# Patient Record
Sex: Female | Born: 1940 | Race: Black or African American | Hispanic: No | Smoking: Former smoker
Health system: Southern US, Community
[De-identification: ages and names within clinical notes are randomized; demographics above are authoritative.]

## PROBLEM LIST (undated history)

## (undated) DIAGNOSIS — H269 Unspecified cataract: Secondary | ICD-10-CM

## (undated) DIAGNOSIS — I771 Stricture of artery: Secondary | ICD-10-CM

## (undated) DIAGNOSIS — G894 Chronic pain syndrome: Secondary | ICD-10-CM

## (undated) DIAGNOSIS — M48062 Spinal stenosis, lumbar region with neurogenic claudication: Secondary | ICD-10-CM

## (undated) DIAGNOSIS — N2581 Secondary hyperparathyroidism of renal origin: Secondary | ICD-10-CM

## (undated) DIAGNOSIS — D631 Anemia in chronic kidney disease: Secondary | ICD-10-CM

## (undated) DIAGNOSIS — I5189 Other ill-defined heart diseases: Secondary | ICD-10-CM

## (undated) DIAGNOSIS — I5032 Chronic diastolic (congestive) heart failure: Secondary | ICD-10-CM

## (undated) DIAGNOSIS — I701 Atherosclerosis of renal artery: Secondary | ICD-10-CM

## (undated) DIAGNOSIS — N189 Chronic kidney disease, unspecified: Secondary | ICD-10-CM

## (undated) DIAGNOSIS — I10 Essential (primary) hypertension: Secondary | ICD-10-CM

## (undated) DIAGNOSIS — K219 Gastro-esophageal reflux disease without esophagitis: Secondary | ICD-10-CM

## (undated) DIAGNOSIS — I2510 Atherosclerotic heart disease of native coronary artery without angina pectoris: Secondary | ICD-10-CM

## (undated) DIAGNOSIS — M48061 Spinal stenosis, lumbar region without neurogenic claudication: Secondary | ICD-10-CM

## (undated) DIAGNOSIS — M47812 Spondylosis without myelopathy or radiculopathy, cervical region: Secondary | ICD-10-CM

## (undated) DIAGNOSIS — E785 Hyperlipidemia, unspecified: Secondary | ICD-10-CM

## (undated) DIAGNOSIS — N183 Chronic kidney disease, stage 3 unspecified: Secondary | ICD-10-CM

## (undated) DIAGNOSIS — I6529 Occlusion and stenosis of unspecified carotid artery: Secondary | ICD-10-CM

## (undated) DIAGNOSIS — F419 Anxiety disorder, unspecified: Secondary | ICD-10-CM

## (undated) DIAGNOSIS — R0602 Shortness of breath: Secondary | ICD-10-CM

## (undated) DIAGNOSIS — M169 Osteoarthritis of hip, unspecified: Secondary | ICD-10-CM

## (undated) DIAGNOSIS — Z7902 Long term (current) use of antithrombotics/antiplatelets: Secondary | ICD-10-CM

## (undated) DIAGNOSIS — I739 Peripheral vascular disease, unspecified: Secondary | ICD-10-CM

## (undated) DIAGNOSIS — I714 Abdominal aortic aneurysm, without rupture, unspecified: Secondary | ICD-10-CM

## (undated) DIAGNOSIS — I469 Cardiac arrest, cause unspecified: Secondary | ICD-10-CM

## (undated) HISTORY — DX: Anxiety disorder, unspecified: F41.9

## (undated) HISTORY — DX: Essential (primary) hypertension: I10

## (undated) HISTORY — DX: Chronic kidney disease, stage 3 unspecified: N18.30

## (undated) HISTORY — DX: Secondary hyperparathyroidism of renal origin: N25.81

## (undated) HISTORY — PX: TONSILLECTOMY AND ADENOIDECTOMY: SUR1326

## (undated) HISTORY — DX: Atherosclerotic heart disease of native coronary artery without angina pectoris: I25.10

## (undated) HISTORY — PX: CAROTID ENDARTERECTOMY: SUR193

## (undated) HISTORY — PX: CHOLECYSTECTOMY: SHX55

## (undated) HISTORY — DX: Anemia in chronic kidney disease: N18.9

## (undated) HISTORY — DX: Hyperlipidemia, unspecified: E78.5

## (undated) HISTORY — DX: Stricture of artery: I77.1

## (undated) HISTORY — PX: TOE AMPUTATION: SHX809

## (undated) HISTORY — DX: Occlusion and stenosis of unspecified carotid artery: I65.29

## (undated) HISTORY — DX: Chronic kidney disease, stage 3 (moderate): N18.3

## (undated) HISTORY — DX: Cardiac arrest, cause unspecified: I46.9

## (undated) HISTORY — DX: Anemia in chronic kidney disease: D63.1

## (undated) HISTORY — PX: CAROTID ARTERY ANGIOPLASTY: SHX1300

## (undated) HISTORY — DX: Spinal stenosis, lumbar region without neurogenic claudication: M48.061

## (undated) HISTORY — PX: TOTAL HIP ARTHROPLASTY: SHX124

## (undated) HISTORY — PX: CYSTOSCOPY WITH STENT PLACEMENT: SHX5790

## (undated) HISTORY — DX: Atherosclerosis of renal artery: I70.1

---

## 1974-10-25 HISTORY — PX: ABDOMINAL HYSTERECTOMY: SHX81

## 1998-10-25 HISTORY — PX: JOINT REPLACEMENT: SHX530

## 1998-10-25 HISTORY — PX: CORONARY ANGIOPLASTY WITH STENT PLACEMENT: SHX49

## 1998-10-25 HISTORY — PX: CORONARY ARTERY BYPASS GRAFT: SHX141

## 1999-10-26 HISTORY — PX: LAPAROSCOPIC CHOLECYSTECTOMY: SUR755

## 2000-03-29 DIAGNOSIS — Z951 Presence of aortocoronary bypass graft: Secondary | ICD-10-CM

## 2000-03-29 HISTORY — DX: Presence of aortocoronary bypass graft: Z95.1

## 2000-03-29 HISTORY — PX: CORONARY ARTERY BYPASS GRAFT: SHX141

## 2002-11-19 HISTORY — PX: LEFT HEART CATH AND CORS/GRAFTS ANGIOGRAPHY: CATH118250

## 2003-09-09 HISTORY — PX: CAROTID ENDARTERECTOMY: SUR193

## 2003-09-15 ENCOUNTER — Other Ambulatory Visit: Payer: Self-pay

## 2003-09-16 ENCOUNTER — Other Ambulatory Visit: Payer: Self-pay

## 2003-09-17 HISTORY — PX: LEFT HEART CATH AND CORS/GRAFTS ANGIOGRAPHY: CATH118250

## 2003-10-26 HISTORY — PX: TOTAL HIP ARTHROPLASTY: SHX124

## 2004-12-14 ENCOUNTER — Ambulatory Visit: Payer: Self-pay | Admitting: *Deleted

## 2005-03-17 ENCOUNTER — Other Ambulatory Visit: Payer: Self-pay

## 2005-03-17 ENCOUNTER — Emergency Department: Payer: Self-pay | Admitting: Unknown Physician Specialty

## 2005-06-24 ENCOUNTER — Ambulatory Visit (HOSPITAL_COMMUNITY): Admission: RE | Admit: 2005-06-24 | Discharge: 2005-06-24 | Payer: Self-pay | Admitting: Cardiovascular Disease

## 2005-12-23 ENCOUNTER — Other Ambulatory Visit: Payer: Self-pay

## 2005-12-23 ENCOUNTER — Emergency Department: Payer: Self-pay | Admitting: Unknown Physician Specialty

## 2006-10-07 ENCOUNTER — Ambulatory Visit: Payer: Self-pay | Admitting: Internal Medicine

## 2007-06-22 ENCOUNTER — Ambulatory Visit: Payer: Self-pay | Admitting: Gastroenterology

## 2007-12-28 ENCOUNTER — Ambulatory Visit: Payer: Self-pay | Admitting: Internal Medicine

## 2008-01-17 ENCOUNTER — Ambulatory Visit: Payer: Self-pay | Admitting: Internal Medicine

## 2008-02-24 ENCOUNTER — Other Ambulatory Visit: Payer: Self-pay

## 2008-02-24 ENCOUNTER — Emergency Department: Payer: Self-pay | Admitting: Emergency Medicine

## 2008-03-25 ENCOUNTER — Ambulatory Visit: Payer: Self-pay | Admitting: Vascular Surgery

## 2008-05-30 ENCOUNTER — Ambulatory Visit: Payer: Self-pay | Admitting: Unknown Physician Specialty

## 2008-05-30 ENCOUNTER — Other Ambulatory Visit: Payer: Self-pay

## 2008-06-06 ENCOUNTER — Inpatient Hospital Stay: Payer: Self-pay | Admitting: Unknown Physician Specialty

## 2008-07-08 ENCOUNTER — Ambulatory Visit: Payer: Self-pay | Admitting: Physician Assistant

## 2008-08-12 ENCOUNTER — Ambulatory Visit: Payer: Self-pay | Admitting: Internal Medicine

## 2009-03-13 ENCOUNTER — Ambulatory Visit: Payer: Self-pay | Admitting: Internal Medicine

## 2009-06-13 ENCOUNTER — Ambulatory Visit: Payer: Self-pay | Admitting: Internal Medicine

## 2010-06-11 ENCOUNTER — Emergency Department: Payer: Self-pay | Admitting: Emergency Medicine

## 2010-07-14 ENCOUNTER — Ambulatory Visit: Payer: Self-pay | Admitting: Internal Medicine

## 2010-08-20 ENCOUNTER — Ambulatory Visit: Payer: Self-pay | Admitting: Vascular Surgery

## 2011-03-11 ENCOUNTER — Ambulatory Visit: Payer: Self-pay | Admitting: Internal Medicine

## 2011-03-25 ENCOUNTER — Ambulatory Visit: Payer: Self-pay | Admitting: Pain Medicine

## 2011-04-02 ENCOUNTER — Ambulatory Visit: Payer: Self-pay | Admitting: Pain Medicine

## 2011-08-10 ENCOUNTER — Ambulatory Visit: Payer: Self-pay | Admitting: Internal Medicine

## 2012-01-30 ENCOUNTER — Observation Stay: Payer: Self-pay | Admitting: Internal Medicine

## 2012-01-30 LAB — COMPREHENSIVE METABOLIC PANEL
Albumin: 3.9 g/dL (ref 3.4–5.0)
Alkaline Phosphatase: 79 U/L (ref 50–136)
Anion Gap: 6 — ABNORMAL LOW (ref 7–16)
BUN: 19 mg/dL — ABNORMAL HIGH (ref 7–18)
Bilirubin,Total: 0.5 mg/dL (ref 0.2–1.0)
Calcium, Total: 9.2 mg/dL (ref 8.5–10.1)
Chloride: 98 mmol/L (ref 98–107)
Co2: 32 mmol/L (ref 21–32)
Creatinine: 1.23 mg/dL (ref 0.60–1.30)
EGFR (African American): 56 — ABNORMAL LOW
EGFR (Non-African Amer.): 46 — ABNORMAL LOW
Glucose: 91 mg/dL (ref 65–99)
Osmolality: 274 (ref 275–301)
Potassium: 3.7 mmol/L (ref 3.5–5.1)
SGOT(AST): 31 U/L (ref 15–37)
SGPT (ALT): 22 U/L
Sodium: 136 mmol/L (ref 136–145)
Total Protein: 8.2 g/dL (ref 6.4–8.2)

## 2012-01-30 LAB — CBC
HCT: 34.8 % — ABNORMAL LOW (ref 35.0–47.0)
HGB: 11.7 g/dL — ABNORMAL LOW (ref 12.0–16.0)
MCH: 29.1 pg (ref 26.0–34.0)
MCHC: 33.5 g/dL (ref 32.0–36.0)
MCV: 87 fL (ref 80–100)
Platelet: 284 10*3/uL (ref 150–440)
RBC: 4.02 10*6/uL (ref 3.80–5.20)
RDW: 13.7 % (ref 11.5–14.5)
WBC: 7.5 10*3/uL (ref 3.6–11.0)

## 2012-01-30 LAB — TROPONIN I
Troponin-I: 0.05 ng/mL
Troponin-I: 0.05 ng/mL
Troponin-I: 0.04 ng/mL

## 2012-01-30 LAB — CK TOTAL AND CKMB (NOT AT ARMC)
CK, Total: 218 U/L — ABNORMAL HIGH (ref 21–215)
CK, Total: 185 U/L (ref 21–215)
CK-MB: 2.5 ng/mL (ref 0.5–3.6)
CK-MB: 1.8 ng/mL (ref 0.5–3.6)

## 2012-05-08 ENCOUNTER — Emergency Department: Payer: Self-pay | Admitting: Emergency Medicine

## 2012-06-02 ENCOUNTER — Ambulatory Visit: Payer: Self-pay | Admitting: Neurology

## 2012-08-22 ENCOUNTER — Ambulatory Visit: Payer: Self-pay | Admitting: Internal Medicine

## 2013-08-23 ENCOUNTER — Ambulatory Visit: Payer: Self-pay | Admitting: Internal Medicine

## 2013-10-25 HISTORY — PX: TOTAL HIP ARTHROPLASTY: SHX124

## 2013-11-06 ENCOUNTER — Ambulatory Visit: Payer: Self-pay | Admitting: Vascular Surgery

## 2013-11-06 LAB — CREATININE, SERUM
Creatinine: 1.06 mg/dL (ref 0.60–1.30)
EGFR (African American): >60
EGFR (Non-African Amer.): 53 — ABNORMAL LOW

## 2013-11-06 LAB — BUN: BUN: 15 mg/dL (ref 7–18)

## 2013-12-18 ENCOUNTER — Ambulatory Visit: Payer: Self-pay | Admitting: Adult Health

## 2013-12-18 ENCOUNTER — Telehealth: Payer: Self-pay | Admitting: Adult Health

## 2013-12-18 NOTE — Telephone Encounter (Signed)
Wrong dob  Pt r/s to 3/5

## 2013-12-18 NOTE — Telephone Encounter (Signed)
Snowing, patient is unsure if she can make it safely to the office. She does not know exactly where office is located and she does not feel like she could drive in the snow and find the office also. Please contact her at number provided to reschedule the appointment.

## 2013-12-23 HISTORY — PX: RENAL ARTERY ANGIOPLASTY: SHX2317

## 2013-12-25 ENCOUNTER — Ambulatory Visit: Payer: Self-pay | Admitting: Vascular Surgery

## 2013-12-25 LAB — BASIC METABOLIC PANEL
Anion Gap: 3 — ABNORMAL LOW (ref 7–16)
BUN: 15 mg/dL (ref 7–18)
Calcium, Total: 9.1 mg/dL (ref 8.5–10.1)
Chloride: 104 mmol/L (ref 98–107)
Co2: 31 mmol/L (ref 21–32)
Creatinine: 1.34 mg/dL — ABNORMAL HIGH (ref 0.60–1.30)
EGFR (African American): 46 — ABNORMAL LOW
EGFR (Non-African Amer.): 40 — ABNORMAL LOW
Glucose: 111 mg/dL — ABNORMAL HIGH (ref 65–99)
Osmolality: 277 (ref 275–301)
Potassium: 3.8 mmol/L (ref 3.5–5.1)
Sodium: 138 mmol/L (ref 136–145)

## 2013-12-27 ENCOUNTER — Encounter: Payer: Self-pay | Admitting: Adult Health

## 2013-12-27 ENCOUNTER — Ambulatory Visit (INDEPENDENT_AMBULATORY_CARE_PROVIDER_SITE_OTHER): Payer: Medicare Other | Admitting: Adult Health

## 2013-12-27 ENCOUNTER — Encounter (INDEPENDENT_AMBULATORY_CARE_PROVIDER_SITE_OTHER): Payer: Self-pay

## 2013-12-27 VITALS — BP 152/76 | HR 87 | Temp 98.6°F | Resp 16 | Ht 61.0 in | Wt 169.0 lb

## 2013-12-27 DIAGNOSIS — M25552 Pain in left hip: Secondary | ICD-10-CM

## 2013-12-27 DIAGNOSIS — Z23 Encounter for immunization: Secondary | ICD-10-CM

## 2013-12-27 DIAGNOSIS — M25551 Pain in right hip: Secondary | ICD-10-CM

## 2013-12-27 DIAGNOSIS — M5417 Radiculopathy, lumbosacral region: Secondary | ICD-10-CM | POA: Insufficient documentation

## 2013-12-27 DIAGNOSIS — M25559 Pain in unspecified hip: Secondary | ICD-10-CM

## 2013-12-27 DIAGNOSIS — M545 Low back pain, unspecified: Secondary | ICD-10-CM

## 2013-12-27 DIAGNOSIS — I10 Essential (primary) hypertension: Secondary | ICD-10-CM | POA: Insufficient documentation

## 2013-12-27 DIAGNOSIS — I359 Nonrheumatic aortic valve disorder, unspecified: Secondary | ICD-10-CM

## 2013-12-27 DIAGNOSIS — I358 Other nonrheumatic aortic valve disorders: Secondary | ICD-10-CM

## 2013-12-27 MED ORDER — HYDROCODONE-ACETAMINOPHEN 5-325 MG PO TABS: 1.0000 | ORAL_TABLET | Freq: Four times a day (QID) | ORAL | Status: DC | PRN

## 2013-12-27 NOTE — Progress Notes (Signed)
Pre visit review using our clinic review tool, if applicable. No additional management support is needed unless otherwise documented below in the visit note. 

## 2013-12-27 NOTE — Patient Instructions (Addendum)
   Thank you for choosing [place] at [facility] for your health care needs.  Monitor your blood pressure in the morning and write it down.  Please go to our [facility] office for your xrays at your earliest convenience. I am sending you for an xray of your lumbar spine and both hips.  You were given the pneumonia vaccine today (Prevnar).  Norco 1 tablet every 6 hours as needed for low back pain, hip pain  Return in 2 weeks.

## 2013-12-28 ENCOUNTER — Ambulatory Visit (INDEPENDENT_AMBULATORY_CARE_PROVIDER_SITE_OTHER)
Admission: RE | Admit: 2013-12-28 | Discharge: 2013-12-28 | Disposition: A | Discharge status: Home / Self Care | Payer: Medicare Other | Source: Ambulatory Visit | Attending: Adult Health | Admitting: Adult Health

## 2013-12-28 ENCOUNTER — Telehealth: Payer: Self-pay | Admitting: *Deleted

## 2013-12-28 ENCOUNTER — Ambulatory Visit
Admission: RE | Admit: 2013-12-28 | Discharge: 2013-12-28 | Disposition: A | Discharge status: Home / Self Care | Payer: Medicare Other | Source: Ambulatory Visit | Attending: Adult Health | Admitting: Adult Health

## 2013-12-28 ENCOUNTER — Encounter: Payer: Self-pay | Admitting: Adult Health

## 2013-12-28 ENCOUNTER — Other Ambulatory Visit: Payer: Self-pay | Admitting: Adult Health

## 2013-12-28 ENCOUNTER — Telehealth: Payer: Self-pay | Admitting: Adult Health

## 2013-12-28 DIAGNOSIS — M25551 Pain in right hip: Secondary | ICD-10-CM

## 2013-12-28 DIAGNOSIS — I358 Other nonrheumatic aortic valve disorders: Secondary | ICD-10-CM | POA: Insufficient documentation

## 2013-12-28 DIAGNOSIS — I359 Nonrheumatic aortic valve disorder, unspecified: Secondary | ICD-10-CM | POA: Insufficient documentation

## 2013-12-28 DIAGNOSIS — M25552 Pain in left hip: Secondary | ICD-10-CM

## 2013-12-28 DIAGNOSIS — M25559 Pain in unspecified hip: Secondary | ICD-10-CM

## 2013-12-28 DIAGNOSIS — Z23 Encounter for immunization: Secondary | ICD-10-CM | POA: Insufficient documentation

## 2013-12-28 DIAGNOSIS — M545 Low back pain, unspecified: Secondary | ICD-10-CM

## 2013-12-28 NOTE — Telephone Encounter (Signed)
Pt states when she had her hydrocodone filled, the pharmacist asked her if her doctor was aware that she was using the Fentanyl patch. Pt states the pharmacist did not tell her to not take the hydrocodone, but she is worried to take it since he did say something about it. Wants to confirm it is ok to use the hydrocodone while on patch.

## 2013-12-28 NOTE — Telephone Encounter (Signed)
Yes I am aware she is on the Duragesic patch. The vicodin is 1 tablet every 6 hours as needed for pain. Duragesic is a long acting pain medication but you can still experience pain called breakthrough pain. If she does not have any pain she is not to take the vicodin. I gave her the prescription because she was having hip and back pain.

## 2013-12-28 NOTE — Progress Notes (Signed)
Patient ID: Sabrina [name], female   DOB: 07/10/1941, 73 y.o.   MRN: [id number]    Subjective:    Patient ID: Sabrina [name], female    DOB: 1940/12/22, 73 y.o.   MRN: [id number]  HPI  Ms Sabrina [name] is a pleasant 73 y/o female who presents to clinic to establish care. She was previously followed by Dr. Brynda Greathouse. Also followed by Dr. Clayborn Bigness for cardiology. She has several concerns that she would like to address. She has a hx of HTN, renal artery stenosis s/p bilateral stent placement. She reports taking too many blood pressure medications and would like to come off some. She reports poor circulation in B. LE which results in painful legs. 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 Reports increasing pain of her hips with the right side being worse than the left. Denies having a bone scan of films of her lumbar spine to evaluate potential cause of her pain. She is on a Duragesic patch 50 mcg from chronic pain but would like to be weaned off this patch.       Past Medical History  Diagnosis Date  . Anxiety   . Hypertension   . Hyperlipidemia   . Renal artery stenosis   . Carotid artery stenosis      Past Surgical History  Procedure Laterality Date  . Cholecystectomy    . Tonsillectomy and adenoidectomy    . Abdominal hysterectomy  1976  . Total hip arthroplasty Left   . Carotid artery angioplasty Left   . Coronary angioplasty with stent placement  2000  . Toe amputation Right     small toe  . Cystoscopy with stent placement Bilateral      Family History  Problem Relation Age of Onset  . Stroke Mother   . Hypertension Mother   . Diabetes Mother   . Hypertension Father   . Heart disease Sister     MI     History   Social History  . Marital Status: Married    Spouse Name: N/A    Number of Children: N/A  . Years of Education: N/A   Occupational History  . Not on file.   Social History Main Topics  . Smoking status: Former Smoker    Quit  date: 10/25/1998  . Smokeless tobacco: Never Used  . Alcohol Use: No  . Drug Use: No  . Sexual Activity: Not on file   Other Topics Concern  . Not on file   Social History Narrative  . No narrative on file        No current outpatient prescriptions on file prior to visit.   No current facility-administered medications on file prior to visit.     Review of Systems  Constitutional: Negative.   HENT: Negative.   Eyes: Negative.   Respiratory: Positive for shortness of breath (on exertion).   Cardiovascular: Positive for leg swelling.  Gastrointestinal: Negative.   Endocrine: Negative.   Genitourinary: Negative.   Musculoskeletal: Positive for arthralgias (bilateral hip pain R>L) and back pain.  Skin: Negative.   Allergic/Immunologic: Negative.   Neurological: Negative.   Hematological: Negative.   Psychiatric/Behavioral: Negative.        Objective:  BP 152/76  Pulse 87  Temp(Src) 98.6 F (37 C) (Oral)  Resp 16  Ht 5\' 1"  (1.549 m)  Wt 169 lb (76.658 kg)  BMI 31.95 kg/m2  SpO2 93%   Physical Exam  Constitutional: She is oriented to person, place, and  time. She appears well-developed and well-nourished. No distress.  HENT:  Head: Normocephalic and atraumatic.  Eyes: Conjunctivae and EOM are normal.  Neck: Normal range of motion. Neck supple. No tracheal deviation present.  Cardiovascular: Normal rate, regular rhythm and intact distal pulses.  Exam reveals no gallop and no friction rub.   Murmur heard. Systolic Murmur - suspect aortic stenosis  Pulmonary/Chest: Effort normal and breath sounds normal. No respiratory distress. She has no wheezes. She has no rales.  Musculoskeletal: She exhibits tenderness. She exhibits no edema.  Low back tenderness. Hip pain bilaterally. Pt's gait is guarded. Appears uncomfortable  Neurological: She is alert and oriented to person, place, and time.  Skin: Skin is warm and dry.  Psychiatric: She has a normal mood and affect.  Her behavior is normal. Judgment and thought content normal.       Assessment & Plan:   1. HTN (hypertension) RAS s/p bil stents. She is very upset about having to take multiple meds for b/p. Discussed that with recent renal stents her blood pressure may improve and we will be able to address this.  2. Hip pain, bilateral No injury reported. Has chronic back pain. Will have her go for hip xray. Start vicodin 1 every 6 hours as needed for pain. She is on Duragesic patch for chronic pain. - DG Hip Bilateral W/Pelvis; Future  3. Low back pain Reports no previous films of low back. Send for lumbar spine xray. She has not had bone density either. Will order this in the not to distant future. - DG Lumbar Spine Complete; Future  4. Aortic systolic murmur on examination Suspect AS. May further be contributing to her HTN. Request records from Dr. Clayborn Bigness  5. Need for vaccination with 13-polyvalent pneumococcal conjugate vaccine Prevnar given today - Pneumococcal conjugate vaccine 13-valent

## 2013-12-28 NOTE — Telephone Encounter (Signed)
emmi report mailed to patient ° °

## 2013-12-28 NOTE — Telephone Encounter (Signed)
Pt came by office on the way to her xray,  Pt was advised by Lorriane Shire of Raquel's response.

## 2014-01-04 ENCOUNTER — Other Ambulatory Visit: Payer: Self-pay | Admitting: Adult Health

## 2014-01-04 DIAGNOSIS — M25551 Pain in right hip: Secondary | ICD-10-CM

## 2014-01-10 ENCOUNTER — Encounter: Payer: Self-pay | Admitting: Adult Health

## 2014-01-10 ENCOUNTER — Ambulatory Visit (INDEPENDENT_AMBULATORY_CARE_PROVIDER_SITE_OTHER): Payer: Medicare Other | Admitting: Adult Health

## 2014-01-10 VITALS — BP 132/84 | HR 98 | Temp 98.8°F | Resp 14 | Wt 161.0 lb

## 2014-01-10 DIAGNOSIS — Z23 Encounter for immunization: Secondary | ICD-10-CM | POA: Diagnosis not present

## 2014-01-10 DIAGNOSIS — M25551 Pain in right hip: Secondary | ICD-10-CM

## 2014-01-10 DIAGNOSIS — M25559 Pain in unspecified hip: Secondary | ICD-10-CM

## 2014-01-10 DIAGNOSIS — M25552 Pain in left hip: Secondary | ICD-10-CM

## 2014-01-10 DIAGNOSIS — R899 Unspecified abnormal finding in specimens from other organs, systems and tissues: Secondary | ICD-10-CM

## 2014-01-10 DIAGNOSIS — R6889 Other general symptoms and signs: Secondary | ICD-10-CM | POA: Diagnosis not present

## 2014-01-10 DIAGNOSIS — I10 Essential (primary) hypertension: Secondary | ICD-10-CM

## 2014-01-10 MED ORDER — CLOPIDOGREL BISULFATE 75 MG PO TABS: 75.0000 mg | ORAL_TABLET | Freq: Once | ORAL | Status: DC

## 2014-01-10 MED ORDER — HYDROCODONE-ACETAMINOPHEN 5-325 MG PO TABS: 1.0000 | ORAL_TABLET | Freq: Four times a day (QID) | ORAL | Status: DC | PRN

## 2014-01-10 MED ORDER — FENTANYL 50 MCG/HR TD PT72: 50.0000 ug | MEDICATED_PATCH | TRANSDERMAL | Status: DC

## 2014-01-10 MED ORDER — CLONIDINE HCL 0.1 MG PO TABS: 0.1000 mg | ORAL_TABLET | Freq: Three times a day (TID) | ORAL | Status: DC

## 2014-01-10 MED ORDER — EZETIMIBE 10 MG PO TABS: 10.0000 mg | ORAL_TABLET | Freq: Every day | ORAL | Status: DC

## 2014-01-10 MED ORDER — ESOMEPRAZOLE MAGNESIUM 40 MG PO CPDR: 40.0000 mg | DELAYED_RELEASE_CAPSULE | Freq: Every day | ORAL | Status: DC

## 2014-01-10 NOTE — Patient Instructions (Addendum)
  Your blood pressure has improved significantly.  Hydralazine 50 mg - take this blood pressure medication 3 times a day as needed for systolic pressure above Q000111Q.  You received your tetanus/pertussis vaccine today.  I have sent refills to your pharmacy.  I have decreased the dose of your Fentanyl Patch to 50 mcg every 72 hours (3 days). I am trying to wean you off this medication as you requested. If your pain is not well controlled then we will go back to the 75 mcg.  I have also given you a prescription for Hydrocodone for pain. You can take 1 tablet every 6 hours as needed for pain.

## 2014-01-10 NOTE — Progress Notes (Signed)
Pre visit review using our clinic review tool, if applicable. No additional management support is needed unless otherwise documented below in the visit note. 

## 2014-01-10 NOTE — Progress Notes (Signed)
Patient ID: Sabrina [name], female   DOB: [birth year], 73 y.o.   MRN: [id number]   Subjective:    Patient ID: Wilmoth A Amos, female    DOB: 1941/10/20, 73 y.o.   MRN: [id number]  HPI  Pt is a pleasant 73 year old female who recently established care in clinic who presents for follow up for HTN. She recently underwent stenting of renal artery stenosis. She brings her blood pressure readings for the past 2 weeks and they are significantly improved and improving.  B/P Readings: 148/68  135/64  108/62  138/70 152/76  138/71  107/76  117/69 146/78  151/81  113/76  117/62 118/71  147/58  135/78  121/63  She is feeling well. Reports that she feels less sleepy during the day. She also reports that her hip pain is better controlled with the hydrocodone. She has been taking approximately 2 tablets daily. She has a pending referral to ortho.  She had abnormal TSH result back in 03/2013. Will recheck levels today  Past Medical History  Diagnosis Date  . CAD (coronary artery disease)   . Hypertension   . Hyperlipidemia   . Renal artery stenosis   . Carotid artery stenosis   . Anxiety   . 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 Subclavian arterial stenosis     Current Outpatient Prescriptions on File Prior to Visit  Medication Sig Dispense Refill  . amLODipine (NORVASC) 5 MG tablet Take 5 mg by mouth daily.       Marland Kitchen aspirin 81 MG tablet Take 81 mg by mouth daily.      . chlordiazePOXIDE (LIBRIUM) 25 MG capsule Take 25 mg by mouth every 12 (twelve) hours.       . cilostazol (PLETAL) 100 MG tablet Take 100 mg by mouth 2 (two) times daily.       . hydrALAZINE (APRESOLINE) 50 MG tablet Take 50 mg by mouth 3 (three) times daily.       Marland Kitchen labetalol (NORMODYNE) 200 MG tablet Take 200 mg by mouth once.      . Multiple Vitamin (MULTIVITAMIN WITH MINERALS) TABS tablet Take 1 tablet by mouth daily.      . Omega-3 Fatty Acids (FISH OIL) 1000 MG CAPS Take 1 capsule by mouth daily.       . traMADol (ULTRAM) 50 MG tablet Take 50 mg by mouth every 6 (six) hours as needed.       No current facility-administered medications on file prior to visit.     Review of Systems  Constitutional: Negative.   Respiratory: Negative.  Negative for chest tightness, shortness of breath and wheezing.   Cardiovascular: Negative.  Negative for chest pain and leg swelling.  Gastrointestinal: Negative.   Musculoskeletal:       Hip pain improved  Neurological: Negative.   Psychiatric/Behavioral: Negative.        Objective:  BP 132/84  Pulse 98  Temp(Src) 98.8 F (37.1 C) (Oral)  Resp 14  Wt 161 lb (73.029 kg)  SpO2 92%   Physical Exam  Constitutional: She is oriented to person, place, and time. No distress.  HENT:  Head: Normocephalic and atraumatic.  Cardiovascular: Normal rate, regular rhythm and normal heart sounds.  Exam reveals no gallop and no friction rub.   No murmur heard. Pulmonary/Chest: Effort normal and breath sounds normal. No respiratory distress. She has no wheezes. She has no rales.  Musculoskeletal: She exhibits tenderness.  Gait has improved. Pain better  controlled.  Neurological: She is alert and oriented to person, place, and time.  Skin: Skin is warm and dry.  Psychiatric: She has a normal mood and affect. Her behavior is normal. Judgment and thought content normal.       Assessment & Plan:   1. HTN (hypertension) Better controlled. We discussed using the hydralazine for systolic above Q000111Q. Having had stent for renal artery stenosis has also contributed to her improved b/p. She brought her b/p machine from home to compare against our machine and it is compatible. Check labs. Continue to follow. Follow up 3 months.  - Basic metabolic panel  2. Hip pain, bilateral She has appt with ortho. Pain has improved with Norco. Refill medication  3. Need for Tdap vaccination Administered today  - Tdap vaccine greater than or equal to 7yo IM  4. Abnormal  laboratory test Check TSH which was slightly elevated back in June 2014. - TSH

## 2014-01-11 ENCOUNTER — Encounter: Payer: Self-pay | Admitting: Emergency Medicine

## 2014-01-11 LAB — BASIC METABOLIC PANEL
BUN: 15 mg/dL (ref 6–23)
CO2: 31 mEq/L (ref 19–32)
Calcium: 9.7 mg/dL (ref 8.4–10.5)
Chloride: 97 mEq/L (ref 96–112)
Creatinine, Ser: 1.4 mg/dL — ABNORMAL HIGH (ref 0.4–1.2)
GFR: 48.75 mL/min — ABNORMAL LOW (ref >60.00)
Glucose, Bld: 78 mg/dL (ref 70–99)
Potassium: 4 mEq/L (ref 3.5–5.1)
Sodium: 135 mEq/L (ref 135–145)

## 2014-01-11 LAB — TSH: TSH: 5.74 u[IU]/mL — ABNORMAL HIGH (ref 0.35–5.50)

## 2014-01-15 ENCOUNTER — Other Ambulatory Visit: Payer: Self-pay | Admitting: Adult Health

## 2014-01-15 ENCOUNTER — Encounter: Payer: Self-pay | Admitting: Adult Health

## 2014-01-15 DIAGNOSIS — R7989 Other specified abnormal findings of blood chemistry: Secondary | ICD-10-CM

## 2014-01-21 ENCOUNTER — Encounter: Payer: Self-pay | Admitting: Emergency Medicine

## 2014-01-22 ENCOUNTER — Telehealth: Payer: Self-pay | Admitting: Adult Health

## 2014-01-22 NOTE — Telephone Encounter (Signed)
The patient is wanting labs done to check her cholesterol .

## 2014-01-23 NOTE — Telephone Encounter (Signed)
Advised pt, and she was ok with having the cholesterol checked at her next appt

## 2014-01-23 NOTE — Telephone Encounter (Signed)
She had her cholesterol done recently by her previous doctor and it was not bad. I did not order it this last visit. She will have that checked next visit.

## 2014-01-24 ENCOUNTER — Telehealth: Payer: Self-pay | Admitting: Adult Health

## 2014-01-24 NOTE — Telephone Encounter (Signed)
Pt left vm.  Needs help with referral appointments that have been scheduled.

## 2014-01-29 NOTE — Telephone Encounter (Signed)
Pt aware of all the apts scheduled.

## 2014-02-01 ENCOUNTER — Emergency Department: Payer: Self-pay | Admitting: Emergency Medicine

## 2014-02-07 ENCOUNTER — Telehealth: Payer: Self-pay | Admitting: Adult Health

## 2014-02-07 NOTE — Telephone Encounter (Signed)
States she saw Dr. Juleen China.    Wants to know if she had something done for her kidney then why did they find something in her heart.  States she was told that she has a narrowing of an artery in her heart and was been referred to see Dr. Clayborn Bigness.   States she has not heard back about an appointment to Dr. Clayborn Bigness and she has called their office with no answer and no return call.    States she is to have surgery on her hip and this is a hold up.    States she wants to know the results of her lipid profile.  Says she received results of what she had done here in the office but [facility]quel did not do certain tests because she had them done somewhere else.  Would like to get a copy of her labs from Dr. Juleen China and a copy of her lipid profile results from whoever did them.    Forgot whether she is to stop one of her blood pressure pills.  States she does not want to do anything unless Raquel tells her to.

## 2014-02-08 ENCOUNTER — Telehealth: Payer: Self-pay | Admitting: Adult Health

## 2014-02-08 NOTE — Telephone Encounter (Signed)
Pt stated she is having surgery may 26.  She stated she needed to see her heart dr and wanted to see if you could get her a referral to that appointment Dr Clayborn Bigness Please call pt with date and time

## 2014-02-08 NOTE — Telephone Encounter (Signed)
Pt came in today  Wanting to let you know that she is changing her pharmacy To  [facility] on [address]

## 2014-02-09 ENCOUNTER — Other Ambulatory Visit: Payer: Self-pay | Admitting: Adult Health

## 2014-02-09 DIAGNOSIS — I771 Stricture of artery: Secondary | ICD-10-CM

## 2014-02-09 NOTE — Telephone Encounter (Signed)
I spoke with Sabrina [name] over the telephone. She was very nervous not fully understanding what she needed to do following the visit with [facility] where they told her she needed an intervention for subclavian artery stenosis. Spoke with her at length. Explained the findings and placed referral for Dr. Clayborn Bigness who is her cardiologist. She is concerned because she is having hip surgery at the end of May and needs this addressed prior. Pt thanked me for the call and time spent.

## 2014-02-11 ENCOUNTER — Telehealth: Payer: Self-pay | Admitting: Emergency Medicine

## 2014-02-11 NOTE — Telephone Encounter (Signed)
Patient stated her head is hurting, thinking its due to her BP being up and down. Please advise

## 2014-02-11 NOTE — Telephone Encounter (Signed)
Spoke with pt. At 01/10/14 visit, pt was advised to take Hydralazine tid prn for systolic > Q000111Q. Since that time, her BPs have been increasing. When she takes the Hydralazine, it will lower her BP for 2-3 hours, then be elevated again. Readings for yesterday: 7:15 AM 178/82, took hydralazine, one hour after BP 130/72. At 1:00 PM 192/89, took Hydralazine, at 2:30 167/82, then 5:25 PM BP 139/76. BP this AM was 158/82, has not taken Hydralazine yet today, will recheck her BP in 1 hour. Confirmed she is still taking Amlodipine 5 mg daily, Clonidine 0.1 mg tid, and labetalol 200 mg at bedtime. Pt denies chest pain, states she does have a daily headache, this has been occuring since Fentanyl patch was reduced in March. Takes 1/2 hydrocodone when she has pain.

## 2014-02-11 NOTE — Telephone Encounter (Signed)
Pt notified and verbalized understanding.

## 2014-02-11 NOTE — Telephone Encounter (Signed)
Please have pt take the hydralazine tid. Maybe she can stagger it with the clonidine so that she doesn't take them at the same time. She is also anxious about the appointment with Dr. Clayborn Bigness and her upcoming hip surgery which may be driving her blood pressure up.

## 2014-02-13 ENCOUNTER — Ambulatory Visit (INDEPENDENT_AMBULATORY_CARE_PROVIDER_SITE_OTHER): Payer: Medicare Other | Admitting: Adult Health

## 2014-02-13 ENCOUNTER — Encounter: Payer: Self-pay | Admitting: Adult Health

## 2014-02-13 VITALS — BP 150/92 | HR 69 | Temp 98.5°F | Resp 14 | Wt 158.0 lb

## 2014-02-13 DIAGNOSIS — R51 Headache: Secondary | ICD-10-CM

## 2014-02-13 MED ORDER — FENTANYL 50 MCG/HR TD PT72: 50.0000 ug | MEDICATED_PATCH | TRANSDERMAL | Status: DC

## 2014-02-13 MED ORDER — TRAMADOL HCL 50 MG PO TABS: 50.0000 mg | ORAL_TABLET | Freq: Four times a day (QID) | ORAL | Status: DC | PRN

## 2014-02-13 MED ORDER — CLOPIDOGREL BISULFATE 75 MG PO TABS: 75.0000 mg | ORAL_TABLET | Freq: Once | ORAL | Status: DC

## 2014-02-13 MED ORDER — OXYCODONE HCL 5 MG PO TABS: 5.0000 mg | ORAL_TABLET | Freq: Three times a day (TID) | ORAL | Status: DC | PRN

## 2014-02-13 MED ORDER — CHLORDIAZEPOXIDE HCL 25 MG PO CAPS: 25.0000 mg | ORAL_CAPSULE | Freq: Every day | ORAL | Status: DC | PRN

## 2014-02-13 NOTE — Progress Notes (Signed)
Pre visit review using our clinic review tool, if applicable. No additional management support is needed unless otherwise documented below in the visit note. 

## 2014-02-13 NOTE — Progress Notes (Signed)
   Subjective:    Patient ID: Sabrina [name], female    DOB: [birth year], 73 y.o.   MRN: [id number]  Headache    Pt is a pleasant 73 y/o female who presents to clinic with headache. She was recently started on hydrocodone for breakthrough pain of her hip. She requested to decrease the duragesic patch from 75 mcg to 50 mcg and therefore we added the Norco to help with her pain. She thought that the headache was coming from her blood pressure but this has been better controlled. She has an appointment with Dr. Clayborn Bigness on Tuesday to evaluate whether patient needs intervention of the subclavian artery. She is scheduled for hip surgery in May. Pt is nervous about all that is going on.  Past Medical History  Diagnosis Date  . CAD (coronary artery disease)   . Hypertension   . Hyperlipidemia   . Renal artery stenosis   . Carotid artery stenosis   . Anxiety   . 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 Subclavian arterial stenosis     Review of Systems  Respiratory: Negative.   Cardiovascular: Negative for chest pain, palpitations and leg swelling.  Genitourinary: Negative.   Musculoskeletal: Negative.   Neurological: Positive for headaches.  Psychiatric/Behavioral: Negative for behavioral problems, confusion and agitation. The patient is nervous/anxious.        Objective:   Physical Exam  Constitutional: She is oriented to person, place, and time. She appears well-developed and well-nourished. No distress.  Cardiovascular: Normal rate and regular rhythm.  Exam reveals no gallop.   Murmur heard. Pulmonary/Chest: Effort normal and breath sounds normal. No respiratory distress. She has no wheezes. She has no rales.  Musculoskeletal:  Considerable hip pain. Rate 6/10 - 8/10  Neurological: She is alert and oriented to person, place, and time.  Psychiatric: She has a normal mood and affect. Her behavior is normal. Judgment and thought content normal.       Assessment &  Plan:   1. Headache(784.0) Suspect that her headaches are coming from hydrocodone which is a common side effect that affects some people. Given her ongoing pain of the hip we will keep the duragesic at 50 mcg and add oxycodone every 8 hours as needed for BTP.  Note, greater than 30 min were spent in addressing her headache and discussing a change to her pain management.

## 2014-02-15 ENCOUNTER — Telehealth: Payer: Self-pay | Admitting: Adult Health

## 2014-02-15 NOTE — Telephone Encounter (Signed)
Pt came in to office asking to give a message to [place]:   Do not change drug stores until she can call back up here.  When she was here the other day she told us to call on Friday but she has decided to stay with [facility] until further notice.  [facility] and [facility] and see what the price difference is.  Please continue to use [facility] for the time being.

## 2014-03-04 ENCOUNTER — Other Ambulatory Visit: Payer: Self-pay | Admitting: Adult Health

## 2014-03-04 MED ORDER — LABETALOL HCL 200 MG PO TABS: 200.0000 mg | ORAL_TABLET | Freq: Two times a day (BID) | ORAL | Status: DC

## 2014-03-07 ENCOUNTER — Encounter: Payer: Self-pay | Admitting: Adult Health

## 2014-03-07 ENCOUNTER — Other Ambulatory Visit: Payer: Self-pay | Admitting: Adult Health

## 2014-03-07 MED ORDER — LISINOPRIL 10 MG PO TABS: 10.0000 mg | ORAL_TABLET | Freq: Every day | ORAL | Status: DC

## 2014-03-14 ENCOUNTER — Telehealth: Payer: Self-pay | Admitting: Adult Health

## 2014-03-14 NOTE — Telephone Encounter (Signed)
Patient stopped by office to request a refill on a rx.- Fentanyl Patch. Patient stated that she has already spoken with the pharmacy and she was told that she had to call the doctor.

## 2014-03-14 NOTE — Telephone Encounter (Signed)
Patient requesting Rx refill for Fentanyl patches. Last Rx printed 02/13/14. Please advice.

## 2014-03-14 NOTE — Telephone Encounter (Signed)
Patient also requesting a 3 month supply. Please advise.

## 2014-03-15 ENCOUNTER — Other Ambulatory Visit: Payer: Self-pay | Admitting: Adult Health

## 2014-03-15 MED ORDER — FENTANYL 50 MCG/HR TD PT72
50.0000 ug | MEDICATED_PATCH | TRANSDERMAL | Status: DC
Start: 2014-03-15 — End: 2014-04-29

## 2014-03-15 MED ORDER — FENTANYL 50 MCG/HR TD PT72: 50.0000 ug | MEDICATED_PATCH | TRANSDERMAL | Status: DC

## 2014-03-15 NOTE — Telephone Encounter (Signed)
Patient notified Rx ready for pick up. Patient verbalized understanding.

## 2014-03-15 NOTE — Progress Notes (Signed)
Duragesic prescriptions x 3 (3 months)

## 2014-03-15 NOTE — Telephone Encounter (Signed)
Please call pt to pick up prescriptions

## 2014-03-27 ENCOUNTER — Ambulatory Visit: Payer: Self-pay | Admitting: Orthopedic Surgery

## 2014-03-27 LAB — CBC
HCT: 32.9 % — ABNORMAL LOW (ref 35.0–47.0)
HGB: 11.1 g/dL — ABNORMAL LOW (ref 12.0–16.0)
MCH: 28.7 pg (ref 26.0–34.0)
MCHC: 33.7 g/dL (ref 32.0–36.0)
MCV: 85 fL (ref 80–100)
Platelet: 258 10*3/uL (ref 150–440)
RBC: 3.86 10*6/uL (ref 3.80–5.20)
RDW: 13.7 % (ref 11.5–14.5)
WBC: 6.5 10*3/uL (ref 3.6–11.0)

## 2014-03-27 LAB — BASIC METABOLIC PANEL
Anion Gap: 4 — ABNORMAL LOW (ref 7–16)
BUN: 19 mg/dL — ABNORMAL HIGH (ref 7–18)
Calcium, Total: 9.4 mg/dL (ref 8.5–10.1)
Chloride: 104 mmol/L (ref 98–107)
Co2: 29 mmol/L (ref 21–32)
Creatinine: 1.34 mg/dL — ABNORMAL HIGH (ref 0.60–1.30)
EGFR (African American): 46 — ABNORMAL LOW
EGFR (Non-African Amer.): 39 — ABNORMAL LOW
Glucose: 79 mg/dL (ref 65–99)
Osmolality: 275 (ref 275–301)
Potassium: 4 mmol/L (ref 3.5–5.1)
Sodium: 137 mmol/L (ref 136–145)

## 2014-03-27 LAB — URINALYSIS, COMPLETE
Bilirubin,UR: NEGATIVE
Glucose,UR: 50 mg/dL (ref 0–75)
Ketone: NEGATIVE
Leukocyte Esterase: NEGATIVE
Nitrite: NEGATIVE
Ph: 5 (ref 4.5–8.0)
Protein: NEGATIVE
RBC,UR: NONE SEEN /HPF (ref 0–5)
Specific Gravity: 1.006 (ref 1.003–1.030)
Squamous Epithelial: <1
WBC UR: 1 /HPF (ref 0–5)

## 2014-03-27 LAB — PROTIME-INR
INR: 1
Prothrombin Time: 12.6 secs (ref 11.5–14.7)

## 2014-03-27 LAB — APTT: Activated PTT: 29.2 secs (ref 23.6–35.9)

## 2014-03-27 LAB — SEDIMENTATION RATE: Erythrocyte Sed Rate: 36 mm/hr — ABNORMAL HIGH (ref 0–30)

## 2014-03-27 LAB — MRSA PCR SCREENING

## 2014-03-28 LAB — URINE CULTURE

## 2014-04-04 DIAGNOSIS — M1611 Unilateral primary osteoarthritis, right hip: Secondary | ICD-10-CM | POA: Insufficient documentation

## 2014-04-08 ENCOUNTER — Telehealth: Payer: Self-pay | Admitting: *Deleted

## 2014-04-08 NOTE — Telephone Encounter (Signed)
Fax received from [facility] requesting Hydralazine HCL 50mg  Tab #90 one tablet TID PRN for BP.  Last refilled by Dr Lujean Amel on 2.4.15.  Last OV at [facility] 4.22.15.  Please advise refill.

## 2014-04-09 ENCOUNTER — Other Ambulatory Visit: Payer: Self-pay | Admitting: Adult Health

## 2014-04-09 MED ORDER — HYDRALAZINE HCL 50 MG PO TABS: 50.0000 mg | ORAL_TABLET | Freq: Three times a day (TID) | ORAL | Status: DC

## 2014-04-09 NOTE — Telephone Encounter (Signed)
Ok to refill 

## 2014-04-09 NOTE — Telephone Encounter (Signed)
Rx approved

## 2014-04-10 ENCOUNTER — Inpatient Hospital Stay: Payer: Self-pay | Admitting: Orthopedic Surgery

## 2014-04-11 LAB — HEMOGLOBIN
HGB: 7.1 g/dL — ABNORMAL LOW (ref 12.0–16.0)
HGB: 7.9 g/dL — ABNORMAL LOW (ref 12.0–16.0)

## 2014-04-11 LAB — BASIC METABOLIC PANEL
Anion Gap: 4 — ABNORMAL LOW (ref 7–16)
BUN: 23 mg/dL — ABNORMAL HIGH (ref 7–18)
Calcium, Total: 8 mg/dL — ABNORMAL LOW (ref 8.5–10.1)
Chloride: 104 mmol/L (ref 98–107)
Co2: 28 mmol/L (ref 21–32)
Creatinine: 1.49 mg/dL — ABNORMAL HIGH (ref 0.60–1.30)
EGFR (African American): 40 — ABNORMAL LOW
EGFR (Non-African Amer.): 35 — ABNORMAL LOW
Glucose: 89 mg/dL (ref 65–99)
Osmolality: 275 (ref 275–301)
Potassium: 4.2 mmol/L (ref 3.5–5.1)
Sodium: 136 mmol/L (ref 136–145)

## 2014-04-11 LAB — PLATELET COUNT: Platelet: 182 10*3/uL (ref 150–440)

## 2014-04-12 ENCOUNTER — Encounter: Payer: Self-pay | Admitting: Internal Medicine

## 2014-04-12 LAB — BASIC METABOLIC PANEL
Anion Gap: 6 — ABNORMAL LOW (ref 7–16)
BUN: 16 mg/dL (ref 7–18)
Calcium, Total: 8.5 mg/dL (ref 8.5–10.1)
Chloride: 101 mmol/L (ref 98–107)
Co2: 27 mmol/L (ref 21–32)
Creatinine: 1.12 mg/dL (ref 0.60–1.30)
EGFR (African American): 57 — ABNORMAL LOW
EGFR (Non-African Amer.): 49 — ABNORMAL LOW
Glucose: 90 mg/dL (ref 65–99)
Osmolality: 269 (ref 275–301)
Potassium: 4.4 mmol/L (ref 3.5–5.1)
Sodium: 134 mmol/L — ABNORMAL LOW (ref 136–145)

## 2014-04-12 LAB — TSH: Thyroid Stimulating Horm: 1.34 u[IU]/mL

## 2014-04-12 LAB — HEMATOCRIT: HCT: 24.7 % — ABNORMAL LOW (ref 35.0–47.0)

## 2014-04-12 LAB — PATHOLOGY REPORT

## 2014-04-12 LAB — HEMOGLOBIN
HGB: 8 g/dL — ABNORMAL LOW (ref 12.0–16.0)
HGB: 8.3 g/dL — ABNORMAL LOW (ref 12.0–16.0)
HGB: 7.7 g/dL — ABNORMAL LOW (ref 12.0–16.0)

## 2014-04-13 DIAGNOSIS — Z96649 Presence of unspecified artificial hip joint: Secondary | ICD-10-CM | POA: Diagnosis not present

## 2014-04-13 DIAGNOSIS — M25559 Pain in unspecified hip: Secondary | ICD-10-CM | POA: Diagnosis not present

## 2014-04-13 LAB — HEMOGLOBIN: HGB: 9.2 g/dL — ABNORMAL LOW (ref 12.0–16.0)

## 2014-04-29 ENCOUNTER — Encounter: Payer: Self-pay | Admitting: Adult Health

## 2014-04-29 ENCOUNTER — Ambulatory Visit (INDEPENDENT_AMBULATORY_CARE_PROVIDER_SITE_OTHER): Payer: Medicare Other | Admitting: Adult Health

## 2014-04-29 VITALS — BP 138/70 | HR 88 | Temp 98.5°F | Resp 16 | Wt 146.2 lb

## 2014-04-29 DIAGNOSIS — I10 Essential (primary) hypertension: Secondary | ICD-10-CM

## 2014-04-29 MED ORDER — FENTANYL 50 MCG/HR TD PT72: 50.0000 ug | MEDICATED_PATCH | TRANSDERMAL | Status: DC

## 2014-04-29 NOTE — Progress Notes (Signed)
Pre visit review using our clinic review tool, if applicable. No additional management support is needed unless otherwise documented below in the visit note. 

## 2014-04-29 NOTE — Progress Notes (Signed)
Subjective:    Patient ID: Sabrina [name], female    DOB: Mar 05, 1941, 73 y.o.   MRN: [id number]  HPI  Pleasant 73 yo female with a history of CAD, HTN, hyperlipidemia, renal artery stenosis s/p stent placement and recent right hip replacement presents today to discuss adjustments to her blood pressure medications. Indicates that her blood pressures have been good, even running low. Presented her blood pressure log with readings in the Q000111Q systolic and [id number] diastolic. There were a couple of spikes of 160 noted. Denies any changes in vision.   Had right total hip arthroplasty since last visit and indicates she has been doing well. Pain is much better controlled with nucynta.    Past Medical History  Diagnosis Date  . CAD (coronary artery disease)   . Hypertension   . Hyperlipidemia   . Renal artery stenosis   . Carotid artery stenosis   . Anxiety   . 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 Subclavian arterial stenosis   . Chronic kidney disease, stage III (moderate)   . Anemia of chronic kidney failure   . Secondary hyperparathyroidism     Past Surgical History  Procedure Laterality Date  . Cholecystectomy    . Tonsillectomy and adenoidectomy    . Abdominal hysterectomy  1976  . Total hip arthroplasty Left   . Carotid artery angioplasty Left   . Coronary angioplasty with stent placement  2000  . Toe amputation Right     small toe  . Cystoscopy with stent placement Bilateral   . Total hip arthroplasty Right 2015    Current Outpatient Prescriptions on File Prior to Visit  Medication Sig Dispense Refill  . aspirin 81 MG tablet Take 81 mg by mouth daily.      . chlordiazePOXIDE (LIBRIUM) 25 MG capsule Take 1 capsule (25 mg total) by mouth daily as needed for anxiety.  30 capsule  5  . cilostazol (PLETAL) 100 MG tablet Take 100 mg by mouth 2 (two) times daily.       . clopidogrel (PLAVIX) 75 MG tablet Take 1 tablet (75 mg total) by mouth once.  30  tablet  6  . esomeprazole (NEXIUM) 40 MG capsule Take 1 capsule (40 mg total) by mouth daily.  30 capsule  6  . ezetimibe (ZETIA) 10 MG tablet Take 1 tablet (10 mg total) by mouth daily.  30 tablet  6  . lisinopril (PRINIVIL,ZESTRIL) 10 MG tablet Take 1 tablet (10 mg total) by mouth daily.  30 tablet  3  . Multiple Vitamin (MULTIVITAMIN WITH MINERALS) TABS tablet Take 1 tablet by mouth daily.      . Omega-3 Fatty Acids (FISH OIL) 1000 MG CAPS Take 1 capsule by mouth daily.       No current facility-administered medications on file prior to visit.     Review of Systems  Constitutional: Negative.   HENT: Negative.   Eyes: Negative.   Respiratory: Negative.   Cardiovascular: Negative.   Gastrointestinal: Negative.   Endocrine: Negative.   Genitourinary: Negative.   Musculoskeletal: Negative.   Allergic/Immunologic: Negative.   Neurological: Negative.   Hematological: Negative.   Psychiatric/Behavioral: Negative.        Objective:  BP 138/70  Pulse 88  Temp(Src) 98.5 F (36.9 C) (Oral)  Resp 16  Wt 146 lb 4 oz (66.339 kg)  SpO2 97%   Physical Exam  Constitutional: She is oriented to person, place, and time. She appears well-developed and  well-nourished. No distress.  HENT:  Head: Normocephalic.  Cardiovascular: Normal rate and regular rhythm.   Murmur (aortic stenosis) heard. Pulmonary/Chest: Effort normal and breath sounds normal.  Neurological: She is alert and oriented to person, place, and time.  Skin: Skin is warm and dry.  Psychiatric: She has a normal mood and affect. Her behavior is normal. Judgment and thought content normal.     Assessment & Plan:   1. Essential hypertension Blood pressure average is below goal of <150/90 and actually running low (see HPI). Discontinue Amlodipine and clonidine. Reduce Labetolol to 100 mg BID. Continue lisinopril 10 mg daily and hydralazine 50 mg PO TID PRN. Has follow up appointment with Cardiology on 7/28. New medication  list printed and discussed with patient. Questions answered and patient indicated she understood. Note, greater than 30 min spent in face to face communication  reviewing patient's b/p log, medications, pt's history and determining changes to her blood pressure medication and in education pertaining to the changes. Follow up in 3 months.  - labetalol (NORMODYNE) 200 MG tablet; Take 100 mg by mouth 2 (two) times daily. - hydrALAZINE (APRESOLINE) 50 MG tablet; Take 50 mg by mouth 3 (three) times daily as needed.

## 2014-04-29 NOTE — Patient Instructions (Signed)
  You are doing well.  I have provided you with a prescription for Fentanyl Patch.  Changes to your blood pressure medication as we discussed and according to your new medication list which was provided to you.  Monitor your blood pressure daily and as needed.  Please call with any questions or concerns.

## 2014-04-30 ENCOUNTER — Telehealth: Payer: Self-pay | Admitting: Adult Health

## 2014-04-30 ENCOUNTER — Encounter: Payer: Self-pay | Admitting: Adult Health

## 2014-04-30 NOTE — Telephone Encounter (Signed)
Relevant patient education assigned to patient using Emmi. ° °

## 2014-05-07 ENCOUNTER — Telehealth: Payer: Self-pay | Admitting: Adult Health

## 2014-05-07 NOTE — Telephone Encounter (Signed)
Pt wanted to let Raquel that she is sweating a lot. Pt transferred to triage rn/msn

## 2014-05-08 NOTE — Telephone Encounter (Signed)
Please advise in Raquel's absence.

## 2014-05-08 NOTE — Telephone Encounter (Signed)
I cannot respond without more information.  It is a hot summer ,  Why does she consider sweating to be a medical problem?

## 2014-05-09 NOTE — Telephone Encounter (Signed)
Spoke with pt, she states she is not outside when this occurs.  She states when she gets up in the mornings she is unable to walk from her bedroom to the kitchen without sweating.  The only change is the

## 2014-05-09 NOTE — Telephone Encounter (Signed)
this can wait until raquel  returns bc she will need to be seen .  If she IS having chest pain associated with the sweating she needs to go to ER

## 2014-05-09 NOTE — Telephone Encounter (Signed)
Th only change is the Nucynta.  Please advise.

## 2014-06-09 ENCOUNTER — Telehealth: Payer: Self-pay | Admitting: Adult Health

## 2014-06-11 ENCOUNTER — Other Ambulatory Visit: Payer: Self-pay

## 2014-06-11 NOTE — Telephone Encounter (Signed)
Message sent to Dr. Derrel Nip for approval due to Raquel being out of office.

## 2014-06-11 NOTE — Telephone Encounter (Signed)
Patient sent a MyChart message requesting a refill on Fentanyl patches last refilled on 04/29/14 and Plavix last filled on 02/13/14. Last office visit 04/29/14. Raquel out of office. Is it ok to refill? Please advise.

## 2014-06-12 ENCOUNTER — Telehealth: Payer: Self-pay | Admitting: Adult Health

## 2014-06-12 ENCOUNTER — Other Ambulatory Visit: Payer: Self-pay

## 2014-06-12 MED ORDER — CLOPIDOGREL BISULFATE 75 MG PO TABS: 75.0000 mg | ORAL_TABLET | Freq: Once | ORAL | Status: DC

## 2014-06-12 MED ORDER — FENTANYL 50 MCG/HR TD PT72: 50.0000 ug | MEDICATED_PATCH | TRANSDERMAL | Status: DC

## 2014-06-12 NOTE — Telephone Encounter (Signed)
Attempted to call patient to clarify current medication no answer, waiting clarification from patient or Raquel.

## 2014-06-12 NOTE — Telephone Encounter (Signed)
Pharmacy called and ask if switching new therapy with Plavix or in addition to Pletal, Please advise.

## 2014-06-12 NOTE — Telephone Encounter (Signed)
Disregard pervious message. Dr. Derrel Nip approved refill and patient notified its ready to pick up.

## 2014-06-12 NOTE — Telephone Encounter (Signed)
also need clopidogrel bisulfat (Plavix) 75mg  along with Fentanyl 68mcg/hr I will stop by your office  to pick up scripts on Tuesday June 11 2014  I plan to have them filled at [address] [place] [address] [address] [place]  I will inform apothecary and Suzie Portela of this change on June 11 2014.    Is it ok to refill Sabrina [name]'s fentanyl patches and plavix?

## 2014-06-12 NOTE — Telephone Encounter (Signed)
Ok to refill,  printed rx in Raquel's absence

## 2014-06-12 NOTE — Telephone Encounter (Signed)
Please confirm with pt. The pletal is listed as historical provider. If she is still taking this please ask her who originally prescribed.

## 2014-06-12 NOTE — Telephone Encounter (Signed)
I do not know,  Not my patient.  Please forward to raquel rey

## 2014-06-14 ENCOUNTER — Telehealth: Payer: Self-pay | Admitting: Adult Health

## 2014-06-14 NOTE — Telephone Encounter (Signed)
Spoke with pt, she was prescribed Pletal by Dr. Brynda Greathouse, she has been taking that for a long time. In March /April, she was prescribed Plavix by [facility]. So, she has been taking both. She would like to know what she is supposed to be taking

## 2014-06-14 NOTE — Telephone Encounter (Signed)
Pt called in and stated apothecary pharmacy called her and stated [facility] called in a prescription for PLAVIX. Looked in chart and told pt was sent to [address] [place] [facility]. Pt stated would like all [facility] to [facility] on [address].

## 2014-06-15 NOTE — Telephone Encounter (Signed)
Continue plavix. Stop the plental

## 2014-06-15 NOTE — Telephone Encounter (Signed)
Pletal. Sorry spelled it wrong. Stop pletal, continue plavix

## 2014-06-17 ENCOUNTER — Telehealth: Payer: Self-pay | Admitting: *Deleted

## 2014-06-17 NOTE — Telephone Encounter (Signed)
Called patient, no answer. Left voicemail asking patient to return my call. Left callback number.

## 2014-06-17 NOTE — Telephone Encounter (Signed)
PA for Clopidogrel 75mg  tablet has been approved.  Authorization is good through 11.22.15 as per approval letter.  [facility] notified; left message on pts VM of approval

## 2014-06-17 NOTE — Telephone Encounter (Signed)
Pt came into the office today upset that no one has resolved her medication refill issue. I advised the pt that her Practitioner was not in the office last week and we had to wait til she returned.  Pt states "there's a Bitch in me and I dont want her to come out"  I further advised the pt that we are attempting to assist her.  I told the pt that I would first confirm with Raquel which medication she is to stop after that confirmation I would call the pharmacy to request them to discontinue the other medication and resubmit the correct medication to the insurance.  Prior to leaving, pt seemed pleased with plan.  Spoke with Raquel she states she wants pt to take Plavix and discontinue Pletal.  I then called [facility] spoke with Wells Guiles she states after resubmitting claim to insurance she still received a PA request due to drug interaction.  I then called [phone] spoke with Leanne Lovely, completed a verbal PA.  Gizzelle advised it will take up to 48-72 hours for decision.  Reference number [id number].

## 2014-06-20 DIAGNOSIS — I2510 Atherosclerotic heart disease of native coronary artery without angina pectoris: Secondary | ICD-10-CM | POA: Diagnosis not present

## 2014-06-20 DIAGNOSIS — K219 Gastro-esophageal reflux disease without esophagitis: Secondary | ICD-10-CM | POA: Diagnosis not present

## 2014-06-20 DIAGNOSIS — I10 Essential (primary) hypertension: Secondary | ICD-10-CM | POA: Diagnosis not present

## 2014-06-20 DIAGNOSIS — E785 Hyperlipidemia, unspecified: Secondary | ICD-10-CM | POA: Diagnosis not present

## 2014-06-21 DIAGNOSIS — N183 Chronic kidney disease, stage 3 unspecified: Secondary | ICD-10-CM | POA: Diagnosis not present

## 2014-06-21 DIAGNOSIS — D631 Anemia in chronic kidney disease: Secondary | ICD-10-CM | POA: Diagnosis not present

## 2014-06-21 DIAGNOSIS — N2581 Secondary hyperparathyroidism of renal origin: Secondary | ICD-10-CM | POA: Diagnosis not present

## 2014-06-21 DIAGNOSIS — I10 Essential (primary) hypertension: Secondary | ICD-10-CM | POA: Diagnosis not present

## 2014-06-21 DIAGNOSIS — R809 Proteinuria, unspecified: Secondary | ICD-10-CM | POA: Diagnosis not present

## 2014-06-21 DIAGNOSIS — N039 Chronic nephritic syndrome with unspecified morphologic changes: Secondary | ICD-10-CM | POA: Diagnosis not present

## 2014-06-21 LAB — CBC AND DIFFERENTIAL
HCT: 33 % — AB (ref 36–46)
Hemoglobin: 11.2 g/dL — AB (ref 12.0–16.0)
Neutrophils Absolute: 3 /uL
Platelets: 311 10*3/uL (ref 150–399)
WBC: 5.5 10^3/mL

## 2014-06-21 LAB — BASIC METABOLIC PANEL
BUN: 13 mg/dL (ref 4–21)
Creatinine: 1.1 mg/dL (ref <=1.1)
Glucose: 84 mg/dL
Potassium: 4.5 mmol/L (ref 3.4–5.3)
Sodium: 140 mmol/L (ref 137–147)

## 2014-06-24 ENCOUNTER — Encounter: Payer: Self-pay | Admitting: Adult Health

## 2014-06-24 ENCOUNTER — Ambulatory Visit (INDEPENDENT_AMBULATORY_CARE_PROVIDER_SITE_OTHER): Payer: Medicare Other | Admitting: Adult Health

## 2014-06-24 VITALS — BP 172/78 | HR 67 | Temp 98.0°F | Resp 14 | Ht 61.0 in | Wt 145.5 lb

## 2014-06-24 DIAGNOSIS — F1190 Opioid use, unspecified, uncomplicated: Secondary | ICD-10-CM | POA: Insufficient documentation

## 2014-06-24 DIAGNOSIS — Z23 Encounter for immunization: Secondary | ICD-10-CM | POA: Diagnosis not present

## 2014-06-24 DIAGNOSIS — I10 Essential (primary) hypertension: Secondary | ICD-10-CM

## 2014-06-24 DIAGNOSIS — E785 Hyperlipidemia, unspecified: Secondary | ICD-10-CM

## 2014-06-24 DIAGNOSIS — Z79899 Other long term (current) drug therapy: Secondary | ICD-10-CM | POA: Diagnosis not present

## 2014-06-24 HISTORY — DX: Opioid use, unspecified, uncomplicated: F11.90

## 2014-06-24 LAB — LIPID PANEL
Cholesterol: 207 mg/dL — ABNORMAL HIGH (ref 0–200)
HDL: 65.7 mg/dL (ref >39.00)
LDL Cholesterol: 124 mg/dL — ABNORMAL HIGH (ref 0–99)
NonHDL: 141.3
Total CHOL/HDL Ratio: 3
Triglycerides: 87 mg/dL (ref 0.0–149.0)
VLDL: 17.4 mg/dL (ref 0.0–40.0)

## 2014-06-24 MED ORDER — TRAMADOL HCL 50 MG PO TABS: 50.0000 mg | ORAL_TABLET | Freq: Four times a day (QID) | ORAL | Status: DC | PRN

## 2014-06-24 MED ORDER — LABETALOL HCL 200 MG PO TABS: 100.0000 mg | ORAL_TABLET | Freq: Two times a day (BID) | ORAL | Status: DC

## 2014-06-24 MED ORDER — PANTOPRAZOLE SODIUM 20 MG PO TBEC: DELAYED_RELEASE_TABLET | ORAL | Status: DC

## 2014-06-24 MED ORDER — CHLORDIAZEPOXIDE HCL 25 MG PO CAPS: 25.0000 mg | ORAL_CAPSULE | Freq: Every day | ORAL | Status: DC | PRN

## 2014-06-24 MED ORDER — LISINOPRIL 10 MG PO TABS: 10.0000 mg | ORAL_TABLET | Freq: Every day | ORAL | Status: DC

## 2014-06-24 MED ORDER — HYDRALAZINE HCL 50 MG PO TABS: 50.0000 mg | ORAL_TABLET | Freq: Three times a day (TID) | ORAL | Status: DC | PRN

## 2014-06-24 MED ORDER — EZETIMIBE 10 MG PO TABS: 10.0000 mg | ORAL_TABLET | Freq: Every day | ORAL | Status: DC

## 2014-06-24 NOTE — Progress Notes (Signed)
Pre visit review using our clinic review tool, if applicable. No additional management support is needed unless otherwise documented below in the visit note. 

## 2014-06-24 NOTE — Patient Instructions (Signed)
  Please have your labs drawn prior to leaving the office.  We will notify you of results once available.  I have sent prescriptions to your pharmacy for refills on your medications and given you prescriptions for tramadol and librium.  Please schedule a 6 month follow up appointment with Dr. Derrel Nip. Make sure they give you 30 min.

## 2014-06-24 NOTE — Progress Notes (Signed)
Patient ID: Sabrina [name], female   DOB: 1940/12/24, 73 y.o.   MRN: [id number]   Subjective:    Patient ID: Sabrina [name], female    DOB: 07-11-1941, 73 y.o.   MRN: [id number]  HPI  Patient is a pleasant 73 year old female who presents to clinic for followup of hypertension, hyperlipidemia. She also needs refills on most of her medications which will also be addressed this visit. Patient has extensive history of coronary artery disease, hypertension, hyperlipidemia, renal artery stenosis status post stent, chronic kidney disease followed by Dr. Juleen China, chronic pain which has significantly improved since having hip surgery a few months ago.  Pertaining to her hypertension, she is taking her medications exactly as prescribed. She has hydralazine in the event that systolic blood pressure is greater than 150. She reports only having to take this medication a few times. She side effects from her medication.  Pertaining to her HLD, she reports recent visit to see her cardiologist with no changes made to her medications. She has been taking Zetia for several years. She reports no labs done. She has not had lipids checked recently.   Past Medical History  Diagnosis Date  . CAD (coronary artery disease)   . Hypertension   . Hyperlipidemia   . Renal artery stenosis   . Carotid artery stenosis   . Anxiety   . 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 Subclavian arterial stenosis   . Chronic kidney disease, stage III (moderate)   . Anemia of chronic kidney failure   . Secondary hyperparathyroidism      Past Surgical History  Procedure Laterality Date  . Cholecystectomy    . Tonsillectomy and adenoidectomy    . Abdominal hysterectomy  1976  . Total hip arthroplasty Left   . Carotid artery angioplasty Left   . Coronary angioplasty with stent placement  2000  . Toe amputation Right     small toe  . Cystoscopy with stent placement Bilateral   . Total hip arthroplasty  Right 2015     Family History  Problem Relation Age of Onset  . Stroke Mother   . Hypertension Mother   . Diabetes Mother   . Hypertension Father   . Heart disease Sister     MI     History   Social History  . Marital Status: Married    Spouse Name: N/A    Number of Children: N/A  . Years of Education: N/A   Occupational History  . Not on file.   Social History Main Topics  . Smoking status: Former Smoker    Quit date: 10/25/1998  . Smokeless tobacco: Never Used  . Alcohol Use: No  . Drug Use: No  . Sexual Activity: Not on file   Other Topics Concern  . Not on file   Social History Narrative  . No narrative on file    Current Outpatient Prescriptions on File Prior to Visit  Medication Sig Dispense Refill  . aspirin 81 MG tablet Take 81 mg by mouth daily.      . clopidogrel (PLAVIX) 75 MG tablet Take 1 tablet (75 mg total) by mouth once.  30 tablet  6  . fentaNYL (DURAGESIC - DOSED MCG/HR) 50 MCG/HR Place 1 patch (50 mcg total) onto the skin every 3 (three) days.  10 patch  0  . Multiple Vitamin (MULTIVITAMIN WITH MINERALS) TABS tablet Take 1 tablet by mouth daily.      . Omega-3 Fatty  Acids (FISH OIL) 1000 MG CAPS Take 1 capsule by mouth daily.       No current facility-administered medications on file prior to visit.     Review of Systems  Constitutional: Negative.   HENT: Negative.   Eyes: Negative.   Respiratory: Negative.  Negative for chest tightness and shortness of breath.   Cardiovascular: Negative.  Negative for leg swelling.  Gastrointestinal: Negative.   Genitourinary: Negative.   Musculoskeletal: Positive for arthralgias (pain bilateral hips significantly improved. ).  Skin: Negative.   Neurological: Negative.   Hematological: Negative.   Psychiatric/Behavioral: Negative.        Objective:  BP 172/78  Pulse 67  Temp(Src) 98 F (36.7 C) (Oral)  Resp 14  Ht 5\' 1"  (1.549 m)  Wt 145 lb 8 oz (65.998 kg)  BMI 27.51 kg/m2  SpO2  99%   Physical Exam  Constitutional: She is oriented to person, place, and time. She appears well-developed and well-nourished. No distress.  Ms. Alders has lost some weight since her surgery. Less strain on her joints.   HENT:  Head: Normocephalic and atraumatic.  Eyes: Conjunctivae and EOM are normal.  Neck: Normal range of motion. Neck supple.  Cardiovascular: Normal rate, regular rhythm and intact distal pulses.  Exam reveals no gallop and no friction rub.   Murmur heard. Pulmonary/Chest: Effort normal and breath sounds normal. No respiratory distress. She has no wheezes. She has no rales.  Musculoskeletal:  Ambulating very well. No assistive devices. Gait steady.  Neurological: She is alert and oriented to person, place, and time. She has normal reflexes. Coordination normal.  Skin: Skin is warm and dry.  Psychiatric: She has a normal mood and affect. Her behavior is normal. Judgment and thought content normal.       Assessment & Plan:   1. HLD (hyperlipidemia) On zetia. Check lipids. Follow. Return in 6 mo for f/u - Lipid panel  2. Essential hypertension Slightly elevated in clinic today but reports of controlled pressures at home. Hydralazine as needed for systolic > Q000111Q. Follow up 6 months.  - labetalol (NORMODYNE) 200 MG tablet; Take 0.5 tablets (100 mg total) by mouth 2 (two) times daily.  Dispense: 90 tablet; Refill: 4 - hydrALAZINE (APRESOLINE) 50 MG tablet; Take 1 tablet (50 mg total) by mouth 3 (three) times daily as needed.  Dispense: 90 tablet; Refill: 11  3. Medication management All medications were reviewed and refills given.   4. Need for prophylactic vaccination and inoculation against influenza Received flu vaccine today. - Flu Vaccine QUAD 36+ mos PF IM (Fluarix Quad PF)

## 2014-06-25 ENCOUNTER — Encounter: Payer: Self-pay | Admitting: Adult Health

## 2014-06-25 ENCOUNTER — Other Ambulatory Visit: Payer: Self-pay | Admitting: Adult Health

## 2014-06-25 MED ORDER — ATORVASTATIN CALCIUM 10 MG PO TABS: 10.0000 mg | ORAL_TABLET | Freq: Every day | ORAL | Status: DC

## 2014-06-28 ENCOUNTER — Other Ambulatory Visit: Payer: Self-pay | Admitting: Adult Health

## 2014-06-28 MED ORDER — LOVASTATIN 10 MG PO TABS: 10.0000 mg | ORAL_TABLET | Freq: Every day | ORAL | Status: DC

## 2014-07-04 ENCOUNTER — Encounter: Payer: Self-pay | Admitting: Adult Health

## 2014-07-11 ENCOUNTER — Telehealth: Payer: Self-pay | Admitting: Adult Health

## 2014-07-11 MED ORDER — FENTANYL 50 MCG/HR TD PT72: 50.0000 ug | MEDICATED_PATCH | TRANSDERMAL | Status: DC

## 2014-07-11 NOTE — Telephone Encounter (Signed)
rx ready for pick up, pt aware.

## 2014-07-11 NOTE — Telephone Encounter (Signed)
Pt called in needing a refill of fentaNYL (DURAGESIC-DOSED MCG/HR) 18mcg/hr.

## 2014-07-11 NOTE — Telephone Encounter (Signed)
Last refill 8.19.15, last OV 8.31.15.  Please advise refill.

## 2014-07-11 NOTE — Telephone Encounter (Signed)
I will refill but it will be post dated to fill on 07/24/14. Please print and I will sign. Thanks

## 2014-07-15 ENCOUNTER — Ambulatory Visit: Payer: Medicare Other | Admitting: Adult Health

## 2014-07-15 ENCOUNTER — Telehealth: Payer: Self-pay | Admitting: *Deleted

## 2014-07-15 MED ORDER — FENTANYL 50 MCG/HR TD PT72: 50.0000 ug | MEDICATED_PATCH | TRANSDERMAL | Status: DC

## 2014-07-15 NOTE — Telephone Encounter (Signed)
Pt aware Rx ready for pick up at Pharmacy.

## 2014-07-15 NOTE — Telephone Encounter (Signed)
Yes  Ok to refill,  printed rx

## 2014-07-15 NOTE — Telephone Encounter (Signed)
[name] called, states last Fentanyl refill was written to be filled on 9.30.15 but she does not have anymore and is to replace on Wed 9.23.15, however I spoke with the pharmacist and she last refilled Rx on 8.19.15.  May I change the Rx to reflect new refill date.  (Sabrina [name]) Please advise

## 2014-07-26 ENCOUNTER — Telehealth: Payer: Self-pay

## 2014-07-26 MED ORDER — ESOMEPRAZOLE MAGNESIUM 40 MG PO CPDR: 40.0000 mg | DELAYED_RELEASE_CAPSULE | Freq: Every day | ORAL | Status: DC

## 2014-07-26 NOTE — Telephone Encounter (Signed)
Pt currently on pantoprazole 20 mg bid

## 2014-07-26 NOTE — Telephone Encounter (Signed)
Done

## 2014-07-26 NOTE — Telephone Encounter (Signed)
The patient called hoping to get a prescription for nexium called in. She stated the original medicine she was prescribed isn't working as well as she believe nexium will.

## 2014-07-26 NOTE — Telephone Encounter (Signed)
Pt notified. Pt then stated she had questions about her cholesterol medication and librium, wanted to schedule an appointment with Dr. Derrel Nip. Appt scheduled 08/16/14

## 2014-07-30 ENCOUNTER — Ambulatory Visit: Payer: Medicare Other | Admitting: Adult Health

## 2014-07-30 ENCOUNTER — Telehealth: Payer: Self-pay | Admitting: *Deleted

## 2014-07-30 NOTE — Telephone Encounter (Signed)
Spoke with pt, she states she is allergic to Statins and has been prescribed Lovastatin to take in addition to Zetia.  Pt states she has excessive sweating when taking Lovastatin and has not taken it since approx. 9.27.15. pt further states she has a letter from [employer] requesting an exception for the Librium Rx which is no longer covered.  I spoke with the pharmacist he said that she only paid .75 for the Librium in September.  Im not sure if the letter is referring to Rx's going forward.  Pt has appoint with you on tomorrow morning to establish with you and to discuss.  Please advise

## 2014-07-30 NOTE — Telephone Encounter (Signed)
Keep appt  i will do nothing until then since i have never met her

## 2014-07-31 ENCOUNTER — Ambulatory Visit (INDEPENDENT_AMBULATORY_CARE_PROVIDER_SITE_OTHER): Payer: Medicare Other | Admitting: Internal Medicine

## 2014-07-31 ENCOUNTER — Encounter: Payer: Self-pay | Admitting: *Deleted

## 2014-07-31 ENCOUNTER — Encounter: Payer: Self-pay | Admitting: Internal Medicine

## 2014-07-31 VITALS — BP 160/68 | HR 72 | Temp 98.6°F | Resp 14 | Ht 61.0 in | Wt 154.5 lb

## 2014-07-31 DIAGNOSIS — I739 Peripheral vascular disease, unspecified: Secondary | ICD-10-CM

## 2014-07-31 DIAGNOSIS — N183 Chronic kidney disease, stage 3 unspecified: Secondary | ICD-10-CM

## 2014-07-31 DIAGNOSIS — I509 Heart failure, unspecified: Secondary | ICD-10-CM

## 2014-07-31 DIAGNOSIS — I10 Essential (primary) hypertension: Secondary | ICD-10-CM

## 2014-07-31 DIAGNOSIS — Z1239 Encounter for other screening for malignant neoplasm of breast: Secondary | ICD-10-CM | POA: Diagnosis not present

## 2014-07-31 DIAGNOSIS — E785 Hyperlipidemia, unspecified: Secondary | ICD-10-CM

## 2014-07-31 DIAGNOSIS — I2510 Atherosclerotic heart disease of native coronary artery without angina pectoris: Secondary | ICD-10-CM

## 2014-07-31 DIAGNOSIS — I130 Hypertensive heart and chronic kidney disease with heart failure and stage 1 through stage 4 chronic kidney disease, or unspecified chronic kidney disease: Secondary | ICD-10-CM

## 2014-07-31 MED ORDER — HYDROCHLOROTHIAZIDE 25 MG PO TABS: 25.0000 mg | ORAL_TABLET | Freq: Every day | ORAL | Status: DC

## 2014-07-31 NOTE — Patient Instructions (Addendum)
I AM ADDING HCTZ   25 mg daily for your blood pressure.  If you tolerate it,  We will combine it with the lisinopril into one pill.  Return for nonfasting kidney check (labs) before you run out of lisinopril   I agree with stopping the lovastatin.   We will repeat your cholesterol in 3 months   I will refer you to the [facility] eye doctor for your cataract surgery on the right eye

## 2014-07-31 NOTE — Progress Notes (Signed)
Pre-visit discussion using our clinic review tool. No additional management support is needed unless otherwise documented below in the visit note.  

## 2014-07-31 NOTE — Progress Notes (Signed)
Patient ID: Sabrina [name], female   DOB: [birth year], 73 y.o.   MRN: [id number]  Patient Active Problem List   Diagnosis Date Noted  . 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 Peripheral vascular disease 08/03/2014  . CAD (coronary artery disease) 08/03/2014  . HLD (hyperlipidemia) 06/24/2014  . Medication management 06/24/2014  . Need for prophylactic vaccination and inoculation against influenza 06/24/2014  . Need for vaccination with 13-polyvalent pneumococcal conjugate vaccine 12/28/2013  . Aortic systolic murmur on examination 12/28/2013  . Hip pain, bilateral 12/27/2013  . Low back pain 12/27/2013  . HTN (hypertension) 12/27/2013    Subjective:  CC:   Chief Complaint  Patient presents with  . Establish Care    Rey NP patient would also like to discuss Cholesterol medication.    HPI:   Sabrina [name] is a 73 y.o. female who presents for  Hypertension, hypertensive CKD,  PAD with hyperlipidemia,  and anxiety.  Hwith subclavian artery stenosis,  Occluded R carotid artery,  Patient left Carotid s/p CEA.   Has been taking  2 daily meds for management of hypertension,  Home numbers have been elevated lately despite taking labetalol bid , hydralazine prn systolic [id number], and lisinopril at noon.  She has a history of bilateral RAS s/p serial stenting in January and March 2015, and CKD followed by Dr Candiss Norse every 6 months.  Her last visit late August,  Note reviewed.   She is not tolerating lovastatin due to multiple symptoms she has attributed to the medication. Taking Victorino Sparrow  As well.   Has asymptomatic  CAD with history of CABG in 2000,  Followed by [facility]. Normal EF last ECHO 2013.   Wants to continue Librirm for management of anxiety.      Past Medical History  Diagnosis Date  . CAD (coronary artery disease)   . Hypertension   . Hyperlipidemia   . Renal artery stenosis   . Carotid artery stenosis    . Anxiety   . 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 Subclavian arterial stenosis   . Chronic kidney disease, stage III (moderate)     Followed by Dr. Juleen China  . Anemia of chronic kidney failure   . Secondary hyperparathyroidism     Past Surgical History  Procedure Laterality Date  . Cholecystectomy    . Tonsillectomy and adenoidectomy    . Abdominal hysterectomy  1976  . Total hip arthroplasty Left   . Carotid artery angioplasty Left   . Coronary angioplasty with stent placement  2000  . Toe amputation Right     small toe  . Cystoscopy with stent placement Bilateral   . Total hip arthroplasty Right 2015  . Coronary artery bypass graft  2000  . Carotid endarterectomy Left   . Renal artery angioplasty Bilateral mARCH 2015       The following portions of the patient's history were reviewed and updated as appropriate: Allergies, current medications, and problem list.    Review of Systems:   Patient denies headache, fevers, malaise, unintentional weight loss, skin rash, eye pain, sinus congestion and sinus pain, sore throat, dysphagia,  hemoptysis , cough, dyspnea, wheezing, chest pain, palpitations, orthopnea, edema, abdominal pain, nausea, melena, diarrhea, constipation, flank pain, dysuria, hematuria, urinary  Frequency, nocturia, numbness, tingling, seizures,  Focal weakness, Loss of consciousness,  Tremor, insomnia, depression, anxiety, and suicidal ideation.     History   Social History  .  Marital Status: Married    Spouse Name: N/A    Number of Children: N/A  . Years of Education: N/A   Occupational History  . Not on file.   Social History Main Topics  . Smoking status: Former Smoker    Quit date: 10/25/1998  . Smokeless tobacco: Never Used  . Alcohol Use: No  . Drug Use: No  . Sexual Activity: Not Currently   Other Topics Concern  . Not on file   Social History Narrative  . No narrative on file    Objective:  Filed Vitals:    07/31/14 0805  BP: 160/68  Pulse: 72  Temp: 98.6 F (37 C)  Resp: 14     General appearance: alert, cooperative and appears stated age Ears: normal TM's and external ear canals both ears Throat: lips, mucosa, and tongue normal; teeth and gums normal Neck: no adenopathy, no carotid bruit, supple, symmetrical, trachea midline and thyroid not enlarged, symmetric, no tenderness/mass/nodules Back: symmetric, no curvature. ROM normal. No CVA tenderness. Lungs: clear to auscultation bilaterally Heart: regular rate and rhythm, S1, S2 normal, no murmur, click, rub or gallop Abdomen: soft, non-tender; bowel sounds normal; no masses,  no organomegaly Pulses: 2+ and symmetric Skin: Skin color, texture, turgor normal. No rashes or lesions Lymph nodes: Cervical, supraclavicular, and axillary nodes normal.  Assessment and Plan:  HTN (hypertension) Not well controlled currently on labetalol bid and lisinopril. Last cr was 1.1  Trial of HCTZ 25 mg daily   Lab Results  Component Value Date   CREATININE 1.1 06/21/2014     HLD (hyperlipidemia) Not tolerating addition of lovastatin to zetia due to hot flashes and weakness.  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  MM ruled out .  Continue lisinopril, avoid nephrotoxins.   Peripheral vascular disease Asymptomatic currently.  Continue aspirin Zetia and 6 mnth follow up with [facility]  CAD (coronary artery disease) Asymptomatic, intolerant of statins.  Continue Xetia and aspirin   Updated Medication List Outpatient Encounter Prescriptions as of 07/31/2014   Medication Sig  . aspirin 81 MG tablet Take 81 mg by mouth daily.  . chlordiazePOXIDE (LIBRIUM) 25 MG capsule Take 1 capsule (25 mg total) by mouth daily as needed for anxiety.  . clopidogrel (PLAVIX) 75 MG tablet Take 1 tablet (75 mg total) by mouth once.  Marland Kitchen esomeprazole (NEXIUM) 40 MG capsule Take 1 capsule (40 mg total) by mouth daily.  Marland Kitchen ezetimibe (ZETIA) 10 MG tablet Take 1 tablet (10 mg total) by mouth daily.  . fentaNYL (DURAGESIC - DOSED MCG/HR) 50 MCG/HR Place 1 patch (50 mcg total) onto the skin every 3 (three) days.  . hydrALAZINE (APRESOLINE) 50 MG tablet Take 1 tablet (50 mg total) by mouth 3 (three) times daily as needed.  . labetalol (NORMODYNE) 200 MG tablet Take 0.5 tablets (100 mg total) by mouth 2 (two) times daily.  Marland Kitchen lisinopril (PRINIVIL,ZESTRIL) 10 MG tablet Take 1 tablet (10 mg total) by mouth daily.  . Multiple Vitamin (MULTIVITAMIN WITH MINERALS) TABS tablet Take 1 tablet by mouth daily.  . Omega-3 Fatty  Acids (FISH OIL) 1000 MG CAPS Take 1 capsule by mouth daily.  . traMADol (ULTRAM) 50 MG tablet Take 1 tablet (50 mg total) by mouth every 6 (six) hours as needed.  . hydrochlorothiazide (HYDRODIURIL) 25 MG tablet Take 1 tablet (25 mg total) by mouth daily.  . [DISCONTINUED] lovastatin (MEVACOR) 10 MG tablet Take 1 tablet (10 mg total) by mouth at bedtime.  . [DISCONTINUED] pantoprazole (PROTONIX) 20 MG tablet Take 1 tablet twice a day     Orders Placed This Encounter  Procedures  . MM DIGITAL SCREENING BILATERAL  . Basic metabolic panel    No Follow-up on file.

## 2014-08-03 ENCOUNTER — Encounter: Payer: Self-pay | Admitting: Internal Medicine

## 2014-08-03 DIAGNOSIS — I2510 Atherosclerotic heart disease of native coronary artery without angina pectoris: Secondary | ICD-10-CM | POA: Insufficient documentation

## 2014-08-03 DIAGNOSIS — I1310 Hypertensive heart and chronic kidney disease without heart failure, with stage 1 through stage 4 chronic kidney disease, or unspecified chronic kidney disease: Secondary | ICD-10-CM | POA: Insufficient documentation

## 2014-08-03 DIAGNOSIS — N183 Chronic kidney disease, stage 3 unspecified: Secondary | ICD-10-CM | POA: Insufficient documentation

## 2014-08-03 DIAGNOSIS — I739 Peripheral vascular disease, unspecified: Secondary | ICD-10-CM | POA: Insufficient documentation

## 2014-08-03 NOTE — Assessment & Plan Note (Signed)
Asymptomatic, intolerant of statins.  Continue Xetia and aspirin

## 2014-08-03 NOTE — Assessment & Plan Note (Signed)
Not tolerating addition of lovastatin to zetia due to hot flashes and weakness.  Continue zetia,  Stop lovastatin,  If symptoms continue will recommend resuming statin given history of CAD and PAD  Lab Results  Component Value Date   CHOL 207* 06/24/2014   HDL 65.70 06/24/2014   LDLCALC 124* 06/24/2014   TRIG 87.0 06/24/2014   CHOLHDL 3 06/24/2014

## 2014-08-03 NOTE — Assessment & Plan Note (Signed)
Patient has proteinuria on recent spot urine done august by nephrology.  MM ruled out .  Continue lisinopril, avoid nephrotoxins.

## 2014-08-03 NOTE — Assessment & Plan Note (Signed)
Asymptomatic currently.  Continue aspirin Zetia and 6 mnth follow up with [facility]

## 2014-08-03 NOTE — Assessment & Plan Note (Signed)
Not well controlled currently on labetalol bid and lisinopril. Last cr was 1.1  Trial of HCTZ 25 mg daily   Lab Results  Component Value Date   CREATININE 1.1 06/21/2014

## 2014-08-07 ENCOUNTER — Encounter: Payer: Self-pay | Admitting: Internal Medicine

## 2014-08-09 ENCOUNTER — Other Ambulatory Visit: Payer: Self-pay

## 2014-08-14 ENCOUNTER — Other Ambulatory Visit (INDEPENDENT_AMBULATORY_CARE_PROVIDER_SITE_OTHER): Payer: Medicare Other

## 2014-08-14 DIAGNOSIS — I130 Hypertensive heart and chronic kidney disease with heart failure and stage 1 through stage 4 chronic kidney disease, or unspecified chronic kidney disease: Secondary | ICD-10-CM

## 2014-08-14 DIAGNOSIS — N183 Chronic kidney disease, stage 3 unspecified: Secondary | ICD-10-CM

## 2014-08-14 DIAGNOSIS — I509 Heart failure, unspecified: Secondary | ICD-10-CM

## 2014-08-14 LAB — BASIC METABOLIC PANEL
BUN: 27 mg/dL — ABNORMAL HIGH (ref 6–23)
CO2: 31 mEq/L (ref 19–32)
Calcium: 9.7 mg/dL (ref 8.4–10.5)
Chloride: 104 mEq/L (ref 96–112)
Creatinine, Ser: 1.4 mg/dL — ABNORMAL HIGH (ref 0.4–1.2)
GFR: 49.09 mL/min — ABNORMAL LOW (ref >60.00)
Glucose, Bld: 105 mg/dL — ABNORMAL HIGH (ref 70–99)
Potassium: 4.5 mEq/L (ref 3.5–5.1)
Sodium: 140 mEq/L (ref 135–145)

## 2014-08-15 ENCOUNTER — Encounter: Payer: Self-pay | Admitting: Internal Medicine

## 2014-08-16 ENCOUNTER — Other Ambulatory Visit: Payer: Self-pay | Admitting: Internal Medicine

## 2014-08-16 ENCOUNTER — Ambulatory Visit: Payer: Medicare Other | Admitting: Internal Medicine

## 2014-08-16 DIAGNOSIS — N179 Acute kidney failure, unspecified: Secondary | ICD-10-CM

## 2014-08-19 ENCOUNTER — Telehealth: Payer: Self-pay

## 2014-08-19 NOTE — Telephone Encounter (Signed)
Patient scheduled for labs

## 2014-08-19 NOTE — Telephone Encounter (Signed)
The patient called hoping to get worked in this week for her medication problems.  I read to her the emails that were sent back and forth, however, she did not want to schedule a blood test as directed.  She states she is need of a an appointment with Dr.Tullo.  Please advise if you want her worked in.

## 2014-08-21 ENCOUNTER — Other Ambulatory Visit: Payer: Self-pay | Admitting: Internal Medicine

## 2014-08-21 ENCOUNTER — Encounter: Payer: Self-pay | Admitting: Internal Medicine

## 2014-08-21 ENCOUNTER — Telehealth: Payer: Self-pay | Admitting: *Deleted

## 2014-08-21 ENCOUNTER — Other Ambulatory Visit (INDEPENDENT_AMBULATORY_CARE_PROVIDER_SITE_OTHER): Payer: Medicare Other

## 2014-08-21 DIAGNOSIS — N179 Acute kidney failure, unspecified: Secondary | ICD-10-CM

## 2014-08-21 LAB — BASIC METABOLIC PANEL
BUN: 23 mg/dL (ref 6–23)
CO2: 21 mEq/L (ref 19–32)
Calcium: 9.5 mg/dL (ref 8.4–10.5)
Chloride: 101 mEq/L (ref 96–112)
Creatinine, Ser: 1.4 mg/dL — ABNORMAL HIGH (ref 0.4–1.2)
GFR: 47.86 mL/min — ABNORMAL LOW (ref >60.00)
Glucose, Bld: 85 mg/dL (ref 70–99)
Potassium: 4.5 mEq/L (ref 3.5–5.1)
Sodium: 138 mEq/L (ref 135–145)

## 2014-08-21 MED ORDER — AMLODIPINE BESYLATE 2.5 MG PO TABS: 2.5000 mg | ORAL_TABLET | Freq: Every day | ORAL | Status: DC

## 2014-08-21 MED ORDER — FENTANYL 50 MCG/HR TD PT72: 50.0000 ug | MEDICATED_PATCH | TRANSDERMAL | Status: DC

## 2014-08-21 NOTE — Addendum Note (Signed)
Addended by: Crecencio Mc on: 08/21/2014 04:08 PM   Modules accepted: Orders

## 2014-08-21 NOTE — Telephone Encounter (Signed)
This patient is new to me ,  Why is she taking fetanyl?  I am not comfortable prescribing this

## 2014-08-21 NOTE — Telephone Encounter (Signed)
Tiffany from pharmacy called states pt is requesting Fentanyl refill.  Last refill 9.22.15.  Please advise refill

## 2014-08-22 ENCOUNTER — Encounter: Payer: Self-pay | Admitting: Internal Medicine

## 2014-08-22 NOTE — Telephone Encounter (Signed)
Ok to give her the refill

## 2014-08-22 NOTE — Telephone Encounter (Signed)
I spoke with pt, she states Dr Brynda Greathouse prescribed Fentanyl 75 mcg approx 10 years ago for headaches.  She states Raquel Rey, NP decreased the dosage to 50 mcg.  She is using Fentanyl Patches for headaches.  Please advise

## 2014-08-23 NOTE — Telephone Encounter (Signed)
Spoke with pt, advised Rx ready for pick up

## 2014-08-23 NOTE — Progress Notes (Signed)
Patient lab scheduled along with follow up appointment.

## 2014-08-27 ENCOUNTER — Telehealth: Payer: Self-pay

## 2014-08-27 MED ORDER — AMLODIPINE BESYLATE 5 MG PO TABS: 5.0000 mg | ORAL_TABLET | Freq: Every day | ORAL | Status: DC

## 2014-08-27 NOTE — Telephone Encounter (Signed)
Patient called today and stated that she went to the pharmacy to pick up her Rx for Amlodipine and it was the wrong dose. Patient stated that when she was under the care of Raquel Rey, NP the dose on her amlodipine was 5mg  and Dr. Derrel Nip has changed the dose to 2.5mg . Patient believes that she should continue on the 5mg  of amlodipine because of her high blood pressure readings and would like dose changed back to the 5mg . Please advise. Patient expecting a callback.

## 2014-08-27 NOTE — Addendum Note (Signed)
Addended by: Crecencio Mc on: 08/27/2014 02:55 PM   Modules accepted: Orders

## 2014-08-27 NOTE — Telephone Encounter (Signed)
Dose increased to 5 mg and sent to [facility].

## 2014-08-27 NOTE — Telephone Encounter (Signed)
Patient notified that dose has been changed to 5mg . Patient verbalized understanding.

## 2014-09-04 ENCOUNTER — Other Ambulatory Visit (INDEPENDENT_AMBULATORY_CARE_PROVIDER_SITE_OTHER): Payer: Medicare Other

## 2014-09-04 DIAGNOSIS — Z79899 Other long term (current) drug therapy: Secondary | ICD-10-CM | POA: Diagnosis not present

## 2014-09-04 DIAGNOSIS — N179 Acute kidney failure, unspecified: Secondary | ICD-10-CM

## 2014-09-04 DIAGNOSIS — Z79891 Long term (current) use of opiate analgesic: Secondary | ICD-10-CM | POA: Diagnosis not present

## 2014-09-04 LAB — BASIC METABOLIC PANEL
BUN: 15 mg/dL (ref 6–23)
CO2: 25 mEq/L (ref 19–32)
Calcium: 9.5 mg/dL (ref 8.4–10.5)
Chloride: 105 mEq/L (ref 96–112)
Creatinine, Ser: 1.2 mg/dL (ref 0.4–1.2)
GFR: 55.11 mL/min — ABNORMAL LOW (ref >60.00)
Glucose, Bld: 90 mg/dL (ref 70–99)
Potassium: 4.6 mEq/L (ref 3.5–5.1)
Sodium: 141 mEq/L (ref 135–145)

## 2014-09-06 ENCOUNTER — Ambulatory Visit (INDEPENDENT_AMBULATORY_CARE_PROVIDER_SITE_OTHER): Payer: Medicare Other | Admitting: Internal Medicine

## 2014-09-06 ENCOUNTER — Encounter: Payer: Self-pay | Admitting: Internal Medicine

## 2014-09-06 ENCOUNTER — Encounter: Payer: Self-pay | Admitting: *Deleted

## 2014-09-06 ENCOUNTER — Ambulatory Visit: Payer: Medicare Other | Admitting: Internal Medicine

## 2014-09-06 VITALS — BP 156/80 | HR 65 | Temp 98.2°F | Resp 14 | Ht 61.0 in | Wt 157.0 lb

## 2014-09-06 DIAGNOSIS — H269 Unspecified cataract: Secondary | ICD-10-CM

## 2014-09-06 DIAGNOSIS — M545 Low back pain, unspecified: Secondary | ICD-10-CM | POA: Insufficient documentation

## 2014-09-06 DIAGNOSIS — I10 Essential (primary) hypertension: Secondary | ICD-10-CM

## 2014-09-06 DIAGNOSIS — F411 Generalized anxiety disorder: Secondary | ICD-10-CM

## 2014-09-06 DIAGNOSIS — F1190 Opioid use, unspecified, uncomplicated: Secondary | ICD-10-CM

## 2014-09-06 DIAGNOSIS — I2510 Atherosclerotic heart disease of native coronary artery without angina pectoris: Secondary | ICD-10-CM

## 2014-09-06 MED ORDER — LISINOPRIL 20 MG PO TABS: 20.0000 mg | ORAL_TABLET | Freq: Every day | ORAL | Status: DC

## 2014-09-06 NOTE — Progress Notes (Signed)
Patient ID: Sabrina [name], female   DOB: 1941/03/07, 73 y.o.   MRN: [id number]   Patient Active Problem List   Diagnosis Date Noted  . Generalized anxiety disorder 09/08/2014  . 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 Peripheral vascular disease 08/03/2014  . CAD (coronary artery disease) 08/03/2014  . HLD (hyperlipidemia) 06/24/2014  . Chronic narcotic use 06/24/2014  . Need for prophylactic vaccination and inoculation against influenza 06/24/2014  . Need for vaccination with 13-polyvalent pneumococcal conjugate vaccine 12/28/2013  . Aortic systolic murmur on examination 12/28/2013  . Hip pain, bilateral 12/27/2013  . Low back pain 12/27/2013  . HTN (hypertension) 12/27/2013    Subjective:  CC:   Chief Complaint  Patient presents with  . Follow-up    lab results patient is very anxious and shaky. BP elevated since sunday.    HPI:   Sabrina [name] is a 73 y.o. female who presents for   follow up on renal hypertension and CKD with recent elevation in Cr .   Patient was started on [place] for uncontrolled HTN on lisinopril ,labetalol, and prn hydralazine.  Her Cr increased to 1.4 from 1.1 .  HCZ was stopped and amlodipine 5 mg daily was started.    Since stopping the hct cr has improved    Has been taking amlodipine since oct 28th,  sing lisinopril and labetalol,  Prn hydralazine.   BPs at home have been better controlled.    Has a history of bilateral RAS with stenting done in January and March by [place] .  Has regular nephroogy follow up with Dr. Candiss Norse every 6 months .  2) new onset RUQ pain with every swallow.  Coughs when she drinks water,  No dysphagia.   3) Needs referral for cataracts, wants a referral to Dr Gloriann Loan   4) Patient needs PA to continue nexium. Prior trial of pantoprazole caused nausea and sweats  5) chronic narcotic use.  She has been prescribed fentanyl for years for headaches by Dr  Brynda Greathouse originally, and has been gradually reducing her dose.  She also has chronic Back pain which improves with sitting .  Does not radiate. No history of trauma or fall. pain is at the  [place]   Sore to the touch,     Past Medical History  Diagnosis Date  . CAD (coronary artery disease)   . Hypertension   . Hyperlipidemia   . Renal artery stenosis   . Carotid artery stenosis   . Anxiety   . 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 Subclavian arterial stenosis   . Chronic kidney disease, stage III (moderate)     Followed by Dr. Juleen China  . Anemia of chronic kidney failure   . Secondary hyperparathyroidism     Past Surgical History  Procedure Laterality Date  . Cholecystectomy    . Tonsillectomy and adenoidectomy    . Abdominal hysterectomy  1976  . Total hip arthroplasty Left   . Carotid artery angioplasty Left   . Coronary angioplasty with stent placement  2000  . Toe amputation Right     small toe  . Cystoscopy with stent placement Bilateral   . Total hip arthroplasty Right 2015  . Coronary artery bypass graft  2000  . Carotid endarterectomy Left   . Renal artery angioplasty Bilateral mARCH 2015       The following portions of the patient's history were reviewed and updated  as appropriate: Allergies, current medications, and problem list.    Review of Systems:   Patient denies headache, fevers, malaise, unintentional weight loss, skin rash, eye pain, sinus congestion and sinus pain, sore throat, dysphagia,  hemoptysis , cough, dyspnea, wheezing, chest pain, palpitations, orthopnea, edema, abdominal pain, nausea, melena, diarrhea, constipation, flank pain, dysuria, hematuria, urinary  Frequency, nocturia, numbness, tingling, seizures,  Focal weakness, Loss of consciousness,  Tremor, insomnia, depression, anxiety, and suicidal ideation.     History   Social History  . Marital Status: Married    Spouse Name: N/A    Number of Children: N/A  . Years of  Education: N/A   Occupational History  . Not on file.   Social History Main Topics  . Smoking status: Former Smoker    Quit date: 10/25/1998  . Smokeless tobacco: Never Used  . Alcohol Use: No  . Drug Use: No  . Sexual Activity: Not Currently   Other Topics Concern  . Not on file   Social History Narrative    Objective:  Filed Vitals:   09/06/14 1028  BP: 156/80  Pulse: 65  Temp: 98.2 F (36.8 C)  Resp: 14     General appearance: alert, cooperative and appears stated age Ears: normal TM's and external ear canals both ears Throat: lips, mucosa, and tongue normal; teeth and gums normal Neck: no adenopathy, no carotid bruit, supple, symmetrical, trachea midline and thyroid not enlarged, symmetric, no tenderness/mass/nodules Back: symmetric, no curvature. ROM normal. No CVA tenderness. Lungs: clear to auscultation bilaterally Heart: regular rate and rhythm, S1, S2 normal, no murmur, click, rub or gallop Abdomen: soft, non-tender; bowel sounds normal; no masses,  no organomegaly Pulses: 2+ and symmetric Skin: Skin color, texture, turgor normal. No rashes or lesions Lymph nodes: Cervical, supraclavicular, and axillary nodes normal.  Assessment and Plan:  HTN (hypertension) Not well controlled since stopping hctz due to change in Cr.  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    Chronic narcotic use Continue to reduce use of fentanyl , prescribed years ago for headaches.   Generalized anxiety disorder Secondary to health issues,  Those of her son.  Will recommend trial of SSRI in the near future.   A total of 30 minutes was spent with patient more than half of which was spent in counseling patient on the above mentioned issues , reviewing and explaining recent labs and imaging studies done, and coordination of  care.  Updated Medication List Outpatient Encounter Prescriptions as of 09/06/2014  Medication Sig  . amLODipine (NORVASC) 5 MG tablet Take 1 tablet (5 mg total) by mouth daily.  Marland Kitchen aspirin 81 MG tablet Take 81 mg by mouth daily.  . chlordiazePOXIDE (LIBRIUM) 25 MG capsule Take 1 capsule (25 mg total) by mouth daily as needed for anxiety.  . clopidogrel (PLAVIX) 75 MG tablet Take 1 tablet (75 mg total) by mouth once.  Marland Kitchen esomeprazole (NEXIUM) 40 MG capsule Take 1 capsule (40 mg total) by mouth daily.  Marland Kitchen ezetimibe (ZETIA) 10 MG tablet Take 1 tablet (10 mg total) by mouth daily.  . fentaNYL (DURAGESIC - DOSED MCG/HR) 50 MCG/HR Place 1 patch (50 mcg total) onto the skin every 3 (three) days.  . hydrALAZINE (APRESOLINE) 50 MG tablet Take 1 tablet (50 mg total) by mouth 3 (three) times daily  as needed.  . labetalol (NORMODYNE) 200 MG tablet Take 0.5 tablets (100 mg total) by mouth 2 (two) times daily.  . Multiple Vitamin (MULTIVITAMIN WITH MINERALS) TABS tablet Take 1 tablet by mouth daily.  . Omega-3 Fatty Acids (FISH OIL) 1000 MG CAPS Take 1 capsule by mouth daily.  . traMADol (ULTRAM) 50 MG tablet Take 1 tablet (50 mg total) by mouth every 6 (six) hours as needed.  Marland Kitchen lisinopril (PRINIVIL,ZESTRIL) 20 MG tablet Take 1 tablet (20 mg total) by mouth daily.  . [DISCONTINUED] hydrochlorothiazide (HYDRODIURIL) 25 MG tablet Take 1 tablet (25 mg total) by mouth daily.     Orders Placed This Encounter  Procedures  . DG Lumbar Spine Complete  . Ambulatory referral to Optometry    No Follow-up on file.

## 2014-09-06 NOTE — Progress Notes (Signed)
Pre-visit discussion using our clinic review tool. No additional management support is needed unless otherwise documented below in the visit note.  

## 2014-09-06 NOTE — Assessment & Plan Note (Addendum)
Not well controlled since stopping hctz due to change in Cr.  Increase lisinopril to 20 mg daily ,  Continue amlodipine to 10 mg daily

## 2014-09-06 NOTE — Patient Instructions (Addendum)
Increase the lisinopril to 20 mg daily at 12 noon.  If bps improve are are mostly 130/80 or less,  We. re fine    I have ordered x rays  of your lumbar spine to do at [facility]

## 2014-09-08 ENCOUNTER — Encounter: Payer: Self-pay | Admitting: Internal Medicine

## 2014-09-08 DIAGNOSIS — F411 Generalized anxiety disorder: Secondary | ICD-10-CM | POA: Insufficient documentation

## 2014-09-08 NOTE — Assessment & Plan Note (Addendum)
Continue to reduce use of fentanyl , prescribed years ago for headaches.

## 2014-09-08 NOTE — Assessment & Plan Note (Signed)
May be from bilateral DJD of hips,  But no prior lumbosacral films have been done to evaluate.

## 2014-09-08 NOTE — Assessment & Plan Note (Signed)
Secondary to health issues,  Those of her son.  Will recommend trial of SSRI in the near future.

## 2014-09-09 ENCOUNTER — Telehealth: Payer: Self-pay | Admitting: *Deleted

## 2014-09-09 NOTE — Telephone Encounter (Signed)
Left message, notifying patient. 

## 2014-09-09 NOTE — Telephone Encounter (Signed)
Lisinopril 20 mg daily  Confirmed,  If she has been taking 5 mg amlodipine ,  continue 5 mg amlodipine

## 2014-09-09 NOTE — Telephone Encounter (Signed)
Pt presented to office, confused with her BP medication. Questioned if she is to be taking Amlodipine 20 mg daily.  Med list states Amlodipine 5 mg, office visit note 09/06/14  says Amlodipine 10 mg daily, but that not mentioned in [facility] or changed on med list.  Confirmed pt to be taking Lisinopril 20 mg daily, she will pick up that Rx today.

## 2014-09-10 ENCOUNTER — Telehealth: Payer: Self-pay | Admitting: Internal Medicine

## 2014-09-10 NOTE — Telephone Encounter (Signed)
FYI started PA with [facility] for Nexium for patient.

## 2014-09-12 ENCOUNTER — Telehealth: Payer: Self-pay | Admitting: Internal Medicine

## 2014-09-12 NOTE — Telephone Encounter (Signed)
Pharmacy called for verification on patient medication stated patient is very confused thinks she is to take 20 mg of amlodipine for BP advised Pharmacy no that patient is on 20 mg of lisinopril and 5 mg of amlodipine. Pharmacist stated she will explain to patient. FYI

## 2014-09-13 DIAGNOSIS — N2581 Secondary hyperparathyroidism of renal origin: Secondary | ICD-10-CM | POA: Diagnosis not present

## 2014-09-13 DIAGNOSIS — I701 Atherosclerosis of renal artery: Secondary | ICD-10-CM | POA: Diagnosis not present

## 2014-09-13 DIAGNOSIS — N183 Chronic kidney disease, stage 3 (moderate): Secondary | ICD-10-CM | POA: Diagnosis not present

## 2014-09-13 DIAGNOSIS — R809 Proteinuria, unspecified: Secondary | ICD-10-CM | POA: Diagnosis not present

## 2014-09-13 DIAGNOSIS — I129 Hypertensive chronic kidney disease with stage 1 through stage 4 chronic kidney disease, or unspecified chronic kidney disease: Secondary | ICD-10-CM | POA: Diagnosis not present

## 2014-09-13 LAB — BASIC METABOLIC PANEL
BUN: 18 mg/dL (ref 4–21)
Creatinine: 1.2 mg/dL — AB (ref 0.5–1.1)
Glucose: 89 mg/dL
Potassium: 4.4 mmol/L (ref 3.4–5.3)
Sodium: 139 mmol/L (ref 137–147)

## 2014-09-17 DIAGNOSIS — H2513 Age-related nuclear cataract, bilateral: Secondary | ICD-10-CM | POA: Diagnosis not present

## 2014-09-23 ENCOUNTER — Encounter: Payer: Self-pay | Admitting: Internal Medicine

## 2014-09-23 ENCOUNTER — Telehealth: Payer: Self-pay

## 2014-09-23 NOTE — Telephone Encounter (Signed)
She has ongoing surveillance or carodti artery stenosis with [facility]. If she has not seen them in a year,  She will need to call them to make appt

## 2014-09-23 NOTE — Telephone Encounter (Signed)
Pt notified, has not been seen there in "awhile" so will call [facility] to schedule.

## 2014-09-23 NOTE — Telephone Encounter (Signed)
Dr.Bell with [facility] called and is hoping to have the patient scheduled for a Carotid doppler.  He stated he noticed some build up on her exam.

## 2014-09-25 ENCOUNTER — Encounter: Payer: Self-pay | Admitting: *Deleted

## 2014-09-25 ENCOUNTER — Telehealth: Payer: Self-pay | Admitting: *Deleted

## 2014-09-25 NOTE — Telephone Encounter (Signed)
Pt called states Dr Gloriann Loan, Opthomologist, has been trying to reach [facility] in reference to pts Cholesterol and he is also requesting pt be scheduled for a Doppler on her Carotid Artery.  Please advise

## 2014-09-25 NOTE — Telephone Encounter (Signed)
Spoke with pt, advised again she needs to call [facility] for appointment.  Pt verbalized understanding.

## 2014-09-25 NOTE — Telephone Encounter (Signed)
I have already responded to this message days ago,  Patient is followed by [facility] for carotid stenosis and needs follow up with them.

## 2014-09-26 ENCOUNTER — Encounter: Payer: Self-pay | Admitting: Internal Medicine

## 2014-09-26 DIAGNOSIS — I6523 Occlusion and stenosis of bilateral carotid arteries: Secondary | ICD-10-CM

## 2014-09-26 NOTE — Telephone Encounter (Signed)
Referral is in process as requested 

## 2014-09-27 ENCOUNTER — Encounter: Payer: Self-pay | Admitting: Internal Medicine

## 2014-10-07 ENCOUNTER — Encounter: Payer: Self-pay | Admitting: Internal Medicine

## 2014-10-07 ENCOUNTER — Other Ambulatory Visit: Payer: Self-pay | Admitting: Internal Medicine

## 2014-10-07 MED ORDER — FENTANYL 50 MCG/HR TD PT72: 50.0000 ug | MEDICATED_PATCH | TRANSDERMAL | Status: DC

## 2014-10-07 NOTE — Telephone Encounter (Signed)
Fentanyl patch refilled.  Please be sure it printed

## 2014-10-07 NOTE — Telephone Encounter (Signed)
Please advise to refill 

## 2014-10-08 NOTE — Telephone Encounter (Signed)
Spoke with pt advised Rx ready for pick up at [facility]

## 2014-10-11 NOTE — Telephone Encounter (Signed)
Pt picked up Rx 10/10/14

## 2014-10-31 DIAGNOSIS — I6529 Occlusion and stenosis of unspecified carotid artery: Secondary | ICD-10-CM | POA: Diagnosis not present

## 2014-10-31 DIAGNOSIS — I70213 Atherosclerosis of native arteries of extremities with intermittent claudication, bilateral legs: Secondary | ICD-10-CM | POA: Diagnosis not present

## 2014-10-31 DIAGNOSIS — I129 Hypertensive chronic kidney disease with stage 1 through stage 4 chronic kidney disease, or unspecified chronic kidney disease: Secondary | ICD-10-CM | POA: Diagnosis not present

## 2014-10-31 DIAGNOSIS — G459 Transient cerebral ischemic attack, unspecified: Secondary | ICD-10-CM | POA: Diagnosis not present

## 2014-10-31 DIAGNOSIS — E785 Hyperlipidemia, unspecified: Secondary | ICD-10-CM | POA: Diagnosis not present

## 2014-10-31 DIAGNOSIS — I701 Atherosclerosis of renal artery: Secondary | ICD-10-CM | POA: Diagnosis not present

## 2014-11-06 ENCOUNTER — Other Ambulatory Visit: Payer: Self-pay | Admitting: Internal Medicine

## 2014-11-12 ENCOUNTER — Telehealth: Payer: Self-pay | Admitting: Internal Medicine

## 2014-11-13 MED ORDER — FENTANYL 50 MCG/HR TD PT72: 50.0000 ug | MEDICATED_PATCH | TRANSDERMAL | Status: DC

## 2014-11-13 NOTE — Telephone Encounter (Signed)
Ok to refill,  printed rx  

## 2014-11-14 ENCOUNTER — Ambulatory Visit (INDEPENDENT_AMBULATORY_CARE_PROVIDER_SITE_OTHER): Payer: Medicare Other | Admitting: Internal Medicine

## 2014-11-14 ENCOUNTER — Telehealth: Payer: Self-pay | Admitting: Internal Medicine

## 2014-11-14 ENCOUNTER — Encounter: Payer: Self-pay | Admitting: Internal Medicine

## 2014-11-14 VITALS — BP 161/76 | HR 66 | Temp 98.3°F | Ht 61.0 in

## 2014-11-14 DIAGNOSIS — M25551 Pain in right hip: Secondary | ICD-10-CM

## 2014-11-14 DIAGNOSIS — M549 Dorsalgia, unspecified: Secondary | ICD-10-CM | POA: Diagnosis not present

## 2014-11-14 DIAGNOSIS — G8929 Other chronic pain: Secondary | ICD-10-CM

## 2014-11-14 DIAGNOSIS — I10 Essential (primary) hypertension: Secondary | ICD-10-CM

## 2014-11-14 DIAGNOSIS — R1013 Epigastric pain: Secondary | ICD-10-CM | POA: Diagnosis not present

## 2014-11-14 LAB — COMPREHENSIVE METABOLIC PANEL
ALT: 12 U/L (ref 0–35)
AST: 18 U/L (ref 0–37)
Albumin: 4 g/dL (ref 3.5–5.2)
Alkaline Phosphatase: 90 U/L (ref 39–117)
BUN: 16 mg/dL (ref 6–23)
CO2: 30 mEq/L (ref 19–32)
Calcium: 9.5 mg/dL (ref 8.4–10.5)
Chloride: 102 mEq/L (ref 96–112)
Creatinine, Ser: 1.12 mg/dL (ref 0.40–1.20)
GFR: 61.37 mL/min (ref >60.00)
Glucose, Bld: 125 mg/dL — ABNORMAL HIGH (ref 70–99)
Potassium: 4.3 mEq/L (ref 3.5–5.1)
Sodium: 138 mEq/L (ref 135–145)
Total Bilirubin: 0.4 mg/dL (ref 0.2–1.2)
Total Protein: 6.9 g/dL (ref 6.0–8.3)

## 2014-11-14 MED ORDER — AMLODIPINE BESYLATE 10 MG PO TABS: 10.0000 mg | ORAL_TABLET | Freq: Every day | ORAL | Status: DC

## 2014-11-14 NOTE — Patient Instructions (Addendum)
Increase Amlodipine to 10mg  daily.  Continue Lisinopril 20mg  daily and Labetalol 100mg  twice daily.  Continue Hydralazine 50mg  up to three times daily if BP over Q000111Q systolic.  We will send testing today for H. Pylori infection in the stomach. Continue Nexium.  Follow up in 2 weeks.

## 2014-11-14 NOTE — Assessment & Plan Note (Signed)
BP Readings from Last 3 Encounters:  11/14/14 161/76  09/06/14 156/80  07/31/14 160/68   BP elevated today. She has been taking only 5mg  Amlodipine not 10mg  as directed, per Dr. Lupita Dawn last note. Will increase Amlodipine to 10mg  daily. Continue Lisinopril 20mg  daily and Labetalol 100mg  po bid. Will use Hydralazine prn for systolic BP [id number]. Follow up in 2 weeks. Renal function with labs today.

## 2014-11-14 NOTE — Progress Notes (Signed)
Subjective:    Patient ID: Sabrina [name], female    DOB: 06/10/1941, 74 y.o.   MRN: [id number]  HPI  74YO female presents for acute visit.  HTN - BP at home mostly 893-734K systolic, however did have one reading of 876 systolic. Taking Lisinopril, Labetalol, and Amlodipine. Uses Hydralazine if BP over 150.  Yesterday, had severe epigastric pain. No NVD. No fever, chills. Pain lasted for about 3 hours. Did not take anything for it. Pain has not recurred.Taking Nexium daily.  Also concerned about chronic aching back pain. Ongoing for years, worse recently. Trying to taper down and off Fentanyl. Dose was reduced to 36mg daily. Unable to take NSAIDS. No improvement with Lidoderm. Pain is worsened with walking and standing. Resolves with sitting.  Past medical, surgical, family and social history per today's encounter.  Review of Systems  Constitutional: Negative for fever, chills, appetite change, fatigue and unexpected weight change.  Eyes: Negative for visual disturbance.  Respiratory: Negative for shortness of breath.   Cardiovascular: Negative for chest pain and leg swelling.  Gastrointestinal: Positive for abdominal pain (epigastric). Negative for nausea, vomiting, diarrhea, constipation, abdominal distention and rectal pain.  Musculoskeletal: Positive for myalgias, back pain and arthralgias.  Skin: Negative for color change and rash.  Hematological: Negative for adenopathy. Does not bruise/bleed easily.  Psychiatric/Behavioral: Negative for dysphoric mood. The patient is not nervous/anxious.        Objective:    BP 161/76 mmHg  Pulse 66  Temp(Src) 98.3 F (36.8 C) (Oral)  Ht _0  (1.549 m)  SpO2 100% Physical Exam  Constitutional: She is oriented to person, place, and time. She appears well-developed and well-nourished. No distress.  HENT:  Head: Normocephalic and atraumatic.  Right Ear: External ear normal.  Left Ear: External ear normal.  Nose: Nose normal.    Mouth/Throat: Oropharynx is clear and moist. No oropharyngeal exudate.  Eyes: Conjunctivae and EOM are normal. Pupils are equal, round, and reactive to light. Right eye exhibits no discharge. Left eye exhibits no discharge. No scleral icterus.  Neck: Normal range of motion. Neck supple. No tracheal deviation present. No thyromegaly present.  Cardiovascular: Normal rate, regular rhythm, normal heart sounds and intact distal pulses.  Exam reveals no gallop and no friction rub.   No murmur heard. Pulmonary/Chest: Effort normal and breath sounds normal. No accessory muscle usage. No tachypnea. No respiratory distress. She has no decreased breath sounds. She has no wheezes. She has no rhonchi. She has no rales. She exhibits no tenderness.  Abdominal: Soft. Bowel sounds are normal. She exhibits no distension and no mass. There is no tenderness. There is no rebound and no guarding.  Musculoskeletal: Normal range of motion. She exhibits no edema or tenderness.       Lumbar back: She exhibits pain. She exhibits normal range of motion, no tenderness and no bony tenderness.  Lymphadenopathy:    She has no cervical adenopathy.  Neurological: She is alert and oriented to person, place, and time. No cranial nerve deficit. She exhibits normal muscle tone. Coordination normal.  Skin: Skin is warm and dry. No rash noted. She is not diaphoretic. No erythema. No pallor.  Psychiatric: She has a normal mood and affect. Her behavior is normal. Judgment and thought content normal.          Assessment & Plan:   Problem List Items Addressed This Visit      Unprioritized   Abdominal pain, epigastric    Recent epigastric pain, despite  use of Nexium. Testing for H. Pylori sent today. Discussed that she may need referral for EGD if symptoms are persistent.      Relevant Orders   H. 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 Will continue Fentanyl patch as prescribed  by Dr. Derrel Nip. Unable to take NSAIDS. No improvement in past with Lidoderm patch. Discussed referral to Dr. Sharlet Salina for further evaluation.      Relevant Orders   Ambulatory referral to Orthopedic Surgery   HTN (hypertension) - Primary    BP Readings from Last 3 Encounters:  11/14/14 161/76  09/06/14 156/80  07/31/14 160/68   BP elevated today. She has been taking only 40m Amlodipine not 124mas directed, per Dr. TuLupita Dawnast note. Will increase Amlodipine to 1032maily. Continue Lisinopril 33m67mily and Labetalol 100mg2mbid. Will use Hydralazine prn for systolic BP >150.>793low up in 2 weeks. Renal function with labs today.      Relevant Medications   amLODIpine (NORVASC) tablet   Other Relevant Orders   Comp Met (CMET)       Return in about 2 weeks (around 11/28/2014) for Recheck.

## 2014-11-14 NOTE — Telephone Encounter (Signed)
Script placed up front and patient notified.

## 2014-11-14 NOTE — Telephone Encounter (Signed)
Pt needs 2wk follow up (29mins). Please advise where to add pt.msn

## 2014-11-14 NOTE — Progress Notes (Signed)
Pre visit review using our clinic review tool, if applicable. No additional management support is needed unless otherwise documented below in the visit note. 

## 2014-11-14 NOTE — Assessment & Plan Note (Signed)
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 Will continue Fentanyl patch as prescribed by Dr. Derrel Nip. Unable to take NSAIDS. No improvement in past with Lidoderm patch. Discussed referral to Dr. Sharlet Salina for further evaluation.

## 2014-11-14 NOTE — Assessment & Plan Note (Signed)
Recent epigastric pain, despite use of Nexium. Testing for H. Pylori sent today. Discussed that she may need referral for EGD if symptoms are persistent.

## 2014-11-15 LAB — H. PYLORI BREATH TEST: H. pylori Breath Test: NOT DETECTED

## 2014-11-18 NOTE — Telephone Encounter (Signed)
Per Dr. Derrel Nip can schedule in 15 min spot.

## 2014-11-18 NOTE — Telephone Encounter (Signed)
Patient saw DR Gilford Rile was advised to follow up around 10 days no 30 min available can she be in 15 min? For chest pain.

## 2014-11-18 NOTE — Telephone Encounter (Signed)
yes

## 2014-12-04 ENCOUNTER — Encounter: Payer: Self-pay | Admitting: Internal Medicine

## 2014-12-04 ENCOUNTER — Ambulatory Visit (INDEPENDENT_AMBULATORY_CARE_PROVIDER_SITE_OTHER): Payer: Medicare Other | Admitting: Internal Medicine

## 2014-12-04 VITALS — BP 160/78 | HR 64 | Temp 98.4°F | Resp 14 | Ht 61.0 in | Wt 161.5 lb

## 2014-12-04 DIAGNOSIS — F411 Generalized anxiety disorder: Secondary | ICD-10-CM | POA: Diagnosis not present

## 2014-12-04 DIAGNOSIS — K2100 Gastro-esophageal reflux disease with esophagitis, without bleeding: Secondary | ICD-10-CM

## 2014-12-04 DIAGNOSIS — I10 Essential (primary) hypertension: Secondary | ICD-10-CM | POA: Diagnosis not present

## 2014-12-04 DIAGNOSIS — E785 Hyperlipidemia, unspecified: Secondary | ICD-10-CM

## 2014-12-04 NOTE — Progress Notes (Signed)
Pre-visit discussion using our clinic review tool. No additional management support is needed unless otherwise documented below in the visit note.  

## 2014-12-04 NOTE — Progress Notes (Signed)
Patient ID: Sabrina [name], female   DOB: 1940/12/21, 74 y.o.   MRN: [id number]   Patient Active Problem List   Diagnosis Date Noted  . Abdominal pain, epigastric 11/14/2014  . Chronic back pain 11/14/2014  . Generalized anxiety disorder 09/08/2014  . 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 Peripheral vascular disease 08/03/2014  . CAD (coronary artery disease) 08/03/2014  . HLD (hyperlipidemia) 06/24/2014  . Chronic narcotic use 06/24/2014  . Need for prophylactic vaccination and inoculation against influenza 06/24/2014  . Need for vaccination with 13-polyvalent pneumococcal conjugate vaccine 12/28/2013  . Aortic systolic murmur on examination 12/28/2013  . Hip pain, bilateral 12/27/2013  . Low back pain 12/27/2013  . HTN (hypertension) 12/27/2013    Subjective:  CC:   Chief Complaint  Patient presents with  . Follow-up    Medications  . Hypertension    HPI:   Sabrina [name] is a 74 y.o. female who presents for   Follow up on hypertension .  She was seen by Dr Gilford Rile two weeks ago for elevated readings and her medications were reviewed.  She had not increased her amldipine as directed to 10 mg daily.  Her BP has been well controlled since increasing the amlodipine to 10 mg daily  2) can't afford her medications .  [employer] is requesting Tier Exception PA for llibrium  (has tried prozac,  Uses librium for panic attacks )   Nexium 40 (prevacid, zantac,  Prilosed,  protonix and achipex all cause nausea) and Zetia;  (has tried statin which all causes itching)      Past Medical History  Diagnosis Date  . CAD (coronary artery disease)   . Hypertension   . Hyperlipidemia   . Renal artery stenosis   . Carotid artery stenosis   . Anxiety   . 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 Subclavian arterial stenosis   . Chronic kidney disease, stage III (moderate)     Followed by Dr. Juleen China   . Anemia of chronic kidney failure   . Secondary hyperparathyroidism     Past Surgical History  Procedure Laterality Date  . Cholecystectomy    . Tonsillectomy and adenoidectomy    . Abdominal hysterectomy  1976  . Total hip arthroplasty Left   . Carotid artery angioplasty Left   . Coronary angioplasty with stent placement  2000  . Toe amputation Right     small toe  . Cystoscopy with stent placement Bilateral   . Total hip arthroplasty Right 2015  . Coronary artery bypass graft  2000  . Carotid endarterectomy Left   . Renal artery angioplasty Bilateral mARCH 2015       The following portions of the patient's history were reviewed and updated as appropriate: Allergies, current medications, and problem list.    Review of Systems:   Patient denies headache, fevers, malaise, unintentional weight loss, skin rash, eye pain, sinus congestion and sinus pain, sore throat, dysphagia,  hemoptysis , cough, dyspnea, wheezing, chest pain, palpitations, orthopnea, edema, abdominal pain, nausea, melena, diarrhea, constipation, flank pain, dysuria, hematuria, urinary  Frequency, nocturia, numbness, tingling, seizures,  Focal weakness, Loss of consciousness,  Tremor, insomnia, depression, anxiety, and suicidal ideation.     History   Social History  . Marital Status: Divorced    Spouse Name: N/A  . Number of Children: N/A  . Years of Education: N/A   Occupational History  . Not on  file.   Social History Main Topics  . Smoking status: Former Smoker    Quit date: 10/25/1998  . Smokeless tobacco: Never Used  . Alcohol Use: No  . Drug Use: No  . Sexual Activity: Not Currently   Other Topics Concern  . Not on file   Social History Narrative    Objective:  Filed Vitals:   12/04/14 1526  BP: 160/78  Pulse: 64  Temp: 98.4 F (36.9 C)  Resp: 14     General appearance: alert, cooperative and appears stated age Ears: normal TM's and external ear canals both ears Throat:  lips, mucosa, and tongue normal; teeth and gums normal Neck: no adenopathy, no carotid bruit, supple, symmetrical, trachea midline and thyroid not enlarged, symmetric, no tenderness/mass/nodules Back: symmetric, no curvature. ROM normal. No CVA tenderness. Lungs: clear to auscultation bilaterally Heart: regular rate and rhythm, S1, S2 normal, no murmur, click, rub or gallop Abdomen: soft, non-tender; bowel sounds normal; no masses,  no organomegaly Pulses: 2+ and symmetric Skin: Skin color, texture, turgor normal. No rashes or lesions Lymph nodes: Cervical, supraclavicular, and axillary nodes normal.  Assessment and Plan:  Problem List Items Addressed This Visit    HTN (hypertension) - Primary    Improved with increased dose of amlodipine .  No changes today.       HLD (hyperlipidemia)    Managed with Zetia due to statin intolerance.       GERD (gastroesophageal reflux disease)    She has tried multiple trials of PPIs other than Nexium.  PA is in process       Generalized anxiety disorder    She has tried other SSRIs in the past with intolerances noted.  Continue Librax, may need to change to clonazepam for coverage issues.        A total of 25 minutes of face to face time was spent with patient more than half of which was spent in counselling about the above mentioned conditions  and coordination of care

## 2014-12-05 ENCOUNTER — Telehealth: Payer: Self-pay

## 2014-12-05 ENCOUNTER — Telehealth: Payer: Self-pay | Admitting: Internal Medicine

## 2014-12-05 NOTE — Telephone Encounter (Signed)
PA for nexium started on cover my meds.PA for nexium has been approved. PA for Librium started on paper given to Dr. Derrel Nip for signature before it can be faxed to plan.

## 2014-12-05 NOTE — Telephone Encounter (Signed)
Tier reduction started on Zetia and Chlordiazepoxide, fyi Nexium has been approved

## 2014-12-05 NOTE — Telephone Encounter (Signed)
Librium PA was denied.

## 2014-12-06 DIAGNOSIS — K219 Gastro-esophageal reflux disease without esophagitis: Secondary | ICD-10-CM | POA: Insufficient documentation

## 2014-12-06 NOTE — Assessment & Plan Note (Signed)
She has tried multiple trials of PPIs other than Nexium.  PA is in process

## 2014-12-06 NOTE — Assessment & Plan Note (Signed)
Managed with Zetia due to statin intolerance.

## 2014-12-06 NOTE — Assessment & Plan Note (Signed)
She has tried other SSRIs in the past with intolerances noted.  Continue Librax, may need to change to clonazepam for coverage issues.

## 2014-12-06 NOTE — Assessment & Plan Note (Signed)
Improved with increased dose of amlodipine .  No changes today.

## 2014-12-06 NOTE — Telephone Encounter (Signed)
nexium Rx approved through 10/25/15.

## 2014-12-09 NOTE — Telephone Encounter (Signed)
PA was denied for Zetia

## 2014-12-10 ENCOUNTER — Other Ambulatory Visit: Payer: Self-pay | Admitting: *Deleted

## 2014-12-10 MED ORDER — DIAZEPAM 10 MG PO TABS: 10.0000 mg | ORAL_TABLET | Freq: Every day | ORAL | Status: DC | PRN

## 2014-12-10 NOTE — Progress Notes (Unsigned)
Patient was denied for Chlordiazepoxide by insurance received back from MD that Diazepam discussed as an alternative. Script printed awaiting MD signature.

## 2014-12-12 ENCOUNTER — Ambulatory Visit: Payer: Self-pay | Admitting: Internal Medicine

## 2014-12-12 DIAGNOSIS — Z1231 Encounter for screening mammogram for malignant neoplasm of breast: Secondary | ICD-10-CM | POA: Diagnosis not present

## 2014-12-12 LAB — HM MAMMOGRAPHY: HM Mammogram: NEGATIVE

## 2014-12-13 ENCOUNTER — Telehealth: Payer: Self-pay | Admitting: *Deleted

## 2014-12-13 NOTE — Telephone Encounter (Signed)
Pt called states her insurance sent her a letter that the generic Nexium was approved.  Pt states the generic is not effective.  Pt further states the insurance is requesting a tier exception.  Advised pt insurance would need to send a tier exception form to be completed.

## 2014-12-19 ENCOUNTER — Telehealth: Payer: Self-pay | Admitting: Internal Medicine

## 2014-12-19 NOTE — Telephone Encounter (Signed)
Please call pt about medications. She wants to make sure her list is correct and the dose is right.msn

## 2014-12-20 DIAGNOSIS — H2513 Age-related nuclear cataract, bilateral: Secondary | ICD-10-CM | POA: Diagnosis not present

## 2014-12-23 NOTE — Telephone Encounter (Signed)
Patient is concerned her Nexium is generic.

## 2014-12-24 ENCOUNTER — Encounter: Payer: Self-pay | Admitting: Internal Medicine

## 2014-12-24 ENCOUNTER — Ambulatory Visit (INDEPENDENT_AMBULATORY_CARE_PROVIDER_SITE_OTHER): Payer: Medicare Other | Admitting: Internal Medicine

## 2014-12-24 VITALS — BP 112/70 | HR 68 | Temp 98.4°F | Resp 14 | Ht 61.0 in | Wt 162.0 lb

## 2014-12-24 DIAGNOSIS — M549 Dorsalgia, unspecified: Secondary | ICD-10-CM

## 2014-12-24 DIAGNOSIS — G8929 Other chronic pain: Secondary | ICD-10-CM | POA: Diagnosis not present

## 2014-12-24 DIAGNOSIS — F411 Generalized anxiety disorder: Secondary | ICD-10-CM

## 2014-12-24 DIAGNOSIS — E785 Hyperlipidemia, unspecified: Secondary | ICD-10-CM

## 2014-12-24 DIAGNOSIS — I10 Essential (primary) hypertension: Secondary | ICD-10-CM | POA: Diagnosis not present

## 2014-12-24 DIAGNOSIS — M5416 Radiculopathy, lumbar region: Secondary | ICD-10-CM | POA: Diagnosis not present

## 2014-12-24 DIAGNOSIS — M4806 Spinal stenosis, lumbar region: Secondary | ICD-10-CM | POA: Diagnosis not present

## 2014-12-24 DIAGNOSIS — M48062 Spinal stenosis, lumbar region with neurogenic claudication: Secondary | ICD-10-CM | POA: Insufficient documentation

## 2014-12-24 MED ORDER — FENTANYL 50 MCG/HR TD PT72: 50.0000 ug | MEDICATED_PATCH | TRANSDERMAL | Status: DC

## 2014-12-24 MED ORDER — CHLORDIAZEPOXIDE HCL 25 MG PO CAPS: 25.0000 mg | ORAL_CAPSULE | Freq: Every day | ORAL | Status: DC | PRN

## 2014-12-24 MED ORDER — TRAMADOL HCL 50 MG PO TABS: 50.0000 mg | ORAL_TABLET | Freq: Four times a day (QID) | ORAL | Status: DC | PRN

## 2014-12-24 NOTE — Patient Instructions (Signed)
You are paying the equivalent co pay for 2 of the OTC Nexium (brand name) daily  As you were when [facility] was charging you for the 40 mg Nexium,  So keep buying it over the counter  We will do an appeal on the Zetia ; it may take a few days  You can use the diazepam instead of the librium

## 2014-12-24 NOTE — Assessment & Plan Note (Signed)
She uses it once or twice per month,  Will change to diazepam

## 2014-12-24 NOTE — Progress Notes (Signed)
Patient ID: Sabrina [name], female   DOB: [birth year], 74 y.o.   MRN: [id number]   Patient Active Problem List   Diagnosis Date Noted  . GERD (gastroesophageal reflux disease) 12/06/2014  . Abdominal pain, epigastric 11/14/2014  . Chronic back pain 11/14/2014  . Generalized anxiety disorder 09/08/2014  . 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 Peripheral vascular disease 08/03/2014  . CAD (coronary artery disease) 08/03/2014  . HLD (hyperlipidemia) 06/24/2014  . Chronic narcotic use 06/24/2014  . Need for prophylactic vaccination and inoculation against influenza 06/24/2014  . Need for vaccination with 13-polyvalent pneumococcal conjugate vaccine 12/28/2013  . Aortic systolic murmur on examination 12/28/2013  . Hip pain, bilateral 12/27/2013  . Low back pain 12/27/2013  . HTN (hypertension) 12/27/2013    Subjective:  CC:   Chief Complaint  Patient presents with  . Follow-up    6 month    HPI:   Sabrina [name] is a 74 y.o. female who presents for   Medication problems  [employer] approved generic nexium which she  cannot take because it makes her feel nauseated and generally bad.  Her last trial of generic nexium was in august .  She has paid out of pocket for the OTC nexium 20 mg and it is costing her   A little under $40/month to take 40 mg daily  [employer] has also declined to cover Zetia, despite a request for prior authorization .  She has a history of statin intolerance.    Her blood pressures at home have been well controlled on current regimen    Past Medical History  Diagnosis Date  . CAD (coronary artery disease)   . Hypertension   . Hyperlipidemia   . Renal artery stenosis   . Carotid artery stenosis   . Anxiety   . 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 Subclavian arterial stenosis   . Chronic kidney disease, stage III (moderate)     Followed by Dr. Juleen China  . Anemia of  chronic kidney failure   . Secondary hyperparathyroidism     Past Surgical History  Procedure Laterality Date  . Cholecystectomy    . Tonsillectomy and adenoidectomy    . Abdominal hysterectomy  1976  . Total hip arthroplasty Left   . Carotid artery angioplasty Left   . Coronary angioplasty with stent placement  2000  . Toe amputation Right     small toe  . Cystoscopy with stent placement Bilateral   . Total hip arthroplasty Right 2015  . Coronary artery bypass graft  2000  . Carotid endarterectomy Left   . Renal artery angioplasty Bilateral mARCH 2015       The following portions of the patient's history were reviewed and updated as appropriate: Allergies, current medications, and problem list.    Review of Systems:   Patient denies headache, fevers, malaise, unintentional weight loss, skin rash, eye pain, sinus congestion and sinus pain, sore throat, dysphagia,  hemoptysis , cough, dyspnea, wheezing, chest pain, palpitations, orthopnea, edema, abdominal pain, nausea, melena, diarrhea, constipation, flank pain, dysuria, hematuria, urinary  Frequency, nocturia, numbness, tingling, seizures,  Focal weakness, Loss of consciousness,  Tremor, insomnia, depression, anxiety, and suicidal ideation.     History   Social History  . Marital Status: Divorced    Spouse Name: N/A  . Number of Children: N/A  . Years of Education: N/A   Occupational History  . Not on file.  Social History Main Topics  . Smoking status: Former Smoker    Quit date: 10/25/1998  . Smokeless tobacco: Never Used  . Alcohol Use: No  . Drug Use: No  . Sexual Activity: Not Currently   Other Topics Concern  . Not on file   Social History Narrative    Objective:  Filed Vitals:   12/24/14 1314  BP: 112/70  Pulse: 68  Temp: 98.4 F (36.9 C)  Resp: 14     General appearance: alert, cooperative and appears stated age Ears: normal TM's and external ear canals both ears Throat: lips, mucosa,  and tongue normal; teeth and gums normal Neck: no adenopathy, no carotid bruit, supple, symmetrical, trachea midline and thyroid not enlarged, symmetric, no tenderness/mass/nodules Back: symmetric, no curvature. ROM normal. No CVA tenderness. Lungs: clear to auscultation bilaterally Heart: regular rate and rhythm, S1, S2 normal, no murmur, click, rub or gallop Abdomen: soft, non-tender; bowel sounds normal; no masses,  no organomegaly Pulses: 2+ and symmetric Skin: Skin color, texture, turgor normal. No rashes or lesions Lymph nodes: Cervical, supraclavicular, and axillary nodes normal.  Assessment and Plan:  HLD (hyperlipidemia) She has a history of statin intolerance and has known CAD.  Will petition [employer] for Zetia    HTN (hypertension) Home bps have been under 140/80 consistently . No changes today Lab Results  Component Value Date   CREATININE 1.12 11/14/2014   Lab Results  Component Value Date   NA 138 11/14/2014   K 4.3 11/14/2014   CL 102 11/14/2014   CO2 30 11/14/2014      Generalized anxiety disorder She uses it once or twice per month,  Will change to diazepam    Chronic back pain Fentanyl patch refilled.    A total of 25 minutes of face to face time was spent with patient more than half of which was spent in counselling on the above mentioned issues.    Updated Medication List Outpatient Encounter Prescriptions as of 12/24/2014  Medication Sig  . amLODipine (NORVASC) 10 MG tablet Take 1 tablet (10 mg total) by mouth daily.  Marland Kitchen aspirin 81 MG tablet Take 81 mg by mouth daily.  . chlordiazePOXIDE (LIBRIUM) 25 MG capsule Take 1 capsule (25 mg total) by mouth daily as needed for anxiety.  . clopidogrel (PLAVIX) 75 MG tablet Take 1 tablet (75 mg total) by mouth once.  Marland Kitchen esomeprazole (NEXIUM) 40 MG capsule Take 1 capsule (40 mg total) by mouth daily.  Marland Kitchen ezetimibe (ZETIA) 10 MG tablet Take 1 tablet (10 mg total) by mouth daily.  . fentaNYL (DURAGESIC - DOSED  MCG/HR) 50 MCG/HR Place 1 patch (50 mcg total) onto the skin every 3 (three) days.  . hydrALAZINE (APRESOLINE) 50 MG tablet Take 1 tablet (50 mg total) by mouth 3 (three) times daily as needed.  . labetalol (NORMODYNE) 200 MG tablet Take 0.5 tablets (100 mg total) by mouth 2 (two) times daily.  Marland Kitchen lisinopril (PRINIVIL,ZESTRIL) 20 MG tablet TAKE ONE TABLET BY MOUTH ONCE DAILY  . Multiple Vitamin (MULTIVITAMIN WITH MINERALS) TABS tablet Take 1 tablet by mouth daily.  . Omega-3 Fatty Acids (FISH OIL) 1000 MG CAPS Take 1 capsule by mouth daily.  . traMADol (ULTRAM) 50 MG tablet Take 1 tablet (50 mg total) by mouth every 6 (six) hours as needed.  . [DISCONTINUED] chlordiazePOXIDE (LIBRIUM) 25 MG capsule Take 1 capsule (25 mg total) by mouth daily as needed for anxiety.  . [DISCONTINUED] fentaNYL (DURAGESIC - DOSED MCG/HR) 50 MCG/HR  Place 1 patch (50 mcg total) onto the skin every 3 (three) days.  . [DISCONTINUED] traMADol (ULTRAM) 50 MG tablet Take 1 tablet (50 mg total) by mouth every 6 (six) hours as needed.  . diazepam (VALIUM) 10 MG tablet Take 1 tablet (10 mg total) by mouth daily as needed for anxiety (May Use 0.5 tablet PRN). (Patient not taking: Reported on 12/24/2014)     No orders of the defined types were placed in this encounter.    Return in about 6 months (around 06/26/2015).

## 2014-12-24 NOTE — Progress Notes (Signed)
Pre-visit discussion using our clinic review tool. No additional management support is needed unless otherwise documented below in the visit note.  

## 2014-12-24 NOTE — Assessment & Plan Note (Addendum)
Home bps have been under 140/80 consistently . No changes today Lab Results  Component Value Date   CREATININE 1.12 11/14/2014   Lab Results  Component Value Date   NA 138 11/14/2014   K 4.3 11/14/2014   CL 102 11/14/2014   CO2 30 11/14/2014

## 2014-12-24 NOTE — Assessment & Plan Note (Addendum)
She has a history of statin intolerance and has known CAD.  Will petition [employer] for Zetia

## 2014-12-25 ENCOUNTER — Encounter: Payer: Self-pay | Admitting: Internal Medicine

## 2014-12-25 NOTE — Assessment & Plan Note (Signed)
Fentanyl patch refilled

## 2014-12-31 ENCOUNTER — Telehealth: Payer: Self-pay | Admitting: Internal Medicine

## 2014-12-31 NOTE — Telephone Encounter (Signed)
Started form for tier reduction on patient today.

## 2015-01-01 ENCOUNTER — Ambulatory Visit: Payer: Self-pay | Admitting: Physical Medicine and Rehabilitation

## 2015-01-01 DIAGNOSIS — M5127 Other intervertebral disc displacement, lumbosacral region: Secondary | ICD-10-CM | POA: Diagnosis not present

## 2015-01-01 DIAGNOSIS — M47816 Spondylosis without myelopathy or radiculopathy, lumbar region: Secondary | ICD-10-CM | POA: Diagnosis not present

## 2015-01-01 DIAGNOSIS — M5126 Other intervertebral disc displacement, lumbar region: Secondary | ICD-10-CM | POA: Diagnosis not present

## 2015-01-01 DIAGNOSIS — M545 Low back pain: Secondary | ICD-10-CM | POA: Diagnosis not present

## 2015-01-06 ENCOUNTER — Telehealth: Payer: Self-pay | Admitting: *Deleted

## 2015-01-06 NOTE — Telephone Encounter (Signed)
Sabrina [name] from [facility] left VM, stating Zetia Tier Exception approved, would fax over approval later today. FYI

## 2015-01-06 NOTE — Telephone Encounter (Signed)
Patient notified

## 2015-01-16 DIAGNOSIS — M5416 Radiculopathy, lumbar region: Secondary | ICD-10-CM | POA: Diagnosis not present

## 2015-01-16 DIAGNOSIS — M4806 Spinal stenosis, lumbar region: Secondary | ICD-10-CM | POA: Diagnosis not present

## 2015-01-24 DIAGNOSIS — H2511 Age-related nuclear cataract, right eye: Secondary | ICD-10-CM | POA: Diagnosis not present

## 2015-01-30 ENCOUNTER — Ambulatory Visit (INDEPENDENT_AMBULATORY_CARE_PROVIDER_SITE_OTHER): Payer: Medicare Other | Admitting: Internal Medicine

## 2015-01-30 ENCOUNTER — Other Ambulatory Visit: Payer: Self-pay | Admitting: Internal Medicine

## 2015-01-30 ENCOUNTER — Encounter: Payer: Self-pay | Admitting: Internal Medicine

## 2015-01-30 ENCOUNTER — Telehealth: Payer: Self-pay

## 2015-01-30 VITALS — BP 148/72 | HR 84 | Temp 98.8°F | Wt 155.0 lb

## 2015-01-30 DIAGNOSIS — J309 Allergic rhinitis, unspecified: Secondary | ICD-10-CM

## 2015-01-30 MED ORDER — MOMETASONE FUROATE 50 MCG/ACT NA SUSP: 2.0000 | Freq: Every day | NASAL | Status: DC

## 2015-01-30 NOTE — Telephone Encounter (Signed)
lmovm for pt to return my call 

## 2015-01-30 NOTE — Patient Instructions (Signed)

## 2015-01-30 NOTE — Progress Notes (Signed)
HPI  Pt presents to the clinic today with c/o nasal congestion, facial pain and pressure and left ear pain. She reports this started 3 days ago. She has had some left ear pain. She is blowing clear mucous out of her nose. She denies fever, chills or body aches. She has tried Coricidin without much relief. She has no history of allergies or breathing problems. She has not had sick contacts. She does not smoke.  Review of Systems    Past Medical History  Diagnosis Date  . CAD (coronary artery disease)   . Hypertension   . Hyperlipidemia   . Renal artery stenosis   . Carotid artery stenosis   . Anxiety   . 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 Subclavian arterial stenosis   . Chronic kidney disease, stage III (moderate)     Followed by Dr. Juleen China  . Anemia of chronic kidney failure   . Secondary hyperparathyroidism     Family History  Problem Relation Age of Onset  . Stroke Mother   . Hypertension Mother   . Diabetes Mother   . Hypertension Father   . Heart disease Sister     MI  . Multiple sclerosis Daughter   . Multiple sclerosis Son   . Cerebral aneurysm Son   . Seizures Son   . Cerebral aneurysm Son     History   Social History  . Marital Status: Divorced    Spouse Name: N/A  . Number of Children: N/A  . Years of Education: N/A   Occupational History  . Not on file.   Social History Main Topics  . Smoking status: Former Smoker    Quit date: 10/25/1998  . Smokeless tobacco: Never Used  . Alcohol Use: No  . Drug Use: No  . Sexual Activity: Not Currently   Other Topics Concern  . Not on file   Social History Narrative    Allergies  Allergen Reactions  . Dilaudid [Hydromorphone Hcl] Nausea And Vomiting  . Hydrochlorothiazide Other (See Comments)    Decreased GFR (Nov 2015)  . Nsaids     CKD stage III - avoid nephrotoxic drugs  . Nubain [Nalbuphine Hcl]     Burning sensation in back  . Penicillins Itching  . Statins Itching      Constitutional: Positive headache. Denies fatigue, fever or abrupt weight changes.  HEENT:  Positive facial pain, nasal congestion and ear pain. Denies eye redness, ringing in the ears, wax buildup, runny nose or sore throat. Respiratory: Denies cough, difficulty breathing or shortness of breath.  Cardiovascular: Denies chest pain, chest tightness, palpitations or swelling in the hands or feet.   No other specific complaints in a complete review of systems (except as listed in HPI above).  Objective:  BP 148/72 mmHg  Pulse 84  Temp(Src) 98.8 F (37.1 C) (Oral)  Wt 155 lb (70.308 kg)  SpO2 99%   General: Appears her stated age, well developed, well nourished in NAD. HEENT: Head: normal shape and size, mild maxillary sinus tenderness noted; Eyes: sclera white, no icterus, conjunctiva pink; Ears: Tm's gray and intact, normal light reflex; Nose: mucosa boggy and moist, septum midline; Throat/Mouth: + PND. Teeth present, mucosa pink and moist, no exudate noted, no lesions or ulcerations noted.  Neck: No adenopathy noted. Cardiovascular: Normal rate and rhythm. S1,S2 noted.  No murmur, rubs or gallops noted.  Pulmonary/Chest: Normal effort and positive vesicular breath sounds. No respiratory distress. No wheezes, rales or ronchi  noted.      Assessment & Plan:   Allergic Rhinitis  Can use a Neti Pot which can be purchased from your local drug store. Stop Coricidin and start Claritin OTC Delsym if needed for cough eRx for Nasonex, use as prescribed  RTC as needed or if symptoms persist.

## 2015-01-30 NOTE — Telephone Encounter (Signed)
She wanted brand name only. Will try to send generic Nasonex to see if it is cheaper, if not, she will have to get Flonase OTC

## 2015-01-30 NOTE — Telephone Encounter (Signed)
Pt was seen earlier today and Nasonex nasal spray cost to pt was $200.00 and pt request less expensive med to [facility] [address]. Pt request cb when called in to pharmacy. Pt will get OTC Claritin.

## 2015-01-30 NOTE — Progress Notes (Signed)
Pre visit review using our clinic review tool, if applicable. No additional management support is needed unless otherwise documented below in the visit note. 

## 2015-02-06 ENCOUNTER — Telehealth: Payer: Self-pay | Admitting: *Deleted

## 2015-02-06 MED ORDER — FENTANYL 50 MCG/HR TD PT72: 50.0000 ug | MEDICATED_PATCH | TRANSDERMAL | Status: DC

## 2015-02-06 NOTE — Telephone Encounter (Signed)
Pt came to the office requesting Fentanyl 50 mg patch refill.  Last refill 3.1.16 #10.  Please advise refill

## 2015-02-06 NOTE — Telephone Encounter (Signed)
Ok to refill,  printed rx  

## 2015-02-07 ENCOUNTER — Telehealth: Payer: Self-pay | Admitting: Internal Medicine

## 2015-02-07 ENCOUNTER — Other Ambulatory Visit: Payer: Self-pay | Admitting: Internal Medicine

## 2015-02-07 NOTE — Telephone Encounter (Signed)
Fentanyl was authorized for refill yesterday. Please check to confirm bc I have a duplicate request

## 2015-02-10 DIAGNOSIS — H2512 Age-related nuclear cataract, left eye: Secondary | ICD-10-CM | POA: Diagnosis not present

## 2015-02-10 DIAGNOSIS — H2511 Age-related nuclear cataract, right eye: Secondary | ICD-10-CM | POA: Diagnosis not present

## 2015-02-10 DIAGNOSIS — Z961 Presence of intraocular lens: Secondary | ICD-10-CM | POA: Diagnosis not present

## 2015-02-10 DIAGNOSIS — H25811 Combined forms of age-related cataract, right eye: Secondary | ICD-10-CM | POA: Diagnosis not present

## 2015-02-11 DIAGNOSIS — H2512 Age-related nuclear cataract, left eye: Secondary | ICD-10-CM | POA: Diagnosis not present

## 2015-02-15 NOTE — Op Note (Signed)
PATIENT NAME:  Sabrina [name] MR#:  [id number] DATE OF BIRTH:  Sep 25, 1941  DATE OF PROCEDURE:  11/06/2013  PREOPERATIVE DIAGNOSES:  1.  Malignant hypertension.  2.  Renal artery stenosis.  3.  Atherosclerotic occlusive disease, bilateral lower extremities.  4.  Pain of bilateral lower extremities.   POSTOPERATIVE DIAGNOSES:  1.  Malignant hypertension.  2.  Renal artery stenosis.  3.  Atherosclerotic occlusive disease, bilateral lower extremities.  4.  Pain of bilateral lower extremities.   PROCEDURES PERFORMED: 1.  Abdominal aortogram.  2.  Left renal artery angiography, selective injection.  3.  Percutaneous transluminal angioplasty and stent placement, left renal artery, with [name] 4 x 15 Herculink stent.   SURGEON: Hortencia Pilar, M.D.   SEDATION: Versed 5 mg plus fentanyl 200 mcg administered IV. Continuous ECG, pulse oximetry and cardiopulmonary monitoring is performed throughout the entire procedure by the interventional radiology nurse. Total sedation time is 40 minutes.   ACCESS: [name] 6-French sheath, right common femoral artery.   FLUOROSCOPY TIME: 5.0 minutes.   CONTRAST USED: Isovue 65 mL.   INDICATIONS: Sabrina [name] is [name] 74 year old woman with known renal artery stenosis who has developed profound hypertension over the past several months. At this point, she has been escalating her medications and is now on 5 oral antihypertensives daily. The risks and benefits for angiography and intervention were reviewed. All questions answered. The patient agrees to proceed.   DESCRIPTION OF PROCEDURE: The patient is taken to special procedures, placed in the supine position, and after adequate sedation has been achieved both groins are prepped and draped in [name] sterile fashion. Ultrasound is placed in [name] sterile sleeve and appropriate timeout is called.   The right groin is then evaluated with ultrasound. Common femoral artery is identified. It is echolucent and pulsatile indicating  patency. Moderate posterior plaque is noted. Image is recorded for the permanent record and under direct ultrasound visualization [name] micropuncture needle is inserted into the anterior wall followed by microwire and microsheath followed by the J-wire and [name] 5-French sheath. [name] J-wire and pigtail catheter are then advanced to the level of T12 and an AP projection of the abdominal aorta is obtained. Bilateral nephrograms are noted, normal and equal in size. Single renal arteries are noted with [name] greater than 80% stenosis of the left renal artery. There is bulky plaque noted with the right renal artery.   4000 units of heparin is given, J-wire is reintroduced, and the 5-French sheath is exchanged for [name] 6-French high flex Ansel sheath. The renal artery is then selected. Using [name] C2 catheter and [name] floppy Glidewire, C2 catheter is advanced and hand injection of contrast is used to verify left renal artery and evaluate distal anatomy. The high flex is then advanced over the C2 catheter to the renal ostia and then [name] grand slam wire is introduced through the C2 catheter. The catheter is removed and [name] 4 x 15 Herculink stent is advanced over the wire. It is positioned so that it extends 2 to 3 mm into the aorta and then inflation is performed to 14 atmospheres for approximately 30 seconds. As the balloon is being deflated, the sheath is advanced slightly into the origin of the stent. The balloon is then removed and hand injection of contrast is utilized to demonstrate the stent is well-positioned, fully expanded, and the renal artery is now widely patent.   The sheath is then pulled down into the distal aorta, wire is reintroduced, an oblique view of the  right groin is obtained, and StarClose device is deployed without complication.   INTERPRETATION: The abdominal aorta is opacified with [name] bolus injection of contrast. There is diffuse atherosclerotic plaque noted, but there are no hemodynamically significant lesions within  the aorta. Bilateral nephrograms are noted and they are equal in size. There are single renal arteries. Right renal artery is significantly larger than the left, measuring approximately 5 to 6 mm in diameter. There is bulky plaque within the origin of the right renal artery.   The left renal artery demonstrates [name] varying high-grade lesion of approximately 85 to 90%. The renal artery measures approximately 4 mm in diameter. Distally there are no abnormalities noted in the arterial system of the left kidney.   Following angioplasty and stent placement, there is widely patent left renal artery with full filling of the renal distally.   SUMMARY: Successful revascularization of the left renal artery. [name] 4 mm diameter stent was deployed.   The patient will undergo follow-up. Her blood pressure will be monitored. If she continues to have persistent hypertension refractory to medications then further treatment and evaluation of the right renal artery may be warranted. ____________________________ Katha Cabal, MD ggs:sb D: 11/06/2013 10:07:48 ET T: 11/06/2013 10:26:24 ET JOB#: [id number]  cc: Katha Cabal, MD, <Dictator> Dwayne D. Clayborn Bigness, MD Mikeal Hawthorne Brynda Greathouse, MD Katha Cabal MD ELECTRONICALLY SIGNED 11/08/2013 9:48

## 2015-02-15 NOTE — Op Note (Signed)
PATIENT NAME:  Sabrina [name], Sabrina [name] MR#:  [id number] DATE OF BIRTH:  1941-04-23  DATE OF PROCEDURE:  12/25/2013  PREOPERATIVE DIAGNOSES:  1.  Malignant hypertension.  2.  Atherosclerotic occlusive disease, bilateral renal arteries.  3.  Atherosclerotic occlusive disease, bilateral lower extremities.  4.  Coronary artery disease.   POSTOPERATIVE DIAGNOSES:  1.  Malignant hypertension.  2.  Atherosclerotic occlusive disease, bilateral renal arteries.  3.  Atherosclerotic occlusive disease, bilateral lower extremities.  4.  Coronary artery disease.   PROCEDURE PERFORMED:   1.  Selective injection of the right renal artery.  2.  Percutaneous transluminal angioplasty and stent placement, right renal artery.   SURGEON: Katha Cabal, MD   SEDATION: Precedex drip with supplemental IV fentanyl. Continuous ECG, pulse oximetry, and cardiopulmonary monitoring is performed throughout the entire procedure by the interventional radiology nurse. Total sedation time is 1 hour.   ACCESS: [name] 6-French sheath, left common femoral artery.   FLUOROSCOPY TIME: 6.7 minutes.   CONTRAST USED: Isovue 45 mL.   INDICATIONS: [name] is [name] 74 year old woman who has malignant hypertension, which has been incredibly difficult to control on what is at this present time 6 different medications. The risks and benefits for intervention of the right renal artery were reviewed. The patient has undergone successful left renal artery intervention approximately 1 month ago and is now returning for the right. Risks and benefits again were reviewed. All questions were answered. The patient has agreed to proceed.   DESCRIPTION OF PROCEDURE: The patient is taken to special procedures and placed in the supine position. After adequate sedation is achieved, both groins are prepped and draped in sterile fashion. Appropriate timeout is called.   Lidocaine 1% is infiltrated in the soft tissues overlying the left femoral impulse.  Ultrasound is placed in [name] sterile sleeve. Ultrasound is utilized secondary to lack of appropriate landmarks and to avoid vascular injury. Under direct visualization, common femoral artery is identified. It is echolucent and pulsatile, indicating patency. Image is recorded for the permanent record, and [name] micropuncture needle is used to access the anterior wall of the common femoral artery without difficulty. Microwire followed by micro sheath, J-wire followed by [name] 5-French sheath and 5-French pigtail catheter. Pigtail catheter is advanced to [name] level just above the visualized left renal artery stent, and AP projection of the aorta is obtained to localize the right renal artery. Pigtail catheter is then exchanged over [name] floppy Glidewire after the 5-French sheath is exchanged for [name] 6-French Ansell. [name] VS1 catheter is then advanced over the wire and using the combination of the VS1 catheter and the Glidewire, the right renal artery is engaged. Small hand injection is used to verify intraluminal placement of the catheter, and subsequently the wire is advanced and the balloon inflated to 5.5. The VS1 catheter is removed, and [name] 6 x 18 Herculink stent is advanced over the guidewire. Hand injection of contrast through the [facility] sheath is then utilized to position the stent, and the stent is deployed to 14 atmospheres for approximately 30 seconds. Followup angiography demonstrates wide patency with approximately 2 to 3 mm of stent protruding into the aorta. It is well matched distally to the renal artery as well. Nephrogram is unchanged. The sheath is then pulled back into the left external iliac. Oblique view is obtained and [name] StarClose device deployed. There are no immediate complications.   INTERPRETATION: As noted on previous exam, the aorta remains widely patent. There is patency of the left renal  artery stent as well. On the right, there is [name] high-grade, greater than 80% stenosis at the origin of the right renal  artery. Following angioplasty to 6 mm, there is less than 10% residual stenosis, wide patency, and the nephrogram is preserved.   SUMMARY: Successful angioplasty of the right renal artery as described above with stent placement.   ____________________________ Katha Cabal, MD ggs:jcm D: 12/25/2013 11:13:45 ET T: 12/25/2013 18:44:24 ET JOB#: [id number]  cc: Katha Cabal, MD, <Dictator> Mikeal Hawthorne. Brynda Greathouse, MD Dwayne D. Clayborn Bigness, MD Katha Cabal MD ELECTRONICALLY SIGNED 12/26/2013 11:03

## 2015-02-15 NOTE — Discharge Summary (Signed)
PATIENT NAME:  Sabrina [name], Sabrina [name] MR#:  [id number] DATE OF BIRTH:  Nov 16, 1940  DATE OF ADMISSION:  04/10/2014 DATE OF DISCHARGE:  04/13/2014   ADMITTING DIAGNOSIS: Right hip severe osteoarthritis.   DISCHARGE DIAGNOSIS: Right hip severe osteoarthritis.   PROCEDURE: Right total hip replacement.   SURGEON: Laurene Footman, MD   ANESTHESIA: Spinal.   ESTIMATED BLOOD LOSS: 500 mL.   COMPLICATIONS: None.   SPECIMEN: Removed femoral head and an inferior acetabular osteophyte.   IMPLANTS: Medacta AMI 4 lateralized stem with [name] Versafit cup DM size 52 mm with appropriate liner and an L 28 mm head.   CONDITION: To recovery room stable.   HISTORY OF PRESENT ILLNESS: The patient is [name] 74 year old female who presents for evaluation of right total hip replacement by Dr. Rudene Christians on 04/10/2014. She has had 6 years of progressive severe right hip pain. Pain is moderate to severe, located in the right buttocks as well as the right groin, which radiates into her anterior thigh. She has severe pain with activity as well as with rest. She has difficulty with getting in and out of vehicles as well as getting in and out of bed. She has been on chronic pain medication by PCP at [facility]. PCP has her on fentanyl patches 50 mcg q.72 hours. She denies any numbness or tingling in the lower extremities. She has had successful left total hip replacement by Dr. Mauri Pole in 2009.   PHYSICAL EXAMINATION: GENERAL: Well-developed, well-nourished female in no apparent distress. Normal affect. Mild antalgic component.  HEENT: Head normocephalic, atraumatic. Pupils equal, round and reactive to light.  ABDOMEN: Soft, nontender, nondistended. Bowel sounds are present.  HEART: Reveals regular rate and rhythm. There is no murmur noted on auscultation. There is [name] normal apical pulse.  LUNGS: Clear to auscultation. There is no wheeze, rhonchi or crackles.  RIGHT HIP: Shows the patient has tenderness over the anterior groin  and tenderness over the trochanteric bursa region. She has severe stiffness with internal and external rotation. She has pain with internal rotation. Hip abduction strength is 4 to 5. She is able to straight leg raise with the knee. She has no knee effusion, warmth or erythema. She has 5 out of 5 strength with ankle plantar flexion and dorsiflexion. She has negative Homans sign in the right lower extremity. She is neurovascularly intact in the right lower extremity.   HOSPITAL COURSE: The patient was admitted to the hospital on 04/10/2014. She had surgery that same day and was brought to the orthopedic floor from the PACU in stable condition. On postoperative day 1, she had [name] slight elevation in creatinine of 1.49 as well as [name] hemoglobin of 7.1. She was given [name] unit of packed red blood cells as well as gentle IV hydration, which did seem to get her creatinine down to 1.12 on postoperative day 2 as well as her hemoglobin up to 8 on postoperative day 2. On postoperative day 2, in the afternoon, her hemoglobin was rechecked, and it was 8.3 and then later 7.7. On postoperative day 2 in the evening, the patient was given [name] second unit of packed red blood cells. The patient was making good progress on postoperative day 2 with physical therapy. Her vital signs were doing well. The patient did have an episode of sinus tachycardia on June 19th, on postoperative day 2, in which her heart rate went into the 170s. MD consult was ordered, and it was found that she had sinus tachycardia likely from  anxiety and anemia. She was placed on telemetry. She continued with her antianxiety medications, and labetalol was increased to 200 mg b.i.d. TSH and other labs were within normal limits. On postoperative day 3, the patient was doing well. Hemoglobin was rechecked, and it was stable. Vital signs are stable. She had been doing well, making good progress with physical therapy. Her pain was controlled, and she was stable and ready for  discharge to rehab facility for continuation of PT.   CONDITION AT DISCHARGE: Stable.   DISCHARGE INSTRUCTIONS: The patient can increase weight-bearing on the affected extremity. Elevate the affected foot or leg on 1 or 2 pillows with the foot higher than the knee. Thigh-high TED hose on both legs and remove 1 hour per 8-hour shift. Elevate the heels off the bed. Use incentive spirometer every hour while awake and encourage cough and deep breathing. You may resume [name] regular diet as tolerated. Apply an ice pack to the affected area. Do not get the dressing or bandage wet or dirty. Call [facility] if the dressing gets water under it, if there is any bright red bleeding from the incision or wound, fever above 101.5 degrees, redness, swelling or drainage at the incision. Call [facility] if you experience any increased leg pain, numbness or weakness in your legs or bowel or bladder symptoms. The patient is going to be discharged to rehab facility for continuation of PT and OT. Follow up with [facility] in 2 weeks.   DISCHARGE MEDICATIONS: Please see discharge instructions for discharge medications.   ____________________________ T. Rachelle Hora, PA-C tcg:lb D: 04/13/2014 09:32:43 ET T: 04/13/2014 09:58:13 ET JOB#: [id number]  cc: T. Rachelle Hora, PA-C, <Dictator> Duanne Guess [place] ELECTRONICALLY SIGNED 04/20/2014 0:06

## 2015-02-15 NOTE — Consult Note (Signed)
PATIENT NAME:  Sabrina Sabrina [name], Sabrina Sabrina [name] MR#:  [id number] DATE OF BIRTH:  [birth year]  DATE OF CONSULTATION:  04/12/2014  CONSULTING PHYSICIAN:  Sital P. Benjie Karvonen, MD  PRIMARY CARE PHYSICIAN: [place],[facility] PRIMARY CARDIOLOGIST:  Dr. Clayborn Bigness.   REFERRING PHYSICIAN:  Dr. Rudene Christians.   REASON FOR REQUEST:  1.  Depression   2.  Sinus tachycardia likely from anxiety, anemia and incorrect dose of labetalol. The patient is not hypoxic. She is on Plavix and aspirin, as per Dr. Rudene Christians, for deep vein thrombosis prophylaxis. No lower extremity edema, unlikely pulmonary embolism.  3.  Postop day #2 total hip repair. 4.  Coronary artery disease/coronary artery bypass graft.  5.  Anxiety. 6.  Hypertension. 7.  Hyperlipidemia. 8.  Constipation.  PLAN: 1.  Continue telemetry.  2.  Transfuse if hemoglobin less than 8.  3.  Continue antianxiety medications p.r.n.  4.  Add labetalol 200 mg b.i.d. per her home med list. 5.  Check TSH.  6.  Continue all other outpatient medications.  7.  For her constipation, would continue  aggressive laxatives.  Her last bowel movement was 3 days ago. 8.  Would also encourage the use of incentive spirometer to prevent atelectasis and pneumonia.    HISTORY OF PRESENT ILLNESS: This is Sabrina [name] very pleasant 74 year old female who presented on the 17th for right hip severe osteoarthritis, had right total right total hip. Hospitalist was consulted due to sinus tachycardia. It was noted that the patient is on labetalol 200  b.i.d., but is only labetalol 200 daily. The patient says that when she was having sinus tachycardia with her heart rates in the 170s, she had some palpitations, but no other symptoms such as shortness of breath or chest pain. She was anxious. She received some Xanax and her heart rate improved.    REVIEW OF SYSTEMS:   CONSTITUTIONAL:  No fever, chills, fatigue, weakness.  EYES:  No blurred or double vision or glaucoma.  ENT:  No ear pain, hearing loss, seasonal  allergies. No postnasal drip. RESPIRATORY:  No cough, wheezing, hemoptysis, dyspnea, asthma.  CARDIOVASCULAR:  No chest pain orthopnea, edema, arrhythmia or dyspnea on exertion. She did feel palpitations at that time. No syncope.  GASTROINTESTINAL:  No nausea, vomiting, diarrhea, abdominal pain, melena or ulcers. She does have Sabrina [name] decreased appetite.  GENITOURINARY:   No dysuria or hematuria.  ENDOCRINE:  No polyuria or polydipsia. HEMATOLOGIC AND LYMPHATICS:  Positive anemia. SKIN:  No rash or lesions.  MUSCULOSKELETAL:  Limited activity due to recent surgery.  NEUROLOGIC:  No history of CVA, TIA or seizures. PSYCHIATRIC:  She does have anxiety.  PAST MEDICAL HISTORY: 1.  Hypertension.  2.  CAD/CABG.  3.  Hyperlipidemia.  4.  Anxiety.   MEDICATIONS:  1.  Fentanyl 50 mcg q.72 hours.  2.  Tramadol 50 mg q.6 hours p.r.n.   3.  Lisinopril 10 mg at bedtime.  4.  Clonidine 0.1 mg t.i.d.  5.  Zetia 10 mg daily.  6.  Aspirin 81 mg daily.  7.  Plavix 75 mg daily.  8.  Cilostazol 100 mg at bedtime.  9.  Librium 25 mg as needed p.r.n.  10.  Labetalol 200 b.i.d.  11.  Norvasc 5 mg daily.  12.  Fish oil 1000 mg daily.  13.  Nexium 40 mg daily.  14.  Hydralazine 50 mg t.i.d.  15.  Multivitamin 1 tablet daily.   ALLERGIES:   1.  DILAUDID.  2.  NUBAIN.    3.  PENICILLIN.    4.  SOME STATINS.   SOCIAL HISTORY: No tobacco, alcohol or IV drug use.   FAMILY HISTORY:   Positive for hypertension.   PAST SURGICAL HISTORY:   1.  Carotid surgery.  2.  Hysterectomy. 3.  Cholecystectomy. 4.  Coronary artery stenting and CABG.  5.  Right foot little toe amputation. 6.  Left hip replacement in 2008.  7.  Right hip replacement in 2015.   PHYSICAL EXAMINATION:  VITAL SIGNS: Temperature 99.8, heart rate 100, but it was in the 170s. Blood pressure 121/66, 95% on room air.  GENERAL:  The patient is alert, oriented, not in acute distress.  HEENT:  Head is atraumatic.  Pupils are round and  reactive.  Sclerae anicteric.  Mucous membranes  are moist.  Oropharynx is clear.   NECK:  Supple without JVD, carotid bruit or enlarged thyroid.  CARDIOVASCULAR:  Tachycardia. No murmurs, gallops or rubs. PMI is not displaced.  LUNGS:  Clear to auscultation without crackles, rales, rhonchi or wheezing. Normal to percussion.  ABDOMEN:  Bowel sounds are positive.  Nontender, nondistended. No hepatosplenomegaly.  EXTREMITIES: No clubbing, cyanosis or edema.  NEUROLOGIC:  Cranial nerves II through XII are intact. There are no focal deficits.  SKIN: Without rash or lesions.   LABORATORY DATA:  Hemoglobin this morning was 8.3. Sodium 134, potassium 4.4, chloride 101, bicarb 27, BUN 16, creatinine 1.12, glucose 90.   EKG was not performed during the episode.    TIME SPENT:  Approximately 50 minutes.    ____________________________ Donell Beers. Benjie Karvonen, MD spm:dmm D: 04/12/2014 12:40:00 ET T: 04/12/2014 13:04:43 ET JOB#: [id number]  cc: Sital P. Benjie Karvonen, MD, <Dictator>  Donell Beers MODY MD ELECTRONICALLY SIGNED 04/12/2014 15:24

## 2015-02-15 NOTE — Consult Note (Signed)
Nubain: GI Distress, Headaches, Other  Statins: GI Distress, Headaches, Other  PCN: Itching  Dilaudid: N/V/Diarrhea, GI Distress   Impression 1 sinus tachycardia likley from anxiety and anemia and incorrect dose of labetolol,  not hypoxic on plavix and ASA per dr Rudene Christians, no Hollace Hayward PE  2. POD #2 THR 3. CAD/CABG 4. anxiety 5, HTN 6. HLD   Plan 1 continue telemetry, tx if hgb<8 2. cont anti anxiety meds PRN 3. labetolol 200 mg BID per home med list 4. check TSH 5.cont outpt meds  thank you will follow [phone]   Electronic Signatures: Bettey Costa (MD)  (Signed [phone] 12:35)  Authored: Allergies, Impression/Plan   Last Updated: 19-Jun-15 12:35 by Bettey Costa (MD)

## 2015-02-15 NOTE — Op Note (Signed)
PATIENT NAME:  Sabrina [name], Sabrina [name] MR#:  [id number] DATE OF BIRTH:  1941/10/25  DATE OF PROCEDURE:  04/10/2014  PREOPERATIVE DIAGNOSIS: Right hip severe osteoarthritis.   POSTOPERATIVE DIAGNOSIS: Right hip severe osteoarthritis.  PROCEDURE: Right total hip replacement.   SURGEON: Hessie Knows, M.D.   ANESTHESIA: Spinal.   DESCRIPTION OF PROCEDURE: The patient was brought to the operating room and after adequate anesthesia was obtained, the patient was placed on the operative table with the left leg in [name] well-padded table, right leg in the Medacta attachment. C-arm was brought in and good visualization and preop x-ray taken for templating. The hip was then prepped and draped in the usual sterile fashion with appropriate patient identification and timeout procedures completed. Direct anterior approach was made centered over the greater trochanter and skin and subcutaneous tissue were divided. Hemostasis was maintained with electrocautery. The tensor muscle was opened  and the muscle retracted laterally. Deep fascia was then incised and deep retractors placed. The lateral femoral circumflex vessels were small and were cauterized. The anterior capsule was opened and the neck exposed. The femoral neck cut was carried out and the head removed showing severe osteoarthritis. The pulvinar was debrided from the central aspect of the hip and the labrum excised. Reaming was carried out to 52 mm and the cup was medialized giving good bleeding bone. Anterior osteophytes were removed with the use of an osteotome and rongeur. [name] 52 mm trial fit well and the 50 mm cup was very stable. It was impacted into the appropriate position.   Next, the pubofemoral and ischiofemoral ligaments were released as they were very tight to get adequate external rotation dropping the leg into extension. The proximal femur was opened and sequentially broached to [name] number 4. [name] #4 trial was utilized beneath the lateral offset to restore  anatomy with medialization of the cup. With [name] lateralized stem, leg length  was restored with [name] size L head. The final components were placed with [name] size 4 lateralized AMIS stem impacted into position, followed by the dual mobility cup. The hip was reduced and was stable to 90 degrees external rotation test. X-ray at this time showed [name] few millimeters extra length, which was desirable with her being somewhat short preop. Exparel diluted with saline as well as 0.25% Sensorcaine with epinephrine was infiltrated in the periarticular tissues to aid in postoperative analgesia. The wound was closed with [name] heavy quill for the deep fascia, [name] subcutaneous drain, followed by 2-0 subcutaneous quill and skin staples. Xeroform, 4 x 4's, ABD and tape were applied. The patient was sent to the recovery room in stable condition.   ESTIMATED BLOOD LOSS: 500 mL.   COMPLICATIONS: None.   SPECIMEN: Removed femoral head and an inferior acetabular osteophyte.   IMPLANTS: Medacta AMIS 4 lateralized stem with [name] Versafit cup DM size 52 with appropriate liner and an L, 28 mm head.   CONDITION: To recovery room stable.    ____________________________ Laurene Footman, MD [email or web address] D: 04/10/2014 12:14:46 ET T: 04/10/2014 12:27:42 ET JOB#: [id number]  cc: Laurene Footman, MD, <Dictator> Laurene Footman MD ELECTRONICALLY SIGNED 04/10/2014 14:31

## 2015-02-16 NOTE — Discharge Summary (Signed)
PATIENT NAME:  Sabrina [name], Sabrina [name] MR#:  [id number] DATE OF BIRTH:  1941-06-02  DATE OF ADMISSION:  01/30/2012 DATE OF DISCHARGE:  01/30/2012  ADMITTING PHYSICIAN: Sabrina Lund, MD  DISCHARGING PHYSICIAN: Sabrina Lighter, MD  PRIMARY CARE PHYSICIAN: Sabrina Pugh, MD   PRIMARY CARDIOLOGIST: Sabrina Amel, MD  CONSULTANTS: None.   DISCHARGE DIAGNOSES:  1. Stable angina.  2. Coronary artery disease.  3. Hypertension.  4. Hyperlipidemia.  5. Panic attack. 6. History of coronary artery bypass graft surgery.  7. Hyperlipidemia.   DISCHARGE MEDICATIONS:  1. Pantoprazole 40 mg p.o. daily.  2. Librium 25 mg p.o. twice [name] day. 3. Norvasc 5 mg p.o. daily.  4. Labetalol 200 mg p.o. twice [name] day. 5. HCTZ/losartan 12.5 mg/50 mg 1 tablet p.o. daily.  6. Pravachol 5 mg p.o. daily. 7. Pletal 100 mg p.o. twice [name] day. 8. Catapres 0.1 mg p.o. daily.  9. Multivitamin 1 tablet p.o. daily.  10. Aspirin 81 mg p.o. daily.   DISCHARGE HOME OXYGEN: None.   DISCHARGE DIET: Low sodium diet.   DISCHARGE ACTIVITY: As tolerated.   FOLLOWUP INSTRUCTIONS: Primary care physician follow-up in 1 to 2 weeks. Needs adjustment of her blood pressure medications. Followup with Dr. Clayborn [name] in 1 week.  LABS AND IMAGING STUDIES: WBC 7.5, hemoglobin 9.7, hematocrit 34.8, and platelet count 284.   Sodium 136, potassium 3.7, chloride 98, bicarbonate 32, BUN 19, creatinine 1.23, glucose 91, and calcium 9.2.   ALT 22, AST 31, alkaline phosphatase 79, total bilirubin 0.5, and albumin 3.9. Troponin, three sets have been less than 0.05. CK and CK-MB within normal limits.   Chest x-ray is showing no acute cardiopulmonary disease.   BRIEF HOSPITAL COURSE: [name] is [name] 74 year old African American female with past medical history significant for coronary artery disease status post bypass graft surgery and hypertension who also has history of panic attacks who presented to the hospital complaining of exertional  chest pain.  1. Chest pain: This lasted for [name] few seconds, nonradiating, and not associated with any other symptoms, which occurred 5 to 6 times prior to admission. The patient said she also had some anxiety episodes prior to that and she was under [name] lot of stress. Her recent stress test in Dr. Etta [name] office six months ago was negative. She was admitted under observation. Three sets of troponins were negative, so she was ruled out, and she has been chest pain-free. So she was advised to follow-up with Dr. Clayborn [name] as an outpatient for outpatient stress test.  2. Hypertension: All her home medications were continued without any changes. She was also advised to follow-up with her PCP to see if some of the medications, blood pressure medications, can be adjusted as she is on [name] lot of blood pressure medications, at their lower dose. Her course has been otherwise uneventful in the hospital.   DISCHARGE CONDITION: Stable.      DISCHARGE DISPOSITION: Home.   TIME SPENT ON DISCHARGE: 45 minutes.  ____________________________ Sabrina Lighter, MD rk:slb D: 02/02/2012 13:10:12 ET T: 02/03/2012 12:11:20 ET JOB#: [id number]  cc: Sabrina Lighter, MD, <Dictator> Sabrina [name]. Sabrina Greathouse, MD Sabrina D. Sabrina Bigness, MD Sabrina Lighter MD ELECTRONICALLY SIGNED 02/04/2012 14:22

## 2015-02-16 NOTE — H&P (Signed)
PATIENT NAME:  Sabrina [name], Sabrina [name] MR#:  [id number] DATE OF BIRTH:  Jun 01, 1941  DATE OF ADMISSION:  01/30/2012  REASON FOR HOSPITAL VISIT: Chest discomfort.  HISTORY OF PRESENT ILLNESS: This is [name] 74 year old African American female with significant past medical history of: 1. Coronary artery disease status post CABG more than 10 years ago. 2. History of hypertension under poor control. 3. History of dyslipidemia under poor control. 4. History of anxiety. 5. CABG in 2000. 6. Carotid surgery in the past. 7. Hysterectomy. 8. Tonsillectomy. 9. Cholecystectomy. 10. Coronary artery stenting. 11. Right foot little toe amputation. 12. Left hip replacement in 2008.  This is [name] pleasant 74 year old African American female with history of CAD with CABG and stent in the past who has had [name] negative stress test done by Dr. Clayborn Bigness six months ago in the office, the patient says that today she did some exertional activity, i.e. she cleaned her car. Around that time she started experiencing some substernal chest tightness. She describes this as [name] feeling of tightness with each episode lasting 10 to 15 seconds, this was nonradiating, not associated with nausea, shortness of breath, or any other symptoms, these episodes occurred about five to six times today, however they were occurring at rest. She was not exerting at that time specifically, but she had cleaned her car earlier in the day, she denies any recent travel, denies any swelling in her legs, and denies any pleuritic component to the discomfort. She presented to the ER where her EKG was unremarkable. Her first set of troponin was negative and I was called to admit the patient.   PAST MEDICAL HISTORY/PAST SURGICAL HISTORY: As above.  PERSONAL HISTORY: Quit smoking more than 10 years ago.   FAMILY HISTORY: Positive for coronary artery disease in several close family members including mom, dad, and sister.    HOME MEDICATIONS: 1. Pravachol 5 mg p.o.  daily. 2. Protonix 40 mg p.o. daily. 3. Vitamin one daily. 4. Librium 25 mg p.o. every 12 hours. 5. Labetalol 200 mg every 12 hours. 6. HCTZ one p.o. daily. 7. Fentanyl patch 75 mcg every three days. 8. Cilostazol 100 mg p.o. daily. 9. Catapres 0.1 mg p.o. daily. 10. Aspirin 81 mg p.o. daily. 11. Norvasc 5 mg p.o. daily.  DRUG ALLERGIES: IV anesthetic causes headache and abdominal cramp, Nubain, penicillin, and few statins.   PHYSICAL EXAMINATION:   VITALS: Temperature 97.8, pulse 82, respirations 18, blood pressure 158/65, and 99% on room air.  GENERAL: Middle-aged, pleasant African American female lying in the hospital bed, in no apparent discomfort.  HEENT: Normocephalic, atraumatic head. Pupils are equally round and reactive to light. Pink and moist. Tongue and throat no scleral icterus.   NECK: No JVD. Supple neck.  CNS: Alert, awake and oriented x3. Cranial nerves intact. No focal neurological deficits.   PSYCH: Insight is intact. Not suicidal or homicidal.  LUNGS: Chest wall movement bilaterally symmetrical. Good air movement bilaterally. No rhonchi. No wheezes.   CVS: Regular rate and rhythm. Normal S1 and S2. No gallops or murmurs.  ABDOMEN: Obese, soft. Positive bowel sounds. Nontender.   EXTREMITIES: No cyanosis, clubbing, or edema.   LABS/STUDIES: Latest labs include white count 7.5, hemoglobin 11.7, hematocrit 34.8, and platelets 284. Sodium 136, potassium 3.7, chloride 98, bicarbonate 32, BUN 19, creatinine 1.2, and glucose 91. Liver enzymes within normal limits. Troponin I 0.05.   Chest x-ray unremarkable.  EKG: Sinus rhythm, rate 83 beats per minute. QTC 441. Nonspecific EKG changes in inferior lead.  ASSESSMENT AND PLAN:  1. Atypical chest pain in [name] patient with history of coronary artery disease, CABG, and stenting in the past who has poorly controlled hypertension and dyslipidemia. The plan is to keep the patient on 23 hour observation, we will obtain  three sets of cardiac enzymes, she is currently pain and symptom free, we will obtain echocardiogram, we will continue her beta blocker and statin, we will order an echocardiogram to look for wall motion abnormalities. The patient recently had [name] negative stress test within the last 6 months, in Dr. Etta Quill office. Please consult cardiology in the morning and decide for the best testing modality at this time. We will increase her aspirin to full dose, 325 mg daily. 2. History of hypertension. Currently blood pressure is stable. We will increase Catapres to twice [name] day from once [name] day for better blood pressure control and to avoid fluctuations. We will continue labetalol along with HCTZ and Norvasc with holding parameters.  3. History of dyslipidemia. We will continue Pravachol 5 mg per home dose, outpatient cholesterol monitoring.   4. The patient has received Lovenox. Of note, full dose, in the ER x1. At this time, since the patient is pain free, I will not extend this further. If she stays beyond 24 hours, DVT prophylaxis should be considered.  ____________________________ Margaree Mackintosh. Candiss Norse, MD pks:slb D: 01/30/2012 03:51:21 ET T: 01/30/2012 11:45:17 ET JOB#: [id number]  cc: Deno Etienne K. Candiss Norse, MD, <Dictator> Margaree Mackintosh [facility] MD ELECTRONICALLY SIGNED 01/30/2012 23:33

## 2015-02-28 DIAGNOSIS — H25812 Combined forms of age-related cataract, left eye: Secondary | ICD-10-CM | POA: Diagnosis not present

## 2015-02-28 DIAGNOSIS — Z961 Presence of intraocular lens: Secondary | ICD-10-CM | POA: Diagnosis not present

## 2015-02-28 DIAGNOSIS — H2512 Age-related nuclear cataract, left eye: Secondary | ICD-10-CM | POA: Diagnosis not present

## 2015-03-05 ENCOUNTER — Ambulatory Visit (INDEPENDENT_AMBULATORY_CARE_PROVIDER_SITE_OTHER): Payer: Medicare Other | Admitting: Internal Medicine

## 2015-03-05 ENCOUNTER — Encounter: Payer: Self-pay | Admitting: Internal Medicine

## 2015-03-05 VITALS — BP 130/68 | HR 60 | Temp 98.2°F | Resp 14 | Ht 61.0 in | Wt 155.5 lb

## 2015-03-05 DIAGNOSIS — I1310 Hypertensive heart and chronic kidney disease without heart failure, with stage 1 through stage 4 chronic kidney disease, or unspecified chronic kidney disease: Secondary | ICD-10-CM | POA: Diagnosis not present

## 2015-03-05 DIAGNOSIS — G8929 Other chronic pain: Secondary | ICD-10-CM

## 2015-03-05 DIAGNOSIS — M25551 Pain in right hip: Secondary | ICD-10-CM | POA: Diagnosis not present

## 2015-03-05 DIAGNOSIS — N183 Chronic kidney disease, stage 3 unspecified: Secondary | ICD-10-CM

## 2015-03-05 DIAGNOSIS — I10 Essential (primary) hypertension: Secondary | ICD-10-CM

## 2015-03-05 MED ORDER — FENTANYL 50 MCG/HR TD PT72: 50.0000 ug | MEDICATED_PATCH | TRANSDERMAL | Status: DC

## 2015-03-05 MED ORDER — AMLODIPINE BESYLATE 10 MG PO TABS: 10.0000 mg | ORAL_TABLET | Freq: Every day | ORAL | Status: DC

## 2015-03-05 MED ORDER — LISINOPRIL 20 MG PO TABS: 20.0000 mg | ORAL_TABLET | Freq: Every day | ORAL | Status: DC

## 2015-03-05 MED ORDER — CLOPIDOGREL BISULFATE 75 MG PO TABS: 75.0000 mg | ORAL_TABLET | Freq: Once | ORAL | Status: DC

## 2015-03-05 MED ORDER — EZETIMIBE 10 MG PO TABS
10.0000 mg | ORAL_TABLET | Freq: Every day | ORAL | Status: DC
Start: 2015-03-05 — End: 2016-01-19

## 2015-03-05 MED ORDER — TRAMADOL HCL 50 MG PO TABS: 50.0000 mg | ORAL_TABLET | Freq: Four times a day (QID) | ORAL | Status: DC | PRN

## 2015-03-05 MED ORDER — LABETALOL HCL 200 MG PO TABS: 100.0000 mg | ORAL_TABLET | Freq: Two times a day (BID) | ORAL | Status: DC

## 2015-03-05 MED ORDER — ESOMEPRAZOLE MAGNESIUM 40 MG PO CPDR: 40.0000 mg | DELAYED_RELEASE_CAPSULE | Freq: Every day | ORAL | Status: DC

## 2015-03-05 MED ORDER — HYDRALAZINE HCL 50 MG PO TABS: 50.0000 mg | ORAL_TABLET | Freq: Three times a day (TID) | ORAL | Status: DC | PRN

## 2015-03-05 NOTE — Progress Notes (Signed)
Patient ID: Sabrina [name], female   DOB: 1941/07/05, 74 y.o.   MRN: [id number]   Patient Active Problem List   Diagnosis Date Noted  . GERD (gastroesophageal reflux disease) 12/06/2014  . Abdominal pain, epigastric 11/14/2014  . Chronic right hip pain 11/14/2014  . Generalized anxiety disorder 09/08/2014  . 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 Peripheral vascular disease 08/03/2014  . CAD (coronary artery disease) 08/03/2014  . HLD (hyperlipidemia) 06/24/2014  . Chronic narcotic use 06/24/2014  . Need for prophylactic vaccination and inoculation against influenza 06/24/2014  . Need for vaccination with 13-polyvalent pneumococcal conjugate vaccine 12/28/2013  . Aortic systolic murmur on examination 12/28/2013  . Hip pain, bilateral 12/27/2013  . Low back pain 12/27/2013  . HTN (hypertension) 12/27/2013    Subjective:  CC:   Chief Complaint  Patient presents with  . Acute Visit    Dizziness, Taking the amlodipine at night and dizziness is better.    HPI:   Sabrina [name] is a 74 y.o. female who presents for Follow up on malaise,  Hypertension, chronic pain  2 weeks ago patient called the office for an  Appoint because she feelt so bad she "thought she was dying," and was given an appt today.   Nothing documented in the chart.   Was having persistent dizziness and  headaches.  Fortunately the Headaches have improved in frequency since she had her bilateral cataract surgery.  She has been taking amlodipine at night  And tolerating the dizziness   Much better,  Her BP much better controlled,  Each  elevated episode is accompanied by a headache,  Not in the same place .  Having some left sided neck pain,  Not severe       Past Medical History  Diagnosis Date  . CAD (coronary artery disease)   . Hypertension   . Hyperlipidemia   . Renal artery stenosis   . Carotid artery stenosis   . Anxiety   .  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 Subclavian arterial stenosis   . Chronic kidney disease, stage III (moderate)     Followed by Dr. Juleen China  . Anemia of chronic kidney failure   . Secondary hyperparathyroidism     Past Surgical History  Procedure Laterality Date  . Cholecystectomy    . Tonsillectomy and adenoidectomy    . Abdominal hysterectomy  1976  . Total hip arthroplasty Left   . Carotid artery angioplasty Left   . Coronary angioplasty with stent placement  2000  . Toe amputation Right     small toe  . Cystoscopy with stent placement Bilateral   . Total hip arthroplasty Right 2015  . Coronary artery bypass graft  2000  . Carotid endarterectomy Left   . Renal artery angioplasty Bilateral mARCH 2015       The following portions of the patient's history were reviewed and updated as appropriate: Allergies, current medications, and problem list.    Review of Systems:   Patient denies headache, fevers, malaise, unintentional weight loss, skin rash, eye pain, sinus congestion and sinus pain, sore throat, dysphagia,  hemoptysis , cough, dyspnea, wheezing, chest pain, palpitations, orthopnea, edema, abdominal pain, nausea, melena, diarrhea, constipation, flank pain, dysuria, hematuria, urinary  Frequency, nocturia, numbness, tingling, seizures,  Focal weakness, Loss of consciousness,  Tremor, insomnia, depression, anxiety, and suicidal ideation.     History   Social History  . Marital  Status: Divorced    Spouse Name: N/A  . Number of Children: N/A  . Years of Education: N/A   Occupational History  . Not on file.   Social History Main Topics  . Smoking status: Former Smoker    Quit date: 10/25/1998  . Smokeless tobacco: Never Used  . Alcohol Use: No  . Drug Use: No  . Sexual Activity: Not Currently   Other Topics Concern  . Not on file   Social History Narrative    Objective:  Filed Vitals:   03/05/15 1505  BP: 130/68  Pulse: 60   Temp: 98.2 F (36.8 C)  Resp: 14     General appearance: alert, cooperative and appears stated age Ears: normal TM's and external ear canals both ears Throat: lips, mucosa, and tongue normal; teeth and gums normal Neck: no adenopathy, no carotid bruit, supple, symmetrical, trachea midline and thyroid not enlarged, symmetric, no tenderness/mass/nodules Back: symmetric, no curvature. ROM normal. No CVA tenderness. Lungs: clear to auscultation bilaterally Heart: regular rate and rhythm, S1, S2 normal, no murmur, click, rub or gallop Abdomen: soft, non-tender; bowel sounds normal; no masses,  no organomegaly Pulses: 2+ and symmetric Skin: Skin color, texture, turgor normal. No rashes or lesions Lymph nodes: Cervical, supraclavicular, and axillary nodes normal.  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 Renal function stable, no changes today.  Lab Results  Component Value Date   CREATININE 1.12 11/14/2014   Lab Results  Component Value Date   NA 138 11/14/2014   K 4.3 11/14/2014   CL 102 11/14/2014   CO2 30 11/14/2014   .   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 Lumbar spine films are negative for cause.   She has requested refill of Fentanyl patch.  She cannot take NSAIDs. The risks and benefits of chronic narcortic use were discussed with patient today including excessive sedation leading to respiratory depression,  impaired thinking/driving, and addiction.  Patient was advised to avoid concurrent use with alcohol, to use medication only as needed and not to share with others  .  Marland Kitchen      A total of 25 minutes of face to face time was spent with patient more than half of which was spent in counselling about the above mentioned conditions  and coordination of care   Updated Medication List Outpatient Encounter Prescriptions as of 03/05/2015   Medication Sig  . amLODipine (NORVASC) 10 MG tablet Take 1 tablet (10 mg total) by mouth daily.  Marland Kitchen aspirin 81 MG tablet Take 81 mg by mouth daily.  . chlordiazePOXIDE (LIBRIUM) 25 MG capsule Take 1 capsule (25 mg total) by mouth daily as needed for anxiety.  . clopidogrel (PLAVIX) 75 MG tablet Take 1 tablet (75 mg total) by mouth once.  Marland Kitchen esomeprazole (NEXIUM) 40 MG capsule Take 1 capsule (40 mg total) by mouth daily.  Marland Kitchen ezetimibe (ZETIA) 10 MG tablet Take 1 tablet (10 mg total) by mouth daily.  . fentaNYL (DURAGESIC - DOSED MCG/HR) 50 MCG/HR Place 1 patch (50 mcg total) onto the skin every 3 (three) days.  . hydrALAZINE (APRESOLINE) 50 MG tablet Take 1 tablet (50 mg total) by mouth 3 (three) times daily as needed.  . labetalol (NORMODYNE) 200 MG tablet Take 0.5 tablets (100 mg total) by mouth 2 (two) times daily.  Marland Kitchen lisinopril (PRINIVIL,ZESTRIL) 20 MG tablet Take 1 tablet (20 mg  total) by mouth daily.  . mometasone (NASONEX) 50 MCG/ACT nasal spray Place 2 sprays into the nose daily.  . Multiple Vitamin (MULTIVITAMIN WITH MINERALS) TABS tablet Take 1 tablet by mouth daily.  . Omega-3 Fatty Acids (FISH OIL) 1000 MG CAPS Take 1 capsule by mouth daily.  . traMADol (ULTRAM) 50 MG tablet Take 1 tablet (50 mg total) by mouth every 6 (six) hours as needed.  . [DISCONTINUED] amLODipine (NORVASC) 10 MG tablet Take 1 tablet (10 mg total) by mouth daily.  . [DISCONTINUED] clopidogrel (PLAVIX) 75 MG tablet Take 1 tablet (75 mg total) by mouth once.  . [DISCONTINUED] esomeprazole (NEXIUM) 40 MG capsule Take 1 capsule (40 mg total) by mouth daily.  . [DISCONTINUED] ezetimibe (ZETIA) 10 MG tablet Take 1 tablet (10 mg total) by mouth daily.  . [DISCONTINUED] fentaNYL (DURAGESIC - DOSED MCG/HR) 50 MCG/HR Place 1 patch (50 mcg total) onto the skin every 3 (three) days.  . [DISCONTINUED] hydrALAZINE (APRESOLINE) 50 MG tablet Take 1 tablet (50 mg total) by mouth 3 (three) times daily as needed.  . [DISCONTINUED]  labetalol (NORMODYNE) 200 MG tablet Take 0.5 tablets (100 mg total) by mouth 2 (two) times daily.  . [DISCONTINUED] lisinopril (PRINIVIL,ZESTRIL) 20 MG tablet TAKE ONE TABLET BY MOUTH ONCE DAILY  . [DISCONTINUED] traMADol (ULTRAM) 50 MG tablet Take 1 tablet (50 mg total) by mouth every 6 (six) hours as needed.  . diazepam (VALIUM) 10 MG tablet Take 1 tablet (10 mg total) by mouth daily as needed for anxiety (May Use 0.5 tablet PRN). (Patient not taking: Reported on 03/05/2015)   No facility-administered encounter medications on file as of 03/05/2015.     No orders of the defined types were placed in this encounter.    No Follow-up on file.

## 2015-03-05 NOTE — Progress Notes (Signed)
Pre-visit discussion using our clinic review tool. No additional management support is needed unless otherwise documented below in the visit note.  

## 2015-03-05 NOTE — Patient Instructions (Addendum)
You can use aleve for headaches if it does not raise your BP . If it does,  Use the tramadol instead   The pain in your ear is pressure building up from the burping   You can try  taking Beano with every vegetable containing meal to prevent gas   I have refilled everything for 90 days \  Continue your allergy medication through the entire summer   Debrox drops for left ear wax issues  2 drops at night

## 2015-03-07 DIAGNOSIS — N2581 Secondary hyperparathyroidism of renal origin: Secondary | ICD-10-CM | POA: Diagnosis not present

## 2015-03-07 DIAGNOSIS — N183 Chronic kidney disease, stage 3 (moderate): Secondary | ICD-10-CM | POA: Diagnosis not present

## 2015-03-07 DIAGNOSIS — I129 Hypertensive chronic kidney disease with stage 1 through stage 4 chronic kidney disease, or unspecified chronic kidney disease: Secondary | ICD-10-CM | POA: Diagnosis not present

## 2015-03-07 DIAGNOSIS — I701 Atherosclerosis of renal artery: Secondary | ICD-10-CM | POA: Diagnosis not present

## 2015-03-07 DIAGNOSIS — R809 Proteinuria, unspecified: Secondary | ICD-10-CM | POA: Diagnosis not present

## 2015-03-08 ENCOUNTER — Encounter: Payer: Self-pay | Admitting: Internal Medicine

## 2015-03-08 NOTE — Assessment & Plan Note (Addendum)
Secondary  to severer DJD right hip . Lumbar spine films are negative for cause.   She has requested refill of Fentanyl patch.  She cannot take NSAIDs. The risks and benefits of chronic narcortic use were discussed with patient today including excessive sedation leading to respiratory depression,  impaired thinking/driving, and addiction.  Patient was advised to avoid concurrent use with alcohol, to use medication only as needed and not to share with others  .  Marland Kitchen

## 2015-03-08 NOTE — Assessment & Plan Note (Addendum)
Well controlled on current regimen. Renal function stable, no changes today.  Lab Results  Component Value Date   CREATININE 1.12 11/14/2014   Lab Results  Component Value Date   NA 138 11/14/2014   K 4.3 11/14/2014   CL 102 11/14/2014   CO2 30 11/14/2014   .

## 2015-03-20 ENCOUNTER — Telehealth: Payer: Self-pay | Admitting: Internal Medicine

## 2015-03-20 MED ORDER — ESOMEPRAZOLE MAGNESIUM 40 MG PO CPDR
40.0000 mg | DELAYED_RELEASE_CAPSULE | Freq: Every day | ORAL | Status: DC
Start: 2015-03-20 — End: 2016-03-23

## 2015-03-20 NOTE — Telephone Encounter (Signed)
Script changed top 90 day supply and sent to [facility]

## 2015-04-21 ENCOUNTER — Other Ambulatory Visit: Payer: Self-pay

## 2015-04-23 ENCOUNTER — Other Ambulatory Visit: Payer: Self-pay | Admitting: Internal Medicine

## 2015-04-23 NOTE — Telephone Encounter (Signed)
Patient called requesting refill on Fentanyl patch last OV 03/05/15, Please advise.

## 2015-04-24 MED ORDER — FENTANYL 50 MCG/HR TD PT72: 50.0000 ug | MEDICATED_PATCH | TRANSDERMAL | Status: DC

## 2015-04-24 NOTE — Telephone Encounter (Signed)
Ok to refill, for 2 months   printed rx .  Will require 3 month follow up for future refills per new controlled substance policy of Dr Derrel Nip

## 2015-05-19 ENCOUNTER — Encounter: Payer: Self-pay | Admitting: Internal Medicine

## 2015-05-19 ENCOUNTER — Ambulatory Visit (INDEPENDENT_AMBULATORY_CARE_PROVIDER_SITE_OTHER): Payer: Medicare Other | Admitting: Internal Medicine

## 2015-05-19 VITALS — BP 176/78 | HR 64 | Temp 98.3°F | Resp 12 | Ht 61.0 in | Wt 155.5 lb

## 2015-05-19 DIAGNOSIS — R739 Hyperglycemia, unspecified: Secondary | ICD-10-CM

## 2015-05-19 DIAGNOSIS — I2510 Atherosclerotic heart disease of native coronary artery without angina pectoris: Secondary | ICD-10-CM

## 2015-05-19 DIAGNOSIS — M5417 Radiculopathy, lumbosacral region: Secondary | ICD-10-CM

## 2015-05-19 DIAGNOSIS — I739 Peripheral vascular disease, unspecified: Secondary | ICD-10-CM

## 2015-05-19 DIAGNOSIS — M545 Low back pain: Secondary | ICD-10-CM

## 2015-05-19 DIAGNOSIS — D509 Iron deficiency anemia, unspecified: Secondary | ICD-10-CM | POA: Diagnosis not present

## 2015-05-19 DIAGNOSIS — Z79891 Long term (current) use of opiate analgesic: Secondary | ICD-10-CM | POA: Diagnosis not present

## 2015-05-19 DIAGNOSIS — R42 Dizziness and giddiness: Secondary | ICD-10-CM

## 2015-05-19 DIAGNOSIS — N183 Chronic kidney disease, stage 3 unspecified: Secondary | ICD-10-CM

## 2015-05-19 DIAGNOSIS — Z889 Allergy status to unspecified drugs, medicaments and biological substances status: Secondary | ICD-10-CM

## 2015-05-19 DIAGNOSIS — I1310 Hypertensive heart and chronic kidney disease without heart failure, with stage 1 through stage 4 chronic kidney disease, or unspecified chronic kidney disease: Secondary | ICD-10-CM

## 2015-05-19 DIAGNOSIS — R7309 Other abnormal glucose: Secondary | ICD-10-CM

## 2015-05-19 DIAGNOSIS — D513 Other dietary vitamin B12 deficiency anemia: Secondary | ICD-10-CM | POA: Diagnosis not present

## 2015-05-19 DIAGNOSIS — G8929 Other chronic pain: Secondary | ICD-10-CM

## 2015-05-19 DIAGNOSIS — Z789 Other specified health status: Secondary | ICD-10-CM

## 2015-05-19 DIAGNOSIS — F411 Generalized anxiety disorder: Secondary | ICD-10-CM

## 2015-05-19 DIAGNOSIS — E785 Hyperlipidemia, unspecified: Secondary | ICD-10-CM

## 2015-05-19 DIAGNOSIS — M5416 Radiculopathy, lumbar region: Secondary | ICD-10-CM

## 2015-05-19 DIAGNOSIS — M25551 Pain in right hip: Secondary | ICD-10-CM

## 2015-05-19 DIAGNOSIS — I10 Essential (primary) hypertension: Secondary | ICD-10-CM

## 2015-05-19 DIAGNOSIS — Z79899 Other long term (current) drug therapy: Secondary | ICD-10-CM | POA: Diagnosis not present

## 2015-05-19 MED ORDER — FENTANYL 25 MCG/HR TD PT72: 25.0000 ug | MEDICATED_PATCH | TRANSDERMAL | Status: DC

## 2015-05-19 MED ORDER — CITALOPRAM HYDROBROMIDE 20 MG PO TABS: 20.0000 mg | ORAL_TABLET | Freq: Every day | ORAL | Status: DC

## 2015-05-19 NOTE — Progress Notes (Signed)
Pre-visit discussion using our clinic review tool. No additional management support is needed unless otherwise documented below in the visit note.  

## 2015-05-19 NOTE — Progress Notes (Signed)
Subjective:  Patient ID: Sabrina [name], female    DOB: Oct 17, 1941  Age: 74 y.o. MRN: [id number]  CC: The primary encounter diagnosis was Low back pain without sciatica, unspecified back pain laterality. 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  HPI Sabrina [name] presents for  Multiple issues  1) 3)  Recurrent episodes of uncontrolled Hypertension.   She has noted elevated blood pressure readings at home  On July 20th BP was 200/103 .  Occurred in the setting of medication noncompliance and emotional stress (was told earlier in the day that her granddaughter had cervical cancer)   Since her last visit she has been taking her medications differently  due to side effects of sleepiness.  She states that she is taking labetalol 1/2 tablet twice daily and  1/2 amlodipine tablet bid .  She states that she is taking hydralazine and lisinopril as directed.   2) Bilateral leg pain.  She is Not walking regularly for due to leg pain that occurs in the  anterior thigh.  She has a history of severe DJD is is s/p right  hip replacement in  June 2015 by Hessie Knows, and prior left hip replacement by Maxwell Marion  in 2009 .  She reports that her chronic back pain prevents her from finishing her housework as well.   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   She declined treatment options OFFERED BY Dr. Sharlet Salina FOR LUMBOSACRAL RADICULITIS WITH NEUROGENIC CLAUDICATION INCLUDING Cymbalta, Lyrica, PT,  ESI;s and nerve blocks.  She has been receiving transdermal fentanyl  For years .  We discussed continuing  gradual reduction  In dose BUT patient  Has to avoid NSAIDs due to  CKD  and she did not tolerate gabapentin trial at lowest dose .  Need ot conisder CVD with low flow state gvien her extensive history of PAD>  .  She has had no prior PT eval for TENS UNIT. She cannot afford to see PT if it is c going to cost her any out of pocket expense.   3) "I am always dizzy."  Not reporting actual vertigo .  Has been present for several months,  since her right eye surgery .  Thinks it is due to the increased dose of amlodipine. The symptoms are aggravated by position change. Feels like she is going to fall if she closes her eyes, but has not had a fall.  No sinus pain or ear pai, no palpitations or chst pain ,  no dyspnea.    Not orthostatic on my check today.    4) "I am not anxious"  But frequently states that she feels so bad "I thought I was going to die " (made this comment at the beginning of her last two office visits with me). Uses Librium prn Anxiety.  Prior trial of  zoloft made her nauseated.  Most episodes of elevated BP readings are during episodes of feeling bad and being emotionally stressed.  Outpatient Prescriptions Prior to Visit  Medication Sig Dispense Refill  . amLODipine (NORVASC) 10 MG tablet Take 1 tablet (10 mg total) by mouth daily. 90 tablet 2  . aspirin 81 MG tablet Take 81 mg by mouth daily.    . chlordiazePOXIDE (LIBRIUM) 25 MG capsule Take 1 capsule (25 mg total) by mouth daily as needed for anxiety. 30 capsule 5  . clopidogrel (PLAVIX) 75 MG tablet Take 1 tablet (75 mg total) by mouth once. 90 tablet 2  . esomeprazole (NEXIUM) 40 MG capsule Take 1 capsule (40 mg total) by mouth daily. 90 capsule 3  . ezetimibe (ZETIA) 10 MG  tablet Take 1 tablet (10 mg total) by mouth daily. 90 tablet 2  . hydrALAZINE (APRESOLINE) 50 MG tablet Take 1 tablet (50 mg total) by mouth 3 (three) times daily as needed. 270 tablet 2  . labetalol (NORMODYNE) 200 MG tablet Take 0.5 tablets (100 mg total) by mouth 2 (two) times daily. 90 tablet 2  . lisinopril (PRINIVIL,ZESTRIL) 20 MG tablet Take 1 tablet (20 mg total) by mouth daily. 90 tablet 2  . mometasone (NASONEX) 50 MCG/ACT nasal spray Place 2 sprays into the nose daily. 17 g 12  . Multiple Vitamin (MULTIVITAMIN WITH MINERALS) TABS tablet Take 1 tablet by mouth daily.    . Omega-3 Fatty Acids (FISH OIL) 1000 MG CAPS Take 1 capsule by mouth daily.    . traMADol (ULTRAM) 50 MG tablet Take 1 tablet (50 mg total) by mouth every 6 (six) hours as needed. 90 tablet 2  . fentaNYL (DURAGESIC - DOSED MCG/HR) 50 MCG/HR Place 1 patch (50 mcg total) onto the skin every 3 (three) days. 10 patch 0  . diazepam (VALIUM) 10 MG tablet Take 1 tablet (10 mg total) by mouth daily as needed for anxiety (May Use 0.5 tablet PRN). (Patient not taking: Reported on 03/05/2015) 30 tablet 0   No facility-administered medications prior to visit.    Review of Systems;  Patient denies headache, fevers, malaise, unintentional weight loss, skin rash, eye pain, sinus congestion and sinus pain, sore throat, dysphagia,  hemoptysis , cough, dyspnea, wheezing, chest pain, palpitations, orthopnea, edema, abdominal pain, nausea, melena, diarrhea, constipation, flank pain, dysuria, hematuria, urinary  Frequency, nocturia, numbness, tingling, seizures,  Focal weakness, Loss of consciousness,  Tremor, insomnia, depression, anxiety, and suicidal ideation.      Objective:  BP 176/78 mmHg  Pulse 64  Temp(Src) 98.3 F (36.8 C) (Oral)  Resp 12  Ht 5\' 1"  (1.549 m)  Wt 155 lb 8 oz (70.534 kg)  BMI 29.40 kg/m2  SpO2 98%  BP Readings from Last 3 Encounters:  05/19/15 176/78  03/05/15 130/68  01/30/15 148/72    Wt Readings  from Last 3 Encounters:  05/19/15 155 lb 8 oz (70.534 kg)  03/05/15 155 lb 8 oz (70.534 kg)  01/30/15 155 lb (70.308 kg)    General appearance: alert, cooperative and appears stated age Ears: normal TM's and external ear canals both ears Throat: lips, mucosa, and tongue normal; teeth and gums normal Neck: no adenopathy, no carotid bruit, supple, symmetrical, trachea midline and thyroid not enlarged, symmetric, no tenderness/mass/nodules Back: symmetric, no curvature. ROM normal. No CVA tenderness. Lungs: clear to auscultation bilaterally Heart: regular rate and rhythm, S1, S2 normal, no murmur, click, rub or gallop Abdomen: soft, non-tender; bowel sounds normal; no masses,  no organomegaly Pulses: 2+ and symmetric Skin: Skin color, texture, turgor normal. No rashes or lesions Lymph nodes: Cervical, supraclavicular, and axillary  nodes normal.  No results found for: HGBA1C  Lab Results  Component Value Date   CREATININE 1.12 11/14/2014   CREATININE 1.2* 09/13/2014   CREATININE 1.2 09/04/2014    Lab Results  Component Value Date   WBC 5.5 06/21/2014   HGB 11.2* 06/21/2014   HCT 33* 06/21/2014   PLT 311 06/21/2014   GLUCOSE 125* 11/14/2014   CHOL 207* 06/24/2014   TRIG 87.0 06/24/2014   HDL 65.70 06/24/2014   LDLCALC 124* 06/24/2014   ALT 12 11/14/2014   AST 18 11/14/2014   NA 138 11/14/2014   K 4.3 11/14/2014   CL 102 11/14/2014   CREATININE 1.12 11/14/2014   BUN 16 11/14/2014   CO2 30 11/14/2014   TSH 5.74* 01/10/2014   INR 1.0 03/27/2014    No results found.  Assessment & Plan:   Problem List Items Addressed This Visit      Unprioritized   Lumbosacral radiculitis - Primary    Continue fentanyl.  Dose reduction ot 25 mcg transdermal advised given persistent symptom of dizziness.       Relevant Medications   citalopram (CELEXA) 20 MG tablet   HTN (hypertension)    uncontrolled today.  med's adjusted and anxiety addressed.       Relevant Orders    Comprehensive metabolic panel   HLD (hyperlipidemia)    Untreated due to statin intolerance. Taking zetia.  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 Will increase lisinopril if needed,  And will address anxiety with trial of citalopram.   Lab Results  Component Value Date   CREATININE 1.12 11/14/2014   Lab Results  Component Value Date   NA 138 11/14/2014   K 4.3 11/14/2014   CL 102 11/14/2014   CO2 30 11/14/2014   .        Peripheral vascular disease    Given her persistent anterior leg pain and uncontrolled HTN ,  Needs to see [facility] again for repeat renal dopplers and assessment if iliac artery circulation.       Generalized anxiety disorder    She uses it librium only once or twice per month but is clearley suffering from GAD,  She has consented to trial of citalopram       Chronic right hip pain    She is now s/o Right hip replacement,  With prior left hip replacement.  Continued pain in anterior thighs may be from mild spinal stenosis suggested by March 2016 MRi,  Vs PAD.  Vascular follow up is overdue.       Statin intolerance   Dizziness and giddiness    Persistent,  Need to consider CVD and low flow state given her extensive history of CAD.  Will consider MRA if symptoms persist.        Other Visit Diagnoses    Other dietary vitamin B12 deficiency anemia        Relevant Orders    Vitamin B12    Anemia, iron deficiency        Relevant Orders    Ferritin    Iron and TIBC    CBC with Differential/Platelet    CAD, multiple vessel  Relevant Orders    Lipid panel    Elevated blood sugar        Relevant Orders    Hemoglobin A1c      A total of 40 minutes was spent  with patient more than half of which was spent in counseling patient on the above mentioned issues , reviewing and explaining recent labs and imaging studies done, and coordination of care.  I have changed Ms. Deboard's fentaNYL. I am also having her start on citalopram. Additionally, I am having her maintain her aspirin, multivitamin with minerals, Fish Oil, diazepam, chlordiazePOXIDE, mometasone, amLODipine, clopidogrel, ezetimibe, hydrALAZINE, labetalol, lisinopril, traMADol, and esomeprazole.  Meds ordered this encounter  Medications  . fentaNYL (DURAGESIC - DOSED MCG/HR) 25 MCG/HR patch    Sig: Place 1 patch (25 mcg total) onto the skin every 3 (three) days.    Dispense:  10 patch    Refill:  0    mAY REFILL ON OR FTER jULY 30  . citalopram (CELEXA) 20 MG tablet    Sig: Take 1 tablet (20 mg total) by mouth daily.    Dispense:  30 tablet    Refill:  2    Medications Discontinued During This Encounter  Medication Reason  . fentaNYL (DURAGESIC - DOSED MCG/HR) 50 MCG/HR Reorder    Follow-up: No Follow-up on file.   Crecencio Mc, MD

## 2015-05-19 NOTE — Patient Instructions (Addendum)
I want you to resume the amlodipine as a full tablet in the evening  Take tonight's dose early ,  When you get home   You can continue 1/2 labetolol twice daily   You can increase the lisinopril to 20 mg twice daily if BP still over 160  Return for fasting labs  As soon as it is convenient   I am reducing your dose of fentanyl  FROM 50 MCG DOWN TO  25 mg daily in an effort to wean you off of narcotics since you do NOT HAVE SPINAL STENOSIS  i AM ADDING a daily medication for anxiety called CITALOPRAM .  Take it at night , starting with 1/2 tablet for the first week  You cAN still use your Librium for episodes of HIGH BLOOD PRESSURE CAUSED BY STRESS   REFERRAL TO PHYSICAL THERAPY FOR YOUR BACK

## 2015-05-20 ENCOUNTER — Encounter: Payer: Self-pay | Admitting: Internal Medicine

## 2015-05-20 DIAGNOSIS — R42 Dizziness and giddiness: Secondary | ICD-10-CM | POA: Insufficient documentation

## 2015-05-20 DIAGNOSIS — Z789 Other specified health status: Secondary | ICD-10-CM | POA: Insufficient documentation

## 2015-05-20 NOTE — Assessment & Plan Note (Signed)
She is now s/o Right hip replacement,  With prior left hip replacement.  Continued pain in anterior thighs may be from mild spinal stenosis suggested by March 2016 MRi,  Vs PAD.  Vascular follow up is overdue.

## 2015-05-20 NOTE — Assessment & Plan Note (Signed)
Given her persistent anterior leg pain and uncontrolled HTN ,  Needs to see [facility] again for repeat renal dopplers and assessment if iliac artery circulation.

## 2015-05-20 NOTE — Assessment & Plan Note (Signed)
Untreated due to statin intolerance. Taking zetia.  Repeat lipids due

## 2015-05-20 NOTE — Assessment & Plan Note (Signed)
Continue fentanyl.  Dose reduction ot 25 mcg transdermal advised given persistent symptom of dizziness.

## 2015-05-20 NOTE — Assessment & Plan Note (Signed)
She uses it librium only once or twice per month but is clearley suffering from GAD,  She has consented to trial of citalopram

## 2015-05-20 NOTE — Assessment & Plan Note (Signed)
Not Well controlled continually,  Renal function is normal and stable, she will return for fasting labs to assess renal function,  Medications changed with regard to timing of administration, no changes today. Will increase lisinopril if needed,  And will address anxiety with trial of citalopram.   Lab Results  Component Value Date   CREATININE 1.12 11/14/2014   Lab Results  Component Value Date   NA 138 11/14/2014   K 4.3 11/14/2014   CL 102 11/14/2014   CO2 30 11/14/2014   .

## 2015-05-20 NOTE — Assessment & Plan Note (Signed)
uncontrolled today.  med's adjusted and anxiety addressed.

## 2015-05-20 NOTE — Assessment & Plan Note (Signed)
Persistent,  Need to consider CVD and low flow state given her extensive history of CAD.  Will consider MRA if symptoms persist.

## 2015-05-22 ENCOUNTER — Telehealth: Payer: Self-pay | Admitting: *Deleted

## 2015-05-22 NOTE — Telephone Encounter (Signed)
Pt called states the Celexa is causing severe headaches, nausea, and pt states she feels like her throat is closing.  Further states everytime it feels like her throat is closing she drinks water and the feeling resolves.  Advised the pt not to take any more and to call 911 the next time she feels as if her throat is closing.  Please advise in Dr Lupita Dawn absence

## 2015-05-22 NOTE — Telephone Encounter (Signed)
Agree with stopping the celexa and listing it as an allergic reaction.  If her symptoms resolve with this ,  No need to be seen urgently,  But I can see her on Monday night if need be

## 2015-05-22 NOTE — Telephone Encounter (Signed)
Agree with plan. Needs to be seen in ER if symptoms of "throat closing." Stop medication. I will also copy Dr. Derrel Nip to see if she would like to see her in follow up.

## 2015-05-23 NOTE — Telephone Encounter (Signed)
Patient stated she feels much better today and feels she does not need to be seen at this time if things change she will go to ER or call office on Monday to be seen.

## 2015-05-30 ENCOUNTER — Other Ambulatory Visit: Payer: Medicare Other

## 2015-06-02 ENCOUNTER — Other Ambulatory Visit (INDEPENDENT_AMBULATORY_CARE_PROVIDER_SITE_OTHER): Payer: Medicare Other

## 2015-06-02 DIAGNOSIS — I10 Essential (primary) hypertension: Secondary | ICD-10-CM

## 2015-06-02 DIAGNOSIS — I2510 Atherosclerotic heart disease of native coronary artery without angina pectoris: Secondary | ICD-10-CM

## 2015-06-02 DIAGNOSIS — R7309 Other abnormal glucose: Secondary | ICD-10-CM

## 2015-06-02 DIAGNOSIS — D513 Other dietary vitamin B12 deficiency anemia: Secondary | ICD-10-CM

## 2015-06-02 DIAGNOSIS — D509 Iron deficiency anemia, unspecified: Secondary | ICD-10-CM | POA: Diagnosis not present

## 2015-06-02 DIAGNOSIS — R739 Hyperglycemia, unspecified: Secondary | ICD-10-CM

## 2015-06-02 LAB — IRON AND TIBC
%SAT: 36 % (ref 20–55)
Iron: 114 ug/dL (ref 42–145)
TIBC: 318 ug/dL (ref 250–470)
UIBC: 204 ug/dL (ref 125–400)

## 2015-06-02 LAB — COMPREHENSIVE METABOLIC PANEL
ALT: 13 U/L (ref 0–35)
AST: 19 U/L (ref 0–37)
Albumin: 4 g/dL (ref 3.5–5.2)
Alkaline Phosphatase: 77 U/L (ref 39–117)
BUN: 17 mg/dL (ref 6–23)
CO2: 32 mEq/L (ref 19–32)
Calcium: 9.9 mg/dL (ref 8.4–10.5)
Chloride: 102 mEq/L (ref 96–112)
Creatinine, Ser: 1.08 mg/dL (ref 0.40–1.20)
GFR: 63.9 mL/min (ref >60.00)
Glucose, Bld: 95 mg/dL (ref 70–99)
Potassium: 4.7 mEq/L (ref 3.5–5.1)
Sodium: 142 mEq/L (ref 135–145)
Total Bilirubin: 0.8 mg/dL (ref 0.2–1.2)
Total Protein: 7.1 g/dL (ref 6.0–8.3)

## 2015-06-02 LAB — CBC WITH DIFFERENTIAL/PLATELET
Basophils Absolute: 0 10*3/uL (ref 0.0–0.1)
Basophils Relative: 0.4 % (ref 0.0–3.0)
Eosinophils Absolute: 0.5 10*3/uL (ref 0.0–0.7)
Eosinophils Relative: 7.6 % — ABNORMAL HIGH (ref 0.0–5.0)
HCT: 35 % — ABNORMAL LOW (ref 36.0–46.0)
Hemoglobin: 11.7 g/dL — ABNORMAL LOW (ref 12.0–15.0)
Lymphocytes Relative: 32.4 % (ref 12.0–46.0)
Lymphs Abs: 2 10*3/uL (ref 0.7–4.0)
MCHC: 33.5 g/dL (ref 30.0–36.0)
MCV: 86.2 fl (ref 78.0–100.0)
Monocytes Absolute: 0.5 10*3/uL (ref 0.1–1.0)
Monocytes Relative: 7.8 % (ref 3.0–12.0)
Neutro Abs: 3.2 10*3/uL (ref 1.4–7.7)
Neutrophils Relative %: 51.8 % (ref 43.0–77.0)
Platelets: 262 10*3/uL (ref 150.0–400.0)
RBC: 4.06 Mil/uL (ref 3.87–5.11)
RDW: 13.6 % (ref 11.5–15.5)
WBC: 6.2 10*3/uL (ref 4.0–10.5)

## 2015-06-02 LAB — LIPID PANEL
Cholesterol: 219 mg/dL — ABNORMAL HIGH (ref 0–200)
HDL: 54.2 mg/dL (ref >39.00)
LDL Cholesterol: 148 mg/dL — ABNORMAL HIGH (ref 0–99)
NonHDL: 164.7
Total CHOL/HDL Ratio: 4
Triglycerides: 83 mg/dL (ref 0.0–149.0)
VLDL: 16.6 mg/dL (ref 0.0–40.0)

## 2015-06-02 LAB — VITAMIN B12: Vitamin B-12: 566 pg/mL (ref 211–911)

## 2015-06-02 LAB — FERRITIN: Ferritin: 39.1 ng/mL (ref 10.0–291.0)

## 2015-06-02 LAB — HEMOGLOBIN A1C: Hgb A1c MFr Bld: 5.8 % (ref 4.6–6.5)

## 2015-06-04 ENCOUNTER — Encounter: Payer: Self-pay | Admitting: Internal Medicine

## 2015-06-04 ENCOUNTER — Telehealth: Payer: Self-pay | Admitting: Internal Medicine

## 2015-06-04 NOTE — Telephone Encounter (Signed)
Pt wants to know where she is supposed to go for therapy for her back? I did not see anything scheduled. Thank You!

## 2015-06-06 ENCOUNTER — Other Ambulatory Visit: Payer: Medicare Other

## 2015-06-09 ENCOUNTER — Encounter: Payer: Self-pay | Admitting: Internal Medicine

## 2015-06-16 ENCOUNTER — Ambulatory Visit: Payer: Medicare Other | Attending: Internal Medicine

## 2015-06-16 VITALS — BP 148/56 | HR 64

## 2015-06-16 DIAGNOSIS — M545 Low back pain: Secondary | ICD-10-CM | POA: Diagnosis not present

## 2015-06-16 DIAGNOSIS — R531 Weakness: Secondary | ICD-10-CM | POA: Insufficient documentation

## 2015-06-16 DIAGNOSIS — R262 Difficulty in walking, not elsewhere classified: Secondary | ICD-10-CM | POA: Diagnosis not present

## 2015-06-16 NOTE — Therapy (Signed)
[facility] [address], [place], [address] Phone: [phone]   Fax:  [phone]  Physical Therapy Evaluation  Patient Details  Name: Sabrina [name] MRN: [id number] Date of Birth: 08-10-1941 Referring Provider:  Crecencio Mc, MD  Encounter Date: 06/16/2015      PT End of Session - 06/16/15 1536    Visit Number 1  Do Not Go Over Medicare Cap   Number of Visits 9   Date for PT Re-Evaluation 07/14/15   Authorization Type 1   Authorization Time Period of   Authorization - Visit Number 10   PT Start Time [id number]   PT Stop Time 1642   PT Time Calculation (min) 69 min      Past Medical History  Diagnosis Date  . CAD (coronary artery disease)   . Hypertension   . Hyperlipidemia   . Renal artery stenosis   . Carotid artery stenosis   . Anxiety   . 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 Subclavian arterial stenosis   . Chronic kidney disease, stage III (moderate)     Followed by Dr. Juleen China  . Anemia of chronic kidney failure   . Secondary hyperparathyroidism     Past Surgical History  Procedure Laterality Date  . Cholecystectomy    . Tonsillectomy and adenoidectomy    . Abdominal hysterectomy  1976  . Total hip arthroplasty Left   . Carotid artery angioplasty Left   . Coronary angioplasty with stent placement  2000  . Toe amputation Right     small toe  . Cystoscopy with stent placement Bilateral   . Total hip arthroplasty Right 2015  . Coronary artery bypass graft  2000  . Carotid endarterectomy Left   . Renal artery angioplasty Bilateral mARCH 2015    Filed Vitals:   06/16/15 1537  BP: 148/56  Pulse: 64    Visit Diagnosis:  Low back pain without sciatica, unspecified back pain laterality - Plan: PT plan of care cert/re-cert  Weakness - Plan: PT plan of care cert/re-cert  Difficulty walking - Plan: PT plan of care cert/re-cert  Low back pain, unspecified  back pain laterality, with sciatica presence unspecified - Plan: PT plan of care cert/re-cert      Subjective Assessment - 06/16/15 1538    Subjective Pain level: current and best 0/10 (pt currently sitting). Back pain at worst: 10/10 when pt is walking at [facility], [employer], sweeping, mopping, vacuuming, bending forward. Pt also states that her L rear-end crease hurts a lot.    Pertinent History Patient states feeling low back pain predominantly on the L side, R rear end, and front hips. Has L (2005) and R ( June 2015) THR in which pt still has the same hip pain after the surgery compared to before the surgery. The hip  surgeries terporarily helped with her pain.  Pt currently retired. 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 Back pain began at least 3 years ago. Pt does not know method of injury.  Pt also states that when pt sleeps (usually sleeps on R side), her L foot goes numb which takes 15 minutes to go back to normal.  Also feels a cramp L hamstrings (once every 3-4 months).  Denies bowel or bladder problems.    Patient Stated Goals Pt expresses desire for her  back to stop hurting.    Currently in Pain? No/denies   Aggravating Factors  Prolonged walking, mopping, sweeping, vacuuming, bending forward   Multiple Pain Sites Yes  low back, R and L hip area            [facility] PT Assessment - 06/16/15 1559    Assessment   Medical Diagnosis low back pain without sciatica   Onset Date/Surgical Date 05/19/15   Prior Therapy No known PT for current condition   Precautions   Precaution Comments no known precautions   Restrictions   Other Position/Activity Restrictions no known restrictions   Balance Screen   Has the patient fallen in the past 6 months No   Has the patient had a decrease in activity level because of a fear of falling?  No   Is the patient reluctant to leave their home because of a fear of  falling?  No   Prior Function   Vocation Retired   Biomedical scientist PLOF: better able to walk for longer periods, perform chores such as mopping and vacuuming   Observation/Other Assessments   Modified [place] 40% (severe disability)   Posture/Postural Control   Posture Comments R weight shift, forward flexed, decreased bilateral hip extension, bilaterally protracted shoulders, protracted neck. Slight R low back side bend to around L3, L low back and trunk side bend starting at around L3. L pelvis rotation, R trunk rotation   AROM   Overall AROM Comments L hip extension PROM -6 degrees with reproduction of low back and tail bone pain. R hip extension AROM -15 degrees   Lumbar Flexion WFL but with low back pain. Aberrant movement on the return motion using L UE to assit.    Lumbar Extension limited with more back pain compared to forward flexion   Lumbar - Right Side Bend limited with L low back pain (around L5 transverse process)   Lumbar - Left Side Bend more limited than R with L low back pain (around L5 TP)   Lumbar - Right Rotation limited with L low back pain around L2/L3 (movement crease).    Lumbar - Left Rotation limited   Strength   Right Hip Extension 3-/5  glute max   Right Hip ABduction 4-/5   Left Hip Extension 3-/5  with R groin pain; measured for glute max strength   Left Hip ABduction 4-/5   Palpation   Palpation comment TTP L sacrum   Ambulation/Gait   Gait Comments Antalgic with decreased stance on R side, increased L posterior pelvic rotation. decreased stride length.             [facility] Adult PT Treatment/Exercise - 06/16/15 1559    Exercises   Other Exercises  Directed patient with supine bilateral hip flexor stretch 30 seconds x3. Reviewed and given as part of her HEP. Pt demonstrated and verbalized understanding. Please see pt instructions.            PT Education - 06/16/15 2229    Education provided Yes   Education Details ther-ex   [employer])  Educated Patient   Methods Explanation;Demonstration;Tactile cues;Verbal cues   Comprehension Verbalized understanding;Returned demonstration;Verbal cues required             PT Long Term Goals - 06/16/15 2219    PT LONG TERM GOAL #1   Title Patient will be independent with her HEP to promote bilateral hip extension, glute strength, and help decrease back and LE pain.    Time 4  Period Weeks   Status New   PT LONG TERM GOAL #2   Title Patient will have a decrease in back pain to 6/10 or less at worst to promote ability to ambulate, and perform chores at home.   Time 4   Period Weeks   Status New   PT LONG TERM GOAL #3   Title Patient will improve her Modified Oswestry Low Back Pain disability questionnaire by at least 6 points as a demonstration of improved function.   Time 4   Period Weeks   Status New   PT LONG TERM GOAL #4   Title Patient will improve bilateral hip strength by at least 1/2 MMT grade to promote ability to perform standing tasks with less back pain.   Time 4   Period Weeks   Status New            Plan - 2015/07/15 02/14/2212    Clinical Impression Statement Patient is a 74 year old female who came to physical therapy secondary to low back pain with L LE paresthesia. She also presents with altered gait pattern and posture, bilateral hip weakness, TTP (especially to sacrum on L side), and reproduction of symptoms with lumbar active movements, and difficulty performing functional tasks such as walking for long periods, and performing chores such as mopping, and vacuuming. Patient will benefit from skilled physical therapy services to address the aforementioned deficits.    Pt will benefit from skilled therapeutic intervention in order to improve on the following deficits Abnormal gait;Decreased range of motion;Difficulty walking;Pain;Improper body mechanics;Decreased strength   Rehab Potential Good   Clinical Impairments Affecting Rehab Potential chronicity of  condition   PT Frequency 2x / week   PT Duration 4 weeks   PT Treatment/Interventions Manual techniques;Therapeutic exercise;Therapeutic activities;Moist Heat;Iontophoresis 4mg /ml Dexamethasone;Electrical Stimulation;Cryotherapy;Patient/family education;ADLs/Self Care Home Management;Ultrasound;Neuromuscular re-education;Traction   PT Next Visit Plan check long sit test; manual therapy to low back, thoracic spine; improve bilateral hip flexor flexibility, glute med and max strength   Consulted and Agree with Plan of Care Patient          G-Codes - July 15, 2015 02-14-2224    Functional Assessment Tool Used Modified Oswestry Low Back Pain disability questionnaire, pt interview   Functional Limitation Mobility: Walking and moving around   Mobility: Walking and Moving Around Current Status [phone]) At least 40 percent but less than 60 percent impaired, limited or restricted   Mobility: Walking and Moving Around Goal Status [phone]) At least 1 percent but less than 20 percent impaired, limited or restricted       Problem List Patient Active Problem List   Diagnosis Date Noted  . Statin intolerance 05/20/2015  . Dizziness and giddiness 05/20/2015  . GERD (gastroesophageal reflux disease) 12/06/2014  . Abdominal pain, epigastric 11/14/2014  . Chronic right hip pain 11/14/2014  . Generalized anxiety disorder 09/08/2014  . 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 Peripheral vascular disease 08/03/2014  . CAD (coronary artery disease) 08/03/2014  . HLD (hyperlipidemia) 06/24/2014  . Chronic narcotic use 06/24/2014  . Need for prophylactic vaccination and inoculation against influenza 06/24/2014  . Need for vaccination with 13-polyvalent pneumococcal conjugate vaccine 12/28/2013  . Aortic systolic murmur on examination 12/28/2013  . Hip pain, bilateral 12/27/2013  . Lumbosacral radiculitis 12/27/2013  . HTN (hypertension) 12/27/2013     Joneen Boers PT, DPT  07/15/2015, 10:37 PM  [facility] [address] [place], [address] Phone: [phone]   Fax:  [phone]

## 2015-06-16 NOTE — Patient Instructions (Addendum)
One your back, with your legs straight, squeeze rear end muscles together so that you feel a stretch in your front thigh. Hold for 30 seconds. Repeat 3x.   Improved exercise technique, movement at target joints, use of target muscles after mod verbal, visual, tactile cues.

## 2015-06-18 ENCOUNTER — Ambulatory Visit: Payer: Medicare Other

## 2015-06-23 DIAGNOSIS — M1990 Unspecified osteoarthritis, unspecified site: Secondary | ICD-10-CM | POA: Diagnosis not present

## 2015-06-23 DIAGNOSIS — G459 Transient cerebral ischemic attack, unspecified: Secondary | ICD-10-CM | POA: Diagnosis not present

## 2015-06-23 DIAGNOSIS — E785 Hyperlipidemia, unspecified: Secondary | ICD-10-CM | POA: Diagnosis not present

## 2015-06-23 DIAGNOSIS — I701 Atherosclerosis of renal artery: Secondary | ICD-10-CM | POA: Diagnosis not present

## 2015-06-23 DIAGNOSIS — I6529 Occlusion and stenosis of unspecified carotid artery: Secondary | ICD-10-CM | POA: Diagnosis not present

## 2015-06-23 DIAGNOSIS — E669 Obesity, unspecified: Secondary | ICD-10-CM | POA: Diagnosis not present

## 2015-06-23 DIAGNOSIS — I129 Hypertensive chronic kidney disease with stage 1 through stage 4 chronic kidney disease, or unspecified chronic kidney disease: Secondary | ICD-10-CM | POA: Diagnosis not present

## 2015-06-23 DIAGNOSIS — I70213 Atherosclerosis of native arteries of extremities with intermittent claudication, bilateral legs: Secondary | ICD-10-CM | POA: Diagnosis not present

## 2015-06-23 DIAGNOSIS — M79609 Pain in unspecified limb: Secondary | ICD-10-CM | POA: Diagnosis not present

## 2015-06-25 ENCOUNTER — Ambulatory Visit: Payer: Medicare Other | Admitting: Physical Therapy

## 2015-06-25 DIAGNOSIS — R531 Weakness: Secondary | ICD-10-CM | POA: Diagnosis not present

## 2015-06-25 DIAGNOSIS — M545 Low back pain: Secondary | ICD-10-CM | POA: Diagnosis not present

## 2015-06-25 DIAGNOSIS — R262 Difficulty in walking, not elsewhere classified: Secondary | ICD-10-CM

## 2015-06-26 NOTE — Therapy (Signed)
[facility] [address] [place], [address] Phone: [phone]   Fax:  [phone]  Physical Therapy Treatment  Patient Details  Name: Sabrina [name] MRN: [id number] Date of Birth: 11-16-1940 Referring Provider:  Crecencio Mc, MD  Encounter Date: 06/25/2015      PT End of Session - 06/26/15 0937    Visit Number 2   Number of Visits 9   Date for PT Re-Evaluation 07/14/15   Authorization Type 2   PT Start Time 1530   PT Stop Time 1615   PT Time Calculation (min) 45 min   Activity Tolerance Patient tolerated treatment well   Behavior During Therapy [facility] for tasks assessed/performed      Past Medical History  Diagnosis Date  . CAD (coronary artery disease)   . Hypertension   . Hyperlipidemia   . Renal artery stenosis   . Carotid artery stenosis   . Anxiety   . 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 Subclavian arterial stenosis   . Chronic kidney disease, stage III (moderate)     Followed by Dr. Juleen China  . Anemia of chronic kidney failure   . Secondary hyperparathyroidism     Past Surgical History  Procedure Laterality Date  . Cholecystectomy    . Tonsillectomy and adenoidectomy    . Abdominal hysterectomy  1976  . Total hip arthroplasty Left   . Carotid artery angioplasty Left   . Coronary angioplasty with stent placement  2000  . Toe amputation Right     small toe  . Cystoscopy with stent placement Bilateral   . Total hip arthroplasty Right 2015  . Coronary artery bypass graft  2000  . Carotid endarterectomy Left   . Renal artery angioplasty Bilateral mARCH 2015    There were no vitals filed for this visit.  Visit Diagnosis:  Low back pain without sciatica, unspecified back pain laterality  Weakness  Difficulty walking      Subjective Assessment - 06/26/15 0936    Subjective Patient reports no pain at rest, but finds consistent excrutiating pain still  with short distance ambulation/weight bearing in her groin area bilaterally and her lumbar spine region.    Pertinent History Patient states feeling low back pain predominantly on the L side, R rear end, and front hips. Has L (2005) and R ( June 2015) THR in which pt still has the same hip pain after the surgery compared to before the surgery. The hip  surgeries terporarily helped with her pain.  Pt currently retired. 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 Back pain began at least 3 years ago. Pt does not know method of injury.  Pt also states that when pt sleeps (usually sleeps on R side), her L foot goes numb which takes 15 minutes to go back to normal.  Also feels a cramp L hamstrings (once every 3-4 months).  Denies bowel or bladder problems.    Patient Stated Goals Pt expresses desire for her back to stop hurting.    Currently in Pain? --  None at rest, with 3 laps in clinic she has to sit down due to increased LBP.       Manual Techniques  Light pressure applied to painful area on LLE in supine, no relief of symptoms x 2 minutes  Long axis distraction bilaterally  x 1-2 minutes grade 1, no change in symptoms  Manual hip abduction with mobilization to stretch adductors in supine with sensation of stretch and mild reduction in symptoms    TherEx  Standing hip flexor stretch to wall, unable to complete properly with significant cuing after several minutes  Standing side lunge for adductor stretch, again unable to complete properly without significant cuing, mild decrease in symptoms.  Standing hip extension/quadricep stretching with manual over-pressure bilaterally 3 rounds x 10 repetitions with 15-30 second holds -- significantly decreased symptoms, patient ambulated 3 laps on 3 separate occassions during this session to re-evaluate efficacy of treatments. She experienced no symptoms after 3rd bout of  quadricep/hip flexor stretching in either hip or lumbar spine.                          PT Education - 06/26/15 [phone]    Education provided Yes   Education Details HEP and likely source of discomfort (hip flexor restriction).   Person(s) Educated Patient   Methods Explanation;Demonstration;Handout;Verbal cues;Tactile cues   Comprehension Returned demonstration;Verbalized understanding             PT Long Term Goals - 06/16/15 2219    PT LONG TERM GOAL #1   Title Patient will be independent with her HEP to promote bilateral hip extension, glute strength, and help decrease back and LE pain.    Time 4   Period Weeks   Status New   PT LONG TERM GOAL #2   Title Patient will have a decrease in back pain to 6/10 or less at worst to promote ability to ambulate, and perform chores at home.   Time 4   Period Weeks   Status New   PT LONG TERM GOAL #3   Title Patient will improve her Modified Oswestry Low Back Pain disability questionnaire by at least 6 points as a demonstration of improved function.   Time 4   Period Weeks   Status New   PT LONG TERM GOAL #4   Title Patient will improve bilateral hip strength by at least 1/2 MMT grade to promote ability to perform standing tasks with less back pain.   Time 4   Period Weeks   Status New               Plan - 06/26/15 [id number]    Clinical Impression Statement Patient displays minimal hip extension in gait and expresses pain in her groin/hip flexor region. Patient has difficulty tolerating supine and struggles to correctly stretch her adductors/hip flexors independently in standing. Patient seems to find relief with manual assistance with hip flexor/quad stretching, as she was able to ambulate 3 laps at the end of the session with no increase in hip pain bilaterally or her lumbar spine. Patient educated to begin stretching program as tolerated throughout the day.    Pt will benefit from skilled therapeutic  intervention in order to improve on the following deficits Abnormal gait;Decreased range of motion;Difficulty walking;Pain;Improper body mechanics;Decreased strength   Rehab Potential Good   Clinical Impairments Affecting Rehab Potential chronicity of condition   PT Frequency 2x / week   PT Duration 4 weeks   PT Treatment/Interventions Manual techniques;Therapeutic exercise;Therapeutic activities;Moist Heat;Iontophoresis 4mg /ml Dexamethasone;Electrical Stimulation;Cryotherapy;Patient/family education;ADLs/Self Care Home Management;Ultrasound;Neuromuscular re-education;Traction   PT Next Visit Plan Progress hip flexor stretching, manual therapy as needed. Long sit test if desired/indicated. Glute strengthening as tolerated.    PT Home Exercise Plan See patient instructions.  Consulted and Agree with Plan of Care Patient        Problem List Patient Active Problem List   Diagnosis Date Noted  . Statin intolerance 05/20/2015  . Dizziness and giddiness 05/20/2015  . GERD (gastroesophageal reflux disease) 12/06/2014  . Abdominal pain, epigastric 11/14/2014  . Chronic right hip pain 11/14/2014  . Generalized anxiety disorder 09/08/2014  . 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 Peripheral vascular disease 08/03/2014  . CAD (coronary artery disease) 08/03/2014  . HLD (hyperlipidemia) 06/24/2014  . Chronic narcotic use 06/24/2014  . Need for prophylactic vaccination and inoculation against influenza 06/24/2014  . Need for vaccination with 13-polyvalent pneumococcal conjugate vaccine 12/28/2013  . Aortic systolic murmur on examination 12/28/2013  . Hip pain, bilateral 12/27/2013  . Lumbosacral radiculitis 12/27/2013  . HTN (hypertension) 12/27/2013   Kerman Passey, PT, DPT    06/26/2015, 10:33 AM  [facility] [address] [place],  [address] Phone: [phone]   Fax:  [phone]

## 2015-06-26 NOTE — Patient Instructions (Signed)
All exercises provided were adapted from [email or web address]. Patient was provided a written handout with pictures as described. Any additional cues were manually entered in to handout and copied in to this document.  QUADRICEPS STRETCH  Pull heel toward buttock until stretch is felt in front of thigh.  Hold ___ seconds; relax;     **Use a belt around your ankle to help hold this. Or a towel or bed sheet.

## 2015-07-01 ENCOUNTER — Ambulatory Visit: Payer: Medicare Other | Admitting: Internal Medicine

## 2015-07-02 ENCOUNTER — Ambulatory Visit: Payer: Medicare Other | Attending: Internal Medicine

## 2015-07-02 DIAGNOSIS — R262 Difficulty in walking, not elsewhere classified: Secondary | ICD-10-CM | POA: Diagnosis not present

## 2015-07-02 DIAGNOSIS — M545 Low back pain: Secondary | ICD-10-CM | POA: Diagnosis not present

## 2015-07-02 DIAGNOSIS — R531 Weakness: Secondary | ICD-10-CM | POA: Diagnosis not present

## 2015-07-02 NOTE — Patient Instructions (Addendum)
(  Home) Extension: Thoracic With Lumbar Lock - Sitting   Sit with back against chair, knees bent, hands locked behind head. Breathe in, extending head and trunk over chair back. Breathe out. Hold position for __5__ seconds. Repeat _10___ times per set. Do _2___ sets per session daily.   Copyright  VHI. All rights reserved.  Mini Squat   Holding onto something sturdy for safety, place feet in line with hips. Look straight ahead, back straight. Bend knees slightly as you push your rear-end back.  Then squeeze rear-end muscles and stand up straight to feel a stretch in front of your hips.     Repeat ___10_ times. Do ___2_ sets. CAUTION: Move slowly. Wear supportive shoes as needed.  Copyright  VHI. All rights reserved.    Improved exercise technique, movement at target joints, use of target muscles after mod verbal, visual, tactile cues.

## 2015-07-02 NOTE — Therapy (Signed)
[facility] [address] [place], [address] Phone: [phone]   Fax:  [phone]  Physical Therapy Treatment  Patient Details  Name: Sabrina [name] MRN: [id number] Date of Birth: [birth year] Referring Provider:  Crecencio Mc, MD  Encounter Date: 07/02/2015      PT End of Session - 07/02/15 1430    Visit Number 3   Number of Visits 9   Date for PT Re-Evaluation 07/14/15   Authorization Type 3   Authorization Time Period of 10   PT Start Time 1430   PT Stop Time 1535   PT Time Calculation (min) 65 min   Activity Tolerance Patient tolerated treatment well   Behavior During Therapy [facility] for tasks assessed/performed      Past Medical History  Diagnosis Date  . CAD (coronary artery disease)   . Hypertension   . Hyperlipidemia   . Renal artery stenosis   . Carotid artery stenosis   . Anxiety   . 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 Subclavian arterial stenosis   . Chronic kidney disease, stage III (moderate)     Followed by Dr. Juleen China  . Anemia of chronic kidney failure   . Secondary hyperparathyroidism     Past Surgical History  Procedure Laterality Date  . Cholecystectomy    . Tonsillectomy and adenoidectomy    . Abdominal hysterectomy  1976  . Total hip arthroplasty Left   . Carotid artery angioplasty Left   . Coronary angioplasty with stent placement  2000  . Toe amputation Right     small toe  . Cystoscopy with stent placement Bilateral   . Total hip arthroplasty Right 2015  . Coronary artery bypass graft  2000  . Carotid endarterectomy Left   . Renal artery angioplasty Bilateral mARCH 2015    There were no vitals filed for this visit.  Visit Diagnosis:  Low back pain without sciatica, unspecified back pain laterality  Weakness  Difficulty walking  Low back pain, unspecified back pain laterality, with sciatica presence unspecified      Subjective  Assessment - 07/02/15 1431    Subjective 6/10 back pain currently. Pain when standing. Pain decreases when sitting.    Patient Stated Goals Pt expresses desire for her back to stop hurting.    Currently in Pain? Yes   Pain Score 6    Pain Descriptors / Indicators Aching;Constant   Pain Type Chronic pain   Multiple Pain Sites Yes  low back, R and L hip area.           [facility] PT Assessment - 07/02/15 1437    Observation/Other Assessments   Observations Long sit test suggests posterior nutation of L innominate           [facility] Adult PT Treatment/Exercise - 07/02/15 1444    Exercises   Other Exercises  (+) Long sit test suggests posterior nutation of L innominate. Directed patient with seated manually resisted L hip flexion 10x5 seconds for 2 sets, seated manually resisted R hip extension 10x5 seconds for 2 sets. Pt education on hip flexor tightness and back pain. Directed patient with standing static mini lunge to promote hip flexor stretching 10x each LE, then 2x5 each LE, standing mini squats with emphasis on glute max use and hip extension in standing position 10x2, standing bilateral scapular retraction yellow band 10x5 seconds, seated thoracic extension 10x2 with 5 second holds. Reviewed HEP (please see  pt instructions).    Manual Therapy   Manual therapy comments L S/L P to A to R sacral ala grade 3, seated P to A to R sacral ala grade 3  Decreased TTP to R low back after manual in sidelying.            PT Long Term Goals - 06/16/15 2219    PT LONG TERM GOAL #1   Title Patient will be independent with her HEP to promote bilateral hip extension, glute strength, and help decrease back and LE pain.    Time 4   Period Weeks   Status New   PT LONG TERM GOAL #2   Title Patient will have a decrease in back pain to 6/10 or less at worst to promote ability to ambulate, and perform chores at home.   Time 4   Period Weeks   Status New   PT LONG TERM GOAL #3   Title Patient will  improve her Modified Oswestry Low Back Pain disability questionnaire by at least 6 points as a demonstration of improved function.   Time 4   Period Weeks   Status New   PT LONG TERM GOAL #4   Title Patient will improve bilateral hip strength by at least 1/2 MMT grade to promote ability to perform standing tasks with less back pain.   Time 4   Period Weeks   Status New          Plan - 07/02/15 1546    Clinical Impression Statement No back pain with walking approximately 48 ft after manually reasisted seated L hip flexion R hip extension exercises. Pt however felt L hip discomfort with gait stating that it feels like its going to fall apart with walking. TTP R sacral ala which decreased after manual therapy in S/L. Pt demonstrates bilateral hip tightness which may play a factor on low back pain and L hip discomfort.    Pt will benefit from skilled therapeutic intervention in order to improve on the following deficits Abnormal gait;Decreased range of motion;Difficulty walking;Pain;Improper body mechanics;Decreased strength   Rehab Potential Good   Clinical Impairments Affecting Rehab Potential chronicity of condition   PT Frequency 2x / week   PT Duration 4 weeks   PT Treatment/Interventions Manual techniques;Therapeutic exercise;Therapeutic activities;Moist Heat;Iontophoresis 4mg /ml Dexamethasone;Electrical Stimulation;Cryotherapy;Patient/family education;ADLs/Self Care Home Management;Ultrasound;Neuromuscular re-education;Traction   PT Next Visit Plan Progress hip flexor stretching, manual therapy as needed. Long sit test if desired/indicated. Glute strengthening as tolerated.    Consulted and Agree with Plan of Care Patient        Problem List Patient Active Problem List   Diagnosis Date Noted  . Statin intolerance 05/20/2015  . Dizziness and giddiness 05/20/2015  . GERD (gastroesophageal reflux disease) 12/06/2014  . Abdominal pain, epigastric 11/14/2014  . Chronic right hip  pain 11/14/2014  . Generalized anxiety disorder 09/08/2014  . 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 Peripheral vascular disease 08/03/2014  . CAD (coronary artery disease) 08/03/2014  . HLD (hyperlipidemia) 06/24/2014  . Chronic narcotic use 06/24/2014  . Need for prophylactic vaccination and inoculation against influenza 06/24/2014  . Need for vaccination with 13-polyvalent pneumococcal conjugate vaccine 12/28/2013  . Aortic systolic murmur on examination 12/28/2013  . Hip pain, bilateral 12/27/2013  . Lumbosacral radiculitis 12/27/2013  . HTN (hypertension) 12/27/2013   Joneen Boers PT, DPT   07/02/2015, 4:00 PM  [facility] [address], [place], [address] Phone: [phone]   Fax:  [phone]

## 2015-07-18 ENCOUNTER — Ambulatory Visit (INDEPENDENT_AMBULATORY_CARE_PROVIDER_SITE_OTHER): Payer: Medicare Other | Admitting: Internal Medicine

## 2015-07-18 VITALS — BP 160/72 | HR 60 | Temp 98.2°F | Resp 14 | Ht 61.0 in | Wt 160.0 lb

## 2015-07-18 DIAGNOSIS — R51 Headache: Secondary | ICD-10-CM | POA: Diagnosis not present

## 2015-07-18 DIAGNOSIS — F411 Generalized anxiety disorder: Secondary | ICD-10-CM

## 2015-07-18 DIAGNOSIS — R519 Headache, unspecified: Secondary | ICD-10-CM

## 2015-07-18 DIAGNOSIS — I739 Peripheral vascular disease, unspecified: Secondary | ICD-10-CM

## 2015-07-18 DIAGNOSIS — M542 Cervicalgia: Secondary | ICD-10-CM | POA: Diagnosis not present

## 2015-07-18 DIAGNOSIS — I2510 Atherosclerotic heart disease of native coronary artery without angina pectoris: Secondary | ICD-10-CM

## 2015-07-18 MED ORDER — BACLOFEN 10 MG PO TABS: 10.0000 mg | ORAL_TABLET | Freq: Three times a day (TID) | ORAL | Status: DC

## 2015-07-18 MED ORDER — FENTANYL 25 MCG/HR TD PT72: 25.0000 ug | MEDICATED_PATCH | TRANSDERMAL | Status: DC

## 2015-07-18 NOTE — Progress Notes (Signed)
Subjective:  Patient ID: Sabrina [name], female    DOB: 11-01-1940  Age: 74 y.o. MRN: [id number]  CC: The primary encounter diagnosis was Cervicalgia. Diagnoses of Recurrent occipital headache, Generalized anxiety disorder, and Peripheral vascular disease were also pertinent to this visit.  HPI Sabrina [name] presents for evaluation of persistent headaches.  Has had a daily headache for three weeks.  .  Can be throbbing,  Sharp or an ache, Top of head temples  and down the occipital area,  No vision changes    Was treated in the past by [facility] with tramadol  .  Tried it last night and it helped,  2) Anxiety: Citalopram started at last visit in July;  She states that she stopped it after less than a week. ,  She can't remember why she stopped it but it "made her sick".    Chronic back pain :  At last visit her  Fentanyl  Dose was reduced given her report of recurrent dizziness.  Her dizziness as resolved and she has had PT for low back pain ongoing   Vascular surgery referral  was also made  in July (no records yet) for evaluation of bilateral thigh pain and headaches in the setting of severe PAD .  Doppler of abdominal aorta was done and reportedly  fine   Outpatient Prescriptions Prior to Visit  Medication Sig Dispense Refill  . amLODipine (NORVASC) 10 MG tablet Take 1 tablet (10 mg total) by mouth daily. 90 tablet 2  . aspirin 81 MG tablet Take 81 mg by mouth daily.    . clopidogrel (PLAVIX) 75 MG tablet Take 1 tablet (75 mg total) by mouth once. 90 tablet 2  . esomeprazole (NEXIUM) 40 MG capsule Take 1 capsule (40 mg total) by mouth daily. 90 capsule 3  . ezetimibe (ZETIA) 10 MG tablet Take 1 tablet (10 mg total) by mouth daily. 90 tablet 2  . hydrALAZINE (APRESOLINE) 50 MG tablet Take 1 tablet (50 mg total) by mouth 3 (three) times daily as needed. 270 tablet 2  . labetalol (NORMODYNE) 200 MG tablet Take 0.5 tablets (100 mg total) by mouth 2 (two) times daily. 90 tablet 2  .  lisinopril (PRINIVIL,ZESTRIL) 20 MG tablet Take 1 tablet (20 mg total) by mouth daily. 90 tablet 2  . Multiple Vitamin (MULTIVITAMIN WITH MINERALS) TABS tablet Take 1 tablet by mouth daily.    . Omega-3 Fatty Acids (FISH OIL) 1000 MG CAPS Take 1 capsule by mouth daily.    . traMADol (ULTRAM) 50 MG tablet Take 1 tablet (50 mg total) by mouth every 6 (six) hours as needed. 90 tablet 2  . fentaNYL (DURAGESIC - DOSED MCG/HR) 25 MCG/HR patch Place 1 patch (25 mcg total) onto the skin every 3 (three) days. 10 patch 0  . chlordiazePOXIDE (LIBRIUM) 25 MG capsule Take 1 capsule (25 mg total) by mouth daily as needed for anxiety. (Patient not taking: Reported on 07/18/2015) 30 capsule 5  . citalopram (CELEXA) 20 MG tablet Take 1 tablet (20 mg total) by mouth daily. (Patient not taking: Reported on 06/16/2015) 30 tablet 2  . diazepam (VALIUM) 10 MG tablet Take 1 tablet (10 mg total) by mouth daily as needed for anxiety (May Use 0.5 tablet PRN). (Patient not taking: Reported on 03/05/2015) 30 tablet 0  . mometasone (NASONEX) 50 MCG/ACT nasal spray Place 2 sprays into the nose daily. (Patient not taking: Reported on 06/16/2015) 17 g 12   No facility-administered medications prior  to visit.    Review of Systems;  Patient denies headache, fevers, malaise, unintentional weight loss, skin rash, eye pain, sinus congestion and sinus pain, sore throat, dysphagia,  hemoptysis , cough, dyspnea, wheezing, chest pain, palpitations, orthopnea, edema, abdominal pain, nausea, melena, diarrhea, constipation, flank pain, dysuria, hematuria, urinary  Frequency, nocturia, numbness, tingling, seizures,  Focal weakness, Loss of consciousness,  Tremor, insomnia, depression, anxiety, and suicidal ideation.      Objective:  BP 160/72 mmHg  Pulse 60  Temp(Src) 98.2 F (36.8 C) (Oral)  Resp 14  Ht 5\' 1"  (1.549 m)  Wt 160 lb (72.576 kg)  BMI 30.25 kg/m2  SpO2 99%  BP Readings from Last 3 Encounters:  07/18/15 160/72  06/16/15  148/56  05/19/15 176/78    Wt Readings from Last 3 Encounters:  07/18/15 160 lb (72.576 kg)  05/19/15 155 lb 8 oz (70.534 kg)  03/05/15 155 lb 8 oz (70.534 kg)    General appearance: alert, cooperative and appears stated age Ears: normal TM's and external ear canals both ears Throat: lips, mucosa, and tongue normal; teeth and gums normal Neck: no adenopathy, no carotid bruit, supple, symmetrical, trachea midline and thyroid not enlarged, symmetric, no tenderness/mass/nodules Back: symmetric, no curvature. ROM normal. No CVA tenderness. Lungs: clear to auscultation bilaterally Heart: regular rate and rhythm, S1, S2 normal, no murmur, click, rub or gallop Pulses: 2+ and symmetric Skin: Skin color, texture, turgor normal. No rashes or lesions Neuro: CNs 2-12 intact. DTRs 2+/4 in biceps, brachioradialis, patellars and achilles. Muscle strength 5/5 in upper and lower exremities. Fine resting tremor bilaterally both hands cerebellar function normal. Romberg negative.  No pronator drift.   Gait normal.   Lab Results  Component Value Date   HGBA1C 5.8 06/02/2015    Lab Results  Component Value Date   CREATININE 1.08 06/02/2015   CREATININE 1.12 11/14/2014   CREATININE 1.2* 09/13/2014    Lab Results  Component Value Date   WBC 6.2 06/02/2015   HGB 11.7* 06/02/2015   HCT 35.0* 06/02/2015   PLT 262.0 06/02/2015   GLUCOSE 95 06/02/2015   CHOL 219* 06/02/2015   TRIG 83.0 06/02/2015   HDL 54.20 06/02/2015   LDLCALC 148* 06/02/2015   ALT 13 06/02/2015   AST 19 06/02/2015   NA 142 06/02/2015   K 4.7 06/02/2015   CL 102 06/02/2015   CREATININE 1.08 06/02/2015   BUN 17 06/02/2015   CO2 32 06/02/2015   TSH 5.74* 01/10/2014   INR 1.0 03/27/2014   HGBA1C 5.8 06/02/2015    No results found.  Assessment & Plan:   Problem List Items Addressed This Visit    Peripheral vascular disease    She was referred to Dr Lucky Cowboy for renal and iliac artery assessmenet in July.  Still waiting  for the results       Generalized anxiety disorder    She did not give citalopram a chance.  She is not interested in SSRI therapy.  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  She has no temple tenderness and no vision changes to suggest vasculitis.  Adding muscle relaxer,  Avoid NSAIDs due to CKD . Plain films ordered.       Relevant Medications   baclofen (LIORESAL) 10 MG tablet   fentaNYL (DURAGESIC - DOSED  MCG/HR) 25 MCG/HR patch   Other Relevant Orders   DG Cervical Spine Complete    Other Visit Diagnoses    Cervicalgia    -  Primary    Relevant Orders    DG Cervical Spine Complete       I have discontinued Ms. Manas's diazepam, mometasone, and citalopram. I am also having her start on baclofen. Additionally, I am having her maintain her aspirin, multivitamin with minerals, Fish Oil, chlordiazePOXIDE, amLODipine, clopidogrel, ezetimibe, hydrALAZINE, labetalol, lisinopril, traMADol, esomeprazole, and fentaNYL.  Meds ordered this encounter  Medications  . baclofen (LIORESAL) 10 MG tablet    Sig: Take 1 tablet (10 mg total) by mouth 3 (three) times daily. As needed for muscle spasm    Dispense:  90 each    Refill:  0  . fentaNYL (DURAGESIC - DOSED MCG/HR) 25 MCG/HR patch    Sig: Place 1 patch (25 mcg total) onto the skin every 3 (three) days.    Dispense:  10 patch    Refill:  0    Medications Discontinued During This Encounter  Medication Reason  . citalopram (CELEXA) 20 MG tablet Patient Preference  . diazepam (VALIUM) 10 MG tablet Patient Preference  . mometasone (NASONEX) 50 MCG/ACT nasal spray Patient Preference  . fentaNYL (DURAGESIC - DOSED MCG/HR) 25 MCG/HR patch Reorder  . mometasone (NASONEX) 50 MCG/ACT nasal spray Patient Preference  . citalopram (CELEXA) 20 MG tablet Patient  Preference  . diazepam (VALIUM) 10 MG tablet Patient Preference    Follow-up: No Follow-up on file.   Crecencio Mc, MD

## 2015-07-18 NOTE — Patient Instructions (Signed)
I think your headaches may be coming from arthritis in your neck  I AM ORDERING PLAIN X RAYS TO INVESTIGATE  You can continue to use tramadol as needed (up to 3 daily) but avoid Alevel and Motrin  I am adding a muscle relaxer because your neck and shoulder muscles are in spasm on the left  Do not increase your fentanyl above 25 mcg every 3 days because it made you dizzy

## 2015-07-18 NOTE — Progress Notes (Signed)
Pre visit review using our clinic review tool, if applicable. No additional management support is needed unless otherwise documented below in the visit note. 

## 2015-07-20 ENCOUNTER — Encounter: Payer: Self-pay | Admitting: Internal Medicine

## 2015-07-20 DIAGNOSIS — R519 Headache, unspecified: Secondary | ICD-10-CM | POA: Insufficient documentation

## 2015-07-20 DIAGNOSIS — R51 Headache: Secondary | ICD-10-CM

## 2015-07-20 NOTE — Assessment & Plan Note (Addendum)
She did not give citalopram a chance.  She is not interested in SSRI therapy.  Given her age and the risk of overdose,  I will not refill benzodiazepine unless she discontinues Fentanyl

## 2015-07-20 NOTE — Assessment & Plan Note (Signed)
She was referred to Dr Lucky Cowboy for renal and iliac artery assessmenet in July.  Still waiting for the results

## 2015-07-20 NOTE — Assessment & Plan Note (Signed)
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  She has no temple tenderness and no vision changes to suggest vasculitis.  Adding muscle relaxer,  Avoid NSAIDs due to CKD . Plain films ordered.

## 2015-07-22 ENCOUNTER — Telehealth: Payer: Self-pay | Admitting: Internal Medicine

## 2015-07-22 ENCOUNTER — Ambulatory Visit
Admission: RE | Admit: 2015-07-22 | Discharge: 2015-07-22 | Disposition: A | Discharge status: Home / Self Care | Payer: Medicare Other | Source: Ambulatory Visit | Attending: Internal Medicine | Admitting: Internal Medicine

## 2015-07-22 ENCOUNTER — Other Ambulatory Visit: Payer: Self-pay | Admitting: Internal Medicine

## 2015-07-22 DIAGNOSIS — M542 Cervicalgia: Secondary | ICD-10-CM | POA: Insufficient documentation

## 2015-07-22 DIAGNOSIS — R51 Headache: Secondary | ICD-10-CM | POA: Insufficient documentation

## 2015-07-22 DIAGNOSIS — M2578 Osteophyte, vertebrae: Secondary | ICD-10-CM | POA: Insufficient documentation

## 2015-07-22 DIAGNOSIS — R519 Headache, unspecified: Secondary | ICD-10-CM

## 2015-07-22 MED ORDER — PREDNISONE 10 MG PO TABS: ORAL_TABLET | ORAL | Status: DC

## 2015-07-22 NOTE — Telephone Encounter (Signed)
Prednisone taper sent to pharmacy. 

## 2015-07-22 NOTE — Telephone Encounter (Signed)
Pt called in extreme pain and wants to know if it's something else  she can take for her headache. Pt states the muscle relaxer that was prescribed is not working.  Also pt wants to know if the appt was made for her head xray? Pharmacy is [facility] [address]. Thank You!

## 2015-07-22 NOTE — Telephone Encounter (Signed)
Left message for patient to return call to office. 

## 2015-07-22 NOTE — Telephone Encounter (Signed)
Patient thought she was supposed to wait for an appointment for the X-ray advised patient she could go to [facility] and have x-ray anytime today before 7 PM patient stated she will go. Patient advised headache is unchanged and rated pain at a 9 to Occipital area. Patient stated the medication was not helping please advise if anything else can be called in.

## 2015-07-22 NOTE — Telephone Encounter (Signed)
Patient notified

## 2015-08-01 ENCOUNTER — Ambulatory Visit (INDEPENDENT_AMBULATORY_CARE_PROVIDER_SITE_OTHER): Payer: Medicare Other | Admitting: Internal Medicine

## 2015-08-01 ENCOUNTER — Encounter: Payer: Self-pay | Admitting: Internal Medicine

## 2015-08-01 VITALS — BP 160/78 | HR 67 | Temp 98.3°F | Resp 12 | Ht 61.0 in | Wt 163.2 lb

## 2015-08-01 DIAGNOSIS — R519 Headache, unspecified: Secondary | ICD-10-CM

## 2015-08-01 DIAGNOSIS — M542 Cervicalgia: Secondary | ICD-10-CM

## 2015-08-01 DIAGNOSIS — R51 Headache: Secondary | ICD-10-CM

## 2015-08-01 DIAGNOSIS — I2510 Atherosclerotic heart disease of native coronary artery without angina pectoris: Secondary | ICD-10-CM

## 2015-08-01 MED ORDER — TRAMADOL HCL 50 MG PO TABS: 100.0000 mg | ORAL_TABLET | Freq: Three times a day (TID) | ORAL | Status: DC | PRN

## 2015-08-01 MED ORDER — FENTANYL 50 MCG/HR TD PT72: 50.0000 ug | MEDICATED_PATCH | TRANSDERMAL | Status: DC

## 2015-08-01 MED ORDER — PREDNISONE 10 MG PO TABS: ORAL_TABLET | ORAL | Status: DC

## 2015-08-01 MED ORDER — METHYLPREDNISOLONE ACETATE 40 MG/ML IJ SUSP
40.0000 mg | Freq: Once | INTRAMUSCULAR | Status: AC
  Administered 2015-08-01: 40 mg via INTRAMUSCULAR

## 2015-08-01 NOTE — Progress Notes (Signed)
Pre-visit discussion using our clinic review tool. No additional management support is needed unless otherwise documented below in the visit note.  

## 2015-08-01 NOTE — Progress Notes (Signed)
Subjective:  Patient ID: Sabrina [name], female    DOB: Nov 28, 1940  Age: 74 y.o. MRN: [id number]  CC: The primary encounter diagnosis was Cervicalgia. A diagnosis of Recurrent occipital headache was also pertinent to this visit.  HPI Sabrina [name] presents for FOLLOW UP ON PERSISTENT HEADACHE which has been present for the past  3 WEEKS.  Tramadol and MR prescribed, and  Plain films of neck were done that suggested DDD.  She states that her pain is unbearable , pain not controlled with current theray  She is taking tramadol every 6 hours,  Tried doubling the dose which transiently helped.  Pain includes occiput,  neck and gums mandible. ,  Not sleepig wll.  Baclofen not helping  Despite taking once daily at night   The prednisone helped the pain but it kept her up at night. Even though she took it during the daytime.   MRI  Has been ordered of cervical spine.   Outpatient Prescriptions Prior to Visit  Medication Sig Dispense Refill  . amLODipine (NORVASC) 10 MG tablet Take 1 tablet (10 mg total) by mouth daily. 90 tablet 2  . aspirin 81 MG tablet Take 81 mg by mouth daily.    . baclofen (LIORESAL) 10 MG tablet Take 1 tablet (10 mg total) by mouth 3 (three) times daily. As needed for muscle spasm 90 each 0  . chlordiazePOXIDE (LIBRIUM) 25 MG capsule Take 1 capsule (25 mg total) by mouth daily as needed for anxiety. 30 capsule 5  . clopidogrel (PLAVIX) 75 MG tablet Take 1 tablet (75 mg total) by mouth once. 90 tablet 2  . esomeprazole (NEXIUM) 40 MG capsule Take 1 capsule (40 mg total) by mouth daily. 90 capsule 3  . ezetimibe (ZETIA) 10 MG tablet Take 1 tablet (10 mg total) by mouth daily. 90 tablet 2  . hydrALAZINE (APRESOLINE) 50 MG tablet Take 1 tablet (50 mg total) by mouth 3 (three) times daily as needed. 270 tablet 2  . labetalol (NORMODYNE) 200 MG tablet Take 0.5 tablets (100 mg total) by mouth 2 (two) times daily. 90 tablet 2  . lisinopril (PRINIVIL,ZESTRIL) 20 MG tablet  Take 1 tablet (20 mg total) by mouth daily. 90 tablet 2  . Omega-3 Fatty Acids (FISH OIL) 1000 MG CAPS Take 1 capsule by mouth daily.    . fentaNYL (DURAGESIC - DOSED MCG/HR) 25 MCG/HR patch Place 1 patch (25 mcg total) onto the skin every 3 (three) days. 10 patch 0  . traMADol (ULTRAM) 50 MG tablet Take 1 tablet (50 mg total) by mouth every 6 (six) hours as needed. 90 tablet 2  . Multiple Vitamin (MULTIVITAMIN WITH MINERALS) TABS tablet Take 1 tablet by mouth daily.    . predniSONE (DELTASONE) 10 MG tablet 6 tablets on Day 1 , then reduce by 1 tablet daily until gone (Patient not taking: Reported on 08/01/2015) 21 tablet 0   No facility-administered medications prior to visit.    Review of Systems;  Patient denies headache, fevers, malaise, unintentional weight loss, skin rash, eye pain, sinus congestion and sinus pain, sore throat, dysphagia,  hemoptysis , cough, dyspnea, wheezing, chest pain, palpitations, orthopnea, edema, abdominal pain, nausea, melena, diarrhea, constipation, flank pain, dysuria, hematuria, urinary  Frequency, nocturia, numbness, tingling, seizures,  Focal weakness, Loss of consciousness,  Tremor, insomnia, depression, anxiety, and suicidal ideation.      Objective:  BP 160/78 mmHg  Pulse 67  Temp(Src) 98.3 F (36.8 C) (Oral)  Resp 12  Ht 5\' 1"  (1.549 m)  Wt 163 lb 4 oz (74.05 kg)  BMI 30.86 kg/m2  SpO2 99%  BP Readings from Last 3 Encounters:  08/01/15 160/78  07/18/15 160/72  06/16/15 148/56    Wt Readings from Last 3 Encounters:  08/01/15 163 lb 4 oz (74.05 kg)  07/18/15 160 lb (72.576 kg)  05/19/15 155 lb 8 oz (70.534 kg)    General appearance: alert, cooperative and appears stated age Ears: normal TM's and external ear canals both ears Throat: lips, mucosa, and tongue normal; teeth and gums normal Neck: no adenopathy, no carotid bruit, supple, symmetrical, trachea midline and thyroid not enlarged, symmetric, no tenderness/mass/nodules Back:  symmetric, no curvature. ROM normal. No CVA tenderness. Lungs: clear to auscultation bilaterally Heart: regular rate and rhythm, S1, S2 normal, no murmur, click, rub or gallop Abdomen: soft, non-tender; bowel sounds normal; no masses,  no organomegaly Neuro: CNs 2-12 intact. DTRs 2+/4 in biceps, brachioradialis, patellars and achilles. Muscle strength 5/5 in upper and lower exremities. Fine resting tremor bilaterally both hands cerebellar function normal. Romberg negative.  No pronator drift.   Gait normal.  Pulses: 2+ and symmetric Skin: Skin color, texture, turgor normal. No rashes or lesions Lymph nodes: Cervical, supraclavicular, and axillary nodes normal.  Lab Results  Component Value Date   HGBA1C 5.8 06/02/2015    Lab Results  Component Value Date   CREATININE 1.08 06/02/2015   CREATININE 1.12 11/14/2014   CREATININE 1.2* 09/13/2014    Lab Results  Component Value Date   WBC 6.2 06/02/2015   HGB 11.7* 06/02/2015   HCT 35.0* 06/02/2015   PLT 262.0 06/02/2015   GLUCOSE 95 06/02/2015   CHOL 219* 06/02/2015   TRIG 83.0 06/02/2015   HDL 54.20 06/02/2015   LDLCALC 148* 06/02/2015   ALT 13 06/02/2015   AST 19 06/02/2015   NA 142 06/02/2015   K 4.7 06/02/2015   CL 102 06/02/2015   CREATININE 1.08 06/02/2015   BUN 17 06/02/2015   CO2 32 06/02/2015   TSH 5.74* 01/10/2014   INR 1.0 03/27/2014   HGBA1C 5.8 06/02/2015    Dg Cervical Spine Complete  07/22/2015   CLINICAL DATA:  74 year old female with constant headaches and neck pain. No reported history of trauma. Prior carotid surgery. Initial encounter.  EXAM: CERVICAL SPINE  4+ VIEWS  COMPARISON:  None.  FINDINGS: Normal alignment cervical spine.  Minimal C4-5 and mild C5-6 disc space narrowing with osteophyte formation.  C4-5 and C5-6 foraminal narrowing greatest on the right at the C5-6 level.  Lung apices are clear.  Vascular calcifications included prominent right carotid bifurcation calcifications. Prior left carotid  endarterectomy.  IMPRESSION: Minimal C4-5 and mild C5-6 disc space narrowing with osteophyte.  C4-5 and C5-6 foraminal narrowing greatest on the right at the C5-6 level   Electronically Signed   By: Genia Del M.D.   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  MRI cervical spine ordered,  ,  I have increased the Fentanyl patch to 50 mcg and refilled the tramadol.  Will need referral to Pain Clinic for management of pain given chronic narcotic use.       Relevant Medications   traMADol (ULTRAM) 50 MG tablet   fentaNYL (DURAGESIC - DOSED MCG/HR) 50 MCG/HR    Other Visit Diagnoses    Cervicalgia    -  Primary    Relevant Medications    methylPREDNISolone acetate (DEPO-MEDROL) injection 40 mg (Completed)    Other Relevant Orders    MR Cervical Spine Wo Contrast    Ambulatory referral to Pain Clinic     A total of 25 minutes of face to face time was spent with patient more than half of which was spent in counselling about the above mentioned conditions  and coordination of care   I have changed Ms. Tonner's traMADol, predniSONE, and fentaNYL. I am also having her maintain her aspirin, multivitamin with minerals, Fish Oil, chlordiazePOXIDE, amLODipine, clopidogrel, ezetimibe, hydrALAZINE, labetalol, lisinopril, esomeprazole, and baclofen. We administered methylPREDNISolone acetate.  Meds ordered this encounter  Medications  . traMADol (ULTRAM) 50 MG tablet    Sig: Take 2 tablets (100 mg total) by mouth every 8 (eight) hours as needed.    Dispense:  180 tablet    Refill:  3  . predniSONE (DELTASONE) 10 MG tablet    Sig: 4 tablets daily for 3 days, reduce by 1 tablet every 3 days until gone    Dispense:  30 tablet    Refill:  0  . fentaNYL (DURAGESIC - DOSED MCG/HR) 50 MCG/HR    Sig: Place 1 patch (50 mcg total) onto the skin every 3 (three) days.    Dispense:  10 patch      Refill:  0  . methylPREDNISolone acetate (DEPO-MEDROL) injection 40 mg    Sig:     Medications Discontinued During This Encounter  Medication Reason  . traMADol (ULTRAM) 50 MG tablet Reorder  . predniSONE (DELTASONE) 10 MG tablet Reorder  . fentaNYL (DURAGESIC - DOSED MCG/HR) 25 MCG/HR patch Reorder    Follow-up: No Follow-up on file.   Crecencio Mc, MD

## 2015-08-01 NOTE — Patient Instructions (Signed)
I am repeating your prednisone taper but starting it at a lower dose so it doesn't interfere with your sleep.    Start the oral dose tomorrow morning:  40 mg daily  (4 tablets) daily for 3 days, then 30 mg daily  (3 tablets) daily for 3 days, then 20 mg daily  (2 tablets) daily for 3 days, then 10 mg daily  (1 tablet) daily  for 3 days  You can increase the fentanyl patch to 50 mcg dose every 3 days.   You can use the tramadol if needed,  Up to 2 tablets every 8 hours maximum dose   MRI has been ordered and pain clinic referral

## 2015-08-03 NOTE — Assessment & Plan Note (Signed)
suspect cervical stenosis as cause.  MRI cervical spine ordered,  ,  I have increased the Fentanyl patch to 50 mcg and refilled the tramadol.  Will need referral to Pain Clinic for management of pain given chronic narcotic use.

## 2015-08-11 ENCOUNTER — Ambulatory Visit
Admission: RE | Admit: 2015-08-11 | Discharge: 2015-08-11 | Disposition: A | Discharge status: Home / Self Care | Payer: Medicare Other | Source: Ambulatory Visit | Attending: Internal Medicine | Admitting: Internal Medicine

## 2015-08-11 DIAGNOSIS — M50223 Other cervical disc displacement at C6-C7 level: Secondary | ICD-10-CM | POA: Insufficient documentation

## 2015-08-11 DIAGNOSIS — M2578 Osteophyte, vertebrae: Secondary | ICD-10-CM | POA: Diagnosis not present

## 2015-08-11 DIAGNOSIS — M542 Cervicalgia: Secondary | ICD-10-CM

## 2015-08-11 DIAGNOSIS — M47812 Spondylosis without myelopathy or radiculopathy, cervical region: Secondary | ICD-10-CM | POA: Diagnosis not present

## 2015-08-11 DIAGNOSIS — M5020 Other cervical disc displacement, unspecified cervical region: Secondary | ICD-10-CM | POA: Diagnosis not present

## 2015-08-11 DIAGNOSIS — M5030 Other cervical disc degeneration, unspecified cervical region: Secondary | ICD-10-CM | POA: Diagnosis not present

## 2015-08-11 DIAGNOSIS — M4802 Spinal stenosis, cervical region: Secondary | ICD-10-CM | POA: Insufficient documentation

## 2015-08-20 ENCOUNTER — Other Ambulatory Visit: Payer: Self-pay | Admitting: Internal Medicine

## 2015-08-20 DIAGNOSIS — M47812 Spondylosis without myelopathy or radiculopathy, cervical region: Secondary | ICD-10-CM | POA: Insufficient documentation

## 2015-08-29 ENCOUNTER — Ambulatory Visit: Payer: Medicare Other | Admitting: Internal Medicine

## 2015-08-29 ENCOUNTER — Telehealth: Payer: Self-pay | Admitting: Internal Medicine

## 2015-08-29 NOTE — Telephone Encounter (Signed)
Pt came in and dropped off her disability parking placard today. It is in your box up front. Thank yOu!

## 2015-08-29 NOTE — Telephone Encounter (Signed)
On Sabrina [name]'s desk to put in Red folder Monday.

## 2015-09-03 ENCOUNTER — Telehealth: Payer: Self-pay | Admitting: Internal Medicine

## 2015-09-03 NOTE — Telephone Encounter (Signed)
Called and patient to pick up today.

## 2015-09-03 NOTE — Telephone Encounter (Signed)
Application for disability Parking Placard has been signed and returned in red folder

## 2015-09-03 NOTE — Telephone Encounter (Signed)
Spoke with patient, forms are upfront to be picked up.

## 2015-09-04 DIAGNOSIS — M316 Other giant cell arteritis: Secondary | ICD-10-CM | POA: Diagnosis not present

## 2015-09-04 DIAGNOSIS — M5023 Other cervical disc displacement, cervicothoracic region: Secondary | ICD-10-CM | POA: Diagnosis not present

## 2015-09-12 DIAGNOSIS — N2581 Secondary hyperparathyroidism of renal origin: Secondary | ICD-10-CM | POA: Diagnosis not present

## 2015-09-12 DIAGNOSIS — R809 Proteinuria, unspecified: Secondary | ICD-10-CM | POA: Diagnosis not present

## 2015-09-12 DIAGNOSIS — N183 Chronic kidney disease, stage 3 (moderate): Secondary | ICD-10-CM | POA: Diagnosis not present

## 2015-09-12 DIAGNOSIS — I701 Atherosclerosis of renal artery: Secondary | ICD-10-CM | POA: Diagnosis not present

## 2015-09-12 DIAGNOSIS — I129 Hypertensive chronic kidney disease with stage 1 through stage 4 chronic kidney disease, or unspecified chronic kidney disease: Secondary | ICD-10-CM | POA: Diagnosis not present

## 2015-09-12 DIAGNOSIS — D631 Anemia in chronic kidney disease: Secondary | ICD-10-CM | POA: Diagnosis not present

## 2015-09-30 ENCOUNTER — Telehealth: Payer: Self-pay

## 2015-09-30 MED ORDER — FENTANYL 50 MCG/HR TD PT72
50.0000 ug | MEDICATED_PATCH | TRANSDERMAL | Status: DC
Start: 2015-09-30 — End: 2015-11-12

## 2015-09-30 NOTE — Telephone Encounter (Signed)
Pt will pick up this afternoon.

## 2015-09-30 NOTE — Telephone Encounter (Signed)
Pt is requesting a rx for Fentanyl 50 mcg # 10 sent to [facility].

## 2015-09-30 NOTE — Telephone Encounter (Signed)
Pt called about her medication fentaNYL (DURAGESIC - DOSED MCG/HR) 50 MCG/HR. Pt states it has to be picked up. Pt states she needs it today. Thank You!

## 2015-09-30 NOTE — Telephone Encounter (Signed)
It will not be ready until this afternoon .  She needs to ive > 24 hours notice in t he future

## 2015-10-14 ENCOUNTER — Other Ambulatory Visit: Payer: Self-pay | Admitting: Internal Medicine

## 2015-10-14 DIAGNOSIS — M4806 Spinal stenosis, lumbar region: Secondary | ICD-10-CM | POA: Diagnosis not present

## 2015-10-14 DIAGNOSIS — M5137 Other intervertebral disc degeneration, lumbosacral region: Secondary | ICD-10-CM | POA: Diagnosis not present

## 2015-10-21 ENCOUNTER — Telehealth: Payer: Self-pay | Admitting: Internal Medicine

## 2015-10-21 NOTE — Telephone Encounter (Signed)
Would you please call pt. Time for another letter to [facility] on her ezetimibe (ZETIA) 10 MG tablet.  Thank you

## 2015-10-22 NOTE — Telephone Encounter (Signed)
Started PA for Zetia on cover my meds. Patient notified PA started.

## 2015-10-27 ENCOUNTER — Other Ambulatory Visit: Payer: Self-pay | Admitting: Internal Medicine

## 2015-10-28 NOTE — Telephone Encounter (Signed)
LAst Office Visit was in October, Please advise refill?

## 2015-10-28 NOTE — Telephone Encounter (Signed)
Ok to refill,  printed rx  

## 2015-11-11 ENCOUNTER — Telehealth: Payer: Self-pay | Admitting: Internal Medicine

## 2015-11-11 NOTE — Telephone Encounter (Signed)
[facility] called needing a refill for fentaNYL (DURAGESIC - DOSED MCG/HR) 50 MCG/HR. Pharmacy is [id number] - [place] [address]), [place] - [address]. Call pt @ [phone] when it's ready . Thank You!

## 2015-11-12 ENCOUNTER — Other Ambulatory Visit: Payer: Self-pay

## 2015-11-12 MED ORDER — FENTANYL 50 MCG/HR TD PT72: 50.0000 ug | MEDICATED_PATCH | TRANSDERMAL | Status: DC

## 2015-11-12 NOTE — Telephone Encounter (Signed)
Please call pt when it's ready. Thank You!

## 2015-11-12 NOTE — Telephone Encounter (Signed)
Pt last OV 08/01/15, last filled 09/30/15 #10 patches 0 refills. Please advise./tvw

## 2015-11-12 NOTE — Telephone Encounter (Signed)
Pt last OV 08/01/15, last filled 09/30/15 #10 patches 0refills. Please advise/tvw

## 2015-11-12 NOTE — Telephone Encounter (Signed)
Refilled FOR 2 MONTHS . Opiates cannot be sent to pharmacy.. Patient must be called for pick up

## 2015-11-14 ENCOUNTER — Other Ambulatory Visit: Payer: Self-pay

## 2015-11-14 MED ORDER — FENTANYL 50 MCG/HR TD PT72: 50.0000 ug | MEDICATED_PATCH | TRANSDERMAL | Status: DC

## 2015-11-14 NOTE — Telephone Encounter (Signed)
Pt is calling about her fentaNYL refill. She wants to pick up prescription. The prescription is not in folder. Pt is out of medication.

## 2015-11-14 NOTE — Telephone Encounter (Signed)
Was able to get find both Rx's called pt for p/u

## 2015-11-26 ENCOUNTER — Encounter: Payer: Self-pay | Admitting: Family Medicine

## 2015-11-26 ENCOUNTER — Ambulatory Visit
Admission: RE | Admit: 2015-11-26 | Discharge: 2015-11-26 | Disposition: A | Discharge status: Home / Self Care | Payer: Medicare Other | Source: Ambulatory Visit | Attending: Family Medicine | Admitting: Family Medicine

## 2015-11-26 ENCOUNTER — Ambulatory Visit (INDEPENDENT_AMBULATORY_CARE_PROVIDER_SITE_OTHER): Payer: Medicare Other | Admitting: Family Medicine

## 2015-11-26 ENCOUNTER — Telehealth: Payer: Self-pay | Admitting: *Deleted

## 2015-11-26 VITALS — BP 152/88 | HR 94 | Temp 99.6°F | Ht 61.0 in | Wt 165.6 lb

## 2015-11-26 DIAGNOSIS — R059 Cough, unspecified: Secondary | ICD-10-CM

## 2015-11-26 DIAGNOSIS — J069 Acute upper respiratory infection, unspecified: Secondary | ICD-10-CM | POA: Insufficient documentation

## 2015-11-26 DIAGNOSIS — R05 Cough: Secondary | ICD-10-CM | POA: Diagnosis not present

## 2015-11-26 DIAGNOSIS — B9789 Other viral agents as the cause of diseases classified elsewhere: Secondary | ICD-10-CM

## 2015-11-26 DIAGNOSIS — Z87891 Personal history of nicotine dependence: Secondary | ICD-10-CM | POA: Diagnosis not present

## 2015-11-26 DIAGNOSIS — B349 Viral infection, unspecified: Secondary | ICD-10-CM | POA: Diagnosis not present

## 2015-11-26 DIAGNOSIS — J988 Other specified respiratory disorders: Secondary | ICD-10-CM

## 2015-11-26 DIAGNOSIS — R509 Fever, unspecified: Secondary | ICD-10-CM | POA: Diagnosis not present

## 2015-11-26 DIAGNOSIS — R42 Dizziness and giddiness: Secondary | ICD-10-CM | POA: Diagnosis not present

## 2015-11-26 MED ORDER — PREDNISONE 10 MG PO TABS: ORAL_TABLET | ORAL | Status: DC

## 2015-11-26 NOTE — Telephone Encounter (Signed)
Noted. She should hold this while on the prednisone.

## 2015-11-26 NOTE — Progress Notes (Signed)
Patient ID: Sabrina [name], female   DOB: 1941/09/05, 75 y.o.   MRN: [id number]  Tommi Rumps, MD Phone: [phone]  Sabrina [name] is a 75 y.o. female who presents today for same-day visit.  Patient presents with onset of symptoms on Sunday. Started with sore throat and postnasal drip. Then developed cough. She has a dull headache with this on the left side. She's had rhinorrhea with this. She notes when she lays down her nose becomes quite congested. She tries to sit up in her nose opens up to the rhinorrhea occurs down the back of her throat. Some lightheadedness as well. No chest pain, shortness of breath, numbness, weakness, vision changes, fevers, sick contacts, or medications tried. She does note bilateral lower ribs hurt only when she coughs. It is sharp in nature. Does not hurt with deep breaths or exertion. She now she has taken her blood pressure medicines as prescribed today. She notes her blood pressure typically runs around where it is now. When it is above 150 she notes she has been told to take a hydralazine. In the past she has taken Delsym for cough.  PMH: Former smoker.   ROS see history of present illness  Objective  Physical Exam Filed Vitals:   11/26/15 1306  BP: 152/88  Pulse: 94  Temp: 99.6 F (37.6 C)    BP Readings from Last 3 Encounters:  11/26/15 152/88  08/01/15 160/78  07/18/15 160/72   Wt Readings from Last 3 Encounters:  11/26/15 165 lb 9.6 oz (75.116 kg)  08/01/15 163 lb 4 oz (74.05 kg)  07/18/15 160 lb (72.576 kg)   Laying blood pressure 180/96 pulse 86 Sitting blood pressure 162/90 pulse 91 Standing blood pressure 142/76 pulse 94  Physical Exam  Constitutional: She is well-developed, well-nourished, and in no distress.  HENT:  Head: Normocephalic and atraumatic.  Right Ear: External ear normal.  Left Ear: External ear normal.  Mouth/Throat: Oropharynx is clear and moist. No oropharyngeal exudate.  Normal TMs bilaterally    Eyes: Conjunctivae are normal. Pupils are equal, round, and reactive to light.  Neck: Neck supple.  Cardiovascular: Normal rate, regular rhythm and normal heart sounds.   Pulmonary/Chest: Effort normal. No respiratory distress. She has no wheezes. She exhibits tenderness (minimal tenderness on palpation of bilateral lower anterior ribs).  Mild scattered crackles  Abdominal: Soft. She exhibits no distension. There is no tenderness. There is no rebound and no guarding.  Musculoskeletal: She exhibits no edema.  Lymphadenopathy:    She has no cervical adenopathy.  Neurological: She is alert.  CN 2-12 intact, 5/5 strength in bilateral biceps, triceps, grip, quads, hamstrings, plantar and dorsiflexion, sensation to light touch intact in bilateral UE and LE, normal gait, 2+ patellar reflexes  Skin: Skin is warm and dry. She is not diaphoretic.     Assessment/Plan: Please see individual problem list.  Viral respiratory illness Symptoms consistent with viral upper respiratory illness and possible bronchitis. No focal findings on exam to indicate bacterial illness. She does have scattered crackles in both lungs that could be consistent with bronchitis, though we will obtain a chest x-ray to ensure that she does not have a pneumonia. She is afebrile and her oxygen saturation is normal. She does appear to be orthostatic today. Suspect this is related to dehydration. She is on a number of blood pressure medicines though is not hypotensive on orthostatics. Her blood pressure appears to have been difficult to control in the past. Baseline blood pressure appears to  be 123456 systolic in the office in the past. Suspect initial orthostatic blood pressure to be elevated given her current viral illness. She has no chest pain, shortness of breath, or edema. She is neurologically intact. Last check of blood pressure near normal. Rib discomfort likely related to coughing. We will treat patient with prednisone. She will  take over-the-counter Delsym which she has taken in the past for her cough. We will obtain a chest x-ray. She was advised to monitor her blood pressure and she was given parameters to call with. She was advised to stay well hydrated. She was advised to avoid taking tramadol while taking Delsym. She is given return precautions.    Orders Placed This Encounter  Procedures  . DG Chest 2 View    Standing Status: Future     Number of Occurrences:      Standing Expiration Date: 01/23/2017    Order Specific Question:  Reason for Exam (SYMPTOM  OR DIAGNOSIS REQUIRED)    Answer:  cough, crackles    Order Specific Question:  Preferred imaging location?    Answer:  [facility] ordered this encounter  Medications  . predniSONE (DELTASONE) 10 MG tablet    Sig: Please take 60 mg (6 tablets) by mouth today, then decrease by one tablet daily until gone.    Dispense:  21 tablet    Refill:  0    Tommi Rumps

## 2015-11-26 NOTE — Telephone Encounter (Signed)
FYI

## 2015-11-26 NOTE — Progress Notes (Signed)
Pre visit review using our clinic review tool, if applicable. No additional management support is needed unless otherwise documented below in the visit note. 

## 2015-11-26 NOTE — Patient Instructions (Signed)
Nice to meet you. Your symptoms are likely related to viral illness. We will treat you with prednisone to help with her symptoms. She can take Tessalon for cough. Please go get the chest x-ray to evaluate her lungs. Please monitor your blood pressure and if it is above 170/110 please let us know. If you develop chest pain, shortness of breath, fevers, numbness, weakness, vision changes, persistent lightheadedness, or any new or changing symptoms please seek medical attention.

## 2015-11-26 NOTE — Telephone Encounter (Signed)
Please FYI Dr. Caryl Bis that patient is taking Meloxicam.  She was seen in the office today.

## 2015-11-26 NOTE — Assessment & Plan Note (Addendum)
Symptoms consistent with viral upper respiratory illness and possible bronchitis. No focal findings on exam to indicate bacterial illness. She does have scattered crackles in both lungs that could be consistent with bronchitis, though we will obtain a chest x-ray to ensure that she does not have a pneumonia. She is afebrile and her oxygen saturation is normal. She does appear to be orthostatic today. Suspect this is related to dehydration. She is on a number of blood pressure medicines though is not hypotensive on orthostatics. Her blood pressure appears to have been difficult to control in the past. Baseline blood pressure appears to be 123456 systolic in the office in the past. Suspect initial orthostatic blood pressure to be elevated given her current viral illness. She has no chest pain, shortness of breath, or edema. She is neurologically intact. Last check of blood pressure near normal. Rib discomfort likely related to coughing. We will treat patient with prednisone. She will take over-the-counter Delsym which she has taken in the past for her cough. We will obtain a chest x-ray. She was advised to monitor her blood pressure and she was given parameters to call with. She was advised to stay well hydrated. She was advised to avoid taking tramadol while taking Delsym. She is given return precautions.

## 2015-11-28 NOTE — Telephone Encounter (Signed)
Notified patient of this.

## 2015-12-03 ENCOUNTER — Ambulatory Visit (INDEPENDENT_AMBULATORY_CARE_PROVIDER_SITE_OTHER): Payer: Medicare Other | Admitting: Internal Medicine

## 2015-12-03 ENCOUNTER — Encounter: Payer: Self-pay | Admitting: Internal Medicine

## 2015-12-03 VITALS — BP 168/72 | HR 60 | Temp 98.6°F | Resp 12 | Ht 61.0 in | Wt 164.5 lb

## 2015-12-03 DIAGNOSIS — M542 Cervicalgia: Secondary | ICD-10-CM | POA: Diagnosis not present

## 2015-12-03 DIAGNOSIS — R42 Dizziness and giddiness: Secondary | ICD-10-CM

## 2015-12-03 DIAGNOSIS — I10 Essential (primary) hypertension: Secondary | ICD-10-CM | POA: Diagnosis not present

## 2015-12-03 MED ORDER — FENTANYL 50 MCG/HR TD PT72: 50.0000 ug | MEDICATED_PATCH | TRANSDERMAL | Status: DC

## 2015-12-03 MED ORDER — PREDNISONE 10 MG PO TABS: ORAL_TABLET | ORAL | Status: DC

## 2015-12-03 NOTE — Patient Instructions (Signed)
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 If it gets better with the prednisone,  We will need to find a way to help the inflammation long term'   Take the prednisone dose in the morning,  And take the baclofen in the evening to help you rest   Try using Afrin nasal spray twice daily for a few days for the dizziness, which may be due to inner ear problems

## 2015-12-03 NOTE — Progress Notes (Signed)
Subjective:  Patient ID: Sabrina [name], female    DOB: Oct 05, 1941  Age: 75 y.o. MRN: [id number]  CC: The primary encounter diagnosis was Cervicalgia. Diagnoses of Essential hypertension and Dizziness and giddiness were also pertinent to this visit.  HPI Sabrina [name] presents for follow up on viral URI.  Evaluated on Feb 1 with chest x ray which was normal and supportive care given,with prednisone and Deslym  No antibiotics given .  Felt dizzy this morning but no vertigo.   CC is new onset persistent headache that started yesterday  Left parietal and frontal ,  Dull  Continuous  Sinus drainage is yellow.  The prednisone taper Made her feel great,  Gave her lots of energy, felt better.  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  BP went up on Monday to 177/80,  Took hydralazine .  Pressure has been normal unti l yesterday .  Worried about her sister who is recovering from serious catastrophic illness.  Was in the ICU 3 weeks for  VDRF, ischemic colitis,  Kidney failure   .She is now  S/p colostomy. In [address] [facility]     Outpatient Prescriptions Prior to Visit  Medication Sig Dispense Refill  . amLODipine (NORVASC) 10 MG tablet Take 1 tablet (10 mg total) by mouth daily. 90 tablet 2  . aspirin 81 MG tablet Take 81 mg by mouth daily.    . baclofen (LIORESAL) 10 MG tablet TAKE ONE TABLET BY MOUTH THREE TIMES DAILY AS NEEDED FOR MUSCLE SPASMS 90 tablet 0  . chlordiazePOXIDE (LIBRIUM) 25 MG capsule TAKE ONE CAPSULE BY MOUTH ONCE DAILY AS NEEDED FOR ANXIETY 30 capsule 5  . clopidogrel (PLAVIX) 75 MG tablet Take 1 tablet (75 mg total) by mouth once. 90 tablet 2  . esomeprazole (NEXIUM) 40 MG capsule Take 1 capsule (40 mg total) by mouth daily. 90 capsule 3  . ezetimibe (ZETIA) 10 MG tablet Take 1 tablet (10 mg total) by mouth daily. 90 tablet 2  . hydrALAZINE (APRESOLINE) 50 MG tablet Take 1 tablet (50 mg total) by mouth 3 (three) times daily as needed. 270  tablet 2  . labetalol (NORMODYNE) 200 MG tablet Take 0.5 tablets (100 mg total) by mouth 2 (two) times daily. 90 tablet 2  . lisinopril (PRINIVIL,ZESTRIL) 20 MG tablet Take 1 tablet (20 mg total) by mouth daily. 90 tablet 2  . Multiple Vitamin (MULTIVITAMIN WITH MINERALS) TABS tablet Take 1 tablet by mouth daily.    . Omega-3 Fatty Acids (FISH OIL) 1000 MG CAPS Take 1 capsule by mouth daily.    . traMADol (ULTRAM) 50 MG tablet Take 2 tablets (100 mg total) by mouth every 8 (eight) hours as needed. 180 tablet 3  . fentaNYL (DURAGESIC - DOSED MCG/HR) 50 MCG/HR Place 1 patch (50 mcg total) onto the skin every 3 (three) days. 10 patch 0  . predniSONE (DELTASONE) 10 MG tablet Please take 60 mg (6 tablets) by mouth today, then decrease by one tablet daily until gone. 21 tablet 0   No facility-administered medications prior to visit.    Review of Systems;  Patient denies headache, fevers, malaise, unintentional weight loss, skin rash, eye pain, sinus congestion and sinus pain, sore throat, dysphagia,  hemoptysis , cough, dyspnea, wheezing, chest pain, palpitations, orthopnea, edema, abdominal pain, nausea, melena, diarrhea, constipation, flank pain, dysuria, hematuria, urinary  Frequency, nocturia, numbness, tingling, seizures,  Focal weakness, Loss of consciousness,  Tremor, insomnia,  depression, anxiety, and suicidal ideation.      Objective:  BP 168/72 mmHg  Pulse 60  Temp(Src) 98.6 F (37 C) (Oral)  Resp 12  Ht 5\' 1"  (1.549 m)  Wt 164 lb 8 oz (74.617 kg)  BMI 31.10 kg/m2  SpO2 98%  BP Readings from Last 3 Encounters:  12/03/15 168/72  11/26/15 152/88  08/01/15 160/78    Wt Readings from Last 3 Encounters:  12/03/15 164 lb 8 oz (74.617 kg)  11/26/15 165 lb 9.6 oz (75.116 kg)  08/01/15 163 lb 4 oz (74.05 kg)    General appearance: alert, cooperative and appears stated age Ears: normal TM's and external ear canals both ears Throat: lips, mucosa, and tongue normal; teeth and gums  normal Neck: no adenopathy, no carotid bruit, supple, symmetrical, trachea midline and thyroid not enlarged, symmetric, no tenderness/mass/nodules Back: symmetric, no curvature. ROM normal. No CVA tenderness. Lungs: clear to auscultation bilaterally Heart: regular rate and rhythm, S1, S2 normal, no murmur, click, rub or gallop Abdomen: soft, non-tender; bowel sounds normal; no masses,  no organomegaly Pulses: 2+ and symmetric Skin: Skin color, texture, turgor normal. No rashes or lesions Lymph nodes: Cervical, supraclavicular, and axillary nodes normal.  Lab Results  Component Value Date   HGBA1C 5.8 06/02/2015    Lab Results  Component Value Date   CREATININE 1.08 06/02/2015   CREATININE 1.12 11/14/2014   CREATININE 1.2* 09/13/2014    Lab Results  Component Value Date   WBC 6.2 06/02/2015   HGB 11.7* 06/02/2015   HCT 35.0* 06/02/2015   PLT 262.0 06/02/2015   GLUCOSE 95 06/02/2015   CHOL 219* 06/02/2015   TRIG 83.0 06/02/2015   HDL 54.20 06/02/2015   LDLCALC 148* 06/02/2015   ALT 13 06/02/2015   AST 19 06/02/2015   NA 142 06/02/2015   K 4.7 06/02/2015   CL 102 06/02/2015   CREATININE 1.08 06/02/2015   BUN 17 06/02/2015   CO2 32 06/02/2015   TSH 1.34 04/12/2014   INR 1.0 03/27/2014   HGBA1C 5.8 06/02/2015    Dg Chest 2 View  11/26/2015  CLINICAL DATA:  Fever with dizziness and cough for 3 days. Former smoker. Evaluate for pneumonia. EXAM: CHEST  2 VIEW COMPARISON:  01/30/2012. 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 There is aortic and coronary artery atherosclerosis. Vascular calcifications overlie the lung apices. The lungs are clear. There is no pleural effusion. Right-sided cervical rib noted. IMPRESSION: Stable postoperative chest.  No evidence of pneumonia. Electronically Signed   By: Richardean Sale M.D.   On: 11/26/2015 14:56    Assessment & Plan:   Problem List Items Addressed This Visit    HTN  (hypertension)    uncontrolled today due to anxiety.  She is using prn hydralazine doses for SBP > 160.      Cervicalgia - Primary    Current headache is likely due to C3 radiculopathy given normal HEENT exam and history of DDD. Will repeat the prednisone taper. Adding baclofen at night .  She has declined referral to Pain clinic       Dizziness and giddiness    Advised to add Afrin nasal spray       Relevant Medications   predniSONE (DELTASONE) 10 MG tablet      I am having Ms. Westberg maintain her aspirin, multivitamin with minerals, Fish Oil, amLODipine, clopidogrel, ezetimibe, hydrALAZINE, labetalol, lisinopril, esomeprazole, traMADol, baclofen, chlordiazePOXIDE, meloxicam, fentaNYL, and predniSONE.  Meds ordered this encounter  Medications  . meloxicam (MOBIC) 7.5 MG tablet    Sig: Take 1 tablet by mouth daily.  . fentaNYL (DURAGESIC - DOSED MCG/HR) 50 MCG/HR    Sig: Place 1 patch (50 mcg total) onto the skin every 3 (three) days.    Dispense:  10 patch    Refill:  0    MAY REFILL ON OR AFTER January 11 2016  . predniSONE (DELTASONE) 10 MG tablet    Sig: Please take 60 mg (6 tablets) by mouth today, then decrease by one tablet daily until gone.    Dispense:  21 tablet    Refill:  0    Medications Discontinued During This Encounter  Medication Reason  . predniSONE (DELTASONE) 10 MG tablet Reorder  . fentaNYL (DURAGESIC - DOSED MCG/HR) 50 MCG/HR Reorder  . predniSONE (DELTASONE) 10 MG tablet Reorder    Follow-up: No Follow-up on file.   Crecencio Mc, MD

## 2015-12-03 NOTE — Progress Notes (Signed)
Pre-visit discussion using our clinic review tool. No additional management support is needed unless otherwise documented below in the visit note.  

## 2015-12-04 DIAGNOSIS — R42 Dizziness and giddiness: Secondary | ICD-10-CM | POA: Insufficient documentation

## 2015-12-04 DIAGNOSIS — M542 Cervicalgia: Secondary | ICD-10-CM | POA: Insufficient documentation

## 2015-12-04 NOTE — Assessment & Plan Note (Addendum)
uncontrolled today due to anxiety.  She is using prn hydralazine doses for SBP > 160.

## 2015-12-04 NOTE — Assessment & Plan Note (Addendum)
Current headache is likely due to C3 radiculopathy given normal HEENT exam and history of DDD. Will repeat the prednisone taper. Adding baclofen at night .  She has declined referral to Pain clinic

## 2015-12-04 NOTE — Assessment & Plan Note (Signed)
Advised to add Afrin nasal spray

## 2015-12-11 ENCOUNTER — Telehealth: Payer: Self-pay | Admitting: Internal Medicine

## 2015-12-11 NOTE — Telephone Encounter (Signed)
Pt called about now having headaches again and dizzy since on Tuesday when she finished the predniSONE (DELTASONE) 10 MG tablet. Pt wants to know if she should go back on meloxicam (MOBIC) 7.5 MG tablet? Pt states she was fine while she was taking the meloxicam. Call pt @ [phone]. Thank you!

## 2015-12-11 NOTE — Telephone Encounter (Signed)
Spoke with patient, she will take the meloxicam.  She is not okay with a needle in the neck.  Thanks

## 2015-12-11 NOTE — Telephone Encounter (Signed)
Patient was seen on 2/8 for headaches.  She states that the first couple days she felt better, but Monday the headache and dizziness came back, she hasn't been out of bed since then.  Patient c/o of no other symptoms. Wants to know if she can restart the Meloxicam? Please advise.

## 2015-12-11 NOTE — Telephone Encounter (Signed)
Yes, she can resume the meloxicam,  But I strongly advise her to accept a referral to pain clinic or to a neurosurgeon to consider having epidural steroid injections to her cervical spine to decrease her headaches

## 2015-12-17 ENCOUNTER — Telehealth: Payer: Self-pay | Admitting: Internal Medicine

## 2015-12-17 MED ORDER — OMEPRAZOLE 40 MG PO CPDR: 40.0000 mg | DELAYED_RELEASE_CAPSULE | Freq: Every day | ORAL | Status: DC

## 2015-12-17 NOTE — Telephone Encounter (Signed)
Omeprazole sent to pharmacy for nexium substutition,  She cannot refill the fentanyl until March 19 per review of refill history.

## 2015-12-17 NOTE — Addendum Note (Signed)
Addended by: Crecencio Mc on: 12/17/2015 10:40 AM   Modules accepted: Orders

## 2015-12-17 NOTE — Telephone Encounter (Signed)
Pt called about wanting to know if she can take Prolisec instead of Nexium due to cost? Pt would like to received a call regarding if it's ok for the Prolisec so she can pick it up today. Pt needs a refill for fentaNYL (DURAGESIC - DOSED MCG/HR) 50 MCG/HR. Pharmacy is [id number] - [place] [address] [place] - [place]. Call pt @ [phone]. Thank you!

## 2015-12-17 NOTE — Telephone Encounter (Signed)
Patient had it last refill to be able to fill after March 19th?  Also please advise if she can switch the Nexium.  thanks

## 2015-12-17 NOTE — Telephone Encounter (Signed)
Spoke with the patient, verbalized understanding and agreed that her fentanyl is not due yet.

## 2015-12-18 ENCOUNTER — Telehealth: Payer: Self-pay | Admitting: Internal Medicine

## 2015-12-18 NOTE — Telephone Encounter (Signed)
Jasmine called from [place] on [address]. Stating that one of the patient's fentanyl patches was bad and they sent it back to determine the cause. The patient is now needing her refill for March to be filled sooner . The patient only has one patch left.

## 2015-12-19 ENCOUNTER — Telehealth: Payer: Self-pay | Admitting: Internal Medicine

## 2015-12-19 NOTE — Telephone Encounter (Signed)
Left a detailed message for the patient.  

## 2015-12-19 NOTE — Telephone Encounter (Signed)
Pt states she is still dizzy and since she stopped taking the meloxicam (MOBIC) 7.5 MG tablet she is not as dizzy and her sight is not as cloudy. If something else can be recommended other than ezetimibe (ZETIA) 10 MG tablet? I transferred pt to the nurse line due to her saying she was dizzy but pt was transferred back to the office.  Call pt@ [phone]. Thank you!

## 2015-12-19 NOTE — Telephone Encounter (Signed)
Spoke with the patient, clarification:  Since re starting the Meloxicam, she has started to have the dizziness again.    Second the zetia is to expensive for her to get, can we try something else cheaper per her request.  Thanks

## 2015-12-19 NOTE — Telephone Encounter (Signed)
Stop the meloxicam,  There are no alternatives to zetia since she has intolerance to statins.  The natural remedies for cholesterol have not been proven to reduce your risk for a heart attack.  Red Yeast Rice has not been proven either,  But does lower cholesterol, so if you want to try it , the dose is 600 mg twice daily in capsule form, available OTC.

## 2015-12-22 ENCOUNTER — Telehealth: Payer: Self-pay | Admitting: *Deleted

## 2015-12-22 NOTE — Telephone Encounter (Signed)
Pt stated that she did not need this medication at this time , she has it at the pharmacy.

## 2015-12-22 NOTE — Telephone Encounter (Signed)
Pt requested a Medication refill for fentyanyl Please advise

## 2015-12-22 NOTE — Telephone Encounter (Signed)
If she only returned one patch,  Then she will be due 3 days early  Which is march 16

## 2015-12-22 NOTE — Telephone Encounter (Signed)
Please advise 

## 2015-12-23 NOTE — Telephone Encounter (Signed)
See phone note from 12/22/15 states patient does not need this medication.

## 2015-12-23 NOTE — Telephone Encounter (Signed)
That was very odd, considering Vanessa's first message.

## 2015-12-24 ENCOUNTER — Telehealth: Payer: Self-pay | Admitting: Internal Medicine

## 2015-12-24 NOTE — Telephone Encounter (Signed)
Pt called stating you left her a vm. Pt was returning your call. Call pt @ [phone]. Thank you!

## 2016-01-08 ENCOUNTER — Other Ambulatory Visit: Payer: Self-pay | Admitting: Internal Medicine

## 2016-01-08 DIAGNOSIS — Z760 Encounter for issue of repeat prescription: Secondary | ICD-10-CM

## 2016-01-08 NOTE — Telephone Encounter (Signed)
Refill sent.

## 2016-01-18 ENCOUNTER — Other Ambulatory Visit: Payer: Self-pay | Admitting: Internal Medicine

## 2016-01-19 ENCOUNTER — Telehealth: Payer: Self-pay | Admitting: Internal Medicine

## 2016-01-19 ENCOUNTER — Other Ambulatory Visit: Payer: Self-pay | Admitting: Internal Medicine

## 2016-01-19 DIAGNOSIS — I10 Essential (primary) hypertension: Secondary | ICD-10-CM

## 2016-01-19 MED ORDER — CLOPIDOGREL BISULFATE 75 MG PO TABS: 75.0000 mg | ORAL_TABLET | Freq: Once | ORAL | Status: DC

## 2016-01-19 MED ORDER — LABETALOL HCL 200 MG PO TABS: 100.0000 mg | ORAL_TABLET | Freq: Two times a day (BID) | ORAL | Status: DC

## 2016-01-19 NOTE — Telephone Encounter (Signed)
Pt called needing a refill for clopidogrel (PLAVIX) 75 MG tablet and labetalol (NORMODYNE) 200 MG tablet. Pharmacy is [id number] - [place] [address] [place] - [place]. Call pt@ [phone]. Thank you!

## 2016-01-19 NOTE — Telephone Encounter (Signed)
Medication filled.  

## 2016-01-19 NOTE — Telephone Encounter (Signed)
Pt called to check the status of her Rx. Thank you!

## 2016-01-20 NOTE — Telephone Encounter (Signed)
Ok. Thank you.

## 2016-01-23 ENCOUNTER — Other Ambulatory Visit: Payer: Self-pay | Admitting: Internal Medicine

## 2016-02-19 DIAGNOSIS — Z961 Presence of intraocular lens: Secondary | ICD-10-CM | POA: Diagnosis not present

## 2016-02-21 ENCOUNTER — Other Ambulatory Visit: Payer: Self-pay | Admitting: Internal Medicine

## 2016-03-01 ENCOUNTER — Other Ambulatory Visit: Payer: Self-pay | Admitting: Internal Medicine

## 2016-03-01 MED ORDER — FENTANYL 50 MCG/HR TD PT72: 50.0000 ug | MEDICATED_PATCH | TRANSDERMAL | Status: DC

## 2016-03-01 NOTE — Telephone Encounter (Signed)
REFILL AUTHORIZED AND PRINTED

## 2016-03-01 NOTE — Telephone Encounter (Signed)
Notified patient script ready for pick up and placed at front desk.

## 2016-03-01 NOTE — Telephone Encounter (Signed)
Pt needs a refill on fentaNYL (DURAGESIC - DOSED MCG/HR) 50 MCG/HR. Please call when ready.

## 2016-03-01 NOTE — Telephone Encounter (Signed)
LAst script was sent on February to fill in March.  Please advise? thanks

## 2016-03-02 ENCOUNTER — Telehealth: Payer: Self-pay | Admitting: *Deleted

## 2016-03-02 NOTE — Telephone Encounter (Signed)
Spoke with patient and scheduled for Friday at 1pm. thanks

## 2016-03-02 NOTE — Telephone Encounter (Signed)
1:00 on Friday is the earliest I can do

## 2016-03-02 NOTE — Telephone Encounter (Signed)
Patient requested a ASAP appt. To see Dr.Tullo. She having headaches and blood pressure issues she wanted to discuss. Please advise

## 2016-03-02 NOTE — Telephone Encounter (Signed)
Please advise an appt time/date.  All your 1130/430 are taken this week. Thanks

## 2016-03-03 ENCOUNTER — Ambulatory Visit: Payer: Medicare Other | Admitting: Internal Medicine

## 2016-03-05 ENCOUNTER — Encounter: Payer: Self-pay | Admitting: Internal Medicine

## 2016-03-05 ENCOUNTER — Ambulatory Visit (INDEPENDENT_AMBULATORY_CARE_PROVIDER_SITE_OTHER): Payer: Medicare Other | Admitting: Internal Medicine

## 2016-03-05 VITALS — BP 156/80 | HR 80 | Wt 160.0 lb

## 2016-03-05 DIAGNOSIS — M542 Cervicalgia: Secondary | ICD-10-CM

## 2016-03-05 DIAGNOSIS — I10 Essential (primary) hypertension: Secondary | ICD-10-CM

## 2016-03-05 DIAGNOSIS — E785 Hyperlipidemia, unspecified: Secondary | ICD-10-CM

## 2016-03-05 DIAGNOSIS — R42 Dizziness and giddiness: Secondary | ICD-10-CM

## 2016-03-05 DIAGNOSIS — R51 Headache: Secondary | ICD-10-CM

## 2016-03-05 DIAGNOSIS — R1013 Epigastric pain: Secondary | ICD-10-CM

## 2016-03-05 DIAGNOSIS — R519 Headache, unspecified: Secondary | ICD-10-CM

## 2016-03-05 MED ORDER — LISINOPRIL 40 MG PO TABS: 40.0000 mg | ORAL_TABLET | Freq: Every day | ORAL | Status: DC

## 2016-03-05 MED ORDER — PREDNISONE 10 MG PO TABS: ORAL_TABLET | ORAL | Status: DC

## 2016-03-05 NOTE — Patient Instructions (Addendum)
Increase the lisinopril to 40 mg daily for your blood pressure  Referral to Dr Delana Meyer  to evaluate the renal arteries to make sure you do not have a blockage   For the headache and dizziness:  Prednisone taper for 6 days.  Take all meds IN THE MORNING  Continue baclofen at night  Neurosurgery referral   Labs and possible imaging of the abdomen depending on the labs

## 2016-03-05 NOTE — Progress Notes (Signed)
Subjective:  Patient ID: Sabrina [name], female    DOB: [birth year]  Age: 75 y.o. MRN: [id number]  CC: The primary encounter diagnosis was Abdominal pain, epigastric. Diagnoses of Hyperlipidemia LDL goal <100, Severe uncontrolled hypertension, Cervicalgia, Recurrent occipital headache, Essential hypertension, and Dizziness and giddiness were also pertinent to this visit.  HPI [name] presents for worsening  headache and elevated blood pressure readings  She has  Multilevel cervical disc degeneration with moderate spinal stenosis and moderate neural foraminal stenosis at C4-5 and C5-6, and  Left-sided disc extrusion at C6-7 which may affect the left C7 nerve. She was referred to neurosurgery several months ago but states that she was never called with an appointment.   Extremely dizzy, thinks it is aggravated by new set of eyeglasses.  No sinus congestion, sneezing or watering eyes. No spinning,  But feels off balance. Episodic, not occurring daily    Terrible  epigastic  pain in her stomach, episodic not daily .   Up high,  sometimes around the navel  Not taking any nsaids.  Taking nexium but doesn't feel like it is helping .  NO diarrhea.  Moves her bowels every other day.   BP has been spiking around 200 despite use of 4 medications to control it.   needs RAS workup    Had a bump on her head when changiing a light bulb.  The globe  fell and hit her on the top of the head a week ago.   Outpatient Prescriptions Prior to Visit  Medication Sig Dispense Refill  . amLODipine (NORVASC) 10 MG tablet TAKE ONE TABLET BY MOUTH ONCE DAILY 90 tablet 1  . aspirin 81 MG tablet Take 81 mg by mouth daily.    . baclofen (LIORESAL) 10 MG tablet TAKE ONE TABLET BY MOUTH THREE TIMES DAILY AS NEEDED FOR MUSCLE SPASM 90 tablet 0  . chlordiazePOXIDE (LIBRIUM) 25 MG capsule TAKE ONE CAPSULE BY MOUTH ONCE DAILY AS NEEDED FOR ANXIETY 30 capsule 5  . clopidogrel (PLAVIX) 75 MG tablet Take 1 tablet  (75 mg total) by mouth once. 90 tablet 2  . esomeprazole (NEXIUM) 40 MG capsule Take 1 capsule (40 mg total) by mouth daily. 90 capsule 3  . fentaNYL (DURAGESIC - DOSED MCG/HR) 50 MCG/HR Place 1 patch (50 mcg total) onto the skin every 3 (three) days. 10 patch 0  . hydrALAZINE (APRESOLINE) 50 MG tablet Take 1 tablet (50 mg total) by mouth 3 (three) times daily as needed. 270 tablet 2  . labetalol (NORMODYNE) 200 MG tablet Take 0.5 tablets (100 mg total) by mouth 2 (two) times daily. 90 tablet 2  . Omega-3 Fatty Acids (FISH OIL) 1000 MG CAPS Take 1 capsule by mouth daily.    Marland Kitchen omeprazole (PRILOSEC) 40 MG capsule Take 1 capsule (40 mg total) by mouth daily. 30 capsule 3  . traMADol (ULTRAM) 50 MG tablet Take 2 tablets (100 mg total) by mouth every 8 (eight) hours as needed. 180 tablet 3  . ZETIA 10 MG tablet TAKE ONE TABLET BY MOUTH ONCE DAILY 90 tablet 0  . ZETIA 10 MG tablet TAKE ONE TABLET BY MOUTH ONCE DAILY 90 tablet 1  . lisinopril (PRINIVIL,ZESTRIL) 20 MG tablet TAKE ONE TABLET BY MOUTH ONCE DAILY 90 tablet 2  . meloxicam (MOBIC) 7.5 MG tablet Take 1 tablet by mouth daily. Reported on 03/05/2016    . Multiple Vitamin (MULTIVITAMIN WITH MINERALS) TABS tablet Take 1 tablet by mouth daily. Reported on 03/05/2016    .  predniSONE (DELTASONE) 10 MG tablet Please take 60 mg (6 tablets) by mouth today, then decrease by one tablet daily until gone. (Patient not taking: Reported on 03/05/2016) 21 tablet 0   No facility-administered medications prior to visit.    Review of Systems;  Patient denies headache, fevers, malaise, unintentional weight loss, skin rash, eye pain, sinus congestion and sinus pain, sore throat, dysphagia,  hemoptysis , cough, dyspnea, wheezing, chest pain, palpitations, orthopnea, edema, abdominal pain, nausea, melena, diarrhea, constipation, flank pain, dysuria, hematuria, urinary  Frequency, nocturia, numbness, tingling, seizures,  Focal weakness, Loss of consciousness,  Tremor,  insomnia, depression, anxiety, and suicidal ideation.      Objective:  BP 156/80 mmHg  Pulse 80  Wt 160 lb (72.576 kg)  BP Readings from Last 3 Encounters:  03/05/16 156/80  12/03/15 168/72  11/26/15 152/88    Wt Readings from Last 3 Encounters:  03/05/16 160 lb (72.576 kg)  12/03/15 164 lb 8 oz (74.617 kg)  11/26/15 165 lb 9.6 oz (75.116 kg)    General appearance: alert, cooperative and appears stated age Ears: normal TM's and external ear canals both ears Throat: lips, mucosa, and tongue normal; teeth and gums normal Neck: no adenopathy, no carotid bruit, supple, symmetrical, trachea midline, cervical spine tenderness without masses/ nodules Back: symmetric, no curvature. ROM normal. No CVA tenderness. Lungs: clear to auscultation bilaterally Heart: regular rate and rhythm, S1, S2 normal, no murmur, click, rub or gallop Abdomen: soft, non-tender; bowel sounds normal; no masses,  no organomegaly Pulses: 2+ and symmetric Skin: Skin color, texture, turgor normal. No rashes or lesions Lymph nodes: Cervical, supraclavicular, and axillary nodes normal.  Lab Results  Component Value Date   HGBA1C 5.8 06/02/2015    Lab Results  Component Value Date   CREATININE 1.08 06/02/2015   CREATININE 1.12 11/14/2014   CREATININE 1.2* 09/13/2014    Lab Results  Component Value Date   WBC 6.2 06/02/2015   HGB 11.7* 06/02/2015   HCT 35.0* 06/02/2015   PLT 262.0 06/02/2015   GLUCOSE 95 06/02/2015   CHOL 219* 06/02/2015   TRIG 83.0 06/02/2015   HDL 54.20 06/02/2015   LDLCALC 148* 06/02/2015   ALT 13 06/02/2015   AST 19 06/02/2015   NA 142 06/02/2015   K 4.7 06/02/2015   CL 102 06/02/2015   CREATININE 1.08 06/02/2015   BUN 17 06/02/2015   CO2 32 06/02/2015   TSH 1.34 04/12/2014   INR 1.0 03/27/2014   HGBA1C 5.8 06/02/2015    Dg Chest 2 View  11/26/2015  CLINICAL DATA:  Fever with dizziness and cough for 3 days. Former smoker. Evaluate for pneumonia. EXAM: CHEST  2 VIEW  COMPARISON:  01/30/2012. 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 There is aortic and coronary artery atherosclerosis. Vascular calcifications overlie the lung apices. The lungs are clear. There is no pleural effusion. Right-sided cervical rib noted. IMPRESSION: Stable postoperative chest.  No evidence of pneumonia. Electronically Signed   By: Richardean Sale M.D.   On: 11/26/2015 14:56    Assessment & Plan:   Problem List Items Addressed This Visit    HTN (hypertension)    uncontrolled on 4 medications. Increase lisinopril to 40 mg daily.  Secondary causes need ruling out. Referral to AV [facility] for renal artery doppler.   Lab Results  Component Value Date   CREATININE 1.08 06/02/2015   Lab Results  Component Value Date   NA 142 06/02/2015  K 4.7 06/02/2015   CL 102 06/02/2015   CO2 32 06/02/2015         Relevant Medications   lisinopril (PRINIVIL,ZESTRIL) 40 MG tablet   Abdominal pain, epigastric - Primary    Recent epigastric pain, despite use of Nexium. Testing for H. Pylori  Was negative last year. Discussed that she may need referral for EGD if symptoms are persistent.  Lab Results  Component Value Date   ALT 13 06/02/2015   AST 19 06/02/2015   ALKPHOS 77 06/02/2015   BILITOT 0.8 06/02/2015   No results found for: LIPASE .        Relevant Orders   Comprehensive metabolic panel   Lipase   Recurrent occipital headache    Secondary to cervical spine stenosis by MRI,.  Prednisone taper,  Neurosurgery referral will again be placed.       Dizziness and giddiness    Prednisone taperfor eustachian tube dysfunction .         Relevant Medications   predniSONE (DELTASONE) 10 MG tablet   Cervicalgia   Relevant Orders   Ambulatory referral to Neurosurgery    Other Visit Diagnoses    Hyperlipidemia LDL goal <100        Relevant Medications    lisinopril (PRINIVIL,ZESTRIL) 40 MG tablet    Other Relevant  Orders    LDL cholesterol, direct    Severe uncontrolled hypertension        Relevant Medications    lisinopril (PRINIVIL,ZESTRIL) 40 MG tablet    Other Relevant Orders    Ambulatory referral to Vascular Surgery       I have discontinued Ms. Rosenstock's multivitamin with minerals, meloxicam, and predniSONE. I have also changed her lisinopril. Additionally, I am having her start on predniSONE. Lastly, I am having her maintain her aspirin, Fish Oil, hydrALAZINE, esomeprazole, traMADol, chlordiazePOXIDE, omeprazole, ZETIA, baclofen, amLODipine, ZETIA, clopidogrel, labetalol, and fentaNYL.  Meds ordered this encounter  Medications  . predniSONE (DELTASONE) 10 MG tablet    Sig: 6 tablets on Day 1 , then reduce by 1 tablet daily until gone    Dispense:  21 tablet    Refill:  0  . lisinopril (PRINIVIL,ZESTRIL) 40 MG tablet    Sig: Take 1 tablet (40 mg total) by mouth daily.    Dispense:  90 tablet    Refill:  1    Medications Discontinued During This Encounter  Medication Reason  . meloxicam (MOBIC) 7.5 MG tablet Completed Course  . Multiple Vitamin (MULTIVITAMIN WITH MINERALS) TABS tablet Completed Course  . predniSONE (DELTASONE) 10 MG tablet Completed Course  . lisinopril (PRINIVIL,ZESTRIL) 20 MG tablet Reorder    Follow-up: Return in about 3 months (around 06/05/2016).   Crecencio Mc, MD

## 2016-03-07 NOTE — Assessment & Plan Note (Signed)
Recent epigastric pain, despite use of Nexium. Testing for H. Pylori  Was negative last year. Discussed that she may need referral for EGD if symptoms are persistent.  Lab Results  Component Value Date   ALT 13 06/02/2015   AST 19 06/02/2015   ALKPHOS 77 06/02/2015   BILITOT 0.8 06/02/2015   No results found for: LIPASE .

## 2016-03-07 NOTE — Assessment & Plan Note (Signed)
Prednisone taperfor eustachian tube dysfunction .

## 2016-03-07 NOTE — Assessment & Plan Note (Addendum)
uncontrolled on 4 medications. Increase lisinopril to 40 mg daily.  Secondary causes need ruling out. Referral to [facility] for renal artery doppler.   Lab Results  Component Value Date   CREATININE 1.08 06/02/2015   Lab Results  Component Value Date   NA 142 06/02/2015   K 4.7 06/02/2015   CL 102 06/02/2015   CO2 32 06/02/2015

## 2016-03-07 NOTE — Assessment & Plan Note (Addendum)
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  Prednisone taper,  Neurosurgery referral will again be placed.

## 2016-03-08 ENCOUNTER — Other Ambulatory Visit: Payer: Medicare Other

## 2016-03-08 DIAGNOSIS — Z79899 Other long term (current) drug therapy: Secondary | ICD-10-CM | POA: Diagnosis not present

## 2016-03-08 DIAGNOSIS — Z79891 Long term (current) use of opiate analgesic: Secondary | ICD-10-CM | POA: Diagnosis not present

## 2016-03-08 LAB — COMPREHENSIVE METABOLIC PANEL
ALT: 14 U/L (ref 0–35)
AST: 19 U/L (ref 0–37)
Albumin: 4.2 g/dL (ref 3.5–5.2)
Alkaline Phosphatase: 83 U/L (ref 39–117)
BUN: 18 mg/dL (ref 6–23)
CO2: 26 mEq/L (ref 19–32)
Calcium: 9.7 mg/dL (ref 8.4–10.5)
Chloride: 103 mEq/L (ref 96–112)
Creatinine, Ser: 1.02 mg/dL (ref 0.40–1.20)
GFR: 68.12 mL/min (ref >60.00)
Glucose, Bld: 163 mg/dL — ABNORMAL HIGH (ref 70–99)
Potassium: 4.8 mEq/L (ref 3.5–5.1)
Sodium: 136 mEq/L (ref 135–145)
Total Bilirubin: 0.4 mg/dL (ref 0.2–1.2)
Total Protein: 7.2 g/dL (ref 6.0–8.3)

## 2016-03-08 LAB — LDL CHOLESTEROL, DIRECT: Direct LDL: 109 mg/dL

## 2016-03-08 LAB — LIPASE: Lipase: 14 U/L (ref 11.0–59.0)

## 2016-03-09 ENCOUNTER — Encounter: Payer: Self-pay | Admitting: *Deleted

## 2016-03-12 DIAGNOSIS — N183 Chronic kidney disease, stage 3 (moderate): Secondary | ICD-10-CM | POA: Diagnosis not present

## 2016-03-12 DIAGNOSIS — I129 Hypertensive chronic kidney disease with stage 1 through stage 4 chronic kidney disease, or unspecified chronic kidney disease: Secondary | ICD-10-CM | POA: Diagnosis not present

## 2016-03-12 DIAGNOSIS — I701 Atherosclerosis of renal artery: Secondary | ICD-10-CM | POA: Diagnosis not present

## 2016-03-12 DIAGNOSIS — R809 Proteinuria, unspecified: Secondary | ICD-10-CM | POA: Diagnosis not present

## 2016-03-17 ENCOUNTER — Telehealth: Payer: Self-pay | Admitting: Internal Medicine

## 2016-03-17 NOTE — Telephone Encounter (Signed)
Called patient. Gave lab results. Patient verbalized understanding.  

## 2016-03-17 NOTE — Telephone Encounter (Signed)
Pt called to follow up on her lab results form 03/09/16 letter that was mailed out she said she did not get that letter.   Call pt @ [phone]. Thank you!

## 2016-03-18 DIAGNOSIS — I129 Hypertensive chronic kidney disease with stage 1 through stage 4 chronic kidney disease, or unspecified chronic kidney disease: Secondary | ICD-10-CM | POA: Diagnosis not present

## 2016-03-18 DIAGNOSIS — I6529 Occlusion and stenosis of unspecified carotid artery: Secondary | ICD-10-CM | POA: Diagnosis not present

## 2016-03-18 DIAGNOSIS — M1990 Unspecified osteoarthritis, unspecified site: Secondary | ICD-10-CM | POA: Diagnosis not present

## 2016-03-18 DIAGNOSIS — I701 Atherosclerosis of renal artery: Secondary | ICD-10-CM | POA: Diagnosis not present

## 2016-03-18 DIAGNOSIS — I70213 Atherosclerosis of native arteries of extremities with intermittent claudication, bilateral legs: Secondary | ICD-10-CM | POA: Diagnosis not present

## 2016-03-18 DIAGNOSIS — E785 Hyperlipidemia, unspecified: Secondary | ICD-10-CM | POA: Diagnosis not present

## 2016-03-18 DIAGNOSIS — E669 Obesity, unspecified: Secondary | ICD-10-CM | POA: Diagnosis not present

## 2016-03-18 DIAGNOSIS — M79609 Pain in unspecified limb: Secondary | ICD-10-CM | POA: Diagnosis not present

## 2016-03-18 DIAGNOSIS — E119 Type 2 diabetes mellitus without complications: Secondary | ICD-10-CM | POA: Diagnosis not present

## 2016-03-18 DIAGNOSIS — G459 Transient cerebral ischemic attack, unspecified: Secondary | ICD-10-CM | POA: Diagnosis not present

## 2016-03-23 ENCOUNTER — Telehealth: Payer: Self-pay | Admitting: Internal Medicine

## 2016-03-23 ENCOUNTER — Other Ambulatory Visit: Payer: Self-pay | Admitting: Internal Medicine

## 2016-03-23 NOTE — Telephone Encounter (Signed)
Pt called stating that someone from our office called her about another appointment.Marland Kitchen Spoke to referrals they didn't call pt.. Pt wants to speak to [facility].. Please advise.. Thanks

## 2016-03-23 NOTE — Telephone Encounter (Signed)
Dr. Juleen China on clonidine 0.1 mg last Tuesday for elevated BP, you had increased Lisinopril the Friday before to 40 mg patient BP dropped 98/57 she stopped the clonidine should patient continue the 40 mg lisinopril.

## 2016-03-23 NOTE — Telephone Encounter (Signed)
Continue the lisinopril and stop the clonidine.  Reduce the lisinopril if BPs are consistently < 110/70

## 2016-03-24 NOTE — Telephone Encounter (Signed)
Patient notified and voiced understanding.

## 2016-03-31 ENCOUNTER — Other Ambulatory Visit: Payer: Self-pay | Admitting: Internal Medicine

## 2016-03-31 NOTE — Telephone Encounter (Signed)
Refilled

## 2016-03-31 NOTE — Telephone Encounter (Signed)
Ok to refill tramadol last fill 10/16 for 180 Tabs with 3 refills.

## 2016-04-01 ENCOUNTER — Encounter: Payer: Self-pay | Admitting: Internal Medicine

## 2016-04-01 NOTE — Telephone Encounter (Signed)
Rx faxed

## 2016-04-13 ENCOUNTER — Telehealth: Payer: Self-pay | Admitting: Internal Medicine

## 2016-04-13 ENCOUNTER — Ambulatory Visit: Payer: Medicare Other

## 2016-04-13 ENCOUNTER — Other Ambulatory Visit (HOSPITAL_COMMUNITY): Payer: Self-pay | Admitting: Neurosurgery

## 2016-04-13 VITALS — BP 162/66 | HR 69 | Resp 18

## 2016-04-13 DIAGNOSIS — M5136 Other intervertebral disc degeneration, lumbar region: Secondary | ICD-10-CM

## 2016-04-13 DIAGNOSIS — I10 Essential (primary) hypertension: Secondary | ICD-10-CM

## 2016-04-13 MED ORDER — FENTANYL 50 MCG/HR TD PT72: 50.0000 ug | MEDICATED_PATCH | TRANSDERMAL | Status: DC

## 2016-04-13 MED ORDER — ZETIA 10 MG PO TABS: 10.0000 mg | ORAL_TABLET | Freq: Every day | ORAL | Status: DC

## 2016-04-13 NOTE — Progress Notes (Signed)
Patient came into the office for a BP check.  BP has been running high.  BP checked in bilateral upper extremities.  See vitals for details.

## 2016-04-13 NOTE — Addendum Note (Signed)
Addended by: Crecencio Mc on: 04/13/2016 04:32 PM   Modules accepted: Orders

## 2016-04-13 NOTE — Telephone Encounter (Signed)
Pt wanted Dr. Derrel Nip to know that she thinks that her lisinopril (PRINIVIL,ZESTRIL) 40 MG tablet is too strong. She wants to go back down to 20mg .

## 2016-04-13 NOTE — Telephone Encounter (Signed)
Pt wants a refill on her fentaNYL (DURAGESIC - DOSED MCG/HR) 50 MCG/HR.   Thanks

## 2016-04-13 NOTE — Telephone Encounter (Signed)
Refill sent.

## 2016-04-13 NOTE — Telephone Encounter (Signed)
Refill printed 

## 2016-04-13 NOTE — Telephone Encounter (Signed)
Please advise 

## 2016-04-13 NOTE — Telephone Encounter (Signed)
She is also requesting a refill on her ZETIA 10 MG tablet.

## 2016-04-14 NOTE — Telephone Encounter (Signed)
Patient notified script ready and placed at front desk.

## 2016-04-18 NOTE — Progress Notes (Signed)
  I have reviewed the above information and agree with above. Patient should increase her labetalol to a full tablet twice daily  And if not < 140/80 in a week, make appt.   Deborra Medina, MD

## 2016-04-19 ENCOUNTER — Other Ambulatory Visit: Payer: Self-pay | Admitting: Internal Medicine

## 2016-04-20 NOTE — Progress Notes (Signed)
Left a VM to return my call. thanks

## 2016-04-21 NOTE — Telephone Encounter (Signed)
Pt was in today. Her lisinopril (PRINIVIL,ZESTRIL) 40 MG tablet is still making her dizzy. She will call Friday. She wants a lower dosage.

## 2016-04-22 NOTE — Addendum Note (Signed)
Addended by: Nanci Pina on: 04/22/2016 12:01 PM   Modules accepted: Medications

## 2016-04-22 NOTE — Telephone Encounter (Signed)
She can,  But she should first try taking 40 mg as a divided dose,  20 mg twice daily insterad

## 2016-04-22 NOTE — Telephone Encounter (Signed)
Patient staed that ever since dosage increase from 20 mg lisinopril to  40 mg she has been feeling dizzy, she tried to give it some time to adjust to dosage change but staed she just feels to dizzy would like to go back to lower dose.

## 2016-04-22 NOTE — Telephone Encounter (Signed)
Patient agreed to take 20 mg in the AM and 20 mg in the PM of lisinopril and try that first.

## 2016-04-22 NOTE — Telephone Encounter (Signed)
Updated chart.

## 2016-05-05 ENCOUNTER — Ambulatory Visit
Admission: RE | Admit: 2016-05-05 | Discharge: 2016-05-05 | Disposition: A | Discharge status: Home / Self Care | Payer: Medicare Other | Source: Ambulatory Visit | Attending: Neurosurgery | Admitting: Neurosurgery

## 2016-05-05 ENCOUNTER — Other Ambulatory Visit (HOSPITAL_COMMUNITY): Payer: Self-pay | Admitting: Neurosurgery

## 2016-05-05 DIAGNOSIS — M4806 Spinal stenosis, lumbar region: Secondary | ICD-10-CM | POA: Insufficient documentation

## 2016-05-05 DIAGNOSIS — M5126 Other intervertebral disc displacement, lumbar region: Secondary | ICD-10-CM | POA: Diagnosis not present

## 2016-05-05 DIAGNOSIS — M5136 Other intervertebral disc degeneration, lumbar region: Secondary | ICD-10-CM | POA: Insufficient documentation

## 2016-05-13 DIAGNOSIS — M5137 Other intervertebral disc degeneration, lumbosacral region: Secondary | ICD-10-CM | POA: Diagnosis not present

## 2016-05-13 DIAGNOSIS — M4316 Spondylolisthesis, lumbar region: Secondary | ICD-10-CM | POA: Diagnosis not present

## 2016-05-14 DIAGNOSIS — N182 Chronic kidney disease, stage 2 (mild): Secondary | ICD-10-CM | POA: Diagnosis not present

## 2016-05-14 DIAGNOSIS — R0602 Shortness of breath: Secondary | ICD-10-CM | POA: Diagnosis not present

## 2016-05-14 DIAGNOSIS — R002 Palpitations: Secondary | ICD-10-CM | POA: Diagnosis not present

## 2016-05-14 DIAGNOSIS — R011 Cardiac murmur, unspecified: Secondary | ICD-10-CM | POA: Diagnosis not present

## 2016-05-14 DIAGNOSIS — I10 Essential (primary) hypertension: Secondary | ICD-10-CM | POA: Diagnosis not present

## 2016-05-14 DIAGNOSIS — E669 Obesity, unspecified: Secondary | ICD-10-CM | POA: Diagnosis not present

## 2016-05-14 DIAGNOSIS — I499 Cardiac arrhythmia, unspecified: Secondary | ICD-10-CM | POA: Diagnosis not present

## 2016-05-14 DIAGNOSIS — K219 Gastro-esophageal reflux disease without esophagitis: Secondary | ICD-10-CM | POA: Diagnosis not present

## 2016-05-14 DIAGNOSIS — I739 Peripheral vascular disease, unspecified: Secondary | ICD-10-CM | POA: Diagnosis not present

## 2016-05-19 ENCOUNTER — Telehealth: Payer: Self-pay | Admitting: *Deleted

## 2016-05-19 MED ORDER — FENTANYL 50 MCG/HR TD PT72: 50.0000 ug | MEDICATED_PATCH | TRANSDERMAL | 0 refills | Status: DC

## 2016-05-19 NOTE — Telephone Encounter (Signed)
Patient going out of town, would like prescription prior to leaving as she will run out while out of town.  Last filled on 04/13/16 #10. Please advise

## 2016-05-19 NOTE — Telephone Encounter (Signed)
Patient will be going of town on 07/28 to visit her sister. She has requested to have a early Rx written for her fentanyl patches to carry with her, due to her patches ending while she's out of time.Please call pt.with response.  A message can be left on the voicemail    Pt contact [phone]

## 2016-05-20 NOTE — Telephone Encounter (Signed)
Notified patient that it is at the front desk for pick up, thanks

## 2016-05-20 NOTE — Telephone Encounter (Signed)
Pt called back to follow up on her Rx. Pt is leaving 07/28 @ 4am. Pt states she needs the medication.   Call pt @ [phone] or cell [phone]. Thank you!

## 2016-05-25 MED ORDER — FENTANYL 50 MCG/HR TD PT72: 50.0000 ug | MEDICATED_PATCH | TRANSDERMAL | 0 refills | Status: DC

## 2016-05-25 NOTE — Telephone Encounter (Signed)
RX FOR 6 PATCHES PRINTED

## 2016-05-25 NOTE — Telephone Encounter (Signed)
Please advise for a prescription for 2 patches, thanks

## 2016-05-25 NOTE — Addendum Note (Signed)
Addended by: Crecencio Mc on: 05/25/2016 10:55 AM   Modules accepted: Orders

## 2016-05-25 NOTE — Telephone Encounter (Signed)
[facility] called regarding needing another Rx for 6 patches due to pharmacy only having 4 to give pt. Being that they filled the Rx and gave pt 4 that was all they had they need a new Rx.   fentaNYL ([place] - DOSED MCG/HR) 50 MCG/HR  Pharmacy is [facility]  - [place]  Thank you!

## 2016-05-26 DIAGNOSIS — R002 Palpitations: Secondary | ICD-10-CM | POA: Diagnosis not present

## 2016-05-27 DIAGNOSIS — I499 Cardiac arrhythmia, unspecified: Secondary | ICD-10-CM | POA: Diagnosis not present

## 2016-05-27 DIAGNOSIS — R002 Palpitations: Secondary | ICD-10-CM | POA: Diagnosis not present

## 2016-05-27 DIAGNOSIS — R011 Cardiac murmur, unspecified: Secondary | ICD-10-CM | POA: Diagnosis not present

## 2016-05-31 ENCOUNTER — Telehealth: Payer: Self-pay | Admitting: Internal Medicine

## 2016-05-31 ENCOUNTER — Telehealth: Payer: Self-pay | Admitting: *Deleted

## 2016-05-31 ENCOUNTER — Emergency Department
Admission: EM | Admit: 2016-05-31 | Discharge: 2016-05-31 | Disposition: A | Discharge status: Home / Self Care | Payer: Medicare Other | Attending: Emergency Medicine | Admitting: Emergency Medicine

## 2016-05-31 DIAGNOSIS — N183 Chronic kidney disease, stage 3 (moderate): Secondary | ICD-10-CM | POA: Diagnosis not present

## 2016-05-31 DIAGNOSIS — I509 Heart failure, unspecified: Secondary | ICD-10-CM | POA: Insufficient documentation

## 2016-05-31 DIAGNOSIS — Z955 Presence of coronary angioplasty implant and graft: Secondary | ICD-10-CM | POA: Insufficient documentation

## 2016-05-31 DIAGNOSIS — Z7982 Long term (current) use of aspirin: Secondary | ICD-10-CM | POA: Diagnosis not present

## 2016-05-31 DIAGNOSIS — I10 Essential (primary) hypertension: Secondary | ICD-10-CM

## 2016-05-31 DIAGNOSIS — I2510 Atherosclerotic heart disease of native coronary artery without angina pectoris: Secondary | ICD-10-CM | POA: Insufficient documentation

## 2016-05-31 DIAGNOSIS — Z87891 Personal history of nicotine dependence: Secondary | ICD-10-CM | POA: Insufficient documentation

## 2016-05-31 DIAGNOSIS — Z79899 Other long term (current) drug therapy: Secondary | ICD-10-CM | POA: Diagnosis not present

## 2016-05-31 DIAGNOSIS — I739 Peripheral vascular disease, unspecified: Secondary | ICD-10-CM | POA: Insufficient documentation

## 2016-05-31 DIAGNOSIS — I130 Hypertensive heart and chronic kidney disease with heart failure and stage 1 through stage 4 chronic kidney disease, or unspecified chronic kidney disease: Secondary | ICD-10-CM | POA: Insufficient documentation

## 2016-05-31 DIAGNOSIS — Z048 Encounter for examination and observation for other specified reasons: Secondary | ICD-10-CM | POA: Diagnosis present

## 2016-05-31 NOTE — Telephone Encounter (Signed)
[facility] Patient Name: [name] DOB: 1941-08-22 Initial Comment Caller says that her BP was 82/55 last night, it is 99/64 now. Feels a bit warm Nurse Assessment Nurse: Dimas Chyle, RN, Dellis Filbert Date/Time Eilene Ghazi Time): 05/31/2016 3:41:35 PM Confirm and document reason for call. If symptomatic, describe symptoms. You must click the next button to save text entered. ---Caller says that her BP was 82/55 last night, it is 99/64 now. Feels a bit warm. Has not taken B/P meds today. No dizziness. Has the patient traveled out of the country within the last 30 days? ---No Does the patient have any new or worsening symptoms? ---Yes Will a triage be completed? ---Yes Related visit to physician within the last 2 weeks? ---No Does the PT have any chronic conditions? (i.e. diabetes, asthma, etc.) ---Yes List chronic conditions. ---HTN Is this a behavioral health or substance abuse call? ---No Guidelines Guideline Title Affirmed Question Affirmed Notes Low Blood Pressure [id number] Systolic BP [id number] AND [id number] taking blood pressure medications AND [3] NOT dizzy, lightheaded or weak Final Disposition User See Physician within [facility], RN, [employer] only wanted to be seen by PCP who had not appointments available for today or tomorrow. Contacted office on backline and they scheduled appointment for caller for 06/03/16 at 2:30 p.m. with PCP. Referrals REFERRED TO PCP OFFICE Disagree/Comply: Comply

## 2016-05-31 NOTE — ED Provider Notes (Signed)
[facility] Emergency Department Provider Note  Time seen: 9:53 PM  I have reviewed the triage vital signs and the nursing notes.   HISTORY  Chief Complaint Hypertension    HPI Pecolia A Bendell is a 75 y.o. female with a past medical history of anxiety, CAD, CK D, hypertension, hyperlipidemia, who presents the emergency department with blood pressure concerns.  Patient states she took her blood pressure earlier today she was 0000000 systolic so she skipped her blood pressure medication. This evening the patient took her lip pressure nose elevated in the 170s to she came to the emergency department for evaluation. Denies any chest pain, headache, weakness or numbness.  Past Medical History:  Diagnosis Date  . Anemia of chronic kidney failure   . Anxiety   . CAD (coronary artery disease)   . Carotid artery stenosis   . Chronic kidney disease, stage III (moderate)    Followed by Dr. Juleen China  . Hyperlipidemia   . Hypertension   . Renal artery stenosis ([place])   . Secondary hyperparathyroidism ([place])   . 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 Subclavian arterial stenosis [facility])     Patient Active Problem List   Diagnosis Date Noted  . Cervicalgia 12/04/2015  . Dizziness and giddiness 12/04/2015  . Cervical spondylosis without myelopathy 08/20/2015  . Recurrent occipital headache 07/20/2015  . Statin intolerance 05/20/2015  . GERD (gastroesophageal reflux disease) 12/06/2014  . Abdominal pain, epigastric 11/14/2014  . Chronic right hip pain 11/14/2014  . Generalized anxiety disorder 09/08/2014  . 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 Peripheral vascular disease ([place]) 08/03/2014  . CAD (coronary artery disease) 08/03/2014  . HLD (hyperlipidemia) 06/24/2014  . Chronic narcotic use 06/24/2014  . Need for prophylactic vaccination and inoculation against influenza 06/24/2014  .  Need for vaccination with 13-polyvalent pneumococcal conjugate vaccine 12/28/2013  . Aortic systolic murmur on examination 12/28/2013  . Hip pain, bilateral 12/27/2013  . Lumbosacral radiculitis 12/27/2013  . HTN (hypertension) 12/27/2013    Past Surgical History:  Procedure Laterality Date  . ABDOMINAL HYSTERECTOMY  1976  . CAROTID ARTERY ANGIOPLASTY Left   . CAROTID ENDARTERECTOMY Left   . CHOLECYSTECTOMY    . CORONARY ANGIOPLASTY WITH STENT PLACEMENT  2000  . CORONARY ARTERY BYPASS GRAFT  2000  . CYSTOSCOPY WITH STENT PLACEMENT Bilateral   . RENAL ARTERY ANGIOPLASTY Bilateral mARCH 2015  . TOE AMPUTATION Right    small toe  . TONSILLECTOMY AND ADENOIDECTOMY    . TOTAL HIP ARTHROPLASTY Left   . TOTAL HIP ARTHROPLASTY Right 2015    Prior to Admission medications   Medication Sig Start Date End Date Taking? Authorizing Provider  amLODipine (NORVASC) 10 MG tablet TAKE ONE TABLET BY MOUTH ONCE DAILY 01/19/16   Crecencio Mc, MD  aspirin 81 MG tablet Take 81 mg by mouth daily.    Historical Provider, MD  baclofen (LIORESAL) 10 MG tablet TAKE ONE TABLET BY MOUTH THREE TIMES DAILY AS NEEDED FOR MUSCLE SPASM 04/19/16   Crecencio Mc, MD  chlordiazePOXIDE (LIBRIUM) 25 MG capsule TAKE ONE CAPSULE BY MOUTH ONCE DAILY AS NEEDED FOR ANXIETY 10/28/15   Crecencio Mc, MD  clopidogrel (PLAVIX) 75 MG tablet Take 1 tablet (75 mg total) by mouth once. 01/19/16   Crecencio Mc, MD  fentaNYL (DURAGESIC - DOSED MCG/HR) 50 MCG/HR Place 1 patch (50 mcg total) onto the skin every 3 (three)  days. 05/25/16   Crecencio Mc, MD  hydrALAZINE (APRESOLINE) 50 MG tablet TAKE ONE TABLET BY MOUTH THREE TIMES DAILY AS NEEDED 03/31/16   Crecencio Mc, MD  labetalol (NORMODYNE) 200 MG tablet Take 0.5 tablets (100 mg total) by mouth 2 (two) times daily. 01/19/16   Crecencio Mc, MD  lisinopril (PRINIVIL,ZESTRIL) 40 MG tablet Take 1 tablet (40 mg total) by mouth daily. Patient taking differently: Take 20 mg by mouth 2  (two) times daily.  03/05/16   Crecencio Mc, MD  NEXIUM 40 MG capsule TAKE ONE CAPSULE BY MOUTH ONCE DAILY 03/23/16   Crecencio Mc, MD  Omega-3 Fatty Acids (FISH OIL) 1000 MG CAPS Take 1 capsule by mouth daily.    Historical Provider, MD  omeprazole (PRILOSEC) 40 MG capsule Take 1 capsule (40 mg total) by mouth daily. 12/17/15   Crecencio Mc, MD  predniSONE (DELTASONE) 10 MG tablet 6 tablets on Day 1 , then reduce by 1 tablet daily until gone 03/05/16   Crecencio Mc, MD  traMADol (ULTRAM) 50 MG tablet TAKE TWO TABLETS BY MOUTH EVERY 8 HOURS AS NEEDED 03/31/16   Crecencio Mc, MD  ZETIA 10 MG tablet TAKE ONE TABLET BY MOUTH ONCE DAILY 01/08/16   Crecencio Mc, MD  ZETIA 10 MG tablet Take 1 tablet (10 mg total) by mouth daily. 04/13/16   Crecencio Mc, MD    Allergies  Allergen Reactions  . Citalopram     Throat closing   . Dilaudid [Hydromorphone Hcl] Nausea And Vomiting  . Hydrochlorothiazide Other (See Comments)    Decreased GFR (Nov 2015)  . Nsaids     CKD stage III - avoid nephrotoxic drugs  . Nubain [Nalbuphine Hcl]     Burning sensation in back  . Penicillins Itching  . Statins Itching    Family History  Problem Relation Age of Onset  . Stroke Mother   . Hypertension Mother   . Diabetes Mother   . Hypertension Father   . Heart disease Sister     MI  . Multiple sclerosis Daughter   . Multiple sclerosis Son   . Cerebral aneurysm Son   . Seizures Son   . Cerebral aneurysm Son     Social History Social History  Substance Use Topics  . Smoking status: Former Smoker    Quit date: 10/25/1998  . Smokeless tobacco: Never Used  . Alcohol use No    Review of Systems Constitutional: Negative for fever. Cardiovascular: Negative for chest pain. Respiratory: Negative for shortness of breath. Gastrointestinal: Negative for abdominal pain Musculoskeletal: Negative for back pain. Neurological: Negative for headaches, focal weakness or numbness. 10-point ROS otherwise  negative.  ____________________________________________   PHYSICAL EXAM:  VITAL SIGNS: ED Triage Vitals  Enc Vitals Group     BP 05/31/16 1931 (!) 184/87     Pulse Rate 05/31/16 1931 98     Resp 05/31/16 1931 18     Temp 05/31/16 1931 98.9 F (37.2 C)     Temp Source 05/31/16 1931 Oral     SpO2 05/31/16 1931 100 %     Weight 05/31/16 1931 162 lb (73.5 kg)     Height 05/31/16 1931 5\' 1"  (1.549 m)     Head Circumference --      Peak Flow --      Pain Score 05/31/16 2106 0     Pain Loc --      Pain Edu? --  Excl. in [place]? --     Constitutional: Alert and oriented. Well appearing and in no distress. Eyes: Normal exam ENT   Head: Normocephalic and atraumatic   Mouth/Throat: Mucous membranes are moist. Cardiovascular: Normal rate, regular rhythm. No murmur Respiratory: Normal respiratory effort without tachypnea nor retractions. Breath sounds are clear Gastrointestinal: Soft and nontender. No distention.   Musculoskeletal: Nontender with normal range of motion in all extremities. Neurologic:  Normal speech and language. No gross focal neurologic deficits are appreciated. Skin:  Skin is warm, dry and intact.  Psychiatric: Mood and affect are normal. Speech and behavior are normal.   ____________________________________________    INITIAL IMPRESSION / ASSESSMENT AND PLAN / ED COURSE  Pertinent labs & imaging results that were available during my care of the patient were reviewed by me and considered in my medical decision making (see chart for details).  [place] well-appearing patient who presents for a spike in blood pressure to the 170s. Patient took half a tablet of lisinopril prior to coming to the emergency department. Currently blood pressure is 123XX123 systolic. I discussed with the patient her blood pressure, and recommendation to check it every few days and said multiple times per day as it can fluctuate throughout the day normally. Discussed PCP follow-up. I  offered to check blood, patient states she would prefer to avoid lab draw. As the patient was asymptomatic continues to appear very well with a normal physical exam and do not believe blood work is required. We'll discharge the patient have her follow-up with her PCP. The patient is agreeable.  ____________________________________________   FINAL CLINICAL IMPRESSION(S) / ED DIAGNOSES  Hypertension    Harvest Dark, MD 05/31/16 2157

## 2016-05-31 NOTE — Telephone Encounter (Signed)
Spoke with patient about BP running low. Patient stated that she is not having any chest pain, SOB, dizziness, or other cardiac symptoms. I scheduled her to see Dr. Caryl Bis tomorrow at 10:30 AM. She is also going to call Dr. Clayborn Bigness office due to them doing an Echo on her last week and she has had no results yet. She was going to see if they can get her in to.

## 2016-05-31 NOTE — ED Triage Notes (Addendum)
Pt to triage via w/c with no distress noted; Pt st BP at noon was 99/66 and did not take her lisinopril; then this evening with elevated and she took 1/2tab; pt denies any c/o and desires no protocols to be done until eval by physician

## 2016-06-01 ENCOUNTER — Ambulatory Visit (INDEPENDENT_AMBULATORY_CARE_PROVIDER_SITE_OTHER): Payer: Medicare Other | Admitting: Family Medicine

## 2016-06-01 ENCOUNTER — Encounter: Payer: Self-pay | Admitting: Family Medicine

## 2016-06-01 VITALS — BP 150/92 | HR 69 | Temp 98.2°F | Wt 159.8 lb

## 2016-06-01 DIAGNOSIS — F411 Generalized anxiety disorder: Secondary | ICD-10-CM | POA: Diagnosis not present

## 2016-06-01 DIAGNOSIS — R002 Palpitations: Secondary | ICD-10-CM

## 2016-06-01 DIAGNOSIS — I10 Essential (primary) hypertension: Secondary | ICD-10-CM | POA: Diagnosis not present

## 2016-06-01 LAB — COMPREHENSIVE METABOLIC PANEL
ALT: 11 U/L (ref 0–35)
AST: 16 U/L (ref 0–37)
Albumin: 4.2 g/dL (ref 3.5–5.2)
Alkaline Phosphatase: 81 U/L (ref 39–117)
BUN: 17 mg/dL (ref 6–23)
CO2: 29 mEq/L (ref 19–32)
Calcium: 9.8 mg/dL (ref 8.4–10.5)
Chloride: 102 mEq/L (ref 96–112)
Creatinine, Ser: 1.09 mg/dL (ref 0.40–1.20)
GFR: 63.05 mL/min (ref >60.00)
Glucose, Bld: 90 mg/dL (ref 70–99)
Potassium: 4.4 mEq/L (ref 3.5–5.1)
Sodium: 138 mEq/L (ref 135–145)
Total Bilirubin: 0.6 mg/dL (ref 0.2–1.2)
Total Protein: 7.5 g/dL (ref 6.0–8.3)

## 2016-06-01 LAB — CBC
HCT: 35.1 % — ABNORMAL LOW (ref 36.0–46.0)
Hemoglobin: 11.8 g/dL — ABNORMAL LOW (ref 12.0–15.0)
MCHC: 33.8 g/dL (ref 30.0–36.0)
MCV: 85.6 fl (ref 78.0–100.0)
Platelets: 258 10*3/uL (ref 150.0–400.0)
RBC: 4.1 Mil/uL (ref 3.87–5.11)
RDW: 13.7 % (ref 11.5–15.5)
WBC: 5.3 10*3/uL (ref 4.0–10.5)

## 2016-06-01 LAB — TSH: TSH: 4.64 u[IU]/mL — ABNORMAL HIGH (ref 0.35–4.50)

## 2016-06-01 NOTE — Patient Instructions (Signed)
Nice to see you. We're going to obtain some lab work. We will call with the results. Please continue your current blood pressure medication regimen. I would ask you to check your blood pressure once a day. Your goal is less than 140/90. If your blood pressure is persistently greater than this please let us know. If it is persistently less than 90/60 please let us know. If your blood pressure ever rises greater than 180/110 please let us know immediately. If you develop chest pain, shortness of breath, palpitations, lightheadedness, headaches, swelling, or any new or changing symptoms please seek medical attention.

## 2016-06-01 NOTE — Assessment & Plan Note (Signed)
Continues to have some anxiety issues surrounding recent illnesses in relatives. She notes she takes Librium as needed for anxiety though rarely takes this. She notes this helps her anxiety and does not make her drowsy. She did take some today while in the office. Discussed that she should limit her use of this medication and if she becomes drowsy she should not take it anymore. She would likely benefit from an SSRI for this though it appears that she has not been interested in this in the past.

## 2016-06-01 NOTE — Assessment & Plan Note (Signed)
Blood pressures wildly variable yesterday. It appears that most of her pressures in the past and uncontrolled. She was asymptomatic with her pressures in the 123XX123 to 0000000 systolic. Blood pressure slightly elevated today. Discussed that there is no need to check her blood pressure every 2 hours if she was not having any symptoms. Advised to check once daily and provided parameters in the AVS. Given her report of palpitations I offered and EKG though she declined this. She was willing to try to obtain blood work to rule out other causes for palpitations. She'll continue her current blood pressure medication regimen. She'll check once daily and return next week for follow-up with her PCP and blood pressure check. She is given return precautions.

## 2016-06-01 NOTE — Progress Notes (Signed)
Pre visit review using our clinic review tool, if applicable. No additional management support is needed unless otherwise documented below in the visit note. 

## 2016-06-01 NOTE — Assessment & Plan Note (Signed)
Reports recent palpitations. Has been evaluated by cardiology and reports normal Holter monitor. No Holter report found in care everywhere. Echo report found and appears reassuring. Discussed obtaining an EKG as there is no EKG from recently that is visible though she declined this. She opted for obtaining some lab work to evaluate other causes and this is ordered as outlined below. Symptoms could be related to anxiety as well. She's given return precautions.

## 2016-06-01 NOTE — Progress Notes (Signed)
Sabrina Rumps, MD Phone: [phone]  Siobhan A Delpino is a 75 y.o. female who presents today for same-day visit.  Patient notes her blood pressures have been widely variable recently. Yesterday had blood pressures between 82-99/55-64. Notes she had not taken her blood pressure medicines yesterday morning when this occurred. She subsequently in the evening checked her blood pressure again and it was 171/84. She states she typically checks her blood pressure every 2 hours. She went to the emergency room when this occurred. Per review of the note it appears that the patient did not want anything done while there. Her blood pressure was in the 828M systolic when they checked it. She is currently on lisinopril 20 mg twice daily, labetalol half a tablet twice daily, hydralazine if blood pressure greater than 034 systolic, and Norvasc. She did skip a dose of her lisinopril this morning. She denies chest pain, shortness breath, and swelling with this. No lightheadedness. She did note some brief palpitations last night. She has been evaluated by cardiology for palpitations in the last month. Had an echo that was reassuring. She reports she had a Holter monitor that she states is normal. There is no apparent Holter monitor report in care everywhere. She thinks some of this may be related to anxiety as she has a relative on life support and a friend it's in the hospital. She notes she has Librium that she takes for anxiety and wants to take 1 today to help with this. She rarely takes these per her report. She notes this does not typically make her drowsy.  PMH: Former smoker   ROS see history of present illness  Objective  Physical Exam Vitals:   06/01/16 1016 06/01/16 1032  BP: (!) 158/102 (!) 150/92  Pulse: 69   Temp: 98.2 F (36.8 C)     BP Readings from Last 3 Encounters:  06/01/16 (!) 150/92  05/31/16 (!) 164/83  04/13/16 (!) 162/66   Wt Readings from Last 3 Encounters:  06/01/16 159  lb 12.8 oz (72.5 kg)  05/31/16 162 lb (73.5 kg)  03/05/16 160 lb (72.6 kg)    Physical Exam  Constitutional: No distress.  HENT:  Head: Normocephalic and atraumatic.  Mouth/Throat: Oropharynx is clear and moist. No oropharyngeal exudate.  Eyes: Conjunctivae are normal. Pupils are equal, round, and reactive to light.  Cardiovascular: Normal rate, regular rhythm and normal heart sounds.   Pulmonary/Chest: Effort normal and breath sounds normal.  Neurological: She is alert. Gait normal.  Skin: Skin is warm and dry. She is not diaphoretic.  Psychiatric:  Mood anxious, affect anxious     Assessment/Plan: Please see individual problem list.  HTN (hypertension) Blood pressures wildly variable yesterday. It appears that most of her pressures in the past and uncontrolled. She was asymptomatic with her pressures in the 91P to 91T systolic. Blood pressure slightly elevated today. Discussed that there is no need to check her blood pressure every 2 hours if she was not having any symptoms. Advised to check once daily and provided parameters in the AVS. Given her report of palpitations I offered and EKG though she declined this. She was willing to try to obtain blood work to rule out other causes for palpitations. She'll continue her current blood pressure medication regimen. She'll check once daily and return next week for follow-up with her PCP and blood pressure check. She is given return precautions.  Generalized anxiety disorder Continues to have some anxiety issues surrounding recent illnesses in relatives. She notes she  takes Librium as needed for anxiety though rarely takes this. She notes this helps her anxiety and does not make her drowsy. She did take some today while in the office. Discussed that she should limit her use of this medication and if she becomes drowsy she should not take it anymore. She would likely benefit from an SSRI for this though it appears that she has not been interested  in this in the past.  Palpitations Reports recent palpitations. Has been evaluated by cardiology and reports normal Holter monitor. No Holter report found in care everywhere. Echo report found and appears reassuring. Discussed obtaining an EKG as there is no EKG from recently that is visible though she declined this. She opted for obtaining some lab work to evaluate other causes and this is ordered as outlined below. Symptoms could be related to anxiety as well. She's given return precautions.   Orders Placed This Encounter  Procedures  . Comp Met (CMET)  . CBC  . TSH    Sabrina Rumps, MD [place]

## 2016-06-03 ENCOUNTER — Ambulatory Visit: Payer: Medicare Other | Admitting: Internal Medicine

## 2016-06-07 ENCOUNTER — Ambulatory Visit: Payer: Medicare Other | Admitting: Internal Medicine

## 2016-06-09 ENCOUNTER — Ambulatory Visit (INDEPENDENT_AMBULATORY_CARE_PROVIDER_SITE_OTHER): Payer: Medicare Other | Admitting: Internal Medicine

## 2016-06-09 VITALS — BP 158/78 | HR 67 | Temp 98.3°F | Resp 12 | Ht 61.0 in | Wt 159.2 lb

## 2016-06-09 DIAGNOSIS — F411 Generalized anxiety disorder: Secondary | ICD-10-CM | POA: Diagnosis not present

## 2016-06-09 DIAGNOSIS — I10 Essential (primary) hypertension: Secondary | ICD-10-CM

## 2016-06-09 DIAGNOSIS — F1190 Opioid use, unspecified, uncomplicated: Secondary | ICD-10-CM

## 2016-06-09 NOTE — Progress Notes (Signed)
Pre-visit discussion using our clinic review tool. No additional management support is needed unless otherwise documented below in the visit note.  

## 2016-06-09 NOTE — Progress Notes (Signed)
Subjective:  Patient ID: Sabrina [name], female    DOB: 1941/02/23  Age: 75 y.o. MRN: [id number]  CC: The primary encounter diagnosis was Labile essential hypertension. Diagnoses of Chronic narcotic use, Generalized anxiety disorder, and Essential hypertension were also pertinent to this visit.  HPI Sabrina [name] presents for follow up on labile hypertension , obesity and anxiety.   episodes of extremely high readings aggravated by anxiety.  Seen last week by Tommi Rumps,  Anxiety identified,   No changes made,  Advised to limit BP checks to once daily and use librium sparingly   Has been under a lot of emotional stress due to sister's declining health and prolonged hospitalization  Family stressors:  Sister dying in hospital in [place], son has MS    Regimen reviewed: Lisinopril 1/2 tablet (20 mg ) am 3 times daily' Labetaolol 1/.2 bid  Amlodipine 10 mg  q pm   Using baclofen in the evening .  Using librium when feels really tense   Discussed anxiety Prior trial of zoloft prescribed by  Dr Brynda Greathouse was not tolerated    GERD : controlled with Nexium too $$$$  Obesity:  Rice Krispies,  Toast 3 times daily.  Dinner is chicken , rice , glazed carrots. . Does not walk due to back, hip and  leg pain . 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  does not want to see a Pain clinic doctor.  Tried adding Aleve and it really helped   Outpatient Medications Prior to Visit  Medication Sig Dispense Refill  . amLODipine (NORVASC) 10 MG tablet TAKE ONE TABLET BY MOUTH ONCE DAILY 90 tablet 1  . aspirin 81 MG tablet Take 81 mg by mouth daily.    . baclofen (LIORESAL) 10 MG tablet TAKE ONE TABLET BY MOUTH THREE TIMES DAILY AS NEEDED FOR MUSCLE SPASM 90 tablet 1  . chlordiazePOXIDE (LIBRIUM) 25 MG capsule TAKE ONE CAPSULE BY MOUTH ONCE DAILY AS NEEDED FOR ANXIETY 30 capsule 5  . clopidogrel (PLAVIX) 75 MG tablet Take 1 tablet (75 mg total) by mouth once. 90 tablet 2  . fentaNYL  (DURAGESIC - DOSED MCG/HR) 50 MCG/HR Place 1 patch (50 mcg total) onto the skin every 3 (three) days. 6 patch 0  . hydrALAZINE (APRESOLINE) 50 MG tablet TAKE ONE TABLET BY MOUTH THREE TIMES DAILY AS NEEDED 270 tablet 1  . labetalol (NORMODYNE) 200 MG tablet Take 0.5 tablets (100 mg total) by mouth 2 (two) times daily. 90 tablet 2  . lisinopril (PRINIVIL,ZESTRIL) 40 MG tablet Take 1 tablet (40 mg total) by mouth daily. (Patient taking differently: Take 20 mg by mouth 2 (two) times daily. ) 90 tablet 1  . NEXIUM 40 MG capsule TAKE ONE CAPSULE BY MOUTH ONCE DAILY 90 capsule 3  . Omega-3 Fatty Acids (FISH OIL) 1000 MG CAPS Take 1 capsule by mouth daily.    . traMADol (ULTRAM) 50 MG tablet TAKE TWO TABLETS BY MOUTH EVERY 8 HOURS AS NEEDED 180 tablet 3  . ZETIA 10 MG tablet TAKE ONE TABLET BY MOUTH ONCE DAILY 90 tablet 0  . ZETIA 10 MG tablet Take 1 tablet (10 mg total) by mouth daily. 90 tablet 1  . omeprazole (PRILOSEC) 40 MG capsule Take 1 capsule (40 mg total) by mouth daily. (Patient not taking: Reported on 06/09/2016) 30 capsule 3  . predniSONE (DELTASONE) 10 MG tablet 6 tablets on Day 1 , then reduce by 1 tablet daily until gone (Patient  not taking: Reported on 06/09/2016) 21 tablet 0   No facility-administered medications prior to visit.     Review of Systems;  Patient denies headache, fevers, malaise, unintentional weight loss, skin rash, eye pain, sinus congestion and sinus pain, sore throat, dysphagia,  hemoptysis , cough, dyspnea, wheezing, chest pain, palpitations, orthopnea, edema, abdominal pain, nausea, melena, diarrhea, constipation, flank pain, dysuria, hematuria, urinary  Frequency, nocturia, numbness, tingling, seizures,  Focal weakness, Loss of consciousness,  Tremor, insomnia, depression, anxiety, and suicidal ideation.      Objective:  BP (!) 158/78   Pulse 67   Temp 98.3 F (36.8 C) (Oral)   Resp 12   Ht 5\' 1"  (1.549 m)   Wt 159 lb 4 oz (72.2 kg)   SpO2 99%   BMI 30.09  kg/m   BP Readings from Last 3 Encounters:  06/09/16 (!) 158/78  06/01/16 (!) 150/92  05/31/16 (!) 164/83    Wt Readings from Last 3 Encounters:  06/09/16 159 lb 4 oz (72.2 kg)  06/01/16 159 lb 12.8 oz (72.5 kg)  05/31/16 162 lb (73.5 kg)    General appearance: alert, cooperative and appears stated age Ears: normal TM's and external ear canals both ears Throat: lips, mucosa, and tongue normal; teeth and gums normal Neck: no adenopathy, no carotid bruit, supple, symmetrical, trachea midline and thyroid not enlarged, symmetric, no tenderness/mass/nodules Back: symmetric, no curvature. ROM normal. No CVA tenderness. Lungs: clear to auscultation bilaterally Heart: regular rate and rhythm, S1, S2 normal, no murmur, click, rub or gallop Abdomen: soft, non-tender; bowel sounds normal; no masses,  no organomegaly Pulses: 2+ and symmetric Skin: Skin color, texture, turgor normal. No rashes or lesions Lymph nodes: Cervical, supraclavicular, and axillary nodes normal.  Lab Results  Component Value Date   HGBA1C 5.8 06/02/2015    Lab Results  Component Value Date   CREATININE 1.09 06/01/2016   CREATININE 1.02 03/05/2016   CREATININE 1.08 06/02/2015    Lab Results  Component Value Date   WBC 5.3 06/01/2016   HGB 11.8 (L) 06/01/2016   HCT 35.1 (L) 06/01/2016   PLT 258.0 06/01/2016   GLUCOSE 90 06/01/2016   CHOL 219 (H) 06/02/2015   TRIG 83.0 06/02/2015   HDL 54.20 06/02/2015   LDLDIRECT 109.0 03/05/2016   LDLCALC 148 (H) 06/02/2015   ALT 11 06/01/2016   AST 16 06/01/2016   NA 138 06/01/2016   K 4.4 06/01/2016   CL 102 06/01/2016   CREATININE 1.09 06/01/2016   BUN 17 06/01/2016   CO2 29 06/01/2016   TSH 4.64 (H) 06/01/2016   INR 1.0 03/27/2014   HGBA1C 5.8 06/02/2015    No results found.  Assessment & Plan:   Problem List Items Addressed This Visit    HTN (hypertension)    Her recurrent episodes of labile hypertension are complicated by her checking her BP  several times daily.  No changes today; ordering a 24 hour BP monitor study       Chronic narcotic use    Secondary to lumbar radiculitis  Adding Alevel ,  Continue Fentanyl and baclofen       Generalized anxiety disorder    She continues to defer SSRI therapy .  Continue Librium prn not more than once daily        Other Visit Diagnoses    Labile essential hypertension    -  Primary   Relevant Orders   Ambulatory referral to Cardiology     A total of 25 minutes  of face to face time was spent with patient more than half of which was spent in counselling about the above mentioned conditions  and coordination of care  I have discontinued Ms. Barrientes's omeprazole and predniSONE. I am also having her maintain her aspirin, Fish Oil, chlordiazePOXIDE, ZETIA, amLODipine, clopidogrel, labetalol, lisinopril, NEXIUM, traMADol, hydrALAZINE, ZETIA, baclofen, and fentaNYL.  No orders of the defined types were placed in this encounter.   Medications Discontinued During This Encounter  Medication Reason  . predniSONE (DELTASONE) 10 MG tablet Completed Course  . omeprazole (PRILOSEC) 40 MG capsule Change in therapy    Follow-up: No Follow-up on file.   Crecencio Mc, MD

## 2016-06-09 NOTE — Patient Instructions (Addendum)
You can try  Taking one Aleve daily for back pain   I am not changing your BP medication today . I am ordering a 24 hour blood pressure monitor to assess your control.   Your thyroid does not need to be treated unless you start having symptoms of  underactive thyroid

## 2016-06-12 NOTE — Assessment & Plan Note (Signed)
She continues to defer SSRI therapy .  Continue Librium prn not more than once daily  

## 2016-06-12 NOTE — Assessment & Plan Note (Signed)
Her recurrent episodes of labile hypertension are complicated by her checking her BP several times daily.  No changes today; ordering a 24 hour BP monitor study

## 2016-06-12 NOTE — Assessment & Plan Note (Signed)
Secondary to lumbar radiculitis  Adding Alevel ,  Continue Fentanyl and baclofen

## 2016-06-22 ENCOUNTER — Other Ambulatory Visit: Payer: Self-pay

## 2016-06-22 ENCOUNTER — Telehealth: Payer: Self-pay | Admitting: Internal Medicine

## 2016-06-22 NOTE — Telephone Encounter (Signed)
Patient was confused she did not realize that she had to see cardiology to get the monitor for BP evaluation. Patient is willing to go. Have forwarded also to referrals.

## 2016-06-22 NOTE — Telephone Encounter (Signed)
I received a message from [facility] that patient refused the cardiology referral for the 24 hour blood pressure monitor that I ordered.  Since most of her office visits are due to elevated blood pressure readings,  I 'm disappointed that she refused this evaluation and would like her to reconsider.

## 2016-06-30 DIAGNOSIS — I10 Essential (primary) hypertension: Secondary | ICD-10-CM | POA: Diagnosis not present

## 2016-07-01 ENCOUNTER — Other Ambulatory Visit: Payer: Self-pay | Admitting: Internal Medicine

## 2016-07-01 MED ORDER — FENTANYL 50 MCG/HR TD PT72: 50.0000 ug | MEDICATED_PATCH | TRANSDERMAL | 0 refills | Status: DC

## 2016-07-01 NOTE — Telephone Encounter (Signed)
Refilled for 90 days (3 30 days r's)

## 2016-07-01 NOTE — Telephone Encounter (Signed)
Please advise for refill, last one was 05/25/16 for 6 patches.

## 2016-07-01 NOTE — Telephone Encounter (Signed)
Pt is requesting a refill on her fentaNYL (DURAGESIC - DOSED MCG/HR) 50 MCG/HR

## 2016-07-09 ENCOUNTER — Ambulatory Visit (INDEPENDENT_AMBULATORY_CARE_PROVIDER_SITE_OTHER): Payer: Medicare Other | Admitting: Internal Medicine

## 2016-07-09 ENCOUNTER — Telehealth: Payer: Self-pay | Admitting: *Deleted

## 2016-07-09 ENCOUNTER — Encounter: Payer: Self-pay | Admitting: Internal Medicine

## 2016-07-09 VITALS — BP 142/82 | HR 74 | Temp 98.7°F | Resp 16 | Ht 61.0 in | Wt 163.0 lb

## 2016-07-09 DIAGNOSIS — R7301 Impaired fasting glucose: Secondary | ICD-10-CM

## 2016-07-09 DIAGNOSIS — I10 Essential (primary) hypertension: Secondary | ICD-10-CM

## 2016-07-09 DIAGNOSIS — E785 Hyperlipidemia, unspecified: Secondary | ICD-10-CM

## 2016-07-09 DIAGNOSIS — R002 Palpitations: Secondary | ICD-10-CM

## 2016-07-09 DIAGNOSIS — M5416 Radiculopathy, lumbar region: Secondary | ICD-10-CM

## 2016-07-09 DIAGNOSIS — M5417 Radiculopathy, lumbosacral region: Secondary | ICD-10-CM

## 2016-07-09 DIAGNOSIS — M47812 Spondylosis without myelopathy or radiculopathy, cervical region: Secondary | ICD-10-CM

## 2016-07-09 DIAGNOSIS — R7989 Other specified abnormal findings of blood chemistry: Secondary | ICD-10-CM

## 2016-07-09 DIAGNOSIS — R51 Headache: Secondary | ICD-10-CM

## 2016-07-09 DIAGNOSIS — R519 Headache, unspecified: Secondary | ICD-10-CM

## 2016-07-09 MED ORDER — PREDNISONE 10 MG PO TABS: ORAL_TABLET | ORAL | 0 refills | Status: DC

## 2016-07-09 MED ORDER — CHLORDIAZEPOXIDE HCL 25 MG PO CAPS: ORAL_CAPSULE | ORAL | 5 refills | Status: DC

## 2016-07-09 MED ORDER — FENTANYL 25 MCG/HR TD PT72: 25.0000 ug | MEDICATED_PATCH | TRANSDERMAL | 0 refills | Status: DC

## 2016-07-09 NOTE — Telephone Encounter (Signed)
Please advise 

## 2016-07-09 NOTE — Telephone Encounter (Signed)
4:30 today is all I Have

## 2016-07-09 NOTE — Patient Instructions (Addendum)
Return in 2 weeks for fasting labs  And flu vaccine   We are adding 25 mcg fentanyl to help you get your pain under control  Adding a 6 day tapering dose of prednisone to manage pain and inflammation (take the dose in the morning)   KATHY SAYS TO DRINK LOTS OF WATER THE MORNING YOU COME BACK FOR LABS~!!!!!

## 2016-07-09 NOTE — Progress Notes (Signed)
Subjective:  Patient ID: Sabrina [name], female    DOB: [birth year]  Age: 75 y.o. MRN: [id number]  CC: The primary encounter diagnosis was Lumbosacral radiculitis. Diagnoses of Cervical spondylosis without myelopathy, Abnormal TSH, Hyperlipidemia, Impaired fasting glucose, Recurrent occipital headache, Essential hypertension, and Palpitations were also pertinent to this visit.  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  Periodic,  Still feels the heart skips.   She was referred to Neurosurgery  in October after MRI was done Saw Dr Weston Settle several times but no therapy was offered so she has stopped going back      Fentanyl not helping,   She is in pain all the time.  Discussed Pain Clinic   Outpatient Medications Prior to Visit  Medication Sig Dispense Refill  . amLODipine (NORVASC) 10 MG tablet TAKE ONE TABLET BY MOUTH ONCE DAILY 90 tablet 1  . aspirin 81 MG tablet Take 81 mg by mouth daily.    . baclofen (LIORESAL) 10 MG tablet TAKE ONE TABLET BY MOUTH THREE TIMES DAILY AS NEEDED FOR MUSCLE SPASM 90 tablet 1  . clopidogrel (PLAVIX) 75 MG tablet Take 1 tablet (75 mg total) by mouth once. 90 tablet 2  . fentaNYL (DURAGESIC - DOSED MCG/HR) 50 MCG/HR Place 1 patch (50 mcg total) onto the skin every 3 (three) days. 10 patch 0  . hydrALAZINE (APRESOLINE) 50 MG tablet TAKE ONE TABLET BY MOUTH THREE TIMES DAILY AS NEEDED 270 tablet 1  . labetalol (NORMODYNE) 200 MG tablet Take 0.5 tablets (100 mg total) by mouth 2 (two) times daily. 90 tablet 2  . lisinopril (PRINIVIL,ZESTRIL) 40 MG tablet Take 1 tablet (40 mg total) by mouth daily. (Patient taking differently: Take 20 mg by mouth 2 (two) times daily. ) 90 tablet 1  . NEXIUM 40 MG capsule TAKE ONE CAPSULE BY  MOUTH ONCE DAILY 90 capsule 3  . Omega-3 Fatty Acids (FISH OIL) 1000 MG CAPS Take 1 capsule by mouth daily.    . traMADol (ULTRAM) 50 MG tablet TAKE TWO TABLETS BY MOUTH EVERY 8 HOURS AS NEEDED 180 tablet 3  . ZETIA 10 MG tablet TAKE ONE TABLET BY MOUTH ONCE DAILY 90 tablet 0  . chlordiazePOXIDE (LIBRIUM) 25 MG capsule TAKE ONE CAPSULE BY MOUTH ONCE DAILY AS NEEDED FOR ANXIETY 30 capsule 5  . ZETIA 10 MG tablet Take 1 tablet (10 mg total) by mouth daily. 90 tablet 1   No facility-administered medications prior to visit.     Review of Systems;  Patient denies headache, fevers, malaise, unintentional weight loss, skin rash, eye pain, sinus congestion and sinus pain, sore throat, dysphagia,  hemoptysis , cough, dyspnea, wheezing, chest pain, palpitations, orthopnea, edema, abdominal pain, nausea, melena, diarrhea, constipation, flank pain, dysuria, hematuria, urinary  Frequency, nocturia, numbness, tingling, seizures,  Focal weakness, Loss of consciousness,  Tremor, insomnia, depression, anxiety, and suicidal ideation.      Objective:  BP (!) 142/82 (BP Location: Left Arm, Patient Position: Sitting, Cuff Size: Normal)   Pulse 74   Temp 98.7 F (37.1 C) (Oral)   Resp 16   Ht 5\' 1"  (1.549 m)   Wt 163 lb (73.9 kg)   SpO2 96%   BMI 30.80 kg/m   BP Readings  from Last 3 Encounters:  07/09/16 (!) 142/82  06/09/16 (!) 158/78  06/01/16 (!) 150/92    Wt Readings from Last 3 Encounters:  07/09/16 163 lb (73.9 kg)  06/09/16 159 lb 4 oz (72.2 kg)  06/01/16 159 lb 12.8 oz (72.5 kg)    General appearance: alert, cooperative and appears stated age Ears: normal TM's and external ear canals both ears Throat: lips, mucosa, and tongue normal; teeth and gums normal Neck: no adenopathy, no carotid bruit, supple, symmetrical, trachea midline and thyroid not enlarged, symmetric, no tenderness/mass/nodules Back: symmetric, no curvature. ROM normal. No CVA tenderness. Lungs: clear to auscultation  bilaterally Heart: regular rate and rhythm, S1, S2 normal, no murmur, click, rub or gallop Abdomen: soft, non-tender; bowel sounds normal; no masses,  no organomegaly Pulses: 2+ and symmetric Skin: Skin color, texture, turgor normal. No rashes or lesions Lymph nodes: Cervical, supraclavicular, and axillary nodes normal.  Lab Results  Component Value Date   HGBA1C 5.8 06/02/2015    Lab Results  Component Value Date   CREATININE 1.09 06/01/2016   CREATININE 1.02 03/05/2016   CREATININE 1.08 06/02/2015    Lab Results  Component Value Date   WBC 5.3 06/01/2016   HGB 11.8 (L) 06/01/2016   HCT 35.1 (L) 06/01/2016   PLT 258.0 06/01/2016   GLUCOSE 90 06/01/2016   CHOL 219 (H) 06/02/2015   TRIG 83.0 06/02/2015   HDL 54.20 06/02/2015   LDLDIRECT 109.0 03/05/2016   LDLCALC 148 (H) 06/02/2015   ALT 11 06/01/2016   AST 16 06/01/2016   NA 138 06/01/2016   K 4.4 06/01/2016   CL 102 06/01/2016   CREATININE 1.09 06/01/2016   BUN 17 06/01/2016   CO2 29 06/01/2016   TSH 4.64 (H) 06/01/2016   INR 1.0 03/27/2014   HGBA1C 5.8 06/02/2015    No results found.  Assessment & Plan:   Problem List Items Addressed This Visit    HTN (hypertension)    Well controlled on current regimen. Renal function stable, no changes today..  Recommended she stop measuring it daily , only weekly      Recurrent occipital headache    Secondary to cervical spinal stenosis,  Increased fentanryl transdermal dose to 75 mcg and long discussion with patient today about managment,  She is willing to see Pain Clinic since Neurosurgery offered nothing ( Dr Saintclair Halsted)      Relevant Medications   fentaNYL (DURAGESIC) 25 MCG/HR patch   Cervical spondylosis without myelopathy    Repeating prednisone taper. Pain cllinic referral made       Relevant Medications   predniSONE (DELTASONE) 10 MG tablet   fentaNYL (DURAGESIC) 25 MCG/HR patch   Other Relevant Orders   Ambulatory referral to Pain Clinic   Palpitations     Results of recent 24 Holter monitor were reviewed with patient, since she reports having very few palpitations while wearing the montior and more since stopping it.  PACs were noted.  Reassurance provided to patient that these are harmless      Lumbosacral radiculitis - Primary   Relevant Medications   chlordiazePOXIDE (LIBRIUM) 25 MG capsule   Other Relevant Orders   Ambulatory referral to Pain Clinic    Other Visit Diagnoses    Abnormal TSH       Relevant Orders   TSH   Hyperlipidemia       Relevant Orders   Lipid panel   Impaired fasting glucose       Relevant Orders   Comprehensive  metabolic panel   Hemoglobin A1c    A total of 40 minutes was spent with patient more than half of which was spent in counseling patient on the above mentioned issues , reviewing and explaining recent labs and imaging studies done, and coordination of care.  I am having Ms. Waln start on predniSONE and fentaNYL. I am also having her maintain her aspirin, Fish Oil, ZETIA, amLODipine, clopidogrel, labetalol, lisinopril, NEXIUM, traMADol, hydrALAZINE, baclofen, fentaNYL, and chlordiazePOXIDE.  Meds ordered this encounter  Medications  . predniSONE (DELTASONE) 10 MG tablet    Sig: 6 tablets on Day 1 , then reduce by 1 tablet daily until gone    Dispense:  21 tablet    Refill:  0  . fentaNYL (DURAGESIC) 25 MCG/HR patch    Sig: Place 1 patch (25 mcg total) onto the skin every 3 (three) days. Add to the 50 mcg patch for a total of 75 mcg    Dispense:  7 patch    Refill:  0  . chlordiazePOXIDE (LIBRIUM) 25 MG capsule    Sig: TAKE ONE CAPSULE BY MOUTH ONCE DAILY AS NEEDED FOR ANXIETY    Dispense:  30 capsule    Refill:  5    Medications Discontinued During This Encounter  Medication Reason  . ZETIA 10 MG tablet Duplicate  . chlordiazePOXIDE (LIBRIUM) 25 MG capsule Reorder    Follow-up: Return in about 2 weeks (around 07/23/2016), or fasting labs,  RN visit .   Crecencio Mc, MD

## 2016-07-09 NOTE — Telephone Encounter (Signed)
The patient has been scheduled

## 2016-07-09 NOTE — Telephone Encounter (Signed)
Patient requested a asap appt with Dr.Tullo. She was advised to come in if she had dizziness. Pt stated that she has had dizziness for several days along with a headache. She would like be seen only by Dr. Derrel Nip. Please give a time and date to place pt.

## 2016-07-11 NOTE — Assessment & Plan Note (Signed)
Repeating prednisone taper. Pain cllinic referral made

## 2016-07-11 NOTE — Assessment & Plan Note (Signed)
Secondary to cervical spinal stenosis,  Increased fentanryl transdermal dose to 75 mcg and long discussion with patient today about managment,  She is willing to see Pain Clinic since Neurosurgery offered nothing ( Dr Saintclair Halsted)

## 2016-07-11 NOTE — Assessment & Plan Note (Signed)
Well controlled on current regimen. Renal function stable, no changes today..  Recommended she stop measuring it daily , only weekly

## 2016-07-11 NOTE — Assessment & Plan Note (Signed)
Results of recent 24 Holter monitor were reviewed with patient, since she reports having very few palpitations while wearing the montior and more since stopping it.  PACs were noted.  Reassurance provided to patient that these are harmless

## 2016-07-16 ENCOUNTER — Telehealth: Payer: Self-pay | Admitting: Internal Medicine

## 2016-07-16 ENCOUNTER — Telehealth: Payer: Self-pay | Admitting: *Deleted

## 2016-07-16 NOTE — Telephone Encounter (Signed)
Error

## 2016-07-16 NOTE — Telephone Encounter (Signed)
She does not need to be seen, this is a chronic problem, and she has been referred  For treatmenbt.  She can take 2 tramadol every 8 hours as needed for headache.

## 2016-07-16 NOTE — Telephone Encounter (Signed)
Patient Name: Sabrina [name]  DOB: 04-07-1941    Initial Comment Caller says she feels terrible; she is still dizzy. Had a had a headache all night. Took two Tremadol at 2:30 and an Aleeve t 6:30 this AM. head still hurting. BP is 152/80. Wanted to tell this to Dr Derrel Nip    Nurse Assessment  Nurse: Raphael Gibney, RN, Vanita Ingles Date/Time Eilene Ghazi Time): 07/16/2016 12:38:25 PM  Confirm and document reason for call. If symptomatic, describe symptoms. You must click the next button to save text entered. ---Caller states she has a headache. She is dizzy. Took 2 Tramadol at 2:30 am and Aleve at 6:30 am and she still has a severe headache. Having neck pain. BP 152/80. Just took a hydralazine Pain level 10.  Has the patient traveled out of the country within the last 30 days? ---Not Applicable  Does the patient have any new or worsening symptoms? ---Yes  Will a triage be completed? ---Yes  Related visit to physician within the last 2 weeks? ---Yes  Does the PT have any chronic conditions? (i.e. diabetes, asthma, etc.) ---Yes  List chronic conditions. ---CABG in 2010; HTN  Is this a behavioral health or substance abuse call? ---No     Guidelines    Guideline Title Affirmed Question Affirmed Notes  Headache [1] SEVERE headache (e.g., excruciating) AND [2] not improved after 2 hours of pain medicine    Final Disposition User   See Physician within 4 Hours (or PCP triage) Raphael Gibney, RN, Vera    Comments  No appts available at [facility] within 4 hrs. Pt does not want to go to urgent care or to another office. Please call pt back regarding appt.   Referrals  GO TO FACILITY REFUSED   Disagree/Comply: Disagree  Disagree/Comply Reason: Disagree with instructions

## 2016-07-16 NOTE — Telephone Encounter (Signed)
FYI

## 2016-07-16 NOTE — Telephone Encounter (Signed)
I transferred the patient to the triage line she has a headache that is not relieved by any pain killer and her BP is slightly elevated. She requested that her lab appointment be moved to Monday 9.25.17 @ 8:00.

## 2016-07-16 NOTE — Telephone Encounter (Signed)
Notified pt. 

## 2016-07-19 ENCOUNTER — Other Ambulatory Visit (INDEPENDENT_AMBULATORY_CARE_PROVIDER_SITE_OTHER): Payer: Medicare Other

## 2016-07-19 ENCOUNTER — Telehealth: Payer: Self-pay | Admitting: Internal Medicine

## 2016-07-19 DIAGNOSIS — R7989 Other specified abnormal findings of blood chemistry: Secondary | ICD-10-CM

## 2016-07-19 DIAGNOSIS — E785 Hyperlipidemia, unspecified: Secondary | ICD-10-CM | POA: Diagnosis not present

## 2016-07-19 DIAGNOSIS — R7301 Impaired fasting glucose: Secondary | ICD-10-CM

## 2016-07-19 DIAGNOSIS — R42 Dizziness and giddiness: Secondary | ICD-10-CM

## 2016-07-19 LAB — COMPREHENSIVE METABOLIC PANEL
ALT: 11 U/L (ref 0–35)
AST: 14 U/L (ref 0–37)
Albumin: 3.6 g/dL (ref 3.5–5.2)
Alkaline Phosphatase: 70 U/L (ref 39–117)
BUN: 16 mg/dL (ref 6–23)
CO2: 33 mEq/L — ABNORMAL HIGH (ref 19–32)
Calcium: 8.8 mg/dL (ref 8.4–10.5)
Chloride: 100 mEq/L (ref 96–112)
Creatinine, Ser: 1.15 mg/dL (ref 0.40–1.20)
GFR: 59.25 mL/min — ABNORMAL LOW (ref >60.00)
Glucose, Bld: 93 mg/dL (ref 70–99)
Potassium: 4.8 mEq/L (ref 3.5–5.1)
Sodium: 138 mEq/L (ref 135–145)
Total Bilirubin: 0.5 mg/dL (ref 0.2–1.2)
Total Protein: 6.7 g/dL (ref 6.0–8.3)

## 2016-07-19 LAB — HEMOGLOBIN A1C: Hgb A1c MFr Bld: 5.9 % (ref 4.6–6.5)

## 2016-07-19 LAB — LIPID PANEL
Cholesterol: 167 mg/dL (ref 0–200)
HDL: 57.9 mg/dL (ref >39.00)
LDL Cholesterol: 98 mg/dL (ref 0–99)
NonHDL: 108.98
Total CHOL/HDL Ratio: 3
Triglycerides: 55 mg/dL (ref 0.0–149.0)
VLDL: 11 mg/dL (ref 0.0–40.0)

## 2016-07-19 LAB — TSH: TSH: 6.59 u[IU]/mL — ABNORMAL HIGH (ref 0.35–4.50)

## 2016-07-19 NOTE — Telephone Encounter (Deleted)
error 

## 2016-07-19 NOTE — Addendum Note (Signed)
Addended by: Crecencio Mc on: 07/19/2016 10:21 AM   Modules accepted: Orders

## 2016-07-19 NOTE — Telephone Encounter (Signed)
Talked with patient and patient agrees to see neurology. Does not want to go out of town.

## 2016-07-19 NOTE — Telephone Encounter (Signed)
Patient was in today for labs .   Patient complained of dizziness I was stopped by Lorriane Shire to check her blood pressure which was 138/72 pulse 72 . Patient stated she was falling asleep while staring at objects for long periods of times.   Patient stated she only wanted to see her PCP for the matter.   I see the matter was documented in a TH call but was unable to document in that encounter .   I see you told patient to take tramadol for headaches .

## 2016-07-19 NOTE — Telephone Encounter (Addendum)
I do not know whey she is constantly dizzy.  Her blood pressure is not the cause. Recommending  Neurology evaluation .

## 2016-07-22 ENCOUNTER — Encounter: Payer: Self-pay | Admitting: Internal Medicine

## 2016-07-22 ENCOUNTER — Other Ambulatory Visit: Payer: Self-pay | Admitting: Internal Medicine

## 2016-07-22 DIAGNOSIS — R7989 Other specified abnormal findings of blood chemistry: Secondary | ICD-10-CM

## 2016-07-23 ENCOUNTER — Other Ambulatory Visit: Payer: Medicare Other

## 2016-07-23 ENCOUNTER — Other Ambulatory Visit: Payer: Self-pay | Admitting: Internal Medicine

## 2016-07-23 DIAGNOSIS — I10 Essential (primary) hypertension: Secondary | ICD-10-CM

## 2016-08-05 ENCOUNTER — Other Ambulatory Visit: Payer: Self-pay | Admitting: Internal Medicine

## 2016-08-05 DIAGNOSIS — R42 Dizziness and giddiness: Secondary | ICD-10-CM | POA: Diagnosis not present

## 2016-08-05 NOTE — Telephone Encounter (Signed)
Please advise, last refill was on 07/01/16 and 07/09/2016. thanks

## 2016-08-05 NOTE — Telephone Encounter (Signed)
Pt called needing a refill for fentaNYL (DURAGESIC - DOSED MCG/HR) 75 MCG/HR Pt wants one patch.   Call pt when Rx is ready @ [phone]. Thank you!  Leave msg if she does not answer.

## 2016-08-06 MED ORDER — FENTANYL 75 MCG/HR TD PT72: 75.0000 ug | MEDICATED_PATCH | TRANSDERMAL | 0 refills | Status: DC

## 2016-08-06 NOTE — Telephone Encounter (Signed)
Clarification necessary  bc this is a controlled substance:  She would like the two patches combined to equal a 75 mcg patch,  Full 30 day supply,  This is what I have done

## 2016-08-06 NOTE — Telephone Encounter (Signed)
Correct, notified of Rx ready, thanks

## 2016-08-06 NOTE — Telephone Encounter (Signed)
Mailed unread message to patient.  

## 2016-08-26 ENCOUNTER — Ambulatory Visit (INDEPENDENT_AMBULATORY_CARE_PROVIDER_SITE_OTHER): Payer: Medicare Other

## 2016-08-26 DIAGNOSIS — Z23 Encounter for immunization: Secondary | ICD-10-CM | POA: Diagnosis not present

## 2016-08-26 DIAGNOSIS — I6522 Occlusion and stenosis of left carotid artery: Secondary | ICD-10-CM | POA: Diagnosis not present

## 2016-08-26 DIAGNOSIS — R42 Dizziness and giddiness: Secondary | ICD-10-CM | POA: Diagnosis not present

## 2016-08-31 ENCOUNTER — Ambulatory Visit: Payer: Medicare Other | Admitting: Internal Medicine

## 2016-09-02 ENCOUNTER — Other Ambulatory Visit (INDEPENDENT_AMBULATORY_CARE_PROVIDER_SITE_OTHER): Payer: Medicare Other

## 2016-09-02 DIAGNOSIS — R7989 Other specified abnormal findings of blood chemistry: Secondary | ICD-10-CM

## 2016-09-02 DIAGNOSIS — R946 Abnormal results of thyroid function studies: Secondary | ICD-10-CM

## 2016-09-03 LAB — T4 AND TSH
T4, Total: 6.5 ug/dL (ref 4.5–12.0)
TSH: 4.44 u[IU]/mL (ref 0.450–4.500)

## 2016-09-05 ENCOUNTER — Encounter: Payer: Self-pay | Admitting: Internal Medicine

## 2016-09-08 ENCOUNTER — Other Ambulatory Visit (INDEPENDENT_AMBULATORY_CARE_PROVIDER_SITE_OTHER): Payer: Self-pay | Admitting: Vascular Surgery

## 2016-09-08 DIAGNOSIS — I6523 Occlusion and stenosis of bilateral carotid arteries: Secondary | ICD-10-CM

## 2016-09-08 DIAGNOSIS — I701 Atherosclerosis of renal artery: Secondary | ICD-10-CM

## 2016-09-09 ENCOUNTER — Encounter (INDEPENDENT_AMBULATORY_CARE_PROVIDER_SITE_OTHER): Payer: Self-pay | Admitting: Vascular Surgery

## 2016-09-09 ENCOUNTER — Ambulatory Visit (INDEPENDENT_AMBULATORY_CARE_PROVIDER_SITE_OTHER): Payer: Medicare Other

## 2016-09-09 ENCOUNTER — Encounter (INDEPENDENT_AMBULATORY_CARE_PROVIDER_SITE_OTHER): Payer: Self-pay

## 2016-09-09 ENCOUNTER — Other Ambulatory Visit (INDEPENDENT_AMBULATORY_CARE_PROVIDER_SITE_OTHER): Payer: Self-pay | Admitting: Vascular Surgery

## 2016-09-09 ENCOUNTER — Ambulatory Visit (INDEPENDENT_AMBULATORY_CARE_PROVIDER_SITE_OTHER): Payer: Medicare Other | Admitting: Vascular Surgery

## 2016-09-09 ENCOUNTER — Ambulatory Visit: Payer: Medicare Other

## 2016-09-09 VITALS — BP 140/79 | HR 84 | Resp 16 | Ht 61.0 in | Wt 155.8 lb

## 2016-09-09 DIAGNOSIS — E782 Mixed hyperlipidemia: Secondary | ICD-10-CM

## 2016-09-09 DIAGNOSIS — I10 Essential (primary) hypertension: Secondary | ICD-10-CM | POA: Diagnosis not present

## 2016-09-09 DIAGNOSIS — I2510 Atherosclerotic heart disease of native coronary artery without angina pectoris: Secondary | ICD-10-CM

## 2016-09-09 DIAGNOSIS — I6529 Occlusion and stenosis of unspecified carotid artery: Secondary | ICD-10-CM | POA: Insufficient documentation

## 2016-09-09 DIAGNOSIS — I6523 Occlusion and stenosis of bilateral carotid arteries: Secondary | ICD-10-CM

## 2016-09-09 DIAGNOSIS — I701 Atherosclerosis of renal artery: Secondary | ICD-10-CM

## 2016-09-09 DIAGNOSIS — I739 Peripheral vascular disease, unspecified: Secondary | ICD-10-CM | POA: Diagnosis not present

## 2016-09-09 NOTE — Progress Notes (Signed)
MRN : [id number]  Sabrina [name] is a 75 y.o. (31-Dec-1940) female who presents with chief complaint of  Chief Complaint  Patient presents with  . Follow-up  .  History of Present Illness: The patient is seen for evaluation of her known renal artery stenosis as well as her carotid disease. She states her BP has been doing fine and that there have not been any new changes in her medications lately.  The patient denies amaurosis fugax. There is no recent history of TIA symptoms or focal motor deficits. There is no prior documented CVA.  The patient is taking enteric-coated aspirin 81 mg daily.  There is a long history of chronic migraine headaches.  She has been on a Fentanyl patch for years and recently an attempt to wean her failed.  Subsequently she was seen by [name] who started her on amitriptyline but she didn't tolerate this. There is no history of seizures.  The patient has a history of coronary artery disease, no recent episodes of angina or shortness of breath. The patient denies PAD or claudication symptoms. There is a history of hyperlipidemia which is being treated with a statin.  Duplex ultrasound of the renal arteries done today shows patent stents bilaterally with minimal elevation in the velocities. This is unchanged from her last study.  Duplex ultrasound of the carotid arteries done at [facility] on 08/26/2016 shows an occlusion of the RICA and <14% LICA (s/p left CEA remotely). This is unchanged from her last study several years ago.  Current Meds  Medication Sig  . amLODipine (NORVASC) 10 MG tablet TAKE ONE TABLET BY MOUTH ONCE DAILY  . aspirin 81 MG tablet Take 81 mg by mouth daily.  . baclofen (LIORESAL) 10 MG tablet TAKE ONE TABLET BY MOUTH THREE TIMES DAILY AS NEEDED FOR MUSCLE SPASM  . chlordiazePOXIDE (LIBRIUM) 25 MG capsule TAKE ONE CAPSULE BY MOUTH ONCE DAILY AS NEEDED FOR ANXIETY  . clopidogrel (PLAVIX) 75 MG tablet Take 1 tablet (75 mg total) by mouth  once.  . fentaNYL (DURAGESIC) 75 MCG/HR Place 1 patch (75 mcg total) onto the skin every 3 (three) days.  . hydrALAZINE (APRESOLINE) 50 MG tablet TAKE ONE TABLET BY MOUTH THREE TIMES DAILY AS NEEDED  . labetalol (NORMODYNE) 200 MG tablet Take 0.5 tablets (100 mg total) by mouth 2 (two) times daily.  Marland Kitchen labetalol (NORMODYNE) 200 MG tablet TAKE ONE-HALF TABLET BY MOUTH TWICE DAILY  . lisinopril (PRINIVIL,ZESTRIL) 40 MG tablet Take 1 tablet (40 mg total) by mouth daily. (Patient taking differently: Take 20 mg by mouth 2 (two) times daily. )  . NEXIUM 40 MG capsule TAKE ONE CAPSULE BY MOUTH ONCE DAILY  . nortriptyline (PAMELOR) 10 MG capsule Take 1 capsule at bedtime for 1 week, then increase to 2 capsules at bedtime and continue  . Omega-3 Fatty Acids (FISH OIL) 1000 MG CAPS Take 1 capsule by mouth daily.  . traMADol (ULTRAM) 50 MG tablet TAKE TWO TABLETS BY MOUTH EVERY 8 HOURS AS NEEDED  . ZETIA 10 MG tablet TAKE ONE TABLET BY MOUTH ONCE DAILY    Past Medical History:  Diagnosis Date  . Anemia of chronic kidney failure   . Anxiety   . CAD (coronary artery disease)   . Carotid artery stenosis   . Chronic kidney disease, stage III (moderate)    Followed by Dr. Juleen China  . Hyperlipidemia   . Hypertension   . Renal artery stenosis ([place])   . Secondary hyperparathyroidism ([place])   .  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 Subclavian arterial stenosis [facility])     Past Surgical History:  Procedure Laterality Date  . ABDOMINAL HYSTERECTOMY  1976  . CAROTID ARTERY ANGIOPLASTY Left   . CAROTID ENDARTERECTOMY Left   . CHOLECYSTECTOMY    . CORONARY ANGIOPLASTY WITH STENT PLACEMENT  2000  . CORONARY ARTERY BYPASS GRAFT  2000  . CYSTOSCOPY WITH STENT PLACEMENT Bilateral   . RENAL ARTERY ANGIOPLASTY Bilateral mARCH 2015  . TOE AMPUTATION Right    small toe  . TONSILLECTOMY AND ADENOIDECTOMY    . TOTAL HIP ARTHROPLASTY Left   . TOTAL HIP ARTHROPLASTY Right 2015    Social  History Social History  Substance Use Topics  . Smoking status: Former Smoker    Quit date: 10/25/1998  . Smokeless tobacco: Never Used  . Alcohol use No    Family History Family History  Problem Relation Age of Onset  . Stroke Mother   . Hypertension Mother   . Diabetes Mother   . Hypertension Father   . Heart disease Sister     MI  . Multiple sclerosis Daughter   . Multiple sclerosis Son   . Cerebral aneurysm Son   . Seizures Son   . Cerebral aneurysm Son   No family history of bleeding/clotting disorders, porphyria or autoimmune disease   Allergies  Allergen Reactions  . Citalopram     Throat closing   . Dilaudid [Hydromorphone Hcl] Nausea And Vomiting  . Hydrochlorothiazide Other (See Comments)    Decreased GFR (Nov 2015)  . Nsaids     CKD stage III - avoid nephrotoxic drugs  . Nubain [Nalbuphine Hcl]     Burning sensation in back  . Penicillins Itching  . Statins Itching     REVIEW OF SYSTEMS (Negative unless checked)  Constitutional: [] Weight loss  [] Fever  [] Chills Cardiac: [] Chest pain   [] Chest pressure   [] Palpitations   [x] Shortness of breath when laying flat   [] Shortness of breath with exertion. Vascular:  [] Pain in legs with walking   [] Pain in legs at rest  [] History of DVT   [] Phlebitis   [] Swelling in legs   [] Varicose veins   [] Non-healing ulcers Pulmonary:   [] Uses home oxygen   [] Productive cough   [] Hemoptysis   [] Wheeze  [] COPD   [] Asthma Neurologic:  [x] Dizziness   [] Seizures   [] History of stroke   [] History of TIA  [] Aphasia   [] Vissual changes   [] Weakness or numbness in arm   [] Weakness or numbness in leg Musculoskeletal:   [] Joint swelling   [x] Joint pain   [] Low back pain Hematologic:  [] Easy bruising  [] Easy bleeding   [] Hypercoagulable state   [] Anemic Gastrointestinal:  [] Diarrhea   [] Vomiting  [] Gastroesophageal reflux/heartburn   [] Difficulty swallowing. Genitourinary:  [] Chronic kidney disease   [] Difficult urination  [] Frequent  urination   [] Blood in urine Skin:  [] Rashes   [] Ulcers  Psychological:  [] History of anxiety   []  History of major depression.  Physical Examination  Vitals:   09/09/16 1029  BP: 140/79  Pulse: 84  Resp: 16  Weight: 155 lb 12.8 oz (70.7 kg)  Height: 5\' 1"  (1.549 m)   Body mass index is 29.44 kg/m. Gen: WD/WN, NAD Head: [place]/AT, No temporalis wasting.  Ear/Nose/Throat: Hearing grossly intact, nares w/o erythema or drainage, poor dentition Eyes: PER, EOMI, sclera nonicteric.  Neck: Supple, no masses.  No bruit or JVD.well healed left incisional scar for CEA  Pulmonary:  Good air movement, clear to auscultation bilaterally, no use of accessory muscles.  Cardiac: RRR, normal S1, S2, 3/6 SEM Murmurs. Vascular: bilateral carotid bruits Vessel Right Left  Radial Palpable Palpable  Ulnar Palpable Palpable  Brachial Palpable Palpable  Carotid Palpable Palpable  Femoral Palpable Palpable  Popliteal Palpable Palpable  PT Trace Palpable Trace Palpable  DP Trace Palpable Trace Palpable   Gastrointestinal: soft, non-distended. No guarding/no peritoneal signs.  Musculoskeletal: M/S 5/5 throughout.  No deformity or atrophy.  Neurologic: CN 2-12 intact. Pain and light touch intact in extremities.  Symmetrical.  Speech is fluent. Motor exam as listed above. Psychiatric: Judgment intact, Mood & affect appropriate for pt's clinical situation. Dermatologic: No rashes or ulcers noted.  No changes consistent with cellulitis. Lymph : No Cervical lymphadenopathy, no lichenification or skin changes of chronic lymphedema.  CBC Lab Results  Component Value Date   WBC 5.3 06/01/2016   HGB 11.8 (L) 06/01/2016   HCT 35.1 (L) 06/01/2016   MCV 85.6 06/01/2016   PLT 258.0 06/01/2016    BMET    Component Value Date/Time   NA 138 07/19/2016 0808   NA 139 09/13/2014   NA 134 (L) 04/12/2014 0558   K 4.8 07/19/2016 0808   K 4.4 04/12/2014 0558   CL 100 07/19/2016 0808   CL 101 04/12/2014 0558    CO2 33 (H) 07/19/2016 0808   CO2 27 04/12/2014 0558   GLUCOSE 93 07/19/2016 0808   GLUCOSE 90 04/12/2014 0558   BUN 16 07/19/2016 0808   BUN 18 09/13/2014   BUN 16 04/12/2014 0558   CREATININE 1.15 07/19/2016 0808   CREATININE 1.12 04/12/2014 0558   CALCIUM 8.8 07/19/2016 0808   CALCIUM 8.5 04/12/2014 0558   GFRNONAA 49 (L) 04/12/2014 0558   GFRAA 57 (L) 04/12/2014 0558   CrCl cannot be calculated (Patient's most recent lab result is older than the maximum 21 days allowed.).  COAG Lab Results  Component Value Date   INR 1.0 03/27/2014    Radiology No results found.  Assessment/Plan 1. Atherosclerosis of renal artery (HCC) Recommend:  The patient has evidence of atherosclerosis of the lower extremities with claudication. The patient does not voice lifestyle limiting changes at this point in time.  Noninvasive studies do not suggest clinically significant change.  No invasive studies, angiography or surgery at this time The patient should continue walking and begin a more formal exercise program. The patient should continue antiplatelet therapy and aggressive treatment of the lipid abnormalities  The patient should continue wearing graduated compression socks 10-15 mmHg strength to control the mild edema.  - VAS US RENAL ARTERY DUPLEX; Future  2. Bilateral carotid artery stenosis Recommend:  Given the patient's asymptomatic subcritical stenosis no further invasive testing or surgery at this time.  Duplex ultrasound shows moderate stenosis bilaterally.  Continue antiplatelet therapy as prescribed Continue management of CAD, HTN and Hyperlipidemia Healthy heart diet,  encouraged exercise at least 4 times per week Follow up in 6 months with duplex ultrasound and physical exam based on <50% stenosis of the bilateral carotid artery   - VAS US CAROTID; Future  3. Essential hypertension Continue antihypertensive medications as already ordered and reviewed, no changes at  this time.  4. Peripheral vascular disease ([place])  Recommend:  The patient has evidence of atherosclerosis of the lower extremities with claudication.  The patient does not voice lifestyle limiting changes at this point in time.  Noninvasive studies do not suggest clinically significant change.  No invasive  studies, angiography or surgery at this time The patient should continue walking and begin a more formal exercise program.  The patient should continue antiplatelet therapy and aggressive treatment of the lipid abnormalities  No changes in the patient's medications at this time  The patient should continue wearing graduated compression socks 10-15 mmHg strength to control the mild edema.    5. Coronary artery disease involving native coronary artery of native heart without angina pectoris Continue cardiac medications as already ordered and reviewed, no changes at this time.  6. Mixed hyperlipidemia Continue Zetia as ordered and reviewed, no changes at this time.  Past history of statin intolerance  Hortencia Pilar, MD  09/09/2016 10:53 AM

## 2016-09-10 ENCOUNTER — Other Ambulatory Visit: Payer: Self-pay | Admitting: Internal Medicine

## 2016-09-10 ENCOUNTER — Telehealth: Payer: Self-pay | Admitting: Internal Medicine

## 2016-09-10 MED ORDER — ZETIA 10 MG PO TABS: 10.0000 mg | ORAL_TABLET | Freq: Every day | ORAL | 5 refills | Status: DC

## 2016-09-10 NOTE — Telephone Encounter (Signed)
Last refill for 10 patches 08/06/16, Zetia refilled. Last OV 07/09/16

## 2016-09-10 NOTE — Telephone Encounter (Signed)
Pt called and is requesting a refill on fentaNYL (DURAGESIC) 75 MCG/HR, and ZETIA 10 MG tablet. Can we please call pt when fentanyl rx is ready to be picked up. Thank you!  Call pt @ [facility] ([address]), [place]

## 2016-09-13 ENCOUNTER — Encounter: Payer: Self-pay | Admitting: Internal Medicine

## 2016-09-13 ENCOUNTER — Telehealth: Payer: Self-pay | Admitting: Internal Medicine

## 2016-09-13 ENCOUNTER — Ambulatory Visit (INDEPENDENT_AMBULATORY_CARE_PROVIDER_SITE_OTHER): Payer: Medicare Other | Admitting: Internal Medicine

## 2016-09-13 DIAGNOSIS — I10 Essential (primary) hypertension: Secondary | ICD-10-CM | POA: Diagnosis not present

## 2016-09-13 DIAGNOSIS — M542 Cervicalgia: Secondary | ICD-10-CM | POA: Diagnosis not present

## 2016-09-13 DIAGNOSIS — R51 Headache: Secondary | ICD-10-CM

## 2016-09-13 DIAGNOSIS — I701 Atherosclerosis of renal artery: Secondary | ICD-10-CM

## 2016-09-13 DIAGNOSIS — F1190 Opioid use, unspecified, uncomplicated: Secondary | ICD-10-CM | POA: Diagnosis not present

## 2016-09-13 DIAGNOSIS — M47812 Spondylosis without myelopathy or radiculopathy, cervical region: Secondary | ICD-10-CM

## 2016-09-13 DIAGNOSIS — F411 Generalized anxiety disorder: Secondary | ICD-10-CM | POA: Diagnosis not present

## 2016-09-13 DIAGNOSIS — Z789 Other specified health status: Secondary | ICD-10-CM

## 2016-09-13 DIAGNOSIS — R519 Headache, unspecified: Secondary | ICD-10-CM

## 2016-09-13 MED ORDER — FENTANYL 75 MCG/HR TD PT72: 75.0000 ug | MEDICATED_PATCH | TRANSDERMAL | 0 refills | Status: DC

## 2016-09-13 NOTE — Progress Notes (Signed)
Subjective:  Patient ID: Sabrina [name], female    DOB: January 15, 1941  Age: 75 y.o. MRN: [id number]  CC: Diagnoses of Generalized anxiety disorder, Statin intolerance, Recurrent occipital headache, Essential hypertension, Cervicalgia, Chronic narcotic use, and Cervical spondylosis without myelopathy were pertinent to this visit.  HPI Sabrina [name] presents for follow up on multiple issues.  35 miutes was spent with patient today in face to face time reviewing and managing  recent episode of wheezing, and on chronic issues includinng uncontrolled hypertension, chronic neck pain with migraine headaches, PAD and GAD.  1) She reports that she had  developed dyspnea wheezing that started on Nov  7th. Felt like her throat was closing up.  Used flonase.  She called the office and an appointment was made for Nov 9 but was missed by the patient  who was reported as sounding confused at the time staff called. She remains very upset about missing a scheduled appointment because this is highly unlikely for her,  and recalls that she received conflicting information about the date and from a recorded message.  Since then her wheezing has resolved. She has no history of COPD and reports that she has wheezing episodes not more than once or twice per year   2) Since her last visit she was seen by [name] (Neurology) for E & M of persistent headaches accompanied by dizziness.   She was prescribed Pamelor,  but experienced  increased sedation and tremors.  She was advised to reduce dose, but since then she has stopped it. Because her headaches have improved in frequency but not resolved. The pain involves the left frontal/parietal  area. At the time of prescription of Pamelor, she was advised to stop using tramadol. AN ESR was checked and was 32 (normal is 0 to 3)). A carotid doppler was ordered  .   Records from neurology evaluation were reviewed with patient in detail.    3) She saw  [name])on Nov 13 for follow up on known carotid stenosis with prior left CEA  and renal artery stenosis treated with bilateral stents which were patent by repeat evaluation.  [facility] UA of carotids NOv 2 was reported a unchanged as well , showing an occluded RICA and < 03% LICA (historyof left CEA remotely)   4) Hypertension BPS have been persistently elevated from 6 pm to 8 pm . She measures her BP three times daily and more often if she feels poorly,  adjusts her mediation based on HER readings.  Regimen:   lisinopril 20 mg 8 am 12 noon and nighttime  One whole amlodipine daily at night  Labetalol 1/2 tablet twice daily        Outpatient Medications Prior to Visit  Medication Sig Dispense Refill  . amLODipine (NORVASC) 10 MG tablet TAKE ONE TABLET BY MOUTH ONCE DAILY 90 tablet 1  . aspirin 81 MG tablet Take 81 mg by mouth daily.    . baclofen (LIORESAL) 10 MG tablet TAKE ONE TABLET BY MOUTH THREE TIMES DAILY AS NEEDED FOR MUSCLE SPASM 90 tablet 1  . chlordiazePOXIDE (LIBRIUM) 25 MG capsule TAKE ONE CAPSULE BY MOUTH ONCE DAILY AS NEEDED FOR ANXIETY 30 capsule 5  . clopidogrel (PLAVIX) 75 MG tablet Take 1 tablet (75 mg total) by mouth once. 90 tablet 2  . hydrALAZINE (APRESOLINE) 50 MG tablet TAKE ONE TABLET BY MOUTH THREE TIMES DAILY AS NEEDED 270 tablet 1  . labetalol (NORMODYNE) 200 MG tablet  Take 0.5 tablets (100 mg total) by mouth 2 (two) times daily. 90 tablet 2  . labetalol (NORMODYNE) 200 MG tablet TAKE ONE-HALF TABLET BY MOUTH TWICE DAILY 90 tablet 2  . lisinopril (PRINIVIL,ZESTRIL) 40 MG tablet TAKE ONE TABLET BY MOUTH ONCE DAILY 90 tablet 1  . NEXIUM 40 MG capsule TAKE ONE CAPSULE BY MOUTH ONCE DAILY 90 capsule 3  . Omega-3 Fatty Acids (FISH OIL) 1000 MG CAPS Take 1 capsule by mouth daily.    . traMADol (ULTRAM) 50 MG tablet TAKE TWO TABLETS BY MOUTH EVERY 8 HOURS AS NEEDED 180 tablet 3  . ZETIA 10 MG tablet TAKE ONE TABLET BY MOUTH ONCE DAILY 30 tablet 5  . ZETIA 10 MG  tablet Take 1 tablet (10 mg total) by mouth daily. 30 tablet 5  . fentaNYL (DURAGESIC) 75 MCG/HR Place 1 patch (75 mcg total) onto the skin every 3 (three) days. 10 patch 0  . nortriptyline (PAMELOR) 10 MG capsule Take 1 capsule at bedtime for 1 week, then increase to 2 capsules at bedtime and continue    . predniSONE (DELTASONE) 10 MG tablet 6 tablets on Day 1 , then reduce by 1 tablet daily until gone (Patient not taking: Reported on 09/09/2016) 21 tablet 0   No facility-administered medications prior to visit.     Review of Systems;  Patient denies, fevers, malaise, unintentional weight loss, skin rash, eye pain, sinus congestion and sinus pain, sore throat, dysphagia,  hemoptysis , cough, dyspnea, wheezing, chest pain, palpitations, orthopnea, edema, abdominal pain, nausea, melena, diarrhea, constipation, flank pain, dysuria, hematuria, urinary  Frequency, nocturia, numbness, tingling, seizures,  Focal weakness, Loss of consciousness,  Tremor, insomnia, depression,  and suicidal ideation.      Objective:  BP (!) 180/80   Pulse 67   Temp 98.1 F (36.7 C) (Oral)   Resp 12   Ht 5' 1" (1.549 m)   Wt 156 lb 12 oz (71.1 kg)   SpO2 99%   BMI 29.62 kg/m   BP Readings from Last 3 Encounters:  09/13/16 (!) 180/80  09/09/16 140/79  07/09/16 (!) 142/82    Wt Readings from Last 3 Encounters:  09/13/16 156 lb 12 oz (71.1 kg)  09/09/16 155 lb 12.8 oz (70.7 kg)  07/09/16 163 lb (73.9 kg)    General appearance: alert, coherent, cooperative and appears stated age Ears: normal TM's and external ear canals both ears Throat: lips, mucosa, and tongue normal; teeth and gums normal Neck: no adenopathy, no carotid bruit, supple, symmetrical, trachea midline and thyroid not enlarged, symmetric, no tenderness/mass/nodules Back: symmetric, no curvature. ROM normal. No CVA tenderness. Lungs: clear to auscultation bilaterally Heart: regular rate and rhythm, S1, S2 normal, no murmur, click, rub or  gallop Abdomen: soft, non-tender; bowel sounds normal; no masses,  no organomegaly Pulses: 2+ and symmetric Skin: Skin color, texture, turgor normal. No rashes or lesions Lymph nodes: Cervical, supraclavicular, and axillary nodes normal.  Lab Results  Component Value Date   HGBA1C 5.9 07/19/2016   HGBA1C 5.8 06/02/2015    Lab Results  Component Value Date   CREATININE 1.15 07/19/2016   CREATININE 1.09 06/01/2016   CREATININE 1.02 03/05/2016    Lab Results  Component Value Date   WBC 5.3 06/01/2016   HGB 11.8 (L) 06/01/2016   HCT 35.1 (L) 06/01/2016   PLT 258.0 06/01/2016   GLUCOSE 93 07/19/2016   CHOL 167 07/19/2016   TRIG 55.0 07/19/2016   HDL 57.90 07/19/2016   LDLDIRECT 109.0  03/05/2016   LDLCALC 98 07/19/2016   ALT 11 07/19/2016   AST 14 07/19/2016   NA 138 07/19/2016   K 4.8 07/19/2016   CL 100 07/19/2016   CREATININE 1.15 07/19/2016   BUN 16 07/19/2016   CO2 33 (H) 07/19/2016   TSH 4.440 09/02/2016   INR 1.0 03/27/2014   HGBA1C 5.9 07/19/2016    No results found.  Assessment & Plan:   Problem List Items Addressed This Visit    HTN (hypertension)    Labile, with history of RAS s/p bilateral stenting.  Both stents are patent per Vascular eval Nov 2017, I believe her labile hypertension is aggravated by her suboptimally treated anxiety but I cannot persuade her to consider SSRI therapy.  Her current medication regimen is unwieldy .  The following changes were made:  Lisinopril 20 mg twice (from three times daily) amlodipine once daily Hydralazine 50 mg three times daily Labetalol twice daily 1/2 tablet        Chronic narcotic use    Secondary to chronic low back pain and cervicalgia with documented lumbar and cervical radiculitis .. ,  Continue Fentanyl and baclofen . Refill history confirmed via [place] Controlled Substance databas, accessed by me today..      Generalized anxiety disorder    She continues to defer SSRI therapy .  Continue Librium prn not  more than once daily       Statin intolerance    Continue Zetia for PAD with history of carotid and renal artery stenosis      Recurrent occipital headache    Neurology evaluation was done per patient request.  Despite not tolerating the medication Dr. Melrose Nakayama prescribed,  Her headache recurrence has improved, suggesting that anxiety is playing a role in her recurrent pain.  She did not tolerate a weaning trial of fentanyl in the past with reduced dose.       Relevant Medications   fentaNYL (DURAGESIC) 75 MCG/HR   Cervical spondylosis without myelopathy    MRI of cervical spine done Oct 2016 showed the following: IMPRESSION: 1. 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 2. Left-sided disc extrusion at C6-7 which may affect the left C7 Nerve.  She has refused Pain clinic referral.  Refilling Fentanyl 75 mcg patches, which she is wearing today       Relevant Medications   fentaNYL (DURAGESIC) 75 MCG/HR   Cervicalgia      I have discontinued Ms. Meloni's predniSONE. I am also having her maintain her aspirin, Fish Oil, clopidogrel, labetalol, NEXIUM, traMADol, hydrALAZINE, baclofen, chlordiazePOXIDE, amLODipine, ZETIA, labetalol, nortriptyline, lisinopril, ZETIA, and fentaNYL.  Meds ordered this encounter  Medications  . fentaNYL (DURAGESIC) 75 MCG/HR    Sig: Place 1 patch (75 mcg total) onto the skin every 3 (three) days.    Dispense:  10 patch    Refill:  0    Medications Discontinued During This Encounter  Medication Reason  . predniSONE (DELTASONE) 10 MG tablet Completed Course  . fentaNYL (DURAGESIC) 75 MCG/HR Reorder   A total of 40 minutes was spent with patient more than half of which was spent in counseling patient on the above mentioned issues , reviewing and explaining recent labs and imaging studies done, and coordination of care. Follow-up: Return in about 4 weeks (around  10/11/2016) for uncontrolled hypertension .   Crecencio Mc, MD

## 2016-09-13 NOTE — Progress Notes (Signed)
Pre-visit discussion using our clinic review tool. No additional management support is needed unless otherwise documented below in the visit note.  

## 2016-09-13 NOTE — Telephone Encounter (Signed)
noted 

## 2016-09-13 NOTE — Patient Instructions (Addendum)
Your blood pressure regimen is too complicated:  I want you to reduce the lisinopril to 20 mg twice daily (5  am and 5 PM)  Take hydralazine 50 mg three times daily ( 4:30 am ,  12 am  And 9 pm)  amlodipine once daily  In the morning  (5 am )  Labetalol 1/2 tablet  twice daily  ( 5 am and 5 PM)

## 2016-09-13 NOTE — Telephone Encounter (Signed)
I called the patient to inquire why she missed her appointment on 11.7.17. She stated that she did not miss her appointment that she was informed by someone named Burley Saver that she was scheduled for her appointment on Thursday 11.9.17 and she should come in to have labs draw and see Dr. Derrel Nip. I informed the patient that appointment was for labs only . She stated again that she spoke with someone named [place] and if we had a [place] that worked here . I informed the patient that we do not have a [place] that works her we do have a Systems analyst. She stated that she was going to start writing things down and asked me what was my name. I gave her my name then she asked for another appointment time and apologized for missing her appointment.She also stated that she would not have missed a doctors appointment for anything in the world. The previous day she spoke with Janett Billow regarding scheduling an office visit and her lab appointment time . Janett Billow stated that the patient called her three times in that day asking about her appointment time.

## 2016-09-16 NOTE — Assessment & Plan Note (Addendum)
Secondary to chronic low back pain and cervicalgia with documented lumbar and cervical radiculitis .. ,  Continue Fentanyl and baclofen . Refill history confirmed via [place] Controlled Substance databas, accessed by me today.Marland Kitchen

## 2016-09-16 NOTE — Assessment & Plan Note (Signed)
Continue Zetia for PAD with history of carotid and renal artery stenosis

## 2016-09-16 NOTE — Assessment & Plan Note (Signed)
She continues to defer SSRI therapy .  Continue Librium prn not more than once daily

## 2016-09-16 NOTE — Assessment & Plan Note (Signed)
Neurology evaluation was done per patient request.  Despite not tolerating the medication Dr. Melrose Nakayama prescribed,  Her headache recurrence has improved, suggesting that anxiety is playing a role in her recurrent pain.  She did not tolerate a weaning trial of fentanyl in the past with reduced dose.

## 2016-09-16 NOTE — Assessment & Plan Note (Signed)
Labile, with history of RAS s/p bilateral stenting.  Both stents are patent per Vascular eval Nov 2017, I believe her labile hypertension is aggravated by her suboptimally treated anxiety but I cannot persuade her to consider SSRI therapy.  Her current medication regimen is unwieldy .  The following changes were made:  Lisinopril 20 mg twice (from three times daily) amlodipine once daily Hydralazine 50 mg three times daily Labetalol twice daily 1/2 tablet

## 2016-09-16 NOTE — Assessment & Plan Note (Signed)
MRI of cervical spine done Oct 2016 showed the following: IMPRESSION: 1. 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 2. Left-sided disc extrusion at C6-7 which may affect the left C7 Nerve.  She has refused Pain clinic referral.  Refilling Fentanyl 75 mcg patches, which she is wearing today

## 2016-09-20 NOTE — Telephone Encounter (Signed)
Mailed unread message to patient.  

## 2016-09-21 DIAGNOSIS — R809 Proteinuria, unspecified: Secondary | ICD-10-CM | POA: Diagnosis not present

## 2016-09-21 DIAGNOSIS — N183 Chronic kidney disease, stage 3 (moderate): Secondary | ICD-10-CM | POA: Diagnosis not present

## 2016-09-21 DIAGNOSIS — I129 Hypertensive chronic kidney disease with stage 1 through stage 4 chronic kidney disease, or unspecified chronic kidney disease: Secondary | ICD-10-CM | POA: Diagnosis not present

## 2016-09-21 DIAGNOSIS — I701 Atherosclerosis of renal artery: Secondary | ICD-10-CM | POA: Diagnosis not present

## 2016-09-22 ENCOUNTER — Ambulatory Visit: Payer: Medicare Other

## 2016-09-22 ENCOUNTER — Ambulatory Visit (INDEPENDENT_AMBULATORY_CARE_PROVIDER_SITE_OTHER): Payer: Medicare Other

## 2016-09-22 VITALS — BP 142/70 | HR 66 | Temp 98.1°F | Resp 12 | Ht 60.0 in | Wt 158.0 lb

## 2016-09-22 DIAGNOSIS — E2839 Other primary ovarian failure: Secondary | ICD-10-CM

## 2016-09-22 DIAGNOSIS — Z0000 Encounter for general adult medical examination without abnormal findings: Secondary | ICD-10-CM

## 2016-09-22 NOTE — Patient Instructions (Addendum)
  Sabrina [name] , Thank you for taking time to come for your Medicare Wellness Visit. I appreciate your ongoing commitment to your health goals. Please review the following plan we discussed and let me know if I can assist you in the future.   FOLLOW UP WITH DR. Derrel Nip AS NEEDED.  BONE DENSITY (DEXA SCAN) AS DIRECTED.  COLOGUARD AS DIRECTED.  These are the goals we discussed: Goals    . Increase physical activity          Stay active.  STRETCH! Chair exercises as demonstrated.  Increase as tolerated.      . Increase water intake          Stay hydrated and drink plenty of fluids/water       This is a list of the screening recommended for you and due dates:  Health Maintenance  Topic Date Due  . Colon Cancer Screening  02/18/1992  . Shingles Vaccine  02/17/2002  . DEXA scan (bone density measurement)  02/18/2007  . Pneumonia vaccines (2 of 2 - PPSV23) 12/28/2014  . Mammogram  12/12/2016  . Tetanus Vaccine  01/11/2024  . Flu Shot  Completed   Bone Densitometry Introduction Bone densitometry is an imaging test that uses a special X-ray to measure the amount of calcium and other minerals in your bones (bone density). This test is also known as a bone mineral density test or dual-energy X-ray absorptiometry (DXA). The test can measure bone density at your hip and your spine. It is similar to having a regular X-ray. You may have this test to:  Diagnose a condition that causes weak or thin bones (osteoporosis).  Predict your risk of a broken bone (fracture).  Determine how well osteoporosis treatment is working. Tell a health care provider about:  Any allergies you have.  All medicines you are taking, including vitamins, herbs, eye drops, creams, and over-the-counter medicines.  Any problems you or family members have had with anesthetic medicines.  Any blood disorders you have.  Any surgeries you have had.  Any medical conditions you have.  Possibility of  pregnancy.  Any other medical test you had within the previous 14 days that used contrast material. What are the risks? Generally, this is a safe procedure. However, problems can occur and may include the following:  This test exposes you to a very small amount of radiation.  The risks of radiation exposure may be greater to unborn children. What happens before the procedure?  Do not take any calcium supplements for 24 hours before having the test. You can otherwise eat and drink what you usually do.  Take off all metal jewelry, eyeglasses, dental appliances, and any other metal objects. What happens during the procedure?  You may lie on an exam table. There will be an X-ray generator below you and an imaging device above you.  Other devices, such as boxes or braces, may be used to position your body properly for the scan.  You will need to lie still while the machine slowly scans your body.  The images will show up on a computer monitor. What happens after the procedure? You may need more testing at a later time. This information is not intended to replace advice given to you by your health care provider. Make sure you discuss any questions you have with your health care provider. Document Released: 11/02/2004 Document Revised: 03/18/2016 Document Reviewed: 03/21/2014  2017 Elsevier

## 2016-09-22 NOTE — Progress Notes (Signed)
Subjective:   Sabrina [name] is a 75 y.o. female who presents for an Initial Medicare Annual Wellness Visit.  Review of Systems    No ROS.  Medicare Wellness Visit.  Cardiac Risk Factors include: advanced age (>39men, >31 women);hypertension;obesity (BMI >30kg/m2)     Objective:    Today's Vitals   09/22/16 1601  BP: (!) 142/70  Pulse: 66  Resp: 12  Temp: 98.1 F (36.7 C)  TempSrc: Oral  SpO2: 99%  Weight: 158 lb (71.7 kg)  Height: 5' (1.524 m)   Body mass index is 30.86 kg/m.   Current Medications (verified) Outpatient Encounter Prescriptions as of 09/22/2016  Medication Sig  . amLODipine (NORVASC) 10 MG tablet TAKE ONE TABLET BY MOUTH ONCE DAILY  . aspirin 81 MG tablet Take 81 mg by mouth daily.  . baclofen (LIORESAL) 10 MG tablet TAKE ONE TABLET BY MOUTH THREE TIMES DAILY AS NEEDED FOR MUSCLE SPASM  . chlordiazePOXIDE (LIBRIUM) 25 MG capsule TAKE ONE CAPSULE BY MOUTH ONCE DAILY AS NEEDED FOR ANXIETY  . clopidogrel (PLAVIX) 75 MG tablet Take 1 tablet (75 mg total) by mouth once.  . fentaNYL (DURAGESIC) 75 MCG/HR Place 1 patch (75 mcg total) onto the skin every 3 (three) days.  . hydrALAZINE (APRESOLINE) 50 MG tablet TAKE ONE TABLET BY MOUTH THREE TIMES DAILY AS NEEDED  . labetalol (NORMODYNE) 200 MG tablet Take 0.5 tablets (100 mg total) by mouth 2 (two) times daily.  Marland Kitchen labetalol (NORMODYNE) 200 MG tablet TAKE ONE-HALF TABLET BY MOUTH TWICE DAILY  . lisinopril (PRINIVIL,ZESTRIL) 40 MG tablet TAKE ONE TABLET BY MOUTH ONCE DAILY  . NEXIUM 40 MG capsule TAKE ONE CAPSULE BY MOUTH ONCE DAILY  . Omega-3 Fatty Acids (FISH OIL) 1000 MG CAPS Take 1 capsule by mouth daily.  . traMADol (ULTRAM) 50 MG tablet TAKE TWO TABLETS BY MOUTH EVERY 8 HOURS AS NEEDED  . ZETIA 10 MG tablet TAKE ONE TABLET BY MOUTH ONCE DAILY  . ZETIA 10 MG tablet Take 1 tablet (10 mg total) by mouth daily.  . [DISCONTINUED] nortriptyline (PAMELOR) 10 MG capsule Take 1 capsule at bedtime for 1 week,  then increase to 2 capsules at bedtime and continue   No facility-administered encounter medications on file as of 09/22/2016.     Allergies (verified) Citalopram; Dilaudid [hydromorphone hcl]; Hydrochlorothiazide; Nsaids; Nubain [nalbuphine hcl]; Penicillins; and Statins   History: Past Medical History:  Diagnosis Date  . Anemia of chronic kidney failure   . Anxiety   . CAD (coronary artery disease)   . Carotid artery stenosis   . Chronic kidney disease, stage III (moderate)    Followed by Dr. Juleen China  . Hyperlipidemia   . Hypertension   . Renal artery stenosis ([place])   . Secondary hyperparathyroidism ([place])   . 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 Subclavian arterial stenosis [facility])    Past Surgical History:  Procedure Laterality Date  . ABDOMINAL HYSTERECTOMY  1976  . CAROTID ARTERY ANGIOPLASTY Left   . CAROTID ENDARTERECTOMY Left   . CHOLECYSTECTOMY    . CORONARY ANGIOPLASTY WITH STENT PLACEMENT  2000  . CORONARY ARTERY BYPASS GRAFT  2000  . CYSTOSCOPY WITH STENT PLACEMENT Bilateral   . RENAL ARTERY ANGIOPLASTY Bilateral mARCH 2015  . TOE AMPUTATION Right    small toe  . TONSILLECTOMY AND ADENOIDECTOMY    . TOTAL HIP ARTHROPLASTY Left   . TOTAL HIP ARTHROPLASTY Right 2015   Family History  Problem Relation Age  of Onset  . Stroke Mother   . Hypertension Mother   . Diabetes Mother   . Hypertension Father   . Heart disease Sister     MI  . Multiple sclerosis Daughter   . Multiple sclerosis Son   . Cerebral aneurysm Son   . Seizures Son   . Cerebral aneurysm Son    Social History   Occupational History  . Not on file.   Social History Main Topics  . Smoking status: Former Smoker    Quit date: 10/25/1998  . Smokeless tobacco: Never Used  . Alcohol use No  . Drug use: No  . Sexual activity: Not Currently    Tobacco Counseling Counseling given: Not Answered   Activities of Daily Living In your present state of health, do you have any  difficulty performing the following activities: 09/22/2016  Hearing? N  Vision? N  Difficulty concentrating or making decisions? N  Walking or climbing stairs? N  Dressing or bathing? N  Doing errands, shopping? N  Preparing Food and eating ? N  Using the Toilet? N  In the past six months, have you accidently leaked urine? N  Do you have problems with loss of bowel control? N  Managing your Medications? N  Managing your Finances? N  Housekeeping or managing your Housekeeping? N  Some recent data might be hidden    Immunizations and Health Maintenance Immunization History  Administered Date(s) Administered  . Influenza,inj,Quad PF,36+ Mos 06/24/2014, 08/26/2016  . Pneumococcal Conjugate-13 12/27/2013  . Tdap 01/10/2014   Health Maintenance Due  Topic Date Due  . COLONOSCOPY  02/18/1992  . ZOSTAVAX  02/17/2002  . DEXA SCAN  02/18/2007  . PNA vac Low Risk Adult (2 of 2 - PPSV23) 12/28/2014    Patient Care Team: Crecencio Mc, MD as PCP - General (Internal Medicine)  Indicate any recent Medical Services you may have received from other than [facility] providers in the past year (date may be approximate).     Assessment:   This is a routine wellness examination for Sabrina [name]. The goal of the wellness visit is to assist the patient how to close the gaps in care and create a preventative care plan for the patient.   Osteoporosis risk reviewed.  DEXA SCAN ordered; follow as directed.  Educational material provided.  Medications reviewed; taking without issues or barriers.  Safety issues reviewed; smoke detectors in the home. No firearms in the home. Wears seatbelts when driving or riding with others. No violence in the home.  No identified risk were noted; The patient was oriented x 3; appropriate in dress and manner and no objective failures at ADL's or IADL's.   BMI; discussed the importance of a healthy diet, water intake and exercise. Educational material  provided.  COLOGUARD ordered; follow as directed.  Educational material provided.    Pneumovax 23 and ZOSTAVAX vaccine deferred per patient request; educational material provided.    Patient Concerns: None at this time. Follow up with PCP as needed.  Hearing/Vision screen Hearing Screening Comments: Passes the whisper test Vision Screening Comments: Followed by Dr. Gloriann Loan Last OV 01/2016  Dietary issues and exercise activities discussed: Current Exercise Habits: The patient does not participate in regular exercise at present  Goals    . Increase physical activity          Stay active.  STRETCH! Chair exercises as demonstrated.  Increase as tolerated.      . Increase water intake  Stay hydrated and drink plenty of fluids/water      Depression Screen PHQ 2/9 Scores 09/22/2016 06/24/2014  PHQ - 2 Score 0 0    Fall Risk Fall Risk  09/22/2016 06/24/2014  Falls in the past year? No No    Cognitive Function:     6CIT Screen 09/22/2016  What Year? 0 points  What month? 0 points  What time? 0 points  Count back from 20 0 points  Months in reverse 0 points  Repeat phrase 0 points  Total Score 0    Screening Tests Health Maintenance  Topic Date Due  . COLONOSCOPY  02/18/1992  . ZOSTAVAX  02/17/2002  . DEXA SCAN  02/18/2007  . PNA vac Low Risk Adult (2 of 2 - PPSV23) 12/28/2014  . MAMMOGRAM  12/12/2016  . TETANUS/TDAP  01/11/2024  . INFLUENZA VACCINE  Completed      Plan:    End of life planning; Advance aging; Advanced directives discussed. No HCPOA/Living Will.  Additional information declined at this time.   Medicare Attestation I have personally reviewed: The patient's medical and social history Their use of alcohol, tobacco or illicit drugs Their current medications and supplements The patient's functional ability including ADLs,fall risks, home safety risks, cognitive, and hearing and visual impairment Diet and physical activities Evidence for  depression   The patient's weight, height, BMI, and visual acuity have been recorded in the chart.  I have made referrals and provided education to the patient based on review of the above and I have provided the patient with a written personalized care plan for preventive services.     During the course of the visit, Sabrina [name] was educated and counseled about the following appropriate screening and preventive services:   Vaccines to include Pneumoccal, Influenza, Hepatitis B, Td, Zostavax, HCV  Electrocardiogram  Cardiovascular disease screening  Colorectal cancer screening  Bone density screening  Diabetes screening  Glaucoma screening  Mammography/PAP  Nutrition counseling  Smoking cessation counseling  Patient Instructions (the written plan) were given to the patient.    Varney Biles, LPN   [id number]

## 2016-09-23 ENCOUNTER — Telehealth: Payer: Self-pay | Admitting: Internal Medicine

## 2016-09-23 NOTE — Telephone Encounter (Signed)
Recommendations for patient taking medication?

## 2016-09-23 NOTE — Telephone Encounter (Signed)
Pt called about wanting to know if she should take her BP medication? Her BP this morning was 93/67. Pt only took her hydrALAZINE (APRESOLINE) 50 MG tablet. At 5am this morning. Please advise?  Call pt @ [phone]. Thank you!

## 2016-09-23 NOTE — Telephone Encounter (Signed)
She is on 4 BP medications,  So I'm not sure which one she is referring to.  She can suspend the hydralazine since this is the most difficult one to take (it's 3 times daily ) and just take the lisinopril, the amlodipine and the labetalol AS DIRECTED for a week BEFORE MAKING ANY MORE CHANGES

## 2016-09-23 NOTE — Telephone Encounter (Signed)
Pt was advised of the PCPs recommendations. Pt stated that her BP was 111/64 she was advised to continue medication for 1 week before anymore changes were made.

## 2016-09-28 NOTE — Progress Notes (Signed)
  I have reviewed the above information and agree with above.   Teresa Tullo, MD 

## 2016-10-05 DIAGNOSIS — Z1212 Encounter for screening for malignant neoplasm of rectum: Secondary | ICD-10-CM | POA: Diagnosis not present

## 2016-10-05 DIAGNOSIS — Z1211 Encounter for screening for malignant neoplasm of colon: Secondary | ICD-10-CM | POA: Diagnosis not present

## 2016-10-07 LAB — COLOGUARD: Cologuard: NEGATIVE

## 2016-10-13 ENCOUNTER — Telehealth: Payer: Self-pay

## 2016-10-13 NOTE — Telephone Encounter (Signed)
The results of your cologuard test were negative.   We will repeat this every 3 years  Until you are 86 for colon CA screening.  

## 2016-10-13 NOTE — Telephone Encounter (Signed)
Cologuard received and abstracted

## 2016-10-14 NOTE — Telephone Encounter (Signed)
Lmom for patient to call back for results of her cologuard

## 2016-10-19 ENCOUNTER — Other Ambulatory Visit: Payer: Self-pay | Admitting: Internal Medicine

## 2016-10-19 ENCOUNTER — Telehealth: Payer: Self-pay | Admitting: Internal Medicine

## 2016-10-19 MED ORDER — ZETIA 10 MG PO TABS: 10.0000 mg | ORAL_TABLET | Freq: Every day | ORAL | 5 refills | Status: DC

## 2016-10-19 NOTE — Telephone Encounter (Signed)
Ok to refill Baclofen? Last OV 09/13/16?

## 2016-10-19 NOTE — Telephone Encounter (Signed)
Pt called requesting medication refills on baclofen (LIORESAL) 10 MG tablet, labetalol (NORMODYNE) 200 MG tablet, and fentaNYL (DURAGESIC) 75 MCG/HR. Call when completed.  Please advise, thank you   [name] [place] [facility] [place]  Call pt @ [phone]

## 2016-10-19 NOTE — Telephone Encounter (Signed)
Script refilled

## 2016-10-19 NOTE — Telephone Encounter (Signed)
Pt called back and stated that she needs her ZETIA 10 MG tablet refilled. Thank you!

## 2016-10-21 ENCOUNTER — Telehealth: Payer: Self-pay | Admitting: Internal Medicine

## 2016-10-21 NOTE — Telephone Encounter (Signed)
Patient notified of her cologuard results

## 2016-10-21 NOTE — Telephone Encounter (Signed)
The results of patient's cologuard is negative.   We will repeat every 3 years For colon CA screening.  

## 2016-10-22 ENCOUNTER — Other Ambulatory Visit: Payer: Self-pay

## 2016-10-22 MED ORDER — FENTANYL 75 MCG/HR TD PT72: 75.0000 ug | MEDICATED_PATCH | TRANSDERMAL | 0 refills | Status: DC

## 2016-10-22 NOTE — Telephone Encounter (Signed)
Medication has been refilled.

## 2016-10-22 NOTE — Telephone Encounter (Signed)
LM for patient that prescription needs to be picked up at front desk for fentanyl patch.

## 2016-11-12 ENCOUNTER — Other Ambulatory Visit: Payer: Self-pay | Admitting: Internal Medicine

## 2016-11-19 ENCOUNTER — Telehealth: Payer: Self-pay | Admitting: Internal Medicine

## 2016-11-19 NOTE — Telephone Encounter (Signed)
Pt came in stating that Dr Derrel Nip needs to write a exceptional letter so her medication of ZETIA 10 MG tablet  can continue to be covered. Pt is allergic to all stain medication. Instructions are in folder. Thank you!  Call pt @ [phone]

## 2016-11-22 NOTE — Telephone Encounter (Signed)
Pt cannot take the generic form of Zetia. She states it doesn't work for her body rejects the [employer]. She stated she needs a letter sent to [facility]. Please advise

## 2016-12-02 ENCOUNTER — Telehealth: Payer: Self-pay | Admitting: Internal Medicine

## 2016-12-02 MED ORDER — FENTANYL 75 MCG/HR TD PT72: 75.0000 ug | MEDICATED_PATCH | TRANSDERMAL | 0 refills | Status: DC

## 2016-12-02 NOTE — Telephone Encounter (Signed)
fentaNYL (DURAGESIC) 75 MCG/HR

## 2016-12-02 NOTE — Telephone Encounter (Signed)
Last OV 09/13/16.  No existing w/ you. Fentanyl LF 10/22/16 #10 w/ 0 Rf.  Ok to Rf?

## 2016-12-02 NOTE — Telephone Encounter (Signed)
Ok to refill  For one month,  Please print .  Needs office visit schedule 3 month follow up for narcotics refills

## 2016-12-03 MED ORDER — FENTANYL 75 MCG/HR TD PT72: 75.0000 ug | MEDICATED_PATCH | TRANSDERMAL | 0 refills | Status: DC

## 2016-12-03 NOTE — Telephone Encounter (Signed)
Rx printed/pending MD sig. Pt informed of below and to schedule 3 month f/u with Dr. Derrel Nip.

## 2016-12-08 NOTE — Telephone Encounter (Signed)
Received denial for zetia call ed in appeal to [facility] , faxed medical documentation as to need for Zetia appeal can take 7 business day expatiated Patient has been notified.

## 2016-12-13 ENCOUNTER — Ambulatory Visit (INDEPENDENT_AMBULATORY_CARE_PROVIDER_SITE_OTHER): Payer: Medicare Other | Admitting: Internal Medicine

## 2016-12-13 ENCOUNTER — Encounter: Payer: Self-pay | Admitting: Internal Medicine

## 2016-12-13 VITALS — BP 150/78 | HR 69 | Temp 98.1°F | Wt 159.0 lb

## 2016-12-13 DIAGNOSIS — G8929 Other chronic pain: Secondary | ICD-10-CM

## 2016-12-13 DIAGNOSIS — E782 Mixed hyperlipidemia: Secondary | ICD-10-CM

## 2016-12-13 DIAGNOSIS — K2100 Gastro-esophageal reflux disease with esophagitis, without bleeding: Secondary | ICD-10-CM

## 2016-12-13 DIAGNOSIS — Z1231 Encounter for screening mammogram for malignant neoplasm of breast: Secondary | ICD-10-CM

## 2016-12-13 DIAGNOSIS — M542 Cervicalgia: Secondary | ICD-10-CM

## 2016-12-13 DIAGNOSIS — N644 Mastodynia: Secondary | ICD-10-CM

## 2016-12-13 DIAGNOSIS — Z1239 Encounter for other screening for malignant neoplasm of breast: Secondary | ICD-10-CM

## 2016-12-13 MED ORDER — HYOSCYAMINE SULFATE 0.125 MG SL SUBL: 0.1250 mg | SUBLINGUAL_TABLET | SUBLINGUAL | 0 refills | Status: DC | PRN

## 2016-12-13 MED ORDER — LISINOPRIL 20 MG PO TABS: 40.0000 mg | ORAL_TABLET | Freq: Every day | ORAL | 1 refills | Status: DC

## 2016-12-13 NOTE — Progress Notes (Signed)
Subjective:  Patient ID: Sabrina [name], female    DOB: 1941/04/09  Age: 76 y.o. MRN: [id number]  CC: The primary encounter diagnosis was Breast cancer screening. Diagnoses of Cervicalgia, Mixed hyperlipidemia, Gastroesophageal reflux disease with esophagitis, and Chronic breast pain were also pertinent to this visit.  HPI Sabrina [name] presents for follow up on multiple issues today,  She has many complaints today  States that she had a "Bad" headache on Sunday that required her to leave the church. It did involve the back of the head and both temples.  Did not respond to two tramadol but resolved after taking an NSAID OTC .      Stomach pain..Feels nauseated once in a while, usually occurs after eating , onceor twice a week , but doesn't vomit. Feels she needs to burp but has difficulty having a good burp,  Denies dysphagia,  Weight loss, changes in stool consistency, color or elimination schedule.  takng nexium in the morning but not 30 minutess before dinner.  Bowels move daily   Left breast tender for the past month,  Remote history of blunt trauma to breast as an adolescent,  Has been tender ever since,  Thinks she feels a pea sized lump.  Last mammogram 2016.  Has had occasional clear vaginal discharge. No odor,  No pain .    Back pain, chronic .  No change in radicular pattern or intensity.  Using fentanyl patch and tramadol for pain control. She has not had any ER visits  And has not requested any early refills.  Her Refill history was confirmed via [place] Controlled Substance database by me today during her visit and there have been no prescriptions of controlled substances filled from any providers other than me. .     .   Outpatient Medications Prior to Visit  Medication Sig Dispense Refill  . amLODipine (NORVASC) 10 MG tablet TAKE ONE TABLET BY MOUTH ONCE DAILY 90 tablet 1  . aspirin 81 MG tablet Take 81 mg by mouth daily.    . baclofen (LIORESAL) 10 MG tablet TAKE ONE  TABLET BY MOUTH THREE TIMES DAILY AS NEEDED FOR MUSCLE SPASM 90 tablet 5  . chlordiazePOXIDE (LIBRIUM) 25 MG capsule TAKE ONE CAPSULE BY MOUTH ONCE DAILY AS NEEDED FOR ANXIETY 30 capsule 5  . clopidogrel (PLAVIX) 75 MG tablet TAKE ONE TABLET BY MOUTH ONCE DAILY 90 tablet 2  . fentaNYL (DURAGESIC) 75 MCG/HR Place 1 patch (75 mcg total) onto the skin every 3 (three) days. 10 patch 0  . hydrALAZINE (APRESOLINE) 50 MG tablet TAKE ONE TABLET BY MOUTH THREE TIMES DAILY AS NEEDED 270 tablet 1  . labetalol (NORMODYNE) 200 MG tablet Take 0.5 tablets (100 mg total) by mouth 2 (two) times daily. 90 tablet 2  . labetalol (NORMODYNE) 200 MG tablet TAKE ONE-HALF TABLET BY MOUTH TWICE DAILY 90 tablet 2  . NEXIUM 40 MG capsule TAKE ONE CAPSULE BY MOUTH ONCE DAILY 90 capsule 3  . Omega-3 Fatty Acids (FISH OIL) 1000 MG CAPS Take 1 capsule by mouth daily.    . traMADol (ULTRAM) 50 MG tablet TAKE TWO TABLETS BY MOUTH EVERY 8 HOURS AS NEEDED 180 tablet 3  . ZETIA 10 MG tablet Take 1 tablet (10 mg total) by mouth daily. 30 tablet 5  . ZETIA 10 MG tablet Take 1 tablet (10 mg total) by mouth daily. 30 tablet 5  . lisinopril (PRINIVIL,ZESTRIL) 40 MG tablet TAKE ONE TABLET BY MOUTH ONCE DAILY 90 tablet  1   No facility-administered medications prior to visit.     Review of Systems;  Patient denies , fevers, malaise, unintentional weight loss, skin rash, eye pain, sinus congestion and sinus pain, sore throat, dysphagia,  hemoptysis , cough, dyspnea, wheezing, chest pain, palpitations, orthopnea, edema,  melena, diarrhea, constipation, flank pain, dysuria, hematuria, urinary  Frequency, nocturia, numbness, tingling, seizures,  Focal weakness, Loss of consciousness,  Tremor, insomnia, depression, anxiety, and suicidal ideation.      Objective:  BP (!) 150/78   Pulse 69   Temp 98.1 F (36.7 C) (Oral)   Wt 159 lb (72.1 kg)   SpO2 95%   BMI 31.05 kg/m   BP Readings from Last 3 Encounters:  12/13/16 (!) 150/78    09/22/16 (!) 142/70  09/13/16 (!) 180/80    Wt Readings from Last 3 Encounters:  12/13/16 159 lb (72.1 kg)  09/22/16 158 lb (71.7 kg)  09/13/16 156 lb 12 oz (71.1 kg)   .General appearance: alert, cooperative and appears stated age Head: Normocephalic, without obvious abnormality, atraumatic Eyes: conjunctivae/corneas clear. PERRL, EOM's intact. Fundi benign. Ears: normal TM's and external ear canals both ears Nose: Nares normal. Septum midline. Mucosa normal. No drainage or sinus tenderness. Throat: lips, mucosa, and tongue normal; teeth and gums normal Neck: no adenopathy, no carotid bruit, no JVD, supple, symmetrical, trachea midline and thyroid not enlarged, symmetric, no tenderness/mass/nodules Lungs: clear to auscultation bilaterally Breasts: normal appearance, no masses or tenderness Heart: regular rate and rhythm, S1, S2 normal, no murmur, click, rub or gallop Abdomen: soft, non-tender; bowel sounds normal; no masses,  no organomegaly Extremities: extremities normal, atraumatic, no cyanosis or edema Pulses: 2+ and symmetric Skin: Skin color, texture, turgor normal. No rashes or lesions Neurologic: Alert and oriented X 3, normal strength and tone. Normal symmetric reflexes. Normal coordination and gait.    Lab Results  Component Value Date   HGBA1C 5.9 07/19/2016   HGBA1C 5.8 06/02/2015    Lab Results  Component Value Date   CREATININE 1.15 07/19/2016   CREATININE 1.09 06/01/2016   CREATININE 1.02 03/05/2016    Lab Results  Component Value Date   WBC 5.3 06/01/2016   HGB 11.8 (L) 06/01/2016   HCT 35.1 (L) 06/01/2016   PLT 258.0 06/01/2016   GLUCOSE 93 07/19/2016   CHOL 167 07/19/2016   TRIG 55.0 07/19/2016   HDL 57.90 07/19/2016   LDLDIRECT 109.0 03/05/2016   LDLCALC 98 07/19/2016   ALT 11 07/19/2016   AST 14 07/19/2016   NA 138 07/19/2016   K 4.8 07/19/2016   CL 100 07/19/2016   CREATININE 1.15 07/19/2016   BUN 16 07/19/2016   CO2 33 (H) 07/19/2016    TSH 4.440 09/02/2016   INR 1.0 03/27/2014   HGBA1C 5.9 07/19/2016    No results found.  Assessment & Plan:   Problem List Items Addressed This Visit    Cervicalgia    She continues to defer neurosurgical evaluation .  She has no radiculopathy to hands,  Only occipital headaches.       Chronic breast pain    Bilateral Breast exam done and no masses appreciated,  3d mammogram ordered.       GERD (gastroesophageal reflux disease)    Advised to take nexium in the am 30 minutes prior to meals. Trial of hyoscyamine for presumed spasm.   If symptoms continue,  Barium swallow to evaluate for esophageal stricute, dysmotility and PUD.       Relevant Medications  hyoscyamine (LEVSIN SL) 0.125 MG SL tablet   HLD (hyperlipidemia)    Continue Zetia for PAD with history of carotid and renal artery stenosis and documented statin intolerance. PA is process      Relevant Medications   lisinopril (PRINIVIL,ZESTRIL) 20 MG tablet    Other Visit Diagnoses    Breast cancer screening    -  Primary   Relevant Orders   MM SCREENING BREAST TOMO BILATERAL      I have changed Ms. Bilal's lisinopril. I am also having her start on hyoscyamine. Additionally, I am having her maintain her aspirin, Fish Oil, labetalol, NEXIUM, traMADol, hydrALAZINE, chlordiazePOXIDE, amLODipine, labetalol, ZETIA, baclofen, ZETIA, clopidogrel, and fentaNYL.  Meds ordered this encounter  Medications  . lisinopril (PRINIVIL,ZESTRIL) 20 MG tablet    Sig: Take 2 tablets (40 mg total) by mouth daily.    Dispense:  180 tablet    Refill:  1  . hyoscyamine (LEVSIN SL) 0.125 MG SL tablet    Sig: Place 1 tablet (0.125 mg total) under the tongue every 4 (four) hours as needed.    Dispense:  30 tablet    Refill:  0    Medications Discontinued During This Encounter  Medication Reason  . lisinopril (PRINIVIL,ZESTRIL) 40 MG tablet Reorder    Follow-up: No Follow-up on file.   Crecencio Mc, MD

## 2016-12-13 NOTE — Patient Instructions (Addendum)
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

## 2016-12-13 NOTE — Progress Notes (Signed)
Pre visit review using our clinic review tool, if applicable. No additional management support is needed unless otherwise documented below in the visit note. 

## 2016-12-14 DIAGNOSIS — N644 Mastodynia: Secondary | ICD-10-CM

## 2016-12-14 DIAGNOSIS — G8929 Other chronic pain: Secondary | ICD-10-CM | POA: Insufficient documentation

## 2016-12-14 NOTE — Assessment & Plan Note (Signed)
Bilateral Breast exam done and no masses appreciated,  3d mammogram ordered.

## 2016-12-14 NOTE — Assessment & Plan Note (Signed)
Advised to take nexium in the am 30 minutes prior to meals. Trial of hyoscyamine for presumed spasm.   If symptoms continue,  Barium swallow to evaluate for esophageal stricute, dysmotility and PUD.

## 2016-12-14 NOTE — Assessment & Plan Note (Signed)
Continue Zetia for PAD with history of carotid and renal artery stenosis and documented statin intolerance. PA is process

## 2016-12-14 NOTE — Assessment & Plan Note (Signed)
She continues to defer neurosurgical evaluation .  She has no radiculopathy to hands,  Only occipital headaches.

## 2016-12-16 ENCOUNTER — Telehealth: Payer: Self-pay | Admitting: Internal Medicine

## 2016-12-16 NOTE — Telephone Encounter (Signed)
Completed PA form signed by MD and faxed to number on form.

## 2016-12-16 NOTE — Telephone Encounter (Signed)
PA paperwork was given to [facility] for medication of Fentinyl.

## 2016-12-17 NOTE — Telephone Encounter (Signed)
The form completed was for a tier exception so patient could reeive medication for $3.00 instead of $11.97 Confirmed patient has patches at this time.

## 2016-12-23 ENCOUNTER — Telehealth: Payer: Self-pay | Admitting: Internal Medicine

## 2016-12-23 ENCOUNTER — Encounter: Payer: Self-pay | Admitting: Internal Medicine

## 2016-12-23 MED ORDER — SIMVASTATIN 10 MG PO TABS: 10.0000 mg | ORAL_TABLET | Freq: Every day | ORAL | 3 refills | Status: DC

## 2016-12-23 NOTE — Telephone Encounter (Signed)
Pt called stating that she cannot afford ZETIA 10 MG tablet they went up to 51.00 for 30pills.   Pt wants to know if she can have a Rx for Simvastatin 10 mg, she states it will make her sick but is willing to try again to take them. Thank you!  Pharmacy is [id number] - [place] ([address]), [place] [address].  Please call pt when the Rx is ready @ [phone].

## 2016-12-23 NOTE — Telephone Encounter (Signed)
Spoke with pt and informed her that the medication had been changed and sent in to the pharmacy she suggested.

## 2016-12-23 NOTE — Telephone Encounter (Signed)
Done

## 2017-01-05 ENCOUNTER — Other Ambulatory Visit: Payer: Self-pay | Admitting: Internal Medicine

## 2017-01-05 MED ORDER — FENTANYL 75 MCG/HR TD PT72: 75.0000 ug | MEDICATED_PATCH | TRANSDERMAL | 0 refills | Status: DC

## 2017-01-05 NOTE — Telephone Encounter (Signed)
Please print the refill I authorized on the fentanyl

## 2017-01-05 NOTE — Telephone Encounter (Signed)
lov 12/03/16

## 2017-01-05 NOTE — Telephone Encounter (Signed)
Pt is requesting a refill on her fentaNYL (DURAGESIC) 75 MCG/HR. She has her last one one now. Please call when completed. Thank you!  [place] [place] - [place]  Call pt @ [phone]

## 2017-01-07 ENCOUNTER — Other Ambulatory Visit: Payer: Self-pay | Admitting: Internal Medicine

## 2017-01-07 MED ORDER — FENTANYL 75 MCG/HR TD PT72: 75.0000 ug | MEDICATED_PATCH | TRANSDERMAL | 0 refills | Status: DC

## 2017-01-07 NOTE — Telephone Encounter (Signed)
rx has been printed, signed and put up front for pt to come by and pick up. Pt has been notified.

## 2017-01-07 NOTE — Telephone Encounter (Signed)
Last office visit 12/13/16 Next office visit 09/22/17

## 2017-01-07 NOTE — Telephone Encounter (Signed)
traMADOL FAXED

## 2017-01-19 ENCOUNTER — Ambulatory Visit
Admission: RE | Admit: 2017-01-19 | Discharge: 2017-01-19 | Disposition: A | Discharge status: Home / Self Care | Payer: Medicare Other | Source: Ambulatory Visit | Attending: Internal Medicine | Admitting: Internal Medicine

## 2017-01-19 DIAGNOSIS — Z1231 Encounter for screening mammogram for malignant neoplasm of breast: Secondary | ICD-10-CM | POA: Insufficient documentation

## 2017-01-19 DIAGNOSIS — Z1239 Encounter for other screening for malignant neoplasm of breast: Secondary | ICD-10-CM

## 2017-01-26 ENCOUNTER — Other Ambulatory Visit: Payer: Self-pay | Admitting: Internal Medicine

## 2017-02-07 ENCOUNTER — Emergency Department
Admission: EM | Admit: 2017-02-07 | Discharge: 2017-02-07 | Disposition: A | Discharge status: Home / Self Care | Payer: Medicare Other | Attending: Emergency Medicine | Admitting: Emergency Medicine

## 2017-02-07 ENCOUNTER — Encounter: Payer: Self-pay | Admitting: Emergency Medicine

## 2017-02-07 ENCOUNTER — Telehealth: Payer: Self-pay | Admitting: Internal Medicine

## 2017-02-07 DIAGNOSIS — T466X5A Adverse effect of antihyperlipidemic and antiarteriosclerotic drugs, initial encounter: Secondary | ICD-10-CM | POA: Insufficient documentation

## 2017-02-07 DIAGNOSIS — I1310 Hypertensive heart and chronic kidney disease without heart failure, with stage 1 through stage 4 chronic kidney disease, or unspecified chronic kidney disease: Secondary | ICD-10-CM | POA: Diagnosis not present

## 2017-02-07 DIAGNOSIS — R252 Cramp and spasm: Secondary | ICD-10-CM | POA: Insufficient documentation

## 2017-02-07 DIAGNOSIS — N183 Chronic kidney disease, stage 3 (moderate): Secondary | ICD-10-CM | POA: Insufficient documentation

## 2017-02-07 DIAGNOSIS — I2510 Atherosclerotic heart disease of native coronary artery without angina pectoris: Secondary | ICD-10-CM | POA: Insufficient documentation

## 2017-02-07 DIAGNOSIS — Z789 Other specified health status: Secondary | ICD-10-CM

## 2017-02-07 DIAGNOSIS — Z87891 Personal history of nicotine dependence: Secondary | ICD-10-CM | POA: Diagnosis not present

## 2017-02-07 DIAGNOSIS — Z7982 Long term (current) use of aspirin: Secondary | ICD-10-CM | POA: Insufficient documentation

## 2017-02-07 DIAGNOSIS — R110 Nausea: Secondary | ICD-10-CM | POA: Diagnosis not present

## 2017-02-07 LAB — COMPREHENSIVE METABOLIC PANEL
ALT: 14 U/L (ref 14–54)
AST: 20 U/L (ref 15–41)
Albumin: 4.1 g/dL (ref 3.5–5.0)
Alkaline Phosphatase: 66 U/L (ref 38–126)
Anion gap: 6 (ref 5–15)
BUN: 16 mg/dL (ref 6–20)
CO2: 29 mmol/L (ref 22–32)
Calcium: 9.4 mg/dL (ref 8.9–10.3)
Chloride: 100 mmol/L — ABNORMAL LOW (ref 101–111)
Creatinine, Ser: 1.18 mg/dL — ABNORMAL HIGH (ref 0.44–1.00)
GFR calc Af Amer: 51 mL/min — ABNORMAL LOW (ref >60)
GFR calc non Af Amer: 44 mL/min — ABNORMAL LOW (ref >60)
Glucose, Bld: 89 mg/dL (ref 65–99)
Potassium: 4.1 mmol/L (ref 3.5–5.1)
Sodium: 135 mmol/L (ref 135–145)
Total Bilirubin: 0.6 mg/dL (ref 0.3–1.2)
Total Protein: 7.6 g/dL (ref 6.5–8.1)

## 2017-02-07 LAB — URINALYSIS, COMPLETE (UACMP) WITH MICROSCOPIC
Bacteria, UA: NONE SEEN
Bilirubin Urine: NEGATIVE
Glucose, UA: NEGATIVE mg/dL
Ketones, ur: NEGATIVE mg/dL
Nitrite: NEGATIVE
Protein, ur: NEGATIVE mg/dL
Specific Gravity, Urine: 1.01 (ref 1.005–1.030)
pH: 5.5 (ref 5.0–8.0)

## 2017-02-07 LAB — CBC WITH DIFFERENTIAL/PLATELET
Basophils Absolute: 0 10*3/uL (ref 0–0.1)
Basophils Relative: 1 %
Eosinophils Absolute: 0.5 10*3/uL (ref 0–0.7)
Eosinophils Relative: 8 %
HCT: 35.1 % (ref 35.0–47.0)
Hemoglobin: 11.9 g/dL — ABNORMAL LOW (ref 12.0–16.0)
Lymphocytes Relative: 32 %
Lymphs Abs: 2 10*3/uL (ref 1.0–3.6)
MCH: 29 pg (ref 26.0–34.0)
MCHC: 33.9 g/dL (ref 32.0–36.0)
MCV: 85.4 fL (ref 80.0–100.0)
Monocytes Absolute: 0.5 10*3/uL (ref 0.2–0.9)
Monocytes Relative: 9 %
Neutro Abs: 3.2 10*3/uL (ref 1.4–6.5)
Neutrophils Relative %: 50 %
Platelets: 259 10*3/uL (ref 150–440)
RBC: 4.11 MIL/uL (ref 3.80–5.20)
RDW: 13.6 % (ref 11.5–14.5)
WBC: 6.2 10*3/uL (ref 3.6–11.0)

## 2017-02-07 LAB — CK: Total CK: 162 U/L (ref 38–234)

## 2017-02-07 NOTE — Telephone Encounter (Signed)
Called patient and advised her to go to ED.  She doesn't have anyone to drive her , refused to call 911. Advised I would follow her chart.

## 2017-02-07 NOTE — Telephone Encounter (Signed)
Pt called c/o of not feeling well, pt thinks that it may be do to her new cholesterol medication. She is c/o sweating, fatigue, and nausea. Pt states that she would like to go back to Zetia. Please advise, thank you!  Call pt @ [phone]

## 2017-02-07 NOTE — Discharge Instructions (Signed)
Stop taking the statin and follow up with her doctor in one or 2 days. Return to the emergency room if you have chest pain, fever, shortness of breath, abdominal pain, if you pass out, or any new symptoms concerning to you.

## 2017-02-07 NOTE — Telephone Encounter (Signed)
Patient in ED now. 

## 2017-02-07 NOTE — ED Triage Notes (Signed)
States was on Zetia. Due to price MD switched her to simvistatin in March. States has had muscle cramps, nausea and fatique since started.

## 2017-02-07 NOTE — Telephone Encounter (Signed)
I would prefer that she go to the ER because her symptoms could be unstable angina.  If they determine that it is not angina, then I will agree that it could be medication

## 2017-02-07 NOTE — ED Provider Notes (Signed)
[facility] Emergency Department Provider Note  ____________________________________________  Time seen: Approximately 6:02 PM  I have reviewed the triage vital signs and the nursing notes.   HISTORY  Chief Complaint Muscle Pain   HPI Sabrina [name] is a 76 y.o. female with a history of CAD status post CABG, CKD, anemia of chronic illness, hypertension, hyperlipidemia who presents for evaluation of muscle cramping. Patient reports that she's been having symptoms for 4 weeks. She is supposed to be on Zetia for HLD since she had allergic reaction to statins. She tells me Zetia is very expensive and on 3/1 she decided to give simvastatin another try. Two weeks after starting the stain she began to have muscle cramping, hot and cold sweats, nausea, and fatigue which were the exact same symptoms she had had previously on simvastatin. She kept taking it because she was hoping she would get used to it and the symptoms would go away. Today, she called her PCP and told her what was going on and PCP told her to the ED for evaluation. Patient denies fever, headache, neck stiffness, chest pain, shortness of breath, cough, congestion, sore throat, vomiting, diarrhea, abdominal pain, dysuria.  Past Medical History:  Diagnosis Date  . Anemia of chronic kidney failure   . Anxiety   . CAD (coronary artery disease)   . Carotid artery stenosis   . Chronic kidney disease, stage III (moderate)    Followed by Dr. Juleen China  . Hyperlipidemia   . Hypertension   . Renal artery stenosis ([place])   . 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 Chronic breast pain 12/14/2016  . Atherosclerosis of renal artery ([place]) 09/09/2016  . Carotid stenosis 09/09/2016  . Palpitations 06/01/2016  . Cervicalgia 12/04/2015  . Cervical spondylosis  without myelopathy 08/20/2015  . Recurrent occipital headache 07/20/2015  . Statin intolerance 05/20/2015  . GERD (gastroesophageal reflux disease) 12/06/2014  . Chronic right hip pain 11/14/2014  . Generalized anxiety disorder 09/08/2014  . 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 Peripheral vascular disease ([place]) 08/03/2014  . CAD (coronary artery disease) 08/03/2014  . HLD (hyperlipidemia) 06/24/2014  . Chronic narcotic use 06/24/2014  . Need for prophylactic vaccination and inoculation against influenza 06/24/2014  . Need for vaccination with 13-polyvalent pneumococcal conjugate vaccine 12/28/2013  . Aortic systolic murmur on examination 12/28/2013  . Hip pain, bilateral 12/27/2013  . Lumbosacral radiculitis 12/27/2013  . HTN (hypertension) 12/27/2013    Past Surgical History:  Procedure Laterality Date  . ABDOMINAL HYSTERECTOMY  1976  . CAROTID ARTERY ANGIOPLASTY Left   . CAROTID ENDARTERECTOMY Left   . CHOLECYSTECTOMY    . CORONARY ANGIOPLASTY WITH STENT PLACEMENT  2000  . CORONARY ARTERY BYPASS GRAFT  2000  . CYSTOSCOPY WITH STENT PLACEMENT Bilateral   . RENAL ARTERY ANGIOPLASTY Bilateral mARCH 2015  . TOE AMPUTATION Right    small toe  . TONSILLECTOMY AND ADENOIDECTOMY    . TOTAL HIP ARTHROPLASTY Left   . TOTAL HIP ARTHROPLASTY Right 2015    Prior to Admission medications   Medication Sig Start Date End Date Taking? Authorizing Provider  amLODipine (NORVASC) 10 MG tablet TAKE ONE TABLET BY MOUTH ONCE DAILY 01/26/17   Crecencio Mc, MD  aspirin 81 MG tablet Take 81 mg by  mouth daily.    Historical Provider, MD  baclofen (LIORESAL) 10 MG tablet TAKE ONE TABLET BY MOUTH THREE TIMES DAILY AS NEEDED FOR MUSCLE SPASM 10/20/16   Crecencio Mc, MD  chlordiazePOXIDE (LIBRIUM) 25 MG capsule TAKE ONE CAPSULE BY MOUTH ONCE DAILY AS NEEDED FOR ANXIETY 07/09/16   Crecencio Mc, MD  clopidogrel (PLAVIX) 75 MG tablet  TAKE ONE TABLET BY MOUTH ONCE DAILY 11/12/16   Crecencio Mc, MD  fentaNYL (DURAGESIC) 75 MCG/HR Place 1 patch (75 mcg total) onto the skin every 3 (three) days. 01/07/17   Crecencio Mc, MD  hydrALAZINE (APRESOLINE) 50 MG tablet TAKE ONE TABLET BY MOUTH THREE TIMES DAILY AS NEEDED 03/31/16   Crecencio Mc, MD  hyoscyamine (LEVSIN SL) 0.125 MG SL tablet Place 1 tablet (0.125 mg total) under the tongue every 4 (four) hours as needed. 12/13/16   Crecencio Mc, MD  labetalol (NORMODYNE) 200 MG tablet Take 0.5 tablets (100 mg total) by mouth 2 (two) times daily. 01/19/16   Crecencio Mc, MD  labetalol (NORMODYNE) 200 MG tablet TAKE ONE-HALF TABLET BY MOUTH TWICE DAILY 07/23/16   Crecencio Mc, MD  lisinopril (PRINIVIL,ZESTRIL) 20 MG tablet Take 2 tablets (40 mg total) by mouth daily. 12/13/16   Crecencio Mc, MD  NEXIUM 40 MG capsule TAKE ONE CAPSULE BY MOUTH ONCE DAILY 03/23/16   Crecencio Mc, MD  Omega-3 Fatty Acids (FISH OIL) 1000 MG CAPS Take 1 capsule by mouth daily.    Historical Provider, MD  simvastatin (ZOCOR) 10 MG tablet Take 1 tablet (10 mg total) by mouth at bedtime. 12/23/16   Crecencio Mc, MD  traMADol (ULTRAM) 50 MG tablet TAKE TWO TABLETS BY MOUTH EVERY 8 HOURS AS NEEDED 01/07/17   Crecencio Mc, MD  ZETIA 10 MG tablet Take 1 tablet (10 mg total) by mouth daily. 09/10/16   Crecencio Mc, MD  ZETIA 10 MG tablet Take 1 tablet (10 mg total) by mouth daily. 10/19/16   Crecencio Mc, MD    Allergies Citalopram; Dilaudid [hydromorphone hcl]; Hydrochlorothiazide; Nsaids; Nubain [nalbuphine hcl]; Penicillins; and Statins  Family History  Problem Relation Age of Onset  . Stroke Mother   . Hypertension Mother   . Diabetes Mother   . Hypertension Father   . Heart disease Sister     MI  . Multiple sclerosis Daughter   . Multiple sclerosis Son   . Cerebral aneurysm Son   . Seizures Son   . Cerebral aneurysm Son   . Breast cancer Paternal Aunt 80    Social History Social History    Substance Use Topics  . Smoking status: Former Smoker    Quit date: 10/25/1998  . Smokeless tobacco: Never Used  . Alcohol use No    Review of Systems  Constitutional: Negative for fever. + fatigue and sweats Eyes: Negative for visual changes. ENT: Negative for sore throat. Neck: No neck pain  Cardiovascular: Negative for chest pain. Respiratory: Negative for shortness of breath. Gastrointestinal: Negative for abdominal pain, vomiting or diarrhea. + nausea Genitourinary: Negative for dysuria. Musculoskeletal: Negative for back pain. + muscle cramping Skin: Negative for rash. Neurological: Negative for headaches, weakness or numbness. Psych: No SI or HI  ____________________________________________   PHYSICAL EXAM:  VITAL SIGNS: ED Triage Vitals  Enc Vitals Group     BP 02/07/17 1609 (!) 195/69     Pulse Rate 02/07/17 1609 76     Resp 02/07/17 1609 20  Temp 02/07/17 1609 99.1 F (37.3 C)     Temp Source 02/07/17 1609 Oral     SpO2 02/07/17 1609 98 %     Weight 02/07/17 1610 160 lb (72.6 kg)     Height 02/07/17 1610 5\' 1"  (1.549 m)     Head Circumference --      Peak Flow --      Pain Score --      Pain Loc --      Pain Edu? --      Excl. in [place]? --     Constitutional: Alert and oriented. Well appearing and in no apparent distress. HEENT:      Head: Normocephalic and atraumatic.         Eyes: Conjunctivae are normal. Sclera is non-icteric. EOMI. PERRL      Mouth/Throat: Mucous membranes are moist.       Neck: Supple with no signs of meningismus. Cardiovascular: Regular rate and rhythm. No murmurs, gallops, or rubs. 2+ symmetrical distal pulses are present in all extremities. No JVD. Respiratory: Normal respiratory effort. Lungs are clear to auscultation bilaterally. No wheezes, crackles, or rhonchi.  Gastrointestinal: Soft, non tender, and non distended with positive bowel sounds. No rebound or guarding. Genitourinary: No CVA tenderness. Musculoskeletal:  Nontender with normal range of motion in all extremities. No edema, cyanosis, or erythema of extremities. No ttp of muscles Neurologic: Normal speech and language. Face is symmetric. Moving all extremities. No gross focal neurologic deficits are appreciated. Skin: Skin is warm, dry and intact. No rash noted. Psychiatric: Mood and affect are normal. Speech and behavior are normal.  ____________________________________________   LABS (all labs ordered are listed, but only abnormal results are displayed)  Labs Reviewed  CBC WITH DIFFERENTIAL/PLATELET - Abnormal; Notable for the following:       Result Value   Hemoglobin 11.9 (*)    All other components within normal limits  COMPREHENSIVE METABOLIC PANEL - Abnormal; Notable for the following:    Chloride 100 (*)    Creatinine, Ser 1.18 (*)    GFR calc non Af Amer 44 (*)    GFR calc Af Amer 51 (*)    All other components within normal limits  CK  URINALYSIS, COMPLETE (UACMP) WITH MICROSCOPIC   ____________________________________________  EKG  none ____________________________________________  RADIOLOGY  none  ____________________________________________   PROCEDURES  Procedure(s) performed: None Procedures Critical Care performed:  None ____________________________________________   INITIAL IMPRESSION / ASSESSMENT AND PLAN / ED COURSE  76 y.o. female with a history of CAD status post CABG, CKD, anemia of chronic illness, hypertension, hyperlipidemia who presents for evaluation of muscle cramping, nausea, sweats, and fatigue consistent with intolerance to statins. Symptoms are ongoing for 4 weeks since starting back on the statin. Patient is extremely well-appearing with normal physical exam. Kidney function is at baseline with creatinine of 1.18, total CK is within normal limits at 162. Patient told to stop statin and discuss with her PCP any other options available for her HLD.      Pertinent labs & imaging results  that were available during my care of the patient were reviewed by me and considered in my medical decision making (see chart for details).    ____________________________________________   FINAL CLINICAL IMPRESSION(S) / ED DIAGNOSES  Final diagnoses:  Muscle cramp  Nausea  Statin intolerance      NEW MEDICATIONS STARTED DURING THIS VISIT:  New Prescriptions   No medications on file     Note:  This  document was prepared using Systems analyst and may include unintentional dictation errors.    Rudene Re, MD 02/07/17 [phone]

## 2017-02-07 NOTE — Telephone Encounter (Signed)
Reason for call: nausea , sweating  Symptoms: nausea , sweating , left breast discomfort  For awhile, thinks it related to medication, denies tightness in chest, no shortness of breath, no neck pain or jaw pain, denies numbness in arm  ,states she just doesn't feel good in general, cramps in legs   Duration 12/23/16 Medications: simvastatin  Wants to discontinue simvastatin, and go back on zetia Please advise .

## 2017-02-08 NOTE — Telephone Encounter (Signed)
Pt called and stated that she went to the ED as advised. When she was discharges they told her that she needed to make an appt in 2 days to see Dr. Derrel Nip. Please advise, thank you!  Call Pt @ [phone]

## 2017-02-08 NOTE — Telephone Encounter (Signed)
Yes

## 2017-02-08 NOTE — Telephone Encounter (Signed)
Is it ok to schedule pt in two days?

## 2017-02-09 NOTE — Telephone Encounter (Signed)
The only spots available are 11.30 tomorrow or 4: 30 on Friday?

## 2017-02-09 NOTE — Telephone Encounter (Signed)
Patient scheduled.

## 2017-02-09 NOTE — Telephone Encounter (Signed)
4:30 Friday

## 2017-02-11 ENCOUNTER — Encounter: Payer: Self-pay | Admitting: Internal Medicine

## 2017-02-11 ENCOUNTER — Ambulatory Visit (INDEPENDENT_AMBULATORY_CARE_PROVIDER_SITE_OTHER): Payer: Medicare Other | Admitting: Internal Medicine

## 2017-02-11 DIAGNOSIS — N644 Mastodynia: Secondary | ICD-10-CM | POA: Diagnosis not present

## 2017-02-11 DIAGNOSIS — F458 Other somatoform disorders: Secondary | ICD-10-CM

## 2017-02-11 DIAGNOSIS — G8929 Other chronic pain: Secondary | ICD-10-CM

## 2017-02-11 DIAGNOSIS — R51 Headache: Secondary | ICD-10-CM | POA: Diagnosis not present

## 2017-02-11 DIAGNOSIS — E782 Mixed hyperlipidemia: Secondary | ICD-10-CM | POA: Diagnosis not present

## 2017-02-11 DIAGNOSIS — R519 Headache, unspecified: Secondary | ICD-10-CM

## 2017-02-11 MED ORDER — FENTANYL 75 MCG/HR TD PT72: 75.0000 ug | MEDICATED_PATCH | TRANSDERMAL | 0 refills | Status: DC

## 2017-02-11 MED ORDER — BACLOFEN 10 MG PO TABS: 10.0000 mg | ORAL_TABLET | Freq: Three times a day (TID) | ORAL | 5 refills | Status: DC | PRN

## 2017-02-11 MED ORDER — NEXIUM 40 MG PO CPDR: 40.0000 mg | DELAYED_RELEASE_CAPSULE | Freq: Every day | ORAL | 3 refills | Status: DC

## 2017-02-11 MED ORDER — NORTRIPTYLINE HCL 10 MG PO CAPS: 10.0000 mg | ORAL_CAPSULE | Freq: Every day | ORAL | 5 refills | Status: DC

## 2017-02-11 NOTE — Progress Notes (Signed)
Pre visit review using our clinic review tool, if applicable. No additional management support is needed unless otherwise documented below in the visit note. 

## 2017-02-11 NOTE — Patient Instructions (Addendum)
Try resuming the Pamelor  1 tablet at bedtime to prevent headaches  If the baclofen helped your headaches and does not make your dizziness worse,  You resume it (muscle relaxer)    The dizziness may be a medication side effect , so I'm not sure I can resolve it     You can use Referesh,  Blink or Systane for dry eye  Dry Eye Dry eye, also called keratoconjunctivitis sicca, is dryness of the membranes surrounding the eye. It happens when there are not enough healthy, natural tears in the eyes. The eyes must remain moist at all times. A small amount of tears is constantly produced by the tear glands (lacrimal glands). These glands are located under the outside part of the upper eyelids. Dryness of the eyes can be a symptom of a variety of conditions, such as rheumatoid arthritis, lupus, or Sjgren syndrome. Dry eye may be mild to severe. What are the causes? This condition may be caused by:  Not making enough tears (aqueous tear-deficient dry eyes).  Tears evaporating from the eye too quickly (evaporative dry eyes). This is when there is an abnormality in the quality of your tears. This abnormality causes your tears to evaporate so quickly that the eye cannot be kept moist. What increases the risk? This condition is more likely to happen in:  Women, especially those who have gone through menopause.  People in dry climates.  People in dusty or smoky areas.  People who take certain medicines, such as:  Anti-allergy medicines (antihistamines).  Blood pressure medicines (antihypertensives).  Birth control pills (oral contraceptives).  Laxatives.  Tranquilizers. What are the signs or symptoms? Symptoms in the eyes may include:  Irritation.  Itchiness.  Redness.  Burning.  Inflammation of the eyelids.  Feeling as though something is stuck in the eye.  Light sensitivity.  Increased sensitivity and discomfort when wearing contact lenses, if this applies.  Vision that  varies throughout the day.  Occasional excessive tearing. How is this diagnosed? This condition is diagnosed based on your symptoms, your medical history, and an eye exam. Your health care provider may look at your eye using a microscope and may put dyes in your eye to check the health of the surface of your eye. You may have a tests, such as a test to evaluate your tear production (Schirmer test). During this test, a small strip of special paper is gently pressed into the inner corner of your eye. Your tear production is measured by how much of the paper is moistened by your tears during a set amount of time. You may be referred to a health care provider who specializes in eyes and eyesight (ophthalmologist). How is this treated? This condition is often treated at home. Your health care provider may recommend eye drops, which are also called artificial tears. If your condition is severe, treatment may include:  Prescription eye drops.  Over-the-counter or prescription ointments to moisten your eyes.  Minor surgery to block tears from going into your nose.  Medicines to reduce inflammation of the eyelids. Follow these instructions at home:  Take, use, or apply over-the-counter and prescription medicines only as told by your health care provider. This includes eye drops.  If directed, apply a warm compress to your eyes to help reduce inflammation. Place a towel over your eyes and gently press the warm compress over your eyes for about 5 minutes, or as long as told by your health care provider.  If possible, avoid dry,  drafty environments.  Use a humidifier at home to increase moisture in the air.  If you wear contact lenses, remove them regularly to give your eyes a break. Always remove contacts before sleeping.  Keep all follow-up visits as told by your health care provider. This is important. This includes yearly eye exams and vision tests. Contact a health care provider if:  You  have eye pain.  You have pus-like fluid coming from your eye.  Your symptoms get worse or do not improve with treatment. Get help right away if:  Your vision suddenly changes. This information is not intended to replace advice given to you by your health care provider. Make sure you discuss any questions you have with your health care provider. Document Released: 08/28/2004 Document Revised: 03/18/2016 Document Reviewed: 08/06/2015 Elsevier Interactive Patient Education  2017 [employer].

## 2017-02-11 NOTE — Progress Notes (Signed)
Subjective:  Patient ID: Sabrina [name], female    DOB: 12-15-1940  Age: 76 y.o. MRN: [id number]  CC: Diagnoses of Mixed hyperlipidemia, Recurrent occipital headache, Chronic breast pain, and Dizziness, psychogenic were pertinent to this visit.  HPI Airica A Reining presents for er follow up on multiple symptoms.  She was referred to ED  on April 16 in ED for  3 week history of dizziness,  Headache,  muscle cramps, left sided chest pain, hot flashes, and nausea . UA, ck, and all other labs were normal .  She has attributed her symptoms to the trial of simvastatin she has been taking since her insurance refused to cover Zetia.  She has stopped the simvastatin since April 16.  Has filled the zetia   Going to cost her $51 monthly   Out of nexium .   bp has been normal until today,  Took a hydralazine in the parking lot   Feels constantly dizzy,, back pain ,  still "terrible"  Yet Still driving.  "feel s like I'm going to fall."  Denies vertigo.   Outpatient Medications Prior to Visit  Medication Sig Dispense Refill  . amLODipine (NORVASC) 10 MG tablet TAKE ONE TABLET BY MOUTH ONCE DAILY 90 tablet 0  . aspirin 81 MG tablet Take 81 mg by mouth daily.    . chlordiazePOXIDE (LIBRIUM) 25 MG capsule TAKE ONE CAPSULE BY MOUTH ONCE DAILY AS NEEDED FOR ANXIETY 30 capsule 5  . clopidogrel (PLAVIX) 75 MG tablet TAKE ONE TABLET BY MOUTH ONCE DAILY 90 tablet 2  . hydrALAZINE (APRESOLINE) 50 MG tablet TAKE ONE TABLET BY MOUTH THREE TIMES DAILY AS NEEDED 270 tablet 1  . hyoscyamine (LEVSIN SL) 0.125 MG SL tablet Place 1 tablet (0.125 mg total) under the tongue every 4 (four) hours as needed. 30 tablet 0  . labetalol (NORMODYNE) 200 MG tablet Take 0.5 tablets (100 mg total) by mouth 2 (two) times daily. 90 tablet 2  . labetalol (NORMODYNE) 200 MG tablet TAKE ONE-HALF TABLET BY MOUTH TWICE DAILY 90 tablet 2  . lisinopril (PRINIVIL,ZESTRIL) 20 MG tablet Take 2 tablets (40 mg total) by mouth daily. 180  tablet 1  . Omega-3 Fatty Acids (FISH OIL) 1000 MG CAPS Take 1 capsule by mouth daily.    . traMADol (ULTRAM) 50 MG tablet TAKE TWO TABLETS BY MOUTH EVERY 8 HOURS AS NEEDED 180 tablet 3  . ZETIA 10 MG tablet Take 1 tablet (10 mg total) by mouth daily. 30 tablet 5  . ZETIA 10 MG tablet Take 1 tablet (10 mg total) by mouth daily. 30 tablet 5  . baclofen (LIORESAL) 10 MG tablet TAKE ONE TABLET BY MOUTH THREE TIMES DAILY AS NEEDED FOR MUSCLE SPASM 90 tablet 5  . fentaNYL (DURAGESIC) 75 MCG/HR Place 1 patch (75 mcg total) onto the skin every 3 (three) days. 10 patch 0  . NEXIUM 40 MG capsule TAKE ONE CAPSULE BY MOUTH ONCE DAILY 90 capsule 3  . simvastatin (ZOCOR) 10 MG tablet Take 1 tablet (10 mg total) by mouth at bedtime. (Patient not taking: Reported on 02/11/2017) 90 tablet 3   No facility-administered medications prior to visit.     Review of Systems;  Patient denies , fevers, , unintentional weight loss, skin rash, eye pain, sinus congestion and sinus pain, sore throat, dysphagia,  hemoptysis , cough, dyspnea, wheezing, chest pain, palpitations, orthopnea, edema, abdominal pain, melena, diarrhea, constipation, flank pain, dysuria, hematuria, urinary  Frequency, nocturia, numbness, tingling, seizures,  Focal  weakness, Loss of consciousness,  Tremor, insomnia, depression, anxiety, and suicidal ideation.      Objective:  BP (!) 166/88 (BP Location: Left Arm, Patient Position: Sitting, Cuff Size: Normal)   Pulse 68   Temp 98.3 F (36.8 C) (Oral)   Resp 16   Wt 162 lb 8 oz (73.7 kg)   SpO2 96%   BMI 30.70 kg/m   BP Readings from Last 3 Encounters:  02/11/17 (!) 166/88  02/07/17 (!) 148/75  12/13/16 (!) 150/78    Wt Readings from Last 3 Encounters:  02/11/17 162 lb 8 oz (73.7 kg)  02/07/17 160 lb (72.6 kg)  12/13/16 159 lb (72.1 kg)    General appearance: alert, cooperative and appears stated age Ears: normal TM's and external ear canals both ears Throat: lips, mucosa, and  tongue normal; teeth and gums normal Neck: no adenopathy, no carotid bruit, supple, symmetrical, trachea midline and thyroid not enlarged, symmetric, no tenderness/mass/nodules Back: symmetric, no curvature. ROM normal. No CVA tenderness. Lungs: clear to auscultation bilaterally Heart: regular rate and rhythm, S1, S2 normal, no murmur, click, rub or gallop Abdomen: soft, non-tender; bowel sounds normal; no masses,  no organomegaly Pulses: 2+ and symmetric Skin: Skin color, texture, turgor normal. No rashes or lesions Lymph nodes: Cervical, supraclavicular, and axillary nodes normal.  Lab Results  Component Value Date   HGBA1C 5.9 07/19/2016   HGBA1C 5.8 06/02/2015    Lab Results  Component Value Date   CREATININE 1.18 (H) 02/07/2017   CREATININE 1.15 07/19/2016   CREATININE 1.09 06/01/2016    Lab Results  Component Value Date   WBC 6.2 02/07/2017   HGB 11.9 (L) 02/07/2017   HCT 35.1 02/07/2017   PLT 259 02/07/2017   GLUCOSE 89 02/07/2017   CHOL 167 07/19/2016   TRIG 55.0 07/19/2016   HDL 57.90 07/19/2016   LDLDIRECT 109.0 03/05/2016   LDLCALC 98 07/19/2016   ALT 14 02/07/2017   AST 20 02/07/2017   NA 135 02/07/2017   K 4.1 02/07/2017   CL 100 (L) 02/07/2017   CREATININE 1.18 (H) 02/07/2017   BUN 16 02/07/2017   CO2 29 02/07/2017   TSH 4.440 09/02/2016   INR 1.0 03/27/2014   HGBA1C 5.9 07/19/2016    No results found.  Assessment & Plan:   Problem List Items Addressed This Visit    Chronic breast pain    Etiology unclear.  Patient is up to date on mammograms,  Pain is likely referred from thoracic spine.       Dizziness, psychogenic    She is not orthostatic or having vertigo.  She has PAD and DJD cervical spine . Likely medication side effect       HLD (hyperlipidemia)    She did not tolerate trial of simvastatin due to nausea, and has paid out of pocket for Zetia.        Recurrent occipital headache    She was referred to Neurology for management of  persistent headache last October.  She did not continue the pamelor because the higher dose was not tolerated.  suggested she resume the pamelor at a lower dose       Relevant Medications   baclofen (LIORESAL) 10 MG tablet   nortriptyline (PAMELOR) 10 MG capsule   fentaNYL (DURAGESIC) 75 MCG/HR      I have discontinued Ms. Distler's simvastatin. I have also changed her [place] and baclofen. Additionally, I am having her start on nortriptyline. Lastly, I am having her maintain her  aspirin, Fish Oil, labetalol, hydrALAZINE, chlordiazePOXIDE, labetalol, ZETIA, ZETIA, clopidogrel, lisinopril, hyoscyamine, traMADol, amLODipine, and fentaNYL.  Meds ordered this encounter  Medications  . NEXIUM 40 MG capsule    Sig: Take 1 capsule (40 mg total) by mouth daily.    Dispense:  90 capsule    Refill:  3  . baclofen (LIORESAL) 10 MG tablet    Sig: Take 1 tablet (10 mg total) by mouth 3 (three) times daily as needed. for muscle spams    Dispense:  90 tablet    Refill:  5    Please consider 90 day supplies to promote better adherence  . nortriptyline (PAMELOR) 10 MG capsule    Sig: Take 1 capsule (10 mg total) by mouth at bedtime.    Dispense:  30 capsule    Refill:  5  . DISCONTD: fentaNYL (DURAGESIC) 75 MCG/HR    Sig: Place 1 patch (75 mcg total) onto the skin every 3 (three) days.    Dispense:  10 patch    Refill:  0  . DISCONTD: fentaNYL (DURAGESIC) 75 MCG/HR    Sig: Place 1 patch (75 mcg total) onto the skin every 3 (three) days.    Dispense:  10 patch    Refill:  0    May refill on or after Mar 09 2017  . fentaNYL (DURAGESIC) 75 MCG/HR    Sig: Place 1 patch (75 mcg total) onto the skin every 3 (three) days.    Dispense:  10 patch    Refill:  0    May refill on or after April 09 2017   A total of 40 minutes was spent with patient more than half of which was spent in counseling patient on the above mentioned issues , reviewing and explaining recent labs and imaging studies done, and  coordination of care.  Medications Discontinued During This Encounter  Medication Reason  . NEXIUM 40 MG capsule Reorder  . baclofen (LIORESAL) 10 MG tablet Reorder  . simvastatin (ZOCOR) 10 MG tablet   . fentaNYL (DURAGESIC) 75 MCG/HR Reorder  . fentaNYL (DURAGESIC) 75 MCG/HR Reorder  . fentaNYL (DURAGESIC) 75 MCG/HR Reorder    Follow-up: No Follow-up on file.   Crecencio Mc, MD

## 2017-02-13 DIAGNOSIS — F458 Other somatoform disorders: Secondary | ICD-10-CM | POA: Insufficient documentation

## 2017-02-13 NOTE — Assessment & Plan Note (Signed)
She is not orthostatic or having vertigo.  She has PAD and DJD cervical spine . Likely medication side effect

## 2017-02-13 NOTE — Assessment & Plan Note (Signed)
She did not tolerate trial of simvastatin due to nausea, and has paid out of pocket for Zetia.

## 2017-02-13 NOTE — Assessment & Plan Note (Signed)
She was referred to Neurology for management of persistent headache last October.  She did not continue the pamelor because the higher dose was not tolerated.  suggested she resume the pamelor at a lower dose

## 2017-02-13 NOTE — Assessment & Plan Note (Signed)
Etiology unclear.  Patient is up to date on mammograms,  Pain is likely referred from thoracic spine.

## 2017-03-28 ENCOUNTER — Telehealth: Payer: Self-pay

## 2017-03-28 MED ORDER — CHLORDIAZEPOXIDE HCL 25 MG PO CAPS: ORAL_CAPSULE | ORAL | 5 refills | Status: DC

## 2017-03-28 NOTE — Telephone Encounter (Signed)
Noted thanks °

## 2017-03-28 NOTE — Addendum Note (Signed)
Addended by: Crecencio Mc on: 03/28/2017 12:27 PM   Modules accepted: Orders

## 2017-03-28 NOTE — Telephone Encounter (Signed)
Received a request from the pharmacy for Librium 25mg  cap, take one capsule by mouth daily as needed for anxiety.  This is not on her medication list, please advise, thanks

## 2017-03-28 NOTE — Telephone Encounter (Signed)
Medication is definitely on her medication list (generic chlordiazepoxide 25 mg ) . Refill authorized

## 2017-03-28 NOTE — Telephone Encounter (Signed)
Pt called to follow up on medication. Thank you!

## 2017-03-28 NOTE — Telephone Encounter (Signed)
I have sent to PCP to review, thanks

## 2017-04-11 ENCOUNTER — Ambulatory Visit (INDEPENDENT_AMBULATORY_CARE_PROVIDER_SITE_OTHER): Payer: Medicare Other | Admitting: Internal Medicine

## 2017-04-11 ENCOUNTER — Encounter: Payer: Self-pay | Admitting: Internal Medicine

## 2017-04-11 DIAGNOSIS — R519 Headache, unspecified: Secondary | ICD-10-CM

## 2017-04-11 DIAGNOSIS — R1013 Epigastric pain: Secondary | ICD-10-CM | POA: Diagnosis not present

## 2017-04-11 DIAGNOSIS — F458 Other somatoform disorders: Secondary | ICD-10-CM | POA: Diagnosis not present

## 2017-04-11 DIAGNOSIS — R51 Headache: Secondary | ICD-10-CM

## 2017-04-11 DIAGNOSIS — F1190 Opioid use, unspecified, uncomplicated: Secondary | ICD-10-CM | POA: Diagnosis not present

## 2017-04-11 DIAGNOSIS — M47812 Spondylosis without myelopathy or radiculopathy, cervical region: Secondary | ICD-10-CM

## 2017-04-11 MED ORDER — FENTANYL 75 MCG/HR TD PT72: 75.0000 ug | MEDICATED_PATCH | TRANSDERMAL | 0 refills | Status: DC

## 2017-04-11 MED ORDER — DICYCLOMINE HCL 10 MG/5ML PO SOLN: 10.0000 mg | Freq: Three times a day (TID) | ORAL | 12 refills | Status: DC

## 2017-04-11 NOTE — Progress Notes (Signed)
Subjective:  Patient ID: Sabrina [name], female    DOB: 05/02/1941  Age: 76 y.o. MRN: [id number]  CC: Diagnoses of Dizziness, psychogenic, Chronic narcotic use, Acute nonintractable headache, unspecified headache type, Cervical spondylosis without myelopathy, and Epigastric pain were pertinent to this visit.  HPI Sabrina [name] presents for follow up on multiple chronic complaints .  symptoms present for 1 week.   1) headache: AT TIMES  temporal, at times occipital location  Quality is very sharp lasts several seconds , Like a flash,  No vision changes, no vertigo.   2) epigastric pain .  Taking nexium on a daily basis 40 mg .  No nausea. . The pain is episodic   Not related to eating,  Dull in quality,  has a lot of gas. Has been present on a daily basis for the past week.  Moving bowels  every 2 days.  Takes laxatives infrequently for constipation ,  No prior EGD.  Discussed seeing GI . does not want to see GI for EGD  Has had a cholecystecomy.  Eats salad  Daily  Stools are brown .   3) low back pain, lower  Iliac crest  area and more on the right.  Aggravated by housework,  carrying groceries. 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    4) dizziness:  Chronic, episodic does not occur daily  Feels like she is going to fall often but does not. Denies vertigo  Has PAD, takes fentanyl and librium . Saw Neurology last October [facility] Nakayama)  Carotid US ordered,  Tramadol dc'd pamelor prescribed .  Stopped due to excessive sedation  ,    Outpatient Medications Prior to Visit  Medication Sig Dispense Refill  . amLODipine (NORVASC) 10 MG tablet TAKE ONE TABLET BY MOUTH ONCE DAILY 90 tablet 0  . aspirin 81 MG tablet Take 81 mg by mouth daily.    . baclofen (LIORESAL) 10 MG tablet Take 1 tablet (10 mg total) by mouth 3 (three) times daily as needed. for muscle spams 90 tablet 5  . chlordiazePOXIDE (LIBRIUM) 25 MG capsule TAKE ONE CAPSULE BY MOUTH ONCE DAILY AS NEEDED FOR ANXIETY  30 capsule 5  . clopidogrel (PLAVIX) 75 MG tablet TAKE ONE TABLET BY MOUTH ONCE DAILY 90 tablet 2  . hydrALAZINE (APRESOLINE) 50 MG tablet TAKE ONE TABLET BY MOUTH THREE TIMES DAILY AS NEEDED 270 tablet 1  . labetalol (NORMODYNE) 200 MG tablet TAKE ONE-HALF TABLET BY MOUTH TWICE DAILY 90 tablet 2  . lisinopril (PRINIVIL,ZESTRIL) 20 MG tablet Take 2 tablets (40 mg total) by mouth daily. 180 tablet 1  . NEXIUM 40 MG capsule Take 1 capsule (40 mg total) by mouth daily. 90 capsule 3  . Omega-3 Fatty Acids (FISH OIL) 1000 MG CAPS Take 1 capsule by mouth daily.    . traMADol (ULTRAM) 50 MG tablet TAKE TWO TABLETS BY MOUTH EVERY 8 HOURS AS NEEDED 180 tablet 3  . ZETIA 10 MG tablet Take 1 tablet (10 mg total) by mouth daily. 30 tablet 5  . fentaNYL (DURAGESIC) 75 MCG/HR Place 1 patch (75 mcg total) onto the skin every 3 (three) days. 10 patch 0  . hyoscyamine (LEVSIN SL) 0.125 MG SL tablet Place 1 tablet (0.125 mg total) under the tongue every 4 (four) hours as needed. 30 tablet 0  . labetalol (NORMODYNE) 200 MG tablet Take 0.5 tablets (100 mg total) by mouth 2 (two) times daily. 90 tablet 2  . nortriptyline (PAMELOR)  10 MG capsule Take 1 capsule (10 mg total) by mouth at bedtime. 30 capsule 5  . ZETIA 10 MG tablet Take 1 tablet (10 mg total) by mouth daily. 30 tablet 5   No facility-administered medications prior to visit.     Review of Systems;  Patient denies headache, fevers, malaise, unintentional weight loss, skin rash, eye pain, sinus congestion and sinus pain, sore throat, dysphagia,  hemoptysis , cough, dyspnea, wheezing, chest pain, palpitations, orthopnea, edema, abdominal pain, nausea, melena, diarrhea, constipation, flank pain, dysuria, hematuria, urinary  Frequency, nocturia, numbness, tingling, seizures,  Focal weakness, Loss of consciousness,  Tremor, insomnia, depression, anxiety, and suicidal ideation.      Objective:  BP (!) 158/76   Pulse 73   Temp 98.5 F (36.9 C) (Oral)    Resp 16   Ht 5\' 1"  (1.549 m)   Wt 157 lb 6 oz (71.4 kg)   SpO2 98%   BMI 29.74 kg/m   BP Readings from Last 3 Encounters:  04/11/17 (!) 158/76  02/11/17 (!) 166/88  02/07/17 (!) 148/75    Wt Readings from Last 3 Encounters:  04/11/17 157 lb 6 oz (71.4 kg)  02/11/17 162 lb 8 oz (73.7 kg)  02/07/17 160 lb (72.6 kg)    General appearance: alert, cooperative and appears stated age Ears: normal TM's and external ear canals both ears Throat: lips, mucosa, and tongue normal; teeth and gums normal Neck: no adenopathy, no carotid bruit, supple, symmetrical, trachea midline and thyroid not enlarged, symmetric, no tenderness/mass/nodules Back: symmetric, no curvature. ROM normal. No CVA tenderness. Lungs: clear to auscultation bilaterally Heart: regular rate and rhythm, S1, S2 normal, no murmur, click, rub or gallop Abdomen: soft, non-tender; bowel sounds normal; no masses,  no organomegaly Pulses: 2+ and symmetric Skin: Skin color, texture, turgor normal. No rashes or lesions Lymph nodes: Cervical, supraclavicular, and axillary nodes normal.  Lab Results  Component Value Date   HGBA1C 5.9 07/19/2016   HGBA1C 5.8 06/02/2015    Lab Results  Component Value Date   CREATININE 1.18 (H) 02/07/2017   CREATININE 1.15 07/19/2016   CREATININE 1.09 06/01/2016    Lab Results  Component Value Date   WBC 6.2 02/07/2017   HGB 11.9 (L) 02/07/2017   HCT 35.1 02/07/2017   PLT 259 02/07/2017   GLUCOSE 89 02/07/2017   CHOL 167 07/19/2016   TRIG 55.0 07/19/2016   HDL 57.90 07/19/2016   LDLDIRECT 109.0 03/05/2016   LDLCALC 98 07/19/2016   ALT 14 02/07/2017   AST 20 02/07/2017   NA 135 02/07/2017   K 4.1 02/07/2017   CL 100 (L) 02/07/2017   CREATININE 1.18 (H) 02/07/2017   BUN 16 02/07/2017   CO2 29 02/07/2017   TSH 4.440 09/02/2016   INR 1.0 03/27/2014   HGBA1C 5.9 07/19/2016    No results found.  Assessment & Plan:   Problem List Items Addressed This Visit    Headache     Appears to be due to cervical disk disease.       Relevant Medications   fentaNYL (DURAGESIC) 75 MCG/HR   Dizziness, psychogenic    chronic , without vertigo.  No progression  .  May be due to PAD.  She is not orthostatic or having vertigo.  She has PAD and DJD cervical spine . Likely medication side effect       Chronic narcotic use    Secondary to chronic low back pain and cervicalgia with documented lumbar and cervical radiculitis .Marland Kitchen ,  Continue Fentanyl and baclofen . Refill history confirmed via [place] Controlled Substance databas, accessed by me today..      Cervical spondylosis without myelopathy    Discussed referral to pain management for ESI trial. She is receptive to the idea but wants to pick the provider      Relevant Medications   fentaNYL (DURAGESIC) 75 MCG/HR   Abdominal pain    Will treat for IBS given absence of warning symptoms and refusal to see GI for EGD        A total of 40 minutes of face to face time was spent with patient more than half of which was spent in counselling and coordination of care   I have discontinued Ms. Hammack's hyoscyamine and nortriptyline. I am also having her start on dicyclomine. Additionally, I am having her maintain her aspirin, Fish Oil, hydrALAZINE, labetalol, ZETIA, clopidogrel, lisinopril, traMADol, amLODipine, NEXIUM, baclofen, chlordiazePOXIDE, and fentaNYL.  Meds ordered this encounter  Medications  . DISCONTD: fentaNYL (DURAGESIC) 75 MCG/HR    Sig: Place 1 patch (75 mcg total) onto the skin every 3 (three) days.    Dispense:  10 patch    Refill:  0    May refill on or after May 09 2017  . DISCONTD: fentaNYL (DURAGESIC) 75 MCG/HR    Sig: Place 1 patch (75 mcg total) onto the skin every 3 (three) days.    Dispense:  10 patch    Refill:  0    May refill on or after June 09 2017  . fentaNYL (DURAGESIC) 75 MCG/HR    Sig: Place 1 patch (75 mcg total) onto the skin every 3 (three) days.    Dispense:  10 patch    Refill:  0     May refill on or after July 10 2017  . dicyclomine (BENTYL) 10 MG/5ML syrup    Sig: Take 5 mLs (10 mg total) by mouth 4 (four) times daily -  before meals and at bedtime.    Dispense:  473 mL    Refill:  12    Medications Discontinued During This Encounter  Medication Reason  . labetalol (NORMODYNE) 530 MG tablet Duplicate  . hyoscyamine (LEVSIN SL) 0.125 MG SL tablet Patient Discharge  . ZETIA 10 MG tablet Duplicate  . nortriptyline (PAMELOR) 10 MG capsule Duplicate  . fentaNYL (DURAGESIC) 75 MCG/HR Reorder  . fentaNYL (DURAGESIC) 75 MCG/HR Reorder  . fentaNYL (DURAGESIC) 75 MCG/HR Reorder    Follow-up: No Follow-up on file.   Crecencio Mc, MD

## 2017-04-11 NOTE — Patient Instructions (Addendum)
Your abdominal pain may be coming from Irritable Bowel  Bowel Syndrome   I would like you to try Dicyclomine,  This is an oral  antispasmodic medication that you can take up to 4 times daily    I will refer you for the "shots" in your back once you let me know which doctor  you want to see (check with your contractor)  I have refilled your fentanyl pain patch for July August and September

## 2017-04-12 DIAGNOSIS — R109 Unspecified abdominal pain: Secondary | ICD-10-CM | POA: Insufficient documentation

## 2017-04-12 NOTE — Assessment & Plan Note (Signed)
Appears to be due to cervical disk disease.

## 2017-04-12 NOTE — Assessment & Plan Note (Signed)
Secondary to chronic low back pain and cervicalgia with documented lumbar and cervical radiculitis .. ,  Continue Fentanyl and baclofen . Refill history confirmed via [place] Controlled Substance databas, accessed by me today.Marland Kitchen

## 2017-04-12 NOTE — Assessment & Plan Note (Signed)
Discussed referral to pain management for ESI trial. She is receptive to the idea but wants to pick the provider

## 2017-04-12 NOTE — Assessment & Plan Note (Addendum)
chronic , without vertigo.  No progression  .  May be due to PAD.  She is not orthostatic or having vertigo.  She has PAD and DJD cervical spine . Likely medication side effect

## 2017-04-12 NOTE — Assessment & Plan Note (Signed)
Will treat for IBS given absence of warning symptoms and refusal to see GI for EGD

## 2017-04-25 ENCOUNTER — Other Ambulatory Visit: Payer: Self-pay | Admitting: Internal Medicine

## 2017-05-13 ENCOUNTER — Telehealth: Payer: Self-pay | Admitting: Internal Medicine

## 2017-05-13 NOTE — Telephone Encounter (Signed)
t called and stated that she received a $260.00 bill for DOS 04/11/17. Pt states that the claim went straight to [facility] and never went to Medicare. Please advise, thank you!  Call pt @ [phone]

## 2017-06-13 ENCOUNTER — Other Ambulatory Visit: Payer: Self-pay | Admitting: Internal Medicine

## 2017-06-13 DIAGNOSIS — I10 Essential (primary) hypertension: Secondary | ICD-10-CM

## 2017-06-13 NOTE — Telephone Encounter (Signed)
Emailed patient information to patient accounting for review of services.

## 2017-06-20 ENCOUNTER — Other Ambulatory Visit: Payer: Self-pay

## 2017-06-20 ENCOUNTER — Telehealth: Payer: Self-pay

## 2017-06-20 DIAGNOSIS — I10 Essential (primary) hypertension: Secondary | ICD-10-CM

## 2017-06-20 MED ORDER — LABETALOL HCL 200 MG PO TABS: 100.0000 mg | ORAL_TABLET | Freq: Three times a day (TID) | ORAL | 2 refills | Status: DC

## 2017-06-20 NOTE — Telephone Encounter (Signed)
Received a fax from pt's pharmacy stating that the rx for labetalol is written for 1/2 tablet twice a day but the pt states that it should be 1/2 tablet three times daily. Is it ok to change the rx to three times daily?

## 2017-06-20 NOTE — Telephone Encounter (Signed)
New script called into pharmacy

## 2017-06-20 NOTE — Telephone Encounter (Signed)
YES, OK TO CHANGE TO THREE TIMES DAILY

## 2017-06-28 ENCOUNTER — Telehealth: Payer: Self-pay | Admitting: Internal Medicine

## 2017-06-28 NOTE — Telephone Encounter (Signed)
Symptoms have been going on intermittently since around 06/04/2017. C/o dizziness, headaches, upset stomach, loose stools. Denies nausea or vomiting, fever, chills or any acute symptoms. Denies any symptoms today. Has not taken anything over the counter or new medications. Patient says that her plavix that she picked up from the pharmacy looks different than usual so she is going to the drug store to verify medication. Patient does not want to see anyone except for Dr. Derrel Nip. You are booked until the middle of October.   Can we schedule her for 07/08/2017 at 11:30?

## 2017-06-28 NOTE — Telephone Encounter (Signed)
Pt called back returning your call. Please advise, thank you!  Call pt @ [phone]

## 2017-06-28 NOTE — Telephone Encounter (Signed)
Pt called c/o dizziness, headaches, and upset stomach. Pt states that she has had this for about a week and a half. Pt states that this has been happening on and off and is really concerned. Please advise, thank you!  Call pt @ [phone] (may leave msg)

## 2017-06-28 NOTE — Telephone Encounter (Signed)
Left message to return call to office.

## 2017-06-29 NOTE — Telephone Encounter (Signed)
I believe that u have reserved that slot for another patient based on the 16 messages I am  Covering today.  Find out if the loose stools are occurring daily .  If so,  She should see GIi instead

## 2017-06-29 NOTE — Telephone Encounter (Signed)
Left message to return call to office.

## 2017-07-04 ENCOUNTER — Ambulatory Visit (INDEPENDENT_AMBULATORY_CARE_PROVIDER_SITE_OTHER): Payer: Medicare Other

## 2017-07-04 ENCOUNTER — Encounter: Payer: Self-pay | Admitting: Internal Medicine

## 2017-07-04 ENCOUNTER — Ambulatory Visit (INDEPENDENT_AMBULATORY_CARE_PROVIDER_SITE_OTHER): Payer: Medicare Other | Admitting: Internal Medicine

## 2017-07-04 ENCOUNTER — Telehealth: Payer: Self-pay

## 2017-07-04 VITALS — BP 160/68 | HR 62 | Temp 97.5°F | Resp 16 | Ht 61.0 in | Wt 151.4 lb

## 2017-07-04 DIAGNOSIS — R1084 Generalized abdominal pain: Secondary | ICD-10-CM

## 2017-07-04 DIAGNOSIS — I701 Atherosclerosis of renal artery: Secondary | ICD-10-CM | POA: Diagnosis not present

## 2017-07-04 DIAGNOSIS — M47812 Spondylosis without myelopathy or radiculopathy, cervical region: Secondary | ICD-10-CM | POA: Diagnosis not present

## 2017-07-04 DIAGNOSIS — I150 Renovascular hypertension: Secondary | ICD-10-CM

## 2017-07-04 DIAGNOSIS — I714 Abdominal aortic aneurysm, without rupture, unspecified: Secondary | ICD-10-CM

## 2017-07-04 DIAGNOSIS — R140 Abdominal distension (gaseous): Secondary | ICD-10-CM | POA: Diagnosis not present

## 2017-07-04 MED ORDER — LISINOPRIL 20 MG PO TABS: 20.0000 mg | ORAL_TABLET | Freq: Two times a day (BID) | ORAL | 1 refills | Status: DC

## 2017-07-04 MED ORDER — FENTANYL 75 MCG/HR TD PT72: 75.0000 ug | MEDICATED_PATCH | TRANSDERMAL | 0 refills | Status: DC

## 2017-07-04 NOTE — Patient Instructions (Signed)
   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  I will determine the best imaging study for that after I review your previous films

## 2017-07-04 NOTE — Progress Notes (Signed)
Pre-visit discussion using our clinic review tool. No additional management support is needed unless otherwise documented below in the visit note.  

## 2017-07-04 NOTE — Telephone Encounter (Signed)
Spoke with patient again today and she is stating that [facility] filled her prescription wrong. She is supposed to take 20mg  of lisinopril twice a day. She is saying that her tablets are 40 mg. Spoke with [facility] and they have a documented conversation where patient requested 40 mg tablets to take once a day. Patient has been taking a whole tab twice a day. She realized this and now the dizziness and head aches have stopped. Patient is requesting a script for lisinopril 20 mg. She has one on file. Can we go ahead and send it in ? Also patient has an appointment with you today at 5:30.

## 2017-07-04 NOTE — Telephone Encounter (Signed)
Yes, you can sedn in rx for 20 mg lisinopril bid

## 2017-07-04 NOTE — Telephone Encounter (Signed)
Rx sent to [facility]

## 2017-07-04 NOTE — Progress Notes (Signed)
Subjective:  Patient ID: Sabrina [name], female    DOB: 01-04-1941  Age: 76 y.o. MRN: [id number]  CC: The primary encounter diagnosis was Generalized abdominal pain. Diagnoses of Atherosclerosis of renal artery (HCC), Cervical spondylosis without myelopathy, Renovascular hypertension, and Abdominal aortic aneurysm (AAA) without rupture ([place]) were also pertinent to this visit.  HPI [name] presents for evaluation of abdominal pain that has persistened since August 17th, when it was initially accompanied by diarrhea that started around august 17th  After returning home from [place] to attend her sister's funeral  No sick contacts,  Other family members not affected.    Travelled to [place] August 10th was feeling fine. .  Didn't start feeling bad until she returned  Home  . Describes max of 3 loose stools daily,  No vomiting ,  Some sweats,  Described abd pain ascramping  and relieved by stooling .  Currently her pain is not cramping, and described as "griping " stools pattern has resume her chronic constipation pattern,  Moving  bowels 3 times weekly  .  Some straining,  No blood  .  Appetite somewhat poor  But still eating the same diet she has eaten daily for the last year   Still has daily dizziness  Described as mild, not vertiginous.  Has PAD with carotid artery stenosis stable by annual vascular eval Nov 2017, ischemci changes on last MRI 2016 Manuella Ghazi)    Has an unintentional 6 lb  Weight loss. Since last visit . Eats THE SAME MEAL EVERY DAY    Lab Results  Component Value Date   CREATININE 1.18 (H) 02/07/2017     Outpatient Medications Prior to Visit  Medication Sig Dispense Refill  . amLODipine (NORVASC) 10 MG tablet TAKE 1 TABLET BY MOUTH ONCE DAILY 90 tablet 0  . aspirin 81 MG tablet Take 81 mg by mouth daily.    . baclofen (LIORESAL) 10 MG tablet Take 1 tablet (10 mg total) by mouth 3 (three) times daily as needed. for muscle spams 90 tablet 5  . chlordiazePOXIDE  (LIBRIUM) 25 MG capsule TAKE ONE CAPSULE BY MOUTH ONCE DAILY AS NEEDED FOR ANXIETY 30 capsule 5  . clopidogrel (PLAVIX) 75 MG tablet TAKE ONE TABLET BY MOUTH ONCE DAILY 90 tablet 2  . hydrALAZINE (APRESOLINE) 50 MG tablet TAKE ONE TABLET BY MOUTH THREE TIMES DAILY AS NEEDED 270 tablet 1  . labetalol (NORMODYNE) 200 MG tablet Take 0.5 tablets (100 mg total) by mouth 3 (three) times daily. 90 tablet 2  . lisinopril (PRINIVIL,ZESTRIL) 20 MG tablet Take 1 tablet (20 mg total) by mouth 2 (two) times daily. 90 tablet 1  . NEXIUM 40 MG capsule Take 1 capsule (40 mg total) by mouth daily. 90 capsule 3  . NEXIUM 40 MG capsule TAKE ONE CAPSULE BY MOUTH ONCE DAILY 30 capsule 11  . Omega-3 Fatty Acids (FISH OIL) 1000 MG CAPS Take 1 capsule by mouth daily.    . traMADol (ULTRAM) 50 MG tablet TAKE TWO TABLETS BY MOUTH EVERY 8 HOURS AS NEEDED 180 tablet 3  . ZETIA 10 MG tablet Take 1 tablet (10 mg total) by mouth daily. 30 tablet 5  . fentaNYL (DURAGESIC) 75 MCG/HR Place 1 patch (75 mcg total) onto the skin every 3 (three) days. 10 patch 0  . dicyclomine (BENTYL) 10 MG/5ML syrup Take 5 mLs (10 mg total) by mouth 4 (four) times daily -  before meals and at bedtime. (Patient not taking: Reported on 07/04/2017)  473 mL 12   No facility-administered medications prior to visit.     Review of Systems;  Patient denies headache, fevers, malaise,  skin rash, eye pain, sinus congestion and sinus pain, sore throat, dysphagia,  hemoptysis , cough, dyspnea, wheezing, chest pain, palpitations, orthopnea, edema,  nausea, melena, diarrhea,, flank pain, dysuria, hematuria, urinary  Frequency, nocturia, numbness, tingling, seizures,  Focal weakness, Loss of consciousness,  Tremor, insomnia, depression, anxiety, and suicidal ideation.      Objective:  BP (!) 160/68 (BP Location: Left Arm, Patient Position: Sitting, Cuff Size: Normal)   Pulse 62   Temp (!) 97.5 F (36.4 C) (Oral)   Resp 16   Ht '5\' 1"'$  (1.549 m)   Wt 151 lb 6  oz (68.7 kg)   SpO2 98%   BMI 28.60 kg/m   BP Readings from Last 3 Encounters:  07/04/17 (!) 160/68  04/11/17 (!) 158/76  02/11/17 (!) 166/88    Wt Readings from Last 3 Encounters:  07/04/17 151 lb 6 oz (68.7 kg)  04/11/17 157 lb 6 oz (71.4 kg)  02/11/17 162 lb 8 oz (73.7 kg)    General appearance: alert, anxious, cooperative and appears stated age Ears: normal TM's and external ear canals both ears Throat: lips, mucosa, and tongue normal; teeth and gums normal Neck: no adenopathy, no carotid bruit, supple, symmetrical, trachea midline and thyroid not enlarged, symmetric, no tenderness/mass/nodules Back: symmetric, no curvature. ROM normal. No CVA tenderness. Lungs: clear to auscultation bilaterally Heart: regular rate and rhythm, S1, S2 normal, no murmur, click, rub or gallop Abdomen: soft, diffusely tender on left side without guarding or masses,  bowel sounds normal;   no organomegaly.  abd bruit noted Pulses: 2+ and symmetric Skin: Skin color, texture, turgor normal. No rashes or lesions Lymph nodes: Cervical, supraclavicular, and axillary nodes normal.  Lab Results  Component Value Date   HGBA1C 5.9 07/19/2016   HGBA1C 5.8 06/02/2015    Lab Results  Component Value Date   CREATININE 1.18 (H) 02/07/2017   CREATININE 1.15 07/19/2016   CREATININE 1.09 06/01/2016    Lab Results  Component Value Date   WBC 6.2 02/07/2017   HGB 11.9 (L) 02/07/2017   HCT 35.1 02/07/2017   PLT 259 02/07/2017   GLUCOSE 89 02/07/2017   CHOL 167 07/19/2016   TRIG 55.0 07/19/2016   HDL 57.90 07/19/2016   LDLDIRECT 109.0 03/05/2016   LDLCALC 98 07/19/2016   ALT 14 02/07/2017   AST 20 02/07/2017   NA 135 02/07/2017   K 4.1 02/07/2017   CL 100 (L) 02/07/2017   CREATININE 1.18 (H) 02/07/2017   BUN 16 02/07/2017   CO2 29 02/07/2017   TSH 4.440 09/02/2016   INR 1.0 03/27/2014   HGBA1C 5.9 07/19/2016    No results found.  Assessment & Plan:   Problem List Items Addressed This  Visit    Abdominal pain - Primary    She continues to report diffuse abdominal pain of varying description for the past several months and has lost weight involuntarily. She is taking a PPI  Daily has deferred GI consult.  Except for a few days of loose stool her bowel habits are notable for constipation. She has abdominal bruit on exam and review of records notes a < 3 cm infrarenal AAA on ct done in 2011 , with no recent vascular follow up for same   Contrasted CT is C/I due to CKD stage III.  Plain films of abd today to eval for  constipation burden,,  Labs pending. Will consider oral contrast CT to evaluate bowels and aortic ultrasound to evaluate AAA  Lab Results  Component Value Date   CREATININE 1.18 (H) 02/07/2017   Lab Results  Component Value Date   LIPASE 14.0 03/05/2016         Relevant Orders   DG Abd 1 View   CBC w/Diff   Comp Met (CMET)   Lipase   Aortic aneurysm, abdominal ([place])    Noted on 2011 CT (upon comprehensive review of chart following detection nof bruit on exam today) ,  Refer to vascular surgery for aortic ultrasound       Relevant Orders   US Aorta   Atherosclerosis of renal artery ([place])    S/p bilateral renal artery stents, patent by Nov 2017 dopplers      Cervical spondylosis without myelopathy   Relevant Medications   fentaNYL (DURAGESIC) 75 MCG/HR   HTN (hypertension)    Difficult to control with history of RAS and history hyperdynamic EF of 98% by nuc med study in 2011.  Home readings are consistently better than readings done here.          I am having Ms. Maravilla maintain her aspirin, Fish Oil, hydrALAZINE, ZETIA, clopidogrel, traMADol, NEXIUM, baclofen, chlordiazePOXIDE, dicyclomine, amLODipine, NEXIUM, labetalol, lisinopril, and fentaNYL.  Meds ordered this encounter  Medications  . fentaNYL (DURAGESIC) 75 MCG/HR    Sig: Place 1 patch (75 mcg total) onto the skin every 3 (three) days.    Dispense:  10 patch    Refill:  0    May  refill on or after August 09 2017    Medications Discontinued During This Encounter  Medication Reason  . fentaNYL (DURAGESIC) 75 MCG/HR Reorder    Follow-up: No Follow-up on file.   Crecencio Mc, MD

## 2017-07-05 DIAGNOSIS — I714 Abdominal aortic aneurysm, without rupture, unspecified: Secondary | ICD-10-CM | POA: Insufficient documentation

## 2017-07-05 NOTE — Assessment & Plan Note (Addendum)
She continues to report diffuse abdominal pain of varying description for the past several months and has lost weight involuntarily. She is taking a PPI  Daily has deferred GI consult.  Except for a few days of loose stool her bowel habits are notable for constipation. She has abdominal bruit on exam and review of records notes a < 3 cm infrarenal AAA on ct done in 2011 , with no recent vascular follow up for same   Contrasted CT is C/I due to CKD stage III.  Plain films of abd today to eval for constipation burden,,  Labs pending. Will consider oral contrast CT to evaluate bowels and aortic ultrasound to evaluate AAA  Lab Results  Component Value Date   CREATININE 1.18 (H) 02/07/2017   Lab Results  Component Value Date   LIPASE 14.0 03/05/2016

## 2017-07-05 NOTE — Assessment & Plan Note (Signed)
Noted on 2011 CT (upon comprehensive review of chart following detection nof bruit on exam today) ,  Refer to vascular surgery for aortic ultrasound

## 2017-07-05 NOTE — Assessment & Plan Note (Signed)
Difficult to control with history of RAS and history hyperdynamic EF of 98% by nuc med study in 2011.  Home readings are consistently better than readings done here.

## 2017-07-05 NOTE — Assessment & Plan Note (Signed)
S/p bilateral renal artery stents, patent by Nov 2017 dopplers

## 2017-07-06 ENCOUNTER — Other Ambulatory Visit (INDEPENDENT_AMBULATORY_CARE_PROVIDER_SITE_OTHER): Payer: Medicare Other

## 2017-07-06 DIAGNOSIS — R1084 Generalized abdominal pain: Secondary | ICD-10-CM | POA: Diagnosis not present

## 2017-07-06 LAB — COMPREHENSIVE METABOLIC PANEL
ALT: 9 U/L (ref 0–35)
AST: 16 U/L (ref 0–37)
Albumin: 4 g/dL (ref 3.5–5.2)
Alkaline Phosphatase: 76 U/L (ref 39–117)
BUN: 18 mg/dL (ref 6–23)
CO2: 27 mEq/L (ref 19–32)
Calcium: 9.6 mg/dL (ref 8.4–10.5)
Chloride: 101 mEq/L (ref 96–112)
Creatinine, Ser: 1.2 mg/dL (ref 0.40–1.20)
GFR: 56.27 mL/min — ABNORMAL LOW (ref >60.00)
Glucose, Bld: 91 mg/dL (ref 70–99)
Potassium: 4.1 mEq/L (ref 3.5–5.1)
Sodium: 137 mEq/L (ref 135–145)
Total Bilirubin: 0.4 mg/dL (ref 0.2–1.2)
Total Protein: 7.1 g/dL (ref 6.0–8.3)

## 2017-07-06 LAB — CBC WITH DIFFERENTIAL/PLATELET
Basophils Absolute: 0 10*3/uL (ref 0.0–0.1)
Basophils Relative: 0.6 % (ref 0.0–3.0)
Eosinophils Absolute: 0.4 10*3/uL (ref 0.0–0.7)
Eosinophils Relative: 8.1 % — ABNORMAL HIGH (ref 0.0–5.0)
HCT: 33.6 % — ABNORMAL LOW (ref 36.0–46.0)
Hemoglobin: 11.2 g/dL — ABNORMAL LOW (ref 12.0–15.0)
Lymphocytes Relative: 46.1 % — ABNORMAL HIGH (ref 12.0–46.0)
Lymphs Abs: 2.5 10*3/uL (ref 0.7–4.0)
MCHC: 33.4 g/dL (ref 30.0–36.0)
MCV: 85.5 fl (ref 78.0–100.0)
Monocytes Absolute: 0.6 10*3/uL (ref 0.1–1.0)
Monocytes Relative: 10.4 % (ref 3.0–12.0)
Neutro Abs: 1.9 10*3/uL (ref 1.4–7.7)
Neutrophils Relative %: 34.8 % — ABNORMAL LOW (ref 43.0–77.0)
Platelets: 264 10*3/uL (ref 150.0–400.0)
RBC: 3.93 Mil/uL (ref 3.87–5.11)
RDW: 13.5 % (ref 11.5–15.5)
WBC: 5.4 10*3/uL (ref 4.0–10.5)

## 2017-07-06 LAB — LIPASE: Lipase: 16 U/L (ref 11.0–59.0)

## 2017-07-07 ENCOUNTER — Other Ambulatory Visit: Payer: Self-pay | Admitting: Internal Medicine

## 2017-07-07 DIAGNOSIS — R1013 Epigastric pain: Secondary | ICD-10-CM

## 2017-07-07 DIAGNOSIS — Z1211 Encounter for screening for malignant neoplasm of colon: Secondary | ICD-10-CM

## 2017-07-07 DIAGNOSIS — K2970 Gastritis, unspecified, without bleeding: Secondary | ICD-10-CM

## 2017-07-07 DIAGNOSIS — K2100 Gastro-esophageal reflux disease with esophagitis, without bleeding: Secondary | ICD-10-CM

## 2017-07-13 ENCOUNTER — Ambulatory Visit (INDEPENDENT_AMBULATORY_CARE_PROVIDER_SITE_OTHER): Payer: Medicare Other

## 2017-07-13 DIAGNOSIS — I714 Abdominal aortic aneurysm, without rupture, unspecified: Secondary | ICD-10-CM

## 2017-07-13 DIAGNOSIS — I77811 Abdominal aortic ectasia: Secondary | ICD-10-CM

## 2017-07-13 HISTORY — DX: Abdominal aortic ectasia: I77.811

## 2017-07-15 ENCOUNTER — Other Ambulatory Visit: Payer: Self-pay | Admitting: Internal Medicine

## 2017-07-15 DIAGNOSIS — I714 Abdominal aortic aneurysm, without rupture, unspecified: Secondary | ICD-10-CM

## 2017-07-20 ENCOUNTER — Telehealth: Payer: Self-pay | Admitting: Internal Medicine

## 2017-07-20 DIAGNOSIS — K551 Chronic vascular disorders of intestine: Secondary | ICD-10-CM

## 2017-07-20 NOTE — Telephone Encounter (Signed)
Spoke with Dr Delana Meyer and he agrees that a CT angiogram should be done for further evaluation  Of the blood vessels that supply blood flow to her intestines to make sure there is not a blockage.  I am ordering it now   Lab Results  Component Value Date   CREATININE 1.20 07/06/2017

## 2017-07-21 NOTE — Telephone Encounter (Signed)
Spoke with pt and informed her of this message. Pt gave a verbal understanding and is okay with doing the CT. Explained to pt that Melissa from our office will be giving her a call to schedule that once it gets approved through the insurance.

## 2017-08-02 ENCOUNTER — Other Ambulatory Visit: Payer: Self-pay

## 2017-08-02 ENCOUNTER — Ambulatory Visit (INDEPENDENT_AMBULATORY_CARE_PROVIDER_SITE_OTHER): Payer: Medicare Other | Admitting: Gastroenterology

## 2017-08-02 ENCOUNTER — Ambulatory Visit: Payer: Medicare Other | Admitting: Gastroenterology

## 2017-08-02 ENCOUNTER — Encounter: Payer: Self-pay | Admitting: Gastroenterology

## 2017-08-02 VITALS — BP 182/71 | HR 71 | Temp 98.1°F | Ht 61.0 in | Wt 155.6 lb

## 2017-08-02 DIAGNOSIS — R1013 Epigastric pain: Secondary | ICD-10-CM | POA: Diagnosis not present

## 2017-08-02 DIAGNOSIS — R1032 Left lower quadrant pain: Secondary | ICD-10-CM

## 2017-08-02 DIAGNOSIS — I701 Atherosclerosis of renal artery: Secondary | ICD-10-CM

## 2017-08-02 DIAGNOSIS — R197 Diarrhea, unspecified: Secondary | ICD-10-CM

## 2017-08-02 NOTE — Progress Notes (Signed)
Gastroenterology Consultation  Referring Provider:     Crecencio Mc, MD Primary Care Physician:  Crecencio Mc, MD Primary Gastroenterologist:  Dr. Allen Norris     Reason for Consultation:     Abdominal pain        HPI:   Sabrina [name] is a 76 y.o. y/o female referred for consultation & management of Abdominal pain by Dr. Derrel Nip, Aris Everts, MD.  This patient comes in today with a history of abdominal pain and started in mid August.  The patient reports that she had the abdominal pain that commenced just after attending her sister's funeral.  The patient reports that her sister had been sick for some time and she went to [place] to attend the funeral of the sister.  She denies any sick contacts.  She was reporting that she had been usually constipated but started to have significant diarrhea.  She now reports that her diarrhea has resolved and she is having normal bowel movements without much constipation.  She has a history of an abdominal aortic aneurysm.  She now reports that the diarrhea and abdominal pain has subsided.  Some of her abdominal pain was on the left lower abdomen and some in the epigastric area.  The patient has not had a colonoscopy since 2008 which was done by me.  Past Medical History:  Diagnosis Date  . Anemia of chronic kidney failure   . Anxiety   . CAD (coronary artery disease)   . Carotid artery stenosis   . Chronic kidney disease, stage III (moderate) (HCC)    Followed by Dr. Juleen China  . Hyperlipidemia   . Hypertension   . Renal artery stenosis ([place])   . Secondary hyperparathyroidism ([place])   . 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 Subclavian arterial stenosis [facility])     Past Surgical History:  Procedure Laterality Date  . ABDOMINAL HYSTERECTOMY  1976  . CAROTID ARTERY ANGIOPLASTY Left   . CAROTID ENDARTERECTOMY Left   . CHOLECYSTECTOMY    . CORONARY ANGIOPLASTY WITH STENT PLACEMENT  2000  . CORONARY ARTERY BYPASS GRAFT  2000  .  CYSTOSCOPY WITH STENT PLACEMENT Bilateral   . RENAL ARTERY ANGIOPLASTY Bilateral mARCH 2015  . TOE AMPUTATION Right    small toe  . TONSILLECTOMY AND ADENOIDECTOMY    . TOTAL HIP ARTHROPLASTY Left   . TOTAL HIP ARTHROPLASTY Right 2015    Prior to Admission medications   Medication Sig Start Date End Date Taking? Authorizing Provider  amLODipine (NORVASC) 10 MG tablet TAKE 1 TABLET BY MOUTH ONCE DAILY 04/26/17  Yes Crecencio Mc, MD  aspirin 81 MG tablet Take 81 mg by mouth daily.   Yes [provider]  baclofen (LIORESAL) 10 MG tablet Take 1 tablet (10 mg total) by mouth 3 (three) times daily as needed. for muscle spams 02/11/17  Yes Crecencio Mc, MD  chlordiazePOXIDE (LIBRIUM) 25 MG capsule TAKE ONE CAPSULE BY MOUTH ONCE DAILY AS NEEDED FOR ANXIETY 03/28/17  Yes Crecencio Mc, MD  clopidogrel (PLAVIX) 75 MG tablet TAKE ONE TABLET BY MOUTH ONCE DAILY 11/12/16  Yes Crecencio Mc, MD  fentaNYL (DURAGESIC) 75 MCG/HR Place 1 patch (75 mcg total) onto the skin every 3 (three) days. 07/04/17  Yes Crecencio Mc, MD  labetalol (NORMODYNE) 200 MG tablet Take 0.5 tablets (100 mg total) by mouth 3 (three) times daily. 06/20/17  Yes Crecencio Mc, MD  lisinopril (PRINIVIL,ZESTRIL) 20 MG  tablet Take 1 tablet (20 mg total) by mouth 2 (two) times daily. 07/04/17  Yes Crecencio Mc, MD  lisinopril (PRINIVIL,ZESTRIL) 40 MG tablet  06/19/17  Yes [provider]  NEXIUM 40 MG capsule Take 1 capsule (40 mg total) by mouth daily. 02/11/17  Yes Crecencio Mc, MD  NEXIUM 40 MG capsule TAKE ONE CAPSULE BY MOUTH ONCE DAILY 04/26/17  Yes Crecencio Mc, MD  Omega-3 Fatty Acids (FISH OIL) 1000 MG CAPS Take 1 capsule by mouth daily.   Yes [provider]  traMADol (ULTRAM) 50 MG tablet TAKE TWO TABLETS BY MOUTH EVERY 8 HOURS AS NEEDED 01/07/17  Yes Crecencio Mc, MD  ZETIA 10 MG tablet Take 1 tablet (10 mg total) by mouth daily. 10/19/16  Yes Crecencio Mc, MD  dicyclomine (BENTYL) 10  MG/5ML syrup Take 5 mLs (10 mg total) by mouth 4 (four) times daily -  before meals and at bedtime. Patient not taking: Reported on 07/04/2017 04/11/17   Crecencio Mc, MD  hydrALAZINE (APRESOLINE) 50 MG tablet TAKE ONE TABLET BY MOUTH THREE TIMES DAILY AS NEEDED Patient not taking: Reported on 08/02/2017 03/31/16   Crecencio Mc, MD    Family History  Problem Relation Age of Onset  . Stroke Mother   . Hypertension Mother   . Diabetes Mother   . Hypertension Father   . Heart disease Sister        MI  . Multiple sclerosis Daughter   . Multiple sclerosis Son   . Cerebral aneurysm Son   . Seizures Son   . Cerebral aneurysm Son   . Breast cancer Paternal Aunt 61     Social History  Substance Use Topics  . Smoking status: Former Smoker    Quit date: 10/25/1998  . Smokeless tobacco: Never Used  . Alcohol use No    Allergies as of 08/02/2017 - Review Complete 08/02/2017  Allergen Reaction Noted  . Citalopram  05/22/2015  . Dilaudid [hydromorphone hcl] Nausea And Vomiting 12/27/2013  . Hydrochlorothiazide Other (See Comments) 09/08/2014  . Nsaids  07/04/2014  . Nubain [nalbuphine hcl]  12/27/2013  . Penicillins Itching 12/27/2013  . Statins Itching 12/27/2013    Review of Systems:    All systems reviewed and negative except where noted in HPI.   Physical Exam:  BP (!) 182/71   Pulse 71   Temp 98.1 F (36.7 C) (Oral)   Ht 5\' 1"  (1.549 m)   Wt 155 lb 9.6 oz (70.6 kg)   BMI 29.40 kg/m  No LMP recorded. Patient has had a hysterectomy. Psych:  Alert and cooperative. Normal mood and affect. General:   Alert,  Well-developed, well-nourished, pleasant and cooperative in NAD Head:  Normocephalic and atraumatic. Eyes:  Sclera clear, no icterus.   Conjunctiva pink. Ears:  Normal auditory acuity. Nose:  No deformity, discharge, or lesions. Mouth:  No deformity or lesions,oropharynx pink & moist. Neck:  Supple; no masses or thyromegaly. Lungs:  Respirations even and unlabored.   Clear throughout to auscultation.   No wheezes, crackles, or rhonchi. No acute distress. Heart:  Regular rate and rhythm; no murmurs, clicks, rubs, or gallops. Abdomen:  Normal bowel sounds.  No bruits.  Soft, non-tender and non-distended without masses, hepatosplenomegaly or hernias noted.  No guarding or rebound tenderness.  Negative Carnett sign.   Rectal:  Deferred.  Msk:  Symmetrical without gross deformities.  Good, equal movement & strength bilaterally. Pulses:  Normal pulses noted. Extremities:  No  clubbing or edema.  No cyanosis. Neurologic:  Alert and oriented x3;  grossly normal neurologically. Skin:  Intact without significant lesions or rashes.  No jaundice. Lymph Nodes:  No significant cervical adenopathy. Psych:  Alert and cooperative. Normal mood and affect.  Imaging Studies: Dg Abd 1 View  Result Date: 07/05/2017 CLINICAL DATA:  Abdominal distention and pain, chronic constipation, recent diarrheal illness, history hypertension, coronary artery disease, stage III chronic kidney disease EXAM: ABDOMEN - 1 VIEW COMPARISON:  None FINDINGS: Minimally prominent stool in the distal half of the colon. Bowel gas pattern otherwise normal. No bowel dilatation or bowel wall thickening. Bones diffusely demineralized with note of BILATERAL hip prostheses. Degenerative disc and facet disease changes of the lumbar spine. Vascular stents identified at the renal and iliac arteries bilaterally. Surgical clips RIGHT upper quadrant and perigastric. IMPRESSION: Minimally prominent stool in distal colon. No evidence of bowel obstruction. Electronically Signed   By: Lavonia Dana M.D.   On: 07/05/2017 09:22    Assessment and Plan:   Sabrina [name] is a 76 y.o. y/o female Who comes in today with a history of diarrhea which has resolved.  The patient also had epigastric and left lower sided pain.  The patient had no sick contacts and although her symptoms started around the time of her sister's funeral  they have since resolved.  The patient is due for a colonoscopy since she has not had a colonoscopy in 10 years.  The patient will also be set up for an EGD because of her report of epigastric pain. I have discussed risks & benefits which include, but are not limited to, bleeding, infection, perforation & drug reaction.  The patient agrees with this plan & written consent will be obtained.     [name], MD. Marval Regal   Note: This dictation was prepared with Dragon dictation along with smaller phrase technology. Any transcriptional errors that result from this process are unintentional.

## 2017-08-03 ENCOUNTER — Encounter (INDEPENDENT_AMBULATORY_CARE_PROVIDER_SITE_OTHER): Payer: Self-pay

## 2017-08-03 ENCOUNTER — Ambulatory Visit: Payer: Medicare Other

## 2017-08-04 ENCOUNTER — Encounter: Payer: Self-pay | Admitting: Internal Medicine

## 2017-08-04 ENCOUNTER — Other Ambulatory Visit: Payer: Self-pay

## 2017-08-04 ENCOUNTER — Ambulatory Visit
Admission: RE | Admit: 2017-08-04 | Discharge: 2017-08-04 | Disposition: A | Discharge status: Home / Self Care | Payer: Medicare Other | Source: Ambulatory Visit | Attending: Internal Medicine | Admitting: Internal Medicine

## 2017-08-04 DIAGNOSIS — I77811 Abdominal aortic ectasia: Secondary | ICD-10-CM | POA: Diagnosis not present

## 2017-08-04 DIAGNOSIS — I774 Celiac artery compression syndrome: Secondary | ICD-10-CM | POA: Diagnosis not present

## 2017-08-04 DIAGNOSIS — K551 Chronic vascular disorders of intestine: Secondary | ICD-10-CM | POA: Insufficient documentation

## 2017-08-04 DIAGNOSIS — Z90710 Acquired absence of both cervix and uterus: Secondary | ICD-10-CM | POA: Diagnosis not present

## 2017-08-04 DIAGNOSIS — I70201 Unspecified atherosclerosis of native arteries of extremities, right leg: Secondary | ICD-10-CM | POA: Insufficient documentation

## 2017-08-04 DIAGNOSIS — Z96643 Presence of artificial hip joint, bilateral: Secondary | ICD-10-CM | POA: Diagnosis not present

## 2017-08-04 DIAGNOSIS — Z1211 Encounter for screening for malignant neoplasm of colon: Secondary | ICD-10-CM

## 2017-08-04 DIAGNOSIS — I700 Atherosclerosis of aorta: Secondary | ICD-10-CM | POA: Diagnosis not present

## 2017-08-04 MED ORDER — IOPAMIDOL (ISOVUE-370) INJECTION 76%
100.0000 mL | Freq: Once | INTRAVENOUS | Status: AC | PRN
  Administered 2017-08-04: 100 mL via INTRAVENOUS

## 2017-08-10 ENCOUNTER — Other Ambulatory Visit: Payer: Self-pay | Admitting: Internal Medicine

## 2017-08-12 ENCOUNTER — Encounter: Payer: Self-pay | Admitting: Gastroenterology

## 2017-08-12 NOTE — Telephone Encounter (Signed)
error 

## 2017-08-15 ENCOUNTER — Encounter: Payer: Self-pay | Admitting: *Deleted

## 2017-08-16 ENCOUNTER — Ambulatory Visit: Payer: Medicare Other | Admitting: Anesthesiology

## 2017-08-16 ENCOUNTER — Encounter: Payer: Self-pay | Admitting: *Deleted

## 2017-08-16 ENCOUNTER — Encounter
Admission: RE | Disposition: A | Discharge status: Home / Self Care | Payer: Self-pay | Source: Ambulatory Visit | Attending: Gastroenterology

## 2017-08-16 ENCOUNTER — Ambulatory Visit
Admission: RE | Admit: 2017-08-16 | Discharge: 2017-08-16 | Disposition: A | Discharge status: Home / Self Care | Payer: Medicare Other | Source: Ambulatory Visit | Attending: Gastroenterology | Admitting: Gastroenterology

## 2017-08-16 DIAGNOSIS — Z7902 Long term (current) use of antithrombotics/antiplatelets: Secondary | ICD-10-CM | POA: Insufficient documentation

## 2017-08-16 DIAGNOSIS — Z1211 Encounter for screening for malignant neoplasm of colon: Secondary | ICD-10-CM | POA: Insufficient documentation

## 2017-08-16 DIAGNOSIS — Z886 Allergy status to analgesic agent status: Secondary | ICD-10-CM | POA: Diagnosis not present

## 2017-08-16 DIAGNOSIS — Z7982 Long term (current) use of aspirin: Secondary | ICD-10-CM | POA: Diagnosis not present

## 2017-08-16 DIAGNOSIS — Z79899 Other long term (current) drug therapy: Secondary | ICD-10-CM | POA: Insufficient documentation

## 2017-08-16 DIAGNOSIS — I739 Peripheral vascular disease, unspecified: Secondary | ICD-10-CM | POA: Insufficient documentation

## 2017-08-16 DIAGNOSIS — I129 Hypertensive chronic kidney disease with stage 1 through stage 4 chronic kidney disease, or unspecified chronic kidney disease: Secondary | ICD-10-CM | POA: Insufficient documentation

## 2017-08-16 DIAGNOSIS — Z885 Allergy status to narcotic agent status: Secondary | ICD-10-CM | POA: Diagnosis not present

## 2017-08-16 DIAGNOSIS — M1990 Unspecified osteoarthritis, unspecified site: Secondary | ICD-10-CM | POA: Diagnosis not present

## 2017-08-16 DIAGNOSIS — D631 Anemia in chronic kidney disease: Secondary | ICD-10-CM | POA: Insufficient documentation

## 2017-08-16 DIAGNOSIS — E785 Hyperlipidemia, unspecified: Secondary | ICD-10-CM | POA: Insufficient documentation

## 2017-08-16 DIAGNOSIS — K640 First degree hemorrhoids: Secondary | ICD-10-CM | POA: Diagnosis not present

## 2017-08-16 DIAGNOSIS — M48061 Spinal stenosis, lumbar region without neurogenic claudication: Secondary | ICD-10-CM | POA: Diagnosis not present

## 2017-08-16 DIAGNOSIS — Z96643 Presence of artificial hip joint, bilateral: Secondary | ICD-10-CM | POA: Insufficient documentation

## 2017-08-16 DIAGNOSIS — Z90710 Acquired absence of both cervix and uterus: Secondary | ICD-10-CM | POA: Diagnosis not present

## 2017-08-16 DIAGNOSIS — K219 Gastro-esophageal reflux disease without esophagitis: Secondary | ICD-10-CM | POA: Insufficient documentation

## 2017-08-16 DIAGNOSIS — N2581 Secondary hyperparathyroidism of renal origin: Secondary | ICD-10-CM | POA: Diagnosis not present

## 2017-08-16 DIAGNOSIS — Z8249 Family history of ischemic heart disease and other diseases of the circulatory system: Secondary | ICD-10-CM | POA: Diagnosis not present

## 2017-08-16 DIAGNOSIS — Z880 Allergy status to penicillin: Secondary | ICD-10-CM | POA: Diagnosis not present

## 2017-08-16 DIAGNOSIS — F419 Anxiety disorder, unspecified: Secondary | ICD-10-CM | POA: Insufficient documentation

## 2017-08-16 DIAGNOSIS — Z89421 Acquired absence of other right toe(s): Secondary | ICD-10-CM | POA: Insufficient documentation

## 2017-08-16 DIAGNOSIS — Z888 Allergy status to other drugs, medicaments and biological substances status: Secondary | ICD-10-CM | POA: Diagnosis not present

## 2017-08-16 DIAGNOSIS — N183 Chronic kidney disease, stage 3 (moderate): Secondary | ICD-10-CM | POA: Diagnosis not present

## 2017-08-16 DIAGNOSIS — I10 Essential (primary) hypertension: Secondary | ICD-10-CM | POA: Diagnosis not present

## 2017-08-16 DIAGNOSIS — Z87891 Personal history of nicotine dependence: Secondary | ICD-10-CM | POA: Insufficient documentation

## 2017-08-16 DIAGNOSIS — Z951 Presence of aortocoronary bypass graft: Secondary | ICD-10-CM | POA: Diagnosis not present

## 2017-08-16 DIAGNOSIS — R1013 Epigastric pain: Secondary | ICD-10-CM | POA: Diagnosis not present

## 2017-08-16 DIAGNOSIS — I2510 Atherosclerotic heart disease of native coronary artery without angina pectoris: Secondary | ICD-10-CM | POA: Insufficient documentation

## 2017-08-16 HISTORY — PX: COLONOSCOPY WITH PROPOFOL: SHX5780

## 2017-08-16 HISTORY — PX: ESOPHAGOGASTRODUODENOSCOPY (EGD) WITH PROPOFOL: SHX5813

## 2017-08-16 SURGERY — COLONOSCOPY WITH PROPOFOL
Anesthesia: General

## 2017-08-16 MED ORDER — PROPOFOL 10 MG/ML IV BOLUS
INTRAVENOUS | Status: DC | PRN
  Administered 2017-08-16: 20 mg via INTRAVENOUS
  Administered 2017-08-16 (×2): 30 mg via INTRAVENOUS

## 2017-08-16 MED ORDER — LIDOCAINE HCL (PF) 1 % IJ SOLN
INTRAMUSCULAR | Status: AC
  Administered 2017-08-16: 0.1 mL via SUBCUTANEOUS
  Filled 2017-08-16: qty 2

## 2017-08-16 MED ORDER — SODIUM CHLORIDE 0.9 % IV SOLN
INTRAVENOUS | Status: DC
  Administered 2017-08-16: 1000 mL via INTRAVENOUS

## 2017-08-16 MED ORDER — LIDOCAINE HCL (CARDIAC) 20 MG/ML IV SOLN
INTRAVENOUS | Status: DC | PRN
  Administered 2017-08-16: 30 mg via INTRAVENOUS

## 2017-08-16 MED ORDER — PROPOFOL 500 MG/50ML IV EMUL
INTRAVENOUS | Status: DC | PRN
  Administered 2017-08-16: 150 ug/kg/min via INTRAVENOUS

## 2017-08-16 MED ORDER — PROPOFOL 500 MG/50ML IV EMUL
INTRAVENOUS | Status: AC
  Filled 2017-08-16: qty 50

## 2017-08-16 MED ORDER — PHENYLEPHRINE HCL 10 MG/ML IJ SOLN
INTRAMUSCULAR | Status: DC | PRN
  Administered 2017-08-16 (×2): 100 ug via INTRAVENOUS

## 2017-08-16 NOTE — Transfer of Care (Signed)
Immediate Anesthesia Transfer of Care Note  Patient: Sabrina [name]  Procedure(s) Performed: COLONOSCOPY WITH PROPOFOL (N/A ) ESOPHAGOGASTRODUODENOSCOPY (EGD) WITH PROPOFOL (N/A )  Patient Location: PACU  Anesthesia Type:General  Level of Consciousness: awake  Airway & Oxygen Therapy: Patient Spontanous Breathing and Patient connected to nasal cannula oxygen  Post-op Assessment: Report given to RN and Post -op Vital signs reviewed and stable  Post vital signs: Reviewed and stable  Last Vitals:  Vitals:   08/16/17 0752 08/16/17 0942  BP: (!) 165/72   Pulse: 85 72  Resp: 20 20  Temp: (!) 35.9 C 36.5 C  SpO2: 99%     Last Pain:  Vitals:   08/16/17 0942  TempSrc: Tympanic  PainSc: 0-No pain         Complications: No apparent anesthesia complications

## 2017-08-16 NOTE — Anesthesia Procedure Notes (Signed)
Date/Time: 08/16/2017 9:19 AM Performed by: Johnna Acosta Pre-anesthesia Checklist: Patient identified, Emergency Drugs available, Suction available, Patient being monitored and Timeout performed Patient Re-evaluated:Patient Re-evaluated prior to induction Oxygen Delivery Method: Nasal cannula

## 2017-08-16 NOTE — Anesthesia Postprocedure Evaluation (Signed)
Anesthesia Post Note  Patient: Sabrina [name]  Procedure(s) Performed: COLONOSCOPY WITH PROPOFOL (N/A ) ESOPHAGOGASTRODUODENOSCOPY (EGD) WITH PROPOFOL (N/A )  Patient location during evaluation: Endoscopy Anesthesia Type: General Level of consciousness: awake and alert Pain management: pain level controlled Vital Signs Assessment: post-procedure vital signs reviewed and stable Respiratory status: spontaneous breathing, nonlabored ventilation, respiratory function stable and patient connected to nasal cannula oxygen Cardiovascular status: blood pressure returned to baseline and stable Postop Assessment: no apparent nausea or vomiting Anesthetic complications: no     Last Vitals:  Vitals:   08/16/17 1012 08/16/17 1022  BP: 129/61 123/75  Pulse: 72 71  Resp: 19 18  Temp:    SpO2: 97% 98%    Last Pain:  Vitals:   08/16/17 0942  TempSrc: Tympanic  PainSc: 0-No pain                 Precious Haws [name]

## 2017-08-16 NOTE — Anesthesia Preprocedure Evaluation (Signed)
Anesthesia Evaluation  Patient identified by MRN, date of birth, ID band Patient awake    Reviewed: Allergy & Precautions, H&P , NPO status , Patient's Chart, lab work & pertinent test results  History of Anesthesia Complications Negative for: history of anesthetic complications  Airway Mallampati: III  TM Distance: >3 FB Neck ROM: limited    Dental  (+) Poor Dentition, Missing, Upper Dentures, Lower Dentures   Pulmonary neg shortness of breath, former smoker,           Cardiovascular Exercise Tolerance: Good hypertension, (-) angina+ CAD, + CABG and + Peripheral Vascular Disease  (-) DOE      Neuro/Psych  Headaches, PSYCHIATRIC DISORDERS  Neuromuscular disease    GI/Hepatic Neg liver ROS, GERD  Medicated and Controlled,  Endo/Other  negative endocrine ROS  Renal/GU Renal disease  negative genitourinary   Musculoskeletal  (+) Arthritis ,   Abdominal   Peds  Hematology negative hematology ROS (+)   Anesthesia Other Findings Past Medical History: No date: Anemia of chronic kidney failure No date: Anxiety No date: CAD (coronary artery disease) No date: Carotid artery stenosis No date: Chronic kidney disease, stage III (moderate) (HCC)     Comment:  Followed by Dr. Juleen China No date: Hyperlipidemia No date: Hypertension No date: Renal artery stenosis (HCC) No date: Secondary hyperparathyroidism ([place]) No date: Spinal stenosis of lumbar region at multiple levels No date: Subclavian arterial stenosis ([place])  Past Surgical History: 1976: ABDOMINAL HYSTERECTOMY No date: CAROTID ARTERY ANGIOPLASTY; Left No date: CAROTID ENDARTERECTOMY; Left No date: CHOLECYSTECTOMY 2000: CORONARY ANGIOPLASTY WITH STENT PLACEMENT 2000: CORONARY ARTERY BYPASS GRAFT No date: CYSTOSCOPY WITH STENT PLACEMENT; Bilateral mARCH 2015: RENAL ARTERY ANGIOPLASTY; Bilateral No date: TOE AMPUTATION; Right     Comment:  small toe No date:  TONSILLECTOMY AND ADENOIDECTOMY No date: TOTAL HIP ARTHROPLASTY; Left 2015: TOTAL HIP ARTHROPLASTY; Right  BMI    Body Mass Index:  28.53 kg/m      Reproductive/Obstetrics negative OB ROS                             Anesthesia Physical Anesthesia Plan  ASA: III  Anesthesia Plan: General   Post-op Pain Management:    Induction: Intravenous  PONV Risk Score and Plan: Propofol infusion  Airway Management Planned: Natural Airway and Nasal Cannula  Additional Equipment:   Intra-op Plan:   Post-operative Plan:   Informed Consent: I have reviewed the patients History and Physical, chart, labs and discussed the procedure including the risks, benefits and alternatives for the proposed anesthesia with the patient or authorized representative who has indicated his/her understanding and acceptance.   Dental Advisory Given  Plan Discussed with: Anesthesiologist, CRNA and Surgeon  Anesthesia Plan Comments: (Patient consented for risks of anesthesia including but not limited to:  - adverse reactions to medications - risk of intubation if required - damage to teeth, lips or other oral mucosa - sore throat or hoarseness - Damage to heart, brain, lungs or loss of life  Patient voiced understanding.)        Anesthesia Quick Evaluation

## 2017-08-16 NOTE — H&P (Signed)
[name], MD [address], [place] [address] Phone: [phone] Fax : [phone]  Primary Care Physician:  Crecencio Mc, MD Primary Gastroenterologist:  Dr. Allen Norris  Pre-Procedure History & Physical: HPI:  Sabrina [name] is a 76 y.o. female is here for a screening colonoscopy.   Past Medical History:  Diagnosis Date  . Anemia of chronic kidney failure   . Anxiety   . CAD (coronary artery disease)   . Carotid artery stenosis   . Chronic kidney disease, stage III (moderate) (HCC)    Followed by Dr. Juleen China  . Hyperlipidemia   . Hypertension   . Renal artery stenosis ([place])   . Secondary hyperparathyroidism ([place])   . 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 Subclavian arterial stenosis [facility])     Past Surgical History:  Procedure Laterality Date  . ABDOMINAL HYSTERECTOMY  1976  . CAROTID ARTERY ANGIOPLASTY Left   . CAROTID ENDARTERECTOMY Left   . CHOLECYSTECTOMY    . CORONARY ANGIOPLASTY WITH STENT PLACEMENT  2000  . CORONARY ARTERY BYPASS GRAFT  2000  . CYSTOSCOPY WITH STENT PLACEMENT Bilateral   . RENAL ARTERY ANGIOPLASTY Bilateral mARCH 2015  . TOE AMPUTATION Right    small toe  . TONSILLECTOMY AND ADENOIDECTOMY    . TOTAL HIP ARTHROPLASTY Left   . TOTAL HIP ARTHROPLASTY Right 2015    Prior to Admission medications   Medication Sig Start Date End Date Taking? Authorizing Provider  amLODipine (NORVASC) 10 MG tablet TAKE 1 TABLET BY MOUTH ONCE DAILY 08/10/17  Yes Crecencio Mc, MD  aspirin 81 MG tablet Take 81 mg by mouth daily.   Yes [provider]  baclofen (LIORESAL) 10 MG tablet Take 1 tablet (10 mg total) by mouth 3 (three) times daily as needed. for muscle spams 02/11/17  Yes Crecencio Mc, MD  chlordiazePOXIDE (LIBRIUM) 25 MG capsule TAKE ONE CAPSULE BY MOUTH ONCE DAILY AS NEEDED FOR ANXIETY 03/28/17  Yes Crecencio Mc, MD  clopidogrel (PLAVIX) 75 MG tablet TAKE ONE TABLET BY MOUTH ONCE DAILY 11/12/16   Yes Crecencio Mc, MD  hydrALAZINE (APRESOLINE) 50 MG tablet TAKE ONE TABLET BY MOUTH THREE TIMES DAILY AS NEEDED 03/31/16  Yes Crecencio Mc, MD  labetalol (NORMODYNE) 200 MG tablet Take 0.5 tablets (100 mg total) by mouth 3 (three) times daily. 06/20/17  Yes Crecencio Mc, MD  lisinopril (PRINIVIL,ZESTRIL) 20 MG tablet Take 1 tablet (20 mg total) by mouth 2 (two) times daily. 07/04/17  Yes Crecencio Mc, MD  lisinopril (PRINIVIL,ZESTRIL) 40 MG tablet  06/19/17  Yes [provider]  NEXIUM 40 MG capsule Take 1 capsule (40 mg total) by mouth daily. 02/11/17  Yes Crecencio Mc, MD  NEXIUM 40 MG capsule TAKE ONE CAPSULE BY MOUTH ONCE DAILY 04/26/17  Yes Crecencio Mc, MD  Omega-3 Fatty Acids (FISH OIL) 1000 MG CAPS Take 1 capsule by mouth daily.   Yes [provider]  traMADol (ULTRAM) 50 MG tablet TAKE TWO TABLETS BY MOUTH EVERY 8 HOURS AS NEEDED 01/07/17  Yes Crecencio Mc, MD  ZETIA 10 MG tablet Take 1 tablet (10 mg total) by mouth daily. 10/19/16  Yes Crecencio Mc, MD  dicyclomine (BENTYL) 10 MG/5ML syrup Take 5 mLs (10 mg total) by mouth 4 (four) times daily -  before meals and at bedtime. Patient not taking: Reported on 07/04/2017 04/11/17   Crecencio Mc, MD  fentaNYL (  DURAGESIC) 75 MCG/HR Place 1 patch (75 mcg total) onto the skin every 3 (three) days. 07/04/17   Crecencio Mc, MD    Allergies as of 08/04/2017 - Review Complete 08/04/2017  Allergen Reaction Noted  . Citalopram  05/22/2015  . Dilaudid [hydromorphone hcl] Nausea And Vomiting 12/27/2013  . Hydrochlorothiazide Other (See Comments) 09/08/2014  . Nsaids  07/04/2014  . Nubain [nalbuphine hcl]  12/27/2013  . Penicillins Itching 12/27/2013  . Statins Itching 12/27/2013    Family History  Problem Relation Age of Onset  . Stroke Mother   . Hypertension Mother   . Diabetes Mother   . Hypertension Father   . Heart disease Sister        MI  . Multiple sclerosis Daughter   . Multiple sclerosis  Son   . Cerebral aneurysm Son   . Seizures Son   . Cerebral aneurysm Son   . Breast cancer Paternal Aunt 74    Social History   Social History  . Marital status: Divorced    Spouse name: N/A  . Number of children: N/A  . Years of education: N/A   Occupational History  . Not on file.   Social History Main Topics  . Smoking status: Former Smoker    Quit date: 10/25/1998  . Smokeless tobacco: Never Used  . Alcohol use No  . Drug use: No  . Sexual activity: Not Currently   Other Topics Concern  . Not on file   Social History Narrative  . No narrative on file    Review of Systems: See HPI, otherwise negative ROS  Physical Exam: BP (!) 165/72   Pulse 85   Temp (!) 96.7 F (35.9 C) (Tympanic)   Resp 20   Ht 5\' 1"  (1.549 m)   Wt 151 lb (68.5 kg)   SpO2 99%   BMI 28.53 kg/m  General:   Alert,  pleasant and cooperative in NAD Head:  Normocephalic and atraumatic. Neck:  Supple; no masses or thyromegaly. Lungs:  Clear throughout to auscultation.    Heart:  Regular rate and rhythm. Abdomen:  Soft, nontender and nondistended. Normal bowel sounds, without guarding, and without rebound.   Neurologic:  Alert and  oriented x4;  grossly normal neurologically.  Impression/Plan: Sabrina [name] is now here to undergo a screening colonoscopy.  Risks, benefits, and alternatives regarding colonoscopy have been reviewed with the patient.  Questions have been answered.  All parties agreeable.

## 2017-08-16 NOTE — Anesthesia Post-op Follow-up Note (Signed)
Anesthesia QCDR form completed.        

## 2017-08-16 NOTE — Op Note (Signed)
[facility] Gastroenterology Patient Name: Sabrina [name] Procedure Date: 08/16/2017 9:16 AM MRN: [id number] Account #: [id number] Date of Birth: 01/18/1941 Admit Type: Outpatient Age: 76 Room: [facility] ENDO ROOM 4 Gender: Female Note Status: Finalized Procedure:            Colonoscopy Indications:          Screening for colorectal malignant neoplasm Providers:            [name] MD, MD Referring MD:         Deborra Medina, MD (Referring MD) Medicines:            Propofol per Anesthesia Complications:        No immediate complications. Procedure:            Pre-Anesthesia Assessment:                       - Prior to the procedure, a History and Physical was                        performed, and patient medications and allergies were                        reviewed. The patient's tolerance of previous                        anesthesia was also reviewed. The risks and benefits of                        the procedure and the sedation options and risks were                        discussed with the patient. All questions were                        answered, and informed consent was obtained. Prior                        Anticoagulants: The patient has taken no previous                        anticoagulant or antiplatelet agents. ASA Grade                        Assessment: II - A patient with mild systemic disease.                        After reviewing the risks and benefits, the patient was                        deemed in satisfactory condition to undergo the                        procedure.                       After obtaining informed consent, the colonoscope was                        passed under direct vision. Throughout the procedure,  the patient's blood pressure, pulse, and oxygen                        saturations were monitored continuously. The                        Colonoscope was introduced through the anus and            advanced to the the cecum, identified by appendiceal                        orifice and ileocecal valve. The colonoscopy was                        performed without difficulty. The patient tolerated the                        procedure well. The quality of the bowel preparation                        was excellent. Findings:      The perianal and digital rectal examinations were normal.      Non-bleeding internal hemorrhoids were found during retroflexion. The       hemorrhoids were Grade I (internal hemorrhoids that do not prolapse). Impression:           - Non-bleeding internal hemorrhoids.                       - No specimens collected. Recommendation:       - Discharge patient to home.                       - Resume previous diet.                       - Continue present medications. Procedure Code(s):    --- Professional ---                       [phone], Colonoscopy, flexible; diagnostic, including                        collection of specimen(s) by brushing or washing, when                        performed (separate procedure) Diagnosis Code(s):    --- Professional ---                       Z12.11, Encounter for screening for malignant neoplasm                        of colon CPT copyright 2016 American Medical Association. All rights reserved. The codes documented in this report are preliminary and upon coder review may  be revised to meet current compliance requirements. [name] MD, MD 08/16/2017 9:38:41 AM This report has been signed electronically. Number of Addenda: 0 Note Initiated On: 08/16/2017 9:16 AM Scope Withdrawal Time: 0 hours 6 minutes 44 seconds  Total Procedure Duration: 0 hours 14 minutes 41 seconds       [facility]

## 2017-08-17 ENCOUNTER — Encounter: Payer: Self-pay | Admitting: Gastroenterology

## 2017-08-22 DIAGNOSIS — I701 Atherosclerosis of renal artery: Secondary | ICD-10-CM | POA: Diagnosis not present

## 2017-08-22 DIAGNOSIS — R809 Proteinuria, unspecified: Secondary | ICD-10-CM | POA: Diagnosis not present

## 2017-08-22 DIAGNOSIS — N183 Chronic kidney disease, stage 3 (moderate): Secondary | ICD-10-CM | POA: Diagnosis not present

## 2017-08-22 DIAGNOSIS — I129 Hypertensive chronic kidney disease with stage 1 through stage 4 chronic kidney disease, or unspecified chronic kidney disease: Secondary | ICD-10-CM | POA: Diagnosis not present

## 2017-08-23 ENCOUNTER — Ambulatory Visit: Payer: Medicare Other

## 2017-09-09 ENCOUNTER — Other Ambulatory Visit: Payer: Self-pay | Admitting: Internal Medicine

## 2017-09-09 MED ORDER — CLOPIDOGREL BISULFATE 75 MG PO TABS: 75.0000 mg | ORAL_TABLET | Freq: Every day | ORAL | 2 refills | Status: DC

## 2017-09-09 NOTE — Telephone Encounter (Signed)
LOV 07/04/2017 with Dr. Derrel Nip / Refill for plavix and bacolfen / Plavix refill processed / Baclofen routed to provider  copied from [place] [phone]. Topic: General - Other >> Sep 09, 2017  1:02 PM Yvette Rack wrote: Reason for CRM: patient calling for a refill on Plavix 75 mg and Baclofen 10 mg please send to the [facility] in [place] patient is completely out of Plavix

## 2017-09-12 ENCOUNTER — Ambulatory Visit (INDEPENDENT_AMBULATORY_CARE_PROVIDER_SITE_OTHER): Payer: Medicare Other

## 2017-09-12 ENCOUNTER — Encounter (INDEPENDENT_AMBULATORY_CARE_PROVIDER_SITE_OTHER): Payer: Self-pay | Admitting: Vascular Surgery

## 2017-09-12 ENCOUNTER — Ambulatory Visit (INDEPENDENT_AMBULATORY_CARE_PROVIDER_SITE_OTHER): Payer: Medicare Other | Admitting: Vascular Surgery

## 2017-09-12 VITALS — BP 112/62 | HR 73 | Resp 16 | Wt 158.0 lb

## 2017-09-12 DIAGNOSIS — I701 Atherosclerosis of renal artery: Secondary | ICD-10-CM

## 2017-09-12 DIAGNOSIS — I2510 Atherosclerotic heart disease of native coronary artery without angina pectoris: Secondary | ICD-10-CM | POA: Diagnosis not present

## 2017-09-12 DIAGNOSIS — I6523 Occlusion and stenosis of bilateral carotid arteries: Secondary | ICD-10-CM

## 2017-09-12 DIAGNOSIS — K551 Chronic vascular disorders of intestine: Secondary | ICD-10-CM | POA: Diagnosis not present

## 2017-09-12 DIAGNOSIS — I739 Peripheral vascular disease, unspecified: Secondary | ICD-10-CM | POA: Diagnosis not present

## 2017-09-18 ENCOUNTER — Encounter (INDEPENDENT_AMBULATORY_CARE_PROVIDER_SITE_OTHER): Payer: Self-pay | Admitting: Vascular Surgery

## 2017-09-18 NOTE — Progress Notes (Signed)
MRN : [id number]  Sabrina [name] is a 76 y.o. (1941-06-15) female who presents with chief complaint of  Chief Complaint  Patient presents with  . Follow-up    68yr renal,carotid  .  History of Present Illness: The patient is seen for follow up evaluation of carotid stenosis. The carotid stenosis followed by ultrasound.   The patient denies amaurosis fugax. There is no recent history of TIA symptoms or focal motor deficits. There is no prior documented CVA.  The patient is taking enteric-coated aspirin 81 mg daily.  There is no history of migraine headaches. There is no history of seizures.  She note occasional abdominal pain but it is not consistent and not associated with food.  She has not lost any weight.  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 Patient notes the pain is variable and not always associated with activity.  She notes that "rolling over in bed hurts".  Both legs are the same.  The pain is somewhat consistent day to day occurring on most days. The patient notes the pain also occurs with standing and routinely seems worse as the day wears on. The pain has been progressive over the past several years. The patient states these symptoms are causing  a profound negative impact on quality of life and daily activities.  The patient denies rest pain or dangling of an extremity off the side of the bed during the night for relief. No open wounds or sores at this time. No history of DVT or phlebitis.  There is a  history of back problems and DJD of the lumbar and sacral spine.     Current Meds  Medication Sig  . amLODipine (NORVASC) 10 MG tablet TAKE 1 TABLET BY MOUTH ONCE DAILY  . aspirin 81 MG tablet Take 81 mg by mouth daily.  . baclofen (LIORESAL) 10 MG tablet Take 1 tablet (10 mg total) by mouth 3 (three) times daily as needed. for muscle spams  . chlordiazePOXIDE (LIBRIUM)  25 MG capsule TAKE ONE CAPSULE BY MOUTH ONCE DAILY AS NEEDED FOR ANXIETY  . clopidogrel (PLAVIX) 75 MG tablet Take 1 tablet (75 mg total) daily by mouth.  . fentaNYL (DURAGESIC) 75 MCG/HR Place 1 patch (75 mcg total) onto the skin every 3 (three) days.  . hydrALAZINE (APRESOLINE) 50 MG tablet TAKE ONE TABLET BY MOUTH THREE TIMES DAILY AS NEEDED  . labetalol (NORMODYNE) 200 MG tablet Take 0.5 tablets (100 mg total) by mouth 3 (three) times daily.  Marland Kitchen lisinopril (PRINIVIL,ZESTRIL) 20 MG tablet Take 1 tablet (20 mg total) by mouth 2 (two) times daily.  Marland Kitchen lisinopril (PRINIVIL,ZESTRIL) 40 MG tablet   . NEXIUM 40 MG capsule Take 1 capsule (40 mg total) by mouth daily.  Marland Kitchen NEXIUM 40 MG capsule TAKE ONE CAPSULE BY MOUTH ONCE DAILY  . Omega-3 Fatty Acids (FISH OIL) 1000 MG CAPS Take 1 capsule by mouth daily.  . traMADol (ULTRAM) 50 MG tablet TAKE TWO TABLETS BY MOUTH EVERY 8 HOURS AS NEEDED  . ZETIA 10 MG tablet Take 1 tablet (10 mg total) by mouth daily.    Past Medical History:  Diagnosis Date  . Anemia of chronic kidney failure   . Anxiety   . CAD (coronary artery disease)   . Carotid artery stenosis   . Chronic kidney disease, stage III (moderate) (HCC)    Followed by Dr. Juleen China  .  Hyperlipidemia   . Hypertension   . Renal artery stenosis ([place])   . Secondary hyperparathyroidism ([place])   . 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 Subclavian arterial stenosis [facility])     Past Surgical History:  Procedure Laterality Date  . ABDOMINAL HYSTERECTOMY  1976  . CAROTID ARTERY ANGIOPLASTY Left   . CAROTID ENDARTERECTOMY Left   . CHOLECYSTECTOMY    . COLONOSCOPY WITH PROPOFOL N/A 08/16/2017   Procedure: COLONOSCOPY WITH PROPOFOL;  Surgeon: [name], MD;  Location: [facility] ENDOSCOPY;  Service: Endoscopy;  Laterality: N/A;  . CORONARY ANGIOPLASTY WITH STENT PLACEMENT  2000  . CORONARY ARTERY BYPASS GRAFT  2000  . CYSTOSCOPY WITH STENT PLACEMENT Bilateral   .  ESOPHAGOGASTRODUODENOSCOPY (EGD) WITH PROPOFOL N/A 08/16/2017   Procedure: ESOPHAGOGASTRODUODENOSCOPY (EGD) WITH PROPOFOL;  Surgeon: [name], MD;  Location: [facility] ENDOSCOPY;  Service: Endoscopy;  Laterality: N/A;  . RENAL ARTERY ANGIOPLASTY Bilateral mARCH 2015  . TOE AMPUTATION Right    small toe  . TONSILLECTOMY AND ADENOIDECTOMY    . TOTAL HIP ARTHROPLASTY Left   . TOTAL HIP ARTHROPLASTY Right 2015    Social History Social History   Tobacco Use  . Smoking status: Former Smoker    Last attempt to quit: 10/25/1998    Years since quitting: 18.9  . Smokeless tobacco: Never Used  Substance Use Topics  . Alcohol use: No    Alcohol/week: 0.0 oz  . Drug use: No    Family History Family History  Problem Relation Age of Onset  . Stroke Mother   . Hypertension Mother   . Diabetes Mother   . Hypertension Father   . Heart disease Sister        MI  . Multiple sclerosis Daughter   . Multiple sclerosis Son   . Cerebral aneurysm Son   . Seizures Son   . Cerebral aneurysm Son   . Breast cancer Paternal Aunt 68    Allergies  Allergen Reactions  . Citalopram     Throat closing   . Dilaudid [Hydromorphone Hcl] Nausea And Vomiting  . Hydrochlorothiazide Other (See Comments)    Decreased GFR (Nov 2015)  . Nsaids     CKD stage III - avoid nephrotoxic drugs  . Nubain [Nalbuphine Hcl]     Burning sensation in back  . Penicillins Itching  . Statins Itching     REVIEW OF SYSTEMS (Negative unless checked)  Constitutional: [] Weight loss  [] Fever  [] Chills Cardiac: [] Chest pain   [] Chest pressure   [] Palpitations   [] Shortness of breath when laying flat   [] Shortness of breath with exertion. Vascular:  [x] Pain in legs with walking   [x] Pain in legs at rest  [] History of DVT   [] Phlebitis   [] Swelling in legs   [] Varicose veins   [] Non-healing ulcers Pulmonary:   [] Uses home oxygen   [] Productive cough   [] Hemoptysis   [] Wheeze  [] COPD   [] Asthma Neurologic:  [] Dizziness    [] Seizures   [] History of stroke   [] History of TIA  [] Aphasia   [] Vissual changes   [] Weakness or numbness in arm   [] Weakness or numbness in leg Musculoskeletal:   [] Joint swelling   [] Joint pain   [] Low back pain Hematologic:  [] Easy bruising  [] Easy bleeding   [] Hypercoagulable state   [] Anemic Gastrointestinal:  [] Diarrhea   [] Vomiting  [] Gastroesophageal reflux/heartburn   [] Difficulty swallowing. Genitourinary:  [] Chronic kidney disease   [] Difficult urination  [] Frequent urination   [] Blood in urine  Skin:  [] Rashes   [] Ulcers  Psychological:  [] History of anxiety   []  History of major depression.  Physical Examination  Vitals:   09/12/17 1049  BP: 112/62  Pulse: 73  Resp: 16  Weight: 71.7 kg (158 lb)   Body mass index is 29.85 kg/m. Gen: WD/WN, NAD Head: [place]/AT, No temporalis wasting.  Ear/Nose/Throat: Hearing grossly intact, nares w/o erythema or drainage Eyes: PER, EOMI, sclera nonicteric.  Neck: Supple, no large masses.   Pulmonary:  Good air movement, no audible wheezing bilaterally, no use of accessory muscles.  Cardiac: RRR, no JVD Vascular:  Vessel Right Left  Radial Palpable Palpable  Carotid Palpable Palpable  PT Not Palpable Not Palpable  DP Not Palpable Not Palpable  Gastrointestinal: Non-distended. No guarding/no peritoneal signs.  Musculoskeletal: M/S 5/5 throughout.  No deformity or atrophy.  Neurologic: CN 2-12 intact. Symmetrical.  Speech is fluent. Motor exam as listed above. Psychiatric: Judgment intact, Mood & affect appropriate for pt's clinical situation. Dermatologic: No rashes or ulcers noted.  No changes consistent with cellulitis. Lymph : No lichenification or skin changes of chronic lymphedema.  CBC Lab Results  Component Value Date   WBC 5.4 07/06/2017   HGB 11.2 (L) 07/06/2017   HCT 33.6 (L) 07/06/2017   MCV 85.5 07/06/2017   PLT 264.0 07/06/2017    BMET    Component Value Date/Time   NA 137 07/06/2017 1440   NA 139 09/13/2014    NA 134 (L) 04/12/2014 0558   K 4.1 07/06/2017 1440   K 4.4 04/12/2014 0558   CL 101 07/06/2017 1440   CL 101 04/12/2014 0558   CO2 27 07/06/2017 1440   CO2 27 04/12/2014 0558   GLUCOSE 91 07/06/2017 1440   GLUCOSE 90 04/12/2014 0558   BUN 18 07/06/2017 1440   BUN 18 09/13/2014   BUN 16 04/12/2014 0558   CREATININE 1.20 07/06/2017 1440   CREATININE 1.12 04/12/2014 0558   CALCIUM 9.6 07/06/2017 1440   CALCIUM 8.5 04/12/2014 0558   GFRNONAA 44 (L) 02/07/2017 1627   GFRNONAA 49 (L) 04/12/2014 0558   GFRAA 51 (L) 02/07/2017 1627   GFRAA 57 (L) 04/12/2014 0558   CrCl cannot be calculated (Patient's most recent lab result is older than the maximum 21 days allowed.).  COAG Lab Results  Component Value Date   INR 1.0 03/27/2014    Radiology No results found.  Assessment/Plan 1. Bilateral carotid artery stenosis Recommend:  Given the patient's asymptomatic subcritical stenosis no further invasive testing or surgery at this time.   Continue antiplatelet therapy as prescribed Continue management of CAD, HTN and Hyperlipidemia Healthy heart diet,  encouraged exercise at least 4 times per week Follow up in 12 months with duplex ultrasound and physical exam   2. Peripheral vascular disease ([place]) Recommend:  I do not find evidence of Vascular pathology that would explain the patient's symptoms  The patient has atypical pain symptoms for vascular disease  Noninvasive studies including venous ultrasound of the legs do not identify vascular problems  The patient should continue walking and begin a more formal exercise program. The patient should continue his antiplatelet therapy and aggressive treatment of the lipid abnormalities. The patient should begin wearing graduated compression socks 15-20 mmHg strength to control her mild edema.  Further work-up of her lower extremity pain is deferred to the primary service     3. 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HCC) Recommend:  Given  the patient's asymptomatic stenosis of her mesenteric vessels no further invasive  testing or surgery at this time.  Continue antiplatelet therapy as prescribed Continue management of CAD, HTN and Hyperlipidemia Healthy heart diet,  encouraged exercise at least 4 times per week Follow up in 3 months with duplex ultrasound and physical exam   A total of 35 minutes was spent with this patient and greater than 50% was spent in counseling and coordination of care with the patient.  Discussion included the treatment options for vascular disease including indications for surgery and intervention.  Also discussed is the appropriate timing of treatment.  In addition medical therapy was discussed.   4. Coronary artery disease involving native coronary artery of native heart without angina pectoris Continue cardiac and antihypertensive medications as already ordered and reviewed, no changes at this time.  Continue statin as ordered and reviewed, no changes at this time  Nitrates PRN for chest pain     Hortencia Pilar, MD  09/18/2017 4:19 PM

## 2017-09-22 ENCOUNTER — Ambulatory Visit (INDEPENDENT_AMBULATORY_CARE_PROVIDER_SITE_OTHER): Payer: Medicare Other

## 2017-09-22 VITALS — BP 158/88 | HR 79 | Temp 98.3°F | Resp 18 | Ht 61.0 in | Wt 157.5 lb

## 2017-09-22 DIAGNOSIS — Z23 Encounter for immunization: Secondary | ICD-10-CM

## 2017-09-22 DIAGNOSIS — Z0000 Encounter for general adult medical examination without abnormal findings: Secondary | ICD-10-CM | POA: Diagnosis not present

## 2017-09-22 DIAGNOSIS — Z1331 Encounter for screening for depression: Secondary | ICD-10-CM

## 2017-09-22 NOTE — Patient Instructions (Addendum)
  Sabrina [name] , Thank you for taking time to come for your Medicare Wellness Visit. I appreciate your ongoing commitment to your health goals. Please review the following plan we discussed and let me know if I can assist you in the future.   Follow up with Dr. Derrel Nip as needed.    Have a great day!  These are the goals we discussed: Goals    . INCREASE WATER INTAKE     Finish entire cup of water when taking medications Stay hydrated and drink plenty of fluids       This is a list of the screening recommended for you and due dates:  Health Maintenance  Topic Date Due  . DEXA scan (bone density measurement)  02/18/2007  . Pneumonia vaccines (2 of 2 - PPSV23) 12/28/2014  . Flu Shot  05/25/2017  . Tetanus Vaccine  01/11/2024  . Colon Cancer Screening  08/17/2027

## 2017-09-22 NOTE — Progress Notes (Signed)
Subjective:   Sabrina [name] is a 76 y.o. female who presents for Medicare Annual (Subsequent) preventive examination.  Review of Systems:  No ROS.  Medicare Wellness Visit. Additional risk factors are reflected in the social history.  Cardiac Risk Factors include: advanced age (>24men, >76 women);hypertension     Objective:     Vitals: BP (!) 158/88 (BP Location: Left Arm, Patient Position: Sitting, Cuff Size: Normal)   Pulse 79   Temp 98.3 F (36.8 C) (Oral)   Resp 18   Ht 5\' 1"  (1.549 m)   Wt 157 lb 8 oz (71.4 kg)   SpO2 98%   BMI 29.76 kg/m   Body mass index is 29.76 kg/m.   Tobacco Social History   Tobacco Use  Smoking Status Former Smoker  . Last attempt to quit: 10/25/1998  . Years since quitting: 18.9  Smokeless Tobacco Never Used     Counseling given: Not Answered   Past Medical History:  Diagnosis Date  . Anemia of chronic kidney failure   . Anxiety   . CAD (coronary artery disease)   . Carotid artery stenosis   . Chronic kidney disease, stage III (moderate) (HCC)    Followed by Dr. Juleen China  . Hyperlipidemia   . Hypertension   . Renal artery stenosis ([place])   . Secondary hyperparathyroidism ([place])   . 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 Subclavian arterial stenosis [facility])    Past Surgical History:  Procedure Laterality Date  . ABDOMINAL HYSTERECTOMY  1976  . CAROTID ARTERY ANGIOPLASTY Left   . CAROTID ENDARTERECTOMY Left   . CHOLECYSTECTOMY    . COLONOSCOPY WITH PROPOFOL N/A 08/16/2017   Procedure: COLONOSCOPY WITH PROPOFOL;  Surgeon: [name], MD;  Location: [facility] ENDOSCOPY;  Service: Endoscopy;  Laterality: N/A;  . CORONARY ANGIOPLASTY WITH STENT PLACEMENT  2000  . CORONARY ARTERY BYPASS GRAFT  2000  . CYSTOSCOPY WITH STENT PLACEMENT Bilateral   . ESOPHAGOGASTRODUODENOSCOPY (EGD) WITH PROPOFOL N/A 08/16/2017   Procedure: ESOPHAGOGASTRODUODENOSCOPY (EGD) WITH PROPOFOL;  Surgeon: [name], MD;  Location: [facility]  ENDOSCOPY;  Service: Endoscopy;  Laterality: N/A;  . RENAL ARTERY ANGIOPLASTY Bilateral mARCH 2015  . TOE AMPUTATION Right    small toe  . TONSILLECTOMY AND ADENOIDECTOMY    . TOTAL HIP ARTHROPLASTY Left   . TOTAL HIP ARTHROPLASTY Right 2015   Family History  Problem Relation Age of Onset  . Stroke Mother   . Hypertension Mother   . Diabetes Mother   . Hypertension Father   . Heart disease Sister        MI  . Multiple sclerosis Daughter   . Multiple sclerosis Son   . Cerebral aneurysm Son   . Seizures Son   . Cerebral aneurysm Son   . Breast cancer Paternal Aunt 72   Social History   Substance and Sexual Activity  Sexual Activity Not Currently    Outpatient Encounter Medications as of 09/22/2017  Medication Sig  . amLODipine (NORVASC) 10 MG tablet TAKE 1 TABLET BY MOUTH ONCE DAILY  . aspirin 81 MG tablet Take 81 mg by mouth daily.  . baclofen (LIORESAL) 10 MG tablet Take 1 tablet (10 mg total) by mouth 3 (three) times daily as needed. for muscle spams  . chlordiazePOXIDE (LIBRIUM) 25 MG capsule TAKE ONE CAPSULE BY MOUTH ONCE DAILY AS NEEDED FOR ANXIETY  . clopidogrel (PLAVIX) 75 MG tablet Take 1 tablet (75 mg total) daily by mouth.  . fentaNYL ([place])  75 MCG/HR Place 1 patch (75 mcg total) onto the skin every 3 (three) days.  . hydrALAZINE (APRESOLINE) 50 MG tablet TAKE ONE TABLET BY MOUTH THREE TIMES DAILY AS NEEDED  . labetalol (NORMODYNE) 200 MG tablet Take 0.5 tablets (100 mg total) by mouth 3 (three) times daily.  Marland Kitchen lisinopril (PRINIVIL,ZESTRIL) 20 MG tablet Take 1 tablet (20 mg total) by mouth 2 (two) times daily.  Marland Kitchen NEXIUM 40 MG capsule Take 1 capsule (40 mg total) by mouth daily.  Marland Kitchen NEXIUM 40 MG capsule TAKE ONE CAPSULE BY MOUTH ONCE DAILY  . Omega-3 Fatty Acids (FISH OIL) 1000 MG CAPS Take 1 capsule by mouth daily.  . traMADol (ULTRAM) 50 MG tablet TAKE TWO TABLETS BY MOUTH EVERY 8 HOURS AS NEEDED  . ZETIA 10 MG tablet Take 1 tablet (10 mg total) by mouth  daily.  Marland Kitchen dicyclomine (BENTYL) 10 MG/5ML syrup Take 5 mLs (10 mg total) by mouth 4 (four) times daily -  before meals and at bedtime. (Patient not taking: Reported on 09/22/2017)  . lisinopril (PRINIVIL,ZESTRIL) 40 MG tablet    No facility-administered encounter medications on file as of 09/22/2017.     Activities of Daily Living In your present state of health, do you have any difficulty performing the following activities: 09/22/2017 09/22/2016  Hearing? N N  Vision? N N  Difficulty concentrating or making decisions? N N  Walking or climbing stairs? Y N  Comment Chronic back pain, intermittent -  Dressing or bathing? N N  Doing errands, shopping? N N  Preparing Food and eating ? N N  Using the Toilet? N N  In the past six months, have you accidently leaked urine? Y N  Comment Managed with daily pad -  Do you have problems with loss of bowel control? N N  Managing your Medications? N N  Managing your Finances? N N  Housekeeping or managing your Housekeeping? N N  Some recent data might be hidden    Patient Care Team: Crecencio Mc, MD as PCP - General (Internal Medicine)    Assessment:    This is a routine wellness examination for Sabrina [name]. The goal of the wellness visit is to assist the patient how to close the gaps in care and create a preventative care plan for the patient.   The roster of all physicians providing medical care to patient is listed in the Snapshot section of the chart.  Taking calcium VIT D as appropriate/Osteoporosis risk reviewed.    Safety issues reviewed; Smoke and carbon monoxide detectors in the home. No firearms in the home.  Wears seatbelts when driving or riding with others. Patient does wear sunscreen or protective clothing when in direct sunlight. No violence in the home.  Depression- PHQ 2 &9 complete.  No signs/symptoms or verbal communication regarding little pleasure in doing things, feeling down, depressed or hopeless. No changes in  sleeping, energy, eating, concentrating.  No thoughts of self harm or harm towards others.  Time spent on this topic is 12 minutes.   Patient is alert, normal appearance, oriented to person/place/and time. Correctly identified the president of the [place], recall of 2/3 words, and performing simple calculations. Displays appropriate judgement and can read correct time from watch face.   No new identified risk were noted.  No failures at ADL's or IADL's.    BMI- discussed the importance of a healthy diet, water intake and the benefits of aerobic exercise. Educational material provided.   24 hour diet recall:  Low sodium diet  Daily fluid intake: 1 cups of caffeine, 0 cups of water  Eye- Visual acuity not assessed per patient preference since they have regular follow up with the ophthalmologist.  Wears corrective lenses.  Sleep patterns- Sleeps 8 hours at night.  Wakes feeling rested.   Influenza vaccine administered L deltoid, tolerated well. Educational material provided.  Dexa Scan deferred per patient preference.  Pneumonia 23 vaccine deferred per patient preference.  Patient Concerns: None at this time. Follow up with PCP as needed.  Exercise Activities and Dietary recommendations Current Exercise Habits: The patient does not participate in regular exercise at present  Goals    . INCREASE WATER INTAKE     Finish entire cup of water when taking medications Stay hydrated and drink plenty of fluids      Fall Risk Fall Risk  09/22/2017 09/22/2016 06/24/2014  Falls in the past year? No No No   Depression Screen PHQ 2/9 Scores 09/22/2017 09/22/2016 06/24/2014  PHQ - 2 Score 0 0 0  PHQ- 9 Score 0 - -     Cognitive Function MMSE - Mini Mental State Exam 09/22/2017  Orientation to time 5  Orientation to Place 5  Registration 3  Attention/ Calculation 5  Recall 3  Language- name 2 objects 2  Language- repeat 1  Language- follow 3 step command 3  Language- read & follow  direction 1  Write a sentence 1  Copy design 1  Total score 30     6CIT Screen 09/22/2016  What Year? 0 points  What month? 0 points  What time? 0 points  Count back from 20 0 points  Months in reverse 0 points  Repeat phrase 0 points  Total Score 0    Immunization History  Administered Date(s) Administered  . Influenza,inj,Quad PF,6+ Mos 06/24/2014, 08/26/2016, 09/22/2017  . Pneumococcal Conjugate-13 12/27/2013  . Tdap 01/10/2014   Screening Tests Health Maintenance  Topic Date Due  . DEXA SCAN  02/18/2007  . PNA vac Low Risk Adult (2 of 2 - PPSV23) 12/28/2014  . INFLUENZA VACCINE  05/25/2017  . TETANUS/TDAP  01/11/2024  . COLONOSCOPY  08/17/2027      Plan:   End of life planning; Advanced aging; Advanced directives discussed.  No HCPOA/Living Will.  Additional information declined at this time.  I have personally reviewed and noted the following in the patient's chart:   . Medical and social history . Use of alcohol, tobacco or illicit drugs  . Current medications and supplements . Functional ability and status . Nutritional status . Physical activity . Advanced directives . List of other physicians . Hospitalizations, surgeries, and ER visits in previous 12 months . Vitals . Screenings to include cognitive, depression, and falls . Referrals and appointments  In addition, I have reviewed and discussed with patient certain preventive protocols, quality metrics, and best practice recommendations. A written personalized care plan for preventive services as well as general preventive health recommendations were provided to patient.     Varney Biles, LPN  16/07/9603   Reviewed above information.  Agree with assessment and plan.    Dr Nicki Reaper

## 2017-09-28 ENCOUNTER — Other Ambulatory Visit: Payer: Self-pay | Admitting: Internal Medicine

## 2017-09-28 ENCOUNTER — Encounter (INDEPENDENT_AMBULATORY_CARE_PROVIDER_SITE_OTHER): Payer: Self-pay

## 2017-09-29 ENCOUNTER — Telehealth: Payer: Self-pay | Admitting: Internal Medicine

## 2017-09-29 NOTE — Telephone Encounter (Signed)
Medication    Change   Sabrina [name]

## 2017-09-29 NOTE — Telephone Encounter (Deleted)
Copied from [place] [id number]. Topic: Quick Communication - See Telephone Encounter >> Sep 29, 2017 12:50 PM Hewitt Shorts wrote: CRM for notification. See Telephone encounter for: pt is at the pharmacy waiting to get the ok to change the zetia changed from brand to generic due to cost   Again she is at the pharmacy [phone]  09/29/17.

## 2017-09-29 NOTE — Telephone Encounter (Unsigned)
Copied from [place]. Topic: Quick Communication - See Telephone Encounter >> Sep 29, 2017 12:48 PM Hewitt Shorts wrote: CRM for notification. See Telephone encounter for: pt is at the pharmacy waiting for an answer but she is wanting to get the ok to change the medication zetia from brand to generic because of cost   Best number [phone] pt is waiting at the pharmacy   09/29/17.

## 2017-09-29 NOTE — Telephone Encounter (Signed)
Copied from [place]. Topic: Quick Communication - See Telephone Encounter >> Sep 29, 2017 12:48 PM Hewitt Shorts wrote: CRM for notification. See Telephone encounter for: pt is at the pharmacy waiting for an answer but she is wanting to get the ok to change the medication zetia from brand to generic because of cost   Best number [phone] pt is waiting at the pharmacy   09/29/17.

## 2017-09-29 NOTE — Telephone Encounter (Signed)
Please advise 

## 2017-09-30 ENCOUNTER — Telehealth: Payer: Self-pay | Admitting: Internal Medicine

## 2017-09-30 ENCOUNTER — Other Ambulatory Visit: Payer: Self-pay

## 2017-09-30 MED ORDER — EZETIMIBE 10 MG PO TABS: 10.0000 mg | ORAL_TABLET | Freq: Every day | ORAL | 5 refills | Status: DC

## 2017-09-30 NOTE — Telephone Encounter (Signed)
Spoke with pt to let her know that the message she was referring to was from this morning and I had already spoken to her.

## 2017-09-30 NOTE — Telephone Encounter (Signed)
Patient is calling  again, very upset nothing has been done. I called office directly, no answer. Patient states she is going to office. I told I would send message.

## 2017-09-30 NOTE — Telephone Encounter (Signed)
Please advise 

## 2017-09-30 NOTE — Telephone Encounter (Signed)
Copied from [place] [phone]. Topic: Quick Communication - Office Called Patient >> Sep 30, 2017  3:16 PM Cecelia Byars, [place] wrote: Reason for SUN:HRVACQP returned call from [facility]

## 2017-09-30 NOTE — Telephone Encounter (Signed)
Spoke with pt to let her know that the medication has been changed to the generic zetia. Pt stated that the brand name was too expensive. She also stated that she was going to try it and if it does not work for her then she will let us know to switch it back to the zetia.

## 2017-10-11 ENCOUNTER — Other Ambulatory Visit: Payer: Self-pay | Admitting: Internal Medicine

## 2017-10-11 NOTE — Telephone Encounter (Signed)
Copied from [place] [id number]. Topic: Quick Communication - See Telephone Encounter >> Oct 11, 2017  1:58 PM Ether Griffins B wrote: CRM for notification. See Telephone encounter for:  Pt needing refill on hydralazine and librium  10/11/17.

## 2017-10-11 NOTE — Telephone Encounter (Signed)
Pt needs a refill of lisinopril 20mg . Uses [facility] [address].

## 2017-10-20 ENCOUNTER — Telehealth: Payer: Self-pay | Admitting: Internal Medicine

## 2017-10-20 ENCOUNTER — Other Ambulatory Visit: Payer: Self-pay

## 2017-10-20 MED ORDER — CHLORDIAZEPOXIDE HCL 25 MG PO CAPS: ORAL_CAPSULE | ORAL | 5 refills | Status: DC

## 2017-10-20 NOTE — Telephone Encounter (Signed)
Printed, signed and faxed.  

## 2017-10-20 NOTE — Telephone Encounter (Signed)
Refilled: 03/28/2017 Last OV: 07/04/2017 Next OV: not scheduled

## 2017-10-20 NOTE — Telephone Encounter (Signed)
Fentanyl refill. Last OV 07/04/17. Last refill 08/09/17.

## 2017-10-20 NOTE — Telephone Encounter (Signed)
Copied from [place]. Topic: Quick Communication - See Telephone Encounter >> Oct 20, 2017 11:25 AM Burnis Medin, NT wrote: CRM for notification. See Telephone encounter for: Pt called in and said she need a medication refill for fentaNYL (DURAGESIC) 75 MCG/HR. Pt uses   [facility] [facility] ([address] [place] [address]     10/20/17.

## 2017-10-21 MED ORDER — FENTANYL 75 MCG/HR TD PT72: 75.0000 ug | MEDICATED_PATCH | TRANSDERMAL | 0 refills | Status: DC

## 2017-10-21 NOTE — Telephone Encounter (Signed)
Refill for 30 days only.  OFFICE VISIT NEEDED prior to any more refills 

## 2017-10-21 NOTE — Telephone Encounter (Signed)
Please advise 

## 2017-10-21 NOTE — Telephone Encounter (Signed)
Spoke with pt to let her know that we have her rx ready to be picked up. Also scheduled the pt a follow up appt with Dr. Derrel Nip. Pt is aware of appt date and time.

## 2017-10-26 DIAGNOSIS — Z961 Presence of intraocular lens: Secondary | ICD-10-CM | POA: Diagnosis not present

## 2017-11-01 ENCOUNTER — Other Ambulatory Visit: Payer: Self-pay | Admitting: Internal Medicine

## 2017-11-02 ENCOUNTER — Telehealth: Payer: Self-pay | Admitting: Internal Medicine

## 2017-11-02 ENCOUNTER — Other Ambulatory Visit: Payer: Self-pay | Admitting: Internal Medicine

## 2017-11-02 NOTE — Telephone Encounter (Signed)
Please advise 

## 2017-11-02 NOTE — Telephone Encounter (Signed)
Pt needs a refill on her NEXIUM 40 MG capsule.

## 2017-11-02 NOTE — Telephone Encounter (Signed)
Copied from [place] [id number]. Topic: General - Other >> Nov 02, 2017  1:05 PM Yvette Rack wrote: Reason for CRM: patient calling stating that its time for Dr Derrel Nip to write her letter to [facility] for Zetia it need to say that the provider is helping her with this medication patient states that Dr Derrel Nip writes her a letter for this each year

## 2017-11-02 NOTE — Telephone Encounter (Signed)
Pt was in today to let us know that she can not take the generic version, it makes her sick.

## 2017-11-03 ENCOUNTER — Telehealth: Payer: Self-pay | Admitting: Internal Medicine

## 2017-11-03 NOTE — Telephone Encounter (Signed)
Pt wanted Dr Derrel Nip not to send PA letter to [facility] today until her appt tomorrow for Zetia and Nexium, she also stated if the Tier can be brought down to 1 or 2. Please advise? Thank you!

## 2017-11-04 ENCOUNTER — Telehealth: Payer: Self-pay | Admitting: Internal Medicine

## 2017-11-04 ENCOUNTER — Other Ambulatory Visit: Payer: Self-pay | Admitting: Internal Medicine

## 2017-11-04 ENCOUNTER — Ambulatory Visit (INDEPENDENT_AMBULATORY_CARE_PROVIDER_SITE_OTHER): Payer: Medicare Other | Admitting: Internal Medicine

## 2017-11-04 ENCOUNTER — Encounter: Payer: Self-pay | Admitting: Internal Medicine

## 2017-11-04 VITALS — BP 154/80 | HR 79 | Temp 97.8°F | Resp 17 | Ht 61.0 in | Wt 160.4 lb

## 2017-11-04 DIAGNOSIS — M47812 Spondylosis without myelopathy or radiculopathy, cervical region: Secondary | ICD-10-CM

## 2017-11-04 DIAGNOSIS — R55 Syncope and collapse: Secondary | ICD-10-CM

## 2017-11-04 DIAGNOSIS — M5416 Radiculopathy, lumbar region: Secondary | ICD-10-CM

## 2017-11-04 DIAGNOSIS — R0981 Nasal congestion: Secondary | ICD-10-CM | POA: Diagnosis not present

## 2017-11-04 DIAGNOSIS — R002 Palpitations: Secondary | ICD-10-CM

## 2017-11-04 DIAGNOSIS — R42 Dizziness and giddiness: Secondary | ICD-10-CM | POA: Diagnosis not present

## 2017-11-04 MED ORDER — NEXIUM 40 MG PO CPDR: 40.0000 mg | DELAYED_RELEASE_CAPSULE | Freq: Every day | ORAL | 11 refills | Status: DC

## 2017-11-04 MED ORDER — PREDNISONE 10 MG PO TABS
ORAL_TABLET | ORAL | 0 refills | Status: DC
Start: 2017-11-04 — End: 2018-03-27

## 2017-11-04 MED ORDER — MAGNESIUM OXIDE 400 (241.3 MG) MG PO TABS: 400.0000 mg | ORAL_TABLET | Freq: Every day | ORAL | 0 refills | Status: DC

## 2017-11-04 MED ORDER — FENTANYL 75 MCG/HR TD PT72: 75.0000 ug | MEDICATED_PATCH | TRANSDERMAL | 0 refills | Status: DC

## 2017-11-04 MED ORDER — BACLOFEN 10 MG PO TABS: ORAL_TABLET | ORAL | 5 refills | Status: DC

## 2017-11-04 MED ORDER — BACLOFEN 10 MG PO TABS: 10.0000 mg | ORAL_TABLET | Freq: Three times a day (TID) | ORAL | 5 refills | Status: DC | PRN

## 2017-11-04 NOTE — Telephone Encounter (Signed)
Nexium refilled 

## 2017-11-04 NOTE — Telephone Encounter (Signed)
Spoke with Dr. Nicki Reaper about the pt's prednisone taper. Rx wasn't sent in and pt was there to pick rx up. Dr. Nicki Reaper sent in the 10mg  prednisone taper for 6 tablets the first day and reduce by one everyday until gone. Attempted to call pt to let her know that she can just take two of the baclofen that was sent in to equal the dose that Dr. Derrel Nip told her to increase to during her office visit.

## 2017-11-04 NOTE — Telephone Encounter (Signed)
Copied from [place] [phone]. Topic: Inquiry >> Nov 04, 2017  5:39 PM Cecelia Byars, NT wrote: Reason for CRM: Pharmacy called and has questions about  prednisone no prescription was received , baclosen  does not match patients paper work ,please call the  pharmacist  [phone]

## 2017-11-04 NOTE — Patient Instructions (Addendum)
For your sinus congestion:  You can use Afriin spray twice daily for maximum of 5 days   I am prescribing Prednisone in a tapering dose dose for 6 days as follows:   6 tablets all at once on Day 1,  Then taper by 1 tablet daily until gone  For your leg cramps:   Increase the baclofen to 20 mg at bedtime to help the muscle cramps    Adding magnesium at night as well    You can also try soaking in a hot bath with Epsom salts in the evening  and then stretching legs using a  Thera Band    I am making a referral to Dr Clayborn Bigness to have you evaluated for irregular heart rhythms  If we are unable to get  Your insurance to lower the Zetia to an affordable cost,  You should consider the injectible alternative called Repatha.  Dr Clayborn Bigness can talk to you about this as well.   If we are unable to get the Nexium price lowered,  It can  be bought over the counter at the 20 mg dose (you can take 2)

## 2017-11-04 NOTE — Telephone Encounter (Signed)
Please advise 

## 2017-11-04 NOTE — Telephone Encounter (Signed)
Patient was in office today with Dr. Derrel Nip and this was addressed.

## 2017-11-04 NOTE — Progress Notes (Signed)
Subjective:  Patient ID: Sabrina [name], female    DOB: 27-Oct-1940  Age: 77 y.o. MRN: [id number]  CC: The primary encounter diagnosis was Palpitations. Diagnoses of Postural dizziness with near syncope, Lumbar radiculitis, Cervical spondylosis without myelopathy, and Sinus congestion were also pertinent to this visit.  HPI Sabrina [name] presents for evalutaiton of multiple acute and chronic complaints,    1) sinus congestion without a runny nose,   No fevers,  No Cough .  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  Doesn't drink water because it makes me "strangle" (but  iced tea does not!)   4) dizziness  (chronic) without vertigo.  Feels like she is going to fall all the time Had an episode yesterday while in [place] she was going to die,  Felt a little better after sucking on a  Peppermint.  Has not seen her cardiologist Dr Clayborn Bigness . Marland Kitchen Denies chest pain,  But develops tightness in throat and becomes short of breath .   5) tingling in fingers.  history of cervical spondylosis and spinal stenosis from C4-C67 by 2016 MRI   6) palpitations (reported while  Being checked in )   Trouble affording medications.  Requesting a letter be written to  [facility] for coverage of Name brad only  nexium and Zetia .  Her cost has increased form $4 to $29.60  And nexium is now costing  $86 ver the past month      Outpatient Medications Prior to Visit  Medication Sig Dispense Refill  . amLODipine (NORVASC) 10 MG tablet TAKE 1 TABLET BY MOUTH ONCE DAILY 90 tablet 0  . aspirin 81 MG tablet Take 81 mg by mouth daily.    . chlordiazePOXIDE (LIBRIUM) 25 MG capsule TAKE ONE CAPSULE BY MOUTH ONCE DAILY AS NEEDED FOR ANXIETY 30 capsule 5  . clopidogrel (PLAVIX) 75 MG tablet Take 1  tablet (75 mg total) daily by mouth. 90 tablet 2  . dicyclomine (BENTYL) 10 MG/5ML syrup Take 5 mLs (10 mg total) by mouth 4 (four) times daily -  before meals and at bedtime. 473 mL 12  . ezetimibe (ZETIA) 10 MG tablet Take 1 tablet (10 mg total) by mouth daily. 30 tablet 5  . hydrALAZINE (APRESOLINE) 50 MG tablet TAKE ONE TABLET BY MOUTH THREE TIMES DAILY AS NEEDED 270 tablet 0  . labetalol (NORMODYNE) 200 MG tablet Take 0.5 tablets (100 mg total) by mouth 3 (three) times daily. 90 tablet 2  . lisinopril (PRINIVIL,ZESTRIL) 20 MG tablet TAKE 1 TABLET BY MOUTH TWICE DAILY 180 tablet 0  . lisinopril (PRINIVIL,ZESTRIL) 40 MG tablet     . Omega-3 Fatty Acids (FISH OIL) 1000 MG CAPS Take 1 capsule by mouth daily.    . traMADol (ULTRAM) 50 MG tablet TAKE TWO TABLETS BY MOUTH EVERY 8 HOURS AS NEEDED 180 tablet 3  . baclofen (LIORESAL) 10 MG tablet Take 1 tablet (10 mg total) by mouth 3 (three) times daily as needed. for muscle spams 90 tablet 5  . fentaNYL (DURAGESIC) 75 MCG/HR Place 1 patch (75 mcg total) onto the skin every 3 (three) days. 10 patch 0  . NEXIUM 40 MG capsule Take 1 capsule (40 mg total)  by mouth daily. 90 capsule 3  . NEXIUM 40 MG capsule TAKE ONE CAPSULE BY MOUTH ONCE DAILY 30 capsule 11   No facility-administered medications prior to visit.     Review of Systems;  Patient denies headache, fevers, malaise, unintentional weight loss, skin rash, eye pain, sinus congestion and sinus pain, sore throat, dysphagia,  hemoptysis , cough, dyspnea, wheezing, chest pain, palpitations, orthopnea, edema, abdominal pain, nausea, melena, diarrhea, constipation, flank pain, dysuria, hematuria, urinary  Frequency, nocturia, numbness, tingling, seizures,  Focal weakness, Loss of consciousness,  Tremor, insomnia, depression, anxiety, and suicidal ideation.      Objective:  BP (!) 154/80 (BP Location: Left Arm, Patient Position: Sitting, Cuff Size: Normal)   Pulse 79   Temp 97.8 F (36.6 C)  (Oral)   Resp 17   Ht 5\' 1"  (1.549 m)   Wt 160 lb 6.4 oz (72.8 kg)   SpO2 99%   BMI 30.31 kg/m   BP Readings from Last 3 Encounters:  11/04/17 (!) 154/80  09/22/17 (!) 158/88  09/12/17 112/62    Wt Readings from Last 3 Encounters:  11/04/17 160 lb 6.4 oz (72.8 kg)  09/22/17 157 lb 8 oz (71.4 kg)  09/12/17 158 lb (71.7 kg)    General appearance: alert, cooperative and appears stated age Ears: normal TM's and external ear canals both ears Throat: lips, mucosa, and tongue normal; teeth and gums normal Neck: no adenopathy, no carotid bruit, supple, symmetrical, trachea midline and thyroid not enlarged, symmetric, no tenderness/mass/nodules Back: symmetric, no curvature. ROM normal. No CVA tenderness. Lungs: clear to auscultation bilaterally Heart: regular rate and rhythm, S1, S2 normal, no murmur, click, rub or gallop Abdomen: soft, non-tender; bowel sounds normal; no masses,  no organomegaly Pulses: 2+ and symmetric Skin: Skin color, texture, turgor normal. No rashes or lesions Lymph nodes: Cervical, supraclavicular, and axillary nodes normal.  Lab Results  Component Value Date   HGBA1C 5.9 07/19/2016   HGBA1C 5.8 06/02/2015    Lab Results  Component Value Date   CREATININE 1.20 07/06/2017   CREATININE 1.18 (H) 02/07/2017   CREATININE 1.15 07/19/2016    Lab Results  Component Value Date   WBC 5.4 07/06/2017   HGB 11.2 (L) 07/06/2017   HCT 33.6 (L) 07/06/2017   PLT 264.0 07/06/2017   GLUCOSE 91 07/06/2017   CHOL 167 07/19/2016   TRIG 55.0 07/19/2016   HDL 57.90 07/19/2016   LDLDIRECT 109.0 03/05/2016   LDLCALC 98 07/19/2016   ALT 9 07/06/2017   AST 16 07/06/2017   NA 137 07/06/2017   K 4.1 07/06/2017   CL 101 07/06/2017   CREATININE 1.20 07/06/2017   BUN 18 07/06/2017   CO2 27 07/06/2017   TSH 4.440 09/02/2016   INR 1.0 03/27/2014   HGBA1C 5.9 07/19/2016    No results found.  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  Continue fentanyl transdermal for pain management as she is  a poor candidate for surgery and refuses pain management/ESI.  Increase baclofen to 20 mg ,  Trial of hot baths with epsom salts.       Relevant Medications   baclofen (LIORESAL) 10 MG tablet   Palpitations - Primary    Reported today at check in, with recent poorly described near syncopal episode.  An  EKG was done today, which I have reviewed  There is  no evidence of arrhythmia or ischemia.  Referral to Dr Clayborn Bigness for evaluation .       Relevant Orders   EKG 12-Lead (Completed)   Ambulatory referral to Cardiology   Sinus congestion    Afrin plus prednisone taper.  NSAIDs and decongestants c/i secondary to hypertension        Other Visit Diagnoses    Postural dizziness with near syncope       Relevant Orders   Ambulatory referral to Cardiology      I have discontinued Rikayla A. Tempesta's NEXIUM, baclofen, fentaNYL, and fentaNYL. I have also changed her fentaNYL and baclofen. Additionally, I am having her start on magnesium oxide. Lastly, I am having her maintain her aspirin, Fish Oil, traMADol, dicyclomine, labetalol, lisinopril, clopidogrel, ezetimibe, hydrALAZINE, lisinopril, chlordiazePOXIDE, and amLODipine.  Meds ordered this encounter  Medications  . DISCONTD: baclofen (LIORESAL) 10 MG tablet    Sig: Take 1 tablet (10 mg total) by mouth 3 (three) times daily as needed. for muscle spams    Dispense:  90 tablet    Refill:  5    Please  consider 90 day supplies to promote better adherence  . magnesium oxide (MAG-OX) 400 (241.3 Mg) MG tablet    Sig: Take 1 tablet (400 mg total) by mouth daily. At bedtime    Dispense:  30 tablet    Refill:  0  . DISCONTD: fentaNYL (DURAGESIC) 75 MCG/HR    Sig: Place 1 patch (75 mcg total) onto the skin every 3 (three) days.    Dispense:  10 patch    Refill:  0  . DISCONTD: fentaNYL (DURAGESIC) 75 MCG/HR    Sig: Place 1 patch (75 mcg total) onto the skin every 3 (three) days. May refill on or after Dec 05 2017    Dispense:  10 patch    Refill:  0  . fentaNYL (DURAGESIC) 75 MCG/HR    Sig: Place 1 patch (75 mcg total) onto the skin every 3 (three) days. May refill on or after January 02 2018    Dispense:  10 patch    Refill:  0  . baclofen (LIORESAL) 10 MG tablet    Sig: Take 2 tablets at bedtime and during the day as needed for muscle spasm    Dispense:  120 tablet    Refill:  5    Please consider 90 day supplies to promote better adherence    Medications Discontinued During This Encounter  Medication Reason  . baclofen (LIORESAL) 10 MG tablet   . fentaNYL (DURAGESIC) 75 MCG/HR Reorder  . NEXIUM 40 MG capsule   . fentaNYL (DURAGESIC) 75 MCG/HR Reorder  . fentaNYL (DURAGESIC) 75 MCG/HR Reorder  . baclofen (LIORESAL) 10 MG tablet     Follow-up: No Follow-up on file.   Crecencio Mc, MD

## 2017-11-04 NOTE — Progress Notes (Signed)
Pt of Dr Derrel Nip.  Prednisone was not called in.  Reviewed note.  Prednisone 10mg  (starting with 6 tablets and taper)

## 2017-11-06 DIAGNOSIS — R0981 Nasal congestion: Secondary | ICD-10-CM | POA: Insufficient documentation

## 2017-11-06 NOTE — Assessment & Plan Note (Signed)
Secondary to cervical spinal stenosis of varying degrees from c4 to C7 ;  Discussed referral to pain management for ESI trial. She is receptive to the idea but wants to pick the provider

## 2017-11-06 NOTE — Assessment & Plan Note (Addendum)
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  Continue fentanyl transdermal for pain management as she is a poor candidate for surgery and refuses pain management/ESI.  Increase baclofen to 20 mg ,  Trial of hot baths with epsom salts.

## 2017-11-06 NOTE — Assessment & Plan Note (Signed)
Afrin plus prednisone taper.  NSAIDs and decongestants c/i secondary to hypertension

## 2017-11-06 NOTE — Assessment & Plan Note (Addendum)
Reported today at check in, with recent poorly described near syncopal episode.  An  EKG was done today, which I have reviewed  There is  no evidence of arrhythmia or ischemia.  Referral to Dr Clayborn Bigness for evaluation .

## 2017-11-07 NOTE — Telephone Encounter (Signed)
Spoke with pt and informed her that we got the prednisone sent in Friday night around 6pm. The pt stated that the pharmacy never called her but that she would get up now and go get the medication. Also explained to pt that Dr. Derrel Nip stated at her visit that she could increase the baclofen to 20mg  TID if needed for muscle spasms and she could just take two of the 10mg  at time. The pt gave a verbal understanding.

## 2017-11-09 ENCOUNTER — Telehealth: Payer: Self-pay

## 2017-11-09 NOTE — Telephone Encounter (Signed)
Copied from [place] [id number]. Topic: Appointment Scheduling - Scheduling Inquiry for Clinic >> Nov 09, 2017 10:35 AM [name] wrote: Reason for CRM:: left vm for pt to rtc. [facility] can give appt info to patient. Dr. Derrel Nip has referred her to Dr. Marianna Payment office at [facility]. She is scheduled tomorrow-Thursday Jan. 17 at 11:30 am. If she can not make this appointment she will need to call [phone] to reschedule.

## 2017-11-10 NOTE — Telephone Encounter (Signed)
Spoke with Sabrina [name] and she stated that when she got the message yesterday to return our call it was to late so she was not aware of this appt yesterday with Dr. Clayborn Bigness. Sabrina [name] has been given the number to call their office to reschedule.

## 2017-11-11 ENCOUNTER — Telehealth: Payer: Self-pay | Admitting: Internal Medicine

## 2017-11-11 ENCOUNTER — Other Ambulatory Visit: Payer: Self-pay | Admitting: *Deleted

## 2017-11-11 MED ORDER — TRAMADOL HCL 50 MG PO TABS: ORAL_TABLET | ORAL | 3 refills | Status: DC

## 2017-11-11 NOTE — Telephone Encounter (Signed)
Copied from [place] [id number]. Topic: Quick Communication - See Telephone Encounter >> Nov 11, 2017  2:41 PM Boyd Kerbs wrote: CRM for notification. See Telephone encounter for:   Pt. Is calling for Tramadol and was told refill denied  and needs new prescription sent to   [facility] [address] [place] [address]) [place] [address] Phone: [phone] Fax: [phone]    11/11/17.

## 2017-11-11 NOTE — Telephone Encounter (Signed)
Please advise 

## 2017-11-11 NOTE — Telephone Encounter (Signed)
Patient requesting tramadol, last OV 11/04/17

## 2017-11-16 ENCOUNTER — Telehealth: Payer: Self-pay | Admitting: Internal Medicine

## 2017-11-16 DIAGNOSIS — M48062 Spinal stenosis, lumbar region with neurogenic claudication: Secondary | ICD-10-CM | POA: Diagnosis not present

## 2017-11-16 DIAGNOSIS — I25118 Atherosclerotic heart disease of native coronary artery with other forms of angina pectoris: Secondary | ICD-10-CM | POA: Diagnosis not present

## 2017-11-16 DIAGNOSIS — I208 Other forms of angina pectoris: Secondary | ICD-10-CM | POA: Diagnosis not present

## 2017-11-16 DIAGNOSIS — M5416 Radiculopathy, lumbar region: Secondary | ICD-10-CM | POA: Diagnosis not present

## 2017-11-16 DIAGNOSIS — M1611 Unilateral primary osteoarthritis, right hip: Secondary | ICD-10-CM | POA: Diagnosis not present

## 2017-11-16 DIAGNOSIS — I25768 Atherosclerosis of bypass graft of coronary artery of transplanted heart with other forms of angina pectoris: Secondary | ICD-10-CM | POA: Diagnosis not present

## 2017-11-16 DIAGNOSIS — R011 Cardiac murmur, unspecified: Secondary | ICD-10-CM | POA: Diagnosis not present

## 2017-11-16 DIAGNOSIS — R42 Dizziness and giddiness: Secondary | ICD-10-CM | POA: Diagnosis not present

## 2017-11-16 DIAGNOSIS — I10 Essential (primary) hypertension: Secondary | ICD-10-CM | POA: Diagnosis not present

## 2017-11-16 DIAGNOSIS — R002 Palpitations: Secondary | ICD-10-CM | POA: Diagnosis not present

## 2017-11-16 DIAGNOSIS — E782 Mixed hyperlipidemia: Secondary | ICD-10-CM | POA: Diagnosis not present

## 2017-11-16 DIAGNOSIS — K219 Gastro-esophageal reflux disease without esophagitis: Secondary | ICD-10-CM | POA: Diagnosis not present

## 2017-11-16 NOTE — Telephone Encounter (Signed)
Please advise 

## 2017-11-16 NOTE — Telephone Encounter (Signed)
Copied from [place]. Topic: Quick Communication - See Telephone Encounter >> Nov 16, 2017 10:07 AM Ivar Drape wrote: CRM for notification. See Telephone encounter for:  11/16/17. Gwinda Passe w/[facility] Ph: [phone] Fax: [phone] said the patient was referred for palpitations and she has an appt today at 2:45pm but they don't have any notes from the patient's 11/04/17(?).

## 2017-11-16 NOTE — Telephone Encounter (Signed)
Information has been faxed  

## 2017-11-23 ENCOUNTER — Ambulatory Visit: Payer: Medicare Other | Admitting: Internal Medicine

## 2017-11-25 NOTE — Telephone Encounter (Signed)
Patient called today stating that she has not received authorization from the insurance company for her Zetia and Nexium. Can you follow up to see if a letter has been faxed to the insurance carrier regarding the necessity for the patient having those medications?

## 2017-11-28 ENCOUNTER — Telehealth: Payer: Self-pay | Admitting: Internal Medicine

## 2017-11-28 NOTE — Telephone Encounter (Signed)
Copied from [place] [id number]. Topic: Quick Communication - See Telephone Encounter >> Nov 28, 2017  9:19 AM Burnis Medin, NT wrote: CRM for notification. See Telephone encounter for: Patient is calling to see if Lorriane Shire could give her a call back regarding her NEXIUM 40 MG capsule and ezetimibe (ZETIA) 10 MG tablet.  11/28/17.

## 2017-11-28 NOTE — Telephone Encounter (Signed)
Have started a PA for Zetia or nexium for this patient I think it is  Tier exception she needs. If not let me know I can help.

## 2017-11-29 NOTE — Telephone Encounter (Signed)
Spoke with [employer] and they stated that both the zetia and the nexium did not require a PA until April 2019. Called the pt and the pharmacy to let them know this. While on the phone with the pharmacy they stated that they would only be able to fill a partial on the zetia until they got a shipment in. Called the pt back to let her know this and she was okay with that and stated that she would go pick up the medications today.

## 2017-11-29 NOTE — Telephone Encounter (Signed)
Spoke with [employer] and they stated that the pt does not need a PA on either the zetia or the nexium until April 2019. Called the pt and explained this to her and informed her that I had called the pharmacy to let them know. When I called the pharmacy they stated that they would only be able to fill a partial on the zetia until they got another shipment in. Let the pt know that as well and she was okay with that. Pt stated that she was going to pick up the rxs today.

## 2017-12-01 DIAGNOSIS — I208 Other forms of angina pectoris: Secondary | ICD-10-CM | POA: Diagnosis not present

## 2017-12-01 DIAGNOSIS — R011 Cardiac murmur, unspecified: Secondary | ICD-10-CM | POA: Diagnosis not present

## 2017-12-12 DIAGNOSIS — I272 Pulmonary hypertension, unspecified: Secondary | ICD-10-CM

## 2017-12-12 HISTORY — DX: Pulmonary hypertension, unspecified: I27.20

## 2017-12-15 ENCOUNTER — Ambulatory Visit (INDEPENDENT_AMBULATORY_CARE_PROVIDER_SITE_OTHER): Payer: Medicare Other | Admitting: Vascular Surgery

## 2017-12-15 ENCOUNTER — Encounter (INDEPENDENT_AMBULATORY_CARE_PROVIDER_SITE_OTHER): Payer: Self-pay | Admitting: Vascular Surgery

## 2017-12-15 VITALS — BP 185/85 | HR 67 | Resp 16 | Ht 62.0 in | Wt 167.0 lb

## 2017-12-15 DIAGNOSIS — I714 Abdominal aortic aneurysm, without rupture, unspecified: Secondary | ICD-10-CM

## 2017-12-15 DIAGNOSIS — I2510 Atherosclerotic heart disease of native coronary artery without angina pectoris: Secondary | ICD-10-CM

## 2017-12-15 DIAGNOSIS — K551 Chronic vascular disorders of intestine: Secondary | ICD-10-CM | POA: Diagnosis not present

## 2017-12-15 DIAGNOSIS — I150 Renovascular hypertension: Secondary | ICD-10-CM | POA: Diagnosis not present

## 2017-12-15 DIAGNOSIS — I701 Atherosclerosis of renal artery: Secondary | ICD-10-CM | POA: Diagnosis not present

## 2017-12-15 DIAGNOSIS — I6523 Occlusion and stenosis of bilateral carotid arteries: Secondary | ICD-10-CM | POA: Diagnosis not present

## 2017-12-15 NOTE — Progress Notes (Signed)
MRN : [id number]  Sabrina [name] is a 77 y.o. (04/23/1941) female who presents with chief complaint of No chief complaint on file. 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  The patient denies abdominal pain or postprandial symptoms.  The patient denies weight loss as well as nausea.  The patient does not substantiate food fear, particular foods do not seem to aggravate or alleviate the symptoms.  The patient denies bloody bowel movements or diarrhea.  The patient has a history of colonoscopy which was not diagnostic.  No history of peptic ulcer disease.   No prior peripheral angiograms or vascular interventions.  The patient denies amaurosis fugax or recent TIA symptoms. There are no recent neurological changes noted. The patient denies claudication symptoms or rest pain symptoms. The patient denies history of DVT, PE or superficial thrombophlebitis. The patient denies recent episodes of angina   No outpatient medications have been marked as taking for the 12/15/17 encounter (Appointment) with Delana Meyer, [name], MD.    Past Medical History:  Diagnosis Date  . Anemia of chronic kidney failure   . Anxiety   . CAD (coronary artery disease)   . Carotid artery stenosis   . Chronic kidney disease, stage III (moderate) (HCC)    Followed by Dr. Juleen China  . Hyperlipidemia   . Hypertension   . Renal artery stenosis ([place])   . Secondary hyperparathyroidism ([place])   . 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 Subclavian arterial stenosis [facility])     Past Surgical History:  Procedure Laterality Date  . ABDOMINAL HYSTERECTOMY  1976  . CAROTID ARTERY ANGIOPLASTY Left   . CAROTID ENDARTERECTOMY Left   . CHOLECYSTECTOMY    . COLONOSCOPY WITH PROPOFOL N/A 08/16/2017   Procedure: COLONOSCOPY WITH PROPOFOL;  Surgeon: [name], MD;  Location:  [facility] ENDOSCOPY;  Service: Endoscopy;  Laterality: N/A;  . CORONARY ANGIOPLASTY WITH STENT PLACEMENT  2000  . CORONARY ARTERY BYPASS GRAFT  2000  . CYSTOSCOPY WITH STENT PLACEMENT Bilateral   . ESOPHAGOGASTRODUODENOSCOPY (EGD) WITH PROPOFOL N/A 08/16/2017   Procedure: ESOPHAGOGASTRODUODENOSCOPY (EGD) WITH PROPOFOL;  Surgeon: [name], MD;  Location: [facility] ENDOSCOPY;  Service: Endoscopy;  Laterality: N/A;  . RENAL ARTERY ANGIOPLASTY Bilateral mARCH 2015  . TOE AMPUTATION Right    small toe  . TONSILLECTOMY AND ADENOIDECTOMY    . TOTAL HIP ARTHROPLASTY Left   . TOTAL HIP ARTHROPLASTY Right 2015    Social History Social History   Tobacco Use  . Smoking status: Former Smoker    Last attempt to quit: 10/25/1998    Years since quitting: 19.1  . Smokeless tobacco: Never Used  Substance Use Topics  . Alcohol use: No    Alcohol/week: 0.0 oz  . Drug use: No    Family History Family History  Problem Relation Age of Onset  . Stroke Mother   . Hypertension Mother   . Diabetes Mother   . Hypertension Father   . Heart disease Sister        MI  . Multiple sclerosis Daughter   . Multiple sclerosis Son   . Cerebral aneurysm Son   . Seizures Son   . Cerebral aneurysm Son   . Breast cancer Paternal Aunt 3    Allergies  Allergen Reactions  . Citalopram     Throat closing   . Dilaudid [Hydromorphone Hcl] Nausea  And Vomiting  . Hydrochlorothiazide Other (See Comments)    Decreased GFR (Nov 2015)  . Nsaids     CKD stage III - avoid nephrotoxic drugs  . Nubain [Nalbuphine Hcl]     Burning sensation in back  . Penicillins Itching  . Prasugrel Itching  . Statins Itching     REVIEW OF SYSTEMS (Negative unless checked)  Constitutional: [] Weight loss  [] Fever  [] Chills Cardiac: [] Chest pain   [] Chest pressure   [] Palpitations   [] Shortness of breath when laying flat   [] Shortness of breath with exertion. Vascular:  [x] Pain in legs with walking   [x] Pain in legs at rest  [] History  of DVT   [] Phlebitis   [] Swelling in legs   [] Varicose veins   [] Non-healing ulcers Pulmonary:   [] Uses home oxygen   [] Productive cough   [] Hemoptysis   [] Wheeze  [] COPD   [] Asthma Neurologic:  [] Dizziness   [] Seizures   [] History of stroke   [] History of TIA  [] Aphasia   [] Vissual changes   [] Weakness or numbness in arm   [] Weakness or numbness in leg Musculoskeletal:   [] Joint swelling   [x] Joint pain   [x] Low back pain Hematologic:  [] Easy bruising  [] Easy bleeding   [] Hypercoagulable state   [] Anemic Gastrointestinal:  [] Diarrhea   [] Vomiting  [] Gastroesophageal reflux/heartburn   [] Difficulty swallowing. Genitourinary:  [] Chronic kidney disease   [] Difficult urination  [] Frequent urination   [] Blood in urine Skin:  [] Rashes   [] Ulcers  Psychological:  [] History of anxiety   []  History of major depression.  Physical Examination  There were no vitals filed for this visit. There is no height or weight on file to calculate BMI. Gen: WD/WN, NAD Head: [place]/AT, No temporalis wasting.  Ear/Nose/Throat: Hearing grossly intact, nares w/o erythema or drainage Eyes: PER, EOMI, sclera nonicteric.  Neck: Supple, no large masses.   Pulmonary:  Good air movement, no audible wheezing bilaterally, no use of accessory muscles.  Cardiac: RRR, no JVD Vascular: bilateral carotid bruit Vessel Right Left  Radial Palpable Palpable  PT Not Palpable Not Palpable  DP Not Palpable Not Palpable  Gastrointestinal: Non-distended. No guarding/no peritoneal signs.  Musculoskeletal: M/S 5/5 throughout.  No deformity or atrophy.  Neurologic: CN 2-12 intact. Symmetrical.  Speech is fluent. Motor exam as listed above. Psychiatric: Judgment intact, Mood & affect appropriate for pt's clinical situation. Dermatologic: No rashes or ulcers noted.  No changes consistent with cellulitis. Lymph : No lichenification or skin changes of chronic lymphedema.  CBC Lab Results  Component Value Date   WBC 5.4 07/06/2017   HGB  11.2 (L) 07/06/2017   HCT 33.6 (L) 07/06/2017   MCV 85.5 07/06/2017   PLT 264.0 07/06/2017    BMET    Component Value Date/Time   NA 137 07/06/2017 1440   NA 139 09/13/2014   NA 134 (L) 04/12/2014 0558   K 4.1 07/06/2017 1440   K 4.4 04/12/2014 0558   CL 101 07/06/2017 1440   CL 101 04/12/2014 0558   CO2 27 07/06/2017 1440   CO2 27 04/12/2014 0558   GLUCOSE 91 07/06/2017 1440   GLUCOSE 90 04/12/2014 0558   BUN 18 07/06/2017 1440   BUN 18 09/13/2014   BUN 16 04/12/2014 0558   CREATININE 1.20 07/06/2017 1440   CREATININE 1.12 04/12/2014 0558   CALCIUM 9.6 07/06/2017 1440   CALCIUM 8.5 04/12/2014 0558   GFRNONAA 44 (L) 02/07/2017 1627   GFRNONAA 49 (L) 04/12/2014 0558   GFRAA 51 (L) 02/07/2017  [place] (L) 04/12/2014 0558   CrCl cannot be calculated (Patient's most recent lab result is older than the maximum 21 days allowed.).  COAG Lab Results  Component Value Date   INR 1.0 03/27/2014    Radiology No results found.  Assessment/Plan 1. 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HCC) Recommend:  The patient has evidence of chronic asymptomatic mesenteric atherosclerosis.  The patient denies lifestyle limiting changes at this point in time.  Given the lack of symptoms no intervention is warranted at this time.   No further invasive studies, angiography or surgery at this time  The patient should continue walking and begin a more formal exercise program.   The patient should continue antiplatelet therapy and aggressive treatment of the lipid abnormalities  Patient should undergo noninvasive studies as ordered. The patient will follow up with me after the studies.    2. Bilateral carotid artery stenosis Recommend:  Given the patient's asymptomatic subcritical stenosis no further invasive testing or surgery at this time.  Duplex ultrasound shows RICA occlusion and <97% LICA.  Continue antiplatelet therapy as prescribed Continue management of CAD, HTN and  Hyperlipidemia Healthy heart diet,  encouraged exercise at least 4 times per week Follow up in 6 months with duplex ultrasound and physical exam   - VAS US CAROTID; Future  3. Abdominal aortic aneurysm (AAA) without rupture ([place]) No surgery or intervention at this time. The patient has an asymptomatic abdominal aortic aneurysm that is less than 4 cm in maximal diameter.  I have discussed the natural history of abdominal aortic aneurysm and the small risk of rupture for aneurysm less than 5 cm in size.  However, as these small aneurysms tend to enlarge over time, continued surveillance with ultrasound or CT scan is mandatory.  I have also discussed optimizing medical management with hypertension and lipid control and the importance of abstinence from tobacco.  The patient is also encouraged to exercise a minimum of 30 minutes 4 times a week.  Should the patient develop new onset abdominal or back pain or signs of peripheral embolization they are instructed to seek medical attention immediately and to alert the physician providing care that they have an aneurysm.  The patient voices their understanding. The patient will return in 12 months with an aortic duplex.  - VAS US AORTA/IVC/ILIACS; Future - VAS [place] ABI WITH/WO TBI; Future  4. Atherosclerosis of renal artery (HCC) Recommend:  The patient has evidence of atherosclerotic changes of the renal artery.  At this time the patient's blood pressure is fairly well controlled.  Patient does not need angiography of the renal artery given the good control of his hypertension.    However, if at any point the patient's BP becomes acutely worse then intervention would be strongly encouraged.  The patient voices understanding of this plan and agrees.     5. Coronary artery disease involving native coronary artery of native heart without angina pectoris Continue cardiac and antihypertensive medications as already ordered and reviewed, no changes at  this time.  Continue statin as ordered and reviewed, no changes at this time  Nitrates PRN for chest pain   6. Renovascular hypertension Continue antihypertensive medications as already ordered, these medications have been reviewed and there are no changes at this time.    Hortencia Pilar, MD  12/15/2017 1:08 PM

## 2017-12-16 ENCOUNTER — Telehealth: Payer: Self-pay

## 2017-12-16 NOTE — Telephone Encounter (Signed)
refaxing letter to [facility] for pt. Copy of letter also given to pt.

## 2017-12-23 NOTE — Telephone Encounter (Signed)
PA for Nexium has been approved through 12/17/2017 - 10/24/2018. Pt and pharmacy are aware.

## 2017-12-23 NOTE — Telephone Encounter (Signed)
PA for Zetia has been approved through 10/24/2018. Pt and pharmacy are aware.

## 2017-12-29 ENCOUNTER — Other Ambulatory Visit: Payer: Self-pay | Admitting: Internal Medicine

## 2017-12-29 DIAGNOSIS — Z1231 Encounter for screening mammogram for malignant neoplasm of breast: Secondary | ICD-10-CM

## 2018-01-05 ENCOUNTER — Telehealth: Payer: Self-pay | Admitting: Internal Medicine

## 2018-01-05 ENCOUNTER — Other Ambulatory Visit: Payer: Self-pay | Admitting: Internal Medicine

## 2018-01-05 DIAGNOSIS — I10 Essential (primary) hypertension: Secondary | ICD-10-CM

## 2018-01-05 NOTE — Telephone Encounter (Signed)
Copied from [place] [phone] [phone]. Topic: Quick Communication - Rx Refill/Question >> Jan 05, 2018  3:29 PM Carolyn Stare wrote: Medication    amLODipine (NORVASC) 10 MG tablet   Has the patient contacted their pharmacy   yes     Preferred [facility]     Agent: Please be advised that RX refills may take up to 3 business days. We ask that you follow-up with your pharmacy.

## 2018-01-06 MED ORDER — AMLODIPINE BESYLATE 10 MG PO TABS: 10.0000 mg | ORAL_TABLET | Freq: Every day | ORAL | 0 refills | Status: DC

## 2018-01-30 DIAGNOSIS — I701 Atherosclerosis of renal artery: Secondary | ICD-10-CM | POA: Diagnosis not present

## 2018-01-30 DIAGNOSIS — R319 Hematuria, unspecified: Secondary | ICD-10-CM | POA: Diagnosis not present

## 2018-01-30 DIAGNOSIS — N183 Chronic kidney disease, stage 3 (moderate): Secondary | ICD-10-CM | POA: Diagnosis not present

## 2018-01-30 DIAGNOSIS — R809 Proteinuria, unspecified: Secondary | ICD-10-CM | POA: Diagnosis not present

## 2018-01-30 DIAGNOSIS — I129 Hypertensive chronic kidney disease with stage 1 through stage 4 chronic kidney disease, or unspecified chronic kidney disease: Secondary | ICD-10-CM | POA: Diagnosis not present

## 2018-02-01 DIAGNOSIS — N183 Chronic kidney disease, stage 3 (moderate): Secondary | ICD-10-CM | POA: Diagnosis not present

## 2018-02-01 DIAGNOSIS — R809 Proteinuria, unspecified: Secondary | ICD-10-CM | POA: Diagnosis not present

## 2018-02-01 DIAGNOSIS — I701 Atherosclerosis of renal artery: Secondary | ICD-10-CM | POA: Diagnosis not present

## 2018-02-01 DIAGNOSIS — R319 Hematuria, unspecified: Secondary | ICD-10-CM | POA: Diagnosis not present

## 2018-02-01 DIAGNOSIS — I129 Hypertensive chronic kidney disease with stage 1 through stage 4 chronic kidney disease, or unspecified chronic kidney disease: Secondary | ICD-10-CM | POA: Diagnosis not present

## 2018-02-02 ENCOUNTER — Other Ambulatory Visit: Payer: Self-pay | Admitting: Internal Medicine

## 2018-02-03 NOTE — Telephone Encounter (Signed)
Copied from [place] [phone]. Topic: Quick Communication - Rx Refill/Question >> Feb 03, 2018  9:38 AM Synthia Innocent wrote: Medication:amLODipine (NORVASC) 10 MG tablet and lisinopril (PRINIVIL,ZESTRIL) 20 MG tablet  Has the patient contacted their pharmacy? Yes.   (Agent: If no, request that the patient contact the pharmacy for the refill.) Preferred Pharmacy (with phone number or street name): [place] Agent: Please be advised that RX refills may take up to 3 business days. We ask that you follow-up with your pharmacy.

## 2018-02-03 NOTE — Telephone Encounter (Signed)
LOV  11/04/17 Dr. Derrel Nip Provider's name not on medication.

## 2018-02-16 ENCOUNTER — Telehealth: Payer: Self-pay | Admitting: Internal Medicine

## 2018-02-16 ENCOUNTER — Ambulatory Visit: Payer: Self-pay

## 2018-02-16 ENCOUNTER — Encounter: Payer: Self-pay | Admitting: *Deleted

## 2018-02-16 NOTE — Telephone Encounter (Signed)
Patient called with c/o "headache." She says "it hurts all over in different places for about 2 weeks. It's not hurting right now, but was a little bit when I woke up. This is a different headache than what I've had in the past, what I get about every 6 months. I took Tramadol and that didn't touch the headache and I wear a Fentanyl patch for headaches, which is a waste of my money. I have been dizzy every time I get up." I asked what is her BP today, she says " I checked and it was 118/77. On 4/18 it was 84/56, then 1 hour later 95/58 P 83. On 4/21 it was 113/66, 104/68, 121/68." I asked about other symptoms, she says "I have a stuffy nose all the time and my eyes hurt with the headache." According to protocol, see PCP within 3 days, no availability with PCP, she says "I only want to see Dr. Derrel Nip because she knows all about me." I asked the patient to hold while I call the office to see about a work in, she agreed. I called and spoke to Buddy Duty, [facility] who says there is no available slots to schedule her in. I spoke back to the patient and advised no available slots, she says "let me speak to Juliann Pulse or Lorriane Shire in the office." I asked her to hold again while I call the office, she agreed. I called the office and notified Patina that the patient wanted to talk to Lorriane Shire or Juliann Pulse, she put me on hold to see if they were available. Patina came back on the line to tell me there was a cancellation and there is an opening for Monday, 02/20/18 at 0800, which she scheduled for the patient. Before I could complete the call with Patina, the patient hung up the phone. After the scheduling was completed, I called the patient back who says "I was holding entirely too long and I thought you had hung up the phone on me." I advised that the scheduling was being done by the [facility] at the office and apologized for the long hold. I advised the patient of her appointment on Monday, 02/20/18 at 0800, patient verbalized understanding and hung  up phone before care advice could be given.  Reason for Disposition . [1] MILD-MODERATE headache AND [2] present > 72 hours  Answer Assessment - Initial Assessment Questions 1. LOCATION: "Where does it hurt?"      Eyes, back of head, temples, all over 2. ONSET: "When did the headache start?" (Minutes, hours or days)      2 weeks 3. PATTERN: "Does the pain come and go, or has it been constant since it started?"     Constant 4. SEVERITY: "How bad is the pain?" and "What does it keep you from doing?"  (e.g., Scale 1-10; mild, moderate, or severe)   - MILD (1-3): doesn't interfere with normal activities    - MODERATE (4-7): interferes with normal activities or awakens from sleep    - SEVERE (8-10): excruciating pain, unable to do any normal activities        No pain right now 5. RECURRENT SYMPTOM: "Have you ever had headaches before?" If so, ask: "When was the last time?" and "What happened that time?"      Yes; this is different 6. CAUSE: "What do you think is causing the headache?"     No 7. MIGRAINE: "Have you been diagnosed with migraine headaches?" If so, ask: "Is this headache similar?"  No 8. HEAD INJURY: "Has there been any recent injury to the head?"     No 9. OTHER SYMPTOMS: "Do you have any other symptoms?" (fever, stiff neck, eye pain, sore throat, cold symptoms)     Eyes hurt when have headache; stuffy nose-use afrin 10. PREGNANCY: "Is there any chance you are pregnant?" "When was your last menstrual period?"       No  Protocols used: HEADACHE-A-AH

## 2018-02-16 NOTE — Telephone Encounter (Signed)
Sabrina [name] has already spoken with this pt and the pt stated to her that she will be fine until her dappt on Monday with Dr. Derrel Nip.

## 2018-02-16 NOTE — Telephone Encounter (Signed)
Pt came into office this afternoon to make sure telephone message went through. She states she has one patch left and is good until she sees Dr. Derrel Nip on  Monday 02/20/2018.

## 2018-02-16 NOTE — Telephone Encounter (Signed)
Request for refill of Fentanyl patches  Last refilled on 01/02/18 #10patches  LOV: 11/04/17 Next OV:  02/20/18 Dr. Hortencia Conradi on [facility]

## 2018-02-16 NOTE — Telephone Encounter (Signed)
Please advise 

## 2018-02-16 NOTE — Telephone Encounter (Unsigned)
Copied from [place] [id number]. Topic: Quick Communication - Rx Refill/Question >> Feb 16, 2018 11:27 AM Percell Belt A wrote: Medication: fentaNYL ([place]) 75 MCG/HR [[id number] Has the patient contacted their pharmacy? no (Agent: If no, request that the patient contact the pharmacy for the refill.) Preferred Pharmacy (with phone number or street name): [facility] - on [address]: Please be advised that RX refills may take up to 3 business days. We ask that you follow-up with your pharmacy.

## 2018-02-20 ENCOUNTER — Ambulatory Visit (INDEPENDENT_AMBULATORY_CARE_PROVIDER_SITE_OTHER): Payer: Medicare Other | Admitting: Internal Medicine

## 2018-02-20 ENCOUNTER — Encounter: Payer: Self-pay | Admitting: Internal Medicine

## 2018-02-20 VITALS — BP 122/70 | HR 67 | Temp 97.7°F | Resp 14 | Ht 62.0 in | Wt 153.6 lb

## 2018-02-20 DIAGNOSIS — R519 Headache, unspecified: Secondary | ICD-10-CM

## 2018-02-20 DIAGNOSIS — I10 Essential (primary) hypertension: Secondary | ICD-10-CM

## 2018-02-20 DIAGNOSIS — R51 Headache: Secondary | ICD-10-CM | POA: Diagnosis not present

## 2018-02-20 DIAGNOSIS — I2510 Atherosclerotic heart disease of native coronary artery without angina pectoris: Secondary | ICD-10-CM

## 2018-02-20 DIAGNOSIS — I701 Atherosclerosis of renal artery: Secondary | ICD-10-CM

## 2018-02-20 DIAGNOSIS — M47812 Spondylosis without myelopathy or radiculopathy, cervical region: Secondary | ICD-10-CM

## 2018-02-20 DIAGNOSIS — I714 Abdominal aortic aneurysm, without rupture, unspecified: Secondary | ICD-10-CM

## 2018-02-20 DIAGNOSIS — K551 Chronic vascular disorders of intestine: Secondary | ICD-10-CM | POA: Diagnosis not present

## 2018-02-20 DIAGNOSIS — K219 Gastro-esophageal reflux disease without esophagitis: Secondary | ICD-10-CM

## 2018-02-20 DIAGNOSIS — M5416 Radiculopathy, lumbar region: Secondary | ICD-10-CM

## 2018-02-20 DIAGNOSIS — F458 Other somatoform disorders: Secondary | ICD-10-CM

## 2018-02-20 MED ORDER — FENTANYL 75 MCG/HR TD PT72: 75.0000 ug | MEDICATED_PATCH | TRANSDERMAL | 0 refills | Status: DC

## 2018-02-20 MED ORDER — NEXIUM 40 MG PO CPDR: 40.0000 mg | DELAYED_RELEASE_CAPSULE | Freq: Every day | ORAL | 3 refills | Status: DC

## 2018-02-20 NOTE — Progress Notes (Signed)
Subjective:  Patient ID: Sabrina [name], female    DOB: 1941/07/01  Age: 77 y.o. MRN: [id number]  CC: The primary encounter diagnosis was Coronary artery disease involving native coronary artery of native heart without angina pectoris. Diagnoses of Acute intractable headache, unspecified headache type, Essential hypertension, Gastroesophageal reflux disease without esophagitis, Lumbar radiculitis, Abdominal aortic aneurysm (AAA) without rupture (HCC), Cervical spondylosis without myelopathy, 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[place]), and Dizziness, psychogenic were also pertinent to this visit.  HPI Sabrina [name] presents for follow up on hypertension and evaluation of multiple chronic and acute  complaints  1) headache:  alternates between  Right side and left side ,  Top of head  And occipital. Sometimes wakes up with it.  stabbing in character,  Not constant,   Will last  for several  hours,  In spite of use of tramadol and fentanyl patch.  Has been recurring daily  For the past  3 weeks .  At times is severe behind the right eye and radiates to temple,  Other times it does not.  No vision changes,  No fevers,  Neck stiffness.  No numbness.Fingers are numb every morning  On the left hand .  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    2) chronic pain:  Multiple chronic pain completes including left breast, bilateral legs,  Hips  And feet.  Secondary to bilateral DJD hips,  Cervical spondylosis without myelopathy,  lumbar spinal stenosis  With radiculitis.  Managed with tramadol daily and low dose fentanyl  transdermal  patch . Marland Kitchen  Refill history confirmed via [place] Controlled Substance database.   Has pain in rectal area with change in position from sitting to standing .   3) stomach pain for the past 3 weeks  Epigastric. Has been out of nexium due to financial difficulties.  There has been  No change in cost  She does have chronic Constipation managed with only chia  seed  On a daily basis  . Most weeks her  bowels once a week .    4) Hypertension :Patient is taking her medications as prescribed and notes no adverse effects.  Home BP readings have been done daily < 130/80 .  Uses hydralazine prn sbp > 150   Only 4 times in the last month,  has has had 3 low readings where her systolic was < 90.   She is avoiding added salt in her diet and does not walk regularly for exercise  .  Outpatient Medications Prior to Visit  Medication Sig Dispense Refill  . amLODipine (NORVASC) 10 MG tablet Take 1 tablet (10 mg total) by mouth daily. 90 tablet 0  . aspirin 81 MG tablet Take 81 mg by mouth daily.    . baclofen (LIORESAL) 10 MG tablet Take 2 tablets at bedtime and during the day as needed for muscle spasm 120 tablet 5  . chlordiazePOXIDE (LIBRIUM) 25 MG capsule TAKE ONE CAPSULE BY MOUTH ONCE DAILY AS NEEDED FOR ANXIETY 30 capsule 5  . clopidogrel (PLAVIX) 75 MG tablet Take 1 tablet (75 mg total) daily by mouth. 90 tablet 2  . dicyclomine (BENTYL) 10 MG/5ML syrup Take 5 mLs (10 mg total) by mouth 4 (four) times daily -  before meals and at bedtime. 473 mL 12  . hydrALAZINE (APRESOLINE) 50 MG tablet TAKE ONE TABLET BY MOUTH THREE TIMES DAILY AS NEEDED 270 tablet 0  . labetalol (NORMODYNE) 200 MG tablet TAKE ONE-HALF TABLET  BY MOUTH THREE TIMES DAILY 135 tablet 1  . lisinopril (PRINIVIL,ZESTRIL) 20 MG tablet TAKE 1 TABLET BY MOUTH TWICE DAILY 180 tablet 0  . magnesium oxide (MAG-OX) 400 (241.3 Mg) MG tablet Take 1 tablet (400 mg total) by mouth daily. At bedtime 30 tablet 0  . Omega-3 Fatty Acids (FISH OIL) 1000 MG CAPS Take 1 capsule by mouth daily.    . predniSONE (DELTASONE) 10 MG tablet Take 6 tablets x 1 day then decrease by 1 tablet per day until down to zero mg. 21 tablet 0  . traMADol (ULTRAM) 50 MG tablet TAKE TWO TABLETS BY MOUTH EVERY 8 HOURS AS NEEDED 180 tablet 3  . fentaNYL (DURAGESIC) 75 MCG/HR Place 1 patch (75 mcg total) onto the skin every 3 (three) days. May refill on or after  January 02 2018 10 patch 0  . lisinopril (PRINIVIL,ZESTRIL) 40 MG tablet     . ezetimibe (ZETIA) 10 MG tablet Take 1 tablet (10 mg total) by mouth daily. (Patient not taking: Reported on 02/20/2018) 30 tablet 5  . NEXIUM 40 MG capsule Take 1 capsule (40 mg total) by mouth daily. (Patient not taking: Reported on 02/20/2018) 30 capsule 11   No facility-administered medications prior to visit.     Review of Systems;  Patient denies , fevers, malaise, unintentional weight loss, skin rash, eye pain, sinus congestion and sinus pain, sore throat, dysphagia,  hemoptysis , cough, dyspnea, wheezing, chest pain, palpitations, orthopnea, edema,  nausea, melena, diarrhea,flank pain, dysuria, hematuria, urinary  Frequency, nocturia,  seizures,  Focal weakness, Loss of consciousness,  Tremor, insomnia, depression, anxiety, and suicidal ideation.      Objective:  BP 122/70 (BP Location: Left Arm, Patient Position: Sitting, Cuff Size: Normal)   Pulse 67   Temp 97.7 F (36.5 C) (Oral)   Resp 14   Ht '5\' 2"'$  (1.575 m)   Wt 153 lb 9.6 oz (69.7 kg)   SpO2 99%   BMI 28.09 kg/m   BP Readings from Last 3 Encounters:  02/20/18 122/70  12/15/17 (!) 185/85  11/04/17 (!) 154/80    Wt Readings from Last 3 Encounters:  02/20/18 153 lb 9.6 oz (69.7 kg)  12/15/17 167 lb (75.8 kg)  11/04/17 160 lb 6.4 oz (72.8 kg)    General appearance: alert, cooperative and appears stated age Ears: normal TM's and external ear canals both ears Throat: lips, mucosa, and tongue normal; teeth and gums normal Neck: no adenopathy, no carotid bruit, supple, symmetrical, trachea midline and thyroid not enlarged, symmetric, no tenderness/mass/nodules Back: symmetric, no curvature. ROM normal. No CVA tenderness. Lungs: clear to auscultation bilaterally Heart: regular rate and rhythm, S1, S2 normal, no murmur, click, rub or gallop Abdomen: soft, non-tender; bowel sounds normal; no masses,  no organomegaly Pulses: 2+ and  symmetric Skin: Skin color, texture, turgor normal. No rashes or lesions Lymph nodes: Cervical, supraclavicular, and axillary nodes normal. Neuro: CNs 2-12 intact. DTRs 2+/4 in biceps, brachioradialis, patellars and achilles. Muscle strength 5/5 in upper and lower exremities. Fine resting tremor bilaterally both hands cerebellar function normal. Romberg negative.  No pronator drift.   Gait normal.   Lab Results  Component Value Date   HGBA1C 5.9 07/19/2016   HGBA1C 5.8 06/02/2015    Lab Results  Component Value Date   CREATININE 1.20 07/06/2017   CREATININE 1.18 (H) 02/07/2017   CREATININE 1.15 07/19/2016    Lab Results  Component Value Date   WBC 5.4 07/06/2017   HGB 11.2 (L) 07/06/2017  HCT 33.6 (L) 07/06/2017   PLT 264.0 07/06/2017   GLUCOSE 91 07/06/2017   CHOL 167 07/19/2016   TRIG 55.0 07/19/2016   HDL 57.90 07/19/2016   LDLDIRECT 109.0 03/05/2016   LDLCALC 98 07/19/2016   ALT 9 07/06/2017   AST 16 07/06/2017   NA 137 07/06/2017   K 4.1 07/06/2017   CL 101 07/06/2017   CREATININE 1.20 07/06/2017   BUN 18 07/06/2017   CO2 27 07/06/2017   TSH 4.440 09/02/2016   INR 1.0 03/27/2014   HGBA1C 5.9 07/19/2016    No results found.  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  Continue fentanyl transdermal and oral tramadol  for pain management as she is a poor candidate for surgery and refuses pain management/ESI.  3 months of refills given today on fentanyl      HTN (hypertension)    Well controlled on current regimen.  no changes today. sHe will return for [facility]   Lab Results  Component Value Date   CREATININE 1.20 07/06/2017   Lab Results  Component Value Date   NA 137 07/06/2017   K 4.1 07/06/2017   CL 101 07/06/2017   CO2 27  07/06/2017         Relevant Orders   Comp Met (CMET)   Headache    No particular pattern,  And she has cervical spondylosis .  Ruling out potential diagosis of vasculitis with CRP, ESR and CBC       Relevant Medications   fentaNYL (DURAGESIC) 75 MCG/HR   Other Relevant Orders   CBC w/Diff   Sedimentation rate   C-reactive protein   GERD (gastroesophageal reflux disease)    Managed with Nexium.  Recent lapse has resulted in gastritis.  Encouraged to  resume nexium       Relevant Medications   NEXIUM 40 MG capsule   Dizziness, psychogenic    chronic , without vertigo.  No progression  .  May be due to PAD.  She is not orthostatic or having vertigo.  She has PAD and DJD cervical spine . Suspected to be a  medication side effect , but did not change with suspension of opioids, and she was miserable without them due to inability to take NSAIDs.       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HCC)   Cervical spondylosis without myelopathy    Secondary to cervical spinal stenosis of varying degrees from c4 to C7 ; she has deferred  referral to pain management for ESI trial. Continue pain management with fentanyl and tramadol.   Refills for 3 months given starting February 20, 2018.  She has attempted to wean in the past without success.  NSAIDs are C/I secondary to hypertension,  Age,  And CKD stage 3      Relevant Medications   fentaNYL (DURAGESIC) 75 MCG/HR   Aortic aneurysm, abdominal ([place])    Noted on 2011 CT (upon comprehensive review of chart following detection of bruit on prior exam.  Has been referred to  vascular surgery for periodic surveillance with aortic ultrasound . BP is managed with daily meds and prn hydralazine.        A total of 25 minutes of face to face time was spent with  patient more than half of which was spent in counselling about the above mentioned conditions  and coordination of care   I am having Mayari A. Blanck maintain her aspirin, Fish Oil, dicyclomine,  clopidogrel, ezetimibe, hydrALAZINE, chlordiazePOXIDE, magnesium oxide, predniSONE, baclofen, traMADol, labetalol, amLODipine, lisinopril, NEXIUM, and fentaNYL.  Meds ordered this encounter  Medications  . DISCONTD: fentaNYL (DURAGESIC) 75 MCG/HR    Sig: Place 1 patch (75 mcg total) onto the skin every 3 (three) days.    Dispense:  10 patch    Refill:  0  . NEXIUM 40 MG capsule    Sig: Take 1 capsule (40 mg total) by mouth daily.    Dispense:  90 capsule    Refill:  3    Please consider 90 day supplies to promote better adherence  . DISCONTD: fentaNYL (DURAGESIC) 75 MCG/HR    Sig: Place 1 patch (75 mcg total) onto the skin every 3 (three) days.    Dispense:  10 patch    Refill:  0    May refill on or after June 282019  . fentaNYL (DURAGESIC) 75 MCG/HR    Sig: Place 1 patch (75 mcg total) onto the skin every 3 (three) days.    Dispense:  10 patch    Refill:  0    May refill on or after Mar 22 2018    Medications Discontinued During This Encounter  Medication Reason  . fentaNYL (DURAGESIC) 75 MCG/HR Reorder  . NEXIUM 40 MG capsule Reorder  . lisinopril (PRINIVIL,ZESTRIL) 40 MG tablet   . fentaNYL (DURAGESIC) 75 MCG/HR Reorder  . fentaNYL (DURAGESIC) 75 MCG/HR Reorder    Follow-up: Return in about 3 months (around 05/22/2018) for medication refill .   Crecencio Mc, MD

## 2018-02-20 NOTE — Patient Instructions (Addendum)
Dulcolax    5 mg use it every 3 days to keep bowels moving  Ok to  Use chia seed daily  Or Miralax daily (samples provided)  Your headaches may be coming fromn your cervical spine arthritis and degenerative changes.  I am ruling out another cause with the bloodwork today

## 2018-02-21 NOTE — Assessment & Plan Note (Signed)
chronic , without vertigo.  No progression  .  May be due to PAD.  She is not orthostatic or having vertigo.  She has PAD and DJD cervical spine . Suspected to be a  medication side effect , but did not change with suspension of opioids, and she was miserable without them due to inability to take NSAIDs.

## 2018-02-21 NOTE — Assessment & Plan Note (Addendum)
Secondary to cervical spinal stenosis of varying degrees from c4 to C7 ; she has deferred  referral to pain management for ESI trial. Continue pain management with fentanyl and tramadol.   Refills for 3 months given starting February 20, 2018.  She has attempted to wean in the past without success.  NSAIDs are C/I secondary to hypertension,  Age,  And CKD stage 3

## 2018-02-21 NOTE — Assessment & Plan Note (Signed)
Well controlled on current regimen.  no changes today. sHe will return for [facility]   Lab Results  Component Value Date   CREATININE 1.20 07/06/2017   Lab Results  Component Value Date   NA 137 07/06/2017   K 4.1 07/06/2017   CL 101 07/06/2017   CO2 27 07/06/2017

## 2018-02-21 NOTE — Assessment & Plan Note (Signed)
Noted on 2011 CT (upon comprehensive review of chart following detection of bruit on prior exam.  Has been referred to  vascular surgery for periodic surveillance with aortic ultrasound . BP is managed with daily meds and prn hydralazine.

## 2018-02-21 NOTE — Assessment & Plan Note (Signed)
Managed with Nexium.  Recent lapse has resulted in gastritis.  Encouraged to  resume nexium

## 2018-02-21 NOTE — Assessment & Plan Note (Signed)
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  Continue fentanyl transdermal and oral tramadol  for pain management as she is a poor candidate for surgery and refuses pain management/ESI.  3 months of refills given today on fentanyl

## 2018-02-21 NOTE — Assessment & Plan Note (Signed)
No particular pattern,  And she has cervical spondylosis .  Ruling out potential diagosis of vasculitis with CRP, ESR and CBC

## 2018-02-22 ENCOUNTER — Other Ambulatory Visit (INDEPENDENT_AMBULATORY_CARE_PROVIDER_SITE_OTHER): Payer: Medicare Other

## 2018-02-22 ENCOUNTER — Other Ambulatory Visit: Payer: Self-pay

## 2018-02-22 ENCOUNTER — Other Ambulatory Visit: Payer: Self-pay | Admitting: Internal Medicine

## 2018-02-22 ENCOUNTER — Encounter: Payer: Self-pay | Admitting: Internal Medicine

## 2018-02-22 DIAGNOSIS — D649 Anemia, unspecified: Secondary | ICD-10-CM

## 2018-02-22 DIAGNOSIS — R519 Headache, unspecified: Secondary | ICD-10-CM

## 2018-02-22 DIAGNOSIS — N183 Chronic kidney disease, stage 3 unspecified: Secondary | ICD-10-CM | POA: Insufficient documentation

## 2018-02-22 DIAGNOSIS — I10 Essential (primary) hypertension: Secondary | ICD-10-CM

## 2018-02-22 DIAGNOSIS — I2510 Atherosclerotic heart disease of native coronary artery without angina pectoris: Secondary | ICD-10-CM | POA: Diagnosis not present

## 2018-02-22 DIAGNOSIS — R51 Headache: Secondary | ICD-10-CM | POA: Diagnosis not present

## 2018-02-22 DIAGNOSIS — D631 Anemia in chronic kidney disease: Secondary | ICD-10-CM | POA: Insufficient documentation

## 2018-02-22 LAB — LIPID PANEL
Cholesterol: 167 mg/dL (ref 0–200)
HDL: 54.8 mg/dL (ref >39.00)
LDL Cholesterol: 102 mg/dL — ABNORMAL HIGH (ref 0–99)
NonHDL: 112.29
Total CHOL/HDL Ratio: 3
Triglycerides: 53 mg/dL (ref 0.0–149.0)
VLDL: 10.6 mg/dL (ref 0.0–40.0)

## 2018-02-22 LAB — COMPREHENSIVE METABOLIC PANEL
ALT: 11 U/L (ref 0–35)
AST: 18 U/L (ref 0–37)
Albumin: 3.9 g/dL (ref 3.5–5.2)
Alkaline Phosphatase: 61 U/L (ref 39–117)
BUN: 17 mg/dL (ref 6–23)
CO2: 28 mEq/L (ref 19–32)
Calcium: 9.3 mg/dL (ref 8.4–10.5)
Chloride: 102 mEq/L (ref 96–112)
Creatinine, Ser: 1.1 mg/dL (ref 0.40–1.20)
GFR: 62.1 mL/min (ref >60.00)
Glucose, Bld: 87 mg/dL (ref 70–99)
Potassium: 4.6 mEq/L (ref 3.5–5.1)
Sodium: 136 mEq/L (ref 135–145)
Total Bilirubin: 0.6 mg/dL (ref 0.2–1.2)
Total Protein: 6.8 g/dL (ref 6.0–8.3)

## 2018-02-22 LAB — CBC WITH DIFFERENTIAL/PLATELET
Basophils Absolute: 0 10*3/uL (ref 0.0–0.1)
Basophils Relative: 0.7 % (ref 0.0–3.0)
Eosinophils Absolute: 0.8 10*3/uL — ABNORMAL HIGH (ref 0.0–0.7)
Eosinophils Relative: 13.6 % — ABNORMAL HIGH (ref 0.0–5.0)
HCT: 31.7 % — ABNORMAL LOW (ref 36.0–46.0)
Hemoglobin: 10.8 g/dL — ABNORMAL LOW (ref 12.0–15.0)
Lymphocytes Relative: 38 % (ref 12.0–46.0)
Lymphs Abs: 2.3 10*3/uL (ref 0.7–4.0)
MCHC: 34.2 g/dL (ref 30.0–36.0)
MCV: 84.9 fl (ref 78.0–100.0)
Monocytes Absolute: 0.6 10*3/uL (ref 0.1–1.0)
Monocytes Relative: 10.2 % (ref 3.0–12.0)
Neutro Abs: 2.3 10*3/uL (ref 1.4–7.7)
Neutrophils Relative %: 37.5 % — ABNORMAL LOW (ref 43.0–77.0)
Platelets: 243 10*3/uL (ref 150.0–400.0)
RBC: 3.74 Mil/uL — ABNORMAL LOW (ref 3.87–5.11)
RDW: 14 % (ref 11.5–15.5)
WBC: 6 10*3/uL (ref 4.0–10.5)

## 2018-02-22 LAB — SEDIMENTATION RATE: Sed Rate: 24 mm/hr (ref 0–30)

## 2018-02-22 LAB — C-REACTIVE PROTEIN: CRP: 0.2 mg/dL — ABNORMAL LOW (ref 0.5–20.0)

## 2018-02-22 NOTE — Progress Notes (Signed)
Lab Results  Component Value Date   TSH 4.440 09/02/2016

## 2018-03-06 ENCOUNTER — Other Ambulatory Visit (INDEPENDENT_AMBULATORY_CARE_PROVIDER_SITE_OTHER): Payer: Medicare Other

## 2018-03-06 DIAGNOSIS — D649 Anemia, unspecified: Secondary | ICD-10-CM | POA: Diagnosis not present

## 2018-03-06 NOTE — Addendum Note (Signed)
Addended by: Arby Barrette on: 03/06/2018 01:53 PM   Modules accepted: Orders

## 2018-03-07 LAB — FOLATE RBC: RBC Folate: 1179 ng/mL RBC (ref >280)

## 2018-03-08 LAB — PROTEIN ELECTROPHORESIS,RANDOM URN
Albumin: 43 %
Alpha-1-Globulin, U: 4 %
Alpha-2-Globulin, U: 15 %
Beta Globulin, U: 19 %
Creatinine, Urine: 86 mg/dL (ref 20–275)
Gamma Globulin, U: 20 %
Protein/Creat Ratio: 174 mg/g creat — ABNORMAL HIGH (ref 21–161)
Total Protein, Urine: 15 mg/dL (ref 5–24)

## 2018-03-10 LAB — TSH: TSH: 4.46 mIU/L (ref 0.40–4.50)

## 2018-03-10 LAB — PROTEIN ELECTROPHORESIS, SERUM
Albumin ELP: 3.9 g/dL (ref 3.8–4.8)
Alpha 1: 0.3 g/dL (ref 0.2–0.3)
Alpha 2: 0.9 g/dL (ref 0.5–0.9)
Beta 2: 0.4 g/dL (ref 0.2–0.5)
Beta Globulin: 0.5 g/dL (ref 0.4–0.6)
Gamma Globulin: 1 g/dL (ref 0.8–1.7)
Total Protein: 6.9 g/dL (ref 6.1–8.1)

## 2018-03-10 LAB — VITAMIN B12: Vitamin B-12: 549 pg/mL (ref 200–1100)

## 2018-03-10 LAB — IRON,TIBC AND FERRITIN PANEL
%SAT: 29 % (calc) (ref 11–50)
Ferritin: 31 ng/mL (ref 20–288)
Iron: 93 ug/dL (ref 45–160)
TIBC: 324 mcg/dL (calc) (ref 250–450)

## 2018-03-16 ENCOUNTER — Other Ambulatory Visit: Payer: Self-pay | Admitting: Internal Medicine

## 2018-03-16 DIAGNOSIS — D649 Anemia, unspecified: Secondary | ICD-10-CM

## 2018-03-16 NOTE — Progress Notes (Unsigned)
a 

## 2018-03-23 ENCOUNTER — Ambulatory Visit
Admission: RE | Admit: 2018-03-23 | Discharge: 2018-03-23 | Disposition: A | Discharge status: Home / Self Care | Payer: Medicare Other | Source: Ambulatory Visit | Attending: Internal Medicine | Admitting: Internal Medicine

## 2018-03-23 DIAGNOSIS — Z1231 Encounter for screening mammogram for malignant neoplasm of breast: Secondary | ICD-10-CM | POA: Diagnosis not present

## 2018-03-27 ENCOUNTER — Inpatient Hospital Stay: Payer: Medicare Other

## 2018-03-27 ENCOUNTER — Encounter: Payer: Self-pay | Admitting: Oncology

## 2018-03-27 ENCOUNTER — Other Ambulatory Visit: Payer: Self-pay

## 2018-03-27 ENCOUNTER — Inpatient Hospital Stay: Payer: Medicare Other | Attending: Oncology | Admitting: Oncology

## 2018-03-27 VITALS — BP 147/80 | HR 69 | Temp 97.6°F | Resp 18 | Wt 150.3 lb

## 2018-03-27 DIAGNOSIS — Z803 Family history of malignant neoplasm of breast: Secondary | ICD-10-CM | POA: Insufficient documentation

## 2018-03-27 DIAGNOSIS — D631 Anemia in chronic kidney disease: Secondary | ICD-10-CM | POA: Insufficient documentation

## 2018-03-27 DIAGNOSIS — N183 Chronic kidney disease, stage 3 unspecified: Secondary | ICD-10-CM

## 2018-03-27 DIAGNOSIS — D649 Anemia, unspecified: Secondary | ICD-10-CM

## 2018-03-27 DIAGNOSIS — Z7982 Long term (current) use of aspirin: Secondary | ICD-10-CM | POA: Insufficient documentation

## 2018-03-27 DIAGNOSIS — Z79899 Other long term (current) drug therapy: Secondary | ICD-10-CM | POA: Diagnosis not present

## 2018-03-27 DIAGNOSIS — Z87891 Personal history of nicotine dependence: Secondary | ICD-10-CM | POA: Insufficient documentation

## 2018-03-27 LAB — CBC WITH DIFFERENTIAL/PLATELET
Basophils Absolute: 0 10*3/uL (ref 0–0.1)
Basophils Relative: 1 %
Eosinophils Absolute: 0.6 10*3/uL (ref 0–0.7)
Eosinophils Relative: 11 %
HCT: 33.6 % — ABNORMAL LOW (ref 35.0–47.0)
Hemoglobin: 11.5 g/dL — ABNORMAL LOW (ref 12.0–16.0)
Lymphocytes Relative: 39 %
Lymphs Abs: 2.3 10*3/uL (ref 1.0–3.6)
MCH: 29.3 pg (ref 26.0–34.0)
MCHC: 34.3 g/dL (ref 32.0–36.0)
MCV: 85.5 fL (ref 80.0–100.0)
Monocytes Absolute: 0.5 10*3/uL (ref 0.2–0.9)
Monocytes Relative: 9 %
Neutro Abs: 2.5 10*3/uL (ref 1.4–6.5)
Neutrophils Relative %: 40 %
Platelets: 269 10*3/uL (ref 150–440)
RBC: 3.93 MIL/uL (ref 3.80–5.20)
RDW: 13.8 % (ref 11.5–14.5)
WBC: 6 10*3/uL (ref 3.6–11.0)

## 2018-03-27 LAB — RETICULOCYTES
RBC.: 3.96 MIL/uL (ref 3.80–5.20)
Retic Count, Absolute: 43.6 10*3/uL (ref 19.0–183.0)
Retic Ct Pct: 1.1 % (ref 0.4–3.1)

## 2018-03-27 LAB — IRON AND TIBC
Iron: 102 ug/dL (ref 28–170)
Saturation Ratios: 24 % (ref 10.4–31.8)
TIBC: 418 ug/dL (ref 250–450)
UIBC: 316 ug/dL

## 2018-03-27 LAB — FERRITIN: Ferritin: 22 ng/mL (ref 11–307)

## 2018-03-27 LAB — TECHNOLOGIST SMEAR REVIEW

## 2018-03-27 MED ORDER — FERROUS SULFATE 325 (65 FE) MG PO TBEC: 325.0000 mg | DELAYED_RELEASE_TABLET | Freq: Three times a day (TID) | ORAL | 2 refills | Status: DC

## 2018-03-27 NOTE — Progress Notes (Signed)
New patient in today for anemia.  Referred by Dr Derrel Nip.

## 2018-03-27 NOTE — Progress Notes (Signed)
Hematology/Oncology Consult note [facility] [phone] [phone]   Patient Care Team: Crecencio Mc, MD as PCP - General (Internal Medicine)  REFERRING PROVIDER: Crecencio Mc, MD CHIEF COMPLAINTS/PURPOSE OF CONSULTATION:  Evaluation of anemia and fatigue  HISTORY OF PRESENTING ILLNESS:  Sabrina [name] is a  77 y.o.  female with PMH listed below who was referred to me for evaluation of anemia.  Patient recently had lab work done which revealed anemia with hemoglobin 11.5.   Reviewed patient's previous labs, her hemoglobin ranges from 10.8 to 11.8 in the past.  Reports feeling fatigue.  Patient denies weight loss, easy bruising, hematochezia, hemoptysis.  Has history of CKD and follows up with nephrologist.  Chronic pain syndrome. On Tramadol and fentanyl patch.   Review of Systems  Constitutional: Positive for malaise/fatigue. Negative for chills, fever and weight loss.  HENT: Negative for congestion, ear discharge, ear pain, nosebleeds, sinus pain and sore throat.   Eyes: Negative for double vision, photophobia, pain, discharge and redness.  Respiratory: Negative for cough, hemoptysis, sputum production, shortness of breath and wheezing.   Cardiovascular: Negative for chest pain, palpitations, orthopnea, claudication and leg swelling.  Gastrointestinal: Positive for heartburn. Negative for abdominal pain, blood in stool, constipation, diarrhea, melena, nausea and vomiting.  Genitourinary: Negative for dysuria, flank pain, frequency and hematuria.  Musculoskeletal: Positive for back pain. Negative for myalgias and neck pain.  Skin: Negative for itching and rash.  Neurological: Negative for dizziness, tingling, tremors, focal weakness, weakness and headaches.  Endo/Heme/Allergies: Negative for environmental allergies. Does not bruise/bleed easily.  Psychiatric/Behavioral: Negative for depression and hallucinations. The patient is  not nervous/anxious.     MEDICAL HISTORY:  Past Medical History:  Diagnosis Date  . Anemia of chronic kidney failure   . Anxiety   . CAD (coronary artery disease)   . Carotid artery stenosis   . Chronic kidney disease, stage III (moderate) (HCC)    Followed by Dr. Juleen China  . Hyperlipidemia   . Hypertension   . Renal artery stenosis ([place])   . Secondary hyperparathyroidism ([place])   . 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 ABDOMINAL HYSTERECTOMY  1976  . CAROTID ARTERY ANGIOPLASTY Left   . CAROTID ENDARTERECTOMY Left   . CHOLECYSTECTOMY    . COLONOSCOPY WITH PROPOFOL N/A 08/16/2017   Procedure: COLONOSCOPY WITH PROPOFOL;  Surgeon: [name], MD;  Location: [facility] ENDOSCOPY;  Service: Endoscopy;  Laterality: N/A;  . CORONARY ANGIOPLASTY WITH STENT PLACEMENT  2000  . CORONARY ARTERY BYPASS GRAFT  2000  . CYSTOSCOPY WITH STENT PLACEMENT Bilateral   . ESOPHAGOGASTRODUODENOSCOPY (EGD) WITH PROPOFOL N/A 08/16/2017   Procedure: ESOPHAGOGASTRODUODENOSCOPY (EGD) WITH PROPOFOL;  Surgeon: [name], MD;  Location: [facility] ENDOSCOPY;  Service: Endoscopy;  Laterality: N/A;  . RENAL ARTERY ANGIOPLASTY Bilateral mARCH 2015  . TOE AMPUTATION Right    small toe  . TONSILLECTOMY AND ADENOIDECTOMY    . TOTAL HIP ARTHROPLASTY Left   . TOTAL HIP ARTHROPLASTY Right 2015    SOCIAL HISTORY: Social History   Socioeconomic History  . Marital status: Divorced    Spouse name: Not on file  . Number of children: Not on file  . Years of education: Not on file  . Highest education level: Not on file  Occupational History  . Occupation: retired  Scientific laboratory technician  . Financial resource strain: Not  hard at all  . Food insecurity:    Worry: Never true    Inability: Never true  . Transportation needs:    Medical: No    Non-medical: No  Tobacco Use  . Smoking status: Former  Smoker    Last attempt to quit: 10/25/1998    Years since quitting: 19.4  . Smokeless tobacco: Never Used  Substance and Sexual Activity  . Alcohol use: No    Alcohol/week: 0.0 oz  . Drug use: No  . Sexual activity: Not Currently  Lifestyle  . Physical activity:    Days per week: 0 days    Minutes per session: 0 min  . Stress: Patient refused  Relationships  . Social connections:    Talks on phone: Patient refused    Gets together: Patient refused    Attends religious service: Patient refused    Active member of club or organization: Patient refused    Attends meetings of clubs or organizations: Patient refused    Relationship status: Patient refused  . Intimate partner violence:    Fear of current or ex partner: No    Emotionally abused: No    Physically abused: No    Forced sexual activity: No  Other Topics Concern  . Not on file  Social History Narrative  . Not on file    FAMILY HISTORY: Family History  Problem Relation Age of Onset  . Stroke Mother   . Hypertension Mother   . Diabetes Mother   . Hypertension Father   . Heart disease Sister        MI  . Multiple sclerosis Daughter   . Multiple sclerosis Son   . Cerebral aneurysm Son   . Seizures Son   . Cerebral aneurysm Son   . Breast cancer Paternal Aunt 32    ALLERGIES:  is allergic to citalopram; dilaudid [hydromorphone hcl]; hydrochlorothiazide; nsaids; nubain [nalbuphine hcl]; penicillins; prasugrel; and statins.  MEDICATIONS:  Current Outpatient Medications  Medication Sig Dispense Refill  . amLODipine (NORVASC) 10 MG tablet Take 1 tablet (10 mg total) by mouth daily. 90 tablet 0  . aspirin 81 MG tablet Take 81 mg by mouth daily.    . baclofen (LIORESAL) 10 MG tablet Take 2 tablets at bedtime and during the day as needed for muscle spasm 120 tablet 5  . chlordiazePOXIDE (LIBRIUM) 25 MG capsule TAKE ONE CAPSULE BY MOUTH ONCE DAILY AS NEEDED FOR ANXIETY 30 capsule 5  . clopidogrel (PLAVIX) 75 MG tablet  Take 1 tablet (75 mg total) daily by mouth. 90 tablet 2  . dicyclomine (BENTYL) 10 MG/5ML syrup Take 5 mLs (10 mg total) by mouth 4 (four) times daily -  before meals and at bedtime. 473 mL 12  . ezetimibe (ZETIA) 10 MG tablet Take 1 tablet (10 mg total) by mouth daily. 30 tablet 5  . fentaNYL (DURAGESIC) 75 MCG/HR Place 1 patch (75 mcg total) onto the skin every 3 (three) days. 10 patch 0  . hydrALAZINE (APRESOLINE) 50 MG tablet TAKE ONE TABLET BY MOUTH THREE TIMES DAILY AS NEEDED 270 tablet 0  . labetalol (NORMODYNE) 200 MG tablet TAKE ONE-HALF TABLET BY MOUTH THREE TIMES DAILY 135 tablet 1  . lisinopril (PRINIVIL,ZESTRIL) 20 MG tablet TAKE 1 TABLET BY MOUTH TWICE DAILY 180 tablet 0  . NEXIUM 40 MG capsule Take 1 capsule (40 mg total) by mouth daily. 90 capsule 3  . Omega 3 1000 MG CAPS Take by mouth.    . traMADol (ULTRAM) 50  MG tablet TAKE TWO TABLETS BY MOUTH EVERY 8 HOURS AS NEEDED 180 tablet 3   No current facility-administered medications for this visit.      PHYSICAL EXAMINATION: ECOG PERFORMANCE STATUS: 1 - Symptomatic but completely ambulatory Vitals:   03/27/18 1358  BP: (!) 147/80  Pulse: 69  Resp: 18  Temp: 97.6 F (36.4 C)  SpO2: 100%   Filed Weights   03/27/18 1358  Weight: 150 lb 5 oz (68.2 kg)    Physical Exam  Constitutional: She is oriented to person, place, and time. She appears well-developed and well-nourished. No distress.  HENT:  Head: Normocephalic and atraumatic.  Right Ear: External ear normal.  Left Ear: External ear normal.  Eyes: Pupils are equal, round, and reactive to light. Conjunctivae and EOM are normal. No scleral icterus.  Neck: Normal range of motion. Neck supple.  Cardiovascular: Normal rate, regular rhythm and normal heart sounds.  Pulmonary/Chest: Effort normal and breath sounds normal. No respiratory distress. She has no wheezes. She has no rales. She exhibits no tenderness.  Abdominal: Soft. Bowel sounds are normal. She exhibits no  distension and no mass. There is no tenderness.  Musculoskeletal: Normal range of motion. She exhibits no edema or deformity.  Lymphadenopathy:    She has no cervical adenopathy.  Neurological: She is alert and oriented to person, place, and time. No cranial nerve deficit. Coordination normal.  Skin: Skin is warm and dry. No rash noted.  Psychiatric: She has a normal mood and affect. Her behavior is normal.     LABORATORY DATA:  I have reviewed the data as listed Lab Results  Component Value Date   WBC 6.0 03/27/2018   HGB 11.5 (L) 03/27/2018   HCT 33.6 (L) 03/27/2018   MCV 85.5 03/27/2018   PLT 269 03/27/2018   Recent Labs    07/06/17 1440 02/22/18 0835 03/06/18 1357  NA 137 136  --   K 4.1 4.6  --   CL 101 102  --   CO2 27 28  --   GLUCOSE 91 87  --   BUN 18 17  --   CREATININE 1.20 1.10  --   CALCIUM 9.6 9.3  --   PROT 7.1 6.8 6.9  ALBUMIN 4.0 3.9  --   AST 16 18  --   ALT 9 11  --   ALKPHOS 76 61  --   BILITOT 0.4 0.6  --        ASSESSMENT & PLAN:  1. Anemia, unspecified type   2. Stage 3 chronic kidney disease ([place])    She has had lab work-up done with primary care physician.  She has normal TSH, normal B12, TSAT 29, with ferritin 31.  Will check serum protein pheresis, reticulocytes, folate, smear. If above work-up negative, I think her anemia secondary to chronic kidney disease.  Her hemoglobin seems to be at acceptable level for patient with CKD. As long as hemoglobin is above 10, she does not need erythropoietin treatment. Iron panel reflects normal iron store however in some patient with CKD, repeating iron potentially can further increase their hemoglobin.  I advised patient to start taking ferrous sulfate 325 mg 2-3 times a day with meals.  If she can tolerate  Recheck CBC, iron ferritin in 2 months and follow-up with me. All questions were answered. The patient knows to call the clinic with any problems questions or concerns.  Return of visit: 2   months Thank you for this kind referral and  the opportunity to participate in the care of this patient. A copy of today's note is routed to referring provider  Total face to face encounter time for this patient visit was 45 min. >50% of the time was  spent in counseling and coordination of care.    Earlie Server, MD, PhD Hematology Oncology [facility] Pager- [phone] 03/27/2018

## 2018-03-29 LAB — MULTIPLE MYELOMA PANEL, SERUM
Albumin SerPl Elph-Mcnc: 3.8 g/dL (ref 2.9–4.4)
Albumin/Glob SerPl: 1.2 (ref 0.7–1.7)
Alpha 1: 0.2 g/dL (ref 0.0–0.4)
Alpha2 Glob SerPl Elph-Mcnc: 0.8 g/dL (ref 0.4–1.0)
B-Globulin SerPl Elph-Mcnc: 1.1 g/dL (ref 0.7–1.3)
Gamma Glob SerPl Elph-Mcnc: 1.2 g/dL (ref 0.4–1.8)
Globulin, Total: 3.3 g/dL (ref 2.2–3.9)
IgA: 297 mg/dL (ref 64–422)
IgG (Immunoglobin G), Serum: 1151 mg/dL (ref 700–1600)
IgM (Immunoglobulin M), Srm: 18 mg/dL — ABNORMAL LOW (ref 26–217)
Total Protein ELP: 7.1 g/dL (ref 6.0–8.5)

## 2018-04-19 ENCOUNTER — Other Ambulatory Visit: Payer: Self-pay | Admitting: Internal Medicine

## 2018-04-19 NOTE — Telephone Encounter (Signed)
Refilled: 02/20/2018 Last OV: 02/20/2018 Next OV: 05/22/2018

## 2018-04-19 NOTE — Telephone Encounter (Signed)
Last seen on 02-20-18, last filled on 02-20-18 next appt 05-22-18.

## 2018-04-19 NOTE — Telephone Encounter (Signed)
Copied from [place] [id number]. Topic: Quick Communication - Rx Refill/Question >> Apr 19, 2018 10:17 AM Lennox Solders wrote: Medication: fentanyl 75 mg  Has the patient contacted their pharmacy? No (Agent: If no, request that the patient contact the pharmacy for the refill.) pt would like to pick up rx on 04-21-18 (Agent: If yes, when and what did the pharmacy advise?)  Preferred Pharmacy (with phone number or street name): [facility] [place] Agent: Please be advised that RX refills may take up to 3 business days. We ask that you follow-up with your pharmacy.

## 2018-04-20 MED ORDER — FENTANYL 75 MCG/HR TD PT72: 75.0000 ug | MEDICATED_PATCH | TRANSDERMAL | 0 refills | Status: DC

## 2018-04-20 NOTE — Telephone Encounter (Signed)
Pt has been notified that rx has been refilled for one month and that we have placed it up front for her to pick up. Pt gave a verbal understanding.

## 2018-04-20 NOTE — Telephone Encounter (Signed)
Refilled for one month

## 2018-05-15 ENCOUNTER — Other Ambulatory Visit: Payer: Self-pay | Admitting: Internal Medicine

## 2018-05-16 ENCOUNTER — Encounter: Payer: Self-pay | Admitting: Internal Medicine

## 2018-05-16 ENCOUNTER — Other Ambulatory Visit: Payer: Self-pay | Admitting: Radiology

## 2018-05-16 ENCOUNTER — Ambulatory Visit (INDEPENDENT_AMBULATORY_CARE_PROVIDER_SITE_OTHER): Payer: Medicare Other | Admitting: Internal Medicine

## 2018-05-16 DIAGNOSIS — I150 Renovascular hypertension: Secondary | ICD-10-CM | POA: Diagnosis not present

## 2018-05-16 DIAGNOSIS — R1013 Epigastric pain: Secondary | ICD-10-CM

## 2018-05-16 DIAGNOSIS — I701 Atherosclerosis of renal artery: Secondary | ICD-10-CM

## 2018-05-16 DIAGNOSIS — G8929 Other chronic pain: Secondary | ICD-10-CM | POA: Diagnosis not present

## 2018-05-16 DIAGNOSIS — M47812 Spondylosis without myelopathy or radiculopathy, cervical region: Secondary | ICD-10-CM | POA: Diagnosis not present

## 2018-05-16 DIAGNOSIS — I739 Peripheral vascular disease, unspecified: Secondary | ICD-10-CM

## 2018-05-16 DIAGNOSIS — D649 Anemia, unspecified: Secondary | ICD-10-CM

## 2018-05-16 DIAGNOSIS — R519 Headache, unspecified: Secondary | ICD-10-CM

## 2018-05-16 DIAGNOSIS — R51 Headache: Secondary | ICD-10-CM | POA: Diagnosis not present

## 2018-05-16 MED ORDER — OXYCODONE-ACETAMINOPHEN 10-325 MG PO TABS: 1.0000 | ORAL_TABLET | ORAL | 0 refills | Status: DC | PRN

## 2018-05-16 MED ORDER — PANTOPRAZOLE SODIUM 40 MG PO TBEC: 40.0000 mg | DELAYED_RELEASE_TABLET | Freq: Every day | ORAL | 3 refills | Status: DC

## 2018-05-16 NOTE — Assessment & Plan Note (Signed)
No particular pattern,  And she has cervical spondylosis and carotid artery stenosis  At last visit we ruled out  potential diagosis of vasculitis with normal CRP and  ESR   Lab Results  Component Value Date   ESRSEDRATE 24 02/22/2018   Lab Results  Component Value Date   CRP 0.2 (L) 02/22/2018

## 2018-05-16 NOTE — Progress Notes (Signed)
Subjective:  Patient ID: Sabrina [name], female    DOB: 1941-10-15  Age: 77 y.o. MRN: [id number]  CC: Diagnoses of Peripheral vascular disease ([place]), Renovascular hypertension, Acute nonintractable headache, unspecified headache type, Cervical spondylosis without myelopathy, Anemia, unspecified type, and Chronic epigastric pain were pertinent to this visit.  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  She feels terrible every single day.  Headache , nausea,  Abdominal pain and dizziness.   Anemia:  She is not tolerating the Iron supplement Oncology has her taking three times daily bc it is making her dizzy and nauseated.  She has decidedly better tolerance of an OTC iron supplement.    .  She has been having frequent headaches that often occur at the right temple, sometimes at the top  of head . Gets temporary relief with tramadol and tylenol. No vision changes .    Dizziness occurs when standing and walking across parking lots.  Does not occur at rest.  Not vertigo..,  Has non critical PAD of carotids by ultrasound done in Dec 2018     Persistent abdominal pain previously managed with nexium  But has not been able to fill rx since  June 3  because she could not afford it anymore.  Did not feel that the OTC medication  Was the same ,  But only took 20 mg daily.  Can't afford to buy the OTC and double the dose.   bp constantly  elevated whenever she is in the office. Marland Kitchen  However, she had hypotension (systolic of 90) while at daughter's office.   Outpatient Medications Prior to Visit  Medication Sig Dispense Refill  . amLODipine (NORVASC) 10 MG tablet TAKE 1 TABLET BY MOUTH ONCE DAILY 90 tablet 1  . aspirin 81 MG tablet Take 81 mg by mouth daily.    . baclofen (LIORESAL) 10 MG tablet Take 2 tablets at bedtime and during the day as needed for muscle spasm 120 tablet 5  . chlordiazePOXIDE  (LIBRIUM) 25 MG capsule TAKE ONE CAPSULE BY MOUTH ONCE DAILY AS NEEDED FOR ANXIETY 30 capsule 5  . clopidogrel (PLAVIX) 75 MG tablet Take 1 tablet (75 mg total) daily by mouth. 90 tablet 2  . dicyclomine (BENTYL) 10 MG/5ML syrup Take 5 mLs (10 mg total) by mouth 4 (four) times daily -  before meals and at bedtime. 473 mL 12  . ezetimibe (ZETIA) 10 MG tablet Take 1 tablet (10 mg total) by mouth daily. 30 tablet 5  . fentaNYL (DURAGESIC) 75 MCG/HR Place 1 patch (75 mcg total) onto the skin every 3 (three) days. 10 patch 0  . hydrALAZINE (APRESOLINE) 50 MG tablet TAKE ONE TABLET BY MOUTH THREE TIMES DAILY AS NEEDED 270 tablet 0  . labetalol (NORMODYNE) 200 MG tablet TAKE ONE-HALF TABLET BY MOUTH THREE TIMES DAILY 135 tablet 1  . lisinopril (PRINIVIL,ZESTRIL) 20 MG tablet TAKE 1 TABLET BY MOUTH TWICE DAILY 180 tablet 0  . NEXIUM 40 MG capsule Take 1 capsule (40 mg total) by mouth daily. 90 capsule 3  . Omega 3 1000 MG CAPS Take by mouth.    . traMADol (ULTRAM) 50 MG tablet TAKE TWO TABLETS BY MOUTH EVERY 8 HOURS AS NEEDED 180 tablet 3  . ferrous sulfate 325 (65 FE) MG EC tablet Take 1 tablet (325 mg total) by mouth 3 (three) times daily with meals. (Patient not taking: Reported  on 05/16/2018) 90 tablet 2   No facility-administered medications prior to visit.     Review of Systems;  Patient denies headache, fevers, malaise, unintentional weight loss, skin rash, eye pain, sinus congestion and sinus pain, sore throat, dysphagia,  hemoptysis , cough, dyspnea, wheezing, chest pain, palpitations, orthopnea, edema, abdominal pain, nausea, melena, diarrhea, constipation, flank pain, dysuria, hematuria, urinary  Frequency, nocturia, numbness, tingling, seizures,  Focal weakness, Loss of consciousness,  Tremor, insomnia, depression, anxiety, and suicidal ideation.      Objective:  BP (!) 152/80 (BP Location: Left Arm, Patient Position: Sitting, Cuff Size: Normal)   Pulse 66   Temp 98.3 F (36.8 C) (Oral)    Resp 15   Ht '5\' 2"'$  (1.575 m)   Wt 148 lb 9.6 oz (67.4 kg)   SpO2 99%   BMI 27.18 kg/m   BP Readings from Last 3 Encounters:  05/16/18 (!) 152/80  03/27/18 (!) 147/80  02/20/18 122/70    Wt Readings from Last 3 Encounters:  05/16/18 148 lb 9.6 oz (67.4 kg)  03/27/18 150 lb 5 oz (68.2 kg)  02/20/18 153 lb 9.6 oz (69.7 kg)    General appearance: alert, cooperative and appears stated age Ears: normal TM's and external ear canals both ears Throat: lips, mucosa, and tongue normal; teeth and gums normal Neck: no adenopathy, no carotid bruit, supple, symmetrical, trachea midline and thyroid not enlarged, symmetric, no tenderness/mass/nodules Back: symmetric, no curvature. ROM normal. No CVA tenderness. Lungs: clear to auscultation bilaterally Heart: regular rate and rhythm, S1, S2 normal, no murmur, click, rub or gallop Abdomen: soft, non-tender; bowel sounds normal; no masses,  no organomegaly Pulses: 2+ and symmetric Skin: Skin color, texture, turgor normal. No rashes or lesions Lymph nodes: Cervical, supraclavicular, and axillary nodes normal. Neuro:  awake and interactive with normal mood and affect. Higher cortical functions are normal. Speech is clear without word-finding difficulty or dysarthria. Extraocular movements are intact. Visual fields of both eyes are grossly intact. Sensation to light touch is grossly intact bilaterally of upper and lower extremities. Motor examination shows 4+/5 symmetric hand grip and upper extremity and 5/5 lower extremity strength. There is no pronation or drift. Gait is non-ataxic    Lab Results  Component Value Date   HGBA1C 5.9 07/19/2016   HGBA1C 5.8 06/02/2015    Lab Results  Component Value Date   CREATININE 1.10 02/22/2018   CREATININE 1.20 07/06/2017   CREATININE 1.18 (H) 02/07/2017    Lab Results  Component Value Date   WBC 6.0 03/27/2018   HGB 11.5 (L) 03/27/2018   HCT 33.6 (L) 03/27/2018   PLT 269 03/27/2018   GLUCOSE 87  02/22/2018   CHOL 167 02/22/2018   TRIG 53.0 02/22/2018   HDL 54.80 02/22/2018   LDLDIRECT 109.0 03/05/2016   LDLCALC 102 (H) 02/22/2018   ALT 11 02/22/2018   AST 18 02/22/2018   NA 136 02/22/2018   K 4.6 02/22/2018   CL 102 02/22/2018   CREATININE 1.10 02/22/2018   BUN 17 02/22/2018   CO2 28 02/22/2018   TSH 4.46 03/06/2018   INR 1.0 03/27/2014   HGBA1C 5.9 07/19/2016     Assessment & Plan:   Problem List Items Addressed This Visit    Peripheral vascular disease ([place])    S/p bilateral renal artery stents, patent by Nov 2017 dopplers, and is s/p left carotid endarterectomy with Nov 2017 doppler showing a low flow state  On the  right secondary to 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  If her blood pressure is low at home due to iatrogenic management of white coat hypertension,  She may be symptomatic from the low flow state          HTN (hypertension)    Home readings much lower.  Advised to reduce amlodipine to 5 mg daily for persistent systolic blood pressure below 120      Headache    No particular pattern,  And she has cervical spondylosis and carotid artery stenosis  At last visit we ruled out  potential diagosis of vasculitis with normal CRP and  ESR   Lab Results  Component Value Date   ESRSEDRATE 24 02/22/2018   Lab Results  Component Value Date   CRP 0.2 (L) 02/22/2018         Relevant Medications   oxyCODONE-acetaminophen (PERCOCET) 10-325 MG tablet   Chronic epigastric pain    She continues to report diffuse abdominal pain of varying description for the past several months that is controlled only with Nexium 40 mg but not completely,  And now cannot afford Nexium.  Samples given.  and has lost 18 lbs over the ls year . She is taking a PPI  Daily and in the past has deferred GI consult.  Except for a few days of loose stool her bowel habits are notable for constipation. 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  Discussed trial of protonix and if not controlled,  Needs GI for EGD   Lab Results  Component Value Date   CREATININE 1.10 02/22/2018   Lab Results  Component Value Date   LIPASE 16.0 07/06/2017         Relevant Medications   oxyCODONE-acetaminophen (PERCOCET) 10-325 MG tablet   Cervical spondylosis without myelopathy    Her pain has been managed with fentanyl thus far but she is requesting a change to percocet. rx written for one every 4 hours      Relevant Medications   oxyCODONE-acetaminophen (PERCOCET) 10-325 MG tablet   Anemia    She is not tolerating tid iron supplements.  Reduce does to once daily         A total of 40 minutes was spent with patient more than half of which was spent in counseling patient on the above mentioned issues , reviewing and explaining recent labs and imaging studies done, and coordination of care.  I am having Imagine A. Pirani start on oxyCODONE-acetaminophen and pantoprazole. I am also having her maintain her aspirin, dicyclomine, clopidogrel, ezetimibe, hydrALAZINE, chlordiazePOXIDE, baclofen, traMADol, labetalol, lisinopril, NEXIUM, Omega 3, ferrous sulfate, fentaNYL, and amLODipine.  Meds ordered this encounter  Medications  . oxyCODONE-acetaminophen (PERCOCET) 10-325 MG tablet    Sig: Take 1 tablet by mouth every 4 (four) hours as needed for pain.    Dispense:  42 tablet    Refill:  0    Patient transitioning from Fentanyl to percocet.  DO NOT FILL UNTIL FENTANYL REFILL IS DUE  . pantoprazole (PROTONIX) 40 MG tablet    Sig: Take 1 tablet (40 mg total) by mouth daily.    Dispense:  30 tablet    Refill:  3    There are no discontinued medications.  Follow-up: Return in about 1 week (around 05/23/2018) for 4:30.   Crecencio Mc, MD

## 2018-05-16 NOTE — Assessment & Plan Note (Signed)
She is not tolerating tid iron supplements.  Reduce does to once daily

## 2018-05-16 NOTE — Assessment & Plan Note (Addendum)
S/p bilateral renal artery stents, patent by Nov 2017 dopplers, and is s/p left carotid endarterectomy with Nov 2017 doppler showing a low flow state  On the  right secondary to chronic occlusion at the bifurcation of the common carotid.  If her blood pressure is low at home due to iatrogenic management of white coat hypertension,  She may be symptomatic from the low flow state

## 2018-05-16 NOTE — Patient Instructions (Addendum)
Reduce your daily iron to the one you buy over the counter  Once daily after a meal    we are transitoning you from fentanyl patch to percocet.  Do not start taking the percocet until your last patch has been removed.  Then you can use percocet every 4 hours as needed for pain    We are trying pantoprazole instead of nexium .  Sent to [facility]  If you are still having abdominal pain you can add gaviscon but I recommend seeing GI if that is the case.   Referral back to [facility] a for dizziness and headaches.   If your home bp readings are < 120/70 this week,  Reduce the amlodipine dose to 5 mg  Daily . Continue lisinopril and labetalol.    Return next  Tuesday at 4:30  To see me

## 2018-05-16 NOTE — Assessment & Plan Note (Signed)
She continues to report diffuse abdominal pain of varying description for the past several months that is controlled only with Nexium 40 mg but not completely,  And now cannot afford Nexium.  Samples given.  and has lost 18 lbs over the ls year . She is taking a PPI  Daily and in the past has deferred GI consult.  Except for a few days of loose stool her bowel habits are notable for constipation. She has abdominal bruit on exam and review of records notes a < 3 cm infrarenal AAA on ct done in 2011 , with no recent vascular follow up for same.  Discussed trial of protonix and if not controlled,  Needs GI for EGD   Lab Results  Component Value Date   CREATININE 1.10 02/22/2018   Lab Results  Component Value Date   LIPASE 16.0 07/06/2017

## 2018-05-16 NOTE — Assessment & Plan Note (Signed)
Home readings much lower.  Advised to reduce amlodipine to 5 mg daily for persistent systolic blood pressure below 120

## 2018-05-16 NOTE — Assessment & Plan Note (Addendum)
Her pain has been managed with fentanyl thus far but she is requesting a change to percocet. rx written for one every 4 hours

## 2018-05-22 ENCOUNTER — Ambulatory Visit: Payer: Medicare Other | Admitting: Internal Medicine

## 2018-05-23 ENCOUNTER — Ambulatory Visit: Payer: Medicare Other | Admitting: Internal Medicine

## 2018-05-24 ENCOUNTER — Inpatient Hospital Stay: Payer: Medicare Other | Attending: Oncology

## 2018-05-24 DIAGNOSIS — N183 Chronic kidney disease, stage 3 unspecified: Secondary | ICD-10-CM

## 2018-05-24 DIAGNOSIS — D649 Anemia, unspecified: Secondary | ICD-10-CM

## 2018-05-24 DIAGNOSIS — D631 Anemia in chronic kidney disease: Secondary | ICD-10-CM | POA: Diagnosis not present

## 2018-05-24 LAB — CBC WITH DIFFERENTIAL/PLATELET
Basophils Absolute: 0 10*3/uL (ref 0–0.1)
Basophils Relative: 1 %
Eosinophils Absolute: 0.4 10*3/uL (ref 0–0.7)
Eosinophils Relative: 8 %
HCT: 35.8 % (ref 35.0–47.0)
Hemoglobin: 12 g/dL (ref 12.0–16.0)
Lymphocytes Relative: 27 %
Lymphs Abs: 1.4 10*3/uL (ref 1.0–3.6)
MCH: 29.2 pg (ref 26.0–34.0)
MCHC: 33.6 g/dL (ref 32.0–36.0)
MCV: 86.8 fL (ref 80.0–100.0)
Monocytes Absolute: 0.5 10*3/uL (ref 0.2–0.9)
Monocytes Relative: 11 %
Neutro Abs: 2.7 10*3/uL (ref 1.4–6.5)
Neutrophils Relative %: 53 %
Platelets: 250 10*3/uL (ref 150–440)
RBC: 4.13 MIL/uL (ref 3.80–5.20)
RDW: 13.8 % (ref 11.5–14.5)
WBC: 5.1 10*3/uL (ref 3.6–11.0)

## 2018-05-24 LAB — IRON AND TIBC
Iron: 108 ug/dL (ref 28–170)
Saturation Ratios: 30 % (ref 10.4–31.8)
TIBC: 357 ug/dL (ref 250–450)
UIBC: 249 ug/dL

## 2018-05-24 LAB — FERRITIN: Ferritin: 55 ng/mL (ref 11–307)

## 2018-05-26 ENCOUNTER — Inpatient Hospital Stay: Payer: Medicare Other | Attending: Oncology | Admitting: Oncology

## 2018-05-26 ENCOUNTER — Encounter: Payer: Self-pay | Admitting: Oncology

## 2018-05-26 ENCOUNTER — Other Ambulatory Visit: Payer: Self-pay

## 2018-05-26 VITALS — BP 196/81 | HR 71 | Temp 96.9°F | Resp 18 | Wt 149.6 lb

## 2018-05-26 DIAGNOSIS — Z87891 Personal history of nicotine dependence: Secondary | ICD-10-CM | POA: Insufficient documentation

## 2018-05-26 DIAGNOSIS — R5383 Other fatigue: Secondary | ICD-10-CM | POA: Diagnosis not present

## 2018-05-26 DIAGNOSIS — D509 Iron deficiency anemia, unspecified: Secondary | ICD-10-CM | POA: Diagnosis not present

## 2018-05-26 DIAGNOSIS — R51 Headache: Secondary | ICD-10-CM | POA: Insufficient documentation

## 2018-05-26 DIAGNOSIS — D631 Anemia in chronic kidney disease: Secondary | ICD-10-CM | POA: Diagnosis not present

## 2018-05-26 DIAGNOSIS — Z79899 Other long term (current) drug therapy: Secondary | ICD-10-CM | POA: Insufficient documentation

## 2018-05-26 DIAGNOSIS — N183 Chronic kidney disease, stage 3 (moderate): Secondary | ICD-10-CM | POA: Diagnosis not present

## 2018-05-26 DIAGNOSIS — I10 Essential (primary) hypertension: Secondary | ICD-10-CM

## 2018-05-26 DIAGNOSIS — G894 Chronic pain syndrome: Secondary | ICD-10-CM | POA: Insufficient documentation

## 2018-05-26 DIAGNOSIS — Z7982 Long term (current) use of aspirin: Secondary | ICD-10-CM | POA: Diagnosis not present

## 2018-05-26 NOTE — Progress Notes (Signed)
Hematology/Oncology follow up  note [facility] [phone] [phone]   Patient Care Team: Crecencio Mc, MD as PCP - General (Internal Medicine)  REFERRING PROVIDER: Crecencio Mc, MD CHIEF COMPLAINTS/PURPOSE OF CONSULTATION:  Evaluation of anemia and fatigue  HISTORY OF PRESENTING ILLNESS:  Sabrina [name] is a  77 y.o.  female with PMH listed below who was referred to me for evaluation of anemia.  Patient recently had lab work done which revealed anemia with hemoglobin 11.5.   Reviewed patient's previous labs, her hemoglobin ranges from 10.8 to 11.8 in the past.  Reports feeling fatigue.  Patient denies weight loss, easy bruising, hematochezia, hemoptysis.  Has history of CKD and follows up with nephrologist.  Chronic pain syndrome. On Tramadol and fentanyl patch.  INTERVAL HISTORY Sabrina [name] is a 77 y.o. female who has above history reviewed by me today presents for follow up visit for management of anemia.   # Fatigue: improved. She was advised to take ferrous sulfate 325mg  TID. Patient reports that she can not tolerate. Instead she has been taking OTC Slow FE 45mg  daily which she can tolerates. She also eats a lot of spinach.   # Headache, she has acute exacerbation of headache today and she takes her hydralazin and tramadol in the exam room. She feels headache is better when I see her. Has a history of chronic headache. Denies any vision problem, slurred speech, focal weakness.    Review of Systems  Constitutional: Negative for chills, fever and weight loss.  HENT: Negative for congestion, ear discharge, ear pain, nosebleeds, sinus pain and sore throat.   Eyes: Negative for double vision, photophobia, pain, discharge and redness.  Respiratory: Negative for cough, hemoptysis, sputum production, shortness of breath and wheezing.   Cardiovascular: Negative for chest pain, palpitations, orthopnea, claudication and leg  swelling.  Gastrointestinal: Negative for abdominal pain, blood in stool, constipation, diarrhea, heartburn, melena, nausea and vomiting.  Genitourinary: Negative for dysuria, flank pain, frequency and hematuria.  Musculoskeletal: Positive for back pain. Negative for myalgias and neck pain.  Skin: Negative for itching and rash.  Neurological: Positive for headaches. Negative for dizziness, tingling, tremors, focal weakness and weakness.  Endo/Heme/Allergies: Negative for environmental allergies. Does not bruise/bleed easily.  Psychiatric/Behavioral: Negative for depression and hallucinations. The patient is not nervous/anxious.     MEDICAL HISTORY:  Past Medical History:  Diagnosis Date  . Anemia of chronic kidney failure   . Anxiety   . CAD (coronary artery disease)   . Carotid artery stenosis   . Chronic kidney disease, stage III (moderate) (HCC)    Followed by Dr. Juleen China  . Hyperlipidemia   . Hypertension   . Renal artery stenosis ([place])   . Secondary hyperparathyroidism ([place])   . 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 ABDOMINAL HYSTERECTOMY  1976  . CAROTID ARTERY ANGIOPLASTY Left   . CAROTID ENDARTERECTOMY Left   . CHOLECYSTECTOMY    . COLONOSCOPY WITH PROPOFOL N/A 08/16/2017   Procedure: COLONOSCOPY WITH PROPOFOL;  Surgeon: [name], MD;  Location: [facility] ENDOSCOPY;  Service: Endoscopy;  Laterality: N/A;  . CORONARY ANGIOPLASTY WITH STENT PLACEMENT  2000  . CORONARY ARTERY BYPASS GRAFT  2000  . CYSTOSCOPY WITH STENT PLACEMENT Bilateral   . ESOPHAGOGASTRODUODENOSCOPY (EGD) WITH PROPOFOL N/A 08/16/2017   Procedure: ESOPHAGOGASTRODUODENOSCOPY (EGD) WITH PROPOFOL;  Surgeon: Allen Norris,  Darren, MD;  Location: [place] ENDOSCOPY;  Service: Endoscopy;  Laterality: N/A;  . RENAL ARTERY ANGIOPLASTY Bilateral mARCH 2015  . TOE AMPUTATION Right    small toe  .  TONSILLECTOMY AND ADENOIDECTOMY    . TOTAL HIP ARTHROPLASTY Left   . TOTAL HIP ARTHROPLASTY Right 2015    SOCIAL HISTORY: Social History   Socioeconomic History  . Marital status: Divorced    Spouse name: Not on file  . Number of children: Not on file  . Years of education: Not on file  . Highest education level: Not on file  Occupational History  . Occupation: retired  Scientific laboratory technician  . Financial resource strain: Not hard at all  . Food insecurity:    Worry: Never true    Inability: Never true  . Transportation needs:    Medical: No    Non-medical: No  Tobacco Use  . Smoking status: Former Smoker    Last attempt to quit: 10/25/1998    Years since quitting: 19.5  . Smokeless tobacco: Never Used  Substance and Sexual Activity  . Alcohol use: No    Alcohol/week: 0.0 oz  . Drug use: No  . Sexual activity: Not Currently  Lifestyle  . Physical activity:    Days per week: 0 days    Minutes per session: 0 min  . Stress: Patient refused  Relationships  . Social connections:    Talks on phone: Patient refused    Gets together: Patient refused    Attends religious service: Patient refused    Active member of club or organization: Patient refused    Attends meetings of clubs or organizations: Patient refused    Relationship status: Patient refused  . Intimate partner violence:    Fear of current or ex partner: No    Emotionally abused: No    Physically abused: No    Forced sexual activity: No  Other Topics Concern  . Not on file  Social History Narrative  . Not on file    FAMILY HISTORY: Family History  Problem Relation Age of Onset  . Stroke Mother   . Hypertension Mother   . Diabetes Mother   . Hypertension Father   . Heart disease Sister        MI  . Multiple sclerosis Daughter   . Multiple sclerosis Son   . Cerebral aneurysm Son   . Seizures Son   . Cerebral aneurysm Son   . Breast cancer Paternal Aunt 31    ALLERGIES:  is allergic to citalopram;  dilaudid [hydromorphone hcl]; hydrochlorothiazide; nsaids; nubain [nalbuphine hcl]; penicillins; prasugrel; and statins.  MEDICATIONS:  Current Outpatient Medications  Medication Sig Dispense Refill  . aspirin 81 MG tablet Take 81 mg by mouth daily.    . baclofen (LIORESAL) 10 MG tablet Take 2 tablets at bedtime and during the day as needed for muscle spasm 120 tablet 5  . chlordiazePOXIDE (LIBRIUM) 25 MG capsule TAKE ONE CAPSULE BY MOUTH ONCE DAILY AS NEEDED FOR ANXIETY 30 capsule 5  . clopidogrel (PLAVIX) 75 MG tablet Take 1 tablet (75 mg total) daily by mouth. 90 tablet 2  . ezetimibe (ZETIA) 10 MG tablet Take 1 tablet (10 mg total) by mouth daily. 30 tablet 5  . ferrous sulfate 325 (65 FE) MG EC tablet Take 1 tablet (325 mg total) by mouth 3 (three) times daily with meals. 90 tablet 2  . hydrALAZINE (APRESOLINE) 50 MG tablet TAKE ONE TABLET BY MOUTH THREE TIMES DAILY AS NEEDED 270  tablet 0  . labetalol (NORMODYNE) 200 MG tablet TAKE ONE-HALF TABLET BY MOUTH THREE TIMES DAILY 135 tablet 1  . lisinopril (PRINIVIL,ZESTRIL) 20 MG tablet TAKE 1 TABLET BY MOUTH TWICE DAILY 180 tablet 0  . Omega 3 1000 MG CAPS Take by mouth.    . oxyCODONE-acetaminophen (PERCOCET) 10-325 MG tablet Take 1 tablet by mouth every 4 (four) hours as needed for pain. 42 tablet 0  . pantoprazole (PROTONIX) 40 MG tablet Take 1 tablet (40 mg total) by mouth daily. 30 tablet 3  . traMADol (ULTRAM) 50 MG tablet TAKE TWO TABLETS BY MOUTH EVERY 8 HOURS AS NEEDED 180 tablet 3  . amLODipine (NORVASC) 10 MG tablet TAKE 1 TABLET BY MOUTH ONCE DAILY (Patient not taking: Reported on 05/26/2018) 90 tablet 1  . dicyclomine (BENTYL) 10 MG/5ML syrup Take 5 mLs (10 mg total) by mouth 4 (four) times daily -  before meals and at bedtime. (Patient not taking: Reported on 05/26/2018) 473 mL 12  . fentaNYL (DURAGESIC) 75 MCG/HR Place 1 patch (75 mcg total) onto the skin every 3 (three) days. (Patient not taking: Reported on 05/26/2018) 10 patch 0  .  NEXIUM 40 MG capsule Take 1 capsule (40 mg total) by mouth daily. (Patient not taking: Reported on 05/26/2018) 90 capsule 3   No current facility-administered medications for this visit.      PHYSICAL EXAMINATION: ECOG PERFORMANCE STATUS: 1 - Symptomatic but completely ambulatory Vitals:   05/26/18 1413 05/26/18 1447  BP: (!) 218/102 (!) 196/81  Pulse: 81 71  Resp: 18   Temp: (!) 96.9 F (36.1 C)    Filed Weights   05/26/18 1413  Weight: 149 lb 9.6 oz (67.9 kg)    Physical Exam  Constitutional: She is oriented to person, place, and time. She appears well-developed and well-nourished. No distress.  HENT:  Head: Normocephalic and atraumatic.  Right Ear: External ear normal.  Left Ear: External ear normal.  Mouth/Throat: Oropharynx is clear and moist.  Eyes: Pupils are equal, round, and reactive to light. Conjunctivae and EOM are normal. No scleral icterus.  Neck: Normal range of motion. Neck supple.  Cardiovascular: Normal rate, regular rhythm and normal heart sounds.  Pulmonary/Chest: Effort normal and breath sounds normal. No respiratory distress. She has no wheezes. She has no rales. She exhibits no tenderness.  Abdominal: Soft. Bowel sounds are normal. She exhibits no distension and no mass. There is no tenderness.  Musculoskeletal: Normal range of motion. She exhibits no edema or deformity.  Lymphadenopathy:    She has no cervical adenopathy.  Neurological: She is alert and oriented to person, place, and time. No cranial nerve deficit. Coordination normal.  Skin: Skin is warm and dry. No rash noted. No erythema.  Psychiatric: She has a normal mood and affect. Her behavior is normal. Thought content normal.     LABORATORY DATA:  I have reviewed the data as listed Lab Results  Component Value Date   WBC 5.1 05/24/2018   HGB 12.0 05/24/2018   HCT 35.8 05/24/2018   MCV 86.8 05/24/2018   PLT 250 05/24/2018   Recent Labs    07/06/17 1440 02/22/18 0835 03/06/18 1357    NA 137 136  --   K 4.1 4.6  --   CL 101 102  --   CO2 27 28  --   GLUCOSE 91 87  --   BUN 18 17  --   CREATININE 1.20 1.10  --   CALCIUM 9.6 9.3  --  PROT 7.1 6.8 6.9  ALBUMIN 4.0 3.9  --   AST 16 18  --   ALT 9 11  --   ALKPHOS 76 61  --   BILITOT 0.4 0.6  --        ASSESSMENT & PLAN:  1. Iron deficiency anemia, unspecified iron deficiency anemia type   2. Hypertension, unspecified type   Labs are reviewed and discussed with patient.  Iron panel has improved. Hemoglobin has improved to 12. Advise patient to continue daily Slow Fe.   Uncontrolled hypertension, repeat BP in the clinic is slightly better and She is asymptomatic. Advise patient to monitor her blood pressure at home and if headache is persistent or blood pressure continues to be high, she should seek medical advise immediately. She voices understanding.    Return of visit: 4 months.  Total face to face encounter time for this patient visit was 15 min. >50% of the time was  spent in counseling and coordination of care.    Earlie Server, MD, PhD Hematology Oncology [facility] at [facility] Pager- [phone] 05/26/2018

## 2018-05-26 NOTE — Progress Notes (Signed)
Patient here for follow up. Upon assessment patient requested her blood pressure to be taken because she was having a headache. BP 218/102, then patient began holding her head and saying she had a headache pain 10/10. She took her hydralazine and a tramadol and began praying for headache to go away. BP reassessed and it was 211/101. Patient states she gets this severe headache about every 4-6 months and it makes her gums hurts. Whenever this happens at home she takes he hydralazine and vinegar.

## 2018-05-29 ENCOUNTER — Encounter: Payer: Self-pay | Admitting: Internal Medicine

## 2018-05-29 ENCOUNTER — Ambulatory Visit (INDEPENDENT_AMBULATORY_CARE_PROVIDER_SITE_OTHER): Payer: Medicare Other | Admitting: Internal Medicine

## 2018-05-29 DIAGNOSIS — N183 Chronic kidney disease, stage 3 unspecified: Secondary | ICD-10-CM

## 2018-05-29 DIAGNOSIS — F1190 Opioid use, unspecified, uncomplicated: Secondary | ICD-10-CM | POA: Diagnosis not present

## 2018-05-29 DIAGNOSIS — R1013 Epigastric pain: Secondary | ICD-10-CM | POA: Diagnosis not present

## 2018-05-29 DIAGNOSIS — I1310 Hypertensive heart and chronic kidney disease without heart failure, with stage 1 through stage 4 chronic kidney disease, or unspecified chronic kidney disease: Secondary | ICD-10-CM

## 2018-05-29 DIAGNOSIS — G8929 Other chronic pain: Secondary | ICD-10-CM

## 2018-05-29 DIAGNOSIS — I701 Atherosclerosis of renal artery: Secondary | ICD-10-CM

## 2018-05-29 MED ORDER — OXYCODONE-ACETAMINOPHEN 10-325 MG PO TABS: 1.0000 | ORAL_TABLET | ORAL | 0 refills | Status: DC | PRN

## 2018-05-29 NOTE — Progress Notes (Signed)
Subjective:  Patient ID: Sabrina [name], female    DOB: November 09, 1940  Age: 77 y.o. 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  HPI Sabrina [name] presents for one week follow up on hypertension and chronic pain with opioid change from fentanyl to oxycodone . Patient given a one week supply #42  (6 tablets daily).  Patient states that she never received the prescription   Patient has been checking BP daily at home with a new automatic BP monitor  and readings are all 130/80 or less.  She has significant elevations when she is in a medical  setting  Has not started the percocet yet because her fentanyl prescription has not run out  And 2) she does not have the rx   Nausea has improved with change in iron supplement and changing her  PPI to pantoprazole   Outpatient Medications Prior to Visit  Medication Sig Dispense Refill  . aspirin 81 MG tablet Take 81 mg by mouth daily.    . baclofen (LIORESAL) 10 MG tablet Take 2 tablets at bedtime and during the day as needed for muscle spasm 120 tablet 5  . chlordiazePOXIDE (LIBRIUM) 25 MG capsule TAKE ONE CAPSULE BY MOUTH ONCE DAILY AS NEEDED FOR ANXIETY 30 capsule 5  . clopidogrel (PLAVIX) 75 MG tablet Take 1 tablet (75 mg total) daily by mouth. 90 tablet 2  . ezetimibe (ZETIA) 10 MG tablet Take 1 tablet (10 mg total) by mouth daily. 30 tablet 5  . fentaNYL (DURAGESIC) 75 MCG/HR Place 1 patch (75 mcg total) onto the skin every 3 (three) days. 10 patch 0  . hydrALAZINE (APRESOLINE) 50 MG tablet TAKE ONE TABLET BY MOUTH THREE TIMES DAILY AS NEEDED 270 tablet 0  . labetalol (NORMODYNE) 200 MG tablet TAKE ONE-HALF TABLET BY MOUTH THREE TIMES DAILY 135 tablet 1  . lisinopril (PRINIVIL,ZESTRIL) 20 MG tablet TAKE 1 TABLET BY MOUTH TWICE DAILY 180 tablet 0  . Omega 3 1000 MG CAPS Take  by mouth.    . pantoprazole (PROTONIX) 40 MG tablet Take 1 tablet (40 mg total) by mouth daily. 30 tablet 3  . traMADol (ULTRAM) 50 MG tablet TAKE TWO TABLETS BY MOUTH EVERY 8 HOURS AS NEEDED 180 tablet 3  . oxyCODONE-acetaminophen (PERCOCET) 10-325 MG tablet Take 1 tablet by mouth every 4 (four) hours as needed for pain. 42 tablet 0  . amLODipine (NORVASC) 10 MG tablet TAKE 1 TABLET BY MOUTH ONCE DAILY (Patient not taking: Reported on 05/26/2018) 90 tablet 1  . dicyclomine (BENTYL) 10 MG/5ML syrup Take 5 mLs (10 mg total) by mouth 4 (four) times daily -  before meals and at bedtime. (Patient not taking: Reported on 05/26/2018) 473 mL 12  . ferrous sulfate 325 (65 FE) MG EC tablet Take 1 tablet (325 mg total) by mouth 3 (three) times daily with meals. (Patient not taking: Reported on 05/29/2018) 90 tablet 2  . NEXIUM 40 MG capsule Take 1 capsule (40 mg total) by mouth daily. (Patient not taking: Reported on 05/26/2018) 90 capsule 3   No facility-administered medications prior to visit.     Review of Systems;  Patient denies headache, fevers, malaise, unintentional weight loss, skin rash, eye pain, sinus congestion and sinus pain, sore throat, dysphagia,  hemoptysis , cough, dyspnea, wheezing, chest pain, palpitations, orthopnea, edema, abdominal pain, nausea, melena, diarrhea, constipation,  flank pain, dysuria, hematuria, urinary  Frequency, nocturia, numbness, tingling, seizures,  Focal weakness, Loss of consciousness,  Tremor, insomnia, depression, anxiety, and suicidal ideation.      Objective:  BP (!) 180/88 (BP Location: Left Arm, Patient Position: Sitting, Cuff Size: Normal)   Pulse 63   Temp 98.1 F (36.7 C) (Oral)   Resp 15   Ht 5\' 2"  (1.575 m)   Wt 148 lb (67.1 kg)   SpO2 99%   BMI 27.07 kg/m   BP Readings from Last 3 Encounters:  05/29/18 (!) 180/88  05/26/18 (!) 196/81  05/16/18 (!) 152/80    Wt Readings from Last 3 Encounters:  05/29/18 148 lb (67.1 kg)  05/26/18 149 lb 9.6  oz (67.9 kg)  05/16/18 148 lb 9.6 oz (67.4 kg)    General appearance: alert, cooperative and appears stated age Ears: normal TM's and external ear canals both ears Throat: lips, mucosa, and tongue normal; teeth and gums normal Neck: no adenopathy, no carotid bruit, supple, symmetrical, trachea midline and thyroid not enlarged, symmetric, no tenderness/mass/nodules Back: symmetric, no curvature. ROM normal. No CVA tenderness. Lungs: clear to auscultation bilaterally Heart: regular rate and rhythm, S1, S2 normal, no murmur, click, rub or gallop Abdomen: soft, non-tender; bowel sounds normal; no masses,  no organomegaly Pulses: 2+ and symmetric Skin: Skin color, texture, turgor normal. No rashes or lesions Lymph nodes: Cervical, supraclavicular, and axillary nodes normal.  Lab Results  Component Value Date   HGBA1C 5.9 07/19/2016   HGBA1C 5.8 06/02/2015    Lab Results  Component Value Date   CREATININE 1.10 02/22/2018   CREATININE 1.20 07/06/2017   CREATININE 1.18 (H) 02/07/2017    Lab Results  Component Value Date   WBC 5.1 05/24/2018   HGB 12.0 05/24/2018   HCT 35.8 05/24/2018   PLT 250 05/24/2018   GLUCOSE 87 02/22/2018   CHOL 167 02/22/2018   TRIG 53.0 02/22/2018   HDL 54.80 02/22/2018   LDLDIRECT 109.0 03/05/2016   LDLCALC 102 (H) 02/22/2018   ALT 11 02/22/2018   AST 18 02/22/2018   NA 136 02/22/2018   K 4.6 02/22/2018   CL 102 02/22/2018   CREATININE 1.10 02/22/2018   BUN 17 02/22/2018   CO2 28 02/22/2018   TSH 4.46 03/06/2018   INR 1.0 03/27/2014   HGBA1C 5.9 07/19/2016    Mm Screening Breast Tomo Bilateral  Result Date: 03/23/2018 CLINICAL DATA:  Screening. EXAM: DIGITAL SCREENING BILATERAL MAMMOGRAM WITH TOMO AND CAD COMPARISON:  Previous exam(s). ACR Breast Density Category c: The breast tissue is heterogeneously dense, which may obscure small masses. FINDINGS: There are no findings suspicious for malignancy. Images were processed with CAD. IMPRESSION:  No mammographic evidence of malignancy. A result letter of this screening mammogram will be mailed directly to the patient. RECOMMENDATION: Screening mammogram in one year. (Code:SM-B-01Y) BI-RADS CATEGORY  1: Negative. Electronically Signed   By: Ammie Ferrier M.D.   On: 03/23/2018 11:13    Assessment & Plan:   Problem List Items Addressed This Visit    Chronic narcotic use    Her pain has been managed with fentanyl thus far but she is requesting a change to percocet. 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  No changes today       Chronic epigastric pain    Slight improvement reported with change from nexium to protonix       Relevant Medications   oxyCODONE-acetaminophen (PERCOCET) 10-325 MG tablet      I have discontinued Tawanna A. Borrero's dicyclomine, NEXIUM, and ferrous sulfate. I am also having her maintain her aspirin, clopidogrel, ezetimibe, hydrALAZINE, chlordiazePOXIDE, baclofen, traMADol, labetalol, lisinopril, Omega 3, fentaNYL, amLODipine, pantoprazole, and oxyCODONE-acetaminophen.  Meds ordered this encounter  Medications  . oxyCODONE-acetaminophen (PERCOCET) 10-325 MG tablet    Sig: Take 1 tablet by mouth every 4 (four) hours as needed for pain.    Dispense:  42 tablet    Refill:  0    Patient transitioning from Fentanyl to percocet.  DO NOT FILL UNTIL FENTANYL REFILL IS DUE    Medications Discontinued During This Encounter  Medication Reason  . dicyclomine (BENTYL) 10 MG/5ML syrup Patient has not taken in last 30 days  . ferrous sulfate 325 (65 FE) MG EC tablet Patient has not taken in last 30 days  . NEXIUM 40 MG capsule Change in therapy  . oxyCODONE-acetaminophen  (PERCOCET) 10-325 MG tablet Reorder    Follow-up: No follow-ups on file.   Crecencio Mc, MD

## 2018-05-30 NOTE — Assessment & Plan Note (Signed)
Her pain has been managed with fentanyl thus far but she is requesting a change to percocet. rx written for one every 4 hours

## 2018-05-30 NOTE — Assessment & Plan Note (Signed)
Slight improvement reported with change from nexium to protonix

## 2018-05-30 NOTE — Assessment & Plan Note (Signed)
Home readings have been normal,  And attempts to increase her medications have resulted in hypotension and symptoms of dizziness which may suggest iatrogenic induction of a "low flow state".  No changes today

## 2018-06-04 ENCOUNTER — Emergency Department: Payer: Medicare Other

## 2018-06-04 ENCOUNTER — Other Ambulatory Visit: Payer: Self-pay

## 2018-06-04 ENCOUNTER — Emergency Department
Admission: EM | Admit: 2018-06-04 | Discharge: 2018-06-04 | Disposition: A | Discharge status: Home / Self Care | Payer: Medicare Other | Attending: Emergency Medicine | Admitting: Emergency Medicine

## 2018-06-04 DIAGNOSIS — Z87891 Personal history of nicotine dependence: Secondary | ICD-10-CM | POA: Insufficient documentation

## 2018-06-04 DIAGNOSIS — Z7901 Long term (current) use of anticoagulants: Secondary | ICD-10-CM | POA: Insufficient documentation

## 2018-06-04 DIAGNOSIS — R519 Headache, unspecified: Secondary | ICD-10-CM

## 2018-06-04 DIAGNOSIS — I2510 Atherosclerotic heart disease of native coronary artery without angina pectoris: Secondary | ICD-10-CM | POA: Diagnosis not present

## 2018-06-04 DIAGNOSIS — Z96643 Presence of artificial hip joint, bilateral: Secondary | ICD-10-CM | POA: Diagnosis not present

## 2018-06-04 DIAGNOSIS — I119 Hypertensive heart disease without heart failure: Secondary | ICD-10-CM | POA: Insufficient documentation

## 2018-06-04 DIAGNOSIS — I129 Hypertensive chronic kidney disease with stage 1 through stage 4 chronic kidney disease, or unspecified chronic kidney disease: Secondary | ICD-10-CM | POA: Insufficient documentation

## 2018-06-04 DIAGNOSIS — R51 Headache: Secondary | ICD-10-CM | POA: Insufficient documentation

## 2018-06-04 DIAGNOSIS — N183 Chronic kidney disease, stage 3 (moderate): Secondary | ICD-10-CM | POA: Insufficient documentation

## 2018-06-04 DIAGNOSIS — Z7982 Long term (current) use of aspirin: Secondary | ICD-10-CM | POA: Insufficient documentation

## 2018-06-04 DIAGNOSIS — Z79899 Other long term (current) drug therapy: Secondary | ICD-10-CM | POA: Insufficient documentation

## 2018-06-04 MED ORDER — FENTANYL CITRATE (PF) 100 MCG/2ML IJ SOLN
25.0000 ug | Freq: Once | INTRAMUSCULAR | Status: AC
  Administered 2018-06-04: 25 ug via INTRAVENOUS
  Filled 2018-06-04: qty 2

## 2018-06-04 MED ORDER — SODIUM CHLORIDE 0.9 % IV SOLN
1000.0000 mL | Freq: Once | INTRAVENOUS | Status: AC
  Administered 2018-06-04: 1000 mL via INTRAVENOUS

## 2018-06-04 MED ORDER — KETOROLAC TROMETHAMINE 30 MG/ML IJ SOLN
30.0000 mg | Freq: Once | INTRAMUSCULAR | Status: AC
  Administered 2018-06-04: 30 mg via INTRAVENOUS
  Filled 2018-06-04: qty 1

## 2018-06-04 MED ORDER — METOCLOPRAMIDE HCL 5 MG/ML IJ SOLN
20.0000 mg | Freq: Once | INTRAVENOUS | Status: AC
  Administered 2018-06-04: 20 mg via INTRAVENOUS
  Filled 2018-06-04: qty 4

## 2018-06-04 MED ORDER — DIPHENHYDRAMINE HCL 50 MG/ML IJ SOLN
25.0000 mg | Freq: Once | INTRAMUSCULAR | Status: AC
  Administered 2018-06-04: 25 mg via INTRAVENOUS
  Filled 2018-06-04: qty 1

## 2018-06-04 NOTE — ED Notes (Signed)
Pt requesting pain medication stating it is not her normal headache, and that the pain is also on the right upper gums and into the check and down into the neck on the right side

## 2018-06-04 NOTE — ED Provider Notes (Signed)
[facility] Emergency Department Provider Note   ____________________________________________    I have reviewed the triage vital signs and the nursing notes.   HISTORY  Chief Complaint Headache     HPI Sabrina [name] is a 77 y.o. female with a history of headaches presents today with complaints of severe headache.  Patient reports she was at church this morning and developed a throbbing headache on the right side of her head, typical of prior headaches although this feels more severe.  No neuro deficits.  No fevers or chills or neck pain.  No change in vision.  Typically she takes tramadol with significant improvement but not today.  Past Medical History:  Diagnosis Date  . Anemia of chronic kidney failure   . Anxiety   . CAD (coronary artery disease)   . Carotid artery stenosis   . Chronic kidney disease, stage III (moderate) (HCC)    Followed by Dr. Juleen China  . Hyperlipidemia   . Hypertension   . Renal artery stenosis ([place])   . Secondary hyperparathyroidism ([place])   . 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 Subclavian arterial stenosis [facility])     Patient Active Problem List   Diagnosis Date Noted  . Anemia 02/22/2018  . 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[place]) 08/04/2017  . Aortic aneurysm, abdominal ([place]) 07/05/2017  . Abdominal pain 04/12/2017  . Dizziness, psychogenic 02/13/2017  . Chronic breast pain 12/14/2016  . Atherosclerosis of renal artery ([place]) 09/09/2016  . Carotid stenosis 09/09/2016  . Palpitations 06/01/2016  . Dizziness 12/04/2015  . Cervical spondylosis without myelopathy 08/20/2015  . Headache 07/20/2015  . Statin intolerance 05/20/2015  . Lumbar radiculitis 12/24/2014  . Lumbar stenosis with neurogenic claudication 12/24/2014  . GERD (gastroesophageal reflux disease) 12/06/2014  . Chronic epigastric pain 11/14/2014  . Chronic right hip pain 11/14/2014  . Generalized anxiety disorder 09/08/2014  .  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 Peripheral vascular disease ([place]) 08/03/2014  . CAD (coronary artery disease) 08/03/2014  . HLD (hyperlipidemia) 06/24/2014  . Chronic narcotic use 06/24/2014  . Need for prophylactic vaccination and inoculation against influenza 06/24/2014  . Osteoarthritis of right hip 04/04/2014  . Need for vaccination with 13-polyvalent pneumococcal conjugate vaccine 12/28/2013  . Aortic valve disorder 12/28/2013  . Hip pain, bilateral 12/27/2013  . HTN (hypertension) 12/27/2013    Past Surgical History:  Procedure Laterality Date  . ABDOMINAL HYSTERECTOMY  1976  . CAROTID ARTERY ANGIOPLASTY Left   . CAROTID ENDARTERECTOMY Left   . CHOLECYSTECTOMY    . COLONOSCOPY WITH PROPOFOL N/A 08/16/2017   Procedure: COLONOSCOPY WITH PROPOFOL;  Surgeon: [name], MD;  Location: [facility] ENDOSCOPY;  Service: Endoscopy;  Laterality: N/A;  . CORONARY ANGIOPLASTY WITH STENT PLACEMENT  2000  . CORONARY ARTERY BYPASS GRAFT  2000  . CYSTOSCOPY WITH STENT PLACEMENT Bilateral   . ESOPHAGOGASTRODUODENOSCOPY (EGD) WITH PROPOFOL N/A 08/16/2017   Procedure: ESOPHAGOGASTRODUODENOSCOPY (EGD) WITH PROPOFOL;  Surgeon: [name], MD;  Location: [facility] ENDOSCOPY;  Service: Endoscopy;  Laterality: N/A;  . RENAL ARTERY ANGIOPLASTY Bilateral mARCH 2015  . TOE AMPUTATION Right    small toe  . TONSILLECTOMY AND ADENOIDECTOMY    . TOTAL HIP ARTHROPLASTY Left   . TOTAL HIP ARTHROPLASTY Right 2015    Prior to Admission medications   Medication Sig Start Date End Date Taking? Authorizing Provider  aspirin 81 MG tablet Take 81 mg by mouth daily.  Yes [provider]  chlordiazePOXIDE (LIBRIUM) 25 MG capsule TAKE ONE CAPSULE BY MOUTH ONCE DAILY AS NEEDED FOR ANXIETY 10/20/17  Yes Crecencio Mc, MD  ezetimibe (ZETIA) 10 MG tablet Take 1 tablet (10 mg total) by mouth daily. 09/30/17  Yes Crecencio Mc, MD    ferrous sulfate (FEROSUL) 325 (65 FE) MG tablet Take 325 mg by mouth daily with breakfast.   Yes [provider]  labetalol (NORMODYNE) 200 MG tablet TAKE ONE-HALF TABLET BY MOUTH THREE TIMES DAILY 01/05/18  Yes Crecencio Mc, MD  lisinopril (PRINIVIL,ZESTRIL) 20 MG tablet TAKE 1 TABLET BY MOUTH TWICE DAILY 02/03/18  Yes Crecencio Mc, MD  Omega 3 1000 MG CAPS Take 1 capsule by mouth 2 (two) times daily.    Yes [provider]  pantoprazole (PROTONIX) 40 MG tablet Take 1 tablet (40 mg total) by mouth daily. 05/16/18  Yes Crecencio Mc, MD  traMADol (ULTRAM) 50 MG tablet TAKE TWO TABLETS BY MOUTH EVERY 8 HOURS AS NEEDED 11/11/17  Yes Crecencio Mc, MD  amLODipine (NORVASC) 10 MG tablet TAKE 1 TABLET BY MOUTH ONCE DAILY Patient not taking: Reported on 05/26/2018 05/15/18   Crecencio Mc, MD  baclofen (LIORESAL) 10 MG tablet Take 2 tablets at bedtime and during the day as needed for muscle spasm 11/04/17   Crecencio Mc, MD  clopidogrel (PLAVIX) 75 MG tablet Take 1 tablet (75 mg total) daily by mouth. Patient not taking: Reported on 06/04/2018 09/09/17   Crecencio Mc, MD  fentaNYL (DURAGESIC) 75 MCG/HR Place 1 patch (75 mcg total) onto the skin every 3 (three) days. Patient not taking: Reported on 06/04/2018 04/20/18   Crecencio Mc, MD  hydrALAZINE (APRESOLINE) 50 MG tablet TAKE ONE TABLET BY MOUTH THREE TIMES DAILY AS NEEDED 10/11/17   Crecencio Mc, MD  oxyCODONE-acetaminophen (PERCOCET) 10-325 MG tablet Take 1 tablet by mouth every 4 (four) hours as needed for pain. Patient not taking: Reported on 06/04/2018 05/29/18   Crecencio Mc, MD     Allergies Citalopram; Dilaudid [hydromorphone hcl]; Hydrochlorothiazide; Nsaids; Nubain [nalbuphine hcl]; Penicillins; Prasugrel; and Statins  Family History  Problem Relation Age of Onset  . Stroke Mother   . Hypertension Mother   . Diabetes Mother   . Hypertension Father   . Heart disease Sister        MI  . Multiple  sclerosis Daughter   . Multiple sclerosis Son   . Cerebral aneurysm Son   . Seizures Son   . Cerebral aneurysm Son   . Breast cancer Paternal Aunt 97    Social History Social History   Tobacco Use  . Smoking status: Former Smoker    Last attempt to quit: 10/25/1998    Years since quitting: 19.6  . Smokeless tobacco: Never Used  Substance Use Topics  . Alcohol use: No    Alcohol/week: 0.0 standard drinks  . Drug use: No    Review of Systems  Constitutional: No fever/chills Eyes: No visual changes.  ENT: No sore throat. Cardiovascular: Denies chest pain. Respiratory: Denies shortness of breath. Gastrointestinal: No abdominal pain.  No nausea, no vomiting.   Genitourinary: Negative for dysuria. Musculoskeletal: Negative for back pain. Skin: Negative for rash. Neurological: As above   ____________________________________________   PHYSICAL EXAM:  VITAL SIGNS: ED Triage Vitals  Enc Vitals Group     BP 06/04/18 0909 (!) 182/85     Pulse Rate 06/04/18 0910 79  Resp 06/04/18 0909 (!) 9     Temp 06/04/18 0912 98.1 F (36.7 C)     Temp Source 06/04/18 0912 Oral     SpO2 06/04/18 0910 100 %     Weight 06/04/18 0913 67.1 kg (148 lb)     Height 06/04/18 0913 1.575 m (5\' 2" )     Head Circumference --      Peak Flow --      Pain Score 06/04/18 0913 9     Pain Loc --      Pain Edu? --      Excl. in [place]? --     Constitutional: Alert and oriented. No acute distress.  Eyes: Conjunctivae are normal.  Head: Atraumatic. Nose: No congestion/rhinnorhea. Mouth/Throat: Mucous membranes are moist.    Cardiovascular: Normal rate, regular rhythm. Grossly normal heart sounds.  Good peripheral circulation. Respiratory: Normal respiratory effort.  No retractions.  Gastrointestinal: Soft and nontender. No distention.   Musculoskeletal: No lower extremity tenderness nor edema.  Warm and well perfused Neurologic:  Normal speech and language. No gross focal neurologic deficits are  appreciated.  Skin:  Skin is warm, dry and intact. No rash noted. Psychiatric: Mood and affect are normal. Speech and behavior are normal.  ____________________________________________   LABS (all labs ordered are listed, but only abnormal results are displayed)  Labs Reviewed - No data to display ____________________________________________  EKG  None ____________________________________________  RADIOLOGY  CT head unremarked ____________________________________________   PROCEDURES  Procedure(s) performed: No  Procedures   Critical Care performed: No ____________________________________________   INITIAL IMPRESSION / ASSESSMENT AND PLAN / ED COURSE  Pertinent labs & imaging results that were available during my care of the patient were reviewed by me and considered in my medical decision making (see chart for details).  Patient with a history of intractable headaches presents today with severe headache consistent with typical headache patterns in the past however she does report it seems to be worse.  CT head is reassuring.  Neuro exam is unremarkable.  Will treat with Reglan, Benadryl, Toradol, IV fluids evaluate  Patient also given IV fentanyl and after the above medications felt much better and was anxious to leave.  Strict return precautions discussed    ____________________________________________   FINAL CLINICAL IMPRESSION(S) / ED DIAGNOSES  Final diagnoses:  Acute nonintractable headache, unspecified headache type        Note:  This document was prepared using Dragon voice recognition software and may include unintentional dictation errors.     Lavonia Drafts, MD 06/04/18 1440

## 2018-06-04 NOTE — ED Triage Notes (Signed)
Pt presents today via [facility] from home for HA. Pt has hx of migraines and her medication was changed int eh last week from Fentayl to Percocet. Pt also has a hx of HTN and presents to [facility] with a BP 214/100 HR 76.

## 2018-06-04 NOTE — ED Notes (Signed)
Pt states she took 2 tramadol as well as librium around 0830

## 2018-06-05 ENCOUNTER — Encounter: Payer: Self-pay | Admitting: Internal Medicine

## 2018-06-05 ENCOUNTER — Ambulatory Visit: Payer: Self-pay

## 2018-06-05 ENCOUNTER — Ambulatory Visit (INDEPENDENT_AMBULATORY_CARE_PROVIDER_SITE_OTHER): Payer: Medicare Other | Admitting: Internal Medicine

## 2018-06-05 DIAGNOSIS — I1310 Hypertensive heart and chronic kidney disease without heart failure, with stage 1 through stage 4 chronic kidney disease, or unspecified chronic kidney disease: Secondary | ICD-10-CM

## 2018-06-05 DIAGNOSIS — I701 Atherosclerosis of renal artery: Secondary | ICD-10-CM

## 2018-06-05 DIAGNOSIS — N183 Chronic kidney disease, stage 3 unspecified: Secondary | ICD-10-CM

## 2018-06-05 DIAGNOSIS — F1190 Opioid use, unspecified, uncomplicated: Secondary | ICD-10-CM | POA: Diagnosis not present

## 2018-06-05 MED ORDER — AMLODIPINE BESYLATE 10 MG PO TABS: 10.0000 mg | ORAL_TABLET | Freq: Every day | ORAL | 1 refills | Status: DC

## 2018-06-05 NOTE — Patient Instructions (Signed)
Take a full percocet now and repeat in 5 to 6 hours if needed  Take another hydralazine when you get home  50 mg  And a full labetalol dose   I have refilled the amlodipine starting tomorrow to use if the hydralazine does not work.

## 2018-06-05 NOTE — Assessment & Plan Note (Signed)
Her pain has been managed with fentanyl thus far but she is requesting a change to percocet. rx was written for one every 4 hours for 7 day trial and she has not fully transitioned to this dose despite stopping her fentanyl transdermal on Wedneday.  Advised to begin use of Percocet 10/325 every 4 to 6 hours starting today .

## 2018-06-05 NOTE — Telephone Encounter (Signed)
Patient called in with c/o "headache and high BP." She says "I was having a headache yesterday and my BP was high, 200 over something. EMS came and took me to the hospital. I was given medicine for my headache, but nothing for my BP. When I left it was 199 over something. My headache was gone. This morning I have a headache again, checked my BP and it was 216/103. I took my medications, Labetalol 1/2 200 mg, Lisinopril 20 mg, and Hydralazine 50 mg." I asked about other symptoms, she denies. According to protocol, go to ED, patient says "I don't want to go to the ED. They did nothing for me." I asked her to hold while I call to the office. I called the office and spoke to Patina, [facility] who agrees patient needs to go to the ED. I advised the patient and she says "I don't want to go. I am feeling better, no headache, and I know my BP is coming down. I will check it and if it is still up like it was, then I will go back." I asked the patient to check the BP while on the phone with me, she checked and it was now 198/97 with no symptoms." I asked her to hold, I called back to the office and spoke with Patina, FC about her current BP and no symptoms, she agreed for patient to come in at her previously scheduled appointment today at 1630 with Dr. Derrel Nip. Patient advised of the above conversation with Patina. Care advice given, patient verbalized understanding.  Reason for Disposition . Systolic BP  >= 008 OR Diastolic >= 676  Answer Assessment - Initial Assessment Questions 1. BLOOD PRESSURE: "What is the blood pressure?" "Did you take at least two measurements 5 minutes apart?"     216/103 2. ONSET: "When did you take your blood pressure?"     This morning 3. HOW: "How did you obtain the blood pressure?" (e.g., visiting nurse, automatic home BP monitor)     Automatic home BP monitor 4. HISTORY: "Do you have a history of high blood pressure?"     Yes 5. MEDICATIONS: "Are you taking any medications for blood  pressure?" "Have you missed any doses recently?"     Yes, no doses missed 6. OTHER SYMPTOMS: "Do you have any symptoms?" (e.g., headache, chest pain, blurred vision, difficulty breathing, weakness)     Headache 7. PREGNANCY: "Is there any chance you are pregnant?" "When was your last menstrual period?"     No  Protocols used: HIGH BLOOD PRESSURE-A-AH

## 2018-06-05 NOTE — Assessment & Plan Note (Signed)
Elevations likely due to uncontrolled pain since stopping fentanyl.  Patient was treated in ER August 11 for headache  But no meds for HTN were given.  Advised to take a 3rd dose of hydralazine  Now,  Increase labetalol to full tablet tonight and treat her pain with a full percocet.  Adding amlodipine in the morning if BP is still elevated

## 2018-06-05 NOTE — Progress Notes (Signed)
Subjective:  Patient ID: Lucillie A Bojanowski, female    DOB: 01-24-1941  Age: 77 y.o. 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  HPI Brooklen A Rothman presents for one week follow up on change in opioid medication from fentanyl patch to oxycodone.  She was last seen on August 5   Patient's last fentanyl patch was removed on  Took patch off on  Wednesday, August 7 .  Started oxycodone  Same day  At 1/2 tablet as needed.  Has used only 2 full tablets since Wednesday .   felt it was helping.tolerated medication .   She developed a severe headache yesterday while singing in church  On August 11.  EMS was called and she was taken to [facility].  Head CT was done and negative for acute changes.  She was treated with Toradol and IV fentanyl,   And sent home .  NO BP meds were given.     Headache  Returned today .  She took a full Percocet this morning around 10:00 .  She has noted elevated BP  Readings at home with systolic  > 854  ,  Took hydralazine 50 mg  At 10 am and 3:28 with slight improvement.   Also took her usual dose of lisinopril and labetalol this morning.   Outpatient Medications Prior to Visit  Medication Sig Dispense Refill  . aspirin 81 MG tablet Take 81 mg by mouth daily.    . baclofen (LIORESAL) 10 MG tablet Take 2 tablets at bedtime and during the day as needed for muscle spasm 120 tablet 5  . chlordiazePOXIDE (LIBRIUM) 25 MG capsule TAKE ONE CAPSULE BY MOUTH ONCE DAILY AS NEEDED FOR ANXIETY 30 capsule 5  . clopidogrel (PLAVIX) 75 MG tablet Take 1 tablet (75 mg total) daily by mouth. (Patient not taking: Reported on 06/04/2018) 90 tablet 2  . ezetimibe (ZETIA) 10 MG tablet Take 1 tablet (10 mg total) by mouth daily. 30 tablet 5  . fentaNYL (DURAGESIC) 75 MCG/HR Place 1 patch (75 mcg total) onto the skin every 3 (three) days. (Patient not  taking: Reported on 06/04/2018) 10 patch 0  . ferrous sulfate (FEROSUL) 325 (65 FE) MG tablet Take 325 mg by mouth daily with breakfast.    . hydrALAZINE (APRESOLINE) 50 MG tablet TAKE ONE TABLET BY MOUTH THREE TIMES DAILY AS NEEDED 270 tablet 0  . labetalol (NORMODYNE) 200 MG tablet TAKE ONE-HALF TABLET BY MOUTH THREE TIMES DAILY 135 tablet 1  . lisinopril (PRINIVIL,ZESTRIL) 20 MG tablet TAKE 1 TABLET BY MOUTH TWICE DAILY 180 tablet 0  . Omega 3 1000 MG CAPS Take 1 capsule by mouth 2 (two) times daily.     Marland Kitchen oxyCODONE-acetaminophen (PERCOCET) 10-325 MG tablet Take 1 tablet by mouth every 4 (four) hours as needed for pain. (Patient not taking: Reported on 06/04/2018) 42 tablet 0  . pantoprazole (PROTONIX) 40 MG tablet Take 1 tablet (40 mg total) by mouth daily. 30 tablet 3  . traMADol (ULTRAM) 50 MG tablet TAKE TWO TABLETS BY MOUTH EVERY 8 HOURS AS NEEDED 180 tablet 3  . amLODipine (NORVASC) 10 MG tablet TAKE 1 TABLET BY MOUTH ONCE DAILY (Patient not taking: Reported on 05/26/2018) 90 tablet 1   No facility-administered medications prior to visit.     Review of Systems;  Patient denies  fevers, malaise, unintentional weight loss, skin rash,  eye pain, sinus congestion and sinus pain, sore throat, dysphagia,  hemoptysis , cough, dyspnea, wheezing, chest pain, palpitations, orthopnea, edema, abdominal pain, nausea, melena, diarrhea, constipation, flank pain, dysuria, hematuria, urinary  Frequency, nocturia, numbness, tingling, seizures,  Focal weakness, Loss of consciousness,  Tremor, insomnia, depression, anxiety, and suicidal ideation.      Objective:  BP (!) 200/96 (BP Location: Right Arm, Patient Position: Sitting, Cuff Size: Normal)   Resp 15   Ht 5\' 2"  (1.575 m)   Wt 149 lb 12.8 oz (67.9 kg)   SpO2 100%   BMI 27.40 kg/m   BP Readings from Last 3 Encounters:  06/05/18 (!) 200/96  06/04/18 (!) 189/89  05/29/18 (!) 180/88    Wt Readings from Last 3 Encounters:  06/05/18 149 lb 12.8 oz  (67.9 kg)  06/04/18 148 lb (67.1 kg)  05/29/18 148 lb (67.1 kg)    General appearance: alert, cooperative and appears  In pain  Ears: normal TM's and external ear canals both ears Throat: lips, mucosa, and tongue normal; teeth and gums normal Neck: no adenopathy, no carotid bruit, supple, symmetrical, trachea midline and thyroid not enlarged, symmetric, no tenderness/mass/nodules Back: symmetric, no curvature. ROM normal. No CVA tenderness. Lungs: clear to auscultation bilaterally Heart: regular rate and rhythm, S1, S2 normal, no murmur, click, rub or gallop Abdomen: soft, non-tender; bowel sounds normal; no masses,  no organomegaly Pulses: 2+ and symmetric Skin: Skin color, texture, turgor normal. No rashes or lesions Lymph nodes: Cervical, supraclavicular, and axillary nodes normal. Neuro:  awake and interactive with normal mood and affect. Higher cortical functions are normal. Speech is clear without word-finding difficulty or dysarthria. Extraocular movements are intact. Visual fields of both eyes are grossly intact. Sensation to light touch is grossly intact bilaterally of upper and lower extremities. Motor examination shows 4+/5 symmetric hand grip and upper extremity and 5/5 lower extremity strength. There is no pronation or drift. Gait is non-ataxic    Lab Results  Component Value Date   HGBA1C 5.9 07/19/2016   HGBA1C 5.8 06/02/2015    Lab Results  Component Value Date   CREATININE 1.10 02/22/2018   CREATININE 1.20 07/06/2017   CREATININE 1.18 (H) 02/07/2017    Lab Results  Component Value Date   WBC 5.1 05/24/2018   HGB 12.0 05/24/2018   HCT 35.8 05/24/2018   PLT 250 05/24/2018   GLUCOSE 87 02/22/2018   CHOL 167 02/22/2018   TRIG 53.0 02/22/2018   HDL 54.80 02/22/2018   LDLDIRECT 109.0 03/05/2016   LDLCALC 102 (H) 02/22/2018   ALT 11 02/22/2018   AST 18 02/22/2018   NA 136 02/22/2018   K 4.6 02/22/2018   CL 102 02/22/2018   CREATININE 1.10 02/22/2018   BUN  17 02/22/2018   CO2 28 02/22/2018   TSH 4.46 03/06/2018   INR 1.0 03/27/2014   HGBA1C 5.9 07/19/2016    Ct Head Wo Contrast  Result Date: 06/04/2018 CLINICAL DATA:  Headache, hypertension EXAM: CT HEAD WITHOUT CONTRAST TECHNIQUE: Contiguous axial images were obtained from the base of the skull through the vertex without intravenous contrast. COMPARISON:  MRI brain dated 06/02/2012 FINDINGS: Brain: No evidence of acute infarction, hemorrhage, hydrocephalus, extra-axial collection or mass lesion/mass effect. Mild cortical atrophy. Mild subcortical white matter and periventricular small vessel ischemic changes. Vascular: Intracranial atherosclerosis. Skull: Normal. Negative for fracture or focal lesion. Sinuses/Orbits: The visualized paranasal sinuses are essentially clear. The mastoid air cells are unopacified. Other: None. IMPRESSION: No evidence of acute intracranial abnormality.  Mild atrophy with small vessel ischemic changes. Electronically Signed   By: Julian Hy M.D.   On: 06/04/2018 11:28    Assessment & Plan:   Problem List Items Addressed This Visit    Chronic narcotic use    Her pain has been managed with fentanyl thus far but she is requesting a change to percocet. rx was written for one every 4 hours for 7 day trial and she has not fully transitioned to this dose despite stopping her fentanyl transdermal on Wedneday.  Advised to begin use of Percocet 10/325 every 4 to 6 hours starting today .       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  Patient was treated in ER August 11 for headache  But no meds for HTN were given.  Advised to take a 3rd dose of hydralazine  Now,  Increase labetalol to full tablet tonight and treat her pain with a full percocet.  Adding amlodipine in the morning if BP is still elevated        Relevant Medications   amLODipine  (NORVASC) 10 MG tablet     A total of 25 minutes of face to face time was spent with patient more than half of which was spent in counselling about the above mentioned conditions  and coordination of care  I have changed Luci A. Mungin's amLODipine. I am also having her maintain her aspirin, clopidogrel, ezetimibe, hydrALAZINE, chlordiazePOXIDE, baclofen, traMADol, labetalol, lisinopril, Omega 3, fentaNYL, pantoprazole, oxyCODONE-acetaminophen, and ferrous sulfate.  Meds ordered this encounter  Medications  . amLODipine (NORVASC) 10 MG tablet    Sig: Take 1 tablet (10 mg total) by mouth daily.    Dispense:  90 tablet    Refill:  1    Medications Discontinued During This Encounter  Medication Reason  . amLODipine (NORVASC) 10 MG tablet Reorder    Follow-up: No follow-ups on file.   Crecencio Mc, MD

## 2018-06-05 NOTE — Telephone Encounter (Signed)
Patient did not want to go to the ED today. She stated that she would keep an eye on her BP but she wanted to see Dr. Derrel Nip, she was informed that since her BP was elevated that she needed to be evaluated immediately. Patient states that she is feeling a little better and will be at her appt. Patient was triaged by [facility] Nurse I communicated with the [facility] nurse and agreed that patient needed to go the ED. Please see phone encounter.

## 2018-06-06 ENCOUNTER — Other Ambulatory Visit: Payer: Self-pay | Admitting: Internal Medicine

## 2018-06-06 ENCOUNTER — Ambulatory Visit: Payer: Self-pay

## 2018-06-06 ENCOUNTER — Telehealth: Payer: Self-pay | Admitting: Internal Medicine

## 2018-06-06 DIAGNOSIS — R519 Headache, unspecified: Secondary | ICD-10-CM

## 2018-06-06 DIAGNOSIS — R42 Dizziness and giddiness: Secondary | ICD-10-CM

## 2018-06-06 DIAGNOSIS — R51 Headache: Principal | ICD-10-CM

## 2018-06-06 NOTE — Telephone Encounter (Signed)
Patient notified and voiced understanding , by writting and reading back directions.

## 2018-06-06 NOTE — Telephone Encounter (Unsigned)
Copied from [place] [id number]. Topic: Referral - Request >> Jun 06, 2018 10:07 AM Judyann Munson wrote: Reason for CRM:  Patient is calling to request a referral for a  neurology, she stated she would like to go to the same provider she was referred too previously. For the patients dizziness & Headaches

## 2018-06-06 NOTE — Telephone Encounter (Signed)
She should continue 10 amlodipine  Daily.   she should  double her labetalol dose to 400 mg twice daily and continue hydralazine 50 mg three times  daily    Keep appt on thrursday

## 2018-06-06 NOTE — Telephone Encounter (Signed)
Patient stated she feels better since taking the amlodipine had patient recheck BP again while on phone 197/101 pulse 78 at this time Patient took Amlodipine 10 mg at 12:50 PM, denies headache at this time and denies dizziness, earlier triage BP reported to be 212/108 at 1150 this morning.  Patient labetalol and a hydralazine at 0730 and at 1150 took second hydralazine then took amlodipine at 12:50.

## 2018-06-06 NOTE — Telephone Encounter (Signed)
Referral reqest

## 2018-06-06 NOTE — Telephone Encounter (Signed)
Patient called in with c/o "elevated BP." Patient called in yesterday with the same problem and was seen in the office by Dr. Derrel Nip. I read the notes of Dr. Derrel Nip. I asked the patient did she follow the instructions of Dr. Derrel Nip, she says "yes I did. The pharmacy did not have the amlodipine ready until today at 1400. Yesterday evening, I took the whole Labetalol that Dr. Derrel Nip said to take and I took the [place] before I left the office. This morning at 0314, I was feeling bad. I checked my BP and it was 178/90, so I took the Hydralazine. I got up and took all my medication at 0730. At 1123, my BP was 212/108 and I took another Hydralazine. Now my BP is 212/103 at 1155." I asked about other symptoms, she says "I have a very slight headache at a 4 on the pain scale and no other symptoms." I asked when was the last time she took a Percocet, she says "yesterday afternoon in the office." I advised her to take a Percocet and recheck her BP in 1 hour after taking to see if it is coming down as her headache decreases, she verbalized understanding. I asked the patient to hold while I call the pharmacy to check on her Amlodipine. I called [facility] and was told the Amlodipine is ready for pickup. I advised the patient to go ahead and pick up the Amlodipine, take it and recheck her BP 1 hour after taking the Amlodipine. I asked her to call us back with the BP reading if it is still not going down and further recommendation will be given at that time, patient verbalized understanding. She says she will call and find someone to go pick up her medication.   Reason for Disposition . [4] Systolic BP  >= 665 OR Diastolic >= 993 AND [5] missed most recent dose of blood pressure medication  Answer Assessment - Initial Assessment Questions 1. BLOOD PRESSURE: "What is the blood pressure?" "Did you take at least two measurements 5 minutes apart?"     212/103 2. ONSET: "When did you take your blood pressure?"     Just  now 3. HOW: "How did you obtain the blood pressure?" (e.g., visiting nurse, automatic home BP monitor)     Home BP monitor 4. HISTORY: "Do you have a history of high blood pressure?"     Yes 5. MEDICATIONS: "Are you taking any medications for blood pressure?" "Have you missed any doses recently?"     Yes, no missed doses 6. OTHER SYMPTOMS: "Do you have any symptoms?" (e.g., headache, chest pain, blurred vision, difficulty breathing, weakness)     Slight headache at a 4 on the pain scale, nothing else 7. PREGNANCY: "Is there any chance you are pregnant?" "When was your last menstrual period?"     N/A  Protocols used: HIGH BLOOD PRESSURE-A-AH

## 2018-06-06 NOTE — Telephone Encounter (Signed)
I called the patient back to check on her BP and how she's feeling. She says "I feel much better. I took the amlodipine." I asked her to check her BP. She says "it is 192/95 and I don't have a headache or anything. I feel better." I advised this will be sent to Dr. Derrel Nip for her review.

## 2018-06-06 NOTE — Telephone Encounter (Signed)
Pt is requesting a neurology referral for the dizziness and headaches that she is having. Pt stated that she would like to go to the same neurologist she saw before not sure who it was.

## 2018-06-07 NOTE — Telephone Encounter (Signed)
Patient called and was told about the referral being in process.

## 2018-06-07 NOTE — Telephone Encounter (Signed)
Referral to Dr Gurney Maxin at [facility] in process

## 2018-06-08 ENCOUNTER — Ambulatory Visit (INDEPENDENT_AMBULATORY_CARE_PROVIDER_SITE_OTHER): Payer: Medicare Other | Admitting: Internal Medicine

## 2018-06-08 ENCOUNTER — Encounter: Payer: Self-pay | Admitting: Internal Medicine

## 2018-06-08 DIAGNOSIS — R519 Headache, unspecified: Secondary | ICD-10-CM

## 2018-06-08 DIAGNOSIS — I10 Essential (primary) hypertension: Secondary | ICD-10-CM

## 2018-06-08 DIAGNOSIS — M25551 Pain in right hip: Secondary | ICD-10-CM | POA: Diagnosis not present

## 2018-06-08 DIAGNOSIS — G8929 Other chronic pain: Secondary | ICD-10-CM | POA: Diagnosis not present

## 2018-06-08 DIAGNOSIS — R51 Headache: Secondary | ICD-10-CM

## 2018-06-08 DIAGNOSIS — I701 Atherosclerosis of renal artery: Secondary | ICD-10-CM | POA: Diagnosis not present

## 2018-06-08 MED ORDER — OXYCODONE-ACETAMINOPHEN 10-325 MG PO TABS: 1.0000 | ORAL_TABLET | Freq: Three times a day (TID) | ORAL | 0 refills | Status: DC | PRN

## 2018-06-08 NOTE — Patient Instructions (Addendum)
Reduce the labetolol to one full tablet twice daily daily  (200  Mg twice daily )   Increase the dose of  lisinopril to 2 tablets twice daily  (40 mg tiwce daily)   continue amlodipine once daily   Use hydralazine if needed for SBP > 151 or higher   ,   You can use 1/2 percocet every 8 hours to manage fentanyl withdrawal symptoms  For the next week  Take a whole percocet if needed for a headache   I am refilling the percocet got august 22.  The instructions will read "one tablet every 8 hours for severe pain."    ONLY USE WHAT YOU NEED starting August 22    If the cough persists,  We will need to change the lisinopril

## 2018-06-08 NOTE — Progress Notes (Signed)
Subjective:  Patient ID: Sabrina [name], female    DOB: 1940/11/07  Age: 77 y.o. MRN: [id number]  CC: Diagnoses of Essential hypertension, Chronic right hip pain, and Acute nonintractable headache, unspecified headache type were pertinent to this visit.  HPI Sabrina [name] presents for follow up on uncontrolled hypertension , recurrent headache,  And chronic pain with transition from fentanyl  To percocet for better/safer pain control.  BP has improved with increased labetalol dose and resuming of  amlodipine .  Currently taking labetalol 400 mg bid  Amlodipine  10 mg daily ,m lisinopril 20 mg twice daily .  Hydralazine 50 mg tid. As needed .  Using lisinopril 20 mg twice daily   She states that her recurrent headache  Has resolved with the minimal use of percocet.  She currently denies pain, but continues to endorse nonspecific symptoms  of malaise.  "I  Just don't feel  Good." she denies nausea, but feels sleepy.  She  believes she is withdrawing from the fentanyl, however she has none of the classic symptoms of narcotics withdrawal and  Is using the percocet as needed,  Using 1/2 tablet per dose and averaging less than 2 daily . She reports a dry cough and sleepiness.     Outpatient Medications Prior to Visit  Medication Sig Dispense Refill  . amLODipine (NORVASC) 10 MG tablet Take 1 tablet (10 mg total) by mouth daily. 90 tablet 1  . aspirin 81 MG tablet Take 81 mg by mouth daily.    . baclofen (LIORESAL) 10 MG tablet Take 2 tablets at bedtime and during the day as needed for muscle spasm 120 tablet 5  . chlordiazePOXIDE (LIBRIUM) 25 MG capsule TAKE ONE CAPSULE BY MOUTH ONCE DAILY AS NEEDED FOR ANXIETY 30 capsule 5  . clopidogrel (PLAVIX) 75 MG tablet TAKE 1 TABLET BY MOUTH ONCE DAILY 90 tablet 1  . ezetimibe (ZETIA) 10 MG tablet Take 1 tablet (10 mg total) by mouth daily. 30 tablet 5  . ferrous sulfate (FEROSUL) 325 (65 FE) MG tablet Take 325 mg by mouth daily with breakfast.      . hydrALAZINE (APRESOLINE) 50 MG tablet TAKE ONE TABLET BY MOUTH THREE TIMES DAILY AS NEEDED 270 tablet 0  . lisinopril (PRINIVIL,ZESTRIL) 20 MG tablet TAKE 1 TABLET BY MOUTH TWICE DAILY 180 tablet 0  . Omega 3 1000 MG CAPS Take 1 capsule by mouth 2 (two) times daily.     Marland Kitchen oxyCODONE-acetaminophen (PERCOCET) 10-325 MG tablet Take 1 tablet by mouth every 4 (four) hours as needed for pain. 42 tablet 0  . pantoprazole (PROTONIX) 40 MG tablet Take 1 tablet (40 mg total) by mouth daily. 30 tablet 3  . traMADol (ULTRAM) 50 MG tablet TAKE TWO TABLETS BY MOUTH EVERY 8 HOURS AS NEEDED 180 tablet 3  . fentaNYL (DURAGESIC) 75 MCG/HR Place 1 patch (75 mcg total) onto the skin every 3 (three) days. 10 patch 0  . labetalol (NORMODYNE) 200 MG tablet TAKE ONE-HALF TABLET BY MOUTH THREE TIMES DAILY 135 tablet 1   No facility-administered medications prior to visit.     Review of Systems;  Patient denies headache, fevers, unintentional weight loss, skin rash, eye pain, sinus congestion and sinus pain, sore throat, dysphagia,  hemoptysis , cough, dyspnea, wheezing, chest pain, palpitations, orthopnea, edema, abdominal pain, nausea, melena, diarrhea, constipation, flank pain, dysuria, hematuria, urinary  Frequency, nocturia, numbness, tingling, seizures,  Focal weakness, Loss of consciousness,  Tremor, insomnia, depression, anxiety, and suicidal ideation.  Objective:  BP 126/68 (BP Location: Left Arm)   Pulse 72   Temp 98.3 F (36.8 C) (Oral)   Resp 15   Ht 5\' 2"  (1.575 m)   Wt 147 lb 6.4 oz (66.9 kg)   SpO2 98%   BMI 26.96 kg/m   BP Readings from Last 3 Encounters:  06/08/18 126/68  06/05/18 (!) 200/96  06/04/18 (!) 189/89    Wt Readings from Last 3 Encounters:  06/08/18 147 lb 6.4 oz (66.9 kg)  06/05/18 149 lb 12.8 oz (67.9 kg)  06/04/18 148 lb (67.1 kg)    General appearance: alert, cooperative and appears stated age Ears: normal TM's and external ear canals both ears Throat: lips,  mucosa, and tongue normal; teeth and gums normal Neck: no adenopathy, no carotid bruit, supple, symmetrical, trachea midline and thyroid not enlarged, symmetric, no tenderness/mass/nodules Back: symmetric, no curvature. ROM normal. No CVA tenderness. Lungs: clear to auscultation bilaterally Heart: regular rate and rhythm, S1, S2 normal, no murmur, click, rub or gallop Abdomen: soft, non-tender; bowel sounds normal; no masses,  no organomegaly Pulses: 2+ and symmetric Skin: Skin color, texture, turgor normal. No rashes or lesions Lymph nodes: Cervical, supraclavicular, and axillary nodes normal.  Lab Results  Component Value Date   HGBA1C 5.9 07/19/2016   HGBA1C 5.8 06/02/2015    Lab Results  Component Value Date   CREATININE 1.10 02/22/2018   CREATININE 1.20 07/06/2017   CREATININE 1.18 (H) 02/07/2017    Lab Results  Component Value Date   WBC 5.1 05/24/2018   HGB 12.0 05/24/2018   HCT 35.8 05/24/2018   PLT 250 05/24/2018   GLUCOSE 87 02/22/2018   CHOL 167 02/22/2018   TRIG 53.0 02/22/2018   HDL 54.80 02/22/2018   LDLDIRECT 109.0 03/05/2016   LDLCALC 102 (H) 02/22/2018   ALT 11 02/22/2018   AST 18 02/22/2018   NA 136 02/22/2018   K 4.6 02/22/2018   CL 102 02/22/2018   CREATININE 1.10 02/22/2018   BUN 17 02/22/2018   CO2 28 02/22/2018   TSH 4.46 03/06/2018   INR 1.0 03/27/2014   HGBA1C 5.9 07/19/2016    Ct Head Wo Contrast  Result Date: 06/04/2018 CLINICAL DATA:  Headache, hypertension EXAM: CT HEAD WITHOUT CONTRAST TECHNIQUE: Contiguous axial images were obtained from the base of the skull through the vertex without intravenous contrast. COMPARISON:  MRI brain dated 06/02/2012 FINDINGS: Brain: No evidence of acute infarction, hemorrhage, hydrocephalus, extra-axial collection or mass lesion/mass effect. Mild cortical atrophy. Mild subcortical white matter and periventricular small vessel ischemic changes. Vascular: Intracranial atherosclerosis. Skull: Normal. Negative  for fracture or focal lesion. Sinuses/Orbits: The visualized paranasal sinuses are essentially clear. The mastoid air cells are unopacified. Other: None. IMPRESSION: No evidence of acute intracranial abnormality. Mild atrophy with small vessel ischemic changes. Electronically Signed   By: Julian Hy M.D.   On: 06/04/2018 11:28    Assessment & Plan:   Problem List Items Addressed This Visit    HTN (hypertension)    Given her sleepiness on current regimen,  Will reduce labetalol from 400 mg bid t 200 mg bid and incresae lisiopril to 20 mg bid.  If cough persists,  Will change lisinopril to an ARB      Relevant Medications   labetalol (NORMODYNE) 200 MG tablet   Chronic right hip pain    Previously managed with fentanyl with incomplete relief.  She has been transitioned to Percocet and is using 1/2 talbet 3 times daily on average  Relevant Medications   oxyCODONE-acetaminophen (PERCOCET) 10-325 MG tablet (Start on 06/15/2018)   Headache    Recurrent,  multifactorial.  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  She has been  Ruled out for vasculitis in July 2019,  Has seen Neurosurgery with no treatment offered,  And did not tolerate pamelor prescribed by [name] last year.  She has been referred back to Neurology per her request.       Relevant Medications   oxyCODONE-acetaminophen (PERCOCET) 10-325 MG tablet (Start on 06/15/2018)   labetalol (NORMODYNE) 200 MG tablet    A total of 25 minutes of face to face time was spent with patient more than half of which was spent in counselling about the above mentioned conditions  and coordination of care   I have discontinued Sabrina [name]'s labetalol and fentaNYL. I have also changed her labetalol. Additionally, I am having her start on oxyCODONE-acetaminophen. Lastly, I am having her maintain her aspirin, ezetimibe, hydrALAZINE, chlordiazePOXIDE, baclofen, traMADol, lisinopril, Omega 3, pantoprazole,  oxyCODONE-acetaminophen, ferrous sulfate, amLODipine, and clopidogrel.  Meds ordered this encounter  Medications  . 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    Dispense:  90 tablet    Refill:  0  . DISCONTD: labetalol (NORMODYNE) 200 MG tablet    Sig: Take 2 tablets (400 mg total) by mouth 2 (two) times daily.    Dispense:  120 tablet    Refill:  1    NOTE DOSE CHANGE EFFECTIVE  AUGUST 16  . labetalol (NORMODYNE) 200 MG tablet    Sig: Take 1 tablet (200 mg total) by mouth 2 (two) times daily.    Dispense:  180 tablet    Refill:  1    NOTE DOSE CHANGE EFFECTIVE  AUGUST 16    Medications Discontinued During This Encounter  Medication Reason  . fentaNYL (DURAGESIC) 75 MCG/HR   . labetalol (NORMODYNE) 200 MG tablet   . labetalol (NORMODYNE) 200 MG tablet     Follow-up: Return in about 2 weeks (around 06/22/2018) for follow up  on pain ,  hypertension .   Crecencio Mc, MD

## 2018-06-09 ENCOUNTER — Other Ambulatory Visit: Payer: Self-pay | Admitting: Internal Medicine

## 2018-06-11 MED ORDER — LABETALOL HCL 200 MG PO TABS: 200.0000 mg | ORAL_TABLET | Freq: Two times a day (BID) | ORAL | 1 refills | Status: DC

## 2018-06-11 MED ORDER — LABETALOL HCL 200 MG PO TABS: 400.0000 mg | ORAL_TABLET | Freq: Two times a day (BID) | ORAL | 1 refills | Status: DC

## 2018-06-11 NOTE — Assessment & Plan Note (Signed)
Previously managed with fentanyl with incomplete relief.  She has been transitioned to Percocet and is using 1/2 talbet 3 times daily on average

## 2018-06-11 NOTE — Assessment & Plan Note (Signed)
Given her sleepiness on current regimen,  Will reduce labetalol from 400 mg bid t 200 mg bid and incresae lisiopril to 20 mg bid.  If cough persists,  Will change lisinopril to an ARB

## 2018-06-11 NOTE — Assessment & Plan Note (Signed)
Recurrent,  multifactorial.  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  She has been  Ruled out for vasculitis in July 2019,  Has seen Neurosurgery with no treatment offered,  And did not tolerate pamelor prescribed by [name] last year.  She has been referred back to Neurology per her request.

## 2018-06-12 ENCOUNTER — Telehealth: Payer: Self-pay

## 2018-06-12 ENCOUNTER — Telehealth: Payer: Self-pay | Admitting: Internal Medicine

## 2018-06-12 ENCOUNTER — Other Ambulatory Visit: Payer: Self-pay | Admitting: Internal Medicine

## 2018-06-12 NOTE — Telephone Encounter (Signed)
Patient came into office complaint of headache and BP elevate  X 7 days seen by PCP on 06/08/18 instructions given to reduce labetalol to 200 mg twice daily patient continued to take 400 mg twice daily, explained to patient new regimen from PCP visit and ok to take a whole percocet when pain was severe headache , patient said she thought the headache had to be unbearable to take whole percocet. Attained vitals and notified PCP was advised BY PCP to go over medication again with patient.  Which I advised patient of new regimen as written on patient DC summary from PCP.   BP 154/80  Pulse 60 RESP: 16 02 sats @ 99%   Patient voiced understanding, called patient to see if she had arrived home  But no answer.

## 2018-06-12 NOTE — Telephone Encounter (Signed)
Copied from [place] [id number]. Topic: Referral - Request >> Jun 06, 2018 10:07 AM Judyann Munson wrote: Reason for CRM:  Patient is calling to request a referral for a  neurology, she stated she would like to go to the same provider she was referred too previously. For the patients dizziness & Headaches >> Jun 12, 2018 10:06 AM Hewitt Shorts wrote: Pt is returning call to referrals about her neurology referral

## 2018-06-15 ENCOUNTER — Telehealth: Payer: Self-pay | Admitting: Internal Medicine

## 2018-06-15 DIAGNOSIS — G8929 Other chronic pain: Secondary | ICD-10-CM

## 2018-06-15 DIAGNOSIS — R1013 Epigastric pain: Principal | ICD-10-CM

## 2018-06-15 NOTE — Telephone Encounter (Signed)
Patient is requesting endoscopy some where in [place] because she is continuing to have stomach pain an the protonix is not working. Patient also stated that she has had a head ache since 8/12 and the 1/2 tab of percocet is not effective. She recently stopped the Fentanyl and was given Percocet 1/2 tab q 8 hours and 1 tab PRN. Patient says that the only way her head stops hurting is if she takes the PRN dose of the percocet.

## 2018-06-15 NOTE — Telephone Encounter (Signed)
Patient is aware 

## 2018-06-15 NOTE — Telephone Encounter (Signed)
She was given #90 percocet with her last prescription, so if she needs to use a fall tablet every 8 hours for the headache,  She can.  She was referred back to [name] for evaluation of the headache,  The GI referral is in progress

## 2018-06-15 NOTE — Telephone Encounter (Signed)
Copied from [place] [id number]. Topic: Quick Communication - See Telephone Encounter >> Jun 15, 2018 10:49 AM Burchel, Abbi R wrote: CRM for notification. See Telephone encounter for: 06/15/18.  Pt requesting a call back from [place] re: endoscopy scheduling (pt having stomach pain)   [phone]

## 2018-06-20 ENCOUNTER — Encounter: Payer: Self-pay | Admitting: Gastroenterology

## 2018-06-20 ENCOUNTER — Other Ambulatory Visit: Payer: Self-pay

## 2018-06-20 ENCOUNTER — Ambulatory Visit (INDEPENDENT_AMBULATORY_CARE_PROVIDER_SITE_OTHER): Payer: Medicare Other | Admitting: Gastroenterology

## 2018-06-20 VITALS — BP 138/70 | HR 73 | Ht 62.0 in | Wt 143.8 lb

## 2018-06-20 DIAGNOSIS — R1084 Generalized abdominal pain: Secondary | ICD-10-CM

## 2018-06-20 DIAGNOSIS — R748 Abnormal levels of other serum enzymes: Secondary | ICD-10-CM

## 2018-06-20 NOTE — Patient Instructions (Signed)
Miralax daily F/u 6 months  High-Fiber Diet Fiber, also called dietary fiber, is a type of carbohydrate found in fruits, vegetables, whole grains, and beans. A high-fiber diet can have many health benefits. Your health care provider may recommend a high-fiber diet to help:  Prevent constipation. Fiber can make your bowel movements more regular.  Lower your cholesterol.  Relieve hemorrhoids, uncomplicated diverticulosis, or irritable bowel syndrome.  Prevent overeating as part of a weight-loss plan.  Prevent heart disease, type 2 diabetes, and certain cancers.  What is my plan? The recommended daily intake of fiber includes:  38 grams for men under age 9.  72 grams for men over age 37.  50 grams for women under age 54.  10 grams for women over age 70.  You can get the recommended daily intake of dietary fiber by eating a variety of fruits, vegetables, grains, and beans. Your health care provider may also recommend a fiber supplement if it is not possible to get enough fiber through your diet. What do I need to know about a high-fiber diet?  Fiber supplements have not been widely studied for their effectiveness, so it is better to get fiber through food sources.  Always check the fiber content on thenutrition facts label of any prepackaged food. Look for foods that contain at least 5 grams of fiber per serving.  Ask your dietitian if you have questions about specific foods that are related to your condition, especially if those foods are not listed in the following section.  Increase your daily fiber consumption gradually. Increasing your intake of dietary fiber too quickly may cause bloating, cramping, or gas.  Drink plenty of water. Water helps you to digest fiber. What foods can I eat? Grains Whole-grain breads. Multigrain cereal. Oats and oatmeal. Brown rice. Barley. Bulgur wheat. Bush. Bran muffins. Popcorn. Rye wafer crackers. Vegetables Sweet potatoes. Spinach.  Kale. Artichokes. Cabbage. Broccoli. Green peas. Carrots. Squash. Fruits Berries. Pears. Apples. Oranges. Avocados. Prunes and raisins. Dried figs. Meats and Other Protein Sources Navy, kidney, pinto, and soy beans. Split peas. Lentils. Nuts and seeds. Dairy Fiber-fortified yogurt. Beverages Fiber-fortified soy milk. Fiber-fortified orange juice. Other Fiber bars. The items listed above may not be a complete list of recommended foods or beverages. Contact your dietitian for more options. What foods are not recommended? Grains White bread. Pasta made with refined flour. White rice. Vegetables Fried potatoes. Canned vegetables. Well-cooked vegetables. Fruits Fruit juice. Cooked, strained fruit. Meats and Other Protein Sources Fatty cuts of meat. Fried Sales executive or fried fish. Dairy Milk. Yogurt. Cream cheese. Sour cream. Beverages Soft drinks. Other Cakes and pastries. Butter and oils. The items listed above may not be a complete list of foods and beverages to avoid. Contact your dietitian for more information. What are some tips for including high-fiber foods in my diet?  Eat a wide variety of high-fiber foods.  Make sure that half of all grains consumed each day are whole grains.  Replace breads and cereals made from refined flour or white flour with whole-grain breads and cereals.  Replace white rice with brown rice, bulgur wheat, or millet.  Start the day with a breakfast that is high in fiber, such as a cereal that contains at least 5 grams of fiber per serving.  Use beans in place of meat in soups, salads, or pasta.  Eat high-fiber snacks, such as berries, raw vegetables, nuts, or popcorn. This information is not intended to replace advice given to you by your health care  provider. Make sure you discuss any questions you have with your health care provider. Document Released: 10/11/2005 Document Revised: 03/18/2016 Document Reviewed: 03/26/2014 Elsevier Interactive  Patient Education  [employer].

## 2018-06-21 ENCOUNTER — Telehealth: Payer: Self-pay | Admitting: *Deleted

## 2018-06-21 NOTE — Telephone Encounter (Signed)
Copied from [place] [id number]. Topic: Quick Communication - See Telephone Encounter >> Jun 21, 2018 10:20 AM Antonieta Iba C wrote: CRM for notification. See Telephone encounter for: 06/21/18.  Pt is requesting a call back from [place]: [phone]

## 2018-06-21 NOTE — Telephone Encounter (Signed)
Patient says her head is still hurting she took percocet at 9 am and her head is still hurting very bad, she wanted to take a tramadol to see if this would help advised patient that she should not take the tramadol until I here from PCP. Patient stared she would not. Pain rated at an 8 on 0-10 pain scale.

## 2018-06-21 NOTE — Telephone Encounter (Signed)
She can add a tramadol if it has been 2 hours since taking the percocet and no relief,  The other choice is her baclofen,  Which is a muscle relaxer , if she has not tried it.  I would try baclofen first if she has not taken one yet.

## 2018-06-21 NOTE — Telephone Encounter (Signed)
Notified patient and she stated she is going to try the baclofen first, Patient thanked doctor very much for listening.

## 2018-06-22 ENCOUNTER — Telehealth: Payer: Self-pay

## 2018-06-22 NOTE — Progress Notes (Signed)
Sabrina [name] [address]  [place]  [place],  [address]  Main: [phone]  Fax: [phone]   Gastroenterology Consultation  Referring Provider:     Crecencio Mc, MD Primary Care Physician:  Crecencio Mc, MD Primary Gastroenterologist:  Dr. Vonda [name] Reason for Consultation:     Abdominal pain        HPI:    Chief Complaint  Patient presents with  . New Patient (Initial Visit)    referred by Dr. Derrel Nip for chronic epigastric pain x2 months    Sabrina [name] is a 77 y.o. y/o female referred for consultation & management  by Dr. Crecencio Mc, MD.  Patient with history of chronic opioid use, referred for midepigastric abdominal pain, intermittent, ongoing for 6 months.  No nausea or vomiting.  No dysphagia.  Is on Protonix that she states does not help her pain.  States has been on Nexium, omeprazole, and other medications in the past and they do not help her symptoms.  Patient's opioids have been changed recently by her primary care provider from fentanyl to Percocet.  Denies any constipation.  States has soft problems daily, and bowel movements do not relieve her abdominal pain.  No weight loss.  Had a screening colonoscopy in October 2018 by Dr. Allen Norris, it was normal except for internal hemorrhoids.  Past Medical History:  Diagnosis Date  . Anemia of chronic kidney failure   . Anxiety   . CAD (coronary artery disease)   . Carotid artery stenosis   . Chronic kidney disease, stage III (moderate) (HCC)    Followed by Dr. Juleen China  . Hyperlipidemia   . Hypertension   . Renal artery stenosis ([place])   . Secondary hyperparathyroidism ([place])   . 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 Subclavian arterial stenosis [facility])     Past Surgical History:  Procedure Laterality Date  . ABDOMINAL HYSTERECTOMY  1976  . CAROTID ARTERY ANGIOPLASTY Left   . CAROTID ENDARTERECTOMY Left   . CHOLECYSTECTOMY    . COLONOSCOPY WITH PROPOFOL  N/A 08/16/2017   Procedure: COLONOSCOPY WITH PROPOFOL;  Surgeon: [name], MD;  Location: [facility] ENDOSCOPY;  Service: Endoscopy;  Laterality: N/A;  . CORONARY ANGIOPLASTY WITH STENT PLACEMENT  2000  . CORONARY ARTERY BYPASS GRAFT  2000  . CYSTOSCOPY WITH STENT PLACEMENT Bilateral   . ESOPHAGOGASTRODUODENOSCOPY (EGD) WITH PROPOFOL N/A 08/16/2017   Procedure: ESOPHAGOGASTRODUODENOSCOPY (EGD) WITH PROPOFOL;  Surgeon: [name], MD;  Location: [facility] ENDOSCOPY;  Service: Endoscopy;  Laterality: N/A;  . RENAL ARTERY ANGIOPLASTY Bilateral mARCH 2015  . TOE AMPUTATION Right    small toe  . TONSILLECTOMY AND ADENOIDECTOMY    . TOTAL HIP ARTHROPLASTY Left   . TOTAL HIP ARTHROPLASTY Right 2015    Prior to Admission medications   Medication Sig Start Date End Date Taking? Authorizing Provider  amLODipine (NORVASC) 10 MG tablet Take 1 tablet (10 mg total) by mouth daily. 06/05/18  Yes Crecencio Mc, MD  aspirin 81 MG tablet Take 81 mg by mouth daily.   Yes [provider]  baclofen (LIORESAL) 10 MG tablet Take 2 tablets at bedtime and during the day as needed for muscle spasm 11/04/17  Yes Crecencio Mc, MD  chlordiazePOXIDE (LIBRIUM) 25 MG capsule TAKE ONE CAPSULE BY MOUTH ONCE DAILY AS NEEDED FOR ANXIETY 10/20/17  Yes Crecencio Mc, MD  clopidogrel (PLAVIX) 75 MG tablet TAKE 1 TABLET BY MOUTH ONCE DAILY 06/07/18  Yes Crecencio Mc, MD  ezetimibe (ZETIA) 10 MG tablet Take 1 tablet (10 mg total) by mouth daily. 09/30/17  Yes Crecencio Mc, MD  ferrous sulfate (FEROSUL) 325 (65 FE) MG tablet Take 325 mg by mouth daily with breakfast.   Yes [provider]  hydrALAZINE (APRESOLINE) 50 MG tablet TAKE ONE TABLET BY MOUTH THREE TIMES DAILY AS NEEDED 10/11/17  Yes Crecencio Mc, MD  labetalol (NORMODYNE) 200 MG tablet Take 1 tablet (200 mg total) by mouth 2 (two) times daily. 06/11/18  Yes Crecencio Mc, MD  lisinopril (PRINIVIL,ZESTRIL) 40 MG tablet TAKE ONE TABLET BY MOUTH  ONCE DAILY 06/12/18  Yes Crecencio Mc, MD  Omega 3 1000 MG CAPS Take 1 capsule by mouth 2 (two) times daily.    Yes [provider]  oxyCODONE-acetaminophen (PERCOCET) 10-325 MG tablet Take 1 tablet by mouth every 4 (four) hours as needed for pain. 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 06/15/18  Yes Crecencio Mc, MD  pantoprazole (PROTONIX) 40 MG tablet Take 1 tablet (40 mg total) by mouth daily. 05/16/18  Yes Crecencio Mc, MD  traMADol (ULTRAM) 50 MG tablet TAKE TWO TABLETS BY MOUTH EVERY 8 HOURS AS NEEDED 11/11/17  Yes Crecencio Mc, MD    Family History  Problem Relation Age of Onset  . Stroke Mother   . Hypertension Mother   . Diabetes Mother   . Hypertension Father   . Heart disease Sister        MI  . Multiple sclerosis Daughter   . Multiple sclerosis Son   . Cerebral aneurysm Son   . Seizures Son   . Cerebral aneurysm Son   . Breast cancer Paternal Aunt 46     Social History   Tobacco Use  . Smoking status: Former Smoker    Last attempt to quit: 10/25/1998    Years since quitting: 19.6  . Smokeless tobacco: Never Used  Substance Use Topics  . Alcohol use: No    Alcohol/week: 0.0 standard drinks  . Drug use: No    Allergies as of 06/20/2018 - Review Complete 06/20/2018  Allergen Reaction Noted  . Citalopram  05/22/2015  . Dilaudid [hydromorphone hcl] Nausea And Vomiting 12/27/2013  . Hydrochlorothiazide Other (See Comments) 09/08/2014  . Nsaids  07/04/2014  . Nubain [nalbuphine hcl]  12/27/2013  . Penicillins Itching 12/27/2013  . Prasugrel Itching 01/31/2014  . Statins Itching 12/27/2013    Review of Systems:    All systems reviewed and negative except where noted in HPI.   Physical Exam:  BP 138/70   Pulse 73   Ht 5\' 2"  (1.575 m)   Wt 143 lb 12.8 oz (65.2 kg)   BMI 26.30 kg/m    No LMP recorded. Patient has had a hysterectomy. Psych:  Alert and cooperative. Normal mood and affect. General:   Alert,  Well-developed, well-nourished, pleasant and cooperative in NAD Head:  Normocephalic and atraumatic. Eyes:  Sclera clear, no icterus.   Conjunctiva pink. Ears:  Normal auditory acuity. Nose:  No deformity, discharge, or lesions. Mouth:  No deformity or lesions,oropharynx pink & moist. Neck:  Supple; no masses or thyromegaly. Lungs:  Respirations even and unlabored.  Clear throughout to auscultation.   No wheezes, crackles, or rhonchi. No acute distress. Heart:  Regular rate and rhythm; no  murmurs, clicks, rubs, or gallops. Abdomen:  Normal bowel sounds.  No bruits.  Soft, non-tender and non-distended without masses, hepatosplenomegaly or hernias noted.  No guarding or rebound tenderness.    Msk:  Symmetrical without gross deformities. Good, equal movement & strength bilaterally. Pulses:  Normal pulses noted. Extremities:  No clubbing or edema.  No cyanosis. Neurologic:  Alert and oriented x3;  grossly normal neurologically. Skin:  Intact without significant lesions or rashes. No jaundice. Lymph Nodes:  No significant cervical adenopathy. Psych:  Alert and cooperative. Normal mood and affect.   Labs: CBC    Component Value Date/Time   WBC 5.1 05/24/2018 1521   RBC 4.13 05/24/2018 1521   HGB 12.0 05/24/2018 1521   HGB 9.2 (L) 04/13/2014 0952   HCT 35.8 05/24/2018 1521   HCT 24.7 (L) 04/12/2014 1143   PLT 250 05/24/2018 1521   PLT 182 04/11/2014 0558   MCV 86.8 05/24/2018 1521   MCV 85 03/27/2014 0944   MCH 29.2 05/24/2018 1521   MCHC 33.6 05/24/2018 1521   RDW 13.8 05/24/2018 1521   RDW 13.7 03/27/2014 0944   LYMPHSABS 1.4 05/24/2018 1521   MONOABS 0.5 05/24/2018 1521   EOSABS 0.4 05/24/2018 1521   BASOSABS 0.0 05/24/2018 1521   CMP     Component Value Date/Time   NA 136 02/22/2018 0835   NA 139 09/13/2014   NA 134 (L) 04/12/2014 0558   K 4.6  02/22/2018 0835   K 4.4 04/12/2014 0558   CL 102 02/22/2018 0835   CL 101 04/12/2014 0558   CO2 28 02/22/2018 0835   CO2 27 04/12/2014 0558   GLUCOSE 87 02/22/2018 0835   GLUCOSE 90 04/12/2014 0558   BUN 17 02/22/2018 0835   BUN 18 09/13/2014   BUN 16 04/12/2014 0558   CREATININE 1.10 02/22/2018 0835   CREATININE 1.12 04/12/2014 0558   CALCIUM 9.3 02/22/2018 0835   CALCIUM 8.5 04/12/2014 0558   PROT 6.9 03/06/2018 1357   PROT 8.2 01/30/2012 0118   ALBUMIN 3.9 02/22/2018 0835   ALBUMIN 3.9 01/30/2012 0118   AST 18 02/22/2018 0835   AST 31 01/30/2012 0118   ALT 11 02/22/2018 0835   ALT 22 01/30/2012 0118   ALKPHOS 61 02/22/2018 0835   ALKPHOS 79 01/30/2012 0118   BILITOT 0.6 02/22/2018 0835   BILITOT 0.5 01/30/2012 0118   GFRNONAA 44 (L) 02/07/2017 1627   GFRNONAA 49 (L) 04/12/2014 0558   GFRAA 51 (L) 02/07/2017 1627   GFRAA 57 (L) 04/12/2014 0558    Imaging Studies: Ct Head Wo Contrast  Result Date: 06/04/2018 CLINICAL DATA:  Headache, hypertension EXAM: CT HEAD WITHOUT CONTRAST TECHNIQUE: Contiguous axial images were obtained from the base of the skull through the vertex without intravenous contrast. COMPARISON:  MRI brain dated 06/02/2012 FINDINGS: Brain: No evidence of acute infarction, hemorrhage, hydrocephalus, extra-axial collection or mass lesion/mass effect. Mild cortical atrophy. Mild subcortical white matter and periventricular small vessel ischemic changes. Vascular: Intracranial atherosclerosis. Skull: Normal. Negative for fracture or focal lesion. Sinuses/Orbits: The visualized paranasal sinuses are essentially clear. The mastoid air cells are unopacified. Other: None. IMPRESSION: No evidence of acute intracranial abnormality. Mild atrophy with small vessel ischemic changes. Electronically Signed   By: Julian Hy M.D.   On: 06/04/2018 11:28    Assessment and Plan:   Sabrina [name] is a 77 y.o. y/o female has been referred for midepigastric abdominal  pain  Possible dyspepsia Possible GERD PPI do not control her pain She  specifically states she has been tried on multiple PPIs, including twice a day and that does not relieve her symptoms Some of her symptoms may be related to change in her opioids recently She is worried about ulcers in her stomach, and would like evaluation Can proceed with EGD for evaluation Patient was told that it may be normal and she verbalized understanding We will obtain H. pylori biopsies during the EGD I have discussed alternative options, risks & benefits,  which include, but are not limited to, bleeding, infection, perforation,respiratory complication & drug reaction.  The patient agrees with this plan & written consent will be obtained.    Patient educated extensively on acid reflux lifestyle modification, including buying a bed wedge, not eating 3 hrs before bedtime, diet modifications, and handout given for the same.   Patient asked to avoid NSAIDs  No indication for colonoscopy at this time  Patient is not willing to increase PPI at this time to see if it relieves her symptoms She is not willing to change Protonix to another PPI either  After EGD, can discuss this further if needed  Will need clearance for holding Plavix prior to the procedure   Dr Sabrina [name]

## 2018-06-22 NOTE — Telephone Encounter (Signed)
Pt notified that plavix may be stopped 5 days prior to procedure (8/31) and starting back on Plavix depends on if biopsies are done, if so, 3 days after (9/9).  Also pt questions about her ASA and I informed her that she can continue taking this. She wanted to ask Dr. Bonna Gains if she could use Chikaseed (otc) instead of Miralax. She does not care for the Miralax. Please advise.

## 2018-06-27 ENCOUNTER — Telehealth: Payer: Self-pay

## 2018-06-27 DIAGNOSIS — M5030 Other cervical disc degeneration, unspecified cervical region: Secondary | ICD-10-CM

## 2018-06-27 DIAGNOSIS — M4722 Other spondylosis with radiculopathy, cervical region: Secondary | ICD-10-CM

## 2018-06-27 NOTE — Telephone Encounter (Signed)
Spoke with pt and she stated that she has had a HA since her last appt on 06/08/2018. She stated that she has been taking the percocet 1 tablet every 8 hours and the tramadol 4 hours after that and it has not helped with the pain at all. The pt stated that she can not get in with the Neurologist until 07/23/2018 and states that she can not wait that long. Pt also stated that she feels like she is still having some issues with withdrawal symptoms. She stated that she feels like a "dog". Pt stated that at her last appt on 06/08/2018 she was told to take 1/2 tablet daily for withdrawal symptoms for one week and then to take one whole tablet as needed for severe HA. Pt has completed the 1/2 for withdrawal symptoms and is taking the one tablet every 8 hours hour severe HA pain. The pt wanting to know if she can go back to taking the 1/2 tablet daily for the withdrawal symptoms.

## 2018-06-27 NOTE — Telephone Encounter (Signed)
Copied from [place] [id number]. Topic: General - Other >> Jun 27, 2018  8:23 AM Ivar Drape wrote: Reason for CRM:   Patient would like a call back from Dr. Lupita Dawn assist about her medications.

## 2018-06-27 NOTE — Addendum Note (Signed)
Addended by: Crecencio Mc on: 06/27/2018 01:20 PM   Modules accepted: Orders

## 2018-06-27 NOTE — Telephone Encounter (Signed)
No.  She is not having withdrawal symptoms at this point, It is physically impossible since she has been off the fentanyl for nearly 2 weeks if she is taking percocet every 8 hours , she can   add 1/2 percocet in addition to the 1 she is taking every 8 hours.    At this point I am recommending that we go   to have the MRI of the cervical spine repeated first  I am making the referral .

## 2018-06-28 ENCOUNTER — Ambulatory Visit (INDEPENDENT_AMBULATORY_CARE_PROVIDER_SITE_OTHER): Payer: Medicare Other | Admitting: Internal Medicine

## 2018-06-28 ENCOUNTER — Encounter: Payer: Self-pay | Admitting: Internal Medicine

## 2018-06-28 DIAGNOSIS — I701 Atherosclerosis of renal artery: Secondary | ICD-10-CM | POA: Diagnosis not present

## 2018-06-28 DIAGNOSIS — G44041 Chronic paroxysmal hemicrania, intractable: Secondary | ICD-10-CM

## 2018-06-28 MED ORDER — OXYCODONE-ACETAMINOPHEN 10-325 MG PO TABS: 1.0000 | ORAL_TABLET | ORAL | 0 refills | Status: DC | PRN

## 2018-06-28 MED ORDER — FENTANYL 50 MCG/HR TD PT72: 50.0000 ug | MEDICATED_PATCH | TRANSDERMAL | 0 refills | Status: DC

## 2018-06-28 NOTE — Progress Notes (Signed)
Subjective:  Patient ID: Sabrina [name], female    DOB: [birth year]  Age: 77 y.o. 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  HPI Sabrina [name] presents for evaluation and treatment of persistent headache. She has a history of chronic headaches that began prior to 2016 with prior neurology evaluation by [name] in 2017nb .  No records are available for review but per patient she did not tolerate prophylactic treatment trial with Nortriptyline due to ineffectiveness and side effects.  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  She reports that she has had a persistent t headache  Has been present since August 15  With no improvement despite increased use of oxycodone/apaP 10/325 one tablet every 8 hours.   The pain is localized to her right temple and crown but radiates down her posterior neck.  It wakes her up at night and is not affected at all by transition from fentanyl transdermal patches to Oxycodone.  She has had recent brain imaging during an ER visit for severe headache accompanied by hypertensive crisis  that occurred during church services on August 11.  She has had decreased control of blood pressure for the last 2 weeks as well, despite adherence to medication regimen.  Prior workup for vasculitis was negative .     She has chronic generalized abdominal pain , which has not improved with trials of all PPIs that are affordable to her  Including twice daily prevacid.  An EGD is scheduled for Sept 5.  CTA of abd and pelvis  In October 2018 was done to rule out SMA occlusion given her known history of RAS  And PVD with bilateral renal , common and external iliac stenting . 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   All abdominal organs were WNL on CTA   She appears miserable.  SHe notes that NSAIDs improve her pain ,  But she uses them rarely due to CKD and hypertension.  We discussed returning to use of transdermal fentanyl  Since her quality of life has diminished considerable since making the transition to oxycodone/APAP. She also notes that  The most recent generic oxycodone/APAP has made her more nauseated than the previous one and has requested a new rx for the previous formulation made by a specific manufacturer.  She presents a nearly full bottle of the medication for proof that she has not used it.     Outpatient Medications Prior to Visit  Medication Sig Dispense Refill  . amLODipine (NORVASC) 10 MG tablet Take 1 tablet (10 mg total) by mouth daily. 90 tablet 1  . aspirin 81 MG tablet Take 81 mg by mouth daily.    . baclofen (LIORESAL) 10 MG tablet Take 2 tablets at bedtime and during the day as needed for muscle spasm 120 tablet 5  . chlordiazePOXIDE (LIBRIUM) 25 MG capsule TAKE ONE CAPSULE BY MOUTH ONCE DAILY AS NEEDED FOR ANXIETY 30 capsule 5  . clopidogrel (PLAVIX) 75 MG tablet TAKE 1 TABLET BY MOUTH ONCE DAILY 90 tablet 1  . ezetimibe (ZETIA) 10 MG tablet Take 1 tablet (10 mg total) by mouth daily. 30 tablet 5  . ferrous sulfate (FEROSUL) 325 (65 FE) MG tablet Take  325 mg by mouth daily with breakfast.    . hydrALAZINE (APRESOLINE) 50 MG tablet TAKE ONE TABLET BY MOUTH THREE TIMES DAILY AS NEEDED 270 tablet 0  . labetalol (NORMODYNE) 200 MG tablet Take 1 tablet (200 mg total) by mouth 2 (two) times daily. 180 tablet 1  . lisinopril (PRINIVIL,ZESTRIL) 40 MG tablet TAKE ONE TABLET BY MOUTH ONCE DAILY 90 tablet 1  . Omega 3 1000 MG CAPS Take 1 capsule by mouth 2 (two) times daily.     . pantoprazole (PROTONIX) 40 MG tablet Take 1 tablet (40 mg total) by mouth daily. 30 tablet 3  . traMADol (ULTRAM) 50 MG tablet TAKE TWO TABLETS BY MOUTH EVERY 8 HOURS AS NEEDED 180 tablet 3  .  oxyCODONE-acetaminophen (PERCOCET) 10-325 MG tablet Take 1 tablet by mouth every 4 (four) hours as needed for pain. 42 tablet 0  . oxyCODONE-acetaminophen (PERCOCET) 10-325 MG tablet Take 1 tablet by mouth every 8 (eight) hours as needed for pain (chronic neck pain from cervical spinal stenosis). 90 tablet 0   No facility-administered medications prior to visit.     Review of Systems;  Patient denies , fevers, malaise, unintentional weight loss, skin rash, eye pain, sinus congestion and sinus pain, sore throat, dysphagia,  hemoptysis , cough, dyspnea, wheezing, chest pain, palpitations, orthopnea, edema, , melena, diarrhea, constipation, flank pain, dysuria, hematuria, urinary  Frequency, nocturia, numbness, tingling, seizures,  Focal weakness, Loss of consciousness,  Tremor, insomnia, depression, anxiety, and suicidal ideation.      Objective:  BP (!) 156/60   Pulse 70   Temp 98.1 F (36.7 C) (Oral)   Ht 5\' 2"  (1.575 m)   Wt 145 lb 12.8 oz (66.1 kg)   SpO2 98%   BMI 26.67 kg/m   BP Readings from Last 3 Encounters:  06/28/18 (!) 156/60  06/20/18 138/70  06/08/18 126/68    Wt Readings from Last 3 Encounters:  06/28/18 145 lb 12.8 oz (66.1 kg)  06/20/18 143 lb 12.8 oz (65.2 kg)  06/08/18 147 lb 6.4 oz (66.9 kg)    General appearance: alert, cooperative and appears stated age.  appears to be in chronic pain .  Ears: normal TM's and external ear canals both ears Throat: lips, mucosa, and tongue normal; teeth and gums normal Neck: no adenopathy, no carotid bruit, supple, symmetrical, trachea midline and thyroid not enlarged, symmetric, no tenderness/mass/nodules Back: symmetric, no curvature. ROM restricted due to DDD. No CVA tenderness. Lungs: clear to auscultation bilaterally Heart: regular rate and rhythm, S1, S2 normal, no murmur, click, rub or gallop Abdomen: soft, non-tender; bowel sounds normal; no masses,  no organomegaly Pulses: 2+ and symmetric Skin: Skin color,  texture, turgor normal. No rashes or lesions Lymph nodes: Cervical, supraclavicular, and axillary nodes normal. Neuro:  awake and interactive with normal mood and affect. Higher cortical functions are normal. Speech is clear without word-finding difficulty or dysarthria. Extraocular movements are intact. Visual fields of both eyes are grossly intact. Sensation to light touch is grossly intact bilaterally of upper and lower extremities. Motor examination shows 4+/5 symmetric hand grip and upper extremity and 5/5 lower extremity strength. There is no pronation or drift. Gait is non-ataxic     Lab Results  Component Value Date   HGBA1C 5.9 07/19/2016   HGBA1C 5.8 06/02/2015    Lab Results  Component Value Date   CREATININE 1.10 02/22/2018   CREATININE 1.20 07/06/2017   CREATININE 1.18 (H) 02/07/2017    Lab Results  Component Value  Date   WBC 5.1 05/24/2018   HGB 12.0 05/24/2018   HCT 35.8 05/24/2018   PLT 250 05/24/2018   GLUCOSE 87 02/22/2018   CHOL 167 02/22/2018   TRIG 53.0 02/22/2018   HDL 54.80 02/22/2018   LDLDIRECT 109.0 03/05/2016   LDLCALC 102 (H) 02/22/2018   ALT 11 02/22/2018   AST 18 02/22/2018   NA 136 02/22/2018   K 4.6 02/22/2018   CL 102 02/22/2018   CREATININE 1.10 02/22/2018   BUN 17 02/22/2018   CO2 28 02/22/2018   TSH 4.46 03/06/2018   INR 1.0 03/27/2014   HGBA1C 5.9 07/19/2016    Ct Head Wo Contrast  Result Date: 06/04/2018 CLINICAL DATA:  Headache, hypertension EXAM: CT HEAD WITHOUT CONTRAST TECHNIQUE: Contiguous axial images were obtained from the base of the skull through the vertex without intravenous contrast. COMPARISON:  MRI brain dated 06/02/2012 FINDINGS: Brain: No evidence of acute infarction, hemorrhage, hydrocephalus, extra-axial collection or mass lesion/mass effect. Mild cortical atrophy. Mild subcortical white matter and periventricular small vessel ischemic changes. Vascular: Intracranial atherosclerosis. Skull: Normal. Negative for  fracture or focal lesion. Sinuses/Orbits: The visualized paranasal sinuses are essentially clear. The mastoid air cells are unopacified. Other: None. IMPRESSION: No evidence of acute intracranial abnormality. Mild atrophy with small vessel ischemic changes. Electronically Signed   By: Julian Hy M.D.   On: 06/04/2018 11:28    Assessment & Plan:   Problem List Items Addressed This Visit    Headache    I suspect that the etiology is multifactorial.  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  Cervical spine MRI has been reordered, with plans to refer for [facility] vs Neurosurgery pending the results.  Certainly use of opioids is not optimal,  But given her diffuse vascular disease, abdominal pain  and uncontrolled hypertension, and CKD ,  NSAIDS are not an option.  Appreciate future neurology evaluation .  Her headache was better controlled on fentanyl, so I am transitioning her back to fentanyl transdermal which was stopped less than once month ago.  Her previous dose was 75 mcg,  Will start with the 50 mcg dose and allow her to supple,ent with oxycodone/apap and tylenol until her pain control is adeuate. She has been insructed NOT to place the patch until tomorrow evening, to avoid interaction with anesthesia during the EGD tomorrow morning       Relevant Medications   oxyCODONE-acetaminophen (PERCOCET) 10-325 MG tablet   fentaNYL (DURAGESIC - DOSED MCG/HR) 50 MCG/HR     A total of 40 minutes was spent with patient more than half of which was spent in counseling patient on the above mentioned issues , reviewing and explaining recent referrals,  imaging studies done, and coordination of care.  I have discontinued Jaylani A. Blackshire's oxyCODONE-acetaminophen and oxyCODONE-acetaminophen. I am also having her start on oxyCODONE-acetaminophen. Additionally, I am having her maintain her aspirin, ezetimibe, hydrALAZINE, chlordiazePOXIDE, baclofen, traMADol, Omega 3,  pantoprazole, ferrous sulfate, amLODipine, clopidogrel, lisinopril, labetalol, and fentaNYL.  Meds ordered this encounter  Medications  . oxyCODONE-acetaminophen (PERCOCET) 10-325 MG tablet    Sig: Take 1 tablet by mouth every 4 (four) hours as needed for pain.    Dispense:  90 tablet    Refill:  0    Please fill using the [employer]  [employer]ufacturer  . fentaNYL (DURAGESIC - DOSED MCG/HR) 50 MCG/HR    Sig: Place 1 patch (50 mcg total) onto the skin every 3 (three) days.  Dispense:  10 patch    Refill:  0    Medications Discontinued During This Encounter  Medication Reason  . oxyCODONE-acetaminophen (PERCOCET) 10-325 MG tablet   . oxyCODONE-acetaminophen (PERCOCET) 10-325 MG tablet     Follow-up: No follow-ups on file.   Crecencio Mc, MD

## 2018-06-28 NOTE — Progress Notes (Signed)
Pre visit review using our clinic review tool, if applicable. No additional management support is needed unless otherwise documented below in the visit note. 

## 2018-06-28 NOTE — Telephone Encounter (Signed)
LMTCB. Please transfer pt to our office.  

## 2018-06-28 NOTE — Patient Instructions (Addendum)
Do not start the fentanyl patch until tomorrow evening.  If you need to use oxycodone , take 1/2 of 1 tablet every 6 hours  Until Day 2. Of the patch (Friday evening) .  By day 2, (Friday evening)  You should feel the full effect of the patch .  On day 2,  Try adding tylenol first,  Up to 2000 mg daily in divided doses (500 mg every 6 hours or 650 mg every 8 hours )   rather than oxycodone.    After Day 2 only resort to using oxycodone if tylenol does not alleviate the pain  And always use 1/2 tablet    Do not take amlodipine  if systolic is under 580    Keep the labetalol on board bc it may help with the headache

## 2018-06-29 ENCOUNTER — Ambulatory Visit: Payer: Medicare Other | Admitting: Certified Registered Nurse Anesthetist

## 2018-06-29 ENCOUNTER — Encounter: Payer: Self-pay | Admitting: *Deleted

## 2018-06-29 ENCOUNTER — Encounter
Admission: RE | Disposition: A | Discharge status: Home / Self Care | Payer: Self-pay | Source: Ambulatory Visit | Attending: Gastroenterology

## 2018-06-29 ENCOUNTER — Ambulatory Visit
Admission: RE | Admit: 2018-06-29 | Discharge: 2018-06-29 | Disposition: A | Discharge status: Home / Self Care | Payer: Medicare Other | Source: Ambulatory Visit | Attending: Gastroenterology | Admitting: Gastroenterology

## 2018-06-29 DIAGNOSIS — L83 Acanthosis nigricans: Secondary | ICD-10-CM | POA: Insufficient documentation

## 2018-06-29 DIAGNOSIS — Z888 Allergy status to other drugs, medicaments and biological substances status: Secondary | ICD-10-CM | POA: Insufficient documentation

## 2018-06-29 DIAGNOSIS — Z955 Presence of coronary angioplasty implant and graft: Secondary | ICD-10-CM | POA: Diagnosis not present

## 2018-06-29 DIAGNOSIS — K3189 Other diseases of stomach and duodenum: Secondary | ICD-10-CM | POA: Diagnosis not present

## 2018-06-29 DIAGNOSIS — Z96643 Presence of artificial hip joint, bilateral: Secondary | ICD-10-CM | POA: Diagnosis not present

## 2018-06-29 DIAGNOSIS — E785 Hyperlipidemia, unspecified: Secondary | ICD-10-CM | POA: Insufficient documentation

## 2018-06-29 DIAGNOSIS — Z885 Allergy status to narcotic agent status: Secondary | ICD-10-CM | POA: Insufficient documentation

## 2018-06-29 DIAGNOSIS — Z7982 Long term (current) use of aspirin: Secondary | ICD-10-CM | POA: Insufficient documentation

## 2018-06-29 DIAGNOSIS — N183 Chronic kidney disease, stage 3 (moderate): Secondary | ICD-10-CM | POA: Insufficient documentation

## 2018-06-29 DIAGNOSIS — Z880 Allergy status to penicillin: Secondary | ICD-10-CM | POA: Diagnosis not present

## 2018-06-29 DIAGNOSIS — I708 Atherosclerosis of other arteries: Secondary | ICD-10-CM | POA: Insufficient documentation

## 2018-06-29 DIAGNOSIS — Z79899 Other long term (current) drug therapy: Secondary | ICD-10-CM | POA: Insufficient documentation

## 2018-06-29 DIAGNOSIS — Z8249 Family history of ischemic heart disease and other diseases of the circulatory system: Secondary | ICD-10-CM | POA: Insufficient documentation

## 2018-06-29 DIAGNOSIS — I252 Old myocardial infarction: Secondary | ICD-10-CM | POA: Diagnosis not present

## 2018-06-29 DIAGNOSIS — Z7902 Long term (current) use of antithrombotics/antiplatelets: Secondary | ICD-10-CM | POA: Diagnosis not present

## 2018-06-29 DIAGNOSIS — I129 Hypertensive chronic kidney disease with stage 1 through stage 4 chronic kidney disease, or unspecified chronic kidney disease: Secondary | ICD-10-CM | POA: Diagnosis not present

## 2018-06-29 DIAGNOSIS — R109 Unspecified abdominal pain: Secondary | ICD-10-CM | POA: Diagnosis not present

## 2018-06-29 DIAGNOSIS — K219 Gastro-esophageal reflux disease without esophagitis: Secondary | ICD-10-CM | POA: Insufficient documentation

## 2018-06-29 DIAGNOSIS — K2950 Unspecified chronic gastritis without bleeding: Secondary | ICD-10-CM | POA: Diagnosis not present

## 2018-06-29 DIAGNOSIS — I2510 Atherosclerotic heart disease of native coronary artery without angina pectoris: Secondary | ICD-10-CM | POA: Diagnosis not present

## 2018-06-29 DIAGNOSIS — Z886 Allergy status to analgesic agent status: Secondary | ICD-10-CM | POA: Diagnosis not present

## 2018-06-29 DIAGNOSIS — N2581 Secondary hyperparathyroidism of renal origin: Secondary | ICD-10-CM | POA: Diagnosis not present

## 2018-06-29 DIAGNOSIS — F419 Anxiety disorder, unspecified: Secondary | ICD-10-CM | POA: Insufficient documentation

## 2018-06-29 DIAGNOSIS — Z87891 Personal history of nicotine dependence: Secondary | ICD-10-CM | POA: Diagnosis not present

## 2018-06-29 DIAGNOSIS — R1084 Generalized abdominal pain: Secondary | ICD-10-CM

## 2018-06-29 DIAGNOSIS — R1013 Epigastric pain: Secondary | ICD-10-CM | POA: Diagnosis present

## 2018-06-29 HISTORY — PX: ESOPHAGOGASTRODUODENOSCOPY (EGD) WITH PROPOFOL: SHX5813

## 2018-06-29 SURGERY — ESOPHAGOGASTRODUODENOSCOPY (EGD) WITH PROPOFOL
Anesthesia: General

## 2018-06-29 MED ORDER — PROPOFOL 500 MG/50ML IV EMUL
INTRAVENOUS | Status: DC | PRN
  Administered 2018-06-29: 100 ug/kg/min via INTRAVENOUS

## 2018-06-29 MED ORDER — LIDOCAINE HCL (CARDIAC) PF 100 MG/5ML IV SOSY
PREFILLED_SYRINGE | INTRAVENOUS | Status: DC | PRN
  Administered 2018-06-29: 50 mg via INTRAVENOUS

## 2018-06-29 MED ORDER — HYDRALAZINE HCL 20 MG/ML IJ SOLN
INTRAMUSCULAR | Status: AC
  Administered 2018-06-29: 10 mg via INTRAVENOUS
  Filled 2018-06-29: qty 1

## 2018-06-29 MED ORDER — PROPOFOL 10 MG/ML IV BOLUS
INTRAVENOUS | Status: AC
  Filled 2018-06-29: qty 20

## 2018-06-29 MED ORDER — HYDRALAZINE HCL 20 MG/ML IJ SOLN
10.0000 mg | Freq: Once | INTRAMUSCULAR | Status: AC
  Administered 2018-06-29: 10 mg via INTRAVENOUS

## 2018-06-29 MED ORDER — PROPOFOL 500 MG/50ML IV EMUL
INTRAVENOUS | Status: AC
  Filled 2018-06-29: qty 50

## 2018-06-29 MED ORDER — PROPOFOL 10 MG/ML IV BOLUS
INTRAVENOUS | Status: DC | PRN
  Administered 2018-06-29: 40 mg via INTRAVENOUS
  Administered 2018-06-29: 20 mg via INTRAVENOUS
  Administered 2018-06-29: 10 mg via INTRAVENOUS
  Administered 2018-06-29: 20 mg via INTRAVENOUS

## 2018-06-29 MED ORDER — LIDOCAINE HCL (PF) 1 % IJ SOLN
INTRAMUSCULAR | Status: AC
  Filled 2018-06-29: qty 2

## 2018-06-29 MED ORDER — SODIUM CHLORIDE 0.9 % IV SOLN
INTRAVENOUS | Status: DC
  Administered 2018-06-29: 10:00:00 via INTRAVENOUS
  Administered 2018-06-29: 1000 mL via INTRAVENOUS

## 2018-06-29 NOTE — Anesthesia Postprocedure Evaluation (Signed)
Anesthesia Post Note  Patient: Sabrina [name]  Procedure(s) Performed: ESOPHAGOGASTRODUODENOSCOPY (EGD) WITH PROPOFOL (N/A )  Patient location during evaluation: Endoscopy Anesthesia Type: General Level of consciousness: awake and alert Pain management: pain level controlled Vital Signs Assessment: post-procedure vital signs reviewed and stable Respiratory status: spontaneous breathing and respiratory function stable Cardiovascular status: stable Anesthetic complications: no     Last Vitals:  Vitals:   06/29/18 0758 06/29/18 1015  BP: (!) 202/86 (!) 135/45  Pulse: 69 78  Resp: 18 18  Temp: (!) 35.9 C (!) 36.1 C  SpO2: 100% 100%    Last Pain:  Vitals:   06/29/18 1015  TempSrc: Tympanic  PainSc: 0-No pain                 KEPHART,WILLIAM K

## 2018-06-29 NOTE — Op Note (Signed)
[facility] Patient Name: Sabrina [name] Procedure Date: 06/29/2018 9:38 AM MRN: [id number] Account #: [id number] Date of Birth: 13-Sep-1941 Admit Type: Outpatient Age: 77 Room: [facility] ENDO ROOM 3 Gender: Female Note Status: Finalized Procedure:            Upper GI endoscopy Indications:          Epigastric abdominal pain, Suspected gastro-esophageal                        reflux disease Providers:            Emit Kuenzel B. Bonna Gains MD, MD Referring MD:         Deborra Medina, MD (Referring MD) Medicines:            Monitored Anesthesia Care Complications:        No immediate complications. Procedure:            Pre-Anesthesia Assessment:                       - Prior to the procedure, a History and Physical was                        performed, and patient medications, allergies and                        sensitivities were reviewed. The patient's tolerance of                        previous anesthesia was reviewed.                       - The risks and benefits of the procedure and the                        sedation options and risks were discussed with the                        patient. All questions were answered and informed                        consent was obtained.                       - Patient identification and proposed procedure were                        verified prior to the procedure by the physician, the                        nurse, the anesthesiologist, the anesthetist and the                        technician. The procedure was verified in the procedure                        room.                       - ASA Grade Assessment: II - A patient with mild  systemic disease.                       After obtaining informed consent, the endoscope was                        passed under direct vision. Throughout the procedure,                        the patient's blood pressure, pulse, and oxygen      saturations were monitored continuously. The Endoscope                        was introduced through the mouth, and advanced to the                        second part of duodenum. The upper GI endoscopy was                        accomplished with ease. The patient tolerated the                        procedure well. Findings:      The examined esophagus was normal. Biopsies were obtained from the       proximal and distal esophagus with cold forceps for histology of       suspected eosinophilic esophagitis.      Patchy mildly erythematous mucosa without bleeding was found in the       gastric antrum. Biopsies were taken with a cold forceps for histology.       Biopsies were obtained in the gastric body, at the incisura and in the       gastric antrum with cold forceps for histology.      The duodenal bulb, second portion of the duodenum and examined duodenum       were normal. Impression:           - Normal esophagus. Biopsied.                       - Erythematous mucosa in the antrum. Biopsied.                       - Normal duodenal bulb, second portion of the duodenum                        and examined duodenum.                       - Biopsies were obtained in the gastric body, at the                        incisura and in the gastric antrum. Recommendation:       - Await pathology results.                       - Discharge patient to home (with escort).                       - Advance diet as tolerated.                       -  Continue present medications.                       - Patient has a contact number available for                        emergencies. The signs and symptoms of potential                        delayed complications were discussed with the patient.                        Return to normal activities tomorrow. Written discharge                        instructions were provided to the patient.                       - Discharge patient to home (with escort).                        - The findings and recommendations were discussed with                        the patient.                       - The findings and recommendations were discussed with                        the patient's family. Procedure Code(s):    --- Professional ---                       [phone], Esophagogastroduodenoscopy, flexible, transoral;                        with biopsy, single or multiple Diagnosis Code(s):    --- Professional ---                       K31.89, Other diseases of stomach and duodenum                       R10.13, Epigastric pain CPT copyright 2017 American Medical Association. All rights reserved. The codes documented in this report are preliminary and upon coder review may  be revised to meet current compliance requirements.  [name], MD Margretta Sidle B. Bonna Gains MD, MD 06/29/2018 10:12:10 AM This report has been signed electronically. Number of Addenda: 0 Note Initiated On: 06/29/2018 9:38 AM Estimated Blood Loss: Estimated blood loss: none.      [facility]

## 2018-06-29 NOTE — Telephone Encounter (Signed)
Pt was in the office yesterday and was given this message.

## 2018-06-29 NOTE — Assessment & Plan Note (Signed)
I suspect that the etiology is multifactorial.  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  Cervical spine MRI has been reordered, with plans to refer for [facility] vs Neurosurgery pending the results.  Certainly use of opioids is not optimal,  But given her diffuse vascular disease, abdominal pain  and uncontrolled hypertension, and CKD ,  NSAIDS are not an option.  Appreciate future neurology evaluation .  Her headache was better controlled on fentanyl, so I am transitioning her back to fentanyl transdermal which was stopped less than once month ago.  Her previous dose was 75 mcg,  Will start with the 50 mcg dose and allow her to supple,ent with oxycodone/apap and tylenol until her pain control is adeuate. She has been insructed NOT to place the patch until tomorrow evening, to avoid interaction with anesthesia during the EGD tomorrow morning

## 2018-06-29 NOTE — Transfer of Care (Signed)
Immediate Anesthesia Transfer of Care Note  Patient: Sabrina [name]  Procedure(s) Performed: ESOPHAGOGASTRODUODENOSCOPY (EGD) WITH PROPOFOL (N/A )  Patient Location: PACU  Anesthesia Type:MAC  Level of Consciousness: awake, alert  and oriented  Airway & Oxygen Therapy: Patient Spontanous Breathing and Patient connected to nasal cannula oxygen  Post-op Assessment: Report given to RN and Post -op Vital signs reviewed and stable  Post vital signs: stable  Last Vitals:  Vitals Value Taken Time  BP    Temp    Pulse    Resp    SpO2      Last Pain:  Vitals:   06/29/18 0758  TempSrc: Tympanic  PainSc: 0-No pain         Complications: No apparent anesthesia complications

## 2018-06-29 NOTE — H&P (Signed)
[name], MD [address], [place], [place], [place], [address] [address], [place], [place], [place], [address] Phone: [phone]  Fax: [phone]  Primary Care Physician:  Crecencio Mc, MD   Pre-Procedure History & Physical: HPI:  Sabrina [name] is a 77 y.o. female is here for an EGD.   Past Medical History:  Diagnosis Date  . Anemia of chronic kidney failure   . Anxiety   . CAD (coronary artery disease)   . Carotid artery stenosis   . Chronic kidney disease, stage III (moderate) (HCC)    Followed by Dr. Juleen China  . Hyperlipidemia   . Hypertension   . Renal artery stenosis ([place])   . Secondary hyperparathyroidism ([place])   . 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 Subclavian arterial stenosis [facility])     Past Surgical History:  Procedure Laterality Date  . ABDOMINAL HYSTERECTOMY  1976  . CAROTID ARTERY ANGIOPLASTY Left   . CAROTID ENDARTERECTOMY Left   . CHOLECYSTECTOMY    . COLONOSCOPY WITH PROPOFOL N/A 08/16/2017   Procedure: COLONOSCOPY WITH PROPOFOL;  Surgeon: [name], MD;  Location: [facility] ENDOSCOPY;  Service: Endoscopy;  Laterality: N/A;  . CORONARY ANGIOPLASTY WITH STENT PLACEMENT  2000  . CORONARY ARTERY BYPASS GRAFT  2000  . CYSTOSCOPY WITH STENT PLACEMENT Bilateral   . ESOPHAGOGASTRODUODENOSCOPY (EGD) WITH PROPOFOL N/A 08/16/2017   Procedure: ESOPHAGOGASTRODUODENOSCOPY (EGD) WITH PROPOFOL;  Surgeon: [name], MD;  Location: [facility] ENDOSCOPY;  Service: Endoscopy;  Laterality: N/A;  . JOINT REPLACEMENT Bilateral 2000  . RENAL ARTERY ANGIOPLASTY Bilateral mARCH 2015  . TOE AMPUTATION Right    small toe  . TONSILLECTOMY AND ADENOIDECTOMY    . TOTAL HIP ARTHROPLASTY Left   . TOTAL HIP ARTHROPLASTY Right 2015    Prior to Admission medications   Medication Sig Start Date End Date Taking? Authorizing Provider  aspirin 81 MG tablet Take 81 mg by mouth daily.   Yes [provider]  baclofen (LIORESAL) 10 MG  tablet Take 2 tablets at bedtime and during the day as needed for muscle spasm 11/04/17  Yes Crecencio Mc, MD  chlordiazePOXIDE (LIBRIUM) 25 MG capsule TAKE ONE CAPSULE BY MOUTH ONCE DAILY AS NEEDED FOR ANXIETY 10/20/17  Yes Crecencio Mc, MD  clopidogrel (PLAVIX) 75 MG tablet TAKE 1 TABLET BY MOUTH ONCE DAILY 06/07/18  Yes Crecencio Mc, MD  ezetimibe (ZETIA) 10 MG tablet Take 1 tablet (10 mg total) by mouth daily. 09/30/17  Yes Crecencio Mc, MD  fentaNYL (DURAGESIC - DOSED MCG/HR) 50 MCG/HR Place 1 patch (50 mcg total) onto the skin every 3 (three) days. 06/28/18  Yes Crecencio Mc, MD  hydrALAZINE (APRESOLINE) 50 MG tablet TAKE ONE TABLET BY MOUTH THREE TIMES DAILY AS NEEDED 10/11/17  Yes Crecencio Mc, MD  labetalol (NORMODYNE) 200 MG tablet Take 1 tablet (200 mg total) by mouth 2 (two) times daily. 06/11/18  Yes Crecencio Mc, MD  lisinopril (PRINIVIL,ZESTRIL) 40 MG tablet TAKE ONE TABLET BY MOUTH ONCE DAILY 06/12/18  Yes Crecencio Mc, MD  Omega 3 1000 MG CAPS Take 1 capsule by mouth 2 (two) times daily.    Yes [provider]  pantoprazole (PROTONIX) 40 MG tablet Take 1 tablet (40 mg total) by mouth daily. 05/16/18  Yes Crecencio Mc, MD  amLODipine (NORVASC) 10 MG tablet Take 1 tablet (10 mg total) by mouth daily. Patient not taking: Reported on 06/29/2018 06/05/18   Deborra Medina  L, MD  ferrous sulfate (FEROSUL) 325 (65 FE) MG tablet Take 325 mg by mouth daily with breakfast.    [provider]  oxyCODONE-acetaminophen (PERCOCET) 10-325 MG tablet Take 1 tablet by mouth every 4 (four) hours as needed for pain. 06/28/18   Crecencio Mc, MD  traMADol (ULTRAM) 50 MG tablet TAKE TWO TABLETS BY MOUTH EVERY 8 HOURS AS NEEDED 11/11/17   Crecencio Mc, MD    Allergies as of 06/21/2018 - Review Complete 06/20/2018  Allergen Reaction Noted  . Citalopram  05/22/2015  . Dilaudid [hydromorphone hcl] Nausea And Vomiting 12/27/2013  . Hydrochlorothiazide Other (See Comments)  09/08/2014  . Nsaids  07/04/2014  . Nubain [nalbuphine hcl]  12/27/2013  . Penicillins Itching 12/27/2013  . Prasugrel Itching 01/31/2014  . Statins Itching 12/27/2013    Family History  Problem Relation Age of Onset  . Stroke Mother   . Hypertension Mother   . Diabetes Mother   . Hypertension Father   . Heart disease Sister        MI  . Multiple sclerosis Daughter   . Multiple sclerosis Son   . Cerebral aneurysm Son   . Seizures Son   . Cerebral aneurysm Son   . Breast cancer Paternal Aunt 29    Social History   Socioeconomic History  . Marital status: Divorced    Spouse name: Not on file  . Number of children: Not on file  . Years of education: Not on file  . Highest education level: Not on file  Occupational History  . Occupation: retired  Scientific laboratory technician  . Financial resource strain: Not hard at all  . Food insecurity:    Worry: Never true    Inability: Never true  . Transportation needs:    Medical: No    Non-medical: No  Tobacco Use  . Smoking status: Former Smoker    Last attempt to quit: 10/25/1998    Years since quitting: 19.6  . Smokeless tobacco: Never Used  Substance and Sexual Activity  . Alcohol use: No    Alcohol/week: 0.0 standard drinks  . Drug use: Never  . Sexual activity: Not Currently  Lifestyle  . Physical activity:    Days per week: 0 days    Minutes per session: 0 min  . Stress: Patient refused  Relationships  . Social connections:    Talks on phone: Patient refused    Gets together: Patient refused    Attends religious service: Patient refused    Active member of club or organization: Patient refused    Attends meetings of clubs or organizations: Patient refused    Relationship status: Patient refused  . Intimate partner violence:    Fear of current or ex partner: No    Emotionally abused: No    Physically abused: No    Forced sexual activity: No  Other Topics Concern  . Not on file  Social History Narrative  . Not on file      Review of Systems: See HPI, otherwise negative ROS  Physical Exam: BP (!) 202/86   Pulse 69   Temp (!) 96.6 F (35.9 C) (Tympanic)   Resp 18   Ht 5\' 2"  (1.575 m)   Wt 67.6 kg   SpO2 100%   BMI 27.25 kg/m  General:   Alert,  pleasant and cooperative in NAD Head:  Normocephalic and atraumatic. Neck:  Supple; no masses or thyromegaly. Lungs:  Clear throughout to auscultation, normal respiratory effort.  Heart:  +S1, +S2, Regular rate and rhythm, No edema. Abdomen:  Soft, nontender and nondistended. Normal bowel sounds, without guarding, and without rebound.   Neurologic:  Alert and  oriented x4;  grossly normal neurologically.  Impression/Plan: Sabrina [name] is here for an EGD for abdominal pain Risks, benefits, limitations, and alternatives regarding the procedure have been reviewed with the patient.  Questions have been answered.  All parties agreeable.   Virgel Manifold, MD  06/29/2018, 9:46 AM

## 2018-06-29 NOTE — Addendum Note (Signed)
Addendum  created 06/29/18 1025 by Gunnar Fusi, MD   Review and Sign - Ready for Procedure

## 2018-06-29 NOTE — Anesthesia Preprocedure Evaluation (Signed)
Anesthesia Evaluation  Patient identified by MRN, date of birth, ID band Patient awake    Reviewed: Allergy & Precautions, NPO status , Patient's Chart, lab work & pertinent test results  History of Anesthesia Complications Negative for: history of anesthetic complications  Airway Mallampati: II       Dental  (+) Upper Dentures, Lower Dentures   Pulmonary neg sleep apnea, neg COPD, former smoker,           Cardiovascular hypertension, Pt. on medications + Past MI and + Cardiac Stents  (-) CHF (-) dysrhythmias (-) Valvular Problems/Murmurs     Neuro/Psych neg Seizures Anxiety    GI/Hepatic Neg liver ROS, GERD  Medicated,  Endo/Other  neg diabetes  Renal/GU Renal InsufficiencyRenal disease (Renal artery stenosis)     Musculoskeletal   Abdominal   Peds  Hematology  (+) anemia ,   Anesthesia Other Findings   Reproductive/Obstetrics                            Anesthesia Physical Anesthesia Plan  ASA: III  Anesthesia Plan: General   Post-op Pain Management:    Induction: Intravenous  PONV Risk Score and Plan: 3 and TIVA, Propofol infusion and Midazolam  Airway Management Planned: Nasal Cannula  Additional Equipment:   Intra-op Plan:   Post-operative Plan:   Informed Consent: I have reviewed the patients History and Physical, chart, labs and discussed the procedure including the risks, benefits and alternatives for the proposed anesthesia with the patient or authorized representative who has indicated his/her understanding and acceptance.     Plan Discussed with:   Anesthesia Plan Comments:         Anesthesia Quick Evaluation

## 2018-06-29 NOTE — Anesthesia Post-op Follow-up Note (Signed)
Anesthesia QCDR form completed.        

## 2018-06-30 ENCOUNTER — Encounter: Payer: Self-pay | Admitting: Gastroenterology

## 2018-06-30 LAB — SURGICAL PATHOLOGY

## 2018-07-02 ENCOUNTER — Telehealth: Payer: Self-pay | Admitting: Internal Medicine

## 2018-07-02 DIAGNOSIS — I10 Essential (primary) hypertension: Secondary | ICD-10-CM

## 2018-07-03 ENCOUNTER — Other Ambulatory Visit: Payer: Self-pay

## 2018-07-03 ENCOUNTER — Encounter: Payer: Self-pay | Admitting: Gastroenterology

## 2018-07-03 DIAGNOSIS — I10 Essential (primary) hypertension: Secondary | ICD-10-CM

## 2018-07-04 NOTE — Telephone Encounter (Signed)
Medication denied.

## 2018-07-04 NOTE — Telephone Encounter (Signed)
Pt called to inquire why refill on labetalol was denied. Pt states she still uses 2x daily. Med list shows note for dose change effective 06/09/18. Please advise.

## 2018-07-04 NOTE — Telephone Encounter (Signed)
Spoke with pt to let her know that the medication was denied because of the dose change that was done in August and that the rx that was called in was for the old rx. I explained to pt that the new rx was sent in on 06/11/2018 but that I would call to make sure the pharmacy got it and if not I would give them a verbal for correct rx. Pt gave a verbal understanding. Called [facility] and they did not have the new rx on file so I gave a verbal and had them to go ahead and get it filled for pt because she would be by to pick up the rx today. [name] at [facility] stated that she would get it ready right now. Called pt back to let her know that the medication was called in and they are getting the rx ready for her now. Pt gave a verbal understanding.

## 2018-07-10 ENCOUNTER — Telehealth: Payer: Self-pay | Admitting: Internal Medicine

## 2018-07-10 DIAGNOSIS — R51 Headache: Secondary | ICD-10-CM | POA: Diagnosis not present

## 2018-07-10 NOTE — Telephone Encounter (Signed)
Pt came in and had a question  Dr. Melrose Nakayama told her that she needs to follow up with Dr. Derrel Nip in regards to her pain meds saying that they are not viable for headaches. Please contact pt also she dropped off her avs from Dr. Melrose Nakayama.

## 2018-07-10 NOTE — Telephone Encounter (Signed)
I'm not sure what her question is.  If she is asking me to give her directions on how to wean from her fentanyl,  I will do that.  I would like her to have the repeat MRI of the cervical spine that I ordered 2 weeks ago  because she may need to see Dr Sharlet Salina for an epidural injection in the cervical spine to help manage her headaches and this referral can't be made without a more recent MRI.

## 2018-07-10 NOTE — Telephone Encounter (Signed)
Pt has an appt scheduled for 07/21/2018. AVS has been placed in red folder for your review.

## 2018-07-11 NOTE — Telephone Encounter (Signed)
I AGREE WITH completely stopping the oxycodone and we can gradually wean the fentanyl by reducing the dose with each refill .  I have no problem with starting the nortriptyline

## 2018-07-11 NOTE — Telephone Encounter (Signed)
Spoke with pt and she stated that she would rather stay on the fentanyl patch and do without the oxycodone. Pt stated that she has the MRI scheduled for the September 20th. She also stated that Dr. Melrose Nakayama would like for her to try the Nortriptyline for the headaches but she wasn't sure if she could take that with the other pain medications that she is already taking. She stated that she will do whatever Dr. Derrel Nip tells her to do and if Dr. Derrel Nip thinks it is okay for her to take the Nortriptyline then she will take it.

## 2018-07-12 NOTE — Telephone Encounter (Signed)
Called and notified patient that Dr. Derrel Nip is agreeable to stopping the oxycodone and weaning her on the Fentanyl with reduction of the dose at each refill, and that Dr. Derrel Nip has not problem with starting on the nortriptyline. Patient verbalized understanding.

## 2018-07-14 ENCOUNTER — Ambulatory Visit
Admission: RE | Admit: 2018-07-14 | Discharge: 2018-07-14 | Disposition: A | Discharge status: Home / Self Care | Payer: Medicare Other | Source: Ambulatory Visit | Attending: Internal Medicine | Admitting: Internal Medicine

## 2018-07-14 DIAGNOSIS — M5030 Other cervical disc degeneration, unspecified cervical region: Secondary | ICD-10-CM | POA: Insufficient documentation

## 2018-07-14 DIAGNOSIS — M5020 Other cervical disc displacement, unspecified cervical region: Secondary | ICD-10-CM | POA: Insufficient documentation

## 2018-07-14 DIAGNOSIS — M4802 Spinal stenosis, cervical region: Secondary | ICD-10-CM | POA: Diagnosis not present

## 2018-07-14 DIAGNOSIS — M4722 Other spondylosis with radiculopathy, cervical region: Secondary | ICD-10-CM | POA: Diagnosis not present

## 2018-07-14 DIAGNOSIS — M50122 Cervical disc disorder at C5-C6 level with radiculopathy: Secondary | ICD-10-CM | POA: Diagnosis not present

## 2018-07-14 DIAGNOSIS — M50123 Cervical disc disorder at C6-C7 level with radiculopathy: Secondary | ICD-10-CM | POA: Diagnosis not present

## 2018-07-15 ENCOUNTER — Other Ambulatory Visit: Payer: Self-pay | Admitting: Internal Medicine

## 2018-07-15 MED ORDER — PREDNISONE 10 MG PO TABS: ORAL_TABLET | ORAL | 0 refills | Status: DC

## 2018-07-20 ENCOUNTER — Other Ambulatory Visit: Payer: Self-pay | Admitting: Internal Medicine

## 2018-07-20 DIAGNOSIS — M4802 Spinal stenosis, cervical region: Secondary | ICD-10-CM

## 2018-07-20 DIAGNOSIS — M47812 Spondylosis without myelopathy or radiculopathy, cervical region: Secondary | ICD-10-CM

## 2018-07-21 ENCOUNTER — Ambulatory Visit (INDEPENDENT_AMBULATORY_CARE_PROVIDER_SITE_OTHER): Payer: Medicare Other | Admitting: Internal Medicine

## 2018-07-21 DIAGNOSIS — K551 Chronic vascular disorders of intestine: Secondary | ICD-10-CM

## 2018-07-21 DIAGNOSIS — M47812 Spondylosis without myelopathy or radiculopathy, cervical region: Secondary | ICD-10-CM

## 2018-07-21 DIAGNOSIS — I150 Renovascular hypertension: Secondary | ICD-10-CM | POA: Diagnosis not present

## 2018-07-21 DIAGNOSIS — I714 Abdominal aortic aneurysm, without rupture, unspecified: Secondary | ICD-10-CM

## 2018-07-21 DIAGNOSIS — R51 Headache: Secondary | ICD-10-CM | POA: Diagnosis not present

## 2018-07-21 DIAGNOSIS — R519 Headache, unspecified: Secondary | ICD-10-CM

## 2018-07-21 DIAGNOSIS — I701 Atherosclerosis of renal artery: Secondary | ICD-10-CM

## 2018-07-21 DIAGNOSIS — M1611 Unilateral primary osteoarthritis, right hip: Secondary | ICD-10-CM | POA: Diagnosis not present

## 2018-07-21 MED ORDER — HYDRALAZINE HCL 50 MG PO TABS: 50.0000 mg | ORAL_TABLET | Freq: Three times a day (TID) | ORAL | 0 refills | Status: DC | PRN

## 2018-07-21 MED ORDER — FENTANYL 50 MCG/HR TD PT72: 50.0000 ug | MEDICATED_PATCH | TRANSDERMAL | 0 refills | Status: DC

## 2018-07-21 MED ORDER — CHLORDIAZEPOXIDE HCL 25 MG PO CAPS: ORAL_CAPSULE | ORAL | 5 refills | Status: DC

## 2018-07-21 MED ORDER — CHLORDIAZEPOXIDE HCL 25 MG PO CAPS: ORAL_CAPSULE | ORAL | 0 refills | Status: DC

## 2018-07-21 NOTE — Progress Notes (Signed)
Subjective:  Patient ID: Sabrina [name], female    DOB: [birth year]  Age: 77 y.o. MRN: [id number]  CC: Diagnoses of Atherosclerosis of renal artery ([place]), Abdominal aortic aneurysm (AAA) without rupture (HCC), 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[place]), Primary osteoarthritis of right hip, Cervical spondylosis without myelopathy, Chronic daily headache, and Renovascular hypertension were pertinent to this visit.  HPI Sabrina [name] presents for follow up on chornic headache/pain  With cervical spinal stenosis recently identified on MRI.   Started the dose of  prednisone 60 mg dose yesterday but developed .  tachycardia last night  Headache is somewhat better , however .  Not using oxycodone,  Still using the fentanyl patch and taking nortripytline newly  prescribed by her neurologist Dr.  Melrose Nakayama.    Hypertension:  She has been checking her BP at [facility] multiple times daily and notes that it has been  elevated  for the past 24 hours with a  large differential in arms R > L by 20 pts  She has noticed this consistently  Since June .  Needs to see schnier in late October.   Back has been hurting more lately as well.  But she recently spent 2 days tiling her bathroom floor .  Outpatient Medications Prior to Visit  Medication Sig Dispense Refill  . aspirin 81 MG tablet Take 81 mg by mouth daily.    . clopidogrel (PLAVIX) 75 MG tablet TAKE 1 TABLET BY MOUTH ONCE DAILY 90 tablet 1  . ezetimibe (ZETIA) 10 MG tablet Take 1 tablet (10 mg total) by mouth daily. 30 tablet 5  . ferrous sulfate (FEROSUL) 325 (65 FE) MG tablet Take 325 mg by mouth daily with breakfast.    . labetalol (NORMODYNE) 200 MG tablet Take 1 tablet (200 mg total) by mouth 2 (two) times daily. 180 tablet 1  . lisinopril (PRINIVIL,ZESTRIL) 40 MG tablet TAKE ONE TABLET BY MOUTH ONCE DAILY 90 tablet 1  . nortriptyline (PAMELOR) 10 MG capsule TAKE 1 CAPSULE BY MOUTH NIGHTLY FOR 1 WEEK THEN INCREASE TO 2 NIGHTLY FOR REFILLS USE RX [id number]  0    . Omega 3 1000 MG CAPS Take 1 capsule by mouth 2 (two) times daily.     . pantoprazole (PROTONIX) 40 MG tablet Take 1 tablet (40 mg total) by mouth daily. 30 tablet 3  . predniSONE (DELTASONE) 10 MG tablet 6 tablets daily for 3 days,  then reduce by 1 tablet daily until gone 21 tablet 0  . traMADol (ULTRAM) 50 MG tablet TAKE TWO TABLETS BY MOUTH EVERY 8 HOURS AS NEEDED 180 tablet 3  . baclofen (LIORESAL) 10 MG tablet Take 2 tablets at bedtime and during the day as needed for muscle spasm 120 tablet 5  . chlordiazePOXIDE (LIBRIUM) 25 MG capsule TAKE ONE CAPSULE BY MOUTH ONCE DAILY AS NEEDED FOR ANXIETY 30 capsule 5  . fentaNYL (DURAGESIC - DOSED MCG/HR) 50 MCG/HR Place 1 patch (50 mcg total) onto the skin every 3 (three) days. 10 patch 0  . hydrALAZINE (APRESOLINE) 50 MG tablet TAKE ONE TABLET BY MOUTH THREE TIMES DAILY AS NEEDED 270 tablet 0  . oxyCODONE-acetaminophen (PERCOCET) 10-325 MG tablet Take 1 tablet by mouth every 4 (four) hours as needed for pain. (Patient not taking: Reported on 07/21/2018) 90 tablet 0  . amLODipine (NORVASC) 10 MG tablet Take 1 tablet (10 mg total) by mouth daily. (Patient not taking: Reported on 06/29/2018) 90 tablet 1   No facility-administered medications prior to visit.  Review of Systems;  Patient denies headache, fevers, malaise, unintentional weight loss, skin rash, eye pain, sinus congestion and sinus pain, sore throat, dysphagia,  hemoptysis , cough, dyspnea, wheezing, chest pain, palpitations, orthopnea, edema, abdominal pain, nausea, melena, diarrhea, constipation, flank pain, dysuria, hematuria, urinary  Frequency, nocturia, numbness, tingling, seizures,  Focal weakness, Loss of consciousness,  Tremor, insomnia, depression, anxiety, and suicidal ideation.      Objective:  BP (!) 158/78 (BP Location: Right Arm, Patient Position: Sitting, Cuff Size: Normal)   Pulse 75   Temp 97.8 F (36.6 C) (Oral)   Resp 15   Ht 5\' 2"  (1.575 m)   Wt 151 lb 3.2 oz  (68.6 kg)   SpO2 99%   BMI 27.65 kg/m   BP Readings from Last 3 Encounters:  07/21/18 (!) 158/78  06/29/18 (!) 141/67  06/28/18 (!) 156/60    Wt Readings from Last 3 Encounters:  07/21/18 151 lb 3.2 oz (68.6 kg)  06/29/18 149 lb (67.6 kg)  06/28/18 145 lb 12.8 oz (66.1 kg)    General appearance: alert, cooperative and appears stated age Ears: normal TM's and external ear canals both ears Throat: lips, mucosa, and tongue normal; teeth and gums normal Neck: no adenopathy, no carotid bruit, supple, symmetrical, trachea midline and thyroid not enlarged, symmetric, no tenderness/mass/nodules Back: symmetric, no curvature. ROM normal. No CVA tenderness. Lungs: clear to auscultation bilaterally Heart: regular rate and rhythm, S1, S2 normal, no murmur, click, rub or gallop Abdomen: soft, non-tender; bowel sounds normal; no masses,  no organomegaly Pulses: 2+ and symmetric Skin: Skin color, texture, turgor normal. No rashes or lesions Lymph nodes: Cervical, supraclavicular, and axillary nodes normal.  Lab Results  Component Value Date   HGBA1C 5.9 07/19/2016   HGBA1C 5.8 06/02/2015    Lab Results  Component Value Date   CREATININE 1.10 02/22/2018   CREATININE 1.20 07/06/2017   CREATININE 1.18 (H) 02/07/2017    Lab Results  Component Value Date   WBC 5.1 05/24/2018   HGB 12.0 05/24/2018   HCT 35.8 05/24/2018   PLT 250 05/24/2018   GLUCOSE 87 02/22/2018   CHOL 167 02/22/2018   TRIG 53.0 02/22/2018   HDL 54.80 02/22/2018   LDLDIRECT 109.0 03/05/2016   LDLCALC 102 (H) 02/22/2018   ALT 11 02/22/2018   AST 18 02/22/2018   NA 136 02/22/2018   K 4.6 02/22/2018   CL 102 02/22/2018   CREATININE 1.10 02/22/2018   BUN 17 02/22/2018   CO2 28 02/22/2018   TSH 4.46 03/06/2018   INR 1.0 03/27/2014   HGBA1C 5.9 07/19/2016    Mr Cervical Spine Wo Contrast  Result Date: 07/14/2018 CLINICAL DATA:  Degenerative disc disease and radiculopathy. Spinal osteoarthritis EXAM: MRI  CERVICAL SPINE WITHOUT CONTRAST TECHNIQUE: Multiplanar, multisequence MR imaging of the cervical spine was performed. No intravenous contrast was administered. COMPARISON:  08/11/2015 FINDINGS: Alignment: Physiologic. Vertebrae: No fracture, evidence of discitis, or bone lesion. Cord: Normal signal Posterior Fossa, vertebral arteries, paraspinal tissues: 10 mm right posterior thyroid nodule that was also seen in 2016. Disc levels: C2-3: Mild disc bulging.  No impingement C3-4: Disc narrowing and bulging with a central protrusion flattening the cord against the mildly thickened ligamentum flavum. Mild uncovertebral spurring. The foramina are patent C4-5: Disc narrowing and bulging with uncovertebral spurring greater on the left. There is central protrusion flattening the cord against the ligamentum flavum. Advanced left foraminal stenosis. C5-6: Disc narrowing and bulging with endplate and uncovertebral ridging. Disc protrusion flattens the  cord. There is biforaminal impingement C6-7: Disc narrowing and bulging with left uncovertebral spurring. Mild left foraminal stenosis. C7-T1:Unremarkable. IMPRESSION: 1. No significant change when compared to 2016. 2. There is spinal stenosis with cord flattening from disc protrusion at C3-4 to C5-6. 3. C4-5 left and C5-6 bilateral foraminal impingement. Electronically Signed   By: Monte Fantasia M.D.   On: 07/14/2018 15:00    Assessment & Plan:   Problem List Items Addressed This Visit    Aortic aneurysm, abdominal (HCC)   Relevant Medications   hydrALAZINE (APRESOLINE) 50 MG tablet   amLODipine (NORVASC) 10 MG tablet   Atherosclerosis of renal artery (HCC)   Relevant Medications   hydrALAZINE (APRESOLINE) 50 MG tablet   amLODipine (NORVASC) 10 MG tablet   Cervical spondylosis without myelopathy    MRI cervical spine repeated (done in 2016,  No neurosurgical consultation done )  With advanced degenerative changes causing flattening of the cord against the  ligamentum flavum at C3-C4 level and foraminal stenosis at lower levels. It's possible that her unrelenting daily  headache (which has been problematic since 2013) is due in part to proximal cervical stenosis . Neurosurgical referral to Dr Lacinda Axon at [place] [facility] made.        Relevant Medications   fentaNYL (DURAGESIC - DOSED MCG/HR) 50 MCG/HR (Start on 07/28/2018)   Chronic daily headache    I suspect that the etiology is mu/ltifactorial.  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 Referral to  Neurosurgery is in process.   Certainly use of opioids is not optimal,  But given her diffuse vascular disease, abdominal pain,  uncontrolled hypertension, and CKD ,  NSAIDS are not an option.  Marland Kitchen  Her headache was better controlled on fentanyl, and she is tolerating the lower 50 mcg dose and not using oxycodone.  She did note improvement with one 60 mg dose of prednisone but developed tachycardia and elevated blood pressure readings so the dose has been reduced to 40 g with a 10 mg daily taper.  Neurology has started her on nortriptyline       Relevant Medications   nortriptyline (PAMELOR) 10 MG capsule   fentaNYL (DURAGESIC - DOSED MCG/HR) 50 MCG/HR (Start on 07/28/2018)   amLODipine (NORVASC) 10 MG tablet   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HCC)   Relevant Medications   hydrALAZINE (APRESOLINE) 50 MG tablet   amLODipine (NORVASC) 10 MG tablet   HTN (hypertension)    Resume amlodipine, which was stopped erroneously,  And continue hydralazine 50 mg tid, labetalol 200 mg bid  Lisinopril 40 mg daily       Relevant Medications   hydrALAZINE (APRESOLINE) 50 MG tablet   amLODipine (NORVASC) 10 MG tablet   Osteoarthritis of right hip   Relevant Medications   fentaNYL (DURAGESIC - DOSED MCG/HR) 50 MCG/HR (Start on 07/28/2018)     A total  of 25 minutes of face to face time was spent with patient more than half of which was spent in counselling about the above mentioned conditions  and coordination of care  I have discontinued Jonise A. Wible's baclofen. I have also changed her hydrALAZINE. Additionally, I am having her maintain her aspirin, ezetimibe, traMADol, Omega 3, pantoprazole, ferrous sulfate, clopidogrel, lisinopril, labetalol, oxyCODONE-acetaminophen, predniSONE, nortriptyline, chlordiazePOXIDE, fentaNYL, and amLODipine.  Meds ordered this encounter  Medications  . hydrALAZINE (APRESOLINE) 50 MG tablet    Sig: Take 1 tablet (50 mg total) by mouth 3 (three) times daily as needed. For bp > 150    Dispense:  270 tablet    Refill:  0  . DISCONTD: chlordiazePOXIDE (LIBRIUM) 25 MG capsule    Sig: TAKE ONE CAPSULE BY MOUTH ONCE DAILY AS NEEDED FOR ANXIETY    Dispense:  30 capsule    Refill:  5  . chlordiazePOXIDE (LIBRIUM) 25 MG capsule    Sig: TAKE ONE CAPSULE BY MOUTH ONCE DAILY AS NEEDED FOR ANXIETY    Dispense:  30 capsule    Refill:  0  . fentaNYL (DURAGESIC - DOSED MCG/HR) 50 MCG/HR    Sig: Place 1 patch (50 mcg total) onto the skin every 3 (three) days.    Dispense:  10 patch    Refill:  0  . amLODipine (NORVASC) 10 MG tablet    Sig: Take 1 tablet (10 mg total) by mouth daily.    Dispense:  90 tablet    Refill:  1    Medications Discontinued During This Encounter  Medication Reason  . hydrALAZINE (APRESOLINE) 50 MG tablet Reorder  . baclofen (LIORESAL) 10 MG tablet   . chlordiazePOXIDE (LIBRIUM) 25 MG capsule Reorder  . chlordiazePOXIDE (LIBRIUM) 25 MG capsule   . fentaNYL (DURAGESIC - DOSED MCG/HR) 50 MCG/HR Reorder  . amLODipine (NORVASC) 10 MG tablet Reorder    Follow-up: No follow-ups on file.   Crecencio Mc, MD

## 2018-07-21 NOTE — Patient Instructions (Addendum)
Take No prednisone today  Take 40 mg prednisone tomorrow morning Take 30 mg Sunday morning tAKE 20 mg Monday  Take 10 mg Tuesday   You will have 5 pills left over .  Save them  Use your hydralazine  Whenever your blood pressure is over 151    I am sending you to dr schnier

## 2018-07-23 MED ORDER — AMLODIPINE BESYLATE 10 MG PO TABS: 10.0000 mg | ORAL_TABLET | Freq: Every day | ORAL | 1 refills | Status: DC

## 2018-07-23 NOTE — Assessment & Plan Note (Signed)
I suspect that the etiology is mu/ltifactorial.  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 Referral to  Neurosurgery is in process.   Certainly use of opioids is not optimal,  But given her diffuse vascular disease, abdominal pain,  uncontrolled hypertension, and CKD ,  NSAIDS are not an option.  Sabrina [name]  Her headache was better controlled on fentanyl, and she is tolerating the lower 50 mcg dose and not using oxycodone.  She did note improvement with one 60 mg dose of prednisone but developed tachycardia and elevated blood pressure readings so the dose has been reduced to 40 g with a 10 mg daily taper.  Neurology has started her on nortriptyline

## 2018-07-23 NOTE — Assessment & Plan Note (Addendum)
MRI cervical spine repeated (done in 2016,  No neurosurgical consultation done )  With advanced degenerative changes causing flattening of the cord against the ligamentum flavum at C3-C4 level and foraminal stenosis at lower levels. It's possible that her unrelenting daily  headache (which has been problematic since 2013) is due in part to proximal cervical stenosis . Neurosurgical referral to Dr Lacinda Axon at [place] [facility] made.

## 2018-07-23 NOTE — Assessment & Plan Note (Signed)
Resume amlodipine, which was stopped erroneously,  And continue hydralazine 50 mg tid, labetalol 200 mg bid  Lisinopril 40 mg daily

## 2018-07-27 ENCOUNTER — Telehealth: Payer: Self-pay | Admitting: Internal Medicine

## 2018-07-27 NOTE — Telephone Encounter (Signed)
See encounter below for patient's questions. On 07/15/18 she was prescribed a prednisone taper pack. On LOV 07/21/18, Dr. Lupita Dawn note indicated the patient had taken the 1st day's dose of prednisone (60 MG) and had tachycardia and elevated b/p readings. Dr. Derrel Nip writes to continue the prednisone at 40 MG with a 10 MG daily taper. At the time of this visit, the patient tells me she had already taken all of the medication except for 5 tabs. She is asking for the prednisone to be called in.Please review and advise if: I'm interpreting the office note correctly  meaning Dr. Derrel Nip was not ordering an additional prednisone prescription because she understood the patient to say she had stopped taking the prednisone after having the tachycardia. The patient is expecting another complete prednisone prescription. I reviewed the hydralazine prn instructions with her. Please advise on the prednisone.

## 2018-07-27 NOTE — Telephone Encounter (Unsigned)
Copied from [place] [id number]. Topic: Quick Communication - See Telephone Encounter >> Jul 27, 2018  2:35 PM Hewitt Shorts wrote: Pt is calling to see if she should be continue to take prednizone and does she take the hydrolisine 3 times a day   Best number [phone]

## 2018-07-27 NOTE — Telephone Encounter (Signed)
Left voicemail to call, please transfer to office

## 2018-07-27 NOTE — Telephone Encounter (Signed)
Opened in error

## 2018-07-28 NOTE — Telephone Encounter (Signed)
Pt called to inquire about getting her medication today; contact to advise

## 2018-07-31 MED ORDER — PREDNISONE 10 MG PO TABS: ORAL_TABLET | ORAL | 0 refills | Status: DC

## 2018-07-31 NOTE — Telephone Encounter (Signed)
I  Do not recall saying that we would refill  the prednisone , but if she felt better on the prednisone I will call in another taper starting at 40 mg daily .  She has an appt with neurosurgeon , right?

## 2018-07-31 NOTE — Telephone Encounter (Signed)
Yes appointment with neurosurgeon is for 08/15/18

## 2018-07-31 NOTE — Telephone Encounter (Signed)
Patient stated because the prednisone made her feel better that you agreed to refill one more taper , I cannot find this in the chart It looks to me you advised to complete the taper she received on 06/28/18 on 9 /27/19 you gave specific steps in the AVS for taper patient says she has 5 tablets left from that taper and is requesting second taper .

## 2018-08-07 DIAGNOSIS — R809 Proteinuria, unspecified: Secondary | ICD-10-CM | POA: Diagnosis not present

## 2018-08-07 DIAGNOSIS — R319 Hematuria, unspecified: Secondary | ICD-10-CM | POA: Diagnosis not present

## 2018-08-07 DIAGNOSIS — I701 Atherosclerosis of renal artery: Secondary | ICD-10-CM | POA: Diagnosis not present

## 2018-08-07 DIAGNOSIS — N183 Chronic kidney disease, stage 3 (moderate): Secondary | ICD-10-CM | POA: Diagnosis not present

## 2018-08-07 NOTE — Telephone Encounter (Signed)
Pt calling stating that she was out of town and was returning [place] call she don't know when she called or what it was about

## 2018-08-08 NOTE — Telephone Encounter (Signed)
Spoke with patient advised that you had sent in the prednisone taper, she states she has taken and feels better. Patient was out of town last week. She has appointment with neurosurgeon on 08/15/18

## 2018-08-13 ENCOUNTER — Other Ambulatory Visit: Payer: Self-pay | Admitting: Internal Medicine

## 2018-08-14 ENCOUNTER — Ambulatory Visit (INDEPENDENT_AMBULATORY_CARE_PROVIDER_SITE_OTHER): Payer: Medicare Other

## 2018-08-14 ENCOUNTER — Other Ambulatory Visit: Payer: Self-pay

## 2018-08-14 ENCOUNTER — Telehealth: Payer: Self-pay | Admitting: Internal Medicine

## 2018-08-14 DIAGNOSIS — R51 Headache: Secondary | ICD-10-CM | POA: Diagnosis not present

## 2018-08-14 DIAGNOSIS — Z23 Encounter for immunization: Secondary | ICD-10-CM

## 2018-08-14 MED ORDER — LISINOPRIL 20 MG PO TABS: 20.0000 mg | ORAL_TABLET | Freq: Two times a day (BID) | ORAL | 1 refills | Status: DC

## 2018-08-14 MED ORDER — LISINOPRIL 20 MG PO TABS: 40.0000 mg | ORAL_TABLET | Freq: Two times a day (BID) | ORAL | 1 refills | Status: DC

## 2018-08-14 NOTE — Telephone Encounter (Signed)
Lisinopril dose changed to 20 mg bid and refilled for 90 days

## 2018-08-14 NOTE — Telephone Encounter (Signed)
Pt states that she is taking lisinopril 20mg  twice daily. Rx in the chart is 40mg  once daily. Is it okay to change the lisinopril to 20mg  twice daily and send in rx?

## 2018-08-14 NOTE — Telephone Encounter (Signed)
Spoke with pt and informed her that the medication has been changed and sent in to pharmacy. Pt gave a verbal understanding.

## 2018-08-14 NOTE — Telephone Encounter (Signed)
Call to pharmacy- they state the patient is wanting to fill 20 mg Rx  Call to patient- she states she is taking 20 mg two daily- one in am and one in pm. She states this is her current dosing. This prescription needs to be corrected if this is the case. Please send to PCP for review and correction of Rx for patient.

## 2018-08-14 NOTE — Telephone Encounter (Unsigned)
Copied from [place] [id number]. Topic: Quick Communication - See Telephone Encounter >> Aug 14, 2018  8:56 AM Marja Kays F wrote: Pt is needing a refill on lisinopril   [facility] [address]  Best number [phone]

## 2018-08-14 NOTE — Telephone Encounter (Signed)
Dr. Derrel Nip gave the verbal to change the rx to 40mg  twice daily because the pt stated that she was told verbally in her last office visit to increase dose.

## 2018-08-21 DIAGNOSIS — M4802 Spinal stenosis, cervical region: Secondary | ICD-10-CM | POA: Diagnosis not present

## 2018-09-13 ENCOUNTER — Other Ambulatory Visit: Payer: Self-pay | Admitting: Internal Medicine

## 2018-09-13 NOTE — Telephone Encounter (Signed)
Copied from [place] [id number]. Topic: Quick Communication - Rx Refill/Question >> Sep 13, 2018  2:00 PM Reyne Dumas L wrote: Medication: fentaNYL ([place] - DOSED MCG/HR) 50 MCG/HR  Has the patient contacted their pharmacy? Yes - states she has to contact doctors office for refill.  Pt states she just put her last patch on today and it lasts for three days (Agent: If no, request that the patient contact the pharmacy for the refill.) (Agent: If yes, when and what did the pharmacy advise?)  Preferred Pharmacy (with phone number or street name): [facility] ([facility] [place] [phone] (Phone) [phone] (Fax)  Agent: Please be advised that RX refills may take up to 3 business days. We ask that you follow-up with your pharmacy.

## 2018-09-13 NOTE — Progress Notes (Signed)
MRN : [id number]  Sabrina [name] is a 77 y.o. (1941/10/16) female who presents with chief complaint of No chief complaint on file. Marland Kitchen  History of Present Illness:   The patient is seen for follow up evaluation of carotid stenosis. The carotid stenosis followed by ultrasound.   The patient denies amaurosis fugax. There is no recent history of TIA symptoms or focal motor deficits. There is no prior documented CVA.  The patient is taking enteric-coated aspirin 81 mg daily.  There is no history of migraine headaches. There is no history of seizures.  She note occasional abdominal pain but it is not consistent and not associated with food.  She has not lost any weight.  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 Patient notes the pain is variable and not always associated with activity.  She notes that "rolling over in bed hurts".  Both legs are the same.  The pain is somewhat consistent day to day occurring on most days. The patient notes the pain also occurs with standing and routinely seems worse as the day wears on. The pain has been progressive over the past several years. The patient states these symptoms are causing  a profound negative impact on quality of life and daily activities.  The patient denies rest pain or dangling of an extremity off the side of the bed during the night for relief. No open wounds or sores at this time. No history of DVT or phlebitis.  There is a  history of back problems and DJD of the lumbar and sacral spine.   No outpatient medications have been marked as taking for the 09/14/18 encounter (Appointment) with Delana Meyer, [name], MD.    Past Medical History:  Diagnosis Date  . Anemia of chronic kidney failure   . Anxiety   . CAD (coronary artery disease)   . Carotid artery stenosis   . Chronic kidney disease, stage III (moderate) (HCC)    Followed  by Dr. Juleen China  . Hyperlipidemia   . Hypertension   . Renal artery stenosis ([place])   . Secondary hyperparathyroidism ([place])   . 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)     Past Surgical History:  Procedure Laterality Date  . ABDOMINAL HYSTERECTOMY  1976  . CAROTID ARTERY ANGIOPLASTY Left   . CAROTID ENDARTERECTOMY Left   . CHOLECYSTECTOMY    . COLONOSCOPY WITH PROPOFOL N/A 08/16/2017   Procedure: COLONOSCOPY WITH PROPOFOL;  Surgeon: [name], MD;  Location: [facility] ENDOSCOPY;  Service: Endoscopy;  Laterality: N/A;  . CORONARY ANGIOPLASTY WITH STENT PLACEMENT  2000  . CORONARY ARTERY BYPASS GRAFT  2000  . CYSTOSCOPY WITH STENT PLACEMENT Bilateral   . ESOPHAGOGASTRODUODENOSCOPY (EGD) WITH PROPOFOL N/A 08/16/2017   Procedure: ESOPHAGOGASTRODUODENOSCOPY (EGD) WITH PROPOFOL;  Surgeon: [name], MD;  Location: [facility] ENDOSCOPY;  Service: Endoscopy;  Laterality: N/A;  . ESOPHAGOGASTRODUODENOSCOPY (EGD) WITH PROPOFOL N/A 06/29/2018   Procedure: ESOPHAGOGASTRODUODENOSCOPY (EGD) WITH PROPOFOL;  Surgeon: Virgel Manifold, MD;  Location: [facility] ENDOSCOPY;  Service: Endoscopy;  Laterality: N/A;  . JOINT REPLACEMENT Bilateral 2000  . RENAL ARTERY ANGIOPLASTY Bilateral mARCH 2015  . TOE AMPUTATION Right    small toe  . TONSILLECTOMY AND ADENOIDECTOMY    . TOTAL HIP ARTHROPLASTY Left   . TOTAL HIP ARTHROPLASTY Right 2015    Social History Social History  Tobacco Use  . Smoking status: Former Smoker    Last attempt to quit: 10/25/1998    Years since quitting: 19.8  . Smokeless tobacco: Never Used  Substance Use Topics  . Alcohol use: No    Alcohol/week: 0.0 standard drinks  . Drug use: Never    Family History Family History  Problem Relation Age of Onset  . Stroke Mother   . Hypertension Mother   . Diabetes Mother   . Hypertension Father   . Heart disease Sister        MI  . Multiple sclerosis Daughter   . Multiple sclerosis  Son   . Cerebral aneurysm Son   . Seizures Son   . Cerebral aneurysm Son   . Breast cancer Paternal Aunt 48    Allergies  Allergen Reactions  . Citalopram     Throat closing   . Dilaudid [Hydromorphone Hcl] Nausea And Vomiting  . Hydrochlorothiazide Other (See Comments)    Decreased GFR (Nov 2015)  . Nsaids     CKD stage III - avoid nephrotoxic drugs  . Nubain [Nalbuphine Hcl]     Burning sensation in back  . Penicillins Itching  . Prasugrel Itching  . Statins Itching     REVIEW OF SYSTEMS (Negative unless checked)  Constitutional: [] Weight loss  [] Fever  [] Chills Cardiac: [] Chest pain   [] Chest pressure   [] Palpitations   [] Shortness of breath when laying flat   [] Shortness of breath with exertion. Vascular:  [x] Pain in legs with walking   [] Pain in legs at rest  [] History of DVT   [] Phlebitis   [x] Swelling in legs   [] Varicose veins   [] Non-healing ulcers Pulmonary:   [] Uses home oxygen   [] Productive cough   [] Hemoptysis   [] Wheeze  [] COPD   [] Asthma Neurologic:  [] Dizziness   [] Seizures   [] History of stroke   [] History of TIA  [] Aphasia   [] Vissual changes   [] Weakness or numbness in arm   [] Weakness or numbness in leg Musculoskeletal:   [] Joint swelling   [x] Joint pain   [x] Low back pain Hematologic:  [] Easy bruising  [] Easy bleeding   [] Hypercoagulable state   [] Anemic Gastrointestinal:  [] Diarrhea   [] Vomiting  [] Gastroesophageal reflux/heartburn   [] Difficulty swallowing. Genitourinary:  [] Chronic kidney disease   [] Difficult urination  [] Frequent urination   [] Blood in urine Skin:  [] Rashes   [] Ulcers  Psychological:  [] History of anxiety   []  History of major depression.  Physical Examination  There were no vitals filed for this visit. There is no height or weight on file to calculate BMI. Gen: WD/WN, NAD Head: [place]/AT, No temporalis wasting.  Ear/Nose/Throat: Hearing grossly intact, nares w/o erythema or drainage Eyes: PER, EOMI, sclera nonicteric.  Neck:  Supple, no large masses.   Pulmonary:  Good air movement, no audible wheezing bilaterally, no use of accessory muscles.  Cardiac: RRR, no JVD Vascular:  Cluster of varicose veins left lateral ankle Vessel Right Left  Radial Palpable Palpable  PT Not Palpable Not Palpable  DP Not Palpable Not Palpable  Gastrointestinal: Non-distended. No guarding/no peritoneal signs.  Musculoskeletal: M/S 5/5 throughout.  No deformity or atrophy.  Neurologic: CN 2-12 intact. Symmetrical.  Speech is fluent. Motor exam as listed above. Psychiatric: Judgment intact, Mood & affect appropriate for pt's clinical situation. Dermatologic: venous rashes no ulcers noted.  No changes consistent with cellulitis. Lymph : No lichenification or skin changes of chronic lymphedema.  CBC Lab Results  Component Value Date   WBC 5.1  05/24/2018   HGB 12.0 05/24/2018   HCT 35.8 05/24/2018   MCV 86.8 05/24/2018   PLT 250 05/24/2018    BMET    Component Value Date/Time   NA 136 02/22/2018 0835   NA 139 09/13/2014   NA 134 (L) 04/12/2014 0558   K 4.6 02/22/2018 0835   K 4.4 04/12/2014 0558   CL 102 02/22/2018 0835   CL 101 04/12/2014 0558   CO2 28 02/22/2018 0835   CO2 27 04/12/2014 0558   GLUCOSE 87 02/22/2018 0835   GLUCOSE 90 04/12/2014 0558   BUN 17 02/22/2018 0835   BUN 18 09/13/2014   BUN 16 04/12/2014 0558   CREATININE 1.10 02/22/2018 0835   CREATININE 1.12 04/12/2014 0558   CALCIUM 9.3 02/22/2018 0835   CALCIUM 8.5 04/12/2014 0558   GFRNONAA 44 (L) 02/07/2017 1627   GFRNONAA 49 (L) 04/12/2014 0558   GFRAA 51 (L) 02/07/2017 1627   GFRAA 57 (L) 04/12/2014 0558   CrCl cannot be calculated (Patient's most recent lab result is older than the maximum 21 days allowed.).  COAG Lab Results  Component Value Date   INR 1.0 03/27/2014    Radiology No results found.   Assessment/Plan 1. Peripheral vascular disease ([place]) Recommend:  The patient has experienced severe symptoms of pain in the hips  and buttock and is now describing lifestyle limiting claudication.  Given the severity of the patient's lower extremity symptoms I have discussed angiography and intervention.  Risk and benefits were reviewed the patient.  Indications for the procedure were reviewed.  All questions were answered, the patient wishes to consider this but does not agree to proceed at this time.   The patient should continue walking and begin a more formal exercise program.  The patient should continue antiplatelet therapy and aggressive treatment of the lipid abnormalities  The patient will follow up with me in one year if she chooses not to move forward with angiogram .   - VAS US AORTA/IVC/ILIACS; Future - VAS [place] ABI WITH/WO TBI; Future  2. Bilateral carotid artery stenosis Recommend:  Given the patient's asymptomatic subcritical stenosis no further invasive testing or surgery at this time.  Duplex ultrasound shows chronic occlusion of the RICA and 40-59% stenosis of the LICA.  Continue antiplatelet therapy as prescribed Continue management of CAD, HTN and Hyperlipidemia Healthy heart diet,  encouraged exercise at least 4 times per week  Follow up in 12 months with duplex ultrasound and physical exam    - VAS US CAROTID; Future  3. 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HCC) Recommend:  The patient has evidence of chronic asymptomatic mesenteric atherosclerosis.  The patient denies lifestyle limiting changes at this point in time.  Given the lack of symptoms no intervention is warranted at this time.   No further invasive studies, angiography or surgery at this time  The patient should continue walking and begin a more formal exercise program.   The patient should continue antiplatelet therapy and aggressive treatment of the lipid abnormalities  Patient should undergo noninvasive studies as ordered. The patient will follow up with me after the studies.    4. Atherosclerosis of renal artery ([place]) Given  patient's arterial disease optimal control of the patient's hypertension is important. BP is acceptable today  The patient's vital signs and noninvasive studies support the renal artery stenosis is not significantly increased when compared to the previous study.  No invasive studies or intervention is indicated at this time.  The patient will continue the current  antihypertensive medications, no changes at this time.  The primary medical service will continue aggressive antihypertensive therapy as per the AHA guidelines   5. Coronary artery disease involving native coronary artery of native heart without angina pectoris Continue cardiac and antihypertensive medications as already ordered and reviewed, no changes at this time.  Continue statin as ordered and reviewed, no changes at this time  Nitrates PRN for chest pain   6. Gastroesophageal reflux disease without esophagitis Continue PPI as already ordered, this medication has been reviewed and there are no changes at this time.  Avoidence of caffeine and alcohol  Moderate elevation of the head of the bed   7. Mixed hyperlipidemia Continue statin as ordered and reviewed, no changes at this time    Hortencia Pilar, MD  09/13/2018 2:04 PM

## 2018-09-14 ENCOUNTER — Ambulatory Visit (INDEPENDENT_AMBULATORY_CARE_PROVIDER_SITE_OTHER): Payer: Medicare Other

## 2018-09-14 ENCOUNTER — Other Ambulatory Visit (INDEPENDENT_AMBULATORY_CARE_PROVIDER_SITE_OTHER): Payer: Self-pay | Admitting: Vascular Surgery

## 2018-09-14 ENCOUNTER — Ambulatory Visit (INDEPENDENT_AMBULATORY_CARE_PROVIDER_SITE_OTHER): Payer: Medicare Other | Admitting: Vascular Surgery

## 2018-09-14 ENCOUNTER — Telehealth: Payer: Self-pay

## 2018-09-14 ENCOUNTER — Encounter (INDEPENDENT_AMBULATORY_CARE_PROVIDER_SITE_OTHER): Payer: Self-pay | Admitting: Vascular Surgery

## 2018-09-14 VITALS — BP 94/61 | HR 86 | Resp 17 | Ht 61.0 in | Wt 155.0 lb

## 2018-09-14 DIAGNOSIS — I2510 Atherosclerotic heart disease of native coronary artery without angina pectoris: Secondary | ICD-10-CM | POA: Diagnosis not present

## 2018-09-14 DIAGNOSIS — I6523 Occlusion and stenosis of bilateral carotid arteries: Secondary | ICD-10-CM | POA: Diagnosis not present

## 2018-09-14 DIAGNOSIS — I714 Abdominal aortic aneurysm, without rupture, unspecified: Secondary | ICD-10-CM

## 2018-09-14 DIAGNOSIS — I771 Stricture of artery: Secondary | ICD-10-CM

## 2018-09-14 DIAGNOSIS — I701 Atherosclerosis of renal artery: Secondary | ICD-10-CM

## 2018-09-14 DIAGNOSIS — E782 Mixed hyperlipidemia: Secondary | ICD-10-CM | POA: Diagnosis not present

## 2018-09-14 DIAGNOSIS — K219 Gastro-esophageal reflux disease without esophagitis: Secondary | ICD-10-CM

## 2018-09-14 DIAGNOSIS — K551 Chronic vascular disorders of intestine: Secondary | ICD-10-CM | POA: Diagnosis not present

## 2018-09-14 DIAGNOSIS — I77811 Abdominal aortic ectasia: Secondary | ICD-10-CM

## 2018-09-14 DIAGNOSIS — Z7982 Long term (current) use of aspirin: Secondary | ICD-10-CM

## 2018-09-14 DIAGNOSIS — I739 Peripheral vascular disease, unspecified: Secondary | ICD-10-CM

## 2018-09-14 MED ORDER — FENTANYL 50 MCG/HR TD PT72: 50.0000 ug | MEDICATED_PATCH | TRANSDERMAL | 0 refills | Status: DC

## 2018-09-14 NOTE — Telephone Encounter (Signed)
Fentanyl refilled for 2 months.  Needs office  Visit in mid January TO RECEIE ADDITIONAL REFILLS

## 2018-09-14 NOTE — Telephone Encounter (Signed)
Requested medication (s) are due for refill today: yes  Requested medication (s) are on the active medication list: yes    Last refill: 07/21/18 #10  0 refills  Future visit scheduled no  Notes to clinic:not delegated  Requested Prescriptions  Pending Prescriptions Disp Refills   fentaNYL (DURAGESIC - DOSED MCG/HR) 50 MCG/HR 10 patch 0    Sig: Place 1 patch (50 mcg total) onto the skin every 3 (three) days.     Not Delegated - Analgesics:  Opioid Agonists Failed - 09/14/2018  8:49 AM      Failed - This refill cannot be delegated      Failed - Urine Drug Screen completed in last 360 days.      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

## 2018-09-14 NOTE — Telephone Encounter (Signed)
Copied from [place] [id number]. Topic: General - Other >> Sep 14, 2018  3:31 PM Ivar Drape wrote: Reason for CRM:    Patient would like a call back from [place] or [place] before the end of the day.  She would not leave what she was calling about.

## 2018-09-14 NOTE — Telephone Encounter (Signed)
Patient called requesting refill on fentanyl patches last fill 10 patches on 07/28/18.

## 2018-09-15 NOTE — Telephone Encounter (Signed)
Fentanyl refills sent last night

## 2018-09-26 ENCOUNTER — Ambulatory Visit: Payer: Medicare Other

## 2018-09-27 ENCOUNTER — Other Ambulatory Visit: Payer: Self-pay | Admitting: Internal Medicine

## 2018-09-28 ENCOUNTER — Other Ambulatory Visit: Payer: Self-pay

## 2018-09-28 MED ORDER — PANTOPRAZOLE SODIUM 40 MG PO TBEC: 40.0000 mg | DELAYED_RELEASE_TABLET | Freq: Every day | ORAL | 3 refills | Status: DC

## 2018-09-29 NOTE — Telephone Encounter (Signed)
please call patient re tramadol  Refill and ask about concurrent use with fentanyl,  Is she currently  using both?

## 2018-10-05 NOTE — Telephone Encounter (Signed)
I tried to call patient, but unable to leave message.

## 2018-10-10 NOTE — Telephone Encounter (Signed)
Pt states that she is using her fentanyl patch and changing it every 3 days and only taking the tramadol as needed. Pt also requesting a call from [place].

## 2018-10-11 ENCOUNTER — Inpatient Hospital Stay: Payer: Medicare Other | Attending: Oncology

## 2018-10-11 ENCOUNTER — Other Ambulatory Visit: Payer: Self-pay

## 2018-10-11 DIAGNOSIS — D509 Iron deficiency anemia, unspecified: Secondary | ICD-10-CM | POA: Diagnosis not present

## 2018-10-11 DIAGNOSIS — Z79899 Other long term (current) drug therapy: Secondary | ICD-10-CM | POA: Diagnosis not present

## 2018-10-11 DIAGNOSIS — Z87891 Personal history of nicotine dependence: Secondary | ICD-10-CM | POA: Insufficient documentation

## 2018-10-11 DIAGNOSIS — Z7982 Long term (current) use of aspirin: Secondary | ICD-10-CM | POA: Insufficient documentation

## 2018-10-11 DIAGNOSIS — N183 Chronic kidney disease, stage 3 (moderate): Secondary | ICD-10-CM | POA: Diagnosis not present

## 2018-10-11 DIAGNOSIS — I10 Essential (primary) hypertension: Secondary | ICD-10-CM | POA: Diagnosis not present

## 2018-10-11 LAB — IRON AND TIBC
Iron: 75 ug/dL (ref 28–170)
Saturation Ratios: 24 % (ref 10.4–31.8)
TIBC: 319 ug/dL (ref 250–450)
UIBC: 245 ug/dL

## 2018-10-11 LAB — CBC WITH DIFFERENTIAL/PLATELET
Abs Immature Granulocytes: 0.01 10*3/uL (ref 0.00–0.07)
Basophils Absolute: 0 10*3/uL (ref 0.0–0.1)
Basophils Relative: 1 %
Eosinophils Absolute: 1.2 10*3/uL — ABNORMAL HIGH (ref 0.0–0.5)
Eosinophils Relative: 19 %
HCT: 34.2 % — ABNORMAL LOW (ref 36.0–46.0)
Hemoglobin: 11 g/dL — ABNORMAL LOW (ref 12.0–15.0)
Immature Granulocytes: 0 %
Lymphocytes Relative: 34 %
Lymphs Abs: 2 10*3/uL (ref 0.7–4.0)
MCH: 28.4 pg (ref 26.0–34.0)
MCHC: 32.2 g/dL (ref 30.0–36.0)
MCV: 88.1 fL (ref 80.0–100.0)
Monocytes Absolute: 0.7 10*3/uL (ref 0.1–1.0)
Monocytes Relative: 12 %
Neutro Abs: 2.1 10*3/uL (ref 1.7–7.7)
Neutrophils Relative %: 34 %
Platelets: 242 10*3/uL (ref 150–400)
RBC: 3.88 MIL/uL (ref 3.87–5.11)
RDW: 12.6 % (ref 11.5–15.5)
WBC: 6 10*3/uL (ref 4.0–10.5)
nRBC: 0 % (ref 0.0–0.2)

## 2018-10-11 LAB — FERRITIN: Ferritin: 53 ng/mL (ref 11–307)

## 2018-10-11 NOTE — Telephone Encounter (Signed)
Refilled: 11/11/2017 Last OV: 07/21/2018 Next OV: not scheduled

## 2018-10-12 ENCOUNTER — Other Ambulatory Visit: Payer: Self-pay | Admitting: Internal Medicine

## 2018-10-12 DIAGNOSIS — I10 Essential (primary) hypertension: Secondary | ICD-10-CM

## 2018-10-12 MED ORDER — TRAMADOL HCL 50 MG PO TABS: ORAL_TABLET | ORAL | 3 refills | Status: DC

## 2018-10-12 MED ORDER — LABETALOL HCL 200 MG PO TABS: 200.0000 mg | ORAL_TABLET | Freq: Two times a day (BID) | ORAL | 1 refills | Status: DC

## 2018-10-12 NOTE — Telephone Encounter (Signed)
Pt called to check status

## 2018-10-12 NOTE — Telephone Encounter (Signed)
Tramadol refilled for #60/month bc it is a controlled substance and I need to know how often she is using it in order to fill it for more than that

## 2018-10-12 NOTE — Telephone Encounter (Signed)
Patient stated that she still has some tramadol left from prescription in January. She only takes 1-2 times per month. Her actual prescription was about to expire that's why she was requesting a refill.

## 2018-10-12 NOTE — Telephone Encounter (Signed)
I spoke with patient & she stated she is taking this as needed with fentanyl patch.

## 2018-10-12 NOTE — Telephone Encounter (Signed)
Called pharmacy - stated pt has a refill ready to pick up Called pt- unable to leave voice mail- mailbox is full

## 2018-10-12 NOTE — Telephone Encounter (Signed)
Copied from [place] [phone]. Topic: Quick Communication - See Telephone Encounter >> Oct 12, 2018  9:00 AM Ivar Drape wrote: CRM for notification. See Telephone encounter for: 10/12/18. Patient would like a refill of her labetalol (NORMODYNE) 200 MG tablet medication and have it sent to her preferred pharmacist [facility] on [address]. She called the pharmacist on 10/06/18 and now she is out of the medication.

## 2018-10-13 ENCOUNTER — Other Ambulatory Visit: Payer: Self-pay

## 2018-10-13 ENCOUNTER — Encounter: Payer: Self-pay | Admitting: Oncology

## 2018-10-13 ENCOUNTER — Inpatient Hospital Stay (HOSPITAL_BASED_OUTPATIENT_CLINIC_OR_DEPARTMENT_OTHER): Payer: Medicare Other | Admitting: Oncology

## 2018-10-13 VITALS — BP 151/77 | HR 71 | Temp 95.8°F | Resp 18 | Ht 61.0 in | Wt 158.2 lb

## 2018-10-13 DIAGNOSIS — Z87891 Personal history of nicotine dependence: Secondary | ICD-10-CM

## 2018-10-13 DIAGNOSIS — D509 Iron deficiency anemia, unspecified: Secondary | ICD-10-CM

## 2018-10-13 DIAGNOSIS — I10 Essential (primary) hypertension: Secondary | ICD-10-CM | POA: Diagnosis not present

## 2018-10-13 DIAGNOSIS — Z7982 Long term (current) use of aspirin: Secondary | ICD-10-CM | POA: Diagnosis not present

## 2018-10-13 DIAGNOSIS — Z79899 Other long term (current) drug therapy: Secondary | ICD-10-CM | POA: Diagnosis not present

## 2018-10-13 DIAGNOSIS — N183 Chronic kidney disease, stage 3 unspecified: Secondary | ICD-10-CM

## 2018-10-13 NOTE — Progress Notes (Signed)
Patient here today for follow up.   

## 2018-10-14 NOTE — Progress Notes (Signed)
Hematology/Oncology follow up  note [facility] Telephone:(336) [phone] Fax:(336) [phone]   Patient Care Team: Crecencio Mc, MD as PCP - General (Internal Medicine)  REFERRING PROVIDER: Crecencio Mc, MD REASON FOR VISIT Follow up for management of anemia   HISTORY OF PRESENTING ILLNESS:  Sabrina [name] is a  77 y.o.  female with PMH listed below who was referred to me for evaluation of anemia.  Patient recently had lab work done which revealed anemia with hemoglobin 11.5.   Reviewed patient's previous labs, her hemoglobin ranges from 10.8 to 11.8 in the past.  Reports feeling fatigue.  Patient denies weight loss, easy bruising, hematochezia, hemoptysis.  Has history of CKD and follows up with nephrologist.  Chronic pain syndrome. On Tramadol and fentanyl patch.  INTERVAL HISTORY Sabrina [name] is a 77 y.o. female who has above history reviewed by me today presents for follow up visit for management of anemia.  Chronic fatigue is at baseline. No new complaints.   Review of Systems  Constitutional: Positive for malaise/fatigue. Negative for chills, fever and weight loss.  HENT: Negative for sore throat.   Eyes: Negative for redness.  Respiratory: Negative for cough, shortness of breath and wheezing.   Cardiovascular: Negative for chest pain, palpitations and leg swelling.  Gastrointestinal: Negative for abdominal pain, blood in stool, nausea and vomiting.  Genitourinary: Negative for dysuria.  Musculoskeletal: Negative for myalgias.  Skin: Negative for rash.  Neurological: Negative for dizziness, tingling and tremors.  Endo/Heme/Allergies: Does not bruise/bleed easily.  Psychiatric/Behavioral: Negative for hallucinations.    MEDICAL HISTORY:  Past Medical History:  Diagnosis Date  . Anemia of chronic kidney failure   . Anxiety   . CAD (coronary artery disease)   . Carotid artery stenosis   . Chronic kidney disease, stage III  (moderate) (HCC)    Followed by Dr. Juleen China  . Hyperlipidemia   . Hypertension   . Renal artery stenosis ([place])   . Secondary hyperparathyroidism ([place])   . 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 ABDOMINAL HYSTERECTOMY  1976  . CAROTID ARTERY ANGIOPLASTY Left   . CAROTID ENDARTERECTOMY Left   . CHOLECYSTECTOMY    . COLONOSCOPY WITH PROPOFOL N/A 08/16/2017   Procedure: COLONOSCOPY WITH PROPOFOL;  Surgeon: [name], MD;  Location: [facility] ENDOSCOPY;  Service: Endoscopy;  Laterality: N/A;  . CORONARY ANGIOPLASTY WITH STENT PLACEMENT  2000  . CORONARY ARTERY BYPASS GRAFT  2000  . CYSTOSCOPY WITH STENT PLACEMENT Bilateral   . ESOPHAGOGASTRODUODENOSCOPY (EGD) WITH PROPOFOL N/A 08/16/2017   Procedure: ESOPHAGOGASTRODUODENOSCOPY (EGD) WITH PROPOFOL;  Surgeon: [name], MD;  Location: [facility] ENDOSCOPY;  Service: Endoscopy;  Laterality: N/A;  . ESOPHAGOGASTRODUODENOSCOPY (EGD) WITH PROPOFOL N/A 06/29/2018   Procedure: ESOPHAGOGASTRODUODENOSCOPY (EGD) WITH PROPOFOL;  Surgeon: Virgel Manifold, MD;  Location: [facility] ENDOSCOPY;  Service: Endoscopy;  Laterality: N/A;  . JOINT REPLACEMENT Bilateral 2000  . RENAL ARTERY ANGIOPLASTY Bilateral mARCH 2015  . TOE AMPUTATION Right    small toe  . TONSILLECTOMY AND ADENOIDECTOMY    . TOTAL HIP ARTHROPLASTY Left   . TOTAL HIP ARTHROPLASTY Right 2015    SOCIAL HISTORY: Social History   Socioeconomic History  . Marital status: Divorced    Spouse name: Not on file  . Number of children: Not on file  . Years of education: Not on file  . Highest education  level: Not on file  Occupational History  . Occupation: retired  Scientific laboratory technician  . Financial resource strain: Not hard at all  . Food insecurity:    Worry: Never true    Inability: Never true  . Transportation needs:    Medical: No    Non-medical: No  Tobacco Use    . Smoking status: Former Smoker    Last attempt to quit: 10/25/1998    Years since quitting: 19.9  . Smokeless tobacco: Never Used  Substance and Sexual Activity  . Alcohol use: No    Alcohol/week: 0.0 standard drinks  . Drug use: Never  . Sexual activity: Not Currently  Lifestyle  . Physical activity:    Days per week: 0 days    Minutes per session: 0 min  . Stress: Patient refused  Relationships  . Social connections:    Talks on phone: Patient refused    Gets together: Patient refused    Attends religious service: Patient refused    Active member of club or organization: Patient refused    Attends meetings of clubs or organizations: Patient refused    Relationship status: Patient refused  . Intimate partner violence:    Fear of current or ex partner: No    Emotionally abused: No    Physically abused: No    Forced sexual activity: No  Other Topics Concern  . Not on file  Social History Narrative  . Not on file    FAMILY HISTORY: Family History  Problem Relation Age of Onset  . Stroke Mother   . Hypertension Mother   . Diabetes Mother   . Hypertension Father   . Heart disease Sister        MI  . Multiple sclerosis Daughter   . Multiple sclerosis Son   . Cerebral aneurysm Son   . Seizures Son   . Cerebral aneurysm Son   . Breast cancer Paternal Aunt 2    ALLERGIES:  is allergic to citalopram; dilaudid [hydromorphone hcl]; hydrochlorothiazide; nsaids; nubain [nalbuphine hcl]; penicillins; prasugrel; and statins.  MEDICATIONS:  Current Outpatient Medications  Medication Sig Dispense Refill  . aspirin 81 MG tablet Take 81 mg by mouth daily.    . chlordiazePOXIDE (LIBRIUM) 25 MG capsule TAKE ONE CAPSULE BY MOUTH ONCE DAILY AS NEEDED FOR ANXIETY 30 capsule 0  . clopidogrel (PLAVIX) 75 MG tablet TAKE 1 TABLET BY MOUTH ONCE DAILY 90 tablet 1  . ezetimibe (ZETIA) 10 MG tablet Take 1 tablet (10 mg total) by mouth daily. 30 tablet 5  . fentaNYL (DURAGESIC - DOSED  MCG/HR) 50 MCG/HR Place 1 patch (50 mcg total) onto the skin every 3 (three) days. 10 patch 0  . fentaNYL (DURAGESIC - DOSED MCG/HR) 50 MCG/HR Place 1 patch (50 mcg total) onto the skin every 3 (three) days. 10 patch 0  . hydrALAZINE (APRESOLINE) 50 MG tablet Take 1 tablet (50 mg total) by mouth 3 (three) times daily as needed. For bp > 150 270 tablet 0  . labetalol (NORMODYNE) 200 MG tablet Take 1 tablet (200 mg total) by mouth 2 (two) times daily. 180 tablet 1  . lisinopril (PRINIVIL,ZESTRIL) 20 MG tablet Take 2 tablets (40 mg total) by mouth 2 (two) times daily. 360 tablet 1  . Multiple Vitamin (MULTIVITAMIN) tablet Take 1 tablet by mouth daily.    . nortriptyline (PAMELOR) 10 MG capsule TAKE 1 CAPSULE BY MOUTH NIGHTLY FOR 1 WEEK THEN INCREASE TO 2 NIGHTLY FOR REFILLS USE RX [id number]  0  .  Omega 3 1000 MG CAPS Take 1 capsule by mouth 2 (two) times daily.     . pantoprazole (PROTONIX) 40 MG tablet Take 1 tablet (40 mg total) by mouth daily. 30 tablet 3  . traMADol (ULTRAM) 50 MG tablet TAKE TWO TABLETS BY MOUTH EVERY 8 HOURS AS NEEDED 60 tablet 3   No current facility-administered medications for this visit.      PHYSICAL EXAMINATION: ECOG PERFORMANCE STATUS: 1 - Symptomatic but completely ambulatory Vitals:   10/13/18 1325  BP: (!) 151/77  Pulse: 71  Resp: 18  Temp: (!) 95.8 F (35.4 C)   Filed Weights   10/13/18 1325  Weight: 158 lb 4 oz (71.8 kg)    Physical Exam Constitutional:      General: She is not in acute distress.    Appearance: She is well-developed.  HENT:     Head: Normocephalic and atraumatic.     Right Ear: External ear normal.     Left Ear: External ear normal.  Eyes:     General: No scleral icterus.    Conjunctiva/sclera: Conjunctivae normal.     Pupils: Pupils are equal, round, and reactive to light.  Neck:     Musculoskeletal: Normal range of motion and neck supple.  Cardiovascular:     Rate and Rhythm: Normal rate and regular rhythm.     Heart  sounds: Normal heart sounds.  Pulmonary:     Effort: Pulmonary effort is normal. No respiratory distress.     Breath sounds: Normal breath sounds. No wheezing or rales.  Chest:     Chest wall: No tenderness.  Abdominal:     General: Bowel sounds are normal. There is no distension.     Palpations: Abdomen is soft. There is no mass.     Tenderness: There is no abdominal tenderness.  Musculoskeletal: Normal range of motion.        General: No deformity.  Lymphadenopathy:     Cervical: No cervical adenopathy.  Skin:    General: Skin is warm and dry.     Findings: No erythema or rash.  Neurological:     Mental Status: She is alert and oriented to person, place, and time.     Cranial Nerves: No cranial nerve deficit.     Coordination: Coordination normal.  Psychiatric:        Behavior: Behavior normal.        Thought Content: Thought content normal.      LABORATORY DATA:  I have reviewed the data as listed Lab Results  Component Value Date   WBC 6.0 10/11/2018   HGB 11.0 (L) 10/11/2018   HCT 34.2 (L) 10/11/2018   MCV 88.1 10/11/2018   PLT 242 10/11/2018   Recent Labs    02/22/18 0835 03/06/18 1357  NA 136  --   K 4.6  --   CL 102  --   CO2 28  --   GLUCOSE 87  --   BUN 17  --   CREATININE 1.10  --   CALCIUM 9.3  --   PROT 6.8 6.9  ALBUMIN 3.9  --   AST 18  --   ALT 11  --   ALKPHOS 61  --   BILITOT 0.6  --        ASSESSMENT & PLAN:  1. Iron deficiency anemia, unspecified iron deficiency anemia type   2. Stage 3 chronic kidney disease ([place])    Labs are reviewed and discussed with patient.  Hemoglobin slightly  decreased. Iron labs stable.  Anemia of chronic disease.  Discussed with patient that hemoglobin level is relatively stable, acceptable.  Continue take daily Slow Fe.  Repeat labs in 3 months.   Return of visit: 3 months.  Total face to face encounter time for this patient visit was 15 min. >50% of the time was  spent in counseling and  coordination of care.   Earlie Server, MD, PhD Hematology Oncology [facility] at [facility] Pager- [phone] 10/14/2018

## 2018-10-20 ENCOUNTER — Other Ambulatory Visit: Payer: Self-pay | Admitting: Internal Medicine

## 2018-11-20 ENCOUNTER — Other Ambulatory Visit: Payer: Self-pay | Admitting: Internal Medicine

## 2018-12-06 ENCOUNTER — Telehealth: Payer: Self-pay | Admitting: Internal Medicine

## 2018-12-06 MED ORDER — FENTANYL 50 MCG/HR TD PT72: 1.0000 | MEDICATED_PATCH | TRANSDERMAL | 0 refills | Status: DC

## 2018-12-06 NOTE — Telephone Encounter (Signed)
Copied from [place] #[id number]. Topic: Quick Communication - Rx Refill/Question >> Dec 06, 2018  2:00 PM Margot Ables wrote: Medication: fentaNYL ([place] - DOSED MCG/HR) 50 MCG/HR - put last one on today - please call pt to notify when RX is sent in  Has the patient contacted their pharmacy? yes - pharmacy advised her that she had to call and denied sending request  Preferred Pharmacy (with phone number or street name): [facility] (N), [place] [phone] (Phone) [phone] (Fax)

## 2018-12-06 NOTE — Telephone Encounter (Signed)
Fentanyl patch sent in.

## 2018-12-06 NOTE — Telephone Encounter (Signed)
Spoke with pt and informed her that the medication has been refilled. Pt gave a verbal understanding.

## 2018-12-06 NOTE — Telephone Encounter (Signed)
Refilled: 10/14/2018 Last OV: 07/21/2018 Next OV: not scheduled

## 2018-12-16 ENCOUNTER — Other Ambulatory Visit: Payer: Self-pay | Admitting: Internal Medicine

## 2018-12-18 ENCOUNTER — Telehealth: Payer: Self-pay | Admitting: Internal Medicine

## 2018-12-18 ENCOUNTER — Other Ambulatory Visit: Payer: Self-pay | Admitting: Internal Medicine

## 2018-12-18 ENCOUNTER — Other Ambulatory Visit: Payer: Self-pay

## 2018-12-18 MED ORDER — LISINOPRIL 20 MG PO TABS: 40.0000 mg | ORAL_TABLET | Freq: Two times a day (BID) | ORAL | 1 refills | Status: DC

## 2018-12-18 NOTE — Telephone Encounter (Signed)
Copied from [place] [id number]. Topic: Quick Communication - See Telephone Encounter >> Dec 18, 2018 10:11 AM Blase Mess A wrote: CRM for notification. See Telephone encounter for: 12/18/18.  Patient is calling because she received a letter from the [place] letting them know that she can only use Zetia. The letter is due by 12/23/18 Please advise [phone]

## 2018-12-18 NOTE — Telephone Encounter (Signed)
Prescriptions have been sent in.  °

## 2018-12-18 NOTE — Telephone Encounter (Signed)
Copied from [place] [id number]. Topic: Quick Communication - Rx Refill/Question >> Dec 18, 2018  8:31 AM Alfredia Ferguson R wrote: Medication: lisinopril (PRINIVIL,ZESTRIL) 20 MG tablet , hydrALAZINE (APRESOLINE) 50 MG tablet , ZETIA 10 MG tablet  Has the patient contacted their pharmacy? Yes  Preferred Pharmacy (with phone number or street name): [facility] (N), [place] - [place] [phone] (Phone) [phone] (Fax)    Agent: Please be advised that RX refills may take up to 3 business days. We ask that you follow-up with your pharmacy.

## 2018-12-19 NOTE — Telephone Encounter (Signed)
Spoke with pt and to let her know that the letter has been completed and to see if she has a fax number to send the letter to. Pt stated that she would find it and give [place] a call back.

## 2018-12-20 ENCOUNTER — Telehealth: Payer: Self-pay | Admitting: Internal Medicine

## 2018-12-20 ENCOUNTER — Telehealth: Payer: Self-pay | Admitting: *Deleted

## 2018-12-20 ENCOUNTER — Ambulatory Visit (INDEPENDENT_AMBULATORY_CARE_PROVIDER_SITE_OTHER): Payer: Medicare Other | Admitting: Internal Medicine

## 2018-12-20 ENCOUNTER — Encounter: Payer: Self-pay | Admitting: Internal Medicine

## 2018-12-20 VITALS — BP 124/60 | HR 77 | Temp 98.6°F | Resp 16 | Ht 61.0 in | Wt 152.6 lb

## 2018-12-20 DIAGNOSIS — I739 Peripheral vascular disease, unspecified: Secondary | ICD-10-CM

## 2018-12-20 DIAGNOSIS — M1611 Unilateral primary osteoarthritis, right hip: Secondary | ICD-10-CM | POA: Diagnosis not present

## 2018-12-20 DIAGNOSIS — M48062 Spinal stenosis, lumbar region with neurogenic claudication: Secondary | ICD-10-CM | POA: Diagnosis not present

## 2018-12-20 DIAGNOSIS — R059 Cough, unspecified: Secondary | ICD-10-CM

## 2018-12-20 DIAGNOSIS — R05 Cough: Secondary | ICD-10-CM

## 2018-12-20 DIAGNOSIS — J0100 Acute maxillary sinusitis, unspecified: Secondary | ICD-10-CM

## 2018-12-20 DIAGNOSIS — I10 Essential (primary) hypertension: Secondary | ICD-10-CM

## 2018-12-20 MED ORDER — LEVOFLOXACIN 500 MG PO TABS: 500.0000 mg | ORAL_TABLET | Freq: Every day | ORAL | 0 refills | Status: DC

## 2018-12-20 MED ORDER — METHYLPREDNISOLONE ACETATE 40 MG/ML IJ SUSP
40.0000 mg | Freq: Once | INTRAMUSCULAR | Status: AC
  Administered 2018-12-20: 40 mg via INTRAMUSCULAR

## 2018-12-20 MED ORDER — PREDNISONE 10 MG PO TABS: ORAL_TABLET | ORAL | 0 refills | Status: DC

## 2018-12-20 MED ORDER — BENZONATATE 200 MG PO CAPS: 200.0000 mg | ORAL_CAPSULE | Freq: Two times a day (BID) | ORAL | 0 refills | Status: DC | PRN

## 2018-12-20 MED ORDER — TRAMADOL HCL 50 MG PO TABS: ORAL_TABLET | ORAL | 3 refills | Status: DC

## 2018-12-20 NOTE — Telephone Encounter (Signed)
See previous message. Pt called back with fax number. Letter has been faxed.

## 2018-12-20 NOTE — Telephone Encounter (Signed)
Copied from [place] [id number]. Topic: Quick Communication - See Telephone Encounter >> Dec 20, 2018  5:08 PM Rutherford Nail, NT wrote: CRM for notification. See Telephone encounter for: 12/20/18. Patient calling and states that Dr Derrel Nip told her to schedule an appointment with Dr Delana Meyer. Patient states that the receptionist for that office called her and states that her next appointment is in December 2020. Patient  would like Dr Derrel Nip to be aware and to advised on how soon she would like this appointment to be done. Would like a call back once this is advised on. Please advise.

## 2018-12-20 NOTE — Telephone Encounter (Signed)
Copied from [place] [id number]. Topic: General - Other >> Dec 19, 2018  5:20 PM Windy Kalata wrote: Reason for CRM: Patient called with info for Janett Billow she states [place] review dept Fax [phone]

## 2018-12-20 NOTE — Telephone Encounter (Signed)
Form has been faxed.

## 2018-12-20 NOTE — Progress Notes (Signed)
Subjective:  Patient ID: Sabrina [name], female    DOB: [birth year]  Age: 78 y.o. MRN: [id number]  CC: The primary encounter diagnosis was Essential hypertension. Diagnoses of Cough, Peripheral vascular disease ([place]), Primary osteoarthritis of right hip, Lumbar stenosis with neurogenic claudication, and Subacute maxillary sinusitis were also pertinent to this visit.  HPI Sabrina [name] presents for evaluation of productive cough that has been present for ten days.  Concurrent with house remodelling    follow up on chronic issues including chronic leg and back pain , multifactorial, including PAD and DJD of lumbar and sacral spine.  Her pain is historically  managed with fentanyl and tramadol.  Patient states that her pain is getting worse and "Unbearable"  She has SMA stenosis and has been advised by Dr Delana Meyer to undergo angiography but she has deferred procedures.  She was advised to start a formal exercise program and at the very least,  Walk daily .  Cannot walk  Farther 300 feet before she has to rest to make the pain stop.    Reviewed prior MRI Lumbar spine 2017:  Unchanged Severe bilateral facet arthritis at L4-5 creating No possible L5 nerve root impingement   MRI cervical spine sept 2019: cord flattening from C3 to C6  And  biforaminal impingement and advance left.  Using one tramadol daily   Pain is  not controlled  Not sedated by fentanyl  But sleeping ok.  Using chickseed to manage constipation ,  Has a bm every other day   Toes became numb on the left for about 8 minutes while lying down. Woke her up.    Seeing heme onc for IDA   Outpatient Medications Prior to Visit  Medication Sig Dispense Refill  . aspirin 81 MG tablet Take 81 mg by mouth daily.    . chlordiazePOXIDE (LIBRIUM) 25 MG capsule TAKE ONE CAPSULE BY MOUTH ONCE DAILY AS NEEDED FOR ANXIETY 30 capsule 0  . clopidogrel (PLAVIX) 75 MG tablet TAKE 1 TABLET BY MOUTH ONCE DAILY 90 tablet 0  . fentaNYL (DURAGESIC -  DOSED MCG/HR) 50 MCG/HR Place 1 patch (50 mcg total) onto the skin every 3 (three) days. 10 patch 0  . fentaNYL (DURAGESIC) 50 MCG/HR Place 1 patch onto the skin every 3 (three) days. 10 patch 0  . hydrALAZINE (APRESOLINE) 50 MG tablet TAKE 1 TABLET BY MOUTH THREE TIMES DAILY AS NEEDED FOR BLOOD PRESSURE GREATER THAN 150 270 tablet 1  . labetalol (NORMODYNE) 200 MG tablet Take 1 tablet (200 mg total) by mouth 2 (two) times daily. 180 tablet 1  . lisinopril (PRINIVIL,ZESTRIL) 20 MG tablet Take 2 tablets (40 mg total) by mouth 2 (two) times daily. 360 tablet 1  . Multiple Vitamin (MULTIVITAMIN) tablet Take 1 tablet by mouth daily.    . Omega 3 1000 MG CAPS Take 1 capsule by mouth 2 (two) times daily.     . pantoprazole (PROTONIX) 40 MG tablet Take 1 tablet (40 mg total) by mouth daily. 30 tablet 3  . ZETIA 10 MG tablet TAKE 1 TABLET BY MOUTH ONCE DAILY 30 tablet 2  . traMADol (ULTRAM) 50 MG tablet TAKE TWO TABLETS BY MOUTH EVERY 8 HOURS AS NEEDED 60 tablet 3  . nortriptyline (PAMELOR) 10 MG capsule TAKE 1 CAPSULE BY MOUTH NIGHTLY FOR 1 WEEK THEN INCREASE TO 2 NIGHTLY FOR REFILLS USE RX [id number]  0   No facility-administered medications prior to visit.     Review of Systems;  Patient  denies headache, fevers, malaise, unintentional weight loss, skin rash, eye pain, sinus congestion and sinus pain, sore throat, dysphagia,  hemoptysis , cough, dyspnea, wheezing, chest pain, palpitations, orthopnea, edema, abdominal pain, nausea, melena, diarrhea, constipation, flank pain, dysuria, hematuria, urinary  Frequency, nocturia, numbness, tingling, seizures,  Focal weakness, Loss of consciousness,  Tremor, insomnia, depression, anxiety, and suicidal ideation.      Objective:  BP 124/60 (BP Location: Right Arm, Patient Position: Sitting, Cuff Size: Normal)   Pulse 77   Temp 98.6 F (37 C) (Oral)   Resp 16   Ht 5\' 1"  (1.549 m)   Wt 152 lb 9.6 oz (69.2 kg)   SpO2 97%   BMI 28.83 kg/m   BP Readings  from Last 3 Encounters:  12/20/18 124/60  10/13/18 (!) 151/77  09/14/18 94/61    Wt Readings from Last 3 Encounters:  12/20/18 152 lb 9.6 oz (69.2 kg)  10/13/18 158 lb 4 oz (71.8 kg)  09/14/18 155 lb (70.3 kg)    General appearance: alert, cooperative and appears stated age Ears: normal TM's and external ear canals both ears Throat: lips, mucosa, and tongue normal; teeth and gums normal Neck: no adenopathy, no carotid bruit, supple, symmetrical, trachea midline and thyroid not enlarged, symmetric, no tenderness/mass/nodules Back: symmetric, no curvature. ROM normal. No CVA tenderness. Lungs: clear to auscultation bilaterally Heart: regular rate and rhythm, S1, S2 normal, no murmur, click, rub or gallop Abdomen: soft, non-tender; bowel sounds normal; no masses,  no organomegaly Pulses: 2+ and symmetric Skin: Skin color, texture, turgor normal. No rashes or lesions Lymph nodes: Cervical, supraclavicular, and axillary nodes normal.  Lab Results  Component Value Date   HGBA1C 5.9 07/19/2016   HGBA1C 5.8 06/02/2015    Lab Results  Component Value Date   CREATININE 1.22 (H) 12/21/2018   CREATININE 1.10 02/22/2018   CREATININE 1.20 07/06/2017    Lab Results  Component Value Date   WBC 6.0 10/11/2018   HGB 11.0 (L) 10/11/2018   HCT 34.2 (L) 10/11/2018   PLT 242 10/11/2018   GLUCOSE 105 (H) 12/21/2018   CHOL 167 02/22/2018   TRIG 53.0 02/22/2018   HDL 54.80 02/22/2018   LDLDIRECT 109.0 03/05/2016   LDLCALC 102 (H) 02/22/2018   ALT 16 12/21/2018   AST 26 12/21/2018   NA 135 12/21/2018   K 5.0 12/21/2018   CL 102 12/21/2018   CREATININE 1.22 (H) 12/21/2018   BUN 24 (H) 12/21/2018   CO2 25 12/21/2018   TSH 4.46 03/06/2018   INR 1.0 03/27/2014   HGBA1C 5.9 07/19/2016    Mr Cervical Spine Wo Contrast  Result Date: 07/14/2018 CLINICAL DATA:  Degenerative disc disease and radiculopathy. Spinal osteoarthritis EXAM: MRI CERVICAL SPINE WITHOUT CONTRAST TECHNIQUE:  Multiplanar, multisequence MR imaging of the cervical spine was performed. No intravenous contrast was administered. COMPARISON:  08/11/2015 FINDINGS: Alignment: Physiologic. Vertebrae: No fracture, evidence of discitis, or bone lesion. Cord: Normal signal Posterior Fossa, vertebral arteries, paraspinal tissues: 10 mm right posterior thyroid nodule that was also seen in 2016. Disc levels: C2-3: Mild disc bulging.  No impingement C3-4: Disc narrowing and bulging with a central protrusion flattening the cord against the mildly thickened ligamentum flavum. Mild uncovertebral spurring. The foramina are patent C4-5: Disc narrowing and bulging with uncovertebral spurring greater on the left. There is central protrusion flattening the cord against the ligamentum flavum. Advanced left foraminal stenosis. C5-6: Disc narrowing and bulging with endplate and uncovertebral ridging. Disc protrusion flattens the cord. There is  biforaminal impingement C6-7: Disc narrowing and bulging with left uncovertebral spurring. Mild left foraminal stenosis. C7-T1:Unremarkable. IMPRESSION: 1. No significant change when compared to 2016. 2. There is spinal stenosis with cord flattening from disc protrusion at C3-4 to C5-6. 3. C4-5 left and C5-6 bilateral foraminal impingement. Electronically Signed   By: Monte Fantasia M.D.   On: 07/14/2018 15:00    Assessment & Plan:   Problem List Items Addressed This Visit    Sinusitis    Given chronicity of symptoms, development of facial pain and exam consistent with bacterial URI,  Will treat with empiric antibiotics, decongestants, prednisone taper, cough suppressant and saline lavage.   Probiotic advised       Relevant Medications   levofloxacin (LEVAQUIN) 500 MG tablet   predniSONE (DELTASONE) 10 MG tablet   benzonatate (TESSALON) 200 MG capsule   methylPREDNISolone acetate (DEPO-MEDROL) injection 40 mg (Completed)   Peripheral vascular disease ([place])    She has  Been offered  angioplasty by Dr Delana Meyer for PAD with claudication symtoms but declined  the procedure. Strongly encouraged her to reconsider sice her ambulation has been impacted       Osteoarthritis of right hip    Her pain is not well controlled on current regimen of fentanyl patch 50 mcg and tramadol once daily.  Advised to increase use of tramadol to goal 100 mg every 12 hours      Relevant Medications   traMADol (ULTRAM) 50 MG tablet   predniSONE (DELTASONE) 10 MG tablet   methylPREDNISolone acetate (DEPO-MEDROL) injection 40 mg (Completed)   Lumbar stenosis with neurogenic claudication    Chronic,  Secondary to 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  Continue fentanyl transdermal and oral tramadol  for pain management as she is a poor candidate for surgery and refuses pain management/ESI.  tramadol dose increased to 100 mg twice daily       Relevant Medications   traMADol (ULTRAM) 50 MG tablet   predniSONE (DELTASONE) 10 MG tablet   methylPREDNISolone acetate (DEPO-MEDROL) injection 40 mg (Completed)   HTN (hypertension) - Primary   Relevant Orders   Comprehensive metabolic panel (Completed)    Other Visit Diagnoses    Cough       Relevant Medications   methylPREDNISolone acetate (DEPO-MEDROL) injection 40 mg (Completed)      I have discontinued Tressia A. Pascua's nortriptyline. I have also changed her traMADol. Additionally, I am having her start on levofloxacin, predniSONE, and benzonatate. Lastly, I am having her maintain her aspirin, Omega 3, chlordiazePOXIDE, multivitamin, fentaNYL, pantoprazole, labetalol, fentaNYL, hydrALAZINE, ZETIA, lisinopril, and clopidogrel. We administered methylPREDNISolone acetate.  Meds ordered this encounter  Medications  . traMADol (ULTRAM) 50 MG tablet    Sig: TAKE TWO TABLETS BY MOUTH EVERY 12 HOURS AS NEEDED max 4 daily    Dispense:  120 tablet    Refill:  3  . levofloxacin (LEVAQUIN) 500 MG tablet    Sig:  Take 1 tablet (500 mg total) by mouth daily.    Dispense:  7 tablet    Refill:  0  . predniSONE (DELTASONE) 10 MG tablet    Sig: 6 tablets on Day 1 , then reduce by 1 tablet daily until gone    Dispense:  21 tablet    Refill:  0  . benzonatate (TESSALON) 200 MG capsule    Sig: Take 1 capsule (200 mg total) by mouth 2 (two) times daily as needed for cough.  Dispense:  20 capsule    Refill:  0  . methylPREDNISolone acetate (DEPO-MEDROL) injection 40 mg    Medications Discontinued During This Encounter  Medication Reason  . nortriptyline (PAMELOR) 10 MG capsule Patient has not taken in last 30 days  . traMADol (ULTRAM) 50 MG tablet     Follow-up: Return in about 3 months (around 03/20/2019).   Crecencio Mc, MD

## 2018-12-20 NOTE — Patient Instructions (Addendum)
1) For your hard stools/constipation:  Add colace ,  A stool softener  To your check seed   2) For your back pain:  Increase tramadol to  2 tablets every 12 hours  For one week trial.  Continue fentanyl patches  If pain is still not controlled after one week, call and I will increase the fentanyl patch to 75 mcg   3) This will not help the pain in your legs from poor circulation:  Please call Dr Delana Meyer and have the procedure to unblock your blood vessels in your lower legs.  THIS PROCEDURE DOES NOT REQUIRE REHAB   I am treating you for sinusitis which is a complication from your congestion .   I am prescribing an antibiotic (levaquin) and a prednisone taper  To manage the infection and the inflammation in your ear/sinuses.   Start the prednisone TOMORROW MORNING:  6 tablets all at once on Day 1,  Then taper by 1 tablet daily until gone  I also advise use of the following OTC meds to help with your other symptoms.   Take generic OTC benadryl 25 mg every 8 hours for the drainage,  Sudafed PE  10 to 30 mg every 8 hours for the congestion, you may substitute Afrin nasal spray for the nighttime dose of sudafed PE  If needed to prevent insomnia.  flush your sinuses twice daily with Simply Saline (do over the sink because if you do it right you will spit out globs of mucus)  Use benzonatate capsules   FOR THE daytime COUGH.  Please take a probiotic ( Align, Floraque or Culturelle) while you are on the antibiotic to prevent  the  serious antibiotic associated diarrhea  Called clostridium dificile colitis and a vaginal yeast infection

## 2018-12-21 ENCOUNTER — Other Ambulatory Visit (INDEPENDENT_AMBULATORY_CARE_PROVIDER_SITE_OTHER): Payer: Medicare Other

## 2018-12-21 ENCOUNTER — Telehealth: Payer: Self-pay | Admitting: Internal Medicine

## 2018-12-21 DIAGNOSIS — I10 Essential (primary) hypertension: Secondary | ICD-10-CM

## 2018-12-21 NOTE — Telephone Encounter (Signed)
No , that is not appropriate.  she needs to tell the receptionist that Dr Delana Meyer advised her to have a procedure at her last visit and she has decided to have it done.

## 2018-12-21 NOTE — Telephone Encounter (Signed)
Pt states that the cough medication that was prescribed did not seem to work.   benzonatate (TESSALON) 200 MG capsule  Please advise?   Please call pt at [phone].

## 2018-12-21 NOTE — Telephone Encounter (Signed)
Does pt need to be seen sooner than 09/2019 by Dr. Delana Meyer?

## 2018-12-22 LAB — COMPREHENSIVE METABOLIC PANEL
ALT: 16 U/L (ref 0–35)
AST: 26 U/L (ref 0–37)
Albumin: 4 g/dL (ref 3.5–5.2)
Alkaline Phosphatase: 70 U/L (ref 39–117)
BUN: 24 mg/dL — ABNORMAL HIGH (ref 6–23)
CO2: 25 mEq/L (ref 19–32)
Calcium: 9.5 mg/dL (ref 8.4–10.5)
Chloride: 102 mEq/L (ref 96–112)
Creatinine, Ser: 1.22 mg/dL — ABNORMAL HIGH (ref 0.40–1.20)
GFR: 51.74 mL/min — ABNORMAL LOW (ref >60.00)
Glucose, Bld: 105 mg/dL — ABNORMAL HIGH (ref 70–99)
Potassium: 5 mEq/L (ref 3.5–5.1)
Sodium: 135 mEq/L (ref 135–145)
Total Bilirubin: 0.4 mg/dL (ref 0.2–1.2)
Total Protein: 7.5 g/dL (ref 6.0–8.3)

## 2018-12-22 NOTE — Telephone Encounter (Signed)
I cannot,  Because she is taking narcotics ,  She can try delsym or robitussin

## 2018-12-22 NOTE — Telephone Encounter (Signed)
Is there something else that can be called in for the cough. Pt stated that the benzonatate did not help.

## 2018-12-22 NOTE — Telephone Encounter (Signed)
Patient was advised.  

## 2018-12-23 DIAGNOSIS — J329 Chronic sinusitis, unspecified: Secondary | ICD-10-CM | POA: Insufficient documentation

## 2018-12-23 MED ORDER — LOSARTAN POTASSIUM 100 MG PO TABS: 100.0000 mg | ORAL_TABLET | Freq: Every day | ORAL | 0 refills | Status: DC

## 2018-12-23 NOTE — Assessment & Plan Note (Signed)
Given chronicity of symptoms, development of facial pain and exam consistent with bacterial URI,  Will treat with empiric antibiotics, decongestants, prednisone taper, cough suppressant and saline lavage.   Probiotic advised   

## 2018-12-23 NOTE — Assessment & Plan Note (Signed)
Her pain is not well controlled on current regimen of fentanyl patch 50 mcg and tramadol once daily.  Advised to increase use of tramadol to goal 100 mg every 12 hours

## 2018-12-23 NOTE — Assessment & Plan Note (Signed)
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  Continue fentanyl transdermal and oral tramadol  for pain management as she is a poor candidate for surgery and refuses pain management/ESI.  tramadol dose increased to 100 mg twice daily

## 2018-12-23 NOTE — Addendum Note (Signed)
Addended by: Crecencio Mc on: 12/23/2018 06:42 PM   Modules accepted: Orders

## 2018-12-23 NOTE — Assessment & Plan Note (Signed)
She has  Been offered angioplasty by Dr Delana Meyer for PAD with claudication symtoms but declined  the procedure. Strongly encouraged her to reconsider sice her ambulation has been impacted

## 2018-12-25 NOTE — Telephone Encounter (Signed)
Called pt to let her know that Dr. Derrel Nip could not call in anything else for her cough because of the narcotics that she is already taking. The pt gave a verbal understanding and stated that the cough is getting better.

## 2018-12-26 NOTE — Telephone Encounter (Signed)
Zetia has been approved through 10/25/2019. Pt is aware.

## 2019-01-09 ENCOUNTER — Telehealth: Payer: Self-pay | Admitting: Internal Medicine

## 2019-01-09 DIAGNOSIS — I10 Essential (primary) hypertension: Secondary | ICD-10-CM

## 2019-01-09 MED ORDER — LABETALOL HCL 200 MG PO TABS: 200.0000 mg | ORAL_TABLET | Freq: Two times a day (BID) | ORAL | 1 refills | Status: DC

## 2019-01-09 NOTE — Telephone Encounter (Signed)
Copied from [place] [id number]. Topic: Quick Communication - Rx Refill/Question >> Jan 09, 2019  3:08 PM Alanda Slim E wrote: Medication: labetalol (NORMODYNE) 200 MG tablet  Has the patient contacted their pharmacy?   Preferred Pharmacy (with phone number or street name): [facility] ([facility]), [place] - [place] [phone] (Phone) [phone] (Fax)    Agent: Please be advised that RX refills may take up to 3 business days. We ask that you follow-up with your pharmacy.

## 2019-01-09 NOTE — Telephone Encounter (Signed)
Medication refilled

## 2019-01-10 ENCOUNTER — Inpatient Hospital Stay: Payer: Medicare Other

## 2019-01-12 ENCOUNTER — Inpatient Hospital Stay: Payer: Medicare Other | Admitting: Oncology

## 2019-01-17 ENCOUNTER — Other Ambulatory Visit: Payer: Self-pay | Admitting: Internal Medicine

## 2019-01-17 MED ORDER — FENTANYL 50 MCG/HR TD PT72: 1.0000 | MEDICATED_PATCH | TRANSDERMAL | 0 refills | Status: DC

## 2019-01-17 NOTE — Telephone Encounter (Signed)
Copied from [place] [phone]. Topic: Quick Communication - Rx Refill/Question >> Jan 17, 2019  3:01 PM Sheran Luz wrote: Medication: fentaNYL (DURAGESIC) 50 MCG/HR   Patient is requesting a refill of this medication.   Preferred Pharmacy (with phone number or street name):[facility] ([facility]), [place] - [place] [phone] (Phone) [phone] (Fax)

## 2019-01-17 NOTE — Telephone Encounter (Signed)
Refilled: 12/06/2018 Last OV: 12/20/2018 Next OV: 03/21/2019

## 2019-01-22 DIAGNOSIS — H0016 Chalazion left eye, unspecified eyelid: Secondary | ICD-10-CM | POA: Diagnosis not present

## 2019-01-25 DIAGNOSIS — B0230 Zoster ocular disease, unspecified: Secondary | ICD-10-CM | POA: Diagnosis not present

## 2019-01-30 DIAGNOSIS — B0230 Zoster ocular disease, unspecified: Secondary | ICD-10-CM | POA: Diagnosis not present

## 2019-02-05 DIAGNOSIS — B0230 Zoster ocular disease, unspecified: Secondary | ICD-10-CM | POA: Diagnosis not present

## 2019-02-13 DIAGNOSIS — R51 Headache: Secondary | ICD-10-CM | POA: Diagnosis not present

## 2019-02-18 ENCOUNTER — Other Ambulatory Visit: Payer: Self-pay

## 2019-02-19 ENCOUNTER — Inpatient Hospital Stay: Payer: Medicare Other | Attending: Oncology

## 2019-02-19 ENCOUNTER — Other Ambulatory Visit: Payer: Self-pay

## 2019-02-19 DIAGNOSIS — D509 Iron deficiency anemia, unspecified: Secondary | ICD-10-CM | POA: Insufficient documentation

## 2019-02-19 LAB — IRON AND TIBC
Iron: 70 ug/dL (ref 28–170)
Saturation Ratios: 22 % (ref 10.4–31.8)
TIBC: 320 ug/dL (ref 250–450)
UIBC: 251 ug/dL

## 2019-02-19 LAB — CBC WITH DIFFERENTIAL/PLATELET
Abs Immature Granulocytes: 0.01 10*3/uL (ref 0.00–0.07)
Basophils Absolute: 0 10*3/uL (ref 0.0–0.1)
Basophils Relative: 1 %
Eosinophils Absolute: 0.4 10*3/uL (ref 0.0–0.5)
Eosinophils Relative: 9 %
HCT: 34.4 % — ABNORMAL LOW (ref 36.0–46.0)
Hemoglobin: 11.3 g/dL — ABNORMAL LOW (ref 12.0–15.0)
Immature Granulocytes: 0 %
Lymphocytes Relative: 34 %
Lymphs Abs: 1.5 10*3/uL (ref 0.7–4.0)
MCH: 29.2 pg (ref 26.0–34.0)
MCHC: 32.8 g/dL (ref 30.0–36.0)
MCV: 88.9 fL (ref 80.0–100.0)
Monocytes Absolute: 0.4 10*3/uL (ref 0.1–1.0)
Monocytes Relative: 10 %
Neutro Abs: 2.2 10*3/uL (ref 1.7–7.7)
Neutrophils Relative %: 46 %
Platelets: 212 10*3/uL (ref 150–400)
RBC: 3.87 MIL/uL (ref 3.87–5.11)
RDW: 13.6 % (ref 11.5–15.5)
WBC: 4.6 10*3/uL (ref 4.0–10.5)
nRBC: 0 % (ref 0.0–0.2)

## 2019-02-19 LAB — FERRITIN: Ferritin: 119 ng/mL (ref 11–307)

## 2019-02-21 ENCOUNTER — Inpatient Hospital Stay (HOSPITAL_BASED_OUTPATIENT_CLINIC_OR_DEPARTMENT_OTHER): Payer: Medicare Other | Admitting: Oncology

## 2019-02-21 ENCOUNTER — Encounter: Payer: Self-pay | Admitting: Oncology

## 2019-02-21 ENCOUNTER — Other Ambulatory Visit: Payer: Self-pay | Admitting: Internal Medicine

## 2019-02-21 ENCOUNTER — Other Ambulatory Visit: Payer: Self-pay

## 2019-02-21 DIAGNOSIS — N183 Chronic kidney disease, stage 3 unspecified: Secondary | ICD-10-CM

## 2019-02-21 DIAGNOSIS — D631 Anemia in chronic kidney disease: Secondary | ICD-10-CM | POA: Diagnosis not present

## 2019-02-21 DIAGNOSIS — D509 Iron deficiency anemia, unspecified: Secondary | ICD-10-CM | POA: Diagnosis not present

## 2019-02-21 NOTE — Progress Notes (Signed)
Patient contacted for virtual visit. No new concerns voiced.

## 2019-02-21 NOTE — Progress Notes (Signed)
HEMATOLOGY-ONCOLOGY TeleHEALTH VISIT PROGRESS NOTE  I connected with Sabrina [name] on 02/21/19 at 10:30 AM EDT by telephone visit and verified that I am speaking with the correct person using two identifiers. I discussed the limitations, risks, security and privacy concerns of performing an evaluation and management service by telemedicine and the availability of in-person appointments. I also discussed with the patient that there may be a patient responsible charge related to this service. The patient expressed understanding and agreed to proceed.   Other persons participating in the visit and their role in the encounter:  Geraldine Solar, [place], check in patient   Janeann Merl, RN, check in patient.   Patient's location: Home  Provider's location: home Chief Complaint: follow up for management of anemia.    INTERVAL HISTORY Sabrina [name] is a 78 y.o. female who has above history reviewed by me today presents for follow up visit for management of anemia.  Problems and complaints are listed below: patient has no new complaints. Reports feeling well.    Review of Systems  Constitutional: Negative for appetite change, chills, fatigue and fever.  HENT:   Negative for hearing loss and voice change.   Eyes: Negative for eye problems.  Respiratory: Negative for chest tightness and cough.   Cardiovascular: Negative for chest pain.  Gastrointestinal: Negative for abdominal distention, abdominal pain and blood in stool.  Endocrine: Negative for hot flashes.  Genitourinary: Negative for difficulty urinating and frequency.   Musculoskeletal: Negative for arthralgias.  Skin: Negative for itching and rash.  Neurological: Negative for extremity weakness.  Hematological: Negative for adenopathy.  Psychiatric/Behavioral: Negative for confusion.    Past Medical History:  Diagnosis Date  . Anemia of chronic kidney failure   . Anxiety   . CAD (coronary artery disease)   . Carotid artery  stenosis   . Chronic kidney disease, stage III (moderate) (HCC)    Followed by Dr. Juleen China  . Hyperlipidemia   . Hypertension   . Renal artery stenosis ([place])   . Secondary hyperparathyroidism ([place])   . 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 Subclavian arterial stenosis [facility])    Past Surgical History:  Procedure Laterality Date  . ABDOMINAL HYSTERECTOMY  1976  . CAROTID ARTERY ANGIOPLASTY Left   . CAROTID ENDARTERECTOMY Left   . CHOLECYSTECTOMY    . COLONOSCOPY WITH PROPOFOL N/A 08/16/2017   Procedure: COLONOSCOPY WITH PROPOFOL;  Surgeon: [name], MD;  Location: [facility] ENDOSCOPY;  Service: Endoscopy;  Laterality: N/A;  . CORONARY ANGIOPLASTY WITH STENT PLACEMENT  2000  . CORONARY ARTERY BYPASS GRAFT  2000  . CYSTOSCOPY WITH STENT PLACEMENT Bilateral   . ESOPHAGOGASTRODUODENOSCOPY (EGD) WITH PROPOFOL N/A 08/16/2017   Procedure: ESOPHAGOGASTRODUODENOSCOPY (EGD) WITH PROPOFOL;  Surgeon: [name], MD;  Location: [facility] ENDOSCOPY;  Service: Endoscopy;  Laterality: N/A;  . ESOPHAGOGASTRODUODENOSCOPY (EGD) WITH PROPOFOL N/A 06/29/2018   Procedure: ESOPHAGOGASTRODUODENOSCOPY (EGD) WITH PROPOFOL;  Surgeon: Virgel Manifold, MD;  Location: [facility] ENDOSCOPY;  Service: Endoscopy;  Laterality: N/A;  . JOINT REPLACEMENT Bilateral 2000  . RENAL ARTERY ANGIOPLASTY Bilateral mARCH 2015  . TOE AMPUTATION Right    small toe  . TONSILLECTOMY AND ADENOIDECTOMY    . TOTAL HIP ARTHROPLASTY Left   . TOTAL HIP ARTHROPLASTY Right 2015    Family History  Problem Relation Age of Onset  . Stroke Mother   . Hypertension Mother   . Diabetes Mother   . Hypertension Father   . Heart disease Sister  MI  . Multiple sclerosis Daughter   . Multiple sclerosis Son   . Cerebral aneurysm Son   . Seizures Son   . Cerebral aneurysm Son   . Breast cancer Paternal Aunt 69    Social History   Socioeconomic History  . Marital status: Divorced    Spouse name: Not on file  . Number of  children: Not on file  . Years of education: Not on file  . Highest education level: Not on file  Occupational History  . Occupation: retired  Scientific laboratory technician  . Financial resource strain: Not hard at all  . Food insecurity:    Worry: Never true    Inability: Never true  . Transportation needs:    Medical: No    Non-medical: No  Tobacco Use  . Smoking status: Former Smoker    Last attempt to quit: 10/25/1998    Years since quitting: 20.3  . Smokeless tobacco: Never Used  Substance and Sexual Activity  . Alcohol use: No    Alcohol/week: 0.0 standard drinks  . Drug use: Never  . Sexual activity: Not Currently  Lifestyle  . Physical activity:    Days per week: 0 days    Minutes per session: 0 min  . Stress: Patient refused  Relationships  . Social connections:    Talks on phone: Patient refused    Gets together: Patient refused    Attends religious service: Patient refused    Active member of club or organization: Patient refused    Attends meetings of clubs or organizations: Patient refused    Relationship status: Patient refused  . Intimate partner violence:    Fear of current or ex partner: No    Emotionally abused: No    Physically abused: No    Forced sexual activity: No  Other Topics Concern  . Not on file  Social History Narrative  . Not on file    Current Outpatient Medications on File Prior to Visit  Medication Sig Dispense Refill  . aspirin 81 MG tablet Take 81 mg by mouth daily.    . benzonatate (TESSALON) 200 MG capsule Take 1 capsule (200 mg total) by mouth 2 (two) times daily as needed for cough. 20 capsule 0  . chlordiazePOXIDE (LIBRIUM) 25 MG capsule TAKE ONE CAPSULE BY MOUTH ONCE DAILY AS NEEDED FOR ANXIETY 30 capsule 0  . fentaNYL (DURAGESIC - DOSED MCG/HR) 50 MCG/HR Place 1 patch (50 mcg total) onto the skin every 3 (three) days. 10 patch 0  . hydrALAZINE (APRESOLINE) 50 MG tablet TAKE 1 TABLET BY MOUTH THREE TIMES DAILY AS NEEDED FOR BLOOD PRESSURE  GREATER THAN 150 270 tablet 1  . labetalol (NORMODYNE) 200 MG tablet Take 1 tablet (200 mg total) by mouth 2 (two) times daily. 180 tablet 1  . Multiple Vitamin (MULTIVITAMIN) tablet Take 1 tablet by mouth daily.    . Omega 3 1000 MG CAPS Take 1 capsule by mouth 2 (two) times daily.     . traMADol (ULTRAM) 50 MG tablet TAKE TWO TABLETS BY MOUTH EVERY 12 HOURS AS NEEDED max 4 daily 120 tablet 3  . ZETIA 10 MG tablet TAKE 1 TABLET BY MOUTH ONCE DAILY 30 tablet 2  . fentaNYL (DURAGESIC) 50 MCG/HR Place 1 patch onto the skin every 3 (three) days. (Patient not taking: Reported on 02/21/2019) 10 patch 0  . levofloxacin (LEVAQUIN) 500 MG tablet Take 1 tablet (500 mg total) by mouth daily. (Patient not taking: Reported on 02/21/2019) 7 tablet  0  . losartan (COZAAR) 100 MG tablet Take 1 tablet (100 mg total) by mouth daily. (Patient not taking: Reported on 02/21/2019) 90 tablet 0  . pantoprazole (PROTONIX) 40 MG tablet Take 1 tablet (40 mg total) by mouth daily. (Patient not taking: Reported on 02/21/2019) 30 tablet 3  . predniSONE (DELTASONE) 10 MG tablet 6 tablets on Day 1 , then reduce by 1 tablet daily until gone (Patient not taking: Reported on 02/21/2019) 21 tablet 0   No current facility-administered medications on file prior to visit.     Allergies  Allergen Reactions  . Citalopram     Throat closing   . Dilaudid [Hydromorphone Hcl] Nausea And Vomiting  . Hydrochlorothiazide Other (See Comments)    Decreased GFR (Nov 2015)  . Nsaids     CKD stage III - avoid nephrotoxic drugs  . Nubain [Nalbuphine Hcl]     Burning sensation in back  . Penicillins Itching  . Prasugrel Itching  . Statins Itching       Observations/Objective: There were no vitals filed for this visit. There is no height or weight on file to calculate BMI.  Physical Exam  CBC    Component Value Date/Time   WBC 4.6 02/19/2019 1136   RBC 3.87 02/19/2019 1136   HGB 11.3 (L) 02/19/2019 1136   HGB 9.2 (L) 04/13/2014 0952    HCT 34.4 (L) 02/19/2019 1136   HCT 24.7 (L) 04/12/2014 1143   PLT 212 02/19/2019 1136   PLT 182 04/11/2014 0558   MCV 88.9 02/19/2019 1136   MCV 85 03/27/2014 0944   MCH 29.2 02/19/2019 1136   MCHC 32.8 02/19/2019 1136   RDW 13.6 02/19/2019 1136   RDW 13.7 03/27/2014 0944   LYMPHSABS 1.5 02/19/2019 1136   MONOABS 0.4 02/19/2019 1136   EOSABS 0.4 02/19/2019 1136   BASOSABS 0.0 02/19/2019 1136    CMP     Component Value Date/Time   NA 135 12/21/2018 1434   NA 139 09/13/2014   NA 134 (L) 04/12/2014 0558   K 5.0 12/21/2018 1434   K 4.4 04/12/2014 0558   CL 102 12/21/2018 1434   CL 101 04/12/2014 0558   CO2 25 12/21/2018 1434   CO2 27 04/12/2014 0558   GLUCOSE 105 (H) 12/21/2018 1434   GLUCOSE 90 04/12/2014 0558   BUN 24 (H) 12/21/2018 1434   BUN 18 09/13/2014   BUN 16 04/12/2014 0558   CREATININE 1.22 (H) 12/21/2018 1434   CREATININE 1.12 04/12/2014 0558   CALCIUM 9.5 12/21/2018 1434   CALCIUM 8.5 04/12/2014 0558   PROT 7.5 12/21/2018 1434   PROT 8.2 01/30/2012 0118   ALBUMIN 4.0 12/21/2018 1434   ALBUMIN 3.9 01/30/2012 0118   AST 26 12/21/2018 1434   AST 31 01/30/2012 0118   ALT 16 12/21/2018 1434   ALT 22 01/30/2012 0118   ALKPHOS 70 12/21/2018 1434   ALKPHOS 79 01/30/2012 0118   BILITOT 0.4 12/21/2018 1434   BILITOT 0.5 01/30/2012 0118   GFRNONAA 44 (L) 02/07/2017 1627   GFRNONAA 49 (L) 04/12/2014 0558   GFRAA 51 (L) 02/07/2017 1627   GFRAA 57 (L) 04/12/2014 0558     Assessment and Plan: 1. Iron deficiency anemia, unspecified iron deficiency anemia type   2. Stage 3 chronic kidney disease ([place])   3. Anemia due to stage 3 chronic kidney disease ([place])     Labs reviewed and discussed with patient. Anemia level stable.  Hemoglobin 11.3. Discussed with patient that I recommend  patient continue take oral iron supplementation.  Follow Up Instructions: 3 months with repeat labs MD assessment.   I discussed the assessment and treatment plan with the  patient. The patient was provided an opportunity to ask questions and all were answered. The patient agreed with the plan and demonstrated an understanding of the instructions.  The patient was advised to call back or seek an in-person evaluation if the symptoms worsen or if the condition fails to improve as anticipated.   I provided 11 minutes of non face-to-face telephone visit time during this encounter, and > 50% was spent counseling as documented under my assessment & plan.  Earlie Server, MD 02/21/2019 9:41 PM

## 2019-03-07 ENCOUNTER — Telehealth: Payer: Self-pay | Admitting: Internal Medicine

## 2019-03-07 NOTE — Telephone Encounter (Signed)
Copied from [place] [id number]. Topic: Quick Communication - Rx Refill/Question >> Mar 07, 2019  3:38 PM Wynetta Emery, [place] C wrote: Medication:   fentaNYL ([place]) 50 MCG/HR--- pt says that she was told by pharmacy that they cant fax Rx, pt would like to be advised on what should she do?   Has the patient contacted their pharmacy? Yes  (Agent: If no, request that the patient contact the pharmacy for the refill.) (Agent: If yes, when and what did the pharmacy advise?)  Preferred Pharmacy (with phone number or street name): [facility] ([facility]), [place] - [place] [phone] (Phone) [phone] (Fax)    Agent: Please be advised that RX refills may take up to 3 business days. We ask that you follow-up with your pharmacy.

## 2019-03-08 ENCOUNTER — Telehealth: Payer: Self-pay

## 2019-03-08 MED ORDER — FENTANYL 50 MCG/HR TD PT72: 1.0000 | MEDICATED_PATCH | TRANSDERMAL | 0 refills | Status: DC

## 2019-03-08 NOTE — Telephone Encounter (Signed)
Pt following up on refill request and she is on her last patch. Pt requesting asap please  pt requesting a call back when done.

## 2019-03-08 NOTE — Telephone Encounter (Signed)
Sent a refill request to provider to write a new RX for patches.  Nina,cma

## 2019-03-08 NOTE — Telephone Encounter (Signed)
Fentanyl refilled and sent to [facility] . Please schedule a 3 month follow up when due

## 2019-03-09 NOTE — Telephone Encounter (Signed)
Called pt and informed her that her medication was sent to pharmacy, pt has a f/up scheduled for 03/21/2019, do you want to keep that one or move it out 3 months from today?

## 2019-03-09 NOTE — Telephone Encounter (Signed)
Ok thank you.  Melany Wiesman,cma

## 2019-03-09 NOTE — Telephone Encounter (Signed)
She needs to keep the may 27 visit

## 2019-03-21 ENCOUNTER — Other Ambulatory Visit: Payer: Self-pay

## 2019-03-21 ENCOUNTER — Encounter: Payer: Self-pay | Admitting: Internal Medicine

## 2019-03-21 ENCOUNTER — Ambulatory Visit (INDEPENDENT_AMBULATORY_CARE_PROVIDER_SITE_OTHER): Payer: Medicare Other | Admitting: Internal Medicine

## 2019-03-21 DIAGNOSIS — Z7189 Other specified counseling: Secondary | ICD-10-CM | POA: Diagnosis not present

## 2019-03-21 DIAGNOSIS — M48062 Spinal stenosis, lumbar region with neurogenic claudication: Secondary | ICD-10-CM

## 2019-03-21 DIAGNOSIS — M5416 Radiculopathy, lumbar region: Secondary | ICD-10-CM

## 2019-03-21 MED ORDER — FENTANYL 50 MCG/HR TD PT72: 1.0000 | MEDICATED_PATCH | TRANSDERMAL | 0 refills | Status: DC

## 2019-03-21 NOTE — Progress Notes (Signed)
Virtual Visit converted to telephone  Note  This visit type was conducted due to national recommendations for restrictions regarding the COVID-19 pandemic (e.g. social distancing).  This format is felt to be most appropriate for this patient at this time.  All issues noted in this document were discussed and addressed.  No physical exam was performed (except for noted visual exam findings with Video Visits).   I attempted to connect  with@ on 03/21/19 at  2:00 PM EDT by a video enabled telemedicine application  and verified that I am speaking with the correct person using two identifiers. However, Interactive audio and video telecommunications ultimately failed, due to patient having technical problems .  We continued and completed visit with audio only.   Location patient: home Location provider: work or home office Persons participating in the virtual visit: patient, provider  I discussed the limitations, risks, security and privacy concerns of performing an evaluation and management service by telephone and the availability of in person appointments. I also discussed with the patient that there may be a patient responsible charge related to this service. The patient expressed understanding and agreed to proceed.  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  States that her pain is getting worse and constant ,  And she states that she is ready to see a specialist   The patient has no signs or symptoms of COVID 19 infection (fever, cough, sore throat  or shortness of breath beyond what is typical for patient).  Patient denies contact with other persons with the above mentioned symptoms or with anyone confirmed to have COVID 19 .  She has been minimizing her contact with the public and using a mask and hand sanitizer when she comes into any contact with  the public.   Constipation  Managed with stool softeners   HTN :she notes improved readings since she stopped using her margarine that she realized was loaded with salt  ROS: Patient denies headache, fevers, malaise, unintentional weight loss, skin rash, eye pain, sinus congestion and sinus pain, sore throat, dysphagia,  hemoptysis , cough, dyspnea, wheezing, chest pain, palpitations, orthopnea, edema, abdominal pain, nausea, melena, diarrhea, constipation, flank pain, dysuria, hematuria, urinary  Frequency, nocturia, numbness, tingling, seizures,  Focal weakness, Loss of consciousness,  Tremor, insomnia, depression, anxiety, and suicidal ideation.      Past Medical History:  Diagnosis Date  . Anemia of chronic kidney failure   . Anxiety   . CAD (coronary artery disease)   . Carotid artery stenosis   . Chronic kidney disease, stage III (moderate) (HCC)    Followed by Dr. Juleen China  . Hyperlipidemia   . Hypertension   . Renal artery stenosis ([place])   . Secondary hyperparathyroidism ([place])   . 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 Subclavian arterial stenosis [facility])     Past Surgical History:  Procedure Laterality Date  . ABDOMINAL HYSTERECTOMY  1976  . CAROTID ARTERY ANGIOPLASTY Left   . CAROTID ENDARTERECTOMY Left   . CHOLECYSTECTOMY    . COLONOSCOPY WITH PROPOFOL N/A 08/16/2017   Procedure: COLONOSCOPY WITH PROPOFOL;  Surgeon: [name], MD;  Location: [facility] ENDOSCOPY;  Service: Endoscopy;  Laterality: N/A;  . CORONARY ANGIOPLASTY WITH STENT PLACEMENT  2000  . CORONARY ARTERY BYPASS GRAFT  2000  . CYSTOSCOPY WITH STENT PLACEMENT Bilateral   .  ESOPHAGOGASTRODUODENOSCOPY (EGD) WITH PROPOFOL N/A 08/16/2017   Procedure: ESOPHAGOGASTRODUODENOSCOPY (EGD) WITH PROPOFOL;  Surgeon: [name], MD;  Location: [facility] ENDOSCOPY;  Service: Endoscopy;  Laterality: N/A;  . ESOPHAGOGASTRODUODENOSCOPY (EGD) WITH PROPOFOL N/A 06/29/2018   Procedure: ESOPHAGOGASTRODUODENOSCOPY  (EGD) WITH PROPOFOL;  Surgeon: Virgel Manifold, MD;  Location: [facility] ENDOSCOPY;  Service: Endoscopy;  Laterality: N/A;  . JOINT REPLACEMENT Bilateral 2000  . RENAL ARTERY ANGIOPLASTY Bilateral mARCH 2015  . TOE AMPUTATION Right    small toe  . TONSILLECTOMY AND ADENOIDECTOMY    . TOTAL HIP ARTHROPLASTY Left   . TOTAL HIP ARTHROPLASTY Right 2015    Family History  Problem Relation Age of Onset  . Stroke Mother   . Hypertension Mother   . Diabetes Mother   . Hypertension Father   . Heart disease Sister        MI  . Multiple sclerosis Daughter   . Multiple sclerosis Son   . Cerebral aneurysm Son   . Seizures Son   . Cerebral aneurysm Son   . Breast cancer Paternal Aunt 71    SOCIAL HX:  reports that she quit smoking about 20 years ago. She has never used smokeless tobacco. She reports that she does not drink alcohol or use drugs.   Current Outpatient Medications:  .  aspirin 81 MG tablet, Take 81 mg by mouth daily., Disp: , Rfl:  .  chlordiazePOXIDE (LIBRIUM) 25 MG capsule, TAKE ONE CAPSULE BY MOUTH ONCE DAILY AS NEEDED FOR ANXIETY, Disp: 30 capsule, Rfl: 0 .  clopidogrel (PLAVIX) 75 MG tablet, Take 1 tablet by mouth once daily, Disp: 90 tablet, Rfl: 1 .  fentaNYL (DURAGESIC) 50 MCG/HR, Place 1 patch onto the skin every 3 (three) days., Disp: 10 patch, Rfl: 0 .  fentaNYL (DURAGESIC) 50 MCG/HR, Place 1 patch onto the skin every 3 (three) days., Disp: 10 patch, Rfl: 0 .  hydrALAZINE (APRESOLINE) 50 MG tablet, TAKE 1 TABLET BY MOUTH THREE TIMES DAILY AS NEEDED FOR BLOOD PRESSURE GREATER THAN 150, Disp: 270 tablet, Rfl: 1 .  labetalol (NORMODYNE) 200 MG tablet, Take 1 tablet (200 mg total) by mouth 2 (two) times daily., Disp: 180 tablet, Rfl: 1 .  Multiple Vitamin (MULTIVITAMIN) tablet, Take 1 tablet by mouth daily., Disp: , Rfl:  .  Omega 3 1000 MG CAPS, Take 1 capsule by mouth 2 (two) times daily. , Disp: , Rfl:  .  pantoprazole (PROTONIX) 40 MG tablet, Take 1 tablet (40 mg  total) by mouth daily., Disp: 30 tablet, Rfl: 3 .  traMADol (ULTRAM) 50 MG tablet, TAKE TWO TABLETS BY MOUTH EVERY 12 HOURS AS NEEDED max 4 daily, Disp: 120 tablet, Rfl: 3 .  ZETIA 10 MG tablet, TAKE 1 TABLET BY MOUTH ONCE DAILY, Disp: 30 tablet, Rfl: 2  EXAM:   General impression: alert, cooperative and articulate.  No signs of being in distress  Lungs: speech is fluent sentence length suggests that patient is not short of breath and not punctuated by cough, sneezing or sniffing. Marland Kitchen   Psych: affect normal.  speech is articulate and non pressured .  Denies suicidal thoughts    ASSESSMENT AND PLAN:  Discussed the following assessment and plan:  No diagnosis found.  No problem-specific Assessment & Plan notes found for this encounter.    I discussed the assessment and treatment plan with the patient. The patient was provided an opportunity to ask questions and all were answered. The patient agreed with the plan and demonstrated an understanding of  the instructions.   The patient was advised to call back or seek an in-person evaluation if the symptoms worsen or if the condition fails to improve as anticipated.  I provided 25 minutes of non-face-to-face time during this encounter.   Sabrina Mc, MD

## 2019-03-21 NOTE — Assessment & Plan Note (Addendum)
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  Refill history confirmed via [place] Controlled Substance databas, accessed by me today..Continue fentanyl transdermal 50 mcg and oral tramadol 100 mg q 12 hours   for pain management until she discloses the name of the specialist she would like to see  .

## 2019-03-21 NOTE — Assessment & Plan Note (Signed)
Educated patient on the newly broadened list of signs and symptoms of COVID-19 infection and ways to avoid the viral infection including washing hands frequently with soap and water,  using hand sanitizer if unable to wash, avoiding touching face,  staying at home and limiting visitors,  and avoiding contact with people coming in and out of home.  Discussed the potential ineffectiveness of hand sanitizer if left in environments > 110 degrees (ie , the car).  Reminded patient to call office with questions/concerns.  The importance of social distancing was discussed today 

## 2019-03-22 NOTE — Assessment & Plan Note (Addendum)
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  Continue fentanyl transdermal and oral tramadol  for pain management . Referral  for neurosurgery pending patient preference

## 2019-03-23 ENCOUNTER — Other Ambulatory Visit: Payer: Self-pay | Admitting: Internal Medicine

## 2019-04-05 ENCOUNTER — Telehealth: Payer: Self-pay

## 2019-04-05 NOTE — Telephone Encounter (Signed)
Copied from [place] [id number]. Topic: General - Other >> Apr 04, 2019  4:39 PM Yvette Rack wrote: Reason for CRM: Heather with [facility] stated she needs to know what patient is being treated for in regards to the pain medication. Heather requests call back.

## 2019-04-05 NOTE — Telephone Encounter (Signed)
Spoke with [name], pharmacist at [facility] and gave her the dx codes for why the pt is using fentayl patches.

## 2019-04-18 ENCOUNTER — Ambulatory Visit: Payer: Self-pay | Admitting: Internal Medicine

## 2019-04-18 NOTE — Telephone Encounter (Signed)
Pt. Reports she woke up with her right arm swollen - from her hand to the elbow. States she has a hard time closing her hand. Skin "looks shiny." No redness or pain. No fever. Warm transfer to Tammy in the practice.  Answer Assessment - Initial Assessment Questions 1. ONSET: "When did the swelling start?" (e.g., minutes, hours, days)     Today 2. LOCATION: "What part of the arm is swollen?"  "Are both arms swollen or just one arm?"     Hand up to elbow 3. SEVERITY: "How bad is the swelling?" (e.g., localized; mild, moderate, severe)   - LOCALIZED: Small area of puffiness or swelling on just one arm   - JOINT SWELLING: Swelling of one joint   - MILD: Puffiness or swelling of hand   - MODERATE: Puffiness or swollen feeling of entire arm    - SEVERE: All of arm looks swollen; pitting edema     Moderate 4. REDNESS: "Does the swelling look red or infected?"     No 5. PAIN: "Is the swelling painful to touch?" If so, ask: "How painful is it?"   (Scale 1-10; mild, moderate or severe)     No 6. FEVER: "Do you have a fever?" If so, ask: "What is it, how was it measured, and when did it start?"      No 7. CAUSE: "What do you think is causing the arm swelling?"     Unsure 8. MEDICAL HISTORY: "Do you have a history of heart failure, kidney disease, liver failure, or cancer?"     No 9. RECURRENT SYMPTOM: "Have you had arm swelling before?" If so, ask: "When was the last time?" "What happened that time?"     No 10. OTHER SYMPTOMS: "Do you have any other symptoms?" (e.g., chest pain, difficulty breathing)       No 11. PREGNANCY: "Is there any chance you are pregnant?" "When was your last menstrual period?"       No  Protocols used: ARM SWELLING AND EDEMA-A-AH

## 2019-04-18 NOTE — Telephone Encounter (Signed)
Rasheedah called pt back and pt stated that her arm was hanging down off the bed. There is no pain and no redness. Pt scheduled a telephone visit with you on Friday.

## 2019-04-19 ENCOUNTER — Telehealth: Payer: Self-pay | Admitting: Internal Medicine

## 2019-04-20 ENCOUNTER — Ambulatory Visit (INDEPENDENT_AMBULATORY_CARE_PROVIDER_SITE_OTHER): Payer: Medicare Other | Admitting: Internal Medicine

## 2019-04-20 ENCOUNTER — Other Ambulatory Visit: Payer: Self-pay

## 2019-04-20 ENCOUNTER — Encounter: Payer: Self-pay | Admitting: Internal Medicine

## 2019-04-20 DIAGNOSIS — I10 Essential (primary) hypertension: Secondary | ICD-10-CM

## 2019-04-20 DIAGNOSIS — M7989 Other specified soft tissue disorders: Secondary | ICD-10-CM | POA: Diagnosis not present

## 2019-04-20 DIAGNOSIS — F1190 Opioid use, unspecified, uncomplicated: Secondary | ICD-10-CM | POA: Diagnosis not present

## 2019-04-20 DIAGNOSIS — M25473 Effusion, unspecified ankle: Secondary | ICD-10-CM | POA: Diagnosis not present

## 2019-04-20 MED ORDER — LOSARTAN POTASSIUM 100 MG PO TABS: 100.0000 mg | ORAL_TABLET | Freq: Every day | ORAL | 0 refills | Status: DC

## 2019-04-20 NOTE — Progress Notes (Signed)
Virtual Visit converted to Telephone  Note  This visit type was conducted due to national recommendations for restrictions regarding the COVID-19 pandemic (e.g. social distancing).  This format is felt to be most appropriate for this patient at this time.  All issues noted in this document were discussed and addressed.  No physical exam was performed (except for noted visual exam findings with Video Visits).   I attempted to connect with@ on 04/20/19 at 10:00 AM EDT by a video enabled telemedicine application ; however  Interactive audio and video telecommunications  Ultimately failed, due to patient having technical difficulties.   We continued and completed visit with audio only. I and verified that I am speaking with the correct person using two identifiers. Location patient: home Location provider: work or home office Persons participating in the virtual visit: patient, provider  I discussed the limitations, risks, security and privacy concerns of performing an evaluation and management service by telephone and the availability of in person appointments. I also discussed with the patient that there may be a patient responsible charge related to this service. The patient expressed understanding and agreed to proceed.  Reason for visit:  HPI:  Yesterday woke up with a swollen right hand and called office.  She reported no bruising , and did note that she had  slept on her right arm with the  handdangling off the bed because she sleeps on right side. No recent blood draws or iv on right side.  No recent trauma.  The swelling has significantly improved .   Back pain has been constant despite using fentanyl (refills on file with [facility] through august )    Getting ready to have an  ESI  Has a fluid collection  on top of foot and below the ankle bone  Lateral side . No pain or bruising, no recent twisting or trauma.  Denies fluid retention in the tib fib area or calf.   bp contolled on lisinopril  divice dose and labetalol.  Discussed change to losartan  30 day trial .  ROS: See pertinent positives and negatives per HPI.  Past Medical History:  Diagnosis Date  . Anemia of chronic kidney failure   . Anxiety   . CAD (coronary artery disease)   . Carotid artery stenosis   . Chronic kidney disease, stage III (moderate) (HCC)    Followed by Dr. Juleen China  . Hyperlipidemia   . Hypertension   . Renal artery stenosis ([place])   . Secondary hyperparathyroidism ([place])   . 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 Subclavian arterial stenosis [facility])     Past Surgical History:  Procedure Laterality Date  . ABDOMINAL HYSTERECTOMY  1976  . CAROTID ARTERY ANGIOPLASTY Left   . CAROTID ENDARTERECTOMY Left   . CHOLECYSTECTOMY    . COLONOSCOPY WITH PROPOFOL N/A 08/16/2017   Procedure: COLONOSCOPY WITH PROPOFOL;  Surgeon: [name], MD;  Location: [facility] ENDOSCOPY;  Service: Endoscopy;  Laterality: N/A;  . CORONARY ANGIOPLASTY WITH STENT PLACEMENT  2000  . CORONARY ARTERY BYPASS GRAFT  2000  . CYSTOSCOPY WITH STENT PLACEMENT Bilateral   . ESOPHAGOGASTRODUODENOSCOPY (EGD) WITH PROPOFOL N/A 08/16/2017   Procedure: ESOPHAGOGASTRODUODENOSCOPY (EGD) WITH PROPOFOL;  Surgeon: [name], MD;  Location: [facility] ENDOSCOPY;  Service: Endoscopy;  Laterality: N/A;  . ESOPHAGOGASTRODUODENOSCOPY (EGD) WITH PROPOFOL N/A 06/29/2018   Procedure: ESOPHAGOGASTRODUODENOSCOPY (EGD) WITH PROPOFOL;  Surgeon: Virgel Manifold, MD;  Location: [facility] ENDOSCOPY;  Service: Endoscopy;  Laterality: N/A;  .  JOINT REPLACEMENT Bilateral 2000  . RENAL ARTERY ANGIOPLASTY Bilateral mARCH 2015  . TOE AMPUTATION Right    small toe  . TONSILLECTOMY AND ADENOIDECTOMY    . TOTAL HIP ARTHROPLASTY Left   . TOTAL HIP ARTHROPLASTY Right 2015    Family History  Problem Relation Age of Onset  . Stroke Mother   . Hypertension Mother   . Diabetes Mother   . Hypertension Father   . Heart disease Sister        MI  .  Multiple sclerosis Daughter   . Multiple sclerosis Son   . Cerebral aneurysm Son   . Seizures Son   . Cerebral aneurysm Son   . Breast cancer Paternal Aunt 41    SOCIAL HX:  reports that she quit smoking about 20 years ago. She has never used smokeless tobacco. She reports that she does not drink alcohol or use drugs.   Current Outpatient Medications:  .  aspirin 81 MG tablet, Take 81 mg by mouth daily., Disp: , Rfl:  .  chlordiazePOXIDE (LIBRIUM) 25 MG capsule, TAKE ONE CAPSULE BY MOUTH ONCE DAILY AS NEEDED FOR ANXIETY, Disp: 30 capsule, Rfl: 0 .  fentaNYL (DURAGESIC) 50 MCG/HR, Place 1 patch onto the skin every 3 (three) days., Disp: 10 patch, Rfl: 0 .  [START ON 05/07/2019] fentaNYL (DURAGESIC) 50 MCG/HR, Place 1 patch onto the skin every 3 (three) days., Disp: 10 patch, Rfl: 0 .  [START ON 06/06/2019] fentaNYL (DURAGESIC) 50 MCG/HR, Place 1 patch onto the skin every 3 (three) days., Disp: 10 patch, Rfl: 0 .  hydrALAZINE (APRESOLINE) 50 MG tablet, TAKE 1 TABLET BY MOUTH THREE TIMES DAILY AS NEEDED FOR BLOOD PRESSURE GREATER THAN 150, Disp: 270 tablet, Rfl: 1 .  labetalol (NORMODYNE) 200 MG tablet, Take 1 tablet (200 mg total) by mouth 2 (two) times daily., Disp: 180 tablet, Rfl: 1 .  Multiple Vitamin (MULTIVITAMIN) tablet, Take 1 tablet by mouth daily., Disp: , Rfl:  .  Omega 3 1000 MG CAPS, Take 1 capsule by mouth 2 (two) times daily. , Disp: , Rfl:  .  pantoprazole (PROTONIX) 40 MG tablet, Take 1 tablet (40 mg total) by mouth daily., Disp: 30 tablet, Rfl: 3 .  traMADol (ULTRAM) 50 MG tablet, TAKE TWO TABLETS BY MOUTH EVERY 12 HOURS AS NEEDED max 4 daily, Disp: 120 tablet, Rfl: 3 .  ZETIA 10 MG tablet, Take 1 tablet by mouth once daily, Disp: 30 tablet, Rfl: 5 .  clopidogrel (PLAVIX) 75 MG tablet, Take 1 tablet by mouth once daily (Patient not taking: Reported on 04/20/2019), Disp: 90 tablet, Rfl: 1 .  losartan (COZAAR) 100 MG tablet, Take 1 tablet (100 mg total) by mouth at bedtime., Disp:  30 tablet, Rfl: 0  EXAM:   General impression: alert, cooperative and articulate.  No signs of being in distress  Lungs: speech is fluent sentence length suggests that patient is not short of breath and not punctuated by cough, sneezing or sniffing. Marland Kitchen   Psych: affect normal.  speech is articulate and non pressured .  Denies suicidal thoughts   ASSESSMENT AND PLAN:    Swelling of right hand history suggestive of compression vs positional edema . 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  Continue fentanyl transdermal and oral tramadol  for pain management .  Referred to  neurosurgery ,  ESI planned   HTN (hypertension) Well controlled on current regimen of hydralazine, . Renal function stable, no changes today.  of l Lab Results  Component Value Date   CREATININE 1.22 (H) 12/21/2018   Lab Results  Component Value Date   NA 135 12/21/2018   K 5.0 12/21/2018   CL 102 12/21/2018   CO2 25 12/21/2018     Ankle swelling Advised to wear compression stockings and to elevate feet     I discussed the assessment and treatment plan with the patient. The patient was provided an opportunity to ask questions and all were answered. The patient agreed with the plan and demonstrated an understanding of the instructions.   The patient was advised to call back or seek an in-person evaluation if the symptoms worsen or if the condition fails to improve as anticipated.  I provided 25 minutes of non-face-to-face time during this encounter.   Crecencio Mc, MD

## 2019-04-20 NOTE — Progress Notes (Signed)
Patient needs a refill fentanyl patch.

## 2019-04-22 DIAGNOSIS — M7989 Other specified soft tissue disorders: Secondary | ICD-10-CM | POA: Insufficient documentation

## 2019-04-22 DIAGNOSIS — M25473 Effusion, unspecified ankle: Secondary | ICD-10-CM | POA: Insufficient documentation

## 2019-04-22 MED ORDER — PANTOPRAZOLE SODIUM 40 MG PO TBEC: 40.0000 mg | DELAYED_RELEASE_TABLET | Freq: Every day | ORAL | 3 refills | Status: DC

## 2019-04-22 NOTE — Assessment & Plan Note (Addendum)
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  Continue fentanyl transdermal and oral tramadol  for pain management . Referred to  neurosurgery ,  ESI planned

## 2019-04-22 NOTE — Assessment & Plan Note (Signed)
history suggestive of compression vs positional edema . Which is resolving,  And does not meet criteria for investigation for DVT

## 2019-04-22 NOTE — Assessment & Plan Note (Addendum)
Well controlled on current regimen of hydralazine, . Renal function stable, no changes today.  of l Lab Results  Component Value Date   CREATININE 1.22 (H) 12/21/2018   Lab Results  Component Value Date   NA 135 12/21/2018   K 5.0 12/21/2018   CL 102 12/21/2018   CO2 25 12/21/2018

## 2019-04-22 NOTE — Assessment & Plan Note (Signed)
Advised to wear compression stockings and to elevate feet

## 2019-05-03 ENCOUNTER — Other Ambulatory Visit: Payer: Self-pay

## 2019-05-03 NOTE — Addendum Note (Signed)
Addended by: Adair Laundry on: 05/03/2019 11:58 AM   Modules accepted: Orders

## 2019-05-03 NOTE — Telephone Encounter (Signed)
Refilled: 07/21/2018 Last OV: 04/20/2019 Next OV: not scheduled

## 2019-05-03 NOTE — Telephone Encounter (Signed)
Sorry its the librium.

## 2019-05-03 NOTE — Telephone Encounter (Signed)
No medication attached to refill request

## 2019-05-04 MED ORDER — CHLORDIAZEPOXIDE HCL 25 MG PO CAPS: ORAL_CAPSULE | ORAL | 0 refills | Status: DC

## 2019-05-07 ENCOUNTER — Other Ambulatory Visit: Payer: Self-pay | Admitting: Internal Medicine

## 2019-05-07 DIAGNOSIS — Z1231 Encounter for screening mammogram for malignant neoplasm of breast: Secondary | ICD-10-CM

## 2019-05-15 ENCOUNTER — Telehealth: Payer: Self-pay | Admitting: Internal Medicine

## 2019-05-15 MED ORDER — LISINOPRIL 20 MG PO TABS: 40.0000 mg | ORAL_TABLET | Freq: Two times a day (BID) | ORAL | 1 refills | Status: DC

## 2019-05-15 NOTE — Telephone Encounter (Signed)
Called and spoke to pt.  Pt c/o sweating and feeling sluggish.  Pt said that she stopped the lisinopril and started losartan on June 25.  Pt said that she just noticed these symptoms 2 days ago.  Pt said that she wakes up in the morning and at night sweating.  Pt says that she is sweating so bad that she has to change clothes throughout the day.  Pt said that she feels tired and only wants to stay in the bed.  Pt said that she has felt nauseous.  Pt denied having any other symptoms.  No chest pain, no chest pressure, no shortness of pain, no arm, no jaw pain.  Pt said that she had not checked her blood pressure today.  I had pt check bp while on phone.  Pt said bp machine reading was 148/80.  HR was 78.  Pt said that she had these same symptoms before when she was on losartan in the past.  Pt is requesting that Dr. Derrel Nip switch her back to lisinopril.  Advised pt to go to ED/UC if symptoms worsen.

## 2019-05-15 NOTE — Telephone Encounter (Signed)
Patient inquired if Dr. Derrel Nip would approve of switching from losartan (COZAAR) 100 MG tablet  back to Lisinopril as she feels the lostartan is making her feel sluggish and causing excessive sweating. Please advise.

## 2019-05-15 NOTE — Telephone Encounter (Signed)
AGREE  STOP LOSARTAN  LISINOPRIL 20 MG BID,  SENT TO [facility]

## 2019-05-16 NOTE — Telephone Encounter (Signed)
Spoke with Sabrina [name] to let her know that Dr. Derrel Nip does want her to stop the losartan and go back to taking the lisinopril 20mg  BID. Sabrina [name] gave a verbal understanding.

## 2019-05-23 ENCOUNTER — Encounter: Payer: Self-pay | Admitting: Oncology

## 2019-05-23 ENCOUNTER — Inpatient Hospital Stay (HOSPITAL_BASED_OUTPATIENT_CLINIC_OR_DEPARTMENT_OTHER): Payer: Medicare Other | Admitting: Oncology

## 2019-05-23 ENCOUNTER — Inpatient Hospital Stay: Payer: Medicare Other | Attending: Oncology

## 2019-05-23 ENCOUNTER — Other Ambulatory Visit: Payer: Self-pay

## 2019-05-23 VITALS — BP 157/80 | HR 70 | Temp 97.3°F | Wt 160.0 lb

## 2019-05-23 DIAGNOSIS — D631 Anemia in chronic kidney disease: Secondary | ICD-10-CM | POA: Insufficient documentation

## 2019-05-23 DIAGNOSIS — N183 Chronic kidney disease, stage 3 unspecified: Secondary | ICD-10-CM

## 2019-05-23 DIAGNOSIS — E785 Hyperlipidemia, unspecified: Secondary | ICD-10-CM | POA: Insufficient documentation

## 2019-05-23 DIAGNOSIS — I129 Hypertensive chronic kidney disease with stage 1 through stage 4 chronic kidney disease, or unspecified chronic kidney disease: Secondary | ICD-10-CM

## 2019-05-23 DIAGNOSIS — Z79899 Other long term (current) drug therapy: Secondary | ICD-10-CM

## 2019-05-23 DIAGNOSIS — D509 Iron deficiency anemia, unspecified: Secondary | ICD-10-CM | POA: Diagnosis not present

## 2019-05-23 DIAGNOSIS — Z7982 Long term (current) use of aspirin: Secondary | ICD-10-CM | POA: Diagnosis not present

## 2019-05-23 DIAGNOSIS — Z8249 Family history of ischemic heart disease and other diseases of the circulatory system: Secondary | ICD-10-CM | POA: Insufficient documentation

## 2019-05-23 DIAGNOSIS — Z87891 Personal history of nicotine dependence: Secondary | ICD-10-CM

## 2019-05-23 DIAGNOSIS — M549 Dorsalgia, unspecified: Secondary | ICD-10-CM | POA: Diagnosis not present

## 2019-05-23 DIAGNOSIS — R5383 Other fatigue: Secondary | ICD-10-CM

## 2019-05-23 DIAGNOSIS — G894 Chronic pain syndrome: Secondary | ICD-10-CM | POA: Diagnosis not present

## 2019-05-23 DIAGNOSIS — Z803 Family history of malignant neoplasm of breast: Secondary | ICD-10-CM

## 2019-05-23 LAB — CBC WITH DIFFERENTIAL/PLATELET
Abs Immature Granulocytes: 0.01 10*3/uL (ref 0.00–0.07)
Basophils Absolute: 0.1 10*3/uL (ref 0.0–0.1)
Basophils Relative: 1 %
Eosinophils Absolute: 1 10*3/uL — ABNORMAL HIGH (ref 0.0–0.5)
Eosinophils Relative: 16 %
HCT: 33.7 % — ABNORMAL LOW (ref 36.0–46.0)
Hemoglobin: 10.9 g/dL — ABNORMAL LOW (ref 12.0–15.0)
Immature Granulocytes: 0 %
Lymphocytes Relative: 37 %
Lymphs Abs: 2.2 10*3/uL (ref 0.7–4.0)
MCH: 28.9 pg (ref 26.0–34.0)
MCHC: 32.3 g/dL (ref 30.0–36.0)
MCV: 89.4 fL (ref 80.0–100.0)
Monocytes Absolute: 0.6 10*3/uL (ref 0.1–1.0)
Monocytes Relative: 9 %
Neutro Abs: 2.2 10*3/uL (ref 1.7–7.7)
Neutrophils Relative %: 37 %
Platelets: 205 10*3/uL (ref 150–400)
RBC: 3.77 MIL/uL — ABNORMAL LOW (ref 3.87–5.11)
RDW: 13.1 % (ref 11.5–15.5)
WBC: 5.9 10*3/uL (ref 4.0–10.5)
nRBC: 0 % (ref 0.0–0.2)

## 2019-05-23 LAB — IRON AND TIBC
Iron: 62 ug/dL (ref 28–170)
Saturation Ratios: 20 % (ref 10.4–31.8)
TIBC: 307 ug/dL (ref 250–450)
UIBC: 245 ug/dL

## 2019-05-23 LAB — COMPREHENSIVE METABOLIC PANEL
ALT: 16 U/L (ref 0–44)
AST: 18 U/L (ref 15–41)
Albumin: 3.7 g/dL (ref 3.5–5.0)
Alkaline Phosphatase: 72 U/L (ref 38–126)
Anion gap: 7 (ref 5–15)
BUN: 25 mg/dL — ABNORMAL HIGH (ref 8–23)
CO2: 28 mmol/L (ref 22–32)
Calcium: 9.4 mg/dL (ref 8.9–10.3)
Chloride: 104 mmol/L (ref 98–111)
Creatinine, Ser: 1.32 mg/dL — ABNORMAL HIGH (ref 0.44–1.00)
GFR calc Af Amer: 45 mL/min — ABNORMAL LOW (ref >60)
GFR calc non Af Amer: 39 mL/min — ABNORMAL LOW (ref >60)
Glucose, Bld: 112 mg/dL — ABNORMAL HIGH (ref 70–99)
Potassium: 4.9 mmol/L (ref 3.5–5.1)
Sodium: 139 mmol/L (ref 135–145)
Total Bilirubin: 0.5 mg/dL (ref 0.3–1.2)
Total Protein: 7.2 g/dL (ref 6.5–8.1)

## 2019-05-23 LAB — FERRITIN: Ferritin: 113 ng/mL (ref 11–307)

## 2019-05-24 NOTE — Progress Notes (Signed)
Hematology/Oncology follow up  note [facility] [phone]) [phone] [phone]   Patient Care Team: Sabrina Mc, MD as PCP - General (Internal Medicine)  REFERRING PROVIDER: Crecencio Mc, MD REASON FOR VISIT Follow up for management of anemia   HISTORY OF PRESENTING ILLNESS:  Sabrina [name] is a  78 y.o.  female with PMH listed below who was referred to me for evaluation of anemia.  Patient recently had lab work done which revealed anemia with hemoglobin 11.5.   Reviewed patient's previous labs, her hemoglobin ranges from 10.8 to 11.8 in the past.  Reports feeling fatigue.  Patient denies weight loss, easy bruising, hematochezia, hemoptysis.  Has history of CKD and follows up with nephrologist.  Chronic pain syndrome. On Tramadol and fentanyl patch.  INTERVAL HISTORY Sabrina [name] is a 78 y.o. female who has above history reviewed by me today presents for follow up visit for management of anemia.  Patient reports feeling slightly more fatigue. Denies any shortness of breath. Chronic back pain is worse.  She follows up with Dr. Derrel Nip.  Her pain medication includes fentanyl patch and tramadol.  She was seen by Dr. Derrel Nip on 04/20/2019 she was referred to neurosurgery.  Review of Systems  Constitutional: Positive for malaise/fatigue. Negative for chills, fever and weight loss.  HENT: Negative for sore throat.   Eyes: Negative for redness.  Respiratory: Negative for cough, shortness of breath and wheezing.   Cardiovascular: Negative for chest pain, palpitations and leg swelling.  Gastrointestinal: Negative for abdominal pain, blood in stool, nausea and vomiting.  Genitourinary: Negative for dysuria.  Musculoskeletal: Positive for back pain. Negative for myalgias.  Skin: Negative for rash.  Neurological: Negative for dizziness, tingling and tremors.  Endo/Heme/Allergies: Does not bruise/bleed easily.  Psychiatric/Behavioral: Negative  for hallucinations.    MEDICAL HISTORY:  Past Medical History:  Diagnosis Date  . Anemia of chronic kidney failure   . Anxiety   . CAD (coronary artery disease)   . Carotid artery stenosis   . Chronic kidney disease, stage III (moderate) (HCC)    Followed by Dr. Juleen China  . Hyperlipidemia   . Hypertension   . Renal artery stenosis ([place])   . Secondary hyperparathyroidism ([place])   . 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 ABDOMINAL HYSTERECTOMY  1976  . CAROTID ARTERY ANGIOPLASTY Left   . CAROTID ENDARTERECTOMY Left   . CHOLECYSTECTOMY    . COLONOSCOPY WITH PROPOFOL N/A 08/16/2017   Procedure: COLONOSCOPY WITH PROPOFOL;  Surgeon: [name], MD;  Location[facility] ENDOSCOPY;  Service: Endoscopy;  Laterality: N/A;  . CORONARY ANGIOPLASTY WITH STENT PLACEMENT  2000  . CORONARY ARTERY BYPASS GRAFT  2000  . CYSTOSCOPY WITH STENT PLACEMENT Bilateral   . ESOPHAGOGASTRODUODENOSCOPY (EGD) WITH PROPOFOL N/A 08/16/2017   Procedure: ESOPHAGOGASTRODUODENOSCOPY (EGD) WITH PROPOFOL;  Surgeon: [name], MD;  Location: [facility] ENDOSCOPY;  Service: Endoscopy;  Laterality: N/A;  . ESOPHAGOGASTRODUODENOSCOPY (EGD) WITH PROPOFOL N/A 06/29/2018   Procedure: ESOPHAGOGASTRODUODENOSCOPY (EGD) WITH PROPOFOL;  Surgeon: Virgel Manifold, MD;  Location: [facility] ENDOSCOPY;  Service: Endoscopy;  Laterality: N/A;  . JOINT REPLACEMENT Bilateral 2000  . RENAL ARTERY ANGIOPLASTY Bilateral mARCH 2015  . TOE AMPUTATION Right    small toe  . TONSILLECTOMY AND ADENOIDECTOMY    . TOTAL HIP ARTHROPLASTY Left   . TOTAL HIP ARTHROPLASTY Right 2015  SOCIAL HISTORY: Social History   Socioeconomic History  . Marital status: Divorced    Spouse name: Not on file  . Number of children: Not on file  . Years of education: Not on file  . Highest education level: Not on file  Occupational  History  . Occupation: retired  Scientific laboratory technician  . Financial resource strain: Not hard at all  . Food insecurity    Worry: Never true    Inability: Never true  . Transportation needs    Medical: No    Non-medical: No  Tobacco Use  . Smoking status: Former Smoker    Quit date: 10/25/1998    Years since quitting: 20.5  . Smokeless tobacco: Never Used  Substance and Sexual Activity  . Alcohol use: No    Alcohol/week: 0.0 standard drinks  . Drug use: Never  . Sexual activity: Not Currently  Lifestyle  . Physical activity    Days per week: 0 days    Minutes per session: 0 min  . Stress: Patient refused  Relationships  . Social Herbalist on phone: Patient refused    Gets together: Patient refused    Attends religious service: Patient refused    Active member of club or organization: Patient refused    Attends meetings of clubs or organizations: Patient refused    Relationship status: Patient refused  . Intimate partner violence    Fear of current or ex partner: No    Emotionally abused: No    Physically abused: No    Forced sexual activity: No  Other Topics Concern  . Not on file  Social History Narrative  . Not on file    FAMILY HISTORY: Family History  Problem Relation Age of Onset  . Stroke Mother   . Hypertension Mother   . Diabetes Mother   . Hypertension Father   . Heart disease Sister        MI  . Multiple sclerosis Daughter   . Multiple sclerosis Son   . Cerebral aneurysm Son   . Seizures Son   . Cerebral aneurysm Son   . Breast cancer Paternal Aunt 27    ALLERGIES:  is allergic to citalopram; dilaudid [hydromorphone hcl]; hydrochlorothiazide; nsaids; nubain [nalbuphine hcl]; penicillins; prasugrel; and statins.  MEDICATIONS:  Current Outpatient Medications  Medication Sig Dispense Refill  . aspirin 81 MG tablet Take 81 mg by mouth daily.    . chlordiazePOXIDE (LIBRIUM) 25 MG capsule TAKE ONE CAPSULE BY MOUTH ONCE DAILY AS NEEDED FOR ANXIETY  30 capsule 0  . clopidogrel (PLAVIX) 75 MG tablet Take 1 tablet by mouth once daily 90 tablet 1  . fentaNYL (DURAGESIC) 50 MCG/HR Place 1 patch onto the skin every 3 (three) days. 10 patch 0  . fentaNYL (DURAGESIC) 50 MCG/HR Place 1 patch onto the skin every 3 (three) days. 10 patch 0  . [START ON 06/06/2019] fentaNYL (DURAGESIC) 50 MCG/HR Place 1 patch onto the skin every 3 (three) days. 10 patch 0  . hydrALAZINE (APRESOLINE) 50 MG tablet TAKE 1 TABLET BY MOUTH THREE TIMES DAILY AS NEEDED FOR BLOOD PRESSURE GREATER THAN 150 270 tablet 1  . labetalol (NORMODYNE) 200 MG tablet Take 1 tablet (200 mg total) by mouth 2 (two) times daily. 180 tablet 1  . lisinopril (ZESTRIL) 20 MG tablet Take 2 tablets (40 mg total) by mouth 2 (two) times daily. 360 tablet 1  . Multiple Vitamin (MULTIVITAMIN) tablet Take 1 tablet by mouth daily.    Marland Kitchen  Omega 3 1000 MG CAPS Take 1 capsule by mouth 2 (two) times daily.     . pantoprazole (PROTONIX) 40 MG tablet Take 1 tablet (40 mg total) by mouth daily. 30 tablet 3  . traMADol (ULTRAM) 50 MG tablet TAKE TWO TABLETS BY MOUTH EVERY 12 HOURS AS NEEDED max 4 daily 120 tablet 3  . ZETIA 10 MG tablet Take 1 tablet by mouth once daily 30 tablet 5   No current facility-administered medications for this visit.      PHYSICAL EXAMINATION: ECOG PERFORMANCE STATUS: 1 - Symptomatic but completely ambulatory Vitals:   05/23/19 1337  BP: (!) 157/80  Pulse: 70  Temp: (!) 97.3 F (36.3 C)   Filed Weights   05/23/19 1337  Weight: 160 lb (72.6 kg)    Physical Exam Constitutional:      General: She is not in acute distress.    Appearance: She is well-developed.  HENT:     Head: Normocephalic and atraumatic.     Right Ear: External ear normal.     Left Ear: External ear normal.  Eyes:     General: No scleral icterus.    Conjunctiva/sclera: Conjunctivae normal.     Pupils: Pupils are equal, round, and reactive to light.  Neck:     Musculoskeletal: Normal range of motion  and neck supple.  Cardiovascular:     Rate and Rhythm: Normal rate and regular rhythm.     Heart sounds: Normal heart sounds.  Pulmonary:     Effort: Pulmonary effort is normal. No respiratory distress.     Breath sounds: Normal breath sounds. No wheezing or rales.  Chest:     Chest wall: No tenderness.  Abdominal:     General: Bowel sounds are normal. There is no distension.     Palpations: Abdomen is soft. There is no mass.     Tenderness: There is no abdominal tenderness.  Musculoskeletal: Normal range of motion.        General: No deformity.  Lymphadenopathy:     Cervical: No cervical adenopathy.  Skin:    General: Skin is warm and dry.     Findings: No erythema or rash.  Neurological:     Mental Status: She is alert and oriented to person, place, and time.     Cranial Nerves: No cranial nerve deficit.     Coordination: Coordination normal.  Psychiatric:        Behavior: Behavior normal.        Thought Content: Thought content normal.      LABORATORY DATA:  I have reviewed the data as listed Lab Results  Component Value Date   WBC 5.9 05/23/2019   HGB 10.9 (L) 05/23/2019   HCT 33.7 (L) 05/23/2019   MCV 89.4 05/23/2019   PLT 205 05/23/2019   Recent Labs    12/21/18 1434 05/23/19 1256  NA 135 139  K 5.0 4.9  CL 102 104  CO2 25 28  GLUCOSE 105* 112*  BUN 24* 25*  CREATININE 1.22* 1.32*  CALCIUM 9.5 9.4  GFRNONAA  --  39*  GFRAA  --  45*  PROT 7.5 7.2  ALBUMIN 4.0 3.7  AST 26 18  ALT 16 16  ALKPHOS 70 72  BILITOT 0.4 0.5       ASSESSMENT & PLAN:  1. Iron deficiency anemia, unspecified iron deficiency anemia type   2. Stage 3 chronic kidney disease ([place])   3. Anemia due to stage 3 chronic kidney disease ([place])  Labs reviewed and discussed with patient. Iron panel shows ferritin 113, iron saturation 20.  No iron deficiency. Hemoglobin is slightly worse, 10.9.. Hold erythropoietin replacement therapy. Also discussed with patient that if  hemoglobin drop below 10, erythropoietin replacement therapy is indicated. Prior to that, we would recommend iron store to be further increased, which can be achieved by IV iron. Patient is not interested in any IV infusion.  She prefers to take oral iron tablets She could not tolerate ferrous sulfate due to GI side effects.  She prefers to take over-the-counter iron supplementation Slow Fe.  Return of visit: 4 months.  Earlie Server, MD, PhD Hematology Oncology [place] [facility] 05/24/2019

## 2019-05-28 ENCOUNTER — Telehealth: Payer: Self-pay | Admitting: Internal Medicine

## 2019-05-28 NOTE — Telephone Encounter (Signed)
Spoke with pt to let her know that Dr. Derrel Nip said she can use the TENS unit. Pt gave a verbal understanding.

## 2019-05-28 NOTE — Telephone Encounter (Signed)
Patient would like to know if she can use a tens unit for the pain in her legs.  Please advise.

## 2019-05-28 NOTE — Telephone Encounter (Signed)
Yes she can use the TENS unit.

## 2019-05-28 NOTE — Telephone Encounter (Signed)
Called and spoke to pt.  Pt wants to know if she can use a tens unit for back and hip pain.  Pt c/o lower back pain and pain in both hip joints.  Pt rated pain today a 10 out of 10 on the pain scale.  Pt said that extreme pain has been ongoing for last 2 weeks.  Pt said that she has had 2 hip replacements.  Pt says that she is not taking any kind of pain medication. Pt says she is using some type of ointment but it doesn't relieve the pain.    Pt said that the pain goes to a zero when she is sitting but returns when she walks and the more she walks the worse it hurts.  Recommended that pt go to [facility] to be seen today due to intensity of pain.  Pt said that she didn't want to go since they would only tell her to follow up with her primary care doctor.   Offered pt an appt w/ FNP tomorrow but declined and prefers to see her PCP.  Informed pt that Dr. Lupita Dawn next available appt is not until next Monday.  Pt said she would prefer to wait and should have the tens unit in by then.   Pt scheduled a phone appt w/ Dr. Derrel Nip on 06/04/19 @ 10:00 am.

## 2019-06-04 ENCOUNTER — Ambulatory Visit (INDEPENDENT_AMBULATORY_CARE_PROVIDER_SITE_OTHER): Payer: Medicare Other | Admitting: Internal Medicine

## 2019-06-04 ENCOUNTER — Other Ambulatory Visit: Payer: Self-pay | Admitting: Internal Medicine

## 2019-06-04 ENCOUNTER — Other Ambulatory Visit: Payer: Self-pay

## 2019-06-04 DIAGNOSIS — I10 Essential (primary) hypertension: Secondary | ICD-10-CM | POA: Diagnosis not present

## 2019-06-04 DIAGNOSIS — M48062 Spinal stenosis, lumbar region with neurogenic claudication: Secondary | ICD-10-CM

## 2019-06-04 DIAGNOSIS — L603 Nail dystrophy: Secondary | ICD-10-CM | POA: Diagnosis not present

## 2019-06-04 DIAGNOSIS — F1190 Opioid use, unspecified, uncomplicated: Secondary | ICD-10-CM | POA: Diagnosis not present

## 2019-06-04 DIAGNOSIS — F411 Generalized anxiety disorder: Secondary | ICD-10-CM

## 2019-06-04 DIAGNOSIS — E782 Mixed hyperlipidemia: Secondary | ICD-10-CM | POA: Diagnosis not present

## 2019-06-04 MED ORDER — FENTANYL 50 MCG/HR TD PT72: 1.0000 | MEDICATED_PATCH | TRANSDERMAL | 0 refills | Status: DC

## 2019-06-04 MED ORDER — FERROUS SULFATE 325 (65 FE) MG PO TABS: 325.0000 mg | ORAL_TABLET | Freq: Every day | ORAL | 0 refills | Status: DC

## 2019-06-04 MED ORDER — LISINOPRIL 40 MG PO TABS: 40.0000 mg | ORAL_TABLET | Freq: Two times a day (BID) | ORAL | 1 refills | Status: DC

## 2019-06-04 MED ORDER — CLOPIDOGREL BISULFATE 75 MG PO TABS: 75.0000 mg | ORAL_TABLET | Freq: Every day | ORAL | 3 refills | Status: DC

## 2019-06-04 NOTE — Assessment & Plan Note (Signed)
Managed with zetia due to statin intolerance. Repeat labs are due   Lab Results  Component Value Date   CHOL 167 02/22/2018   HDL 54.80 02/22/2018   LDLCALC 102 (H) 02/22/2018   LDLDIRECT 109.0 03/05/2016   TRIG 53.0 02/22/2018   CHOLHDL 3 02/22/2018

## 2019-06-04 NOTE — Assessment & Plan Note (Signed)
Did not tolerate losartan trial  Resuming lisinopril 40 mg bid,  Continue labetalol 200 mg bid,  And hydralazine prn sbp > 150  Lab Results  Component Value Date   CREATININE 1.32 (H) 05/23/2019    Lab Results  Component Value Date   NA 139 05/23/2019   K 4.9 05/23/2019   CL 104 05/23/2019   CO2 28 05/23/2019

## 2019-06-04 NOTE — Progress Notes (Signed)
Ab re

## 2019-06-04 NOTE — Progress Notes (Signed)
Telephone Note  This visit type was conducted due to national recommendations for restrictions regarding the COVID-19 pandemic (e.g. social distancing).  This format is felt to be most appropriate for this patient at this time.  All issues noted in this document were discussed and addressed.  No physical exam was performed (except for noted visual exam findings with Video Visits).   I connected with@ on 06/04/19 at 10:00 AM EDT by a video enabled telemedicine application or telephone and verified that I am speaking with the correct person using two identifiers. Location patient: home Location provider: work or home office Persons participating in the virtual visit: patient, provider  I discussed the limitations, risks, security and privacy concerns of performing an evaluation and management service by telephone and the availability of in person appointments. I also discussed with the patient that there may be a patient responsible charge related to this service. The patient expressed understanding and agreed to proceed.  Reason for visit: follow up   HPI:  HTN: back on lisinopril as of July 21 . BP at home well controlled on 40 mg bid , labetalol 200 mg bid and hydralazine prn   IDA:  Seeing heme onc   hgb 10.9, iron studies normal.  Told to resume ferrous sulfate once daily.   Headache: seeing [facility] ,  Infrequent headaches,  Recommend usf og Tramadol prn   Spinal stenosis has been referred in the past to [place] .  Seen Oct -29 no surgical intervention recommended for cervical spine due to lack of radiculopathy symptoms. .   Back and leg pain : wants to see Dr. Ahmed Prima at [facility].  She has tried adding the TENS Unit,  No relief,  Using 50 mcg fentanyl transdermal; her Last refill Jun 29,  She is On last patch.  he has not had any ER visits  And has not requested any early refills.  Her Refill history was confirmed via [place] Controlled Substance database by me today during her visit  and there have been no prescriptions of controlled substances filled from any providers other than me. .   Anxiety:  using librium prn ,    dystrophic nails:  Cannot trim her own  toenails,  Toes hurting bc of length,  requesting podiatry referral    ROS: See pertinent positives and negatives per HPI.  Past Medical History:  Diagnosis Date  . Anemia of chronic kidney failure   . Anxiety   . CAD (coronary artery disease)   . Carotid artery stenosis   . Chronic kidney disease, stage III (moderate) (HCC)    Followed by Dr. Juleen China  . Hyperlipidemia   . Hypertension   . Renal artery stenosis ([place])   . Secondary hyperparathyroidism ([place])   . 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 Subclavian arterial stenosis [facility])     Past Surgical History:  Procedure Laterality Date  . ABDOMINAL HYSTERECTOMY  1976  . CAROTID ARTERY ANGIOPLASTY Left   . CAROTID ENDARTERECTOMY Left   . CHOLECYSTECTOMY    . COLONOSCOPY WITH PROPOFOL N/A 08/16/2017   Procedure: COLONOSCOPY WITH PROPOFOL;  Surgeon: [name], MD;  Location: [facility] ENDOSCOPY;  Service: Endoscopy;  Laterality: N/A;  . CORONARY ANGIOPLASTY WITH STENT PLACEMENT  2000  . CORONARY ARTERY BYPASS GRAFT  2000  . CYSTOSCOPY WITH STENT PLACEMENT Bilateral   . ESOPHAGOGASTRODUODENOSCOPY (EGD) WITH PROPOFOL N/A 08/16/2017   Procedure: ESOPHAGOGASTRODUODENOSCOPY (EGD) WITH PROPOFOL;  Surgeon: [name], MD;  Location:  [facility] ENDOSCOPY;  Service: Endoscopy;  Laterality: N/A;  . ESOPHAGOGASTRODUODENOSCOPY (EGD) WITH PROPOFOL N/A 06/29/2018   Procedure: ESOPHAGOGASTRODUODENOSCOPY (EGD) WITH PROPOFOL;  Surgeon: Virgel Manifold, MD;  Location: [facility] ENDOSCOPY;  Service: Endoscopy;  Laterality: N/A;  . JOINT REPLACEMENT Bilateral 2000  . RENAL ARTERY ANGIOPLASTY Bilateral mARCH 2015  . TOE AMPUTATION Right    small toe  . TONSILLECTOMY AND ADENOIDECTOMY    . TOTAL HIP ARTHROPLASTY Left   . TOTAL HIP ARTHROPLASTY Right 2015     Family History  Problem Relation Age of Onset  . Stroke Mother   . Hypertension Mother   . Diabetes Mother   . Hypertension Father   . Heart disease Sister        MI  . Multiple sclerosis Daughter   . Multiple sclerosis Son   . Cerebral aneurysm Son   . Seizures Son   . Cerebral aneurysm Son   . Breast cancer Paternal Aunt 13    SOCIAL HX:  reports that she quit smoking about 20 years ago. She has never used smokeless tobacco. She reports that she does not drink alcohol or use drugs.   Current Outpatient Medications:  .  aspirin 81 MG tablet, Take 81 mg by mouth daily., Disp: , Rfl:  .  chlordiazePOXIDE (LIBRIUM) 25 MG capsule, TAKE ONE CAPSULE BY MOUTH ONCE DAILY AS NEEDED FOR ANXIETY, Disp: 30 capsule, Rfl: 0 .  clopidogrel (PLAVIX) 75 MG tablet, Take 1 tablet (75 mg total) by mouth daily., Disp: 90 tablet, Rfl: 3 .  fentaNYL (DURAGESIC) 50 MCG/HR, Place 1 patch onto the skin every 3 (three) days., Disp: 30 patch, Rfl: 0 .  [START ON 07/04/2019] fentaNYL (DURAGESIC) 50 MCG/HR, Place 1 patch onto the skin every 3 (three) days., Disp: 10 patch, Rfl: 0 .  [START ON 08/03/2019] fentaNYL (DURAGESIC) 50 MCG/HR, Place 1 patch onto the skin every 3 (three) days., Disp: 10 patch, Rfl: 0 .  ferrous sulfate (FEROSUL) 325 (65 FE) MG tablet, Take 1 tablet (325 mg total) by mouth daily with breakfast., Disp: 90 tablet, Rfl: 0 .  hydrALAZINE (APRESOLINE) 50 MG tablet, TAKE 1 TABLET BY MOUTH THREE TIMES DAILY AS NEEDED FOR BLOOD PRESSURE GREATER THAN 150, Disp: 270 tablet, Rfl: 1 .  labetalol (NORMODYNE) 200 MG tablet, Take 1 tablet (200 mg total) by mouth 2 (two) times daily., Disp: 180 tablet, Rfl: 1 .  lisinopril (ZESTRIL) 40 MG tablet, Take 1 tablet (40 mg total) by mouth 2 (two) times daily., Disp: 180 tablet, Rfl: 1 .  Multiple Vitamin (MULTIVITAMIN) tablet, Take 1 tablet by mouth daily., Disp: , Rfl:  .  Omega 3 1000 MG CAPS, Take 1 capsule by mouth 2 (two) times daily. , Disp: , Rfl:  .   pantoprazole (PROTONIX) 40 MG tablet, Take 1 tablet (40 mg total) by mouth daily., Disp: 30 tablet, Rfl: 3 .  traMADol (ULTRAM) 50 MG tablet, TAKE TWO TABLETS BY MOUTH EVERY 12 HOURS AS NEEDED max 4 daily, Disp: 120 tablet, Rfl: 3 .  ZETIA 10 MG tablet, Take 1 tablet by mouth once daily, Disp: 30 tablet, Rfl: 5  EXAM:   General impression: alert, cooperative and articulate.  No signs of being in distress  Lungs: speech is fluent sentence length suggests that patient is not short of breath and not punctuated by cough, sneezing or sniffing. Marland Kitchen   Psych: affect normal.  speech is articulate and non pressured .  Denies suicidal thoughts   ASSESSMENT AND PLAN:  HLD (hyperlipidemia) Managed with zetia due to statin intolerance. Repeat labs are due   Lab Results  Component Value Date   CHOL 167 02/22/2018   HDL 54.80 02/22/2018   LDLCALC 102 (H) 02/22/2018   LDLDIRECT 109.0 03/05/2016   TRIG 53.0 02/22/2018   CHOLHDL 3 02/22/2018     HTN (hypertension) Did not tolerate losartan trial  Resuming lisinopril 40 mg bid,  Continue labetalol 200 mg bid,  And hydralazine prn sbp > 150  Lab Results  Component Value Date   CREATININE 1.32 (H) 05/23/2019    Lab Results  Component Value Date   NA 139 05/23/2019   K 4.9 05/23/2019   CL 104 05/23/2019   CO2 28 05/23/2019     Chronic narcotic use Chronic,  Secondary to pain caused by 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  Continue fentanyl transdermal and oral tramadol  for pain management .she is requesting referral for [facility]  Generalized anxiety disorder She has infrequent episodes of extremeanxiety managed with prn librium     I discussed the assessment and treatment plan with the patient. The patient was provided an opportunity to ask questions and all were answered. The patient agreed with the plan and demonstrated an understanding of the instructions.   The patient was advised to call back or  seek an in-person evaluation if the symptoms worsen or if the condition fails to improve as anticipated.  I provided 25 minutes of non-face-to-face time during this encounter.   Crecencio Mc, MD

## 2019-06-04 NOTE — Assessment & Plan Note (Signed)
She has infrequent episodes of extremeanxiety managed with prn librium

## 2019-06-04 NOTE — Assessment & Plan Note (Signed)
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  Continue fentanyl transdermal and oral tramadol  for pain management .she is requesting referral for [facility]

## 2019-06-13 ENCOUNTER — Other Ambulatory Visit: Payer: Self-pay

## 2019-06-13 ENCOUNTER — Ambulatory Visit
Admission: RE | Admit: 2019-06-13 | Discharge: 2019-06-13 | Disposition: A | Discharge status: Home / Self Care | Payer: Medicare Other | Source: Ambulatory Visit | Attending: Internal Medicine | Admitting: Internal Medicine

## 2019-06-13 DIAGNOSIS — Z1231 Encounter for screening mammogram for malignant neoplasm of breast: Secondary | ICD-10-CM | POA: Insufficient documentation

## 2019-06-18 ENCOUNTER — Other Ambulatory Visit: Payer: Self-pay

## 2019-06-18 ENCOUNTER — Encounter: Payer: Self-pay | Admitting: Podiatry

## 2019-06-18 ENCOUNTER — Telehealth: Payer: Self-pay | Admitting: *Deleted

## 2019-06-18 ENCOUNTER — Ambulatory Visit (INDEPENDENT_AMBULATORY_CARE_PROVIDER_SITE_OTHER): Payer: Medicare Other | Admitting: Podiatry

## 2019-06-18 DIAGNOSIS — M79675 Pain in left toe(s): Secondary | ICD-10-CM

## 2019-06-18 DIAGNOSIS — M79674 Pain in right toe(s): Secondary | ICD-10-CM

## 2019-06-18 DIAGNOSIS — L84 Corns and callosities: Secondary | ICD-10-CM | POA: Diagnosis not present

## 2019-06-18 DIAGNOSIS — S98131D Complete traumatic amputation of one right lesser toe, subsequent encounter: Secondary | ICD-10-CM

## 2019-06-18 DIAGNOSIS — B351 Tinea unguium: Secondary | ICD-10-CM | POA: Diagnosis not present

## 2019-06-18 HISTORY — DX: Complete traumatic amputation of one right lesser toe, subsequent encounter: S98.131D

## 2019-06-18 NOTE — Telephone Encounter (Signed)
Pt states she needs to discuss her appt before she comes in today.

## 2019-06-18 NOTE — Progress Notes (Signed)
This patient presents to the office with chief complaint of long thick painful nails.  She states that the nails are painful walking and wearing her shoes.  Patient states that she had her fifth toe on her right foot remove since it was present was painful since the child.  She states she also has painful calluses noted on the fourth toe right foot.  Patient is taking Plavix.  This patient presents for preventative foot care services.  General Appearance  Alert, conversant and in no acute stress.  Vascular  Dorsalis pedis  pulses are palpable  Bilaterally.  Posterior tibial pulses are not palpable  B/L.  Capillary return is within normal limits  bilaterally. Temperature is within normal limits  bilaterally.  Neurologic  Senn-Weinstein monofilament wire test within normal limits  bilaterally. Muscle power within normal limits bilaterally.  Nails Thick disfigured discolored nails with subungual debris  from hallux to the fourth  toes bilaterally.  Thick painful fifth toenails left foot.   Pincer nails noted.No evidence of bacterial infection or drainage bilaterally.  Orthopedic  No limitations of motion  feet .  No crepitus or effusions noted.  No bony pathology or digital deformities noted. Hammer toes  B/L.  Skin  normotropic skin with no porokeratosis noted bilaterally.  No signs of infections or ulcers noted.  Corn on DIPJ 4th toe right.  Distal clavi fourth toe right foot.  Diffuse callus at site of amputation right foot.  Onychomycosis  B/L.  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  IE>  Debride nails  X 9.  Debride corn callus fourth toe/callus surgical site right foot. Padding dispensed fourth toe right.   RTC prn.   Gardiner Barefoot DPM

## 2019-06-19 ENCOUNTER — Ambulatory Visit: Payer: Medicare Other | Admitting: Podiatry

## 2019-06-19 DIAGNOSIS — Z20828 Contact with and (suspected) exposure to other viral communicable diseases: Secondary | ICD-10-CM | POA: Diagnosis not present

## 2019-06-26 ENCOUNTER — Other Ambulatory Visit: Payer: Self-pay | Admitting: Physical Medicine and Rehabilitation

## 2019-06-26 DIAGNOSIS — M25552 Pain in left hip: Secondary | ICD-10-CM | POA: Diagnosis not present

## 2019-06-26 DIAGNOSIS — M5442 Lumbago with sciatica, left side: Secondary | ICD-10-CM | POA: Diagnosis not present

## 2019-06-26 DIAGNOSIS — M48062 Spinal stenosis, lumbar region with neurogenic claudication: Secondary | ICD-10-CM | POA: Diagnosis not present

## 2019-06-26 DIAGNOSIS — M5136 Other intervertebral disc degeneration, lumbar region: Secondary | ICD-10-CM | POA: Diagnosis not present

## 2019-06-26 DIAGNOSIS — M25551 Pain in right hip: Secondary | ICD-10-CM | POA: Diagnosis not present

## 2019-06-26 DIAGNOSIS — G8929 Other chronic pain: Secondary | ICD-10-CM

## 2019-06-26 DIAGNOSIS — M5441 Lumbago with sciatica, right side: Secondary | ICD-10-CM | POA: Diagnosis not present

## 2019-06-26 DIAGNOSIS — M5416 Radiculopathy, lumbar region: Secondary | ICD-10-CM | POA: Diagnosis not present

## 2019-06-28 DIAGNOSIS — M25551 Pain in right hip: Secondary | ICD-10-CM | POA: Diagnosis not present

## 2019-06-28 DIAGNOSIS — M25552 Pain in left hip: Secondary | ICD-10-CM | POA: Diagnosis not present

## 2019-06-28 DIAGNOSIS — Z96643 Presence of artificial hip joint, bilateral: Secondary | ICD-10-CM | POA: Diagnosis not present

## 2019-07-05 ENCOUNTER — Telehealth: Payer: Self-pay

## 2019-07-05 MED ORDER — PREDNISONE 10 MG PO TABS: ORAL_TABLET | ORAL | 0 refills | Status: DC

## 2019-07-05 NOTE — Telephone Encounter (Signed)
Patient said that she does not fell dizzy right now but patient says that her stomach, head, and back is hurting.  Patient said that she can barely walk because of the back pain.  Patient said that she gets dizzy when she gets up.  Patient   Patient said that she believes that her symptoms may be because of the lisinopril or the cod liver oil tablets that she has recently taken.   Patient said that she has been in the bed every day until 6 or 7 pm for the last 2 weeks.  Patient rates her back pain as being greater than 10 when she walks but said it is relieved when she is sitting.  Patient rates her head pain an 8 out of 10.  Patient reported that her bp this morning was 123/64; hr was 65.  Advised patient to go to urgent care given level of pain and since she cannot walk but patient declined stating that she would only be told at [facility] to see her primary care provider.  Patient is requesting for pain medication to be called into pharmacy.  Patient said that the lidocaine patch nor does the fentanyl patch work.  Patient wanted to schedule an in-person office visit w/ Dr. Derrel Nip only.  Informed pt that an in-person appt w/ Dr. Derrel Nip would be further out.  Patient declined scheduling appt w/ another provider and declined a virtual visit/ w Dr. Derrel Nip.  Patient scheduled an OV w/ Dr. Derrel Nip on 07/31/19 @ 4:30 pm.  Patient said that she still has not heard from Dr. Arvella Nigh, orthopedic to schedule appt.

## 2019-07-05 NOTE — Telephone Encounter (Signed)
Copied from [place] [phone]. Topic: Appointment Scheduling - Scheduling Inquiry for Clinic >> Jul 05, 2019 11:16 AM Scherrie Gerlach wrote: Reason for CRM: pt requesting appt with Dr Derrel Nip asap.  Pt states over 2 wks she has had fatigue, when she gets up in the evening she is dizzy.  Not dizzy now. But she just does not feel good and does not know what to do. Declined another provider, she states Dr Derrel Nip knows her and that is only who she wants to see.

## 2019-07-05 NOTE — Telephone Encounter (Signed)
Reviewed note and chart.  Has a history of chronic back pain.  On fentanyl patch and has prn tramadol.  Looks like in process of scheduling and appt with Dr Sharlet Salina ([place]).  Per note, wanting to change pain medication.  Let her know that Dr Derrel Nip is not in the office.  Can forward to her for review.  My concern is with the headache described as 8-10.  Per note, has seen neurology ([name]).  Given severity of headache, I do agree with need for evaluation today to confirm nothing more acute going on.  Also, she can let [name] know about her headache as well.  Will need to forward to Dr Derrel Nip for earlier f/u as well, but agree with evaluation today.

## 2019-07-05 NOTE — Telephone Encounter (Signed)
Spoke with pt to advise her of Dr. Bary Leriche message. Pt stated that saw Dr. Arvella Nigh and he stated that she needed to have some PT, but she has not heard anything from PT. I advised the pt that she should contact Dr. Arvella Nigh office and let them know that she has not heard anything and gave pt the telephone number to call them. Pt stated that she sees Dr. Melrose Nakayama every 6 months and should have an appt coming up soon. I also advise the pt that because of the severe headache she is having that she should be evaluated today and pt stated that if she can not see Dr. Derrel Nip then she will just continue to take the Tramadol so help ease the pain. The pt has an appt with Dr. Derrel Nip on 07/31/2019 but would like to be seen sooner in person. The only appt's available in the afternoons are either 4pm or 4:30pm. Would it be okay to schedule pt in one of those appointments?

## 2019-07-05 NOTE — Telephone Encounter (Signed)
These are all chronic problems: dizziness,  Headaches and neck/back pain. They have all been worked up within the past year ,  Thus the referrals to neurology and orthopedics  for ESI's .  I cannot /will not increase narcotics by phone , especially if she is c/o dizziness again  Because more narcotics will increase her chances of being dizzy .  We can try an 8 day  prednisone taper because it may be related to sinuses/eustachian tube dysfunction (we tried this in the past) .  I will send the prednisone to her pharmacy:  60 mg daily for 3 days,  Then begin to taper by 10 mg daily until gone.

## 2019-07-05 NOTE — Telephone Encounter (Signed)
Dr Nicki Reaper routed this to me regarding one of your patients. It was routed to Dr Derrel Nip as well.

## 2019-07-05 NOTE — Telephone Encounter (Signed)
Spoke with pt and informed her that Dr. Derrel Nip would her to take a 8 day taper of prednisone. Gave pt the directions of use and she repeated them back with understanding. Also scheduled pt for a in office appt on Monday 07/09/2019 at 4:30. Pt is aware.

## 2019-07-05 NOTE — Telephone Encounter (Signed)
Yes,  Ok to use a 4;00 or 4:30 slot. Please review my earlier response and relay to patient  Re prednisone

## 2019-07-07 ENCOUNTER — Ambulatory Visit: Payer: Medicare Other

## 2019-07-09 ENCOUNTER — Other Ambulatory Visit: Payer: Self-pay

## 2019-07-09 ENCOUNTER — Ambulatory Visit (INDEPENDENT_AMBULATORY_CARE_PROVIDER_SITE_OTHER): Payer: Medicare Other | Admitting: Internal Medicine

## 2019-07-09 ENCOUNTER — Encounter: Payer: Self-pay | Admitting: Internal Medicine

## 2019-07-09 VITALS — BP 188/102 | HR 66 | Temp 97.2°F | Resp 15 | Ht 61.0 in | Wt 159.6 lb

## 2019-07-09 DIAGNOSIS — K2950 Unspecified chronic gastritis without bleeding: Secondary | ICD-10-CM

## 2019-07-09 DIAGNOSIS — R42 Dizziness and giddiness: Secondary | ICD-10-CM

## 2019-07-09 DIAGNOSIS — E538 Deficiency of other specified B group vitamins: Secondary | ICD-10-CM

## 2019-07-09 DIAGNOSIS — D649 Anemia, unspecified: Secondary | ICD-10-CM | POA: Diagnosis not present

## 2019-07-09 DIAGNOSIS — F1190 Opioid use, unspecified, uncomplicated: Secondary | ICD-10-CM

## 2019-07-09 DIAGNOSIS — M48062 Spinal stenosis, lumbar region with neurogenic claudication: Secondary | ICD-10-CM

## 2019-07-09 DIAGNOSIS — I10 Essential (primary) hypertension: Secondary | ICD-10-CM

## 2019-07-09 NOTE — Patient Instructions (Signed)
   i'M GLAD THE PREDNISONE IS HELPING.   THAT'S A GOOD SIGN AND MEANS THAT DR CHESNIS'S INJECTIONS SHOULD REALLY HELP IF HE GETS THEM IN THE RIGHT SPOTS   You can reduce your iron to once daily and take it with 3 ounces of orange juice to increase its absorption  GET YOUR FLU VACCINE  A WEEK AFTER YOU FINISH YOUR PREDNISONE INJECTION

## 2019-07-09 NOTE — Progress Notes (Signed)
Subjective:  Patient ID: Sabrina [name], female    DOB: [birth year]  Age: 78 y.o. MRN: [id number]  CC: The primary encounter diagnosis was Anemia, unspecified type. Diagnoses of Chronic gastritis without bleeding, unspecified gastritis type, B12 deficiency, Dizziness, White coat syndrome with hypertension, Lumbar stenosis with neurogenic claudication, and Chronic narcotic use were also pertinent to this visit.  HPI Sabrina [name] presents for follow up on chronic neck and back pain ,  Dizziness , headaches  LAST SEEN AUGUST 10  Patent called the office on Sept 10  with increased pain.  Dizziness and headaches (all chronic) requesting an increase in medications.  She is prescribed Fentanyl and tramadol .  Request was denied , but a prednisone taper was prescribed with follow up planned today.  She is feeling "much much better" today and  States that her headache has resolved,  HTN:  Her home readings have been normal , all < 140/80.   SEEING ORTHOPEDICS AND [facility]   New onset morning sweats,  Doe snot occur during the day .  Not associated with diarreha,  Weight loss,  Cough or malaise. Eats chicken every single day.  No change in diet or meds.   History of IDA.  HAS BEEN TREATED BY HEMATOLOGY for anemia,, trying to take oral iron But cannot tolerate a full glass of orange juice . Doesn't want another iron infusion.  Lab Results  Component Value Date   [id number] 03/06/2018      Outpatient Medications Prior to Visit  Medication Sig Dispense Refill  . aspirin 81 MG tablet Take 81 mg by mouth daily.    . chlordiazePOXIDE (LIBRIUM) 25 MG capsule TAKE ONE CAPSULE BY MOUTH ONCE DAILY AS NEEDED FOR ANXIETY 30 capsule 0  . clopidogrel (PLAVIX) 75 MG tablet Take 1 tablet (75 mg total) by mouth daily. 90 tablet 3  . fentaNYL (DURAGESIC) 50 MCG/HR Place 1 patch onto the skin every 3 (three) days. 30 patch 0  . fentaNYL (DURAGESIC) 50 MCG/HR Place 1 patch onto the skin every 3  (three) days. 10 patch 0  . [START ON 08/03/2019] fentaNYL (DURAGESIC) 50 MCG/HR Place 1 patch onto the skin every 3 (three) days. 10 patch 0  . ferrous sulfate (FEROSUL) 325 (65 FE) MG tablet Take 1 tablet (325 mg total) by mouth daily with breakfast. 90 tablet 0  . hydrALAZINE (APRESOLINE) 50 MG tablet TAKE 1 TABLET BY MOUTH THREE TIMES DAILY AS NEEDED FOR BLOOD PRESSURE GREATER THAN 150 270 tablet 1  . labetalol (NORMODYNE) 200 MG tablet Take 1 tablet (200 mg total) by mouth 2 (two) times daily. 180 tablet 1  . lisinopril (ZESTRIL) 40 MG tablet Take 1 tablet (40 mg total) by mouth 2 (two) times daily. 180 tablet 1  . Multiple Vitamin (MULTIVITAMIN) tablet Take 1 tablet by mouth daily.    . Omega 3 1000 MG CAPS Take 1 capsule by mouth 2 (two) times daily.     . pantoprazole (PROTONIX) 40 MG tablet Take 1 tablet (40 mg total) by mouth daily. 30 tablet 3  . predniSONE (DELTASONE) 10 MG tablet 6 tablets daily for 3 days,   then reduce by 1 tablet daily until gone 33 tablet 0  . traMADol (ULTRAM) 50 MG tablet TAKE TWO TABLETS BY MOUTH EVERY 12 HOURS AS NEEDED max 4 daily 120 tablet 3  . ZETIA 10 MG tablet Take 1 tablet by mouth once daily 30 tablet 5   No facility-administered medications prior to  visit.     Review of Systems;  Patient denies headache, fevers, malaise, unintentional weight loss, skin rash, eye pain, sinus congestion and sinus pain, sore throat, dysphagia,  hemoptysis , cough, dyspnea, wheezing, chest pain, palpitations, orthopnea, edema, abdominal pain, nausea, melena, diarrhea, constipation, flank pain, dysuria, hematuria, urinary  Frequency, nocturia, numbness, tingling, seizures,  Focal weakness, Loss of consciousness,  Tremor, insomnia, depression, anxiety, and suicidal ideation.      Objective:  BP (!) 188/102 (BP Location: Right Arm, Patient Position: Sitting, Cuff Size: Normal)   Pulse 66   Temp (!) 97.2 F (36.2 C) (Temporal)   Resp 15   Ht 5\' 1"  (1.549 m)   Wt 159  lb 9.6 oz (72.4 kg)   SpO2 99%   BMI 30.16 kg/m   BP Readings from Last 3 Encounters:  07/09/19 (!) 188/102  06/18/19 (!) 119/94  05/23/19 (!) 157/80    Wt Readings from Last 3 Encounters:  07/09/19 159 lb 9.6 oz (72.4 kg)  05/23/19 160 lb (72.6 kg)  12/20/18 152 lb 9.6 oz (69.2 kg)    General appearance: alert, cooperative and appears stated age Ears: normal TM's and external ear canals both ears Throat: lips, mucosa, and tongue normal; teeth and gums normal Neck: no adenopathy, no carotid bruit, supple, symmetrical, trachea midline and thyroid not enlarged, symmetric, no tenderness/mass/nodules Back: symmetric, no curvature. ROM normal. No CVA tenderness. Lungs: clear to auscultation bilaterally Heart: regular rate and rhythm, S1, S2 normal, no murmur, click, rub or gallop Abdomen: soft, non-tender; bowel sounds normal; no masses,  no organomegaly Pulses: 2+ and symmetric Skin: Skin color, texture, turgor normal. No rashes or lesions Lymph nodes: Cervical, supraclavicular, and axillary nodes normal.  Lab Results  Component Value Date   HGBA1C 5.9 07/19/2016   HGBA1C 5.8 06/02/2015    Lab Results  Component Value Date   CREATININE 1.32 (H) 05/23/2019   CREATININE 1.22 (H) 12/21/2018   CREATININE 1.10 02/22/2018    Lab Results  Component Value Date   WBC 5.9 05/23/2019   HGB 10.9 (L) 05/23/2019   HCT 33.7 (L) 05/23/2019   PLT 205 05/23/2019   GLUCOSE 112 (H) 05/23/2019   CHOL 167 02/22/2018   TRIG 53.0 02/22/2018   HDL 54.80 02/22/2018   LDLDIRECT 109.0 03/05/2016   LDLCALC 102 (H) 02/22/2018   ALT 16 05/23/2019   AST 18 05/23/2019   NA 139 05/23/2019   K 4.9 05/23/2019   CL 104 05/23/2019   CREATININE 1.32 (H) 05/23/2019   BUN 25 (H) 05/23/2019   CO2 28 05/23/2019   TSH 4.46 03/06/2018   INR 1.0 03/27/2014   HGBA1C 5.9 07/19/2016    Mm 3d Screen Breast Bilateral  Result Date: 06/13/2019 CLINICAL DATA:  Screening. EXAM: DIGITAL SCREENING BILATERAL  MAMMOGRAM WITH TOMO AND CAD COMPARISON:  Previous exam(s). ACR Breast Density Category d: The breast tissue is extremely dense, which lowers the sensitivity of mammography FINDINGS: There are no findings suspicious for malignancy. Images were processed with CAD. IMPRESSION: No mammographic evidence of malignancy. A result letter of this screening mammogram will be mailed directly to the patient. RECOMMENDATION: Screening mammogram in one year. (Code:SM-B-01Y) BI-RADS CATEGORY  1: Negative. Electronically Signed   By: Ammie Ferrier M.D.   On: 06/13/2019 15:22    Assessment & Plan:   Problem List Items Addressed This Visit      Unprioritized   White coat syndrome with hypertension    Her  BP is not at goal currently.  Medications reviewed and compliance assessed via patient report.  Renal function and electolytes assessed.  Use of NSAIDs, oral decongestants and other medications/supplements reviewed  . She reports that her home readings have been  Excellent"      Chronic narcotic use    .Marland KitchenShe has not had any ER visits  And has not requested any early refills.  Her Refill history was confirmed via [place] Controlled Substance database by me today during her visit and there have been no prescriptions of controlled substances filled from any providers other than me. .       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  Continue fentanyl transdermal and oral tramadol  for pain management . Referral to [facility] for ESI in process      Anemia - Primary    Iron stores were normal per hematology but would need increasing prior to Epogen shot which is being considered given persistent anemia.        Other Visit Diagnoses    Chronic gastritis without bleeding, unspecified gastritis type       B12 deficiency       Relevant Orders   Vitamin B12       I am having Sabrina [name] maintain her aspirin, Omega 3, multivitamin, hydrALAZINE, traMADol, labetalol, Zetia, pantoprazole, chlordiazePOXIDE, clopidogrel, lisinopril, ferrous sulfate, fentaNYL, fentaNYL, fentaNYL, and predniSONE.  No orders of the defined types were placed in this encounter.   There are no discontinued medications.  Follow-up: No follow-ups on file.   Crecencio Mc, MD

## 2019-07-10 NOTE — Assessment & Plan Note (Signed)
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  Continue fentanyl transdermal and oral tramadol  for pain management . Referral to [facility] for ESI in process

## 2019-07-10 NOTE — Assessment & Plan Note (Signed)
..  She has not had any ER visits  And has not requested any early refills.  Her Refill history was confirmed via [place] Controlled Substance database by me today during her visit and there have been no prescriptions of controlled substances filled from any providers other than me. .  

## 2019-07-10 NOTE — Assessment & Plan Note (Signed)
Her  BP is not at goal currently.  Medications reviewed and compliance assessed via patient report.  Renal function and electolytes assessed.  Use of NSAIDs, oral decongestants and other medications/supplements reviewed  . She reports that her home readings have been  Excellent"

## 2019-07-10 NOTE — Assessment & Plan Note (Signed)
Iron stores were normal per hematology but would need increasing prior to Epogen shot which is being considered given persistent anemia.  

## 2019-07-10 NOTE — Assessment & Plan Note (Signed)
Improved with prednisone taper

## 2019-07-13 ENCOUNTER — Telehealth: Payer: Self-pay | Admitting: Internal Medicine

## 2019-07-13 NOTE — Telephone Encounter (Signed)
Refills for fentanyl should be on file fat her pharmacy  For September and October

## 2019-07-13 NOTE — Telephone Encounter (Signed)
RX REFILL fentaNYL ([place]) [place] (N), [place] [phone] (Phone) [phone] (Fax)

## 2019-07-16 ENCOUNTER — Telehealth: Payer: Self-pay

## 2019-07-16 NOTE — Telephone Encounter (Signed)
Patient notified

## 2019-07-16 NOTE — Telephone Encounter (Signed)
Copied from [place] [phone]. Topic: General - Other >> Jul 16, 2019 12:25 PM Sabrina [name] wrote: Reason for CRM: Pt called and is requesting to have Rasheeda call her back. Please advise.

## 2019-07-19 ENCOUNTER — Ambulatory Visit
Admission: RE | Admit: 2019-07-19 | Discharge: 2019-07-19 | Disposition: A | Discharge status: Home / Self Care | Payer: Medicare Other | Source: Ambulatory Visit | Attending: Physical Medicine and Rehabilitation | Admitting: Physical Medicine and Rehabilitation

## 2019-07-19 ENCOUNTER — Other Ambulatory Visit: Payer: Self-pay

## 2019-07-19 DIAGNOSIS — M5442 Lumbago with sciatica, left side: Secondary | ICD-10-CM | POA: Insufficient documentation

## 2019-07-19 DIAGNOSIS — M545 Low back pain: Secondary | ICD-10-CM | POA: Diagnosis not present

## 2019-07-19 DIAGNOSIS — G8929 Other chronic pain: Secondary | ICD-10-CM | POA: Diagnosis not present

## 2019-07-19 DIAGNOSIS — M5441 Lumbago with sciatica, right side: Secondary | ICD-10-CM | POA: Diagnosis not present

## 2019-07-26 ENCOUNTER — Telehealth: Payer: Self-pay | Admitting: Internal Medicine

## 2019-07-26 NOTE — Telephone Encounter (Signed)
Pt would like a call she has a question regarding her iron. Pt would like to discuss her iron. Please advise? Thank you!  Call pt @ [phone].

## 2019-07-31 ENCOUNTER — Ambulatory Visit: Payer: Medicare Other | Admitting: Internal Medicine

## 2019-07-31 ENCOUNTER — Other Ambulatory Visit (INDEPENDENT_AMBULATORY_CARE_PROVIDER_SITE_OTHER): Payer: Medicare Other

## 2019-07-31 ENCOUNTER — Other Ambulatory Visit: Payer: Self-pay

## 2019-07-31 DIAGNOSIS — E538 Deficiency of other specified B group vitamins: Secondary | ICD-10-CM

## 2019-07-31 DIAGNOSIS — Z23 Encounter for immunization: Secondary | ICD-10-CM

## 2019-08-01 LAB — VITAMIN B12: Vitamin B-12: 580 pg/mL (ref 211–911)

## 2019-08-02 NOTE — Telephone Encounter (Signed)
Pt is wanting to know how many mg of Iron she should be taking once daily?

## 2019-08-02 NOTE — Telephone Encounter (Signed)
The rx is in her chart as a prescription

## 2019-08-02 NOTE — Telephone Encounter (Signed)
Once daily with 4 ounces of orange juice

## 2019-08-06 DIAGNOSIS — I10 Essential (primary) hypertension: Secondary | ICD-10-CM

## 2019-08-07 MED ORDER — LABETALOL HCL 200 MG PO TABS: 200.0000 mg | ORAL_TABLET | Freq: Two times a day (BID) | ORAL | 1 refills | Status: DC

## 2019-08-07 MED ORDER — CHLORDIAZEPOXIDE HCL 25 MG PO CAPS: ORAL_CAPSULE | ORAL | 5 refills | Status: DC

## 2019-08-07 NOTE — Telephone Encounter (Signed)
Spoke with pt and informed her that she is to take her iron supplement once daily with 3 to 4 ounces of orange juice. Pt gave a verbal understanding.

## 2019-08-22 DIAGNOSIS — M6281 Muscle weakness (generalized): Secondary | ICD-10-CM | POA: Diagnosis not present

## 2019-08-22 DIAGNOSIS — M25552 Pain in left hip: Secondary | ICD-10-CM | POA: Diagnosis not present

## 2019-08-22 DIAGNOSIS — M25551 Pain in right hip: Secondary | ICD-10-CM | POA: Diagnosis not present

## 2019-08-30 NOTE — Telephone Encounter (Signed)
Last refill was 08/03/19.

## 2019-08-31 MED ORDER — FENTANYL 50 MCG/HR TD PT72: 1.0000 | MEDICATED_PATCH | TRANSDERMAL | 0 refills | Status: DC

## 2019-09-07 DIAGNOSIS — G8929 Other chronic pain: Secondary | ICD-10-CM | POA: Diagnosis not present

## 2019-09-07 DIAGNOSIS — M48062 Spinal stenosis, lumbar region with neurogenic claudication: Secondary | ICD-10-CM | POA: Diagnosis not present

## 2019-09-07 DIAGNOSIS — M5136 Other intervertebral disc degeneration, lumbar region: Secondary | ICD-10-CM | POA: Diagnosis not present

## 2019-09-07 DIAGNOSIS — M5416 Radiculopathy, lumbar region: Secondary | ICD-10-CM | POA: Diagnosis not present

## 2019-09-07 DIAGNOSIS — M25551 Pain in right hip: Secondary | ICD-10-CM | POA: Diagnosis not present

## 2019-09-07 DIAGNOSIS — M25552 Pain in left hip: Secondary | ICD-10-CM | POA: Diagnosis not present

## 2019-09-07 DIAGNOSIS — M5441 Lumbago with sciatica, right side: Secondary | ICD-10-CM | POA: Diagnosis not present

## 2019-09-07 DIAGNOSIS — M5442 Lumbago with sciatica, left side: Secondary | ICD-10-CM | POA: Diagnosis not present

## 2019-09-12 MED ORDER — LISINOPRIL 20 MG PO TABS: 20.0000 mg | ORAL_TABLET | Freq: Two times a day (BID) | ORAL | 1 refills | Status: DC

## 2019-09-12 MED ORDER — NORTRIPTYLINE HCL 10 MG PO CAPS: 10.0000 mg | ORAL_CAPSULE | Freq: Every day | ORAL | 1 refills | Status: DC

## 2019-09-13 ENCOUNTER — Encounter: Payer: Self-pay | Admitting: Oncology

## 2019-09-13 ENCOUNTER — Other Ambulatory Visit: Payer: Self-pay

## 2019-09-13 NOTE — Progress Notes (Signed)
Patient stated that she had been doing well with no concerns. 

## 2019-09-14 ENCOUNTER — Inpatient Hospital Stay (HOSPITAL_BASED_OUTPATIENT_CLINIC_OR_DEPARTMENT_OTHER): Payer: Medicare Other | Admitting: Oncology

## 2019-09-14 ENCOUNTER — Other Ambulatory Visit: Payer: Self-pay

## 2019-09-14 ENCOUNTER — Inpatient Hospital Stay: Payer: Medicare Other | Attending: Oncology

## 2019-09-14 VITALS — BP 132/56 | HR 82 | Temp 97.8°F | Wt 157.0 lb

## 2019-09-14 DIAGNOSIS — Z886 Allergy status to analgesic agent status: Secondary | ICD-10-CM | POA: Diagnosis not present

## 2019-09-14 DIAGNOSIS — G894 Chronic pain syndrome: Secondary | ICD-10-CM | POA: Diagnosis not present

## 2019-09-14 DIAGNOSIS — Z833 Family history of diabetes mellitus: Secondary | ICD-10-CM | POA: Diagnosis not present

## 2019-09-14 DIAGNOSIS — E785 Hyperlipidemia, unspecified: Secondary | ICD-10-CM | POA: Insufficient documentation

## 2019-09-14 DIAGNOSIS — Z823 Family history of stroke: Secondary | ICD-10-CM | POA: Insufficient documentation

## 2019-09-14 DIAGNOSIS — Z888 Allergy status to other drugs, medicaments and biological substances status: Secondary | ICD-10-CM | POA: Diagnosis not present

## 2019-09-14 DIAGNOSIS — D631 Anemia in chronic kidney disease: Secondary | ICD-10-CM | POA: Insufficient documentation

## 2019-09-14 DIAGNOSIS — N1831 Chronic kidney disease, stage 3a: Secondary | ICD-10-CM

## 2019-09-14 DIAGNOSIS — D509 Iron deficiency anemia, unspecified: Secondary | ICD-10-CM

## 2019-09-14 DIAGNOSIS — Z8249 Family history of ischemic heart disease and other diseases of the circulatory system: Secondary | ICD-10-CM | POA: Diagnosis not present

## 2019-09-14 DIAGNOSIS — M549 Dorsalgia, unspecified: Secondary | ICD-10-CM | POA: Insufficient documentation

## 2019-09-14 DIAGNOSIS — E611 Iron deficiency: Secondary | ICD-10-CM | POA: Diagnosis not present

## 2019-09-14 DIAGNOSIS — R5382 Chronic fatigue, unspecified: Secondary | ICD-10-CM | POA: Insufficient documentation

## 2019-09-14 DIAGNOSIS — Z87891 Personal history of nicotine dependence: Secondary | ICD-10-CM | POA: Insufficient documentation

## 2019-09-14 DIAGNOSIS — Z820 Family history of epilepsy and other diseases of the nervous system: Secondary | ICD-10-CM | POA: Insufficient documentation

## 2019-09-14 DIAGNOSIS — Z79899 Other long term (current) drug therapy: Secondary | ICD-10-CM | POA: Insufficient documentation

## 2019-09-14 DIAGNOSIS — Z885 Allergy status to narcotic agent status: Secondary | ICD-10-CM | POA: Insufficient documentation

## 2019-09-14 DIAGNOSIS — Z803 Family history of malignant neoplasm of breast: Secondary | ICD-10-CM | POA: Insufficient documentation

## 2019-09-14 DIAGNOSIS — Z880 Allergy status to penicillin: Secondary | ICD-10-CM | POA: Insufficient documentation

## 2019-09-14 LAB — CBC WITH DIFFERENTIAL/PLATELET
Abs Immature Granulocytes: 0.01 10*3/uL (ref 0.00–0.07)
Basophils Absolute: 0 10*3/uL (ref 0.0–0.1)
Basophils Relative: 1 %
Eosinophils Absolute: 0.8 10*3/uL — ABNORMAL HIGH (ref 0.0–0.5)
Eosinophils Relative: 14 %
HCT: 33.9 % — ABNORMAL LOW (ref 36.0–46.0)
Hemoglobin: 11 g/dL — ABNORMAL LOW (ref 12.0–15.0)
Immature Granulocytes: 0 %
Lymphocytes Relative: 37 %
Lymphs Abs: 2 10*3/uL (ref 0.7–4.0)
MCH: 28.6 pg (ref 26.0–34.0)
MCHC: 32.4 g/dL (ref 30.0–36.0)
MCV: 88.1 fL (ref 80.0–100.0)
Monocytes Absolute: 0.5 10*3/uL (ref 0.1–1.0)
Monocytes Relative: 10 %
Neutro Abs: 2.1 10*3/uL (ref 1.7–7.7)
Neutrophils Relative %: 38 %
Platelets: 202 10*3/uL (ref 150–400)
RBC: 3.85 MIL/uL — ABNORMAL LOW (ref 3.87–5.11)
RDW: 13 % (ref 11.5–15.5)
WBC: 5.5 10*3/uL (ref 4.0–10.5)
nRBC: 0 % (ref 0.0–0.2)

## 2019-09-14 LAB — FERRITIN: Ferritin: 128 ng/mL (ref 11–307)

## 2019-09-14 LAB — COMPREHENSIVE METABOLIC PANEL
ALT: 17 U/L (ref 0–44)
AST: 20 U/L (ref 15–41)
Albumin: 3.9 g/dL (ref 3.5–5.0)
Alkaline Phosphatase: 65 U/L (ref 38–126)
Anion gap: 7 (ref 5–15)
BUN: 17 mg/dL (ref 8–23)
CO2: 26 mmol/L (ref 22–32)
Calcium: 9.1 mg/dL (ref 8.9–10.3)
Chloride: 102 mmol/L (ref 98–111)
Creatinine, Ser: 1.04 mg/dL — ABNORMAL HIGH (ref 0.44–1.00)
GFR calc Af Amer: >60 mL/min (ref >60)
GFR calc non Af Amer: 52 mL/min — ABNORMAL LOW (ref >60)
Glucose, Bld: 122 mg/dL — ABNORMAL HIGH (ref 70–99)
Potassium: 4 mmol/L (ref 3.5–5.1)
Sodium: 135 mmol/L (ref 135–145)
Total Bilirubin: 0.6 mg/dL (ref 0.3–1.2)
Total Protein: 7.1 g/dL (ref 6.5–8.1)

## 2019-09-14 LAB — IRON AND TIBC
Iron: 74 ug/dL (ref 28–170)
Saturation Ratios: 24 % (ref 10.4–31.8)
TIBC: 305 ug/dL (ref 250–450)
UIBC: 231 ug/dL

## 2019-09-14 NOTE — Progress Notes (Signed)
Hematology/Oncology follow up  note [facility] [phone] [phone]) [phone]   Patient Care Team: Sabrina Mc, MD as PCP - General (Internal Medicine)  REFERRING PROVIDER: Crecencio Mc, MD REASON FOR VISIT Follow up for management of anemia   HISTORY OF PRESENTING ILLNESS:  Sabrina [name] is a  78 y.o.  female with PMH listed below who was referred to me for evaluation of anemia.  Patient recently had lab work done which revealed anemia with hemoglobin 11.5.   Reviewed patient's previous labs, her hemoglobin ranges from 10.8 to 11.8 in the past.  Reports feeling fatigue.  Patient denies weight loss, easy bruising, hematochezia, hemoptysis.  Has history of CKD and follows up with nephrologist.  Chronic pain syndrome. On Tramadol and fentanyl patch.  INTERVAL HISTORY Sabrina [name] is a 78 y.o. female who has above history reviewed by me today presents for follow up visit for management of anemia.  Patient reports doing well.  Chronic fatigue is at baseline.  Not worse. Patient has chronic back pain.  Follows up with primary care provider.  No new complaints today.   Review of Systems  Constitutional: Positive for malaise/fatigue. Negative for chills, fever and weight loss.  HENT: Negative for sore throat.   Eyes: Negative for redness.  Respiratory: Negative for cough, shortness of breath and wheezing.   Cardiovascular: Negative for chest pain, palpitations and leg swelling.  Gastrointestinal: Negative for abdominal pain, blood in stool, nausea and vomiting.  Genitourinary: Negative for dysuria.  Musculoskeletal: Positive for back pain. Negative for myalgias.  Skin: Negative for rash.  Neurological: Negative for dizziness, tingling and tremors.  Endo/Heme/Allergies: Does not bruise/bleed easily.  Psychiatric/Behavioral: Negative for hallucinations.    MEDICAL HISTORY:  Past Medical History:  Diagnosis Date  . Anemia of  chronic kidney failure   . Anxiety   . CAD (coronary artery disease)   . Carotid artery stenosis   . Chronic kidney disease, stage III (moderate)    Followed by Dr. Juleen China  . Hyperlipidemia   . Hypertension   . Renal artery stenosis ([place])   . Secondary hyperparathyroidism ([place])   . 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 ABDOMINAL HYSTERECTOMY  1976  . CAROTID ARTERY ANGIOPLASTY Left   . CAROTID ENDARTERECTOMY Left   . CHOLECYSTECTOMY    . COLONOSCOPY WITH PROPOFOL N/A 08/16/2017   Procedure: COLONOSCOPY WITH PROPOFOL;  Surgeon: [name], MD;  Location: [facility] ENDOSCOPY;  Service: Endoscopy;  Laterality: N/A;  . CORONARY ANGIOPLASTY WITH STENT PLACEMENT  2000  . CORONARY ARTERY BYPASS GRAFT  2000  . CYSTOSCOPY WITH STENT PLACEMENT Bilateral   . ESOPHAGOGASTRODUODENOSCOPY (EGD) WITH PROPOFOL N/A 08/16/2017   Procedure: ESOPHAGOGASTRODUODENOSCOPY (EGD) WITH PROPOFOL;  Surgeon: [name], MD;  Location: [facility] ENDOSCOPY;  Service: Endoscopy;  Laterality: N/A;  . ESOPHAGOGASTRODUODENOSCOPY (EGD) WITH PROPOFOL N/A 06/29/2018   Procedure: ESOPHAGOGASTRODUODENOSCOPY (EGD) WITH PROPOFOL;  Surgeon: Virgel Manifold, MD;  Location: [facility] ENDOSCOPY;  Service: Endoscopy;  Laterality: N/A;  . JOINT REPLACEMENT Bilateral 2000  . RENAL ARTERY ANGIOPLASTY Bilateral mARCH 2015  . TOE AMPUTATION Right    small toe  . TONSILLECTOMY AND ADENOIDECTOMY    . TOTAL HIP ARTHROPLASTY Left   . TOTAL HIP ARTHROPLASTY Right 2015    SOCIAL HISTORY: Social History   Socioeconomic History  . Marital status: Divorced  Spouse name: Not on file  . Number of children: Not on file  . Years of education: Not on file  . Highest education level: Not on file  Occupational History  . Occupation: retired  Scientific laboratory technician  . Financial resource strain: Not hard at all  . Food  insecurity    Worry: Never true    Inability: Never true  . Transportation needs    Medical: No    Non-medical: No  Tobacco Use  . Smoking status: Former Smoker    Quit date: 10/25/1998    Years since quitting: 20.9  . Smokeless tobacco: Never Used  Substance and Sexual Activity  . Alcohol use: No    Alcohol/week: 0.0 standard drinks  . Drug use: Never  . Sexual activity: Not Currently  Lifestyle  . Physical activity    Days per week: 0 days    Minutes per session: 0 min  . Stress: Patient refused  Relationships  . Social Herbalist on phone: Patient refused    Gets together: Patient refused    Attends religious service: Patient refused    Active member of club or organization: Patient refused    Attends meetings of clubs or organizations: Patient refused    Relationship status: Patient refused  . Intimate partner violence    Fear of current or ex partner: No    Emotionally abused: No    Physically abused: No    Forced sexual activity: No  Other Topics Concern  . Not on file  Social History Narrative  . Not on file    FAMILY HISTORY: Family History  Problem Relation Age of Onset  . Stroke Mother   . Hypertension Mother   . Diabetes Mother   . Hypertension Father   . Heart disease Sister        MI  . Multiple sclerosis Daughter   . Multiple sclerosis Son   . Cerebral aneurysm Son   . Seizures Son   . Cerebral aneurysm Son   . Breast cancer Paternal Aunt 40    ALLERGIES:  is allergic to citalopram; dilaudid [hydromorphone hcl]; hydrochlorothiazide; nsaids; nubain [nalbuphine hcl]; penicillins; prasugrel; and statins.  MEDICATIONS:  Current Outpatient Medications  Medication Sig Dispense Refill  . aspirin 81 MG tablet Take 81 mg by mouth daily.    . chlordiazePOXIDE (LIBRIUM) 25 MG capsule TAKE ONE CAPSULE BY MOUTH ONCE DAILY AS NEEDED FOR ANXIETY 30 capsule 5  . clopidogrel (PLAVIX) 75 MG tablet Take 1 tablet (75 mg total) by mouth daily. 90  tablet 3  . fentaNYL (DURAGESIC) 50 MCG/HR Place 1 patch onto the skin every 3 (three) days. 30 patch 0  . fentaNYL (DURAGESIC) 50 MCG/HR Place 1 patch onto the skin every 3 (three) days. 10 patch 0  . ferrous sulfate (FEROSUL) 325 (65 FE) MG tablet Take 1 tablet (325 mg total) by mouth daily with breakfast. 90 tablet 0  . hydrALAZINE (APRESOLINE) 50 MG tablet TAKE 1 TABLET BY MOUTH THREE TIMES DAILY AS NEEDED FOR BLOOD PRESSURE GREATER THAN 150 270 tablet 1  . labetalol (NORMODYNE) 200 MG tablet Take 1 tablet (200 mg total) by mouth 2 (two) times daily. 180 tablet 1  . lisinopril (ZESTRIL) 20 MG tablet Take 1 tablet (20 mg total) by mouth 2 (two) times daily. 180 tablet 1  . nortriptyline (PAMELOR) 10 MG capsule Take 1 capsule (10 mg total) by mouth at bedtime. 90 capsule 1  . Omega 3 1000 MG CAPS  Take 1 capsule by mouth 2 (two) times daily.     . traMADol (ULTRAM) 50 MG tablet TAKE TWO TABLETS BY MOUTH EVERY 12 HOURS AS NEEDED max 4 daily 120 tablet 3   No current facility-administered medications for this visit.      PHYSICAL EXAMINATION: ECOG PERFORMANCE STATUS: 1 - Symptomatic but completely ambulatory Vitals:   09/14/19 1338  BP: (!) 132/56  Pulse: 82  Temp: 97.8 F (36.6 C)   Filed Weights   09/14/19 1335 09/14/19 1338  Weight: 167 lb (75.8 kg) 157 lb (71.2 kg)    Physical Exam Constitutional:      General: She is not in acute distress.    Appearance: She is well-developed.  HENT:     Head: Normocephalic and atraumatic.     Right Ear: External ear normal.     Left Ear: External ear normal.  Eyes:     General: No scleral icterus.    Conjunctiva/sclera: Conjunctivae normal.     Pupils: Pupils are equal, round, and reactive to light.  Neck:     Musculoskeletal: Normal range of motion and neck supple.  Cardiovascular:     Rate and Rhythm: Normal rate and regular rhythm.     Heart sounds: Normal heart sounds.  Pulmonary:     Effort: Pulmonary effort is normal. No  respiratory distress.     Breath sounds: Normal breath sounds. No wheezing or rales.  Chest:     Chest wall: No tenderness.  Abdominal:     General: Bowel sounds are normal. There is no distension.     Palpations: Abdomen is soft. There is no mass.     Tenderness: There is no abdominal tenderness.  Musculoskeletal: Normal range of motion.        General: No deformity.  Lymphadenopathy:     Cervical: No cervical adenopathy.  Skin:    General: Skin is warm and dry.     Findings: No erythema or rash.  Neurological:     Mental Status: She is alert and oriented to person, place, and time.     Cranial Nerves: No cranial nerve deficit.     Coordination: Coordination normal.  Psychiatric:        Behavior: Behavior normal.        Thought Content: Thought content normal.      LABORATORY DATA:  I have reviewed the data as listed Lab Results  Component Value Date   WBC 5.5 09/14/2019   HGB 11.0 (L) 09/14/2019   HCT 33.9 (L) 09/14/2019   MCV 88.1 09/14/2019   PLT 202 09/14/2019   Recent Labs    12/21/18 1434 05/23/19 1256 09/14/19 1255  NA 135 139 135  K 5.0 4.9 4.0  CL 102 104 102  CO2 25 28 26   GLUCOSE 105* 112* 122*  BUN 24* 25* 17  CREATININE 1.22* 1.32* 1.04*  CALCIUM 9.5 9.4 9.1  GFRNONAA  --  39* 52*  GFRAA  --  45* >60  PROT 7.5 7.2 7.1  ALBUMIN 4.0 3.7 3.9  AST 26 18 20   ALT 16 16 17   ALKPHOS 70 72 65  BILITOT 0.4 0.5 0.6       ASSESSMENT & PLAN:  1. Iron deficiency anemia, unspecified iron deficiency anemia type   2. Anemia due to stage 3a chronic kidney disease   Labs reviewed and discussed with patient. Hemoglobin is stable at 11.  Iron panel shows adequate iron stores with iron saturation 24, ferritin 128.  Hold  additional IV iron at this point. Recommend patient to continue take oral iron supplementation ferrous sulfate 325 mg daily.   Return of visit: 6 months. Earlie Server, MD, PhD Hematology Oncology [place] at [facility]  09/14/2019

## 2019-09-17 ENCOUNTER — Other Ambulatory Visit: Payer: Medicare Other

## 2019-09-17 ENCOUNTER — Ambulatory Visit: Payer: Medicare Other | Admitting: Oncology

## 2019-09-28 ENCOUNTER — Encounter (INDEPENDENT_AMBULATORY_CARE_PROVIDER_SITE_OTHER): Payer: Medicare Other

## 2019-09-28 ENCOUNTER — Ambulatory Visit (INDEPENDENT_AMBULATORY_CARE_PROVIDER_SITE_OTHER): Payer: Medicare Other | Admitting: Nurse Practitioner

## 2019-10-01 ENCOUNTER — Other Ambulatory Visit: Payer: Self-pay | Admitting: Internal Medicine

## 2019-10-01 NOTE — Telephone Encounter (Signed)
Looks like Zetia has been discontinued.   Tramadol    Refilled: 12/20/2018   Last OV: 07/09/2019   Next OV: 10/09/2019

## 2019-10-03 ENCOUNTER — Other Ambulatory Visit: Payer: Self-pay

## 2019-10-03 ENCOUNTER — Ambulatory Visit (INDEPENDENT_AMBULATORY_CARE_PROVIDER_SITE_OTHER): Payer: Medicare Other

## 2019-10-03 ENCOUNTER — Encounter (INDEPENDENT_AMBULATORY_CARE_PROVIDER_SITE_OTHER): Payer: Self-pay | Admitting: Nurse Practitioner

## 2019-10-03 ENCOUNTER — Ambulatory Visit (INDEPENDENT_AMBULATORY_CARE_PROVIDER_SITE_OTHER): Payer: Medicare Other | Admitting: Nurse Practitioner

## 2019-10-03 VITALS — BP 151/71 | HR 80 | Resp 16 | Ht 61.0 in | Wt 161.0 lb

## 2019-10-03 DIAGNOSIS — I6523 Occlusion and stenosis of bilateral carotid arteries: Secondary | ICD-10-CM

## 2019-10-03 DIAGNOSIS — I739 Peripheral vascular disease, unspecified: Secondary | ICD-10-CM | POA: Diagnosis not present

## 2019-10-03 DIAGNOSIS — I714 Abdominal aortic aneurysm, without rupture, unspecified: Secondary | ICD-10-CM

## 2019-10-04 ENCOUNTER — Other Ambulatory Visit: Payer: Self-pay | Admitting: Internal Medicine

## 2019-10-04 NOTE — Telephone Encounter (Signed)
Requested medication (s) are due for refill today: yes  Requested medication (s) are on the active medication list: yes  Last refill:  06/04/2019  Future visit scheduled: yes  Notes to clinic: patient has some questions regarding nortiptyline  Requested Prescriptions  Pending Prescriptions Disp Refills   nortriptyline (PAMELOR) 10 MG capsule 90 capsule 1    Sig: Take 1 capsule (10 mg total) by mouth at bedtime.      Psychiatry:  Antidepressants - Heterocyclics (TCAs) Failed - 10/04/2019  2:49 PM      Failed - Completed PHQ-2 or PHQ-9 in the last 360 days.      Passed - Valid encounter within last 6 months    Recent Outpatient Visits           2 months ago Anemia, unspecified type   [place] Crecencio Mc, MD   4 months ago Dystrophic nail   [place] Crecencio Mc, MD   5 months ago Swelling of right hand   [place] Crecencio Mc, MD   6 months ago Educated About [place] Crecencio Mc, MD   9 months ago Essential hypertension   [place] Crecencio Mc, MD       Future Appointments             In 5 days Crecencio Mc, MD [place]              fentaNYL (DURAGESIC) 50 MCG/HR 30 patch 0    Sig: Place 1 patch onto the skin every 3 (three) days.      Not Delegated - Analgesics:  Opioid Agonists Failed - 10/04/2019  2:49 PM      Failed - This refill cannot be delegated      Failed - Urine Drug Screen completed in last 360 days.      Passed - Valid encounter within last 6 months    Recent Outpatient Visits           2 months ago Anemia, unspecified type   [place] Crecencio Mc, MD   4 months ago Dystrophic nail   [place] Crecencio Mc, MD   5 months ago Swelling of right hand   [place] Crecencio Mc, MD   6  months ago Educated About [place], MD   9 months ago Essential hypertension   [place], [place], MD       Future Appointments             In 5 days Derrel Nip Aris Everts, MD [facility], [facility]

## 2019-10-04 NOTE — Telephone Encounter (Signed)
Copied from [place] [id number]. Topic: Quick Communication - Rx Refill/Question >> Oct 04, 2019  2:41 PM Celene Kras wrote: Medication: Fentanyl   Has the patient contacted their pharmacy? Yes.   Pt is also requesting clarification on her nortriptyline. Pt states she is on her last pill for the fentanyl. Please advise.  (Agent: If no, request that the patient contact the pharmacy for the refill.) (Agent: If yes, when and what did the pharmacy advise?)  Preferred Pharmacy (with phone number or street name): [facility] (N[facility] [place] [address] [place]) [place] [address] Phone: [phone] Fax: [phone] Not a 24 hour pharmacy; exact hours not known.    Agent: Please be advised that RX refills may take up to 3 business days. We ask that you follow-up with your pharmacy.

## 2019-10-05 ENCOUNTER — Telehealth: Payer: Self-pay | Admitting: Internal Medicine

## 2019-10-05 MED ORDER — FENTANYL 50 MCG/HR TD PT72: 1.0000 | MEDICATED_PATCH | TRANSDERMAL | 0 refills | Status: DC

## 2019-10-05 MED ORDER — NORTRIPTYLINE HCL 10 MG PO CAPS: 10.0000 mg | ORAL_CAPSULE | Freq: Every day | ORAL | 1 refills | Status: DC

## 2019-10-05 NOTE — Telephone Encounter (Signed)
[facility] [address] ([address]), [place] - [address]  [place] ([place]) [place] [address]  Phone: [phone] Fax: [phone]    called states patients insurance will not cover medication . Please renew after 30 day supply   fentaNYL ([place]) 50 MCG/HR [id number]

## 2019-10-05 NOTE — Telephone Encounter (Signed)
Last refill was Nov 8,  She should be able to refill fentanyl today .  There is nothing else I can do without more clarification

## 2019-10-05 NOTE — Telephone Encounter (Signed)
Fentanyl   Refilled: 06/04/2019 Nortriptyline   Refilled: 09/12/2019  Last OV: 07/09/2019 Next OV: 10/09/2019

## 2019-10-05 NOTE — Telephone Encounter (Signed)
Spoke with pt and she stated that the pharmacy called her today and told her that her rx was ready to be picked up and that she was on her way to the pharmacy now. Pt stated that she would call us and let us know if something wasn't right when she got to the pharmacy.

## 2019-10-07 ENCOUNTER — Encounter (INDEPENDENT_AMBULATORY_CARE_PROVIDER_SITE_OTHER): Payer: Self-pay | Admitting: Nurse Practitioner

## 2019-10-07 NOTE — Progress Notes (Signed)
SUBJECTIVE:  Patient ID: Sabrina [name], female    DOB: Oct 17, 1941, 78 y.o.   MRN: [id number] Chief Complaint  Patient presents with  . Follow-up    ultrasound     HPI  Sabrina [name] is a 78 y.o. female that presents today for her annual follow-up for peripheral arterial disease as well as her carotid artery stenosis.  The patient denies any major changes over the last year however she does endorse having some pain in her groin area.  She states that this pain is terrible for her.  It happens when she walks, sneezes or moves.  She is scheduled to have an injection in her back on 10/23/2019.  She states that this groin pain is somewhat lifestyle limiting for her.  She denies any fever, chills, nausea, vomiting or diarrhea.  She denies any TIA-like symptoms thrombosis ejects.  She denies any chest pain or shortness of breath.  Today noninvasive studies show that the patient right internal carotid artery continues to be occluded with a 1 to 39% stenosis of her left carotid artery.  The patient's bilateral ABIs are 0.58 on the right and 0.51 on the left.  The patient has strong monophasic waveforms in the bilateral tibial arteries.  The left toe digit is severely dampened with dampened left toe waveforms.  Patient also underwent an abdominal aortic study which showed her largest aortic measurement at 2.9 cm.  This is consistent with previous studies.  The patient has biphasic/triphasic waveforms in the right common and external iliac arteries.  The left common and external iliac arteries have monophasic waveforms.  The patient has elevated velocities in the bilateral common and external iliac arteries which indicates a likely greater than 50% stenosis.  Past Medical History:  Diagnosis Date  . Anemia of chronic kidney failure   . Anxiety   . CAD (coronary artery disease)   . Carotid artery stenosis   . Chronic kidney disease, stage III (moderate)    Followed by Dr. Juleen China  .  Hyperlipidemia   . Hypertension   . Renal artery stenosis ([place])   . Secondary hyperparathyroidism ([place])   . 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 Subclavian arterial stenosis [facility])     Past Surgical History:  Procedure Laterality Date  . ABDOMINAL HYSTERECTOMY  1976  . CAROTID ARTERY ANGIOPLASTY Left   . CAROTID ENDARTERECTOMY Left   . CHOLECYSTECTOMY    . COLONOSCOPY WITH PROPOFOL N/A 08/16/2017   Procedure: COLONOSCOPY WITH PROPOFOL;  Surgeon: [name], MD;  Location: [facility] ENDOSCOPY;  Service: Endoscopy;  Laterality: N/A;  . CORONARY ANGIOPLASTY WITH STENT PLACEMENT  2000  . CORONARY ARTERY BYPASS GRAFT  2000  . CYSTOSCOPY WITH STENT PLACEMENT Bilateral   . ESOPHAGOGASTRODUODENOSCOPY (EGD) WITH PROPOFOL N/A 08/16/2017   Procedure: ESOPHAGOGASTRODUODENOSCOPY (EGD) WITH PROPOFOL;  Surgeon: [name], MD;  Location: [facility] ENDOSCOPY;  Service: Endoscopy;  Laterality: N/A;  . ESOPHAGOGASTRODUODENOSCOPY (EGD) WITH PROPOFOL N/A 06/29/2018   Procedure: ESOPHAGOGASTRODUODENOSCOPY (EGD) WITH PROPOFOL;  Surgeon: Virgel Manifold, MD;  Location: [facility] ENDOSCOPY;  Service: Endoscopy;  Laterality: N/A;  . JOINT REPLACEMENT Bilateral 2000  . RENAL ARTERY ANGIOPLASTY Bilateral mARCH 2015  . TOE AMPUTATION Right    small toe  . TONSILLECTOMY AND ADENOIDECTOMY    . TOTAL HIP ARTHROPLASTY Left   . TOTAL HIP ARTHROPLASTY Right 2015    Social History   Socioeconomic History  . Marital status: Divorced    Spouse name: Not on file  .  Number of children: Not on file  . Years of education: Not on file  . Highest education level: Not on file  Occupational History  . Occupation: retired  Tobacco Use  . Smoking status: Former Smoker    Quit date: 10/25/1998    Years since quitting: 20.9  . Smokeless tobacco: Never Used  Substance and Sexual Activity  . Alcohol use: No    Alcohol/week: 0.0 standard drinks  . Drug use: Never  . Sexual activity: Not Currently  Other  Topics Concern  . Not on file  Social History Narrative  . Not on file   Social Determinants of Health   Financial Resource Strain:   . Difficulty of Paying Living Expenses: Not on file  Food Insecurity:   . Worried About Charity fundraiser in the Last Year: Not on file  . Ran Out of Food in the Last Year: Not on file  Transportation Needs:   . Lack of Transportation (Medical): Not on file  . Lack of Transportation (Non-Medical): Not on file  Physical Activity:   . Days of Exercise per Week: Not on file  . Minutes of Exercise per Session: Not on file  Stress:   . Feeling of Stress : Not on file  Social Connections:   . Frequency of Communication with Friends and Family: Not on file  . Frequency of Social Gatherings with Friends and Family: Not on file  . Attends Religious Services: Not on file  . Active Member of Clubs or Organizations: Not on file  . Attends Archivist Meetings: Not on file  . Marital Status: Not on file  Intimate Partner Violence:   . Fear of Current or Ex-Partner: Not on file  . Emotionally Abused: Not on file  . Physically Abused: Not on file  . Sexually Abused: Not on file    Family History  Problem Relation Age of Onset  . Stroke Mother   . Hypertension Mother   . Diabetes Mother   . Hypertension Father   . Heart disease Sister        MI  . Multiple sclerosis Daughter   . Multiple sclerosis Son   . Cerebral aneurysm Son   . Seizures Son   . Cerebral aneurysm Son   . Breast cancer Paternal Aunt 87    Allergies  Allergen Reactions  . Citalopram     Throat closing   . Dilaudid [Hydromorphone Hcl] Nausea And Vomiting  . Hydrochlorothiazide Other (See Comments)    Decreased GFR (Nov 2015)  . Nsaids     CKD stage III - avoid nephrotoxic drugs  . Nubain [Nalbuphine Hcl]     Burning sensation in back  . Penicillins Itching  . Prasugrel Itching  . Statins Itching     Review of Systems   Review of Systems: Negative Unless  Checked Constitutional: [] Weight loss  [] Fever  [] Chills Cardiac: [] Chest pain   []  Atrial Fibrillation  [] Palpitations   [] Shortness of breath when laying flat   [] Shortness of breath with exertion. [] Shortness of breath at rest Vascular:  [x] Pain in legs with walking   [] Pain in legs with standing [] Pain in legs when laying flat   [x] Claudication    [] Pain in feet when laying flat    [] History of DVT   [] Phlebitis   [] Swelling in legs   [] Varicose veins   [] Non-healing ulcers Pulmonary:   [] Uses home oxygen   [] Productive cough   [] Hemoptysis   [] Wheeze  [] COPD   []   Asthma Neurologic:  [] Dizziness   [] Seizures  [] Blackouts [] History of stroke   [] History of TIA  [] Aphasia   [] Temporary Blindness   [] Weakness or numbness in arm   [] Weakness or numbness in leg Musculoskeletal:   [] Joint swelling   [] Joint pain   [] Low back pain  []  History of Knee Replacement [] Arthritis [] back Surgeries  []  Spinal Stenosis    Hematologic:  [] Easy bruising  [] Easy bleeding   [] Hypercoagulable state   [x] Anemic Gastrointestinal:  [] Diarrhea   [] Vomiting  [] Gastroesophageal reflux/heartburn   [] Difficulty swallowing. [] Abdominal pain Genitourinary:  [] Chronic kidney disease   [] Difficult urination  [] Anuric   [] Blood in urine [] Frequent urination  [] Burning with urination   [] Hematuria Skin:  [] Rashes   [] Ulcers [] Wounds Psychological:  [x] History of anxiety   []  History of major depression  []  Memory Difficulties      OBJECTIVE:   Physical Exam  BP (!) 151/71 (BP Location: Right Arm)   Pulse 80   Resp 16   Ht 5\' 1"  (1.549 m)   Wt 161 lb (73 kg)   BMI 30.42 kg/m   Gen: WD/WN, NAD Head: [place]/AT, No temporalis wasting.  Ear/Nose/Throat: Hearing grossly intact, nares w/o erythema or drainage Eyes: PER, EOMI, sclera nonicteric.  Neck: Supple, no masses.  No JVD.  Pulmonary:  Good air movement, no use of accessory muscles.  Cardiac: RRR Vascular:  Vessel Right Left  Dorsalis Pedis Not Palpable Not Palpable    Posterior Tibial Not Palpable Not Palpable   Gastrointestinal: soft, non-distended. No guarding/no peritoneal signs.  Musculoskeletal: M/S 5/5 throughout.  No deformity or atrophy.  Neurologic: Pain and light touch intact in extremities.  Symmetrical.  Speech is fluent. Motor exam as listed above. Psychiatric: Judgment intact, Mood & affect appropriate for pt's clinical situation. Dermatologic: No Venous rashes. No Ulcers Noted.  No changes consistent with cellulitis. Lymph : No Cervical lymphadenopathy, no lichenification or skin changes of chronic lymphedema.       ASSESSMENT AND PLAN:  1. Peripheral vascular disease ([place]) I long discussion with the patient today regarding her peripheral arterial disease as a relates to her noninvasive studies as well as her groin pain.  The pain in her groin is highly likely to be related to her peripheral arterial disease.  The patient has an upcoming injection for her back.  If this back injection relieves her pain that it was musculoskeletal in nature.  At this time the patient's peripheral vascular disease does not present with any nonthreatening symptoms.  Therefore per the patient's wishes we will continue with close follow-up.  Instead of having the patient return in 1 year for noninvasive studies we will have her return in 6 months.  However, the patient is instructed to contact her office to return sooner if the leg pain becomes unbearable.  Also, if she develops any wounds on her lower extremities, rest pain like symptoms, sudden severe pain in either lower extremity, loss of motor function, or any other concerning symptoms or lower extremities.  Otherwise we will have the patient return in 6 months for an ABI and an aortoiliac duplex of her bilateral lower extremities.  2. Bilateral carotid artery stenosis Recommend:  Given the patient's asymptomatic subcritical stenosis no further invasive testing or surgery at this time.  Duplex ultrasound  shows known right internal carotid artery occlusion with left internal carotid artery stenosis of 1 to 39%  Continue antiplatelet therapy as prescribed Continue management of CAD, HTN and Hyperlipidemia Healthy heart diet,  encouraged exercise at least 4 times per week Follow up in 12 months with duplex ultrasound and physical exam   3. Abdominal aortic aneurysm (AAA) without rupture [facility]) The patient continues to have an ectatic aorta.  No surgery or intervention at this time. The patient has an asymptomatic abdominal aortic aneurysm that is less than 4 cm in maximal diameter.  I have discussed the natural history of abdominal aortic aneurysm and the small risk of rupture for aneurysm less than 5 cm in size.  However, as these small aneurysms tend to enlarge over time, continued surveillance with ultrasound or CT scan is mandatory.  I have also discussed optimizing medical management with hypertension and lipid control and the importance of abstinence from tobacco.  The patient is also encouraged to exercise a minimum of 30 minutes 4 times a week.  Should the patient develop new onset abdominal or back pain or signs of peripheral embolization they are instructed to seek medical attention immediately and to alert the physician providing care that they have an aneurysm.  The patient voices their understanding. The patient will return in 12 months with an aortic duplex.    Current Outpatient Medications on File Prior to Visit  Medication Sig Dispense Refill  . aspirin 81 MG tablet Take 81 mg by mouth daily.    . chlordiazePOXIDE (LIBRIUM) 25 MG capsule TAKE ONE CAPSULE BY MOUTH ONCE DAILY AS NEEDED FOR ANXIETY 30 capsule 5  . clopidogrel (PLAVIX) 75 MG tablet Take 1 tablet (75 mg total) by mouth daily. 90 tablet 3  . ferrous sulfate (FEROSUL) 325 (65 FE) MG tablet Take 1 tablet (325 mg total) by mouth daily with breakfast. 90 tablet 0  . hydrALAZINE (APRESOLINE) 50 MG tablet TAKE 1 TABLET BY  MOUTH THREE TIMES DAILY AS NEEDED FOR BLOOD PRESSURE GREATER THAN 150 270 tablet 1  . labetalol (NORMODYNE) 200 MG tablet Take 1 tablet (200 mg total) by mouth 2 (two) times daily. 180 tablet 1  . lisinopril (ZESTRIL) 20 MG tablet Take 1 tablet (20 mg total) by mouth 2 (two) times daily. 180 tablet 1  . Omega 3 1000 MG CAPS Take 1 capsule by mouth 2 (two) times daily.     . traMADol (ULTRAM) 50 MG tablet TAKE 2 TABLETS BY MOUTH EVERY 12 HOURS AS NEEDED. MAX OF 4 TABLETS DAILY. 120 tablet 0  . ZETIA 10 MG tablet Take 1 tablet by mouth once daily 30 tablet 0   No current facility-administered medications on file prior to visit.    There are no Patient Instructions on file for this visit. No follow-ups on file.   Kris Hartmann, NP  This note was completed with Sales executive.  Any errors are purely unintentional.

## 2019-10-09 ENCOUNTER — Encounter: Payer: Self-pay | Admitting: Internal Medicine

## 2019-10-09 ENCOUNTER — Ambulatory Visit (INDEPENDENT_AMBULATORY_CARE_PROVIDER_SITE_OTHER): Payer: Medicare Other | Admitting: Internal Medicine

## 2019-10-09 DIAGNOSIS — M79645 Pain in left finger(s): Secondary | ICD-10-CM | POA: Diagnosis not present

## 2019-10-09 MED ORDER — PREDNISONE 10 MG PO TABS: ORAL_TABLET | ORAL | 0 refills | Status: DC

## 2019-10-09 NOTE — Progress Notes (Signed)
Telephone  Note  This visit type was conducted due to national recommendations for restrictions regarding the COVID-19 pandemic (e.g. social distancing).  This format is felt to be most appropriate for this patient at this time.  All issues noted in this document were discussed and addressed.  No physical exam was performed (except for noted visual exam findings with Video Visits).   I connected with@ on 10/09/19 at  1:30 PM EST by  telephone and verified that I am speaking with the correct person using two identifiers. Location patient: home Location provider: work or home office Persons participating in the virtual visit: patient, provider  I discussed the limitations, risks, security and privacy concerns of performing an evaluation and management service by telephone and the availability of in person appointments. I also discussed with the patient that there may be a patient responsible charge related to this service. The patient expressed understanding and agreed to proceed.  Reason for visit: left hand pain   HPI:  78 yr old female with chronic low back  pain and headache secondary to cervical spondylosis and lumbar spinal stenosis presents with new onset left hand pain. The pain is localized at the base of the thumb and is intermittent.  At times the pain radiates to the elbow but not beyond.  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  The pain is variable and is accompanied by loss of strength which is also variable.  She notes numbness and tingling in the hand which wakes her up  At night and involves all of the fingers of the hand.   She has  No history of fall  , elbow  Trauma or hand trauma . No history of trauma or CTS     ROS: See pertinent positives and negatives per HPI.  Past Medical History:  Diagnosis Date  . Anemia of chronic kidney failure   . Anxiety   . CAD (coronary artery disease)   . Carotid artery stenosis   . Chronic kidney  disease, stage III (moderate)    Followed by Dr. Juleen China  . Hyperlipidemia   . Hypertension   . Renal artery stenosis ([place])   . Secondary hyperparathyroidism ([place])   . 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 Subclavian arterial stenosis [facility])     Past Surgical History:  Procedure Laterality Date  . ABDOMINAL HYSTERECTOMY  1976  . CAROTID ARTERY ANGIOPLASTY Left   . CAROTID ENDARTERECTOMY Left   . CHOLECYSTECTOMY    . COLONOSCOPY WITH PROPOFOL N/A 08/16/2017   Procedure: COLONOSCOPY WITH PROPOFOL;  Surgeon: [name], MD;  Location: [facility] ENDOSCOPY;  Service: Endoscopy;  Laterality: N/A;  . CORONARY ANGIOPLASTY WITH STENT PLACEMENT  2000  . CORONARY ARTERY BYPASS GRAFT  2000  . CYSTOSCOPY WITH STENT PLACEMENT Bilateral   . ESOPHAGOGASTRODUODENOSCOPY (EGD) WITH PROPOFOL N/A 08/16/2017   Procedure: ESOPHAGOGASTRODUODENOSCOPY (EGD) WITH PROPOFOL;  Surgeon: [name], MD;  Location: [facility] ENDOSCOPY;  Service: Endoscopy;  Laterality: N/A;  . ESOPHAGOGASTRODUODENOSCOPY (EGD) WITH PROPOFOL N/A 06/29/2018   Procedure: ESOPHAGOGASTRODUODENOSCOPY (EGD) WITH PROPOFOL;  Surgeon: Virgel Manifold, MD;  Location: [facility] ENDOSCOPY;  Service: Endoscopy;  Laterality: N/A;  . JOINT REPLACEMENT Bilateral 2000  . RENAL ARTERY ANGIOPLASTY Bilateral mARCH 2015  . TOE AMPUTATION Right    small toe  . TONSILLECTOMY AND ADENOIDECTOMY    . TOTAL HIP ARTHROPLASTY Left   . TOTAL HIP ARTHROPLASTY Right 2015    Family History  Problem Relation  Age of Onset  . Stroke Mother   . Hypertension Mother   . Diabetes Mother   . Hypertension Father   . Heart disease Sister        MI  . Multiple sclerosis Daughter   . Multiple sclerosis Son   . Cerebral aneurysm Son   . Seizures Son   . Cerebral aneurysm Son   . Breast cancer Paternal Aunt 38    SOCIAL HX:  reports that she quit smoking about 20 years ago. She has never used smokeless tobacco. She reports that she does not drink alcohol  or use drugs.   Current Outpatient Medications:  .  aspirin 81 MG tablet, Take 81 mg by mouth daily., Disp: , Rfl:  .  chlordiazePOXIDE (LIBRIUM) 25 MG capsule, TAKE ONE CAPSULE BY MOUTH ONCE DAILY AS NEEDED FOR ANXIETY, Disp: 30 capsule, Rfl: 5 .  clopidogrel (PLAVIX) 75 MG tablet, Take 1 tablet (75 mg total) by mouth daily., Disp: 90 tablet, Rfl: 3 .  fentaNYL (DURAGESIC) 50 MCG/HR, Place 1 patch onto the skin every 3 (three) days., Disp: 30 patch, Rfl: 0 .  ferrous sulfate (FEROSUL) 325 (65 FE) MG tablet, Take 1 tablet (325 mg total) by mouth daily with breakfast., Disp: 90 tablet, Rfl: 0 .  hydrALAZINE (APRESOLINE) 50 MG tablet, TAKE 1 TABLET BY MOUTH THREE TIMES DAILY AS NEEDED FOR BLOOD PRESSURE GREATER THAN 150, Disp: 270 tablet, Rfl: 1 .  labetalol (NORMODYNE) 200 MG tablet, Take 1 tablet (200 mg total) by mouth 2 (two) times daily., Disp: 180 tablet, Rfl: 1 .  lisinopril (ZESTRIL) 20 MG tablet, Take 1 tablet (20 mg total) by mouth 2 (two) times daily., Disp: 180 tablet, Rfl: 1 .  nortriptyline (PAMELOR) 10 MG capsule, Take 1 capsule (10 mg total) by mouth at bedtime., Disp: 90 capsule, Rfl: 1 .  Omega 3 1000 MG CAPS, Take 1 capsule by mouth 2 (two) times daily. , Disp: , Rfl:  .  traMADol (ULTRAM) 50 MG tablet, TAKE 2 TABLETS BY MOUTH EVERY 12 HOURS AS NEEDED. MAX OF 4 TABLETS DAILY., Disp: 120 tablet, Rfl: 0 .  ZETIA 10 MG tablet, Take 1 tablet by mouth once daily, Disp: 30 tablet, Rfl: 0 .  predniSONE (DELTASONE) 10 MG tablet, 6 tablets on Day 1 , then reduce by 1 tablet daily until gone, Disp: 21 tablet, Rfl: 0  EXAM:  VITALS per patient if applicable:  GENERAL: alert, oriented, appears well and in no acute distress  HEENT: atraumatic, conjunttiva clear, no obvious abnormalities on inspection of external nose and ears  NECK: normal movements of the head and neck  LUNGS: on inspection no signs of respiratory distress, breathing rate appears normal, no obvious gross SOB, gasping  or wheezing  CV: no obvious cyanosis  MS: moves all visible extremities without noticeable abnormality  PSYCH/NEURO: pleasant and cooperative, no obvious depression or anxiety, speech and thought processing grossly intact  ASSESSMENT AND PLAN:  Discussed the following assessment and plan:  Thumb pain, left  Thumb pain, left Subacute,  For the last 2 months . Variability suggests mechanical and likely due to OA aggravated by activities. .  Trial of prednisone taper,  If no improvement, will refer to [facility] for appt with hand surgeon to rule out CTS    I discussed the assessment and treatment plan with the patient. The patient was provided an opportunity to ask questions and all were answered. The patient agreed with the plan and demonstrated an understanding  of the instructions.   The patient was advised to call back or seek an in-person evaluation if the symptoms worsen or if the condition fails to improve as anticipated.  I provided  22 minutes of non-face-to-face time during this encounter.   Crecencio Mc, MD

## 2019-10-09 NOTE — Assessment & Plan Note (Addendum)
Subacute,  For the last 2 months . Variability suggests mechanical and likely due to OA aggravated by activities. .  Trial of prednisone taper,  If no improvement, will refer to [facility] for appt with hand surgeon to rule out CTS

## 2019-11-04 ENCOUNTER — Other Ambulatory Visit: Payer: Self-pay | Admitting: Internal Medicine

## 2019-11-07 ENCOUNTER — Telehealth: Payer: Self-pay | Admitting: Internal Medicine

## 2019-11-07 ENCOUNTER — Other Ambulatory Visit: Payer: Self-pay | Admitting: Internal Medicine

## 2019-11-07 DIAGNOSIS — I10 Essential (primary) hypertension: Secondary | ICD-10-CM

## 2019-11-07 MED ORDER — ZETIA 10 MG PO TABS: 10.0000 mg | ORAL_TABLET | Freq: Every day | ORAL | 1 refills | Status: DC

## 2019-11-07 MED ORDER — NORTRIPTYLINE HCL 10 MG PO CAPS: 10.0000 mg | ORAL_CAPSULE | Freq: Two times a day (BID) | ORAL | 1 refills | Status: DC

## 2019-11-07 NOTE — Telephone Encounter (Signed)
The original message was incorrect. When I called th ept back she stated that it is not the labetalol that she was asking about. The pt is wanting to know if she can go back to taking the amitriptyline 1 in the morning and 1 at bedtime. She stated she used to take it this way and then it was decreased to once a day but with the twice daily she doesn't have any stomach pain. Can pt go back to taking twice daily?

## 2019-11-07 NOTE — Telephone Encounter (Signed)
Yes ok to do,  That's what her prescription reads, AND she has a refill left

## 2019-11-07 NOTE — Telephone Encounter (Signed)
Is it okay for pt to increase the labetalol back to 1 tablet in the morning and 1 in the evening?

## 2019-11-07 NOTE — Telephone Encounter (Signed)
Yes  rx has been changed to bid dosing and sent to [facility]

## 2019-11-07 NOTE — Telephone Encounter (Signed)
Pt called and wanted Dr. Derrel Nip know that she was taking 1 of  labetalol (NORMODYNE) 200 MG tablet, because 2 was hurting her stomach. She wants to now go back to 1 in the  Morning and 1 at night. She also needs a refill on them.

## 2019-11-07 NOTE — Telephone Encounter (Signed)
Spoke with pt to let her know that it is okay for her to increase back to twice daily and that a new prescription has been sent in to [facility]. Pt gave a verbal understanding.

## 2019-11-07 NOTE — Telephone Encounter (Signed)
Refilled: 10/09/2019 Last OV: 10/09/2019 Next OV: not scheduled

## 2019-11-14 DIAGNOSIS — M5416 Radiculopathy, lumbar region: Secondary | ICD-10-CM | POA: Diagnosis not present

## 2019-11-14 DIAGNOSIS — M48062 Spinal stenosis, lumbar region with neurogenic claudication: Secondary | ICD-10-CM | POA: Diagnosis not present

## 2019-11-14 DIAGNOSIS — M5136 Other intervertebral disc degeneration, lumbar region: Secondary | ICD-10-CM | POA: Diagnosis not present

## 2019-11-16 NOTE — Telephone Encounter (Signed)
Patient dropped off [facility] letter for [place]. Letter is up front in color folder.

## 2019-11-19 ENCOUNTER — Telehealth: Payer: Self-pay | Admitting: Internal Medicine

## 2019-11-19 NOTE — Telephone Encounter (Signed)
Sent a mychart message letting pt know that letter has been placed up front for pick up.

## 2019-11-19 NOTE — Telephone Encounter (Signed)
The appeals letter for Sabrina [name] has been written and printed.  I have the letter from [facility] for [place]

## 2019-11-20 ENCOUNTER — Other Ambulatory Visit: Payer: Self-pay | Admitting: Internal Medicine

## 2019-11-20 MED ORDER — FENTANYL 50 MCG/HR TD PT72: 1.0000 | MEDICATED_PATCH | TRANSDERMAL | 0 refills | Status: DC

## 2019-11-20 NOTE — Telephone Encounter (Signed)
Refilled: 10/05/2019 Last OV: 10/09/2019 Next OV: not scheduled

## 2019-11-20 NOTE — Telephone Encounter (Signed)
Pt called about being on the last patch for fentaNYL (DURAGESIC) 50 MCG/HR needs a refill. Please advise and Thank you!  Pharmacy is [facility] ([facility]), [place] [address]  Call pt @ [phone]

## 2019-11-20 NOTE — Telephone Encounter (Signed)
Letter has been faxed.

## 2019-11-20 NOTE — Telephone Encounter (Signed)
Pt called to follow up on letter. Letter will be faxed to [facility].

## 2019-11-20 NOTE — Telephone Encounter (Signed)
Pt called and stated that she doesn't usually pick up the letter and that it is usually sent to [facility]. Please call pt back to clarify what needs to be done. Thanks!

## 2019-12-12 DIAGNOSIS — M25551 Pain in right hip: Secondary | ICD-10-CM | POA: Diagnosis not present

## 2019-12-12 DIAGNOSIS — G8929 Other chronic pain: Secondary | ICD-10-CM | POA: Diagnosis not present

## 2019-12-12 DIAGNOSIS — M25552 Pain in left hip: Secondary | ICD-10-CM | POA: Diagnosis not present

## 2019-12-12 DIAGNOSIS — M5442 Lumbago with sciatica, left side: Secondary | ICD-10-CM | POA: Diagnosis not present

## 2019-12-12 DIAGNOSIS — M48062 Spinal stenosis, lumbar region with neurogenic claudication: Secondary | ICD-10-CM | POA: Diagnosis not present

## 2019-12-12 DIAGNOSIS — M5416 Radiculopathy, lumbar region: Secondary | ICD-10-CM | POA: Diagnosis not present

## 2019-12-12 DIAGNOSIS — M5441 Lumbago with sciatica, right side: Secondary | ICD-10-CM | POA: Diagnosis not present

## 2019-12-12 DIAGNOSIS — M5136 Other intervertebral disc degeneration, lumbar region: Secondary | ICD-10-CM | POA: Diagnosis not present

## 2019-12-18 ENCOUNTER — Telehealth: Payer: Self-pay | Admitting: Internal Medicine

## 2019-12-18 NOTE — Telephone Encounter (Signed)
Patient stated 5 months ago she had an episode of BP elevated and she had EMS come take her from church to [facility]. Yesterday she ate some things that had more than usual salt. Patient did not experience SOB, arm pain, jaw pain, chest pain. Patient did experience the sensation of having her HR elevate. Today she feels fine just at time she feels some palpitations. She would like to get an appointment with Dr Clayborn Bigness @ [facility], may need referral. Patient wanted to inform Dr Derrel Nip of what happened. She stated she only wants to see Dr Derrel Nip no ED/UC, she wants a call back. But if sx start up I instructed her to see ED. She reluctantly agreed. Please advise.

## 2019-12-18 NOTE — Telephone Encounter (Signed)
Pt called and stated that her BP yesterday afternoon was 202/112 and was light headed last night it was 204/120 And today @ 3.45 it was 133/69

## 2019-12-19 ENCOUNTER — Encounter: Payer: Self-pay | Admitting: Internal Medicine

## 2019-12-19 NOTE — Telephone Encounter (Signed)
This is not an acute issue based on message from [place],  so itif that is the case I see no reason to double book somewhere this week.  monday at 9:30 is fine

## 2019-12-19 NOTE — Telephone Encounter (Signed)
Spoke with pt and she has been scheduled for Monday at 9:30am.

## 2019-12-19 NOTE — Telephone Encounter (Signed)
Is there somewhere this week that you would for me to work her in or would Monday at 9:30am be okay?

## 2019-12-24 ENCOUNTER — Telehealth: Payer: Self-pay | Admitting: Internal Medicine

## 2019-12-24 ENCOUNTER — Ambulatory Visit: Payer: Medicare Other | Admitting: Internal Medicine

## 2019-12-24 NOTE — Telephone Encounter (Signed)
Pt states that she thought her appt was on 12/26/19 and there was not any appts available until 3/24. Pt is concerned that her blood pressure has been to high 203/120. Pt would like a call back

## 2019-12-24 NOTE — Telephone Encounter (Signed)
Pt was scheduled for an appt this morning but states that she thought her appt was for 12/26/2019. Pt is concerned about her bp being elevated. No available appts this week or next week. Is there somewhere we can work her in?

## 2019-12-24 NOTE — Telephone Encounter (Signed)
You can reschedule ms Speece for 1:00 on Friday march 12   And she should be charged for today's missed visit

## 2019-12-26 NOTE — Telephone Encounter (Signed)
Pt has been scheduled for Monday the 8th at 11:30am. Pt is aware of appt date and time.

## 2019-12-27 ENCOUNTER — Other Ambulatory Visit: Payer: Self-pay

## 2019-12-27 ENCOUNTER — Telehealth: Payer: Self-pay

## 2019-12-27 MED ORDER — FENTANYL 50 MCG/HR TD PT72: 1.0000 | MEDICATED_PATCH | TRANSDERMAL | 0 refills | Status: DC

## 2019-12-27 MED ORDER — ZETIA 10 MG PO TABS: 10.0000 mg | ORAL_TABLET | Freq: Every day | ORAL | 1 refills | Status: DC

## 2019-12-27 NOTE — Telephone Encounter (Signed)
Pharmacist at [facility] called and stated that they can not fill pt's fentanyl the way it is written. Pharmacist stated that the insurance will only pay for 30 days worth so they need a new rx written and sent in for 30 patches per month.

## 2019-12-27 NOTE — Telephone Encounter (Signed)
rx corrected 

## 2019-12-31 ENCOUNTER — Other Ambulatory Visit: Payer: Self-pay

## 2019-12-31 ENCOUNTER — Ambulatory Visit (INDEPENDENT_AMBULATORY_CARE_PROVIDER_SITE_OTHER): Payer: Medicare Other | Admitting: Internal Medicine

## 2019-12-31 ENCOUNTER — Encounter: Payer: Self-pay | Admitting: Internal Medicine

## 2019-12-31 DIAGNOSIS — M25551 Pain in right hip: Secondary | ICD-10-CM | POA: Diagnosis not present

## 2019-12-31 DIAGNOSIS — M48062 Spinal stenosis, lumbar region with neurogenic claudication: Secondary | ICD-10-CM

## 2019-12-31 DIAGNOSIS — M25552 Pain in left hip: Secondary | ICD-10-CM

## 2019-12-31 DIAGNOSIS — F1190 Opioid use, unspecified, uncomplicated: Secondary | ICD-10-CM | POA: Diagnosis not present

## 2019-12-31 DIAGNOSIS — M47812 Spondylosis without myelopathy or radiculopathy, cervical region: Secondary | ICD-10-CM | POA: Diagnosis not present

## 2019-12-31 MED ORDER — ZETIA 10 MG PO TABS: 10.0000 mg | ORAL_TABLET | Freq: Every day | ORAL | 1 refills | Status: DC

## 2019-12-31 MED ORDER — PANTOPRAZOLE SODIUM 40 MG PO TBEC: 40.0000 mg | DELAYED_RELEASE_TABLET | Freq: Every day | ORAL | 3 refills | Status: DC

## 2019-12-31 NOTE — Patient Instructions (Addendum)
For your back pain :  Please add 2000 mg of acetominophen (tylenol) every day safely  In divided doses (500 mg every 6 hours  Or 1000 mg every 12 hours.)   Reduce your iron to once daily; take  with food or OJ  If your stomach bothers you,  You can resume pantoprazole (Protonix)

## 2019-12-31 NOTE — Progress Notes (Signed)
Subjective:  Patient ID: Sabrina [name], female    DOB: Apr 19, 1941  Age: 79 y.o. MRN: [id number]  CC: Diagnoses of Hip pain, bilateral, Chronic narcotic use, Cervical spondylosis without myelopathy, and Lumbar stenosis with neurogenic claudication were pertinent to this visit.  HPI Sabrina [name] presents for follow up on chronic back pain, chronic headaches, hyperlipidemia  And labile hypertension.     This visit occurred during the SARS-CoV-2 public health emergency.  Safety protocols were in place, including screening questions prior to the visit, additional usage of staff PPE, and extensive cleaning of exam room while observing appropriate contact time as indicated for disinfecting solutions.    She has had recent elevation to 242 systolic,  Improved now .  Dietary indiscretions with excess salt blamed (eating [employer] its and bologna) Taking labetalol, hydralazine and lisinopril   Only using aleve on average once a month for headache.  Pamelor helping, but patient very confused about her medications .  Does not appear to be taking a PPI and initially stated that the pamelor was not helping her gastritis   Seeing [facility] for back pain   "pain is so severe you woudn't believe it "  Has had 2 ESI thus far,  One on each side of lumbar spine with diminishing returns ,   3rd planned for march 23 .  She is limited to walking distances f < 100 years , has to rest,  Has to rest every 10 minutes at home during housework because of her pain.  Using fentanyl patches and prn tramadol.  frustrated at her inability to live without back pain   Reviewed office note from NP in [facility] office:  Options limited due to patient's lack of interest in PT and surgery     Outpatient Medications Prior to Visit  Medication Sig Dispense Refill  . aspirin 81 MG tablet Take 81 mg by mouth daily.    . chlordiazePOXIDE (LIBRIUM) 25 MG capsule TAKE ONE CAPSULE BY MOUTH ONCE DAILY AS NEEDED FOR ANXIETY 30 capsule  5  . clopidogrel (PLAVIX) 75 MG tablet Take 1 tablet (75 mg total) by mouth daily. 90 tablet 3  . fentaNYL (DURAGESIC) 50 MCG/HR Place 1 patch onto the skin every 3 (three) days. 10 patch 0  . ferrous sulfate (FEROSUL) 325 (65 FE) MG tablet Take 1 tablet (325 mg total) by mouth daily with breakfast. 90 tablet 0  . hydrALAZINE (APRESOLINE) 50 MG tablet TAKE 1 TABLET BY MOUTH THREE TIMES DAILY AS NEEDED FOR BLOOD PRESSURE GREATER THAN 150 270 tablet 1  . labetalol (NORMODYNE) 200 MG tablet Take 1 tablet (200 mg total) by mouth 2 (two) times daily. 180 tablet 1  . lisinopril (ZESTRIL) 20 MG tablet Take 1 tablet (20 mg total) by mouth 2 (two) times daily. 180 tablet 1  . NARCAN 4 MG/0.1ML LIQD nasal spray kit     . nortriptyline (PAMELOR) 10 MG capsule Take 1 capsule (10 mg total) by mouth 2 (two) times daily. 180 capsule 1  . Omega 3 1000 MG CAPS Take 1 capsule by mouth 2 (two) times daily.     . traMADol (ULTRAM) 50 MG tablet TAKE 2 TABLETS BY MOUTH EVERY 12 HOURS AS NEEDED. MAX OF 4 TABLETS DAILY. 120 tablet 0  . ZETIA 10 MG tablet Take 1 tablet (10 mg total) by mouth daily. 90 tablet 1  . predniSONE (DELTASONE) 10 MG tablet 6 tablets on Day 1 , then reduce by 1 tablet daily until  gone 21 tablet 0   No facility-administered medications prior to visit.    Review of Systems;  Patient denies headache, fevers, malaise, unintentional weight loss, skin rash, eye pain, sinus congestion and sinus pain, sore throat, dysphagia,  hemoptysis , cough, dyspnea, wheezing, chest pain, palpitations, orthopnea, edema, abdominal pain, nausea, melena, diarrhea, constipation, flank pain, dysuria, hematuria, urinary  Frequency, nocturia, numbness, tingling, seizures,  Focal weakness, Loss of consciousness,  Tremor, insomnia, depression, anxiety, and suicidal ideation.      Objective:  BP (!) 180/84 (BP Location: Right Arm, Patient Position: Sitting, Cuff Size: Normal)   Pulse 65   Temp (!) 97.5 F (36.4 C)  (Temporal)   Resp 16   Ht '5\' 1"'$  (1.549 m)   Wt 162 lb (73.5 kg)   SpO2 99%   BMI 30.61 kg/m   BP Readings from Last 3 Encounters:  12/31/19 (!) 180/84  10/03/19 (!) 151/71  09/14/19 (!) 132/56    Wt Readings from Last 3 Encounters:  12/31/19 162 lb (73.5 kg)  10/09/19 161 lb (73 kg)  10/03/19 161 lb (73 kg)    General appearance: alert, cooperative and appears stated age Ears: normal TM's and external ear canals both ears Throat: lips, mucosa, and tongue normal; teeth and gums normal Neck: no adenopathy, no carotid bruit, supple, symmetrical, trachea midline and thyroid not enlarged, symmetric, no tenderness/mass/nodules Back: symmetric, no curvature. ROM normal. No CVA tenderness. Lungs: clear to auscultation bilaterally Heart: regular rate and rhythm, S1, S2 normal, no murmur, click, rub or gallop Abdomen: soft, non-tender; bowel sounds normal; no masses,  no organomegaly Pulses: 2+ and symmetric Skin: Skin color, texture, turgor normal. No rashes or lesions Lymph nodes: Cervical, supraclavicular, and axillary nodes normal.  Lab Results  Component Value Date   HGBA1C 5.9 07/19/2016   HGBA1C 5.8 06/02/2015    Lab Results  Component Value Date   CREATININE 1.04 (H) 09/14/2019   CREATININE 1.32 (H) 05/23/2019   CREATININE 1.22 (H) 12/21/2018    Lab Results  Component Value Date   WBC 5.5 09/14/2019   HGB 11.0 (L) 09/14/2019   HCT 33.9 (L) 09/14/2019   PLT 202 09/14/2019   GLUCOSE 122 (H) 09/14/2019   CHOL 167 02/22/2018   TRIG 53.0 02/22/2018   HDL 54.80 02/22/2018   LDLDIRECT 109.0 03/05/2016   LDLCALC 102 (H) 02/22/2018   ALT 17 09/14/2019   AST 20 09/14/2019   NA 135 09/14/2019   K 4.0 09/14/2019   CL 102 09/14/2019   CREATININE 1.04 (H) 09/14/2019   BUN 17 09/14/2019   CO2 26 09/14/2019   TSH 4.46 03/06/2018   INR 1.0 03/27/2014   HGBA1C 5.9 07/19/2016    MR LUMBAR SPINE WO CONTRAST  Result Date: 07/20/2019 CLINICAL DATA:  Chronic low back  pain with left-sided sciatica, unspecified back pain laterality. Additional history provided: Pain across lower back left greater than right since 2000, left greater than right hip and buttock pain. EXAM: MRI LUMBAR SPINE WITHOUT CONTRAST TECHNIQUE: Multiplanar, multisequence MR imaging of the lumbar spine was performed. No intravenous contrast was administered. COMPARISON:  Lumbar spine MRI 05/05/2016 FINDINGS: Segmentation: 5 lumbar vertebrae. Alignment: Lumbar dextrocurvature. Trace L2-L3 retrolisthesis. 5 mm L4-L5 grade 1 anterolisthesis, unchanged. Vertebrae: Vertebral body height is maintained. No suspicious osseous lesions or marrow edema. Conus medullaris and cauda equina: Conus extends to the L1-L2 level. No signal abnormality within the visualized distal spinal cord. Paraspinal and other soft tissues: Tiny T2 hyperintense lesions within the bilateral kidneys  likely reflects cysts. Included portions of the abdomen otherwise unremarkable. Atrophy of the lumbar paraspinal musculature. Disc levels: Mild multilevel disc degeneration. Unless otherwise stated, the level by level findings below have not significantly changed since prior MRI 05/05/2016. T12-L1: Mild facet arthrosis. No significant canal or foraminal stenosis. L1-L2: Mild facet arthrosis. No significant canal or foraminal stenosis. L2-L3: Trace retrolisthesis. Small disc bulge. Mild facet arthrosis. No significant spinal canal or neural foraminal narrowing. L3-L4: Disc bulge asymmetric to the left. Facet arthrosis/ligamentum flavum hypertrophy. Mild bilateral subarticular narrowing (greater on the left) with possible contact upon the descending left L4 nerve root. No significant central canal stenosis. Mild left neural foraminal narrowing. Left subarticular narrowing may be slightly progressed with findings otherwise unchanged. L4-L5: Grade 1 anterolisthesis with disc uncovering. Prominent facet arthrosis/ligamentum flavum hypertrophy (greater on  the left). Bilateral subarticular narrowing (greater on the left) with likely impingement upon the descending left L5 nerve root. Mild central canal stenosis. Mild bilateral neural foraminal narrowing. L5-S1: Small disc bulge with endplate spurring. Mild facet arthrosis. Mild bilateral subarticular narrowing with possible contact upon the descending S1 nerve roots. No significant central canal stenosis. Mild bilateral neural foraminal narrowing IMPRESSION: Lumbar spondylosis as detailed. Multifactorial mild L3-L4 left subarticular narrowing with possible contact upon the descending left L4 nerve root may be slightly progressed. Findings otherwise similar to prior MRI 05/05/2016. At L4-L5, degenerative grade 1 anterolisthesis with prominent facet arthrosis/ligamentum flavum hypertrophy. Bilateral subarticular narrowing (greater on the left) with probable impingement of the descending left L5 nerve root. Mild relative central canal stenosis and bilateral neural foraminal narrowing. No more than mild spinal canal stenosis at the remaining levels. Additional sites of mild neural foraminal narrowing as described. Electronically Signed   By: Kellie Simmering   On: 07/20/2019 10:22    Assessment & Plan:   Problem List Items Addressed This Visit      Unprioritized   Hip pain, bilateral    Her pain is not well controlled on current regimen of fentanyl patch 50 mcg and tramadol twice daily.  Advised to  add tylenol 2000 mg daily       Chronic narcotic use    .Marland KitchenShe has not had any ER visits  And has not requested any early refills.  Her Refill history was confirmed via [place] Controlled Substance database by me today during her visit and there have been no prescriptions of controlled substances filled from any providers other than me.  She was given an rx for Narcan from NP and was offended by the prescription .  Advised her that this was standard of care       Cervical spondylosis without myelopathy    Continue  fentanyl, tramadol and tylenol.        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  Continue fentanyl transdermal and oral tramadol  for pain management ,  She is established with [facility] for 3rd ESI in process.  Adding tylenol 2000 mg daily.  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 Teresi's predniSONE. I am also having her start on pantoprazole. Additionally, I am having her maintain her aspirin, Omega 3, hydrALAZINE, clopidogrel, ferrous sulfate, chlordiazePOXIDE, labetalol, lisinopril, traMADol, nortriptyline, fentaNYL, Narcan, and Zetia.  Meds ordered this encounter  Medications  . pantoprazole (PROTONIX) 40 MG tablet    Sig: Take  1 tablet (40 mg total) by mouth daily. On an empty stomach  For gastritis    Dispense:  30 tablet    Refill:  3  . DISCONTD: ZETIA 10 MG tablet    Sig: Take 1 tablet (10 mg total) by mouth daily.    Dispense:  90 tablet    Refill:  1  . ZETIA 10 MG tablet    Sig: Take 1 tablet (10 mg total) by mouth daily.    Dispense:  90 tablet    Refill:  1    Medications Discontinued During This Encounter  Medication Reason  . predniSONE (DELTASONE) 10 MG tablet Completed Course  . ZETIA 10 MG tablet Reorder  . ZETIA 10 MG tablet Reorder   A total of 40 minutes was spent with patient more than half of which was spent in counseling patient on the above mentioned issues , reviewing and explaining recent labs and imaging studies done, and coordination of care.  Follow-up: No follow-ups on file.   Crecencio Mc, MD

## 2020-01-01 NOTE — Assessment & Plan Note (Signed)
Her pain is not well controlled on current regimen of fentanyl patch 50 mcg and tramadol twice daily.  Advised to  add tylenol 2000 mg daily

## 2020-01-01 NOTE — Assessment & Plan Note (Signed)
Continue fentanyl, tramadol and tylenol.

## 2020-01-01 NOTE — Assessment & Plan Note (Signed)
..  She has not had any ER visits  And has not requested any early refills.  Her Refill history was confirmed via  Controlled Substance database by me today during her visit and there have been no prescriptions of controlled substances filled from any providers other than me.  She was given an rx for Narcan from NP and was offended by the prescription .  Advised her that this was standard of care

## 2020-01-01 NOTE — Assessment & Plan Note (Signed)
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  Continue fentanyl transdermal and oral tramadol  for pain management ,  She is established with [facility] for 3rd ESI in process.  Adding tylenol 2000 mg daily.  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

## 2020-01-10 ENCOUNTER — Telehealth: Payer: Self-pay | Admitting: Internal Medicine

## 2020-01-10 NOTE — Telephone Encounter (Signed)
Pt called wanting to know if she needed to keep her appt with Dr. Derrel Nip on 3/24 since she was seen on 3/8 Please call pt back also can lm letting her know

## 2020-01-11 NOTE — Telephone Encounter (Signed)
No,  She can follow up in 3 months per usual

## 2020-01-11 NOTE — Telephone Encounter (Signed)
Does pt need to keep her appt on 01/16/2020 if she is not having any symptoms of any kind or issues with her bp?

## 2020-01-11 NOTE — Telephone Encounter (Signed)
Spoke with pt and she stated that she is not having any symptoms so appt has been canceled.

## 2020-01-14 ENCOUNTER — Other Ambulatory Visit: Payer: Self-pay | Admitting: Internal Medicine

## 2020-01-14 MED ORDER — LISINOPRIL 20 MG PO TABS: 20.0000 mg | ORAL_TABLET | Freq: Two times a day (BID) | ORAL | 1 refills | Status: DC

## 2020-01-16 ENCOUNTER — Ambulatory Visit: Payer: Medicare Other | Admitting: Internal Medicine

## 2020-01-17 DIAGNOSIS — M5416 Radiculopathy, lumbar region: Secondary | ICD-10-CM | POA: Diagnosis not present

## 2020-01-17 DIAGNOSIS — M5136 Other intervertebral disc degeneration, lumbar region: Secondary | ICD-10-CM | POA: Diagnosis not present

## 2020-01-17 DIAGNOSIS — M48062 Spinal stenosis, lumbar region with neurogenic claudication: Secondary | ICD-10-CM | POA: Diagnosis not present

## 2020-02-07 DIAGNOSIS — M48062 Spinal stenosis, lumbar region with neurogenic claudication: Secondary | ICD-10-CM | POA: Diagnosis not present

## 2020-02-07 DIAGNOSIS — M5441 Lumbago with sciatica, right side: Secondary | ICD-10-CM | POA: Diagnosis not present

## 2020-02-07 DIAGNOSIS — M25552 Pain in left hip: Secondary | ICD-10-CM | POA: Diagnosis not present

## 2020-02-07 DIAGNOSIS — M25551 Pain in right hip: Secondary | ICD-10-CM | POA: Diagnosis not present

## 2020-02-07 DIAGNOSIS — M5442 Lumbago with sciatica, left side: Secondary | ICD-10-CM | POA: Diagnosis not present

## 2020-02-07 DIAGNOSIS — G8929 Other chronic pain: Secondary | ICD-10-CM | POA: Diagnosis not present

## 2020-02-07 DIAGNOSIS — M5136 Other intervertebral disc degeneration, lumbar region: Secondary | ICD-10-CM | POA: Diagnosis not present

## 2020-02-07 DIAGNOSIS — M5416 Radiculopathy, lumbar region: Secondary | ICD-10-CM | POA: Diagnosis not present

## 2020-02-07 MED ORDER — FENTANYL 50 MCG/HR TD PT72: 1.0000 | MEDICATED_PATCH | TRANSDERMAL | 0 refills | Status: DC

## 2020-02-11 DIAGNOSIS — E785 Hyperlipidemia, unspecified: Secondary | ICD-10-CM | POA: Diagnosis not present

## 2020-02-11 DIAGNOSIS — R42 Dizziness and giddiness: Secondary | ICD-10-CM | POA: Diagnosis not present

## 2020-02-11 DIAGNOSIS — K219 Gastro-esophageal reflux disease without esophagitis: Secondary | ICD-10-CM | POA: Diagnosis not present

## 2020-02-11 DIAGNOSIS — I208 Other forms of angina pectoris: Secondary | ICD-10-CM | POA: Diagnosis not present

## 2020-02-11 DIAGNOSIS — R011 Cardiac murmur, unspecified: Secondary | ICD-10-CM | POA: Diagnosis not present

## 2020-02-11 DIAGNOSIS — R0602 Shortness of breath: Secondary | ICD-10-CM | POA: Diagnosis not present

## 2020-02-11 DIAGNOSIS — I25810 Atherosclerosis of coronary artery bypass graft(s) without angina pectoris: Secondary | ICD-10-CM | POA: Diagnosis not present

## 2020-02-11 DIAGNOSIS — I359 Nonrheumatic aortic valve disorder, unspecified: Secondary | ICD-10-CM | POA: Diagnosis not present

## 2020-02-11 DIAGNOSIS — I10 Essential (primary) hypertension: Secondary | ICD-10-CM | POA: Diagnosis not present

## 2020-02-11 DIAGNOSIS — I25118 Atherosclerotic heart disease of native coronary artery with other forms of angina pectoris: Secondary | ICD-10-CM | POA: Diagnosis not present

## 2020-03-10 ENCOUNTER — Other Ambulatory Visit: Payer: Self-pay

## 2020-03-10 ENCOUNTER — Inpatient Hospital Stay: Payer: Medicare Other | Attending: Oncology

## 2020-03-10 ENCOUNTER — Telehealth: Payer: Self-pay | Admitting: Internal Medicine

## 2020-03-10 ENCOUNTER — Encounter: Payer: Self-pay | Admitting: Oncology

## 2020-03-10 ENCOUNTER — Inpatient Hospital Stay (HOSPITAL_BASED_OUTPATIENT_CLINIC_OR_DEPARTMENT_OTHER): Payer: Medicare Other | Admitting: Oncology

## 2020-03-10 ENCOUNTER — Other Ambulatory Visit: Payer: Self-pay | Admitting: Internal Medicine

## 2020-03-10 VITALS — BP 185/73 | HR 69 | Temp 95.7°F | Resp 18 | Wt 161.4 lb

## 2020-03-10 DIAGNOSIS — Z886 Allergy status to analgesic agent status: Secondary | ICD-10-CM | POA: Insufficient documentation

## 2020-03-10 DIAGNOSIS — Z885 Allergy status to narcotic agent status: Secondary | ICD-10-CM | POA: Insufficient documentation

## 2020-03-10 DIAGNOSIS — Z8249 Family history of ischemic heart disease and other diseases of the circulatory system: Secondary | ICD-10-CM | POA: Insufficient documentation

## 2020-03-10 DIAGNOSIS — N1831 Chronic kidney disease, stage 3a: Secondary | ICD-10-CM | POA: Diagnosis not present

## 2020-03-10 DIAGNOSIS — G894 Chronic pain syndrome: Secondary | ICD-10-CM | POA: Diagnosis not present

## 2020-03-10 DIAGNOSIS — M25559 Pain in unspecified hip: Secondary | ICD-10-CM | POA: Insufficient documentation

## 2020-03-10 DIAGNOSIS — D509 Iron deficiency anemia, unspecified: Secondary | ICD-10-CM

## 2020-03-10 DIAGNOSIS — Z823 Family history of stroke: Secondary | ICD-10-CM | POA: Insufficient documentation

## 2020-03-10 DIAGNOSIS — Z8269 Family history of other diseases of the musculoskeletal system and connective tissue: Secondary | ICD-10-CM | POA: Insufficient documentation

## 2020-03-10 DIAGNOSIS — D631 Anemia in chronic kidney disease: Secondary | ICD-10-CM | POA: Insufficient documentation

## 2020-03-10 DIAGNOSIS — I129 Hypertensive chronic kidney disease with stage 1 through stage 4 chronic kidney disease, or unspecified chronic kidney disease: Secondary | ICD-10-CM | POA: Diagnosis not present

## 2020-03-10 DIAGNOSIS — Z79899 Other long term (current) drug therapy: Secondary | ICD-10-CM | POA: Insufficient documentation

## 2020-03-10 DIAGNOSIS — Z87891 Personal history of nicotine dependence: Secondary | ICD-10-CM | POA: Insufficient documentation

## 2020-03-10 DIAGNOSIS — M549 Dorsalgia, unspecified: Secondary | ICD-10-CM | POA: Insufficient documentation

## 2020-03-10 DIAGNOSIS — Z888 Allergy status to other drugs, medicaments and biological substances status: Secondary | ICD-10-CM | POA: Diagnosis not present

## 2020-03-10 DIAGNOSIS — Z833 Family history of diabetes mellitus: Secondary | ICD-10-CM | POA: Insufficient documentation

## 2020-03-10 DIAGNOSIS — Z880 Allergy status to penicillin: Secondary | ICD-10-CM | POA: Insufficient documentation

## 2020-03-10 DIAGNOSIS — Z820 Family history of epilepsy and other diseases of the nervous system: Secondary | ICD-10-CM | POA: Insufficient documentation

## 2020-03-10 LAB — COMPREHENSIVE METABOLIC PANEL
ALT: 13 U/L (ref 0–44)
AST: 17 U/L (ref 15–41)
Albumin: 3.7 g/dL (ref 3.5–5.0)
Alkaline Phosphatase: 62 U/L (ref 38–126)
Anion gap: 7 (ref 5–15)
BUN: 19 mg/dL (ref 8–23)
CO2: 28 mmol/L (ref 22–32)
Calcium: 9.3 mg/dL (ref 8.9–10.3)
Chloride: 102 mmol/L (ref 98–111)
Creatinine, Ser: 1.21 mg/dL — ABNORMAL HIGH (ref 0.44–1.00)
GFR calc Af Amer: 50 mL/min — ABNORMAL LOW (ref >60)
GFR calc non Af Amer: 43 mL/min — ABNORMAL LOW (ref >60)
Glucose, Bld: 104 mg/dL — ABNORMAL HIGH (ref 70–99)
Potassium: 4.6 mmol/L (ref 3.5–5.1)
Sodium: 137 mmol/L (ref 135–145)
Total Bilirubin: 0.6 mg/dL (ref 0.3–1.2)
Total Protein: 7 g/dL (ref 6.5–8.1)

## 2020-03-10 LAB — CBC WITH DIFFERENTIAL/PLATELET
Abs Immature Granulocytes: 0.01 10*3/uL (ref 0.00–0.07)
Basophils Absolute: 0 10*3/uL (ref 0.0–0.1)
Basophils Relative: 1 %
Eosinophils Absolute: 0.6 10*3/uL — ABNORMAL HIGH (ref 0.0–0.5)
Eosinophils Relative: 11 %
HCT: 33.1 % — ABNORMAL LOW (ref 36.0–46.0)
Hemoglobin: 10.8 g/dL — ABNORMAL LOW (ref 12.0–15.0)
Immature Granulocytes: 0 %
Lymphocytes Relative: 44 %
Lymphs Abs: 2.3 10*3/uL (ref 0.7–4.0)
MCH: 29.1 pg (ref 26.0–34.0)
MCHC: 32.6 g/dL (ref 30.0–36.0)
MCV: 89.2 fL (ref 80.0–100.0)
Monocytes Absolute: 0.5 10*3/uL (ref 0.1–1.0)
Monocytes Relative: 10 %
Neutro Abs: 1.8 10*3/uL (ref 1.7–7.7)
Neutrophils Relative %: 34 %
Platelets: 236 10*3/uL (ref 150–400)
RBC: 3.71 MIL/uL — ABNORMAL LOW (ref 3.87–5.11)
RDW: 13 % (ref 11.5–15.5)
WBC: 5.2 10*3/uL (ref 4.0–10.5)
nRBC: 0 % (ref 0.0–0.2)

## 2020-03-10 LAB — IRON AND TIBC
Iron: 58 ug/dL (ref 28–170)
Saturation Ratios: 21 % (ref 10.4–31.8)
TIBC: 280 ug/dL (ref 250–450)
UIBC: 222 ug/dL

## 2020-03-10 LAB — FERRITIN: Ferritin: 127 ng/mL (ref 11–307)

## 2020-03-10 MED ORDER — FENTANYL 50 MCG/HR TD PT72: 1.0000 | MEDICATED_PATCH | TRANSDERMAL | 0 refills | Status: DC

## 2020-03-10 NOTE — Progress Notes (Signed)
Pt here for follow up. No concerns voiced.  

## 2020-03-10 NOTE — Telephone Encounter (Signed)
Refill request for fentanyl, last seen 12/31/19, last filled 02-07-20.  Please advise.

## 2020-03-10 NOTE — Telephone Encounter (Signed)
Patient needs a refill on her fentaNYL (DURAGESIC) 50 MCG/HR, patient is wearing the last patch.

## 2020-03-10 NOTE — Telephone Encounter (Signed)
Refilled for today and next month,  Needs OV prior to July 16

## 2020-03-10 NOTE — Progress Notes (Addendum)
Hematology/Oncology follow up  note [facility] [phone] [phone]   Patient Care Team: Crecencio Mc, MD as PCP - General (Internal Medicine)  REFERRING PROVIDER: Crecencio Mc, MD REASON FOR VISIT Follow up for management of anemia   HISTORY OF PRESENTING ILLNESS:  Sabrina [name] is a  79 y.o.  female with PMH listed below who was referred to me for evaluation of anemia.  Patient recently had lab work done which revealed anemia with hemoglobin 11.5.   Reviewed patient's previous labs, her hemoglobin ranges from 10.8 to 11.8 in the past.  Reports feeling fatigue.  Patient denies weight loss, easy bruising, hematochezia, hemoptysis.  Has history of CKD and follows up with nephrologist.  Chronic pain syndrome. On Tramadol and fentanyl patch.  INTERVAL HISTORY Sabrina [name] is a 79 y.o. female who has above history reviewed by me today presents for follow up visit for management of anemia.  Patient's blood pressure is high.  She says that she has whitecoat syndrome and always have high blood pressure at doctor's office. Patient denies any new complaints. She declines IV iron treatments in the past.  Currently takes oral iron supplementation Slow Fe once daily. She continues to have chronic back pain, hip pain.   Review of Systems  Constitutional: Negative for chills, fever, malaise/fatigue and weight loss.  HENT: Negative for sore throat.   Eyes: Negative for redness.  Respiratory: Negative for cough, shortness of breath and wheezing.   Cardiovascular: Negative for chest pain, palpitations and leg swelling.  Gastrointestinal: Negative for abdominal pain, blood in stool, nausea and vomiting.  Genitourinary: Negative for dysuria.  Musculoskeletal: Positive for back pain. Negative for myalgias.  Skin: Negative for rash.  Neurological: Negative for dizziness, tingling and tremors.  Endo/Heme/Allergies: Does not bruise/bleed  easily.  Psychiatric/Behavioral: Negative for hallucinations.    MEDICAL HISTORY:  Past Medical History:  Diagnosis Date  . Anemia of chronic kidney failure   . Anxiety   . CAD (coronary artery disease)   . Carotid artery stenosis   . Chronic kidney disease, stage III (moderate)    Followed by Dr. Juleen China  . Hyperlipidemia   . Hypertension   . Renal artery stenosis ([place])   . Secondary hyperparathyroidism ([place])   . 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 ABDOMINAL HYSTERECTOMY  1976  . CAROTID ARTERY ANGIOPLASTY Left   . CAROTID ENDARTERECTOMY Left   . CHOLECYSTECTOMY    . COLONOSCOPY WITH PROPOFOL N/A 08/16/2017   Procedure: COLONOSCOPY WITH PROPOFOL;  Surgeon: [name], MD;  Location: [facility] ENDOSCOPY;  Service: Endoscopy;  Laterality: N/A;  . CORONARY ANGIOPLASTY WITH STENT PLACEMENT  2000  . CORONARY ARTERY BYPASS GRAFT  2000  . CYSTOSCOPY WITH STENT PLACEMENT Bilateral   . ESOPHAGOGASTRODUODENOSCOPY (EGD) WITH PROPOFOL N/A 08/16/2017   Procedure: ESOPHAGOGASTRODUODENOSCOPY (EGD) WITH PROPOFOL;  Surgeon: [name], MD;  Location: [facility] ENDOSCOPY;  Service: Endoscopy;  Laterality: N/A;  . ESOPHAGOGASTRODUODENOSCOPY (EGD) WITH PROPOFOL N/A 06/29/2018   Procedure: ESOPHAGOGASTRODUODENOSCOPY (EGD) WITH PROPOFOL;  Surgeon: Virgel Manifold, MD;  Location: [facility] ENDOSCOPY;  Service: Endoscopy;  Laterality: N/A;  . JOINT REPLACEMENT Bilateral 2000  . RENAL ARTERY ANGIOPLASTY Bilateral mARCH 2015  . TOE AMPUTATION Right    small toe  . TONSILLECTOMY AND ADENOIDECTOMY    . TOTAL HIP ARTHROPLASTY Left   . TOTAL HIP  ARTHROPLASTY Right 2015    SOCIAL HISTORY: Social History   Socioeconomic History  . Marital status: Divorced    Spouse name: Not on file  . Number of children: Not on file  . Years of education: Not on file  . Highest education  level: Not on file  Occupational History  . Occupation: retired  Tobacco Use  . Smoking status: Former Smoker    Quit date: 10/25/1998    Years since quitting: 21.3  . Smokeless tobacco: Never Used  Substance and Sexual Activity  . Alcohol use: No    Alcohol/week: 0.0 standard drinks  . Drug use: Never  . Sexual activity: Not Currently  Other Topics Concern  . Not on file  Social History Narrative  . Not on file   Social Determinants of Health   Financial Resource Strain:   . Difficulty of Paying Living Expenses:   Food Insecurity:   . Worried About Charity fundraiser in the Last Year:   . Arboriculturist in the Last Year:   Transportation Needs:   . Film/video editor (Medical):   Marland Kitchen Lack of Transportation (Non-Medical):   Physical Activity:   . Days of Exercise per Week:   . Minutes of Exercise per Session:   Stress:   . Feeling of Stress :   Social Connections:   . Frequency of Communication with Friends and Family:   . Frequency of Social Gatherings with Friends and Family:   . Attends Religious Services:   . Active Member of Clubs or Organizations:   . Attends Archivist Meetings:   Marland Kitchen Marital Status:   Intimate Partner Violence:   . Fear of Current or Ex-Partner:   . Emotionally Abused:   Marland Kitchen Physically Abused:   . Sexually Abused:     FAMILY HISTORY: Family History  Problem Relation Age of Onset  . Stroke Mother   . Hypertension Mother   . Diabetes Mother   . Hypertension Father   . Heart disease Sister        MI  . Multiple sclerosis Daughter   . Multiple sclerosis Son   . Cerebral aneurysm Son   . Seizures Son   . Cerebral aneurysm Son   . Breast cancer Paternal Aunt 43    ALLERGIES:  is allergic to citalopram; dilaudid [hydromorphone hcl]; hydrochlorothiazide; nsaids; nubain [nalbuphine hcl]; penicillins; prasugrel; and statins.  MEDICATIONS:  Current Outpatient Medications  Medication Sig Dispense Refill  . aspirin 81 MG tablet  Take 81 mg by mouth daily.    . chlordiazePOXIDE (LIBRIUM) 25 MG capsule TAKE ONE CAPSULE BY MOUTH ONCE DAILY AS NEEDED FOR ANXIETY 30 capsule 5  . clopidogrel (PLAVIX) 75 MG tablet Take 1 tablet (75 mg total) by mouth daily. 90 tablet 3  . fentaNYL (DURAGESIC) 50 MCG/HR Place 1 patch onto the skin every 3 (three) days. 10 patch 0  . ferrous sulfate (FEROSUL) 325 (65 FE) MG tablet Take 1 tablet (325 mg total) by mouth daily with breakfast. 90 tablet 0  . hydrALAZINE (APRESOLINE) 50 MG tablet TAKE 1 TABLET BY MOUTH THREE TIMES DAILY AS NEEDED FOR BLOOD PRESSURE GREATER THAN 150 270 tablet 1  . labetalol (NORMODYNE) 200 MG tablet Take 1 tablet (200 mg total) by mouth 2 (two) times daily. 180 tablet 1  . lisinopril (ZESTRIL) 20 MG tablet Take 1 tablet (20 mg total) by mouth 2 (two) times daily. 180 tablet 1  . NARCAN 4 MG/0.1ML LIQD nasal spray  kit     . nortriptyline (PAMELOR) 10 MG capsule Take 1 capsule (10 mg total) by mouth 2 (two) times daily. 180 capsule 1  . Omega 3 1000 MG CAPS Take 1 capsule by mouth 2 (two) times daily.     . pantoprazole (PROTONIX) 40 MG tablet Take 1 tablet (40 mg total) by mouth daily. On an empty stomach  For gastritis 30 tablet 3  . traMADol (ULTRAM) 50 MG tablet TAKE 2 TABLETS BY MOUTH EVERY 12 HOURS AS NEEDED. MAX OF 4 TABLETS DAILY. 120 tablet 0  . ZETIA 10 MG tablet Take 1 tablet (10 mg total) by mouth daily. 90 tablet 1   No current facility-administered medications for this visit.     PHYSICAL EXAMINATION: ECOG PERFORMANCE STATUS: 1 - Symptomatic but completely ambulatory Vitals:   03/10/20 1333  BP: (!) 185/73  Pulse: 69  Resp: 18  Temp: (!) 95.7 F (35.4 C)   Filed Weights   03/10/20 1333  Weight: 161 lb 6.4 oz (73.2 kg)    Physical Exam Constitutional:      General: She is not in acute distress.    Appearance: She is well-developed.  HENT:     Head: Normocephalic and atraumatic.     Right Ear: External ear normal.     Left Ear: External  ear normal.  Eyes:     General: No scleral icterus.    Conjunctiva/sclera: Conjunctivae normal.     Pupils: Pupils are equal, round, and reactive to light.  Cardiovascular:     Rate and Rhythm: Normal rate and regular rhythm.     Heart sounds: Murmur present.  Pulmonary:     Effort: Pulmonary effort is normal. No respiratory distress.     Breath sounds: Normal breath sounds. No wheezing or rales.  Chest:     Chest wall: No tenderness.  Abdominal:     General: Bowel sounds are normal. There is no distension.     Palpations: Abdomen is soft. There is no mass.     Tenderness: There is no abdominal tenderness.  Musculoskeletal:        General: No deformity. Normal range of motion.     Cervical back: Normal range of motion and neck supple.  Lymphadenopathy:     Cervical: No cervical adenopathy.  Skin:    General: Skin is warm and dry.     Findings: No erythema or rash.  Neurological:     Mental Status: She is alert and oriented to person, place, and time. Mental status is at baseline.     Cranial Nerves: No cranial nerve deficit.     Coordination: Coordination normal.  Psychiatric:        Mood and Affect: Mood normal.      LABORATORY DATA:  I have reviewed the data as listed Lab Results  Component Value Date   WBC 5.5 09/14/2019   HGB 11.0 (L) 09/14/2019   HCT 33.9 (L) 09/14/2019   MCV 88.1 09/14/2019   PLT 202 09/14/2019   Recent Labs    05/23/19 1256 09/14/19 1255  NA 139 135  K 4.9 4.0  CL 104 102  CO2 28 26  GLUCOSE 112* 122*  BUN 25* 17  CREATININE 1.32* 1.04*  CALCIUM 9.4 9.1  GFRNONAA 39* 52*  GFRAA 45* >60  PROT 7.2 7.1  ALBUMIN 3.7 3.9  AST 18 20  ALT 16 17  ALKPHOS 72 65  BILITOT 0.5 0.6       ASSESSMENT & PLAN:  1. Anemia due to stage 3a chronic kidney disease    Labs reviewed and discussed with patient. Patient's hemoglobin level is stable around 10.8, slightly lower than 6 months ago. Anemia is most likely secondary to chronic kidney  insufficiency. Discussed with patient about follow-up with primary care provider and nephrologist for chronic kidney disease. Recommend patient to continue oral iron supplementation Slow Fe daily. No need for erythropoietin therapy until hemoglobin is less than 10  Uncontrolled blood pressure, patient reports that she has whitecoat syndrome. Recommend patient to continue follow-up with primary care provider closely and monitor blood pressure at home. Return of visit: 6 months. Earlie Server, MD, PhD Hematology Oncology [place] [facility] 03/10/2020

## 2020-03-16 NOTE — Assessment & Plan Note (Signed)
Per hematology:  Patient's hemoglobin level is stable around 10.8, slightly lower than 6 months ago. Anemia is most likely secondary to chronic kidney insufficiency. Discussed with patient about follow-up with primary care provider and nephrologist for chronic kidney disease. Recommend patient to continue oral iron supplementation Slow Fe daily. No need for erythropoietin therapy until hemoglobin is less than 10   Return of visit: 6 months.

## 2020-03-18 DIAGNOSIS — R0602 Shortness of breath: Secondary | ICD-10-CM | POA: Diagnosis not present

## 2020-03-18 DIAGNOSIS — R011 Cardiac murmur, unspecified: Secondary | ICD-10-CM | POA: Diagnosis not present

## 2020-03-18 DIAGNOSIS — I350 Nonrheumatic aortic (valve) stenosis: Secondary | ICD-10-CM

## 2020-03-18 DIAGNOSIS — I208 Other forms of angina pectoris: Secondary | ICD-10-CM | POA: Diagnosis not present

## 2020-03-18 HISTORY — DX: Nonrheumatic aortic (valve) stenosis: I35.0

## 2020-03-26 ENCOUNTER — Telehealth: Payer: Self-pay | Admitting: *Deleted

## 2020-03-26 DIAGNOSIS — R42 Dizziness and giddiness: Secondary | ICD-10-CM | POA: Diagnosis not present

## 2020-03-26 DIAGNOSIS — R0602 Shortness of breath: Secondary | ICD-10-CM | POA: Diagnosis not present

## 2020-03-26 DIAGNOSIS — I25118 Atherosclerotic heart disease of native coronary artery with other forms of angina pectoris: Secondary | ICD-10-CM | POA: Diagnosis not present

## 2020-03-26 DIAGNOSIS — I25810 Atherosclerosis of coronary artery bypass graft(s) without angina pectoris: Secondary | ICD-10-CM | POA: Diagnosis not present

## 2020-03-26 DIAGNOSIS — I359 Nonrheumatic aortic valve disorder, unspecified: Secondary | ICD-10-CM | POA: Diagnosis not present

## 2020-03-26 DIAGNOSIS — R011 Cardiac murmur, unspecified: Secondary | ICD-10-CM | POA: Diagnosis not present

## 2020-03-26 DIAGNOSIS — I10 Essential (primary) hypertension: Secondary | ICD-10-CM | POA: Diagnosis not present

## 2020-03-26 DIAGNOSIS — I208 Other forms of angina pectoris: Secondary | ICD-10-CM | POA: Diagnosis not present

## 2020-03-26 DIAGNOSIS — K219 Gastro-esophageal reflux disease without esophagitis: Secondary | ICD-10-CM | POA: Diagnosis not present

## 2020-03-26 DIAGNOSIS — E785 Hyperlipidemia, unspecified: Secondary | ICD-10-CM | POA: Diagnosis not present

## 2020-03-26 NOTE — Telephone Encounter (Signed)
Left message with patient that we need to speak to her prior to her SCS trial evaluation to go over History form.

## 2020-03-27 ENCOUNTER — Other Ambulatory Visit: Payer: Self-pay

## 2020-03-27 ENCOUNTER — Ambulatory Visit
Payer: Medicare Other | Attending: Student in an Organized Health Care Education/Training Program | Admitting: Student in an Organized Health Care Education/Training Program

## 2020-03-27 ENCOUNTER — Encounter: Payer: Self-pay | Admitting: Student in an Organized Health Care Education/Training Program

## 2020-03-27 VITALS — BP 136/81 | HR 70 | Temp 97.8°F | Resp 16 | Ht 61.0 in | Wt 162.0 lb

## 2020-03-27 DIAGNOSIS — M5416 Radiculopathy, lumbar region: Secondary | ICD-10-CM | POA: Diagnosis not present

## 2020-03-27 DIAGNOSIS — G8929 Other chronic pain: Secondary | ICD-10-CM | POA: Diagnosis not present

## 2020-03-27 DIAGNOSIS — G894 Chronic pain syndrome: Secondary | ICD-10-CM | POA: Diagnosis not present

## 2020-03-27 DIAGNOSIS — M47816 Spondylosis without myelopathy or radiculopathy, lumbar region: Secondary | ICD-10-CM | POA: Insufficient documentation

## 2020-03-27 NOTE — Progress Notes (Signed)
Patient: Sabrina [name]  Service Category: E/M  Provider: Gillis Santa, MD  DOB: [birth year]  DOS: 03/27/2020  Referring Provider: Harvest Dark, FNP  MRN: [id number]  Setting: Ambulatory outpatient  PCP: Crecencio Mc, MD  Type: New Patient  Specialty: Interventional Pain Management    Location: Office  Delivery: Face-to-face     Primary Reason(s) for Visit: Encounter for initial evaluation of one or more chronic problems (new to examiner) potentially causing chronic pain, and posing a threat to normal musculoskeletal function. (Level of risk: High) CC: Back Pain  HPI  Sabrina [name] is a 79 y.o. year old, female patient, who comes today to see [place] for the first time for an initial evaluation of her chronic pain. 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, stage III; Peripheral vascular disease ([place]); CAD (coronary artery disease); Generalized anxiety disorder; Chronic epigastric pain; Chronic right hip pain; GERD (gastroesophageal reflux disease); Statin intolerance; Chronic daily headache; Cervical spondylosis without myelopathy; Dizziness; Palpitations; Atherosclerosis of renal artery ([place]); Carotid stenosis; Chronic breast pain; Aortic aneurysm, abdominal ([place]); Aortic valve disorder; Chronic radicular lumbar pain; Lumbar stenosis with neurogenic claudication; Osteoarthritis of right hip; 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[place]); Anemia; Stomach irritation; Educated about COVID-19 virus infection; Swelling of right hand; Ankle swelling; Pain due to onychomycosis of toenails of both feet; Callus; Amputation of fifth toe, right, traumatic, subsequent encounter ([place]); Thumb pain, left; Chronic pain syndrome; and Lumbar  spondylosis on their problem list. Today she comes in for evaluation of her Back Pain  Pain Assessment: Location: Right, Left Back Radiating: down the left leg and worse on the left. The pain also wraps around to both sides of the groin. Onset: More than a month ago Duration: Chronic pain Quality: Constant, Burning, Aching Severity: 10-Worst pain ever/10 (subjective, self-reported pain score)  Effect on ADL: Has trouble doing anything for longer than a few minutes. Timing: Constant Modifying factors: sitting BP: 136/81  HR: 70  Onset and Duration: Date of onset: 2004 Cause of pain: Unknown Severity: Getting worse and NAS-11 on the average: 10/10 Timing: Not influenced by the time of the day, During activity or exercise and After activity or exercise Aggravating Factors: Bending, Kneeling, Lifiting, Prolonged standing, Squatting, Stooping , Twisting and Walking Alleviating Factors: Sitting Associated Problems: Constipation, Night-time cramps, Dizziness, Fatigue and Numbness Quality of Pain: Aching, Burning, Intermittent, Cramping, Tiring and Uncomfortable Previous Examinations or Tests: MRI scan Previous Treatments: Epidural steroid injections  The patient comes into the clinics today for the first time for a chronic pain management evaluation.   Sabrina [name] is a very pleasant 79 year old female who presents with a chief complaint of low back pain with radiation into bilateral legs in a lateral dermatomal fashion.  Her left leg radiation is greater than her right.  She also has radiation into her left groin region greater than her right groin region.  She intermittently states that she has a severe burning sensation when she is standing up that almost causes her to pass out because of severe pain and burning that shoots down both of her legs.  This happens very infrequently.  She does find this scary.  She is being referred from physical medicine and rehab for consideration of spinal cord  stimulator trial which she is knows nothing about.  She has  participated in physical therapy last year and even did home-based PT exercises which she states were not beneficial.  She is status post bilateral hip replacement.  Her pain increases with weightbearing and internal rotation.  She is prescribed fentanyl patch and tramadol by her PCP.  She has had lumbar epidural steroid injections in the past which were not beneficial, her previous 1 being 01/17/2020.  She states that best she got 5 to 7 days of pain relief with them.  She is currently on Plavix due to coronary and renal stent in place.  She is on aspirin and Plavix.  NSAIDs are avoided to minimize bleeding risk.  Historic Controlled Substance Pharmacotherapy Review  PMP and historical list of controlled substances: 03/11/2020  2   03/10/2020  Fentanyl 50 Mcg/Hr Patch  10.00  [address] [place]   [id number]   [facility] ([id number])   0  120.00 MME  Medicare   [place]  02/08/2020  3   02/07/2020  Fentanyl 50 Mcg/Hr Patch  10.00  [address] [place]   [id number]   [facility] ([id number])   0  120.00 MME  Medicare   [place]    Medications: The patient did not bring the medication(s) to the appointment, as requested in our "New Patient Package" Pharmacodynamics: Desired effects: Analgesia: The patient reports <50% benefit. Reported improvement in function: The patient reports medication allows her to accomplish basic ADLs. Clinically meaningful improvement in function (CMIF): Sustained CMIF goals met Perceived effectiveness: Described as relatively effective but with some room for improvement Undesirable effects: Side-effects or Adverse reactions: None reported Historical Monitoring: The patient  reports no history of drug use. List of all UDS Test(s): No results found for: MDMA, COCAINSCRNUR, [place], [place], CANNABQUANT, THCU, [place] List of other Serum/Urine Drug Screening Test(s):  No results found for: AMPHSCRSER, BARBSCRSER, BENZOSCRSER, COCAINSCRSER, COCAINSCRNUR, PCPSCRSER, PCPQUANT,  THCSCRSER, THCU, CANNABQUANT, OPIATESCRSER, OXYSCRSER, PROPOXSCRSER, ETH Historical Background Evaluation: [place] PMP: PDMP reviewed during this encounter. Six (6) year initial data search conducted.             [place] Department of public safety, offender search: Editor, commissioning Information) Non-contributory Risk Assessment Profile: Aberrant behavior: None observed or detected today Risk factors for fatal opioid overdose: None identified today Fatal overdose hazard ratio (HR): Calculation deferred Non-fatal overdose hazard ratio (HR): Calculation deferred Risk of opioid abuse or dependence: 0.7-3.0% with doses ? 36 MME/day and 6.1-26% with doses ? 120 MME/day. Substance use disorder (SUD) risk level: See below Personal History of Substance Abuse (SUD-Substance use disorder):  Alcohol: Negative  Illegal Drugs: Negative  Rx Drugs: Negative  ORT Risk Level calculation: Low Risk Opioid Risk Tool - 03/27/20 0858      Family History of Substance Abuse   Alcohol  Negative    Illegal Drugs  Negative    Rx Drugs  Negative      Personal History of Substance Abuse   Alcohol  Negative    Illegal Drugs  Negative    Rx Drugs  Negative      Age   Age between 67-45 years   No      History of Preadolescent Sexual Abuse   History of Preadolescent Sexual Abuse  Negative or Female      Psychological Disease   Psychological Disease  Negative    Depression  Negative      Total Score   Opioid Risk Tool Scoring  0    Opioid Risk Interpretation  Low Risk      ORT Scoring interpretation  table:  Score <3 = Low Risk for SUD  Score between 4-7 = Moderate Risk for SUD  Score >8 = High Risk for Opioid Abuse   PHQ-2 Depression Scale:  Total score: 0  PHQ-2 Scoring interpretation table: (Score and probability of major depressive disorder)  Score 0 = No depression  Score 1 = 15.4% Probability  Score 2 = 21.1% Probability  Score 3 = 38.4% Probability  Score 4 = 45.5% Probability  Score 5 = 56.4% Probability   Score 6 = 78.6% Probability   PHQ-9 Depression Scale:  Total score: 0  PHQ-9 Scoring interpretation table:  Score 0-4 = No depression  Score 5-9 = Mild depression  Score 10-14 = Moderate depression  Score 15-19 = Moderately severe depression  Score 20-27 = Severe depression (2.4 times higher risk of SUD and 2.89 times higher risk of overuse)   Pharmacologic Plan: Non-opioid analgesic therapy offered.  Continue medication management with PCP.  I informed the patient that we will not be taking over her fentanyl patch.  We will be evaluating her for interventional pain therapies only.            Initial impression: N/A interventional management  Meds   Current Outpatient Medications:  .  aspirin 81 MG tablet, Take 81 mg by mouth daily., Disp: , Rfl:  .  chlordiazePOXIDE (LIBRIUM) 25 MG capsule, TAKE ONE CAPSULE BY MOUTH ONCE DAILY AS NEEDED FOR ANXIETY, Disp: 30 capsule, Rfl: 5 .  clopidogrel (PLAVIX) 75 MG tablet, Take 1 tablet (75 mg total) by mouth daily., Disp: 90 tablet, Rfl: 3 .  fentaNYL (DURAGESIC) 50 MCG/HR, Place 1 patch onto the skin every 3 (three) days., Disp: 10 patch, Rfl: 0 .  [START ON 04/09/2020] fentaNYL (DURAGESIC) 50 MCG/HR, Place 1 patch onto the skin every 3 (three) days., Disp: 10 patch, Rfl: 0 .  ferrous sulfate (FEROSUL) 325 (65 FE) MG tablet, Take 1 tablet (325 mg total) by mouth daily with breakfast., Disp: 90 tablet, Rfl: 0 .  hydrALAZINE (APRESOLINE) 50 MG tablet, TAKE 1 TABLET BY MOUTH THREE TIMES DAILY AS NEEDED FOR BLOOD PRESSURE GREATER THAN 150, Disp: 270 tablet, Rfl: 1 .  labetalol (NORMODYNE) 200 MG tablet, Take 1 tablet (200 mg total) by mouth 2 (two) times daily., Disp: 180 tablet, Rfl: 1 .  lisinopril (ZESTRIL) 20 MG tablet, Take 1 tablet (20 mg total) by mouth 2 (two) times daily., Disp: 180 tablet, Rfl: 1 .  nortriptyline (PAMELOR) 10 MG capsule, Take 1 capsule (10 mg total) by mouth 2 (two) times daily., Disp: 180 capsule, Rfl: 1 .  Omega 3 1000 MG  CAPS, Take 1 capsule by mouth 2 (two) times daily. , Disp: , Rfl:  .  pantoprazole (PROTONIX) 40 MG tablet, Take 1 tablet (40 mg total) by mouth daily. On an empty stomach  For gastritis, Disp: 30 tablet, Rfl: 3 .  traMADol (ULTRAM) 50 MG tablet, TAKE 2 TABLETS BY MOUTH EVERY 12 HOURS AS NEEDED. MAX OF 4 TABLETS DAILY., Disp: 120 tablet, Rfl: 0 .  ZETIA 10 MG tablet, Take 1 tablet (10 mg total) by mouth daily., Disp: 90 tablet, Rfl: 1 .  NARCAN 4 MG/0.1ML LIQD nasal spray kit, , Disp: , Rfl:   Imaging Review  Cervical Imaging: Cervical MR wo contrast:  Results for orders placed during the hospital encounter of 07/14/18  MR Cervical Spine Wo Contrast   Narrative CLINICAL DATA:  Degenerative disc disease and radiculopathy. Spinal osteoarthritis  EXAM: MRI CERVICAL SPINE  WITHOUT CONTRAST  TECHNIQUE: Multiplanar, multisequence MR imaging of the cervical spine was performed. No intravenous contrast was administered.  COMPARISON:  08/11/2015  FINDINGS: Alignment: Physiologic.  Vertebrae: No fracture, evidence of discitis, or bone lesion.  Cord: Normal signal  Posterior Fossa, vertebral arteries, paraspinal tissues: 10 mm right posterior thyroid nodule that was also seen in 2016.  Disc levels:  C2-3: Mild disc bulging.  No impingement  C3-4: Disc narrowing and bulging with a central protrusion flattening the cord against the mildly thickened ligamentum flavum. Mild uncovertebral spurring. The foramina are patent  C4-5: Disc narrowing and bulging with uncovertebral spurring greater on the left. There is central protrusion flattening the cord against the ligamentum flavum. Advanced left foraminal stenosis.  C5-6: Disc narrowing and bulging with endplate and uncovertebral ridging. Disc protrusion flattens the cord. There is biforaminal impingement  C6-7: Disc narrowing and bulging with left uncovertebral spurring. Mild left foraminal  stenosis.  C7-T1:Unremarkable.  IMPRESSION: 1. No significant change when compared to 2016. 2. There is spinal stenosis with cord flattening from disc protrusion at C3-4 to C5-6. 3. C4-5 left and C5-6 bilateral foraminal impingement.   Electronically Signed   By: Monte Fantasia M.D.   On: 07/14/2018 15:00    Results for orders placed during the hospital encounter of 07/22/15  DG Cervical Spine Complete   Narrative CLINICAL DATA:  79 year old female with constant headaches and neck pain. No reported history of trauma. Prior carotid surgery. Initial encounter.  EXAM: CERVICAL SPINE  4+ VIEWS  COMPARISON:  None.  FINDINGS: Normal alignment cervical spine.  Minimal C4-5 and mild C5-6 disc space narrowing with osteophyte formation.  C4-5 and C5-6 foraminal narrowing greatest on the right at the C5-6 level.  Lung apices are clear.  Vascular calcifications included prominent right carotid bifurcation calcifications. Prior left carotid endarterectomy.  IMPRESSION: Minimal C4-5 and mild C5-6 disc space narrowing with osteophyte.  C4-5 and C5-6 foraminal narrowing greatest on the right at the C5-6 level   Electronically Signed   By: Genia Del M.D.   On: 07/22/2015 14:44      Lumbosacral Imaging: Lumbar MR wo contrast:  Results for orders placed during the hospital encounter of 07/19/19  MR LUMBAR SPINE WO CONTRAST   Narrative CLINICAL DATA:  Chronic low back pain with left-sided sciatica, unspecified back pain laterality. Additional history provided: Pain across lower back left greater than right since 2000, left greater than right hip and buttock pain.  EXAM: MRI LUMBAR SPINE WITHOUT CONTRAST  TECHNIQUE: Multiplanar, multisequence MR imaging of the lumbar spine was performed. No intravenous contrast was administered.  COMPARISON:  Lumbar spine MRI 05/05/2016  FINDINGS: Segmentation: 5 lumbar vertebrae.  Alignment: Lumbar dextrocurvature. Trace  L2-L3 retrolisthesis. 5 mm L4-L5 grade 1 anterolisthesis, unchanged.  Vertebrae: Vertebral body height is maintained. No suspicious osseous lesions or marrow edema.  Conus medullaris and cauda equina: Conus extends to the L1-L2 level. No signal abnormality within the visualized distal spinal cord.  Paraspinal and other soft tissues: Tiny T2 hyperintense lesions within the bilateral kidneys likely reflects cysts. Included portions of the abdomen otherwise unremarkable. Atrophy of the lumbar paraspinal musculature.  Disc levels:  Mild multilevel disc degeneration.  Unless otherwise stated, the level by level findings below have not significantly changed since prior MRI 05/05/2016.  T12-L1: Mild facet arthrosis. No significant canal or foraminal stenosis.  L1-L2: Mild facet arthrosis. No significant canal or foraminal stenosis.  L2-L3: Trace retrolisthesis. Small disc bulge. Mild facet arthrosis. No significant spinal  canal or neural foraminal narrowing.  L3-L4: Disc bulge asymmetric to the left. Facet arthrosis/ligamentum flavum hypertrophy. Mild bilateral subarticular narrowing (greater on the left) with possible contact upon the descending left L4 nerve root. No significant central canal stenosis. Mild left neural foraminal narrowing. Left subarticular narrowing may be slightly progressed with findings otherwise unchanged.  L4-L5: Grade 1 anterolisthesis with disc uncovering. Prominent facet arthrosis/ligamentum flavum hypertrophy (greater on the left). Bilateral subarticular narrowing (greater on the left) with likely impingement upon the descending left L5 nerve root. Mild central canal stenosis. Mild bilateral neural foraminal narrowing.  L5-S1: Small disc bulge with endplate spurring. Mild facet arthrosis. Mild bilateral subarticular narrowing with possible contact upon the descending S1 nerve roots. No significant central canal stenosis. Mild bilateral neural  foraminal narrowing  IMPRESSION: Lumbar spondylosis as detailed. Multifactorial mild L3-L4 left subarticular narrowing with possible contact upon the descending left L4 nerve root may be slightly progressed. Findings otherwise similar to prior MRI 05/05/2016.  At L4-L5, degenerative grade 1 anterolisthesis with prominent facet arthrosis/ligamentum flavum hypertrophy. Bilateral subarticular narrowing (greater on the left) with probable impingement of the descending left L5 nerve root. Mild relative central canal stenosis and bilateral neural foraminal narrowing.  No more than mild spinal canal stenosis at the remaining levels. Additional sites of mild neural foraminal narrowing as described.   Electronically Signed   By: Kellie Simmering   On: 07/20/2019 10:22     Results for orders placed during the hospital encounter of 12/28/13  DG Lumbar Spine Complete   Narrative CLINICAL DATA:  79 year old female with back pain. Initial encounter.  EXAM: LUMBAR SPINE - COMPLETE 4+ VIEW  COMPARISON:  None.  FINDINGS: Normal lumbar segmentation. Bone mineralization is within normal limits for age. Lumbar vertebral height and alignment preserved. Relatively preserved disc spaces. Visible sacral ala and SI joints grossly normal.  Aortoiliac calcified atherosclerosis noted. Bilateral iliac artery stents. Bilateral renal artery stents. Right upper quadrant surgical clips.  Left total hip arthroplasty. Advanced joint space loss, subchondral sclerosis, and osteophytosis the right hip.  IMPRESSION: 1.  No acute osseous abnormality identified in the lumbar spine 2. Advanced right hip osteoarthritis. 3. Advanced arterial atherosclerosis. Bilateral iliac and renal artery stents.   Electronically Signed   By: Lars Pinks M.D.   On: 12/28/2013 15:00            Complexity Note: Imaging results reviewed. Results shared with Ms. Dingledine, using Layman's terms.                         ROS   Cardiovascular: Daily Aspirin intake, High blood pressure, Heart surgery, Heart murmur, Heart catheterization and Blood thinners:  Antiplatelet Pulmonary or Respiratory: No reported pulmonary signs or symptoms such as wheezing and difficulty taking a deep full breath (Asthma), difficulty blowing air out (Emphysema), coughing up mucus (Bronchitis), persistent dry cough, or temporary stoppage of breathing during sleep Neurological: No reported neurological signs or symptoms such as seizures, abnormal skin sensations, urinary and/or fecal incontinence, being born with an abnormal open spine and/or a tethered spinal cord Psychological-Psychiatric: Prone to panicking Gastrointestinal: Reflux or heatburn Genitourinary: Kidney disease Hematological: No reported hematological signs or symptoms such as prolonged bleeding, low or poor functioning platelets, bruising or bleeding easily, hereditary bleeding problems, low energy levels due to low hemoglobin or being anemic Endocrine: No reported endocrine signs or symptoms such as high or low blood sugar, rapid heart rate due to high thyroid levels, obesity  or weight gain due to slow thyroid or thyroid disease Rheumatologic: No reported rheumatological signs and symptoms such as fatigue, joint pain, tenderness, swelling, redness, heat, stiffness, decreased range of motion, with or without associated rash Musculoskeletal: Negative for myasthenia gravis, muscular dystrophy, multiple sclerosis or malignant hyperthermia Work History: Retired  Allergies  Ms. Ortez is allergic to citalopram; dilaudid [hydromorphone hcl]; hydrochlorothiazide; nsaids; nubain [nalbuphine hcl]; penicillins; prasugrel; and statins.  Laboratory Chemistry Profile   Renal Lab Results  Component Value Date   BUN 19 03/10/2020   CREATININE 1.21 (H) 03/10/2020   LABCREA 86 03/06/2018   GFR 51.74 (L) 12/21/2018   GFRAA 50 (L) 03/10/2020   GFRNONAA 43 (L) 03/10/2020   PROTEINUR  NEGATIVE 02/07/2017     Electrolytes Lab Results  Component Value Date   NA 137 03/10/2020   K 4.6 03/10/2020   CL 102 03/10/2020   CALCIUM 9.3 03/10/2020     Hepatic Lab Results  Component Value Date   AST 17 03/10/2020   ALT 13 03/10/2020   ALBUMIN 3.7 03/10/2020   ALKPHOS 62 03/10/2020   LIPASE 16.0 07/06/2017     ID No results found for: LYMEIGGIGMAB, HIV, SARSCOV2NAA, STAPHAUREUS, MRSAPCR, HCVAB, PREGTESTUR, RMSFIGG, QFVRPH1IGG, QFVRPH2IGG, LYMEIGGIGMAB   Bone No results found for: VD25OH, GM010UV2ZDG, [id number], UY4034VQ2, 25OHVITD1, 25OHVITD2, 25OHVITD3, TESTOFREE, TESTOSTERONE   Endocrine Lab Results  Component Value Date   GLUCOSE 104 (H) 03/10/2020   GLUCOSEU NEGATIVE 02/07/2017   HGBA1C 5.9 07/19/2016   TSH 4.46 03/06/2018     Neuropathy Lab Results  Component Value Date   VITAMINB12 580 07/31/2019   HGBA1C 5.9 07/19/2016     CNS No results found for: COLORCSF, APPEARCSF, RBCCOUNTCSF, WBCCSF, POLYSCSF, LYMPHSCSF, EOSCSF, PROTEINCSF, GLUCCSF, JCVIRUS, CSFOLI, IGGCSF, LABACHR, ACETBL, LABACHR, ACETBL   Inflammation (CRP: Acute  ESR: Chronic) Lab Results  Component Value Date   CRP 0.2 (L) 02/22/2018   ESRSEDRATE 24 02/22/2018     Rheumatology No results found for: RF, ANA, LABURIC, URICUR, LYMEIGGIGMAB, LYMEABIGMQN, HLAB27   Coagulation Lab Results  Component Value Date   INR 1.0 03/27/2014   LABPROT 174 (H) 03/06/2018   APTT 29.2 03/27/2014   PLT 236 03/10/2020     Cardiovascular Lab Results  Component Value Date   CKTOTAL 162 02/07/2017   CKMB 1.8 01/30/2012   TROPONINI 0.04 01/30/2012   HGB 10.8 (L) 03/10/2020   HCT 33.1 (L) 03/10/2020     Screening No results found for: SARSCOV2NAA, COVIDSOURCE, STAPHAUREUS, MRSAPCR, HCVAB, HIV, PREGTESTUR   Cancer No results found for: CEA, CA125, LABCA2   Allergens No results found for: ALMOND, APPLE, ASPARAGUS, AVOCADO, BANANA, BARLEY, BASIL, BAYLEAF, GREENBEAN, LIMABEAN, WHITEBEAN, BEEFIGE,  REDBEET, BLUEBERRY, BROCCOLI, CABBAGE, MELON, CARROT, CASEIN, CASHEWNUT, CAULIFLOWER, CELERY     Note: Lab results reviewed.   PFSH  Drug: Ms. Aguilera  reports no history of drug use. Alcohol:  reports no history of alcohol use. Tobacco:  reports that she quit smoking about 21 years ago. She has never used smokeless tobacco. 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 Family: family history includes Breast cancer (age of onset: 51) in her paternal aunt; Cerebral aneurysm in her son and son; Diabetes in her mother; Heart disease in her sister; Hypertension in her father and  mother; Multiple sclerosis in her daughter and son; Seizures in her son; Stroke in her mother.  Past Surgical History:  Procedure Laterality Date  . ABDOMINAL HYSTERECTOMY  1976  . CAROTID ARTERY ANGIOPLASTY Left   . CAROTID ENDARTERECTOMY Left   . CHOLECYSTECTOMY    . COLONOSCOPY WITH PROPOFOL N/A 08/16/2017   Procedure: COLONOSCOPY WITH PROPOFOL;  Surgeon: [name], MD;  Location: [facility];  Service: Endoscopy;  Laterality: N/A;  . CORONARY ANGIOPLASTY WITH STENT PLACEMENT  2000  . CORONARY ARTERY BYPASS GRAFT  2000  . CYSTOSCOPY WITH STENT PLACEMENT Bilateral   . ESOPHAGOGASTRODUODENOSCOPY (EGD) WITH PROPOFOL N/A 08/16/2017   Procedure: ESOPHAGOGASTRODUODENOSCOPY (EGD) WITH PROPOFOL;  Surgeon: [name], MD;  Location: [facility] ENDOSCOPY;  Service: Endoscopy;  Laterality: N/A;  . ESOPHAGOGASTRODUODENOSCOPY (EGD) WITH PROPOFOL N/A 06/29/2018   Procedure: ESOPHAGOGASTRODUODENOSCOPY (EGD) WITH PROPOFOL;  Surgeon: Virgel Manifold, MD;  Location: [facility] ENDOSCOPY;  Service: Endoscopy;   Laterality: N/A;  . JOINT REPLACEMENT Bilateral 2000  . RENAL ARTERY ANGIOPLASTY Bilateral mARCH 2015  . TOE AMPUTATION Right    small toe  . TONSILLECTOMY AND ADENOIDECTOMY    . TOTAL HIP ARTHROPLASTY Left   . TOTAL HIP ARTHROPLASTY Right 2015   Active Ambulatory Problems    Diagnosis Date Noted  . Hip pain, bilateral 12/27/2013  . White coat syndrome with hypertension 12/27/2013  . Need for vaccination with 13-polyvalent pneumococcal conjugate vaccine 12/28/2013  . HLD (hyperlipidemia) 06/24/2014  . Chronic narcotic use 06/24/2014  . Need for prophylactic vaccination and inoculation against influenza 06/24/2014  . 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 Peripheral vascular disease ([place]) 08/03/2014  . CAD (coronary artery disease) 08/03/2014  . Generalized anxiety disorder 09/08/2014  . Chronic epigastric pain 11/14/2014  . Chronic right hip pain 11/14/2014  . GERD (gastroesophageal reflux disease) 12/06/2014  . Statin intolerance 05/20/2015  . Chronic daily headache 07/20/2015  . Cervical spondylosis without myelopathy 08/20/2015  . Dizziness 12/04/2015  . Palpitations 06/01/2016  . Atherosclerosis of renal artery ([place]) 09/09/2016  . Carotid stenosis 09/09/2016  . Chronic breast pain 12/14/2016  . Aortic aneurysm, abdominal ([place]) 07/05/2017  . Aortic valve disorder 12/28/2013  . Chronic radicular lumbar pain 12/24/2014  . Lumbar stenosis with neurogenic claudication 12/24/2014  . Osteoarthritis of right hip 04/04/2014  . 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 Anemia 02/22/2018  . Stomach irritation   . Educated about COVID-19 virus infection 03/21/2019  . Swelling of right hand 04/22/2019  . Ankle swelling 04/22/2019  . Pain due to onychomycosis of toenails of both feet 06/18/2019  . Callus 06/18/2019  . Amputation of fifth toe, right, traumatic, subsequent encounter ([place]) 06/18/2019  . Thumb  pain, left 10/09/2019  . Chronic pain syndrome 03/27/2020  . Lumbar spondylosis 03/27/2020   Resolved Ambulatory Problems    Diagnosis Date Noted  . Lumbosacral radiculitis 12/27/2013  . Aortic systolic murmur on examination 12/28/2013  . Lumbago without sciatica 09/06/2014  . Dizziness and giddiness 05/20/2015  . Viral respiratory illness 11/26/2015  . Cervicalgia 12/04/2015  . Dizziness, psychogenic 02/13/2017  . Abdominal pain 04/12/2017  . Sinus congestion 11/06/2017  . Sinusitis 12/23/2018   Past Medical History:  Diagnosis Date  . Anemia of chronic kidney failure   . Anxiety   . Carotid artery stenosis   . Chronic kidney disease, stage III (moderate)   . Hyperlipidemia   . Hypertension   . Renal artery stenosis ([place])   .  Secondary hyperparathyroidism ([place])   . 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 In no apparent acute distress Vitals:   03/27/20 0850  BP: 136/81  Pulse: 70  Resp: 16  Temp: 97.8 F (36.6 C)  TempSrc: Temporal  SpO2: 99%  Weight: 162 lb (73.5 kg)  Height: '5\' 1"'$  (1.549 m)   BMI Assessment: Estimated body mass index is 30.61 kg/m as calculated from the following:   Height as of this encounter: '5\' 1"'$  (1.549 m).   Weight as of this encounter: 162 lb (73.5 kg).  BMI interpretation table: BMI level Category Range association with higher incidence of chronic pain  <18 kg/m2 Underweight   18.5-24.9 kg/m2 Ideal body weight   25-29.9 kg/m2 Overweight Increased incidence by 20%  30-34.9 kg/m2 Obese (Class I) Increased incidence by 68%  35-39.9 kg/m2 Severe obesity (Class II) Increased incidence by 136%  >40 kg/m2 Extreme obesity (Class III) Increased incidence by 254%   Patient's current BMI Ideal Body weight  Body mass index is 30.61 kg/m. Ideal body weight: 47.8 kg (105 lb 6.1 oz) Adjusted ideal  body weight: 58.1 kg (128 lb 0.5 oz)   BMI Readings from Last 4 Encounters:  03/27/20 30.61 kg/m  03/10/20 30.50 kg/m  12/31/19 30.61 kg/m  10/09/19 30.42 kg/m   Wt Readings from Last 4 Encounters:  03/27/20 162 lb (73.5 kg)  03/10/20 161 lb 6.4 oz (73.2 kg)  12/31/19 162 lb (73.5 kg)  10/09/19 161 lb (73 kg)    Psych/Mental status: Alert, oriented x 3 (person, place, & time)       Eyes: PERLA Respiratory: No evidence of acute respiratory distress  Cervical Spine Exam  Skin & Axial Inspection: No masses, redness, edema, swelling, or associated skin lesions Alignment: Symmetrical Functional ROM: Unrestricted ROM      Stability: No instability detected Muscle Tone/Strength: Functionally intact. No obvious neuro-muscular anomalies detected. Sensory (Neurological): Unimpaired Palpation: No palpable anomalies              Upper Extremity (UE) Exam    Side: Right upper extremity  Side: Left upper extremity  Skin & Extremity Inspection: Skin color, temperature, and hair growth are WNL. No peripheral edema or cyanosis. No masses, redness, swelling, asymmetry, or associated skin lesions. No contractures.  Skin & Extremity Inspection: Skin color, temperature, and hair growth are WNL. No peripheral edema or cyanosis. No masses, redness, swelling, asymmetry, or associated skin lesions. No contractures.  Functional ROM: Unrestricted ROM          Functional ROM: Unrestricted ROM          Muscle Tone/Strength: Functionally intact. No obvious neuro-muscular anomalies detected.  Muscle Tone/Strength: Functionally intact. No obvious neuro-muscular anomalies detected.  Sensory (Neurological): Unimpaired          Sensory (Neurological): Unimpaired          Palpation: No palpable anomalies              Palpation: No palpable anomalies              Provocative Test(s):  Phalen's test: deferred Tinel's test: deferred Apley's scratch test (touch opposite shoulder):  Action 1 (Across chest):  deferred Action 2 (Overhead): deferred Action 3 (LB reach): deferred   Provocative Test(s):  Phalen's test: deferred Tinel's test: deferred Apley's scratch test (touch opposite shoulder):  Action 1 (Across chest): deferred  Action 2 (Overhead): deferred Action 3 (LB reach): deferred    Thoracic Spine Area Exam  Skin & Axial Inspection: No masses, redness, or swelling Alignment: Symmetrical Functional ROM: Unrestricted ROM Stability: No instability detected Muscle Tone/Strength: Functionally intact. No obvious neuro-muscular anomalies detected. Sensory (Neurological): Unimpaired Muscle strength & Tone: No palpable anomalies  Lumbar Exam  Skin & Axial Inspection: No masses, redness, or swelling Alignment: Symmetrical Functional ROM: Decreased ROM affecting both sides Stability: No instability detected Muscle Tone/Strength: Functionally intact. No obvious neuro-muscular anomalies detected. Sensory (Neurological): Musculoskeletal pain pattern Palpation: No palpable anomalies       Provocative Tests: Hyperextension/rotation test: (+) bilaterally for facet joint pain. Lumbar quadrant test (Kemp's test): (+) bilaterally for facet joint pain. Patrick's Maneuver: deferred today                   FABER* test: deferred today                   S-I anterior distraction/compression test: (+)   S-I arthralgia/arthropathy S-I lateral compression test: deferred today         S-I Thigh-thrust test: deferred today         S-I Gaenslen's test: deferred today         *(Flexion, ABduction and External Rotation)  Gait & Posture Assessment  Ambulation: Limited Gait: Antalgic Posture: Difficulty standing up straight, due to pain   Lower Extremity Exam    Side: Right lower extremity  Side: Left lower extremity  Stability: No instability observed          Stability: No instability observed          Skin & Extremity Inspection: Evidence of prior arthroplastic surgery  Skin & Extremity Inspection:  Evidence of prior arthroplastic surgery  Functional ROM: Pain restricted ROM for all joints of the lower extremity          Functional ROM: Pain restricted ROM for all joints of the lower extremity          Muscle Tone/Strength: Functionally intact. No obvious neuro-muscular anomalies detected.  Muscle Tone/Strength: Functionally intact. No obvious neuro-muscular anomalies detected.  Sensory (Neurological): Musculoskeletal pain pattern        Sensory (Neurological): Musculoskeletal pain pattern        DTR: Patellar: deferred today Achilles: deferred today Plantar: deferred today  DTR: Patellar: deferred today Achilles: deferred today Plantar: deferred today  Palpation: No palpable anomalies  Palpation: No palpable anomalies   Assessment  Primary Diagnosis & Pertinent Problem List: The primary encounter diagnosis was Lumbar spondylosis. Diagnoses of Lumbar facet arthropathy, Lumbar radiculopathy, Chronic radicular lumbar pain, and Chronic pain syndrome were also pertinent to this visit.  Visit Diagnosis (New problems to examiner): 1. Lumbar spondylosis   2. Lumbar facet arthropathy   3. Lumbar radiculopathy   4. Chronic radicular lumbar pain   5. Chronic pain syndrome    Plan of Care (Initial workup plan)   79 year old female with a history of chronic low back pain with intermittent radiation into left lateral thigh left groin greater than right.  She has a history of bilateral hip replacements.  She also has an impressive cardiovascular history with coronary and renal stents in place currently on Plavix and aspirin.  She is status post S1 transforaminal epidural steroid injections x3 with Dr. Sharlet Salina which were not helpful.  She is on fentanyl patch and tramadol prescribed by her PCP for headaches.  She has tried gabapentin in the past which was  not helpful.  Given that the majority of her pain is low back and facet mediated, recommend diagnostic lumbar facet medial branch nerve blocks.   We also discussed possible spinal cord stimulator trial.  Spent a great deal of time discussing the details of this procedure.  Patient will need to stop her Plavix for at least 14 days, 7 days before the procedure in 7 days during the trial procedure.  This would subsequently require a surgical implant.  Patient wants to defer at this time and would like to consider diagnostic facet blocks and possible radiofrequency ablation.  She could also be a candidate for medial branch peripheral nerve stimulation.  Risks and benefits of diagnostic medial branch nerve block were reviewed with patient.  We will contact her cardiologist, Dr. Towanda Malkin to get clearance to stop her Plavix for 7 days prior to her diagnostic facet blocks.  Patient would like to proceed.  She is to continue medication management with her primary care provider.   Procedure Orders     LUMBAR FACET(MEDIAL BRANCH NERVE BLOCK) MBNB  Other analgesic(s): To be determined at a later time   Interventional management options: Ms. Grable was informed that there is no guarantee that she would be a candidate for interventional therapies. The decision will be based on the results of diagnostic studies, as well as Ms. Plagge's risk profile.  Procedure(s) under consideration:  Status post bilateral S1 transforaminal epidural steroid injections x3 with Dr. Sharlet Salina: Not helpful Consider diagnostic lumbar facet medial branch nerve block at L3, L4, L5 given MRI findings of significant facet arthrosis Consider sprint peripheral nerve stimulation of lumbar medial branch at L4-L5 Consider spinal cord stimulator trial: We will need to evaluate thoracic interlaminar windows.   Provider-requested follow-up: Return in about 3 weeks (around 04/17/2020) for B/L L3,4,5 Fcts with sedation (stop Plavix for 7 days).  Future Appointments  Date Time Provider [place]  04/07/2020  8:30 AM [facility] 2 [facility] None  04/07/2020  9:30 AM [facility] 2  [facility] None  04/07/2020 10:00 AM Schnier, [name], MD [facility] None  09/09/2020  1:00 PM [facility] None  09/09/2020  1:30 PM Earlie Server, MD [facility] None  10/02/2020  3:00 PM [facility] 1 [facility] None  10/02/2020  4:15 PM Schnier, [name], MD [facility] None    Note by: Gillis Santa, MD Date: 03/27/2020; Time: 9:59 AM

## 2020-03-27 NOTE — Patient Instructions (Addendum)
Please get cardiac clearance to stop Plavix PRIOR to scheduling diagnostic blocks.Pain Management Discharge Instructions  General Discharge Instructions :  If you need to reach your doctor call: Monday-Friday 8:00 am - 4:00 pm at [phone] or toll free [phone].  After clinic hours [phone] to have operator reach doctor.  Bring all of your medication bottles to all your appointments in the pain clinic.  To cancel or reschedule your appointment with Pain Management please remember to call 24 hours in advance to avoid a fee.  Refer to the educational materials which you have been given on: General Risks, I had my Procedure. Discharge Instructions, Post Sedation.  Post Procedure Instructions:  The drugs you were given will stay in your system until tomorrow, so for the next 24 hours you should not drive, make any legal decisions or drink any alcoholic beverages.  You may eat anything you prefer, but it is better to start with liquids then soups and crackers, and gradually work up to solid foods.  Please notify your doctor immediately if you have any unusual bleeding, trouble breathing or pain that is not related to your normal pain.  Depending on the type of procedure that was done, some parts of your body may feel week and/or numb.  This usually clears up by tonight or the next day.  Walk with the use of an assistive device or accompanied by an adult for the 24 hours.  You may use ice on the affected area for the first 24 hours.  Put ice in a Ziploc bag and cover with a towel and place against area 15 minutes on 15 minutes off.  You may switch to heat after 24 hours.GENERAL RISKS AND COMPLICATIONS  What are the risk, side effects and possible complications? Generally speaking, most procedures are safe.  However, with any procedure there are risks, side effects, and the possibility of complications.  The risks and complications are dependent upon the sites that are lesioned, or  the type of nerve block to be performed.  The closer the procedure is to the spine, the more serious the risks are.  Great care is taken when placing the radio frequency needles, block needles or lesioning probes, but sometimes complications can occur. 1. Infection: Any time there is an injection through the skin, there is a risk of infection.  This is why sterile conditions are used for these blocks.  There are four possible types of infection. 1. Localized skin infection. 2. Central Nervous System Infection-This can be in the form of Meningitis, which can be deadly. 3. Epidural Infections-This can be in the form of an epidural abscess, which can cause pressure inside of the spine, causing compression of the spinal cord with subsequent paralysis. This would require an emergency surgery to decompress, and there are no guarantees that the patient would recover from the paralysis. 4. Discitis-This is an infection of the intervertebral discs.  It occurs in about 1% of discography procedures.  It is difficult to treat and it may lead to surgery.        2. Pain: the needles have to go through skin and soft tissues, will cause soreness.       3. Damage to internal structures:  The nerves to be lesioned may be near blood vessels or    other nerves which can be potentially damaged.       4. Bleeding: Bleeding is more common if the patient is taking blood thinners such as  aspirin, Coumadin, Ticiid, Plavix,  etc., or if he/she have some genetic predisposition  such as hemophilia. Bleeding into the spinal canal can cause compression of the spinal  cord with subsequent paralysis.  This would require an emergency surgery to  decompress and there are no guarantees that the patient would recover from the  paralysis.       5. Pneumothorax:  Puncturing of a lung is a possibility, every time a needle is introduced in  the area of the chest or upper back.  Pneumothorax refers to free air around the  collapsed lung(s),  inside of the thoracic cavity (chest cavity).  Another two possible  complications related to a similar event would include: Hemothorax and Chylothorax.   These are variations of the Pneumothorax, where instead of air around the collapsed  lung(s), you may have blood or chyle, respectively.       6. Spinal headaches: They may occur with any procedures in the area of the spine.       7. Persistent CSF (Cerebro-Spinal Fluid) leakage: This is a rare problem, but may occur  with prolonged intrathecal or epidural catheters either due to the formation of a fistulous  track or a dural tear.       8. Nerve damage: By working so close to the spinal cord, there is always a possibility of  nerve damage, which could be as serious as a permanent spinal cord injury with  paralysis.       9. Death:  Although rare, severe deadly allergic reactions known as "Anaphylactic  reaction" can occur to any of the medications used.      10. Worsening of the symptoms:  We can always make thing worse.  What are the chances of something like this happening? Chances of any of this occuring are extremely low.  By statistics, you have more of a chance of getting killed in a motor vehicle accident: while driving to the hospital than any of the above occurring .  Nevertheless, you should be aware that they are possibilities.  In general, it is similar to taking a shower.  Everybody knows that you can slip, hit your head and get killed.  Does that mean that you should not shower again?  Nevertheless always keep in mind that statistics do not mean anything if you happen to be on the wrong side of them.  Even if a procedure has a 1 (one) in a 1,000,000 (million) chance of going wrong, it you happen to be that one..Also, keep in mind that by statistics, you have more of a chance of having something go wrong when taking medications.  Who should not have this procedure? If you are on a blood thinning medication (e.g. Coumadin, Plavix, see list  of "Blood Thinners"), or if you have an active infection going on, you should not have the procedure.  If you are taking any blood thinners, please inform your physician.  How should I prepare for this procedure?  Do not eat or drink anything at least six hours prior to the procedure.  Bring a driver with you .  It cannot be a taxi.  Come accompanied by an adult that can drive you back, and that is strong enough to help you if your legs get weak or numb from the local anesthetic.  Take all of your medicines the morning of the procedure with just enough water to swallow them.  If you have diabetes, make sure that you are scheduled to have your procedure done first  thing in the morning, whenever possible.  If you have diabetes, take only half of your insulin dose and notify our nurse that you have done so as soon as you arrive at the clinic.  If you are diabetic, but only take blood sugar pills (oral hypoglycemic), then do not take them on the morning of your procedure.  You may take them after you have had the procedure.  Do not take aspirin or any aspirin-containing medications, at least eleven (11) days prior to the procedure.  They may prolong bleeding.  Wear loose fitting clothing that may be easy to take off and that you would not mind if it got stained with Betadine or blood.  Do not wear any jewelry or perfume  Remove any nail coloring.  It will interfere with some of our monitoring equipment.  NOTE: Remember that this is not meant to be interpreted as a complete list of all possible complications.  Unforeseen problems may occur.  BLOOD THINNERS The following drugs contain aspirin or other products, which can cause increased bleeding during surgery and should not be taken for 2 weeks prior to and 1 week after surgery.  If you should need take something for relief of minor pain, you may take acetaminophen which is found in Tylenol,m Datril, Anacin-3 and Panadol. It is not blood  thinner. The products listed below are.  Do not take any of the products listed below in addition to any listed on your instruction sheet.  A.P.C or A.P.C with Codeine Codeine Phosphate Capsules #3 Ibuprofen Ridaura  ABC compound Congesprin Imuran rimadil  Advil Cope Indocin Robaxisal  Alka-Seltzer Effervescent Pain Reliever and Antacid Coricidin or Coricidin-D  Indomethacin Rufen  Alka-Seltzer plus Cold Medicine Cosprin Ketoprofen S-A-C Tablets  Anacin Analgesic Tablets or Capsules Coumadin Korlgesic Salflex  Anacin Extra Strength Analgesic tablets or capsules CP-2 Tablets Lanoril Salicylate  Anaprox Cuprimine Capsules Levenox Salocol  Anexsia-D Dalteparin Magan Salsalate  Anodynos Darvon compound Magnesium Salicylate Sine-off  Ansaid Dasin Capsules Magsal Sodium Salicylate  Anturane Depen Capsules Marnal Soma  APF Arthritis pain formula Dewitt's Pills Measurin Stanback  Argesic Dia-Gesic Meclofenamic Sulfinpyrazone  Arthritis Bayer Timed Release Aspirin Diclofenac Meclomen Sulindac  Arthritis pain formula Anacin Dicumarol Medipren Supac  Analgesic (Safety coated) Arthralgen Diffunasal Mefanamic Suprofen  Arthritis Strength Bufferin Dihydrocodeine Mepro Compound Suprol  Arthropan liquid Dopirydamole Methcarbomol with Aspirin Synalgos  ASA tablets/Enseals Disalcid Micrainin Tagament  Ascriptin Doan's Midol Talwin  Ascriptin A/D Dolene Mobidin Tanderil  Ascriptin Extra Strength Dolobid Moblgesic Ticlid  Ascriptin with Codeine Doloprin or Doloprin with Codeine Momentum Tolectin  Asperbuf Duoprin Mono-gesic Trendar  Aspergum Duradyne Motrin or Motrin IB Triminicin  Aspirin plain, buffered or enteric coated Durasal Myochrisine Trigesic  Aspirin Suppositories Easprin Nalfon Trillsate  Aspirin with Codeine Ecotrin Regular or Extra Strength Naprosyn Uracel  Atromid-S Efficin Naproxen Ursinus  Auranofin Capsules Elmiron Neocylate Vanquish  Axotal Emagrin Norgesic Verin  Azathioprine  Empirin or Empirin with Codeine Normiflo Vitamin E  Azolid Emprazil Nuprin Voltaren  Bayer Aspirin plain, buffered or children's or timed BC Tablets or powders Encaprin Orgaran Warfarin Sodium  Buff-a-Comp Enoxaparin Orudis Zorpin  Buff-a-Comp with Codeine Equegesic Os-Cal-Gesic   Buffaprin Excedrin plain, buffered or Extra Strength Oxalid   Bufferin Arthritis Strength Feldene Oxphenbutazone   Bufferin plain or Extra Strength Feldene Capsules Oxycodone with Aspirin   Bufferin with Codeine Fenoprofen Fenoprofen Pabalate or Pabalate-SF   Buffets II Flogesic Panagesic   Buffinol plain or Extra Strength Florinal or Florinal with Codeine Panwarfarin  Buf-Tabs Flurbiprofen Penicillamine   Butalbital Compound Four-way cold tablets Penicillin   Butazolidin Fragmin Pepto-Bismol   Carbenicillin Geminisyn Percodan   Carna Arthritis Reliever Geopen Persantine   Carprofen Gold's salt Persistin   Chloramphenicol Goody's Phenylbutazone   Chloromycetin Haltrain Piroxlcam   Clmetidine heparin Plaquenil   Cllnoril Hyco-pap Ponstel   Clofibrate Hydroxy chloroquine Propoxyphen         Before stopping any of these medications, be sure to consult the physician who ordered them.  Some, such as Coumadin (Warfarin) are ordered to prevent or treat serious conditions such as "deep thrombosis", "pumonary embolisms", and other heart problems.  The amount of time that you may need off of the medication may also vary with the medication and the reason for which you were taking it.  If you are taking any of these medications, please make sure you notify your pain physician before you undergo any procedures.         Facet Blocks Patient Information  Description: The facets are joints in the spine between the vertebrae.  Like any joints in the body, facets can become irritated and painful.  Arthritis can also effect the facets.  By injecting steroids and local anesthetic in and around these joints, we can  temporarily block the nerve supply to them.  Steroids act directly on irritated nerves and tissues to reduce selling and inflammation which often leads to decreased pain.  Facet blocks may be done anywhere along the spine from the neck to the low back depending upon the location of your pain.   After numbing the skin with local anesthetic (like Novocaine), a small needle is passed onto the facet joints under x-ray guidance.  You may experience a sensation of pressure while this is being done.  The entire block usually lasts about 15-25 minutes.   Conditions which may be treated by facet blocks:   Low back/buttock pain  Neck/shoulder pain  Certain types of headaches  Preparation for the injection:  1. Do not eat any solid food or dairy products within 8 hours of your appointment. 2. You may drink clear liquid up to 3 hours before appointment.  Clear liquids include water, black coffee, juice or soda.  No milk or cream please. 3. You may take your regular medication, including pain medications, with a sip of water before your appointment.  Diabetics should hold regular insulin (if taken separately) and take 1/2 normal NPH dose the morning of the procedure.  Carry some sugar containing items with you to your appointment. 4. A driver must accompany you and be prepared to drive you home after your procedure. 5. Bring all your current medications with you. 6. An IV may be inserted and sedation may be given at the discretion of the physician. 7. A blood pressure cuff, EKG and other monitors will often be applied during the procedure.  Some patients may need to have extra oxygen administered for a short period. 8. You will be asked to provide medical information, including your allergies and medications, prior to the procedure.  We must know immediately if you are taking blood thinners (like Coumadin/Warfarin) or if you are allergic to IV iodine contrast (dye).  We must know if you could possible be  pregnant.  Possible side-effects:   Bleeding from needle site  Infection (rare, may require surgery)  Nerve injury (rare)  Numbness & tingling (temporary)  Difficulty urinating (rare, temporary)  Spinal headache (a headache worse with upright posture)  Light-headedness (temporary)  Pain  at injection site (serveral days)  Decreased blood pressure (rare, temporary)  Weakness in arm/leg (temporary)  Pressure sensation in back/neck (temporary)   Call if you experience:   Fever/chills associated with headache or increased back/neck pain  Headache worsened by an upright position  New onset, weakness or numbness of an extremity below the injection site  Hives or difficulty breathing (go to the emergency room)  Inflammation or drainage at the injection site(s)  Severe back/neck pain greater than usual  New symptoms which are concerning to you  Please note:  Although the local anesthetic injected can often make your back or neck feel good for several hours after the injection, the pain will likely return. It takes 3-7 days for steroids to work.  You may not notice any pain relief for at least one week.  If effective, we will often do a series of 2-3 injections spaced 3-6 weeks apart to maximally decrease your pain.  After the initial series, you may be a candidate for a more permanent nerve block of the facets.  If you have any questions, please call #[phone]) [facility]

## 2020-04-01 ENCOUNTER — Telehealth: Payer: Self-pay | Admitting: *Deleted

## 2020-04-01 NOTE — Telephone Encounter (Signed)
Received clearance from Dr. Clayborn Bigness to stop PLAVIX for 7 days prior to procedure. Patient called and given appointment, pre-procedure instructions and told to stop her PLAVIX on 04/02/2020 for procedure on 04/09/2020.

## 2020-04-02 ENCOUNTER — Telehealth: Payer: Self-pay | Admitting: Student in an Organized Health Care Education/Training Program

## 2020-04-02 NOTE — Telephone Encounter (Signed)
Pt asking the time and date of her next appt. I informed her of this.

## 2020-04-02 NOTE — Telephone Encounter (Signed)
Patient would like nurse to call for question about her plavix and upcoming procedure appt.

## 2020-04-03 ENCOUNTER — Telehealth: Payer: Self-pay | Admitting: Internal Medicine

## 2020-04-03 NOTE — Telephone Encounter (Signed)
Tried to reach patient to discuss overdue cologuard/colonoscopy no answer and voicemail was full.

## 2020-04-03 NOTE — Telephone Encounter (Signed)
She is taking aspirin 81 mg and plavix 75mg .  Both need to  Be stopped prior to her procedure . If he hasn' t told her when to stop them,  Stop them TODAY

## 2020-04-03 NOTE — Telephone Encounter (Signed)
The patient is having back surgery on 6.16.2021 by Dr. Eduard Clos. She needs to know the medications she is taking that has Asprin. Please call the patient.

## 2020-04-03 NOTE — Telephone Encounter (Signed)
Spoke with pt to let her know that she needs to stop the Aspirin and the Plavix. Pt stated that she stopped them last night.

## 2020-04-04 ENCOUNTER — Telehealth: Payer: Self-pay

## 2020-04-04 ENCOUNTER — Other Ambulatory Visit (INDEPENDENT_AMBULATORY_CARE_PROVIDER_SITE_OTHER): Payer: Self-pay | Admitting: Nurse Practitioner

## 2020-04-04 DIAGNOSIS — I739 Peripheral vascular disease, unspecified: Secondary | ICD-10-CM

## 2020-04-04 NOTE — Telephone Encounter (Signed)
Received message asking if patient needed to stop her Aspirin 81 mg.  Patient notified that she did not need to stop her aspirin 81 mg.

## 2020-04-07 ENCOUNTER — Ambulatory Visit (INDEPENDENT_AMBULATORY_CARE_PROVIDER_SITE_OTHER): Payer: Medicare Other

## 2020-04-07 ENCOUNTER — Ambulatory Visit (INDEPENDENT_AMBULATORY_CARE_PROVIDER_SITE_OTHER): Payer: Medicare Other | Admitting: Vascular Surgery

## 2020-04-07 ENCOUNTER — Other Ambulatory Visit: Payer: Self-pay

## 2020-04-07 ENCOUNTER — Encounter (INDEPENDENT_AMBULATORY_CARE_PROVIDER_SITE_OTHER): Payer: Self-pay | Admitting: Vascular Surgery

## 2020-04-07 VITALS — BP 120/55 | HR 80 | Ht 61.0 in | Wt 163.0 lb

## 2020-04-07 DIAGNOSIS — I714 Abdominal aortic aneurysm, without rupture, unspecified: Secondary | ICD-10-CM

## 2020-04-07 DIAGNOSIS — I2510 Atherosclerotic heart disease of native coronary artery without angina pectoris: Secondary | ICD-10-CM

## 2020-04-07 DIAGNOSIS — I6523 Occlusion and stenosis of bilateral carotid arteries: Secondary | ICD-10-CM | POA: Diagnosis not present

## 2020-04-07 DIAGNOSIS — K551 Chronic vascular disorders of intestine: Secondary | ICD-10-CM | POA: Diagnosis not present

## 2020-04-07 DIAGNOSIS — I739 Peripheral vascular disease, unspecified: Secondary | ICD-10-CM

## 2020-04-07 DIAGNOSIS — E782 Mixed hyperlipidemia: Secondary | ICD-10-CM | POA: Diagnosis not present

## 2020-04-08 ENCOUNTER — Encounter (INDEPENDENT_AMBULATORY_CARE_PROVIDER_SITE_OTHER): Payer: Self-pay | Admitting: Vascular Surgery

## 2020-04-08 NOTE — Progress Notes (Signed)
MRN : [id number]  Sabrina [name] is a 79 y.o. ([birth year]) female who presents with chief complaint of  Chief Complaint  Patient presents with  . Follow-up    U/S follow up  .  History of Present Illness:  The patient is seen for follow up evaluation of carotid stenosis. The carotid stenosis followed by ultrasound.   The patient denies amaurosis fugax. There is no recent history of TIA symptoms or focal motor deficits. There is no prior documented CVA.  The patient is taking enteric-coated aspirin 81 mg daily.  There is no history of migraine headaches. There is no history of seizures.  The patient isalso followedfor evaluation of painful lower extremities. Patient notes the pain is variable and not always associated with activity. Both legs are the same.The pain is somewhat consistent day to day occurring on most days. The patient notes the pain also occurs with standing and routinely seems worse as the day wears on. The pain has been stable over the pastl year. The patient states these symptoms are not causing a negative impact on quality of life and daily activities.  The patient denies rest pain or dangling of an extremity off the side of the bed during the night for relief. No open wounds or sores at this time. No history of DVT or phlebitis.  There is a history of back problems and DJD of the lumbar and sacral spine.   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  ABIs right equals 0.68 and left equals 0.66 (previous ABI right equal 0.58 and left equals 0.51)   Current Meds  Medication Sig  . aspirin 81 MG tablet Take 81 mg by mouth daily.  . chlordiazePOXIDE (LIBRIUM) 25 MG capsule TAKE ONE CAPSULE BY MOUTH ONCE DAILY AS NEEDED FOR ANXIETY  . clopidogrel (PLAVIX) 75 MG tablet Take 1 tablet (75 mg total) by mouth daily.  . fentaNYL (DURAGESIC) 50 MCG/HR Place 1 patch onto the skin every 3 (three) days.   Derrill Memo ON 04/09/2020] fentaNYL (DURAGESIC) 50 MCG/HR Place 1 patch onto the skin every 3 (three) days.  . ferrous sulfate (FEROSUL) 325 (65 FE) MG tablet Take 1 tablet (325 mg total) by mouth daily with breakfast.  . hydrALAZINE (APRESOLINE) 50 MG tablet TAKE 1 TABLET BY MOUTH THREE TIMES DAILY AS NEEDED FOR BLOOD PRESSURE GREATER THAN 150  . labetalol (NORMODYNE) 200 MG tablet Take 1 tablet (200 mg total) by mouth 2 (two) times daily.  Marland Kitchen lisinopril (ZESTRIL) 20 MG tablet Take 1 tablet (20 mg total) by mouth 2 (two) times daily.  Marland Kitchen NARCAN 4 MG/0.1ML LIQD nasal spray kit   . nortriptyline (PAMELOR) 10 MG capsule Take 1 capsule (10 mg total) by mouth 2 (two) times daily.  . Omega 3 1000 MG CAPS Take 1 capsule by mouth 2 (two) times daily.   . pantoprazole (PROTONIX) 40 MG tablet Take 1 tablet (40 mg total) by mouth daily. On an empty stomach  For gastritis  . traMADol (ULTRAM) 50 MG tablet TAKE 2 TABLETS BY MOUTH EVERY 12 HOURS AS NEEDED. MAX OF 4 TABLETS DAILY.  Marland Kitchen ZETIA 10 MG tablet Take 1 tablet (10 mg total) by mouth daily.    Past Medical History:  Diagnosis Date  . Anemia of chronic kidney failure   . Anxiety   . CAD (coronary artery disease)   . Carotid artery stenosis   . Chronic kidney disease, stage III (moderate)  Followed by Dr. Juleen China  . Hyperlipidemia   . Hypertension   . Renal artery stenosis ([place])   . Secondary hyperparathyroidism ([place])   . 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 Subclavian arterial stenosis [facility])     Past Surgical History:  Procedure Laterality Date  . ABDOMINAL HYSTERECTOMY  1976  . CAROTID ARTERY ANGIOPLASTY Left   . CAROTID ENDARTERECTOMY Left   . CHOLECYSTECTOMY    . COLONOSCOPY WITH PROPOFOL N/A 08/16/2017   Procedure: COLONOSCOPY WITH PROPOFOL;  Surgeon: [name], MD;  Location: [facility] ENDOSCOPY;  Service: Endoscopy;  Laterality: N/A;  . CORONARY ANGIOPLASTY WITH STENT PLACEMENT  2000  . CORONARY ARTERY BYPASS GRAFT   2000  . CYSTOSCOPY WITH STENT PLACEMENT Bilateral   . ESOPHAGOGASTRODUODENOSCOPY (EGD) WITH PROPOFOL N/A 08/16/2017   Procedure: ESOPHAGOGASTRODUODENOSCOPY (EGD) WITH PROPOFOL;  Surgeon: [name], MD;  Location: [facility] ENDOSCOPY;  Service: Endoscopy;  Laterality: N/A;  . ESOPHAGOGASTRODUODENOSCOPY (EGD) WITH PROPOFOL N/A 06/29/2018   Procedure: ESOPHAGOGASTRODUODENOSCOPY (EGD) WITH PROPOFOL;  Surgeon: Virgel Manifold, MD;  Location: [facility] ENDOSCOPY;  Service: Endoscopy;  Laterality: N/A;  . JOINT REPLACEMENT Bilateral 2000  . RENAL ARTERY ANGIOPLASTY Bilateral mARCH 2015  . TOE AMPUTATION Right    small toe  . TONSILLECTOMY AND ADENOIDECTOMY    . TOTAL HIP ARTHROPLASTY Left   . TOTAL HIP ARTHROPLASTY Right 2015    Social History Social History   Tobacco Use  . Smoking status: Former Smoker    Quit date: 10/25/1998    Years since quitting: 21.4  . Smokeless tobacco: Never Used  Vaping Use  . Vaping Use: Never used  Substance Use Topics  . Alcohol use: No    Alcohol/week: 0.0 standard drinks  . Drug use: Never    Family History Family History  Problem Relation Age of Onset  . Stroke Mother   . Hypertension Mother   . Diabetes Mother   . Hypertension Father   . Heart disease Sister        MI  . Multiple sclerosis Daughter   . Multiple sclerosis Son   . Cerebral aneurysm Son   . Seizures Son   . Cerebral aneurysm Son   . Breast cancer Paternal Aunt 65    Allergies  Allergen Reactions  . Citalopram     Throat closing   . Dilaudid [Hydromorphone Hcl] Nausea And Vomiting  . Hydrochlorothiazide Other (See Comments)    Decreased GFR (Nov 2015)  . Nsaids     CKD stage III - avoid nephrotoxic drugs  . Nubain [Nalbuphine Hcl]     Burning sensation in back  . Penicillins Itching  . Prasugrel Itching  . Statins Itching     REVIEW OF SYSTEMS (Negative unless checked)  Constitutional: _0 Weight loss  _1 Fever  _2 Chills Cardiac: _3 Chest pain   _4 Chest pressure    _5 Palpitations   _6 Shortness of breath when laying flat   _7 Shortness of breath with exertion. Vascular:  _8 Pain in legs with walking   _9 Pain in legs at rest  _10 History of DVT   _11 Phlebitis   _12 Swelling in legs   _13 Varicose veins   _14 Non-healing ulcers Pulmonary:   _15 Uses home oxygen   _16 Productive cough   _17 Hemoptysis   _18 Wheeze  _19 COPD   _20 Asthma Neurologic:  _21 Dizziness   _22 Seizures   _23 History of stroke   _24 History of TIA  _25 Aphasia   _26 Vissual changes   _27 Weakness or numbness in arm   _28 Weakness or numbness in leg Musculoskeletal:   _29   Joint swelling   _0 Joint pain   _1 Low back pain Hematologic:  _2 Easy bruising  _3 Easy bleeding   _4 Hypercoagulable state   _5 Anemic Gastrointestinal:  _6 Diarrhea   _7 Vomiting  _8 Gastroesophageal reflux/heartburn   _9 Difficulty swallowing. Genitourinary:  _10 Chronic kidney disease   _11 Difficult urination  _12 Frequent urination   _13 Blood in urine Skin:  _14 Rashes   _15 Ulcers  Psychological:  _16 History of anxiety   _17  History of major depression.  Physical Examination  Vitals:   04/07/20 0940  BP: (!) 120/55  Pulse: 80  Weight: 163 lb (73.9 kg)  Height: _18  (1.549 m)   Body mass index is 30.8 kg/m. Gen: WD/WN, NAD Head: [place]/AT, No temporalis wasting.  Ear/Nose/Throat: Hearing grossly intact, nares w/o erythema or drainage Eyes: PER, EOMI, sclera nonicteric.  Neck: Supple, no large masses.   Pulmonary:  Good air movement, no audible wheezing bilaterally, no use of accessory muscles.  Cardiac: RRR, no JVD Vascular:  Vessel Right Left  Radial Palpable Palpable  PT Not Palpable Not Palpable  DP Not Palpable Not Palpable  Gastrointestinal: Non-distended. No guarding/no peritoneal signs.  Musculoskeletal: M/S 5/5 throughout.  No deformity or atrophy.  Neurologic: CN 2-12 intact. Symmetrical.  Speech is fluent. Motor exam as listed above. Psychiatric: Judgment intact, Mood & affect appropriate for pt's clinical situation. Dermatologic: No rashes or  ulcers noted.  No changes consistent with cellulitis.   CBC Lab Results  Component Value Date   WBC 5.2 03/10/2020   HGB 10.8 (L) 03/10/2020   HCT 33.1 (L) 03/10/2020   MCV 89.2 03/10/2020   PLT 236 03/10/2020    BMET    Component Value Date/Time   NA 137 03/10/2020 1311   NA 139 09/13/2014 0000   NA 134 (L) 04/12/2014 0558   K 4.6 03/10/2020 1311   K 4.4 04/12/2014 0558   CL 102 03/10/2020 1311   CL 101 04/12/2014 0558   CO2 28 03/10/2020 1311   CO2 27 04/12/2014 0558   GLUCOSE 104 (H) 03/10/2020 1311   GLUCOSE 90 04/12/2014 0558   BUN 19 03/10/2020 1311   BUN 18 09/13/2014 0000   BUN 16 04/12/2014 0558   CREATININE 1.21 (H) 03/10/2020 1311   CREATININE 1.12 04/12/2014 0558   CALCIUM 9.3 03/10/2020 1311   CALCIUM 8.5 04/12/2014 0558   GFRNONAA 43 (L) 03/10/2020 1311   GFRNONAA 49 (L) 04/12/2014 0558   GFRAA 50 (L) 03/10/2020 1311   GFRAA 57 (L) 04/12/2014 0558   CrCl cannot be calculated (Patient's most recent lab result is older than the maximum 21 days allowed.).  COAG Lab Results  Component Value Date   INR 1.0 03/27/2014    Radiology VAS [place] ABI WITH/WO TBI  Result Date: 04/07/2020 LOWER EXTREMITY DOPPLER STUDY Indications: Claudication, rest pain, and peripheral artery disease.  Comparison Study: 10/03/2019 Performing Technologist: Almira Coaster RVS  Examination Guidelines: A complete evaluation includes at minimum, Doppler waveform signals and systolic blood pressure reading at the level of bilateral brachial, anterior tibial, and posterior tibial arteries, when vessel segments are accessible. Bilateral testing is considered an integral part of a complete examination. Photoelectric Plethysmograph (PPG) waveforms and toe systolic pressure readings are included as required and additional duplex testing as needed. Limited examinations for reoccurring indications may be performed as noted.  ABI Findings: +---------+------------------+-----+----------+--------+  Right    Rt Pressure (mmHg)IndexWaveform  Comment  +---------+------------------+-----+----------+--------+ Brachial 183                                       +---------+------------------+-----+----------+--------+  ATA      124               0.68 monophasic         +---------+------------------+-----+----------+--------+ PTA      108               0.59 monophasic         +---------+------------------+-----+----------+--------+ Great Toe85                0.46 Abnormal           +---------+------------------+-----+----------+--------+ +---------+------------------+-----+----------+-------+ Left     Lt Pressure (mmHg)IndexWaveform  Comment +---------+------------------+-----+----------+-------+ Brachial 176                                      +---------+------------------+-----+----------+-------+ ATA      120               0.66 monophasic        +---------+------------------+-----+----------+-------+ PTA      112               0.61 monophasic        +---------+------------------+-----+----------+-------+ Great Toe78                0.43 Abnormal          +---------+------------------+-----+----------+-------+ +-------+-----------+-----------+------------+------------+ ABI/TBIToday's ABIToday's TBIPrevious ABIPrevious TBI +-------+-----------+-----------+------------+------------+ Right  .68        .46        .58         .40          +-------+-----------+-----------+------------+------------+ Left   .66        .43        .51         .41          +-------+-----------+-----------+------------+------------+ Bilateral ABIs appear increased compared to prior study on 10/03/2019. Bilateral TBIs appear essentially unchanged compared to prior study on 10/03/2019.  Summary: Right: Resting right ankle-brachial index indicates moderate right lower extremity arterial disease. The right toe-brachial index is abnormal. Left: Resting left ankle-brachial  index indicates moderate left lower extremity arterial disease. The left toe-brachial index is abnormal.  *See table(s) above for measurements and observations.  Electronically signed by Hortencia Pilar MD on 04/07/2020 at 5:20:03 PM.    Final    VAS US AORTA/IVC/ILIACS  Result Date: 04/07/2020 ABDOMINAL AORTA STUDY Indications: Follow up exam for known AAA.  Comparison Study: 10/03/2019 Performing Technologist: Almira Coaster RVS  Examination Guidelines: A complete evaluation includes B-mode imaging, spectral Doppler, color Doppler, and power Doppler as needed of all accessible portions of each vessel. Bilateral testing is considered an integral part of a complete examination. Limited examinations for reoccurring indications may be performed as noted.  Abdominal Aorta Findings: +-------------+-------+----------+----------+----------+--------+--------+ Location     AP (cm)Trans (cm)PSV (cm/s)Waveform  ThrombusComments +-------------+-------+----------+----------+----------+--------+--------+ Proximal     2.08   2.06      67        monophasic                 +-------------+-------+----------+----------+----------+--------+--------+ Mid          2.27   2.18      63        monophasic                 +-------------+-------+----------+----------+----------+--------+--------+ Distal       2.62   2.52      82  monophasic                 +-------------+-------+----------+----------+----------+--------+--------+ RT CIA Prox  1.7    1.9       133       monophasic                 +-------------+-------+----------+----------+----------+--------+--------+ RT CIA Distal1.6    1.6       121       monophasic                 +-------------+-------+----------+----------+----------+--------+--------+ RT EIA Prox  1.0    0.8       227       monophasic                 +-------------+-------+----------+----------+----------+--------+--------+ RT EIA Distal0.8    0.7        214       monophasic                 +-------------+-------+----------+----------+----------+--------+--------+ LT CIA Prox  1.2    1.4       130       monophasic                 +-------------+-------+----------+----------+----------+--------+--------+ LT CIA Distal0.9    0.9       154       monophasic                 +-------------+-------+----------+----------+----------+--------+--------+ LT EIA Prox  0.9    0.8       154       monophasic                 +-------------+-------+----------+----------+----------+--------+--------+ LT EIA Distal1.0    0.9       300       monophasic                 +-------------+-------+----------+----------+----------+--------+--------+  Summary: Abdominal Aorta: There is evidence of abnormal dilatation of the proximal, mid and distal Abdominal aorta. There is evidence of abnormal dilation of the Right Common Iliac artery. The largest aortic measurement is 2.6 cm. The largest aortic diameter has decreased compared to prior exam. Previous diameter measurement was 2.9 cm obtained on 10/03/2019.  *See table(s) above for measurements and observations.  Electronically signed by Hortencia Pilar MD on 04/07/2020 at 5:20:06 PM.    Final      Assessment/Plan 1. Peripheral vascular disease ([place]) Recommend:  The patient has experienced severe symptoms of pain in the hips and buttock and is now describing lifestyle limiting claudication.  Given the severity of the patient's lower extremity symptoms I have discussed angiography and intervention.  Risk and benefits were reviewed the patient.  Indications for the procedure were reviewed.  All questions were answered, the patient wishes to consider this but does not agree to proceed at this time.   The patient should continue walking and begin a more formal exercise program.  The patient should continue antiplatelet therapy and aggressive treatment of the lipid abnormalities  The patient  will follow up with me in one year if she chooses not to move forward with angiogram .  - VAS [place] ABI WITH/WO TBI; Future  2. Abdominal aortic aneurysm (AAA) without rupture ([place]) No surgery or intervention at this time. The patient has an asymptomatic abdominal aortic aneurysm that is less than 4 cm in maximal diameter.  I have discussed the natural history of abdominal aortic aneurysm and the small  risk of rupture for aneurysm less than 5 cm in size.  However, as these small aneurysms tend to enlarge over time, continued surveillance with ultrasound or CT scan is mandatory.  I have also discussed optimizing medical management with hypertension and lipid control and the importance of abstinence from tobacco.  The patient is also encouraged to exercise a minimum of 30 minutes 4 times a week.  Should the patient develop new onset abdominal or back pain or signs of peripheral embolization they are instructed to seek medical attention immediately and to alert the physician providing care that they have an aneurysm.  The patient voices their understanding. The patient will return in 12 months with an aortic duplex.   3. Bilateral carotid artery stenosis Recommend:  Given the patient's asymptomatic subcritical stenosis no further invasive testing or surgery at this time.  Duplex ultrasound shows chronic occlusion of the RICA and 40-59% stenosis of the LICA.  Continue antiplatelet therapy as prescribed Continue management of CAD, HTN and Hyperlipidemia Healthy heart diet,  encouraged exercise at least 4 times per week  Follow up in 12 months with duplex ultrasound and physical exam   - VAS US CAROTID; Future  4. 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[facility]) Recommend:  The patient has evidence of chronic asymptomatic mesenteric atherosclerosis.  The patient denies lifestyle limiting changes at this point in time.  Given the lack of symptoms no intervention is warranted at this time.   No further  invasive studies, angiography or surgery at this time  The patient should continue walking and begin a more formal exercise program.   The patient should continue antiplatelet therapy and aggressive treatment of the lipid abnormalities  Patient should undergo noninvasive studies as ordered. The patient will follow up with me after the studies.   5. Coronary artery disease involving native coronary artery of native heart without angina pectoris Continue cardiac and antihypertensive medications as already ordered and reviewed, no changes at this time.  Continue statin as ordered and reviewed, no changes at this time  Nitrates PRN for chest pain   6. Mixed hyperlipidemia Continue statin as ordered and reviewed, no changes at this time     Hortencia Pilar, MD  04/08/2020 10:26 AM

## 2020-04-09 ENCOUNTER — Ambulatory Visit (HOSPITAL_BASED_OUTPATIENT_CLINIC_OR_DEPARTMENT_OTHER): Payer: Medicare Other | Admitting: Student in an Organized Health Care Education/Training Program

## 2020-04-09 ENCOUNTER — Other Ambulatory Visit: Payer: Self-pay

## 2020-04-09 ENCOUNTER — Ambulatory Visit
Admission: RE | Admit: 2020-04-09 | Discharge: 2020-04-09 | Disposition: A | Discharge status: Home / Self Care | Payer: Medicare Other | Source: Ambulatory Visit | Attending: Student in an Organized Health Care Education/Training Program | Admitting: Student in an Organized Health Care Education/Training Program

## 2020-04-09 ENCOUNTER — Encounter: Payer: Self-pay | Admitting: Student in an Organized Health Care Education/Training Program

## 2020-04-09 DIAGNOSIS — M47816 Spondylosis without myelopathy or radiculopathy, lumbar region: Secondary | ICD-10-CM | POA: Insufficient documentation

## 2020-04-09 DIAGNOSIS — G894 Chronic pain syndrome: Secondary | ICD-10-CM | POA: Diagnosis present

## 2020-04-09 MED ORDER — ROPIVACAINE HCL 2 MG/ML IJ SOLN
9.0000 mL | Freq: Once | INTRAMUSCULAR | Status: AC
  Administered 2020-04-09: 9 mL via PERINEURAL

## 2020-04-09 MED ORDER — LIDOCAINE HCL 2 % IJ SOLN
INTRAMUSCULAR | Status: AC
  Filled 2020-04-09: qty 20

## 2020-04-09 MED ORDER — DEXAMETHASONE SODIUM PHOSPHATE 10 MG/ML IJ SOLN
INTRAMUSCULAR | Status: AC
  Filled 2020-04-09: qty 2

## 2020-04-09 MED ORDER — FENTANYL CITRATE (PF) 100 MCG/2ML IJ SOLN
INTRAMUSCULAR | Status: AC
  Filled 2020-04-09: qty 2

## 2020-04-09 MED ORDER — ROPIVACAINE HCL 2 MG/ML IJ SOLN
INTRAMUSCULAR | Status: AC
  Filled 2020-04-09: qty 20

## 2020-04-09 MED ORDER — LIDOCAINE HCL 2 % IJ SOLN
20.0000 mL | Freq: Once | INTRAMUSCULAR | Status: AC
  Administered 2020-04-09: 400 mg

## 2020-04-09 MED ORDER — DEXAMETHASONE SODIUM PHOSPHATE 10 MG/ML IJ SOLN
10.0000 mg | Freq: Once | INTRAMUSCULAR | Status: AC
  Administered 2020-04-09: 10 mg

## 2020-04-09 MED ORDER — FENTANYL CITRATE (PF) 100 MCG/2ML IJ SOLN
25.0000 ug | INTRAMUSCULAR | Status: AC | PRN
  Administered 2020-04-09 (×4): 25 ug via INTRAVENOUS

## 2020-04-09 MED ORDER — MIDAZOLAM HCL 5 MG/5ML IJ SOLN
INTRAMUSCULAR | Status: AC
  Filled 2020-04-09: qty 5

## 2020-04-09 NOTE — Progress Notes (Signed)
PROVIDER NOTE: Information contained herein reflects review and annotations entered in association with encounter. Interpretation of such information and data should be left to medically-trained personnel. Information provided to patient can be located elsewhere in the medical record under "Patient Instructions". Document created using STT-dictation technology, any transcriptional errors that may result from process are unintentional.    Patient: Sabrina [name]  Service Category: Procedure  Provider: Gillis Santa, MD  DOB: [birth year]  DOS: 04/09/2020  Location: [facility]  MRN: [id number]  Setting: Ambulatory - outpatient  Referring Provider: Gillis Santa, MD  Type: Established Patient  Specialty: Interventional Pain Management  PCP: Crecencio Mc, MD   Primary Reason for Visit: Interventional Pain Management Treatment. CC: Back Pain (low)  Procedure:          Anesthesia, Analgesia, Anxiolysis:  Type: Lumbar Facet, Medial Branch Block(s) #1  Primary Purpose: Diagnostic Region: Posterolateral Lumbosacral Spine Level:L3, L4, L5,  Medial Branch Level(s). Injecting these levels blocks the L3-4, L4-5,  lumbar facet joints. Laterality: Bilateral  Type: Moderate (Conscious) Sedation combined with Local Anesthesia Indication(s): Analgesia and Anxiety Route: Intravenous (IV) IV Access: Secured Sedation: Meaningful verbal contact was maintained at all times during the procedure  Local Anesthetic: Lidocaine 1-2%  Position: Prone   Indications: 1. Lumbar spondylosis   2. Lumbar facet arthropathy   3. Chronic pain syndrome    Pain Score: Pre-procedure: 3/10 Post-procedure: 0-No pain/10   Patient stopped Plavix 7 days prior.  Pre-op Assessment:  Sabrina [name] is a 79 y.o. (year old), female patient, seen today for interventional treatment. She  has a past surgical history that includes Cholecystectomy; Tonsillectomy and adenoidectomy; Abdominal hysterectomy (1976); Total  hip arthroplasty (Left); Carotid angioplasty (Left); Coronary angioplasty with stent (2000); Toe amputation (Right); Cystoscopy with stent placement (Bilateral); Total hip arthroplasty (Right, 2015); Coronary artery bypass graft (2000); Carotid endarterectomy (Left); Renal artery angioplasty (Bilateral, mARCH 2015); Colonoscopy with propofol (N/A, 08/16/2017); Esophagogastroduodenoscopy (egd) with propofol (N/A, 08/16/2017); Joint replacement (Bilateral, 2000); and Esophagogastroduodenoscopy (egd) with propofol (N/A, 06/29/2018). [name] has a current medication list which includes the following prescription(s): aspirin, chlordiazepoxide, clopidogrel, fentanyl, ferrous sulfate, hydralazine, labetalol, lisinopril, nortriptyline, omega 3, pantoprazole, tramadol, zetia, fentanyl, and narcan. Her primarily concern today is the Back Pain (low)  Initial Vital Signs:  Pulse/HCG Rate: 73ECG Heart Rate: 72 Temp: 97.7 F (36.5 C) Resp: 18 BP: (!) 185/69 SpO2: 100 %  BMI: Estimated body mass index is 30.61 kg/m as calculated from the following:   Height as of this encounter: 5\' 1"  (1.549 m).   Weight as of this encounter: 162 lb (73.5 kg).  Risk Assessment: Allergies: Reviewed. She is allergic to citalopram, dilaudid [hydromorphone hcl], hydrochlorothiazide, nsaids, nubain [nalbuphine hcl], penicillins, prasugrel, and statins.  Allergy Precautions: None required Coagulopathies: Reviewed. None identified.  Blood-thinner therapy: None at this time Active Infection(s): Reviewed. None identified. [name] is afebrile  Site Confirmation: Ms. [name] was asked to confirm the procedure and laterality before marking the site Procedure checklist: Completed Consent: Before the procedure and under the influence of no sedative(s), amnesic(s), or anxiolytics, the patient was informed of the treatment options, risks and possible complications. To fulfill our ethical and legal obligations, as recommended by  the American Medical Association's Code of Ethics, I have informed the patient of my clinical impression; the nature and purpose of the treatment or procedure; the risks, benefits, and possible complications of the intervention; the alternatives, including doing nothing; the risk(s) and benefit(s) of the alternative treatment(s) or  procedure(s); and the risk(s) and benefit(s) of doing nothing. The patient was provided information about the general risks and possible complications associated with the procedure. These may include, but are not limited to: failure to achieve desired goals, infection, bleeding, organ or nerve damage, allergic reactions, paralysis, and death. In addition, the patient was informed of those risks and complications associated to Spine-related procedures, such as failure to decrease pain; infection (i.e.: Meningitis, epidural or intraspinal abscess); bleeding (i.e.: epidural hematoma, subarachnoid hemorrhage, or any other type of intraspinal or peri-dural bleeding); organ or nerve damage (i.e.: Any type of peripheral nerve, nerve root, or spinal cord injury) with subsequent damage to sensory, motor, and/or autonomic systems, resulting in permanent pain, numbness, and/or weakness of one or several areas of the body; allergic reactions; (i.e.: anaphylactic reaction); and/or death. Furthermore, the patient was informed of those risks and complications associated with the medications. These include, but are not limited to: allergic reactions (i.e.: anaphylactic or anaphylactoid reaction(s)); adrenal axis suppression; blood sugar elevation that in diabetics may result in ketoacidosis or comma; water retention that in patients with history of congestive heart failure may result in shortness of breath, pulmonary edema, and decompensation with resultant heart failure; weight gain; swelling or edema; medication-induced neural toxicity; particulate matter embolism and blood vessel occlusion with  resultant organ, and/or nervous system infarction; and/or aseptic necrosis of one or more joints. Finally, the patient was informed that Medicine is not an exact science; therefore, there is also the possibility of unforeseen or unpredictable risks and/or possible complications that may result in a catastrophic outcome. The patient indicated having understood very clearly. We have given the patient no guarantees and we have made no promises. Enough time was given to the patient to ask questions, all of which were answered to the patient's satisfaction. Ms. Gloster has indicated that she wanted to continue with the procedure. Attestation: I, the ordering provider, attest that I have discussed with the patient the benefits, risks, side-effects, alternatives, likelihood of achieving goals, and potential problems during recovery for the procedure that I have provided informed consent. Date  Time: 04/09/2020 10:38 AM  Pre-Procedure Preparation:  Monitoring: As per clinic protocol. Respiration, ETCO2, SpO2, BP, heart rate and rhythm monitor placed and checked for adequate function Safety Precautions: Patient was assessed for positional comfort and pressure points before starting the procedure. Time-out: I initiated and conducted the "Time-out" before starting the procedure, as per protocol. The patient was asked to participate by confirming the accuracy of the "Time Out" information. Verification of the correct person, site, and procedure were performed and confirmed by me, the nursing staff, and the patient. "Time-out" conducted as per Joint Commission's Universal Protocol (UP.01.01.01). Time: 1126  Description of Procedure:          Laterality: Bilateral. The procedure was performed in identical fashion on both sides. Levels:  L3, L4, L5,  Medial Branch Level(s) Area Prepped: Posterior Lumbosacral Region DuraPrep (Iodine Povacrylex [0.7% available iodine] and Isopropyl Alcohol, 74% w/w) Safety  Precautions: Aspiration looking for blood return was conducted prior to all injections. At no point did we inject any substances, as a needle was being advanced. Before injecting, the patient was told to immediately notify me if she was experiencing any new onset of "ringing in the ears, or metallic taste in the mouth". No attempts were made at seeking any paresthesias. Safe injection practices and needle disposal techniques used. Medications properly checked for expiration dates. SDV (single dose vial) medications used. After the completion  of the procedure, all disposable equipment used was discarded in the proper designated medical waste containers. Local Anesthesia: Protocol guidelines were followed. The patient was positioned over the fluoroscopy table. The area was prepped in the usual manner. The time-out was completed. The target area was identified using fluoroscopy. A 12-in long, straight, sterile hemostat was used with fluoroscopic guidance to locate the targets for each level blocked. Once located, the skin was marked with an approved surgical skin marker. Once all sites were marked, the skin (epidermis, dermis, and hypodermis), as well as deeper tissues (fat, connective tissue and muscle) were infiltrated with a small amount of a short-acting local anesthetic, loaded on a 10cc syringe with a 25G, 1.5-in  Needle. An appropriate amount of time was allowed for local anesthetics to take effect before proceeding to the next step. Local Anesthetic: Lidocaine 2.0% The unused portion of the local anesthetic was discarded in the proper designated containers. Technical explanation of process:   L3 Medial Branch Nerve Block (MBB): The target area for the L3 medial branch is at the junction of the postero-lateral aspect of the superior articular process and the superior, posterior, and medial edge of the transverse process of L4. Under fluoroscopic guidance, a Quincke needle was inserted until contact was made  with os over the superior postero-lateral aspect of the pedicular shadow (target area). After negative aspiration for blood, 1.42mL of the nerve block solution was injected without difficulty or complication. The needle was removed intact. L4 Medial Branch Nerve Block (MBB): The target area for the L4 medial branch is at the junction of the postero-lateral aspect of the superior articular process and the superior, posterior, and medial edge of the transverse process of L5. Under fluoroscopic guidance, a Quincke needle was inserted until contact was made with os over the superior postero-lateral aspect of the pedicular shadow (target area). After negative aspiration for blood, 1.5 mL of the nerve block solution was injected without difficulty or complication. The needle was removed intact. L5 Medial Branch Nerve Block (MBB): The target area for the L5 medial branch is at the junction of the postero-lateral aspect of the superior articular process and the superior, posterior, and medial edge of the sacral ala. Under fluoroscopic guidance, a Quincke needle was inserted until contact was made with os over the superior postero-lateral aspect of the pedicular shadow (target area). After negative aspiration for blood, 1.5 mL of the nerve block solution was injected without difficulty or complication. The needle was removed intact.  Nerve block solution: 10 cc solution made of 8 cc of 0.2% ropivacaine, 2 cc of Decadron 10 mg/cc.  1.5 cc injected at each level above bilaterally.  The unused portion of the solution was discarded in the proper designated containers. Procedural Needles: 22-gauge, 3.5-inch, Quincke needles used for all levels.  Once the entire procedure was completed, the treated area was cleaned, making sure to leave some of the prepping solution back to take advantage of its long term bactericidal properties.   Illustration of the posterior view of the lumbar spine and the posterior neural structures.  Laminae of L2 through S1 are labeled. DPRL5, dorsal primary ramus of L5; DPRS1, dorsal primary ramus of S1; DPR3, dorsal primary ramus of L3; FJ, facet (zygapophyseal) joint L3-L4; I, inferior articular process of L4; LB1, lateral branch of dorsal primary ramus of L1; IAB, inferior articular branches from L3 medial branch (supplies L4-L5 facet joint); IBP, intermediate branch plexus; MB3, medial branch of dorsal primary ramus of L3; NR3,  third lumbar nerve root; S, superior articular process of L5; SAB, superior articular branches from L4 (supplies L4-5 facet joint also); TP3, transverse process of L3.  Vitals:   04/09/20 1155 04/09/20 1205 04/09/20 1215 04/09/20 1223  BP: (!) 128/98 (!) 150/95 (!) 167/84 (!) 159/84  Pulse: 80     Resp: 16 16 16 17   Temp:  (!) 97.4 F (36.3 C)  98.2 F (36.8 C)  SpO2: 99% 100% 100% 100%  Weight:      Height:         Start Time: 1126 hrs. End Time: 1157 hrs.  Imaging Guidance (Spinal):          Type of Imaging Technique: Fluoroscopy Guidance (Spinal) Indication(s): Assistance in needle guidance and placement for procedures requiring needle placement in or near specific anatomical locations not easily accessible without such assistance. Exposure Time: Please see nurses notes. Contrast: None used. Fluoroscopic Guidance: I was personally present during the use of fluoroscopy. "Tunnel Vision Technique" used to obtain the best possible view of the target area. Parallax error corrected before commencing the procedure. "Direction-depth-direction" technique used to introduce the needle under continuous pulsed fluoroscopy. Once target was reached, antero-posterior, oblique, and lateral fluoroscopic projection used confirm needle placement in all planes. Images permanently stored in EMR. Interpretation: No contrast injected. I personally interpreted the imaging intraoperatively. Adequate needle placement confirmed in multiple planes. Permanent images saved into the  patient's record.  Antibiotic Prophylaxis:   Anti-infectives (From admission, onward)   None     Indication(s): None identified  Post-operative Assessment:  Post-procedure Vital Signs:  Pulse/HCG Rate: 8071 Temp: 98.2 F (36.8 C) Resp: 17 BP: (!) 159/84 SpO2: 100 %  EBL: None  Complications: No immediate post-treatment complications observed by team, or reported by patient.  Note: The patient tolerated the entire procedure well. A repeat set of vitals were taken after the procedure and the patient was kept under observation following institutional policy, for this type of procedure. Post-procedural neurological assessment was performed, showing return to baseline, prior to discharge. The patient was provided with post-procedure discharge instructions, including a section on how to identify potential problems. Should any problems arise concerning this procedure, the patient was given instructions to immediately contact us, at any time, without hesitation. In any case, we plan to contact the patient by telephone for a follow-up status report regarding this interventional procedure.  Comments:  No additional relevant information.  Plan of Care  Orders:  Orders Placed This Encounter  Procedures  . DG PA[facility] C-ARM 1-60 MIN NO REPORT    Intraoperative interpretation by procedural physician at [facility].    Standing Status:   Standing    Number of Occurrences:   1    Order Specific Question:   Reason for exam:    Answer:   Assistance in needle guidance and placement for procedures requiring needle placement in or near specific anatomical locations not easily accessible without such assistance.   Patient instructed to restart Plavix tomorrow so long as she is not having any  NEW lower extremity weakness.  Medications ordered for procedure: Meds ordered this encounter  Medications  . lidocaine (XYLOCAINE) 2 % (with pres) injection 400 mg  . fentaNYL (SUBLIMAZE)  injection 25-50 mcg    Make sure Narcan is available in the pyxis when using this medication. In the event of respiratory depression (RR< 8/min): Titrate NARCAN (naloxone) in increments of 0.1 to 0.2 mg IV at 2-3 minute intervals, until desired degree  of reversal.  . ropivacaine (PF) 2 mg/mL (0.2%) (NAROPIN) injection 9 mL  . ropivacaine (PF) 2 mg/mL (0.2%) (NAROPIN) injection 9 mL  . dexamethasone (DECADRON) injection 10 mg  . dexamethasone (DECADRON) injection 10 mg   Medications administered: We administered lidocaine, fentaNYL, ropivacaine (PF) 2 mg/mL (0.2%), ropivacaine (PF) 2 mg/mL (0.2%), dexamethasone, and dexamethasone.  See the medical record for exact dosing, route, and time of administration.  Follow-up plan:   Return in about 4 weeks (around 05/07/2020) for Post Procedure Evaluation, virtual.      L3-L5 facet blocks #1 04/09/2020   Recent Visits Date Type Provider Dept  03/27/20 Office Visit Gillis Santa, MD [facility]  Showing recent visits within past 90 days and meeting all other requirements Today's Visits Date Type Provider Dept  04/09/20 Procedure visit Gillis Santa, MD [facility]  Showing today's visits and meeting all other requirements Future Appointments Date Type Provider Dept  05/08/20 Appointment Gillis Santa, MD [facility]  Showing future appointments within next 90 days and meeting all other requirements  Disposition: Discharge home  Discharge (Date  Time): 04/09/2020; 1224 hrs.   Primary Care Physician: Crecencio Mc, MD Location: [facility] Note by: Gillis Santa, MD Date: 04/09/2020; Time: 2:44 PM  Disclaimer:  Medicine is not an exact science. The only guarantee in medicine is that nothing is guaranteed. It is important to note that the decision to proceed with this intervention was based on the information collected from the patient. The Data and conclusions were drawn from the  patient's questionnaire, the interview, and the physical examination. Because the information was provided in large part by the patient, it cannot be guaranteed that it has not been purposely or unconsciously manipulated. Every effort has been made to obtain as much relevant data as possible for this evaluation. It is important to note that the conclusions that lead to this procedure are derived in large part from the available data. Always take into account that the treatment will also be dependent on availability of resources and existing treatment guidelines, considered by other Pain Management Practitioners as being common knowledge and practice, at the time of the intervention. For Medico-Legal purposes, it is also important to point out that variation in procedural techniques and pharmacological choices are the acceptable norm. The indications, contraindications, technique, and results of the above procedure should only be interpreted and judged by a Board-Certified Interventional Pain Specialist with extensive familiarity and expertise in the same exact procedure and technique.

## 2020-04-09 NOTE — Patient Instructions (Signed)

## 2020-04-09 NOTE — Progress Notes (Signed)
Safety precautions to be maintained throughout the outpatient stay will include: orient to surroundings, keep bed in low position, maintain call bell within reach at all times, provide assistance with transfer out of bed and ambulation.  

## 2020-04-16 DIAGNOSIS — I10 Essential (primary) hypertension: Secondary | ICD-10-CM

## 2020-04-17 ENCOUNTER — Other Ambulatory Visit: Payer: Self-pay | Admitting: Internal Medicine

## 2020-04-17 DIAGNOSIS — I10 Essential (primary) hypertension: Secondary | ICD-10-CM

## 2020-04-17 MED ORDER — LABETALOL HCL 200 MG PO TABS: 200.0000 mg | ORAL_TABLET | Freq: Two times a day (BID) | ORAL | 1 refills | Status: DC

## 2020-04-17 NOTE — Telephone Encounter (Signed)
Labetalol has been refilled. Pt is requesting a refill on Fentanyl.  Refilled: 04/09/2020 Last OV: 12/31/2019 Next OV: not scheduled

## 2020-04-20 MED ORDER — FENTANYL 50 MCG/HR TD PT72: 1.0000 | MEDICATED_PATCH | TRANSDERMAL | 0 refills | Status: DC

## 2020-04-22 ENCOUNTER — Telehealth: Payer: Self-pay

## 2020-04-22 MED ORDER — FENTANYL 50 MCG/HR TD PT72: 1.0000 | MEDICATED_PATCH | TRANSDERMAL | 0 refills | Status: DC

## 2020-04-22 NOTE — Addendum Note (Signed)
Addended by: Crecencio Mc on: 04/22/2020 03:34 PM   Modules accepted: Orders

## 2020-04-22 NOTE — Telephone Encounter (Signed)
[facility] on [address] called and stated that they have been trying to order the Fentyal patch for pt but that are unable to get it in. She stated that the [facility] on [address] has them in stock and wanted to know if you could send the rx there. Pt is okay with getting it at [facility] on [address].

## 2020-04-22 NOTE — Telephone Encounter (Signed)
Pt is calling back checking on prescription.

## 2020-04-22 NOTE — Telephone Encounter (Signed)
Fentanyl month rx sent to [facility] on [address]

## 2020-04-22 NOTE — Telephone Encounter (Signed)
Spoke with pt and she is aware that medication has been sent to [facility] on [address].

## 2020-05-05 ENCOUNTER — Other Ambulatory Visit: Payer: Self-pay

## 2020-05-05 ENCOUNTER — Encounter: Payer: Self-pay | Admitting: Internal Medicine

## 2020-05-05 ENCOUNTER — Ambulatory Visit (INDEPENDENT_AMBULATORY_CARE_PROVIDER_SITE_OTHER): Payer: Medicare Other | Admitting: Internal Medicine

## 2020-05-05 VITALS — BP 150/86 | HR 75 | Temp 98.5°F | Resp 16 | Ht 61.0 in | Wt 165.6 lb

## 2020-05-05 DIAGNOSIS — M47812 Spondylosis without myelopathy or radiculopathy, cervical region: Secondary | ICD-10-CM | POA: Diagnosis not present

## 2020-05-05 DIAGNOSIS — F1190 Opioid use, unspecified, uncomplicated: Secondary | ICD-10-CM

## 2020-05-05 DIAGNOSIS — I6523 Occlusion and stenosis of bilateral carotid arteries: Secondary | ICD-10-CM | POA: Diagnosis not present

## 2020-05-05 DIAGNOSIS — Z1231 Encounter for screening mammogram for malignant neoplasm of breast: Secondary | ICD-10-CM | POA: Diagnosis not present

## 2020-05-05 DIAGNOSIS — E2839 Other primary ovarian failure: Secondary | ICD-10-CM

## 2020-05-05 DIAGNOSIS — M48062 Spinal stenosis, lumbar region with neurogenic claudication: Secondary | ICD-10-CM

## 2020-05-05 DIAGNOSIS — N1831 Chronic kidney disease, stage 3a: Secondary | ICD-10-CM

## 2020-05-05 DIAGNOSIS — M47816 Spondylosis without myelopathy or radiculopathy, lumbar region: Secondary | ICD-10-CM

## 2020-05-05 DIAGNOSIS — Z0000 Encounter for general adult medical examination without abnormal findings: Secondary | ICD-10-CM

## 2020-05-05 DIAGNOSIS — D631 Anemia in chronic kidney disease: Secondary | ICD-10-CM

## 2020-05-05 NOTE — Patient Instructions (Addendum)
I am referring you to [name] for your back pain .  He may order nerve conduction studies to confirm nerve root impingement suggested by the MRI   Your annual mammogram has been ordered.  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 Most tremors affect the hands or arms. Tremors can also affect the head, vocal cords, legs, and other parts of the body. Essential tremor is a tremor without a known cause. Usually, it occurs while a person is trying to perform an action. It tends to get worse gradually as a person ages. What are the causes? The cause of this condition is not known. What increases the risk? You are more likely to develop this condition if:  You have a family member with essential tremor.  You are age 18 or older.  You take certain medicines. What are the signs or symptoms? The main sign of a tremor is a rhythmic shaking of certain parts of your body that is uncontrolled and unintentional. You may:  Have difficulty eating with a spoon or fork.  Have difficulty writing.  Nod your head up and down or side to side.  Have a quivering voice. The shaking may:  Get worse over time.  Come and go.  Be more noticeable on one side of your body.  Get worse due to stress, fatigue, caffeine, and extreme heat or cold. How is this diagnosed? This condition may be diagnosed based on:  Your symptoms and medical history.  A physical exam. There is no single test to diagnose an essential tremor. However, your health care provider may order tests to rule out other causes of your condition. These may include:  Blood and urine tests.  Imaging studies of your brain, such as CT scan and MRI.  A test that measures  involuntary muscle movement (electromyogram). How is this treated? Treatment for essential tremor depends on the severity of the condition.  Some tremors may go away without treatment.  Mild tremors may not need treatment if they do not affect your day-to-day life.  Severe tremors may need to be treated using one or more of the following options: ? Medicines. ? Lifestyle changes. ? Occupational or physical therapy. Follow these instructions at home: Lifestyle   Do not use any products that contain nicotine or tobacco, such as cigarettes and e-cigarettes. If you need help quitting, ask your health care provider.  Limit your caffeine intake as told by your health care provider.  Try to get 8 hours of sleep each night.  Find ways to manage your stress that fits your lifestyle and personality. Consider trying meditation or yoga.  Try to anticipate stressful situations and allow extra time to manage them.  If you are struggling emotionally with the effects of your tremor, consider working with a mental health provider. General instructions  Take over-the-counter and prescription medicines only as told by your health care provider.  Avoid extreme heat and extreme cold.  Keep all follow-up visits as told by your health care provider. This is important. Visits may include physical therapy visits. Contact a health care provider if:  You experience any changes in the location or intensity of your tremors.  You start having a tremor  after starting a new medicine.  You have tremor with other symptoms, such as: ? Numbness. ? Tingling. ? Pain. ? Weakness.  Your tremor gets worse.  Your tremor interferes with your daily life.  You feel down, blue, or sad for at least 2 weeks in a row.  Worrying about your tremor and what other people think about you interferes with your everyday life functions, including relationships, work, or school. Summary  Essential tremor is a tremor  without a known cause. Usually, it occurs when you are trying to perform an action.  The cause of this condition is not known.  The main sign of a tremor is a rhythmic shaking of certain parts of your body that is uncontrolled and unintentional.  Treatment for essential tremor depends on the severity of the condition. This information is not intended to replace advice given to you by your health care provider. Make sure you discuss any questions you have with your health care provider. Document Revised: 10/21/2017 Document Reviewed: 10/21/2017 Elsevier Patient Education  2020 [employer].

## 2020-05-05 NOTE — Progress Notes (Signed)
Patient ID: Sabrina [name], female    DOB: 1941-02-18  Age: 79 y.o. MRN: [id number]  The patient is here for annual follow up and examination and management of other chronic and acute problems.  This visit occurred during the SARS-CoV-2 public health emergency.  Safety protocols were in place, including screening questions prior to the visit, additional usage of staff PPE, and extensive cleaning of exam room while observing appropriate contact time as indicated for disinfecting solutions.      The risk factors are reflected in the social history.  The roster of all physicians providing medical care to patient - is listed in the Snapshot section of the chart.  Activities of daily living:  The patient is 100% independent in all ADLs: dressing, toileting, feeding as well as independent mobility  Home safety : The patient has smoke detectors in the home. They wear seatbelts.  There are no firearms at home. There is no violence in the home.   There is no risks for hepatitis, STDs or HIV. There is no   history of blood transfusion. They have no travel history to infectious disease endemic areas of the world.  The patient has seen their dentist in the last six month. They have seen their eye doctor in the last year. They admit to slight hearing difficulty with regard to whispered voices and some television programs.  They have deferred audiologic testing in the last year.  They do not  have excessive sun exposure. Discussed the need for sun protection: hats, long sleeves and use of sunscreen if there is significant sun exposure.   Diet: the importance of a healthy diet is discussed. They do have a healthy diet.  The benefits of regular aerobic exercise were discussed. She is unable to walk due to recurrent severe radicular pain .   Depression screen: there are no signs or vegative symptoms of depression- irritability, change in appetite, anhedonia, sadness/tearfullness.  Cognitive assessment:  the patient manages all their financial and personal affairs and is actively engaged. They could relate day,date,year and events; recalled 2/3 objects at 3 minutes; performed clock-face test normally.  The following portions of the patient's history were reviewed and updated as appropriate: allergies, current medications, past family history, past medical history,  past surgical history, past social history  and problem list.  Visual acuity was not assessed per patient preference since she has regular follow up with her ophthalmologist. Hearing and body mass index were assessed and reviewed.   During the course of the visit the patient was educated and counseled about appropriate screening and preventive services including : fall prevention , diabetes screening, nutrition counseling, colorectal cancer screening, and recommended immunizations.    CC: The primary encounter diagnosis was Encounter for screening mammogram for malignant neoplasm of breast. Diagnoses of Estrogen deficiency, Cervical spondylosis without myelopathy, Lumbar stenosis with neurogenic claudication, Encounter for preventive health examination, Lumbar spondylosis, Anemia due to stage 3a chronic kidney disease, and Chronic narcotic use were also pertinent to this visit.  1) chronic back pain:  lateef did 4 injections during  procedure 7 days of relief.  chesnis did 2 ESI .  Has no pai with sitting,  But after ten steps starts to have pain that radiates down left leg. .  Able to grocery shop using a cart for support,  Able to drive.  Household chores very difficult..  Cooking causes severe radicular pain to her left heel that lasts 10 to 15 second and is incapacitating.  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   She has a past surgical history that includes Cholecystectomy; Tonsillectomy and adenoidectomy; Abdominal hysterectomy (1976); Total hip arthroplasty (Left); Carotid angioplasty (Left); Coronary angioplasty with stent (2000); Toe amputation (Right); Cystoscopy with stent placement (Bilateral); Total hip arthroplasty (Right, 2015); Coronary artery bypass graft (2000); Carotid endarterectomy (Left); Renal artery angioplasty (Bilateral, mARCH 2015); Colonoscopy with propofol (N/A, 08/16/2017); Esophagogastroduodenoscopy (egd) with propofol (N/A, 08/16/2017); Joint replacement (Bilateral, 2000); and Esophagogastroduodenoscopy (egd) with propofol (N/A, 06/29/2018).   Her family history includes Breast cancer (age of onset: 37) in her paternal aunt; Cerebral aneurysm in her son and son; Diabetes in her mother; Heart disease in her sister; Hypertension in her father and mother; Multiple sclerosis in her daughter and son; Seizures in her son; Stroke in her mother.She reports that she quit smoking about 21 years ago. She has never used smokeless tobacco. She reports that she does not drink alcohol and does not use drugs.  Outpatient Medications Prior to Visit  Medication Sig Dispense Refill  . aspirin 81 MG tablet Take 81 mg by mouth daily.    . chlordiazePOXIDE (LIBRIUM) 25 MG capsule TAKE ONE CAPSULE BY MOUTH ONCE DAILY AS NEEDED FOR ANXIETY 30 capsule 5  . clopidogrel (PLAVIX) 75 MG tablet Take 1 tablet (75 mg total) by mouth daily. 90 tablet 3  . fentaNYL (DURAGESIC) 50 MCG/HR Place 1 patch onto the skin every 3 (three) days. 10 patch 0  . fentaNYL (DURAGESIC) 50 MCG/HR Place 1 patch onto the skin every 3 (three) days. 10 patch 0  . ferrous sulfate (FEROSUL) 325 (65 FE) MG  tablet Take 1 tablet (325 mg total) by mouth daily with breakfast. 90 tablet 0  . hydrALAZINE (APRESOLINE) 50 MG tablet TAKE 1 TABLET BY MOUTH THREE TIMES DAILY AS NEEDED FOR BLOOD PRESSURE GREATER THAN 150 270 tablet 1  . labetalol (NORMODYNE) 200 MG tablet Take 1 tablet (200 mg total) by mouth 2 (two) times daily. 180 tablet 1  . lisinopril (ZESTRIL) 20 MG tablet Take 1 tablet (20 mg total) by mouth 2 (two) times daily. 180 tablet 1  . NARCAN 4 MG/0.1ML LIQD nasal spray kit     . nortriptyline (PAMELOR) 10 MG capsule Take 1 capsule (10 mg total) by mouth 2 (two) times daily. 180 capsule 1  . Omega 3 1000 MG CAPS Take 1 capsule by mouth 2 (two) times daily.     . pantoprazole (PROTONIX) 40 MG tablet Take 1 tablet (40 mg total) by mouth daily. On an empty stomach  For gastritis 30 tablet 3  . traMADol (ULTRAM) 50 MG tablet TAKE 2 TABLETS BY MOUTH EVERY 12 HOURS AS NEEDED. MAX OF 4 TABLETS DAILY. 120 tablet 0  . ZETIA 10 MG tablet Take 1 tablet (10 mg total) by mouth daily. 90 tablet 1   No facility-administered medications prior to visit.    Review of Systems   Patient denies headache, fevers, malaise, unintentional weight loss, skin rash, eye pain, sinus congestion and sinus pain, sore throat, dysphagia,  hemoptysis , cough, dyspnea, wheezing, chest pain, palpitations, orthopnea, edema, abdominal  pain, nausea, melena, diarrhea, constipation, flank pain, dysuria, hematuria, urinary  Frequency, nocturia, numbness, tingling, seizures,  Focal weakness, Loss of consciousness,  Tremor, insomnia, depression, anxiety, and suicidal ideation.     Objective:  BP (!) 150/86 (BP Location: Right Arm, Patient Position: Sitting, Cuff Size: Normal)   Pulse 75   Temp 98.5 F (36.9 C) (Oral)   Resp 16   Ht _0  (1.549 m)   Wt 165 lb 9.6 oz (75.1 kg)   SpO2 97%   BMI 31.29 kg/m   Physical Exam  General appearance: alert, cooperative and appears stated age Ears: normal TM's and external ear canals  both ears Throat: lips, mucosa, and tongue normal; teeth and gums normal Neck: no adenopathy, no carotid bruit, supple, symmetrical, trachea midline and thyroid not enlarged, symmetric, no tenderness/mass/nodules Back: symmetric, no curvature. ROM normal. No CVA tenderness. Lungs: clear to auscultation bilaterally Heart: regular rate and rhythm, S1, S2 normal, no murmur, click, rub or gallop Abdomen: soft, non-tender; bowel sounds normal; no masses,  no organomegaly Pulses: 2+ and symmetric Skin: Skin color, texture, turgor normal. No rashes or lesions Lymph nodes: Cervical, supraclavicular, and axillary nodes normal.  Assessment & Plan:   Problem List Items Addressed This Visit      Unprioritized   Chronic narcotic use    .Marland KitchenShe has not had any ER visits  And has not requested any early refills.  Her Refill history was confirmed via [place] Controlled Substance database by me today during her visit and there have been no prescriptions of controlled substances filled from any providers other than me.  She was given an rx for Narcan from NP       Cervical spondylosis without myelopathy    Managed with fentanyl patches.  She has not had any ER visits  And has not requested any early refills.  Her Refill history was confirmed via [place] Controlled Substance database by me today during her visit and there have been no prescriptions of controlled substances filled from any providers other than me. .       Lumbar stenosis with neurogenic claudication    Referral to [name] for EMG.[place] studies as she is unable to live a normal life due to radicular pain and has not responded to multiple ESI       Relevant Orders   Ambulatory referral to Neurology   Anemia    Iron stores were normal per hematology but would need increasing prior to Epogen shot which is being considered given persistent anemia.       Lumbar spondylosis    No response to ESI , multiple attempts.  Needs EMG.[place] studies to determine if  L4 and L5 nerve root impingement is causative      Encounter for preventive health examination    age appropriate education and counseling updated, referrals for preventative services and immunizations addressed, dietary and smoking counseling addressed, most recent labs reviewed.  I have personally reviewed and have noted:  1) the patient's medical and social history 2) The pt's use of alcohol, tobacco, and illicit drugs 3) The patient's current medications and supplements 4) Functional ability including ADL's, fall risk, home safety risk, hearing and visual impairment 5) Diet and physical activities 6) Evidence for depression or mood disorder 7) The patient's height, weight, and BMI have been recorded in the chart  I have made referrals, and provided counseling and education based on review of the above       Other Visit Diagnoses  Encounter for screening mammogram for malignant neoplasm of breast    -  Primary   Relevant Orders   MM 3D SCREEN BREAST BILATERAL   Estrogen deficiency       Relevant Orders   DG Bone Density      I am having Sabrina [name] maintain her aspirin, Omega 3, hydrALAZINE, clopidogrel, ferrous sulfate, chlordiazePOXIDE, traMADol, nortriptyline, Narcan, pantoprazole, Zetia, lisinopril, labetalol, fentaNYL, and fentaNYL.  No orders of the defined types were placed in this encounter.   There are no discontinued medications.  Follow-up: No follow-ups on file.   Crecencio Mc, MD

## 2020-05-06 DIAGNOSIS — Z0000 Encounter for general adult medical examination without abnormal findings: Secondary | ICD-10-CM | POA: Insufficient documentation

## 2020-05-06 DIAGNOSIS — Z79899 Other long term (current) drug therapy: Secondary | ICD-10-CM | POA: Insufficient documentation

## 2020-05-06 NOTE — Assessment & Plan Note (Signed)
No response to [facility] , multiple attempts.  Needs EMG.[place] studies to determine if L4 and L5 nerve root impingement is causative

## 2020-05-06 NOTE — Assessment & Plan Note (Signed)

## 2020-05-06 NOTE — Assessment & Plan Note (Signed)
Referral to [name] for EMG.[place] studies as she is unable to live a normal life due to radicular pain and has not responded to multiple ESI

## 2020-05-06 NOTE — Assessment & Plan Note (Signed)
Iron stores were normal per hematology but would need increasing prior to Epogen shot which is being considered given persistent anemia.

## 2020-05-06 NOTE — Assessment & Plan Note (Signed)
..  She has not had any ER visits  And has not requested any early refills.  Her Refill history was confirmed via [place] Controlled Substance database by me today during her visit and there have been no prescriptions of controlled substances filled from any providers other than me.  She was given an rx for Narcan from NP

## 2020-05-06 NOTE — Assessment & Plan Note (Signed)
Managed with fentanyl patches.  She has not had any ER visits  And has not requested any early refills.  Her Refill history was confirmed via [place] Controlled Substance database by me today during her visit and there have been no prescriptions of controlled substances filled from any providers other than me. Marland Kitchen

## 2020-05-07 ENCOUNTER — Encounter: Payer: Self-pay | Admitting: Student in an Organized Health Care Education/Training Program

## 2020-05-08 ENCOUNTER — Encounter: Payer: Self-pay | Admitting: Student in an Organized Health Care Education/Training Program

## 2020-05-08 ENCOUNTER — Ambulatory Visit
Payer: Medicare Other | Attending: Student in an Organized Health Care Education/Training Program | Admitting: Student in an Organized Health Care Education/Training Program

## 2020-05-08 ENCOUNTER — Other Ambulatory Visit: Payer: Self-pay

## 2020-05-08 DIAGNOSIS — M47816 Spondylosis without myelopathy or radiculopathy, lumbar region: Secondary | ICD-10-CM

## 2020-05-08 DIAGNOSIS — G894 Chronic pain syndrome: Secondary | ICD-10-CM

## 2020-05-08 NOTE — Progress Notes (Signed)
Patient: Sabrina [name]  Service Category: E/M  Provider: Gillis Santa, MD  DOB: 01-28-1941  DOS: 05/08/2020  Location: Office  MRN: [id number]  Setting: Ambulatory outpatient  Referring Provider: Crecencio Mc, MD  Type: Established Patient  Specialty: Interventional Pain Management  PCP: Crecencio Mc, MD  Location: Home  Delivery: TeleHealth     Virtual Encounter - Pain Management PROVIDER NOTE: Information contained herein reflects review and annotations entered in association with encounter. Interpretation of such information and data should be left to medically-trained personnel. Information provided to patient can be located elsewhere in the medical record under "Patient Instructions". Document created using STT-dictation technology, any transcriptional errors that may result from process are unintentional.    Contact & Pharmacy Preferred: [phone] Home: [phone] (home) Mobile: [phone] (mobile) E-mail: [email or web address]  [facility] (N), [place] [facility] [place] ([place]) [place] [address] Phone: [phone] Fax: [place] [address], [place] - [place] [address] Phone: [phone] Fax: [phone]   Pre-screening  Ms. Murdock offered "in-person" vs "virtual" encounter. She indicated preferring virtual for this encounter.   Reason COVID-19*  Social distancing based on CDC and AMA recommendations.   I contacted Timmi A Gahan on 05/08/2020 via video conference.      I clearly identified myself as Gillis Santa, MD. I verified that I was speaking with the correct person using two identifiers (Name: Latondra A Manuele, and date of birth: August 08, 1941).  Consent I sought verbal advanced consent from Shawntelle A Crouse for virtual visit interactions. I informed Ms. Smethurst of possible security and privacy concerns, risks, and limitations  associated with providing "not-in-person" medical evaluation and management services. I also informed Ms. Caddell of the availability of "in-person" appointments. Finally, I informed her that there would be a charge for the virtual visit and that she could be  personally, fully or partially, financially responsible for it. Ms. Sharples expressed understanding and agreed to proceed.   Historic Elements   Ms. Lashay A Heitman is a 79 y.o. year old, female patient evaluated today after her last contact with our practice on 04/09/2020. Ms. 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 She also  has a past surgical history that includes Cholecystectomy; Tonsillectomy and adenoidectomy; Abdominal hysterectomy (1976); Total hip arthroplasty (Left); Carotid angioplasty (Left); Coronary angioplasty with stent (2000); Toe amputation (Right); Cystoscopy with stent placement (Bilateral); Total hip arthroplasty (Right, 2015); Coronary artery bypass graft (2000); Carotid endarterectomy (Left); Renal artery angioplasty (Bilateral, mARCH 2015); Colonoscopy with propofol (N/A, 08/16/2017); Esophagogastroduodenoscopy (egd) with propofol (N/A, 08/16/2017); Joint replacement (Bilateral, 2000); and Esophagogastroduodenoscopy (egd) with propofol (N/A, 06/29/2018). Ms. Thaw has a current medication list which includes the following prescription(s): aspirin, chlordiazepoxide, clopidogrel, fentanyl, fentanyl, ferrous sulfate, hydralazine, labetalol, lisinopril, narcan, nortriptyline, omega 3, pantoprazole, tramadol, and zetia. She  reports that she quit smoking about 21 years ago. She has never used smokeless  tobacco. She reports that she does not drink alcohol and does not use drugs. Ms. Whitmire is allergic to citalopram, dilaudid [hydromorphone hcl], hydrochlorothiazide, nsaids, nubain [nalbuphine hcl], penicillins, prasugrel, and statins.   HPI  Today, she is being contacted for a post-procedure assessment.   Post-Procedure Evaluation  Procedure:   Type: Lumbar Facet, Medial Branch Block(s) #1  Primary Purpose: Diagnostic Region: Posterolateral Lumbosacral Spine Level:L3, L4, L5,  Medial Branch Level(s). Injecting these levels blocks the L3-4, L4-5,  lumbar facet joints. Laterality: Bilateral  Sedation: Please see nurses note.  Effectiveness during initial hour after procedure(Ultra-Short Term Relief): 100 %   Local anesthetic used: Long-acting (4-6 hours) Effectiveness: Defined as any analgesic benefit obtained secondary to the administration of local anesthetics. This carries significant diagnostic value as to the etiological location, or anatomical origin, of the pain. Duration of benefit is expected to coincide with the duration of the local anesthetic used.  Effectiveness during initial 4-6 hours after procedure(Short-Term Relief): 100 %   Long-term benefit: Defined as any relief past the pharmacologic duration of the local anesthetics.  Effectiveness past the initial 6 hours after procedure(Long-Term Relief): 75 % (lasting 8 days)   Current benefits: Defined as benefit that persist at this time.   Analgesia:  <50% better Function: Back to baseline ROM: Back to baseline   Laboratory Chemistry Profile   Renal Lab Results  Component Value Date   BUN 19 03/10/2020   CREATININE 1.21 (H) 03/10/2020   LABCREA 86 03/06/2018   GFR 51.74 (L) 12/21/2018   GFRAA 50 (L) 03/10/2020   GFRNONAA 43 (L) 03/10/2020     Hepatic Lab Results  Component Value Date   AST 17 03/10/2020   ALT 13 03/10/2020   ALBUMIN 3.7 03/10/2020   ALKPHOS 62 03/10/2020   LIPASE 16.0 07/06/2017      Electrolytes Lab Results  Component Value Date   NA 137 03/10/2020   K 4.6 03/10/2020   CL 102 03/10/2020   CALCIUM 9.3 03/10/2020     Bone No results found for: VD25OH, VD125OH2TOT, XB3532DJ2, EQ6834HD6, 25OHVITD1, 25OHVITD2, 25OHVITD3, TESTOFREE, TESTOSTERONE   Inflammation (CRP: Acute Phase) (ESR: Chronic Phase) Lab Results  Component Value Date   CRP 0.2 (L) 02/22/2018   ESRSEDRATE 24 02/22/2018       Note: Above Lab results reviewed.    Assessment  The primary encounter diagnosis was Lumbar spondylosis. Diagnoses of Lumbar facet arthropathy and Chronic pain syndrome were also pertinent to this visit.  Plan of Care  Ms. Laira A Leis has a current medication list which includes the following long-term medication(s): ferrous sulfate, hydralazine, labetalol, lisinopril, nortriptyline, pantoprazole, and zetia.   Virtual follow-up evaluation after her prior diagnostic lumbar facet medial branch nerve block bilaterally at L3, L4, L5.  Patient notes approximately 75% to 80% pain relief of her low back that lasted for 8 days with gradual return of pain.  She states that her pain is now back to baseline but during those 8 days she also notes improvement in her ability to ambulate and ability to do household chores.  Given that she had a positive diagnostic lumbar facet medial branch nerve block, recommend repeating a second 1 and if that too is positive, we can consider therapeutic lumbar radiofrequency ablation at L3, L4, L5.  Risks and benefits of procedure were discussed and patient would like to proceed.  Patient also has been referred to Dr. Melrose Nakayama for EMG/nerve conduction velocity studies which is reasonable.  Continue medication management with Dr. Derrel Nip.  We will be focusing on interventional pain management.  Of note patient had a significant hyperalgesic response during her diagnostic lumbar facet medial branch nerve blocks.  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  I do not think that she could tolerate a spinal cord  stimulator trial under moderate sedation due to the size of the needle which is a 14-gauge Touhy however we will see how she does with her lumbar facet medial branch nerve block #2 followed possibly by lumbar radiofrequency ablation.  Patient was instructed to stop her Plavix 7 days prior to her scheduled procedure.  Orders:  Orders Placed This Encounter  Procedures  . LUMBAR FACET(MEDIAL BRANCH NERVE BLOCK) MBNB    Standing Status:   Future    Standing Expiration Date:   06/08/2020    Scheduling Instructions:     Procedure: Lumbar facet block (AKA.: Lumbosacral medial branch nerve block)     Side: Bilateral     Level: L3-4, L4-5,  Facets  (L3, L4, L5,Medial Branch Nerves)     Sedation: with     Timeframe: ASAA          Patient instructed to stop Plavix 7 days prior    Order Specific Question:   Where will this procedure be performed?    Answer:   [facility]   Follow-up plan:   Return in about 2 weeks (around 05/22/2020) for B/L L3, L4, L5 facet #2 with sedation (stop Plavix 7 days prior).     L3-L5 facet blocks #1 04/09/2020    Recent Visits Date Type Provider Dept  04/09/20 Procedure visit Gillis Santa, MD [facility]  03/27/20 Office Visit Gillis Santa, MD [facility]  Showing recent visits within past 90 days and meeting all other requirements Today's Visits Date Type Provider Dept  05/08/20 Telemedicine Gillis Santa, MD [facility]  Showing today's visits and meeting all other requirements Future Appointments No visits were found meeting these conditions. Showing future appointments within next 90 days and meeting all other requirements  I discussed the assessment and treatment plan with the patient. The patient was provided an opportunity to ask questions and all were answered. The patient agreed with the plan and demonstrated an understanding of the  instructions.  Patient advised to call back or seek an in-person evaluation if the symptoms or condition worsens.  Duration of encounter: 30 minutes.  Note by: Gillis Santa, MD Date: 05/08/2020; Time: 8:48 AM

## 2020-05-21 ENCOUNTER — Ambulatory Visit
Admission: RE | Admit: 2020-05-21 | Discharge: 2020-05-21 | Disposition: A | Discharge status: Home / Self Care | Payer: Medicare Other | Source: Ambulatory Visit | Attending: Student in an Organized Health Care Education/Training Program | Admitting: Student in an Organized Health Care Education/Training Program

## 2020-05-21 ENCOUNTER — Encounter: Payer: Self-pay | Admitting: Student in an Organized Health Care Education/Training Program

## 2020-05-21 ENCOUNTER — Other Ambulatory Visit: Payer: Self-pay

## 2020-05-21 ENCOUNTER — Ambulatory Visit (HOSPITAL_BASED_OUTPATIENT_CLINIC_OR_DEPARTMENT_OTHER): Payer: Medicare Other | Admitting: Student in an Organized Health Care Education/Training Program

## 2020-05-21 VITALS — BP 193/102 | HR 71 | Temp 97.3°F | Resp 19 | Ht 61.0 in | Wt 162.0 lb

## 2020-05-21 DIAGNOSIS — M47816 Spondylosis without myelopathy or radiculopathy, lumbar region: Secondary | ICD-10-CM | POA: Diagnosis not present

## 2020-05-21 DIAGNOSIS — G894 Chronic pain syndrome: Secondary | ICD-10-CM

## 2020-05-21 MED ORDER — ROPIVACAINE HCL 2 MG/ML IJ SOLN
9.0000 mL | Freq: Once | INTRAMUSCULAR | Status: AC
  Administered 2020-05-21: 10 mL via PERINEURAL
  Filled 2020-05-21: qty 10

## 2020-05-21 MED ORDER — LIDOCAINE HCL 2 % IJ SOLN
20.0000 mL | Freq: Once | INTRAMUSCULAR | Status: AC
  Administered 2020-05-21: 200 mg
  Filled 2020-05-21: qty 10

## 2020-05-21 MED ORDER — DEXAMETHASONE SODIUM PHOSPHATE 10 MG/ML IJ SOLN
10.0000 mg | Freq: Once | INTRAMUSCULAR | Status: AC
  Administered 2020-05-21: 10 mg
  Filled 2020-05-21: qty 1

## 2020-05-21 MED ORDER — MIDAZOLAM HCL 5 MG/5ML IJ SOLN
1.0000 mg | INTRAMUSCULAR | Status: DC | PRN
  Filled 2020-05-21: qty 5

## 2020-05-21 MED ORDER — FENTANYL CITRATE (PF) 100 MCG/2ML IJ SOLN
25.0000 ug | INTRAMUSCULAR | Status: DC | PRN
  Filled 2020-05-21: qty 2

## 2020-05-21 MED ORDER — DIAZEPAM 5 MG PO TABS
ORAL_TABLET | ORAL | Status: AC
  Filled 2020-05-21: qty 1

## 2020-05-21 MED ORDER — IOHEXOL 180 MG/ML  SOLN: 10.0000 mL | Freq: Once | INTRAMUSCULAR | Status: DC

## 2020-05-21 MED ORDER — DIAZEPAM 5 MG PO TABS
5.0000 mg | ORAL_TABLET | Freq: Once | ORAL | Status: AC
  Administered 2020-05-21: 5 mg via ORAL

## 2020-05-21 NOTE — Progress Notes (Signed)
PROVIDER NOTE: Information contained herein reflects review and annotations entered in association with encounter. Interpretation of such information and data should be left to medically-trained personnel. Information provided to patient can be located elsewhere in the medical record under "Patient Instructions". Document created using STT-dictation technology, any transcriptional errors that may result from process are unintentional.    Patient: Sabrina [name]  Service Category: Procedure  Provider: Gillis Santa, MD  DOB: [birth year]  DOS: 05/21/2020  Location: [facility]  MRN: [id number]  Setting: Ambulatory - outpatient  Referring Provider: Crecencio Mc, MD  Type: Established Patient  Specialty: Interventional Pain Management  PCP: Crecencio Mc, MD   Primary Reason for Visit: Interventional Pain Management Treatment. CC: Back Pain (back)  Procedure:          Anesthesia, Analgesia, Anxiolysis:  Type: Lumbar Facet, Medial Branch Block(s) #2  Primary Purpose: Diagnostic Region: Posterolateral Lumbosacral Spine Level:L3, L4, L5,  Medial Branch Level(s). Injecting these levels blocks the L3-4, L4-5,  lumbar facet joints. Laterality: Bilateral  Type: Local Anesthesia with p.o. Valium  Local Anesthetic: Lidocaine 1-2%  Position: Prone   Indications: 1. Lumbar spondylosis   2. Lumbar facet arthropathy   3. Chronic pain syndrome    Pain Score: Pre-procedure: 7/10 Post-procedure:3/10   Patient stopped Plavix 7 days prior.  Pre-op Assessment:  Ms. Schmuhl is a 79 y.o. (year old), female patient, seen today for interventional treatment. She  has a past surgical history that includes Cholecystectomy; Tonsillectomy and adenoidectomy; Abdominal hysterectomy (1976); Total hip arthroplasty (Left); Carotid angioplasty (Left); Coronary angioplasty with stent (2000); Toe amputation (Right); Cystoscopy with stent placement (Bilateral); Total hip arthroplasty (Right, 2015);  Coronary artery bypass graft (2000); Carotid endarterectomy (Left); Renal artery angioplasty (Bilateral, mARCH 2015); Colonoscopy with propofol (N/A, 08/16/2017); Esophagogastroduodenoscopy (egd) with propofol (N/A, 08/16/2017); Joint replacement (Bilateral, 2000); and Esophagogastroduodenoscopy (egd) with propofol (N/A, 06/29/2018). Ms. Lefevers has a current medication list which includes the following prescription(s): aspirin, chlordiazepoxide, clopidogrel, fentanyl, fentanyl, ferrous sulfate, hydralazine, labetalol, lisinopril, narcan, nortriptyline, omega 3, pantoprazole, tramadol, and zetia, and the following Facility-Administered Medications: iohexol and midazolam. Her primarily concern today is the Back Pain (back)  Initial Vital Signs:  Pulse/HCG Rate: 72ECG Heart Rate: 72 Temp: (!) 97.3 F (36.3 C) Resp: 18 BP: (!) 171/79 SpO2: 100 %  BMI: Estimated body mass index is 30.61 kg/m as calculated from the following:   Height as of this encounter: 5\' 1"  (1.549 m).   Weight as of this encounter: 162 lb (73.5 kg).  Risk Assessment: Allergies: Reviewed. She is allergic to citalopram, dilaudid [hydromorphone hcl], hydrochlorothiazide, nsaids, nubain [nalbuphine hcl], penicillins, prasugrel, and statins.  Allergy Precautions: None required Coagulopathies: Reviewed. None identified.  Blood-thinner therapy: None at this time Active Infection(s): Reviewed. None identified. Ms. Valido is afebrile  Site Confirmation: Ms. Fei was asked to confirm the procedure and laterality before marking the site Procedure checklist: Completed Consent: Before the procedure and under the influence of no sedative(s), amnesic(s), or anxiolytics, the patient was informed of the treatment options, risks and possible complications. To fulfill our ethical and legal obligations, as recommended by the American Medical Association's Code of Ethics, I have informed the patient of my clinical impression; the nature and  purpose of the treatment or procedure; the risks, benefits, and possible complications of the intervention; the alternatives, including doing nothing; the risk(s) and benefit(s) of the alternative treatment(s) or procedure(s); and the risk(s) and benefit(s) of doing nothing. The patient was provided information about the  general risks and possible complications associated with the procedure. These may include, but are not limited to: failure to achieve desired goals, infection, bleeding, organ or nerve damage, allergic reactions, paralysis, and death. In addition, the patient was informed of those risks and complications associated to Spine-related procedures, such as failure to decrease pain; infection (i.e.: Meningitis, epidural or intraspinal abscess); bleeding (i.e.: epidural hematoma, subarachnoid hemorrhage, or any other type of intraspinal or peri-dural bleeding); organ or nerve damage (i.e.: Any type of peripheral nerve, nerve root, or spinal cord injury) with subsequent damage to sensory, motor, and/or autonomic systems, resulting in permanent pain, numbness, and/or weakness of one or several areas of the body; allergic reactions; (i.e.: anaphylactic reaction); and/or death. Furthermore, the patient was informed of those risks and complications associated with the medications. These include, but are not limited to: allergic reactions (i.e.: anaphylactic or anaphylactoid reaction(s)); adrenal axis suppression; blood sugar elevation that in diabetics may result in ketoacidosis or comma; water retention that in patients with history of congestive heart failure may result in shortness of breath, pulmonary edema, and decompensation with resultant heart failure; weight gain; swelling or edema; medication-induced neural toxicity; particulate matter embolism and blood vessel occlusion with resultant organ, and/or nervous system infarction; and/or aseptic necrosis of one or more joints. Finally, the patient was  informed that Medicine is not an exact science; therefore, there is also the possibility of unforeseen or unpredictable risks and/or possible complications that may result in a catastrophic outcome. The patient indicated having understood very clearly. We have given the patient no guarantees and we have made no promises. Enough time was given to the patient to ask questions, all of which were answered to the patient's satisfaction. Ms. Khamis has indicated that she wanted to continue with the procedure. Attestation: I, the ordering provider, attest that I have discussed with the patient the benefits, risks, side-effects, alternatives, likelihood of achieving goals, and potential problems during recovery for the procedure that I have provided informed consent. Date  Time: 05/21/2020 11:26 AM  Pre-Procedure Preparation:  Monitoring: As per clinic protocol. Respiration, ETCO2, SpO2, BP, heart rate and rhythm monitor placed and checked for adequate function Safety Precautions: Patient was assessed for positional comfort and pressure points before starting the procedure. Time-out: I initiated and conducted the "Time-out" before starting the procedure, as per protocol. The patient was asked to participate by confirming the accuracy of the "Time Out" information. Verification of the correct person, site, and procedure were performed and confirmed by me, the nursing staff, and the patient. "Time-out" conducted as per Joint Commission's Universal Protocol (UP.01.01.01). Time: 1217  Description of Procedure:          Laterality: Bilateral. The procedure was performed in identical fashion on both sides. Levels:  L3, L4, L5,  Medial Branch Level(s) Area Prepped: Posterior Lumbosacral Region DuraPrep (Iodine Povacrylex [0.7% available iodine] and Isopropyl Alcohol, 74% w/w) Safety Precautions: Aspiration looking for blood return was conducted prior to all injections. At no point did we inject any substances, as  a needle was being advanced. Before injecting, the patient was told to immediately notify me if she was experiencing any new onset of "ringing in the ears, or metallic taste in the mouth". No attempts were made at seeking any paresthesias. Safe injection practices and needle disposal techniques used. Medications properly checked for expiration dates. SDV (single dose vial) medications used. After the completion of the procedure, all disposable equipment used was discarded in the proper designated medical waste containers.  Local Anesthesia: Protocol guidelines were followed. The patient was positioned over the fluoroscopy table. The area was prepped in the usual manner. The time-out was completed. The target area was identified using fluoroscopy. A 12-in long, straight, sterile hemostat was used with fluoroscopic guidance to locate the targets for each level blocked. Once located, the skin was marked with an approved surgical skin marker. Once all sites were marked, the skin (epidermis, dermis, and hypodermis), as well as deeper tissues (fat, connective tissue and muscle) were infiltrated with a small amount of a short-acting local anesthetic, loaded on a 10cc syringe with a 25G, 1.5-in  Needle. An appropriate amount of time was allowed for local anesthetics to take effect before proceeding to the next step. Local Anesthetic: Lidocaine 2.0% The unused portion of the local anesthetic was discarded in the proper designated containers. Technical explanation of process:   L3 Medial Branch Nerve Block (MBB): The target area for the L3 medial branch is at the junction of the postero-lateral aspect of the superior articular process and the superior, posterior, and medial edge of the transverse process of L4. Under fluoroscopic guidance, a Quincke needle was inserted until contact was made with os over the superior postero-lateral aspect of the pedicular shadow (target area). After negative aspiration for blood, 1.79mL  of the nerve block solution was injected without difficulty or complication. The needle was removed intact. L4 Medial Branch Nerve Block (MBB): The target area for the L4 medial branch is at the junction of the postero-lateral aspect of the superior articular process and the superior, posterior, and medial edge of the transverse process of L5. Under fluoroscopic guidance, a Quincke needle was inserted until contact was made with os over the superior postero-lateral aspect of the pedicular shadow (target area). After negative aspiration for blood, 1.5 mL of the nerve block solution was injected without difficulty or complication. The needle was removed intact. L5 Medial Branch Nerve Block (MBB): The target area for the L5 medial branch is at the junction of the postero-lateral aspect of the superior articular process and the superior, posterior, and medial edge of the sacral ala. Under fluoroscopic guidance, a Quincke needle was inserted until contact was made with os over the superior postero-lateral aspect of the pedicular shadow (target area). After negative aspiration for blood, 1.5 mL of the nerve block solution was injected without difficulty or complication. The needle was removed intact.  Nerve block solution: 10 cc solution made of 8 cc of 0.2% ropivacaine, 2 cc of Decadron 10 mg/cc.  1.5 cc injected at each level above bilaterally.  The unused portion of the solution was discarded in the proper designated containers. Procedural Needles: 22-gauge, 3.5-inch, Quincke needles used for all levels.  Once the entire procedure was completed, the treated area was cleaned, making sure to leave some of the prepping solution back to take advantage of its long term bactericidal properties.   Illustration of the posterior view of the lumbar spine and the posterior neural structures. Laminae of L2 through S1 are labeled. DPRL5, dorsal primary ramus of L5; DPRS1, dorsal primary ramus of S1; DPR3, dorsal primary  ramus of L3; FJ, facet (zygapophyseal) joint L3-L4; I, inferior articular process of L4; LB1, lateral branch of dorsal primary ramus of L1; IAB, inferior articular branches from L3 medial branch (supplies L4-L5 facet joint); IBP, intermediate branch plexus; MB3, medial branch of dorsal primary ramus of L3; NR3, third lumbar nerve root; S, superior articular process of L5; SAB, superior articular branches from L4 (  supplies L4-5 facet joint also); TP3, transverse process of L3.  Vitals:   05/21/20 1219 05/21/20 1226 05/21/20 1230 05/21/20 1234  BP: (!) 173/99 (!) 193/102 (!) 193/102 (!) (P) 158/94  Pulse: 76 71    Resp: 17 19 19  (P) 20  Temp:      SpO2: 100% 100% 99% (P) 100%  Weight:      Height:         Start Time: 1218 hrs. End Time: 1233 hrs.  Imaging Guidance (Spinal):          Type of Imaging Technique: Fluoroscopy Guidance (Spinal) Indication(s): Assistance in needle guidance and placement for procedures requiring needle placement in or near specific anatomical locations not easily accessible without such assistance. Exposure Time: Please see nurses notes. Contrast: None used. Fluoroscopic Guidance: I was personally present during the use of fluoroscopy. "Tunnel Vision Technique" used to obtain the best possible view of the target area. Parallax error corrected before commencing the procedure. "Direction-depth-direction" technique used to introduce the needle under continuous pulsed fluoroscopy. Once target was reached, antero-posterior, oblique, and lateral fluoroscopic projection used confirm needle placement in all planes. Images permanently stored in EMR. Interpretation: No contrast injected. I personally interpreted the imaging intraoperatively. Adequate needle placement confirmed in multiple planes. Permanent images saved into the patient's record.  Antibiotic Prophylaxis:   Anti-infectives (From admission, onward)   None     Indication(s): None identified  Post-operative  Assessment:  Post-procedure Vital Signs:  Pulse/HCG Rate: 71(P) 70 Temp: (!) 97.3 F (36.3 C) Resp: (P) 20 BP: (!) (P) 158/94 SpO2: (P) 100 %  EBL: None  Complications: No immediate post-treatment complications observed by team, or reported by patient.  Note: The patient tolerated the entire procedure well. A repeat set of vitals were taken after the procedure and the patient was kept under observation following institutional policy, for this type of procedure. Post-procedural neurological assessment was performed, showing return to baseline, prior to discharge. The patient was provided with post-procedure discharge instructions, including a section on how to identify potential problems. Should any problems arise concerning this procedure, the patient was given instructions to immediately contact us, at any time, without hesitation. In any case, we plan to contact the patient by telephone for a follow-up status report regarding this interventional procedure.  Comments:  No additional relevant information.  Plan of Care  Orders:  Orders Placed This Encounter  Procedures  . DG PA[facility] C-ARM 1-60 MIN NO REPORT    Intraoperative interpretation by procedural physician at [place].    Standing Status:   Standing    Number of Occurrences:   1    Order Specific Question:   Reason for exam:    Answer:   Assistance in needle guidance and placement for procedures requiring needle placement in or near specific anatomical locations not easily accessible without such assistance.   Patient instructed to restart Plavix tomorrow so long as she is not having any NEW lower extremity weakness.  Medications ordered for procedure: Meds ordered this encounter  Medications  . iohexol (OMNIPAQUE) 180 MG/ML injection 10 mL    Must be Myelogram-compatible. If not available, you may substitute with a water-soluble, non-ionic, hypoallergenic, myelogram-compatible radiological contrast medium.    Marland Kitchen lidocaine (XYLOCAINE) 2 % (with pres) injection 400 mg  . DISCONTD: fentaNYL (SUBLIMAZE) injection 25-50 mcg    Make sure Narcan is available in the pyxis when using this medication. In the event of respiratory depression (RR< 8/min): Titrate NARCAN (  naloxone) in increments of 0.1 to 0.2 mg IV at 2-3 minute intervals, until desired degree of reversal.  . midazolam (VERSED) 5 MG/5ML injection 1-2 mg    Make sure Flumazenil is available in the pyxis when using this medication. If oversedation occurs, administer 0.2 mg IV over 15 sec. If after 45 sec no response, administer 0.2 mg again over 1 min; may repeat at 1 min intervals; not to exceed 4 doses (1 mg)  . ropivacaine (PF) 2 mg/mL (0.2%) (NAROPIN) injection 9 mL  . dexamethasone (DECADRON) injection 10 mg  . dexamethasone (DECADRON) injection 10 mg  . ropivacaine (PF) 2 mg/mL (0.2%) (NAROPIN) injection 9 mL  . diazepam (VALIUM) tablet 5 mg   Medications administered: We administered lidocaine, ropivacaine (PF) 2 mg/mL (0.2%), dexamethasone, dexamethasone, ropivacaine (PF) 2 mg/mL (0.2%), and diazepam.  See the medical record for exact dosing, route, and time of administration.  Follow-up plan:   Return in about 4 weeks (around 06/18/2020) for in person.      L3-L5 facet blocks #1 04/09/2020, #2 05/21/20   Recent Visits Date Type Provider Dept  05/08/20 Telemedicine Gillis Santa, MD [facility]  04/09/20 Procedure visit Gillis Santa, MD [facility]  03/27/20 Office Visit Gillis Santa, MD [facility]  Showing recent visits within past 90 days and meeting all other requirements Today's Visits Date Type Provider Dept  05/21/20 Procedure visit Gillis Santa, MD [facility]  Showing today's visits and meeting all other requirements Future Appointments Date Type Provider Dept  06/19/20 Appointment Gillis Santa, MD [facility]  Showing future appointments within next 90 days and meeting  all other requirements  Disposition: Discharge home  Discharge (Date  Time): 05/21/2020;   hrs.   Primary Care Physician: Crecencio Mc, MD Location: [facility] Note by: Gillis Santa, MD Date: 05/21/2020; Time: 1:35 PM  Disclaimer:  Medicine is not an exact science. The only guarantee in medicine is that nothing is guaranteed. It is important to note that the decision to proceed with this intervention was based on the information collected from the patient. The Data and conclusions were drawn from the patient's questionnaire, the interview, and the physical examination. Because the information was provided in large part by the patient, it cannot be guaranteed that it has not been purposely or unconsciously manipulated. Every effort has been made to obtain as much relevant data as possible for this evaluation. It is important to note that the conclusions that lead to this procedure are derived in large part from the available data. Always take into account that the treatment will also be dependent on availability of resources and existing treatment guidelines, considered by other Pain Management Practitioners as being common knowledge and practice, at the time of the intervention. For Medico-Legal purposes, it is also important to point out that variation in procedural techniques and pharmacological choices are the acceptable norm. The indications, contraindications, technique, and results of the above procedure should only be interpreted and judged by a Board-Certified Interventional Pain Specialist with extensive familiarity and expertise in the same exact procedure and technique.

## 2020-05-21 NOTE — Patient Instructions (Addendum)

## 2020-05-21 NOTE — Progress Notes (Signed)
Safety precautions to be maintained throughout the outpatient stay will include: orient to surroundings, keep bed in low position, maintain call bell within reach at all times, provide assistance with transfer out of bed and ambulation.  

## 2020-06-12 ENCOUNTER — Other Ambulatory Visit: Payer: Self-pay | Admitting: Internal Medicine

## 2020-06-12 MED ORDER — NORTRIPTYLINE HCL 10 MG PO CAPS: 10.0000 mg | ORAL_CAPSULE | Freq: Two times a day (BID) | ORAL | 1 refills | Status: DC

## 2020-06-12 NOTE — Telephone Encounter (Signed)
I refilled the nortriptyline. Needs a refill on the Librium.   Refilled: 08/07/2019 Last OV: 05/05/2020 Next OV: not scheduled

## 2020-06-19 ENCOUNTER — Encounter: Payer: Self-pay | Admitting: Student in an Organized Health Care Education/Training Program

## 2020-06-19 ENCOUNTER — Other Ambulatory Visit: Payer: Self-pay

## 2020-06-19 ENCOUNTER — Ambulatory Visit
Payer: Medicare Other | Attending: Student in an Organized Health Care Education/Training Program | Admitting: Student in an Organized Health Care Education/Training Program

## 2020-06-19 VITALS — BP 186/71 | HR 77 | Temp 97.3°F | Resp 18 | Ht 61.0 in | Wt 162.0 lb

## 2020-06-19 DIAGNOSIS — G894 Chronic pain syndrome: Secondary | ICD-10-CM | POA: Insufficient documentation

## 2020-06-19 DIAGNOSIS — M47816 Spondylosis without myelopathy or radiculopathy, lumbar region: Secondary | ICD-10-CM | POA: Diagnosis not present

## 2020-06-19 NOTE — Patient Instructions (Addendum)
Preparing for Procedure with Sedation Instructions: . Oral Intake: Do not eat or drink anything for at least 8 hours prior to your procedure. . Transportation: Public transportation is not allowed. Bring an adult driver. The driver must be physically present in our waiting room before any procedure can be started. Marland Kitchen Physical Assistance: Bring an adult capable of physically assisting you, in the event you need help. . Blood Pressure Medicine: Take your blood pressure medicine with a sip of water the morning of the procedure. . Insulin: Take only  of your normal insulin dose. . Preventing infections: Shower with an antibacterial soap the morning of your procedure. . Build-up your immune system: Take 1000 mg of Vitamin C with every meal (3 times a day) the day prior to your procedure. . Pregnancy: If you are pregnant, call and cancel the procedure. . Sickness: If you have a cold, fever, or any active infections, call and cancel the procedure. . Arrival: You must be in the facility at least 30 minutes prior to your scheduled procedure. . Children: Do not bring children with you. . Dress appropriately: Bring dark clothing that you would not mind if they get stained. . Valuables: Do not bring any jewelry or valuables. Procedure appointments are reserved for interventional treatments only. Marland Kitchen No Prescription Refills. . No medication changes will be discussed during procedure appointments. . No disability issues will be discussed.  Radiofrequency Lesioning Radiofrequency lesioning is a procedure that is performed to relieve pain. The procedure is often used for back, neck, or arm pain. Radiofrequency lesioning involves the use of a machine that creates radio waves to make heat. During the procedure, the heat is applied to the nerve that carries the pain signal. The heat damages the nerve and interferes with the pain signal. Pain relief usually starts about 2 weeks after the procedure and lasts for 6  months to 1 year. You will be awake during the procedure. You will need to be able to talk with the health care provider during the procedure. Tell a health care provider about: Any allergies you have. All medicines you are taking, including vitamins, herbs, eye drops, creams, and over-the-counter medicines. Any problems you or family members have had with anesthetic medicines. Any blood disorders you have. Any surgeries you have had. Any medical conditions you have or have had. Whether you are pregnant or may be pregnant. What are the risks? Generally, this is a safe procedure. However, problems may occur, including: Pain or soreness at the injection site. Allergic reaction to medicines given during the procedure. Bleeding. Infection at the injection site. Damage to nerves or blood vessels. What happens before the procedure? Staying hydrated Follow instructions from your health care provider about hydration, which may include: Up to 2 hours before the procedure - you may continue to drink clear liquids, such as water, clear fruit juice, black coffee, and plain tea. Eating and drinking Follow instructions from your health care provider about eating and drinking, which may include: 8 hours before the procedure - stop eating heavy meals or foods, such as meat, fried foods, or fatty foods. 6 hours before the procedure - stop eating light meals or foods, such as toast or cereal. 6 hours before the procedure - stop drinking milk or drinks that contain milk. 2 hours before the procedure - stop drinking clear liquids. Medicines Ask your health care provider about: Changing or stopping your regular medicines. This is especially important if you are taking diabetes medicines or blood thinners.  Taking medicines such as aspirin and ibuprofen. These medicines can thin your blood. Do not take these medicines unless your health care provider tells you to take them. Taking over-the-counter medicines,  vitamins, herbs, and supplements. General instructions Plan to have someone take you home from the hospital or clinic. If you will be going home right after the procedure, plan to have someone with you for 24 hours. Ask your health care provider what steps will be taken to help prevent infection. These may include: Removing hair at the procedure site. Washing skin with a germ-killing soap. Taking antibiotic medicine. What happens during the procedure?  An IV will be inserted into one of your veins. You will be given one or more of the following: A medicine to help you relax (sedative). A medicine to numb the area (local anesthetic). Your health care provider will insert a radiofrequency needle into the area to be treated. This is done with the help of a type of X-ray (fluoroscopy). A wire that carries the radio waves (electrode) will be put through the radiofrequency needle. An electrical pulse will be sent through the electrode to verify the correct nerve that is causing your pain. You will feel a tingling sensation, and you may have muscle twitching. The tissue around the needle tip will be heated by an electric current that comes from the radiofrequency machine. This will numb the nerves. The needle will be removed. A bandage (dressing) will be put on the insertion area. The procedure may vary among health care providers and hospitals. What happens after the procedure? Your blood pressure, heart rate, breathing rate, and blood oxygen level will be monitored until you leave the hospital or clinic. Return to your normal activities as told by your health care provider. Ask your health care provider what activities are safe for you. Do not drive for 24 hours if you were given a sedative during your procedure. Summary Radiofrequency lesioning is a procedure that is performed to relieve pain. The procedure is often used for back, neck, or arm pain. Radiofrequency lesioning involves the use of  a machine that creates radio waves to make heat. Plan to have someone take you home from the hospital or clinic. Do not drive for 24 hours if you were given a sedative during your procedure. Return to your normal activities as told by your health care provider. Ask your health care provider what activities are safe for you. This information is not intended to replace advice given to you by your health care provider. Make sure you discuss any questions you have with your health care provider. Document Revised: 06/29/2018 Document Reviewed: 06/29/2018 Elsevier Patient Education  2020 [place] also get to know who is . Calls

## 2020-06-19 NOTE — Progress Notes (Signed)
Safety precautions to be maintained throughout the outpatient stay will include: orient to surroundings, keep bed in low position, maintain call bell within reach at all times, provide assistance with transfer out of bed and ambulation.  

## 2020-06-19 NOTE — Progress Notes (Signed)
PROVIDER NOTE: Information contained herein reflects review and annotations entered in association with encounter. Interpretation of such information and data should be left to medically-trained personnel. Information provided to patient can be located elsewhere in the medical record under "Patient Instructions". Document created using STT-dictation technology, any transcriptional errors that may result from process are unintentional.    Patient: Sabrina [name]  Service Category: E/M  Provider: Gillis Santa, MD  DOB: 09-Jun-1941  DOS: 06/19/2020  Specialty: Interventional Pain Management  MRN: [id number]  Setting: Ambulatory outpatient  PCP: Crecencio Mc, MD  Type: Established Patient    Referring Provider: Crecencio Mc, MD  Location: Office  Delivery: Face-to-face     HPI  Reason for encounter: Sabrina [name]. Sabrina [name], a 79 y.o. year old female, is here today for evaluation and management of her Lumbar spondylosis [M47.816]. Sabrina [name]. Sabrina [name] primary complain today is Back Pain (low) Last encounter: Practice (05/21/2020). My last encounter with her was on 05/21/2020. Pertinent problems: Sabrina [name]. Sabrina [name] has Hip pain, bilateral; Peripheral vascular disease ([place]); Generalized anxiety disorder; Chronic right hip pain; Cervical spondylosis without myelopathy; Chronic radicular lumbar pain; Lumbar stenosis with neurogenic claudication; Osteoarthritis of right hip; Chronic pain syndrome; and Lumbar facet arthropathy on their pertinent problem list. Pain Assessment: Severity of Chronic pain is reported as a 10-Worst pain ever (when walking)/10. Location: Back Lower/radiates around to left groin. Onset: More than a month ago. Quality: Aching. Timing: Constant. Modifying factor(s): sitting. Vitals:  height is $RemoveB'5\' 1"'fzqSPGfg$  (1.549 m) and weight is 162 lb (73.5 kg). Her temperature is 97.3 F (36.3 C) (abnormal). Her blood pressure is 186/71 (abnormal) and her pulse is 77. Her respiration is 18 and oxygen saturation is  100%.    Post-Procedure Evaluation  Procedure (05/21/2020):   Type: Lumbar Facet, Medial Branch Block(s) #2  Primary Purpose: Diagnostic Region: Posterolateral Lumbosacral Spine Level:L3, L4, L5,  Medial Branch Level(s). Injecting these levels blocks the L3-4, L4-5,  lumbar facet joints. Laterality: Bilateral  Sedation: Please see nurses note.  Effectiveness during initial hour after procedure(Ultra-Short Term Relief): 100 %   Local anesthetic used: Long-acting (4-6 hours) Effectiveness: Defined as any analgesic benefit obtained secondary to the administration of local anesthetics. This carries significant diagnostic value as to the etiological location, or anatomical origin, of the pain. Duration of benefit is expected to coincide with the duration of the local anesthetic used.  Effectiveness during initial 4-6 hours after procedure(Short-Term Relief): 100 %   Long-term benefit: Defined as any relief past the pharmacologic duration of the local anesthetics.  Effectiveness past the initial 6 hours after procedure(Long-Term Relief): 100 % (lasted 1 week)   Current benefits: Defined as benefit that persist at this time.   Analgesia:  Back to baseline Function: Back to baseline ROM: Back to baseline   ROS  Constitutional: Denies any fever or chills Gastrointestinal: No reported hemesis, hematochezia, vomiting, or acute GI distress Musculoskeletal: Low back, bilateral hip pain Neurological: No reported episodes of acute onset apraxia, aphasia, dysarthria, agnosia, amnesia, paralysis, loss of coordination, or loss of consciousness  Medication Review  Omega 3, aspirin, chlordiazePOXIDE, clopidogrel, ezetimibe, fentaNYL, ferrous sulfate, hydrALAZINE, labetalol, lisinopril, naloxone, nortriptyline, pantoprazole, and traMADol  History Review  Allergy: Sabrina [name]. Sabrina [name] is allergic to citalopram, dilaudid [hydromorphone hcl], hydrochlorothiazide, nsaids, nubain [nalbuphine hcl], penicillins,  prasugrel, and statins. Drug: Sabrina [name]. Sabrina [name]  reports no history of drug use. Alcohol:  reports no history of alcohol use. Tobacco:  reports that she quit smoking about 21 years ago. She has  never used smokeless tobacco. Social: Sabrina [name]. Sabrina [name]  reports that she quit smoking about 21 years ago. She has never used smokeless tobacco. She reports that she does not drink alcohol and does not use drugs. 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 Surgical: Sabrina [name]. Sabrina [name]  has a past surgical history that includes Cholecystectomy; Tonsillectomy and adenoidectomy; Abdominal hysterectomy (1976); Total hip arthroplasty (Left); Carotid angioplasty (Left); Coronary angioplasty with stent (2000); Toe amputation (Right); Cystoscopy with stent placement (Bilateral); Total hip arthroplasty (Right, 2015); Coronary artery bypass graft (2000); Carotid endarterectomy (Left); Renal artery angioplasty (Bilateral, mARCH 2015); Colonoscopy with propofol (N/A, 08/16/2017); Esophagogastroduodenoscopy (egd) with propofol (N/A, 08/16/2017); Joint replacement (Bilateral, 2000); and Esophagogastroduodenoscopy (egd) with propofol (N/A, 06/29/2018). Family: family history includes Breast cancer (age of onset: 104) in her paternal aunt; Cerebral aneurysm in her son and son; Diabetes in her mother; Heart disease in her sister; Hypertension in her father and mother; Multiple sclerosis in her daughter and son; Seizures in her son; Stroke in her mother.  Laboratory Chemistry Profile   Renal Lab Results  Component Value Date   BUN 19 03/10/2020   CREATININE 1.21 (H) 03/10/2020   LABCREA 86 03/06/2018   GFR 51.74 (L)  12/21/2018   GFRAA 50 (L) 03/10/2020   GFRNONAA 43 (L) 03/10/2020     Hepatic Lab Results  Component Value Date   AST 17 03/10/2020   ALT 13 03/10/2020   ALBUMIN 3.7 03/10/2020   ALKPHOS 62 03/10/2020   LIPASE 16.0 07/06/2017     Electrolytes Lab Results  Component Value Date   NA 137 03/10/2020   K 4.6 03/10/2020   CL 102 03/10/2020   CALCIUM 9.3 03/10/2020     Bone No results found for: VD25OH, VD125OH2TOT, KD9833AS5, KN3976BH4, 25OHVITD1, 25OHVITD2, 25OHVITD3, TESTOFREE, TESTOSTERONE   Inflammation (CRP: Acute Phase) (ESR: Chronic Phase) Lab Results  Component Value Date   CRP 0.2 (L) 02/22/2018   ESRSEDRATE 24 02/22/2018       Note: Above Lab results reviewed.  Recent Imaging Review  DG PAIN CLINIC C-ARM 1-60 MIN NO REPORT Fluoro was used, but no Radiologist interpretation will be provided.  Please refer to "NOTES" tab for provider progress note. Note: Reviewed        Physical Exam  General appearance: Well nourished, well developed, and well hydrated. In no apparent acute distress Mental status: Alert, oriented x 3 (person, place, & time)       Respiratory: No evidence of acute respiratory distress Eyes: PERLA Vitals: BP (!) 186/71   Pulse 77   Temp (!) 97.3 F (36.3 C)   Resp 18   Ht $R'5\' 1"'OL$  (1.549 m)   Wt 162 lb (73.5 kg)   SpO2 100%   BMI 30.61 kg/m  BMI: Estimated body mass index is 30.61 kg/m as calculated from the following:   Height as of this encounter: $RemoveBeforeD'5\' 1"'yaxgjefZopgJgF$  (1.549 m).   Weight as of this encounter: 162 lb (73.5 kg). Ideal: Ideal body weight: 47.8 kg (105 lb 6.1 oz) Adjusted ideal body weight: 58.1 kg (128 lb 0.5 oz)    Lumbar Exam  Skin & Axial Inspection: No masses, redness, or swelling Alignment: Symmetrical Functional ROM: Decreased ROM affecting both sides Stability: No instability detected Muscle Tone/Strength: Functionally intact.  No obvious neuro-muscular anomalies detected. Sensory (Neurological): Musculoskeletal pain  pattern Palpation: No palpable anomalies       Provocative Tests: Hyperextension/rotation test: (+) bilaterally for facet joint pain. Lumbar quadrant test (Kemp's test): (+) bilaterally for facet joint pain. Patrick's Maneuver: deferred today                   FABER* test: deferred today                   S-I anterior distraction/compression test: (+)   S-I arthralgia/arthropathy S-I lateral compression test: deferred today         S-I Thigh-thrust test: deferred today         S-I Gaenslen's test: deferred today         *(Flexion, ABduction and External Rotation)  Gait & Posture Assessment  Ambulation: Limited Gait: Antalgic Posture: Difficulty standing up straight, due to pain   Lower Extremity Exam    Side: Right lower extremity  Side: Left lower extremity  Stability: No instability observed          Stability: No instability observed          Skin & Extremity Inspection: Evidence of prior arthroplastic surgery  Skin & Extremity Inspection: Evidence of prior arthroplastic surgery  Functional ROM: Pain restricted ROM for all joints of the lower extremity          Functional ROM: Pain restricted ROM for all joints of the lower extremity          Muscle Tone/Strength: Functionally intact. No obvious neuro-muscular anomalies detected.  Muscle Tone/Strength: Functionally intact. No obvious neuro-muscular anomalies detected.  Sensory (Neurological): Musculoskeletal pain pattern        Sensory (Neurological): Musculoskeletal pain pattern        DTR: Patellar: deferred today Achilles: deferred today Plantar: deferred today  DTR: Patellar: deferred today Achilles: deferred today Plantar: deferred today  Palpation: No palpable anomalies  Palpation: No palpable anomalies     Assessment   Status Diagnosis  Persistent Persistent Persistent 1. Lumbar spondylosis   2. Lumbar facet arthropathy   3. Chronic pain syndrome      Updated Problems: Problem  Chronic Pain  Syndrome  Lumbar Facet Arthropathy  Cervical Spondylosis Without Myelopathy   She has degenerative changes with spinal stenosis of varying degrees from c4 to c7 by 2016 MRI   Chronic Radicular Lumbar Pain  Lumbar Stenosis With Neurogenic Claudication  Chronic Right Hip Pain  Generalized Anxiety Disorder  Peripheral vascular disease ([facility])   S/p l carotid endarterectomy S/p bilateral renal artery angioplasty and stent 2015    Osteoarthritis of Right Hip   S.p bilateral hip prostheses   Hip Pain, Bilateral   Extensive degenerative changes noted on  Right hip films and s/p l hip prothesis      Plan of Care   Patient is status post 2 series of diagnostic lumbar facet medial branch nerve blocks at L3, L4, L5.  With her most recent 1, she experienced approximately 50% pain relief for 5 days with return of pain.  Patient has a difficult time tolerating the procedure.  She has a significant hyperalgesic response to needle placement.  We have limited options for her from an interventional standpoint.  I did offer the patient lumbar radiofrequency ablation for the purpose of obtaining longer-term benefits.  Risks and benefits were reviewed.  I informed the patient that generally after ablating the nerves, patients can see up to 6 months of  pain relief however there is no guarantee.  I wanted to be realistic in terms of expectations for her as she does carry high risk of stopping Plavix for interventional spine procedures in the context of her peripheral vascular disease and stent history.  Patient states that she will think about lumbar radiofrequency ablation and if she would like to proceed she will let us know.  Risk and benefits of this procedure were discussed in great detail.  Patient informed that she will need to stop Plavix 7 days prior to procedure if she follow through's with it.  As needed order placed.   Orders:  Orders Placed This Encounter  Procedures  . Radiofrequency,Lumbar     Scheduling timeframe: (PRN procedure) Sabrina [name]. Ogan will call when needed. Clinical indication: Axial low back pain. Lumbosacral Spondylosis (M47.897).  Sedation: Usually done with sedation. (May be done without sedation if so desired by patient.) Requirements: NPO x 8 hrs.; Driver; Stop blood thinners.    Standing Status:   Standing    Number of Occurrences:   1    Standing Expiration Date:   06/19/2021    Scheduling Instructions:     Procedure: Lumbar Facet Joint Medial Branch Radiofrequency Ablation (RFA)     Level: L3-4, L4-5, & L5-S1 Facets (L2, L3, L4, L5, & S1 Medial Branch Nerves)     Laterality: Left-sided     Stop Plavix for 7 days prior    Order Specific Question:   Where will this procedure be performed?    Answer:   [facility]   Follow-up plan:   Return if symptoms worsen or fail to improve.     L3-L5 facet blocks #1 04/09/2020, #2 05/21/20: 50% pain relief for approximately 5 days.  Lumbar RFA as needed.    Recent Visits Date Type Provider Dept  05/21/20 Procedure visit Gillis Santa, MD [facility]  05/08/20 Telemedicine Gillis Santa, MD [facility]  04/09/20 Procedure visit Gillis Santa, MD [facility]  03/27/20 Office Visit Gillis Santa, MD [facility]  Showing recent visits within past 90 days and meeting all other requirements Today's Visits Date Type Provider Dept  06/19/20 Office Visit Gillis Santa, MD [facility]  Showing today's visits and meeting all other requirements Future Appointments No visits were found meeting these conditions. Showing future appointments within next 90 days and meeting all other requirements  I discussed the assessment and treatment plan with the patient. The patient was provided an opportunity to ask questions and all were answered. The patient agreed with the plan and demonstrated an understanding of the instructions.  Patient advised to call back or seek an in-person  evaluation if the symptoms or condition worsens.  Duration of encounter: 30 minutes.  Note by: Gillis Santa, MD Date: 06/19/2020; Time: 8:32 AM

## 2020-06-27 ENCOUNTER — Other Ambulatory Visit: Payer: Self-pay | Admitting: Internal Medicine

## 2020-06-27 MED ORDER — LISINOPRIL 20 MG PO TABS: 20.0000 mg | ORAL_TABLET | Freq: Two times a day (BID) | ORAL | 1 refills | Status: DC

## 2020-07-03 ENCOUNTER — Other Ambulatory Visit: Payer: Self-pay

## 2020-07-03 ENCOUNTER — Other Ambulatory Visit: Payer: Self-pay | Admitting: Internal Medicine

## 2020-07-03 DIAGNOSIS — I10 Essential (primary) hypertension: Secondary | ICD-10-CM

## 2020-07-03 MED ORDER — LABETALOL HCL 200 MG PO TABS: 200.0000 mg | ORAL_TABLET | Freq: Two times a day (BID) | ORAL | 1 refills | Status: DC

## 2020-07-03 MED ORDER — ZETIA 10 MG PO TABS
10.0000 mg | ORAL_TABLET | Freq: Every day | ORAL | 0 refills | Status: DC
Start: 2020-07-03 — End: 2020-09-05

## 2020-07-04 ENCOUNTER — Other Ambulatory Visit: Payer: Self-pay | Admitting: Internal Medicine

## 2020-07-04 MED ORDER — FENTANYL 50 MCG/HR TD PT72
1.0000 | MEDICATED_PATCH | TRANSDERMAL | 0 refills | Status: DC
Start: 2020-07-04 — End: 2020-08-18

## 2020-07-15 ENCOUNTER — Encounter: Payer: Self-pay | Admitting: Internal Medicine

## 2020-07-22 ENCOUNTER — Telehealth: Payer: Self-pay | Admitting: Internal Medicine

## 2020-07-22 NOTE — Telephone Encounter (Signed)
error 

## 2020-07-22 NOTE — Telephone Encounter (Signed)
It should be in person so orthostatics can be checked,  Provided she has no covid symptoms.  The dizziness is a chronic complaint

## 2020-07-22 NOTE — Telephone Encounter (Signed)
noted 

## 2020-07-22 NOTE — Telephone Encounter (Signed)
Pt is reporting dizziness and not feeling well. I went to transfer to access nurse and patient hung up.

## 2020-07-22 NOTE — Telephone Encounter (Signed)
Spoke with pt and she stated that she needs to see Dr. Derrel Nip. She stated that she has "just not been feeling good". Asked pt what she means by not feeling good and she stated that she has been having some dizziness and just doesn't feel like getting out of bed.Pt thinks that her iron is possibly low. Pt stated that it has been going on for several weeks. Pt also stated that she is having no other symptoms. I scheduled pt for an appt on Thursday. Does this appt need to be virtual or in the office?

## 2020-07-24 ENCOUNTER — Other Ambulatory Visit: Payer: Self-pay

## 2020-07-24 ENCOUNTER — Ambulatory Visit (INDEPENDENT_AMBULATORY_CARE_PROVIDER_SITE_OTHER): Payer: Medicare Other | Admitting: Internal Medicine

## 2020-07-24 ENCOUNTER — Ambulatory Visit
Admission: RE | Admit: 2020-07-24 | Discharge: 2020-07-24 | Disposition: A | Discharge status: Home / Self Care | Payer: Medicare Other | Source: Ambulatory Visit | Attending: Internal Medicine | Admitting: Internal Medicine

## 2020-07-24 ENCOUNTER — Encounter: Payer: Self-pay | Admitting: Internal Medicine

## 2020-07-24 VITALS — BP 152/84 | HR 65 | Temp 98.2°F | Resp 15 | Ht 61.0 in | Wt 165.0 lb

## 2020-07-24 DIAGNOSIS — E538 Deficiency of other specified B group vitamins: Secondary | ICD-10-CM

## 2020-07-24 DIAGNOSIS — E782 Mixed hyperlipidemia: Secondary | ICD-10-CM | POA: Diagnosis not present

## 2020-07-24 DIAGNOSIS — Z1231 Encounter for screening mammogram for malignant neoplasm of breast: Secondary | ICD-10-CM | POA: Insufficient documentation

## 2020-07-24 DIAGNOSIS — F32A Depression, unspecified: Secondary | ICD-10-CM

## 2020-07-24 DIAGNOSIS — R5383 Other fatigue: Secondary | ICD-10-CM

## 2020-07-24 DIAGNOSIS — I714 Abdominal aortic aneurysm, without rupture, unspecified: Secondary | ICD-10-CM

## 2020-07-24 DIAGNOSIS — I701 Atherosclerosis of renal artery: Secondary | ICD-10-CM | POA: Diagnosis not present

## 2020-07-24 DIAGNOSIS — F329 Major depressive disorder, single episode, unspecified: Secondary | ICD-10-CM

## 2020-07-24 DIAGNOSIS — D509 Iron deficiency anemia, unspecified: Secondary | ICD-10-CM | POA: Diagnosis not present

## 2020-07-24 DIAGNOSIS — F1190 Opioid use, unspecified, uncomplicated: Secondary | ICD-10-CM

## 2020-07-24 DIAGNOSIS — Z23 Encounter for immunization: Secondary | ICD-10-CM

## 2020-07-24 MED ORDER — ACETAMINOPHEN-CODEINE #4 300-60 MG PO TABS: 1.0000 | ORAL_TABLET | ORAL | 0 refills | Status: DC | PRN

## 2020-07-24 NOTE — Patient Instructions (Addendum)
Ok to try using the librium at bedtime to help you  Relax and go to sleep  I have added the tylenol with codeine to use AS NEEDED with your pain patches   Ok to use miralax  And stool softener (docusate) every daily  Add sennakot ,  dulcolax or ex lax not more than every  2 to 3 days   If your labs are normal, I suggest treating you for depression

## 2020-07-24 NOTE — Progress Notes (Signed)
Subjective:  Patient ID: Sabrina [name], female    DOB: 06-Feb-1941  Age: 79 y.o. MRN: [id number]  CC: The primary encounter diagnosis was Iron deficiency anemia, unspecified iron deficiency anemia type. 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  HPI Sabrina [name] presents for follow up on chronic pain managed with duragesic transdermal patches,  Chronic dizziness  /This visit occurred during the SARS-CoV-2 public health emergency.  Safety protocols were in place, including screening questions prior to the visit, additional usage of staff PPE, and extensive cleaning of exam room while observing appropriate contact time as indicated for disinfecting solutions.   Cc:  She reports acute on chronic fatigue and generalized weakness,  Episodic dizziness without true vertigo  Orthostatic bp measurements note a drop in 40 pts in systolic from 627 to 035 from  sitting to standing without change in pulse.  No symptoms elicited with change in position . Home readings of BP have been done regularly and are reported as systolic 009 to 381 . She feels that "something is too low in my body"  For the past 2 weeks.  Spending most of the day in bed because of back pain and   She reports constipation .  Does not use anything regularly   She is requesting a change in medication from duragesic patches to Tylenol #3 (codeine).  She states that her pain is not well controlled on current regimen   Outpatient Medications Prior to Visit  Medication Sig Dispense Refill  . aspirin 81 MG tablet Take 81 mg by mouth daily.    . chlordiazePOXIDE (LIBRIUM) 25 MG capsule TAKE 1 CAPSULE BY MOUTH ONCE DAILY AS NEEDED FOR ANXIETY 30 capsule 5  . clopidogrel (PLAVIX)  75 MG tablet Take 1 tablet by mouth once daily 90 tablet 0  . fentaNYL (DURAGESIC) 50 MCG/HR Place 1 patch onto the skin every 3 (three) days. 10 patch 0  . ferrous sulfate (FEROSUL) 325 (65 FE) MG tablet Take 1 tablet (325 mg total) by mouth daily with breakfast. 90 tablet 0  . hydrALAZINE (APRESOLINE) 50 MG tablet TAKE 1 TABLET BY MOUTH THREE TIMES DAILY AS NEEDED FOR BLOOD PRESSURE GREATER THAN 150 270 tablet 1  . labetalol (NORMODYNE) 200 MG tablet Take 1 tablet (200 mg total) by mouth 2 (two) times daily. 180 tablet 1  . lisinopril (ZESTRIL) 20 MG tablet Take 1 tablet (20 mg total) by mouth 2 (two) times daily. 180 tablet 1  . NARCAN 4 MG/0.1ML LIQD nasal spray kit     . nortriptyline (PAMELOR) 10 MG capsule Take 1 capsule (10 mg total) by mouth 2 (two) times daily. 180 capsule 1  . Omega 3 1000 MG CAPS Take 1 capsule by mouth 2 (two) times daily.     . pantoprazole (PROTONIX) 40 MG tablet Take 1 tablet (40 mg total) by mouth daily. On an empty stomach  For gastritis 30 tablet 3  . traMADol (ULTRAM) 50 MG tablet TAKE 2 TABLETS BY MOUTH EVERY 12 HOURS AS NEEDED. MAX OF 4 TABLETS DAILY. 120 tablet 0  . ZETIA 10 MG tablet Take 1 tablet (10 mg total) by mouth daily. 30 tablet 0  . fentaNYL (DURAGESIC) 50 MCG/HR Place 1 patch onto the skin every 3 (three) days. 10 patch 0  No facility-administered medications prior to visit.    Review of Systems;  Patient denies headache, fevers, malaise, unintentional weight loss, skin rash, eye pain, sinus congestion and sinus pain, sore throat, dysphagia,  hemoptysis , cough, dyspnea, wheezing, chest pain, palpitations, orthopnea, edema, abdominal pain, nausea, melena, diarrhea, constipation, flank pain, dysuria, hematuria, urinary  Frequency, nocturia, numbness, tingling, seizures,  Focal weakness, Loss of consciousness,  Tremor, insomnia, depression, anxiety, and suicidal ideation.      Objective:  BP (!) 152/84 (BP Location: Right Arm, Patient  Position: Sitting, Cuff Size: Normal)   Pulse 65   Temp 98.2 F (36.8 C) (Oral)   Resp 15   Ht $R'5\' 1"'Pm$  (1.549 m)   Wt 165 lb (74.8 kg)   SpO2 99%   BMI 31.18 kg/m   BP Readings from Last 3 Encounters:  07/24/20 (!) 152/84  06/19/20 (!) 186/71  05/21/20 (!) (P) 158/94    Wt Readings from Last 3 Encounters:  07/24/20 165 lb (74.8 kg)  06/19/20 162 lb (73.5 kg)  05/21/20 162 lb (73.5 kg)    General appearance: alert, cooperative and appears stated age Ears: normal TM's and external ear canals both ears Throat: lips, mucosa, and tongue normal; teeth and gums normal Neck: no adenopathy, no carotid bruit, supple, symmetrical, trachea midline and thyroid not enlarged, symmetric, no tenderness/mass/nodules Back: symmetric, no curvature. ROM normal. No CVA tenderness. Lungs: clear to auscultation bilaterally Heart: regular rate and rhythm, S1, S2 normal, no murmur, click, rub or gallop Abdomen: soft, non-tender; bowel sounds normal; no masses,  no organomegaly Pulses: 2+ and symmetric Skin: Skin color, texture, turgor normal. No rashes or lesions Lymph nodes: Cervical, supraclavicular, and axillary nodes normal.  Lab Results  Component Value Date   HGBA1C 5.9 07/19/2016   HGBA1C 5.8 06/02/2015    Lab Results  Component Value Date   CREATININE 1.21 (H) 03/10/2020   CREATININE 1.04 (H) 09/14/2019   CREATININE 1.32 (H) 05/23/2019    Lab Results  Component Value Date   WBC 5.2 03/10/2020   HGB 10.8 (L) 03/10/2020   HCT 33.1 (L) 03/10/2020   PLT 236 03/10/2020   GLUCOSE 104 (H) 03/10/2020   CHOL 167 02/22/2018   TRIG 53.0 02/22/2018   HDL 54.80 02/22/2018   LDLDIRECT 109.0 03/05/2016   LDLCALC 102 (H) 02/22/2018   ALT 13 03/10/2020   AST 17 03/10/2020   NA 137 03/10/2020   K 4.6 03/10/2020   CL 102 03/10/2020   CREATININE 1.21 (H) 03/10/2020   BUN 19 03/10/2020   CO2 28 03/10/2020   TSH 4.46 03/06/2018   INR 1.0 03/27/2014   HGBA1C 5.9 07/19/2016    DG PAIN  CLINIC C-ARM 1-60 MIN NO REPORT  Result Date: 05/21/2020 Fluoro was used, but no Radiologist interpretation will be provided. Please refer to "NOTES" tab for provider progress note.   Assessment & Plan:   Problem List Items Addressed This Visit      Unprioritized   Atherosclerosis of renal artery (HCC)   HLD (hyperlipidemia)   Relevant Orders   Lipid panel   Anemia - Primary   Relevant Orders   CBC with Differential/Platelet   IBC + Ferritin   Aortic aneurysm, abdominal ([place])    Monitored annually with duplex by Dr Ronalee Belts.  Last seen in June.  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    Will recommend trial of cymbalta if screening labs for metabolic/hematologic causes are negative   Lab Results  Component Value Date   TSH 4.46 03/06/2018   Lab Results  Component Value Date   WBC 5.2 03/10/2020   HGB 10.8 (L) 03/10/2020   HCT 33.1 (L) 03/10/2020   MCV 89.2 03/10/2020   PLT 236 03/10/2020   Lab Results  Component Value Date   CREATININE 1.21 (H) 03/10/2020          Other Visit Diagnoses    Fatigue, unspecified type       Relevant Orders   Comprehensive metabolic panel   Magnesium   TSH   B12 deficiency       Relevant Orders   B12 and Folate Panel   Need for immunization against influenza       Relevant Orders   Flu Vaccine QUAD High Dose(Fluad) (Completed)      I am having Sabrina [name] start on acetaminophen-codeine. I am also having her maintain her aspirin, Omega 3, hydrALAZINE, ferrous sulfate, traMADol, Narcan, pantoprazole, chlordiazePOXIDE, nortriptyline, lisinopril, Zetia, labetalol, clopidogrel, fentaNYL, and fentaNYL.  Meds ordered this encounter  Medications  . acetaminophen-codeine (TYLENOL #4) 300-60 MG tablet    Sig: Take 1 tablet by mouth every 4 (four) hours  as needed for moderate pain.    Dispense:  30 tablet    Refill:  0  . fentaNYL (DURAGESIC) 50 MCG/HR    Sig: Place 1 patch onto the skin every 3 (three) days.    Dispense:  10 patch    Refill:  0    Transferring rx to you because the [facility] [address]  [facility] is out of medication    Medications Discontinued During This Encounter  Medication Reason  . fentaNYL (DURAGESIC) 50 MCG/HR     Follow-up: No follow-ups on file.   Crecencio Mc, MD

## 2020-07-26 DIAGNOSIS — F32A Depression, unspecified: Secondary | ICD-10-CM | POA: Insufficient documentation

## 2020-07-26 MED ORDER — FENTANYL 50 MCG/HR TD PT72
1.0000 | MEDICATED_PATCH | TRANSDERMAL | 0 refills | Status: DC
Start: 2020-08-03 — End: 2020-08-18

## 2020-07-26 NOTE — Assessment & Plan Note (Signed)
Monitored annually with duplex by Dr Ronalee Belts.  Last seen in June.  < 4 cm, asymptomatic

## 2020-07-26 NOTE — Assessment & Plan Note (Signed)
ahe has constant pain that has not improved with ESI   Continue duragesic  Stop tramadol and add Tylenol #4 for breakthrough pain

## 2020-07-26 NOTE — Assessment & Plan Note (Addendum)
Will recommend trial of cymbalta if screening labs for metabolic/hematologic causes are negative   Lab Results  Component Value Date   TSH 4.46 03/06/2018   Lab Results  Component Value Date   WBC 5.2 03/10/2020   HGB 10.8 (L) 03/10/2020   HCT 33.1 (L) 03/10/2020   MCV 89.2 03/10/2020   PLT 236 03/10/2020   Lab Results  Component Value Date   CREATININE 1.21 (H) 03/10/2020

## 2020-07-29 ENCOUNTER — Other Ambulatory Visit (INDEPENDENT_AMBULATORY_CARE_PROVIDER_SITE_OTHER): Payer: Medicare Other

## 2020-07-29 ENCOUNTER — Other Ambulatory Visit: Payer: Self-pay

## 2020-07-29 DIAGNOSIS — R5383 Other fatigue: Secondary | ICD-10-CM

## 2020-07-29 DIAGNOSIS — E782 Mixed hyperlipidemia: Secondary | ICD-10-CM

## 2020-07-29 DIAGNOSIS — D509 Iron deficiency anemia, unspecified: Secondary | ICD-10-CM

## 2020-07-29 DIAGNOSIS — E538 Deficiency of other specified B group vitamins: Secondary | ICD-10-CM

## 2020-07-30 ENCOUNTER — Encounter: Payer: Self-pay | Admitting: Internal Medicine

## 2020-07-30 ENCOUNTER — Other Ambulatory Visit: Payer: Self-pay | Admitting: Internal Medicine

## 2020-07-30 DIAGNOSIS — E039 Hypothyroidism, unspecified: Secondary | ICD-10-CM

## 2020-07-30 LAB — TSH: TSH: 10.69 u[IU]/mL — ABNORMAL HIGH (ref 0.35–4.50)

## 2020-07-30 LAB — COMPREHENSIVE METABOLIC PANEL
ALT: 13 U/L (ref 0–35)
AST: 16 U/L (ref 0–37)
Albumin: 4 g/dL (ref 3.5–5.2)
Alkaline Phosphatase: 61 U/L (ref 39–117)
BUN: 19 mg/dL (ref 6–23)
CO2: 30 mEq/L (ref 19–32)
Calcium: 9.5 mg/dL (ref 8.4–10.5)
Chloride: 101 mEq/L (ref 96–112)
Creatinine, Ser: 1.36 mg/dL — ABNORMAL HIGH (ref 0.40–1.20)
GFR: 37.06 mL/min — ABNORMAL LOW (ref >60.00)
Glucose, Bld: 91 mg/dL (ref 70–99)
Potassium: 5.1 mEq/L (ref 3.5–5.1)
Sodium: 137 mEq/L (ref 135–145)
Total Bilirubin: 0.5 mg/dL (ref 0.2–1.2)
Total Protein: 6.8 g/dL (ref 6.0–8.3)

## 2020-07-30 LAB — CBC WITH DIFFERENTIAL/PLATELET
Basophils Absolute: 0.1 10*3/uL (ref 0.0–0.1)
Basophils Relative: 1 % (ref 0.0–3.0)
Eosinophils Absolute: 0.5 10*3/uL (ref 0.0–0.7)
Eosinophils Relative: 10.8 % — ABNORMAL HIGH (ref 0.0–5.0)
HCT: 33.1 % — ABNORMAL LOW (ref 36.0–46.0)
Hemoglobin: 11.3 g/dL — ABNORMAL LOW (ref 12.0–15.0)
Lymphocytes Relative: 41.7 % (ref 12.0–46.0)
Lymphs Abs: 2.1 10*3/uL (ref 0.7–4.0)
MCHC: 34.2 g/dL (ref 30.0–36.0)
MCV: 86.9 fl (ref 78.0–100.0)
Monocytes Absolute: 0.6 10*3/uL (ref 0.1–1.0)
Monocytes Relative: 11.3 % (ref 3.0–12.0)
Neutro Abs: 1.8 10*3/uL (ref 1.4–7.7)
Neutrophils Relative %: 35.2 % — ABNORMAL LOW (ref 43.0–77.0)
Platelets: 223 10*3/uL (ref 150.0–400.0)
RBC: 3.81 Mil/uL — ABNORMAL LOW (ref 3.87–5.11)
RDW: 13.6 % (ref 11.5–15.5)
WBC: 5.1 10*3/uL (ref 4.0–10.5)

## 2020-07-30 LAB — LIPID PANEL
Cholesterol: 205 mg/dL — ABNORMAL HIGH (ref 0–200)
HDL: 49.1 mg/dL (ref >39.00)
LDL Cholesterol: 130 mg/dL — ABNORMAL HIGH (ref 0–99)
NonHDL: 155.71
Total CHOL/HDL Ratio: 4
Triglycerides: 131 mg/dL (ref 0.0–149.0)
VLDL: 26.2 mg/dL (ref 0.0–40.0)

## 2020-07-30 LAB — B12 AND FOLATE PANEL
Folate: 17.9 ng/mL (ref >5.9)
Vitamin B-12: 538 pg/mL (ref 211–911)

## 2020-07-30 LAB — IBC + FERRITIN
Ferritin: 180.8 ng/mL (ref 10.0–291.0)
Iron: 95 ug/dL (ref 42–145)
Saturation Ratios: 34.1 % (ref 20.0–50.0)
Transferrin: 199 mg/dL — ABNORMAL LOW (ref 212.0–360.0)

## 2020-07-30 LAB — MAGNESIUM: Magnesium: 2 mg/dL (ref 1.5–2.5)

## 2020-07-30 MED ORDER — LEVOTHYROXINE SODIUM 50 MCG PO TABS: 50.0000 ug | ORAL_TABLET | Freq: Every day | ORAL | 3 refills | Status: DC

## 2020-07-30 NOTE — Progress Notes (Signed)
Labss normal /unchanged except for thyroid.  Thyroid is underactive. This will cause fatigue.  I am recommending treatment  with levothyroxine.  It is taken daily  in the early morning 30 minutes before you eat or drink anything (including other medications) but water.  You will need to return for a repeat blood test after  6 weeks of therapy. Let me know if you are willing to start the medication .  Regards,  Dr. Derrel Nip

## 2020-08-18 MED ORDER — FENTANYL 50 MCG/HR TD PT72
1.0000 | MEDICATED_PATCH | TRANSDERMAL | 0 refills | Status: DC
Start: 2020-09-17 — End: 2020-09-15

## 2020-08-18 MED ORDER — FENTANYL 50 MCG/HR TD PT72
1.0000 | MEDICATED_PATCH | TRANSDERMAL | 0 refills | Status: DC
Start: 2020-10-17 — End: 2020-09-15

## 2020-08-18 MED ORDER — FENTANYL 50 MCG/HR TD PT72
1.0000 | MEDICATED_PATCH | TRANSDERMAL | 0 refills | Status: DC
Start: 2020-08-18 — End: 2020-11-25

## 2020-08-18 NOTE — Telephone Encounter (Signed)
Pt said she is completely out of Fentanyl and needs a refill.

## 2020-09-04 ENCOUNTER — Encounter: Payer: Self-pay | Admitting: Oncology

## 2020-09-05 ENCOUNTER — Other Ambulatory Visit: Payer: Self-pay | Admitting: Internal Medicine

## 2020-09-05 ENCOUNTER — Other Ambulatory Visit: Payer: Self-pay

## 2020-09-05 MED ORDER — ZETIA 10 MG PO TABS
10.0000 mg | ORAL_TABLET | Freq: Every day | ORAL | 0 refills | Status: DC
Start: 2020-09-05 — End: 2020-09-08

## 2020-09-08 ENCOUNTER — Other Ambulatory Visit: Payer: Self-pay

## 2020-09-08 DIAGNOSIS — D509 Iron deficiency anemia, unspecified: Secondary | ICD-10-CM

## 2020-09-09 ENCOUNTER — Telehealth: Payer: Self-pay | Admitting: Internal Medicine

## 2020-09-09 ENCOUNTER — Inpatient Hospital Stay: Payer: Medicare Other | Attending: Oncology

## 2020-09-09 ENCOUNTER — Other Ambulatory Visit: Payer: Self-pay

## 2020-09-09 ENCOUNTER — Encounter: Payer: Self-pay | Admitting: Oncology

## 2020-09-09 ENCOUNTER — Inpatient Hospital Stay (HOSPITAL_BASED_OUTPATIENT_CLINIC_OR_DEPARTMENT_OTHER): Payer: Medicare Other | Admitting: Oncology

## 2020-09-09 VITALS — BP 162/95 | HR 92 | Temp 98.1°F | Resp 16 | Wt 167.0 lb

## 2020-09-09 DIAGNOSIS — Z886 Allergy status to analgesic agent status: Secondary | ICD-10-CM | POA: Diagnosis not present

## 2020-09-09 DIAGNOSIS — N1831 Chronic kidney disease, stage 3a: Secondary | ICD-10-CM | POA: Insufficient documentation

## 2020-09-09 DIAGNOSIS — R5383 Other fatigue: Secondary | ICD-10-CM | POA: Insufficient documentation

## 2020-09-09 DIAGNOSIS — Z87891 Personal history of nicotine dependence: Secondary | ICD-10-CM | POA: Diagnosis not present

## 2020-09-09 DIAGNOSIS — Z8249 Family history of ischemic heart disease and other diseases of the circulatory system: Secondary | ICD-10-CM | POA: Diagnosis not present

## 2020-09-09 DIAGNOSIS — Z833 Family history of diabetes mellitus: Secondary | ICD-10-CM | POA: Insufficient documentation

## 2020-09-09 DIAGNOSIS — Z803 Family history of malignant neoplasm of breast: Secondary | ICD-10-CM | POA: Insufficient documentation

## 2020-09-09 DIAGNOSIS — Z885 Allergy status to narcotic agent status: Secondary | ICD-10-CM | POA: Insufficient documentation

## 2020-09-09 DIAGNOSIS — R946 Abnormal results of thyroid function studies: Secondary | ICD-10-CM | POA: Diagnosis not present

## 2020-09-09 DIAGNOSIS — Z9049 Acquired absence of other specified parts of digestive tract: Secondary | ICD-10-CM | POA: Diagnosis not present

## 2020-09-09 DIAGNOSIS — Z880 Allergy status to penicillin: Secondary | ICD-10-CM | POA: Diagnosis not present

## 2020-09-09 DIAGNOSIS — M549 Dorsalgia, unspecified: Secondary | ICD-10-CM | POA: Insufficient documentation

## 2020-09-09 DIAGNOSIS — E611 Iron deficiency: Secondary | ICD-10-CM | POA: Insufficient documentation

## 2020-09-09 DIAGNOSIS — Z79899 Other long term (current) drug therapy: Secondary | ICD-10-CM | POA: Diagnosis not present

## 2020-09-09 DIAGNOSIS — I2510 Atherosclerotic heart disease of native coronary artery without angina pectoris: Secondary | ICD-10-CM | POA: Insufficient documentation

## 2020-09-09 DIAGNOSIS — E785 Hyperlipidemia, unspecified: Secondary | ICD-10-CM | POA: Insufficient documentation

## 2020-09-09 DIAGNOSIS — Z8269 Family history of other diseases of the musculoskeletal system and connective tissue: Secondary | ICD-10-CM | POA: Insufficient documentation

## 2020-09-09 DIAGNOSIS — Z888 Allergy status to other drugs, medicaments and biological substances status: Secondary | ICD-10-CM | POA: Insufficient documentation

## 2020-09-09 DIAGNOSIS — D631 Anemia in chronic kidney disease: Secondary | ICD-10-CM | POA: Insufficient documentation

## 2020-09-09 DIAGNOSIS — D509 Iron deficiency anemia, unspecified: Secondary | ICD-10-CM | POA: Diagnosis not present

## 2020-09-09 DIAGNOSIS — Z823 Family history of stroke: Secondary | ICD-10-CM | POA: Diagnosis not present

## 2020-09-09 DIAGNOSIS — G894 Chronic pain syndrome: Secondary | ICD-10-CM | POA: Insufficient documentation

## 2020-09-09 DIAGNOSIS — Z820 Family history of epilepsy and other diseases of the nervous system: Secondary | ICD-10-CM | POA: Diagnosis not present

## 2020-09-09 DIAGNOSIS — N183 Chronic kidney disease, stage 3 unspecified: Secondary | ICD-10-CM | POA: Insufficient documentation

## 2020-09-09 LAB — IRON AND TIBC
Iron: 75 ug/dL (ref 28–170)
Saturation Ratios: 25 % (ref 10.4–31.8)
TIBC: 301 ug/dL (ref 250–450)
UIBC: 226 ug/dL

## 2020-09-09 LAB — CBC WITH DIFFERENTIAL/PLATELET
Abs Immature Granulocytes: 0.02 10*3/uL (ref 0.00–0.07)
Basophils Absolute: 0 10*3/uL (ref 0.0–0.1)
Basophils Relative: 0 %
Eosinophils Absolute: 0.4 10*3/uL (ref 0.0–0.5)
Eosinophils Relative: 8 %
HCT: 33.7 % — ABNORMAL LOW (ref 36.0–46.0)
Hemoglobin: 11.4 g/dL — ABNORMAL LOW (ref 12.0–15.0)
Immature Granulocytes: 0 %
Lymphocytes Relative: 39 %
Lymphs Abs: 2.1 10*3/uL (ref 0.7–4.0)
MCH: 29.3 pg (ref 26.0–34.0)
MCHC: 33.8 g/dL (ref 30.0–36.0)
MCV: 86.6 fL (ref 80.0–100.0)
Monocytes Absolute: 0.6 10*3/uL (ref 0.1–1.0)
Monocytes Relative: 12 %
Neutro Abs: 2.1 10*3/uL (ref 1.7–7.7)
Neutrophils Relative %: 41 %
Platelets: 239 10*3/uL (ref 150–400)
RBC: 3.89 MIL/uL (ref 3.87–5.11)
RDW: 12.8 % (ref 11.5–15.5)
WBC: 5.2 10*3/uL (ref 4.0–10.5)
nRBC: 0 % (ref 0.0–0.2)

## 2020-09-09 LAB — COMPREHENSIVE METABOLIC PANEL
ALT: 18 U/L (ref 0–44)
AST: 20 U/L (ref 15–41)
Albumin: 3.7 g/dL (ref 3.5–5.0)
Alkaline Phosphatase: 59 U/L (ref 38–126)
Anion gap: 10 (ref 5–15)
BUN: 20 mg/dL (ref 8–23)
CO2: 27 mmol/L (ref 22–32)
Calcium: 9.2 mg/dL (ref 8.9–10.3)
Chloride: 99 mmol/L (ref 98–111)
Creatinine, Ser: 1.24 mg/dL — ABNORMAL HIGH (ref 0.44–1.00)
GFR, Estimated: 45 mL/min — ABNORMAL LOW (ref >60)
Glucose, Bld: 116 mg/dL — ABNORMAL HIGH (ref 70–99)
Potassium: 4.7 mmol/L (ref 3.5–5.1)
Sodium: 136 mmol/L (ref 135–145)
Total Bilirubin: 0.7 mg/dL (ref 0.3–1.2)
Total Protein: 7.2 g/dL (ref 6.5–8.1)

## 2020-09-09 LAB — FERRITIN: Ferritin: 202 ng/mL (ref 11–307)

## 2020-09-09 NOTE — Telephone Encounter (Signed)
Placed in quick sign folder.  

## 2020-09-09 NOTE — Telephone Encounter (Signed)
Pt dropped a handicap parking placard form to be filled out  placed in Dr. Lupita Dawn color folder upfront  Pt is wanting this filled out asap because her other one has expired

## 2020-09-09 NOTE — Progress Notes (Signed)
Hematology/Oncology follow up  note [facility] [phone] [phone]   Patient Care Team: Crecencio Mc, MD as PCP - General (Internal Medicine)  REFERRING PROVIDER: Crecencio Mc, MD REASON FOR VISIT Follow up for management of anemia   HISTORY OF PRESENTING ILLNESS:  Sabrina [name] is a  79 y.o.  female with PMH listed below who was referred to me for evaluation of anemia.  Patient recently had lab work done which revealed anemia with hemoglobin 11.5.   Reviewed patient's previous labs, her hemoglobin ranges from 10.8 to 11.8 in the past.  Reports feeling fatigue.  Patient denies weight loss, easy bruising, hematochezia, hemoptysis.  Has history of CKD and follows up with nephrologist.  Chronic pain syndrome. On Tramadol and fentanyl patch.  INTERVAL HISTORY Sabrina [name] is a 79 y.o. female who has above history reviewed by me today presents for follow up visit for management of anemia.  Patient's blood pressure is high.  She says that she has whitecoat syndrome and always have high blood pressure at doctor's office. Patient reports feeling tired. She was recently started on levothyroxine 50 MCG after being found to have elevated TSH. Patient takes oral iron supplementation.  She mentioned that she was advised not to take additional iron supplementation.  Review of Systems  Constitutional: Positive for malaise/fatigue. Negative for chills, fever and weight loss.  HENT: Negative for sore throat.   Eyes: Negative for redness.  Respiratory: Negative for cough, shortness of breath and wheezing.   Cardiovascular: Negative for chest pain, palpitations and leg swelling.  Gastrointestinal: Negative for abdominal pain, blood in stool, nausea and vomiting.  Genitourinary: Negative for dysuria.  Musculoskeletal: Positive for back pain. Negative for myalgias.  Skin: Negative for rash.  Neurological: Negative for dizziness,  tingling and tremors.  Endo/Heme/Allergies: Does not bruise/bleed easily.  Psychiatric/Behavioral: Negative for hallucinations.    MEDICAL HISTORY:  Past Medical History:  Diagnosis Date  . Anemia of chronic kidney failure   . Anxiety   . CAD (coronary artery disease)   . Carotid artery stenosis   . Chronic kidney disease, stage III (moderate) (HCC)    Followed by Dr. Juleen China  . Hyperlipidemia   . Hypertension   . Renal artery stenosis ([place])   . Secondary hyperparathyroidism ([place])   . 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 ABDOMINAL HYSTERECTOMY  1976  . CAROTID ARTERY ANGIOPLASTY Left   . CAROTID ENDARTERECTOMY Left   . CHOLECYSTECTOMY    . COLONOSCOPY WITH PROPOFOL N/A 08/16/2017   Procedure: COLONOSCOPY WITH PROPOFOL;  Surgeon: [name], MD;  Location: [facility] ENDOSCOPY;  Service: Endoscopy;  Laterality: N/A;  . CORONARY ANGIOPLASTY WITH STENT PLACEMENT  2000  . CORONARY ARTERY BYPASS GRAFT  2000  . CYSTOSCOPY WITH STENT PLACEMENT Bilateral   . ESOPHAGOGASTRODUODENOSCOPY (EGD) WITH PROPOFOL N/A 08/16/2017   Procedure: ESOPHAGOGASTRODUODENOSCOPY (EGD) WITH PROPOFOL;  Surgeon: [name], MD;  Location: [facility] ENDOSCOPY;  Service: Endoscopy;  Laterality: N/A;  . ESOPHAGOGASTRODUODENOSCOPY (EGD) WITH PROPOFOL N/A 06/29/2018   Procedure: ESOPHAGOGASTRODUODENOSCOPY (EGD) WITH PROPOFOL;  Surgeon: Virgel Manifold, MD;  Location: [facility] ENDOSCOPY;  Service: Endoscopy;  Laterality: N/A;  . JOINT REPLACEMENT Bilateral 2000  . RENAL ARTERY ANGIOPLASTY Bilateral mARCH 2015  . TOE AMPUTATION Right    small toe  . TONSILLECTOMY AND ADENOIDECTOMY    . TOTAL HIP  ARTHROPLASTY Left   . TOTAL HIP ARTHROPLASTY Right 2015    SOCIAL HISTORY: Social History   Socioeconomic History  . Marital status: Divorced    Spouse name: Not on file  . Number of  children: Not on file  . Years of education: Not on file  . Highest education level: Not on file  Occupational History  . Occupation: retired  Tobacco Use  . Smoking status: Former Smoker    Quit date: 10/25/1998    Years since quitting: 21.8  . Smokeless tobacco: Never Used  Vaping Use  . Vaping Use: Never used  Substance and Sexual Activity  . Alcohol use: No    Alcohol/week: 0.0 standard drinks  . Drug use: Never  . Sexual activity: Not Currently  Other Topics Concern  . Not on file  Social History Narrative  . Not on file   Social Determinants of Health   Financial Resource Strain:   . Difficulty of Paying Living Expenses: Not on file  Food Insecurity:   . Worried About Charity fundraiser in the Last Year: Not on file  . Ran Out of Food in the Last Year: Not on file  Transportation Needs:   . Lack of Transportation (Medical): Not on file  . Lack of Transportation (Non-Medical): Not on file  Physical Activity:   . Days of Exercise per Week: Not on file  . Minutes of Exercise per Session: Not on file  Stress:   . Feeling of Stress : Not on file  Social Connections:   . Frequency of Communication with Friends and Family: Not on file  . Frequency of Social Gatherings with Friends and Family: Not on file  . Attends Religious Services: Not on file  . Active Member of Clubs or Organizations: Not on file  . Attends Archivist Meetings: Not on file  . Marital Status: Not on file  Intimate Partner Violence:   . Fear of Current or Ex-Partner: Not on file  . Emotionally Abused: Not on file  . Physically Abused: Not on file  . Sexually Abused: Not on file    FAMILY HISTORY: Family History  Problem Relation Age of Onset  . Stroke Mother   . Hypertension Mother   . Diabetes Mother   . Hypertension Father   . Heart disease Sister        MI  . Multiple sclerosis Daughter   . Multiple sclerosis Son   . Cerebral aneurysm Son   . Seizures Son   . Cerebral  aneurysm Son   . Breast cancer Paternal Aunt 66    ALLERGIES:  is allergic to citalopram, dilaudid [hydromorphone hcl], hydrochlorothiazide, nsaids, nubain [nalbuphine hcl], penicillins, prasugrel, and statins.  MEDICATIONS:  Current Outpatient Medications  Medication Sig Dispense Refill  . aspirin 81 MG tablet Take 81 mg by mouth daily.    . chlordiazePOXIDE (LIBRIUM) 25 MG capsule TAKE 1 CAPSULE BY MOUTH ONCE DAILY AS NEEDED FOR ANXIETY 30 capsule 5  . clopidogrel (PLAVIX) 75 MG tablet Take 1 tablet by mouth once daily 90 tablet 0  . [START ON 09/17/2020] fentaNYL (DURAGESIC) 50 MCG/HR Place 1 patch onto the skin every 3 (three) days. 10 patch 0  . ferrous sulfate (FEROSUL) 325 (65 FE) MG tablet Take 1 tablet (325 mg total) by mouth daily with breakfast. 90 tablet 0  . hydrALAZINE (APRESOLINE) 50 MG tablet TAKE 1 TABLET BY MOUTH THREE TIMES DAILY AS NEEDED FOR BLOOD PRESSURE GREATER THAN 150 270 tablet  1  . labetalol (NORMODYNE) 200 MG tablet Take 1 tablet (200 mg total) by mouth 2 (two) times daily. 180 tablet 1  . levothyroxine (SYNTHROID) 50 MCG tablet Take 1 tablet (50 mcg total) by mouth daily. 90 tablet 3  . lisinopril (ZESTRIL) 20 MG tablet Take 1 tablet (20 mg total) by mouth 2 (two) times daily. 180 tablet 1  . NARCAN 4 MG/0.1ML LIQD nasal spray kit     . nortriptyline (PAMELOR) 10 MG capsule Take 1 capsule (10 mg total) by mouth 2 (two) times daily. 180 capsule 1  . Omega 3 1000 MG CAPS Take 1 capsule by mouth 2 (two) times daily.     . pantoprazole (PROTONIX) 40 MG tablet Take 1 tablet (40 mg total) by mouth daily. On an empty stomach  For gastritis 30 tablet 3  . traMADol (ULTRAM) 50 MG tablet TAKE 2 TABLETS BY MOUTH EVERY 12 HOURS AS NEEDED. MAX OF 4 TABLETS DAILY. 120 tablet 0  . ZETIA 10 MG tablet Take 1 tablet by mouth once daily 30 tablet 0  . acetaminophen-codeine (TYLENOL #4) 300-60 MG tablet Take 1 tablet by mouth every 4 (four) hours as needed for moderate pain.  (Patient not taking: Reported on 09/09/2020) 30 tablet 0  . fentaNYL (DURAGESIC) 50 MCG/HR Place 1 patch onto the skin every 3 (three) days. (Patient not taking: Reported on 09/09/2020) 10 patch 0  . [START ON 10/17/2020] fentaNYL (DURAGESIC) 50 MCG/HR Place 1 patch onto the skin every 3 (three) days. (Patient not taking: Reported on 09/09/2020) 10 patch 0   No current facility-administered medications for this visit.     PHYSICAL EXAMINATION: ECOG PERFORMANCE STATUS: 1 - Symptomatic but completely ambulatory Vitals:   09/09/20 1325  BP: (!) 162/95  Pulse: 92  Resp: 16  Temp: 98.1 F (36.7 C)  SpO2: 98%   Filed Weights   09/09/20 1325  Weight: 167 lb (75.8 kg)    Physical Exam Constitutional:      General: She is not in acute distress.    Appearance: She is well-developed.  HENT:     Head: Normocephalic and atraumatic.     Right Ear: External ear normal.     Left Ear: External ear normal.  Eyes:     General: No scleral icterus.    Conjunctiva/sclera: Conjunctivae normal.     Pupils: Pupils are equal, round, and reactive to light.  Cardiovascular:     Rate and Rhythm: Normal rate and regular rhythm.     Heart sounds: No murmur heard.   Pulmonary:     Effort: Pulmonary effort is normal. No respiratory distress.     Breath sounds: Normal breath sounds. No wheezing or rales.  Chest:     Chest wall: No tenderness.  Abdominal:     General: Bowel sounds are normal. There is no distension.     Palpations: Abdomen is soft. There is no mass.     Tenderness: There is no abdominal tenderness.  Musculoskeletal:        General: No deformity. Normal range of motion.     Cervical back: Normal range of motion and neck supple.  Lymphadenopathy:     Cervical: No cervical adenopathy.  Skin:    General: Skin is warm and dry.     Findings: No erythema or rash.  Neurological:     Mental Status: She is alert and oriented to person, place, and time. Mental status is at baseline.      Cranial Nerves:  No cranial nerve deficit.     Coordination: Coordination normal.  Psychiatric:        Mood and Affect: Mood normal.      LABORATORY DATA:  I have reviewed the data as listed Lab Results  Component Value Date   WBC 5.2 09/09/2020   HGB 11.4 (L) 09/09/2020   HCT 33.7 (L) 09/09/2020   MCV 86.6 09/09/2020   PLT 239 09/09/2020   Recent Labs    09/14/19 1255 09/14/19 1255 03/10/20 1311 07/29/20 1543 09/09/20 1255  NA 135   < > 137 137 136  K 4.0   < > 4.6 5.1 4.7  CL 102   < > 102 101 99  CO2 26   < > $R'28 30 27  'da$ GLUCOSE 122*   < > 104* 91 116*  BUN 17   < > $R'19 19 20  'eE$ CREATININE 1.04*   < > 1.21* 1.36* 1.24*  CALCIUM 9.1   < > 9.3 9.5 9.2  GFRNONAA 52*  --  43*  --  45*  GFRAA >60  --  50*  --   --   PROT 7.1   < > 7.0 6.8 7.2  ALBUMIN 3.9   < > 3.7 4.0 3.7  AST 20   < > $R'17 16 20  'yy$ ALT 17   < > $R'13 13 18  'AU$ ALKPHOS 65   < > 62 61 59  BILITOT 0.6   < > 0.6 0.5 0.7   < > = values in this interval not displayed.       ASSESSMENT & PLAN:  1. Anemia due to stage 3a chronic kidney disease ([place])   2. Iron deficiency anemia, unspecified iron deficiency anemia type   3. Other fatigue    #Anemia, History of iron deficiency anemia.-Resolved Labs are reviewed and discussed with patient. Hemoglobin has improved to 11.4. In the context of anemia due to chronic kidney disease, I recommend patient to keep iron store adequate. Recent iron panel showed ferritin of 202, iron saturation 25, iron saturation 300 I recommend patient to hold off iron supplementation for now. No need for erythropoietin therapy as hemoglobin is above 10  Fatigue, this is not proportionate to her anemia level.  Likely due to her newly diagnosed hypothyroidism.  I encourage patient to follow-up with primary care provider and recheck her TSH. Return of visit: 6 months. Earlie Server, MD, PhD Hematology Oncology [place] [facility] 09/09/2020

## 2020-09-11 LAB — PROTEIN ELECTRO, RANDOM URINE
Albumin ELP, Urine: 66.3 %
Alpha-1-Globulin, U: 3.6 %
Alpha-2-Globulin, U: 7.5 %
Beta Globulin, U: 9.5 %
Gamma Globulin, U: 13.1 %
Total Protein, Urine: 11.9 mg/dL

## 2020-09-12 ENCOUNTER — Other Ambulatory Visit: Payer: Self-pay | Admitting: Internal Medicine

## 2020-09-15 ENCOUNTER — Telehealth: Payer: Self-pay

## 2020-09-15 ENCOUNTER — Telehealth: Payer: Self-pay | Admitting: Internal Medicine

## 2020-09-15 MED ORDER — HYDRALAZINE HCL 50 MG PO TABS
ORAL_TABLET | ORAL | 1 refills | Status: DC
Start: 2020-09-15 — End: 2021-07-01

## 2020-09-15 MED ORDER — NORTRIPTYLINE HCL 10 MG PO CAPS
10.0000 mg | ORAL_CAPSULE | Freq: Two times a day (BID) | ORAL | 1 refills | Status: DC
Start: 2020-09-15 — End: 2021-04-24

## 2020-09-15 MED ORDER — FENTANYL 50 MCG/HR TD PT72: 1.0000 | MEDICATED_PATCH | TRANSDERMAL | 0 refills | Status: DC

## 2020-09-15 NOTE — Telephone Encounter (Signed)
Pharmacy called pt is going out of town and wanted to get the fentaNYL ([place]) 50 MCG/HR filled today before she leaves to go out of town  rx is not do to be filled til 11/24

## 2020-09-15 NOTE — Telephone Encounter (Signed)
Nov 24 refill sent again to [facility]  with instructions to refill early.  Let her know that I have no way to ensure that the pharmacy will comply; they have rules they follow

## 2020-09-15 NOTE — Telephone Encounter (Signed)
New rx sent for refill on Dec 1 instead of dec 24

## 2020-09-15 NOTE — Telephone Encounter (Signed)
Patient has been informed.

## 2020-09-15 NOTE — Addendum Note (Signed)
Addended by: Crecencio Mc on: 09/15/2020 12:48 PM   Modules accepted: Orders

## 2020-09-15 NOTE — Addendum Note (Signed)
Addended by: Crecencio Mc on: 09/15/2020 04:44 PM   Modules accepted: Orders

## 2020-09-15 NOTE — Telephone Encounter (Signed)
Pharmacy called and stated that they can only give her 9 days worth due to current supply she will need a new rx sent in for the 1DEC21 instead of 24DEC21. Please advise.

## 2020-09-25 DIAGNOSIS — Z23 Encounter for immunization: Secondary | ICD-10-CM | POA: Diagnosis not present

## 2020-09-26 ENCOUNTER — Other Ambulatory Visit: Payer: Self-pay | Admitting: Internal Medicine

## 2020-09-26 MED ORDER — LISINOPRIL 20 MG PO TABS
20.0000 mg | ORAL_TABLET | Freq: Two times a day (BID) | ORAL | 1 refills | Status: DC
Start: 2020-09-26 — End: 2020-12-30

## 2020-09-26 MED ORDER — ZETIA 10 MG PO TABS
10.0000 mg | ORAL_TABLET | Freq: Every day | ORAL | 1 refills | Status: DC
Start: 2020-09-26 — End: 2020-12-30

## 2020-10-02 ENCOUNTER — Ambulatory Visit (INDEPENDENT_AMBULATORY_CARE_PROVIDER_SITE_OTHER): Payer: Medicare Other | Admitting: Vascular Surgery

## 2020-10-02 ENCOUNTER — Ambulatory Visit (INDEPENDENT_AMBULATORY_CARE_PROVIDER_SITE_OTHER): Payer: Medicare Other

## 2020-10-02 ENCOUNTER — Encounter (INDEPENDENT_AMBULATORY_CARE_PROVIDER_SITE_OTHER): Payer: Medicare Other

## 2020-10-02 ENCOUNTER — Other Ambulatory Visit: Payer: Self-pay

## 2020-10-02 ENCOUNTER — Encounter (INDEPENDENT_AMBULATORY_CARE_PROVIDER_SITE_OTHER): Payer: Self-pay | Admitting: Vascular Surgery

## 2020-10-02 VITALS — BP 114/74 | HR 83 | Ht 61.0 in | Wt 171.0 lb

## 2020-10-02 DIAGNOSIS — I2510 Atherosclerotic heart disease of native coronary artery without angina pectoris: Secondary | ICD-10-CM | POA: Diagnosis not present

## 2020-10-02 DIAGNOSIS — I10 Essential (primary) hypertension: Secondary | ICD-10-CM

## 2020-10-02 DIAGNOSIS — I701 Atherosclerosis of renal artery: Secondary | ICD-10-CM | POA: Diagnosis not present

## 2020-10-02 DIAGNOSIS — I739 Peripheral vascular disease, unspecified: Secondary | ICD-10-CM

## 2020-10-02 DIAGNOSIS — E782 Mixed hyperlipidemia: Secondary | ICD-10-CM

## 2020-10-02 DIAGNOSIS — I6523 Occlusion and stenosis of bilateral carotid arteries: Secondary | ICD-10-CM | POA: Diagnosis not present

## 2020-10-02 NOTE — Progress Notes (Signed)
MRN : [id number]  Sabrina [name] is a 79 y.o. (1941/05/03) female who presents with chief complaint of  Chief Complaint  Patient presents with  . Follow-up    U/S follow up  .  History of Present Illness:   The patient is seen for follow up evaluation of carotid stenosis. The carotid stenosis followed by ultrasound.   The patient denies amaurosis fugax. There is no recent history of TIA symptoms or focal motor deficits. There is no prior documented CVA.  The patient is taking enteric-coated aspirin 81 mg daily.  There is no history of migraine headaches. There is no history of seizures.  The patient isalso followedfor evaluation of painful lower extremities. Patient notes the pain is variable and not always associated with activity. Both legs are the same.The pain is somewhat consistent day to day occurring on most days. The patient notes the pain also occurs with standing and routinely seems worse as the day wears on. The pain has been stable over the pastl year. The patient states these symptoms are not causing a negative impact on quality of life and daily activities.  The patient denies rest pain or dangling of an extremity off the side of the bed during the night for relief. No open wounds or sores at this time. No history of DVT or phlebitis.  There is a history of back problems and DJD of the lumbar and sacral spine.  Duplex ultrasound of the carotid arteries shows chronic occlusion of the RICA and LICA is 5-27%.  No change compared to last study  ABIs right=0.68 and left=0.66 (previous ABI right=0.58 and left=0.51)  Current Meds  Medication Sig  . acetaminophen-codeine (TYLENOL #4) 300-60 MG tablet Take 1 tablet by mouth every 4 (four) hours as needed for moderate pain.  Marland Kitchen aspirin 81 MG tablet Take 81 mg by mouth daily.  . chlordiazePOXIDE (LIBRIUM) 25 MG capsule TAKE 1 CAPSULE BY MOUTH ONCE DAILY AS NEEDED FOR ANXIETY  . clopidogrel (PLAVIX) 75 MG  tablet Take 1 tablet by mouth once daily  . fentaNYL (DURAGESIC) 50 MCG/HR Place 1 patch onto the skin every 3 (three) days.  . fentaNYL (DURAGESIC) 50 MCG/HR Place 1 patch onto the skin every 3 (three) days.  . fentaNYL (DURAGESIC) 50 MCG/HR Place 1 patch onto the skin every 3 (three) days.  . ferrous sulfate (FEROSUL) 325 (65 FE) MG tablet Take 1 tablet (325 mg total) by mouth daily with breakfast.  . hydrALAZINE (APRESOLINE) 50 MG tablet TAKE 1 TABLET BY MOUTH THREE TIMES DAILY AS NEEDED FOR BLOOD PRESSURE GREATER  THAN  150  . hydrALAZINE (APRESOLINE) 50 MG tablet TAKE 1 TABLET BY MOUTH THREE TIMES DAILY AS NEEDED FOR BLOOD PRESSURE GREATER THAN 150  . labetalol (NORMODYNE) 200 MG tablet Take 1 tablet (200 mg total) by mouth 2 (two) times daily.  Marland Kitchen levothyroxine (SYNTHROID) 50 MCG tablet Take 1 tablet (50 mcg total) by mouth daily.  Marland Kitchen lisinopril (ZESTRIL) 20 MG tablet Take 1 tablet by mouth twice daily  . lisinopril (ZESTRIL) 20 MG tablet Take 1 tablet (20 mg total) by mouth 2 (two) times daily.  Marland Kitchen NARCAN 4 MG/0.1ML LIQD nasal spray kit   . nortriptyline (PAMELOR) 10 MG capsule Take 1 capsule (10 mg total) by mouth 2 (two) times daily.  . Omega 3 1000 MG CAPS Take 1 capsule by mouth 2 (two) times daily.   . pantoprazole (PROTONIX) 40 MG tablet Take 1 tablet (40 mg total) by mouth  daily. On an empty stomach  For gastritis  . traMADol (ULTRAM) 50 MG tablet TAKE 2 TABLETS BY MOUTH EVERY 12 HOURS AS NEEDED. MAX OF 4 TABLETS DAILY.  Marland Kitchen ZETIA 10 MG tablet Take 1 tablet by mouth once daily  . ZETIA 10 MG tablet Take 1 tablet (10 mg total) by mouth daily.    Past Medical History:  Diagnosis Date  . Anemia of chronic kidney failure   . Anxiety   . CAD (coronary artery disease)   . Carotid artery stenosis   . Chronic kidney disease, stage III (moderate) (HCC)    Followed by Dr. Juleen China  . Hyperlipidemia   . Hypertension   . Renal artery stenosis ([place])   . Secondary hyperparathyroidism ([place])    . 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 Subclavian arterial stenosis [facility])     Past Surgical History:  Procedure Laterality Date  . ABDOMINAL HYSTERECTOMY  1976  . CAROTID ARTERY ANGIOPLASTY Left   . CAROTID ENDARTERECTOMY Left   . CHOLECYSTECTOMY    . COLONOSCOPY WITH PROPOFOL N/A 08/16/2017   Procedure: COLONOSCOPY WITH PROPOFOL;  Surgeon: [name], MD;  Location: [facility] ENDOSCOPY;  Service: Endoscopy;  Laterality: N/A;  . CORONARY ANGIOPLASTY WITH STENT PLACEMENT  2000  . CORONARY ARTERY BYPASS GRAFT  2000  . CYSTOSCOPY WITH STENT PLACEMENT Bilateral   . ESOPHAGOGASTRODUODENOSCOPY (EGD) WITH PROPOFOL N/A 08/16/2017   Procedure: ESOPHAGOGASTRODUODENOSCOPY (EGD) WITH PROPOFOL;  Surgeon: [name], MD;  Location: [facility] ENDOSCOPY;  Service: Endoscopy;  Laterality: N/A;  . ESOPHAGOGASTRODUODENOSCOPY (EGD) WITH PROPOFOL N/A 06/29/2018   Procedure: ESOPHAGOGASTRODUODENOSCOPY (EGD) WITH PROPOFOL;  Surgeon: Virgel Manifold, MD;  Location: [facility] ENDOSCOPY;  Service: Endoscopy;  Laterality: N/A;  . JOINT REPLACEMENT Bilateral 2000  . RENAL ARTERY ANGIOPLASTY Bilateral mARCH 2015  . TOE AMPUTATION Right    small toe  . TONSILLECTOMY AND ADENOIDECTOMY    . TOTAL HIP ARTHROPLASTY Left   . TOTAL HIP ARTHROPLASTY Right 2015    Social History Social History   Tobacco Use  . Smoking status: Former Smoker    Quit date: 10/25/1998    Years since quitting: 21.9  . Smokeless tobacco: Never Used  Vaping Use  . Vaping Use: Never used  Substance Use Topics  . Alcohol use: No    Alcohol/week: 0.0 standard drinks  . Drug use: Never    Family History Family History  Problem Relation Age of Onset  . Stroke Mother   . Hypertension Mother   . Diabetes Mother   . Hypertension Father   . Heart disease Sister        MI  . Multiple sclerosis Daughter   . Multiple sclerosis Son   . Cerebral aneurysm Son   . Seizures Son   . Cerebral aneurysm Son   . Breast cancer  Paternal Aunt 69    Allergies  Allergen Reactions  . Citalopram     Throat closing   . Dilaudid [Hydromorphone Hcl] Nausea And Vomiting  . Hydrochlorothiazide Other (See Comments)    Decreased GFR (Nov 2015)  . Nsaids     CKD stage III - avoid nephrotoxic drugs  . Nubain [Nalbuphine Hcl]     Burning sensation in back  . Penicillins Itching  . Prasugrel Itching  . Statins Itching     REVIEW OF SYSTEMS (Negative unless checked)  Constitutional: _0 Weight loss  _1 Fever  _2 Chills Cardiac: _3 Chest pain   _4 Chest pressure   _5 Palpitations   _6 Shortness of breath  when laying flat   _0 Shortness of breath with exertion. Vascular:  _1 Pain in legs with walking   _2 Pain in legs at rest  _3 History of DVT   _4 Phlebitis   _5 Swelling in legs   _6 Varicose veins   _7 Non-healing ulcers Pulmonary:   _8 Uses home oxygen   _9 Productive cough   _10 Hemoptysis   _11 Wheeze  _12 COPD   _13 Asthma Neurologic:  _14 Dizziness   _15 Seizures   _16 History of stroke   _17 History of TIA  _18 Aphasia   _19 Vissual changes   _20 Weakness or numbness in arm   _21 Weakness or numbness in leg Musculoskeletal:   _22 Joint swelling   _23 Joint pain   _24 Low back pain Hematologic:  _25 Easy bruising  _26 Easy bleeding   _27 Hypercoagulable state   _28 Anemic Gastrointestinal:  _29 Diarrhea   _30 Vomiting  _31 Gastroesophageal reflux/heartburn   _32 Difficulty swallowing. Genitourinary:  _33 Chronic kidney disease   _34 Difficult urination  _35 Frequent urination   _36 Blood in urine Skin:  _37 Rashes   _38 Ulcers  Psychological:  _39 History of anxiety   _40  History of major depression.  Physical Examination  Vitals:   10/02/20 1616  BP: 114/74  Pulse: 83  Weight: 171 lb (77.6 kg)  Height: _41  (1.549 m)   Body mass index is 32.31 kg/m. Gen: WD/WN, NAD Head: [place]/AT, No temporalis wasting.  Ear/Nose/Throat: Hearing grossly intact, nares w/o erythema or drainage Eyes: PER, EOMI, sclera nonicteric.  Neck: Supple, no large masses.   Pulmonary:  Good air movement,  no audible wheezing bilaterally, no use of accessory muscles.  Cardiac: RRR, no JVD Vascular:  Vessel Right Left  Radial Palpable Palpable  Gastrointestinal: Non-distended. No guarding/no peritoneal signs.  Musculoskeletal: M/S 5/5 throughout.  No deformity or atrophy.  Neurologic: CN 2-12 intact. Symmetrical.  Speech is fluent. Motor exam as listed above. Psychiatric: Judgment intact, Mood & affect appropriate for pt's clinical situation. Dermatologic: No rashes or ulcers noted.  No changes consistent with cellulitis.   CBC Lab Results  Component Value Date   WBC 5.2 09/09/2020   HGB 11.4 (L) 09/09/2020   HCT 33.7 (L) 09/09/2020   MCV 86.6 09/09/2020   PLT 239 09/09/2020    BMET    Component Value Date/Time   NA 136 09/09/2020 1255   NA 139 09/13/2014 0000   NA 134 (L) 04/12/2014 0558   K 4.7 09/09/2020 1255   K 4.4 04/12/2014 0558   CL 99 09/09/2020 1255   CL 101 04/12/2014 0558   CO2 27 09/09/2020 1255   CO2 27 04/12/2014 0558   GLUCOSE 116 (H) 09/09/2020 1255   GLUCOSE 90 04/12/2014 0558   BUN 20 09/09/2020 1255   BUN 18 09/13/2014 0000   BUN 16 04/12/2014 0558   CREATININE 1.24 (H) 09/09/2020 1255   CREATININE 1.12 04/12/2014 0558   CALCIUM 9.2 09/09/2020 1255   CALCIUM 8.5 04/12/2014 0558   GFRNONAA 45 (L) 09/09/2020 1255   GFRNONAA 49 (L) 04/12/2014 0558   GFRAA 50 (L) 03/10/2020 1311   GFRAA 57 (L) 04/12/2014 0558   CrCl cannot be calculated (Patient's most recent lab result is older than the maximum 21 days allowed.).  COAG Lab Results  Component Value Date   INR 1.0 03/27/2014    Radiology VAS [place] ABI WITH/WO TBI  Result Date: 10/02/2020 LOWER EXTREMITY DOPPLER STUDY Indications: Claudication, rest pain, and peripheral artery disease.  Comparison Study: 10/03/2019 Performing Technologist: Concha Norway RVT  Examination Guidelines: A complete evaluation includes at minimum, Doppler waveform signals and systolic blood pressure reading at the level of  bilateral brachial, anterior tibial, and posterior tibial arteries, when vessel segments are accessible. Bilateral testing is considered an integral part of a complete examination. Photoelectric Plethysmograph (PPG) waveforms and toe systolic pressure readings are included as required and additional duplex testing as needed. Limited examinations for reoccurring indications may be performed as noted.  ABI Findings: +---------+------------------+-----+----------+--------+ Right    Rt Pressure (mmHg)IndexWaveform  Comment  +---------+------------------+-----+----------+--------+ Brachial 169                                       +---------+------------------+-----+----------+--------+ ATA      130               0.77 monophasic         +---------+------------------+-----+----------+--------+ PTA      112               0.66 monophasic         +---------+------------------+-----+----------+--------+ Great Toe64                0.38 Abnormal           +---------+------------------+-----+----------+--------+ +---------+------------------+-----+----------+-------+ Left     Lt Pressure (mmHg)IndexWaveform  Comment +---------+------------------+-----+----------+-------+ ATA      100               0.59 monophasic        +---------+------------------+-----+----------+-------+ PTA      92                0.54 monophasic        +---------+------------------+-----+----------+-------+ Great Toe51                0.30 Abnormal          +---------+------------------+-----+----------+-------+ +-------+-----------+-----------+------------+------------+ ABI/TBIToday's ABIToday's TBIPrevious ABIPrevious TBI +-------+-----------+-----------+------------+------------+ Right  .77        .38        .58         .40          +-------+-----------+-----------+------------+------------+ Left   .59        .30        .51         .41           +-------+-----------+-----------+------------+------------+ Compared to prior study on 10/03/2019.  Summary: Right: Resting right ankle-brachial index indicates moderate right lower extremity arterial disease. The right toe-brachial index is abnormal. Left: Resting left ankle-brachial index indicates moderate left lower extremity arterial disease. The left toe-brachial index is abnormal.  *See table(s) above for measurements and observations.  Electronically signed by Hortencia Pilar MD on 10/02/2020 at 5:47:12 PM.   Final    VAS US CAROTID  Result Date: 10/02/2020 Carotid Arterial Duplex Study Indications:       Carotid artery disease and rt ica occlusion. Comparison Study:  10/03/2019 Performing Technologist: Concha Norway RVT  Examination Guidelines: A complete evaluation includes B-mode imaging, spectral Doppler, color Doppler, and power Doppler as needed of all accessible portions of each vessel. Bilateral testing is considered an integral part of a complete examination. Limited examinations for reoccurring indications may be performed as noted.  Right Carotid Findings: +--------+--------+--------+--------+------------------+--------+         PSV cm/sEDV cm/sStenosisPlaque DescriptionComments +--------+--------+--------+--------+------------------+--------+ CCA Mid 30      2                                          +--------+--------+--------+--------+------------------+--------+  ICA Prox                Occluded                           +--------+--------+--------+--------+------------------+--------+ ECA     70      14                                         +--------+--------+--------+--------+------------------+--------+ +----------+--------+-------+----------------+-------------------+           PSV cm/sEDV cmsDescribe        Arm Pressure (mmHG) +----------+--------+-------+----------------+-------------------+ Subclavian207            Multiphasic, WNL                     +----------+--------+-------+----------------+-------------------+ +---------+--------+---+--------+---------+ VertebralPSV cm/s117EDV cm/sAntegrade +---------+--------+---+--------+---------+  Left Carotid Findings: +----------+--------+--------+--------+------------------+--------+           PSV cm/sEDV cm/sStenosisPlaque DescriptionComments +----------+--------+--------+--------+------------------+--------+ CCA Prox  132     18                                         +----------+--------+--------+--------+------------------+--------+ CCA Mid   91      26                                         +----------+--------+--------+--------+------------------+--------+ CCA Distal68      22                                         +----------+--------+--------+--------+------------------+--------+ ICA Prox  69      23      1-39%                              +----------+--------+--------+--------+------------------+--------+ ICA Mid   69      25                                         +----------+--------+--------+--------+------------------+--------+ ICA Distal90      34                                         +----------+--------+--------+--------+------------------+--------+ ECA       124     23                                         +----------+--------+--------+--------+------------------+--------+ +----------+--------+--------+--------+-------------------+           PSV cm/sEDV cm/sDescribeArm Pressure (mmHG) +----------+--------+--------+--------+-------------------+ [id number]             Stenotic                    +----------+--------+--------+--------+-------------------+ +---------+--------+--+--------+---------+ VertebralPSV cm/s60EDV cm/sAntegrade +---------+--------+--+--------+---------+   Summary: Right Carotid: Evidence consistent with a  total occlusion of the right ICA.                Patent CCA, ECA. Left Carotid:  Velocities in the left ICA are consistent with a 1-39% stenosis. Vertebrals:  Bilateral vertebral arteries demonstrate antegrade flow. Subclavians: Left subclavian artery was stenotic. Normal flow hemodynamics were              seen in the right subclavian artery. *See table(s) above for measurements and observations.  Electronically signed by Hortencia Pilar MD on 10/02/2020 at 5:47:17 PM.    Final      Assessment/Plan 1. Peripheral vascular disease ([place])  Recommend:  The patient has evidence of atherosclerosis of the lower extremities with claudication.  The patient does not voice lifestyle limiting changes at this point in time.  Noninvasive studies do not suggest clinically significant change.  No invasive studies, angiography or surgery at this time The patient should continue walking and begin a more formal exercise program.  The patient should continue antiplatelet therapy and aggressive treatment of the lipid abnormalities  No changes in the patient's medications at this time  - VAS [place] ABI WITH/WO TBI; Future  2. Bilateral carotid artery stenosis Recommend:  Given the patient's asymptomatic subcritical stenosis no further invasive testing or surgery at this time.  Continue antiplatelet therapy as prescribed Continue management of CAD, HTN and Hyperlipidemia Healthy heart diet,  encouraged exercise at least 4 times per week. Follow up in 12 months with duplex ultrasound and physical exam  - VAS US CAROTID; Future  3. Coronary artery disease involving native coronary artery of native heart without angina pectoris Continue cardiac and antihypertensive medications as already ordered and reviewed, no changes at this time.  Continue statin as ordered and reviewed, no changes at this time  Nitrates PRN for chest pain   4. Essential hypertension Continue antihypertensive medications as already ordered, these medications have been reviewed and there are no changes at this  time.   5. Mixed hyperlipidemia Continue statin as ordered and reviewed, no changes at this time     Hortencia Pilar, MD  10/02/2020 9:19 PM

## 2020-10-03 ENCOUNTER — Other Ambulatory Visit: Payer: Self-pay | Admitting: Internal Medicine

## 2020-10-03 DIAGNOSIS — I10 Essential (primary) hypertension: Secondary | ICD-10-CM

## 2020-10-07 DIAGNOSIS — I10 Essential (primary) hypertension: Secondary | ICD-10-CM | POA: Diagnosis not present

## 2020-10-07 DIAGNOSIS — E782 Mixed hyperlipidemia: Secondary | ICD-10-CM | POA: Diagnosis not present

## 2020-10-07 DIAGNOSIS — I208 Other forms of angina pectoris: Secondary | ICD-10-CM | POA: Diagnosis not present

## 2020-10-07 DIAGNOSIS — I714 Abdominal aortic aneurysm, without rupture: Secondary | ICD-10-CM | POA: Diagnosis not present

## 2020-10-07 DIAGNOSIS — K219 Gastro-esophageal reflux disease without esophagitis: Secondary | ICD-10-CM | POA: Diagnosis not present

## 2020-10-07 DIAGNOSIS — E669 Obesity, unspecified: Secondary | ICD-10-CM | POA: Diagnosis not present

## 2020-10-07 DIAGNOSIS — I350 Nonrheumatic aortic (valve) stenosis: Secondary | ICD-10-CM | POA: Diagnosis not present

## 2020-10-07 DIAGNOSIS — I2510 Atherosclerotic heart disease of native coronary artery without angina pectoris: Secondary | ICD-10-CM | POA: Diagnosis not present

## 2020-10-07 DIAGNOSIS — I739 Peripheral vascular disease, unspecified: Secondary | ICD-10-CM | POA: Diagnosis not present

## 2020-10-08 ENCOUNTER — Telehealth: Payer: Self-pay | Admitting: Internal Medicine

## 2020-10-08 DIAGNOSIS — E039 Hypothyroidism, unspecified: Secondary | ICD-10-CM

## 2020-10-08 NOTE — Telephone Encounter (Signed)
Yes ok to change,  Repeat TSH in 6 week s  Thank you!

## 2020-10-08 NOTE — Telephone Encounter (Signed)
[place] called and wanted to know if it was ok to change the manufacturer on patients  levothyroxine (SYNTHROID) 50 MCG tablet. Please call pharmacy.

## 2020-10-08 NOTE — Telephone Encounter (Signed)
Is it okay to change manufacturer and then schedule lab appt for 6 weeks to have TSH rechecked?

## 2020-10-10 NOTE — Telephone Encounter (Signed)
Called and spoke to [place] and gave the verbal ok to change the medication manufacturer. Called and spoke to [place] and scheduled for repeat Labs in 6 weeks. TSH has been ordered for future labs.

## 2020-10-13 ENCOUNTER — Telehealth: Payer: Self-pay

## 2020-10-13 NOTE — Telephone Encounter (Signed)
That is not a typical side effect of levothyroxine,  And I know of no recall for the medication, unless it was too strong of a dose.  Agree with stopping medication,  And recheck thyroid labs at her leisure.    lab to draw [place]

## 2020-10-13 NOTE — Telephone Encounter (Signed)
Spoke with pt and scheduled her for a lab appt. Pt is aware of message and appt.

## 2020-10-13 NOTE — Telephone Encounter (Signed)
Pt sent a message stating that she stopped her thyroid medication because it was causing problems with her throat. Called pt she that her throat started hurting and described it as "you know when you run a lot and your throat can start hurting or you feel like it is difficult to breath". Pt stated that she spoke with the pharmacist about this and he stated that he has several pt's tell him that. Pt stated that since she has stopped the medication she has not had the problem anymore. Pt is wanting to know if there is anything else that she can take.

## 2020-10-21 ENCOUNTER — Other Ambulatory Visit: Payer: Self-pay

## 2020-10-21 ENCOUNTER — Other Ambulatory Visit: Payer: Medicare Other

## 2020-10-30 ENCOUNTER — Other Ambulatory Visit (INDEPENDENT_AMBULATORY_CARE_PROVIDER_SITE_OTHER): Payer: Medicare Other

## 2020-10-30 ENCOUNTER — Other Ambulatory Visit: Payer: Self-pay

## 2020-10-30 DIAGNOSIS — N1831 Chronic kidney disease, stage 3a: Secondary | ICD-10-CM

## 2020-10-30 DIAGNOSIS — E039 Hypothyroidism, unspecified: Secondary | ICD-10-CM

## 2020-10-31 LAB — TSH: TSH: 8.49 u[IU]/mL — ABNORMAL HIGH (ref 0.35–4.50)

## 2020-11-01 ENCOUNTER — Other Ambulatory Visit: Payer: Self-pay | Admitting: Internal Medicine

## 2020-11-01 MED ORDER — LEVOTHYROXINE SODIUM 75 MCG PO TABS: 75.0000 ug | ORAL_TABLET | Freq: Every day | ORAL | 1 refills | Status: DC

## 2020-11-01 NOTE — Addendum Note (Signed)
Addended by: Crecencio Mc on: 11/01/2020 04:26 PM   Modules accepted: Orders

## 2020-11-03 ENCOUNTER — Telehealth: Payer: Self-pay | Admitting: Internal Medicine

## 2020-11-03 NOTE — Telephone Encounter (Signed)
Pt stopped taking this medication because of side effects that it was causing. Is there another medication that pt can take instead of the levothyroxine?

## 2020-11-03 NOTE — Telephone Encounter (Signed)
Pt would like a call back about the levothyroxine (SYNTHROID) 75 MCG tablet she discontinued this medication   Please call her back

## 2020-11-04 MED ORDER — THYROID 15 MG PO TABS: 15.0000 mg | ORAL_TABLET | Freq: Every day | ORAL | 1 refills | Status: DC

## 2020-11-04 NOTE — Telephone Encounter (Signed)
[place] thyroid sent to pharmacy [place].  PLEASE START PA IF POSSIBLE  SIDE EFFECTS OF THROAT PAIN WITH LEVOTHYROXINE

## 2020-11-04 NOTE — Addendum Note (Signed)
Addended by: Crecencio Mc on: 11/04/2020 10:09 AM   Modules accepted: Orders

## 2020-11-06 NOTE — Telephone Encounter (Signed)
PA has been submitted through covermymeds.

## 2020-11-10 ENCOUNTER — Other Ambulatory Visit: Payer: Self-pay | Admitting: Internal Medicine

## 2020-11-13 ENCOUNTER — Telehealth: Payer: Self-pay

## 2020-11-13 ENCOUNTER — Telehealth: Payer: Self-pay | Admitting: Internal Medicine

## 2020-11-13 DIAGNOSIS — G894 Chronic pain syndrome: Secondary | ICD-10-CM

## 2020-11-13 DIAGNOSIS — M1611 Unilateral primary osteoarthritis, right hip: Secondary | ICD-10-CM

## 2020-11-13 DIAGNOSIS — M47816 Spondylosis without myelopathy or radiculopathy, lumbar region: Secondary | ICD-10-CM

## 2020-11-13 DIAGNOSIS — G8929 Other chronic pain: Secondary | ICD-10-CM

## 2020-11-13 DIAGNOSIS — M25551 Pain in right hip: Secondary | ICD-10-CM

## 2020-11-13 DIAGNOSIS — M47812 Spondylosis without myelopathy or radiculopathy, cervical region: Secondary | ICD-10-CM

## 2020-11-13 DIAGNOSIS — M48062 Spinal stenosis, lumbar region with neurogenic claudication: Secondary | ICD-10-CM

## 2020-11-13 DIAGNOSIS — M25552 Pain in left hip: Secondary | ICD-10-CM

## 2020-11-13 MED ORDER — FENTANYL 50 MCG/HR TD PT72: 1.0000 | MEDICATED_PATCH | TRANSDERMAL | 0 refills | Status: DC

## 2020-11-13 NOTE — Telephone Encounter (Signed)
Pt states that she needs a refill on fentaNYL (DURAGESIC) 50 MCG/HR. She states that she needs to change it today but she does not have a refill. Please send to [facility] on [address]

## 2020-11-13 NOTE — Addendum Note (Signed)
Addended by: Crecencio Mc on: 11/13/2020 05:33 PM   Modules accepted: Orders

## 2020-11-13 NOTE — Telephone Encounter (Signed)
Thanks I have sent in a full prescription for 30 days

## 2020-11-13 NOTE — Telephone Encounter (Signed)
Pt states that she needs a refill on fentaNYL (DURAGESIC) 50 MCG/HR. She states that she needs to change it today but she does not have a refill. Please send to [facility] on [address]     Will leave further refills for PCP filled 3 patches

## 2020-11-13 NOTE — Telephone Encounter (Signed)
Call and cancel my 3 patches fentanyl please

## 2020-11-14 NOTE — Telephone Encounter (Signed)
[place] Night - Cl TELEPHONE ADVICE RECORD [facility] Patient Name: Sabrina [name] Gender: Female DOB: Nov 07, 1940 Age: 80 Y 73 M 25 D Return Phone Number: [phone] (Primary) Address: City/State/Zip: [place] [address] Client [facility] [place] Night - Cl Client Site [place] Physician McClean-Scocuzza, Olivia Mackie Contact Type Call Who Is [facility] Call Type Pharmacy Send to RN Chief Complaint Paging or Request for Consult Reason for Call Request to change medication order Initial Comment Caller is from [place] calling to change a medication order. He states Dr. Marigene Ehlers ordered the patient 3 fentanyl patches, but [place] doesn't allow the pharmacy to break open the box, so he needs the presctiption to say she is prescribed 5 fentanyl patches which would be the whole box. Pharmacy Name [facility] Pharmacist Name [place] Number [phone] Translation No Nurse Assessment Nurse: Hassell Done, RN, Joelene Millin Date/Time Eilene Ghazi Time): 11/13/2020 6:13:57 PM Confirm and document reason for call. If symptomatic, describe symptoms. ---pharmacist says he already got Rx from dr. Does the patient have any new or worsening symptoms? ---No Disp. Time Eilene Ghazi Time) Disposition Final User 11/13/2020 6:14:22 PM Clinical Call Yes Hassell Done, RN, Joelene Millin

## 2020-11-14 NOTE — Telephone Encounter (Signed)
Pt called and said that she went to get Fentanyl patch and they told her it was $84 which it is normally $17. She said the pharmacy told her insurance didn't pay because she is already taking a medication that is interfering with what she is taking. I asked her what medication was interfering with her fentanyl patches and she said they didn't tell her. She said she really needs her patches and would like for [place] to help her get her insurance to pay for it.

## 2020-11-14 NOTE — Telephone Encounter (Signed)
LMTCB

## 2020-11-14 NOTE — Telephone Encounter (Signed)
Called and spoke with Pharmacy per pharmacy no medication is interfering with her ability to get her meds her new copay for this year is 84 dollars.

## 2020-11-14 NOTE — Telephone Encounter (Signed)
PCP ordered please cancel my 3 patches  Thank you

## 2020-11-14 NOTE — Telephone Encounter (Signed)
Dr. Mclean-Scocuzza's rx for the fentanyl patch has been canceled.

## 2020-11-17 NOTE — Telephone Encounter (Signed)
Spoke with pt and she stated that she did pick up the fentanyl patch but has not bee able to get the thyroid medicine refilled. Pt stated that they would not fill it because it had an interaction with one of the other medicines that she was already taking. Attempted to call pharmacy and phone just kept ringing. Will try again later.

## 2020-11-17 NOTE — Telephone Encounter (Signed)
Pt returned your call.  

## 2020-11-18 NOTE — Telephone Encounter (Signed)
Spoke with pt to let her know that I spoke directly with the pharmacist and she stated that there is not an contraindication with any of the medications she takes. Told pt that it is safe for her to go ahead and take the Armour thyroid medicine and to make sure she takes it in the morning with a full glass of water at least 30 minutes before eating anything. Pt gave a verbal understanding.

## 2020-11-18 NOTE — Telephone Encounter (Signed)
Pt would like you to call her so she knows what to do about her medication

## 2020-11-20 ENCOUNTER — Telehealth: Payer: Self-pay

## 2020-11-20 NOTE — Telephone Encounter (Signed)
PA for Armour Thyroid has been approved throught 10/24/2021.

## 2020-11-25 ENCOUNTER — Ambulatory Visit (INDEPENDENT_AMBULATORY_CARE_PROVIDER_SITE_OTHER): Payer: Medicare Other | Admitting: Internal Medicine

## 2020-11-25 ENCOUNTER — Other Ambulatory Visit: Payer: Medicare Other

## 2020-11-25 ENCOUNTER — Encounter: Payer: Self-pay | Admitting: Internal Medicine

## 2020-11-25 ENCOUNTER — Other Ambulatory Visit: Payer: Self-pay

## 2020-11-25 VITALS — BP 148/100 | HR 92 | Temp 97.9°F | Ht 60.98 in | Wt 170.8 lb

## 2020-11-25 DIAGNOSIS — E669 Obesity, unspecified: Secondary | ICD-10-CM | POA: Diagnosis not present

## 2020-11-25 DIAGNOSIS — M48062 Spinal stenosis, lumbar region with neurogenic claudication: Secondary | ICD-10-CM

## 2020-11-25 DIAGNOSIS — E039 Hypothyroidism, unspecified: Secondary | ICD-10-CM

## 2020-11-25 DIAGNOSIS — R0683 Snoring: Secondary | ICD-10-CM

## 2020-11-25 DIAGNOSIS — E66811 Obesity, class 1: Secondary | ICD-10-CM | POA: Insufficient documentation

## 2020-11-25 DIAGNOSIS — R5383 Other fatigue: Secondary | ICD-10-CM

## 2020-11-25 DIAGNOSIS — F1190 Opioid use, unspecified, uncomplicated: Secondary | ICD-10-CM

## 2020-11-25 MED ORDER — FENTANYL 50 MCG/HR TD PT72: 1.0000 | MEDICATED_PATCH | TRANSDERMAL | 0 refills | Status: DC

## 2020-11-25 NOTE — Patient Instructions (Addendum)
1) Your thyroid is only slightly underactive  .  Stop the armour thyroid pill , and do not resume the levothyroxine .  We will not treat your thyroid condition unless you develop symptoms other than fatigue  2)  Your diet is making you gain weight because it has too much sugar in it.    Try this diet for 2 weeks:  Diet:   Breakfast:  toasted bagel thin with cream cheese instead of cereal and banana. NO BANANAS!  Try a Proofreader  instead of nabs and COKE   Dinner :  You need a green veggie  To go with your chicken.  Reduce your bread: One slice of bread.     Dessert:   No ice cream!  Try the  [employer] version of the  [employer] n Fit greek yogurt ,  Add canned whipped cream    3) Home sleep study ordered

## 2020-11-25 NOTE — Assessment & Plan Note (Signed)
No response to [facility] , multiple attempts. Not willing to consider decompressive surgery.  Continue pain management with Fentanyl and tramadol.  No changes today

## 2020-11-25 NOTE — Assessment & Plan Note (Signed)
Sleep apnea suspected given history of snoring

## 2020-11-25 NOTE — Assessment & Plan Note (Addendum)
She declines nutritional consult/referral.  Diet reviewed in detail and changes advised In writing

## 2020-11-25 NOTE — Assessment & Plan Note (Signed)
She is intolerant of levothyroxine and armour.  Will not treat.

## 2020-11-25 NOTE — Assessment & Plan Note (Signed)
She  has constant pain that has not improved with ESI   Continue duragesic  .  Did not feel that tramadol or tylenol #4 helped .

## 2020-11-25 NOTE — Progress Notes (Signed)
Subjective:  Patient ID: Sabrina [name], female    DOB: January 30, 1941  Age: 80 y.o. MRN: [id number]  CC: The primary encounter diagnosis was Snoring. Diagnoses of Obesity (BMI 30.0-34.9), Acquired hypothyroidism, Lumbar stenosis with neurogenic claudication, Chronic narcotic use, and Fatigue, unspecified type were also pertinent to this visit.  HPI Sabrina [name] presents for evaluation of multiple chronic issues .  This visit occurred during the SARS-CoV-2 public health emergency.  Safety protocols were in place, including screening questions prior to the visit, additional usage of staff PPE, and extensive cleaning of exam room while observing appropriate contact time as indicated for disinfecting solutions.    1) Hypothyroid ;  Patient states that she is intolerant of levothyroxine and stopped it on or around Jan 10 due to feeling short of breath since she started taking it.  She asked for an alternative and started taking   Armour thyroid 15 mg which was prescribed after  We received  PA on Jan 27, but she states that  it is causing the same symptoms.    She has not stopped the armour thyroid  Medication yet.   2) Fatigue , lack of motivation.  The complaint is chronic but getting worse.  She states that she is gets up for meals  But otherwise  is spending most of her day in bed. Denies pain as the cause  And denies depression "I just don't feel like it."  Refuses antidepressants. She is now Willing to have a home sleep study   3) Recurrent episodes of wheezing when she exhales, occurs only when she is  lying supine,  Denies dyspnea,  Bothered only by the sound of wheezing.Marland Kitchen  VCD  4)  Obesity: she has gained weight and no longer fits into any of her Sunday clothes due to  increased abdominal girth. "I don't eat anything"  Not physically active.  Diet reviewed .  She eats 'the same thing every day.":  A bowl of Cereal with banana and 2% milk, (does not measure cereal  )  Noon:  A pack  of peanut butter crackers,  Small bottle of Coca cola and glass of water supper : pinto beans,  Baked Chicken breast without skin,  2 slices of bread .  No salad, green vegetables  Nighttime snack:  [employer] Ice cream for dessert  (8 ounce styrofoam cup)  5) Back pain with radiculopathy .  Fentanyl not helping,  Constant pain .  Has seen [facility],  Does not want  To consider surgery  of spinal cord stimulator. Not using tramadol or tylenol #4   Outpatient Medications Prior to Visit  Medication Sig Dispense Refill  . aspirin 81 MG tablet Take 81 mg by mouth daily.    . chlordiazePOXIDE (LIBRIUM) 25 MG capsule TAKE 1 CAPSULE BY MOUTH ONCE DAILY AS NEEDED FOR ANXIETY 30 capsule 5  . clopidogrel (PLAVIX) 75 MG tablet Take 1 tablet by mouth once daily 90 tablet 0  . fentaNYL (DURAGESIC) 50 MCG/HR Place 1 patch onto the skin every 3 (three) days. 10 patch 0  . ferrous sulfate (FEROSUL) 325 (65 FE) MG tablet Take 1 tablet (325 mg total) by mouth daily with breakfast. 90 tablet 0  . hydrALAZINE (APRESOLINE) 50 MG tablet TAKE 1 TABLET BY MOUTH THREE TIMES DAILY AS NEEDED FOR BLOOD PRESSURE GREATER  THAN  150 270 tablet 0  . hydrALAZINE (APRESOLINE) 50 MG tablet TAKE 1 TABLET BY MOUTH THREE TIMES DAILY AS NEEDED FOR BLOOD PRESSURE  GREATER THAN 150 270 tablet 1  . labetalol (NORMODYNE) 200 MG tablet Take 1 tablet by mouth twice daily 180 tablet 0  . lisinopril (ZESTRIL) 20 MG tablet Take 1 tablet by mouth twice daily 180 tablet 0  . lisinopril (ZESTRIL) 20 MG tablet Take 1 tablet (20 mg total) by mouth 2 (two) times daily. 180 tablet 1  . NARCAN 4 MG/0.1ML LIQD nasal spray kit     . nortriptyline (PAMELOR) 10 MG capsule Take 1 capsule (10 mg total) by mouth 2 (two) times daily. 180 capsule 1  . Omega 3 1000 MG CAPS Take 1 capsule by mouth 2 (two) times daily.     . pantoprazole (PROTONIX) 40 MG tablet Take 1 tablet by mouth once daily 30 tablet 0  . ZETIA 10 MG tablet Take 1 tablet by mouth once daily 30  tablet 0  . ZETIA 10 MG tablet Take 1 tablet (10 mg total) by mouth daily. 90 tablet 1  . acetaminophen-codeine (TYLENOL #4) 300-60 MG tablet Take 1 tablet by mouth every 4 (four) hours as needed for moderate pain. 30 tablet 0  . fentaNYL (DURAGESIC) 50 MCG/HR Place 1 patch onto the skin every 3 (three) days. 10 patch 0  . fentaNYL (DURAGESIC) 50 MCG/HR Place 1 patch onto the skin every 3 (three) days. 3 patch 0  . levothyroxine (SYNTHROID) 75 MCG tablet Take 1 tablet (75 mcg total) by mouth daily. 90 tablet 1  . thyroid (ARMOUR THYROID) 15 MG tablet Take 1 tablet (15 mg total) by mouth daily. 90 tablet 1  . traMADol (ULTRAM) 50 MG tablet TAKE 2 TABLETS BY MOUTH EVERY 12 HOURS AS NEEDED. MAX OF 4 TABLETS DAILY. 120 tablet 0   No facility-administered medications prior to visit.    Review of Systems;  Patient denies headache, fevers, malaise, unintentional weight loss, skin rash, eye pain, sinus congestion and sinus pain, sore throat, dysphagia,  hemoptysis , cough, dyspnea, wheezing, chest pain, palpitations, orthopnea, edema, abdominal pain, nausea, melena, diarrhea, constipation, flank pain, dysuria, hematuria, urinary  Frequency, nocturia, numbness, tingling, seizures,  Focal weakness, Loss of consciousness,  Tremor, insomnia, depression, anxiety, and suicidal ideation.      Objective:  BP (!) 148/100 (BP Location: Left Arm, Patient Position: Sitting)   Pulse 92   Temp 97.9 F (36.6 C)   Ht 5' 0.98" (1.549 m)   Wt 170 lb 12.8 oz (77.5 kg)   SpO2 99%   BMI 32.29 kg/m   BP Readings from Last 3 Encounters:  11/25/20 (!) 148/100  10/02/20 114/74  09/09/20 (!) 162/95    Wt Readings from Last 3 Encounters:  11/25/20 170 lb 12.8 oz (77.5 kg)  10/02/20 171 lb (77.6 kg)  09/09/20 167 lb (75.8 kg)    General appearance: alert, cooperative and appears stated age Ears: normal TM's and external ear canals both ears Throat: lips, mucosa, and tongue normal; teeth and gums normal Neck:  no adenopathy, no carotid bruit, supple, symmetrical, trachea midline and thyroid not enlarged, symmetric, no tenderness/mass/nodules Back: symmetric, no curvature. ROM normal. No CVA tenderness. Lungs: clear to auscultation bilaterally Heart: regular rate and rhythm, S1, S2 normal, no murmur, click, rub or gallop Abdomen: soft, non-tender; bowel sounds normal; no masses,  no organomegaly Pulses: 2+ and symmetric Skin: Skin color, texture, turgor normal. No rashes or lesions Lymph nodes: Cervical, supraclavicular, and axillary nodes normal.  Lab Results  Component Value Date   HGBA1C 5.9 07/19/2016   HGBA1C 5.8 06/02/2015  Lab Results  Component Value Date   CREATININE 1.24 (H) 09/09/2020   CREATININE 1.36 (H) 07/29/2020   CREATININE 1.21 (H) 03/10/2020    Lab Results  Component Value Date   WBC 5.2 09/09/2020   HGB 11.4 (L) 09/09/2020   HCT 33.7 (L) 09/09/2020   PLT 239 09/09/2020   GLUCOSE 116 (H) 09/09/2020   CHOL 205 (H) 07/29/2020   TRIG 131.0 07/29/2020   HDL 49.10 07/29/2020   LDLDIRECT 109.0 03/05/2016   LDLCALC 130 (H) 07/29/2020   ALT 18 09/09/2020   AST 20 09/09/2020   NA 136 09/09/2020   K 4.7 09/09/2020   CL 99 09/09/2020   CREATININE 1.24 (H) 09/09/2020   BUN 20 09/09/2020   CO2 27 09/09/2020   TSH 8.49 (H) 10/30/2020   INR 1.0 03/27/2014   HGBA1C 5.9 07/19/2016    MM 3D SCREEN BREAST BILATERAL  Result Date: 07/24/2020 CLINICAL DATA:  Screening. EXAM: DIGITAL SCREENING BILATERAL MAMMOGRAM WITH TOMO AND CAD COMPARISON:  Previous exam(s). ACR Breast Density Category c: The breast tissue is heterogeneously dense, which may obscure small masses. FINDINGS: There are no findings suspicious for malignancy. Images were processed with CAD. IMPRESSION: No mammographic evidence of malignancy. A result letter of this screening mammogram will be mailed directly to the patient. RECOMMENDATION: Screening mammogram in one year. (Code:SM-B-01Y) BI-RADS CATEGORY  1:  Negative. Electronically Signed   By: Nolon Nations M.D.   On: 07/24/2020 16:38    Assessment & Plan:   Problem List Items Addressed This Visit      Unprioritized   Chronic narcotic use    She  has constant pain that has not improved with ESI   Continue duragesic  .  Did not feel that tramadol or tylenol #4 helped .        Fatigue    Sleep apnea suspected given history of snoring       Relevant Orders   Home sleep test   Hypothyroid    She is intolerant of levothyroxine and armour.  Will not treat.      Lumbar stenosis with neurogenic claudication    No response to ESI , multiple attempts. Not willing to consider decompressive surgery.  Continue pain management with Fentanyl and tramadol.  No changes today       Relevant Medications   fentaNYL (DURAGESIC) 50 MCG/HR (Start on 12/13/2020)   fentaNYL (DURAGESIC) 50 MCG/HR (Start on 01/12/2021)   fentaNYL (DURAGESIC) 50 MCG/HR (Start on 02/11/2021)   Obesity (BMI 30.0-34.9)    She declines nutritional consult/referral.  Diet reviewed in detail and changes advised In writing       Relevant Orders   Home sleep test    Other Visit Diagnoses    Snoring    -  Primary   Relevant Orders   Home sleep test      I have discontinued Safiyyah A. Houchin's traMADol, acetaminophen-codeine, levothyroxine, and thyroid. I am also having her maintain her aspirin, Omega 3, ferrous sulfate, Narcan, chlordiazePOXIDE, hydrALAZINE, hydrALAZINE, nortriptyline, lisinopril, Zetia, Zetia, lisinopril, clopidogrel, labetalol, pantoprazole, fentaNYL, fentaNYL, fentaNYL, and fentaNYL.  Meds ordered this encounter  Medications  . fentaNYL (DURAGESIC) 50 MCG/HR    Sig: Place 1 patch onto the skin every 3 (three) days.    Dispense:  10 patch    Refill:  0  . fentaNYL (DURAGESIC) 50 MCG/HR    Sig: Place 1 patch onto the skin every 3 (three) days.    Dispense:  10 patch  Refill:  0  . fentaNYL (DURAGESIC) 50 MCG/HR    Sig: Place 1 patch onto the  skin every 3 (three) days.    Dispense:  3 patch    Refill:  0    Medications Discontinued During This Encounter  Medication Reason  . thyroid (ARMOUR THYROID) 15 MG tablet   . levothyroxine (SYNTHROID) 75 MCG tablet   . fentaNYL (DURAGESIC) 50 MCG/HR Reorder  . fentaNYL (DURAGESIC) 50 MCG/HR   . acetaminophen-codeine (TYLENOL #4) 300-60 MG tablet   . traMADol (ULTRAM) 50 MG tablet   A total of 40 minutes was spent with patient more than half of which was spent in counseling patient on the above mentioned issues , reviewing and explaining recent labs and imaging studies done, and coordination of care.  Follow-up: No follow-ups on file.   Crecencio Mc, MD

## 2020-11-28 ENCOUNTER — Telehealth: Payer: Self-pay

## 2020-11-28 NOTE — Telephone Encounter (Signed)
Prior authorization for Fentanyl patch was denied by Medicare Part D Prescription Drug Coverage. Called to notify Sabrina [name] and she states that she already received the Fentanyl patch.

## 2020-12-02 ENCOUNTER — Telehealth: Payer: Self-pay

## 2020-12-02 NOTE — Telephone Encounter (Signed)
Pt brought in an envelope for Dr Derrel Nip from [facility] in folder up front

## 2020-12-04 ENCOUNTER — Other Ambulatory Visit: Payer: Self-pay

## 2020-12-04 NOTE — Telephone Encounter (Signed)
Patient dropped off a packet from [place] for the PA on Fentanyl patches that was denied.

## 2020-12-05 NOTE — Telephone Encounter (Signed)
Phone appeal has been placed,  ReF number [id number] number to call for updates [phone] Option 3.

## 2020-12-09 ENCOUNTER — Telehealth: Payer: Self-pay | Admitting: Internal Medicine

## 2020-12-09 NOTE — Telephone Encounter (Signed)
Patient called want Dr.Tullo to call [facility] [phone] she state that Dr.Tullo has to call so she can get her medication

## 2020-12-09 NOTE — Telephone Encounter (Signed)
Appeal form has been placed in red folder for completion.

## 2020-12-10 NOTE — Telephone Encounter (Signed)
The fentanyl patches are on file at her pharmacy for feb 19,  March and April

## 2020-12-10 NOTE — Telephone Encounter (Signed)
Pt called she is follow up on appeals form  And wanted the patches called into [facility] she is done to 1 patch

## 2020-12-11 NOTE — Telephone Encounter (Signed)
Sent pt and mychart message letting her know that there is refills on file at pharmacy.

## 2020-12-12 ENCOUNTER — Telehealth: Payer: Self-pay | Admitting: Internal Medicine

## 2020-12-12 NOTE — Telephone Encounter (Signed)
Patient dropped insurance papers for Dr. Derrel Nip. Papers are up front in Dr. Lupita Dawn color folder.

## 2020-12-15 NOTE — Telephone Encounter (Signed)
Insurance forms have been completed, signed and faxed.

## 2020-12-17 ENCOUNTER — Other Ambulatory Visit: Payer: Self-pay | Admitting: Internal Medicine

## 2020-12-18 NOTE — Telephone Encounter (Signed)
Returned call to patient regarding her fentanyl patch she was think ing we still needed to respond to the insurance concerning her medication we do not because we cannot get price eduction when medication is at lowest cost to patient of $17.

## 2020-12-23 ENCOUNTER — Telehealth: Payer: Self-pay | Admitting: Internal Medicine

## 2020-12-23 NOTE — Telephone Encounter (Signed)
Patient called in wanted a call back about her medication she stated that she had to talk with you before she sees Dr.Tullo

## 2020-12-23 NOTE — Telephone Encounter (Signed)
Called pharmacy and spoke with [place] she stated she sees no where a prior authorization is being required for patient fentanyl patch, that the price should be the same next prescription as this the last prescription on 12/23/20,but she could not tell for sure until time for refill. Called and explained to patient at this time there are no holds on her medication.

## 2020-12-30 ENCOUNTER — Ambulatory Visit (INDEPENDENT_AMBULATORY_CARE_PROVIDER_SITE_OTHER): Payer: Medicare Other | Admitting: Internal Medicine

## 2020-12-30 ENCOUNTER — Telehealth: Payer: Self-pay | Admitting: Internal Medicine

## 2020-12-30 ENCOUNTER — Other Ambulatory Visit: Payer: Self-pay

## 2020-12-30 ENCOUNTER — Encounter: Payer: Self-pay | Admitting: Internal Medicine

## 2020-12-30 DIAGNOSIS — D631 Anemia in chronic kidney disease: Secondary | ICD-10-CM | POA: Diagnosis not present

## 2020-12-30 DIAGNOSIS — E039 Hypothyroidism, unspecified: Secondary | ICD-10-CM

## 2020-12-30 DIAGNOSIS — M48062 Spinal stenosis, lumbar region with neurogenic claudication: Secondary | ICD-10-CM

## 2020-12-30 DIAGNOSIS — N1831 Chronic kidney disease, stage 3a: Secondary | ICD-10-CM

## 2020-12-30 DIAGNOSIS — E669 Obesity, unspecified: Secondary | ICD-10-CM | POA: Diagnosis not present

## 2020-12-30 MED ORDER — TRAMADOL HCL 50 MG PO TABS: 50.0000 mg | ORAL_TABLET | Freq: Three times a day (TID) | ORAL | 0 refills | Status: DC | PRN

## 2020-12-30 MED ORDER — FENTANYL 25 MCG/HR TD PT72: 1.0000 | MEDICATED_PATCH | TRANSDERMAL | 0 refills | Status: DC

## 2020-12-30 MED ORDER — ONDANSETRON 4 MG PO TBDP: 4.0000 mg | ORAL_TABLET | Freq: Three times a day (TID) | ORAL | 0 refills | Status: DC | PRN

## 2020-12-30 MED ORDER — THYROID 15 MG PO TABS: 15.0000 mg | ORAL_TABLET | Freq: Every day | ORAL | 2 refills | Status: DC

## 2020-12-30 NOTE — Telephone Encounter (Signed)
Called and cancelled scripts for the fentanyl 50 Mcg as requested. Pharmacy stated they will have to order the 25 mcg Fentanyl. Take 2 days to get the 25 mcg in.

## 2020-12-30 NOTE — Assessment & Plan Note (Signed)
Reviewed dietary choices. . She has limited financial resources, which make overhauling her diet problematic.  encouraged to increase her protein intake and reduce her portion sizes of starches and ice cream

## 2020-12-30 NOTE — Progress Notes (Signed)
Subjective:  Patient ID: Sabrina [name], female    DOB: May 23, 1941  Age: 80 y.o. MRN: [id number]  CC: Diagnoses of Acquired hypothyroidism, Lumbar stenosis with neurogenic claudication, Obesity (BMI 30.0-34.9), and Anemia due to stage 3a chronic kidney disease ([place]) were pertinent to this visit.  HPI Sabrina [name] presents for follow up on multiple issues  This visit occurred during the SARS-CoV-2 public health emergency.  Safety protocols were in place, including screening questions prior to the visit, additional usage of staff PPE, and extensive cleaning of exam room while observing appropriate contact time as indicated for disinfecting solutions.   The first  20 minutes  Of this 40 minute visit was spent conversing with her daughter Sabrina [name] by phone. [name] is her only daughter and lives IN [place],  But called and was given PERMISSION FROM  PATIENT  To discuss her concerns on  MULTIPLE ISSUES:  1)  HER THYROID DISEASE(NOT TAKING MEDICATIONS)   2)   DIET.  FOR WEIGHT LOSS AND 3) PAIN MANAGEMENT WITH fentanyl  1) Hypothyroid: patient has been unable to treat condition due to intolerance of levothyroxine.   2) Diet:  She is concerned tha her mother has not given up her bad eating habits,  Including nightly up ice cream and daily sweets.  3) Pain management:  She has become concerned about her mother's use of fentanyl and is requesting her dose be wened to 25 MCG DAILY .     Patient willing to retry thyroid supplementation  Dicussed armour thyroid trial since she did not tolerate levothyroxine trial    Outpatient Medications Prior to Visit  Medication Sig Dispense Refill  . aspirin 81 MG tablet Take 81 mg by mouth daily.    . chlordiazePOXIDE (LIBRIUM) 25 MG capsule TAKE 1 CAPSULE BY MOUTH ONCE DAILY AS NEEDED FOR ANXIETY 30 capsule 5  . clopidogrel (PLAVIX) 75 MG tablet Take 1 tablet by mouth once daily 90 tablet 0  . ferrous sulfate (FEROSUL) 325 (65 FE) MG tablet Take 1 tablet  (325 mg total) by mouth daily with breakfast. 90 tablet 0  . hydrALAZINE (APRESOLINE) 50 MG tablet TAKE 1 TABLET BY MOUTH THREE TIMES DAILY AS NEEDED FOR BLOOD PRESSURE GREATER THAN 150 270 tablet 1  . labetalol (NORMODYNE) 200 MG tablet Take 1 tablet by mouth twice daily 180 tablet 0  . lisinopril (ZESTRIL) 20 MG tablet Take 1 tablet by mouth twice daily 180 tablet 0  . NARCAN 4 MG/0.1ML LIQD nasal spray kit     . nortriptyline (PAMELOR) 10 MG capsule Take 1 capsule (10 mg total) by mouth 2 (two) times daily. 180 capsule 1  . Omega 3 1000 MG CAPS Take 1 capsule by mouth 2 (two) times daily.     . pantoprazole (PROTONIX) 40 MG tablet Take 1 tablet by mouth once daily 30 tablet 5  . ZETIA 10 MG tablet Take 1 tablet by mouth once daily 30 tablet 0  . fentaNYL (DURAGESIC) 50 MCG/HR Place 1 patch onto the skin every 3 (three) days. 10 patch 0  . [START ON 01/12/2021] fentaNYL (DURAGESIC) 50 MCG/HR Place 1 patch onto the skin every 3 (three) days. 10 patch 0  . [START ON 02/11/2021] fentaNYL (DURAGESIC) 50 MCG/HR Place 1 patch onto the skin every 3 (three) days. 3 patch 0  . fentaNYL (DURAGESIC) 50 MCG/HR Place 1 patch onto the skin every 3 (three) days. (Patient not taking: Reported on 12/30/2020) 10 patch 0  . hydrALAZINE (APRESOLINE) 50  MG tablet TAKE 1 TABLET BY MOUTH THREE TIMES DAILY AS NEEDED FOR BLOOD PRESSURE GREATER  THAN  150 (Patient not taking: Reported on 12/30/2020) 270 tablet 0  . lisinopril (ZESTRIL) 20 MG tablet Take 1 tablet (20 mg total) by mouth 2 (two) times daily. (Patient not taking: Reported on 12/30/2020) 180 tablet 1  . ZETIA 10 MG tablet Take 1 tablet (10 mg total) by mouth daily. (Patient not taking: Reported on 12/30/2020) 90 tablet 1   No facility-administered medications prior to visit.    Review of Systems;  Patient denies headache, fevers, malaise, unintentional weight loss, skin rash, eye pain, sinus congestion and sinus pain, sore throat, dysphagia,  hemoptysis , cough,  dyspnea, wheezing, chest pain, palpitations, orthopnea, edema, abdominal pain, nausea, melena, diarrhea, constipation, flank pain, dysuria, hematuria, urinary  Frequency, nocturia, numbness, tingling, seizures,  Focal weakness, Loss of consciousness,  Tremor, insomnia, depression, anxiety, and suicidal ideation.      Objective:  BP (!) 140/94 (BP Location: Left Arm, Patient Position: Sitting, Cuff Size: Normal)   Pulse (!) 58   Temp 98.2 F (36.8 C) (Oral)   Resp 16   Ht 5' (1.524 m)   Wt 168 lb 9.6 oz (76.5 kg)   SpO2 99%   BMI 32.93 kg/m   BP Readings from Last 3 Encounters:  12/30/20 (!) 140/94  11/25/20 (!) 148/100  10/02/20 114/74    Wt Readings from Last 3 Encounters:  12/30/20 168 lb 9.6 oz (76.5 kg)  11/25/20 170 lb 12.8 oz (77.5 kg)  10/02/20 171 lb (77.6 kg)    General appearance: alert, cooperative and appears stated age Ears: normal TM's and external ear canals both ears Throat: lips, mucosa, and tongue normal; teeth and gums normal Neck: no adenopathy, no carotid bruit, supple, symmetrical, trachea midline and thyroid not enlarged, symmetric, no tenderness/mass/nodules Back: symmetric, no curvature. ROM normal. No CVA tenderness. Lungs: clear to auscultation bilaterally Heart: regular rate and rhythm, S1, S2 normal, no murmur, click, rub or gallop Abdomen: soft, non-tender; bowel sounds normal; no masses,  no organomegaly Pulses: 2+ and symmetric Skin: Skin color, texture, turgor normal. No rashes or lesions Lymph nodes: Cervical, supraclavicular, and axillary nodes normal.  Lab Results  Component Value Date   HGBA1C 5.9 07/19/2016   HGBA1C 5.8 06/02/2015    Lab Results  Component Value Date   CREATININE 1.24 (H) 09/09/2020   CREATININE 1.36 (H) 07/29/2020   CREATININE 1.21 (H) 03/10/2020    Lab Results  Component Value Date   WBC 5.2 09/09/2020   HGB 11.4 (L) 09/09/2020   HCT 33.7 (L) 09/09/2020   PLT 239 09/09/2020   GLUCOSE 116 (H) 09/09/2020    CHOL 205 (H) 07/29/2020   TRIG 131.0 07/29/2020   HDL 49.10 07/29/2020   LDLDIRECT 109.0 03/05/2016   LDLCALC 130 (H) 07/29/2020   ALT 18 09/09/2020   AST 20 09/09/2020   NA 136 09/09/2020   K 4.7 09/09/2020   CL 99 09/09/2020   CREATININE 1.24 (H) 09/09/2020   BUN 20 09/09/2020   CO2 27 09/09/2020   TSH 8.49 (H) 10/30/2020   INR 1.0 03/27/2014   HGBA1C 5.9 07/19/2016    MM 3D SCREEN BREAST BILATERAL  Result Date: 07/24/2020 CLINICAL DATA:  Screening. EXAM: DIGITAL SCREENING BILATERAL MAMMOGRAM WITH TOMO AND CAD COMPARISON:  Previous exam(s). ACR Breast Density Category c: The breast tissue is heterogeneously dense, which may obscure small masses. FINDINGS: There are no findings suspicious for malignancy. Images were processed  with CAD. IMPRESSION: No mammographic evidence of malignancy. A result letter of this screening mammogram will be mailed directly to the patient. RECOMMENDATION: Screening mammogram in one year. (Code:SM-B-01Y) BI-RADS CATEGORY  1: Negative. Electronically Signed   By: Nolon Nations M.D.   On: 07/24/2020 16:38    Assessment & Plan:   Problem List Items Addressed This Visit      Unprioritized   Anemia    Iron stores were normal per hematology but would need increasing prior to Epogen shot which is being considered given persistent anemia. Rechecking today       Hypothyroid    Retrial  of armour at lowest dose.       Relevant Medications   thyroid (ARMOUR THYROID) 15 MG tablet   Lumbar stenosis with neurogenic claudication    Reducing fentanyl to 25 mcg after discussion with daughter and patient.  Anticipating withdrawal symptoms,  Will prescribe zofran prn for nausea and tramadol for breakthrough pain       Relevant Medications   fentaNYL (DURAGESIC) 25 MCG/HR   traMADol (ULTRAM) 50 MG tablet   Obesity (BMI 30.0-34.9)    Reviewed dietary choices. . She has limited financial resources, which make overhauling her diet problematic.  encouraged to  increase her protein intake and reduce her portion sizes of starches and ice cream         I have discontinued Samanta A. Cantara's fentaNYL, fentaNYL, and fentaNYL. I am also having her start on thyroid, ondansetron, fentaNYL, and traMADol. Additionally, I am having her maintain her aspirin, Omega 3, ferrous sulfate, Narcan, chlordiazePOXIDE, hydrALAZINE, nortriptyline, lisinopril, Zetia, clopidogrel, labetalol, and pantoprazole.  Meds ordered this encounter  Medications  . thyroid (ARMOUR THYROID) 15 MG tablet    Sig: Take 1 tablet (15 mg total) by mouth daily.    Dispense:  30 tablet    Refill:  2  . ondansetron (ZOFRAN ODT) 4 MG disintegrating tablet    Sig: Take 1 tablet (4 mg total) by mouth every 8 (eight) hours as needed for nausea or vomiting.    Dispense:  30 tablet    Refill:  0  . fentaNYL (DURAGESIC) 25 MCG/HR    Sig: Place 1 patch onto the skin every 3 (three) days.    Dispense:  10 patch    Refill:  0    REPLACES ALL 50 MCG  PRESCRIPTIONS ON FILE.  . traMADol (ULTRAM) 50 MG tablet    Sig: Take 1 tablet (50 mg total) by mouth every 8 (eight) hours as needed for up to 5 days.    Dispense:  90 tablet    Refill:  0    Medications Discontinued During This Encounter  Medication Reason  . fentaNYL (DURAGESIC) 50 MCG/HR Duplicate  . hydrALAZINE (APRESOLINE) 50 MG tablet Duplicate  . lisinopril (ZESTRIL) 20 MG tablet Duplicate  . ZETIA 10 MG tablet Duplicate  . fentaNYL (DURAGESIC) 50 MCG/HR   . fentaNYL (DURAGESIC) 50 MCG/HR   . fentaNYL (DURAGESIC) 50 MCG/HR    A total of 40 minutes was spent with patient more than half of which was spent in counseling patient on the above mentioned issues , reviewing and explaining recent labs and imaging studies done, and coordination of care. Follow-up: Return in about 4 weeks (around 01/27/2021).   Crecencio Mc, MD

## 2020-12-30 NOTE — Assessment & Plan Note (Signed)
Iron stores were normal per hematology but would need increasing prior to Epogen shot which is being considered given persistent anemia. Rechecking today

## 2020-12-30 NOTE — Assessment & Plan Note (Signed)
Retrial  of armour at lowest dose.

## 2020-12-30 NOTE — Assessment & Plan Note (Addendum)
Reducing fentanyl to 25 mcg after discussion with daughter and patient.  Anticipating withdrawal symptoms,  Will prescribe zofran prn for nausea and tramadol for breakthrough pain

## 2020-12-30 NOTE — Patient Instructions (Addendum)
I AM STARTING YOU ON ARMOUR THYROID AT THE LOWEST DOSE  15 MG DAILY   FOR YOUR THYROID. IF IT IS TOO EXPENSIVE,  DO NOT ACCEPT IT AND LET ME KNOW SO WE CAN GO BACK TO LEVOTHYROXINE      I HAVE REDUCED YOUR FENTANYL DOSE TO 25 MCG PATCH FOR ONE MONTH.. I HAVE ALSO SENT A PRESCRIPTION FOR GENERIC ZOFRAN (ODANSETRON) TO TAKE IF NEEDED FOR NAUSEA.   USE TRAMADOL IF NEEDED FOR ADDITIONAL PAIN MANAGEMENT   I recommend trying melatonin for your insomnia.  It is not a sedative,  But must be taken on  a regular basis to help your internal clock.  Take every evening after dinner start with 3 mg dose   Max effective dose is 6 mg   You are on the right track with your diet but you need more protein and fewer carbs (starches,  Like oatmeal, cereal and bagels)  Try a healthy choice low carb power bowl for dinner one night. They cost $4 or less

## 2021-01-01 ENCOUNTER — Other Ambulatory Visit (INDEPENDENT_AMBULATORY_CARE_PROVIDER_SITE_OTHER): Payer: Medicare Other

## 2021-01-01 ENCOUNTER — Other Ambulatory Visit: Payer: Self-pay

## 2021-01-01 DIAGNOSIS — D631 Anemia in chronic kidney disease: Secondary | ICD-10-CM

## 2021-01-01 DIAGNOSIS — E039 Hypothyroidism, unspecified: Secondary | ICD-10-CM | POA: Diagnosis not present

## 2021-01-01 DIAGNOSIS — N1831 Chronic kidney disease, stage 3a: Secondary | ICD-10-CM

## 2021-01-02 LAB — IRON,TIBC AND FERRITIN PANEL
%SAT: 23 % (calc) (ref 16–45)
Ferritin: 162 ng/mL (ref 16–288)
Iron: 64 ug/dL (ref 45–160)
TIBC: 280 mcg/dL (calc) (ref 250–450)

## 2021-01-02 LAB — CBC WITH DIFFERENTIAL/PLATELET
Basophils Absolute: 0.1 10*3/uL (ref 0.0–0.1)
Basophils Relative: 1.2 % (ref 0.0–3.0)
Eosinophils Absolute: 0.7 10*3/uL (ref 0.0–0.7)
Eosinophils Relative: 12.2 % — ABNORMAL HIGH (ref 0.0–5.0)
HCT: 33.9 % — ABNORMAL LOW (ref 36.0–46.0)
Hemoglobin: 11.3 g/dL — ABNORMAL LOW (ref 12.0–15.0)
Lymphocytes Relative: 33.2 % (ref 12.0–46.0)
Lymphs Abs: 1.8 10*3/uL (ref 0.7–4.0)
MCHC: 33.3 g/dL (ref 30.0–36.0)
MCV: 84.9 fl (ref 78.0–100.0)
Monocytes Absolute: 0.6 10*3/uL (ref 0.1–1.0)
Monocytes Relative: 10 % (ref 3.0–12.0)
Neutro Abs: 2.4 10*3/uL (ref 1.4–7.7)
Neutrophils Relative %: 43.4 % (ref 43.0–77.0)
Platelets: 222 10*3/uL (ref 150.0–400.0)
RBC: 3.99 Mil/uL (ref 3.87–5.11)
RDW: 13.8 % (ref 11.5–15.5)
WBC: 5.6 10*3/uL (ref 4.0–10.5)

## 2021-01-02 LAB — TSH: TSH: 12.91 u[IU]/mL — ABNORMAL HIGH (ref 0.35–4.50)

## 2021-01-03 ENCOUNTER — Other Ambulatory Visit: Payer: Self-pay | Admitting: Internal Medicine

## 2021-01-03 DIAGNOSIS — E039 Hypothyroidism, unspecified: Secondary | ICD-10-CM

## 2021-01-03 DIAGNOSIS — N183 Chronic kidney disease, stage 3 unspecified: Secondary | ICD-10-CM

## 2021-01-03 NOTE — Assessment & Plan Note (Signed)
Iron stores normal.  SPEP normal.  Thyroid underactive and GFR < 50.   Lab Results  Component Value Date   CREATININE 1.24 (H) 09/09/2020   lasttsh Lab Results  Component Value Date   WBC 5.6 01/01/2021   HGB 11.3 (L) 01/01/2021   HCT 33.9 (L) 01/01/2021   MCV 84.9 01/01/2021   PLT 222.0 01/01/2021

## 2021-01-09 ENCOUNTER — Other Ambulatory Visit: Payer: Self-pay

## 2021-01-09 ENCOUNTER — Other Ambulatory Visit: Payer: Self-pay | Admitting: Internal Medicine

## 2021-01-09 MED ORDER — CLOPIDOGREL BISULFATE 75 MG PO TABS: 75.0000 mg | ORAL_TABLET | Freq: Every day | ORAL | 0 refills | Status: DC

## 2021-01-15 DIAGNOSIS — I10 Essential (primary) hypertension: Secondary | ICD-10-CM

## 2021-01-16 ENCOUNTER — Other Ambulatory Visit: Payer: Self-pay | Admitting: Internal Medicine

## 2021-01-16 DIAGNOSIS — I10 Essential (primary) hypertension: Secondary | ICD-10-CM

## 2021-01-16 MED ORDER — LABETALOL HCL 200 MG PO TABS: 200.0000 mg | ORAL_TABLET | Freq: Two times a day (BID) | ORAL | 0 refills | Status: DC

## 2021-01-16 NOTE — Telephone Encounter (Signed)
PT called and wanted to follow up on refill that needs a PA  Pt would like a call back  She is completely out of medication

## 2021-01-19 ENCOUNTER — Telehealth: Payer: Self-pay | Admitting: *Deleted

## 2021-01-19 MED ORDER — EZETIMIBE 10 MG PO TABS: 10.0000 mg | ORAL_TABLET | Freq: Every day | ORAL | 5 refills | Status: DC

## 2021-01-19 NOTE — Telephone Encounter (Signed)
I have completed a PA for Name Sabrina [name] but patient is completely out of medication, patient insurance will cover Ezetimibe the generic, I could find no notes saying patient cannot take Ezetimibe the generic of Zetia and patient cannot recall why she could not take the generic is ok to order Ezetimibe?

## 2021-01-19 NOTE — Telephone Encounter (Signed)
rx for generic zetia signed and sent to [facility]

## 2021-01-19 NOTE — Telephone Encounter (Signed)
Sabrina [name] Key: [id number] - PA Case ID: [id number] Zetia PA

## 2021-01-19 NOTE — Addendum Note (Signed)
Addended by: Nanci Pina on: 01/19/2021 02:44 PM   Modules accepted: Orders

## 2021-01-19 NOTE — Addendum Note (Signed)
Addended by: Crecencio Mc on: 01/19/2021 04:51 PM   Modules accepted: Orders

## 2021-01-19 NOTE — Telephone Encounter (Signed)
Patient

## 2021-01-19 NOTE — Telephone Encounter (Signed)
Patient called about her medication. She has been out of her medication since Thursday of last week. She would like a call back from [place].

## 2021-01-20 NOTE — Telephone Encounter (Signed)
Patient notified of the generic brand being called in and that insurance had denied the name brand Zetia.Patient voiced understanding and is satisfied with this outcome.

## 2021-01-27 ENCOUNTER — Encounter: Payer: Self-pay | Admitting: Internal Medicine

## 2021-01-27 ENCOUNTER — Ambulatory Visit (INDEPENDENT_AMBULATORY_CARE_PROVIDER_SITE_OTHER): Payer: Medicare Other | Admitting: Internal Medicine

## 2021-01-27 ENCOUNTER — Other Ambulatory Visit: Payer: Self-pay

## 2021-01-27 DIAGNOSIS — N183 Chronic kidney disease, stage 3 unspecified: Secondary | ICD-10-CM | POA: Diagnosis not present

## 2021-01-27 DIAGNOSIS — N1831 Chronic kidney disease, stage 3a: Secondary | ICD-10-CM

## 2021-01-27 DIAGNOSIS — F409 Phobic anxiety disorder, unspecified: Secondary | ICD-10-CM

## 2021-01-27 DIAGNOSIS — G894 Chronic pain syndrome: Secondary | ICD-10-CM | POA: Diagnosis not present

## 2021-01-27 DIAGNOSIS — F5105 Insomnia due to other mental disorder: Secondary | ICD-10-CM

## 2021-01-27 DIAGNOSIS — D631 Anemia in chronic kidney disease: Secondary | ICD-10-CM

## 2021-01-27 MED ORDER — THYROID 15 MG PO TABS: 15.0000 mg | ORAL_TABLET | Freq: Every day | ORAL | 2 refills | Status: DC

## 2021-01-27 MED ORDER — DULOXETINE HCL 20 MG PO CPEP: 20.0000 mg | ORAL_CAPSULE | Freq: Every day | ORAL | 3 refills | Status: DC

## 2021-01-27 MED ORDER — FENTANYL 25 MCG/HR TD PT72
1.0000 | MEDICATED_PATCH | TRANSDERMAL | 0 refills | Status: DC
Start: 2021-01-27 — End: 2021-03-05

## 2021-01-27 NOTE — Patient Instructions (Signed)
Your thyroid medication has been refilled.  We will recheck your thyroid and cholesterol in one month   Continue the fentanyl and tramadol\  Your conditions are all stable.  There are no CURES for chronic diseases, but they are all being MANAGED AND CONTROLLED    I am prescribing "Cymbalta" (duloxetine) for your anxiety that is causing your insomnia,  And it may help you tolerate your pain better as well  .  Take it with dinner

## 2021-01-27 NOTE — Progress Notes (Signed)
Subjective:  Patient ID: Sabrina [name], female    DOB: 1941-10-14  Age: 80 y.o. MRN: [id number]  CC: Diagnoses of Chronic pain syndrome, Anemia due to stage 3a chronic kidney disease ([place]), Anemia of chronic kidney failure, stage 3 (moderate) ([place]), and Insomnia due to anxiety and fear were pertinent to this visit.  HPI Sabrina [name] presents for one month follow up on several issues  Including medication changes made last month   This visit occurred during the SARS-CoV-2 public health emergency.  Safety protocols were in place, including screening questions prior to the visit, additional usage of staff PPE, and extensive cleaning of exam room while observing appropriate contact time as indicated for disinfecting solutions.   She is very dejected and frustrated today.  WE reviewed the labs from last month that she did not understand ,  And reviewed her problem list ("What is being done about my kidney disease?")  40 minutes  was spent  with patient today in total  Issues reviewed today : 1) Hypothyroid: patient has been unable to treat condition due to intolerance of levothyroxine.  armour thyroid was  resumed and tolerating it thus far, except for the bitter taste.  2) Diet:  daughter [name] was concerned that her mother had not given up her bad eating habits,  Including nightly up ice cream and daily sweets.  counselling and suggestions made.  She has lost 3 lbs by giving up ice cream and bananas.   3) Pain management:  [name] had become concerned about her mother's use of fentanyl; her dose was reduced to  25 MCG DAILY  And advised to use tramadol for breakthrough pain .  She has been having a daily headache for the last 3 days.   4) chronic insomnia  FOR THE LAST 3 MONTHS.  LYING AWAKE WATCHING TV UNTIL 2 AM . Worrying about things.  Every sink and toilet in her house is stopped up and told that the roots in the yard trees are causing the problem.     Outpatient Medications Prior to  Visit  Medication Sig Dispense Refill  . aspirin 81 MG tablet Take 81 mg by mouth daily.    . chlordiazePOXIDE (LIBRIUM) 25 MG capsule TAKE 1 CAPSULE BY MOUTH ONCE DAILY AS NEEDED FOR ANXIETY 30 capsule 5  . clopidogrel (PLAVIX) 75 MG tablet Take 1 tablet (75 mg total) by mouth daily. 90 tablet 0  . hydrALAZINE (APRESOLINE) 50 MG tablet TAKE 1 TABLET BY MOUTH THREE TIMES DAILY AS NEEDED FOR BLOOD PRESSURE GREATER THAN 150 270 tablet 1  . labetalol (NORMODYNE) 200 MG tablet Take 1 tablet by mouth twice daily 180 tablet 1  . lisinopril (ZESTRIL) 20 MG tablet Take 1 tablet by mouth twice daily 180 tablet 0  . NARCAN 4 MG/0.1ML LIQD nasal spray kit     . nortriptyline (PAMELOR) 10 MG capsule Take 1 capsule (10 mg total) by mouth 2 (two) times daily. 180 capsule 1  . Omega 3 1000 MG CAPS Take 1 capsule by mouth 2 (two) times daily.     . ondansetron (ZOFRAN ODT) 4 MG disintegrating tablet Take 1 tablet (4 mg total) by mouth every 8 (eight) hours as needed for nausea or vomiting. 30 tablet 0  . pantoprazole (PROTONIX) 40 MG tablet Take 1 tablet by mouth once daily 30 tablet 5  . ZETIA 10 MG tablet Take 1 tablet by mouth once daily 30 tablet 0  . fentaNYL (DURAGESIC) 25 MCG/HR Place  1 patch onto the skin every 3 (three) days. 10 patch 0  . thyroid (ARMOUR THYROID) 15 MG tablet Take 1 tablet (15 mg total) by mouth daily. 30 tablet 2  . ezetimibe (ZETIA) 10 MG tablet Take 1 tablet (10 mg total) by mouth daily. (Patient not taking: Reported on 01/27/2021) 30 tablet 5  . ferrous sulfate (FEROSUL) 325 (65 FE) MG tablet Take 1 tablet (325 mg total) by mouth daily with breakfast. (Patient not taking: Reported on 01/27/2021) 90 tablet 0  . labetalol (NORMODYNE) 200 MG tablet Take 1 tablet (200 mg total) by mouth 2 (two) times daily. (Patient not taking: Reported on 01/27/2021) 180 tablet 0   No facility-administered medications prior to visit.    Review of Systems;  Patient denies headache, fevers, malaise,  unintentional weight loss, skin rash, eye pain, sinus congestion and sinus pain, sore throat, dysphagia,  hemoptysis , cough, dyspnea, wheezing, chest pain, palpitations, orthopnea, edema, abdominal pain, nausea, melena, diarrhea, constipation, flank pain, dysuria, hematuria, urinary  Frequency, nocturia, numbness, tingling, seizures,  Focal weakness, Loss of consciousness,  Tremor, insomnia, depression, anxiety, and suicidal ideation.      Objective:  BP (!) 152/90 (BP Location: Left Arm, Patient Position: Sitting, Cuff Size: Normal)   Pulse 67   Temp 97.8 F (36.6 C) (Oral)   Resp 15   Ht 5' (1.524 m)   Wt 166 lb 6.4 oz (75.5 kg)   SpO2 99%   BMI 32.50 kg/m   BP Readings from Last 3 Encounters:  01/27/21 (!) 152/90  12/30/20 (!) 140/94  11/25/20 (!) 148/100    Wt Readings from Last 3 Encounters:  01/27/21 166 lb 6.4 oz (75.5 kg)  12/30/20 168 lb 9.6 oz (76.5 kg)  11/25/20 170 lb 12.8 oz (77.5 kg)    General appearance: alert, cooperative and appears stated age Ears: normal TM's and external ear canals both ears Throat: lips, mucosa, and tongue normal; teeth and gums normal Neck: no adenopathy, no carotid bruit, supple, symmetrical, trachea midline and thyroid not enlarged, symmetric, no tenderness/mass/nodules Back: symmetric, no curvature. ROM normal. No CVA tenderness. Lungs: clear to auscultation bilaterally Heart: regular rate and rhythm, S1, S2 normal, no murmur, click, rub or gallop Abdomen: soft, non-tender; bowel sounds normal; no masses,  no organomegaly Pulses: 2+ and symmetric Skin: Skin color, texture, turgor normal. No rashes or lesions Lymph nodes: Cervical, supraclavicular, and axillary nodes normal.  Lab Results  Component Value Date   HGBA1C 5.9 07/19/2016   HGBA1C 5.8 06/02/2015    Lab Results  Component Value Date   CREATININE 1.24 (H) 09/09/2020   CREATININE 1.36 (H) 07/29/2020   CREATININE 1.21 (H) 03/10/2020    Lab Results  Component Value  Date   WBC 5.6 01/01/2021   HGB 11.3 (L) 01/01/2021   HCT 33.9 (L) 01/01/2021   PLT 222.0 01/01/2021   GLUCOSE 116 (H) 09/09/2020   CHOL 205 (H) 07/29/2020   TRIG 131.0 07/29/2020   HDL 49.10 07/29/2020   LDLDIRECT 109.0 03/05/2016   LDLCALC 130 (H) 07/29/2020   ALT 18 09/09/2020   AST 20 09/09/2020   NA 136 09/09/2020   K 4.7 09/09/2020   CL 99 09/09/2020   CREATININE 1.24 (H) 09/09/2020   BUN 20 09/09/2020   CO2 27 09/09/2020   TSH 12.91 (H) 01/01/2021   INR 1.0 03/27/2014   HGBA1C 5.9 07/19/2016    MM 3D SCREEN BREAST BILATERAL  Result Date: 07/24/2020 CLINICAL DATA:  Screening. EXAM: DIGITAL SCREENING BILATERAL  MAMMOGRAM WITH TOMO AND CAD COMPARISON:  Previous exam(s). ACR Breast Density Category c: The breast tissue is heterogeneously dense, which may obscure small masses. FINDINGS: There are no findings suspicious for malignancy. Images were processed with CAD. IMPRESSION: No mammographic evidence of malignancy. A result letter of this screening mammogram will be mailed directly to the patient. RECOMMENDATION: Screening mammogram in one year. (Code:SM-B-01Y) BI-RADS CATEGORY  1: Negative. Electronically Signed   By: Nolon Nations M.D.   On: 07/24/2020 16:38    Assessment & Plan:   Problem List Items Addressed This Visit      Unprioritized   RESOLVED: Anemia due to stage 3a chronic kidney disease ([place])    Reviewed with patient today and reassured her that her CKD and anemia are being managed.  Lab Results  Component Value Date   CREATININE 1.24 (H) 09/09/2020   lasttsh Lab Results  Component Value Date   WBC 5.6 01/01/2021   HGB 11.3 (L) 01/01/2021   HCT 33.9 (L) 01/01/2021   MCV 84.9 01/01/2021   PLT 222.0 01/01/2021         Anemia of chronic kidney failure, stage 3 (moderate) ([facility])    hemogloboin is stable and above treatment level.       Chronic pain syndrome    She  has constant pain involving the cervical and lumbar spine that has been managed  suboptimally with fentanyl and tramadol.  We have begun weaning fentanyl down from 50 mcg to 25 mcg dose but requests that we continue 25 mcg patches for another month.  Adding cymbalta as a trial given concurrent anxiety resulting in recurrent insomnia       Relevant Medications   DULoxetine (CYMBALTA) 20 MG capsule   fentaNYL (DURAGESIC) 25 MCG/HR   Insomnia due to anxiety and fear    Trial of cymbalta at 20 mg dose        A total of 40 minutes was spent with patient more than half of which was spent in counseling patient on the above mentioned issues , reviewing and explaining recent labs and imaging studies done, and coordination of care.  I have discontinued Betha A. Koone's ferrous sulfate and ezetimibe. I am also having her start on DULoxetine. Additionally, I am having her maintain her aspirin, Omega 3, Narcan, chlordiazePOXIDE, hydrALAZINE, nortriptyline, lisinopril, Zetia, pantoprazole, ondansetron, clopidogrel, labetalol, thyroid, and fentaNYL.  Meds ordered this encounter  Medications  . thyroid (ARMOUR THYROID) 15 MG tablet    Sig: Take 1 tablet (15 mg total) by mouth daily.    Dispense:  30 tablet    Refill:  2  . DULoxetine (CYMBALTA) 20 MG capsule    Sig: Take 1 capsule (20 mg total) by mouth daily with supper.    Dispense:  30 capsule    Refill:  3  . fentaNYL (DURAGESIC) 25 MCG/HR    Sig: Place 1 patch onto the skin every 3 (three) days.    Dispense:  10 patch    Refill:  0    REPLACES ALL 50 MCG  PRESCRIPTIONS ON FILE.    Medications Discontinued During This Encounter  Medication Reason  . ferrous sulfate (FEROSUL) 325 (65 FE) MG tablet   . labetalol (NORMODYNE) 200 MG tablet   . ezetimibe (ZETIA) 10 MG tablet   . thyroid (ARMOUR THYROID) 15 MG tablet Reorder  . fentaNYL (DURAGESIC) 25 MCG/HR Reorder    Follow-up: Return in about 4 weeks (around 02/24/2021).   Crecencio Mc, MD

## 2021-01-28 NOTE — Assessment & Plan Note (Signed)
Reviewed with patient today and reassured her that her CKD and anemia are being managed.  Lab Results  Component Value Date   CREATININE 1.24 (H) 09/09/2020   lasttsh Lab Results  Component Value Date   WBC 5.6 01/01/2021   HGB 11.3 (L) 01/01/2021   HCT 33.9 (L) 01/01/2021   MCV 84.9 01/01/2021   PLT 222.0 01/01/2021

## 2021-01-28 NOTE — Assessment & Plan Note (Signed)
hemogloboin is stable and above treatment level.

## 2021-01-28 NOTE — Assessment & Plan Note (Signed)
She  has constant pain involving the cervical and lumbar spine that has been managed suboptimally with fentanyl and tramadol.  We have begun weaning fentanyl down from 50 mcg to 25 mcg dose but requests that we continue 25 mcg patches for another month.  Adding cymbalta as a trial given concurrent anxiety resulting in recurrent insomnia

## 2021-01-29 ENCOUNTER — Other Ambulatory Visit: Payer: Self-pay

## 2021-01-29 DIAGNOSIS — F409 Phobic anxiety disorder, unspecified: Secondary | ICD-10-CM | POA: Insufficient documentation

## 2021-01-29 MED ORDER — THYROID 15 MG PO TABS: 15.0000 mg | ORAL_TABLET | Freq: Every day | ORAL | 2 refills | Status: DC

## 2021-01-29 NOTE — Assessment & Plan Note (Signed)
Trial of cymbalta at 20 mg dose

## 2021-02-03 ENCOUNTER — Telehealth: Payer: Self-pay | Admitting: Internal Medicine

## 2021-02-03 ENCOUNTER — Other Ambulatory Visit: Payer: Self-pay

## 2021-02-03 NOTE — Telephone Encounter (Signed)
Refilled: 01/27/2021 Last OV: 01/27/2021 Next OV: not scheduled

## 2021-02-03 NOTE — Telephone Encounter (Signed)
Called and verified patient has her medication you cannot ask for a controled substance by automated system have to speak with pharmacy staff.

## 2021-02-03 NOTE — Telephone Encounter (Signed)
Patient called her pharmacy and they do not have her prescription, fentaNYL ([place]) 25 MCG/HR. , Per [facility] automated system. Patient would like a call back.

## 2021-03-05 ENCOUNTER — Other Ambulatory Visit: Payer: Self-pay

## 2021-03-05 MED ORDER — FENTANYL 25 MCG/HR TD PT72: 1.0000 | MEDICATED_PATCH | TRANSDERMAL | 0 refills | Status: DC

## 2021-03-05 NOTE — Telephone Encounter (Signed)
Refilled: 01/27/2021 Last OV: 01/27/2021 Next OV: not scheduled

## 2021-03-05 NOTE — Telephone Encounter (Signed)
Patient called to let Dr Derrel Nip know she is on her last patch, she thought she was suppose to be on '50mg'$ , she forgot she was on '25mg'$ .

## 2021-03-06 ENCOUNTER — Inpatient Hospital Stay: Payer: Medicare Other | Attending: Oncology

## 2021-03-06 DIAGNOSIS — Z8249 Family history of ischemic heart disease and other diseases of the circulatory system: Secondary | ICD-10-CM | POA: Diagnosis not present

## 2021-03-06 DIAGNOSIS — Z885 Allergy status to narcotic agent status: Secondary | ICD-10-CM | POA: Insufficient documentation

## 2021-03-06 DIAGNOSIS — D509 Iron deficiency anemia, unspecified: Secondary | ICD-10-CM

## 2021-03-06 DIAGNOSIS — Z886 Allergy status to analgesic agent status: Secondary | ICD-10-CM | POA: Insufficient documentation

## 2021-03-06 DIAGNOSIS — E611 Iron deficiency: Secondary | ICD-10-CM | POA: Insufficient documentation

## 2021-03-06 DIAGNOSIS — R5383 Other fatigue: Secondary | ICD-10-CM | POA: Diagnosis not present

## 2021-03-06 DIAGNOSIS — N1831 Chronic kidney disease, stage 3a: Secondary | ICD-10-CM | POA: Diagnosis not present

## 2021-03-06 DIAGNOSIS — Z880 Allergy status to penicillin: Secondary | ICD-10-CM | POA: Insufficient documentation

## 2021-03-06 DIAGNOSIS — Z803 Family history of malignant neoplasm of breast: Secondary | ICD-10-CM | POA: Insufficient documentation

## 2021-03-06 DIAGNOSIS — Z9049 Acquired absence of other specified parts of digestive tract: Secondary | ICD-10-CM | POA: Diagnosis not present

## 2021-03-06 DIAGNOSIS — D631 Anemia in chronic kidney disease: Secondary | ICD-10-CM | POA: Diagnosis not present

## 2021-03-06 DIAGNOSIS — Z888 Allergy status to other drugs, medicaments and biological substances status: Secondary | ICD-10-CM | POA: Insufficient documentation

## 2021-03-06 DIAGNOSIS — Z820 Family history of epilepsy and other diseases of the nervous system: Secondary | ICD-10-CM | POA: Insufficient documentation

## 2021-03-06 DIAGNOSIS — Z79899 Other long term (current) drug therapy: Secondary | ICD-10-CM | POA: Diagnosis not present

## 2021-03-06 DIAGNOSIS — Z87891 Personal history of nicotine dependence: Secondary | ICD-10-CM | POA: Insufficient documentation

## 2021-03-06 DIAGNOSIS — Z833 Family history of diabetes mellitus: Secondary | ICD-10-CM | POA: Insufficient documentation

## 2021-03-06 DIAGNOSIS — G894 Chronic pain syndrome: Secondary | ICD-10-CM | POA: Diagnosis not present

## 2021-03-06 DIAGNOSIS — Z823 Family history of stroke: Secondary | ICD-10-CM | POA: Insufficient documentation

## 2021-03-06 LAB — CBC WITH DIFFERENTIAL/PLATELET
Abs Immature Granulocytes: 0.01 10*3/uL (ref 0.00–0.07)
Basophils Absolute: 0 10*3/uL (ref 0.0–0.1)
Basophils Relative: 1 %
Eosinophils Absolute: 0.8 10*3/uL — ABNORMAL HIGH (ref 0.0–0.5)
Eosinophils Relative: 16 %
HCT: 34.2 % — ABNORMAL LOW (ref 36.0–46.0)
Hemoglobin: 11.4 g/dL — ABNORMAL LOW (ref 12.0–15.0)
Immature Granulocytes: 0 %
Lymphocytes Relative: 34 %
Lymphs Abs: 1.8 10*3/uL (ref 0.7–4.0)
MCH: 28.9 pg (ref 26.0–34.0)
MCHC: 33.3 g/dL (ref 30.0–36.0)
MCV: 86.6 fL (ref 80.0–100.0)
Monocytes Absolute: 0.5 10*3/uL (ref 0.1–1.0)
Monocytes Relative: 10 %
Neutro Abs: 2 10*3/uL (ref 1.7–7.7)
Neutrophils Relative %: 39 %
Platelets: 236 10*3/uL (ref 150–400)
RBC: 3.95 MIL/uL (ref 3.87–5.11)
RDW: 12.8 % (ref 11.5–15.5)
WBC: 5.1 10*3/uL (ref 4.0–10.5)
nRBC: 0 % (ref 0.0–0.2)

## 2021-03-06 LAB — IRON AND TIBC
Iron: 65 ug/dL (ref 28–170)
Saturation Ratios: 23 % (ref 10.4–31.8)
TIBC: 283 ug/dL (ref 250–450)
UIBC: 218 ug/dL

## 2021-03-06 LAB — FERRITIN: Ferritin: 128 ng/mL (ref 11–307)

## 2021-03-09 ENCOUNTER — Encounter: Payer: Self-pay | Admitting: Oncology

## 2021-03-09 ENCOUNTER — Other Ambulatory Visit: Payer: Self-pay

## 2021-03-09 ENCOUNTER — Inpatient Hospital Stay (HOSPITAL_BASED_OUTPATIENT_CLINIC_OR_DEPARTMENT_OTHER): Payer: Medicare Other | Admitting: Oncology

## 2021-03-09 VITALS — BP 134/83 | HR 74 | Temp 97.9°F | Resp 16 | Wt 163.9 lb

## 2021-03-09 DIAGNOSIS — Z79899 Other long term (current) drug therapy: Secondary | ICD-10-CM | POA: Diagnosis not present

## 2021-03-09 DIAGNOSIS — E611 Iron deficiency: Secondary | ICD-10-CM | POA: Diagnosis not present

## 2021-03-09 DIAGNOSIS — D631 Anemia in chronic kidney disease: Secondary | ICD-10-CM

## 2021-03-09 DIAGNOSIS — N1831 Chronic kidney disease, stage 3a: Secondary | ICD-10-CM | POA: Diagnosis not present

## 2021-03-09 DIAGNOSIS — D509 Iron deficiency anemia, unspecified: Secondary | ICD-10-CM

## 2021-03-09 DIAGNOSIS — G894 Chronic pain syndrome: Secondary | ICD-10-CM | POA: Diagnosis not present

## 2021-03-09 DIAGNOSIS — R5383 Other fatigue: Secondary | ICD-10-CM

## 2021-03-09 NOTE — Progress Notes (Signed)
Patient reports having low energy.

## 2021-03-09 NOTE — Progress Notes (Signed)
Hematology/Oncology follow up  note [facility] [phone] [phone]   Patient Care Team: Crecencio Mc, MD as PCP - General (Internal Medicine)  REFERRING PROVIDER: Crecencio Mc, MD REASON FOR VISIT Follow up for management of anemia   HISTORY OF PRESENTING ILLNESS:  Sabrina [name] is a  80 y.o.  female with PMH listed below who was referred to me for evaluation of anemia.  Patient recently had lab work done which revealed anemia with hemoglobin 11.5.   Reviewed patient's previous labs, her hemoglobin ranges from 10.8 to 11.8 in the past.  Reports feeling fatigue.  Patient denies weight loss, easy bruising, hematochezia, hemoptysis.  Has history of CKD and follows up with nephrologist.  Chronic pain syndrome. On Tramadol and fentanyl patch.  INTERVAL HISTORY Sabrina [name] is a 80 y.o. female who has above history reviewed by me today presents for follow up visit for management of anemia.  Patient reports feeling tired.  She has lost weight after diet modification to low-fat. Patient was started on Armour thyroid milligram daily recently by primary care provider.  She has noticed darker urine color since the start of the medication.  Denies any dysuria, fever or flank pain  Review of Systems  Constitutional: Positive for malaise/fatigue. Negative for chills, fever and weight loss.  HENT: Negative for sore throat.   Eyes: Negative for redness.  Respiratory: Negative for cough, shortness of breath and wheezing.   Cardiovascular: Negative for chest pain, palpitations and leg swelling.  Gastrointestinal: Negative for abdominal pain, blood in stool, nausea and vomiting.  Genitourinary: Negative for dysuria.  Musculoskeletal: Positive for back pain. Negative for myalgias.  Skin: Negative for rash.  Neurological: Negative for dizziness, tingling and tremors.  Endo/Heme/Allergies: Does not bruise/bleed easily.   Psychiatric/Behavioral: Negative for hallucinations.    MEDICAL HISTORY:  Past Medical History:  Diagnosis Date  . Amputation of fifth toe, right, traumatic, subsequent encounter ([place]) 06/18/2019  . Anemia of chronic kidney failure   . Anxiety   . CAD (coronary artery disease)   . Carotid artery stenosis   . Chronic kidney disease, stage III (moderate) (HCC)    Followed by Dr. Juleen China  . Hyperlipidemia   . Hypertension   . Renal artery stenosis ([place])   . Secondary hyperparathyroidism ([place])   . 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 ABDOMINAL HYSTERECTOMY  1976  . CAROTID ARTERY ANGIOPLASTY Left   . CAROTID ENDARTERECTOMY Left   . CHOLECYSTECTOMY    . COLONOSCOPY WITH PROPOFOL N/A 08/16/2017   Procedure: COLONOSCOPY WITH PROPOFOL;  Surgeon: [name], MD;  Location: [facility] ENDOSCOPY;  Service: Endoscopy;  Laterality: N/A;  . CORONARY ANGIOPLASTY WITH STENT PLACEMENT  2000  . CORONARY ARTERY BYPASS GRAFT  2000  . CYSTOSCOPY WITH STENT PLACEMENT Bilateral   . ESOPHAGOGASTRODUODENOSCOPY (EGD) WITH PROPOFOL N/A 08/16/2017   Procedure: ESOPHAGOGASTRODUODENOSCOPY (EGD) WITH PROPOFOL;  Surgeon: [name], MD;  Location: [facility] ENDOSCOPY;  Service: Endoscopy;  Laterality: N/A;  . ESOPHAGOGASTRODUODENOSCOPY (EGD) WITH PROPOFOL N/A 06/29/2018   Procedure: ESOPHAGOGASTRODUODENOSCOPY (EGD) WITH PROPOFOL;  Surgeon: Virgel Manifold, MD;  Location: [facility] ENDOSCOPY;  Service: Endoscopy;  Laterality: N/A;  . JOINT REPLACEMENT Bilateral 2000  . RENAL ARTERY ANGIOPLASTY Bilateral mARCH 2015  . TOE AMPUTATION Right    small toe  . TONSILLECTOMY AND ADENOIDECTOMY    . TOTAL  HIP ARTHROPLASTY Left   . TOTAL HIP ARTHROPLASTY Right 2015    SOCIAL HISTORY: Social History   Socioeconomic History  . Marital status: Divorced    Spouse name: Not on file  . Number of  children: Not on file  . Years of education: Not on file  . Highest education level: Not on file  Occupational History  . Occupation: retired  Tobacco Use  . Smoking status: Former Smoker    Quit date: 10/25/1998    Years since quitting: 22.3  . Smokeless tobacco: Never Used  Vaping Use  . Vaping Use: Never used  Substance and Sexual Activity  . Alcohol use: No    Alcohol/week: 0.0 standard drinks  . Drug use: Never  . Sexual activity: Not Currently  Other Topics Concern  . Not on file  Social History Narrative  . Not on file   Social Determinants of Health   Financial Resource Strain: Not on file  Food Insecurity: Not on file  Transportation Needs: Not on file  Physical Activity: Not on file  Stress: Not on file  Social Connections: Not on file  Intimate Partner Violence: Not on file    FAMILY HISTORY: Family History  Problem Relation Age of Onset  . Stroke Mother   . Hypertension Mother   . Diabetes Mother   . Hypertension Father   . Heart disease Sister        MI  . Multiple sclerosis Daughter   . Multiple sclerosis Son   . Cerebral aneurysm Son   . Seizures Son   . Cerebral aneurysm Son   . Breast cancer Paternal Aunt 81    ALLERGIES:  is allergic to citalopram, dilaudid [hydromorphone hcl], hydrochlorothiazide, nsaids, nubain [nalbuphine hcl], penicillins, prasugrel, and statins.  MEDICATIONS:  Current Outpatient Medications  Medication Sig Dispense Refill  . aspirin 81 MG tablet Take 81 mg by mouth daily.    . chlordiazePOXIDE (LIBRIUM) 25 MG capsule TAKE 1 CAPSULE BY MOUTH ONCE DAILY AS NEEDED FOR ANXIETY 30 capsule 5  . clopidogrel (PLAVIX) 75 MG tablet Take 1 tablet (75 mg total) by mouth daily. 90 tablet 0  . DULoxetine (CYMBALTA) 20 MG capsule Take 1 capsule (20 mg total) by mouth daily with supper. 30 capsule 3  . fentaNYL (DURAGESIC) 25 MCG/HR Place 1 patch onto the skin every 3 (three) days. 10 patch 0  . hydrALAZINE (APRESOLINE) 50 MG tablet  TAKE 1 TABLET BY MOUTH THREE TIMES DAILY AS NEEDED FOR BLOOD PRESSURE GREATER THAN 150 270 tablet 1  . labetalol (NORMODYNE) 200 MG tablet Take 1 tablet by mouth twice daily 180 tablet 1  . lisinopril (ZESTRIL) 20 MG tablet Take 1 tablet by mouth twice daily 180 tablet 0  . NARCAN 4 MG/0.1ML LIQD nasal spray kit     . nortriptyline (PAMELOR) 10 MG capsule Take 1 capsule (10 mg total) by mouth 2 (two) times daily. 180 capsule 1  . Omega 3 1000 MG CAPS Take 1 capsule by mouth 2 (two) times daily.     . ondansetron (ZOFRAN ODT) 4 MG disintegrating tablet Take 1 tablet (4 mg total) by mouth every 8 (eight) hours as needed for nausea or vomiting. 30 tablet 0  . pantoprazole (PROTONIX) 40 MG tablet Take 1 tablet by mouth once daily 30 tablet 5  . thyroid (ARMOUR THYROID) 15 MG tablet Take 1 tablet (15 mg total) by mouth daily. 30 tablet 2  . ZETIA 10 MG tablet Take 1 tablet by mouth  once daily 30 tablet 0   No current facility-administered medications for this visit.     PHYSICAL EXAMINATION: ECOG PERFORMANCE STATUS: 1 - Symptomatic but completely ambulatory Vitals:   03/09/21 1310  BP: 134/83  Pulse: 74  Resp: 16  Temp: 97.9 F (36.6 C)   Filed Weights   03/09/21 1310  Weight: 163 lb 14.4 oz (74.3 kg)    Physical Exam Constitutional:      General: She is not in acute distress.    Appearance: She is well-developed.  HENT:     Head: Normocephalic and atraumatic.     Right Ear: External ear normal.     Left Ear: External ear normal.  Eyes:     General: No scleral icterus.    Conjunctiva/sclera: Conjunctivae normal.     Pupils: Pupils are equal, round, and reactive to light.  Cardiovascular:     Rate and Rhythm: Normal rate and regular rhythm.     Heart sounds: No murmur heard.   Pulmonary:     Effort: Pulmonary effort is normal. No respiratory distress.     Breath sounds: Normal breath sounds. No wheezing or rales.  Chest:     Chest wall: No tenderness.  Abdominal:      General: Bowel sounds are normal. There is no distension.     Palpations: Abdomen is soft. There is no mass.     Tenderness: There is no abdominal tenderness.  Musculoskeletal:        General: No deformity. Normal range of motion.     Cervical back: Normal range of motion and neck supple.  Lymphadenopathy:     Cervical: No cervical adenopathy.  Skin:    General: Skin is warm and dry.     Findings: No erythema or rash.  Neurological:     Mental Status: She is alert and oriented to person, place, and time. Mental status is at baseline.     Cranial Nerves: No cranial nerve deficit.     Coordination: Coordination normal.  Psychiatric:        Mood and Affect: Mood normal.      LABORATORY DATA:  I have reviewed the data as listed Lab Results  Component Value Date   WBC 5.1 03/06/2021   HGB 11.4 (L) 03/06/2021   HCT 34.2 (L) 03/06/2021   MCV 86.6 03/06/2021   PLT 236 03/06/2021   Recent Labs    03/10/20 1311 07/29/20 1543 09/09/20 1255  NA 137 137 136  K 4.6 5.1 4.7  CL 102 101 99  CO2 $Re'28 30 27  'iPe$ GLUCOSE 104* 91 116*  BUN $Re'19 19 20  'fJM$ CREATININE 1.21* 1.36* 1.24*  CALCIUM 9.3 9.5 9.2  GFRNONAA 43*  --  45*  GFRAA 50*  --   --   PROT 7.0 6.8 7.2  ALBUMIN 3.7 4.0 3.7  AST $Re'17 16 20  'kbc$ ALT $R'13 13 18  'XN$ ALKPHOS 62 61 59  BILITOT 0.6 0.5 0.7       ASSESSMENT & PLAN:  1. Iron deficiency anemia, unspecified iron deficiency anemia type   2. Anemia due to stage 3a chronic kidney disease (HCC)   3. Other fatigue    #Anemia, History of iron deficiency anemia.-Resolved Labs are reviewed and discussed with patient. Globin has been stable at 11.4. Iron panel showed stable iron storage. No need for erythropoietin therapy at this point. No iron supplementation needed at this point. Continue monitor counts.  Fatigue, this is not proportionate to her anemia level.  Likely  due to her newly diagnosed hypothyroidism.  She has started on Armour thyroid. Dark urine color, etiology  unknown.  Recommend patient to increase oral hydration.  Recommend patient to further discuss with she feels symptoms starts start of thyroid medication    Return of visit: 12 months. Earlie Server, MD, PhD Hematology Oncology [place] at [facility] 03/09/2021

## 2021-03-25 ENCOUNTER — Other Ambulatory Visit: Payer: Self-pay | Admitting: Internal Medicine

## 2021-04-06 ENCOUNTER — Other Ambulatory Visit: Payer: Self-pay | Admitting: Internal Medicine

## 2021-04-06 NOTE — Telephone Encounter (Signed)
Fentanyl last filled 03/05/21 for 10 patches last OV 01/27/21 for chronic pain syndrome.

## 2021-04-07 MED ORDER — CLOPIDOGREL BISULFATE 75 MG PO TABS: 75.0000 mg | ORAL_TABLET | Freq: Every day | ORAL | 1 refills | Status: DC

## 2021-04-08 ENCOUNTER — Other Ambulatory Visit: Payer: Self-pay

## 2021-04-08 MED ORDER — FENTANYL 25 MCG/HR TD PT72: 1.0000 | MEDICATED_PATCH | TRANSDERMAL | 0 refills | Status: DC

## 2021-04-09 ENCOUNTER — Other Ambulatory Visit: Payer: Self-pay

## 2021-04-09 NOTE — Telephone Encounter (Signed)
Pt called in to the office requesting Fentanyl 25 MCG/HR to be sent to another pharmacy. She states that [facility] her medication and was told that Fentanyl is out in the [place] area. She was instructed to call and have a prescription [facility] [address] in [place] with the [employer] included in pharmacy instructions as they need to special order the medication. Charene states that she is on her last patch and needs to change it on Friday. Please advise.

## 2021-04-09 NOTE — Telephone Encounter (Signed)
Pt called in to the office and gave the same information. She states that [facility] to [facility] medication and was told that Fentanyl is out in the [place] area. She was instructed to call and have a [facility] [address] in [place] with the [employer] as they need to special order the medication. Sabrina [name] states that she is on her last patch and needs to change it on Friday. Med Refill Encounter with has been sent to Dr. Derrel Nip.

## 2021-04-09 NOTE — Telephone Encounter (Signed)
Pt called back stating that [facility] [place] [facility] and was informed that they do not have the Fentanyl. She apologizes for the confusion and asks for the script to be sent again to [facility]. I informed her that the medication was sent on 04/08/2021 and instructed her to call [facility] and check if they still have the prescription. Sabrina [name] states that she will [facility] and pick up whatever they have after confirming if the prescription is still available.

## 2021-04-10 MED ORDER — FENTANYL 25 MCG/HR TD PT72: 1.0000 | MEDICATED_PATCH | TRANSDERMAL | 0 refills | Status: DC

## 2021-04-10 NOTE — Telephone Encounter (Signed)
THIS PATIENT IS OUT OF FENTANYL BECAUSE OF PHARMACY ISSUES.   I HAVE JUST SENT THE RX FOR THE THIRD TIME,  [facility] PER LAST MY[facility]ART MESSAGE.    PLEASE CALL [facility] TO MAKE SURE THEY GET IT

## 2021-04-10 NOTE — Telephone Encounter (Signed)
[facility]ed [facility] in [place] and they stated that they did get the rx and they do have them in stock. Called pt to let her know and she stated that that she had spoken and [facility] on [address] and they would be getting their shipment in today. Pt asked that we cancel the [facility] at [facility] in [place] and canceled rx.

## 2021-04-20 MED ORDER — ZETIA 10 MG PO TABS: 10.0000 mg | ORAL_TABLET | Freq: Every day | ORAL | 5 refills | Status: DC

## 2021-04-22 DIAGNOSIS — R011 Cardiac murmur, unspecified: Secondary | ICD-10-CM | POA: Diagnosis not present

## 2021-04-22 DIAGNOSIS — E782 Mixed hyperlipidemia: Secondary | ICD-10-CM | POA: Diagnosis not present

## 2021-04-22 DIAGNOSIS — I208 Other forms of angina pectoris: Secondary | ICD-10-CM | POA: Diagnosis not present

## 2021-04-22 DIAGNOSIS — G4733 Obstructive sleep apnea (adult) (pediatric): Secondary | ICD-10-CM | POA: Diagnosis not present

## 2021-04-22 DIAGNOSIS — R0602 Shortness of breath: Secondary | ICD-10-CM | POA: Diagnosis not present

## 2021-04-22 DIAGNOSIS — I714 Abdominal aortic aneurysm, without rupture: Secondary | ICD-10-CM | POA: Diagnosis not present

## 2021-04-22 DIAGNOSIS — I739 Peripheral vascular disease, unspecified: Secondary | ICD-10-CM | POA: Diagnosis not present

## 2021-04-22 DIAGNOSIS — I2510 Atherosclerotic heart disease of native coronary artery without angina pectoris: Secondary | ICD-10-CM | POA: Diagnosis not present

## 2021-04-22 DIAGNOSIS — I10 Essential (primary) hypertension: Secondary | ICD-10-CM | POA: Diagnosis not present

## 2021-04-22 DIAGNOSIS — I350 Nonrheumatic aortic (valve) stenosis: Secondary | ICD-10-CM | POA: Diagnosis not present

## 2021-04-22 DIAGNOSIS — K219 Gastro-esophageal reflux disease without esophagitis: Secondary | ICD-10-CM | POA: Diagnosis not present

## 2021-04-22 DIAGNOSIS — E669 Obesity, unspecified: Secondary | ICD-10-CM | POA: Diagnosis not present

## 2021-04-24 ENCOUNTER — Encounter: Payer: Self-pay | Admitting: Internal Medicine

## 2021-04-24 ENCOUNTER — Other Ambulatory Visit: Payer: Self-pay

## 2021-04-24 ENCOUNTER — Ambulatory Visit (INDEPENDENT_AMBULATORY_CARE_PROVIDER_SITE_OTHER): Payer: Medicare Other | Admitting: Internal Medicine

## 2021-04-24 VITALS — BP 122/84 | HR 72 | Temp 95.6°F | Resp 16 | Ht 60.0 in | Wt 168.0 lb

## 2021-04-24 DIAGNOSIS — L299 Pruritus, unspecified: Secondary | ICD-10-CM | POA: Diagnosis not present

## 2021-04-24 DIAGNOSIS — L280 Lichen simplex chronicus: Secondary | ICD-10-CM | POA: Diagnosis not present

## 2021-04-24 DIAGNOSIS — I714 Abdominal aortic aneurysm, without rupture, unspecified: Secondary | ICD-10-CM

## 2021-04-24 DIAGNOSIS — E039 Hypothyroidism, unspecified: Secondary | ICD-10-CM | POA: Diagnosis not present

## 2021-04-24 DIAGNOSIS — I10 Essential (primary) hypertension: Secondary | ICD-10-CM | POA: Diagnosis not present

## 2021-04-24 DIAGNOSIS — M47812 Spondylosis without myelopathy or radiculopathy, cervical region: Secondary | ICD-10-CM

## 2021-04-24 DIAGNOSIS — I701 Atherosclerosis of renal artery: Secondary | ICD-10-CM

## 2021-04-24 MED ORDER — THYROID 15 MG PO TABS: 15.0000 mg | ORAL_TABLET | Freq: Every day | ORAL | 2 refills | Status: DC

## 2021-04-24 MED ORDER — NORTRIPTYLINE HCL 10 MG PO CAPS: 10.0000 mg | ORAL_CAPSULE | Freq: Two times a day (BID) | ORAL | 1 refills | Status: DC

## 2021-04-24 MED ORDER — FENTANYL 12 MCG/HR TD PT72: 1.0000 | MEDICATED_PATCH | TRANSDERMAL | 0 refills | Status: DC

## 2021-04-24 MED ORDER — LOSARTAN POTASSIUM 100 MG PO TABS: 100.0000 mg | ORAL_TABLET | Freq: Every day | ORAL | 0 refills | Status: DC

## 2021-04-24 MED ORDER — CLOPIDOGREL BISULFATE 75 MG PO TABS: 75.0000 mg | ORAL_TABLET | Freq: Every day | ORAL | 1 refills | Status: DC

## 2021-04-24 MED ORDER — LABETALOL HCL 200 MG PO TABS: 200.0000 mg | ORAL_TABLET | Freq: Two times a day (BID) | ORAL | 1 refills | Status: DC

## 2021-04-24 MED ORDER — ZETIA 10 MG PO TABS: 10.0000 mg | ORAL_TABLET | Freq: Every day | ORAL | 5 refills | Status: DC

## 2021-04-24 NOTE — Progress Notes (Signed)
Subjective:  Patient ID: Sabrina [name], female    DOB: 08-25-1941  Age: 80 y.o. MRN: [id number]  CC: The primary encounter diagnosis was Itchy skin. Diagnoses of Essential hypertension, Acquired hypothyroidism, Abdominal aortic aneurysm (AAA) without rupture (HCC), Atherosclerosis of renal artery (HCC), Cervical spondylosis without myelopathy, and Lichenified rash were also pertinent to this visit.  HPI Sabrina [name] presents for follow up on chronic pain, hypertension and atherosclerosis  This visit occurred during the SARS-CoV-2 public health emergency.  Safety protocols were in place, including screening questions prior to the visit, additional usage of staff PPE, and extensive cleaning of exam room while observing appropriate contact time as indicated for disinfecting solutions.   Chronic pain secondary to spinal stenosis :  managed with low dose fentanyl transdermal patches   HTN:  Imdur 30 mg added by Dr Juliann Pares because of chest pain brought on by "rushing around "  he diagnosed as angina. Has only taken one dose so far.. made her sweat   Chronic itchy patch of skin right lateral ankle  present  for months.  Leathery feeling.  Also feels she is   losing her  hair,  thinning all over scalp.Marland Kitchen  scalp does not itch     Outpatient Medications Prior to Visit  Medication Sig Dispense Refill   aspirin 81 MG tablet Take 81 mg by mouth daily.     chlordiazePOXIDE (LIBRIUM) 25 MG capsule TAKE 1 CAPSULE BY MOUTH ONCE DAILY AS NEEDED FOR ANXIETY 30 capsule 5   fentaNYL (DURAGESIC) 25 MCG/HR Place 1 patch onto the skin every 3 (three) days. 10 patch 0   hydrALAZINE (APRESOLINE) 50 MG tablet TAKE 1 TABLET BY MOUTH THREE TIMES DAILY AS NEEDED FOR BLOOD PRESSURE GREATER THAN 150 270 tablet 1   isosorbide mononitrate (IMDUR) 30 MG 24 hr tablet Take by mouth.     NARCAN 4 MG/0.1ML LIQD nasal spray kit      Omega 3 1000 MG CAPS Take 1 capsule by mouth 2 (two) times daily.      ondansetron  (ZOFRAN ODT) 4 MG disintegrating tablet Take 1 tablet (4 mg total) by mouth every 8 (eight) hours as needed for nausea or vomiting. 30 tablet 0   pantoprazole (PROTONIX) 40 MG tablet Take 1 tablet by mouth once daily 30 tablet 5   clopidogrel (PLAVIX) 75 MG tablet Take 1 tablet (75 mg total) by mouth daily. 90 tablet 1   DULoxetine (CYMBALTA) 20 MG capsule Take 1 capsule (20 mg total) by mouth daily with supper. 30 capsule 3   labetalol (NORMODYNE) 200 MG tablet Take 1 tablet by mouth twice daily 180 tablet 1   lisinopril (ZESTRIL) 20 MG tablet Take 1 tablet by mouth twice daily 180 tablet 1   nortriptyline (PAMELOR) 10 MG capsule Take 1 capsule (10 mg total) by mouth 2 (two) times daily. 180 capsule 1   thyroid (ARMOUR THYROID) 15 MG tablet Take 1 tablet (15 mg total) by mouth daily. 30 tablet 2   ZETIA 10 MG tablet Take 1 tablet (10 mg total) by mouth daily. 30 tablet 5   clopidogrel (PLAVIX) 75 MG tablet Take 1 tablet by mouth once daily (Patient not taking: Reported on 04/24/2021) 90 tablet 0   No facility-administered medications prior to visit.    Review of Systems;  Patient denies headache, fevers, malaise, unintentional weight loss, skin rash, eye pain, sinus congestion and sinus pain, sore throat, dysphagia,  hemoptysis , cough, dyspnea, wheezing, chest pain, palpitations,  orthopnea, edema, abdominal pain, nausea, melena, diarrhea, constipation, flank pain, dysuria, hematuria, urinary  Frequency, nocturia, numbness, tingling, seizures,  Focal weakness, Loss of consciousness,  Tremor, insomnia, depression, anxiety, and suicidal ideation.      Objective:  BP 122/84 (BP Location: Left Arm, Patient Position: Sitting, Cuff Size: Normal)   Pulse 72   Temp (!) 95.6 F (35.3 C) (Temporal)   Resp 16   Ht 5' (1.524 m)   Wt 168 lb (76.2 kg)   SpO2 97%   BMI 32.81 kg/m   BP Readings from Last 3 Encounters:  04/24/21 122/84  03/09/21 134/83  01/27/21 (!) 152/90    Wt Readings from  Last 3 Encounters:  04/24/21 168 lb (76.2 kg)  03/09/21 163 lb 14.4 oz (74.3 kg)  01/27/21 166 lb 6.4 oz (75.5 kg)    General appearance: alert, cooperative and appears stated age Ears: normal TM's and external ear canals both ears Throat: lips, mucosa, and tongue normal; teeth and gums normal Neck: no adenopathy, no carotid bruit, supple, symmetrical, trachea midline and thyroid not enlarged, symmetric, no tenderness/mass/nodules Back: symmetric, no curvature. ROM normal. No CVA tenderness. Lungs: clear to auscultation bilaterally Heart: regular rate and rhythm, S1, S2 normal, no murmur, click, rub or gallop Abdomen: soft, non-tender; bowel sounds normal; no masses,  no organomegaly Pulses: 2+ and symmetric Skin: right lateral ankle with leathery patch of skin   Skin color, texture, turgor normal. No rashes or lesions Lymph nodes: Cervical, supraclavicular, and axillary nodes normal.  Lab Results  Component Value Date   HGBA1C 5.9 07/19/2016   HGBA1C 5.8 06/02/2015    Lab Results  Component Value Date   CREATININE 1.24 (H) 09/09/2020   CREATININE 1.36 (H) 07/29/2020   CREATININE 1.21 (H) 03/10/2020    Lab Results  Component Value Date   WBC 5.1 03/06/2021   HGB 11.4 (L) 03/06/2021   HCT 34.2 (L) 03/06/2021   PLT 236 03/06/2021   GLUCOSE 116 (H) 09/09/2020   CHOL 205 (H) 07/29/2020   TRIG 131.0 07/29/2020   HDL 49.10 07/29/2020   LDLDIRECT 109.0 03/05/2016   LDLCALC 130 (H) 07/29/2020   ALT 18 09/09/2020   AST 20 09/09/2020   NA 136 09/09/2020   K 4.7 09/09/2020   CL 99 09/09/2020   CREATININE 1.24 (H) 09/09/2020   BUN 20 09/09/2020   CO2 27 09/09/2020   TSH 7.74 (H) 04/24/2021   INR 1.0 03/27/2014   HGBA1C 5.9 07/19/2016    MM 3D SCREEN BREAST BILATERAL  Result Date: 07/24/2020 CLINICAL DATA:  Screening. EXAM: DIGITAL SCREENING BILATERAL MAMMOGRAM WITH TOMO AND CAD COMPARISON:  Previous exam(s). ACR Breast Density Category c: The breast tissue is  heterogeneously dense, which may obscure small masses. FINDINGS: There are no findings suspicious for malignancy. Images were processed with CAD. IMPRESSION: No mammographic evidence of malignancy. A result letter of this screening mammogram will be mailed directly to the patient. RECOMMENDATION: Screening mammogram in one year. (Code:SM-B-01Y) BI-RADS CATEGORY  1: Negative. Electronically Signed   By: Nolon Nations M.D.   On: 07/24/2020 16:38    Assessment & Plan:   Problem List Items Addressed This Visit       Unprioritized   Aortic aneurysm, abdominal ([place])    Monitored annually with duplex by Dr Ronalee Belts.  Last seen in June.  < 4 cm, asymptomatic .  Agree with addition of Imdur to keep BP < 034 systolic       Relevant Medications   isosorbide  mononitrate (IMDUR) 30 MG 24 hr tablet   ZETIA 10 MG tablet   labetalol (NORMODYNE) 200 MG tablet   losartan (COZAAR) 100 MG tablet   Atherosclerosis of renal artery (HCC)    With RAS found and managed with bilateral stents        Relevant Medications   isosorbide mononitrate (IMDUR) 30 MG 24 hr tablet   ZETIA 10 MG tablet   labetalol (NORMODYNE) 200 MG tablet   losartan (COZAAR) 100 MG tablet   Cervical spondylosis without myelopathy    She  has constant pain involving the cervical and lumbar spine that has been managed with fentanyl that has been reduced to  25 mcg dose but requests that we continue to try to wean her from the 25 mcg patches for another month.  She has felt no improvement with cymbalta  On her pain or anxiety and requests that it be stopped        Relevant Medications   fentaNYL (DURAGESIC) 12 MCG/HR   Essential hypertension   Relevant Medications   isosorbide mononitrate (IMDUR) 30 MG 24 hr tablet   ZETIA 10 MG tablet   labetalol (NORMODYNE) 200 MG tablet   losartan (COZAAR) 100 MG tablet   Hypothyroid   Relevant Medications   thyroid (ARMOUR THYROID) 15 MG tablet   labetalol (NORMODYNE) 416 MG tablet    Lichenified rash    Right lateral ankle. 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  Referral to dermatology       Other Visit Diagnoses     Itchy skin    -  Primary   Relevant Orders   Ambulatory referral to Dermatology       I have discontinued Cathline A. Yarbro's DULoxetine and lisinopril. I have also changed her thyroid, Zetia, nortriptyline, labetalol, and clopidogrel. Additionally, I am having her start on losartan and fentaNYL. Lastly, I am having her maintain her aspirin, Omega 3, Narcan, chlordiazePOXIDE, hydrALAZINE, pantoprazole, ondansetron, fentaNYL, and isosorbide mononitrate.  Meds ordered this encounter  Medications   thyroid (ARMOUR THYROID) 15 MG tablet    Sig: Take 1 tablet (15 mg total) by mouth daily. For underactive thyroid    Dispense:  30 tablet    Refill:  2   ZETIA 10 MG tablet    Sig: Take 1 tablet (10 mg total) by mouth daily. For high cholesterol    Dispense:  30 tablet    Refill:  5    KEEP ON FILE FOR FUTURE REFILLS   nortriptyline (PAMELOR) 10 MG capsule    Sig: Take 1 capsule (10 mg total) by mouth 2 (two) times daily. For chronic headaches    Dispense:  180 capsule    Refill:  1   labetalol (NORMODYNE) 200 MG tablet    Sig: Take 1 tablet (200 mg total) by mouth 2 (two) times daily. For hypertension    Dispense:  180 tablet    Refill:  1   losartan (COZAAR) 100 MG tablet    Sig: Take 1 tablet (100 mg total) by mouth daily. For hypertension    Dispense:  90 tablet    Refill:  0   clopidogrel (PLAVIX) 75 MG tablet    Sig: Take 1 tablet (75 mg total) by mouth daily. To prevent strokes    Dispense:  90 tablet    Refill:  1    KEEP ON FILE FOR FUTURE REFILLS   fentaNYL (DURAGESIC) 12 MCG/HR    Sig: Place 1 patch  onto the skin every 3 (three) days.    Dispense:  10 patch    Refill:  0    Medications Discontinued During This Encounter  Medication Reason   clopidogrel (PLAVIX) 75 MG tablet Duplicate   thyroid (ARMOUR  THYROID) 15 MG tablet Reorder   ZETIA 10 MG tablet Reorder   DULoxetine (CYMBALTA) 20 MG capsule    nortriptyline (PAMELOR) 10 MG capsule Reorder   labetalol (NORMODYNE) 200 MG tablet    lisinopril (ZESTRIL) 20 MG tablet    clopidogrel (PLAVIX) 75 MG tablet Reorder   I spent 30 mintutes dedicated to the care of this patient on the date of this encounter to include pre-visit review of her medical history,  counselling on her chronic pain  management,  hypertension and skin ras , and post visit ordering of testing and therapeutics.   Follow-up: No follow-ups on file.   Crecencio Mc, MD

## 2021-04-24 NOTE — Patient Instructions (Addendum)
Changes we made today:   Stop the lisinopril and use losartan 100 mg daily instead   Stop the duloxetine per your request (not helping your pain /anxiety/depression)  Reduced Fentanyl to 12 mcg dose patch  If the hot flashes are worse with Imdur,  suspend it for a week,  then resume to see if it was a coincidence

## 2021-04-27 ENCOUNTER — Other Ambulatory Visit: Payer: Self-pay | Admitting: Internal Medicine

## 2021-04-27 DIAGNOSIS — L280 Lichen simplex chronicus: Secondary | ICD-10-CM | POA: Insufficient documentation

## 2021-04-27 DIAGNOSIS — E039 Hypothyroidism, unspecified: Secondary | ICD-10-CM

## 2021-04-27 MED ORDER — THYROID 30 MG PO TABS: 30.0000 mg | ORAL_TABLET | Freq: Every day | ORAL | 0 refills | Status: DC

## 2021-04-27 NOTE — Assessment & Plan Note (Signed)
With RAS found and managed with bilateral stents

## 2021-04-27 NOTE — Assessment & Plan Note (Signed)
She  has constant pain involving the cervical and lumbar spine that has been managed with fentanyl that has been reduced to  25 mcg dose but requests that we continue to try to wean her from the 25 mcg patches for another month.  She has felt no improvement with cymbalta  On her pain or anxiety and requests that it be stopped

## 2021-04-27 NOTE — Assessment & Plan Note (Signed)
Right lateral ankle. 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  Referral to dermatology

## 2021-04-27 NOTE — Assessment & Plan Note (Signed)
Dose increased to 30 mg armour thyroid for elevated tsh.  Repeat tsh in 6 weeks   Lab Results  Component Value Date   TSH 7.74 (H) 04/24/2021

## 2021-04-27 NOTE — Assessment & Plan Note (Signed)
Monitored annually with duplex by Dr Ronalee Belts.  Last seen in June.  < 4 cm, asymptomatic .  Agree with addition of Imdur to keep BP < 324 systolic

## 2021-04-28 ENCOUNTER — Telehealth: Payer: Self-pay

## 2021-04-28 LAB — THYROID PANEL WITH TSH
Free Thyroxine Index: 1.7 (ref 1.4–3.8)
T3 Uptake: 25 % (ref 22–35)
T4, Total: 6.8 ug/dL (ref 5.1–11.9)
TSH: 7.74 mIU/L — ABNORMAL HIGH (ref 0.40–4.50)

## 2021-04-28 LAB — THYROGLOBULIN LEVEL: Thyroglobulin: 50.9 ng/mL — ABNORMAL HIGH

## 2021-04-28 LAB — THYROID PEROXIDASE ANTIBODY: Thyroperoxidase Ab SerPl-aCnc: <1 IU/mL (ref <9)

## 2021-04-28 LAB — THYROGLOBULIN ANTIBODY: Thyroglobulin Ab: <1 IU/mL (ref <=1)

## 2021-04-28 NOTE — Telephone Encounter (Signed)
A call was made to our office to discuss Sabrina [name]'s Fentanyl medication. The caller did not identify themselves nor ask for identification. They stated they are calling to receive prior authorization for the patients Fentanyl medication and wanted to ask 24 questions to approve the medication. Fentanyl was refilled on 04/24/21 and the caller states that the prior authorization form was confirmed faxed over. I asked the caller to refax over the form so that we can see the prior authorization. The caller agreed and then hung up. Received a fax from [facility] concerning patients Fentanyl. Patient's most recent insurance in chart is BCBS and Medicare.   Called and spoke with [place] and she confirmed that she has not received her Fentanyl medication. I asked to receive the new Insurance Card information and she states that her insurance has not changed and that it is the same as what we have in her chart.  Confirmed her members ID on her BCBS supplement card and Medicare ID number.  BCBS MEMBER ID [id number] GROUP NO [id number]  MEDICARE [id number]  Sumbitted a Prior Authorization for Fentanyl through CoverMyMeds. KEY: Ward forms are stored in the [place] on my desk.

## 2021-05-14 NOTE — Telephone Encounter (Signed)
Spoke with pt this morning and she stated that her blood pressure has come down to 142/76 as of yesterday and she is no longer having the headaches. Pt was advised that when she has a health concern or something that needs to be addressed immediately to not use mychart to please call the office to speak with someone. Pt gave a verbal understanding.

## 2021-05-19 ENCOUNTER — Telehealth: Payer: Self-pay | Admitting: Internal Medicine

## 2021-05-19 NOTE — Telephone Encounter (Signed)
Patient is upset that her insurance has not reduced her fentaNYL (DURAGESIC) 12 MCG/HR. She can not afford the price and would like Dr Derrel Nip to call her [facility].

## 2021-05-20 DIAGNOSIS — G4701 Insomnia due to medical condition: Secondary | ICD-10-CM | POA: Diagnosis not present

## 2021-05-20 DIAGNOSIS — R3581 Nocturnal polyuria: Secondary | ICD-10-CM | POA: Diagnosis not present

## 2021-05-20 DIAGNOSIS — Z87898 Personal history of other specified conditions: Secondary | ICD-10-CM | POA: Diagnosis not present

## 2021-05-20 DIAGNOSIS — R0602 Shortness of breath: Secondary | ICD-10-CM | POA: Diagnosis not present

## 2021-05-26 ENCOUNTER — Telehealth: Payer: Self-pay | Admitting: Internal Medicine

## 2021-05-26 NOTE — Telephone Encounter (Signed)
Left message for patient to call back and schedule Medicare Annual Wellness Visit (AWV) in office.   If not able to come in office, please offer to do virtually or by telephone.   Last AWV:09/22/2016  Please schedule at anytime with Nurse Health Advisor.

## 2021-05-26 NOTE — Telephone Encounter (Signed)
A letter will need to be typed and sent to Sabrina [name] explaining the medical necessity for this so that maybe they will drop the cost of her medication.

## 2021-05-27 NOTE — Telephone Encounter (Signed)
Letter fax has been sent to [facility]

## 2021-05-27 NOTE — Telephone Encounter (Signed)
Send the letter to [facility] ASAP. Patient cannot afford to pay for fentanyl patch out of pocket

## 2021-06-09 ENCOUNTER — Telehealth: Payer: Self-pay | Admitting: Internal Medicine

## 2021-06-09 MED ORDER — TELMISARTAN 80 MG PO TABS: 80.0000 mg | ORAL_TABLET | Freq: Every day | ORAL | 0 refills | Status: DC

## 2021-06-09 NOTE — Telephone Encounter (Addendum)
Reason for Triage Call  Patient reporting BP 201/107  Patient reporting head aches and that the Losartan is not working and wants to switch back to lisinopril. Patient out paying bills now BP at 1 pm today 201/107 pulse 85 . Patient took BP while on phone 191/87 ask patient how much hydralazine 50 mg she had taken she stated only one that she took around lunch she stated  she was advised to take 50 mg if BP was over 150/90 or higher patient taking second dose upon taking last BP at 3:20 PM .  Symptoms:Moderate  Pain Scale: location, Headaches, and level of pain (1-10, 10 severe), 9  Vitals: BP ____201/107____ , Pulse ______85____,  02 Sats  _________, Respirations _________, Temp ___________,   Onset: last two days patient has had headache checked BP today .   Duration:  Medications: Hydralazine 50 mg at lunch  , Losartan 100 mg this morning and Labetalol 200 mg this am. Secpnd dose Hydralazine 50 mg at 3:20 PM. ( For BP 191/87)  Last seen for this problem: 04/24/2021    Attach Access Nurse Triage Note.  ;20 PM.

## 2021-06-09 NOTE — Telephone Encounter (Signed)
Patient informed, Due to the high volume of calls and your symptoms we have to forward your call to our Triage Nurse to expedient your call. Please hold for the transfer.  Patient transferred to Access Nurse. Due to her blood pressure being 207/107 and she has had a headache for the past month.She wants to stop taking losartan and go back to lisinopril.No openings available in office or virtual.Patient disconnected the call,Robert at Access Nurse created a chart for the patient and sent it over to the nurse to contact the patient.

## 2021-06-09 NOTE — Telephone Encounter (Signed)
Spoke with patient insurance will cover telmisartan 80 mg patient willing to try so  telmisartan 80 mg sent in as ordered and DC lisinopril.

## 2021-06-09 NOTE — Telephone Encounter (Signed)
Carrie from Access Nurse called stating that the patient refuses to go to urgent care or the ER and wants for her medicine to be switched to lisinopril.

## 2021-06-10 ENCOUNTER — Telehealth: Payer: Self-pay | Admitting: Internal Medicine

## 2021-06-10 DIAGNOSIS — I10 Essential (primary) hypertension: Secondary | ICD-10-CM

## 2021-06-10 NOTE — Telephone Encounter (Addendum)
Instructed patient of the below.Left arm patient was 114/70 and her right was 171/88 @ 1306. Patient usually checks BP normally on left arm. Patient stated can she take another dose tonight with her evening medication since she has already taken her medication three times today.

## 2021-06-10 NOTE — Telephone Encounter (Signed)
I have not received the PA for this

## 2021-06-10 NOTE — Telephone Encounter (Signed)
Patient has taken Hydralazine '50mg'$  at 0400, 900, and at 1306. Please advise.

## 2021-06-10 NOTE — Telephone Encounter (Signed)
PT called to advise she needs a PA sent in for her telmisartan (MICARDIS) 80 MG tablet to get it refill at the [facility].

## 2021-06-10 NOTE — Telephone Encounter (Signed)
Per access nurse patient refused triage and was given home care advise. Patient stated she just wanted her medication, medication is currently in processing Prior auth.

## 2021-06-10 NOTE — Telephone Encounter (Signed)
Patient informed, Due to the high volume of calls and your symptoms we have to forward your call to our Triage Nurse to expedient your call. Please hold for the transfer.  Patient transferred to Access Nurse. Due to having a BP rating of 207/103. PT wanted to get a PA for her telmisartan (MICARDIS) 80 MG tablet sent in to her Rx to get it refill as well. Advise no apts available to access nurse and transfer over.

## 2021-06-12 ENCOUNTER — Emergency Department: Payer: Medicare Other

## 2021-06-12 ENCOUNTER — Other Ambulatory Visit: Payer: Self-pay

## 2021-06-12 ENCOUNTER — Emergency Department
Admission: EM | Admit: 2021-06-12 | Discharge: 2021-06-13 | Disposition: A | Discharge status: Home / Self Care | Payer: Medicare Other | Attending: Emergency Medicine | Admitting: Emergency Medicine

## 2021-06-12 DIAGNOSIS — Z20822 Contact with and (suspected) exposure to covid-19: Secondary | ICD-10-CM | POA: Diagnosis not present

## 2021-06-12 DIAGNOSIS — E039 Hypothyroidism, unspecified: Secondary | ICD-10-CM | POA: Diagnosis not present

## 2021-06-12 DIAGNOSIS — D631 Anemia in chronic kidney disease: Secondary | ICD-10-CM | POA: Insufficient documentation

## 2021-06-12 DIAGNOSIS — Z7902 Long term (current) use of antithrombotics/antiplatelets: Secondary | ICD-10-CM | POA: Diagnosis not present

## 2021-06-12 DIAGNOSIS — Z87891 Personal history of nicotine dependence: Secondary | ICD-10-CM | POA: Diagnosis not present

## 2021-06-12 DIAGNOSIS — I129 Hypertensive chronic kidney disease with stage 1 through stage 4 chronic kidney disease, or unspecified chronic kidney disease: Secondary | ICD-10-CM | POA: Insufficient documentation

## 2021-06-12 DIAGNOSIS — R059 Cough, unspecified: Secondary | ICD-10-CM | POA: Diagnosis not present

## 2021-06-12 DIAGNOSIS — Z96643 Presence of artificial hip joint, bilateral: Secondary | ICD-10-CM | POA: Diagnosis not present

## 2021-06-12 DIAGNOSIS — Z951 Presence of aortocoronary bypass graft: Secondary | ICD-10-CM | POA: Diagnosis not present

## 2021-06-12 DIAGNOSIS — I10 Essential (primary) hypertension: Secondary | ICD-10-CM | POA: Diagnosis not present

## 2021-06-12 DIAGNOSIS — Z89421 Acquired absence of other right toe(s): Secondary | ICD-10-CM | POA: Diagnosis not present

## 2021-06-12 DIAGNOSIS — Z79899 Other long term (current) drug therapy: Secondary | ICD-10-CM | POA: Insufficient documentation

## 2021-06-12 DIAGNOSIS — N183 Chronic kidney disease, stage 3 unspecified: Secondary | ICD-10-CM | POA: Insufficient documentation

## 2021-06-12 DIAGNOSIS — Z8616 Personal history of COVID-19: Secondary | ICD-10-CM | POA: Diagnosis not present

## 2021-06-12 DIAGNOSIS — R519 Headache, unspecified: Secondary | ICD-10-CM | POA: Diagnosis not present

## 2021-06-12 DIAGNOSIS — I2510 Atherosclerotic heart disease of native coronary artery without angina pectoris: Secondary | ICD-10-CM | POA: Diagnosis not present

## 2021-06-12 DIAGNOSIS — I517 Cardiomegaly: Secondary | ICD-10-CM | POA: Diagnosis not present

## 2021-06-12 MED ORDER — DIPHENHYDRAMINE HCL 50 MG/ML IJ SOLN
12.5000 mg | Freq: Once | INTRAMUSCULAR | Status: AC
  Administered 2021-06-13: 12.5 mg via INTRAVENOUS
  Filled 2021-06-12: qty 1

## 2021-06-12 MED ORDER — PROCHLORPERAZINE EDISYLATE 10 MG/2ML IJ SOLN
2.5000 mg | Freq: Once | INTRAMUSCULAR | Status: AC
  Administered 2021-06-13: 2.5 mg via INTRAVENOUS
  Filled 2021-06-12: qty 2

## 2021-06-12 MED ORDER — VALSARTAN 320 MG PO TABS: 320.0000 mg | ORAL_TABLET | Freq: Every day | ORAL | 0 refills | Status: DC

## 2021-06-12 MED ORDER — ACETAMINOPHEN 500 MG PO TABS
1000.0000 mg | ORAL_TABLET | Freq: Once | ORAL | Status: AC
  Administered 2021-06-12: 1000 mg via ORAL
  Filled 2021-06-12: qty 2

## 2021-06-12 NOTE — Telephone Encounter (Signed)
Patient has been notified, lab and follow up appointment has been scheduled.

## 2021-06-12 NOTE — ED Notes (Signed)
Attempted IV insertion. Pt. States she is deathly afraid of needles and withdrew her arm and screamed when needle was inserted. Will let pt. Relax a bit, and reattempt IV insertion.

## 2021-06-12 NOTE — ED Notes (Signed)
MD notified of pt's BP. 

## 2021-06-12 NOTE — Telephone Encounter (Signed)
Pt states that her blood pressure is 182/102 at 7:45 am today. The telmisartan (MICARDIS) 80 MG tablet got denied and she needs to know what else she can take.

## 2021-06-12 NOTE — ED Provider Notes (Signed)
[facility] Emergency Department Provider Note   ____________________________________________   Event Date/Time   First MD Initiated Contact with Patient 06/12/21 2308     (approximate)  I have reviewed the triage vital signs and the nursing notes.   HISTORY  Chief Complaint Hypertension    HPI Sabrina [name] is a 80 y.o. female who presents to the ED from home with a chief complaint of headache and hypertension since Monday.  Patient takes labetalol, Imdur, losartan.  Headache since Monday, global, not associated with vision changes, nausea/vomiting or dizziness.  PCP change losartan to valsartan which patient is supposed to start in the morning.  Patient states she does not have a baseline blood pressure as it is normally labile.  Denies slurred speech, facial droop, confusion, extremity weakness/numbness/tingling.  Denies fever, chest pain, shortness of breath, abdominal pain, dysuria or diarrhea.  Endorses dry cough.  Patient is vaccinated against COVID-19.      Past Medical History:  Diagnosis Date   Amputation of fifth toe, right, traumatic, subsequent encounter ([place]) 06/18/2019   Anemia of chronic kidney failure    Anxiety    CAD (coronary artery disease)    Carotid artery stenosis    Chronic kidney disease, stage III (moderate) (HCC)    Followed by Dr. 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[place])    Spinal stenosis of lumbar region at multiple levels    Subclavian arterial stenosis [facility])     Patient Active Problem List   Diagnosis Date Noted   Lichenified rash 38/07/1750   Insomnia due to anxiety and fear 01/29/2021   Obesity (BMI 30.0-34.9) 11/25/2020   Fatigue 11/25/2020   Acquired hypothyroidism 07/30/2020   Fatigue due to depression 07/26/2020   Encounter for preventive health examination 05/06/2020   Chronic pain syndrome 03/27/2020   Lumbar facet arthropathy  03/27/2020   Pain due to onychomycosis of toenails of both feet 06/18/2019   Callus 06/18/2019   Ankle swelling 04/22/2019   Educated about COVID-19 virus infection 03/21/2019   Anemia of chronic kidney failure, stage 3 (moderate) ([place]) 02/22/2018   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[place]) 08/04/2017   Aortic aneurysm, abdominal ([place]) 07/05/2017   Chronic breast pain 12/14/2016   Atherosclerosis of renal artery ([place]) 09/09/2016   Carotid stenosis 09/09/2016   Palpitations 06/01/2016   Dizziness 12/04/2015   Cervical spondylosis without myelopathy 08/20/2015   Chronic daily headache 07/20/2015   Statin intolerance 05/20/2015   Chronic radicular lumbar pain 12/24/2014   Lumbar stenosis with neurogenic claudication 12/24/2014   GERD (gastroesophageal reflux disease) 12/06/2014   Chronic epigastric pain 11/14/2014   Chronic right hip pain 11/14/2014   Generalized anxiety disorder 09/08/2014   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, 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[place]) 08/03/2014   Peripheral vascular disease ([place]) 08/03/2014   CAD (coronary artery disease) 08/03/2014   HLD (hyperlipidemia) 06/24/2014   Chronic narcotic use 06/24/2014   Need for prophylactic vaccination and inoculation against influenza 06/24/2014   Osteoarthritis of right hip 04/04/2014   Need for vaccination with 13-polyvalent pneumococcal conjugate vaccine 12/28/2013   Aortic valve disorder 12/28/2013   Hip pain, bilateral 12/27/2013   Essential hypertension 12/27/2013    Past Surgical History:  Procedure Laterality Date   ABDOMINAL HYSTERECTOMY  1976   CAROTID ARTERY ANGIOPLASTY Left    CAROTID ENDARTERECTOMY Left    CHOLECYSTECTOMY     COLONOSCOPY WITH PROPOFOL  N/A 08/16/2017   Procedure: COLONOSCOPY WITH PROPOFOL;  Surgeon: [name], MD;  Location: [facility] ENDOSCOPY;  Service: Endoscopy;  Laterality: N/A;   CORONARY ANGIOPLASTY WITH STENT PLACEMENT  2000   CORONARY ARTERY BYPASS GRAFT  2000    CYSTOSCOPY WITH STENT PLACEMENT Bilateral    ESOPHAGOGASTRODUODENOSCOPY (EGD) WITH PROPOFOL N/A 08/16/2017   Procedure: ESOPHAGOGASTRODUODENOSCOPY (EGD) WITH PROPOFOL;  Surgeon: [name], MD;  Location: [facility] ENDOSCOPY;  Service: Endoscopy;  Laterality: N/A;   ESOPHAGOGASTRODUODENOSCOPY (EGD) WITH PROPOFOL N/A 06/29/2018   Procedure: ESOPHAGOGASTRODUODENOSCOPY (EGD) WITH PROPOFOL;  Surgeon: Virgel Manifold, MD;  Location: [facility] ENDOSCOPY;  Service: Endoscopy;  Laterality: N/A;   JOINT REPLACEMENT Bilateral 2000   RENAL ARTERY ANGIOPLASTY Bilateral mARCH 2015   TOE AMPUTATION Right    small toe   TONSILLECTOMY AND ADENOIDECTOMY     TOTAL HIP ARTHROPLASTY Left    TOTAL HIP ARTHROPLASTY Right 2015    Prior to Admission medications   Medication Sig Start Date End Date Taking? Authorizing Provider  aspirin 81 MG tablet Take 81 mg by mouth daily.    [provider]  chlordiazePOXIDE (LIBRIUM) 25 MG capsule TAKE 1 CAPSULE BY MOUTH ONCE DAILY AS NEEDED FOR ANXIETY 06/12/20   Crecencio Mc, MD  clopidogrel (PLAVIX) 75 MG tablet Take 1 tablet (75 mg total) by mouth daily. To prevent strokes 04/24/21   Crecencio Mc, MD  fentaNYL (DURAGESIC) 12 MCG/HR Place 1 patch onto the skin every 3 (three) days. 04/24/21   Crecencio Mc, MD  fentaNYL (DURAGESIC) 25 MCG/HR Place 1 patch onto the skin every 3 (three) days. 04/10/21   Crecencio Mc, MD  hydrALAZINE (APRESOLINE) 50 MG tablet TAKE 1 TABLET BY MOUTH THREE TIMES DAILY AS NEEDED FOR BLOOD PRESSURE GREATER THAN 150 09/15/20   Crecencio Mc, MD  isosorbide mononitrate (IMDUR) 30 MG 24 hr tablet Take by mouth. 04/22/21 04/22/22  [provider]  labetalol (NORMODYNE) 200 MG tablet Take 1 tablet (200 mg total) by mouth 2 (two) times daily. For hypertension 04/24/21   Crecencio Mc, MD  [facility] 4 MG/0.1ML LIQD nasal spray kit  12/12/19   [provider]  nortriptyline (PAMELOR) 10 MG capsule Take 1 capsule (10 mg total) by  mouth 2 (two) times daily. For chronic headaches 04/24/21   Crecencio Mc, MD  Omega 3 1000 MG CAPS Take 1 capsule by mouth 2 (two) times daily.     [provider]  ondansetron (ZOFRAN ODT) 4 MG disintegrating tablet Take 1 tablet (4 mg total) by mouth every 8 (eight) hours as needed for nausea or vomiting. 12/30/20   Crecencio Mc, MD  pantoprazole (PROTONIX) 40 MG tablet Take 1 tablet by mouth once daily 12/17/20   Crecencio Mc, MD  thyroid [facility] THYROID) 30 MG tablet Take 1 tablet (30 mg total) by mouth daily. For underactive thyroid 04/27/21   Crecencio Mc, MD  valsartan (DIOVAN) 320 MG tablet Take 1 tablet (320 mg total) by mouth daily. 06/12/21   Leone Haven, MD  ZETIA 10 MG tablet Take 1 tablet (10 mg total) by mouth daily. For high cholesterol 04/24/21   Crecencio Mc, MD    Allergies Citalopram, Dilaudid [hydromorphone hcl], Hydrochlorothiazide, Nsaids, Nubain [nalbuphine hcl], Penicillins, Prasugrel, and Statins  Family History  Problem Relation Age of Onset   Stroke Mother    Hypertension Mother    Diabetes Mother    Hypertension Father    Heart disease Sister  MI   Multiple sclerosis Daughter    Multiple sclerosis Son    Cerebral aneurysm Son    Seizures Son    Cerebral aneurysm Son    Breast cancer Paternal Aunt 80    Social History Social History   Tobacco Use   Smoking status: Former    Types: Cigarettes    Quit date: 10/25/1998    Years since quitting: 22.6   Smokeless tobacco: Never  Vaping Use   Vaping Use: Never used  Substance Use Topics   Alcohol use: No    Alcohol/week: 0.0 standard drinks   Drug use: Never    Review of Systems  Constitutional: No fever/chills Eyes: No visual changes. ENT: No sore throat. Cardiovascular: Denies chest pain. Respiratory: Positive for cough.  Denies shortness of breath. Gastrointestinal: No abdominal pain.  No nausea, no vomiting.  No diarrhea.  No constipation. Genitourinary: Negative  for dysuria. Musculoskeletal: Negative for back pain. Skin: Negative for rash. Neurological: Positive for headache. Negative for focal weakness or numbness.   ____________________________________________   PHYSICAL EXAM:  VITAL SIGNS: ED Triage Vitals  Enc Vitals Group     BP 06/12/21 2142 (!) 177/90     Pulse Rate 06/12/21 2142 96     Resp 06/12/21 2142 18     Temp 06/12/21 2142 98.8 F (37.1 C)     Temp Source 06/12/21 2142 Oral     SpO2 06/12/21 2142 99 %     Weight 06/12/21 2144 167 lb 15.9 oz (76.2 kg)     Height 06/12/21 2144 5' (1.524 m)     Head Circumference --      Peak Flow --      Pain Score 06/12/21 2144 10     Pain Loc --      Pain Edu? --      Excl. in [place]? --     Constitutional: Alert and oriented. Well appearing and in no acute distress. Eyes: Conjunctivae are normal. PERRL. EOMI. Head: Atraumatic. Nose: No congestion/rhinnorhea. Mouth/Throat: Mucous membranes are moist.   Neck: No stridor.  Supple neck without meningismus.  No carotid bruits. Cardiovascular: Normal rate, regular rhythm. Grossly normal heart sounds.  Good peripheral circulation. Respiratory: Normal respiratory effort.  No retractions. Lungs CTAB. Gastrointestinal: Soft and nontender to light or deep palpation. No distention. No abdominal bruits. No CVA tenderness. Musculoskeletal: No lower extremity tenderness nor edema.  No joint effusions. Neurologic: Alert and oriented x3.  CN II to XII grossly intact.  Normal speech and language. No gross focal neurologic deficits are appreciated. No gait instability. Skin:  Skin is warm, dry and intact. No rash noted. Psychiatric: Mood and affect are normal. Speech and behavior are normal.  ____________________________________________   LABS (all labs ordered are listed, but only abnormal results are displayed)  Labs Reviewed  COMPREHENSIVE METABOLIC PANEL - Abnormal; Notable for the following components:      Result Value   Potassium 3.3 (*)     Glucose, Bld 107 (*)    Creatinine, Ser 1.24 (*)    Total Protein 8.2 (*)    GFR, Estimated 44 (*)    All other components within normal limits  URINALYSIS, COMPLETE (UACMP) WITH MICROSCOPIC - Abnormal; Notable for the following components:   Color, Urine STRAW (*)    APPearance CLEAR (*)    Specific Gravity, Urine 1.004 (*)    Hgb urine dipstick SMALL (*)    Protein, ur 30 (*)    All other components within normal  limits  RESP PANEL BY RT-PCR (FLU A&B, COVID) ARPGX2  CBC WITH DIFFERENTIAL/PLATELET  TROPONIN I (HIGH SENSITIVITY)  TROPONIN I (HIGH SENSITIVITY)   ____________________________________________  EKG  ED ECG REPORT I, Jarrin Staley J, the attending physician, personally viewed and interpreted this ECG.   Date: 06/12/2021  EKG Time: 0030  Rate: 75  Rhythm: normal sinus rhythm  Axis: Normal  Intervals:none  ST&T Change: Nonspecific  ____________________________________________  RADIOLOGY I, Salome Cozby J, personally viewed and evaluated these images (plain radiographs) as part of my medical decision making, as well as reviewing the written report by the radiologist.  ED MD interpretation: No ICH; mild pulmonary vascular congestion  Official radiology report(s): CT HEAD WO CONTRAST (5MM)  Result Date: 06/12/2021 CLINICAL DATA:  Headache EXAM: CT HEAD WITHOUT CONTRAST TECHNIQUE: Contiguous axial images were obtained from the base of the skull through the vertex without intravenous contrast. COMPARISON:  CT brain 06/04/2018 FINDINGS: Brain: No acute territorial infarction, hemorrhage, or intracranial mass. Mild atrophy. Stable ventricle size. Stable extra-axial CSF along the left inferior frontal lobe. Vascular: No hyperdense vessels.  Carotid vascular calcification Skull: Normal. Negative for fracture or focal lesion. Sinuses/Orbits: No acute finding. Other: None IMPRESSION: No CT evidence for acute intracranial abnormality. Electronically Signed   By: Donavan Foil M.D.    On: 06/12/2021 22:10   DG Chest Port 1 View  Result Date: 06/13/2021 CLINICAL DATA:  Cough, headache, and hypertension since Monday. EXAM: PORTABLE CHEST 1 VIEW COMPARISON:  11/26/2015 FINDINGS: Postoperative changes in the mediastinum. Mild cardiac enlargement. Central vascular congestion. No airspace disease, consolidation, or effusions. No pneumothorax. Calcified and tortuous aorta. Degenerative changes in the spine and shoulders. Mediastinal contours appear intact. IMPRESSION: Cardiac enlargement with mild pulmonary vascularity. No edema or consolidation. Electronically Signed   By: Lucienne Capers M.D.   On: 06/13/2021 00:11    ____________________________________________   PROCEDURES  Procedure(s) performed (including Critical Care):  .1-3 Lead EKG Interpretation  Date/Time: 06/12/2021 11:44 PM Performed by: Paulette Blanch, MD Authorized by: Paulette Blanch, MD     Interpretation: normal     ECG rate:  80   ECG rate assessment: normal     Rhythm: sinus rhythm     Ectopy: none     Conduction: normal   Comments:     Placed on cardiac monitor to evaluate for arrhythmias   ____________________________________________   INITIAL IMPRESSION / ASSESSMENT AND PLAN / ED COURSE  As part of my medical decision making, I reviewed the following data within the [facility] notes reviewed and incorporated, Labs reviewed, EKG interpreted, Old chart reviewed, Radiograph reviewed, and Notes from prior ED visits     80 year old female presenting with headache. Differential diagnosis includes, but is not limited to, intracranial hemorrhage, meningitis/encephalitis, previous head trauma, cavernous venous thrombosis, tension headache, temporal arteritis, migraine or migraine equivalent, idiopathic intracranial hypertension, and non-specific headache.   Headache most likely secondary to elevated blood pressure.  CT head negative for intracranial abnormality.  Will check lab  work, UA, respiratory panel, chest x-ray.  Administer headache cocktail and reassess.  Consider clonidine for blood pressure if it is still elevated after analgesics.  Clinical Course as of 06/13/21 0517  Sat Jun 13, 2021  4098 IV team consult placed for patient being difficult stick. [JS]  1191 Patient feeling better.  Blood pressure improved.  Patient desires discharge home.  She will follow-up closely with her PCP.  She will start valsartan in the morning as directed by  her PCP.  Strict return precautions given.  Patient verbalizes understanding and agrees with plan of care. [JS]    Clinical Course User Index [JS] Paulette Blanch, MD     ____________________________________________   FINAL CLINICAL IMPRESSION(S) / ED DIAGNOSES  Final diagnoses:  Hypertension, unspecified type     ED Discharge Orders     None        Note:  This document was prepared using Dragon voice recognition software and may include unintentional dictation errors.    Paulette Blanch, MD 06/13/21 [phone]

## 2021-06-12 NOTE — Telephone Encounter (Addendum)
I sent valsartan in to the pharmacy for her to start on in place of the losartan. She should not take the losartan or the lisinopril while on the valsartan. She needs an appointment to discuss this issue further. If she develops any of the symptoms you listed in your prior note she needs to be evaluated over the weekend. She will also need a BMET 1-2 weeks after starting on the valsartan. Order placed.

## 2021-06-12 NOTE — ED Triage Notes (Signed)
Pt c/o headache and htn since mon (reading at home 177/115), reports been taking labetolol/losartan as prescribed. PMD switched to valsartan '320mg'$  for in the morning

## 2021-06-12 NOTE — Telephone Encounter (Signed)
Patient feels fine and doesn't have any sx of SOB, heart palpitations, blurry vision headaches, arm px, jaw px, and chest px.  Since her rx PA git denied by insurance she would like to know what to take next due to her BP being elevated. She wants to know can she take more lisinopril then?

## 2021-06-12 NOTE — Addendum Note (Signed)
Addended by: Caryl Bis Aarini Slee G on: 06/12/2021 12:43 PM   Modules accepted: Orders

## 2021-06-12 NOTE — ED Notes (Signed)
ED Provider at bedside. 

## 2021-06-12 NOTE — ED Notes (Signed)
Pt. Placed on cont. Cardiac monitoring.  

## 2021-06-13 DIAGNOSIS — I129 Hypertensive chronic kidney disease with stage 1 through stage 4 chronic kidney disease, or unspecified chronic kidney disease: Secondary | ICD-10-CM | POA: Diagnosis not present

## 2021-06-13 DIAGNOSIS — R519 Headache, unspecified: Secondary | ICD-10-CM | POA: Diagnosis not present

## 2021-06-13 DIAGNOSIS — I517 Cardiomegaly: Secondary | ICD-10-CM | POA: Diagnosis not present

## 2021-06-13 DIAGNOSIS — I10 Essential (primary) hypertension: Secondary | ICD-10-CM | POA: Diagnosis not present

## 2021-06-13 DIAGNOSIS — R059 Cough, unspecified: Secondary | ICD-10-CM | POA: Diagnosis not present

## 2021-06-13 LAB — RESP PANEL BY RT-PCR (FLU A&B, COVID) ARPGX2
Influenza A by PCR: NEGATIVE
Influenza B by PCR: NEGATIVE
SARS Coronavirus 2 by RT PCR: NEGATIVE

## 2021-06-13 LAB — COMPREHENSIVE METABOLIC PANEL
ALT: 16 U/L (ref 0–44)
AST: 21 U/L (ref 15–41)
Albumin: 4.2 g/dL (ref 3.5–5.0)
Alkaline Phosphatase: 70 U/L (ref 38–126)
Anion gap: 7 (ref 5–15)
BUN: 13 mg/dL (ref 8–23)
CO2: 28 mmol/L (ref 22–32)
Calcium: 9.5 mg/dL (ref 8.9–10.3)
Chloride: 102 mmol/L (ref 98–111)
Creatinine, Ser: 1.24 mg/dL — ABNORMAL HIGH (ref 0.44–1.00)
GFR, Estimated: 44 mL/min — ABNORMAL LOW (ref >60)
Glucose, Bld: 107 mg/dL — ABNORMAL HIGH (ref 70–99)
Potassium: 3.3 mmol/L — ABNORMAL LOW (ref 3.5–5.1)
Sodium: 137 mmol/L (ref 135–145)
Total Bilirubin: 0.6 mg/dL (ref 0.3–1.2)
Total Protein: 8.2 g/dL — ABNORMAL HIGH (ref 6.5–8.1)

## 2021-06-13 LAB — CBC WITH DIFFERENTIAL/PLATELET
Abs Immature Granulocytes: 0.01 10*3/uL (ref 0.00–0.07)
Basophils Absolute: 0 10*3/uL (ref 0.0–0.1)
Basophils Relative: 1 %
Eosinophils Absolute: 0.4 10*3/uL (ref 0.0–0.5)
Eosinophils Relative: 7 %
HCT: 38.9 % (ref 36.0–46.0)
Hemoglobin: 13.2 g/dL (ref 12.0–15.0)
Immature Granulocytes: 0 %
Lymphocytes Relative: 34 %
Lymphs Abs: 2.1 10*3/uL (ref 0.7–4.0)
MCH: 29.3 pg (ref 26.0–34.0)
MCHC: 33.9 g/dL (ref 30.0–36.0)
MCV: 86.4 fL (ref 80.0–100.0)
Monocytes Absolute: 0.6 10*3/uL (ref 0.1–1.0)
Monocytes Relative: 9 %
Neutro Abs: 3 10*3/uL (ref 1.7–7.7)
Neutrophils Relative %: 49 %
Platelets: 264 10*3/uL (ref 150–400)
RBC: 4.5 MIL/uL (ref 3.87–5.11)
RDW: 13.2 % (ref 11.5–15.5)
WBC: 6.2 10*3/uL (ref 4.0–10.5)
nRBC: 0 % (ref 0.0–0.2)

## 2021-06-13 LAB — URINALYSIS, COMPLETE (UACMP) WITH MICROSCOPIC
Bacteria, UA: NONE SEEN
Bilirubin Urine: NEGATIVE
Glucose, UA: NEGATIVE mg/dL
Ketones, ur: NEGATIVE mg/dL
Leukocytes,Ua: NEGATIVE
Nitrite: NEGATIVE
Protein, ur: 30 mg/dL — AB
Specific Gravity, Urine: 1.004 — ABNORMAL LOW (ref 1.005–1.030)
pH: 6 (ref 5.0–8.0)

## 2021-06-13 LAB — TROPONIN I (HIGH SENSITIVITY): Troponin I (High Sensitivity): 8 ng/L (ref <18)

## 2021-06-13 NOTE — ED Notes (Signed)
MD notified, pt. Wants to go home.

## 2021-06-13 NOTE — Discharge Instructions (Addendum)
Start Valsartan in the morning as directed by your doctor.  Return to the ER for worsening symptoms, persistent vomiting, difficulty breathing or other concerns

## 2021-06-22 ENCOUNTER — Other Ambulatory Visit (INDEPENDENT_AMBULATORY_CARE_PROVIDER_SITE_OTHER): Payer: Medicare Other

## 2021-06-22 ENCOUNTER — Other Ambulatory Visit: Payer: Self-pay

## 2021-06-22 DIAGNOSIS — E039 Hypothyroidism, unspecified: Secondary | ICD-10-CM | POA: Diagnosis not present

## 2021-06-22 DIAGNOSIS — I10 Essential (primary) hypertension: Secondary | ICD-10-CM

## 2021-06-23 ENCOUNTER — Telehealth: Payer: Self-pay

## 2021-06-23 ENCOUNTER — Other Ambulatory Visit: Payer: Self-pay | Admitting: Internal Medicine

## 2021-06-23 DIAGNOSIS — E039 Hypothyroidism, unspecified: Secondary | ICD-10-CM

## 2021-06-23 LAB — BASIC METABOLIC PANEL
BUN: 17 mg/dL (ref 6–23)
CO2: 25 mEq/L (ref 19–32)
Calcium: 9.8 mg/dL (ref 8.4–10.5)
Chloride: 101 mEq/L (ref 96–112)
Creatinine, Ser: 1.29 mg/dL — ABNORMAL HIGH (ref 0.40–1.20)
GFR: 39.52 mL/min — ABNORMAL LOW (ref >60.00)
Glucose, Bld: 115 mg/dL — ABNORMAL HIGH (ref 70–99)
Potassium: 4.2 mEq/L (ref 3.5–5.1)
Sodium: 136 mEq/L (ref 135–145)

## 2021-06-23 LAB — TSH: TSH: 7.67 u[IU]/mL — ABNORMAL HIGH (ref 0.35–5.50)

## 2021-06-23 MED ORDER — PANTOPRAZOLE SODIUM 40 MG PO TBEC: 40.0000 mg | DELAYED_RELEASE_TABLET | Freq: Every day | ORAL | 5 refills | Status: DC

## 2021-06-23 MED ORDER — THYROID 30 MG PO TABS: 30.0000 mg | ORAL_TABLET | Freq: Every day | ORAL | 0 refills | Status: DC

## 2021-06-23 MED ORDER — NORTRIPTYLINE HCL 10 MG PO CAPS: 10.0000 mg | ORAL_CAPSULE | Freq: Two times a day (BID) | ORAL | 1 refills | Status: DC

## 2021-06-23 NOTE — Telephone Encounter (Signed)
Refills sent

## 2021-06-23 NOTE — Telephone Encounter (Signed)
Pt needs refill on nortriptyline (PAMELOR) 10 MG capsule, pantoprazole (PROTONIX) 40 MG tablet, thyroid (ARMOUR THYROID) 30 MG tablet

## 2021-06-24 ENCOUNTER — Other Ambulatory Visit: Payer: Self-pay | Admitting: Internal Medicine

## 2021-06-24 DIAGNOSIS — E039 Hypothyroidism, unspecified: Secondary | ICD-10-CM

## 2021-06-24 MED ORDER — THYROID 30 MG PO TABS: 60.0000 mg | ORAL_TABLET | Freq: Every day | ORAL | 0 refills | Status: DC

## 2021-06-25 ENCOUNTER — Telehealth: Payer: Self-pay | Admitting: Internal Medicine

## 2021-06-25 MED ORDER — LIOTHYRONINE SODIUM 5 MCG PO TABS: 5.0000 ug | ORAL_TABLET | Freq: Every day | ORAL | 1 refills | Status: DC

## 2021-06-25 NOTE — Progress Notes (Signed)
Patient states that she is now intolerant of Armour and has not been taking it.  She has past intolerances to levothyroxine as well   Sending low dose cytomel 5 mcg daily to pharmacy  Endocrinology consult recommended ,  will she go?

## 2021-06-25 NOTE — Telephone Encounter (Signed)
Patient states that she is now intolerant of Armour and has not been taking it.  She has past intolerances to levothyroxine as well .  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  SHE should increase her labetalol to 400 mg twice daily for her blood pressure    Sending low dose cytomel 5 mcg daily to pharmacy   Endocrinology consult recommended ,  will she go?

## 2021-06-25 NOTE — Telephone Encounter (Signed)
See other telephone encounter. Pt is aware and picking up the cytomel.

## 2021-06-25 NOTE — Addendum Note (Signed)
Addended by: Crecencio Mc on: 06/25/2021 02:38 PM   Modules accepted: Orders

## 2021-07-01 ENCOUNTER — Other Ambulatory Visit: Payer: Self-pay | Admitting: Internal Medicine

## 2021-07-06 ENCOUNTER — Other Ambulatory Visit: Payer: Self-pay

## 2021-07-06 ENCOUNTER — Other Ambulatory Visit: Payer: Self-pay | Admitting: Internal Medicine

## 2021-07-06 ENCOUNTER — Ambulatory Visit (INDEPENDENT_AMBULATORY_CARE_PROVIDER_SITE_OTHER): Payer: Medicare Other | Admitting: Internal Medicine

## 2021-07-06 ENCOUNTER — Telehealth: Payer: Self-pay | Admitting: Internal Medicine

## 2021-07-06 ENCOUNTER — Encounter: Payer: Self-pay | Admitting: Internal Medicine

## 2021-07-06 VITALS — BP 162/84 | HR 73 | Temp 96.1°F | Ht 60.0 in | Wt 168.6 lb

## 2021-07-06 DIAGNOSIS — D631 Anemia in chronic kidney disease: Secondary | ICD-10-CM

## 2021-07-06 DIAGNOSIS — R053 Chronic cough: Secondary | ICD-10-CM | POA: Diagnosis not present

## 2021-07-06 DIAGNOSIS — I701 Atherosclerosis of renal artery: Secondary | ICD-10-CM | POA: Diagnosis not present

## 2021-07-06 DIAGNOSIS — M5416 Radiculopathy, lumbar region: Secondary | ICD-10-CM | POA: Diagnosis not present

## 2021-07-06 DIAGNOSIS — G8929 Other chronic pain: Secondary | ICD-10-CM

## 2021-07-06 DIAGNOSIS — G4700 Insomnia, unspecified: Secondary | ICD-10-CM | POA: Diagnosis not present

## 2021-07-06 DIAGNOSIS — E039 Hypothyroidism, unspecified: Secondary | ICD-10-CM

## 2021-07-06 DIAGNOSIS — R5383 Other fatigue: Secondary | ICD-10-CM

## 2021-07-06 DIAGNOSIS — N183 Chronic kidney disease, stage 3 unspecified: Secondary | ICD-10-CM

## 2021-07-06 DIAGNOSIS — I10 Essential (primary) hypertension: Secondary | ICD-10-CM

## 2021-07-06 DIAGNOSIS — Z23 Encounter for immunization: Secondary | ICD-10-CM

## 2021-07-06 DIAGNOSIS — R0609 Other forms of dyspnea: Secondary | ICD-10-CM | POA: Diagnosis not present

## 2021-07-06 NOTE — Assessment & Plan Note (Signed)
Unclear if elevations are due to pain, anxiety or under treatment.  Today I have recommended that she add 50 mg hydralazine q 8 hours to regimen of valsartan 320 mg daily and labetalol 20 mg bid.

## 2021-07-06 NOTE — Assessment & Plan Note (Signed)
Now untreated.  Resume tramadol 100 mg every 12 hours as a trial, she has self weaned from fentanyl over one month ago

## 2021-07-06 NOTE — Assessment & Plan Note (Signed)
hemoglobin HAS normalized  Lab Results  Component Value Date   WBC 6.2 06/13/2021   HGB 13.2 06/13/2021   HCT 38.9 06/13/2021   MCV 86.4 06/13/2021   PLT 264 06/13/2021

## 2021-07-06 NOTE — Assessment & Plan Note (Addendum)
Chronic complaint,  aggravated by chronic pain, inactive lifestyle .  Sleep apnea suspected given history of snoring .  Sleep study was ordered in February but not done for unclear reasons.  Reordered

## 2021-07-06 NOTE — Telephone Encounter (Signed)
Lft a vm at sleep med regarding pt sleep study. Thank you!

## 2021-07-06 NOTE — Telephone Encounter (Signed)
RX Refill: Tramdol Last Seen:07-06-21 Last ordered:12-30-20

## 2021-07-06 NOTE — Progress Notes (Signed)
Subjective:  Patient ID: Sabrina [name], female    DOB: 05-24-1941  Age: 80 y.o. MRN: [id number]  CC: The primary encounter diagnosis was Need for immunization against influenza. Diagnoses of Acquired hypothyroidism, Fatigue, unspecified type, Anemia of chronic kidney failure, stage 3 (moderate) (HCC), Chronic radicular lumbar pain, and Essential hypertension were also pertinent to this visit.  HPI Sabrina [name] presents for  Chief Complaint  Patient presents with   Follow-up    Hypertension, hypothyroidism   HTN:  multiple calls to office over last several weeks due to uncontrolled hypertension and recent  ER visit reviewed Currently taking  valsartan 320 mg daily,   labetolol 200 mg bid,  and checking BP 4 times daily.  last dose of prn hydralazine was sept 10th  at 4:30 am for bp 163/ 80     Bp this morning was 149/75 this am at 4:30 am .  Took labetalol this morning.  Takes valsartan qhs,  states that her BP was 124/68 at Dr Gust Brooms office  30 minutes ago by his RN  Chronic pain:  Has not used transdermal fentanyl  since august 14   no withdrawal symptoms.  But pain is now unmanaged on tylenol alone.  Has tramadol,  has not tried taking it since stopping fentanyl.    Taking generic zetia and generic protonix.  Has a list of problems  1) back pain ,  groin pain ,  hip joints upper legs..  constant 24/7 worse since stopping fentanyl  2) coughs every 3 hours  dry cough ,  fleming handling  3)  needs refills  on plavix and nortiptyline    Outpatient Medications Prior to Visit  Medication Sig Dispense Refill   aspirin 81 MG tablet Take 81 mg by mouth daily.     chlordiazePOXIDE (LIBRIUM) 25 MG capsule TAKE 1 CAPSULE BY MOUTH ONCE DAILY AS NEEDED FOR ANXIETY 30 capsule 5   clopidogrel (PLAVIX) 75 MG tablet Take 1 tablet (75 mg total) by mouth daily. To prevent strokes 90 tablet 1   hydrALAZINE (APRESOLINE) 50 MG tablet TAKE 1 TABLET BY MOUTH THREE TIMES DAILY AS NEEDED  FOR BLOOD PRESSURE GREATER THAN 150 270 tablet 0   isosorbide mononitrate (IMDUR) 30 MG 24 hr tablet Take by mouth.     labetalol (NORMODYNE) 200 MG tablet Take 1 tablet (200 mg total) by mouth 2 (two) times daily. For hypertension 180 tablet 1   liothyronine (CYTOMEL) 5 MCG tablet Take 1 tablet (5 mcg total) by mouth daily. For underactive thyroid 90 tablet 1   NARCAN 4 MG/0.1ML LIQD nasal spray kit      nortriptyline (PAMELOR) 10 MG capsule Take 1 capsule (10 mg total) by mouth 2 (two) times daily. For chronic headaches 180 capsule 1   Omega 3 1000 MG CAPS Take 1 capsule by mouth 2 (two) times daily.      ondansetron (ZOFRAN ODT) 4 MG disintegrating tablet Take 1 tablet (4 mg total) by mouth every 8 (eight) hours as needed for nausea or vomiting. 30 tablet 0   pantoprazole (PROTONIX) 40 MG tablet Take 1 tablet by mouth once daily 90 tablet 0   pantoprazole (PROTONIX) 40 MG tablet Take 1 tablet (40 mg total) by mouth daily. 30 tablet 5   valsartan (DIOVAN) 320 MG tablet Take 1 tablet (320 mg total) by mouth daily. 90 tablet 0   ZETIA 10 MG tablet Take 1 tablet (10 mg total) by mouth daily. For high cholesterol 30  tablet 5   fentaNYL (DURAGESIC) 12 MCG/HR Place 1 patch onto the skin every 3 (three) days. 10 patch 0   fentaNYL (DURAGESIC) 25 MCG/HR Place 1 patch onto the skin every 3 (three) days. 10 patch 0   No facility-administered medications prior to visit.    Review of Systems;  Patient denies headache, fevers, malaise, unintentional weight loss, skin rash, eye pain, sinus congestion and sinus pain, sore throat, dysphagia,  hemoptysis , cough, dyspnea, wheezing, chest pain, palpitations, orthopnea, edema, abdominal pain, nausea, melena, diarrhea, constipation, flank pain, dysuria, hematuria, urinary  Frequency, nocturia, numbness, tingling, seizures,  Focal weakness, Loss of consciousness,  Tremor, insomnia, depression, anxiety, and suicidal ideation.      Objective:  BP (!) 162/84 (BP  Location: Left Arm, Patient Position: Sitting, Cuff Size: Normal)   Pulse 73   Temp (!) 96.1 F (35.6 C) (Temporal)   Ht 5' (1.524 m)   Wt 168 lb 9.6 oz (76.5 kg)   SpO2 99%   BMI 32.93 kg/m   BP Readings from Last 3 Encounters:  07/06/21 (!) 162/84  06/13/21 (!) 179/75  04/24/21 122/84    Wt Readings from Last 3 Encounters:  07/06/21 168 lb 9.6 oz (76.5 kg)  06/12/21 167 lb 15.9 oz (76.2 kg)  04/24/21 168 lb (76.2 kg)    General appearance: alert, cooperative and appears stated age Ears: normal TM's and external ear canals both ears Throat: lips, mucosa, and tongue normal; teeth and gums normal Neck: no adenopathy, no carotid bruit, supple, symmetrical, trachea midline and thyroid not enlarged, symmetric, no tenderness/mass/nodules Back: symmetric, no curvature. ROM normal. No CVA tenderness. Lungs: clear to auscultation bilaterally Heart: regular rate and rhythm, S1, S2 normal, no murmur, click, rub or gallop Abdomen: soft, non-tender; bowel sounds normal; no masses,  no organomegaly Pulses: 2+ and symmetric Skin: Skin color, texture, turgor normal. No rashes or lesions Lymph nodes: Cervical, supraclavicular, and axillary nodes normal.  Lab Results  Component Value Date   HGBA1C 5.9 07/19/2016   HGBA1C 5.8 06/02/2015    Lab Results  Component Value Date   CREATININE 1.29 (H) 06/22/2021   CREATININE 1.24 (H) 06/13/2021   CREATININE 1.24 (H) 09/09/2020    Lab Results  Component Value Date   WBC 6.2 06/13/2021   HGB 13.2 06/13/2021   HCT 38.9 06/13/2021   PLT 264 06/13/2021   GLUCOSE 115 (H) 06/22/2021   CHOL 205 (H) 07/29/2020   TRIG 131.0 07/29/2020   HDL 49.10 07/29/2020   LDLDIRECT 109.0 03/05/2016   LDLCALC 130 (H) 07/29/2020   ALT 16 06/13/2021   AST 21 06/13/2021   NA 136 06/22/2021   K 4.2 06/22/2021   CL 101 06/22/2021   CREATININE 1.29 (H) 06/22/2021   BUN 17 06/22/2021   CO2 25 06/22/2021   TSH 7.67 (H) 06/22/2021   INR 1.0 03/27/2014    HGBA1C 5.9 07/19/2016    CT HEAD WO CONTRAST (5MM)  Result Date: 06/12/2021 CLINICAL DATA:  Headache EXAM: CT HEAD WITHOUT CONTRAST TECHNIQUE: Contiguous axial images were obtained from the base of the skull through the vertex without intravenous contrast. COMPARISON:  CT brain 06/04/2018 FINDINGS: Brain: No acute territorial infarction, hemorrhage, or intracranial mass. Mild atrophy. Stable ventricle size. Stable extra-axial CSF along the left inferior frontal lobe. Vascular: No hyperdense vessels.  Carotid vascular calcification Skull: Normal. Negative for fracture or focal lesion. Sinuses/Orbits: No acute finding. Other: None IMPRESSION: No CT evidence for acute intracranial abnormality. Electronically Signed   By:  Donavan Foil M.D.   On: 06/12/2021 22:10   DG Chest Port 1 View  Result Date: 06/13/2021 CLINICAL DATA:  Cough, headache, and hypertension since Monday. EXAM: PORTABLE CHEST 1 VIEW COMPARISON:  11/26/2015 FINDINGS: Postoperative changes in the mediastinum. Mild cardiac enlargement. Central vascular congestion. No airspace disease, consolidation, or effusions. No pneumothorax. Calcified and tortuous aorta. Degenerative changes in the spine and shoulders. Mediastinal contours appear intact. IMPRESSION: Cardiac enlargement with mild pulmonary vascularity. No edema or consolidation. Electronically Signed   By: Lucienne Capers M.D.   On: 06/13/2021 00:11    Assessment & Plan:   Problem List Items Addressed This Visit       Unprioritized   Essential hypertension    Unclear if elevations are due to pain, anxiety or under treatment.  Today I have recommended that she add 50 mg hydralazine q 8 hours to regimen of valsartan 320 mg daily and labetalol 20 mg bid.       Chronic radicular lumbar pain    Now untreated.  Resume tramadol 100 mg every 12 hours as a trial, she has self weaned from fentanyl over one month ago      Anemia of chronic kidney failure, stage 3 (moderate) (HCC)     hemoglobin HAS normalized  Lab Results  Component Value Date   WBC 6.2 06/13/2021   HGB 13.2 06/13/2021   HCT 38.9 06/13/2021   MCV 86.4 06/13/2021   PLT 264 06/13/2021         Acquired hypothyroidism    Suboptimally treated due to intolerance of levothyroxine and armour thyroid. (Now  Tolerating lithyroninine)    Felt that armour thyroidmed was increasign her BP  .  Lab Results  Component Value Date   TSH 7.67 (H) 06/22/2021        Fatigue    Chronic complaint,  aggravated by chronic pain, inactive lifestyle .  Sleep apnea suspected given history of snoring .  Sleep study was ordered in February but not done for unclear reasons.  Reordered       Other Visit Diagnoses     Need for immunization against influenza    -  Primary   Relevant Orders   Flu Vaccine QUAD High Dose(Fluad) (Completed)       I spent 30 minutes dedicated to the care of this patient on the date of this encounter to include pre-visit review of his medical history,  Face-to-face time with the patient , and post visit ordering of testing and therapeutics.   Medications Discontinued During This Encounter  Medication Reason   fentaNYL (DURAGESIC) 25 MCG/HR    fentaNYL (DURAGESIC) 12 MCG/HR     Follow-up: Return in about 4 weeks (around 08/03/2021).   Crecencio Mc, MD

## 2021-07-06 NOTE — Patient Instructions (Addendum)
Continue valsartan 320 mg once daily  Continue labetalol  200 mg twice daily  Start taking 50 mg hydralazine every 8 hours   for the next week:    5 AM morning 1-  2 pm   and  10 pm    Send me readings in one week :  Check BP 10 am  and 8 pm   NOT MORE FREQUENTLY    START TAKING TRAMADOL 100 MG EVERY 12 HOURS FOR YOUR PAIN   You can continue to take  2000 mg of acetominophen (tylenol) every day safely  In divided doses (500 mg every 6 hours  Or 1000 mg every 12 hours.)

## 2021-07-06 NOTE — Assessment & Plan Note (Signed)
Suboptimally treated due to intolerance of levothyroxine and armour thyroid. (Now  Tolerating lithyroninine)    Felt that armour thyroidmed was increasign her BP  .  Lab Results  Component Value Date   TSH 7.67 (H) 06/22/2021

## 2021-07-07 ENCOUNTER — Other Ambulatory Visit: Payer: Self-pay | Admitting: Internal Medicine

## 2021-07-07 MED ORDER — TRAMADOL HCL 50 MG PO TABS: 100.0000 mg | ORAL_TABLET | Freq: Two times a day (BID) | ORAL | 0 refills | Status: DC

## 2021-07-09 ENCOUNTER — Other Ambulatory Visit: Payer: Self-pay | Admitting: Internal Medicine

## 2021-07-15 ENCOUNTER — Telehealth: Payer: Self-pay | Admitting: Internal Medicine

## 2021-07-15 NOTE — Telephone Encounter (Signed)
Patient is calling in to see if Dr.Tullo received her blood pressure readings she sent over today through [facility].Unable to see the readings in pt encounter,she stated that she is unable to do this again because her machine does not save the readings.Please advise.

## 2021-07-16 ENCOUNTER — Other Ambulatory Visit: Payer: Self-pay | Admitting: Internal Medicine

## 2021-07-16 DIAGNOSIS — I10 Essential (primary) hypertension: Secondary | ICD-10-CM

## 2021-07-16 MED ORDER — LABETALOL HCL 300 MG PO TABS: 300.0000 mg | ORAL_TABLET | Freq: Two times a day (BID) | ORAL | 0 refills | Status: DC

## 2021-07-16 NOTE — Progress Notes (Signed)
BP readings reviewed.  Will increase labetalol dose from 200 mg bid to 300 mg bid .  New prescription has been sent to  pharmacy

## 2021-07-16 NOTE — Telephone Encounter (Signed)
Pt resent mychart message and message has been routed to dr. Derrel Nip.

## 2021-07-16 NOTE — Assessment & Plan Note (Signed)
Recent home BP readings reviewed.  Will increase labetalol dose from 200 mg bid to 300 mg bid .  New prescription has been sent to  pharmacy

## 2021-07-24 ENCOUNTER — Other Ambulatory Visit: Payer: Self-pay | Admitting: Internal Medicine

## 2021-07-24 MED ORDER — EZETIMIBE 10 MG PO TABS: 10.0000 mg | ORAL_TABLET | Freq: Every day | ORAL | 1 refills | Status: DC

## 2021-07-24 NOTE — Telephone Encounter (Signed)
Zetia has been refilled.

## 2021-07-24 NOTE — Telephone Encounter (Signed)
Patient is calling in to request a refill on her ezetimibe (ZETIA) 10 MG tablet.Please send to the [facility] in [place] on [address].

## 2021-08-04 ENCOUNTER — Other Ambulatory Visit: Payer: Self-pay

## 2021-08-04 ENCOUNTER — Ambulatory Visit (INDEPENDENT_AMBULATORY_CARE_PROVIDER_SITE_OTHER): Payer: Medicare Other | Admitting: Family

## 2021-08-04 ENCOUNTER — Encounter: Payer: Self-pay | Admitting: Family

## 2021-08-04 VITALS — BP 158/102 | HR 82 | Temp 96.4°F | Ht 60.0 in | Wt 170.2 lb

## 2021-08-04 DIAGNOSIS — M545 Low back pain, unspecified: Secondary | ICD-10-CM

## 2021-08-04 DIAGNOSIS — I10 Essential (primary) hypertension: Secondary | ICD-10-CM

## 2021-08-04 DIAGNOSIS — I701 Atherosclerosis of renal artery: Secondary | ICD-10-CM | POA: Diagnosis not present

## 2021-08-04 DIAGNOSIS — M25552 Pain in left hip: Secondary | ICD-10-CM | POA: Diagnosis not present

## 2021-08-04 DIAGNOSIS — M25551 Pain in right hip: Secondary | ICD-10-CM | POA: Diagnosis not present

## 2021-08-04 DIAGNOSIS — G8929 Other chronic pain: Secondary | ICD-10-CM

## 2021-08-04 MED ORDER — BUTALBITAL-APAP-CAFFEINE 50-325-40 MG PO TABS: 1.0000 | ORAL_TABLET | Freq: Four times a day (QID) | ORAL | 1 refills | Status: DC | PRN

## 2021-08-04 MED ORDER — LABETALOL HCL 200 MG PO TABS: 400.0000 mg | ORAL_TABLET | Freq: Two times a day (BID) | ORAL | 1 refills | Status: DC

## 2021-08-05 NOTE — Telephone Encounter (Signed)
Patient is calling in to check on the status of the message below.She would like for her medicine to be sent [facility] on [address].

## 2021-08-06 MED ORDER — TRAMADOL HCL 50 MG PO TABS: 100.0000 mg | ORAL_TABLET | Freq: Two times a day (BID) | ORAL | 5 refills | Status: AC

## 2021-08-06 NOTE — Progress Notes (Signed)
Acute Office Visit  Subjective:    Patient ID: Sabrina [name], female    DOB: May 31, 1941, 80 y.o.   MRN: [id number]  Chief Complaint  Patient presents with   Hypertension    Pt states her blood pressures have been running high since July. Pt states she's been having some back pain and headaches as well    HPI Patient is in today with c/o elevated blood pressure x 3-4 weeks. She reports having some back pain, fatigue, and headaches. She reports being taken off of her pain medication and not having anything. She feels this may be why her blood pressure is elevated. Pain is 10/10. She takes her blood pressure medication as directed. She has become frustrated with not being able to be prescribe. 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 Wynelle Link   Hyperlipidemia    Hypertension    Renal artery stenosis (HCC)    Secondary hyperparathyroidism (HCC)    Spinal stenosis of lumbar region at multiple levels    Subclavian arterial stenosis (HCC)     Past Surgical History:  Procedure Laterality Date   ABDOMINAL HYSTERECTOMY  1976   CAROTID ARTERY ANGIOPLASTY Left    CAROTID ENDARTERECTOMY Left    CHOLECYSTECTOMY     COLONOSCOPY WITH PROPOFOL N/A 08/16/2017   Procedure: COLONOSCOPY WITH PROPOFOL;  Surgeon: Midge Minium, MD;  Location: [facility] END[facility]COPY;  Service: Endoscopy;  Laterality: N/A;   CORONARY ANGIOPLASTY WITH STENT PLACEMENT  2000   CORONARY ARTERY BYPASS GRAFT  2000   CYSTOSCOPY WITH STENT PLACEMENT Bilateral    ESOPHAGOGASTRODUODENOSCOPY (EGD) WITH PROPOFOL N/A 08/16/2017   Procedure: ESOPHAGOGASTRODUODENOSCOPY (EGD) WITH PROPOFOL;  Surgeon: Midge Minium, MD;   Location: [facility] ENDOSCOPY;  Service: Endoscopy;  Laterality: N/A;   ESOPHAGOGASTRODUODENOSCOPY (EGD) WITH PROPOFOL N/A 06/29/2018   Procedure: ESOPHAGOGASTRODUODENOSCOPY (EGD) WITH PROPOFOL;  Surgeon: Pasty Spillers, MD;  Location: [facility] ENDOSCOPY;  Service: Endoscopy;  Laterality: N/A;   JOINT REPLACEMENT Bilateral 2000   RENAL ARTERY ANGIOPLASTY Bilateral mARCH 2015   TOE AMPUTATION Right    small toe   TONSILLECTOMY AND ADENOIDECTOMY     TOTAL HIP ARTHROPLASTY Left    TOTAL HIP ARTHROPLASTY Right 2015    Family History  Problem Relation Age of Onset   Stroke Mother    Hypertension Mother    Diabetes Mother    Hypertension Father    Heart disease Sister        MI   Multiple sclerosis Daughter    Multiple sclerosis Son    Cerebral aneurysm Son    Seizures Son    Cerebral aneurysm Son    Breast cancer Paternal Aunt 49    Social History   Socioeconomic History   Marital status: Divorced    Spouse name: Not on file   Number of children: Not on file   Years of education: Not on file   Highest education level: Not on file  Occupational History   Occupation: retired  Tobacco Use   Smoking status: Former    Types: Cigarettes    Quit date: 10/25/1998    Years since quitting: 22.7   Smokeless tobacco: Never  Vaping Use   Vaping Use: Never used  Substance and Sexual Activity   Alcohol use: No    Alcohol/week: 0.0 standard drinks   Drug use: Never   Sexual activity: Not Currently  Other Topics Concern   Not on file  Social History Narrative   Not on file   Social Determinants of Health   Financial Resource Strain: Not on file  Food Insecurity: Not on file  Transportation Needs: Not on file  Physical Activity: Not on file  Stress: Not on file  Social Connections: Not on file  Intimate Partner Violence: Not on file    Outpatient Medications Prior to Visit  Medication Sig Dispense Refill   aspirin 81 MG tablet Take 81 mg by mouth daily.     chlordiazePOXIDE  (LIBRIUM) 25 MG capsule TAKE 1 CAPSULE BY MOUTH ONCE DAILY AS NEEDED FOR ANXIETY 30 capsule 5   clopidogrel (PLAVIX) 75 MG tablet Take 1 tablet (75 mg total) by mouth daily. To prevent strokes 90 tablet 1   ezetimibe (ZETIA) 10 MG tablet Take 1 tablet by mouth once daily 90 tablet 0   ezetimibe (ZETIA) 10 MG tablet Take 1 tablet (10 mg total) by mouth daily. 90 tablet 1   hydrALAZINE (APRESOLINE) 50 MG tablet TAKE 1 TABLET BY MOUTH THREE TIMES DAILY AS NEEDED FOR BLOOD PRESSURE GREATER THAN 150 270 tablet 0   isosorbide mononitrate (IMDUR) 30 MG 24 hr tablet Take by mouth.     liothyronine (CYTOMEL) 5 MCG tablet Take 1 tablet (5 mcg total) by mouth daily. For underactive thyroid 90 tablet 1   NARCAN 4 MG/0.1ML LIQD nasal spray kit      nortriptyline (PAMELOR) 10 MG capsule Take 1 capsule (10 mg total) by mouth 2 (two) times daily. For chronic headaches 180 capsule 1   Omega 3 1000 MG CAPS Take 1 capsule by mouth 2 (two) times daily.      ondansetron (ZOFRAN ODT) 4 MG disintegrating tablet Take 1 tablet (4 mg total) by mouth every 8 (eight) hours as needed for nausea or vomiting. 30 tablet 0   pantoprazole (PROTONIX) 40 MG tablet Take 1 tablet by mouth once daily 90 tablet 0   pantoprazole (PROTONIX) 40 MG tablet Take 1 tablet (40 mg total) by mouth daily. 30 tablet 5   traMADol (ULTRAM) 50 MG tablet Take 2 tablets (100 mg total) by mouth 2 (two) times daily. 120 tablet 0   valsartan (DIOVAN) 320 MG tablet Take 1 tablet (320 mg total) by mouth daily. 90 tablet 0   labetalol (NORMODYNE) 300 MG tablet Take 1 tablet (300 mg total) by mouth 2 (two) times daily. For hypertension 180 tablet 0   No facility-administered medications prior to visit.    Allergies  Allergen Reactions   Citalopram     Throat closing    Dilaudid [Hydromorphone Hcl] Nausea And Vomiting   Hydrochlorothiazide Other (See Comments)    Decreased GFR (Nov 2015)   Nsaids     CKD stage III - avoid nephrotoxic drugs   Nubain  [Nalbuphine Hcl]     Burning sensation in back   Penicillins Itching   Prasugrel Itching   Statins Itching    Review of Systems  Constitutional:  Positive for fatigue.  Respiratory: Negative.    Cardiovascular: Negative.   Gastrointestinal: Negative.   Endocrine: Negative.   Musculoskeletal:  Positive for back pain.  Skin: Negative.   Neurological: Negative.   Psychiatric/Behavioral:  Positive for agitation and suicidal ideas.  All other systems reviewed and are negative.     Objective:    Physical Exam Vitals and nursing note reviewed.  Constitutional:      Appearance: Normal appearance.  Cardiovascular:     Rate and Rhythm: Normal rate.     Pulses: Normal pulses.     Heart sounds: Normal heart sounds.  Pulmonary:     Effort: Pulmonary effort is normal.     Breath sounds: Normal breath sounds.  Abdominal:     General: Abdomen is flat.     Palpations: Abdomen is soft.  Musculoskeletal:        General: Tenderness present.     Cervical back: Normal range of motion and neck supple.     Right lower leg: No edema.     Left lower leg: No edema.     Comments: Low back tenderness  Skin:    General: Skin is warm and dry.  Neurological:     General: No focal deficit present.     Mental Status: She is alert and oriented to person, place, and time.  Psychiatric:        Mood and Affect: Mood normal.        Behavior: Behavior normal.    BP (!) 158/102   Pulse 82   Temp (!) 96.4 F (35.8 C)   Ht 5' (1.524 m)   Wt 170 lb 3.2 oz (77.2 kg)   SpO2 97%   BMI 33.24 kg/m  Wt Readings from Last 3 Encounters:  08/04/21 170 lb 3.2 oz (77.2 kg)  07/06/21 168 lb 9.6 oz (76.5 kg)  06/12/21 167 lb 15.9 oz (76.2 kg)    Health Maintenance Due  Topic Date Due   Hepatitis C Screening  Never done   Zoster Vaccines- Shingrix (1 of 2) Never done   DEXA SCAN  Never done   COVID-19 Vaccine (4 - Booster for Pfizer series) 01/23/2021   MAMMOGRAM  07/24/2021    There are no  preventive care reminders to display for this patient.   Lab Results  Component Value Date   TSH 7.67 (H) 06/22/2021   Lab Results  Component Value Date   WBC 6.2 06/13/2021   HGB 13.2 06/13/2021   HCT 38.9 06/13/2021   MCV 86.4 06/13/2021   PLT 264 06/13/2021   Lab Results  Component Value Date   NA 136 06/22/2021   K 4.2 06/22/2021   CO2 25 06/22/2021   GLUCOSE 115 (H) 06/22/2021   BUN 17 06/22/2021   CREATININE 1.29 (H) 06/22/2021   BILITOT 0.6 06/13/2021   ALKPHOS 70 06/13/2021   AST 21 06/13/2021   ALT 16 06/13/2021   PROT 8.2 (H) 06/13/2021   ALBUMIN 4.2 06/13/2021   CALCIUM 9.8 06/22/2021   ANIONGAP 7 06/13/2021   GFR 39.52 (L) 06/22/2021   Lab Results  Component Value Date   CHOL 205 (H) 07/29/2020   Lab Results  Component Value Date   HDL 49.10 07/29/2020   Lab Results  Component Value Date   LDLCALC 130 (H) 07/29/2020   Lab Results  Component Value Date   TRIG 131.0 07/29/2020   Lab Results  Component Value Date   CHOLHDL 4 07/29/2020   Lab Results  Component Value Date   HGBA1C 5.9 07/19/2016       Assessment & Plan:   Problem List Items Addressed This Visit     Essential hypertension - Primary   Relevant Medications   labetalol (NORMODYNE) 200 MG tablet  Other Visit Diagnoses     Chronic right-sided low back pain, unspecified whether sciatica present       Relevant Medications   butalbital-acetaminophen-caffeine (FIORICET) 50-325-40 MG tablet   Chronic hip pain, bilateral       Relevant Medications   butalbital-acetaminophen-caffeine (FIORICET) 50-325-40 MG tablet        Meds ordered this encounter  Medications   labetalol (NORMODYNE) 200 MG tablet    Sig: Take 2 tablets (400 mg total) by mouth 2 (two) times daily. For hypertension    Dispense:  120 tablet    Refill:  1    NOTE DOSE INCREASE   butalbital-acetaminophen-caffeine (FIORICET) 50-325-40 MG tablet    Sig: Take 1 tablet by mouth every 6 (six) hours as needed  for headache.    Dispense:  120 tablet    Refill:  1   Spoke with Dr. Derrel Nip about patient's chronic pain. Suggests Fioricet. Labetalol ordered to help reduce blood pressure. Follow-up with Dr. Derrel Nip in 2-3 weeks. Call the office with any questions or concerns. Recheck as scheduled and sooner as needed.   Kennyth Arnold, FNP

## 2021-08-06 NOTE — Telephone Encounter (Signed)
Refilled: 07/07/2021 Last OV: 07/06/2021 Next OV: 09/07/2021

## 2021-08-17 MED ORDER — LIOTHYRONINE SODIUM 5 MCG PO TABS: 5.0000 ug | ORAL_TABLET | Freq: Every day | ORAL | 1 refills | Status: DC

## 2021-08-17 NOTE — Telephone Encounter (Signed)
Patient On 8/29 Thyroid function underactive and advised increase Synthroid on 06/25/21 patient was switched to liothyronine, do you need repeat TSH? Plus will ask patient about referral to endo from PCP message on 06/25/21 cannot find was answered. Awaiting call back from patient to set up lab and confirm Endo referral.

## 2021-08-17 NOTE — Telephone Encounter (Signed)
Yes,  tsh was ordered 2 months ago .  Ok to refill current dose so she doesn't run out

## 2021-08-18 NOTE — Telephone Encounter (Signed)
Tried to reach patient Lab in for TSH needs lab appointment.

## 2021-08-25 MED ORDER — HYDRALAZINE HCL 50 MG PO TABS: ORAL_TABLET | ORAL | 1 refills | Status: DC

## 2021-08-26 NOTE — Telephone Encounter (Signed)
[facility] called, they received a verbal for changing the  hydrALAZINE (APRESOLINE) 50 MG tablet, from 1 pill to 1 1/2 pills. They need a prescription stating that, insurance is giving them a problem with verbal.

## 2021-08-26 NOTE — Telephone Encounter (Signed)
There is no documentation for the below. Ok to send in?

## 2021-08-28 ENCOUNTER — Other Ambulatory Visit: Payer: Self-pay | Admitting: Internal Medicine

## 2021-08-28 MED ORDER — HYDRALAZINE HCL 50 MG PO TABS: 75.0000 mg | ORAL_TABLET | Freq: Three times a day (TID) | ORAL | 1 refills | Status: DC

## 2021-09-07 ENCOUNTER — Ambulatory Visit (INDEPENDENT_AMBULATORY_CARE_PROVIDER_SITE_OTHER): Payer: Medicare Other | Admitting: Internal Medicine

## 2021-09-07 ENCOUNTER — Other Ambulatory Visit: Payer: Self-pay

## 2021-09-07 ENCOUNTER — Encounter: Payer: Self-pay | Admitting: Internal Medicine

## 2021-09-07 VITALS — BP 142/88 | HR 76 | Temp 96.6°F | Ht 60.0 in | Wt 168.4 lb

## 2021-09-07 DIAGNOSIS — I130 Hypertensive heart and chronic kidney disease with heart failure and stage 1 through stage 4 chronic kidney disease, or unspecified chronic kidney disease: Secondary | ICD-10-CM

## 2021-09-07 DIAGNOSIS — N183 Chronic kidney disease, stage 3 unspecified: Secondary | ICD-10-CM | POA: Diagnosis not present

## 2021-09-07 DIAGNOSIS — E039 Hypothyroidism, unspecified: Secondary | ICD-10-CM | POA: Diagnosis not present

## 2021-09-07 DIAGNOSIS — M47812 Spondylosis without myelopathy or radiculopathy, cervical region: Secondary | ICD-10-CM | POA: Diagnosis not present

## 2021-09-07 DIAGNOSIS — I10 Essential (primary) hypertension: Secondary | ICD-10-CM | POA: Diagnosis not present

## 2021-09-07 DIAGNOSIS — E782 Mixed hyperlipidemia: Secondary | ICD-10-CM

## 2021-09-07 DIAGNOSIS — I701 Atherosclerosis of renal artery: Secondary | ICD-10-CM | POA: Diagnosis not present

## 2021-09-07 MED ORDER — VALSARTAN 320 MG PO TABS: 320.0000 mg | ORAL_TABLET | Freq: Every day | ORAL | 0 refills | Status: DC

## 2021-09-07 NOTE — Progress Notes (Signed)
Subjective:  Patient ID: Sabrina [name], female    DOB: 1941/05/26  Age: 80 y.o. 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 Diagnoses of Essential hypertension, Mixed hyperlipidemia, Acquired hypothyroidism, and Cervical spondylosis without myelopathy were also pertinent to this visit.  HPI Sabrina [name] presents for  Chief Complaint  Patient presents with   Follow-up    3 week follow up on hypertension   This visit occurred during the SARS-CoV-2 public health emergency.  Safety protocols were in place, including screening questions prior to the visit, additional usage of staff PPE, and extensive cleaning of exam room while observing appropriate contact time as indicated for disinfecting solutions.   80 yr old AA female with labile hypertension,  chronic headache pain presents for follow up on medication changes.  At  last visit on 9/22 with me her labetalol dose was increased to 300 mg bid ,  followed by incremental increase to 400 mg bid on oct 11 by NP Justin Mend for persistent readings as high as 244 systolic  .  She states that she is Tolerating higher dose and home readings  have been improved and averaging in the ballpark of 149/70  now.  She continues to take hydralazine  75 mg tid, valsartan 320 mg daily, and Imdur 30 mg daily  1) Headaches:   chronic,  currently managed with  daily Pamelor and prn fioricet, averaging 2 daily.  However today's headache was brought on by an argument with the  manager at [employer]    Outpatient Medications Prior to Visit  Medication Sig Dispense Refill   aspirin 81 MG tablet Take 81 mg by mouth daily.     butalbital-acetaminophen-caffeine (FIORICET) 50-325-40 MG tablet Take 1 tablet by mouth every 6 (six) hours as needed for headache. 120 tablet 1   chlordiazePOXIDE (LIBRIUM) 25 MG capsule TAKE 1 CAPSULE BY MOUTH ONCE DAILY AS NEEDED FOR ANXIETY 30  capsule 5   clopidogrel (PLAVIX) 75 MG tablet Take 1 tablet (75 mg total) by mouth daily. To prevent strokes 90 tablet 1   ezetimibe (ZETIA) 10 MG tablet Take 1 tablet by mouth once daily 90 tablet 0   hydrALAZINE (APRESOLINE) 50 MG tablet Take 1.5 tablets (75 mg total) by mouth 3 (three) times daily. Marland KitchenNOTE DOSE INCREASE TO 75 MG 405 tablet 1   isosorbide mononitrate (IMDUR) 30 MG 24 hr tablet Take by mouth.     labetalol (NORMODYNE) 200 MG tablet Take 2 tablets (400 mg total) by mouth 2 (two) times daily. For hypertension 120 tablet 1   liothyronine (CYTOMEL) 5 MCG tablet Take 1 tablet (5 mcg total) by mouth daily. For underactive thyroid 90 tablet 1   nortriptyline (PAMELOR) 10 MG capsule Take 1 capsule (10 mg total) by mouth 2 (two) times daily. For chronic headaches 180 capsule 1   Omega 3 1000 MG CAPS Take 1 capsule by mouth 2 (two) times daily.      pantoprazole (PROTONIX) 40 MG tablet Take 1 tablet by mouth once daily 90 tablet 0   pantoprazole (PROTONIX) 40 MG tablet Take 1 tablet (40 mg total) by mouth daily. 30 tablet 5   valsartan (DIOVAN) 320 MG tablet Take 1 tablet (320 mg total) by mouth daily. 90 tablet 0   NARCAN 4 MG/0.1ML LIQD nasal spray kit  (Patient not taking: Reported on 09/07/2021)     ezetimibe (ZETIA) 10 MG tablet Take 1 tablet (  10 mg total) by mouth daily. 90 tablet 1   ondansetron (ZOFRAN ODT) 4 MG disintegrating tablet Take 1 tablet (4 mg total) by mouth every 8 (eight) hours as needed for nausea or vomiting. (Patient not taking: Reported on 09/07/2021) 30 tablet 0   No facility-administered medications prior to visit.    Review of Systems;  Patient denies  fevers, malaise, unintentional weight loss, skin rash, eye pain, sinus congestion and sinus pain, sore throat, dysphagia,  hemoptysis , cough, dyspnea, wheezing, chest pain, palpitations, orthopnea, edema, abdominal pain, nausea, melena, diarrhea, constipation, flank pain, dysuria, hematuria, urinary  Frequency,  nocturia, numbness, tingling, seizures,  Focal weakness, Loss of consciousness,  Tremor, insomnia, depression, anxiety, and suicidal ideation.      Objective:  BP (!) 142/88 (BP Location: Left Arm, Patient Position: Sitting, Cuff Size: Normal)   Pulse 76   Temp (!) 96.6 F (35.9 C) (Temporal)   Ht 5' (1.524 m)   Wt 168 lb 6.4 oz (76.4 kg)   SpO2 95%   BMI 32.89 kg/m   BP Readings from Last 3 Encounters:  09/07/21 (!) 142/88  08/04/21 (!) 158/102  07/06/21 (!) 162/84    Wt Readings from Last 3 Encounters:  09/07/21 168 lb 6.4 oz (76.4 kg)  08/04/21 170 lb 3.2 oz (77.2 kg)  07/06/21 168 lb 9.6 oz (76.5 kg)    General appearance: alert, cooperative and appears stated age Ears: normal TM's and external ear canals both ears Throat: lips, mucosa, and tongue normal; teeth and gums normal Neck: no adenopathy, no carotid bruit, supple, symmetrical, trachea midline and thyroid not enlarged, symmetric, no tenderness/mass/nodules Back: symmetric, no curvature. ROM normal. No CVA tenderness. Lungs: clear to auscultation bilaterally Heart: regular rate and rhythm, S1, S2 normal, no murmur, click, rub or gallop Abdomen: soft, non-tender; bowel sounds normal; no masses,  no organomegaly Pulses: 2+ and symmetric Skin: Skin color, texture, turgor normal. No rashes or lesions Lymph nodes: Cervical, supraclavicular, and axillary nodes normal.  Lab Results  Component Value Date   HGBA1C 5.9 07/19/2016   HGBA1C 5.8 06/02/2015    Lab Results  Component Value Date   CREATININE 1.29 (H) 06/22/2021   CREATININE 1.24 (H) 06/13/2021   CREATININE 1.24 (H) 09/09/2020    Lab Results  Component Value Date   WBC 6.2 06/13/2021   HGB 13.2 06/13/2021   HCT 38.9 06/13/2021   PLT 264 06/13/2021   GLUCOSE 115 (H) 06/22/2021   CHOL 205 (H) 07/29/2020   TRIG 131.0 07/29/2020   HDL 49.10 07/29/2020   LDLDIRECT 109.0 03/05/2016   LDLCALC 130 (H) 07/29/2020   ALT 16 06/13/2021   AST 21  06/13/2021   NA 136 06/22/2021   K 4.2 06/22/2021   CL 101 06/22/2021   CREATININE 1.29 (H) 06/22/2021   BUN 17 06/22/2021   CO2 25 06/22/2021   TSH 7.67 (H) 06/22/2021   INR 1.0 03/27/2014   HGBA1C 5.9 07/19/2016    CT HEAD WO CONTRAST (5MM)  Result Date: 06/12/2021 CLINICAL DATA:  Headache EXAM: CT HEAD WITHOUT CONTRAST TECHNIQUE: Contiguous axial images were obtained from the base of the skull through the vertex without intravenous contrast. COMPARISON:  CT brain 06/04/2018 FINDINGS: Brain: No acute territorial infarction, hemorrhage, or intracranial mass. Mild atrophy. Stable ventricle size. Stable extra-axial CSF along the left inferior frontal lobe. Vascular: No hyperdense vessels.  Carotid vascular calcification Skull: Normal. Negative for fracture or focal lesion. Sinuses/Orbits: No acute finding. Other: None IMPRESSION: No CT evidence for  acute intracranial abnormality. Electronically Signed   By: Donavan Foil M.D.   On: 06/12/2021 22:10   DG Chest Port 1 View  Result Date: 06/13/2021 CLINICAL DATA:  Cough, headache, and hypertension since Monday. EXAM: PORTABLE CHEST 1 VIEW COMPARISON:  11/26/2015 FINDINGS: Postoperative changes in the mediastinum. Mild cardiac enlargement. Central vascular congestion. No airspace disease, consolidation, or effusions. No pneumothorax. Calcified and tortuous aorta. Degenerative changes in the spine and shoulders. Mediastinal contours appear intact. IMPRESSION: Cardiac enlargement with mild pulmonary vascularity. No edema or consolidation. Electronically Signed   By: Lucienne Capers M.D.   On: 06/13/2021 00:11    Assessment & Plan:   Problem List Items Addressed This Visit     Essential hypertension    Recent home BP readings reviewed.  Will continue  labetalol dose  Of 400 mg bid .  Continue valsartan 320 mg daily , hydralazine 75 mg tid.       Relevant Medications   valsartan (DIOVAN) 320 MG tablet   Other Relevant Orders   Comprehensive  metabolic panel   HLD (hyperlipidemia)    Managed with Zetia due to statin myalgias  Lab Results  Component Value Date   CHOL 205 (H) 07/29/2020   HDL 49.10 07/29/2020   LDLCALC 130 (H) 07/29/2020   LDLDIRECT 109.0 03/05/2016   TRIG 131.0 07/29/2020   CHOLHDL 4 07/29/2020        Relevant Medications   valsartan (DIOVAN) 320 MG tablet   Other Relevant Orders   Lipid panel   Cervical spondylosis without myelopathy    Continue fioricet prn occipital headaches as she has C/I to NSAIDS      Acquired hypothyroidism   Relevant Orders   TSH   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    Dispense:  90 tablet    Refill:  0    Future refills to come from PCP    Medications Discontinued During This Encounter  Medication Reason   ezetimibe (ZETIA) 10 MG tablet    ondansetron (ZOFRAN ODT) 4 MG disintegrating tablet    valsartan (DIOVAN) 320 MG tablet Reorder    Follow-up: Return in about 3 months (around 12/08/2021).   Crecencio Mc, MD

## 2021-09-07 NOTE — Assessment & Plan Note (Signed)
Recent home BP readings reviewed.  Will continue  labetalol dose  Of 400 mg bid .  Continue valsartan 320 mg daily , hydralazine 75 mg tid.

## 2021-09-07 NOTE — Patient Instructions (Signed)
Your blood pressure is improved.  Continue taking 400 mg of labetalol every 12 hours   You do NOT  have to separate your thyroid medication from your blood pressure medication  anymore,  you are on a different form of thyroid  medication

## 2021-09-07 NOTE — Assessment & Plan Note (Signed)
Managed with Zetia due to statin myalgias  Lab Results  Component Value Date   CHOL 205 (H) 07/29/2020   HDL 49.10 07/29/2020   LDLCALC 130 (H) 07/29/2020   LDLDIRECT 109.0 03/05/2016   TRIG 131.0 07/29/2020   CHOLHDL 4 07/29/2020

## 2021-09-07 NOTE — Assessment & Plan Note (Signed)
Continue fioricet prn occipital headaches as she has C/I to NSAIDS

## 2021-09-21 ENCOUNTER — Encounter: Payer: Self-pay | Admitting: Internal Medicine

## 2021-09-21 DIAGNOSIS — Z961 Presence of intraocular lens: Secondary | ICD-10-CM | POA: Diagnosis not present

## 2021-09-22 ENCOUNTER — Telehealth: Payer: Medicare Other | Admitting: Adult Health

## 2021-09-22 ENCOUNTER — Emergency Department: Payer: Medicare Other

## 2021-09-22 ENCOUNTER — Telehealth: Payer: Self-pay

## 2021-09-22 ENCOUNTER — Other Ambulatory Visit: Payer: Self-pay

## 2021-09-22 ENCOUNTER — Emergency Department
Admission: EM | Admit: 2021-09-22 | Discharge: 2021-09-22 | Disposition: A | Discharge status: Home / Self Care | Payer: Medicare Other | Attending: Emergency Medicine | Admitting: Emergency Medicine

## 2021-09-22 DIAGNOSIS — Z20822 Contact with and (suspected) exposure to covid-19: Secondary | ICD-10-CM | POA: Insufficient documentation

## 2021-09-22 DIAGNOSIS — I10 Essential (primary) hypertension: Secondary | ICD-10-CM | POA: Diagnosis not present

## 2021-09-22 DIAGNOSIS — I2510 Atherosclerotic heart disease of native coronary artery without angina pectoris: Secondary | ICD-10-CM | POA: Diagnosis not present

## 2021-09-22 DIAGNOSIS — I130 Hypertensive heart and chronic kidney disease with heart failure and stage 1 through stage 4 chronic kidney disease, or unspecified chronic kidney disease: Secondary | ICD-10-CM | POA: Diagnosis not present

## 2021-09-22 DIAGNOSIS — Z87891 Personal history of nicotine dependence: Secondary | ICD-10-CM | POA: Insufficient documentation

## 2021-09-22 DIAGNOSIS — J441 Chronic obstructive pulmonary disease with (acute) exacerbation: Secondary | ICD-10-CM | POA: Insufficient documentation

## 2021-09-22 DIAGNOSIS — Z96643 Presence of artificial hip joint, bilateral: Secondary | ICD-10-CM | POA: Insufficient documentation

## 2021-09-22 DIAGNOSIS — R52 Pain, unspecified: Secondary | ICD-10-CM | POA: Diagnosis not present

## 2021-09-22 DIAGNOSIS — I509 Heart failure, unspecified: Secondary | ICD-10-CM | POA: Insufficient documentation

## 2021-09-22 DIAGNOSIS — R509 Fever, unspecified: Secondary | ICD-10-CM | POA: Diagnosis not present

## 2021-09-22 DIAGNOSIS — E039 Hypothyroidism, unspecified: Secondary | ICD-10-CM | POA: Insufficient documentation

## 2021-09-22 DIAGNOSIS — Z951 Presence of aortocoronary bypass graft: Secondary | ICD-10-CM | POA: Diagnosis not present

## 2021-09-22 DIAGNOSIS — Z79899 Other long term (current) drug therapy: Secondary | ICD-10-CM | POA: Insufficient documentation

## 2021-09-22 DIAGNOSIS — N183 Chronic kidney disease, stage 3 unspecified: Secondary | ICD-10-CM | POA: Insufficient documentation

## 2021-09-22 DIAGNOSIS — Z7982 Long term (current) use of aspirin: Secondary | ICD-10-CM | POA: Diagnosis not present

## 2021-09-22 DIAGNOSIS — U071 COVID-19: Secondary | ICD-10-CM | POA: Insufficient documentation

## 2021-09-22 DIAGNOSIS — Z8616 Personal history of COVID-19: Secondary | ICD-10-CM

## 2021-09-22 DIAGNOSIS — R062 Wheezing: Secondary | ICD-10-CM | POA: Diagnosis not present

## 2021-09-22 DIAGNOSIS — R0602 Shortness of breath: Secondary | ICD-10-CM | POA: Diagnosis not present

## 2021-09-22 DIAGNOSIS — R0689 Other abnormalities of breathing: Secondary | ICD-10-CM | POA: Diagnosis not present

## 2021-09-22 HISTORY — DX: Personal history of COVID-19: Z86.16

## 2021-09-22 LAB — COMPREHENSIVE METABOLIC PANEL
ALT: 38 U/L (ref 0–44)
AST: 43 U/L — ABNORMAL HIGH (ref 15–41)
Albumin: 3.9 g/dL (ref 3.5–5.0)
Alkaline Phosphatase: 76 U/L (ref 38–126)
Anion gap: 8 (ref 5–15)
BUN: 17 mg/dL (ref 8–23)
CO2: 25 mmol/L (ref 22–32)
Calcium: 9.3 mg/dL (ref 8.9–10.3)
Chloride: 98 mmol/L (ref 98–111)
Creatinine, Ser: 1.1 mg/dL — ABNORMAL HIGH (ref 0.44–1.00)
GFR, Estimated: 51 mL/min — ABNORMAL LOW (ref >60)
Glucose, Bld: 122 mg/dL — ABNORMAL HIGH (ref 70–99)
Potassium: 3.6 mmol/L (ref 3.5–5.1)
Sodium: 131 mmol/L — ABNORMAL LOW (ref 135–145)
Total Bilirubin: 0.6 mg/dL (ref 0.3–1.2)
Total Protein: 7.8 g/dL (ref 6.5–8.1)

## 2021-09-22 LAB — CBC
HCT: 34.5 % — ABNORMAL LOW (ref 36.0–46.0)
Hemoglobin: 11.8 g/dL — ABNORMAL LOW (ref 12.0–15.0)
MCH: 29.8 pg (ref 26.0–34.0)
MCHC: 34.2 g/dL (ref 30.0–36.0)
MCV: 87.1 fL (ref 80.0–100.0)
Platelets: 215 10*3/uL (ref 150–400)
RBC: 3.96 MIL/uL (ref 3.87–5.11)
RDW: 13.5 % (ref 11.5–15.5)
WBC: 10.2 10*3/uL (ref 4.0–10.5)
nRBC: 0 % (ref 0.0–0.2)

## 2021-09-22 LAB — RESP PANEL BY RT-PCR (FLU A&B, COVID) ARPGX2
Influenza A by PCR: NEGATIVE
Influenza B by PCR: NEGATIVE
SARS Coronavirus 2 by RT PCR: POSITIVE — AB

## 2021-09-22 MED ORDER — IPRATROPIUM-ALBUTEROL 0.5-2.5 (3) MG/3ML IN SOLN
3.0000 mL | Freq: Once | RESPIRATORY_TRACT | Status: AC
  Administered 2021-09-22: 3 mL via RESPIRATORY_TRACT

## 2021-09-22 MED ORDER — ALBUTEROL SULFATE HFA 108 (90 BASE) MCG/ACT IN AERS
2.0000 | INHALATION_SPRAY | RESPIRATORY_TRACT | Status: DC | PRN
  Filled 2021-09-22: qty 6.7

## 2021-09-22 MED ORDER — ALBUTEROL SULFATE HFA 108 (90 BASE) MCG/ACT IN AERS: 2.0000 | INHALATION_SPRAY | Freq: Four times a day (QID) | RESPIRATORY_TRACT | 2 refills | Status: DC | PRN

## 2021-09-22 MED ORDER — PREDNISONE 20 MG PO TABS
60.0000 mg | ORAL_TABLET | Freq: Once | ORAL | Status: AC
  Administered 2021-09-22: 60 mg via ORAL

## 2021-09-22 MED ORDER — IBUPROFEN 600 MG PO TABS
600.0000 mg | ORAL_TABLET | Freq: Once | ORAL | Status: AC
  Administered 2021-09-22: 600 mg via ORAL

## 2021-09-22 MED ORDER — PREDNISONE 50 MG PO TABS: ORAL_TABLET | ORAL | 0 refills | Status: DC

## 2021-09-22 MED ORDER — ACETAMINOPHEN 500 MG PO TABS
1000.0000 mg | ORAL_TABLET | Freq: Once | ORAL | Status: AC
  Administered 2021-09-22: 1000 mg via ORAL

## 2021-09-22 MED ORDER — NORTRIPTYLINE HCL 10 MG PO CAPS
10.0000 mg | ORAL_CAPSULE | Freq: Two times a day (BID) | ORAL | 1 refills | Status: DC
Start: 2021-09-22 — End: 2021-12-28

## 2021-09-22 NOTE — ED Notes (Signed)
Pt reporting she can take ibuprofen. RN clarified allergy listed is sometimes she is itchy. Pt took tylenol at 1030

## 2021-09-22 NOTE — Telephone Encounter (Signed)
Pt is currently at [facility] ED and has tested positive for covid.

## 2021-09-22 NOTE — Telephone Encounter (Signed)
Noted  

## 2021-09-22 NOTE — Discharge Instructions (Addendum)
Please take the prednisone for the next 5 days.  Please also use the albuterol inhaler about 4 puffs every 4 hours.  If your shortness of breath is worsening, please return to the emergency department.  Please take the prednisone

## 2021-09-22 NOTE — Telephone Encounter (Signed)
Pt called in regards to appt. Pt apologizes for missing appt. Pt is currently in [facility] ED. During the time of the visit she was unable to do her visit because she is feeling very unwell which made her go to the ED

## 2021-09-22 NOTE — Telephone Encounter (Signed)
Pt called three times to start VV with Sharyn Lull Flinchum at 1:30p. Two different messages left on VM.

## 2021-09-22 NOTE — Progress Notes (Deleted)
Virtual Visit via Video Note  I connected with Sabrina [name] on 09/22/21 at  1:30 PM EST by a video enabled telemedicine application and verified that I am speaking with the correct person using two identifiers.  Location: Patient: at home  Provider: Provider: Provider's office at  [facility], [place].      I discussed the limitations of evaluation and management by telemedicine and the availability of in person appointments. The patient expressed understanding and agreed to proceed.  History of Present Illness:    Observations/Objective:   Assessment and Plan:   Follow Up Instructions:    I discussed the assessment and treatment plan with the patient. The patient was provided an opportunity to ask questions and all were answered. The patient agreed with the plan and demonstrated an understanding of the instructions.   The patient was advised to call back or seek an in-person evaluation if the symptoms worsen or if the condition fails to improve as anticipated.  I provided *** minutes of non-face-to-face time during this encounter.   Marcille Buffy, FNP

## 2021-09-22 NOTE — ED Provider Notes (Signed)
[facility]  ____________________________________________   Event Date/Time   First MD Initiated Contact with Patient 09/22/21 2030     (approximate)  I have reviewed the triage vital signs and the nursing notes.   HISTORY  Chief Complaint Fever and Shortness of Breath    HPI Sabrina [name] is a 80 y.o. female with past medical history of coronary disease, CKD, hypertension, hyperlipidemia, renal artery stenosis, apparently recent diagnosis of COPD who presents with fever and shortness of breath.  Patient symptoms started yesterday.  She endorses a cough that is nonproductive as well as shortness of breath that is worse with lying flat.  She feels like she has wheezing when lying flat which improves when she sits up.  She has also had fever and overall fatigue.  Denies abdominal pain nausea vomiting.  No chest pain.  Has still been tolerating p.o.  Denies lower extremity swelling or pain.  Patient does endorse some bilateral side pain since sitting in the recliner in the ED.  No urinary symptoms.  She notes that her blood pressure has been elevated over the last several days and is historically been difficult to control, she is on multiple antihypertensives.  She has a chronic headache but denies new headache.         Past Medical History:  Diagnosis Date   Amputation of fifth toe, right, traumatic, subsequent encounter (HCC) 06/18/2019   Anemia of chronic kidney failure    Anxiety    CAD (coronary artery disease)    Carotid artery stenosis    Chronic kidney disease, stage III (moderate) (HCC)    Followed by Dr. 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, stage III  (HCC) 09/07/2021   Lichenified rash 04/27/2021   Insomnia due to anxiety and fear 01/29/2021   Obesity (BMI 30.0-34.9) 11/25/2020   Fatigue 11/25/2020   Acquired hypothyroidism 07/30/2020   Fatigue due to depression 07/26/2020   Encounter for preventive health examination 05/06/2020   Chronic pain syndrome 03/27/2020   Lumbar facet arthropathy 03/27/2020   Pain due to onychomycosis of toenails of both feet 06/18/2019   Callus 06/18/2019   Ankle swelling 04/22/2019   Educated about COVID-19 virus infection 03/21/2019   Anemia of chronic kidney failure, stage 3 (moderate) (HCC) 02/22/2018   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HCC) 08/04/2017   Aortic aneurysm, abdominal 07/05/2017   Chronic breast pain 12/14/2016   Atherosclerosis of renal artery (HCC) 09/09/2016   Carotid stenosis 09/09/2016   Palpitations 06/01/2016   Dizziness 12/04/2015   Cervical spondylosis without myelopathy 08/20/2015   Chronic daily headache 07/20/2015   Statin intolerance 05/20/2015   Chronic radicular lumbar pain 12/24/2014   Lumbar stenosis with neurogenic claudication 12/24/2014   GERD (gastroesophageal reflux disease) 12/06/2014   Chronic epigastric pain 11/14/2014   Chronic right hip pain 11/14/2014   Generalized anxiety disorder 09/08/2014   Peripheral vascular disease (HCC) 08/03/2014   CAD (coronary artery disease) 08/03/2014   HLD (hyperlipidemia) 06/24/2014   Chronic narcotic use 06/24/2014   Need for prophylactic vaccination and inoculation against influenza 06/24/2014   Osteoarthritis of right hip 04/04/2014   Need for vaccination with 13-polyvalent pneumococcal conjugate vaccine  12/28/2013   Aortic valve disorder 12/28/2013   Hip pain, bilateral 12/27/2013   Essential hypertension 12/27/2013    Past Surgical History:  Procedure Laterality Date   ABDOMINAL HYSTERECTOMY  1976   CAROTID ARTERY ANGIOPLASTY Left    CAROTID ENDARTERECTOMY Left    CHOLECYSTECTOMY     COLONOSCOPY WITH PROPOFOL  N/A 08/16/2017   Procedure: COLONOSCOPY WITH PROPOFOL;  Surgeon: [name], MD;  Location: [facility] ENDOSCOPY;  Service: Endoscopy;  Laterality: N/A;   CORONARY ANGIOPLASTY WITH STENT PLACEMENT  2000   CORONARY ARTERY BYPASS GRAFT  2000   CYSTOSCOPY WITH STENT PLACEMENT Bilateral    ESOPHAGOGASTRODUODENOSCOPY (EGD) WITH PROPOFOL N/A 08/16/2017   Procedure: ESOPHAGOGASTRODUODENOSCOPY (EGD) WITH PROPOFOL;  Surgeon: [name], MD;  Location: [facility] ENDOSCOPY;  Service: Endoscopy;  Laterality: N/A;   ESOPHAGOGASTRODUODENOSCOPY (EGD) WITH PROPOFOL N/A 06/29/2018   Procedure: ESOPHAGOGASTRODUODENOSCOPY (EGD) WITH PROPOFOL;  Surgeon: Virgel Manifold, MD;  Location: [facility] ENDOSCOPY;  Service: Endoscopy;  Laterality: N/A;   JOINT REPLACEMENT Bilateral 2000   RENAL ARTERY ANGIOPLASTY Bilateral mARCH 2015   TOE AMPUTATION Right    small toe   TONSILLECTOMY AND ADENOIDECTOMY     TOTAL HIP ARTHROPLASTY Left    TOTAL HIP ARTHROPLASTY Right 2015    Prior to Admission medications   Medication Sig Start Date End Date Taking? Authorizing Provider  albuterol (VENTOLIN HFA) 108 (90 Base) MCG/ACT inhaler Inhale 2 puffs into the lungs every 6 (six) hours as needed for wheezing or shortness of breath. 09/22/21  Yes Rada Hay, MD  predniSONE (DELTASONE) 50 MG tablet Take 1 $Remo'50mg'yzHuX$  pill daily for the next 5 days 09/22/21  Yes Rada Hay, MD  aspirin 81 MG tablet Take 81 mg by mouth daily.    [provider]  butalbital-acetaminophen-caffeine (FIORICET) 50-325-40 MG tablet Take 1 tablet by mouth every 6 (six) hours as needed for headache. 08/04/21   Dutch Quint B, FNP  chlordiazePOXIDE (LIBRIUM) 25 MG capsule TAKE 1 CAPSULE BY MOUTH ONCE DAILY AS NEEDED FOR ANXIETY 06/12/20   Crecencio Mc, MD  clopidogrel (PLAVIX) 75 MG tablet Take 1 tablet (75 mg total) by mouth daily. To prevent strokes 04/24/21   Crecencio Mc, MD  ezetimibe (ZETIA) 10 MG tablet Take 1 tablet by mouth once daily  07/24/21   Crecencio Mc, MD  hydrALAZINE (APRESOLINE) 50 MG tablet Take 1.5 tablets (75 mg total) by mouth 3 (three) times daily. Marland KitchenNOTE DOSE INCREASE TO 75 MG 08/28/21   Crecencio Mc, MD  isosorbide mononitrate (IMDUR) 30 MG 24 hr tablet Take by mouth. 04/22/21 04/22/22  [provider]  labetalol (NORMODYNE) 200 MG tablet Take 2 tablets (400 mg total) by mouth 2 (two) times daily. For hypertension 08/04/21   Dutch Quint B, FNP  liothyronine (CYTOMEL) 5 MCG tablet Take 1 tablet (5 mcg total) by mouth daily. For underactive thyroid 08/17/21   Crecencio Mc, MD  [facility] 4 MG/0.1ML LIQD nasal spray kit  12/12/19   [provider]  nortriptyline (PAMELOR) 10 MG capsule Take 1 capsule (10 mg total) by mouth 2 (two) times daily. For chronic headaches 09/22/21   Crecencio Mc, MD  Omega 3 1000 MG CAPS Take 1 capsule by mouth 2 (two) times daily.     [provider]  pantoprazole (PROTONIX) 40 MG tablet Take 1 tablet by mouth once daily 06/24/21   Crecencio Mc, MD  pantoprazole (PROTONIX) 40 MG tablet Take 1 tablet (40 mg total) by mouth  daily. 06/23/21   Crecencio Mc, MD  valsartan (DIOVAN) 320 MG tablet Take 1 tablet (320 mg total) by mouth daily. 09/07/21   Crecencio Mc, MD    Allergies Citalopram, Dilaudid [hydromorphone hcl], Hydrochlorothiazide, Nsaids, Nubain [nalbuphine hcl], Penicillins, Prasugrel, and Statins  Family History  Problem Relation Age of Onset   Stroke Mother    Hypertension Mother    Diabetes Mother    Hypertension Father    Heart disease Sister        MI   Multiple sclerosis Daughter    Multiple sclerosis Son    Cerebral aneurysm Son    Seizures Son    Cerebral aneurysm Son    Breast cancer Paternal Aunt 80    Social History Social History   Tobacco Use   Smoking status: Former    Types: Cigarettes    Quit date: 10/25/1998    Years since quitting: 22.9   Smokeless tobacco: Never  Vaping Use   Vaping Use: Never used   Substance Use Topics   Alcohol use: No    Alcohol/week: 0.0 standard drinks   Drug use: Never    Review of Systems   Review of Systems  Constitutional:  Positive for fatigue and fever. Negative for chills.  Eyes:  Negative for visual disturbance.  Respiratory:  Positive for cough and shortness of breath. Negative for chest tightness.   Cardiovascular:  Negative for chest pain and leg swelling.  Gastrointestinal:  Negative for abdominal pain and vomiting.  Genitourinary:  Positive for flank pain. Negative for dysuria.  Neurological:  Positive for headaches.  All other systems reviewed and are negative.  Physical Exam Updated Vital Signs BP (!) 155/80   Pulse 80   Temp 99.1 F (37.3 C) (Oral)   Resp (!) 21   Ht 5' (1.524 m)   Wt 78 kg   SpO2 95%   BMI 33.59 kg/m   Physical Exam Vitals and nursing note reviewed.  Constitutional:      General: She is not in acute distress.    Appearance: Normal appearance.  HENT:     Head: Normocephalic and atraumatic.  Eyes:     General: No scleral icterus.    Conjunctiva/sclera: Conjunctivae normal.  Cardiovascular:     Rate and Rhythm: Normal rate and regular rhythm.  Pulmonary:     Effort: Tachypnea present. No respiratory distress.     Breath sounds: No stridor.     Comments: Expiratory wheezing throughout all lung fields with good air movement Abdominal:     Palpations: Abdomen is soft.     Tenderness: There is no abdominal tenderness. There is no guarding.  Musculoskeletal:        General: No deformity or signs of injury.     Cervical back: Normal range of motion.     Right lower leg: No edema.     Left lower leg: No edema.  Skin:    General: Skin is dry.     Coloration: Skin is not jaundiced or pale.  Neurological:     General: No focal deficit present.     Mental Status: She is alert and oriented to person, place, and time. Mental status is at baseline.  Psychiatric:        Mood and Affect: Mood normal.         Behavior: Behavior normal.     LABS (all labs ordered are listed, but only abnormal results are displayed)  Labs Reviewed  RESP PANEL BY RT-PCR (  FLU A&B, COVID) ARPGX2 - Abnormal; Notable for the following components:      Result Value   SARS Coronavirus 2 by RT PCR POSITIVE (*)    All other components within normal limits  CBC - Abnormal; Notable for the following components:   Hemoglobin 11.8 (*)    HCT 34.5 (*)    All other components within normal limits  COMPREHENSIVE METABOLIC PANEL - Abnormal; Notable for the following components:   Sodium 131 (*)    Glucose, Bld 122 (*)    Creatinine, Ser 1.10 (*)    AST 43 (*)    GFR, Estimated 51 (*)    All other components within normal limits   ____________________________________________  EKG  Sinus tachycardia, normal axis, normal intervals, poor R wave progression, no acute ischemic changes ____________________________________________  RADIOLOGY Almeta Monas, personally viewed and evaluated these images (plain radiographs) as part of my medical decision making, as well as reviewing the written report by the radiologist.  ED MD interpretation:  I reviewed the CXR which does not show any acute cardiopulmonary process      ____________________________________________   PROCEDURES  Procedure(s) performed (including Critical Care):  Procedures   ____________________________________________   INITIAL IMPRESSION / ASSESSMENT AND PLAN / ED COURSE   Patient is 80 year old female with 1 day of cough shortness of breath and fever.  She is febrile to 102.3 here but is saturating well.  Blood pressure elevated 201/90.  Patient is somewhat tachypneic and does have wheezing throughout with good air movement.  Tells me she does have a history of COPD but has not really ever had to use ablator inhalers because she has not had wheezing.  She denies chest pain or abdominal pain.  Does complain of some bilateral flank pain but  this started after she was sitting in a recliner for several hours when the ED.  No urinary symptoms.  Labs are overall reassuring, renal function is at baseline.  She is notably COVID-positive which likely explains her symptoms.  Her EKG is nonischemic and again she has no chest pain.  There is no infiltrate on chest x-ray.  We will treat for COPD exacerbation with duo nebs and prednisone.  Patient is ready received ibuprofen will give Tylenol for the fever and reassess her respiratory status.  She is not requiring oxygen at this time likely can be discharged.  After duo nebs and steroids patient feels much improved.  She is no longer wheezing.  Unfortunately she not be prescribed Paxil because she is on Plavix.  Will discharge with albuterol inhaler and 5 days of prednisone.  We discussed return precautions.  She is stable for discharge.    ____________________________________________   FINAL CLINICAL IMPRESSION(S) / ED DIAGNOSES  Final diagnoses:  COVID-19  COPD exacerbation ([place])     ED Discharge Orders          Ordered    predniSONE (DELTASONE) 50 MG tablet        09/22/21 2240    albuterol (VENTOLIN HFA) 108 (90 Base) MCG/ACT inhaler  Every 6 hours PRN        09/22/21 2240             Note:  This document was prepared using Dragon voice recognition software and may include unintentional dictation errors.    Rada Hay, MD 09/23/21 [phone]

## 2021-09-22 NOTE — ED Notes (Signed)
Pt ambulatory to restroom at this time. 

## 2021-09-22 NOTE — ED Notes (Signed)
Ems from home Last pm started with sob, but states worse with lying down, and states chest pain that radiated into her neck that gets better when she sits up instead of lying down, pt recent diagnosis of copd and prescribed an inhaler but hasn't used, bilat lower lobe wheezing, sats in high 90's on 6L, pt not normally on O2. Removed from O2 and dropped to 97 on room air. Htn of 202/120 Down to 187/102, 100 pulse Takes blood thinners and beta blockers per ems. 20g left ac

## 2021-09-22 NOTE — ED Notes (Signed)
Pos covid

## 2021-09-22 NOTE — ED Triage Notes (Signed)
Pt arrives via ems from home pt states that she started feeling bad last pm, states last dose of tylenol at 1100. Pt has some wheezing, pt was diagnosed with copd but hasn't used her inhaler because she didn't know how according to ems

## 2021-09-25 ENCOUNTER — Encounter: Payer: Self-pay | Admitting: Internal Medicine

## 2021-09-25 NOTE — Telephone Encounter (Signed)
Pt called in regards to previous message. Pt tested positive for covid and was seen in ED 11/29. Pt is covid positive and wants to know if she can take 1000mg  of vitamin C daily. Pt also needs to be seen for a hospital f/u and wants to know how long she should wait to follow up.

## 2021-09-27 NOTE — Telephone Encounter (Signed)
Follow up can be in 5 days ,

## 2021-09-28 NOTE — Telephone Encounter (Signed)
Spoke with pt and scheduled her for Friday at 4:30, 10/02/2021

## 2021-10-01 ENCOUNTER — Encounter (INDEPENDENT_AMBULATORY_CARE_PROVIDER_SITE_OTHER): Payer: Medicare Other

## 2021-10-01 ENCOUNTER — Ambulatory Visit (INDEPENDENT_AMBULATORY_CARE_PROVIDER_SITE_OTHER): Payer: Medicare Other | Admitting: Vascular Surgery

## 2021-10-02 ENCOUNTER — Ambulatory Visit (INDEPENDENT_AMBULATORY_CARE_PROVIDER_SITE_OTHER): Payer: Medicare Other | Admitting: Internal Medicine

## 2021-10-02 ENCOUNTER — Encounter: Payer: Self-pay | Admitting: Internal Medicine

## 2021-10-02 ENCOUNTER — Other Ambulatory Visit: Payer: Self-pay

## 2021-10-02 DIAGNOSIS — I701 Atherosclerosis of renal artery: Secondary | ICD-10-CM

## 2021-10-02 DIAGNOSIS — U071 COVID-19: Secondary | ICD-10-CM | POA: Diagnosis not present

## 2021-10-02 MED ORDER — HYDROCOD POLST-CPM POLST ER 10-8 MG/5ML PO SUER: 5.0000 mL | Freq: Two times a day (BID) | ORAL | 0 refills | Status: DC | PRN

## 2021-10-02 NOTE — Progress Notes (Signed)
Subjective:  Patient ID: Sabrina [name], female    DOB: [birth year]  Age: 80 y.o. MRN: [id number]  CC: The encounter diagnosis was COVID-19 virus infection.  HPI Sabrina [name] presents for  Chief Complaint  Patient presents with   Follow-up    ED follow up from having covid. Pt states that she is still weak and off balance, has swollen glands under her jaw line and discomfort in her throat when taking a breath sometimes.    This visit occurred during the SARS-CoV-2 public health emergency.  Safety protocols were in place, including screening questions prior to the visit, additional usage of staff PPE, and extensive cleaning of exam room while observing appropriate contact time as indicated for disinfecting solutions.   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  She was evaluated with chest x ray and EKG and ruled out for AMI.  She was treated with albuterol and prednisone taper and presents today for follow up.    She feels somewhat better.  Denies shortness of breath, fevers,  and chest pain . Persistent symptoms of lymphadenopathy and fatigue are mild .    Outpatient Medications Prior to Visit  Medication Sig Dispense Refill   albuterol (VENTOLIN HFA) 108 (90 Base) MCG/ACT inhaler Inhale 2 puffs into the lungs every 6 (six) hours as needed for wheezing or shortness of breath. 8 g 2   aspirin 81 MG tablet Take 81 mg by mouth daily.     butalbital-acetaminophen-caffeine (FIORICET) 50-325-40 MG tablet Take 1 tablet by mouth every 6 (six) hours as needed for headache. 120 tablet 1   chlordiazePOXIDE (LIBRIUM) 25 MG capsule TAKE 1 CAPSULE BY MOUTH ONCE DAILY AS NEEDED FOR ANXIETY 30 capsule 5   clopidogrel (PLAVIX) 75 MG tablet Take 1 tablet (75 mg total) by mouth daily. To prevent strokes 90  tablet 1   ezetimibe (ZETIA) 10 MG tablet Take 1 tablet by mouth once daily 90 tablet 0   hydrALAZINE (APRESOLINE) 50 MG tablet Take 1.5 tablets (75 mg total) by mouth 3 (three) times daily. Marland KitchenNOTE DOSE INCREASE TO 75 MG 405 tablet 1   isosorbide mononitrate (IMDUR) 30 MG 24 hr tablet Take by mouth.     labetalol (NORMODYNE) 200 MG tablet Take 2 tablets (400 mg total) by mouth 2 (two) times daily. For hypertension 120 tablet 1   liothyronine (CYTOMEL) 5 MCG tablet Take 1 tablet (5 mcg total) by mouth daily. For underactive thyroid 90 tablet 1   NARCAN 4 MG/0.1ML LIQD nasal spray kit      nortriptyline (PAMELOR) 10 MG capsule Take 1 capsule (10 mg total) by mouth 2 (two) times daily. For chronic headaches 180 capsule 1   Omega 3 1000 MG CAPS Take 1 capsule by mouth 2 (two) times daily.      pantoprazole (PROTONIX) 40 MG tablet Take 1 tablet (40 mg total) by mouth daily. 30 tablet 5   valsartan (DIOVAN) 320 MG tablet Take 1 tablet (320 mg total) by mouth daily. 90 tablet 0   pantoprazole (PROTONIX) 40 MG tablet Take 1 tablet by mouth once daily (Patient not taking: Reported on 10/02/2021) 90 tablet 0   predniSONE (DELTASONE) 50 MG tablet Take 1 $Remo'50mg'SxuGt$  pill daily for the next  5 days (Patient not taking: Reported on 10/02/2021) 5 tablet 0   No facility-administered medications prior to visit.    Review of Systems;  Patient denies headache, fevers, malaise, unintentional weight loss, skin rash, eye pain, sinus congestion and sinus pain, sore throat, dysphagia,  hemoptysis , cough, dyspnea, wheezing, chest pain, palpitations, orthopnea, edema, abdominal pain, nausea, melena, diarrhea, constipation, flank pain, dysuria, hematuria, urinary  Frequency, nocturia, numbness, tingling, seizures,  Focal weakness, Loss of consciousness,  Tremor, insomnia, depression, anxiety, and suicidal ideation.      Objective:  BP (!) 150/70 (BP Location: Right Arm, Patient Position: Sitting, Cuff Size: Normal)   Pulse 76    Temp (!) 96.2 F (35.7 C) (Temporal)   Ht 5' (1.524 m)   Wt 164 lb 12.8 oz (74.8 kg)   SpO2 99%   BMI 32.19 kg/m   BP Readings from Last 3 Encounters:  10/02/21 (!) 150/70  09/22/21 (!) 155/80  09/07/21 (!) 142/88    Wt Readings from Last 3 Encounters:  10/02/21 164 lb 12.8 oz (74.8 kg)  09/22/21 172 lb (78 kg)  09/07/21 168 lb 6.4 oz (76.4 kg)    General appearance: alert, cooperative and appears stated age Ears: normal TM's and external ear canals both ears Throat: lips, mucosa, and tongue normal; teeth and gums normal Neck: no adenopathy, no carotid bruit, supple, symmetrical, trachea midline and thyroid not enlarged, symmetric, no tenderness/mass/nodules Back: symmetric, no curvature. ROM normal. No CVA tenderness. Lungs: clear to auscultation bilaterally Heart: regular rate and rhythm, S1, S2 normal, no murmur, click, rub or gallop Abdomen: soft, non-tender; bowel sounds normal; no masses,  no organomegaly Pulses: 2+ and symmetric Skin: Skin color, texture, turgor normal. No rashes or lesions Lymph nodes: Cervical, supraclavicular, and axillary nodes normal.  Lab Results  Component Value Date   HGBA1C 5.9 07/19/2016   HGBA1C 5.8 06/02/2015    Lab Results  Component Value Date   CREATININE 1.10 (H) 09/22/2021   CREATININE 1.29 (H) 06/22/2021   CREATININE 1.24 (H) 06/13/2021    Lab Results  Component Value Date   WBC 10.2 09/22/2021   HGB 11.8 (L) 09/22/2021   HCT 34.5 (L) 09/22/2021   PLT 215 09/22/2021   GLUCOSE 122 (H) 09/22/2021   CHOL 205 (H) 07/29/2020   TRIG 131.0 07/29/2020   HDL 49.10 07/29/2020   LDLDIRECT 109.0 03/05/2016   LDLCALC 130 (H) 07/29/2020   ALT 38 09/22/2021   AST 43 (H) 09/22/2021   NA 131 (L) 09/22/2021   K 3.6 09/22/2021   CL 98 09/22/2021   CREATININE 1.10 (H) 09/22/2021   BUN 17 09/22/2021   CO2 25 09/22/2021   TSH 7.67 (H) 06/22/2021   INR 1.0 03/27/2014   HGBA1C 5.9 07/19/2016    DG Chest 2 View  Result Date:  09/22/2021 CLINICAL DATA:  Fever and wheezing.  History of COPD. EXAM: CHEST - 2 VIEW COMPARISON:  Radiographs 06/12/2021 and 11/26/2015. 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 There is aortic and coronary artery atherosclerosis. The lungs appear clear. There is no pleural effusion or pneumothorax. No acute osseous findings are evident. Right-sided cervical rib again noted. IMPRESSION: Stable postoperative chest. No evidence of active pulmonary process. Electronically Signed   By: Richardean Sale M.D.   On: 09/22/2021 14:34    Assessment & Plan:   Problem List Items Addressed This Visit     COVID-19 virus infection    Symptoms are improving  and she is not wheezing on exam. Prescribing a cough suppressant  With hydrocodone for persistent cough with sore throat       I provided  30 minutes  during this encounter reviewing patient's current problems and recent ER visit.  Including EKG,  labs and imaging studies, providing counseling on the above mentioned problems I n a face to face visit  , and coordination  of care .  Meds ordered this encounter  Medications   chlorpheniramine-HYDROcodone (TUSSIONEX PENNKINETIC ER) 10-8 MG/5ML SUER    Sig: Take 5 mLs by mouth every 12 (twelve) hours as needed.    Dispense:  140 mL    Refill:  0     I provided  30 minutes of  face-to-face time during this encounter reviewing patient's current problems and past surgeries, labs and imaging studies, providing counseling on the above mentioned problems , and coordination  of care .   Follow-up: No follow-ups on file.   Crecencio Mc, MD

## 2021-10-02 NOTE — Patient Instructions (Signed)
Stop your thyroid medication.   You do not need to take it anymore if it is raising your blood pressure    I am prescribing a strong cough medication called "Tussionex".  It will treat your throat pain as well

## 2021-10-04 ENCOUNTER — Encounter: Payer: Self-pay | Admitting: Internal Medicine

## 2021-10-04 DIAGNOSIS — U071 COVID-19: Secondary | ICD-10-CM | POA: Insufficient documentation

## 2021-10-04 NOTE — Assessment & Plan Note (Signed)
Symptoms are improving and she is not wheezing on exam. Prescribing a cough suppressant  With hydrocodone for persistent cough with sore throat

## 2021-10-05 ENCOUNTER — Other Ambulatory Visit: Payer: Self-pay

## 2021-10-05 MED ORDER — PANTOPRAZOLE SODIUM 40 MG PO TBEC: 40.0000 mg | DELAYED_RELEASE_TABLET | Freq: Every day | ORAL | 5 refills | Status: DC

## 2021-10-05 MED ORDER — CLOPIDOGREL BISULFATE 75 MG PO TABS: 75.0000 mg | ORAL_TABLET | Freq: Every day | ORAL | 1 refills | Status: DC

## 2021-10-07 ENCOUNTER — Encounter: Payer: Self-pay | Admitting: Internal Medicine

## 2021-11-02 ENCOUNTER — Encounter: Payer: Self-pay | Admitting: Internal Medicine

## 2021-11-03 ENCOUNTER — Other Ambulatory Visit: Payer: Self-pay

## 2021-11-03 MED ORDER — EZETIMIBE 10 MG PO TABS: 10.0000 mg | ORAL_TABLET | Freq: Every day | ORAL | 1 refills | Status: DC

## 2021-11-04 DIAGNOSIS — R011 Cardiac murmur, unspecified: Secondary | ICD-10-CM | POA: Diagnosis not present

## 2021-11-04 DIAGNOSIS — I350 Nonrheumatic aortic (valve) stenosis: Secondary | ICD-10-CM | POA: Diagnosis not present

## 2021-11-04 DIAGNOSIS — I739 Peripheral vascular disease, unspecified: Secondary | ICD-10-CM | POA: Diagnosis not present

## 2021-11-04 DIAGNOSIS — I2510 Atherosclerotic heart disease of native coronary artery without angina pectoris: Secondary | ICD-10-CM | POA: Diagnosis not present

## 2021-11-04 DIAGNOSIS — I25118 Atherosclerotic heart disease of native coronary artery with other forms of angina pectoris: Secondary | ICD-10-CM | POA: Diagnosis not present

## 2021-11-04 DIAGNOSIS — I10 Essential (primary) hypertension: Secondary | ICD-10-CM | POA: Diagnosis not present

## 2021-11-04 DIAGNOSIS — I25720 Atherosclerosis of autologous artery coronary artery bypass graft(s) with unstable angina pectoris: Secondary | ICD-10-CM | POA: Diagnosis not present

## 2021-11-04 DIAGNOSIS — I208 Other forms of angina pectoris: Secondary | ICD-10-CM | POA: Diagnosis not present

## 2021-11-04 DIAGNOSIS — I359 Nonrheumatic aortic valve disorder, unspecified: Secondary | ICD-10-CM | POA: Diagnosis not present

## 2021-11-04 DIAGNOSIS — K219 Gastro-esophageal reflux disease without esophagitis: Secondary | ICD-10-CM | POA: Diagnosis not present

## 2021-11-04 DIAGNOSIS — Z79899 Other long term (current) drug therapy: Secondary | ICD-10-CM | POA: Diagnosis not present

## 2021-11-04 DIAGNOSIS — R0602 Shortness of breath: Secondary | ICD-10-CM | POA: Diagnosis not present

## 2021-11-10 ENCOUNTER — Other Ambulatory Visit
Admission: RE | Admit: 2021-11-10 | Discharge: 2021-11-10 | Disposition: A | Discharge status: Home / Self Care | Payer: Medicare Other | Source: Ambulatory Visit | Attending: Internal Medicine | Admitting: Internal Medicine

## 2021-11-10 ENCOUNTER — Encounter: Payer: Self-pay | Admitting: Internal Medicine

## 2021-11-10 DIAGNOSIS — I350 Nonrheumatic aortic (valve) stenosis: Secondary | ICD-10-CM | POA: Diagnosis not present

## 2021-11-10 DIAGNOSIS — Z79899 Other long term (current) drug therapy: Secondary | ICD-10-CM | POA: Diagnosis not present

## 2021-11-10 DIAGNOSIS — I10 Essential (primary) hypertension: Secondary | ICD-10-CM | POA: Diagnosis not present

## 2021-11-10 DIAGNOSIS — R0602 Shortness of breath: Secondary | ICD-10-CM | POA: Diagnosis not present

## 2021-11-10 DIAGNOSIS — I359 Nonrheumatic aortic valve disorder, unspecified: Secondary | ICD-10-CM | POA: Diagnosis not present

## 2021-11-10 DIAGNOSIS — R3581 Nocturnal polyuria: Secondary | ICD-10-CM | POA: Diagnosis not present

## 2021-11-10 DIAGNOSIS — I208 Other forms of angina pectoris: Secondary | ICD-10-CM | POA: Diagnosis not present

## 2021-11-10 DIAGNOSIS — I2510 Atherosclerotic heart disease of native coronary artery without angina pectoris: Secondary | ICD-10-CM | POA: Diagnosis not present

## 2021-11-10 DIAGNOSIS — I25118 Atherosclerotic heart disease of native coronary artery with other forms of angina pectoris: Secondary | ICD-10-CM | POA: Insufficient documentation

## 2021-11-10 LAB — BRAIN NATRIURETIC PEPTIDE: B Natriuretic Peptide: 261 pg/mL — ABNORMAL HIGH (ref 0.0–100.0)

## 2021-11-16 DIAGNOSIS — I208 Other forms of angina pectoris: Secondary | ICD-10-CM | POA: Diagnosis not present

## 2021-11-16 DIAGNOSIS — I050 Rheumatic mitral stenosis: Secondary | ICD-10-CM

## 2021-11-16 DIAGNOSIS — R0602 Shortness of breath: Secondary | ICD-10-CM | POA: Diagnosis not present

## 2021-11-16 HISTORY — DX: Rheumatic mitral stenosis: I05.0

## 2021-11-17 ENCOUNTER — Encounter: Payer: Self-pay | Admitting: Internal Medicine

## 2021-11-18 ENCOUNTER — Other Ambulatory Visit: Payer: Self-pay

## 2021-11-18 ENCOUNTER — Ambulatory Visit
Admission: RE | Admit: 2021-11-18 | Discharge: 2021-11-18 | Disposition: A | Discharge status: Home / Self Care | Payer: Medicare Other | Source: Ambulatory Visit | Attending: Internal Medicine | Admitting: Internal Medicine

## 2021-11-18 ENCOUNTER — Encounter: Payer: Self-pay | Admitting: Internal Medicine

## 2021-11-18 ENCOUNTER — Ambulatory Visit: Payer: Medicare Other | Admitting: Dermatology

## 2021-11-18 ENCOUNTER — Other Ambulatory Visit: Payer: Self-pay | Admitting: Internal Medicine

## 2021-11-18 ENCOUNTER — Encounter
Admission: RE | Disposition: A | Discharge status: Home / Self Care | Payer: Self-pay | Source: Ambulatory Visit | Attending: Internal Medicine

## 2021-11-18 DIAGNOSIS — I2582 Chronic total occlusion of coronary artery: Secondary | ICD-10-CM | POA: Diagnosis not present

## 2021-11-18 DIAGNOSIS — I25700 Atherosclerosis of coronary artery bypass graft(s), unspecified, with unstable angina pectoris: Secondary | ICD-10-CM | POA: Insufficient documentation

## 2021-11-18 DIAGNOSIS — I25119 Atherosclerotic heart disease of native coronary artery with unspecified angina pectoris: Secondary | ICD-10-CM | POA: Diagnosis not present

## 2021-11-18 DIAGNOSIS — I209 Angina pectoris, unspecified: Secondary | ICD-10-CM | POA: Diagnosis not present

## 2021-11-18 DIAGNOSIS — I200 Unstable angina: Secondary | ICD-10-CM

## 2021-11-18 DIAGNOSIS — R0602 Shortness of breath: Secondary | ICD-10-CM | POA: Insufficient documentation

## 2021-11-18 HISTORY — PX: LEFT HEART CATH AND CORONARY ANGIOGRAPHY: CATH118249

## 2021-11-18 SURGERY — LEFT HEART CATH AND CORONARY ANGIOGRAPHY
Anesthesia: Moderate Sedation

## 2021-11-18 MED ORDER — SODIUM CHLORIDE 0.9 % WEIGHT BASED INFUSION
3.0000 mL/kg/h | INTRAVENOUS | Status: AC
  Administered 2021-11-18: 11:00:00 3 mL/kg/h via INTRAVENOUS

## 2021-11-18 MED ORDER — ACETAMINOPHEN 325 MG PO TABS: 650.0000 mg | ORAL_TABLET | ORAL | Status: DC | PRN

## 2021-11-18 MED ORDER — FENTANYL CITRATE (PF) 100 MCG/2ML IJ SOLN
INTRAMUSCULAR | Status: AC
  Filled 2021-11-18: qty 2

## 2021-11-18 MED ORDER — SODIUM CHLORIDE 0.9% FLUSH: 3.0000 mL | Freq: Two times a day (BID) | INTRAVENOUS | Status: DC

## 2021-11-18 MED ORDER — ASPIRIN 81 MG PO CHEW: 81.0000 mg | CHEWABLE_TABLET | ORAL | Status: DC

## 2021-11-18 MED ORDER — LIDOCAINE HCL 1 % IJ SOLN
INTRAMUSCULAR | Status: AC
  Filled 2021-11-18: qty 20

## 2021-11-18 MED ORDER — ACETAMINOPHEN 500 MG PO TABS
1000.0000 mg | ORAL_TABLET | Freq: Four times a day (QID) | ORAL | Status: DC | PRN
  Administered 2021-11-18: 13:00:00 1000 mg via ORAL

## 2021-11-18 MED ORDER — SODIUM CHLORIDE 0.9 % WEIGHT BASED INFUSION: 1.0000 mL/kg/h | INTRAVENOUS | Status: DC

## 2021-11-18 MED ORDER — LIDOCAINE HCL (PF) 1 % IJ SOLN
INTRAMUSCULAR | Status: DC | PRN
  Administered 2021-11-18: 20 mL

## 2021-11-18 MED ORDER — SODIUM CHLORIDE 0.9 % IV SOLN: 250.0000 mL | INTRAVENOUS | Status: DC | PRN

## 2021-11-18 MED ORDER — SODIUM CHLORIDE 0.9% FLUSH: 3.0000 mL | INTRAVENOUS | Status: DC | PRN

## 2021-11-18 MED ORDER — FENTANYL CITRATE (PF) 100 MCG/2ML IJ SOLN
INTRAMUSCULAR | Status: DC | PRN
  Administered 2021-11-18 (×2): 25 ug via INTRAVENOUS

## 2021-11-18 MED ORDER — HEPARIN (PORCINE) IN NACL 1000-0.9 UT/500ML-% IV SOLN
INTRAVENOUS | Status: DC | PRN
  Administered 2021-11-18: 1000 mL

## 2021-11-18 MED ORDER — HYDRALAZINE HCL 20 MG/ML IJ SOLN
INTRAMUSCULAR | Status: DC | PRN
  Administered 2021-11-18: 20 mg via INTRAVENOUS

## 2021-11-18 MED ORDER — MIDAZOLAM HCL 2 MG/2ML IJ SOLN
INTRAMUSCULAR | Status: DC | PRN
  Administered 2021-11-18 (×2): 1 mg via INTRAVENOUS

## 2021-11-18 MED ORDER — BUTALBITAL-APAP-CAFFEINE 50-325-40 MG PO TABS: 1.0000 | ORAL_TABLET | Freq: Four times a day (QID) | ORAL | 1 refills | Status: DC | PRN

## 2021-11-18 MED ORDER — ACETAMINOPHEN 500 MG PO TABS
ORAL_TABLET | ORAL | Status: AC
  Filled 2021-11-18: qty 2

## 2021-11-18 MED ORDER — LABETALOL HCL 5 MG/ML IV SOLN: 20.0000 mg | Freq: Once | INTRAVENOUS | Status: AC

## 2021-11-18 MED ORDER — MIDAZOLAM HCL 2 MG/2ML IJ SOLN
INTRAMUSCULAR | Status: AC
  Filled 2021-11-18: qty 2

## 2021-11-18 MED ORDER — LABETALOL HCL 5 MG/ML IV SOLN: 10.0000 mg | INTRAVENOUS | Status: DC | PRN

## 2021-11-18 MED ORDER — HYDRALAZINE HCL 20 MG/ML IJ SOLN
INTRAMUSCULAR | Status: AC
  Filled 2021-11-18: qty 1

## 2021-11-18 MED ORDER — ONDANSETRON HCL 4 MG/2ML IJ SOLN: 4.0000 mg | Freq: Four times a day (QID) | INTRAMUSCULAR | Status: DC | PRN

## 2021-11-18 MED ORDER — ASPIRIN 81 MG PO CHEW
CHEWABLE_TABLET | ORAL | Status: AC
  Filled 2021-11-18: qty 1

## 2021-11-18 MED ORDER — IOHEXOL 300 MG/ML  SOLN
INTRAMUSCULAR | Status: DC | PRN
  Administered 2021-11-18: 13:00:00 129 mL

## 2021-11-18 MED ORDER — LABETALOL HCL 5 MG/ML IV SOLN
INTRAVENOUS | Status: AC
  Administered 2021-11-18: 11:00:00 20 mg via INTRAVENOUS
  Filled 2021-11-18: qty 4

## 2021-11-18 MED ORDER — HYDRALAZINE HCL 20 MG/ML IJ SOLN: 10.0000 mg | INTRAMUSCULAR | Status: DC | PRN

## 2021-11-18 MED ORDER — HEPARIN (PORCINE) IN NACL 1000-0.9 UT/500ML-% IV SOLN
INTRAVENOUS | Status: AC
  Filled 2021-11-18: qty 1000

## 2021-11-18 SURGICAL SUPPLY — 12 items
CATH INFINITI 5 FR MPA2 (CATHETERS) ×1 IMPLANT
CATH INFINITI 5FR MULTPACK ANG (CATHETERS) ×1 IMPLANT
DEVICE CLOSURE MYNXGRIP 5F (Vascular Products) ×1 IMPLANT
NDL PERC 18GX7CM (NEEDLE) IMPLANT
NEEDLE PERC 18GX7CM (NEEDLE) ×2 IMPLANT
PACK CARDIAC CATH (CUSTOM PROCEDURE TRAY) ×3 IMPLANT
PROTECTION STATION PRESSURIZED (MISCELLANEOUS) ×2
SET ATX SIMPLICITY (MISCELLANEOUS) ×1 IMPLANT
SHEATH AVANTI 5FR X 11CM (SHEATH) ×1 IMPLANT
STATION PROTECTION PRESSURIZED (MISCELLANEOUS) IMPLANT
WIRE GUIDERIGHT .035X150 (WIRE) ×2 IMPLANT
WIRE HITORQ VERSACORE ST 145CM (WIRE) ×1 IMPLANT

## 2021-11-18 NOTE — OR Nursing (Signed)
[facility] notified regarding elevated BP, anxiety and difficulty getting second IV. Labetelol iv ordered

## 2021-11-18 NOTE — Telephone Encounter (Signed)
Refilled: 08/04/2021 Last OV: 09/07/2021 Next OV: not scheduled

## 2021-11-19 ENCOUNTER — Encounter: Payer: Self-pay | Admitting: Internal Medicine

## 2021-11-19 ENCOUNTER — Telehealth (INDEPENDENT_AMBULATORY_CARE_PROVIDER_SITE_OTHER): Payer: Self-pay

## 2021-11-19 LAB — CARDIAC CATHETERIZATION: Cath EF Quantitative: 60 %

## 2021-11-19 NOTE — Telephone Encounter (Signed)
Patient is somewhat confused about her procedure on 11/23/21 with Dr. Lucky Cowboy for a subclavian stent placement and wants to discuss it. Per Arna Medici ask the patient if she would come in to see Dr. Lucky Cowboy on 11/20/21 to discuss the procedure. Thank you

## 2021-11-19 NOTE — Telephone Encounter (Signed)
Spoke with the patient and she is scheduled with Dr. Lucky Cowboy for a left subclavian stent placement on 11/23/21 with a 12:45 pm arrival time to the [facility]. Pre-procedure instructions were discussed and will be mailed.

## 2021-11-20 ENCOUNTER — Encounter (INDEPENDENT_AMBULATORY_CARE_PROVIDER_SITE_OTHER): Payer: Self-pay | Admitting: Vascular Surgery

## 2021-11-20 ENCOUNTER — Other Ambulatory Visit: Payer: Self-pay

## 2021-11-20 ENCOUNTER — Ambulatory Visit (INDEPENDENT_AMBULATORY_CARE_PROVIDER_SITE_OTHER): Payer: Medicare Other | Admitting: Vascular Surgery

## 2021-11-20 VITALS — BP 198/71 | HR 85 | Resp 16 | Wt 161.8 lb

## 2021-11-20 DIAGNOSIS — I771 Stricture of artery: Secondary | ICD-10-CM

## 2021-11-20 DIAGNOSIS — I10 Essential (primary) hypertension: Secondary | ICD-10-CM | POA: Diagnosis not present

## 2021-11-20 DIAGNOSIS — I130 Hypertensive heart and chronic kidney disease with heart failure and stage 1 through stage 4 chronic kidney disease, or unspecified chronic kidney disease: Secondary | ICD-10-CM | POA: Diagnosis not present

## 2021-11-20 DIAGNOSIS — N183 Chronic kidney disease, stage 3 unspecified: Secondary | ICD-10-CM

## 2021-11-20 DIAGNOSIS — I200 Unstable angina: Secondary | ICD-10-CM

## 2021-11-20 DIAGNOSIS — E782 Mixed hyperlipidemia: Secondary | ICD-10-CM | POA: Diagnosis not present

## 2021-11-20 NOTE — Assessment & Plan Note (Signed)
The patient appears to have a high-grade left subclavian artery stenosis proximal to her left internal mammary coronary bypass graft.  This is a critical situation as this is the primary perfusion to her heart and she does have anginal symptoms.  She needs left subclavian artery intervention.  I discussed the risk and benefits of the procedure.  The patient voices her understanding and is agreeable to proceed and this has been scheduled for next week.

## 2021-11-20 NOTE — Assessment & Plan Note (Addendum)
lipid control important in reducing the progression of atherosclerotic disease. Allergic to statins

## 2021-11-20 NOTE — Progress Notes (Signed)
Patient ID: Sabrina [name], female   DOB: 1940/11/09, 81 y.o.   MRN: [id number]  Chief Complaint  Patient presents with   Follow-up    Discuss procedure    HPI Sabrina [name] is a 81 y.o. female.  I am asked to see the patient by Dr. 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  The patient describes significant anginal symptoms with activity.  She had a recent cardiac catheterization showing diffuse disease and also at which time there appeared to be a significant left subclavian artery stenosis.  He was unable to cannulate the left internal mammary artery itself secondary to this left subclavian stenosis.  The patient has had a significant vascular history with peripheral arterial disease treated as well.  She does not typically have chest pain at rest but does with activity.     Past Medical History:  Diagnosis Date   Amputation of fifth toe, right, traumatic, subsequent encounter ([place]) 06/18/2019   Anemia of chronic kidney failure    Anxiety    CAD (coronary artery disease)    Carotid artery stenosis    Chronic kidney disease, stage III (moderate) (HCC)    Followed by Dr. Juleen China   Hyperlipidemia    Hypertension    Renal artery stenosis ([place])    Secondary hyperparathyroidism ([place])    Spinal stenosis of lumbar region at multiple levels    Subclavian arterial stenosis ([place])     Past Surgical History:  Procedure Laterality Date   ABDOMINAL HYSTERECTOMY  1976   CAROTID ARTERY ANGIOPLASTY Left    CAROTID ENDARTERECTOMY Left    CHOLECYSTECTOMY     COLONOSCOPY WITH PROPOFOL N/A 08/16/2017   Procedure: COLONOSCOPY WITH PROPOFOL;  Surgeon: [name], MD;  Location: [facility];  Service: Endoscopy;  Laterality: N/A;   CORONARY ANGIOPLASTY WITH STENT PLACEMENT  2000   CORONARY ARTERY BYPASS GRAFT  2000   CYSTOSCOPY WITH STENT PLACEMENT Bilateral    ESOPHAGOGASTRODUODENOSCOPY  (EGD) WITH PROPOFOL N/A 08/16/2017   Procedure: ESOPHAGOGASTRODUODENOSCOPY (EGD) WITH PROPOFOL;  Surgeon: [name], MD;  Location: [facility];  Service: Endoscopy;  Laterality: N/A;   ESOPHAGOGASTRODUODENOSCOPY (EGD) WITH PROPOFOL N/A 06/29/2018   Procedure: ESOPHAGOGASTRODUODENOSCOPY (EGD) WITH PROPOFOL;  Surgeon: Virgel Manifold, MD;  Location: [facility];  Service: Endoscopy;  Laterality: N/A;   JOINT REPLACEMENT Bilateral 2000   LEFT HEART CATH AND CORONARY ANGIOGRAPHY N/A 11/18/2021   Procedure: LEFT HEART CATH AND CORONARY ANGIOGRAPHY;  Surgeon: Yolonda Kida, MD;  Location: [place] [facility];  Service: Cardiovascular;  Laterality: N/A;   RENAL ARTERY ANGIOPLASTY Bilateral mARCH 2015   TOE AMPUTATION Right    small toe   TONSILLECTOMY AND ADENOIDECTOMY     TOTAL HIP ARTHROPLASTY Left    TOTAL HIP ARTHROPLASTY Right 2015     Family History  Problem Relation Age of Onset   Stroke Mother    Hypertension Mother    Diabetes Mother    Hypertension Father    Heart disease Sister        MI   Multiple sclerosis Daughter    Multiple sclerosis Son    Cerebral aneurysm Son    Seizures Son    Cerebral aneurysm Son    Breast cancer Paternal Aunt 5     Social History   Tobacco Use   Smoking status: Former    Types: Cigarettes    Quit date: 10/25/1998  Years since quitting: 23.0   Smokeless tobacco: Never  Vaping Use   Vaping Use: Never used  Substance Use Topics   Alcohol use: No    Alcohol/week: 0.0 standard drinks   Drug use: Never     Allergies  Allergen Reactions   Citalopram     Throat closing    Dilaudid [Hydromorphone Hcl] Nausea And Vomiting   Hydrochlorothiazide Other (See Comments)    Decreased GFR (Nov 2015)   Nsaids     CKD stage III - avoid nephrotoxic drugs   Nubain [Nalbuphine Hcl]     Burning sensation in back   Penicillins Itching   Prasugrel Itching   Statins Itching    Current Outpatient Medications  Medication Sig  Dispense Refill   albuterol (VENTOLIN HFA) 108 (90 Base) MCG/ACT inhaler Inhale 2 puffs into the lungs every 6 (six) hours as needed for wheezing or shortness of breath. 8 g 2   aspirin 81 MG tablet Take 81 mg by mouth daily.     butalbital-acetaminophen-caffeine (FIORICET) 50-325-40 MG tablet Take 1 tablet by mouth every 6 (six) hours as needed for headache. 120 tablet 1   chlordiazePOXIDE (LIBRIUM) 25 MG capsule TAKE 1 CAPSULE BY MOUTH ONCE DAILY AS NEEDED FOR ANXIETY 30 capsule 5   chlorpheniramine-HYDROcodone (TUSSIONEX PENNKINETIC ER) 10-8 MG/5ML SUER Take 5 mLs by mouth every 12 (twelve) hours as needed. 140 mL 0   clopidogrel (PLAVIX) 75 MG tablet Take 1 tablet (75 mg total) by mouth daily. To prevent strokes 90 tablet 1   ezetimibe (ZETIA) 10 MG tablet Take 1 tablet (10 mg total) by mouth daily. 90 tablet 1   hydrALAZINE (APRESOLINE) 50 MG tablet Take 1.5 tablets (75 mg total) by mouth 3 (three) times daily. Marland KitchenNOTE DOSE INCREASE TO 75 MG (Patient taking differently: Take 75 mg by mouth 3 (three) times daily. Marland KitchenNOTE DOSE INCREASE TO 75 MG pt reports prn for sbp greater than 150) 405 tablet 1   isosorbide mononitrate (IMDUR) 30 MG 24 hr tablet Take 30 mg by mouth daily.     labetalol (NORMODYNE) 200 MG tablet Take 2 tablets (400 mg total) by mouth 2 (two) times daily. For hypertension 120 tablet 1   nortriptyline (PAMELOR) 10 MG capsule Take 1 capsule (10 mg total) by mouth 2 (two) times daily. For chronic headaches 180 capsule 1   Omega 3 1000 MG CAPS Take 1,000 mg by mouth 2 (two) times daily.     pantoprazole (PROTONIX) 40 MG tablet Take 1 tablet (40 mg total) by mouth daily. 30 tablet 5   ranolazine (RANEXA) 500 MG 12 hr tablet Take 500 mg by mouth 2 (two) times daily.     valsartan (DIOVAN) 320 MG tablet Take 1 tablet (320 mg total) by mouth daily. 90 tablet 0   liothyronine (CYTOMEL) 5 MCG tablet Take 1 tablet (5 mcg total) by mouth daily. For underactive thyroid (Patient not taking:  Reported on 11/11/2021) 90 tablet 1   No current facility-administered medications for this visit.      REVIEW OF SYSTEMS (Negative unless checked)  Constitutional: [] Weight loss  [] Fever  [] Chills Cardiac: [x] Chest pain   [] Chest pressure   [] Palpitations   [] Shortness of breath when laying flat   [] Shortness of breath at rest   [x] Shortness of breath with exertion. Vascular:  [x] Pain in legs with walking   [] Pain in legs at rest   [] Pain in legs when laying flat   [x] Claudication   [] Pain in feet when walking  []   Pain in feet at rest  [] Pain in feet when laying flat   [] History of DVT   [] Phlebitis   [] Swelling in legs   [] Varicose veins   [] Non-healing ulcers Pulmonary:   [] Uses home oxygen   [] Productive cough   [] Hemoptysis   [] Wheeze  [] COPD   [] Asthma Neurologic:  [] Dizziness  [] Blackouts   [] Seizures   [] History of stroke   [] History of TIA  [] Aphasia   [] Temporary blindness   [] Dysphagia   [] Weakness or numbness in arms   [] Weakness or numbness in legs Musculoskeletal:  [x] Arthritis   [] Joint swelling   [x] Joint pain   [] Low back pain Hematologic:  [] Easy bruising  [] Easy bleeding   [] Hypercoagulable state   [x] Anemic  [] Hepatitis Gastrointestinal:  [] Blood in stool   [] Vomiting blood  [x] Gastroesophageal reflux/heartburn   [] Abdominal pain Genitourinary:  [x] Chronic kidney disease   [] Difficult urination  [] Frequent urination  [] Burning with urination   [] Hematuria Skin:  [] Rashes   [] Ulcers   [] Wounds Psychological:  [x] History of anxiety   []  History of major depression.    Physical Exam BP (!) 198/71 (BP Location: Right Arm)    Pulse 85    Resp 16    Wt 161 lb 12.8 oz (73.4 kg)    BMI 30.57 kg/m  Gen:  WD/WN, NAD Head: [place]/AT, No temporalis wasting. Ear/Nose/Throat: Hearing grossly intact, nares w/o erythema or drainage, oropharynx w/o Erythema/Exudate Eyes: Conjunctiva clear, sclera non-icteric  Neck: trachea midline.  No JVD.  Pulmonary:  Good air movement, respirations not  labored, no use of accessory muscles  Cardiac: RRR, no JVD Vascular:  Vessel Right Left  Radial 2+ palpable 1+ palpable                                   Gastrointestinal:. No masses, surgical incisions, or scars. Musculoskeletal: M/S 5/5 throughout.  Extremities without ischemic changes.  No deformity or atrophy.  No edema. Neurologic: Sensation grossly intact in extremities.  Symmetrical.  Speech is fluent. Motor exam as listed above. Psychiatric: Judgment intact, Mood & affect appropriate for pt's clinical situation. Dermatologic: No rashes or ulcers noted.  No cellulitis or open wounds.    Radiology CARDIAC CATHETERIZATION  Result Date: 11/19/2021   Prox RCA-1 lesion is 95% stenosed.   Prox RCA-2 lesion is 95% stenosed.   Prox RCA to Mid RCA lesion is 100% stenosed.   Dist LM to Ost LAD lesion is 75% stenosed.   Ost LAD lesion is 50% stenosed.   Ost LAD to Prox LAD lesion is 50% stenosed.   Mid LAD lesion is 95% stenosed.   Ost Cx to Prox Cx lesion is 80% stenosed.   Mid Cx lesion is 90% stenosed.   Dist Cx lesion is 80% stenosed with 80% stenosed side branch in 3rd Mrg.   Origin to Prox Graft lesion is 100% stenosed.   Dist Graft to Insertion lesion is 90% stenosed.   Prox Graft-1 lesion is 50% stenosed.   Prox Graft-2 lesion is 50% stenosed.   The left ventricular systolic function is normal.   LV end diastolic pressure is normal.   The left ventricular ejection fraction is 55-65% by visual estimate.   Nonselective injection for LIMA appeared to be patent but opacification is suboptimal   Unable to access ostium of LIMA because of severe disease in the subclavian   Tandem severe stenosis and subclavian of 90% and 75% Conclusion  Preserved left ventricular function EF of 60% Coronary Left main large free of disease LAD large proximal 50 to 75% ectatic LAD Circumflex diffuse disease 80 proximal 90 mid diffuse 80 distal OM1 occluded grafted CTO RCA large 100% occluded CTO Grafts LIMA to  mid LAD nonselective injection appears to be patent suboptimal images but unable to access the ostium because of severe subclavian disease SVG to RCA 100% occluded CTO SVG to OM with 50% proximal 90% long diffuse disease and the distal portion All vessels had TIMI-3 flow unless otherwise occluded Will refer patient to vascular for treatment of subclavian as 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 Recommend medical therapy for coronary artery disease Consider reevaluating LIMA once subclavian's treated or by left radial approach.    Labs Recent Results (from the past 2160 hour(s))  CBC     Status: Abnormal   Collection Time: 09/22/21  1:39 PM  Result Value Ref Range   WBC 10.2 4.0 - 10.5 K/uL   RBC 3.96 3.87 - 5.11 MIL/uL   Hemoglobin 11.8 (L) 12.0 - 15.0 g/dL   HCT 34.5 (L) 36.0 - 46.0 %   MCV 87.1 80.0 - 100.0 fL   MCH 29.8 26.0 - 34.0 pg   MCHC 34.2 30.0 - 36.0 g/dL   RDW 13.5 11.5 - 15.5 %   Platelets 215 150 - 400 K/uL   nRBC 0.0 0.0 - 0.2 %    Comment: Performed at [facility], [address] [place], [place] [address]  Comprehensive metabolic panel     Status: Abnormal   Collection Time: 09/22/21  1:39 PM  Result Value Ref Range   Sodium 131 (L) 135 - 145 mmol/L   Potassium 3.6 3.5 - 5.1 mmol/L   Chloride 98 98 - 111 mmol/L   CO2 25 22 - 32 mmol/L   Glucose, Bld 122 (H) 70 - 99 mg/dL    Comment: Glucose reference range applies only to samples taken after fasting for at least 8 hours.   BUN 17 8 - 23 mg/dL   Creatinine, Ser 1.10 (H) 0.44 - 1.00 mg/dL   Calcium 9.3 8.9 - 10.3 mg/dL   Total Protein 7.8 6.5 - 8.1 g/dL   Albumin 3.9 3.5 - 5.0 g/dL   AST 43 (H) 15 - 41 U/L   ALT 38 0 - 44 U/L   Alkaline Phosphatase 76 38 - 126 U/L   Total Bilirubin 0.6 0.3 - 1.2 mg/dL   GFR, Estimated 51 (L) >60 mL/min    Comment: (NOTE) Calculated using the CKD-EPI Creatinine Equation (2021)    Anion gap 8 5 - 15    Comment: Performed at [facility], [address] [place], [place] [address]  Resp Panel by RT-PCR (Flu A&B, Covid) Nasopharyngeal Swab     Status: Abnormal   Collection Time: 09/22/21  1:39 PM   Specimen: Nasopharyngeal Swab; Nasopharyngeal(NP) swabs in vial transport medium  Result Value Ref Range   SARS Coronavirus 2 by RT PCR POSITIVE (A) NEGATIVE    Comment: RESULT CALLED TO, READ BACK BY AND VERIFIED WITH: BRANDY DAVIS AT 1510 ON 09/22/21 BY SS (NOTE) SARS-CoV-2 target nucleic acids are DETECTED.  The SARS-CoV-2 RNA is generally detectable in upper respiratory specimens during the acute phase of infection. Positive results are indicative of the presence of the identified virus, but do not rule out bacterial infection or co-infection with other pathogens not detected by the test. Clinical correlation with patient history and other  diagnostic information is necessary to determine patient infection status. The expected result is Negative.  Fact Sheet for Patients: EntrepreneurPulse.com.au  Fact Sheet for Healthcare Providers: IncredibleEmployment.be  This test is not yet approved or cleared by the [place] FDA and  has been authorized for detection and/or diagnosis of SARS-CoV-2 by FDA under an Emergency Use Authorization (EUA).  This EUA will remain in effect (meaning this test c an be used) for the duration of  the COVID-19 declaration under Section 564(b)(1) of the Act, 21 U.S.C. section 360bbb-3(b)(1), unless the authorization is terminated or revoked sooner.     Influenza A by PCR NEGATIVE NEGATIVE   Influenza B by PCR NEGATIVE NEGATIVE    Comment: (NOTE) The Xpert Xpress SARS-CoV-2/FLU/RSV plus assay is intended as an aid in the diagnosis of influenza from Nasopharyngeal swab specimens and should not be used as a sole basis for treatment. Nasal washings and aspirates are unacceptable for Xpert Xpress SARS-CoV-2/FLU/RSV testing.  Fact Sheet for  Patients: EntrepreneurPulse.com.au  Fact Sheet for Healthcare Providers: IncredibleEmployment.be  This test is not yet approved or cleared by the [place] FDA and has been authorized for detection and/or diagnosis of SARS-CoV-2 by FDA under an Emergency Use Authorization (EUA). This EUA will remain in effect (meaning this test can be used) for the duration of the COVID-19 declaration under Section 564(b)(1) of the Act, 21 U.S.C. section 360bbb-3(b)(1), unless the authorization is terminated or revoked.  Performed at [facility], [address] [place], [place] [address]   Brain natriuretic peptide     Status: Abnormal   Collection Time: 11/10/21  5:30 PM  Result Value Ref Range   B Natriuretic Peptide 261.0 (H) 0.0 - 100.0 pg/mL    Comment: Performed at [facility], [address] [place], [place] [address]  CARDIAC CATHETERIZATION     Status: None   Collection Time: 11/18/21 11:55 AM  Result Value Ref Range   Cath EF Quantitative 60 %    Assessment/Plan:  Subclavian artery stenosis, left (HCC) The patient appears to have a high-grade left subclavian artery stenosis proximal to her left internal mammary coronary bypass graft.  This is a critical situation as this is the primary perfusion to her heart and she does have anginal symptoms.  She needs left subclavian artery intervention.  I discussed the risk and benefits of the procedure.  The patient voices her understanding and is agreeable to proceed and this has been scheduled for next week.  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 On appropriate oral medications.   HLD (hyperlipidemia) lipid control important in reducing the progression of atherosclerotic  disease. Allergic to statins      Leotis Pain 11/20/2021, 12:51 PM   This note was created with Dragon medical transcription system.  Any errors from dictation are unintentional.

## 2021-11-20 NOTE — H&P (View-Only) (Signed)
Patient ID: Sabrina [name], female   DOB: [birth year], 81 y.o.   MRN: [id number]  Chief Complaint  Patient presents with   Follow-up    Discuss procedure    HPI Sabrina [name] is a 81 y.o. female.  I am asked to see the patient by Dr. 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  The patient describes significant anginal symptoms with activity.  She had a recent cardiac catheterization showing diffuse disease and also at which time there appeared to be a significant left subclavian artery stenosis.  He was unable to cannulate the left internal mammary artery itself secondary to this left subclavian stenosis.  The patient has had a significant vascular history with peripheral arterial disease treated as well.  She does not typically have chest pain at rest but does with activity.     Past Medical History:  Diagnosis Date   Amputation of fifth toe, right, traumatic, subsequent encounter ([place]) 06/18/2019   Anemia of chronic kidney failure    Anxiety    CAD (coronary artery disease)    Carotid artery stenosis    Chronic kidney disease, stage III (moderate) (HCC)    Followed by Dr. 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[place])    Spinal stenosis of lumbar region at multiple levels    Subclavian arterial stenosis ([place])     Past Surgical History:  Procedure Laterality Date   ABDOMINAL HYSTERECTOMY  1976   CAROTID ARTERY ANGIOPLASTY Left    CAROTID ENDARTERECTOMY Left    CHOLECYSTECTOMY     COLONOSCOPY WITH PROPOFOL N/A 08/16/2017   Procedure: COLONOSCOPY WITH PROPOFOL;  Surgeon: [name], MD;  Location: [facility];  Service: Endoscopy;  Laterality: N/A;   CORONARY ANGIOPLASTY WITH STENT PLACEMENT  2000   CORONARY ARTERY BYPASS GRAFT  2000   CYSTOSCOPY WITH STENT PLACEMENT Bilateral    ESOPHAGOGASTRODUODENOSCOPY  (EGD) WITH PROPOFOL N/A 08/16/2017   Procedure: ESOPHAGOGASTRODUODENOSCOPY (EGD) WITH PROPOFOL;  Surgeon: [name], MD;  Location: [facility];  Service: Endoscopy;  Laterality: N/A;   ESOPHAGOGASTRODUODENOSCOPY (EGD) WITH PROPOFOL N/A 06/29/2018   Procedure: ESOPHAGOGASTRODUODENOSCOPY (EGD) WITH PROPOFOL;  Surgeon: Virgel Manifold, MD;  Location: [facility];  Service: Endoscopy;  Laterality: N/A;   JOINT REPLACEMENT Bilateral 2000   LEFT HEART CATH AND CORONARY ANGIOGRAPHY N/A 11/18/2021   Procedure: LEFT HEART CATH AND CORONARY ANGIOGRAPHY;  Surgeon: Yolonda Kida, MD;  Location: [facility];  Service: Cardiovascular;  Laterality: N/A;   RENAL ARTERY ANGIOPLASTY Bilateral mARCH 2015   TOE AMPUTATION Right    small toe   TONSILLECTOMY AND ADENOIDECTOMY     TOTAL HIP ARTHROPLASTY Left    TOTAL HIP ARTHROPLASTY Right 2015     Family History  Problem Relation Age of Onset   Stroke Mother    Hypertension Mother    Diabetes Mother    Hypertension Father    Heart disease Sister        MI   Multiple sclerosis Daughter    Multiple sclerosis Son    Cerebral aneurysm Son    Seizures Son    Cerebral aneurysm Son    Breast cancer Paternal Aunt 34     Social History   Tobacco Use   Smoking status: Former    Types: Cigarettes    Quit date: 10/25/1998  Years since quitting: 23.0   Smokeless tobacco: Never  Vaping Use   Vaping Use: Never used  Substance Use Topics   Alcohol use: No    Alcohol/week: 0.0 standard drinks   Drug use: Never     Allergies  Allergen Reactions   Citalopram     Throat closing    Dilaudid [Hydromorphone Hcl] Nausea And Vomiting   Hydrochlorothiazide Other (See Comments)    Decreased GFR (Nov 2015)   Nsaids     CKD stage III - avoid nephrotoxic drugs   Nubain [Nalbuphine Hcl]     Burning sensation in back   Penicillins Itching   Prasugrel Itching   Statins Itching    Current Outpatient Medications  Medication Sig  Dispense Refill   albuterol (VENTOLIN HFA) 108 (90 Base) MCG/ACT inhaler Inhale 2 puffs into the lungs every 6 (six) hours as needed for wheezing or shortness of breath. 8 g 2   aspirin 81 MG tablet Take 81 mg by mouth daily.     butalbital-acetaminophen-caffeine (FIORICET) 50-325-40 MG tablet Take 1 tablet by mouth every 6 (six) hours as needed for headache. 120 tablet 1   chlordiazePOXIDE (LIBRIUM) 25 MG capsule TAKE 1 CAPSULE BY MOUTH ONCE DAILY AS NEEDED FOR ANXIETY 30 capsule 5   chlorpheniramine-HYDROcodone (TUSSIONEX PENNKINETIC ER) 10-8 MG/5ML SUER Take 5 mLs by mouth every 12 (twelve) hours as needed. 140 mL 0   clopidogrel (PLAVIX) 75 MG tablet Take 1 tablet (75 mg total) by mouth daily. To prevent strokes 90 tablet 1   ezetimibe (ZETIA) 10 MG tablet Take 1 tablet (10 mg total) by mouth daily. 90 tablet 1   hydrALAZINE (APRESOLINE) 50 MG tablet Take 1.5 tablets (75 mg total) by mouth 3 (three) times daily. Marland KitchenNOTE DOSE INCREASE TO 75 MG (Patient taking differently: Take 75 mg by mouth 3 (three) times daily. Marland KitchenNOTE DOSE INCREASE TO 75 MG pt reports prn for sbp greater than 150) 405 tablet 1   isosorbide mononitrate (IMDUR) 30 MG 24 hr tablet Take 30 mg by mouth daily.     labetalol (NORMODYNE) 200 MG tablet Take 2 tablets (400 mg total) by mouth 2 (two) times daily. For hypertension 120 tablet 1   nortriptyline (PAMELOR) 10 MG capsule Take 1 capsule (10 mg total) by mouth 2 (two) times daily. For chronic headaches 180 capsule 1   Omega 3 1000 MG CAPS Take 1,000 mg by mouth 2 (two) times daily.     pantoprazole (PROTONIX) 40 MG tablet Take 1 tablet (40 mg total) by mouth daily. 30 tablet 5   ranolazine (RANEXA) 500 MG 12 hr tablet Take 500 mg by mouth 2 (two) times daily.     valsartan (DIOVAN) 320 MG tablet Take 1 tablet (320 mg total) by mouth daily. 90 tablet 0   liothyronine (CYTOMEL) 5 MCG tablet Take 1 tablet (5 mcg total) by mouth daily. For underactive thyroid (Patient not taking:  Reported on 11/11/2021) 90 tablet 1   No current facility-administered medications for this visit.      REVIEW OF SYSTEMS (Negative unless checked)  Constitutional: [] Weight loss  [] Fever  [] Chills Cardiac: [x] Chest pain   [] Chest pressure   [] Palpitations   [] Shortness of breath when laying flat   [] Shortness of breath at rest   [x] Shortness of breath with exertion. Vascular:  [x] Pain in legs with walking   [] Pain in legs at rest   [] Pain in legs when laying flat   [x] Claudication   [] Pain in feet when walking  []   Pain in feet at rest  [] Pain in feet when laying flat   [] History of DVT   [] Phlebitis   [] Swelling in legs   [] Varicose veins   [] Non-healing ulcers Pulmonary:   [] Uses home oxygen   [] Productive cough   [] Hemoptysis   [] Wheeze  [] COPD   [] Asthma Neurologic:  [] Dizziness  [] Blackouts   [] Seizures   [] History of stroke   [] History of TIA  [] Aphasia   [] Temporary blindness   [] Dysphagia   [] Weakness or numbness in arms   [] Weakness or numbness in legs Musculoskeletal:  [x] Arthritis   [] Joint swelling   [x] Joint pain   [] Low back pain Hematologic:  [] Easy bruising  [] Easy bleeding   [] Hypercoagulable state   [x] Anemic  [] Hepatitis Gastrointestinal:  [] Blood in stool   [] Vomiting blood  [x] Gastroesophageal reflux/heartburn   [] Abdominal pain Genitourinary:  [x] Chronic kidney disease   [] Difficult urination  [] Frequent urination  [] Burning with urination   [] Hematuria Skin:  [] Rashes   [] Ulcers   [] Wounds Psychological:  [x] History of anxiety   []  History of major depression.    Physical Exam BP (!) 198/71 (BP Location: Right Arm)    Pulse 85    Resp 16    Wt 161 lb 12.8 oz (73.4 kg)    BMI 30.57 kg/m  Gen:  WD/WN, NAD Head: [place]/AT, No temporalis wasting. Ear/Nose/Throat: Hearing grossly intact, nares w/o erythema or drainage, oropharynx w/o Erythema/Exudate Eyes: Conjunctiva clear, sclera non-icteric  Neck: trachea midline.  No JVD.  Pulmonary:  Good air movement, respirations not  labored, no use of accessory muscles  Cardiac: RRR, no JVD Vascular:  Vessel Right Left  Radial 2+ palpable 1+ palpable                                   Gastrointestinal:. No masses, surgical incisions, or scars. Musculoskeletal: M/S 5/5 throughout.  Extremities without ischemic changes.  No deformity or atrophy.  No edema. Neurologic: Sensation grossly intact in extremities.  Symmetrical.  Speech is fluent. Motor exam as listed above. Psychiatric: Judgment intact, Mood & affect appropriate for pt's clinical situation. Dermatologic: No rashes or ulcers noted.  No cellulitis or open wounds.    Radiology CARDIAC CATHETERIZATION  Result Date: 11/19/2021   Prox RCA-1 lesion is 95% stenosed.   Prox RCA-2 lesion is 95% stenosed.   Prox RCA to Mid RCA lesion is 100% stenosed.   Dist LM to Ost LAD lesion is 75% stenosed.   Ost LAD lesion is 50% stenosed.   Ost LAD to Prox LAD lesion is 50% stenosed.   Mid LAD lesion is 95% stenosed.   Ost Cx to Prox Cx lesion is 80% stenosed.   Mid Cx lesion is 90% stenosed.   Dist Cx lesion is 80% stenosed with 80% stenosed side branch in 3rd Mrg.   Origin to Prox Graft lesion is 100% stenosed.   Dist Graft to Insertion lesion is 90% stenosed.   Prox Graft-1 lesion is 50% stenosed.   Prox Graft-2 lesion is 50% stenosed.   The left ventricular systolic function is normal.   LV end diastolic pressure is normal.   The left ventricular ejection fraction is 55-65% by visual estimate.   Nonselective injection for LIMA appeared to be patent but opacification is suboptimal   Unable to access ostium of LIMA because of severe disease in the subclavian   Tandem severe stenosis and subclavian of 90% and 75% Conclusion  Preserved left ventricular function EF of 60% Coronary Left main large free of disease LAD large proximal 50 to 75% ectatic LAD Circumflex diffuse disease 80 proximal 90 mid diffuse 80 distal OM1 occluded grafted CTO RCA large 100% occluded CTO Grafts LIMA to  mid LAD nonselective injection appears to be patent suboptimal images but unable to access the ostium because of severe subclavian disease SVG to RCA 100% occluded CTO SVG to OM with 50% proximal 90% long diffuse disease and the distal portion All vessels had TIMI-3 flow unless otherwise occluded Will refer patient to vascular for treatment of subclavian as 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 Recommend medical therapy for coronary artery disease Consider reevaluating LIMA once subclavian's treated or by left radial approach.    Labs Recent Results (from the past 2160 hour(s))  CBC     Status: Abnormal   Collection Time: 09/22/21  1:39 PM  Result Value Ref Range   WBC 10.2 4.0 - 10.5 K/uL   RBC 3.96 3.87 - 5.11 MIL/uL   Hemoglobin 11.8 (L) 12.0 - 15.0 g/dL   HCT 34.5 (L) 36.0 - 46.0 %   MCV 87.1 80.0 - 100.0 fL   MCH 29.8 26.0 - 34.0 pg   MCHC 34.2 30.0 - 36.0 g/dL   RDW 13.5 11.5 - 15.5 %   Platelets 215 150 - 400 K/uL   nRBC 0.0 0.0 - 0.2 %    Comment: Performed at [facility], [address], [place], [place] [address]  Comprehensive metabolic panel     Status: Abnormal   Collection Time: 09/22/21  1:39 PM  Result Value Ref Range   Sodium 131 (L) 135 - 145 mmol/L   Potassium 3.6 3.5 - 5.1 mmol/L   Chloride 98 98 - 111 mmol/L   CO2 25 22 - 32 mmol/L   Glucose, Bld 122 (H) 70 - 99 mg/dL    Comment: Glucose reference range applies only to samples taken after fasting for at least 8 hours.   BUN 17 8 - 23 mg/dL   Creatinine, Ser 1.10 (H) 0.44 - 1.00 mg/dL   Calcium 9.3 8.9 - 10.3 mg/dL   Total Protein 7.8 6.5 - 8.1 g/dL   Albumin 3.9 3.5 - 5.0 g/dL   AST 43 (H) 15 - 41 U/L   ALT 38 0 - 44 U/L   Alkaline Phosphatase 76 38 - 126 U/L   Total Bilirubin 0.6 0.3 - 1.2 mg/dL   GFR, Estimated 51 (L) >60 mL/min    Comment: (NOTE) Calculated using the CKD-EPI Creatinine Equation (2021)    Anion gap 8 5 - 15    Comment: Performed at [facility], [address] [place], [place] [address]  Resp Panel by RT-PCR (Flu A&B, Covid) Nasopharyngeal Swab     Status: Abnormal   Collection Time: 09/22/21  1:39 PM   Specimen: Nasopharyngeal Swab; Nasopharyngeal(NP) swabs in vial transport medium  Result Value Ref Range   SARS Coronavirus 2 by RT PCR POSITIVE (A) NEGATIVE    Comment: RESULT CALLED TO, READ BACK BY AND VERIFIED WITH: BRANDY DAVIS AT 1510 ON 09/22/21 BY SS (NOTE) SARS-CoV-2 target nucleic acids are DETECTED.  The SARS-CoV-2 RNA is generally detectable in upper respiratory specimens during the acute phase of infection. Positive results are indicative of the presence of the identified virus, but do not rule out bacterial infection or co-infection with other pathogens not detected by the test. Clinical correlation with patient history and other  diagnostic information is necessary to determine patient infection status. The expected result is Negative.  Fact Sheet for Patients: EntrepreneurPulse.com.au  Fact Sheet for Healthcare Providers: IncredibleEmployment.be  This test is not yet approved or cleared by the [place] FDA and  has been authorized for detection and/or diagnosis of SARS-CoV-2 by FDA under an Emergency Use Authorization (EUA).  This EUA will remain in effect (meaning this test c an be used) for the duration of  the COVID-19 declaration under Section 564(b)(1) of the Act, 21 U.S.C. section 360bbb-3(b)(1), unless the authorization is terminated or revoked sooner.     Influenza A by PCR NEGATIVE NEGATIVE   Influenza B by PCR NEGATIVE NEGATIVE    Comment: (NOTE) The Xpert Xpress SARS-CoV-2/FLU/RSV plus assay is intended as an aid in the diagnosis of influenza from Nasopharyngeal swab specimens and should not be used as a sole basis for treatment. Nasal washings and aspirates are unacceptable for Xpert Xpress SARS-CoV-2/FLU/RSV testing.  Fact Sheet for  Patients: EntrepreneurPulse.com.au  Fact Sheet for Healthcare Providers: IncredibleEmployment.be  This test is not yet approved or cleared by the [place] FDA and has been authorized for detection and/or diagnosis of SARS-CoV-2 by FDA under an Emergency Use Authorization (EUA). This EUA will remain in effect (meaning this test can be used) for the duration of the COVID-19 declaration under Section 564(b)(1) of the Act, 21 U.S.C. section 360bbb-3(b)(1), unless the authorization is terminated or revoked.  Performed at [facility], [address] [place], [place] [address]   Brain natriuretic peptide     Status: Abnormal   Collection Time: 11/10/21  5:30 PM  Result Value Ref Range   B Natriuretic Peptide 261.0 (H) 0.0 - 100.0 pg/mL    Comment: Performed at [facility], [address] [place], [place] [address]  CARDIAC CATHETERIZATION     Status: None   Collection Time: 11/18/21 11:55 AM  Result Value Ref Range   Cath EF Quantitative 60 %    Assessment/Plan:  Subclavian artery stenosis, left (HCC) The patient appears to have a high-grade left subclavian artery stenosis proximal to her left internal mammary coronary bypass graft.  This is a critical situation as this is the primary perfusion to her heart and she does have anginal symptoms.  She needs left subclavian artery intervention.  I discussed the risk and benefits of the procedure.  The patient voices her understanding and is agreeable to proceed and this has been scheduled for next week.  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 On appropriate oral medications.   HLD (hyperlipidemia) lipid control important in reducing the progression of atherosclerotic  disease. Allergic to statins      Leotis Pain 11/20/2021, 12:51 PM   This note was created with Dragon medical transcription system.  Any errors from dictation are unintentional.

## 2021-11-20 NOTE — Assessment & Plan Note (Signed)
blood pressure control important in reducing the progression of atherosclerotic disease. On appropriate oral medications.  

## 2021-11-20 NOTE — Assessment & Plan Note (Signed)
Limit contrast and hydrate with the procedure

## 2021-11-23 ENCOUNTER — Encounter
Admission: RE | Disposition: A | Discharge status: Home / Self Care | Payer: Self-pay | Source: Home / Self Care | Attending: Vascular Surgery

## 2021-11-23 ENCOUNTER — Other Ambulatory Visit: Payer: Self-pay

## 2021-11-23 ENCOUNTER — Encounter: Payer: Self-pay | Admitting: Vascular Surgery

## 2021-11-23 ENCOUNTER — Ambulatory Visit
Admission: RE | Admit: 2021-11-23 | Discharge: 2021-11-23 | Disposition: A | Discharge status: Home / Self Care | Payer: Medicare Other | Attending: Vascular Surgery | Admitting: Vascular Surgery

## 2021-11-23 ENCOUNTER — Other Ambulatory Visit (INDEPENDENT_AMBULATORY_CARE_PROVIDER_SITE_OTHER): Payer: Self-pay | Admitting: Nurse Practitioner

## 2021-11-23 DIAGNOSIS — I771 Stricture of artery: Secondary | ICD-10-CM | POA: Insufficient documentation

## 2021-11-23 DIAGNOSIS — I509 Heart failure, unspecified: Secondary | ICD-10-CM | POA: Diagnosis not present

## 2021-11-23 DIAGNOSIS — I130 Hypertensive heart and chronic kidney disease with heart failure and stage 1 through stage 4 chronic kidney disease, or unspecified chronic kidney disease: Secondary | ICD-10-CM | POA: Insufficient documentation

## 2021-11-23 DIAGNOSIS — I739 Peripheral vascular disease, unspecified: Secondary | ICD-10-CM | POA: Insufficient documentation

## 2021-11-23 DIAGNOSIS — Z87891 Personal history of nicotine dependence: Secondary | ICD-10-CM | POA: Insufficient documentation

## 2021-11-23 DIAGNOSIS — E785 Hyperlipidemia, unspecified: Secondary | ICD-10-CM | POA: Diagnosis not present

## 2021-11-23 DIAGNOSIS — N183 Chronic kidney disease, stage 3 unspecified: Secondary | ICD-10-CM | POA: Insufficient documentation

## 2021-11-23 HISTORY — PX: UPPER EXTREMITY ANGIOGRAPHY: CATH118270

## 2021-11-23 LAB — CREATININE, SERUM
Creatinine, Ser: 1.4 mg/dL — ABNORMAL HIGH (ref 0.44–1.00)
GFR, Estimated: 38 mL/min — ABNORMAL LOW (ref >60)

## 2021-11-23 LAB — BUN: BUN: 19 mg/dL (ref 8–23)

## 2021-11-23 SURGERY — UPPER EXTREMITY ANGIOGRAPHY
Anesthesia: Moderate Sedation | Laterality: Left

## 2021-11-23 MED ORDER — FENTANYL CITRATE (PF) 100 MCG/2ML IJ SOLN: 12.5000 ug | Freq: Once | INTRAMUSCULAR | Status: DC | PRN

## 2021-11-23 MED ORDER — SODIUM CHLORIDE 0.9 % IV SOLN: 250.0000 mL | INTRAVENOUS | Status: DC | PRN

## 2021-11-23 MED ORDER — MIDAZOLAM HCL 2 MG/2ML IJ SOLN
INTRAMUSCULAR | Status: AC
  Filled 2021-11-23: qty 4

## 2021-11-23 MED ORDER — FENTANYL CITRATE (PF) 100 MCG/2ML IJ SOLN
INTRAMUSCULAR | Status: DC | PRN
  Administered 2021-11-23: 50 ug via INTRAVENOUS
  Administered 2021-11-23 (×3): 25 ug via INTRAVENOUS

## 2021-11-23 MED ORDER — HYDRALAZINE HCL 20 MG/ML IJ SOLN: 5.0000 mg | INTRAMUSCULAR | Status: DC | PRN

## 2021-11-23 MED ORDER — IODIXANOL 320 MG/ML IV SOLN
INTRAVENOUS | Status: DC | PRN
  Administered 2021-11-23: 65 mL via INTRA_ARTERIAL

## 2021-11-23 MED ORDER — SODIUM CHLORIDE 0.9 % IV SOLN: INTRAVENOUS | Status: DC

## 2021-11-23 MED ORDER — CLINDAMYCIN PHOSPHATE 300 MG/50ML IV SOLN
INTRAVENOUS | Status: AC
  Administered 2021-11-23: 300 mg via INTRAVENOUS
  Filled 2021-11-23: qty 50

## 2021-11-23 MED ORDER — FENTANYL CITRATE PF 50 MCG/ML IJ SOSY
PREFILLED_SYRINGE | INTRAMUSCULAR | Status: AC
  Filled 2021-11-23: qty 1

## 2021-11-23 MED ORDER — HEPARIN SODIUM (PORCINE) 1000 UNIT/ML IJ SOLN
INTRAMUSCULAR | Status: DC | PRN
  Administered 2021-11-23: 5000 [IU] via INTRAVENOUS

## 2021-11-23 MED ORDER — METHYLPREDNISOLONE SODIUM SUCC 125 MG IJ SOLR: 125.0000 mg | Freq: Once | INTRAMUSCULAR | Status: DC | PRN

## 2021-11-23 MED ORDER — ACETAMINOPHEN 325 MG PO TABS
ORAL_TABLET | ORAL | Status: AC
  Administered 2021-11-23: 650 mg via ORAL
  Filled 2021-11-23: qty 2

## 2021-11-23 MED ORDER — SODIUM CHLORIDE 0.9% FLUSH: 3.0000 mL | INTRAVENOUS | Status: DC | PRN

## 2021-11-23 MED ORDER — ONDANSETRON HCL 4 MG/2ML IJ SOLN: 4.0000 mg | Freq: Four times a day (QID) | INTRAMUSCULAR | Status: DC | PRN

## 2021-11-23 MED ORDER — ACETAMINOPHEN 325 MG PO TABS: 650.0000 mg | ORAL_TABLET | ORAL | Status: DC | PRN

## 2021-11-23 MED ORDER — SODIUM CHLORIDE 0.9% FLUSH: 3.0000 mL | Freq: Two times a day (BID) | INTRAVENOUS | Status: DC

## 2021-11-23 MED ORDER — HEPARIN SODIUM (PORCINE) 1000 UNIT/ML IJ SOLN
INTRAMUSCULAR | Status: AC
  Filled 2021-11-23: qty 10

## 2021-11-23 MED ORDER — LABETALOL HCL 5 MG/ML IV SOLN: 10.0000 mg | INTRAVENOUS | Status: DC | PRN

## 2021-11-23 MED ORDER — MIDAZOLAM HCL 2 MG/2ML IJ SOLN
INTRAMUSCULAR | Status: DC | PRN
  Administered 2021-11-23: .5 mg via INTRAVENOUS
  Administered 2021-11-23: 2 mg via INTRAVENOUS
  Administered 2021-11-23: .5 mg via INTRAVENOUS
  Administered 2021-11-23: 1 mg via INTRAVENOUS

## 2021-11-23 MED ORDER — FAMOTIDINE 20 MG PO TABS: 40.0000 mg | ORAL_TABLET | Freq: Once | ORAL | Status: DC | PRN

## 2021-11-23 MED ORDER — DIPHENHYDRAMINE HCL 50 MG/ML IJ SOLN
50.0000 mg | Freq: Once | INTRAMUSCULAR | Status: DC | PRN
Start: 2021-11-23 — End: 2021-11-23

## 2021-11-23 MED ORDER — CLINDAMYCIN PHOSPHATE 300 MG/50ML IV SOLN: 300.0000 mg | Freq: Once | INTRAVENOUS | Status: AC

## 2021-11-23 MED ORDER — MIDAZOLAM HCL 2 MG/ML PO SYRP: 8.0000 mg | ORAL_SOLUTION | Freq: Once | ORAL | Status: DC | PRN

## 2021-11-23 MED ORDER — FENTANYL CITRATE PF 50 MCG/ML IJ SOSY
PREFILLED_SYRINGE | INTRAMUSCULAR | Status: AC
  Filled 2021-11-23: qty 2

## 2021-11-23 SURGICAL SUPPLY — 19 items
BALLN LUTONIX DCB 7X60X130 (BALLOONS) ×2
BALLN ULTRVRSE 9X40X75C (BALLOONS) ×2
BALLOON LUTONIX DCB 7X60X130 (BALLOONS) IMPLANT
BALLOON ULTRVRSE 9X40X75C (BALLOONS) IMPLANT
CATH ANGIO 5F PIGTAIL 100CM (CATHETERS) ×1 IMPLANT
CATH BEACON 5 .035 100 H1 TIP (CATHETERS) ×1 IMPLANT
CATH G XP .038X100 (CATHETERS) ×1 IMPLANT
COVER PROBE U/S 5X48 (MISCELLANEOUS) ×1 IMPLANT
DEVICE STARCLOSE SE CLOSURE (Vascular Products) ×1 IMPLANT
DEVICE TORQUE .025-.038 (MISCELLANEOUS) ×1 IMPLANT
GAUZE SPONGE 4X4 12PLY STRL (GAUZE/BANDAGES/DRESSINGS) ×3 IMPLANT
GLIDEWIRE ANGLED SS 035X260CM (WIRE) ×1 IMPLANT
KIT ENCORE 26 ADVANTAGE (KITS) ×1 IMPLANT
SHEATH BRITE TIP 5FRX11 (SHEATH) ×1 IMPLANT
SHEATH PINNACLE ST 7F 65CM (SHEATH) ×1 IMPLANT
STENT LIFESTREAM 8X37X80 (Permanent Stent) ×1 IMPLANT
TUBING CONTRAST HIGH PRESS 72 (TUBING) ×1 IMPLANT
WIRE GUIDERIGHT .035X150 (WIRE) ×1 IMPLANT
WIRE MAGIC TORQUE 260C (WIRE) ×1 IMPLANT

## 2021-11-23 NOTE — Interval H&P Note (Signed)
History and Physical Interval Note:  11/23/2021 1:13 PM  Sabrina [name]  has presented today for surgery, with the diagnosis of LT Subclavian Stent Placement   LT Subclavian Artery Stenosis.  The various methods of treatment have been discussed with the patient and family. After consideration of risks, benefits and other options for treatment, the patient has consented to  Procedure(s): UPPER EXTREMITY ANGIOGRAPHY (Left) as a surgical intervention.  The patient's history has been reviewed, patient examined, no change in status, stable for surgery.  I have reviewed the patient's chart and labs.  Questions were answered to the patient's satisfaction.     Leotis Pain

## 2021-11-23 NOTE — Op Note (Signed)
OPERATIVE REPORT     PREOPERATIVE DIAGNOSIS: 1.  Left subclavian artery stenosis proximal to left internal mammary coronary bypass   POSTOPERATIVE DIAGNOSIS: Same as above   PROCEDURE PERFORMED: 1. Ultrasound guidance vascular access to right femoral artery. 2. Catheter placement left brachial artery from right femoral approach 3. Thoracic aortogram and selective left upper extremity angiogram 4.  Stent placement to the left proximal subclavian artery with 8 mm diameter by 38 mm length lifestream stent postdilated 9 mm 5. StarClose closure device right femoral artery.   SURGEON:  Algernon Huxley, MD   ANESTHESIA:  Local with moderate conscious sedation for 47 minutes using 4 mg of Versed and 125 mcg of Fentanyl   BLOOD LOSS:  Minimal.   FLUOROSCOPY TIME:  5.5  minutes   INDICATION FOR PROCEDURE:  This is a 81 y.o.female who presented after cardiac catheterization revealed the left subclavian artery had a high-grade stenosis proximal to her left internal mammary coronary bypass.  This was the main inflow to her heart and needed to be treated.  Risks and benefits are discussed.  Informed consent was obtained.   DESCRIPTION OF PROCEDURE:  The patient was brought to the vascular suite.  Moderate conscious sedation was administered during a face to face encounter with the patient throughout the procedure with my supervision of the RN administering medicines and monitoring the patient's vital signs, pulse oximetry, telemetry and mental status throughout from the start of the procedure until the patient was taken to the recovery room.  Groins were shaved and prepped and sterile surgical field was created.  The right femoral head was localized with fluoroscopy and the right femoral artery was then visualized with ultrasound and found to be widely patent.  It was then accessed under direct ultrasound guidance without difficulty with a Seldinger needle and a permanent image was recorded.  A J-wire  and 5-French sheath were then placed.  Pigtail catheter was placed into the ascending aorta and a thoracic aortogram was then performed in the LAO projection. This demonstrated normal origins to the great vessels without significant proximal stenoses and a normal configuration of the great vessels although there was significant proximal stenosis in all 3 great vessels.  The innominate artery appeared to have a 50 to 60% stenosis.  The left common carotid artery appeared to have about a 70 to 75% stenosis just beyond its origin, and the left subclavian artery clearly had a high-grade stenosis of greater than 80%.  The patient was systemically heparinized I cannulated the left subclavian artery without difficulty with a headhunter catheter and selective imaging demonstrated the 80 to 85% stenosis of the proximal left subclavian artery with poststenotic dilatation in about a centimeter to a centimeter and a half later there was another stenosis in the 70 to 80% range.  This was just proximal to the vertebral artery origin.  The mammary origin was just distal to this.  I then advanced a headhunter and then exchanged for a glide catheter out to the left brachial artery.  There was mild disease in the left axillary artery and left brachial artery but this did not appear flow-limiting.  I then placed a Magic torque wire and remove the diagnostic catheter upsizing to a long 7 French sheath that was parked at the origin of the left subclavian artery.  Due to the tight lesion and the dense calcific nature of the lesion, I predilated the lesion with a 7 mm diameter Lutonix drug-coated angioplasty balloon.  I then  advanced the sheath and selected an 8 mm diameter by 38 mm length lifestream stent and deployed this just back into the aorta 2 to 3 mm and across both lesions staying proximal to the left vertebral artery.  This was then postdilated with a 9 mm balloon with excellent angiographic completion result and less than  10% residual stenosis. The diagnostic catheter was removed.  Oblique arteriogram was performed of the right femoral artery and StarClose closure device deployed in the usual fashion with excellent hemostatic result. The patient tolerated the procedure well and was taken to the recovery room in stable condition.    Sabrina [name] 11/23/2021 4:28 PM

## 2021-11-24 ENCOUNTER — Encounter: Payer: Self-pay | Admitting: Vascular Surgery

## 2021-11-29 ENCOUNTER — Encounter: Payer: Self-pay | Admitting: Internal Medicine

## 2021-11-29 DIAGNOSIS — I10 Essential (primary) hypertension: Secondary | ICD-10-CM

## 2021-11-30 MED ORDER — LABETALOL HCL 200 MG PO TABS: 400.0000 mg | ORAL_TABLET | Freq: Two times a day (BID) | ORAL | 1 refills | Status: DC

## 2021-11-30 NOTE — Telephone Encounter (Signed)
I have refilled the Labetalol.

## 2021-12-01 ENCOUNTER — Other Ambulatory Visit: Payer: Self-pay | Admitting: Internal Medicine

## 2021-12-01 ENCOUNTER — Encounter: Payer: Self-pay | Admitting: Internal Medicine

## 2021-12-01 DIAGNOSIS — I2510 Atherosclerotic heart disease of native coronary artery without angina pectoris: Secondary | ICD-10-CM | POA: Diagnosis not present

## 2021-12-01 DIAGNOSIS — I5032 Chronic diastolic (congestive) heart failure: Secondary | ICD-10-CM | POA: Diagnosis not present

## 2021-12-01 DIAGNOSIS — I050 Rheumatic mitral stenosis: Secondary | ICD-10-CM | POA: Diagnosis not present

## 2021-12-01 DIAGNOSIS — I208 Other forms of angina pectoris: Secondary | ICD-10-CM | POA: Diagnosis not present

## 2021-12-01 DIAGNOSIS — J449 Chronic obstructive pulmonary disease, unspecified: Secondary | ICD-10-CM | POA: Diagnosis not present

## 2021-12-01 DIAGNOSIS — E782 Mixed hyperlipidemia: Secondary | ICD-10-CM | POA: Diagnosis not present

## 2021-12-01 DIAGNOSIS — I350 Nonrheumatic aortic (valve) stenosis: Secondary | ICD-10-CM | POA: Diagnosis not present

## 2021-12-01 DIAGNOSIS — I10 Essential (primary) hypertension: Secondary | ICD-10-CM | POA: Diagnosis not present

## 2021-12-01 DIAGNOSIS — Z951 Presence of aortocoronary bypass graft: Secondary | ICD-10-CM | POA: Diagnosis not present

## 2021-12-01 DIAGNOSIS — N1832 Chronic kidney disease, stage 3b: Secondary | ICD-10-CM | POA: Diagnosis not present

## 2021-12-01 DIAGNOSIS — I739 Peripheral vascular disease, unspecified: Secondary | ICD-10-CM | POA: Diagnosis not present

## 2021-12-01 DIAGNOSIS — I771 Stricture of artery: Secondary | ICD-10-CM | POA: Diagnosis not present

## 2021-12-02 ENCOUNTER — Encounter: Payer: Self-pay | Admitting: Internal Medicine

## 2021-12-04 ENCOUNTER — Other Ambulatory Visit: Payer: Self-pay | Admitting: Internal Medicine

## 2021-12-04 DIAGNOSIS — I10 Essential (primary) hypertension: Secondary | ICD-10-CM

## 2021-12-04 MED ORDER — LABETALOL HCL 300 MG PO TABS: 300.0000 mg | ORAL_TABLET | Freq: Two times a day (BID) | ORAL | 1 refills | Status: DC

## 2021-12-04 NOTE — Telephone Encounter (Signed)
Not sure what dose needs to be refilled. We recently filled the 200 mg dose but pt has been getting the 300 mg dose from Dr. Clayborn Bigness.

## 2021-12-04 NOTE — Assessment & Plan Note (Signed)
Patient reports improved readings on 300 mg bid dosing of labetalol.  New rx sent to [facility]

## 2021-12-16 ENCOUNTER — Other Ambulatory Visit (INDEPENDENT_AMBULATORY_CARE_PROVIDER_SITE_OTHER): Payer: Self-pay | Admitting: Nurse Practitioner

## 2021-12-16 ENCOUNTER — Ambulatory Visit (INDEPENDENT_AMBULATORY_CARE_PROVIDER_SITE_OTHER): Payer: Medicare Other | Admitting: Nurse Practitioner

## 2021-12-16 ENCOUNTER — Ambulatory Visit (INDEPENDENT_AMBULATORY_CARE_PROVIDER_SITE_OTHER): Payer: Medicare Other

## 2021-12-16 ENCOUNTER — Other Ambulatory Visit (INDEPENDENT_AMBULATORY_CARE_PROVIDER_SITE_OTHER): Payer: Self-pay | Admitting: Vascular Surgery

## 2021-12-16 ENCOUNTER — Other Ambulatory Visit: Payer: Self-pay

## 2021-12-16 VITALS — BP 201/81 | HR 80 | Ht 61.0 in | Wt 162.0 lb

## 2021-12-16 DIAGNOSIS — I739 Peripheral vascular disease, unspecified: Secondary | ICD-10-CM

## 2021-12-16 DIAGNOSIS — I6523 Occlusion and stenosis of bilateral carotid arteries: Secondary | ICD-10-CM | POA: Diagnosis not present

## 2021-12-16 DIAGNOSIS — Z95820 Peripheral vascular angioplasty status with implants and grafts: Secondary | ICD-10-CM

## 2021-12-16 DIAGNOSIS — Z9889 Other specified postprocedural states: Secondary | ICD-10-CM

## 2021-12-16 DIAGNOSIS — I771 Stricture of artery: Secondary | ICD-10-CM

## 2021-12-16 DIAGNOSIS — I701 Atherosclerosis of renal artery: Secondary | ICD-10-CM | POA: Diagnosis not present

## 2021-12-23 ENCOUNTER — Other Ambulatory Visit (INDEPENDENT_AMBULATORY_CARE_PROVIDER_SITE_OTHER): Payer: Medicare Other

## 2021-12-23 ENCOUNTER — Other Ambulatory Visit (INDEPENDENT_AMBULATORY_CARE_PROVIDER_SITE_OTHER): Payer: Self-pay | Admitting: Nurse Practitioner

## 2021-12-23 ENCOUNTER — Ambulatory Visit (INDEPENDENT_AMBULATORY_CARE_PROVIDER_SITE_OTHER): Payer: Medicare Other | Admitting: Nurse Practitioner

## 2021-12-23 ENCOUNTER — Ambulatory Visit (INDEPENDENT_AMBULATORY_CARE_PROVIDER_SITE_OTHER): Payer: Medicare Other

## 2021-12-23 ENCOUNTER — Other Ambulatory Visit: Payer: Self-pay

## 2021-12-23 VITALS — BP 193/71 | HR 76 | Resp 16 | Ht 61.0 in | Wt 159.2 lb

## 2021-12-23 DIAGNOSIS — I701 Atherosclerosis of renal artery: Secondary | ICD-10-CM | POA: Diagnosis not present

## 2021-12-23 DIAGNOSIS — I739 Peripheral vascular disease, unspecified: Secondary | ICD-10-CM

## 2021-12-23 DIAGNOSIS — I6523 Occlusion and stenosis of bilateral carotid arteries: Secondary | ICD-10-CM

## 2021-12-23 DIAGNOSIS — Z9889 Other specified postprocedural states: Secondary | ICD-10-CM

## 2021-12-23 NOTE — H&P (View-Only) (Signed)
? ?Subjective:  ? ? Patient ID: Sabrina [name], female    DOB: August 09, 1941, 81 y.o.   MRN: [id number] ?Chief Complaint  ?Patient presents with  ? Follow-up  ?  Ultrasound follow up  ? ? ?Sabrina [name] is a 81 year old female that presents today for multiple vascular issues.  She notes a strange sensation where it feels like a flushing that moves through her genitals down her legs.  She also notes severe claudication-like symptoms that have worsened recently.  Typically the patient followed up with [place] regularly however due to the pandemic and other issues follow-up was altered.  She has also had difficulty with controlling her blood pressure recently. ? ?Today noninvasive studies show unknown right occluded ICA with 1 to 39% left ICA.  This is consistent with previous studies. ? ?There are chronic occlusions of the bilateral SFA and popliteal arteries with evidence of possible significant left iliac disease.  The right ABI is 0.57 with a left of 0.56 the tibial artery waveforms are monophasic with dampened toe waveforms bilaterally.  There is no evidence of renal artery stenosis bilaterally.  Largest aortic diameter is 2.8 cm.  Lower extremity arterial duplex also notes a greater than 70% stenosis in the right common femoral artery as well as a greater than 50% in the left distal external iliac artery. ?  ? ? ?Review of Systems  ?Cardiovascular:   ?     Claudication  ?All other systems reviewed and are negative. ? ?   ?Objective:  ? Physical Exam ?Vitals reviewed.  ?HENT:  ?   Head: Normocephalic.  ?Cardiovascular:  ?   Rate and Rhythm: Normal rate.  ?   Pulses:     ?     Dorsalis pedis pulses are detected w/ Doppler on the right side and detected w/ Doppler on the left side.  ?     Posterior tibial pulses are detected w/ Doppler on the right side and detected w/ Doppler on the left side.  ?Pulmonary:  ?   Effort: Pulmonary effort is normal.  ?Skin: ?   General: Skin is warm and dry.  ?Neurological:  ?   Mental  Status: She is alert and oriented to person, place, and time.  ?Psychiatric:     ?   Mood and Affect: Mood normal.     ?   Behavior: Behavior normal.     ?   Thought Content: Thought content normal.     ?   Judgment: Judgment normal.  ? ? ?BP (!) 193/71 (BP Location: Right Arm)   Pulse 76   Resp 16   Ht 5\' 1"  (1.549 m)   Wt 159 lb 3.2 oz (72.2 kg)   BMI 30.08 kg/m?  ? ?Past Medical History:  ?Diagnosis Date  ? Amputation of fifth toe, right, traumatic, subsequent encounter ([place]) 06/18/2019  ? Anemia of chronic kidney failure   ? Anxiety   ? CAD (coronary artery disease)   ? Carotid artery stenosis   ? Chronic kidney disease, stage III (moderate) (HCC)   ? Followed by Dr. Juleen China  ? Hyperlipidemia   ? Hypertension   ? Renal artery stenosis ([place])   ? Secondary hyperparathyroidism ([place])   ? Spinal stenosis of lumbar region at multiple levels   ? 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 ? ?Social History  ? ?Socioeconomic History  ? Marital status: Divorced  ?  Spouse name: Not on file  ? Number of children: Not on file  ?  Years of education: Not on file  ? Highest education level: Not on file  ?Occupational History  ? Occupation: retired  ?Tobacco Use  ? Smoking status: Former  ?  Types: Cigarettes  ?  Quit date: 10/25/1998  ?  Years since quitting: 23.2  ? Smokeless tobacco: Never  ?Vaping Use  ? Vaping Use: Never used  ?Substance and Sexual Activity  ? Alcohol use: No  ?  Alcohol/week: 0.0 standard drinks  ? Drug use: Never  ? Sexual activity: Not Currently  ?Other Topics Concern  ? Not on file  ?Social History Narrative  ? Daughter Sabrina [name] at bedside.  ? ?Social Determinants of Health  ? ?Financial Resource Strain: Not on file  ?Food Insecurity: Not on file  ?Transportation Needs: Not on file  ?Physical Activity: Not on file  ?Stress: Not on file  ?Social Connections: Not on file  ?Intimate Partner Violence: Not on file  ? ? ?Past Surgical History:  ?Procedure Laterality Date  ? ABDOMINAL HYSTERECTOMY  1976  ? CAROTID  ARTERY ANGIOPLASTY Left   ? CAROTID ENDARTERECTOMY Left   ? CHOLECYSTECTOMY    ? COLONOSCOPY WITH PROPOFOL N/A 08/16/2017  ? Procedure: COLONOSCOPY WITH PROPOFOL;  Surgeon: [name], MD;  Location: [facility] ENDOSCOPY;  Service: Endoscopy;  Laterality: N/A;  ? CORONARY ANGIOPLASTY WITH STENT PLACEMENT  2000  ? CORONARY ARTERY BYPASS GRAFT  2000  ? CYSTOSCOPY WITH STENT PLACEMENT Bilateral   ? ESOPHAGOGASTRODUODENOSCOPY (EGD) WITH PROPOFOL N/A 08/16/2017  ? Procedure: ESOPHAGOGASTRODUODENOSCOPY (EGD) WITH PROPOFOL;  Surgeon: [name], MD;  Location: [facility] ENDOSCOPY;  Service: Endoscopy;  Laterality: N/A;  ? ESOPHAGOGASTRODUODENOSCOPY (EGD) WITH PROPOFOL N/A 06/29/2018  ? Procedure: ESOPHAGOGASTRODUODENOSCOPY (EGD) WITH PROPOFOL;  Surgeon: Virgel Manifold, MD;  Location: [facility] ENDOSCOPY;  Service: Endoscopy;  Laterality: N/A;  ? JOINT REPLACEMENT Bilateral 2000  ? LEFT HEART CATH AND CORONARY ANGIOGRAPHY N/A 11/18/2021  ? Procedure: LEFT HEART CATH AND CORONARY ANGIOGRAPHY;  Surgeon: Yolonda Kida, MD;  Location: [place] [facility];  Service: Cardiovascular;  Laterality: N/A;  ? RENAL ARTERY ANGIOPLASTY Bilateral mARCH 2015  ? TOE AMPUTATION Right   ? small toe  ? TONSILLECTOMY AND ADENOIDECTOMY    ? TOTAL HIP ARTHROPLASTY Left   ? TOTAL HIP ARTHROPLASTY Right 2015  ? UPPER EXTREMITY ANGIOGRAPHY Left 11/23/2021  ? Procedure: UPPER EXTREMITY ANGIOGRAPHY;  Surgeon: Algernon Huxley, MD;  Location: [place] [facility];  Service: Cardiovascular;  Laterality: Left;  ? ? ?Family History  ?Problem Relation Age of Onset  ? Stroke Mother   ? Hypertension Mother   ? Diabetes Mother   ? Hypertension Father   ? Heart disease Sister   ?     MI  ? Multiple sclerosis Daughter   ? Multiple sclerosis Son   ? Cerebral aneurysm Son   ? Seizures Son   ? Cerebral aneurysm Son   ? Breast cancer Paternal Aunt 24  ? ? ?Allergies  ?Allergen Reactions  ? Citalopram   ?  Throat closing   ? Dilaudid [Hydromorphone Hcl] Nausea And Vomiting   ? Hydrochlorothiazide Other (See Comments)  ?  Decreased GFR (Nov 2015)  ? Nsaids   ?  CKD stage III - avoid nephrotoxic drugs  ? Nubain [Nalbuphine Hcl]   ?  Burning sensation in back  ? Penicillins Itching  ? Prasugrel Itching  ? Statins Itching  ? ? ?CBC Latest Ref Rng & Units 09/22/2021 06/13/2021 03/06/2021  ?WBC 4.0 - 10.5 K/uL 10.2 6.2 5.1  ?  Hemoglobin 12.0 - 15.0 g/dL 11.8(L) 13.2 11.4(L)  ?Hematocrit 36.0 - 46.0 % 34.5(L) 38.9 34.2(L)  ?Platelets 150 - 400 K/uL 215 264 236  ? ? ? ? ?CMP  ?   ?Component Value Date/Time  ? NA 131 (L) 09/22/2021 1339  ? NA 139 09/13/2014 0000  ? NA 134 (L) 04/12/2014 0558  ? K 3.6 09/22/2021 1339  ? K 4.4 04/12/2014 0558  ? CL 98 09/22/2021 1339  ? CL 101 04/12/2014 0558  ? CO2 25 09/22/2021 1339  ? CO2 27 04/12/2014 0558  ? GLUCOSE 122 (H) 09/22/2021 1339  ? GLUCOSE 90 04/12/2014 0558  ? BUN 19 11/23/2021 1454  ? BUN 18 09/13/2014 0000  ? BUN 16 04/12/2014 0558  ? CREATININE 1.40 (H) 11/23/2021 1454  ? CREATININE 1.12 04/12/2014 0558  ? CALCIUM 9.3 09/22/2021 1339  ? CALCIUM 8.5 04/12/2014 0558  ? PROT 7.8 09/22/2021 1339  ? PROT 8.2 01/30/2012 0118  ? ALBUMIN 3.9 09/22/2021 1339  ? ALBUMIN 3.9 01/30/2012 0118  ? AST 43 (H) 09/22/2021 1339  ? AST 31 01/30/2012 0118  ? ALT 38 09/22/2021 1339  ? ALT 22 01/30/2012 0118  ? ALKPHOS 76 09/22/2021 1339  ? ALKPHOS 79 01/30/2012 0118  ? BILITOT 0.6 09/22/2021 1339  ? BILITOT 0.5 01/30/2012 0118  ? GFRNONAA 38 (L) 11/23/2021 1454  ? GFRNONAA 49 (L) 04/12/2014 0558  ? GFRAA 50 (L) 03/10/2020 1311  ? GFRAA 57 (L) 04/12/2014 0558  ? ? ? ?VAS [place] ABI WITH/WO TBI ? ?Result Date: 12/24/2021 ? LOWER EXTREMITY DOPPLER STUDY Patient Name:  Sabrina [name]  Date of Exam:   12/23/2021 Medical Rec #: [id number]           Accession #:    [id number] Date of Birth: 08/12/1941           Patient Gender: F Patient Age:   5 years Exam Location:  [facility] Procedure:      VAS [place] ABI WITH/WO TBI Referring Phys: Eulogio Ditch  --------------------------------------------------------------------------------  Indications: Peripheral artery disease.  Vascular Interventions: Bilateral renal artery stents on 2015;. Performing Technologist: Cherylin [name]

## 2021-12-23 NOTE — Progress Notes (Signed)
Subjective:    Patient ID: Sabrina [name], female    DOB: 04/04/1941, 81 y.o.   MRN: [id number] Chief Complaint  Patient presents with   Follow-up    Ultrasound follow up    Sabrina [name] is a 81 year old female that presents today for multiple vascular issues.  She notes a strange sensation where it feels like a flushing that moves through her genitals down her legs.  She also notes severe claudication-like symptoms that have worsened recently.  Typically the patient followed up with [place] regularly however due to the pandemic and other issues follow-up was altered.  She has also had difficulty with controlling her blood pressure recently.  Today noninvasive studies show unknown right occluded ICA with 1 to 39% left ICA.  This is consistent with previous studies.  There are chronic occlusions of the bilateral SFA and popliteal arteries with evidence of possible significant left iliac disease.  The right ABI is 0.57 with a left of 0.56 the tibial artery waveforms are monophasic with dampened toe waveforms bilaterally.  There is no evidence of renal artery stenosis bilaterally.  Largest aortic diameter is 2.8 cm.  Lower extremity arterial duplex also notes a greater than 70% stenosis in the right common femoral artery as well as a greater than 50% in the left distal external iliac artery.     Review of Systems  Cardiovascular:        Claudication  All other systems reviewed and are negative.     Objective:   Physical Exam Vitals reviewed.  HENT:     Head: Normocephalic.  Cardiovascular:     Rate and Rhythm: Normal rate.     Pulses:          Dorsalis pedis pulses are detected w/ Doppler on the right side and detected w/ Doppler on the left side.       Posterior tibial pulses are detected w/ Doppler on the right side and detected w/ Doppler on the left side.  Pulmonary:     Effort: Pulmonary effort is normal.  Skin:    General: Skin is warm and dry.  Neurological:     Mental  Status: She is alert and oriented to person, place, and time.  Psychiatric:        Mood and Affect: Mood normal.        Behavior: Behavior normal.        Thought Content: Thought content normal.        Judgment: Judgment normal.    BP (!) 193/71 (BP Location: Right Arm)    Pulse 76    Resp 16    Ht 5\' 1"  (1.549 m)    Wt 159 lb 3.2 oz (72.2 kg)    BMI 30.08 kg/m   Past Medical History:  Diagnosis Date   Amputation of fifth toe, right, traumatic, subsequent encounter ([place]) 06/18/2019   Anemia of chronic kidney failure    Anxiety    CAD (coronary artery disease)    Carotid artery stenosis    Chronic kidney disease, stage III (moderate) (HCC)    Followed by Dr. Juleen China   Hyperlipidemia    Hypertension    Renal artery stenosis ([place])    Secondary hyperparathyroidism ([place])    Spinal stenosis of lumbar region at multiple levels    Subclavian arterial stenosis [facility])     Social History   Socioeconomic History   Marital status: Divorced    Spouse name: Not on file   Number of children:  Not on file   Years of education: Not on file   Highest education level: Not on file  Occupational History   Occupation: retired  Tobacco Use   Smoking status: Former    Types: Cigarettes    Quit date: 10/25/1998    Years since quitting: 23.2   Smokeless tobacco: Never  Vaping Use   Vaping Use: Never used  Substance and Sexual Activity   Alcohol use: No    Alcohol/week: 0.0 standard drinks   Drug use: Never   Sexual activity: Not Currently  Other Topics Concern   Not on file  Social History Narrative   Daughter Sabrina [name] at bedside.   Social Determinants of Health   Financial Resource Strain: Not on file  Food Insecurity: Not on file  Transportation Needs: Not on file  Physical Activity: Not on file  Stress: Not on file  Social Connections: Not on file  Intimate Partner Violence: Not on file    Past Surgical History:  Procedure Laterality Date   ABDOMINAL HYSTERECTOMY  1976   CAROTID  ARTERY ANGIOPLASTY Left    CAROTID ENDARTERECTOMY Left    CHOLECYSTECTOMY     COLONOSCOPY WITH PROPOFOL N/A 08/16/2017   Procedure: COLONOSCOPY WITH PROPOFOL;  Surgeon: [name], MD;  Location: [facility] ENDOSCOPY;  Service: Endoscopy;  Laterality: N/A;   CORONARY ANGIOPLASTY WITH STENT PLACEMENT  2000   CORONARY ARTERY BYPASS GRAFT  2000   CYSTOSCOPY WITH STENT PLACEMENT Bilateral    ESOPHAGOGASTRODUODENOSCOPY (EGD) WITH PROPOFOL N/A 08/16/2017   Procedure: ESOPHAGOGASTRODUODENOSCOPY (EGD) WITH PROPOFOL;  Surgeon: [name], MD;  Location: [facility] ENDOSCOPY;  Service: Endoscopy;  Laterality: N/A;   ESOPHAGOGASTRODUODENOSCOPY (EGD) WITH PROPOFOL N/A 06/29/2018   Procedure: ESOPHAGOGASTRODUODENOSCOPY (EGD) WITH PROPOFOL;  Surgeon: Virgel Manifold, MD;  Location: [facility] ENDOSCOPY;  Service: Endoscopy;  Laterality: N/A;   JOINT REPLACEMENT Bilateral 2000   LEFT HEART CATH AND CORONARY ANGIOGRAPHY N/A 11/18/2021   Procedure: LEFT HEART CATH AND CORONARY ANGIOGRAPHY;  Surgeon: Yolonda Kida, MD;  Location: [place] [facility];  Service: Cardiovascular;  Laterality: N/A;   RENAL ARTERY ANGIOPLASTY Bilateral mARCH 2015   TOE AMPUTATION Right    small toe   TONSILLECTOMY AND ADENOIDECTOMY     TOTAL HIP ARTHROPLASTY Left    TOTAL HIP ARTHROPLASTY Right 2015   UPPER EXTREMITY ANGIOGRAPHY Left 11/23/2021   Procedure: UPPER EXTREMITY ANGIOGRAPHY;  Surgeon: Algernon Huxley, MD;  Location: [place] [facility];  Service: Cardiovascular;  Laterality: Left;    Family History  Problem Relation Age of Onset   Stroke Mother    Hypertension Mother    Diabetes Mother    Hypertension Father    Heart disease Sister        MI   Multiple sclerosis Daughter    Multiple sclerosis Son    Cerebral aneurysm Son    Seizures Son    Cerebral aneurysm Son    Breast cancer Paternal Aunt 50    Allergies  Allergen Reactions   Citalopram     Throat closing    Dilaudid [Hydromorphone Hcl] Nausea And Vomiting    Hydrochlorothiazide Other (See Comments)    Decreased GFR (Nov 2015)   Nsaids     CKD stage III - avoid nephrotoxic drugs   Nubain [Nalbuphine Hcl]     Burning sensation in back   Penicillins Itching   Prasugrel Itching   Statins Itching    CBC Latest Ref Rng & Units 09/22/2021 06/13/2021 03/06/2021  WBC 4.0 - 10.5  K/uL 10.2 6.2 5.1  Hemoglobin 12.0 - 15.0 g/dL 11.8(L) 13.2 11.4(L)  Hematocrit 36.0 - 46.0 % 34.5(L) 38.9 34.2(L)  Platelets 150 - 400 K/uL 215 264 236      CMP     Component Value Date/Time   NA 131 (L) 09/22/2021 1339   NA 139 09/13/2014 0000   NA 134 (L) 04/12/2014 0558   K 3.6 09/22/2021 1339   K 4.4 04/12/2014 0558   CL 98 09/22/2021 1339   CL 101 04/12/2014 0558   CO2 25 09/22/2021 1339   CO2 27 04/12/2014 0558   GLUCOSE 122 (H) 09/22/2021 1339   GLUCOSE 90 04/12/2014 0558   BUN 19 11/23/2021 1454   BUN 18 09/13/2014 0000   BUN 16 04/12/2014 0558   CREATININE 1.40 (H) 11/23/2021 1454   CREATININE 1.12 04/12/2014 0558   CALCIUM 9.3 09/22/2021 1339   CALCIUM 8.5 04/12/2014 0558   PROT 7.8 09/22/2021 1339   PROT 8.2 01/30/2012 0118   ALBUMIN 3.9 09/22/2021 1339   ALBUMIN 3.9 01/30/2012 0118   AST 43 (H) 09/22/2021 1339   AST 31 01/30/2012 0118   ALT 38 09/22/2021 1339   ALT 22 01/30/2012 0118   ALKPHOS 76 09/22/2021 1339   ALKPHOS 79 01/30/2012 0118   BILITOT 0.6 09/22/2021 1339   BILITOT 0.5 01/30/2012 0118   GFRNONAA 38 (L) 11/23/2021 1454   GFRNONAA 49 (L) 04/12/2014 0558   GFRAA 50 (L) 03/10/2020 1311   GFRAA 57 (L) 04/12/2014 0558     VAS [place] ABI WITH/WO TBI  Result Date: 12/24/2021  LOWER EXTREMITY DOPPLER STUDY Patient Name:  Dlisa A Malkiewicz  Date of Exam:   12/23/2021 Medical Rec #: [id number]           Accession #:    [id number] Date of Birth: [birth year]           Patient Gender: F Patient Age:   25 years Exam Location:  [facility] Procedure:      VAS [place] ABI WITH/WO TBI Referring Phys: Arna Medici Tasha Diaz  --------------------------------------------------------------------------------  Indications: Peripheral artery disease.  Vascular Interventions: Bilateral renal artery stents on 2015;. Performing Technologist: Blondell Reveal RT, RDMS, RVT  Examination Guidelines: A complete evaluation includes at minimum, Doppler waveform signals and systolic blood pressure reading at the level of bilateral brachial, anterior tibial, and posterior tibial arteries, when vessel segments are accessible. Bilateral testing is considered an integral part of a complete examination. Photoelectric Plethysmograph (PPG) waveforms and toe systolic pressure readings are included as required and additional duplex testing as needed. Limited examinations for reoccurring indications may be performed as noted.  ABI Findings: +---------+------------------+-----+----------+--------+  Right     Rt Pressure (mmHg) Index Waveform   Comment   +---------+------------------+-----+----------+--------+  Brachial  205                                           +---------+------------------+-----+----------+--------+  ATA       121                0.57  monophasic           +---------+------------------+-----+----------+--------+  PTA       123                0.57  monophasic           +---------+------------------+-----+----------+--------+  Great Toe 94  0.44  Abnormal             +---------+------------------+-----+----------+--------+ +---------+------------------+-----+----------+-------+  Left      Lt Pressure (mmHg) Index Waveform   Comment  +---------+------------------+-----+----------+-------+  Brachial  214                                          +---------+------------------+-----+----------+-------+  ATA       109                0.51  monophasic          +---------+------------------+-----+----------+-------+  PTA                                absent              +---------+------------------+-----+----------+-------+  PERO      114                 0.56  monophasic          +---------+------------------+-----+----------+-------+  Great Toe 80                 0.37  Abnormal            +---------+------------------+-----+----------+-------+ +-------+-----------+-----------+------------+------------+  ABI/TBI Today's ABI Today's TBI Previous ABI Previous TBI  +-------+-----------+-----------+------------+------------+  Right   0.57        0.44        0.77         0.38          +-------+-----------+-----------+------------+------------+  Left    0.56        0.37        0.59         0.30          +-------+-----------+-----------+------------+------------+ Right ABIs appear decreased compared to prior study on 10/02/20. Left ABIs appear essentially unchanged.  Summary: Right: Resting right ankle-brachial index indicates moderate right lower extremity arterial disease. The right toe-brachial index is abnormal. Left: Resting left ankle-brachial index indicates moderate left lower extremity arterial disease. The left toe-brachial index is abnormal.  *See table(s) above for measurements and observations.  Electronically signed by Hortencia Pilar MD on 12/24/2021 at 4:01:51 PM.    Final        Assessment & Plan:   1. Atherosclerosis of renal artery ([place]) Given patient's arterial disease optimal control of the patient's hypertension is important. BP is acceptable today  The patient's vital signs and noninvasive studies support the renal artery stenosis is not significantly increased when compared to the previous study.  No invasive studies or intervention is indicated at this time.  The patient will continue the current antihypertensive medications, no changes at this time.  The primary medical service will continue aggressive antihypertensive therapy as per the AHA guidelines    2. Claudication of both lower extremities ([place]) Recommend:  The patient has experienced increased symptoms and is now describing lifestyle limiting claudication  and mild rest pain.   Given the severity of the patient's lower extremity symptoms the patient should undergo angiography and intervention.  Risk and benefits were reviewed the patient.  Indications for the procedure were reviewed.  All questions were answered, the patient agrees to proceed.   The patient should continue walking and begin a more formal exercise program.  The patient should continue antiplatelet therapy and  aggressive treatment of the lipid abnormalities  The patient will follow up with me after the angiogram.    3. Bilateral carotid artery stenosis Recommend:  Given the patient's asymptomatic subcritical stenosis no further invasive testing or surgery at this time.  Duplex ultrasound shows known right ICA occlusion with 1 to 39% stenosis left ICA  Continue antiplatelet therapy as prescribed Continue management of CAD, HTN and Hyperlipidemia Healthy heart diet,  encouraged exercise at least 4 times per week Follow up in 12 months with duplex ultrasound and physical exam     Current Outpatient Medications on File Prior to Visit  Medication Sig Dispense Refill   aspirin 81 MG tablet Take 81 mg by mouth daily.     butalbital-acetaminophen-caffeine (FIORICET) 50-325-40 MG tablet Take 1 tablet by mouth every 6 (six) hours as needed for headache. 120 tablet 1   chlorpheniramine-HYDROcodone (TUSSIONEX PENNKINETIC ER) 10-8 MG/5ML SUER Take 5 mLs by mouth every 12 (twelve) hours as needed. 140 mL 0   ezetimibe (ZETIA) 10 MG tablet Take 1 tablet (10 mg total) by mouth daily. 90 tablet 1   hydrALAZINE (APRESOLINE) 50 MG tablet Take 1.5 tablets (75 mg total) by mouth 3 (three) times daily. Marland KitchenNOTE DOSE INCREASE TO 75 MG (Patient taking differently: Take 75 mg by mouth 3 (three) times daily. Marland KitchenNOTE DOSE INCREASE TO 75 MG pt reports prn for sbp greater than 150) 405 tablet 1   isosorbide mononitrate (IMDUR) 30 MG 24 hr tablet Take 30 mg by mouth daily.     labetalol (NORMODYNE) 300 MG  tablet Take 1 tablet (300 mg total) by mouth 2 (two) times daily. For hypertension 180 tablet 1   liothyronine (CYTOMEL) 5 MCG tablet Take 1 tablet (5 mcg total) by mouth daily. For underactive thyroid 90 tablet 1   Omega 3 1000 MG CAPS Take 1,000 mg by mouth 2 (two) times daily.     pantoprazole (PROTONIX) 40 MG tablet Take 1 tablet (40 mg total) by mouth daily. 30 tablet 5   albuterol (VENTOLIN HFA) 108 (90 Base) MCG/ACT inhaler Inhale 2 puffs into the lungs every 6 (six) hours as needed for wheezing or shortness of breath. (Patient not taking: Reported on 12/23/2021) 8 g 2   chlordiazePOXIDE (LIBRIUM) 25 MG capsule TAKE 1 CAPSULE BY MOUTH ONCE DAILY AS NEEDED FOR ANXIETY (Patient not taking: Reported on 12/23/2021) 30 capsule 5   No current facility-administered medications on file prior to visit.    There are no Patient Instructions on file for this visit. No follow-ups on file.   Kris Hartmann, NP

## 2021-12-25 ENCOUNTER — Telehealth (INDEPENDENT_AMBULATORY_CARE_PROVIDER_SITE_OTHER): Payer: Self-pay

## 2021-12-25 NOTE — Telephone Encounter (Signed)
Spoke with the patient and she is scheduled with Dr. Delana Meyer for a RLE angio on 01/06/22 with a 11:00 am arrival time to the MM. Pre-procedure instructions were discussed and will be mailed. ?

## 2021-12-26 ENCOUNTER — Encounter: Payer: Self-pay | Admitting: Internal Medicine

## 2021-12-27 NOTE — Progress Notes (Signed)
Subjective:    Patient ID: Sabrina [name], female    DOB: January 30, 1941, 81 y.o.   MRN: [id number] Chief Complaint  Patient presents with   Follow-up    3 wk follow up with Lt upper arterial Duplex     Sabrina [name] is a 81 year old female that presents today after placement a stent in the left proximal subclavian artery.  It was noticed during a heart catheterization patient had significant stenosis and this was likely the cause of her continued chest pain and discomfort.  Following stent placement the patient notes that this is nearly resolved but there are other issues.  The patient does have longstanding other vascular issues.  She notes a strange sensation where it feels like a flushing that moves through her genitals down her legs.  She also notes severe claudication-like symptoms that have worsened recently.  Typically the patient followed up with [place] regularly however due to the pandemic and other issues follow-up was altered.  Today noninvasive studies show no visualized obstruction in the left upper extremity.  The left subclavian artery stent is patent.  Good biphasic and triphasic flow noted.   Review of Systems  Respiratory:  Negative for chest tightness and shortness of breath.   Cardiovascular:        Claudication  All other systems reviewed and are negative.     Objective:   Physical Exam Vitals reviewed.  HENT:     Head: Normocephalic.  Cardiovascular:     Rate and Rhythm: Normal rate.     Pulses:          Radial pulses are 1+ on the right side and 1+ on the left side.  Pulmonary:     Effort: Pulmonary effort is normal.  Skin:    General: Skin is warm and dry.  Neurological:     Mental Status: She is alert and oriented to person, place, and time.  Psychiatric:        Mood and Affect: Mood normal.        Behavior: Behavior normal.        Thought Content: Thought content normal.        Judgment: Judgment normal.    BP (!) 201/81    Pulse 80    Ht 5\' 1"   (1.549 m)    Wt 162 lb (73.5 kg)    BMI 30.61 kg/m   Past Medical History:  Diagnosis Date   Amputation of fifth toe, right, traumatic, subsequent encounter ([place]) 06/18/2019   Anemia of chronic kidney failure    Anxiety    CAD (coronary artery disease)    Carotid artery stenosis    Chronic kidney disease, stage III (moderate) (HCC)    Followed by Dr. 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[place])    Spinal stenosis of lumbar region at multiple levels    Subclavian arterial stenosis ([place])     Social History   Socioeconomic History   Marital status: Divorced    Spouse name: Not on file   Number of children: Not on file   Years of education: Not on file   Highest education level: Not on file  Occupational History   Occupation: retired  Tobacco Use   Smoking status: Former    Types: Cigarettes    Quit date: 10/25/1998    Years since quitting: 23.1   Smokeless tobacco: Never  Vaping Use   Vaping Use:  Never used  Substance and Sexual Activity   Alcohol use: No    Alcohol/week: 0.0 standard drinks   Drug use: Never   Sexual activity: Not Currently  Other Topics Concern   Not on file  Social History Narrative   Daughter [name] at bedside.   Social Determinants of Health   Financial Resource Strain: Not on file  Food Insecurity: Not on file  Transportation Needs: Not on file  Physical Activity: Not on file  Stress: Not on file  Social Connections: Not on file  Intimate Partner Violence: Not on file    Past Surgical History:  Procedure Laterality Date   ABDOMINAL HYSTERECTOMY  1976   CAROTID ARTERY ANGIOPLASTY Left    CAROTID ENDARTERECTOMY Left    CHOLECYSTECTOMY     COLONOSCOPY WITH PROPOFOL N/A 08/16/2017   Procedure: COLONOSCOPY WITH PROPOFOL;  Surgeon: [name], MD;  Location: [facility];  Service: Endoscopy;  Laterality: N/A;   CORONARY ANGIOPLASTY WITH STENT PLACEMENT  2000   CORONARY  ARTERY BYPASS GRAFT  2000   CYSTOSCOPY WITH STENT PLACEMENT Bilateral    ESOPHAGOGASTRODUODENOSCOPY (EGD) WITH PROPOFOL N/A 08/16/2017   Procedure: ESOPHAGOGASTRODUODENOSCOPY (EGD) WITH PROPOFOL;  Surgeon: [name], MD;  Location: [facility];  Service: Endoscopy;  Laterality: N/A;   ESOPHAGOGASTRODUODENOSCOPY (EGD) WITH PROPOFOL N/A 06/29/2018   Procedure: ESOPHAGOGASTRODUODENOSCOPY (EGD) WITH PROPOFOL;  Surgeon: Virgel Manifold, MD;  Location: [facility];  Service: Endoscopy;  Laterality: N/A;   JOINT REPLACEMENT Bilateral 2000   LEFT HEART CATH AND CORONARY ANGIOGRAPHY N/A 11/18/2021   Procedure: LEFT HEART CATH AND CORONARY ANGIOGRAPHY;  Surgeon: Yolonda Kida, MD;  Location: [facility];  Service: Cardiovascular;  Laterality: N/A;   RENAL ARTERY ANGIOPLASTY Bilateral mARCH 2015   TOE AMPUTATION Right    small toe   TONSILLECTOMY AND ADENOIDECTOMY     TOTAL HIP ARTHROPLASTY Left    TOTAL HIP ARTHROPLASTY Right 2015   UPPER EXTREMITY ANGIOGRAPHY Left 11/23/2021   Procedure: UPPER EXTREMITY ANGIOGRAPHY;  Surgeon: Algernon Huxley, MD;  Location: [facility];  Service: Cardiovascular;  Laterality: Left;    Family History  Problem Relation Age of Onset   Stroke Mother    Hypertension Mother    Diabetes Mother    Hypertension Father    Heart disease Sister        MI   Multiple sclerosis Daughter    Multiple sclerosis Son    Cerebral aneurysm Son    Seizures Son    Cerebral aneurysm Son    Breast cancer Paternal Aunt 62    Allergies  Allergen Reactions   Citalopram     Throat closing    Dilaudid [Hydromorphone Hcl] Nausea And Vomiting   Hydrochlorothiazide Other (See Comments)    Decreased GFR (Nov 2015)   Nsaids     CKD stage III - avoid nephrotoxic drugs   Nubain [Nalbuphine Hcl]     Burning sensation in back   Penicillins Itching   Prasugrel Itching   Statins Itching    CBC Latest Ref Rng & Units 09/22/2021 06/13/2021 03/06/2021  WBC  4.0 - 10.5 K/uL 10.2 6.2 5.1  Hemoglobin 12.0 - 15.0 g/dL 11.8(L) 13.2 11.4(L)  Hematocrit 36.0 - 46.0 % 34.5(L) 38.9 34.2(L)  Platelets 150 - 400 K/uL 215 264 236      CMP     Component Value Date/Time   NA 131 (L) 09/22/2021 1339   NA 139 09/13/2014 0000   NA 134 (L) 04/12/2014 3903  K 3.6 09/22/2021 1339   K 4.4 04/12/2014 0558   CL 98 09/22/2021 1339   CL 101 04/12/2014 0558   CO2 25 09/22/2021 1339   CO2 27 04/12/2014 0558   GLUCOSE 122 (H) 09/22/2021 1339   GLUCOSE 90 04/12/2014 0558   BUN 19 11/23/2021 1454   BUN 18 09/13/2014 0000   BUN 16 04/12/2014 0558   CREATININE 1.40 (H) 11/23/2021 1454   CREATININE 1.12 04/12/2014 0558   CALCIUM 9.3 09/22/2021 1339   CALCIUM 8.5 04/12/2014 0558   PROT 7.8 09/22/2021 1339   PROT 8.2 01/30/2012 0118   ALBUMIN 3.9 09/22/2021 1339   ALBUMIN 3.9 01/30/2012 0118   AST 43 (H) 09/22/2021 1339   AST 31 01/30/2012 0118   ALT 38 09/22/2021 1339   ALT 22 01/30/2012 0118   ALKPHOS 76 09/22/2021 1339   ALKPHOS 79 01/30/2012 0118   BILITOT 0.6 09/22/2021 1339   BILITOT 0.5 01/30/2012 0118   GFRNONAA 38 (L) 11/23/2021 1454   GFRNONAA 49 (L) 04/12/2014 0558   GFRAA 50 (L) 03/10/2020 1311   GFRAA 57 (L) 04/12/2014 0558     VAS [place] ABI WITH/WO TBI  Result Date: 12/24/2021  LOWER EXTREMITY DOPPLER STUDY Patient Name:  Latroya A Weller  Date of Exam:   12/23/2021 Medical Rec #: [id number]           Accession #:    [id number] Date of Birth: [birth year]           Patient Gender: F Patient Age:   89 years Exam Location:  [facility] Procedure:      VAS [place] ABI WITH/WO TBI Referring Phys: Arna Medici Murat Rideout --------------------------------------------------------------------------------  Indications: Peripheral artery disease.  Vascular Interventions: Bilateral renal artery stents on 2015;. Performing Technologist: Blondell Reveal RT, RDMS, RVT  Examination Guidelines: A complete evaluation includes at minimum, Doppler waveform signals and  systolic blood pressure reading at the level of bilateral brachial, anterior tibial, and posterior tibial arteries, when vessel segments are accessible. Bilateral testing is considered an integral part of a complete examination. Photoelectric Plethysmograph (PPG) waveforms and toe systolic pressure readings are included as required and additional duplex testing as needed. Limited examinations for reoccurring indications may be performed as noted.  ABI Findings: +---------+------------------+-----+----------+--------+  Right     Rt Pressure (mmHg) Index Waveform   Comment   +---------+------------------+-----+----------+--------+  Brachial  205                                           +---------+------------------+-----+----------+--------+  ATA       121                0.57  monophasic           +---------+------------------+-----+----------+--------+  PTA       123                0.57  monophasic           +---------+------------------+-----+----------+--------+  Great Toe 94                 0.44  Abnormal             +---------+------------------+-----+----------+--------+ +---------+------------------+-----+----------+-------+  Left      Lt Pressure (mmHg) Index Waveform   Comment  +---------+------------------+-----+----------+-------+  Brachial  214                                          +---------+------------------+-----+----------+-------+  ATA       109                0.51  monophasic          +---------+------------------+-----+----------+-------+  PTA                                absent              +---------+------------------+-----+----------+-------+  PERO      114                0.56  monophasic          +---------+------------------+-----+----------+-------+  Great Toe 80                 0.37  Abnormal            +---------+------------------+-----+----------+-------+ +-------+-----------+-----------+------------+------------+  ABI/TBI Today's ABI Today's TBI Previous ABI Previous TBI   +-------+-----------+-----------+------------+------------+  Right   0.57        0.44        0.77         0.38          +-------+-----------+-----------+------------+------------+  Left    0.56        0.37        0.59         0.30          +-------+-----------+-----------+------------+------------+ Right ABIs appear decreased compared to prior study on 10/02/20. Left ABIs appear essentially unchanged.  Summary: Right: Resting right ankle-brachial index indicates moderate right lower extremity arterial disease. The right toe-brachial index is abnormal. Left: Resting left ankle-brachial index indicates moderate left lower extremity arterial disease. The left toe-brachial index is abnormal.  *See table(s) above for measurements and observations.  Electronically signed by Hortencia Pilar MD on 12/24/2021 at 4:01:51 PM.    Final        Assessment & Plan:   1. Subclavian artery stenosis, left (HCC) Today noninvasive studies show that there is a patent stent post placement in the left subclavian artery.  The symptoms the patient was experiencing previously have all resolved including the chest pain and fatigue.  However she has notable other vascular issues that need to be addressed at this time, as noted below.  Patient will continue with Plavix and aspirin  2. Atherosclerosis of renal artery ([place]) The patient does have notable hypertension.  We will perform a renal artery duplex at the patient's follow-up in the next week or so to determine if this could be a cause of her hypertension.  3. Bilateral carotid artery stenosis Patient has a known right ICA occlusion.  We have not checked her carotid in over a year.  We will also check the studies at follow-up.  No medication changes at this time  4. Peripheral vascular disease ([place]) The patient does note significant increase in claudication-like symptoms.  We will perform noninvasive studies at her follow-up.   Current Outpatient Medications on File Prior to  Visit  Medication Sig Dispense Refill   albuterol (VENTOLIN HFA) 108 (90 Base) MCG/ACT inhaler Inhale 2 puffs into the lungs every 6 (six) hours as needed for wheezing or shortness of breath. (Patient not taking: Reported on 12/23/2021) 8 g 2   aspirin 81 MG tablet Take 81 mg by mouth daily.     butalbital-acetaminophen-caffeine (FIORICET) 50-325-40 MG tablet Take 1 tablet by mouth every 6 (six) hours as needed for headache. [place]  tablet 1   chlordiazePOXIDE (LIBRIUM) 25 MG capsule TAKE 1 CAPSULE BY MOUTH ONCE DAILY AS NEEDED FOR ANXIETY (Patient not taking: Reported on 12/23/2021) 30 capsule 5   chlorpheniramine-HYDROcodone (TUSSIONEX PENNKINETIC ER) 10-8 MG/5ML SUER Take 5 mLs by mouth every 12 (twelve) hours as needed. 140 mL 0   clopidogrel (PLAVIX) 75 MG tablet Take 1 tablet (75 mg total) by mouth daily. To prevent strokes 90 tablet 1   ezetimibe (ZETIA) 10 MG tablet Take 1 tablet (10 mg total) by mouth daily. 90 tablet 1   hydrALAZINE (APRESOLINE) 50 MG tablet Take 1.5 tablets (75 mg total) by mouth 3 (three) times daily. Marland KitchenNOTE DOSE INCREASE TO 75 MG (Patient taking differently: Take 75 mg by mouth 3 (three) times daily. Marland KitchenNOTE DOSE INCREASE TO 75 MG pt reports prn for sbp greater than 150) 405 tablet 1   isosorbide mononitrate (IMDUR) 30 MG 24 hr tablet Take 30 mg by mouth daily.     labetalol (NORMODYNE) 300 MG tablet Take 1 tablet (300 mg total) by mouth 2 (two) times daily. For hypertension 180 tablet 1   liothyronine (CYTOMEL) 5 MCG tablet Take 1 tablet (5 mcg total) by mouth daily. For underactive thyroid 90 tablet 1   nortriptyline (PAMELOR) 10 MG capsule Take 1 capsule (10 mg total) by mouth 2 (two) times daily. For chronic headaches 180 capsule 1   Omega 3 1000 MG CAPS Take 1,000 mg by mouth 2 (two) times daily.     pantoprazole (PROTONIX) 40 MG tablet Take 1 tablet (40 mg total) by mouth daily. 30 tablet 5   ranolazine (RANEXA) 500 MG 12 hr tablet Take 500 mg by mouth 2 (two) times daily.      valsartan (DIOVAN) 320 MG tablet Take 1 tablet (320 mg total) by mouth daily. 90 tablet 0   No current facility-administered medications on file prior to visit.    There are no Patient Instructions on file for this visit. No follow-ups on file.   Kris Hartmann, NP

## 2021-12-28 ENCOUNTER — Other Ambulatory Visit: Payer: Self-pay

## 2021-12-28 ENCOUNTER — Encounter (INDEPENDENT_AMBULATORY_CARE_PROVIDER_SITE_OTHER): Payer: Self-pay | Admitting: Nurse Practitioner

## 2021-12-28 MED ORDER — CLOPIDOGREL BISULFATE 75 MG PO TABS: 75.0000 mg | ORAL_TABLET | Freq: Every day | ORAL | 1 refills | Status: DC

## 2021-12-28 MED ORDER — RANOLAZINE ER 500 MG PO TB12: 500.0000 mg | ORAL_TABLET | Freq: Two times a day (BID) | ORAL | 3 refills | Status: DC

## 2021-12-28 MED ORDER — NORTRIPTYLINE HCL 10 MG PO CAPS: 10.0000 mg | ORAL_CAPSULE | Freq: Two times a day (BID) | ORAL | 1 refills | Status: DC

## 2021-12-28 MED ORDER — VALSARTAN 320 MG PO TABS: 320.0000 mg | ORAL_TABLET | Freq: Every day | ORAL | 0 refills | Status: DC

## 2021-12-31 DIAGNOSIS — N1832 Chronic kidney disease, stage 3b: Secondary | ICD-10-CM | POA: Diagnosis not present

## 2021-12-31 DIAGNOSIS — I208 Other forms of angina pectoris: Secondary | ICD-10-CM | POA: Diagnosis not present

## 2021-12-31 DIAGNOSIS — I739 Peripheral vascular disease, unspecified: Secondary | ICD-10-CM | POA: Diagnosis not present

## 2021-12-31 DIAGNOSIS — Z951 Presence of aortocoronary bypass graft: Secondary | ICD-10-CM | POA: Diagnosis not present

## 2021-12-31 DIAGNOSIS — I5032 Chronic diastolic (congestive) heart failure: Secondary | ICD-10-CM | POA: Diagnosis not present

## 2021-12-31 DIAGNOSIS — K219 Gastro-esophageal reflux disease without esophagitis: Secondary | ICD-10-CM | POA: Diagnosis not present

## 2021-12-31 DIAGNOSIS — I050 Rheumatic mitral stenosis: Secondary | ICD-10-CM | POA: Diagnosis not present

## 2021-12-31 DIAGNOSIS — I10 Essential (primary) hypertension: Secondary | ICD-10-CM | POA: Diagnosis not present

## 2021-12-31 DIAGNOSIS — G4733 Obstructive sleep apnea (adult) (pediatric): Secondary | ICD-10-CM | POA: Diagnosis not present

## 2021-12-31 DIAGNOSIS — E782 Mixed hyperlipidemia: Secondary | ICD-10-CM | POA: Diagnosis not present

## 2021-12-31 DIAGNOSIS — I350 Nonrheumatic aortic (valve) stenosis: Secondary | ICD-10-CM | POA: Diagnosis not present

## 2021-12-31 DIAGNOSIS — I2510 Atherosclerotic heart disease of native coronary artery without angina pectoris: Secondary | ICD-10-CM | POA: Diagnosis not present

## 2022-01-02 ENCOUNTER — Encounter: Payer: Self-pay | Admitting: Internal Medicine

## 2022-01-04 ENCOUNTER — Encounter (INDEPENDENT_AMBULATORY_CARE_PROVIDER_SITE_OTHER): Payer: Self-pay | Admitting: Nurse Practitioner

## 2022-01-04 ENCOUNTER — Encounter: Payer: Self-pay | Admitting: Internal Medicine

## 2022-01-04 ENCOUNTER — Other Ambulatory Visit: Payer: Self-pay

## 2022-01-04 DIAGNOSIS — K219 Gastro-esophageal reflux disease without esophagitis: Secondary | ICD-10-CM

## 2022-01-04 DIAGNOSIS — I130 Hypertensive heart and chronic kidney disease with heart failure and stage 1 through stage 4 chronic kidney disease, or unspecified chronic kidney disease: Secondary | ICD-10-CM

## 2022-01-04 DIAGNOSIS — N183 Chronic kidney disease, stage 3 unspecified: Secondary | ICD-10-CM

## 2022-01-04 DIAGNOSIS — I10 Essential (primary) hypertension: Secondary | ICD-10-CM

## 2022-01-04 MED ORDER — CLOPIDOGREL BISULFATE 75 MG PO TABS: 75.0000 mg | ORAL_TABLET | Freq: Every day | ORAL | 1 refills | Status: DC

## 2022-01-04 MED ORDER — PANTOPRAZOLE SODIUM 40 MG PO TBEC: 40.0000 mg | DELAYED_RELEASE_TABLET | Freq: Every day | ORAL | 5 refills | Status: DC

## 2022-01-06 ENCOUNTER — Encounter: Payer: Self-pay | Admitting: Vascular Surgery

## 2022-01-06 ENCOUNTER — Encounter
Admission: RE | Disposition: A | Discharge status: Home / Self Care | Payer: Self-pay | Source: Ambulatory Visit | Attending: Vascular Surgery

## 2022-01-06 ENCOUNTER — Other Ambulatory Visit: Payer: Self-pay

## 2022-01-06 ENCOUNTER — Ambulatory Visit
Admission: RE | Admit: 2022-01-06 | Discharge: 2022-01-06 | Disposition: A | Discharge status: Home / Self Care | Payer: Medicare Other | Source: Ambulatory Visit | Attending: Vascular Surgery | Admitting: Vascular Surgery

## 2022-01-06 DIAGNOSIS — I6523 Occlusion and stenosis of bilateral carotid arteries: Secondary | ICD-10-CM | POA: Insufficient documentation

## 2022-01-06 DIAGNOSIS — Z7982 Long term (current) use of aspirin: Secondary | ICD-10-CM | POA: Insufficient documentation

## 2022-01-06 DIAGNOSIS — I714 Abdominal aortic aneurysm, without rupture, unspecified: Secondary | ICD-10-CM

## 2022-01-06 DIAGNOSIS — I700 Atherosclerosis of aorta: Secondary | ICD-10-CM | POA: Diagnosis not present

## 2022-01-06 DIAGNOSIS — I7092 Chronic total occlusion of artery of the extremities: Secondary | ICD-10-CM

## 2022-01-06 DIAGNOSIS — E785 Hyperlipidemia, unspecified: Secondary | ICD-10-CM | POA: Diagnosis not present

## 2022-01-06 DIAGNOSIS — N183 Chronic kidney disease, stage 3 unspecified: Secondary | ICD-10-CM | POA: Insufficient documentation

## 2022-01-06 DIAGNOSIS — I70211 Atherosclerosis of native arteries of extremities with intermittent claudication, right leg: Secondary | ICD-10-CM

## 2022-01-06 DIAGNOSIS — I70223 Atherosclerosis of native arteries of extremities with rest pain, bilateral legs: Secondary | ICD-10-CM | POA: Insufficient documentation

## 2022-01-06 DIAGNOSIS — Z79899 Other long term (current) drug therapy: Secondary | ICD-10-CM | POA: Insufficient documentation

## 2022-01-06 DIAGNOSIS — Z87891 Personal history of nicotine dependence: Secondary | ICD-10-CM | POA: Diagnosis not present

## 2022-01-06 DIAGNOSIS — D631 Anemia in chronic kidney disease: Secondary | ICD-10-CM | POA: Insufficient documentation

## 2022-01-06 DIAGNOSIS — I70219 Atherosclerosis of native arteries of extremities with intermittent claudication, unspecified extremity: Secondary | ICD-10-CM

## 2022-01-06 DIAGNOSIS — I129 Hypertensive chronic kidney disease with stage 1 through stage 4 chronic kidney disease, or unspecified chronic kidney disease: Secondary | ICD-10-CM | POA: Insufficient documentation

## 2022-01-06 DIAGNOSIS — I701 Atherosclerosis of renal artery: Secondary | ICD-10-CM | POA: Diagnosis not present

## 2022-01-06 DIAGNOSIS — I2510 Atherosclerotic heart disease of native coronary artery without angina pectoris: Secondary | ICD-10-CM | POA: Insufficient documentation

## 2022-01-06 DIAGNOSIS — I771 Stricture of artery: Secondary | ICD-10-CM | POA: Diagnosis not present

## 2022-01-06 HISTORY — PX: LOWER EXTREMITY ANGIOGRAPHY: CATH118251

## 2022-01-06 LAB — CREATININE, SERUM
Creatinine, Ser: 1.82 mg/dL — ABNORMAL HIGH (ref 0.44–1.00)
GFR, Estimated: 28 mL/min — ABNORMAL LOW (ref >60)

## 2022-01-06 LAB — BUN: BUN: 29 mg/dL — ABNORMAL HIGH (ref 8–23)

## 2022-01-06 SURGERY — LOWER EXTREMITY ANGIOGRAPHY
Anesthesia: Moderate Sedation | Site: Leg Lower | Laterality: Right

## 2022-01-06 MED ORDER — CLINDAMYCIN PHOSPHATE 300 MG/50ML IV SOLN
INTRAVENOUS | Status: AC
Start: 2022-01-06 — End: 2022-01-06
  Administered 2022-01-06: 300 mg via INTRAVENOUS
  Filled 2022-01-06: qty 50

## 2022-01-06 MED ORDER — MIDAZOLAM HCL 2 MG/2ML IJ SOLN
INTRAMUSCULAR | Status: DC | PRN
  Administered 2022-01-06: 1 mg via INTRAVENOUS
  Administered 2022-01-06 (×2): .5 mg via INTRAVENOUS
  Administered 2022-01-06: 2 mg via INTRAVENOUS
  Administered 2022-01-06: 1 mg via INTRAVENOUS

## 2022-01-06 MED ORDER — SODIUM CHLORIDE 0.9% FLUSH: 3.0000 mL | INTRAVENOUS | Status: DC | PRN

## 2022-01-06 MED ORDER — ONDANSETRON HCL 4 MG/2ML IJ SOLN: 4.0000 mg | Freq: Four times a day (QID) | INTRAMUSCULAR | Status: DC | PRN

## 2022-01-06 MED ORDER — SODIUM CHLORIDE 0.9 % IV SOLN: INTRAVENOUS | Status: DC

## 2022-01-06 MED ORDER — MIDAZOLAM HCL 5 MG/5ML IJ SOLN
INTRAMUSCULAR | Status: AC
  Filled 2022-01-06: qty 5

## 2022-01-06 MED ORDER — MORPHINE SULFATE (PF) 2 MG/ML IV SOLN
INTRAVENOUS | Status: DC
Start: 2022-01-06 — End: 2022-01-06
  Filled 2022-01-06: qty 1

## 2022-01-06 MED ORDER — MORPHINE SULFATE (PF) 4 MG/ML IV SOLN
2.0000 mg | INTRAVENOUS | Status: DC | PRN
  Administered 2022-01-06: 2 mg via INTRAVENOUS

## 2022-01-06 MED ORDER — HYDRALAZINE HCL 20 MG/ML IJ SOLN: 5.0000 mg | INTRAMUSCULAR | Status: DC | PRN

## 2022-01-06 MED ORDER — MIDAZOLAM HCL 2 MG/ML PO SYRP
ORAL_SOLUTION | ORAL | Status: AC
  Filled 2022-01-06: qty 4

## 2022-01-06 MED ORDER — CLINDAMYCIN PHOSPHATE 300 MG/50ML IV SOLN: 300.0000 mg | Freq: Once | INTRAVENOUS | Status: AC

## 2022-01-06 MED ORDER — METHYLPREDNISOLONE SODIUM SUCC 125 MG IJ SOLR: 125.0000 mg | Freq: Once | INTRAMUSCULAR | Status: DC | PRN

## 2022-01-06 MED ORDER — SODIUM CHLORIDE 0.9% FLUSH: 3.0000 mL | Freq: Two times a day (BID) | INTRAVENOUS | Status: DC

## 2022-01-06 MED ORDER — FENTANYL CITRATE (PF) 100 MCG/2ML IJ SOLN
INTRAMUSCULAR | Status: DC | PRN
  Administered 2022-01-06: 25 ug via INTRAVENOUS
  Administered 2022-01-06: 50 ug via INTRAVENOUS
  Administered 2022-01-06 (×3): 25 ug via INTRAVENOUS

## 2022-01-06 MED ORDER — LABETALOL HCL 5 MG/ML IV SOLN: 10.0000 mg | INTRAVENOUS | Status: DC | PRN

## 2022-01-06 MED ORDER — FENTANYL CITRATE (PF) 100 MCG/2ML IJ SOLN: 12.5000 ug | Freq: Once | INTRAMUSCULAR | Status: DC | PRN

## 2022-01-06 MED ORDER — FAMOTIDINE 20 MG PO TABS: 40.0000 mg | ORAL_TABLET | Freq: Once | ORAL | Status: DC | PRN

## 2022-01-06 MED ORDER — OXYCODONE HCL 5 MG PO TABS: 5.0000 mg | ORAL_TABLET | ORAL | Status: DC | PRN

## 2022-01-06 MED ORDER — HEPARIN SODIUM (PORCINE) 1000 UNIT/ML IJ SOLN
INTRAMUSCULAR | Status: AC
  Filled 2022-01-06: qty 10

## 2022-01-06 MED ORDER — DIPHENHYDRAMINE HCL 50 MG/ML IJ SOLN: 50.0000 mg | Freq: Once | INTRAMUSCULAR | Status: DC | PRN

## 2022-01-06 MED ORDER — IODIXANOL 320 MG/ML IV SOLN
INTRAVENOUS | Status: DC | PRN
  Administered 2022-01-06: 60 mL via INTRA_ARTERIAL

## 2022-01-06 MED ORDER — FENTANYL CITRATE PF 50 MCG/ML IJ SOSY
PREFILLED_SYRINGE | INTRAMUSCULAR | Status: AC
  Filled 2022-01-06: qty 2

## 2022-01-06 MED ORDER — FENTANYL CITRATE PF 50 MCG/ML IJ SOSY
PREFILLED_SYRINGE | INTRAMUSCULAR | Status: AC
  Filled 2022-01-06: qty 1

## 2022-01-06 MED ORDER — SODIUM CHLORIDE 0.9 % IV SOLN: 250.0000 mL | INTRAVENOUS | Status: DC | PRN

## 2022-01-06 MED ORDER — MIDAZOLAM HCL 2 MG/ML PO SYRP
8.0000 mg | ORAL_SOLUTION | Freq: Once | ORAL | Status: AC | PRN
  Administered 2022-01-06: 8 mg via ORAL

## 2022-01-06 MED ORDER — ACETAMINOPHEN 325 MG PO TABS: 650.0000 mg | ORAL_TABLET | ORAL | Status: DC | PRN

## 2022-01-06 SURGICAL SUPPLY — 15 items
CANNULA 5F STIFF (CANNULA) ×1 IMPLANT
CATH ANGIO 5F PIGTAIL 65CM (CATHETERS) ×1 IMPLANT
COVER DRAPE FLUORO 36X44 (DRAPES) ×2 IMPLANT
COVER PROBE U/S 5X48 (MISCELLANEOUS) ×1 IMPLANT
DEVICE STARCLOSE SE CLOSURE (Vascular Products) ×1 IMPLANT
GLIDEWIRE ADV .035X260CM (WIRE) ×1 IMPLANT
GUIDEWIRE SUPER STIFF .035X180 (WIRE) ×1 IMPLANT
PACK ANGIOGRAPHY (CUSTOM PROCEDURE TRAY) ×4 IMPLANT
SHEATH ANL2 6FRX45 HC (SHEATH) ×1 IMPLANT
SHEATH BRITE TIP 5FRX11 (SHEATH) ×1 IMPLANT
SHEATH BRITE TIP 6FRX11 (SHEATH) ×1 IMPLANT
SYR MEDRAD MARK 7 150ML (SYRINGE) ×1 IMPLANT
TUBING CONTRAST HIGH PRESS 72 (TUBING) ×1 IMPLANT
WIRE GUIDERIGHT .035X150 (WIRE) ×1 IMPLANT
WIRE NITINOL .018 (WIRE) ×1 IMPLANT

## 2022-01-06 NOTE — Op Note (Signed)
Sabrina [name] ? Percutaneous Study/Intervention Procedural Note ? ? ?Date of Surgery: 01/06/2022,1:31 PM ? ?Surgeon:Sabrina [name], Sabrina [name]  ? ?Pre-operative Diagnosis: Atherosclerotic occlusive disease bilateral lower extremities with lifestyle limiting claudication and rest pain symptoms ? ?Post-operative diagnosis:  Same ? ?Procedure(s) Performed: ? 1.  Abdominal aortogram ? 2.  Bilateral lower extremity distal runoff ? 3.  Introduction catheter right lower extremity third order catheter placement ? 4.  Ultrasound-guided access to the left common femoral ? 5.  StarClose left common femoral ?  ?Anesthesia: Conscious sedation was administered by the interventional radiology RN under my direct supervision. IV Versed plus fentanyl were utilized. Continuous ECG, pulse oximetry and blood pressure was monitored throughout the entire procedure.  Conscious sedation was administered for a total of 54 minutes and 49 seconds. ? ?Sheath: 6 [place] Ansell left common femoral retrograde ? ?Contrast: 60 cc  ? ?Fluoroscopy Time: 6.5 minutes ? ?Indications:  The patient presents to [facility] with lifestyle limiting claudication and rest pain symptoms of both lower extremities.  Pedal pulses are nonpalpable bilaterally suggesting atherosclerotic occlusive disease.  The risks and benefits as well as alternative therapies for lower extremity revascularization are reviewed with the patient all questions are answered the patient agrees to proceed.  The patient is therefore undergoing angiography with the hope for intervention for limb salvage. ? ? ?Procedure:  [name] Whitakeris a 81 y.o. female who was identified and appropriate procedural time out was performed.  The patient was then placed supine on the table and prepped and draped in the usual sterile fashion.  Ultrasound was used to evaluate the left common femoral artery.  It was echolucent and pulsatile indicating it is patent .  An ultrasound image was  acquired for the permanent record.  A micropuncture needle was used to access the left common femoral artery under direct ultrasound guidance.  The microwire was then advanced under fluoroscopic guidance without difficulty followed by the micro-sheath.  A 0.035 J wire was advanced without resistance and a 5Fr sheath was placed.   ? ?Pigtail catheter was then advanced to the level of T12 and AP projection of the aorta was obtained. Pigtail catheter was then repositioned to above the bifurcation and bilateral oblique view of the pelvis was obtained. Stiff angled Glidewire and pigtail catheter was then used across the bifurcation and the catheter was positioned in the distal external iliac artery.  RAO of the right groin was then obtained. Wire was reintroduced and negotiated into the SFA and the catheter was advanced into the SFA.  The 5 French 11 cm Pinnacle sheath was then exchanged for a 6 [place] Ansell sheath and distal runoff was then performed.  Subsequently the [facility] sheath was exchanged for a 6 French 11 cm Pinnacle sheath and an LAO view of the left common femoral was obtained.  Distal runoff was then obtained by hand-injection through the left femoral sheath. ? ?After review of the images the catheter was removed over wire and an LAO view of the groin was obtained. StarClose device was deployed without difficulty. ? ? ?Findings:  ? Aortogram: The abdominal aorta is opacified with a bolus injection contrast.  There is profound calcific disease throughout the aorta.  Bilateral renal artery stents are noted they appear to be patent with rapid filling of the distal renal artery vasculature bilaterally however the images of poor quality and there could be moderate in-stent restenosis.  Nephrograms appear normal.  The infrarenal aorta demonstrates mild aneurysmal dilatation.  The aortic bifurcation demonstrates previously placed bilateral common iliac artery stents.  These are patent without hemodynamically  significant in-stent restenosis.  The right external iliac artery is diffusely diseased but appears to be patent without hemodynamically significant stenosis.  The left external iliac artery is patent however the previously placed stent in the midportion is free of hemodynamically significant stenosis but beginning just distal to the stent and extending to the level of the ileal inguinal ligament there is diffuse calcific stenosis with several areas that are greater than 80%. ? Right Lower Extremity: The right common femoral demonstrates severe calcific disease with a greater than 80% stenosis in its midportion.  The ostia of the profunda femoris demonstrates a greater than 90% stenosis the superficial femoral artery is a flush occlusion.  Distal to the ostial stenosis of the profunda femoris appears to be diffusely diseased but widely patent and there are numerous very large very tortuous collaterals everywhere.  The superficial femoral artery is occluded from its origin through its entire length as is the popliteal.  There appears to be reconstitution of the anterior tibial near its origin but there is proximal disease in the anterior tibial.  Below this level anterior tibial appears to be patent and the dominant runoff to the foot filling the dorsalis pedis.  The posterior tibial and peroneal are reconstituted in the mid to distal portion but they are quite small and contribute little to the flow to the foot. ? Left Lower Extremity: The left common femoral demonstrates a greater than 80% focal stenosis which is eccentric just below the circumflex branches and then approximately 10 to 15 mm distal to this lesion there is a complete occlusion of the common femoral artery.  This occlusion extends into the profunda femoris for 2 to 3 cm at which point the profunda femoris is reconstituted.  Is diffusely diseased but does not appear to have hemodynamically significant stenoses in its mid to distal portion similar to  the right side.  It also has numerous collaterals which are quite extensive and very tortuous.  The superficial femoral artery is occluded from its origin down to the level of Hunter's canal.  At this level the above-knee popliteal is reconstituted and appears patent down to the level of the tibial plateau.  Distal to this filling is very poor I really do not see true reconstitution of the distal popliteal there is reconstitution of the anterior tibial which does appear patent down to the ankle. ? ? ?Summary: ?The patient has profound atherosclerotic occlusive disease.  Given the burden of disease in the common femoral arteries and at the femoral bifurcation I believe the best treatment would be bilateral femoral endarterectomies with consideration for revascularization of the SFA and trifurcation but not necessary given the patient does not have any tissue loss at this time.  The left external iliac artery would have to be treated at that time as well. ? ?NB: The patient's blood pressure has been very poorly controlled the images within the stents is very poor and so to exclude significant in-stent restenosis is not feasible based on this exam.  Further considerations for work-up whether with other modalities or a follow-up angiogram should be considered. ? ?Disposition: Patient was taken to the recovery room in stable condition having tolerated the procedure well. ? ?Hortencia Pilar ?01/06/2022,1:31 PM  ?

## 2022-01-06 NOTE — Progress Notes (Signed)
Pt. C/o "8"/10 pain to left groin. States "it's a constant aching." Pt. Med. With Morphine 2 mg slow IVP now for c/o. ?

## 2022-01-06 NOTE — Progress Notes (Signed)
Dr. Lucky Cowboy in at bedside to speak with pt. And her sister-in-law re: procedural results. Both verbalize understanding of conversation & need for surgery.  ?

## 2022-01-06 NOTE — Interval H&P Note (Signed)
History and Physical Interval Note: ? ?01/06/2022 ?12:18 PM ? ?Sabrina [name]  has presented today for surgery, with the diagnosis of RLE Angio  BARD   ASO w claudication.  The various methods of treatment have been discussed with the patient and family. After consideration of risks, benefits and other options for treatment, the patient has consented to  Procedure(s): ?Lower Extremity Angiography (Right) as a surgical intervention.  The patient's history has been reviewed, patient examined, no change in status, stable for surgery.  I have reviewed the patient's chart and labs.  Questions were answered to the patient's satisfaction.   ? ? ?Sabrina [name] ? ? ?

## 2022-01-07 ENCOUNTER — Telehealth (INDEPENDENT_AMBULATORY_CARE_PROVIDER_SITE_OTHER): Payer: Self-pay

## 2022-01-07 ENCOUNTER — Encounter: Payer: Self-pay | Admitting: Vascular Surgery

## 2022-01-07 NOTE — Telephone Encounter (Signed)
Patient called stating she wanted to speak with you about a consult and wanted you to call her. I asked about what and she kept stating she needed to speak with you and for you to call. Patient would not tell me why. ?

## 2022-01-07 NOTE — Telephone Encounter (Signed)
Pt requested to have [place] call her daughter to discuss possible upcoming surgery.  It was recommended to have daughter come to office visit on 3/30 or she can call by phone to listen to surgery plan from Dr. Delana Meyer directly so that all information heard is correct. Pt good with that plan.

## 2022-01-09 ENCOUNTER — Encounter: Payer: Self-pay | Admitting: Internal Medicine

## 2022-01-11 ENCOUNTER — Other Ambulatory Visit: Payer: Self-pay

## 2022-01-11 MED ORDER — EZETIMIBE 10 MG PO TABS: 10.0000 mg | ORAL_TABLET | Freq: Every day | ORAL | 1 refills | Status: DC

## 2022-01-21 ENCOUNTER — Ambulatory Visit (INDEPENDENT_AMBULATORY_CARE_PROVIDER_SITE_OTHER): Payer: Medicare Other | Admitting: Vascular Surgery

## 2022-01-25 ENCOUNTER — Encounter: Payer: Self-pay | Admitting: Internal Medicine

## 2022-01-25 ENCOUNTER — Ambulatory Visit (INDEPENDENT_AMBULATORY_CARE_PROVIDER_SITE_OTHER): Payer: Medicare Other | Admitting: Nurse Practitioner

## 2022-01-25 ENCOUNTER — Encounter (INDEPENDENT_AMBULATORY_CARE_PROVIDER_SITE_OTHER): Payer: Self-pay | Admitting: Nurse Practitioner

## 2022-01-25 VITALS — BP 198/89 | HR 72 | Resp 17 | Ht 61.0 in | Wt 163.0 lb

## 2022-01-25 DIAGNOSIS — I10 Essential (primary) hypertension: Secondary | ICD-10-CM | POA: Diagnosis not present

## 2022-01-25 DIAGNOSIS — I739 Peripheral vascular disease, unspecified: Secondary | ICD-10-CM | POA: Diagnosis not present

## 2022-01-25 DIAGNOSIS — Z9889 Other specified postprocedural states: Secondary | ICD-10-CM | POA: Diagnosis not present

## 2022-01-25 DIAGNOSIS — E782 Mixed hyperlipidemia: Secondary | ICD-10-CM

## 2022-01-25 DIAGNOSIS — I701 Atherosclerosis of renal artery: Secondary | ICD-10-CM

## 2022-01-26 ENCOUNTER — Other Ambulatory Visit: Payer: Self-pay

## 2022-01-26 MED ORDER — HYDRALAZINE HCL 50 MG PO TABS: 75.0000 mg | ORAL_TABLET | Freq: Three times a day (TID) | ORAL | 1 refills | Status: DC

## 2022-01-28 ENCOUNTER — Encounter: Payer: Self-pay | Admitting: Internal Medicine

## 2022-01-28 ENCOUNTER — Ambulatory Visit (INDEPENDENT_AMBULATORY_CARE_PROVIDER_SITE_OTHER): Payer: Medicare Other | Admitting: Internal Medicine

## 2022-01-28 VITALS — BP 162/78 | HR 68 | Temp 98.2°F | Ht 61.0 in | Wt 161.4 lb

## 2022-01-28 DIAGNOSIS — I130 Hypertensive heart and chronic kidney disease with heart failure and stage 1 through stage 4 chronic kidney disease, or unspecified chronic kidney disease: Secondary | ICD-10-CM

## 2022-01-28 DIAGNOSIS — E782 Mixed hyperlipidemia: Secondary | ICD-10-CM

## 2022-01-28 DIAGNOSIS — R590 Localized enlarged lymph nodes: Secondary | ICD-10-CM | POA: Insufficient documentation

## 2022-01-28 DIAGNOSIS — R0683 Snoring: Secondary | ICD-10-CM | POA: Diagnosis not present

## 2022-01-28 DIAGNOSIS — G4459 Other complicated headache syndrome: Secondary | ICD-10-CM | POA: Diagnosis not present

## 2022-01-28 DIAGNOSIS — N185 Chronic kidney disease, stage 5: Secondary | ICD-10-CM | POA: Diagnosis not present

## 2022-01-28 DIAGNOSIS — I120 Hypertensive chronic kidney disease with stage 5 chronic kidney disease or end stage renal disease: Secondary | ICD-10-CM | POA: Diagnosis not present

## 2022-01-28 DIAGNOSIS — I739 Peripheral vascular disease, unspecified: Secondary | ICD-10-CM | POA: Diagnosis not present

## 2022-01-28 DIAGNOSIS — R519 Headache, unspecified: Secondary | ICD-10-CM | POA: Diagnosis not present

## 2022-01-28 DIAGNOSIS — I701 Atherosclerosis of renal artery: Secondary | ICD-10-CM

## 2022-01-28 DIAGNOSIS — G4710 Hypersomnia, unspecified: Secondary | ICD-10-CM

## 2022-01-28 DIAGNOSIS — I10 Essential (primary) hypertension: Secondary | ICD-10-CM | POA: Diagnosis not present

## 2022-01-28 DIAGNOSIS — N183 Chronic kidney disease, stage 3 unspecified: Secondary | ICD-10-CM

## 2022-01-28 DIAGNOSIS — Z1231 Encounter for screening mammogram for malignant neoplasm of breast: Secondary | ICD-10-CM

## 2022-01-28 LAB — BASIC METABOLIC PANEL
BUN: 26 mg/dL — ABNORMAL HIGH (ref 6–23)
CO2: 26 mEq/L (ref 19–32)
Calcium: 9.8 mg/dL (ref 8.4–10.5)
Chloride: 104 mEq/L (ref 96–112)
Creatinine, Ser: 1.68 mg/dL — ABNORMAL HIGH (ref 0.40–1.20)
GFR: 28.66 mL/min — ABNORMAL LOW (ref >60.00)
Glucose, Bld: 75 mg/dL (ref 70–99)
Potassium: 4.2 mEq/L (ref 3.5–5.1)
Sodium: 138 mEq/L (ref 135–145)

## 2022-01-28 LAB — C-REACTIVE PROTEIN: CRP: <1 mg/dL (ref 0.5–20.0)

## 2022-01-28 LAB — SEDIMENTATION RATE: Sed Rate: 62 mm/hr — ABNORMAL HIGH (ref 0–30)

## 2022-01-28 MED ORDER — BUTALBITAL-APAP-CAFFEINE 50-325-40 MG PO TABS: 1.0000 | ORAL_TABLET | ORAL | 1 refills | Status: DC | PRN

## 2022-01-28 MED ORDER — HYDRALAZINE HCL 100 MG PO TABS: 100.0000 mg | ORAL_TABLET | Freq: Three times a day (TID) | ORAL | 1 refills | Status: DC

## 2022-01-28 NOTE — Assessment & Plan Note (Signed)
Not well controlled on valsartan 320 mg ,  Labetalol 300 mg bid and hydralazine 75 mg tid.  Increase hydralazine to 100 mg tid  ?

## 2022-01-28 NOTE — Assessment & Plan Note (Signed)
Present for several months in an ex smoker.  Will Refer to ENT for evaluation and possible biopsy  ?

## 2022-01-28 NOTE — Assessment & Plan Note (Signed)
Likely multifactorial and Occurring for over 5 years. Needs repeat sleep study given uncontrolled hypertension and morning occurrences..  Has seen neurosurgery for ESI without significant relief   Previously unresponsive to nortriptyline.  Previously managed with transdermal fentanyl due to lack of improvement with tylenol #4 and tramadol .  NSAIDS are C/I Due to GFR 28.  Will repeat MRI brain,  Obtain new sleep study and continue fioricet/tramadol and tylenol for pain management.   ?

## 2022-01-28 NOTE — Progress Notes (Signed)
? ?Subjective:  ?Patient ID: Sabrina [name], female    DOB: [birth year]  Age: 81 y.o. MRN: [id number] ? ?CC: The primary encounter diagnosis was Encounter for screening mammogram for malignant neoplasm of breast. 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 ? ? ?This visit occurred during the SARS-CoV-2 public health emergency.  Safety protocols were in place, including screening questions prior to the visit, additional usage of staff PPE, and extensive cleaning of exam room while observing appropriate contact time as indicated for disinfecting solutions.   ? ?HPI ?Zitlaly A Bonito presents for follow up on multiple issues ?Chief Complaint  ?Patient presents with  ? Follow-up  ?  Follow up on headaches   ? ?81 yr old female with extensive PVD s/p  RAS stenting, Bilateral iliac artery stenting, carotid endarterectomy,  CAD  s/p CABG x 3 , mitral and aortic stenosis,  poorly controlled hypertension, statin intolerance ,  and  DDD cervical spinal stenosis  presents with several issues: ?  ?1) headaches  : she has a history of chronic headaches,  with prior neurology workup with MRI brain in 2012  unremarkable and multiple medication trials including nortiptyline failed to improve her pain .  For the last several months she has been using fioricet and tylenol since weaning off of low dose fentanyl transdermal patches.  She states that for the past month the headaches have been occurring nearly daily.  The pain starts in the bilateral temples , spreads to the crown and often radiates to the occiput.  Will often wake up with it .  She is unable to take  the fioricet at night because it keeps her awake, but is using tyleno, which helps but makes her sleepy.  The  fioricet relieves pain for only one hour.   ? ?2) Bilateral swollen glands in her neck.  No recent history of infection , glands are not tender.  Ex smoker.  ? ?3) HTN:  not controlled lately.  Readings at home taken during headaches have been as high as 416 systolic. Taking valsartan 320 mg but thinks it is causing her hairline to recede, hydralazine 75 mg tid, and labetalol 300 mg bid.  ? ? ? ? ?Outpatient Medications Prior to Visit  ?Medication Sig Dispense Refill  ? aspirin 81 MG tablet Take 81 mg by mouth daily.    ? clopidogrel (PLAVIX) 75 MG tablet Take 1 tablet (75 mg total) by mouth daily. To prevent strokes 90 tablet 1  ? ezetimibe (ZETIA) 10 MG tablet Take 1 tablet (10 mg total) by mouth daily. 90 tablet 1  ? labetalol (NORMODYNE) 300 MG tablet Take 1 tablet (300 mg total) by mouth 2 (two) times daily. For hypertension 180 tablet 1  ? nortriptyline (PAMELOR) 10 MG capsule Take 1 capsule (10 mg total) by mouth 2 (two) times daily. For chronic headaches 180 capsule 1  ? Omega 3 1000 MG CAPS Take 1,000 mg by mouth 2 (two) times daily.    ? pantoprazole (PROTONIX) 40 MG tablet Take 1 tablet (40 mg total) by mouth daily. 30 tablet 5  ? ranolazine (RANEXA) 500 MG 12 hr tablet Take 1 tablet (500 mg total) by mouth 2 (two) times daily. 60 tablet 3  ?  valsartan (DIOVAN) 320 MG tablet Take 1 tablet (320 mg total) by mouth daily. 90 tablet 0  ? butalbital-acetaminophen-caffeine (FIORICET) 50-325-40 MG tablet Take 1 tablet by mouth every 6 (six) hours as needed for headache. 120 tablet 1  ? hydrALAZINE (APRESOLINE) 50 MG tablet Take 1.5 tablets (75 mg total) by mouth 3 (three) times daily. Marland KitchenNOTE DOSE INCREASE TO 75 MG 405 tablet 1  ? ?No facility-administered medications prior to visit.  ? ? ?Review of Systems; ? ?Patient denies headache, fevers, malaise, unintentional weight loss, skin rash, eye pain, sinus  congestion and sinus pain, sore throat, dysphagia,  hemoptysis , cough, dyspnea, wheezing, chest pain, palpitations, orthopnea, edema, abdominal pain, nausea, melena, diarrhea, constipation, flank pain, dysuria, hematuria, urinary  Frequency, nocturia, numbness, tingling, seizures,  Focal weakness, Loss of consciousness,  Tremor, insomnia, depression, anxiety, and suicidal ideation.   ? ? ? ?Objective:  ?BP (!) 162/78 (BP Location: Right Arm, Patient Position: Sitting, Cuff Size: Normal)   Pulse 68   Temp 98.2 ?F (36.8 ?C) (Oral)   Ht 5\' 1"  (1.549 m)   Wt 161 lb 6.4 oz (73.2 kg)   SpO2 97%   BMI 30.50 kg/m?  ? ?BP Readings from Last 3 Encounters:  ?01/28/22 (!) 162/78  ?01/25/22 (!) 198/89  ?01/06/22 (!) 159/58  ? ? ?Wt Readings from Last 3 Encounters:  ?01/28/22 161 lb 6.4 oz (73.2 kg)  ?01/25/22 163 lb (73.9 kg)  ?01/06/22 159 lb 3.2 oz (72.2 kg)  ? ? ?General appearance: alert, cooperative and appears stated age ?Ears: normal TM's and external ear canals both ears ?Throat: lips, mucosa, and tongue normal; teeth and gums normal ?Neck: no adenopathy, no carotid bruit, supple, symmetrical, trachea midline and thyroid not enlarged, symmetric, no tenderness/mass/nodules ?Back: symmetric, no curvature. ROM normal. No CVA tenderness. ?Lungs: clear to auscultation bilaterally ?Heart: regular rate and rhythm, S1, S2 normal, no murmur, click, rub or gallop ?Abdomen: soft, non-tender; bowel sounds normal; no masses,  no organomegaly ?Pulses: 2+ and symmetric ?Skin: Skin color, texture, turgor normal. No rashes or lesions ?Lymph nodes: Cervical, supraclavicular, and axillary nodes normal. ? ?Lab Results  ?Component Value Date  ? HGBA1C 5.9 07/19/2016  ? HGBA1C 5.8 06/02/2015  ? ? ?Lab Results  ?Component Value Date  ? CREATININE 1.68 (H) 01/28/2022  ? CREATININE 1.82 (H) 01/06/2022  ? CREATININE 1.40 (H) 11/23/2021  ? ? ?Lab Results  ?Component Value Date  ? WBC 10.2 09/22/2021  ? HGB 11.8 (L) 09/22/2021  ? HCT 34.5  (L) 09/22/2021  ? PLT 215 09/22/2021  ? GLUCOSE 75 01/28/2022  ? CHOL 205 (H) 07/29/2020  ? TRIG 131.0 07/29/2020  ? HDL 49.10 07/29/2020  ? LDLDIRECT 109.0 03/05/2016  ? LDLCALC 130 (H) 07/29/2020  ? ALT 38 09/22/2021  ? AST 43 (H) 09/22/2021  ? NA 138 01/28/2022  ? K 4.2 01/28/2022  ? CL 104 01/28/2022  ? CREATININE 1.68 (H) 01/28/2022  ? BUN 26 (H) 01/28/2022  ? CO2 26 01/28/2022  ? TSH 7.67 (H) 06/22/2021  ? INR 1.0 03/27/2014  ? HGBA1C 5.9 07/19/2016  ? ? ?PERIPHERAL VASCULAR CATHETERIZATION ? ?Result Date: 01/06/2022 ?See surgical note for result. ? ? ?Assessment & Plan:  ? ?Problem List Items Addressed This Visit   ? ? HLD (hyperlipidemia)  ? Relevant Medications  ? hydrALAZINE (APRESOLINE) 100 MG tablet  ? Other Relevant Orders  ? Lipid Panel w/reflex Direct LDL  ? Peripheral vascular disease ([place])  ? Relevant Medications  ? hydrALAZINE (APRESOLINE)  100 MG tablet  ? Chronic daily headache  ?  Likely multifactorial and Occurring for over 5 years. Needs repeat sleep study given uncontrolled hypertension and morning occurrences..  Has seen neurosurgery for ESI without significant relief   Previously unresponsive to nortriptyline.  Previously managed with transdermal fentanyl due to lack of improvement with tylenol #4 and tramadol .  NSAIDS are C/I Due to GFR 28.  Will repeat MRI brain,  Obtain new sleep study and continue fioricet/tramadol and tylenol for pain management.   ?  ?  ? Relevant Medications  ? butalbital-acetaminophen-caffeine (FIORICET) 50-325-40 MG tablet  ? 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  Not well controlled on valsartan 320 mg ,  Labetalol 300 mg bid and hydralazine 75 mg tid.  Increase hydralazine to 100 mg tid  ?  ?  ? Relevant Medications  ? hydrALAZINE (APRESOLINE) 100 MG tablet  ? LAD (lymphadenopathy), cervical  ?  Present for several months in an ex smoker.  Will Refer to ENT for evaluation and possible biopsy  ?  ?  ? Relevant  Orders  ? Ambulatory referral to ENT  ? ?Other Visit Diagnoses   ? ? Encounter for screening mammogram for malignant neoplasm of breast    -  Primary  ? Relevant Orders  ? MM 3D SCREEN BREAST BILATERAL  ? [place]

## 2022-01-28 NOTE — Patient Instructions (Addendum)
For your headache pain: ? ?Take 1 butalbital and one tramadol every 4 hours as needed for headache. Stop using the butalbital after 2 pm if the caffeine in it keeps you up too late.   You may continue to use  tramadol  and tylenol as needed  after 2 pm   ? ? ?For your blood pressure: ? ?Continue labetalol and valsartan  ?Increase the hydralazine to 100 mg  every 8 hours using the tablet that you have ? ? ?I am ordering Sleep study to rule out sleep apnea ? ?I have ordered an MRI brain to evaluate your worsening headaches ? ?I will make a Referral to ENT for evaluate your lymph nodes in your neck . ? ? ? ? ? ? ? ?

## 2022-01-29 LAB — LIPID PANEL W/REFLEX DIRECT LDL
Cholesterol: 206 mg/dL — ABNORMAL HIGH (ref <200)
HDL: 68 mg/dL (ref >=50)
LDL Cholesterol (Calc): 123 mg/dL (calc) — ABNORMAL HIGH
Non-HDL Cholesterol (Calc): 138 mg/dL (calc) — ABNORMAL HIGH (ref <130)
Total CHOL/HDL Ratio: 3 (calc) (ref <5.0)
Triglycerides: 61 mg/dL (ref <150)

## 2022-01-30 ENCOUNTER — Encounter (INDEPENDENT_AMBULATORY_CARE_PROVIDER_SITE_OTHER): Payer: Self-pay | Admitting: Nurse Practitioner

## 2022-01-30 NOTE — Progress Notes (Signed)
? ?Subjective:  ? ? Patient ID: Sabrina [name], female    DOB: May 29, 1941, 81 y.o.   MRN: [id number] ?No chief complaint on file. ? ? ?Sabrina [name] is a 81 year old female that presents today after recent endovascular intervention.  This intervention was ultimately diagnostic due to extensive lower extremity disease.  The patient has debilitating claudication and some mild rest pain like symptoms.  There are no open wounds or ulcerations.  The previous chest pain that the patient was having is also resolved from previous subclavian stent placement.  The patient has had multiple interventions over the years.  Based on the most recent angiogram the patient should undergo bilateral femoral endarterectomies with left iliac artery stent placement and bilateral SFA stent placement. ? ? ?Review of Systems  ?Cardiovascular:   ?     Claudication  ?Neurological:  Positive for numbness.  ?All other systems reviewed and are negative. ? ?   ?Objective:  ? Physical Exam ?Vitals reviewed.  ?HENT:  ?   Head: Normocephalic.  ?Cardiovascular:  ?   Rate and Rhythm: Normal rate.  ?   Comments: Nonpalpable pedal pulses ?Pulmonary:  ?   Effort: Pulmonary effort is normal.  ?Skin: ?   General: Skin is warm and dry.  ?Neurological:  ?   Mental Status: She is alert and oriented to person, place, and time.  ?Psychiatric:     ?   Mood and Affect: Mood normal.     ?   Behavior: Behavior normal.     ?   Thought Content: Thought content normal.     ?   Judgment: Judgment normal.  ? ? ?BP (!) 198/89 (BP Location: Right Arm)   Pulse 72   Resp 17   Ht 5\' 1"  (1.549 m)   Wt 163 lb (73.9 kg)   BMI 30.80 kg/m?  ? ?Past Medical History:  ?Diagnosis Date  ? Amputation of fifth toe, right, traumatic, subsequent encounter ([place]) 06/18/2019  ? Anemia of chronic kidney failure   ? Anxiety   ? CAD (coronary artery disease)   ? Carotid artery stenosis   ? Chronic kidney disease, stage III (moderate) ([facility])   ? Followed by Dr. Juleen China  ? 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 Hyperlipidemia   ? Hypertension   ? Renal artery stenosis ([place])   ? 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 Spinal stenosis of lumbar region at multiple levels   ? 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 ? ?Social History  ? ?Socioeconomic History  ? Marital status: Divorced  ?  Spouse name: Not on file  ? Number of children: 3  ? Years of education: Not on file  ? Highest education level: Not on file  ?Occupational History  ? Occupation: retired  ?Tobacco Use  ? Smoking status: Former  ?  Types: Cigarettes  ?  Quit date: 10/25/1998  ?  Years since quitting: 23.2  ? Smokeless tobacco: Never  ?Vaping Use  ? Vaping Use: Never used  ?Substance and Sexual Activity  ? Alcohol use: No  ?  Alcohol/week: 0.0 standard drinks  ? Drug use: Never  ? Sexual activity: Not Currently  ?Other Topics Concern  ? Not on file  ?Social History Narrative  ? Daughter [name] ([place]); 1 in [employer]; 1 in [place]  ? ?Social Determinants of Health  ? ?Financial Resource Strain: Not on file  ?Food Insecurity: Not on file  ?Transportation Needs: Not on file  ?Physical Activity: Not on file  ?  Stress: Not on file  ?Social Connections: Not on file  ?Intimate Partner Violence: Not on file  ? ? ?Past Surgical History:  ?Procedure Laterality Date  ? ABDOMINAL HYSTERECTOMY  1976  ? CAROTID ARTERY ANGIOPLASTY Left   ? CAROTID ENDARTERECTOMY Left   ? CHOLECYSTECTOMY    ? COLONOSCOPY WITH PROPOFOL N/A 08/16/2017  ? Procedure: COLONOSCOPY WITH PROPOFOL;  Surgeon: [name], MD;  Location: [facility] ENDOSCOPY;  Service: Endoscopy;  Laterality: N/A;  ? CORONARY ANGIOPLASTY WITH STENT PLACEMENT  2000  ? CORONARY ARTERY BYPASS GRAFT  2000  ? CYSTOSCOPY WITH STENT PLACEMENT Bilateral   ? ESOPHAGOGASTRODUODENOSCOPY (EGD) WITH PROPOFOL N/A 08/16/2017  ? Procedure: ESOPHAGOGASTRODUODENOSCOPY (EGD) WITH PROPOFOL;  Surgeon: [name], MD;  Location: [facility] ENDOSCOPY;  Service: Endoscopy;  Laterality: N/A;  ? ESOPHAGOGASTRODUODENOSCOPY (EGD)  WITH PROPOFOL N/A 06/29/2018  ? Procedure: ESOPHAGOGASTRODUODENOSCOPY (EGD) WITH PROPOFOL;  Surgeon: Virgel Manifold, MD;  Location: [facility] ENDOSCOPY;  Service: Endoscopy;  Laterality: N/A;  ? JOINT REPLACEMENT Bilateral 2000  ? LEFT HEART CATH AND CORONARY ANGIOGRAPHY N/A 11/18/2021  ? Procedure: LEFT HEART CATH AND CORONARY ANGIOGRAPHY;  Surgeon: Yolonda Kida, MD;  Location: [facility] CV LAB;  Service: Cardiovascular;  Laterality: N/A;  ? LOWER EXTREMITY ANGIOGRAPHY Right 01/06/2022  ? Procedure: Lower Extremity Angiography;  Surgeon: Katha Cabal, MD;  Location: [place] CV LAB;  Service: Cardiovascular;  Laterality: Right;  ? RENAL ARTERY ANGIOPLASTY Bilateral mARCH 2015  ? TOE AMPUTATION Right   ? small toe  ? TONSILLECTOMY AND ADENOIDECTOMY    ? TOTAL HIP ARTHROPLASTY Left   ? TOTAL HIP ARTHROPLASTY Right 2015  ? UPPER EXTREMITY ANGIOGRAPHY Left 11/23/2021  ? Procedure: UPPER EXTREMITY ANGIOGRAPHY;  Surgeon: Algernon Huxley, MD;  Location: [place] CV LAB;  Service: Cardiovascular;  Laterality: Left;  ? ? ?Family History  ?Problem Relation Age of Onset  ? Stroke Mother   ? Hypertension Mother   ? Diabetes Mother   ? Hypertension Father   ? Heart disease Sister   ?     MI  ? Multiple sclerosis Daughter   ? Multiple sclerosis Son   ? Cerebral aneurysm Son   ? Seizures Son   ? Cerebral aneurysm Son   ? Breast cancer Paternal Aunt 19  ? ? ?Allergies  ?Allergen Reactions  ? Citalopram Anaphylaxis  ?  Throat closing   ? Dilaudid [Hydromorphone Hcl] Nausea And Vomiting  ? Hydrochlorothiazide Other (See Comments)  ?  Decreased GFR (Nov 2015)  ? Liothyronine   ?  Hair fell out, caused headaches   ? Nsaids   ?  CKD stage III - avoid nephrotoxic drugs  ? Nubain [Nalbuphine Hcl]   ?  Burning sensation in back  ? Penicillins Itching  ? Prasugrel Itching  ? Statins Itching  ? ? ? ?  Latest Ref Rng & Units 09/22/2021  ?  1:39 PM 06/13/2021  ? 12:33 AM 03/06/2021  ? 12:52 PM  ?CBC  ?WBC 4.0 - 10.5 K/uL 10.2    6.2   5.1    ?Hemoglobin 12.0 - 15.0 g/dL 11.8   13.2   11.4    ?Hematocrit 36.0 - 46.0 % 34.5   38.9   34.2    ?Platelets 150 - 400 K/uL 215   264   236    ? ? ? ? ?CMP  ?   ?Component Value Date/Time  ? NA 138 01/28/2022 1146  ? NA 139 09/13/2014 0000  ? NA 134 (L)  04/12/2014 0558  ? K 4.2 01/28/2022 1146  ? K 4.4 04/12/2014 0558  ? CL 104 01/28/2022 1146  ? CL 101 04/12/2014 0558  ? CO2 26 01/28/2022 1146  ? CO2 27 04/12/2014 0558  ? GLUCOSE 75 01/28/2022 1146  ? GLUCOSE 90 04/12/2014 0558  ? BUN 26 (H) 01/28/2022 1146  ? BUN 18 09/13/2014 0000  ? BUN 16 04/12/2014 0558  ? CREATININE 1.68 (H) 01/28/2022 1146  ? CREATININE 1.12 04/12/2014 0558  ? CALCIUM 9.8 01/28/2022 1146  ? CALCIUM 8.5 04/12/2014 0558  ? PROT 7.8 09/22/2021 1339  ? PROT 8.2 01/30/2012 0118  ? ALBUMIN 3.9 09/22/2021 1339  ? ALBUMIN 3.9 01/30/2012 0118  ? AST 43 (H) 09/22/2021 1339  ? AST 31 01/30/2012 0118  ? ALT 38 09/22/2021 1339  ? ALT 22 01/30/2012 0118  ? ALKPHOS 76 09/22/2021 1339  ? ALKPHOS 79 01/30/2012 0118  ? BILITOT 0.6 09/22/2021 1339  ? BILITOT 0.5 01/30/2012 0118  ? GFRNONAA 28 (L) 01/06/2022 1105  ? GFRNONAA 49 (L) 04/12/2014 0558  ? GFRAA 50 (L) 03/10/2020 1311  ? GFRAA 57 (L) 04/12/2014 0558  ? ? ? ?VAS [place] ABI WITH/WO TBI ? ?Result Date: 12/24/2021 ? LOWER EXTREMITY DOPPLER STUDY Patient Name:  Sabrina [name]  Date of Exam:   12/23/2021 Medical Rec #: [id number]           Accession #:    [id number] Date of Birth: [birth year]           Patient Gender: F Patient Age:   74 years Exam Location:  [facility] Procedure:      VAS [place] ABI WITH/WO TBI Referring Phys: Eulogio Ditch --------------------------------------------------------------------------------  Indications: Peripheral artery disease.  Vascular Interventions: Bilateral renal artery stents on 2015;. Performing Technologist: Blondell Reveal RT, RDMS, RVT  Examination Guidelines: A complete evaluation includes at minimum, Doppler waveform signals and systolic blood  pressure reading at the level of bilateral brachial, anterior tibial, and posterior tibial arteries, when vessel segments are accessible. Bilateral testing is considered an integral part of a complete examinati

## 2022-02-01 ENCOUNTER — Telehealth: Payer: Self-pay | Admitting: Internal Medicine

## 2022-02-01 NOTE — Telephone Encounter (Signed)
Lft pt vm to call ofc . Thank you! 

## 2022-02-05 ENCOUNTER — Encounter: Payer: Self-pay | Admitting: Internal Medicine

## 2022-02-05 MED ORDER — TRAMADOL HCL 50 MG PO TABS: 50.0000 mg | ORAL_TABLET | Freq: Four times a day (QID) | ORAL | 5 refills | Status: DC | PRN

## 2022-02-05 NOTE — Telephone Encounter (Signed)
Tramadol refills sent ? ?Request for librium refill denied;  it has not come from me in over 2 years.  And is contraindicated with concurrent use of tramadol . ? ?Regards, ? ? ?Deborra Medina, MD   ? ? ?

## 2022-02-10 ENCOUNTER — Telehealth (INDEPENDENT_AMBULATORY_CARE_PROVIDER_SITE_OTHER): Payer: Self-pay

## 2022-02-10 NOTE — Telephone Encounter (Signed)
Spoke with the patient and she is scheduled with Dr. Delana Meyer for a bilateral femoral endarterectomy, left iliac stent placement and biateral SFA stent placement on 03/17/22 at the [facility]. Pre-op phone call is on 03/10/22 between 8-1 pm. Pre-surgical instructions were discussed and will be mailed. ?

## 2022-02-11 ENCOUNTER — Telehealth: Payer: Self-pay | Admitting: Internal Medicine

## 2022-02-11 ENCOUNTER — Other Ambulatory Visit: Payer: Self-pay | Admitting: Internal Medicine

## 2022-02-11 DIAGNOSIS — R0683 Snoring: Secondary | ICD-10-CM

## 2022-02-11 DIAGNOSIS — R42 Dizziness and giddiness: Secondary | ICD-10-CM

## 2022-02-11 DIAGNOSIS — R519 Headache, unspecified: Secondary | ICD-10-CM

## 2022-02-11 DIAGNOSIS — E669 Obesity, unspecified: Secondary | ICD-10-CM

## 2022-02-11 NOTE — Telephone Encounter (Signed)
[facility] called in stating that they need additional information on the sleep study.... Monica Martinez stated that they need a signed order and the study type..... Gen stated that the provider can fax the information back to [phone]...  ?

## 2022-02-11 NOTE — Telephone Encounter (Signed)
Sabrina [name] stated that they need a signed order and the study type.....  ? ?Not a new referral a referral was sent on 02/01/22. I will send the referral. Thank you! ?

## 2022-02-12 NOTE — Telephone Encounter (Signed)
I have printed the order and placed in quick sign folder.  ?

## 2022-02-16 NOTE — Telephone Encounter (Signed)
faxed

## 2022-02-17 ENCOUNTER — Ambulatory Visit: Payer: Medicare Other

## 2022-02-18 ENCOUNTER — Ambulatory Visit
Admission: RE | Admit: 2022-02-18 | Discharge: 2022-02-18 | Disposition: A | Discharge status: Home / Self Care | Payer: Medicare Other | Source: Ambulatory Visit | Attending: Internal Medicine | Admitting: Internal Medicine

## 2022-02-18 DIAGNOSIS — Z1231 Encounter for screening mammogram for malignant neoplasm of breast: Secondary | ICD-10-CM

## 2022-02-18 DIAGNOSIS — R519 Headache, unspecified: Secondary | ICD-10-CM | POA: Diagnosis not present

## 2022-02-18 DIAGNOSIS — G4459 Other complicated headache syndrome: Secondary | ICD-10-CM | POA: Insufficient documentation

## 2022-02-19 ENCOUNTER — Other Ambulatory Visit: Payer: Self-pay | Admitting: Internal Medicine

## 2022-02-19 DIAGNOSIS — I671 Cerebral aneurysm, nonruptured: Secondary | ICD-10-CM

## 2022-02-25 ENCOUNTER — Encounter: Payer: Self-pay | Admitting: Internal Medicine

## 2022-02-26 ENCOUNTER — Other Ambulatory Visit: Payer: Self-pay

## 2022-02-26 DIAGNOSIS — I10 Essential (primary) hypertension: Secondary | ICD-10-CM

## 2022-02-26 MED ORDER — LABETALOL HCL 300 MG PO TABS: 300.0000 mg | ORAL_TABLET | Freq: Two times a day (BID) | ORAL | 1 refills | Status: DC

## 2022-03-02 ENCOUNTER — Ambulatory Visit
Admission: RE | Admit: 2022-03-02 | Discharge: 2022-03-02 | Disposition: A | Discharge status: Home / Self Care | Payer: Medicare Other | Source: Ambulatory Visit | Attending: Internal Medicine | Admitting: Internal Medicine

## 2022-03-02 DIAGNOSIS — Q283 Other malformations of cerebral vessels: Secondary | ICD-10-CM | POA: Diagnosis not present

## 2022-03-02 DIAGNOSIS — I6502 Occlusion and stenosis of left vertebral artery: Secondary | ICD-10-CM | POA: Diagnosis not present

## 2022-03-02 DIAGNOSIS — I6523 Occlusion and stenosis of bilateral carotid arteries: Secondary | ICD-10-CM | POA: Diagnosis not present

## 2022-03-02 DIAGNOSIS — I6622 Occlusion and stenosis of left posterior cerebral artery: Secondary | ICD-10-CM | POA: Diagnosis not present

## 2022-03-02 DIAGNOSIS — I671 Cerebral aneurysm, nonruptured: Secondary | ICD-10-CM | POA: Diagnosis not present

## 2022-03-03 ENCOUNTER — Other Ambulatory Visit: Payer: Self-pay | Admitting: Internal Medicine

## 2022-03-03 DIAGNOSIS — I671 Cerebral aneurysm, nonruptured: Secondary | ICD-10-CM

## 2022-03-03 DIAGNOSIS — R519 Headache, unspecified: Secondary | ICD-10-CM

## 2022-03-04 ENCOUNTER — Encounter: Payer: Self-pay | Admitting: Internal Medicine

## 2022-03-05 ENCOUNTER — Inpatient Hospital Stay: Payer: Medicare Other

## 2022-03-05 ENCOUNTER — Other Ambulatory Visit: Payer: Self-pay

## 2022-03-05 DIAGNOSIS — I10 Essential (primary) hypertension: Secondary | ICD-10-CM

## 2022-03-05 MED ORDER — LABETALOL HCL 300 MG PO TABS: 300.0000 mg | ORAL_TABLET | Freq: Two times a day (BID) | ORAL | 1 refills | Status: DC

## 2022-03-08 ENCOUNTER — Inpatient Hospital Stay: Payer: Medicare Other | Attending: Oncology

## 2022-03-08 DIAGNOSIS — Z87891 Personal history of nicotine dependence: Secondary | ICD-10-CM | POA: Diagnosis not present

## 2022-03-08 DIAGNOSIS — N184 Chronic kidney disease, stage 4 (severe): Secondary | ICD-10-CM | POA: Diagnosis not present

## 2022-03-08 DIAGNOSIS — Z880 Allergy status to penicillin: Secondary | ICD-10-CM | POA: Diagnosis not present

## 2022-03-08 DIAGNOSIS — Z888 Allergy status to other drugs, medicaments and biological substances status: Secondary | ICD-10-CM | POA: Diagnosis not present

## 2022-03-08 DIAGNOSIS — Z9049 Acquired absence of other specified parts of digestive tract: Secondary | ICD-10-CM | POA: Insufficient documentation

## 2022-03-08 DIAGNOSIS — I7090 Unspecified atherosclerosis: Secondary | ICD-10-CM | POA: Insufficient documentation

## 2022-03-08 DIAGNOSIS — R5383 Other fatigue: Secondary | ICD-10-CM | POA: Diagnosis not present

## 2022-03-08 DIAGNOSIS — I129 Hypertensive chronic kidney disease with stage 1 through stage 4 chronic kidney disease, or unspecified chronic kidney disease: Secondary | ICD-10-CM | POA: Insufficient documentation

## 2022-03-08 DIAGNOSIS — Z833 Family history of diabetes mellitus: Secondary | ICD-10-CM | POA: Diagnosis not present

## 2022-03-08 DIAGNOSIS — Z803 Family history of malignant neoplasm of breast: Secondary | ICD-10-CM | POA: Insufficient documentation

## 2022-03-08 DIAGNOSIS — D631 Anemia in chronic kidney disease: Secondary | ICD-10-CM | POA: Diagnosis not present

## 2022-03-08 DIAGNOSIS — Z79899 Other long term (current) drug therapy: Secondary | ICD-10-CM | POA: Diagnosis not present

## 2022-03-08 DIAGNOSIS — G894 Chronic pain syndrome: Secondary | ICD-10-CM | POA: Insufficient documentation

## 2022-03-08 DIAGNOSIS — M549 Dorsalgia, unspecified: Secondary | ICD-10-CM | POA: Diagnosis not present

## 2022-03-08 DIAGNOSIS — Z885 Allergy status to narcotic agent status: Secondary | ICD-10-CM | POA: Insufficient documentation

## 2022-03-08 DIAGNOSIS — E785 Hyperlipidemia, unspecified: Secondary | ICD-10-CM | POA: Diagnosis not present

## 2022-03-08 DIAGNOSIS — D509 Iron deficiency anemia, unspecified: Secondary | ICD-10-CM

## 2022-03-08 DIAGNOSIS — Z8269 Family history of other diseases of the musculoskeletal system and connective tissue: Secondary | ICD-10-CM | POA: Insufficient documentation

## 2022-03-08 DIAGNOSIS — Z820 Family history of epilepsy and other diseases of the nervous system: Secondary | ICD-10-CM | POA: Diagnosis not present

## 2022-03-08 DIAGNOSIS — Z823 Family history of stroke: Secondary | ICD-10-CM | POA: Diagnosis not present

## 2022-03-08 DIAGNOSIS — Z8249 Family history of ischemic heart disease and other diseases of the circulatory system: Secondary | ICD-10-CM | POA: Insufficient documentation

## 2022-03-08 DIAGNOSIS — Z886 Allergy status to analgesic agent status: Secondary | ICD-10-CM | POA: Diagnosis not present

## 2022-03-08 LAB — CBC WITH DIFFERENTIAL/PLATELET
Abs Immature Granulocytes: 0.01 10*3/uL (ref 0.00–0.07)
Basophils Absolute: 0 10*3/uL (ref 0.0–0.1)
Basophils Relative: 1 %
Eosinophils Absolute: 0.4 10*3/uL (ref 0.0–0.5)
Eosinophils Relative: 9 %
HCT: 32.5 % — ABNORMAL LOW (ref 36.0–46.0)
Hemoglobin: 10.7 g/dL — ABNORMAL LOW (ref 12.0–15.0)
Immature Granulocytes: 0 %
Lymphocytes Relative: 35 %
Lymphs Abs: 1.6 10*3/uL (ref 0.7–4.0)
MCH: 29.2 pg (ref 26.0–34.0)
MCHC: 32.9 g/dL (ref 30.0–36.0)
MCV: 88.8 fL (ref 80.0–100.0)
Monocytes Absolute: 0.5 10*3/uL (ref 0.1–1.0)
Monocytes Relative: 10 %
Neutro Abs: 2.1 10*3/uL (ref 1.7–7.7)
Neutrophils Relative %: 45 %
Platelets: 245 10*3/uL (ref 150–400)
RBC: 3.66 MIL/uL — ABNORMAL LOW (ref 3.87–5.11)
RDW: 13.3 % (ref 11.5–15.5)
WBC: 4.6 10*3/uL (ref 4.0–10.5)
nRBC: 0 % (ref 0.0–0.2)

## 2022-03-08 LAB — IRON AND TIBC
Iron: 81 ug/dL (ref 28–170)
Saturation Ratios: 28 % (ref 10.4–31.8)
TIBC: 293 ug/dL (ref 250–450)
UIBC: 212 ug/dL

## 2022-03-08 LAB — FERRITIN: Ferritin: 89 ng/mL (ref 11–307)

## 2022-03-09 ENCOUNTER — Encounter: Payer: Self-pay | Admitting: Oncology

## 2022-03-09 ENCOUNTER — Inpatient Hospital Stay (HOSPITAL_BASED_OUTPATIENT_CLINIC_OR_DEPARTMENT_OTHER): Payer: Medicare Other | Admitting: Oncology

## 2022-03-09 VITALS — BP 197/67 | HR 74 | Temp 98.7°F | Resp 20 | Wt 158.8 lb

## 2022-03-09 DIAGNOSIS — D631 Anemia in chronic kidney disease: Secondary | ICD-10-CM

## 2022-03-09 DIAGNOSIS — N184 Chronic kidney disease, stage 4 (severe): Secondary | ICD-10-CM

## 2022-03-09 DIAGNOSIS — I10 Essential (primary) hypertension: Secondary | ICD-10-CM

## 2022-03-09 DIAGNOSIS — G894 Chronic pain syndrome: Secondary | ICD-10-CM | POA: Diagnosis not present

## 2022-03-09 DIAGNOSIS — I129 Hypertensive chronic kidney disease with stage 1 through stage 4 chronic kidney disease, or unspecified chronic kidney disease: Secondary | ICD-10-CM | POA: Diagnosis not present

## 2022-03-09 DIAGNOSIS — R5383 Other fatigue: Secondary | ICD-10-CM | POA: Diagnosis not present

## 2022-03-09 DIAGNOSIS — M549 Dorsalgia, unspecified: Secondary | ICD-10-CM | POA: Diagnosis not present

## 2022-03-09 MED ORDER — VITAMIN C 500 MG PO CAPS: 500.0000 mg | ORAL_CAPSULE | Freq: Every day | ORAL | 3 refills | Status: DC

## 2022-03-09 MED ORDER — FERROUS SULFATE 325 (65 FE) MG PO TBEC: 325.0000 mg | DELAYED_RELEASE_TABLET | Freq: Two times a day (BID) | ORAL | 3 refills | Status: DC

## 2022-03-09 MED ORDER — DOCUSATE SODIUM 100 MG PO CAPS: 100.0000 mg | ORAL_CAPSULE | Freq: Every day | ORAL | 3 refills | Status: DC | PRN

## 2022-03-09 NOTE — Progress Notes (Signed)
?Hematology/Oncology Progress note ?[phone] [phone] ?  ? ? ? ?Patient Care Team: ?Crecencio Mc, MD as PCP - General (Internal Medicine) ? ?REFERRING PROVIDER: ?Crecencio Mc, MD ?REASON FOR VISIT ?Follow up for management of anemia  ? ?HISTORY OF PRESENTING ILLNESS:  ?Sabrina [name] is a  81 y.o.  female with PMH listed below who was referred to me for evaluation of anemia.  ?Patient recently had lab work done which revealed anemia with hemoglobin 11.5.   ?Reviewed patient's previous labs, her hemoglobin ranges from 10.8 to 11.8 in the past.  ?Reports feeling fatigue.  ?Patient denies weight loss, easy bruising, hematochezia, hemoptysis.  ?Has history of CKD and follows up with nephrologist.  ?Chronic pain syndrome. On Tramadol and fentanyl patch.  ?INTERVAL HISTORY ?Sabrina [name] is a 81 y.o. female who has above history reviewed by me today presents for follow up visit for management of anemia.  ?01/28/2022, patient was seen by primary care provider.  Notes were reviewed.  Patient has uncontrolled high blood pressure.  Today in clinic blood pressure remains high.  Systolic blood pressure 803O. ?+ Fatigue. ?Denies any black or bloody stool. ?Patient expresses frustrations of need of taking multiple medications.   ?Patient has profound atherosclerotic occlusive disease.  She was recommended for bilateral lower endarterectomies with consideration for revascularization of the SFA. ? ?Review of Systems  ?Constitutional:  Positive for malaise/fatigue. Negative for chills, fever and weight loss.  ?HENT:  Negative for sore throat.   ?Eyes:  Negative for redness.  ?Respiratory:  Negative for cough, shortness of breath and wheezing.   ?Cardiovascular:  Negative for chest pain, palpitations and leg swelling.  ?Gastrointestinal:  Negative for abdominal pain, blood in stool, nausea and vomiting.  ?Genitourinary:  Negative for dysuria.  ?Musculoskeletal:  Positive for back pain. Negative  for myalgias.  ?Skin:  Negative for rash.  ?Neurological:  Negative for dizziness, tingling and tremors.  ?Endo/Heme/Allergies:  Does not bruise/bleed easily.  ?Psychiatric/Behavioral:  Negative for hallucinations.   ? ?MEDICAL HISTORY:  ?Past Medical History:  ?Diagnosis Date  ? Amputation of fifth toe, right, traumatic, subsequent encounter ([place]) 06/18/2019  ? Anemia of chronic kidney failure   ? Anxiety   ? CAD (coronary artery disease)   ? Carotid artery stenosis   ? Chronic kidney disease, stage III (moderate) (HCC)   ? Followed by Dr. Juleen China  ? Chronic narcotic use 06/24/2014  ? Hyperlipidemia   ? Hypertension   ? Renal artery stenosis ([place])   ? Secondary hyperparathyroidism ([place])   ? Spinal stenosis of lumbar region at multiple levels   ? 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 ? ?SURGICAL HISTORY: ?Past Surgical History:  ?Procedure Laterality Date  ? ABDOMINAL HYSTERECTOMY  1976  ? CAROTID ARTERY ANGIOPLASTY Left   ? CAROTID ENDARTERECTOMY Left   ? CHOLECYSTECTOMY    ? COLONOSCOPY WITH PROPOFOL N/A 08/16/2017  ? Procedure: COLONOSCOPY WITH PROPOFOL;  Surgeon: [name], MD;  Location: [facility] ENDOSCOPY;  Service: Endoscopy;  Laterality: N/A;  ? CORONARY ANGIOPLASTY WITH STENT PLACEMENT  2000  ? CORONARY ARTERY BYPASS GRAFT  2000  ? CYSTOSCOPY WITH STENT PLACEMENT Bilateral   ? ESOPHAGOGASTRODUODENOSCOPY (EGD) WITH PROPOFOL N/A 08/16/2017  ? Procedure: ESOPHAGOGASTRODUODENOSCOPY (EGD) WITH PROPOFOL;  Surgeon: [name], MD;  Location: [facility] ENDOSCOPY;  Service: Endoscopy;  Laterality: N/A;  ? ESOPHAGOGASTRODUODENOSCOPY (EGD) WITH PROPOFOL N/A 06/29/2018  ? Procedure: ESOPHAGOGASTRODUODENOSCOPY (EGD) WITH PROPOFOL;  Surgeon: Virgel Manifold, MD;  Location: [facility] ENDOSCOPY;  Service: Endoscopy;  Laterality: N/A;  ? JOINT REPLACEMENT Bilateral 2000  ? LEFT HEART CATH AND CORONARY ANGIOGRAPHY N/A 11/18/2021  ? Procedure: LEFT HEART CATH AND CORONARY ANGIOGRAPHY;  Surgeon: Yolonda Kida, MD;  Location: [place] CV LAB;  Service: Cardiovascular;  Laterality: N/A;  ? LOWER EXTREMITY ANGIOGRAPHY Right 01/06/2022  ? Procedure: Lower Extremity Angiography;  Surgeon: Katha Cabal, MD;  Location: [place] [facility];  Service: Cardiovascular;  Laterality: Right;  ? RENAL ARTERY ANGIOPLASTY Bilateral mARCH 2015  ? TOE AMPUTATION Right   ? small toe  ? TONSILLECTOMY AND ADENOIDECTOMY    ? TOTAL HIP ARTHROPLASTY Left   ? TOTAL HIP ARTHROPLASTY Right 2015  ? UPPER EXTREMITY ANGIOGRAPHY Left 11/23/2021  ? Procedure: UPPER EXTREMITY ANGIOGRAPHY;  Surgeon: Algernon Huxley, MD;  Location: [place] [facility];  Service: Cardiovascular;  Laterality: Left;  ? ? ?SOCIAL HISTORY: ?Social History  ? ?Socioeconomic History  ? Marital status: Divorced  ?  Spouse name: Not on file  ? Number of children: 3  ? Years of education: Not on file  ? Highest education level: Not on file  ?Occupational History  ? Occupation: retired  ?Tobacco Use  ? Smoking status: Former  ?  Types: Cigarettes  ?  Quit date: 10/25/1998  ?  Years since quitting: 23.3  ? Smokeless tobacco: Never  ?Vaping Use  ? Vaping Use: Never used  ?Substance and Sexual Activity  ? Alcohol use: No  ?  Alcohol/week: 0.0 standard drinks  ? Drug use: Never  ? Sexual activity: Not Currently  ?Other Topics Concern  ? Not on file  ?Social History Narrative  ? Daughter [name] ([place]); 1 in [employer]; 1 in [place]  ? ?Social Determinants of Health  ? ?Financial Resource Strain: Not on file  ?Food Insecurity: Not on file  ?Transportation Needs: Not on file  ?Physical Activity: Not on file  ?Stress: Not on file  ?Social Connections: Not on file  ?Intimate Partner Violence: Not on file  ? ? ?FAMILY HISTORY: ?Family History  ?Problem Relation Age of Onset  ? Stroke Mother   ? Hypertension Mother   ? Diabetes Mother   ? Hypertension Father   ? Heart disease Sister   ?     MI  ? Multiple sclerosis Daughter   ? Multiple sclerosis Son   ? Cerebral aneurysm Son   ? Seizures Son   ? Cerebral  aneurysm Son   ? Breast cancer Paternal Aunt 40  ? ? ?ALLERGIES:  is allergic to citalopram, dilaudid [hydromorphone hcl], hydrochlorothiazide, liothyronine, nsaids, nubain [nalbuphine hcl], penicillins, prasugrel, and statins. ? ?MEDICATIONS:  ?Current Outpatient Medications  ?Medication Sig Dispense Refill  ? Ascorbic Acid (VITAMIN C) 500 MG CAPS Take 500 mg by mouth daily. 30 capsule 3  ? aspirin 81 MG tablet Take 81 mg by mouth daily.    ? butalbital-acetaminophen-caffeine (FIORICET) 50-325-40 MG tablet Take 1 tablet by mouth every 4 (four) hours as needed for headache. 120 tablet 1  ? clopidogrel (PLAVIX) 75 MG tablet Take 1 tablet (75 mg total) by mouth daily. To prevent strokes 90 tablet 1  ? docusate sodium (COLACE) 100 MG capsule Take 1 capsule (100 mg total) by mouth daily as needed for mild constipation or moderate constipation. 30 capsule 3  ? ezetimibe (ZETIA) 10 MG tablet Take 1 tablet (10 mg total) by mouth daily. 90 tablet 1  ? ferrous sulfate 325 (65 FE) MG EC tablet Take 1 tablet (325  mg total) by mouth 2 (two) times daily with a meal. 60 tablet 3  ? hydrALAZINE (APRESOLINE) 100 MG tablet Take 1 tablet (100 mg total) by mouth 3 (three) times daily. Marland KitchenNOTE DOSE INCREASE TO 100 MG. KEEP ON FILE FOR FUTURE REFILLS 270 tablet 1  ? labetalol (NORMODYNE) 300 MG tablet Take 1 tablet (300 mg total) by mouth 2 (two) times daily. For hypertension 180 tablet 1  ? nortriptyline (PAMELOR) 10 MG capsule Take 1 capsule (10 mg total) by mouth 2 (two) times daily. For chronic headaches 180 capsule 1  ? Omega 3 1000 MG CAPS Take 1,000 mg by mouth 2 (two) times daily.    ? pantoprazole (PROTONIX) 40 MG tablet Take 1 tablet (40 mg total) by mouth daily. 30 tablet 5  ? ranolazine (RANEXA) 500 MG 12 hr tablet Take 1 tablet (500 mg total) by mouth 2 (two) times daily. 60 tablet 3  ? traMADol (ULTRAM) 50 MG tablet Take 1 tablet (50 mg total) by mouth every 6 (six) hours as needed. 120 tablet 5  ? valsartan (DIOVAN) 320 MG  tablet Take 1 tablet (320 mg total) by mouth daily. 90 tablet 0  ? ?No current facility-administered medications for this visit.  ? ? ? ?PHYSICAL EXAMINATION: ?ECOG PERFORMANCE STATUS: 1 - Symptomati

## 2022-03-10 ENCOUNTER — Telehealth: Payer: Self-pay | Admitting: *Deleted

## 2022-03-10 ENCOUNTER — Encounter
Admission: RE | Admit: 2022-03-10 | Discharge: 2022-03-10 | Disposition: A | Discharge status: Home / Self Care | Payer: Medicare Other | Source: Ambulatory Visit | Attending: Vascular Surgery | Admitting: Vascular Surgery

## 2022-03-10 ENCOUNTER — Other Ambulatory Visit (INDEPENDENT_AMBULATORY_CARE_PROVIDER_SITE_OTHER): Payer: Self-pay | Admitting: Nurse Practitioner

## 2022-03-10 VITALS — Ht 61.0 in | Wt 158.0 lb

## 2022-03-10 DIAGNOSIS — I739 Peripheral vascular disease, unspecified: Secondary | ICD-10-CM

## 2022-03-10 DIAGNOSIS — Z01818 Encounter for other preprocedural examination: Secondary | ICD-10-CM

## 2022-03-10 DIAGNOSIS — Z01812 Encounter for preprocedural laboratory examination: Secondary | ICD-10-CM

## 2022-03-10 HISTORY — DX: Osteoarthritis of hip, unspecified: M16.9

## 2022-03-10 HISTORY — DX: Gastro-esophageal reflux disease without esophagitis: K21.9

## 2022-03-10 HISTORY — DX: Unspecified cataract: H26.9

## 2022-03-10 HISTORY — DX: Abdominal aortic aneurysm, without rupture, unspecified: I71.40

## 2022-03-10 HISTORY — DX: Chronic diastolic (congestive) heart failure: I50.32

## 2022-03-10 HISTORY — DX: Atherosclerosis of renal artery: I70.1

## 2022-03-10 NOTE — Telephone Encounter (Signed)
Patient informed that 3 prescription were sent to [facility] yesterday ?

## 2022-03-10 NOTE — Telephone Encounter (Signed)
Patient called stating she was to have a prescription sent to pharmacy yesterday. Please adivse ?

## 2022-03-10 NOTE — Patient Instructions (Addendum)
Your procedure is scheduled on: Wednesday, May 24 ?Report to the Registration Desk on the 1st floor of the [facility]. ?To find out your arrival time, please call [phone] between 1PM - 3PM on: Tuesday, May 23 ?If your arrival time is 6:00 am, do not arrive prior to that time as the [facility] entrance doors do not open until 6:00 am. ? ?REMEMBER: ?Instructions that are not followed completely may result in serious medical risk, up to and including death; or upon the discretion of your surgeon and anesthesiologist your surgery may need to be rescheduled. ? ?Do not eat or drink after midnight the night before surgery.  ?No gum chewing, lozengers or hard candies. ? ?TAKE THESE MEDICATIONS THE MORNING OF SURGERY WITH A SIP OF WATER: ? ?Ezetimibe (Zetia) ?Hydralazine ?Labetalol ?Nortriptyline  ?Pantoprazole (Protonix) - (take one the night before and one on the morning of surgery - helps to prevent nausea after surgery.) ?Ranolazine (Ranexa) ? ?Follow recommendations from Cardiologist, Pulmonologist or PCP regarding stopping Aspirin and Plavix. ? ?According to Dr. Nino Parsley standing orders:  ? ?Continue taking the aspirin 81 mg ? ?Stop clopidogrel (plavix) 5 days prior to surgery. Last day to take is Thursday, May 18. Resume AFTER surgery per surgeon instructions. ? ?One week prior to surgery: starting May 17 ?Stop Anti-inflammatories (NSAIDS) such as Advil, Aleve, Ibuprofen, Motrin, Naproxen, Naprosyn and Aspirin based products such as Excedrin, Goodys Powder, BC Powder. ?Stop ANY OVER THE COUNTER supplements until after surgery. ?You may however, continue to take Tylenol if needed for pain up until the day of surgery. ? ?No Alcohol for 24 hours before or after surgery. ? ?No Smoking including e-cigarettes for 24 hours prior to surgery.  ?No chewable tobacco products for at least 6 hours prior to surgery.  ?No nicotine patches on the day of surgery. ? ?On the morning of surgery brush your teeth with  toothpaste and water, you may rinse your mouth with mouthwash if you wish. ?Do not swallow any toothpaste or mouthwash. ? ?Use CHG Soap as directed on instruction sheet. ? ?Do not wear jewelry, make-up, hairpins, clips or nail polish. ? ?Do not wear lotions, powders, or perfumes.  ? ?Do not shave body from the neck down 48 hours prior to surgery just in case you cut yourself which could leave a site for infection.  ?Also, freshly shaved skin may become irritated if using the CHG soap. ? ?Contact lenses, hearing aids and dentures may not be worn into surgery. ? ?Do not bring valuables to the hospital. [facility] is not responsible for any missing/lost belongings or valuables.  ? ?Notify your doctor if there is any change in your medical condition (cold, fever, infection). ? ?Wear comfortable clothing (specific to your surgery type) to the hospital. ? ?After surgery, you can help prevent lung complications by doing breathing exercises.  ?Take deep breaths and cough every 1-2 hours. Your doctor may order a device called an Incentive Spirometer to help you take deep breaths. ? ?If you are being admitted to the hospital overnight, leave your suitcase in the car. ?After surgery it may be brought to your room. ? ?If you are being discharged the day of surgery, you will not be allowed to drive home. ?You will need a responsible adult (18 years or older) to drive you home and stay with you that night.  ? ?If you are taking public transportation, you will need to have a responsible adult (18 years or older) with you. ?Please  confirm with your physician that it is acceptable to use public transportation.  ? ?Please call the [facility] at [phone] if you have any questions about these instructions. ? ?Surgery Visitation Policy: ? ?Patients undergoing a surgery or procedure may have two family members or support persons with them as long as the person is not COVID-19 positive or experiencing its  symptoms.  ? ?Inpatient Visitation:   ? ?Visiting hours are 7 a.m. to 8 p.m. ?Up to four visitors are allowed at one time in a patient room, including children. The visitors may rotate out with other people during the day. One designated support person (adult) may remain overnight.  ? ? ?Preparing for Surgery with CHLORHEXIDINE GLUCONATE (CHG) Soap ? ?Before surgery, you can play an important role by reducing the number of germs on your skin.  ?CHG (Chlorhexidine gluconate) soap is an antiseptic cleanser which kills germs and bonds with the skin to continue killing germs even after washing. ? ?Please do not use if you have an allergy to CHG or antibacterial soaps. If your skin becomes reddened/irritated stop using the CHG. ? ?1. Shower the NIGHT BEFORE SURGERY and the MORNING OF SURGERY with CHG soap. ? ?2. If you choose to wash your hair, wash your hair first as usual with your normal shampoo. ? ?3. After shampooing, rinse your hair and body thoroughly to remove the shampoo. ? ?4. Use CHG as you would any other liquid soap. You can apply CHG directly to the skin and wash gently with a scrungie or a clean washcloth. ? ?5. Apply the CHG soap to your body only from the neck down. Do not use on open wounds or open sores. Avoid contact with your eyes, ears, mouth, and genitals (private parts). Wash face and genitals (private parts) with your normal soap. ? ?6. Wash thoroughly, paying special attention to the area where your surgery will be performed. ? ?7. Thoroughly rinse your body with warm water. ? ?8. Do not shower/wash with your normal soap after using and rinsing off the CHG soap. ? ?9. Pat yourself dry with a clean towel. ? ?10. Wear clean pajamas to bed the night before surgery. ? ?12. Place clean sheets on your bed the night of your first shower and do not sleep with pets. ? ?13. Shower again with the CHG soap on the day of surgery prior to arriving at the hospital. ? ?14. Do not apply any  deodorants/lotions/powders. ? ?15. Please wear clean clothes to the hospital.  ?

## 2022-03-10 NOTE — Telephone Encounter (Signed)
Ferrous sulfate, Vitamin C & colace were sent to [facility] on [address] yesterday. Pt may also pick up these medications OTC.  ?

## 2022-03-12 ENCOUNTER — Telehealth (INDEPENDENT_AMBULATORY_CARE_PROVIDER_SITE_OTHER): Payer: Self-pay

## 2022-03-12 ENCOUNTER — Encounter
Admission: RE | Admit: 2022-03-12 | Discharge: 2022-03-12 | Disposition: A | Discharge status: Home / Self Care | Payer: Medicare Other | Source: Ambulatory Visit | Attending: Vascular Surgery | Admitting: Vascular Surgery

## 2022-03-12 DIAGNOSIS — Z01818 Encounter for other preprocedural examination: Secondary | ICD-10-CM | POA: Insufficient documentation

## 2022-03-12 DIAGNOSIS — Z01810 Encounter for preprocedural cardiovascular examination: Secondary | ICD-10-CM | POA: Diagnosis not present

## 2022-03-12 DIAGNOSIS — I739 Peripheral vascular disease, unspecified: Secondary | ICD-10-CM | POA: Diagnosis not present

## 2022-03-12 DIAGNOSIS — Z9889 Other specified postprocedural states: Secondary | ICD-10-CM | POA: Insufficient documentation

## 2022-03-12 DIAGNOSIS — Z01812 Encounter for preprocedural laboratory examination: Secondary | ICD-10-CM

## 2022-03-12 LAB — BASIC METABOLIC PANEL
Anion gap: 7 (ref 5–15)
BUN: 17 mg/dL (ref 8–23)
CO2: 27 mmol/L (ref 22–32)
Calcium: 9.4 mg/dL (ref 8.9–10.3)
Chloride: 104 mmol/L (ref 98–111)
Creatinine, Ser: 1.47 mg/dL — ABNORMAL HIGH (ref 0.44–1.00)
GFR, Estimated: 36 mL/min — ABNORMAL LOW (ref >60)
Glucose, Bld: 94 mg/dL (ref 70–99)
Potassium: 4 mmol/L (ref 3.5–5.1)
Sodium: 138 mmol/L (ref 135–145)

## 2022-03-12 LAB — SURGICAL PCR SCREEN
MRSA, PCR: NEGATIVE
Staphylococcus aureus: NEGATIVE

## 2022-03-12 NOTE — Telephone Encounter (Signed)
Patient called stating she was told to stop her Plavix 5 days before her surgery. Per Arna Medici she is to discontinue for 5 days prior to surgery due to the risk of bleeding. Patient was given this information and understood.

## 2022-03-15 ENCOUNTER — Encounter: Payer: Self-pay | Admitting: Vascular Surgery

## 2022-03-16 ENCOUNTER — Encounter: Payer: Self-pay | Admitting: Vascular Surgery

## 2022-03-16 DIAGNOSIS — R221 Localized swelling, mass and lump, neck: Secondary | ICD-10-CM | POA: Diagnosis not present

## 2022-03-16 MED ORDER — CHLORHEXIDINE GLUCONATE CLOTH 2 % EX PADS: 6.0000 | MEDICATED_PAD | Freq: Once | CUTANEOUS | Status: DC

## 2022-03-16 MED ORDER — CHLORHEXIDINE GLUCONATE 0.12 % MT SOLN: 15.0000 mL | Freq: Once | OROMUCOSAL | Status: AC

## 2022-03-16 MED ORDER — LACTATED RINGERS IV SOLN: INTRAVENOUS | Status: DC

## 2022-03-16 MED ORDER — ORAL CARE MOUTH RINSE: 15.0000 mL | Freq: Once | OROMUCOSAL | Status: AC

## 2022-03-16 MED ORDER — VANCOMYCIN HCL IN DEXTROSE 1-5 GM/200ML-% IV SOLN
1000.0000 mg | INTRAVENOUS | Status: AC
  Administered 2022-03-17: 1000 mg via INTRAVENOUS

## 2022-03-16 NOTE — Progress Notes (Signed)
Perioperative Services  Pre-Admission/Anesthesia Testing Clinical Review  Date: 03/16/22  Patient Demographics:  Name: Sabrina [name] DOB:   1940/10/28 MRN:   [id number]  Planned Surgical Procedure(s):    Case: [id number] Date/Time: 03/17/22 0715   Procedures:      ENDARTERECTOMY FEMORAL ( BILATERAL SFA STENT) (Bilateral)     INSERTION OF ILIAC STENT (Left)     APPLICATION OF CELL SAVER   Anesthesia type: General   Pre-op diagnosis: ASO WITH CLAUDICATION   Location: [facility] OR ROOM 08 / [place] [facility] FOR ANESTHESIA GROUP   Surgeons: Katha Cabal, MD   NOTE: Available PAT nursing documentation and vital signs have been reviewed. Clinical nursing staff has updated patient's PMH/PSHx, current medication list, and drug allergies/intolerances to ensure comprehensive history available to assist in medical decision making as it pertains to the aforementioned surgical procedure and anticipated anesthetic course. Extensive review of available clinical information performed. [place] PMH and PSHx updated with any diagnoses/procedures that  may have been inadvertently omitted during her intake with the pre-admission testing department's nursing staff.  Clinical Discussion:  Sabrina [name] is a 81 y.o. female who is submitted for pre-surgical anesthesia review and clearance prior to her undergoing the above procedure.Patient is a Former Smoker (quit 10/1998). Pertinent PMH includes: CAD (s/p 3v CABG), CHF, pulmonary hypertension, aortic stenosis, mitral stenosis, PVD, carotid artery stenosis, abdominal aortic ectasia, HTN, HLD, secondary hyperparathyroidism, CKD-III, SOB, GERD (on daily PPI), OA, cervical spondylosis, lumbar stenosis with neurogenic claudication, chronic pain syndrome (on COT), anxiety.  Patient is followed by cardiology Clayborn Bigness, MD). She was last seen in the cardiology clinic on 12/31/2021; notes reviewed.  At the time of her clinic visit, patient doing "reasonably well" from  a cardiovascular perspective.  She denied any episodes of chest pain, increase shortness of breath, PND, orthopnea, palpitations, significant peripheral edema, vertiginous symptoms, or presyncope/syncope.  Patient with an extensive cardiovascular history.  Patient underwent a three-vessel CABG procedure on 03/29/2000 at [facility].  LIMA-LAD, SVG-distal RCA, and SVG-ramus intermedius bypass grafts were placed.  Patient with extensive carotid artery disease.  She underwent a LEFT carotid endarterectomy on 09/09/2003.  Since that time, carotid Dopplers have been monitored.  Last carotid Doppler performed on 12/23/2021 revealed a 1-39% stenosis of the LICA with total occlusion contralaterally in the RICA.   Patient with abdominal aortic ectasia noted on ultrasound performed on 07/13/2017.  Ectasia measurements have been monitored via routine imaging.  Last ultrasound imaging of the aorta was performed on 04/07/2020, at which time ectatic area measured 2.6 cm.  Myocardial perfusion imaging study performed on 03/18/2020 revealed a normal left ventricular systolic function with a hyperdynamic LVEF of 76%.  There was no evidence of stress-induced myocardial ischemia or arrhythmia.  Study determined to be low risk.  TTE performed on 11/16/2021 revealed a normal left ventricular systolic function with mild LVH; LVEF >55%. Diastolic Doppler parameters consistent with abnormal relaxation (G1DD).  There was mild pulmonary and moderate tricuspid valve regurgitation.  There were significant transvalvular gradients noted to suggest both aortic (mean pressure gradient 9 mmHg) and mitral valve (mean pressure gradient 5 mmHg) stenosis.  Most recent cardiac catheterization was performed on 11/18/2021 revealed a normal left ventricular systolic function with an EF of 60%.  There was multivessel CAD; 95% proximal RCA-1, 95% proximal RCA-2, 100% proximal to mid RCA, 75% ostial LM to ostial LAD, 50% ostial  LAD, 50% ostial to proximal LAD, 95% mid LAD, 80% proximal LCx, 90% mid  LCx, 80% distal LCx with 80% stenosis of the side branch of OM 3.  Bypass grafts occluded; 100% occlusion of the SVG-RCA graft, 50% proximal SVG-OM, and 90% stenosis of the distal SVG-OM. There were tandem severe 90% and 75% stenoses of the subclavian.  Unable to evaluate LIMA-LAD graft secondary to severe subclavian disease.  Patient being referred to vascular for treatment of subclavian.  Will consider reevaluating LIMA-LAD graft once subclavian has been treated.  Concerns for LEFT subclavian steal syndrome.  Patient underwent 8.0 x 38 mm Lifestream stent placement to the LEFT subclavian on 11/23/2021.  Given patient's significant history of PVD and prior CABG, she remains on daily DAPT therapy (ASA + clopidogrel).  Patient reported to be compliant with therapy with no evidence or reports of GI bleeding.  Blood pressure elevated at 172/84 on currently prescribed vasodilator, beta-blocker, and ARB therapies.  Patient is on ezetimibe for her HLD and further ASCVD prevention.  Additionally, patient is on daily ranolazine dose to prevent recurrent episodes of angina.  T2DM well-controlled on currently prescribed regimen; last HgbA1c was 5.7% when checked on 11/10/2021.  No changes were made to her medication regimen.  Patient follow-up with outpatient cardiology in 6 months or sooner if needed.  Sabrina [name] is scheduled for a BILATERAL FEMORAL ENDARTERECTOMY FEMORAL AND SFA STENTING on 03/17/2022 with Dr. Hortencia Pilar, MD. Given patient's past medical history significant for cardiovascular diagnoses, presurgical cardiac clearance was sought by the PAT team. Per cardiology, "this patient is optimized for surgery and may proceed with the planned procedural course with a ACCEPTABLE risk of significant perioperative cardiovascular complications".  Again, patient is on daily DAPT therapy.  Per protocol orders from Dr. Delana Meyer for this  procedure, patient will hold her clopidogrel for 5 days prior to her procedure with plans to restart as soon as postoperatively respectively minimized by her primary attending surgeon.  The patient is aware that her last dose of clopidogrel will be on 03/11/2022.  The patient will continue her daily low-dose ASA throughout her perioperative course.  Patient denies previous perioperative complications with anesthesia in the past. In review of the available records, it is noted that patient underwent a general anesthetic course here at [facility] (ASA III) in 06/2018 without documented complications.      03/10/2022    1:25 PM 03/09/2022    2:15 PM 01/28/2022   11:00 AM  Vitals with BMI  Height 5\' 1"   5\' 1"   Weight 158 lbs 158 lbs 13 oz 161 lbs 6 oz  BMI 09.60  45.40  Systolic  981 191  Diastolic  67 78  Pulse  74 68    Providers/Specialists:   NOTE: Primary physician provider listed below. Patient may have been seen by APP or partner within same practice.   PROVIDER ROLE / SPECIALTY LAST OV  Schnier, [name], MD Vascular Surgery (Surgeon) 01/25/2022  Crecencio Mc, MD Primary Care Provider 01/28/2022  Katrine Coho, MD Cardiology 12/31/2021  Earlie Server, MD Hematology 03/09/2022   Allergies:  Citalopram, Dilaudid [hydromorphone hcl], Hydrochlorothiazide, Liothyronine, Nsaids, Nubain [nalbuphine hcl], Penicillins, Prasugrel, and Statins  Current Home Medications:   No current facility-administered medications for this encounter.    Ascorbic Acid (VITAMIN C) 500 MG CAPS   aspirin 81 MG tablet   butalbital-acetaminophen-caffeine (FIORICET) 50-325-40 MG tablet   clopidogrel (PLAVIX) 75 MG tablet   docusate sodium (COLACE) 100 MG capsule   ezetimibe (ZETIA) 10 MG tablet   ferrous sulfate  325 (65 FE) MG EC tablet   hydrALAZINE (APRESOLINE) 100 MG tablet   labetalol (NORMODYNE) 300 MG tablet   nortriptyline (PAMELOR) 10 MG capsule   Omega 3 1000  MG CAPS   pantoprazole (PROTONIX) 40 MG tablet   ranolazine (RANEXA) 500 MG 12 hr tablet   traMADol (ULTRAM) 50 MG tablet   valsartan (DIOVAN) 320 MG tablet   History:   Past Medical History:  Diagnosis Date   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   a.) Surveillance measurements: 2.6 cm ([place] 07/13/2017), 2.9 cm ([facility] 09/04/2017), 2.9 cm ([place] 09/14/2018), 2.9 cm ([place] 10/03/2019), 2.6 cm ([place] 04/07/2020)   Amputation of fifth toe, right, traumatic, subsequent encounter ([place]) 06/18/2019   Anemia of chronic kidney failure    Anxiety    Aortic stenosis 03/18/2020   a.) TTE 03/18/2020: EF >55%; mild AS (MPG 8.7 mmHg). b.) TTE 11/16/2021: EF >55%; mild AS (MPG 9 mmHg)   CAD (coronary artery disease)    a.) s/p 3v CABG 03/29/2000   Carotid artery stenosis    a.) s/p LEFT CEA 09/09/2003. b.) Carotid doppler 63/87/5643: 3-29% LICA, CTO RICA; subclavian stenosis   Cataracts, bilateral    Cervical spondylosis without myelopathy    Chronic diastolic CHF (congestive heart failure), NYHA class 3 (HCC)    a.) TTE 05/27/2016: EF >55%, mild LA enlargement, triv PR, mild MR, mod TR; G3DD. b.) TTE 12/12/2017: EF >55%, mild LVH, BAE, mild MR/PR, mod TR; RVSP 52.8 mmHg. c.) TTE 03/18/2020: EF >55%, BAD, AS (MPG 8.7 mmHg); triv MR, mild TR/PR. d.) TTE 11/16/2021: EF >55%, LVH, G1DD, triv MR, mild PR, mod TR; AS (MPG 9 mmHg); MS (MPG 5 mmHg)   Chronic kidney disease, stage III (moderate) (HCC)    Chronic narcotic use 06/24/2014   Chronic pain syndrome    GERD (gastroesophageal reflux disease)    History of 2019 novel coronavirus disease (COVID-19) 09/22/2021   Hyperlipidemia    Hypertension    Long term current use of antithrombotics/antiplatelets    a.) on daily DAPT therapy (ASA + clopidogrel)   Lumbar stenosis with neurogenic claudication    Mitral stenosis 11/16/2021   a.) TTE 11/16/2021: EF >55%; mod MS (MPG 5 mmHg)   Osteoarthritis of hip    Pulmonary hypertension ([place]) 12/12/2017   a.) TTE  12/12/2017: mild; RVSP 52.8 mmHg   PVD (peripheral vascular disease) (HCC)    Renal artery stenosis (HCC)    S/P CABG x 3 03/29/2000   a.) 3v CABG: LIMA-LAD, SVG-dRCA, SVG-RI   Secondary hyperparathyroidism (HCC)    SOB (shortness of breath)    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   Past Surgical History:  Procedure Laterality Date   ABDOMINAL HYSTERECTOMY  1976   CAROTID ARTERY ANGIOPLASTY Left    CAROTID ENDARTERECTOMY Left 09/09/2003   Procedure: CAROTID ENDARTERECTOMY; Location: [facility]; Surgeon: Maura Crandall, MD   COLONOSCOPY WITH PROPOFOL N/A 08/16/2017   Procedure: COLONOSCOPY WITH PROPOFOL;  Surgeon: [name], MD;  Location: [facility] ENDOSCOPY;  Service: Endoscopy;  Laterality: N/A;   CORONARY ANGIOPLASTY WITH STENT PLACEMENT  2000   CORONARY ARTERY BYPASS GRAFT N/A 03/29/2000   Procedure: 3v CORONARY ARTERY BYPASS GRAFT; Location: [facility]   CYSTOSCOPY WITH STENT PLACEMENT Bilateral    ESOPHAGOGASTRODUODENOSCOPY (EGD) WITH PROPOFOL N/A 08/16/2017   Procedure: ESOPHAGOGASTRODUODENOSCOPY (EGD) WITH PROPOFOL;  Surgeon: [name], MD;  Location: [facility] ENDOSCOPY;  Service: Endoscopy;  Laterality: N/A;  ESOPHAGOGASTRODUODENOSCOPY (EGD) WITH PROPOFOL N/A 06/29/2018   Procedure: ESOPHAGOGASTRODUODENOSCOPY (EGD) WITH PROPOFOL;  Surgeon: Virgel Manifold, MD;  Location: [facility] ENDOSCOPY;  Service: Endoscopy;  Laterality: N/A;   LAPAROSCOPIC CHOLECYSTECTOMY Left 10/26/1999   Procedure: LAPAROSCOPIC CHOLECYSTECTOMY; Location: [facility]; Surgeon: Rochel Brome, MD   LEFT HEART CATH AND CORONARY ANGIOGRAPHY N/A 11/18/2021   Procedure: LEFT HEART CATH AND CORONARY ANGIOGRAPHY;  Surgeon: Yolonda Kida, MD;  Location: [place] [facility];  Service: Cardiovascular;  Laterality: N/A;   LEFT HEART CATH AND CORS/GRAFTS ANGIOGRAPHY Left 11/19/2002   Procedure: LEFT HEART CATH AND CORS/GRAFTS ANGIOGRAPHY; Location:  [place]; Surgeon: Katrine Coho, MD   LEFT HEART CATH AND CORS/GRAFTS ANGIOGRAPHY Left 09/17/2003   Procedure: LEFT HEART CATH AND CORS/GRAFTS ANGIOGRAPHY; Location: [place]; Surgeon: Katrine Coho, MD   LOWER EXTREMITY ANGIOGRAPHY Right 01/06/2022   Procedure: Lower Extremity Angiography;  Surgeon: Katha Cabal, MD;  Location: [place] [facility];  Service: Cardiovascular;  Laterality: Right;   RENAL ARTERY ANGIOPLASTY Bilateral 12/2013   TOE AMPUTATION Right    small toe   TONSILLECTOMY AND ADENOIDECTOMY     TOTAL HIP ARTHROPLASTY Left 2005   TOTAL HIP ARTHROPLASTY Right 2015   UPPER EXTREMITY ANGIOGRAPHY Left 11/23/2021   Procedure: UPPER EXTREMITY ANGIOGRAPHY;  Surgeon: Algernon Huxley, MD;  Location: [place] [facility];  Service: Cardiovascular;  Laterality: Left;   Family History  Problem Relation Age of Onset   Stroke Mother    Hypertension Mother    Diabetes Mother    Hypertension Father    Heart disease Sister        MI   Multiple sclerosis Daughter    Multiple sclerosis Son    Cerebral aneurysm Son    Seizures Son    Cerebral aneurysm Son    Breast cancer Paternal Aunt 80   Social History   Tobacco Use   Smoking status: Former    Types: Cigarettes    Quit date: 10/25/1998    Years since quitting: 23.4   Smokeless tobacco: Never  Vaping Use   Vaping Use: Never used  Substance Use Topics   Alcohol use: No    Alcohol/week: 0.0 standard drinks   Drug use: Never    Pertinent Clinical Results:  LABS: Labs reviewed: Acceptable for surgery.  Lab Results  Component Value Date   WBC 4.6 03/08/2022   HGB 10.7 (L) 03/08/2022   HCT 32.5 (L) 03/08/2022   MCV 88.8 03/08/2022   PLT 245 03/08/2022   Component Date Value Ref Range Status   ABO/RH(D) 03/12/2022 B POS   Final   Antibody Screen 03/12/2022 NEG   Final   Sample Expiration 03/12/2022 03/26/2022,2359   Final   Extend sample reason 03/12/2022    Final                   Value:NO TRANSFUSIONS OR PREGNANCY  IN THE PAST 3 MONTHS Performed at [facility], [address] [place], [place] [address]    Sodium 03/12/2022 138  135 - 145 mmol/L Final   Potassium 03/12/2022 4.0  3.5 - 5.1 mmol/L Final   Chloride 03/12/2022 104  98 - 111 mmol/L Final   CO2 03/12/2022 27  22 - 32 mmol/L Final   Glucose, Bld 03/12/2022 94  70 - 99 mg/dL Final   Glucose reference range applies only to samples taken after fasting for at least 8 hours.   BUN 03/12/2022 17  8 - 23 mg/dL Final   Creatinine, Ser  03/12/2022 1.47 (H)  0.44 - 1.00 mg/dL Final   Calcium 03/12/2022 9.4  8.9 - 10.3 mg/dL Final   GFR, Estimated 03/12/2022 36 (L)  >60 mL/min Final   Comment: (NOTE) Calculated using the CKD-EPI Creatinine Equation (2021)    Anion gap 03/12/2022 7  5 - 15 Final   Performed at [facility], [address] [place], [place] [address]   MRSA, PCR 03/12/2022 NEGATIVE  NEGATIVE Final   Staphylococcus aureus 03/12/2022 NEGATIVE  NEGATIVE Final   Comment: (NOTE) The Xpert SA Assay (FDA approved for NASAL specimens in patients 66 years of age and older), is one component of a comprehensive surveillance program. It is not intended to diagnose infection nor to guide or monitor treatment. Performed at [facility], [address] [place], [place] [address]     ECG: Date: 03/12/2022 Time ECG obtained: 1521 PM Rate: 67 bpm Rhythm: normal sinus Axis (leads I and aVF): Normal Intervals: PR 198 ms. QRS 92 ms. QTc 439 ms. ST segment and T wave changes: No evidence of acute ST segment elevation or depression, although respiratory/movement artifact present.  Evidence of an age undetermined septal infarct present. Comparison: Similar to previous tracing obtained on 09/22/2021   IMAGING / PROCEDURES: TRANSTHORACIC ECHOCARDIOGRAM performed on 11/16/2021 Normal left ventricular systolic function with an EF of >42% Mild LVH Diastolic Doppler parameters consistent with abnormal relaxation  (G1DD). Mildly enlarged left atrium Trivial MR Mild PR Moderate TR Trivial aortic valve stenosis; mean pressure gradient 9.0 mmHg Moderate mitral valve stenosis; mean pressure gradient 5.0 mmHg No pericardial effusion  VAS US CAROTID performed on 12/23/2021 Right Carotid: Evidence consistent with a known total occlusion of the right ICA.  Left Carotid: Velocities in the left ICA are consistent with a 1-39% stenosis.  The extracranial vessels were near-normal with only minimal wall thickening or plaque.  No significant change when compared to the previous exam on 10/02/20.   LEFT HEART CATHETERIZATION AND CORONARY ANGIOGRAPHY performed on 11/18/2021 Normal left ventricular systolic function with an EF of 55 to 65% LVEDP normal Tandem severe 90% and 75% stenoses of the subclavian; concern for subclavian steal syndrome. Multivessel CAD 95% proximal RCA-1 95% proximal RCA-2 100% proximal to mid RCA 75 distal LM to ostial LAD 50% ostial LAD 50% ostial to proximal LAD 95% mid LAD 80% ostial to proximal LCx 90% mid LCx 80% distal LCx with 80% stenosed side branch of OM3 Bypass grafts CTO of SVG-RCA 50% proximal SVG-OM 90% distal SVG-OM LIMA-LAD appears to be patent, however unable to assess the ostium because of severe subclavian disease. Recommendations Recommend medical therapy for CAD Consider reevaluating LIMA wants subclavian is treated or via left radial approach   MYOCARDIAL PERFUSION IMAGING STUDY (LEXISCAN) performed on 03/18/2020 Normal left ventricular systolic function with a hyperdynamic LVEF of 76% Normal myocardial thickening and wall motion No artifact Ventricular cavity size normal No evidence of stress-induced myocardial ischemia or arrhythmia Low risk study  Impression and Plan:  Sabrina [name] has been referred for pre-anesthesia review and clearance prior to her undergoing the planned anesthetic and procedural courses. Available labs, pertinent  testing, and imaging results were personally reviewed by me. This patient has been appropriately cleared by cardiology with an overall ACCEPTABLE risk of significant perioperative cardiovascular complications.  Based on clinical review performed today (03/16/22), barring any significant acute changes in the patient's overall condition, it is anticipated that she will be able to proceed with the planned surgical intervention. Any acute changes  in clinical condition may necessitate her procedure being postponed and/or cancelled. Patient will meet with anesthesia team (MD and/or CRNA) on the day of her procedure for preoperative evaluation/assessment. Questions regarding anesthetic course will be fielded at that time.   Pre-surgical instructions were reviewed with the patient during her PAT appointment and questions were fielded by PAT clinical staff. Patient was advised that if any questions or concerns arise prior to her procedure then she should return a call to PAT and/or her surgeon's office to discuss.  Honor Loh, MSN, APRN, FNP-C, CEN [facility]  Peri-operative Services Nurse Practitioner Phone: [phone] Fax: [phone] 03/16/22 12:43 PM  NOTE: This note has been prepared using Dragon dictation software. Despite my best ability to proofread, there is always the potential that unintentional transcriptional errors may still occur from this process.

## 2022-03-17 ENCOUNTER — Encounter: Payer: Self-pay | Admitting: Vascular Surgery

## 2022-03-17 ENCOUNTER — Inpatient Hospital Stay: Payer: Medicare Other

## 2022-03-17 ENCOUNTER — Inpatient Hospital Stay
Admission: RE | Admit: 2022-03-17 | Discharge: 2022-03-17 | Disposition: A | Discharge status: Home / Self Care | Payer: Medicare Other | Source: Home / Self Care | Attending: Internal Medicine | Admitting: Internal Medicine

## 2022-03-17 ENCOUNTER — Inpatient Hospital Stay
Admission: RE | Admit: 2022-03-17 | Discharge: 2022-03-24 | DRG: 252 | Disposition: A | Discharge status: Rehabilitation Facility | Payer: Medicare Other | Attending: Internal Medicine | Admitting: Internal Medicine

## 2022-03-17 ENCOUNTER — Inpatient Hospital Stay: Payer: Medicare Other | Admitting: Urgent Care

## 2022-03-17 ENCOUNTER — Encounter
Admission: RE | Disposition: A | Discharge status: Rehabilitation Facility | Payer: Self-pay | Source: Home / Self Care | Attending: Student in an Organized Health Care Education/Training Program

## 2022-03-17 ENCOUNTER — Other Ambulatory Visit: Payer: Self-pay

## 2022-03-17 DIAGNOSIS — N1832 Chronic kidney disease, stage 3b: Secondary | ICD-10-CM | POA: Diagnosis not present

## 2022-03-17 DIAGNOSIS — E782 Mixed hyperlipidemia: Secondary | ICD-10-CM | POA: Diagnosis not present

## 2022-03-17 DIAGNOSIS — I70213 Atherosclerosis of native arteries of extremities with intermittent claudication, bilateral legs: Secondary | ICD-10-CM | POA: Diagnosis not present

## 2022-03-17 DIAGNOSIS — Z79899 Other long term (current) drug therapy: Secondary | ICD-10-CM

## 2022-03-17 DIAGNOSIS — J9602 Acute respiratory failure with hypercapnia: Secondary | ICD-10-CM | POA: Diagnosis not present

## 2022-03-17 DIAGNOSIS — Z7902 Long term (current) use of antithrombotics/antiplatelets: Secondary | ICD-10-CM

## 2022-03-17 DIAGNOSIS — G894 Chronic pain syndrome: Secondary | ICD-10-CM | POA: Diagnosis present

## 2022-03-17 DIAGNOSIS — D638 Anemia in other chronic diseases classified elsewhere: Secondary | ICD-10-CM

## 2022-03-17 DIAGNOSIS — I70223 Atherosclerosis of native arteries of extremities with rest pain, bilateral legs: Principal | ICD-10-CM

## 2022-03-17 DIAGNOSIS — I130 Hypertensive heart and chronic kidney disease with heart failure and stage 1 through stage 4 chronic kidney disease, or unspecified chronic kidney disease: Secondary | ICD-10-CM | POA: Diagnosis present

## 2022-03-17 DIAGNOSIS — I083 Combined rheumatic disorders of mitral, aortic and tricuspid valves: Secondary | ICD-10-CM | POA: Diagnosis not present

## 2022-03-17 DIAGNOSIS — N1831 Chronic kidney disease, stage 3a: Secondary | ICD-10-CM

## 2022-03-17 DIAGNOSIS — K5901 Slow transit constipation: Secondary | ICD-10-CM | POA: Diagnosis not present

## 2022-03-17 DIAGNOSIS — Z79891 Long term (current) use of opiate analgesic: Secondary | ICD-10-CM

## 2022-03-17 DIAGNOSIS — Z8249 Family history of ischemic heart disease and other diseases of the circulatory system: Secondary | ICD-10-CM

## 2022-03-17 DIAGNOSIS — R5381 Other malaise: Secondary | ICD-10-CM | POA: Diagnosis not present

## 2022-03-17 DIAGNOSIS — E039 Hypothyroidism, unspecified: Secondary | ICD-10-CM | POA: Diagnosis not present

## 2022-03-17 DIAGNOSIS — N179 Acute kidney failure, unspecified: Secondary | ICD-10-CM | POA: Diagnosis not present

## 2022-03-17 DIAGNOSIS — I739 Peripheral vascular disease, unspecified: Secondary | ICD-10-CM | POA: Diagnosis not present

## 2022-03-17 DIAGNOSIS — T8149XA Infection following a procedure, other surgical site, initial encounter: Secondary | ICD-10-CM | POA: Diagnosis not present

## 2022-03-17 DIAGNOSIS — G928 Other toxic encephalopathy: Secondary | ICD-10-CM | POA: Diagnosis not present

## 2022-03-17 DIAGNOSIS — F32A Depression, unspecified: Secondary | ICD-10-CM | POA: Diagnosis not present

## 2022-03-17 DIAGNOSIS — I469 Cardiac arrest, cause unspecified: Secondary | ICD-10-CM | POA: Diagnosis not present

## 2022-03-17 DIAGNOSIS — E6609 Other obesity due to excess calories: Secondary | ICD-10-CM | POA: Diagnosis not present

## 2022-03-17 DIAGNOSIS — I214 Non-ST elevation (NSTEMI) myocardial infarction: Secondary | ICD-10-CM | POA: Diagnosis not present

## 2022-03-17 DIAGNOSIS — D649 Anemia, unspecified: Secondary | ICD-10-CM

## 2022-03-17 DIAGNOSIS — I5032 Chronic diastolic (congestive) heart failure: Secondary | ICD-10-CM | POA: Diagnosis not present

## 2022-03-17 DIAGNOSIS — S31109A Unspecified open wound of abdominal wall, unspecified quadrant without penetration into peritoneal cavity, initial encounter: Secondary | ICD-10-CM | POA: Diagnosis not present

## 2022-03-17 DIAGNOSIS — I6523 Occlusion and stenosis of bilateral carotid arteries: Secondary | ICD-10-CM | POA: Diagnosis not present

## 2022-03-17 DIAGNOSIS — Z820 Family history of epilepsy and other diseases of the nervous system: Secondary | ICD-10-CM

## 2022-03-17 DIAGNOSIS — Z7982 Long term (current) use of aspirin: Secondary | ICD-10-CM

## 2022-03-17 DIAGNOSIS — Z803 Family history of malignant neoplasm of breast: Secondary | ICD-10-CM

## 2022-03-17 DIAGNOSIS — K219 Gastro-esophageal reflux disease without esophagitis: Secondary | ICD-10-CM | POA: Diagnosis present

## 2022-03-17 DIAGNOSIS — Z823 Family history of stroke: Secondary | ICD-10-CM

## 2022-03-17 DIAGNOSIS — F419 Anxiety disorder, unspecified: Secondary | ICD-10-CM | POA: Diagnosis present

## 2022-03-17 DIAGNOSIS — Z89421 Acquired absence of other right toe(s): Secondary | ICD-10-CM | POA: Diagnosis not present

## 2022-03-17 DIAGNOSIS — I2510 Atherosclerotic heart disease of native coronary artery without angina pectoris: Secondary | ICD-10-CM | POA: Diagnosis not present

## 2022-03-17 DIAGNOSIS — I25810 Atherosclerosis of coronary artery bypass graft(s) without angina pectoris: Secondary | ICD-10-CM | POA: Diagnosis present

## 2022-03-17 DIAGNOSIS — Z885 Allergy status to narcotic agent status: Secondary | ICD-10-CM

## 2022-03-17 DIAGNOSIS — J9601 Acute respiratory failure with hypoxia: Secondary | ICD-10-CM | POA: Diagnosis not present

## 2022-03-17 DIAGNOSIS — I701 Atherosclerosis of renal artery: Secondary | ICD-10-CM | POA: Diagnosis present

## 2022-03-17 DIAGNOSIS — N2581 Secondary hyperparathyroidism of renal origin: Secondary | ICD-10-CM | POA: Diagnosis present

## 2022-03-17 DIAGNOSIS — I462 Cardiac arrest due to underlying cardiac condition: Secondary | ICD-10-CM | POA: Diagnosis not present

## 2022-03-17 DIAGNOSIS — Z888 Allergy status to other drugs, medicaments and biological substances status: Secondary | ICD-10-CM

## 2022-03-17 DIAGNOSIS — I10 Essential (primary) hypertension: Secondary | ICD-10-CM | POA: Diagnosis present

## 2022-03-17 DIAGNOSIS — D631 Anemia in chronic kidney disease: Secondary | ICD-10-CM | POA: Diagnosis present

## 2022-03-17 DIAGNOSIS — Z8616 Personal history of COVID-19: Secondary | ICD-10-CM | POA: Diagnosis not present

## 2022-03-17 DIAGNOSIS — Z833 Family history of diabetes mellitus: Secondary | ICD-10-CM

## 2022-03-17 DIAGNOSIS — E038 Other specified hypothyroidism: Secondary | ICD-10-CM

## 2022-03-17 DIAGNOSIS — N289 Disorder of kidney and ureter, unspecified: Secondary | ICD-10-CM | POA: Diagnosis not present

## 2022-03-17 DIAGNOSIS — Z96643 Presence of artificial hip joint, bilateral: Secondary | ICD-10-CM | POA: Diagnosis present

## 2022-03-17 DIAGNOSIS — R092 Respiratory arrest: Secondary | ICD-10-CM

## 2022-03-17 DIAGNOSIS — S31104A Unspecified open wound of abdominal wall, left lower quadrant without penetration into peritoneal cavity, initial encounter: Secondary | ICD-10-CM | POA: Diagnosis not present

## 2022-03-17 DIAGNOSIS — Z880 Allergy status to penicillin: Secondary | ICD-10-CM

## 2022-03-17 DIAGNOSIS — Z87891 Personal history of nicotine dependence: Secondary | ICD-10-CM

## 2022-03-17 DIAGNOSIS — Z90710 Acquired absence of both cervix and uterus: Secondary | ICD-10-CM

## 2022-03-17 DIAGNOSIS — N184 Chronic kidney disease, stage 4 (severe): Secondary | ICD-10-CM | POA: Diagnosis not present

## 2022-03-17 DIAGNOSIS — I70229 Atherosclerosis of native arteries of extremities with rest pain, unspecified extremity: Secondary | ICD-10-CM | POA: Diagnosis not present

## 2022-03-17 DIAGNOSIS — N183 Chronic kidney disease, stage 3 unspecified: Secondary | ICD-10-CM

## 2022-03-17 HISTORY — DX: Shortness of breath: R06.02

## 2022-03-17 HISTORY — DX: Chronic pain syndrome: G89.4

## 2022-03-17 HISTORY — DX: Long term (current) use of antithrombotics/antiplatelets: Z79.02

## 2022-03-17 HISTORY — DX: Spondylosis without myelopathy or radiculopathy, cervical region: M47.812

## 2022-03-17 HISTORY — DX: Peripheral vascular disease, unspecified: I73.9

## 2022-03-17 HISTORY — PX: ENDARTERECTOMY FEMORAL: SHX5804

## 2022-03-17 HISTORY — DX: Spinal stenosis, lumbar region with neurogenic claudication: M48.062

## 2022-03-17 HISTORY — PX: INSERTION OF ILIAC STENT: SHX6256

## 2022-03-17 HISTORY — DX: Other ill-defined heart diseases: I51.89

## 2022-03-17 LAB — CBC WITH DIFFERENTIAL/PLATELET
Abs Immature Granulocytes: 0.04 10*3/uL (ref 0.00–0.07)
Basophils Absolute: 0 10*3/uL (ref 0.0–0.1)
Basophils Relative: 0 %
Eosinophils Absolute: 0 10*3/uL (ref 0.0–0.5)
Eosinophils Relative: 0 %
HCT: 27.9 % — ABNORMAL LOW (ref 36.0–46.0)
Hemoglobin: 9.1 g/dL — ABNORMAL LOW (ref 12.0–15.0)
Immature Granulocytes: 1 %
Lymphocytes Relative: 14 %
Lymphs Abs: 1.2 10*3/uL (ref 0.7–4.0)
MCH: 28.5 pg (ref 26.0–34.0)
MCHC: 32.6 g/dL (ref 30.0–36.0)
MCV: 87.5 fL (ref 80.0–100.0)
Monocytes Absolute: 0.1 10*3/uL (ref 0.1–1.0)
Monocytes Relative: 2 %
Neutro Abs: 7.3 10*3/uL (ref 1.7–7.7)
Neutrophils Relative %: 83 %
Platelets: 213 10*3/uL (ref 150–400)
RBC: 3.19 MIL/uL — ABNORMAL LOW (ref 3.87–5.11)
RDW: 13.2 % (ref 11.5–15.5)
WBC: 8.7 10*3/uL (ref 4.0–10.5)
nRBC: 0 % (ref 0.0–0.2)

## 2022-03-17 LAB — BLOOD GAS, ARTERIAL
Acid-base deficit: 2.4 mmol/L — ABNORMAL HIGH (ref 0.0–2.0)
Bicarbonate: 23.2 mmol/L (ref 20.0–28.0)
Drawn by: 30136
FIO2: 40 %
MECHVT: 380 mL
O2 Saturation: 99.6 %
PEEP: 5 cmH2O
Patient temperature: 36.1
RATE: 18 resp/min
pCO2 arterial: 40 mmHg (ref 32–48)
pH, Arterial: 7.36 (ref 7.35–7.45)
pO2, Arterial: 174 mmHg — ABNORMAL HIGH (ref 83–108)

## 2022-03-17 LAB — COMPREHENSIVE METABOLIC PANEL
ALT: 57 U/L — ABNORMAL HIGH (ref 0–44)
AST: 76 U/L — ABNORMAL HIGH (ref 15–41)
Albumin: 2.7 g/dL — ABNORMAL LOW (ref 3.5–5.0)
Alkaline Phosphatase: 50 U/L (ref 38–126)
Anion gap: 7 (ref 5–15)
BUN: 20 mg/dL (ref 8–23)
CO2: 21 mmol/L — ABNORMAL LOW (ref 22–32)
Calcium: 8.1 mg/dL — ABNORMAL LOW (ref 8.9–10.3)
Chloride: 106 mmol/L (ref 98–111)
Creatinine, Ser: 1.52 mg/dL — ABNORMAL HIGH (ref 0.44–1.00)
GFR, Estimated: 34 mL/min — ABNORMAL LOW (ref >60)
Glucose, Bld: 236 mg/dL — ABNORMAL HIGH (ref 70–99)
Potassium: 5.4 mmol/L — ABNORMAL HIGH (ref 3.5–5.1)
Sodium: 134 mmol/L — ABNORMAL LOW (ref 135–145)
Total Bilirubin: 0.7 mg/dL (ref 0.3–1.2)
Total Protein: 5.6 g/dL — ABNORMAL LOW (ref 6.5–8.1)

## 2022-03-17 LAB — GLUCOSE, CAPILLARY
Glucose-Capillary: 126 mg/dL — ABNORMAL HIGH (ref 70–99)
Glucose-Capillary: 173 mg/dL — ABNORMAL HIGH (ref 70–99)
Glucose-Capillary: 224 mg/dL — ABNORMAL HIGH (ref 70–99)
Glucose-Capillary: 243 mg/dL — ABNORMAL HIGH (ref 70–99)
Glucose-Capillary: 73 mg/dL (ref 70–99)

## 2022-03-17 LAB — ABO/RH: ABO/RH(D): B POS

## 2022-03-17 LAB — PHOSPHORUS: Phosphorus: 5 mg/dL — ABNORMAL HIGH (ref 2.5–4.6)

## 2022-03-17 LAB — TROPONIN I (HIGH SENSITIVITY): Troponin I (High Sensitivity): 9 ng/L (ref <18)

## 2022-03-17 LAB — MRSA NEXT GEN BY PCR, NASAL: MRSA by PCR Next Gen: NOT DETECTED

## 2022-03-17 LAB — LACTIC ACID, PLASMA
Lactic Acid, Venous: 2.2 mmol/L (ref 0.5–1.9)
Lactic Acid, Venous: 0.9 mmol/L (ref 0.5–1.9)

## 2022-03-17 LAB — MAGNESIUM: Magnesium: 1.9 mg/dL (ref 1.7–2.4)

## 2022-03-17 LAB — CK: Total CK: 189 U/L (ref 38–234)

## 2022-03-17 SURGERY — ENDARTERECTOMY, FEMORAL
Anesthesia: General | Site: Groin

## 2022-03-17 MED ORDER — PROPOFOL 10 MG/ML IV BOLUS
INTRAVENOUS | Status: AC
  Filled 2022-03-17: qty 40

## 2022-03-17 MED ORDER — ACETAMINOPHEN 10 MG/ML IV SOLN
INTRAVENOUS | Status: DC | PRN
Start: 2022-03-17 — End: 2022-03-17
  Administered 2022-03-17: 1000 mg via INTRAVENOUS

## 2022-03-17 MED ORDER — ONDANSETRON HCL 4 MG/2ML IJ SOLN
INTRAMUSCULAR | Status: DC | PRN
  Administered 2022-03-17: 4 mg via INTRAVENOUS

## 2022-03-17 MED ORDER — LABETALOL HCL 5 MG/ML IV SOLN
INTRAVENOUS | Status: DC | PRN
  Administered 2022-03-17: 5 mg via INTRAVENOUS

## 2022-03-17 MED ORDER — ALUM & MAG HYDROXIDE-SIMETH 200-200-20 MG/5ML PO SUSP: 15.0000 mL | ORAL | Status: DC | PRN

## 2022-03-17 MED ORDER — MAGNESIUM SULFATE 2 GM/50ML IV SOLN: 2.0000 g | Freq: Every day | INTRAVENOUS | Status: DC | PRN

## 2022-03-17 MED ORDER — VISTASEAL 10 ML SINGLE DOSE KIT
PACK | CUTANEOUS | Status: AC
  Filled 2022-03-17: qty 10

## 2022-03-17 MED ORDER — POTASSIUM CHLORIDE CRYS ER 20 MEQ PO TBCR: 20.0000 meq | EXTENDED_RELEASE_TABLET | Freq: Every day | ORAL | Status: DC | PRN

## 2022-03-17 MED ORDER — PROPOFOL 10 MG/ML IV BOLUS
INTRAVENOUS | Status: DC | PRN
  Administered 2022-03-17: 30 mg via INTRAVENOUS
  Administered 2022-03-17: 70 mg via INTRAVENOUS
  Administered 2022-03-17: 50 mg via INTRAVENOUS
  Administered 2022-03-17: 20 mg via INTRAVENOUS

## 2022-03-17 MED ORDER — ACETAMINOPHEN 650 MG RE SUPP: 325.0000 mg | RECTAL | Status: DC | PRN

## 2022-03-17 MED ORDER — FENTANYL CITRATE (PF) 100 MCG/2ML IJ SOLN
25.0000 ug | INTRAMUSCULAR | Status: AC | PRN
  Administered 2022-03-17 (×4): 25 ug via INTRAVENOUS

## 2022-03-17 MED ORDER — HEPARIN 5000 UNITS IN NS 1000 ML (FLUSH)
INTRAMUSCULAR | Status: DC | PRN
  Administered 2022-03-17: 200 mL via INTRAMUSCULAR

## 2022-03-17 MED ORDER — ONDANSETRON HCL 4 MG/2ML IJ SOLN: 4.0000 mg | Freq: Four times a day (QID) | INTRAMUSCULAR | Status: DC | PRN

## 2022-03-17 MED ORDER — MORPHINE SULFATE (PF) 2 MG/ML IV SOLN
2.0000 mg | INTRAVENOUS | Status: DC | PRN
  Administered 2022-03-17 – 2022-03-19 (×3): 2 mg via INTRAVENOUS
  Filled 2022-03-17 (×3): qty 1

## 2022-03-17 MED ORDER — HEPARIN SODIUM (PORCINE) 5000 UNIT/ML IJ SOLN
INTRAMUSCULAR | Status: AC
  Filled 2022-03-17: qty 1

## 2022-03-17 MED ORDER — SORBITOL 70 % SOLN: 30.0000 mL | Freq: Every day | Status: DC | PRN

## 2022-03-17 MED ORDER — OXYCODONE-ACETAMINOPHEN 5-325 MG PO TABS
1.0000 | ORAL_TABLET | ORAL | Status: DC | PRN
  Administered 2022-03-17: 2 via ORAL
  Administered 2022-03-18: 1 via ORAL
  Administered 2022-03-18 (×3): 2 via ORAL
  Administered 2022-03-19: 1 via ORAL
  Administered 2022-03-19: 2 via ORAL
  Administered 2022-03-19 (×2): 1 via ORAL
  Administered 2022-03-20 – 2022-03-23 (×9): 2 via ORAL
  Administered 2022-03-23: 1 via ORAL
  Administered 2022-03-23 – 2022-03-24 (×3): 2 via ORAL
  Filled 2022-03-17 (×6): qty 2
  Filled 2022-03-17: qty 1
  Filled 2022-03-17 (×5): qty 2
  Filled 2022-03-17 (×2): qty 1
  Filled 2022-03-17 (×2): qty 2
  Filled 2022-03-17: qty 1
  Filled 2022-03-17 (×4): qty 2
  Filled 2022-03-17: qty 1
  Filled 2022-03-17: qty 2

## 2022-03-17 MED ORDER — HYDRALAZINE HCL 50 MG PO TABS
100.0000 mg | ORAL_TABLET | Freq: Three times a day (TID) | ORAL | Status: DC
  Administered 2022-03-18 – 2022-03-23 (×7): 100 mg via ORAL
  Filled 2022-03-17 (×13): qty 2

## 2022-03-17 MED ORDER — BUPIVACAINE LIPOSOME 1.3 % IJ SUSP
INTRAMUSCULAR | Status: AC
  Filled 2022-03-17: qty 20

## 2022-03-17 MED ORDER — PHENYLEPHRINE HCL-NACL 20-0.9 MG/250ML-% IV SOLN
INTRAVENOUS | Status: AC
  Filled 2022-03-17: qty 250

## 2022-03-17 MED ORDER — DOCUSATE SODIUM 100 MG PO CAPS
100.0000 mg | ORAL_CAPSULE | Freq: Every day | ORAL | Status: DC
  Administered 2022-03-18: 100 mg via ORAL
  Filled 2022-03-17: qty 1

## 2022-03-17 MED ORDER — GLYCOPYRROLATE 0.2 MG/ML IJ SOLN
INTRAMUSCULAR | Status: DC | PRN
  Administered 2022-03-17: .1 mg via INTRAVENOUS

## 2022-03-17 MED ORDER — PHENYLEPHRINE 80 MCG/ML (10ML) SYRINGE FOR IV PUSH (FOR BLOOD PRESSURE SUPPORT)
PREFILLED_SYRINGE | INTRAVENOUS | Status: AC
  Filled 2022-03-17: qty 10

## 2022-03-17 MED ORDER — FERROUS SULFATE 325 (65 FE) MG PO TABS
325.0000 mg | ORAL_TABLET | Freq: Two times a day (BID) | ORAL | Status: DC
  Administered 2022-03-18 – 2022-03-19 (×3): 325 mg via ORAL
  Filled 2022-03-17 (×3): qty 1

## 2022-03-17 MED ORDER — FENTANYL CITRATE (PF) 100 MCG/2ML IJ SOLN
INTRAMUSCULAR | Status: AC
  Filled 2022-03-17: qty 2

## 2022-03-17 MED ORDER — DEXAMETHASONE SODIUM PHOSPHATE 10 MG/ML IJ SOLN
INTRAMUSCULAR | Status: DC | PRN
  Administered 2022-03-17: 10 mg via INTRAVENOUS

## 2022-03-17 MED ORDER — LIDOCAINE HCL (CARDIAC) PF 100 MG/5ML IV SOSY
PREFILLED_SYRINGE | INTRAVENOUS | Status: DC | PRN
  Administered 2022-03-17: 80 mg via INTRAVENOUS

## 2022-03-17 MED ORDER — VANCOMYCIN HCL IN DEXTROSE 1-5 GM/200ML-% IV SOLN
INTRAVENOUS | Status: AC
  Filled 2022-03-17: qty 200

## 2022-03-17 MED ORDER — CLOPIDOGREL BISULFATE 75 MG PO TABS
75.0000 mg | ORAL_TABLET | Freq: Every day | ORAL | Status: DC
  Administered 2022-03-18 – 2022-03-24 (×7): 75 mg via ORAL
  Filled 2022-03-17 (×7): qty 1

## 2022-03-17 MED ORDER — IRBESARTAN 150 MG PO TABS
300.0000 mg | ORAL_TABLET | Freq: Every day | ORAL | Status: DC
  Administered 2022-03-19 – 2022-03-24 (×6): 300 mg via ORAL
  Filled 2022-03-17 (×7): qty 2

## 2022-03-17 MED ORDER — FENTANYL CITRATE (PF) 100 MCG/2ML IJ SOLN
INTRAMUSCULAR | Status: AC
  Administered 2022-03-17: 25 ug via INTRAVENOUS
  Filled 2022-03-17: qty 2

## 2022-03-17 MED ORDER — FAMOTIDINE IN NACL 20-0.9 MG/50ML-% IV SOLN
20.0000 mg | Freq: Two times a day (BID) | INTRAVENOUS | Status: DC
  Administered 2022-03-17 – 2022-03-18 (×2): 20 mg via INTRAVENOUS
  Filled 2022-03-17 (×2): qty 50

## 2022-03-17 MED ORDER — PHENYLEPHRINE HCL-NACL 20-0.9 MG/250ML-% IV SOLN
INTRAVENOUS | Status: DC | PRN
  Administered 2022-03-17: 50 ug/min via INTRAVENOUS

## 2022-03-17 MED ORDER — GUAIFENESIN-DM 100-10 MG/5ML PO SYRP: 15.0000 mL | ORAL_SOLUTION | ORAL | Status: DC | PRN

## 2022-03-17 MED ORDER — PHENOL 1.4 % MT LIQD: 1.0000 | OROMUCOSAL | Status: DC | PRN

## 2022-03-17 MED ORDER — PANTOPRAZOLE SODIUM 40 MG PO TBEC
40.0000 mg | DELAYED_RELEASE_TABLET | Freq: Every day | ORAL | Status: DC
  Administered 2022-03-18: 40 mg via ORAL
  Filled 2022-03-17: qty 1

## 2022-03-17 MED ORDER — NORTRIPTYLINE HCL 10 MG PO CAPS
10.0000 mg | ORAL_CAPSULE | Freq: Two times a day (BID) | ORAL | Status: DC
  Administered 2022-03-17 – 2022-03-24 (×14): 10 mg via ORAL
  Filled 2022-03-17 (×15): qty 1

## 2022-03-17 MED ORDER — ROCURONIUM BROMIDE 100 MG/10ML IV SOLN
INTRAVENOUS | Status: DC | PRN
  Administered 2022-03-17: 30 mg via INTRAVENOUS
  Administered 2022-03-17 (×3): 20 mg via INTRAVENOUS
  Administered 2022-03-17: 50 mg via INTRAVENOUS

## 2022-03-17 MED ORDER — ALBUMIN HUMAN 5 % IV SOLN
INTRAVENOUS | Status: AC
  Filled 2022-03-17: qty 250

## 2022-03-17 MED ORDER — HYDRALAZINE HCL 20 MG/ML IJ SOLN: 5.0000 mg | INTRAMUSCULAR | Status: DC | PRN

## 2022-03-17 MED ORDER — ASPIRIN 81 MG PO TBEC
81.0000 mg | DELAYED_RELEASE_TABLET | Freq: Every day | ORAL | Status: DC
  Administered 2022-03-18: 81 mg via ORAL
  Filled 2022-03-17: qty 1

## 2022-03-17 MED ORDER — MIDAZOLAM HCL 2 MG/2ML IJ SOLN
INTRAMUSCULAR | Status: AC
  Filled 2022-03-17: qty 2

## 2022-03-17 MED ORDER — ROCURONIUM BROMIDE 10 MG/ML (PF) SYRINGE
PREFILLED_SYRINGE | INTRAVENOUS | Status: AC
  Filled 2022-03-17: qty 10

## 2022-03-17 MED ORDER — INSULIN ASPART 100 UNIT/ML IJ SOLN: 7.0000 [IU] | Freq: Once | INTRAMUSCULAR | Status: AC

## 2022-03-17 MED ORDER — RANOLAZINE ER 500 MG PO TB12
500.0000 mg | ORAL_TABLET | Freq: Two times a day (BID) | ORAL | Status: DC
Start: 2022-03-17 — End: 2022-03-24
  Administered 2022-03-17 – 2022-03-24 (×14): 500 mg via ORAL
  Filled 2022-03-17 (×15): qty 1

## 2022-03-17 MED ORDER — SODIUM CHLORIDE 0.9 % IV SOLN
INTRAVENOUS | Status: DC | PRN
Start: 2022-03-17 — End: 2022-03-17

## 2022-03-17 MED ORDER — ASCORBIC ACID 500 MG PO TABS
500.0000 mg | ORAL_TABLET | Freq: Every day | ORAL | Status: DC
  Administered 2022-03-18: 500 mg via ORAL
  Filled 2022-03-17: qty 1

## 2022-03-17 MED ORDER — SENNOSIDES-DOCUSATE SODIUM 8.6-50 MG PO TABS: 1.0000 | ORAL_TABLET | Freq: Every evening | ORAL | Status: DC | PRN

## 2022-03-17 MED ORDER — FENTANYL CITRATE PF 50 MCG/ML IJ SOSY: 12.5000 ug | PREFILLED_SYRINGE | Freq: Once | INTRAMUSCULAR | Status: DC | PRN

## 2022-03-17 MED ORDER — SUGAMMADEX SODIUM 200 MG/2ML IV SOLN
INTRAVENOUS | Status: DC | PRN
  Administered 2022-03-17: 100 mg via INTRAVENOUS
  Administered 2022-03-17: 200 mg via INTRAVENOUS

## 2022-03-17 MED ORDER — LABETALOL HCL 200 MG PO TABS
300.0000 mg | ORAL_TABLET | Freq: Two times a day (BID) | ORAL | Status: DC
  Administered 2022-03-18 – 2022-03-24 (×11): 300 mg via ORAL
  Filled 2022-03-17 (×3): qty 1
  Filled 2022-03-17: qty 2
  Filled 2022-03-17 (×5): qty 1
  Filled 2022-03-17: qty 2
  Filled 2022-03-17: qty 1
  Filled 2022-03-17: qty 2
  Filled 2022-03-17 (×2): qty 1

## 2022-03-17 MED ORDER — LABETALOL HCL 5 MG/ML IV SOLN: 10.0000 mg | INTRAVENOUS | Status: DC | PRN

## 2022-03-17 MED ORDER — SODIUM CHLORIDE 0.9 % IV SOLN
500.0000 mL | Freq: Once | INTRAVENOUS | Status: DC | PRN
Start: 2022-03-17 — End: 2022-03-18

## 2022-03-17 MED ORDER — HEPARIN SODIUM (PORCINE) 1000 UNIT/ML IJ SOLN
INTRAMUSCULAR | Status: DC | PRN
  Administered 2022-03-17: 6000 [IU] via INTRAVENOUS
  Administered 2022-03-17: 1000 [IU] via INTRAVENOUS

## 2022-03-17 MED ORDER — EPHEDRINE SULFATE (PRESSORS) 50 MG/ML IJ SOLN
INTRAMUSCULAR | Status: DC | PRN
  Administered 2022-03-17 (×3): 2.5 mg via INTRAVENOUS
  Administered 2022-03-17: 5 mg via INTRAVENOUS
  Administered 2022-03-17 (×4): 2.5 mg via INTRAVENOUS

## 2022-03-17 MED ORDER — VISTASEAL 10 ML SINGLE DOSE KIT
PACK | CUTANEOUS | Status: DC | PRN
  Administered 2022-03-17: 10 mL via TOPICAL

## 2022-03-17 MED ORDER — ENOXAPARIN SODIUM 40 MG/0.4ML IJ SOSY: 40.0000 mg | PREFILLED_SYRINGE | INTRAMUSCULAR | Status: DC

## 2022-03-17 MED ORDER — CHLORHEXIDINE GLUCONATE CLOTH 2 % EX PADS
6.0000 | MEDICATED_PAD | Freq: Every day | CUTANEOUS | Status: DC
  Administered 2022-03-18: 6 via TOPICAL

## 2022-03-17 MED ORDER — PHENYLEPHRINE 80 MCG/ML (10ML) SYRINGE FOR IV PUSH (FOR BLOOD PRESSURE SUPPORT)
PREFILLED_SYRINGE | INTRAVENOUS | Status: DC | PRN
  Administered 2022-03-17: 80 ug via INTRAVENOUS
  Administered 2022-03-17: 40 ug via INTRAVENOUS
  Administered 2022-03-17: 80 ug via INTRAVENOUS

## 2022-03-17 MED ORDER — DOPAMINE-DEXTROSE 3.2-5 MG/ML-% IV SOLN: 3.0000 ug/kg/min | INTRAVENOUS | Status: DC

## 2022-03-17 MED ORDER — ATROPINE SULFATE 1 MG/10ML IJ SOSY
PREFILLED_SYRINGE | INTRAMUSCULAR | Status: AC
  Filled 2022-03-17: qty 10

## 2022-03-17 MED ORDER — SUCCINYLCHOLINE CHLORIDE 200 MG/10ML IV SOSY
PREFILLED_SYRINGE | INTRAVENOUS | Status: AC
  Filled 2022-03-17: qty 10

## 2022-03-17 MED ORDER — ACETAMINOPHEN 10 MG/ML IV SOLN
INTRAVENOUS | Status: AC
  Filled 2022-03-17: qty 100

## 2022-03-17 MED ORDER — NITROGLYCERIN IN D5W 200-5 MCG/ML-% IV SOLN
INTRAVENOUS | Status: AC
  Filled 2022-03-17: qty 250

## 2022-03-17 MED ORDER — ACETAMINOPHEN 325 MG PO TABS
325.0000 mg | ORAL_TABLET | ORAL | Status: DC | PRN
  Administered 2022-03-21: 325 mg via ORAL
  Filled 2022-03-17: qty 2

## 2022-03-17 MED ORDER — FENTANYL CITRATE (PF) 100 MCG/2ML IJ SOLN
INTRAMUSCULAR | Status: AC
  Administered 2022-03-17: 50 ug via INTRAVENOUS
  Filled 2022-03-17: qty 2

## 2022-03-17 MED ORDER — ONDANSETRON HCL 4 MG/2ML IJ SOLN
INTRAMUSCULAR | Status: AC
  Filled 2022-03-17: qty 2

## 2022-03-17 MED ORDER — METOPROLOL TARTRATE 5 MG/5ML IV SOLN: 2.0000 mg | INTRAVENOUS | Status: DC | PRN

## 2022-03-17 MED ORDER — NITROGLYCERIN IN D5W 200-5 MCG/ML-% IV SOLN
5.0000 ug/min | INTRAVENOUS | Status: DC
  Filled 2022-03-17: qty 250

## 2022-03-17 MED ORDER — HEPARIN 30,000 UNITS/1000 ML (OHS) CELLSAVER SOLUTION
Status: AC
  Filled 2022-03-17: qty 1000

## 2022-03-17 MED ORDER — FENTANYL CITRATE (PF) 100 MCG/2ML IJ SOLN
INTRAMUSCULAR | Status: DC | PRN
  Administered 2022-03-17 (×4): 50 ug via INTRAVENOUS

## 2022-03-17 MED ORDER — INSULIN ASPART 100 UNIT/ML IJ SOLN
INTRAMUSCULAR | Status: AC
  Administered 2022-03-17: 7 [IU] via SUBCUTANEOUS
  Filled 2022-03-17: qty 1

## 2022-03-17 MED ORDER — DOCUSATE SODIUM 100 MG PO CAPS: 100.0000 mg | ORAL_CAPSULE | Freq: Every day | ORAL | Status: DC | PRN

## 2022-03-17 MED ORDER — SODIUM CHLORIDE 0.9 % IV SOLN: INTRAVENOUS | Status: DC

## 2022-03-17 MED ORDER — CHLORHEXIDINE GLUCONATE 0.12 % MT SOLN
OROMUCOSAL | Status: AC
  Administered 2022-03-17: 15 mL via OROMUCOSAL
  Filled 2022-03-17: qty 15

## 2022-03-17 MED ORDER — ATROPINE SULFATE 1 MG/ML IV SOLN: INTRAVENOUS | Status: DC | PRN

## 2022-03-17 MED ORDER — OMEGA-3-ACID ETHYL ESTERS 1 G PO CAPS
1000.0000 mg | ORAL_CAPSULE | Freq: Two times a day (BID) | ORAL | Status: DC
  Administered 2022-03-17 – 2022-03-23 (×12): 1000 mg via ORAL
  Filled 2022-03-17 (×12): qty 1

## 2022-03-17 MED ORDER — EZETIMIBE 10 MG PO TABS
10.0000 mg | ORAL_TABLET | Freq: Every day | ORAL | Status: DC
  Administered 2022-03-18 – 2022-03-24 (×7): 10 mg via ORAL
  Filled 2022-03-17 (×7): qty 1

## 2022-03-17 MED ORDER — DEXAMETHASONE SODIUM PHOSPHATE 10 MG/ML IJ SOLN
INTRAMUSCULAR | Status: AC
Start: 2022-03-17 — End: ?
  Filled 2022-03-17: qty 1

## 2022-03-17 MED ORDER — 0.9 % SODIUM CHLORIDE (POUR BTL) OPTIME
TOPICAL | Status: DC | PRN
  Administered 2022-03-17: 500 mL

## 2022-03-17 MED ORDER — HYDRALAZINE HCL 20 MG/ML IJ SOLN
INTRAMUSCULAR | Status: DC | PRN
  Administered 2022-03-17: 10 mg via INTRAVENOUS

## 2022-03-17 MED ORDER — BUPIVACAINE LIPOSOME 1.3 % IJ SUSP
INTRAMUSCULAR | Status: DC | PRN
  Administered 2022-03-17: 50 mL

## 2022-03-17 MED ORDER — LIDOCAINE HCL (PF) 2 % IJ SOLN
INTRAMUSCULAR | Status: AC
  Filled 2022-03-17: qty 5

## 2022-03-17 MED ORDER — ETOMIDATE 2 MG/ML IV SOLN
INTRAVENOUS | Status: AC
  Filled 2022-03-17: qty 20

## 2022-03-17 MED ORDER — VANCOMYCIN HCL IN DEXTROSE 1-5 GM/200ML-% IV SOLN
1000.0000 mg | Freq: Two times a day (BID) | INTRAVENOUS | Status: AC
  Administered 2022-03-17 – 2022-03-18 (×2): 1000 mg via INTRAVENOUS
  Filled 2022-03-17 (×3): qty 200

## 2022-03-17 MED ORDER — BUPIVACAINE HCL (PF) 0.5 % IJ SOLN
INTRAMUSCULAR | Status: AC
  Filled 2022-03-17: qty 30

## 2022-03-17 SURGICAL SUPPLY — 83 items
ADH SKN CLS APL DERMABOND .7 (GAUZE/BANDAGES/DRESSINGS) ×3
APL PRP STRL LF DISP 70% ISPRP (MISCELLANEOUS) ×3
BAG DECANTER FOR FLEXI CONT (MISCELLANEOUS) ×5 IMPLANT
BALLN LUTONIX AV 7X60X75 (BALLOONS) ×4
BALLOON LUTONIX AV 7X60X75 (BALLOONS) IMPLANT
BLADE SURG 15 STRL LF DISP TIS (BLADE) ×4 IMPLANT
BLADE SURG 15 STRL SS (BLADE) ×4
BLADE SURG SZ11 CARB STEEL (BLADE) ×5 IMPLANT
BOOT SUTURE AID YELLOW STND (SUTURE) ×5 IMPLANT
BRUSH SCRUB EZ  4% CHG (MISCELLANEOUS) ×4
BRUSH SCRUB EZ 4% CHG (MISCELLANEOUS) ×4 IMPLANT
CANNULA 5F STIFF (CANNULA) ×1 IMPLANT
CHLORAPREP W/TINT 26 (MISCELLANEOUS) ×5 IMPLANT
DERMABOND ADVANCED (GAUZE/BANDAGES/DRESSINGS) ×1
DERMABOND ADVANCED .7 DNX12 (GAUZE/BANDAGES/DRESSINGS) ×4 IMPLANT
DRAPE C-ARM XRAY 36X54 (DRAPES) ×5 IMPLANT
DRAPE INCISE IOBAN 66X45 STRL (DRAPES) ×5 IMPLANT
DRESSING PEEL AND PLC PRVNA 13 (GAUZE/BANDAGES/DRESSINGS) IMPLANT
DRESSING SURGICEL FIBRLLR 1X2 (HEMOSTASIS) ×4 IMPLANT
DRSG OPSITE POSTOP 4X6 (GAUZE/BANDAGES/DRESSINGS) ×5 IMPLANT
DRSG PEEL AND PLACE PREVENA 13 (GAUZE/BANDAGES/DRESSINGS) ×8
DRSG SURGICEL FIBRILLAR 1X2 (HEMOSTASIS) ×4
ELECT CAUTERY BLADE 6.4 (BLADE) ×5 IMPLANT
ELECT REM PT RETURN 9FT ADLT (ELECTROSURGICAL) ×4
ELECTRODE REM PT RTRN 9FT ADLT (ELECTROSURGICAL) ×4 IMPLANT
GAUZE 4X4 16PLY ~~LOC~~+RFID DBL (SPONGE) ×5 IMPLANT
GLOVE SURG SYN 7.0 (GLOVE) ×8 IMPLANT
GLOVE SURG SYN 7.0 PF PI (GLOVE) ×8 IMPLANT
GLOVE SURG SYN 8.0 (GLOVE) ×4 IMPLANT
GLOVE SURG SYN 8.0 PF PI (GLOVE) ×4 IMPLANT
GOWN STRL REUS W/ TWL LRG LVL3 (GOWN DISPOSABLE) ×8 IMPLANT
GOWN STRL REUS W/ TWL XL LVL3 (GOWN DISPOSABLE) ×8 IMPLANT
GOWN STRL REUS W/TWL LRG LVL3 (GOWN DISPOSABLE) ×8
GOWN STRL REUS W/TWL XL LVL3 (GOWN DISPOSABLE) ×8
IV NS 500ML (IV SOLUTION) ×4
IV NS 500ML BAXH (IV SOLUTION) ×4 IMPLANT
KIT ENCORE 26 ADVANTAGE (KITS) ×1 IMPLANT
KIT PREVENA INCISION MGT 13 (CANNISTER) ×2 IMPLANT
KIT TURNOVER KIT A (KITS) ×5 IMPLANT
LABEL OR SOLS (LABEL) ×5 IMPLANT
LOOP RED MAXI  1X406MM (MISCELLANEOUS) ×5
LOOP VESSEL MAXI  1X406 RED (MISCELLANEOUS) ×15
LOOP VESSEL MAXI 1X406 RED (MISCELLANEOUS) ×8 IMPLANT
LOOP VESSEL MINI 0.8X406 BLUE (MISCELLANEOUS) ×12 IMPLANT
LOOPS BLUE MINI 0.8X406MM (MISCELLANEOUS) ×3
MANIFOLD NEPTUNE II (INSTRUMENTS) ×5 IMPLANT
NDL HYPO 18GX1.5 BLUNT FILL (NEEDLE) ×4 IMPLANT
NEEDLE HYPO 18GX1.5 BLUNT FILL (NEEDLE) ×4 IMPLANT
NS IRRIG 500ML POUR BTL (IV SOLUTION) ×5 IMPLANT
PACK ANGIOGRAPHY (CUSTOM PROCEDURE TRAY) ×1 IMPLANT
PACK BASIN MAJOR ARMC (MISCELLANEOUS) ×5 IMPLANT
PACK UNIVERSAL (MISCELLANEOUS) ×5 IMPLANT
PATCH CAROTID ECM VASC 1X10 (Prosthesis & Implant Heart) ×2 IMPLANT
PENCIL ELECTRO HAND CTR (MISCELLANEOUS) ×1 IMPLANT
RETRACTOR TRAXI PANNICULUS (MISCELLANEOUS) IMPLANT
SET WALTER ACTIVATION W/DRAPE (SET/KITS/TRAYS/PACK) ×6 IMPLANT
SHEATH BRITE TIP 6FRX11 (SHEATH) ×1 IMPLANT
SPIKE FLUID TRANSFER (MISCELLANEOUS) ×5 IMPLANT
SPONGE T-LAP 18X18 ~~LOC~~+RFID (SPONGE) ×10 IMPLANT
STAPLER SKIN PROX 35W (STAPLE) ×2 IMPLANT
STENT LIFESTAR 8X40 (Permanent Stent) ×1 IMPLANT
SUT MNCRL+ 5-0 UNDYED PC-3 (SUTURE) ×4 IMPLANT
SUT MONOCRYL 5-0 (SUTURE) ×4
SUT PROLENE 5 0 RB 1 DA (SUTURE) ×16 IMPLANT
SUT PROLENE 6 0 BV (SUTURE) ×28 IMPLANT
SUT PROLENE 7 0 BV 1 (SUTURE) ×13 IMPLANT
SUT SILK 2 0 (SUTURE) ×8
SUT SILK 2-0 18XBRD TIE 12 (SUTURE) ×4 IMPLANT
SUT SILK 3 0 (SUTURE) ×4
SUT SILK 3-0 18XBRD TIE 12 (SUTURE) ×4 IMPLANT
SUT SILK 4 0 (SUTURE) ×4
SUT SILK 4-0 18XBRD TIE 12 (SUTURE) ×4 IMPLANT
SUT VIC AB 2-0 CT1 27 (SUTURE) ×12
SUT VIC AB 2-0 CT1 TAPERPNT 27 (SUTURE) ×8 IMPLANT
SUT VIC AB 3-0 SH 27 (SUTURE) ×4
SUT VIC AB 3-0 SH 27X BRD (SUTURE) ×4 IMPLANT
SUT VICRYL+ 3-0 36IN CT-1 (SUTURE) ×10 IMPLANT
SYR 20ML LL LF (SYRINGE) ×5 IMPLANT
SYR 5ML LL (SYRINGE) ×5 IMPLANT
TRAXI PANNICULUS RETRACTOR (MISCELLANEOUS) ×1
TRAY FOLEY MTR SLVR 16FR STAT (SET/KITS/TRAYS/PACK) ×5 IMPLANT
WATER STERILE IRR 500ML POUR (IV SOLUTION) ×5 IMPLANT
WIRE MAGIC TOR.035 180C (WIRE) ×1 IMPLANT

## 2022-03-17 NOTE — Op Note (Addendum)
OPERATIVE NOTE   PROCEDURE: Bilateral common femoral, superficial femoral and profunda femoris endarterectomy with Cormatrix patch angioplasty. Introduction catheter into aorta left common femoral approach. Open angioplasty and stent placement to the left external iliac artery.  PRE-OPERATIVE DIAGNOSIS: Atherosclerotic occlusive disease bilateral lower extremities with lifestyle limiting claudication and mild rest pain symptoms; hypertension   POST-OPERATIVE DIAGNOSIS: Same  CO-SURGEON: Katha Cabal, MD and Algernon Huxley, M.D.  ASSISTANT(S): None  ANESTHESIA: general  ESTIMATED BLOOD LOSS: 450 cc  FINDING(S): Profound calcific plaque noted bilaterally extending past the initial bifurcation of the profunda femoris arteries as well as down the extensive length of the SFA  SPECIMEN(S):  Calcific plaque from the common femoral, superficial femoral and the profunda femoris arteries bilaterally  INDICATIONS:   Sabrina [name] 81 y.o. y.o.female who presents with complaints of lifestyle limiting claudication and pain continuously in the feet bilaterally. The patient has documented severe atherosclerotic occlusive disease and has undergone multiple minimally invasive treatments in the past. However, at this point his primary area of stricture stenosis resides in the common femoral and origins of the superficial femoral and profunda femoris extending into these arteries and therefore this is not amenable to intervention and he is now undergoing open endarterectomy. The risks and benefits of been reviewed with the patient, all questions have answered; alternative therapies have been reviewed as well and the patient has agreed to proceed with surgical open repair.  DESCRIPTION: After obtaining full informed written consent, the patient was brought back to the operating room and placed supine upon the operating table.  The patient received IV antibiotics prior to induction.  After  obtaining adequate anesthesia, the patient was prepped and draped in the standard fashion for: bilateral femoral exposure.  Co-surgeons are required because this is a bilateral procedure with work being performed simultaneously from both the right femoral and left femoral approach.  This also expedite the procedure making a shorter operative time reducing complications and improving patient safety.  Attention was turned to the bilateral groin with Dr. Lucky Cowboy working on the right and myself working on the left of the patient.  Vertical  incisions were made over the common femoral artery and dissected down to the common femoral artery with electrocautery.  I dissected out the common femoral artery from the distal external iliac artery (identified by the superficial circumflex vessels) down to the femoral bifurcation.  On initial inspection, the common femoral artery was: Densely calcified and there was no palpable pulse noted bilaterally.    Subsequently the dissection was continued to include all circumflex branches and the profunda femoral artery and superficial femoral artery. The superficial femoral artery was dissected circumferentially for a distance of approximately 3-4 cm and the profunda femoris was dissected circumferentially out to the fourth order branches individual vessel loops were placed around each branch. Both of the groins were treated simultaneously as described above. Control of all branches was obtained with vessel loops.  A softer area in the distal external iliac artery amendable to clamping was identified.    The patient was given 6000 units of Heparin intravenously, which was a therapeutic bolus.   After waiting 3 minutes, the right groin was addressed first.  The right distal external iliac artery was clamped and placed all circumflex branches, and the profunda and superficial femoral arteries under tension.  Arteriotomy was made in the right common femoral artery with a 11-blade and  extended it with a Potts scissor proximally and distally extending the  distal end of the arteriotomy down the profunda femoris for approximately 3 cm.   Endarterectomy was then performed under direct visualization using a freer elevator and a right angle from the mid common femoral extending up both proximally and distally. Proximally the endarterectomy was brought up to the level of the clamp where a clean edge was obtained. Distally the endarterectomy was carried down to a soft spot in the SFA where a feathered edge would was obtained.  7-0 Prolene interrupted tacking sutures were placed to secure the leading edge of the plaque in the profunda femoris.  The right SFA was treated with an eversion technique extending endarterectomy approximately 2 cm distally.  At this point, we fashioned a core matrix patch for the geometry of the arteriotomy.  The patch was sewn to the artery with 2 running stitches of 6-0 Prolene, running from each end.  Prior to completing the patch angioplasty, the profunda femoral artery was flushed as was the superficial femoral artery. The system was then forward flushed. The endarterectomy site was then irrigated copiously with heparinized saline. The patch angioplasty was completed in the usual fashion.  Flow was then reestablished first to the profunda femoris and then the superficial femoral artery. Any gaps or bleeding sites in the suture line were easily controlled with a 6-0 Prolene suture.   Next the left groin was addressed.  The C-arm was brought into the field and had a level near the circumflex branches a microneedle was inserted followed by microwire.  Under fluoroscopy the wire was verified to be within the true lumen.  The left distal external iliac artery was clamped and placed all circumflex branches, and the profunda and superficial femoral arteries under tension.  Arteriotomy was made in the left common femoral artery with a 11-blade and extended it with a Potts  scissor proximally and distally extending the distal end of the arteriotomy down the profunda femoris for approximately 3 cm.   Endarterectomy was then performed under direct visualization using a freer elevator and a right angle from the mid common femoral extending up both proximally and distally. Proximally the endarterectomy was brought up to the level of the clamp where a clean edge was obtained. Distally the endarterectomy was carried down to a soft spot in the profunda femoris where a feathered edge would was obtained.  7-0 Prolene interrupted tacking sutures were placed to secure the leading edge of the plaque in the profunda femoris.  The left SFA was treated with an eversion technique extending endarterectomy approximately 2 cm distally.  At this point, we fashioned a core matrix patch for the geometry of the arteriotomy.  The patch was sewn to the artery with 2 running stitches of 6-0 Prolene, running from each end.  Prior to completing the patch angioplasty, the profunda femoral artery was flushed as was the superficial femoral artery. The system was then forward flushed. The endarterectomy site was then irrigated copiously with heparinized saline.  The suture line however was not tied on the lateral wall with the wire coming out and this would be the access for the intervention.  The C-arm was brought back into the field.  The micro sheath was then inserted over the microwire followed by a J-wire followed by a 6 [place] sheath.  A Magic torque wire and Kumpe catheter was then advanced under fluoroscopy into the aorta.  Hand-injection contrast was then used to demonstrate the anatomy of the external iliac artery.  A 8 mm x 40 mm life  star stent was then selected opened on the back table prepped and then advanced over the wire was deployed in the distal external iliac artery extending down to the level of the ilioinguinal ligament.  It was then postdilated using a 7 mm x 60 mm Lutonix drug-eluting  balloon.  Follow-up imaging by hand demonstrated excellent apposition of the stent with less than 10% residual stenosis.  At this point we decided to complete the arteriotomy closure.  The left patch angioplasty was completed in the usual fashion.  Flow was then reestablished first to the profunda femoris and then the superficial femoral artery. Any gaps or bleeding sites in the suture line were easily controlled with a 6-0 Prolene suture.   Both right and left groins were then irrigated copiously with sterile saline and subsequently Evicel and Surgicel were placed in the wound. The incision was repaired with a double layer of 2-0 Vicryl, a double layer of 3-0 Vicryl, and a layer of 4-0 Monocryl in a subcuticular fashion.  The skin was cleaned, dried, and reinforced with Dermabond.  COMPLICATIONS: None  CONDITION: Sabrina [name], M.D. [facility] Office: [phone]  03/17/2022, 12:50 PM

## 2022-03-17 NOTE — H&P (View-Only) (Signed)
MRN : [id number]  Sabrina [name] is a 81 y.o. (20-Nov-1940) female who presents with chief complaint of check circulation.  History of Present Illness:   The patient presents to [facility] for surgery.  The patient notes that there has been a significant deterioration in the lower extremity symptoms.  The patient notes interval shortening of their claudication distance and development of mild rest pain symptoms. No new ulcers or wounds have occurred since the last visit.  Recent angiogram was  diagnostic due to extensive lower extremity disease in the common femoral arteries that was not amenable to intervention.  The patient has debilitating claudication and some mild rest pain like symptoms.  There are no open wounds or ulcerations.  The previous chest pain that the patient was having is also resolved from previous subclavian stent placement.  The patient has had multiple interventions over the years.  Based on the most recent angiogram the patient should undergo bilateral femoral endarterectomies with left iliac artery stent placement.  There have been no significant changes to the patient's overall health care.  The patient denies amaurosis fugax or recent TIA symptoms. There are no recent neurological changes noted. There is no history of DVT, PE or superficial thrombophlebitis. The patient denies recent episodes of angina or shortness of breath.    Current Meds  Medication Sig   Ascorbic Acid (VITAMIN C) 500 MG CAPS Take 500 mg by mouth daily.   aspirin 81 MG tablet Take 81 mg by mouth daily.   butalbital-acetaminophen-caffeine (FIORICET) 50-325-40 MG tablet Take 1 tablet by mouth every 4 (four) hours as needed for headache.   docusate sodium (COLACE) 100 MG capsule Take 1 capsule (100 mg total) by mouth daily as needed for mild constipation or moderate constipation.   ezetimibe (ZETIA) 10 MG tablet Take 1 tablet (10 mg total) by mouth daily.   ferrous sulfate 325 (65 FE) MG EC  tablet Take 1 tablet (325 mg total) by mouth 2 (two) times daily with a meal.   hydrALAZINE (APRESOLINE) 100 MG tablet Take 1 tablet (100 mg total) by mouth 3 (three) times daily. Marland KitchenNOTE DOSE INCREASE TO 100 MG. KEEP ON FILE FOR FUTURE REFILLS   labetalol (NORMODYNE) 300 MG tablet Take 1 tablet (300 mg total) by mouth 2 (two) times daily. For hypertension   nortriptyline (PAMELOR) 10 MG capsule Take 1 capsule (10 mg total) by mouth 2 (two) times daily. For chronic headaches   Omega 3 1000 MG CAPS Take 1,000 mg by mouth 2 (two) times daily.   pantoprazole (PROTONIX) 40 MG tablet Take 1 tablet (40 mg total) by mouth daily.   ranolazine (RANEXA) 500 MG 12 hr tablet Take 1 tablet (500 mg total) by mouth 2 (two) times daily.   traMADol (ULTRAM) 50 MG tablet Take 1 tablet (50 mg total) by mouth every 6 (six) hours as needed.   valsartan (DIOVAN) 320 MG tablet Take 1 tablet (320 mg total) by mouth daily.    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) 06/18/2019   Anemia of chronic kidney failure    Anxiety    Aortic stenosis 03/18/2020   a.) TTE 03/18/2020: EF >55%; mild AS (MPG 8.7 mmHg). b.) TTE 11/16/2021: EF >55%; mild  AS (MPG 9 mmHg)   CAD (coronary artery disease)    a.) s/p 3v CABG 03/29/2000   Carotid artery stenosis    a.) s/p LEFT CEA 09/09/2003. b.) Carotid doppler 49/70/2637: 8-58% LICA, CTO RICA; subclavian stenosis   Cataracts, bilateral    Cervical spondylosis without myelopathy    Chronic diastolic CHF (congestive heart failure), NYHA class 3 (HCC)    a.) TTE 05/27/2016: EF >55%, mild LA enlargement, triv PR, mild MR, mod TR; G3DD. b.) TTE 12/12/2017: EF >55%, mild LVH, BAE, mild MR/PR, mod TR; RVSP 52.8 mmHg. c.) TTE  03/18/2020: EF >55%, BAD, AS (MPG 8.7 mmHg); triv MR, mild TR/PR. d.) TTE 11/16/2021: EF >55%, LVH, G1DD, triv MR, mild PR, mod TR; AS (MPG 9 mmHg); MS (MPG 5 mmHg)   Chronic kidney disease, stage III (moderate) (HCC)    Chronic narcotic use 06/24/2014   Chronic pain syndrome    GERD (gastroesophageal reflux disease)    History of 2019 novel coronavirus disease (COVID-19) 09/22/2021   Hyperlipidemia    Hypertension    Long term current use of antithrombotics/antiplatelets    a.) on daily DAPT therapy (ASA + clopidogrel)   Lumbar stenosis with neurogenic claudication    Mitral stenosis 11/16/2021   a.) TTE 11/16/2021: EF >55%; mod MS (MPG 5 mmHg)   Osteoarthritis of hip    Pulmonary hypertension ([place]) 12/12/2017   a.) TTE 12/12/2017: mild; RVSP 52.8 mmHg   PVD (peripheral vascular disease) (HCC)    Renal artery stenosis (HCC)    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    Past Surgical History:  Procedure Laterality Date   ABDOMINAL HYSTERECTOMY  1976   CAROTID ARTERY ANGIOPLASTY Left    CAROTID ENDARTERECTOMY Left 09/09/2003   Procedure: CAROTID ENDARTERECTOMY; Location: [facility]; Surgeon: Maura Crandall, MD   COLONOSCOPY WITH PROPOFOL N/A 08/16/2017   Procedure: COLONOSCOPY WITH PROPOFOL;  Surgeon: [name], MD;  Location: [facility];  Service: Endoscopy;  Laterality: N/A;   CORONARY ANGIOPLASTY WITH STENT PLACEMENT  2000   CORONARY ARTERY BYPASS GRAFT N/A 03/29/2000   Procedure: 3v CORONARY ARTERY BYPASS GRAFT; Location: [facility]   CYSTOSCOPY WITH STENT PLACEMENT Bilateral    ESOPHAGOGASTRODUODENOSCOPY (EGD) WITH PROPOFOL N/A 08/16/2017   Procedure: ESOPHAGOGASTRODUODENOSCOPY (EGD) WITH PROPOFOL;  Surgeon: [name], MD;  Location: [facility];  Service:  Endoscopy;  Laterality: N/A;   ESOPHAGOGASTRODUODENOSCOPY (EGD) WITH PROPOFOL N/A 06/29/2018   Procedure: ESOPHAGOGASTRODUODENOSCOPY (EGD) WITH PROPOFOL;  Surgeon: Virgel Manifold, MD;  Location: [facility];  Service: Endoscopy;  Laterality: N/A;   LAPAROSCOPIC CHOLECYSTECTOMY Left 10/26/1999   Procedure: LAPAROSCOPIC CHOLECYSTECTOMY; Location: [facility]; Surgeon: Rochel Brome, MD   LEFT HEART CATH AND CORONARY ANGIOGRAPHY N/A 11/18/2021   Procedure: LEFT HEART CATH AND CORONARY ANGIOGRAPHY;  Surgeon: Yolonda Kida, MD;  Location: [facility];  Service: Cardiovascular;  Laterality: N/A;   LEFT HEART CATH AND CORS/GRAFTS ANGIOGRAPHY Left 11/19/2002   Procedure: LEFT HEART CATH AND CORS/GRAFTS ANGIOGRAPHY; Location: [place]; Surgeon: Katrine Coho, MD   LEFT HEART CATH AND CORS/GRAFTS ANGIOGRAPHY Left 09/17/2003   Procedure: LEFT HEART CATH AND CORS/GRAFTS ANGIOGRAPHY; Location: [place]; Surgeon: Katrine Coho, MD   LOWER EXTREMITY ANGIOGRAPHY Right 01/06/2022   Procedure: Lower Extremity Angiography;  Surgeon: Katha Cabal, MD;  Location: [facility];  Service: Cardiovascular;  Laterality: Right;   RENAL ARTERY ANGIOPLASTY Bilateral 12/2013   TOE AMPUTATION Right    small toe   TONSILLECTOMY AND ADENOIDECTOMY     TOTAL HIP ARTHROPLASTY Left 2005   TOTAL HIP ARTHROPLASTY Right 2015   UPPER EXTREMITY ANGIOGRAPHY Left 11/23/2021   Procedure: UPPER EXTREMITY ANGIOGRAPHY;  Surgeon: Algernon Huxley, MD;  Location: [place] [facility];  Service: Cardiovascular;  Laterality: Left;    Social History Social History   Tobacco Use   Smoking status: Former    Types: Cigarettes    Quit date: 10/25/1998    Years since quitting: 23.4   Smokeless tobacco: Never  Vaping Use   Vaping Use: Never used  Substance Use Topics   Alcohol use: No    Alcohol/week: 0.0 standard drinks   Drug use: Never    Family History Family History  Problem Relation Age of Onset   Stroke  Mother    Hypertension Mother    Diabetes Mother    Hypertension Father    Heart disease Sister        MI   Multiple sclerosis Daughter    Multiple sclerosis Son    Cerebral aneurysm Son    Seizures Son    Cerebral aneurysm Son    Breast cancer Paternal Aunt 61    Allergies  Allergen Reactions   Citalopram Anaphylaxis    Throat closing    Dilaudid [Hydromorphone Hcl] Nausea And Vomiting   Hydrochlorothiazide Other (See Comments)    Decreased GFR (Nov 2015)   Liothyronine     Hair fell out, caused headaches    Nsaids     CKD stage III - avoid nephrotoxic drugs   Nubain [Nalbuphine Hcl]     Burning sensation in back   Penicillins Itching   Prasugrel Itching   Statins Itching     REVIEW OF SYSTEMS (Negative unless checked)  Constitutional: [] Weight loss  [] Fever  [] Chills Cardiac: [] Chest pain   [] Chest pressure   [] Palpitations   [] Shortness of breath when laying flat   [] Shortness of breath with exertion. Vascular:  [x] Pain in legs with walking   [] Pain in legs at rest  [] History of DVT   [] Phlebitis   [] Swelling in legs   [] Varicose veins   [] Non-healing ulcers Pulmonary:   [] Uses home oxygen   [] Productive cough   [] Hemoptysis   [] Wheeze  [] COPD   [] Asthma Neurologic:  [] Dizziness   [] Seizures   [] History of stroke   [] History of TIA  [] Aphasia   [] Vissual changes   [] Weakness or numbness in arm   [] Weakness or numbness in leg Musculoskeletal:   [] Joint swelling   [] Joint pain   [] Low back pain Hematologic:  [] Easy bruising  [] Easy bleeding   [] Hypercoagulable state   [] Anemic Gastrointestinal:  [] Diarrhea   [] Vomiting  [] Gastroesophageal reflux/heartburn   [] Difficulty swallowing. Genitourinary:  [] Chronic kidney disease   [] Difficult urination  [] Frequent urination   [] Blood in urine Skin:  [] Rashes   [] Ulcers  Psychological:  [] History of anxiety   []  History of major depression.  Physical Examination  Vitals:   03/17/22 0631  BP: (!) 181/79  Pulse: 67  Resp:  20  Temp: (!) 97.2 F (36.2 C)  TempSrc: Tympanic  SpO2: 99%  Weight: 71.7 kg  Height: 5\' 1"  (1.549 m)   Body mass index is 29.85 kg/m. Gen: WD/WN, NAD Head: [place]/AT, No temporalis wasting.  Ear/Nose/Throat: Hearing grossly intact, nares w/o erythema  or drainage Eyes: PER, EOMI, sclera nonicteric.  Neck: Supple, no masses.  No bruit or JVD.  Pulmonary:  Good air movement, no audible wheezing, no use of accessory muscles.  Cardiac: RRR, normal S1, S2, no Murmurs. Vascular:  mild trophic changes, no open wounds Vessel Right Left  Radial Palpable Palpable  PT Not Palpable Not Palpable  DP Not Palpable Not Palpable  Gastrointestinal: soft, non-distended. No guarding/no peritoneal signs.  Musculoskeletal: M/S 5/5 throughout.  No visible deformity.  Neurologic: CN 2-12 intact. Pain and light touch intact in extremities.  Symmetrical.  Speech is fluent. Motor exam as listed above. Psychiatric: Judgment intact, Mood & affect appropriate for pt's clinical situation. Dermatologic: No rashes or ulcers noted.  No changes consistent with cellulitis.   CBC Lab Results  Component Value Date   WBC 4.6 03/08/2022   HGB 10.7 (L) 03/08/2022   HCT 32.5 (L) 03/08/2022   MCV 88.8 03/08/2022   PLT 245 03/08/2022    BMET    Component Value Date/Time   NA 138 03/12/2022 1523   NA 139 09/13/2014 0000   NA 134 (L) 04/12/2014 0558   K 4.0 03/12/2022 1523   K 4.4 04/12/2014 0558   CL 104 03/12/2022 1523   CL 101 04/12/2014 0558   CO2 27 03/12/2022 1523   CO2 27 04/12/2014 0558   GLUCOSE 94 03/12/2022 1523   GLUCOSE 90 04/12/2014 0558   BUN 17 03/12/2022 1523   BUN 18 09/13/2014 0000   BUN 16 04/12/2014 0558   CREATININE 1.47 (H) 03/12/2022 1523   CREATININE 1.12 04/12/2014 0558   CALCIUM 9.4 03/12/2022 1523   CALCIUM 8.5 04/12/2014 0558   GFRNONAA 36 (L) 03/12/2022 1523   GFRNONAA 49 (L) 04/12/2014 0558   GFRAA 50 (L) 03/10/2020 1311   GFRAA 57 (L) 04/12/2014 0558   Estimated  Creatinine Clearance: 27.7 mL/min (A) (by C-G formula based on SCr of 1.47 mg/dL (H)).  COAG Lab Results  Component Value Date   INR 1.0 03/27/2014    Radiology MR Angiogram Head Wo Contrast  Result Date: 03/03/2022 CLINICAL DATA:  Follow-up examination for suspected ICA aneurysm. EXAM: MRA HEAD WITHOUT CONTRAST TECHNIQUE: Angiographic images of the Circle of Willis were acquired using MRA technique without intravenous contrast. COMPARISON:  Comparison made with recent brain MRI from 02/18/2022. FINDINGS: Anterior circulation: Visualized cervical left ICA widely patent with antegrade flow. Horizontal petrous left ICA widely patent as well. Atheromatous irregularity throughout the left carotid siphon without high-grade stenosis. 2.5 mm saccular outpouching arising from the cavernous left ICA consistent with a small aneurysm (series 5, image 98). This is directed medially. Additional lobulated irregular aneurysm measuring up to 3.8 mm extending posteriorly from the supraclinoid left ICA consistent with a left posterior communicating artery aneurysm (series 5, image 119). This accounts for the abnormality seen on prior brain MRI. The cervical right ICA is largely occluded with only intermittent and severely attenuated flow seen through the petrous segment. Distal reconstitution at the supraclinoid right ICA via collateral flow across the circle-of-Willis and/or the of the left posterior communicating artery. A1 segments patent. Right A1 hypoplastic. Normal anterior communicating artery complex. Both ACAs patent to their distal aspects without stenosis. Both M1 segments patent without stenosis. Normal right MCA bifurcation. There is a 2.4 mm saccular aneurysm arising from the left MCA bifurcation (series 5, image 143). This is directed medially. Distal MCA branches well perfused bilaterally. Posterior circulation: Right vertebral artery dominant and patent to the vertebrobasilar junction without stenosis.  Right PICA grossly patent at its origin. Left vertebral artery widely patent as it courses into the cranial vault. Left PICA patent. There is a focal severe stenosis involving the left V4 segment beyond the takeoff of the left PICA (series 5, image 28). Left V4 segment otherwise remains patent to the vertebrobasilar junction. Basilar patent to its distal aspect without stenosis. Anterior inferior cerebellar arteries patent. Superior cerebellar arteries patent bilaterally. Both PCA supplied via the basilar as well as bilateral posterior communicating arteries. Focal mild stenosis noted at the origin of the left P1 segment. PCAs otherwise well perfused to their distal aspects. Anatomic variants: Hypoplastic right A1 segment. Dominant right vertebral artery. Other: None. IMPRESSION: 1. 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 2. 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 3. 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 4. 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 5. Severe left V4 stenosis just beyond the takeoff of the left PICA. Dominant right vertebral artery widely patent. Electronically Signed   By: Jeannine Boga M.D.   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 Headache, chronic, new features or increased frequency. EXAM: MRI HEAD WITHOUT CONTRAST TECHNIQUE: Multiplanar, multiecho pulse sequences of the brain and surrounding structures were obtained without intravenous contrast. COMPARISON:  Head CT June 12, 2021. MRI of the brain June 02, 2012. FINDINGS: Brain: No acute infarction, hemorrhage, hydrocephalus, extra-axial collection or mass lesion. Small amount of scattered foci of T2 hyperintensity are seen within the white matter of the cerebral hemispheres, nonspecific, most likely related to chronic  small vessel ischemia. 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 Moderate parenchymal volume loss, more pronounced in the bilateral parietal are regions. Vascular: Known occlusion of the visualized upper cervical right internal carotid artery with the diminutive caliber at the distal cavernous and supraclinoid segment. A 3.7 mm outpouching from the supraclinoid left ICA may represent an aneurysm (series 10, image 11). Remainder of the major intracranial vessels have normal flow void. Skull and upper cervical spine: Normal marrow signal. Sinuses/Orbits: Bilateral lens surgery. Paranasal sinuses are essentially clear. Other: None. IMPRESSION: 1. No acute intracranial abnormality. 2. Mild chronic microvascular ischemic changes of the white matter. 3. 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 4. Moderate parenchymal volume loss with parietal predominance. 5. Suspected left ICA posterior communicating segment 3.7 mm aneurysm. Correlation with MR angiogram suggested. 6. 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 Electronically Signed   By: Pedro Earls M.D.   On: 02/18/2022 15:52   MM 3D SCREEN BREAST BILATERAL  Result Date: 02/19/2022 CLINICAL DATA:  Screening. EXAM: DIGITAL SCREENING BILATERAL MAMMOGRAM WITH TOMOSYNTHESIS AND CAD TECHNIQUE: Bilateral screening digital craniocaudal and mediolateral oblique mammograms were obtained. Bilateral screening digital breast tomosynthesis was performed. The images were evaluated with computer-aided detection. COMPARISON:  Previous exam(s). ACR Breast Density Category c: The breast tissue is heterogeneously dense, which may obscure small masses. FINDINGS: There are no findings suspicious for malignancy. IMPRESSION: No mammographic evidence of malignancy. A result letter of this screening mammogram will be mailed directly to the patient. RECOMMENDATION: Screening mammogram in one year. (Code:SM-B-01Y) BI-RADS CATEGORY  1:  Negative. Electronically Signed   By: Marin Olp M.D.   On: 02/19/2022 10:50     Assessment/Plan 1. Peripheral arterial disease with history of revascularization ([place])  Recommend:  The patient has evidence of severe atherosclerotic changes of both lower extremities associated with debilitating claudication.  t.  This represents a limb threatening ischemia and places the patient at the risk for limb loss.   Angiography has been performed and the situation is not ideal for intervention.  Given this finding open surgical repair is recommended.    Patient should undergo arterial reconstruction of the lower extremity, including bilateral femoral endarterectomy with left iliac stent placement.  The risks and benefits as well as the alternative therapies was discussed in detail with the patient.  All questions were answered.  Patient agrees to proceed with intervention.   The patient will follow up with me in the office after the procedure.      2. Essential hypertension Continue antihypertensive medications as already ordered, these medications have been reviewed and there are no changes at this time.    3. Mixed hyperlipidemia Continue statin as ordered and reviewed, no changes at this time    Hortencia Pilar, MD  03/17/2022 7:23 AM

## 2022-03-17 NOTE — Interval H&P Note (Signed)
History and Physical Interval Note:  03/17/2022 7:26 AM  Sabrina [name]  has presented today for surgery, with the diagnosis of ASO WITH CLAUDICATION.  The various methods of treatment have been discussed with the patient and family. After consideration of risks, benefits and other options for treatment, the patient has consented to  Procedure(s): ENDARTERECTOMY FEMORAL ( BILATERAL SFA STENT) (Bilateral) INSERTION OF ILIAC STENT (Left) APPLICATION OF CELL SAVER (N/A) as a surgical intervention.  The patient's history has been reviewed, patient examined, no change in status, stable for surgery.  I have reviewed the patient's chart and labs.  Questions were answered to the patient's satisfaction.     Hortencia Pilar

## 2022-03-17 NOTE — Progress Notes (Signed)
Patient was intubated in PACU by anesthesia and then transported on a trilogy vent to the ICU.

## 2022-03-17 NOTE — IPAL (Signed)
  Interdisciplinary Goals of Care Family Meeting   Date carried out: 03/17/2022  Location of the meeting: Bedside  Member's involved: Physician, Bedside Registered Nurse, and Family Member or next of kin      GOALS OF CARE DISCUSSION  The Clinical status was relayed to family in detail- Daughter [name] and Son  in Sports coach at bedside  [place]  Family understands the situation.  Plan for trial of extubation  Family are satisfied with Plan of action and management. All questions answered  Additional CC time 25 mins   Monee Dembeck Patricia Pesa, M.D.  [facility]  Medical Director [place] Director [facility] Cardio-Pulmonary Department

## 2022-03-17 NOTE — Op Note (Signed)
OPERATIVE NOTE   PROCEDURE: 1.   Left common femoral, profunda femoris, and superficial femoral artery endarterectomies 2.   Right common femoral, profunda femoris, and superficial femoral artery endarterectomies 3.  Catheter placement into aorta from left femoral approach with aortogram and consider iliofemoral venous angiogram 4.  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  significant plaque in bilateral common femoral, profunda femoris, and superficial femoral arteries  SPECIMEN(S):  Bilateral common femoral, profunda femoris, and superficial femoral artery plaque.  INDICATIONS:    Patient presents with severe PAD with disabling claudication and early rest pain.  Bilateral femoral endarterectomies are planned to try to improve perfusion.  The risks and benefits as well as alternative therapies including intervention were reviewed in detail all questions were answered the patient agrees to proceed with surgery. Cosurgeons are needed due to the complexity of the procedure and the bilateral nature of the procedure  DESCRIPTION: After obtaining full informed written consent, the patient was brought back to the operating room and placed supine upon the operating table.  The patient received IV antibiotics prior to induction.  After obtaining adequate anesthesia, the patient was prepped and draped in the standard fashion appropriate time out is called.  Cosurgeons are used due to the bilateral nature of the disease and complexity of the procedures.  With myself working on the right and Dr. Delana Meyer working on the left we began by  dissecting out the femoral arteries on each side. Vertical incisions were created overlying both femoral arteries. The common femoral artery proximally, and superficial femoral artery, and primary profunda femoris artery branches were encircled with vessel loops and prepared for control. Both femoral arteries were found to have significant plaque from the common femoral artery into the profunda and superficial femoral arteries.   6000 units of heparin was given and allowed circulate for 5 minutes.   Attention is then turned to the right femoral artery.  An arteriotomy is made with 11 blade and extended with Potts scissors in the common femoral artery and carried down onto the first 2 to 3 cm of the profunda femoris artery just above the primary bifurcation and beyond the initial branch.  An endarterectomy was then performed. The [facility] was used to create a plane. The proximal endpoint was cut flush with tenotomy scissors and a large chunk of plaque was then pulled out of the proximal common femoral artery. This was in the proximal common femoral artery. An eversion endarterectomy was then performed for the first 2-3 cm of the superficial femoral artery. The distal endpoint of the profunda femoris artery endarterectomy was created with gentle traction and the distal endpoint was tacked down with 7-0 Prolene sutures.  The Cormatrix patcth is then selected and prepared for a patch angioplasty.  It is cut and beveled and started at the proximal endpoint with a 6-0 Prolene suture.  Approximately one half of the suture line is run medially and laterally and the distal end point was cut and bevelled to match the arteriotomy.  A second 6-0 Prolene was started at the distal end point and run to the mid portion to complete the arteriotomy.  The vessel  was flushed prior to release of control and completion of the anastomosis.  At this point, flow was established first to the profunda femoris artery and then to  the superficial femoral artery. Easily palpable pulses are noted well beyond the anastomosis.  The left femoral artery is then addressed.  Initially, a micropuncture needle was used to access the proximal to mid left common femoral artery puncture wire was placed up into the iliac artery.  The needle was removed and the wire was left in place.  Arteriotomy is made in the common femoral artery and extended down into the proximal profunda femorus artery down to the primary bifurcation around this wire. Similarly, an endarterectomy was performed with the [facility]. The proximal endpoint was cut flush with tenotomy scissors in the proximal common femoral artery.  The initial portion of the superficial femoral artery was addressed and treated with an eversion endarterectomy and this was performed with a hemostat and gentle traction. The arteriotomy was carried down onto the profunda femoris artery artery down to the primary branches and the endarterectomy was continued to this point. The distal endpoint was cut with tenotomy scissors and tacked down with a series of 7-0 Prolene sutures. The Cormatrix extracellular patch was then brought onto the field.  It is cut and beveled and started at the proximal endpoint with a 6-0 Prolene suture.  Approximately one half of the suture line is run medially and laterally and the distal end point was cut and bevelled to match the arteriotomy.  A second 6-0 Prolene was started at the distal end point and run to the mid portion to complete the arteriotomy, leaving a gap for sheath placement.  An 8 French sheath was then placed up the left side over the wire and iliofemoral angiogram was performed demonstrating greater than 90% stenosis in the left external iliac artery.  The aorta and the common iliac artery are patent and the endarterectomy site in the left common femoral artery looked good.  Dr. Delana Meyer then selected an 8 mm diameter by 4 cm length life star stent in the  left external iliac artery and postdilated this with a 7 mm diameter by 6 cm length Lutonix drug-coated angioplasty balloon with less than 10% residual stenosis remaining.  The sheath was removed and the arteriotomy was completed..   Flushing maneuvers were performed and flow was reestablished to the femoral vessels. Excellent pulses noted in the most proximal superficial femoral and profunda femoris artery below the femoral anastomosis.  Fibrillar and Vistacel topical hemostatic agents were placed in the femoral incisions and hemostasis was complete. The femoral incisions were then closed in a layered fashion with 2 layers of 2-0 Vicryl, 2 layers of 3-0 Vicryl, and staples covered with an incisional VAC for the skin closure.   The patient was then awakened from anesthesia and taken to the recovery room in stable condition having tolerated the procedure well.  COMPLICATIONS: None  CONDITION: Stable     Leotis Pain 03/17/2022 12:46 PM  This note was created with Dragon Medical transcription system. Any errors in dictation are purely unintentional.

## 2022-03-17 NOTE — Progress Notes (Signed)
ICU ADMISSION NOTE  Date: 03/17/22  Patient Demographics:  Name: Sabrina [name] DOB:   Jan 02, 1941 MRN:   [id number]  Planned Surgical Procedure(s):    Case: [id number] Date/Time: 03/17/22 0715   Procedures:      ENDARTERECTOMY FEMORAL ( BILATERAL SFA STENT) (Bilateral)     INSERTION OF ILIAC STENT (Left)     APPLICATION OF CELL SAVER   Anesthesia type: General   Pre-op diagnosis: ASO WITH CLAUDICATION   Location: [place] 08 / [place] [facility] FOR ANESTHESIA GROUP   Surgeons: Katha Cabal, MD   CC ACUTE RESP FAILURE AND CARDIAC ARREST    HPI  Chamille A Zwilling is a 81 y.o. female Patient is a Former Smoker (quit 10/1998).  Pertinent PMH includes: CAD (s/p 3v CABG), CHF, pulmonary hypertension, aortic stenosis, mitral stenosis, PVD, carotid artery stenosis, abdominal aortic ectasia, HTN, HLD, secondary hyperparathyroidism, CKD-III, SOB, GERD (on daily PPI), OA, cervical spondylosis, lumbar stenosis with neurogenic claudication, chronic pain syndrome (on COT), anxiety.  Patient is followed by cardiology Clayborn Bigness, MD). At the time of her clinic visit, patient doing "reasonably well" from a cardiovascular perspective.  She denied any episodes of chest pain, increase shortness of breath, PND, orthopnea, palpitations, significant peripheral edema, vertiginous symptoms, or presyncope/syncope.  Patient with an extensive cardiovascular history.  Patient underwent a three-vessel CABG procedure on 03/29/[facility] at [facility].  LIMA-LAD, SVG-distal RCA, and SVG-ramus intermedius bypass grafts were placed.  Patient with extensive carotid artery disease.  She underwent a LEFT carotid endarterectomy on 09/09/2003.  Since that time, carotid Dopplers have been monitored.  Last carotid Doppler performed on 12/23/2021 revealed a 1-39% stenosis of the LICA with total occlusion contralaterally in the RICA.   Patient with abdominal aortic ectasia noted on ultrasound performed on  07/13/2017.  Ectasia measurements have been monitored via routine imaging.  Last ultrasound imaging of the aorta was performed on 04/07/2020, at which time ectatic area measured 2.6 cm.  Myocardial perfusion imaging study performed on 03/18/2020 revealed a normal left ventricular systolic function with a hyperdynamic LVEF of 76%.  There was no evidence of stress-induced myocardial ischemia or arrhythmia.  Study determined to be low risk.  TTE performed on 11/16/2021 revealed a normal left ventricular systolic function with mild LVH; LVEF >55%. Diastolic Doppler parameters consistent with abnormal relaxation (G1DD).  There was mild pulmonary and moderate tricuspid valve regurgitation.  There were significant transvalvular gradients noted to suggest both aortic (mean pressure gradient 9 mmHg) and mitral valve (mean pressure gradient 5 mmHg) stenosis.  Most recent cardiac catheterization was performed on 11/18/2021 revealed a normal left ventricular systolic function with an EF of 60%.  There was multivessel CAD; 95% proximal RCA-1, 95% proximal RCA-2, 100% proximal to mid RCA, 75% ostial LM to ostial LAD, 50% ostial LAD, 50% ostial to proximal LAD, 95% mid LAD, 80% proximal LCx, 90% mid LCx, 80% distal LCx with 80% stenosis of the side branch of OM 3.  Bypass grafts occluded; 100% occlusion of the SVG-RCA graft, 50% proximal SVG-OM, and 90% stenosis of the distal SVG-OM. There were tandem severe 90% and 75% stenoses of the subclavian.  Unable to evaluate LIMA-LAD graft secondary to severe subclavian disease.  Patient being referred to vascular for treatment of subclavian.  Will consider reevaluating LIMA-LAD graft once subclavian has been treated.  Concerns for LEFT subclavian steal syndrome.  Patient underwent 8.0 x 38 mm Lifestream stent placement to the LEFT subclavian on 11/23/2021.  PATIENT ASSESSED FOR  BILATERAL FEMORAL ENDARTERECTOMY FEMORAL AND SFA STENTING  with Dr. Hortencia Pilar, MD.     SURGERY LASTED FOR 4 HRS LOST 438ml BLOOD  POST OP CARE-patient was extubated and was talking and taking sips of water when she had vagal episode with severe bradycardia then acute cardiac arrest with CPR lasting for 2 mins, patient received EPI, patient was emergently intubated by nurse Anesthetist, POST NTUBATION, Patients BP was 320/180`   SIGNIFICANT EVENTS 5/24 s/p B/L femoral endarterectomy and stenting 5/24 acute cardiac arrest, placed on MV support  BP 131/74 (BP Location: Right Arm)   Pulse 85   Temp (!) 96.9 F (36.1 C) (Axillary)   Resp 16   Ht $R'5\' 1"'dh$  (1.549 m)   Wt 73.3 kg   SpO2 100%   BMI 30.53 kg/m      REVIEW OF SYSTEMS  PATIENT IS UNABLE TO PROVIDE COMPLETE REVIEW OF SYSTEMS DUE TO SEVERE CRITICAL ILLNESS AND TOXIC METABOLIC ENCEPHALOPATHY    PHYSICAL EXAMINATION:  GENERAL:critically ill appearing, +resp distress EYES: Pupils equal, round, reactive to light.  No scleral icterus.  MOUTH: Moist mucosal membrane. INTUBATED NECK: Supple.  PULMONARY: +rhonchi, +wheezing CARDIOVASCULAR: S1 and S2.  No murmurs  GASTROINTESTINAL: Soft, nontender, -distended. Positive bowel sounds.  MUSCULOSKELETAL: No swelling, clubbing, or edema.  NEUROLOGIC: obtunded SKIN:intact,warm,dry      Allergies:  Citalopram, Dilaudid [hydromorphone hcl], Hydrochlorothiazide, Liothyronine, Nsaids, Nubain [nalbuphine hcl], Penicillins, Prasugrel, and Statins  Current Home Medications:    0.9 %  sodium chloride infusion   0.9 %  sodium chloride infusion   acetaminophen (TYLENOL) tablet 325-650 mg   Or   acetaminophen (TYLENOL) suppository 325-650 mg   alum & mag hydroxide-simeth (MAALOX/MYLANTA) 200-200-20 MG/5ML suspension 15-30 mL   aspirin tablet 81 mg   atropine 1 MG/10ML injection   clopidogrel (PLAVIX) tablet 75 mg   docusate sodium (COLACE) capsule 100 mg   [START ON 03/18/2022] docusate sodium (COLACE) capsule 100 mg   DOPamine (INTROPIN) 800 mg in dextrose 5 %  250 mL (3.2 mg/mL) infusion   [START ON 03/18/2022] enoxaparin (LOVENOX) injection 40 mg   etomidate (AMIDATE) 2 MG/ML injection   ezetimibe (ZETIA) tablet 10 mg   famotidine (PEPCID) IVPB 20 mg premix   fentaNYL (SUBLIMAZE) injection 12.5 mcg   ferrous sulfate EC tablet 325 mg   guaiFENesin-dextromethorphan (ROBITUSSIN DM) 100-10 MG/5ML syrup 15 mL   hydrALAZINE (APRESOLINE) injection 5 mg   hydrALAZINE (APRESOLINE) tablet 100 mg   irbesartan (AVAPRO) tablet 300 mg   labetalol (NORMODYNE) injection 10 mg   labetalol (NORMODYNE) tablet 300 mg   magnesium sulfate IVPB 2 g 50 mL   metoprolol tartrate (LOPRESSOR) injection 2-5 mg   morphine (PF) 2 MG/ML injection 2-5 mg   nitroGLYCERIN 50 mg in dextrose 5 % 250 mL (0.2 mg/mL) infusion   nortriptyline (PAMELOR) capsule 10 mg   Omega 3 CAPS 1,000 mg   ondansetron (ZOFRAN) injection 4 mg   ondansetron (ZOFRAN) injection 4 mg   oxyCODONE-acetaminophen (PERCOCET/ROXICET) 5-325 MG per tablet 1-2 tablet   pantoprazole (PROTONIX) EC tablet 40 mg   phenol (CHLORASEPTIC) mouth spray 1 spray   potassium chloride SA (KLOR-CON M) CR tablet 20-40 mEq   ranolazine (RANEXA) 12 hr tablet 500 mg   senna-docusate (Senokot-S) tablet 1 tablet   sorbitol 70 % solution 30 mL   succinylcholine (ANECTINE) 200 MG/10ML syringe   vancomycin (VANCOCIN) IVPB 1000 mg/200 mL premix   Vitamin C CAPS 500 mg   History:   Past Medical  History:  Diagnosis Date   Abdominal aortic ectasia ([place]) 07/13/2017   a.) Surveillance measurements: 2.6 cm ([place] 07/13/2017), 2.9 cm ([place] 09/04/2017), 2.9 cm ([place] 09/14/2018), 2.9 cm ([place] 10/03/2019), 2.6 cm ([place] 04/07/2020)   Amputation of fifth toe, right, traumatic, subsequent encounter ([place]) 06/18/2019   Anemia of chronic kidney failure    Anxiety    Aortic stenosis 03/18/2020   a.) TTE 03/18/2020: EF >55%; mild AS (MPG 8.7 mmHg). b.) TTE 11/16/2021: EF >55%; mild AS (MPG 9 mmHg)   CAD (coronary artery disease)    a.) s/p 3v CABG  03/29/2000   Carotid artery stenosis    a.) s/p LEFT CEA 09/09/2003. b.) Carotid doppler 56/21/3086: 5-78% LICA, CTO RICA; subclavian stenosis   Cataracts, bilateral    Cervical spondylosis without myelopathy    Chronic diastolic CHF (congestive heart failure), NYHA class 3 (HCC)    a.) TTE 05/27/2016: EF >55%, mild LA enlargement, triv PR, mild MR, mod TR; G3DD. b.) TTE 12/12/2017: EF >55%, mild LVH, BAE, mild MR/PR, mod TR; RVSP 52.8 mmHg. c.) TTE 03/18/2020: EF >55%, BAD, AS (MPG 8.7 mmHg); triv MR, mild TR/PR. d.) TTE 11/16/2021: EF >55%, LVH, G1DD, triv MR, mild PR, mod TR; AS (MPG 9 mmHg); MS (MPG 5 mmHg)   Chronic kidney disease, stage III (moderate) (HCC)    Chronic narcotic use 06/24/2014   Chronic pain syndrome    GERD (gastroesophageal reflux disease)    History of 2019 novel coronavirus disease (COVID-19) 09/22/2021   Hyperlipidemia    Hypertension    Long term current use of antithrombotics/antiplatelets    a.) on daily DAPT therapy (ASA + clopidogrel)   Lumbar stenosis with neurogenic claudication    Mitral stenosis 11/16/2021   a.) TTE 11/16/2021: EF >55%; mod MS (MPG 5 mmHg)   Osteoarthritis of hip    Pulmonary hypertension ([place]) 12/12/2017   a.) TTE 12/12/2017: mild; RVSP 52.8 mmHg   PVD (peripheral vascular disease) (HCC)    Renal artery stenosis (HCC)    S/P CABG x 3 03/29/2000   a.) 3v CABG: LIMA-LAD, SVG-dRCA, SVG-RI   Secondary hyperparathyroidism (HCC)    SOB (shortness of breath)    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   Past Surgical History:  Procedure Laterality Date   ABDOMINAL HYSTERECTOMY  1976   CAROTID ARTERY ANGIOPLASTY Left    CAROTID ENDARTERECTOMY Left 09/09/2003   Procedure: CAROTID ENDARTERECTOMY; Location: [facility]; Surgeon: Maura Crandall, MD   COLONOSCOPY WITH PROPOFOL N/A 08/16/2017   Procedure: COLONOSCOPY WITH PROPOFOL;  Surgeon: [name], MD;  Location:  [facility] ENDOSCOPY;  Service: Endoscopy;  Laterality: N/A;   CORONARY ANGIOPLASTY WITH STENT PLACEMENT  2000   CORONARY ARTERY BYPASS GRAFT N/A 03/29/2000   Procedure: 3v CORONARY ARTERY BYPASS GRAFT; Location: [facility]   CYSTOSCOPY WITH STENT PLACEMENT Bilateral    ESOPHAGOGASTRODUODENOSCOPY (EGD) WITH PROPOFOL N/A 08/16/2017   Procedure: ESOPHAGOGASTRODUODENOSCOPY (EGD) WITH PROPOFOL;  Surgeon: [name], MD;  Location: [facility] ENDOSCOPY;  Service: Endoscopy;  Laterality: N/A;   ESOPHAGOGASTRODUODENOSCOPY (EGD) WITH PROPOFOL N/A 06/29/2018   Procedure: ESOPHAGOGASTRODUODENOSCOPY (EGD) WITH PROPOFOL;  Surgeon: Virgel Manifold, MD;  Location: [facility] ENDOSCOPY;  Service: Endoscopy;  Laterality: N/A;   LAPAROSCOPIC CHOLECYSTECTOMY Left 10/26/1999   Procedure: LAPAROSCOPIC CHOLECYSTECTOMY; Location: [facility]; Surgeon: Rochel Brome, MD   LEFT HEART CATH AND CORONARY ANGIOGRAPHY N/A 11/18/2021   Procedure: LEFT HEART CATH AND CORONARY ANGIOGRAPHY;  Surgeon: Yolonda Kida,  MD;  Location: [facility] INVASIVE CV LAB;  Service: Cardiovascular;  Laterality: N/A;   LEFT HEART CATH AND CORS/GRAFTS ANGIOGRAPHY Left 11/19/2002   Procedure: LEFT HEART CATH AND CORS/GRAFTS ANGIOGRAPHY; Location: [facility]; Surgeon: Rudean Hitt, MD   LEFT HEART CATH AND CORS/GRAFTS ANGIOGRAPHY Left 09/17/2003   Procedure: LEFT HEART CATH AND CORS/GRAFTS ANGIOGRAPHY; Location: [facility]; Surgeon: Rudean Hitt, MD   LOWER EXTREMITY ANGIOGRAPHY Right 01/06/2022   Procedure: Lower Extremity Angiography;  Surgeon: Renford Dills, MD;  [facility] INVAS[facility] CV LAB;  Service: Cardiovascular;  Laterality: Right;   RENAL ARTERY ANGIOPLASTY Bilateral 12/2013   TOE AMPUTATION Right    small toe   TONSILLECTOMY AND ADENOIDECTOMY     TOTAL HIP ARTHROPLASTY Left 2005   TOTAL HIP ARTHROPLASTY Right 2015   UPPER EXTREMITY ANGIOGRAPHY Left 11/23/2021   Procedure: UPPER EXTREMITY ANGIOGRAPHY;  Surgeon: Annice Needy, MD;  Location: [facility] INVASIVE CV  LAB;  Service: Cardiovascular;  Laterality: Left;   Family History  Problem Relation Age of Onset   Stroke Mother    Hypertension Mother    Diabetes Mother    Hypertension Father    Heart disease Sister        MI   Multiple sclerosis Daughter    Multiple sclerosis Son    Cerebral aneurysm Son    Seizures Son    Cerebral aneurysm Son    Breast cancer Paternal Aunt 70   Social History   Tobacco Use   Smoking status: Former    Types: Cigarettes    Quit date: 10/25/1998    Years since quitting: 23.4   Smokeless tobacco: Never  Vaping Use   Vaping Use: Never used  Substance Use Topics   Alcohol use: No    Alcohol/week: 0.0 standard drinks   Drug use: Never    Pertinent Clinical Results:  LABS: Labs reviewed: Acceptable for surgery.  Lab Results  Component Value Date   WBC 8.7 03/17/2022   HGB 9.1 (L) 03/17/2022   HCT 27.9 (L) 03/17/2022   MCV 87.5 03/17/2022   PLT 213 03/17/2022   Component Date Value Ref Range Status   ABO/RH(D) 03/12/2022 B POS   Final   Antibody Screen 03/12/2022 NEG   Final   Sample Expiration 03/12/2022 03/26/2022,2359   Final   Extend sample reason 03/12/2022    Final                   Value:NO TRANSFUSIONS OR PREGNANCY IN THE PAST 3 MONTHS Performed at [facility], [address] [place], [place] [address]    Sodium 03/12/2022 138  135 - 145 mmol/L Final   Potassium 03/12/2022 4.0  3.5 - 5.1 mmol/L Final   Chloride 03/12/2022 104  98 - 111 mmol/L Final   CO2 03/12/2022 27  22 - 32 mmol/L Final   Glucose, Bld 03/12/2022 94  70 - 99 mg/dL Final   Glucose reference range applies only to samples taken after fasting for at least 8 hours.   BUN 03/12/2022 17  8 - 23 mg/dL Final   Creatinine, Ser 03/12/2022 1.47 (H)  0.44 - 1.00 mg/dL Final   Calcium 93/09/4155 9.4  8.9 - 10.3 mg/dL Final   GFR, Estimated 03/12/2022 36 (L)  >60 mL/min Final   Comment: (NOTE) Calculated using the CKD-EPI Creatinine Equation (2021)    Anion gap  03/12/2022 7  5 - 15 Final   Performed at [facility], [address] [place], [place] [address]  MRSA, PCR 03/12/2022 NEGATIVE  NEGATIVE Final   Staphylococcus aureus 03/12/2022 NEGATIVE  NEGATIVE Final   Comment: (NOTE) The Xpert SA Assay (FDA approved for NASAL specimens in patients 33 years of age and older), is one component of a comprehensive surveillance program. It is not intended to diagnose infection nor to guide or monitor treatment. Performed at [facility], [address] [place], [place] [address]     ASSESSMENT AND PLAN SYNOPSIS  81 yo AAF with significant CAD with extensive PAD admitted to ICU s/p VASC surgery with acute cardiac arrest leading to severe hypoxic resp failure causes include ACUTE NSTEMI, ACUTE HEART FAILURE, ACUTE CVA    Severe ACUTE Hypoxic and Hypercapnic Respiratory Failure -continue Mechanical Ventilator support -continue Bronchodilator Therapy -Wean Fio2 and PEEP as tolerated -VAP/VENT bundle implementation -will NOT perform SAT/SBT when respiratory parameters are met  FiO2 (%):  [40 %] 40 %    CARDIAC FAILURE-NSTEMI/CARDIAC ARREST -follow up cardiac enzymes as indicated Check ECHO VENT SUPPORT  CARDIAC ICU monitoring  ACUTE KIDNEY INJURY/Renal Failure -continue Foley Catheter-assess need -Avoid nephrotoxic agents -Follow urine output, BMP -Ensure adequate renal perfusion, optimize oxygenation -Renal dose medications  Intake/Output Summary (Last 24 hours) at 03/17/2022 1436 Last data filed at 03/17/2022 1420 Gross per 24 hour  Intake 2250 ml  Output 850 ml  Net 1400 ml    NEUROLOGY Acute toxic metabolic encephalopathy, need for sedation Will obtain CT/MRI if possible  ENDO - ICU hypoglycemic\Hyperglycemia protocol -check FSBS per protocol   GI GI PROPHYLAXIS as indicated  NUTRITIONAL STATUS DIET-->TF on hold Constipation protocol as indicated   ELECTROLYTES -follow labs as  needed -replace as needed -pharmacy consultation and following  ALL CODE BLUE LABS PENDING  DVT/GI PRX  assessed I Assessed the need for Labs I Assessed the need for Foley I Assessed the need for Central Venous Line Family Discussion when available I Assessed the need for Mobilization I made an Assessment of medications to be adjusted accordingly Safety Risk assessment completed  CASE DISCUSSED IN MULTIDISCIPLINARY ROUNDS WITH ICU TEAM   Critical Care Time devoted to patient care services described in this note is 75 minutes.  Critical care was necessary to treat /prevent imminent and life-threatening deterioration. Overall, patient is critically ill, prognosis is guarded.  Patient with Multiorgan failure and at high risk for cardiac arrest and death.    Corrin Parker, M.D.  [facility]  Medical Director [place] Director [facility] Cardio-Pulmonary Department

## 2022-03-17 NOTE — Progress Notes (Signed)
Patient has multiple yellow rings on all her fingers. Patient states she is unable to get them off. She also has 2 bracelets on her right wrist that she is unable to get off. These will be taped prior to surgery. OR nurse aware. Surgeon aware.   Also patient does not tolerated IV's and needle sticks well. For patient comfort we will let anesthesia get the 2nd IV needed. OR RN aware.

## 2022-03-17 NOTE — Progress Notes (Addendum)
Code called on patient after pulse rate dropped. Being bagged and IV atropine and epi given.  Compressions done for 2 minutes to circulate the medicines. EKG done and labs drawn via A-line. Transported to ICU 20 at 1420 with vent. See code record.

## 2022-03-17 NOTE — Procedures (Signed)
Extubation Procedure Note  Patient Details:   Name: Sabrina [name] DOB: [birth year] MRN: [id number]   Airway Documentation:    Vent end date: 03/17/22 Vent end time: 1550   Evaluation  O2 sats: stable throughout Complications: No apparent complications Patient did tolerate procedure well. Bilateral Breath Sounds: Clear, Diminished   Yes.  Extubated to room air without complication.  Rayne Du Devlin Mcveigh 03/17/2022, 3:53 PM

## 2022-03-17 NOTE — Anesthesia Procedure Notes (Signed)
Procedure Name: Intubation Date/Time: 03/17/2022 7:50 AM Performed by: Loletha Grayer, CRNA Pre-anesthesia Checklist: Patient identified, Patient being monitored, Timeout performed, Emergency Drugs available and Suction available Patient Re-evaluated:Patient Re-evaluated prior to induction Oxygen Delivery Method: Circle system utilized Preoxygenation: Pre-oxygenation with 100% oxygen Induction Type: IV induction Ventilation: Mask ventilation without difficulty Laryngoscope Size: Miller and 2 Grade View: Grade I Tube type: Oral Tube size: 7.0 mm Number of attempts: 1 Airway Equipment and Method: Stylet Placement Confirmation: ETT inserted through vocal cords under direct vision, positive ETCO2 and breath sounds checked- equal and bilateral Secured at: 21 cm Tube secured with: Tape Dental Injury: Teeth and Oropharynx as per pre-operative assessment

## 2022-03-17 NOTE — Anesthesia Procedure Notes (Signed)
Arterial Line Insertion Start/End5/24/2023 7:50 AM, 03/17/2022 8:20 AM Performed by: Andria Frames, MD, anesthesiologist  Patient location: OR. Preanesthetic checklist: patient identified, IV checked, site marked, risks and benefits discussed, surgical consent, monitors and equipment checked, pre-op evaluation, timeout performed and anesthesia consent radial was placed Catheter size: 20 G Hand hygiene performed  and maximum sterile barriers used   Attempts: 5 or more Procedure performed using ultrasound guided technique. Ultrasound Notes:anatomy identified, needle tip was noted to be adjacent to the nerve/plexus identified and no ultrasound evidence of intravascular and/or intraneural injection Following insertion, dressing applied. Post procedure assessment: normal and unchanged  Patient tolerated the procedure well with no immediate complications. Additional procedure comments: Placed under [place]. Attempt x 2 on R radial via SRNA under [place]. No success. Attempt on L R side x1 by SRNA, no success. Attempt x2 by Dr. Amie Critchley on L radial side x2 under [place]. Success on 2nd attempt.Marland Kitchen

## 2022-03-17 NOTE — Progress Notes (Signed)
Attempted to perform bedside swallow eval at this time.  Patient and daughter refusing for patient to take water.  Patient stated that her throat hurt and she didn't want it and also stated that water makes her cough.  Asked if all liquids make her cough or just water.  She stated it was just water and that she doesn't take thickened liquids at home.

## 2022-03-17 NOTE — Anesthesia Preprocedure Evaluation (Addendum)
Anesthesia Evaluation  Patient identified by MRN, date of birth, ID band Patient awake    Reviewed: Allergy & Precautions, NPO status , Patient's Chart, lab work & pertinent test results  History of Anesthesia Complications Negative for: history of anesthetic complications  Airway Mallampati: III  TM Distance: >3 FB Neck ROM: full    Dental  (+) Upper Dentures, Lower Dentures   Pulmonary neg pulmonary ROS, neg shortness of breath, former smoker,    Pulmonary exam normal        Cardiovascular hypertension, pulmonary hypertension(-) angina+ CAD, + Peripheral Vascular Disease and +CHF  Normal cardiovascular exam+ Valvular Problems/Murmurs      Neuro/Psych  Headaches, PSYCHIATRIC DISORDERS    GI/Hepatic Neg liver ROS, GERD  Controlled,  Endo/Other  Hypothyroidism   Renal/GU Renal disease     Musculoskeletal   Abdominal   Peds  Hematology negative hematology ROS (+)   Anesthesia Other Findings Past Medical History: 07/13/2017: Abdominal aortic ectasia (HCC)     Comment:  a.) Surveillance measurements: 2.6 cm ([place] 07/13/2017),               2.9 cm ([facility] 09/04/2017), 2.9 cm ([place] 09/14/2018), 2.9 cm               ([place] 10/03/2019), 2.6 cm ([place] 04/07/2020) 06/18/2019: Amputation of fifth toe, right, traumatic, subsequent  encounter ([place]) No date: Anemia of chronic kidney failure No date: Anxiety 03/18/2020: Aortic stenosis     Comment:  a.) TTE 03/18/2020: EF >55%; mild AS (MPG 8.7 mmHg). b.)              TTE 11/16/2021: EF >55%; mild AS (MPG 9 mmHg) No date: CAD (coronary artery disease)     Comment:  a.) s/p 3v CABG 03/29/2000 No date: Carotid artery stenosis     Comment:  a.) s/p LEFT CEA 09/09/2003. b.) Carotid doppler               15/40/0867: 6-19% LICA, CTO RICA; subclavian stenosis No date: Cataracts, bilateral No date: Cervical spondylosis without myelopathy No date: Chronic diastolic CHF (congestive heart  failure), NYHA class  3 (HCC)     Comment:  a.) TTE 05/27/2016: EF >55%, mild LA enlargement, triv               PR, mild MR, mod TR; G3DD. b.) TTE 12/12/2017: EF >55%,               mild LVH, BAE, mild MR/PR, mod TR; RVSP 52.8 mmHg. c.)               TTE 03/18/2020: EF >55%, BAD, AS (MPG 8.7 mmHg); triv MR,              mild TR/PR. d.) TTE 11/16/2021: EF >55%, LVH, G1DD, triv               MR, mild PR, mod TR; AS (MPG 9 mmHg); MS (MPG 5 mmHg) No date: Chronic kidney disease, stage III (moderate) ([place]) 06/24/2014: Chronic narcotic use No date: Chronic pain syndrome No date: GERD (gastroesophageal reflux disease) 09/22/2021: History of 2019 novel coronavirus disease (COVID-19) No date: Hyperlipidemia No date: Hypertension No date: Long term current use of antithrombotics/antiplatelets     Comment:  a.) on daily DAPT therapy (ASA + clopidogrel) No date: Lumbar stenosis with neurogenic claudication 11/16/2021: Mitral stenosis     Comment:  a.) TTE 11/16/2021: EF >55%; mod MS (MPG 5 mmHg) No date: Osteoarthritis of hip 12/12/2017:  Pulmonary hypertension ([place])     Comment:  a.) TTE 12/12/2017: mild; RVSP 52.8 mmHg No date: PVD (peripheral vascular disease) (HCC) No date: Renal artery stenosis ([place]) 03/29/2000: S/P CABG x 3     Comment:  a.) 3v CABG: LIMA-LAD, SVG-dRCA, SVG-RI No date: Secondary hyperparathyroidism ([place]) No date: SOB (shortness of breath) No date: 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  Past Surgical History: 1976: ABDOMINAL HYSTERECTOMY No date: CAROTID ARTERY ANGIOPLASTY; Left 09/09/2003: CAROTID ENDARTERECTOMY; Left     Comment:  Procedure: CAROTID ENDARTERECTOMY; Location: [facility];               Surgeon: Maura Crandall, MD 08/16/2017: COLONOSCOPY WITH PROPOFOL; N/A     Comment:  Procedure: COLONOSCOPY WITH PROPOFOL;  Surgeon: [name], MD;   Location: [facility] ENDOSCOPY;  Service:               Endoscopy;  Laterality: N/A; 2000: CORONARY ANGIOPLASTY WITH STENT PLACEMENT 03/29/2000: CORONARY ARTERY BYPASS GRAFT; N/A     Comment:  Procedure: 3v CORONARY ARTERY BYPASS GRAFT; Location:               [facility] No date: CYSTOSCOPY WITH STENT PLACEMENT; Bilateral 08/16/2017: ESOPHAGOGASTRODUODENOSCOPY (EGD) WITH PROPOFOL; N/A     Comment:  Procedure: ESOPHAGOGASTRODUODENOSCOPY (EGD) WITH               PROPOFOL;  Surgeon: [name], MD;  Location: [facility]               ENDOSCOPY;  Service: Endoscopy;  Laterality: N/A; 06/29/2018: ESOPHAGOGASTRODUODENOSCOPY (EGD) WITH PROPOFOL; N/A     Comment:  Procedure: ESOPHAGOGASTRODUODENOSCOPY (EGD) WITH               PROPOFOL;  Surgeon: Virgel Manifold, MD;  Location:               [facility] ENDOSCOPY;  Service: Endoscopy;  Laterality: N/A; 10/26/1999: LAPAROSCOPIC CHOLECYSTECTOMY; Left     Comment:  Procedure: LAPAROSCOPIC CHOLECYSTECTOMY; Location: [facility];              Surgeon: Rochel Brome, MD 11/18/2021: LEFT HEART CATH AND CORONARY ANGIOGRAPHY; N/A     Comment:  Procedure: LEFT HEART CATH AND CORONARY ANGIOGRAPHY;                Surgeon: Yolonda Kida, MD;  Location: [facility]              [facility];  Service: Cardiovascular;  Laterality: N/A; 11/19/2002: LEFT HEART CATH AND CORS/GRAFTS ANGIOGRAPHY; Left     Comment:  Procedure: LEFT HEART CATH AND CORS/GRAFTS ANGIOGRAPHY;               Location: [place]; Surgeon: Katrine Coho, MD 09/17/2003: LEFT HEART CATH AND CORS/GRAFTS ANGIOGRAPHY; Left     Comment:  Procedure: LEFT HEART CATH AND CORS/GRAFTS ANGIOGRAPHY;               Location: [place]; Surgeon: Katrine Coho, MD 01/06/2022: LOWER EXTREMITY ANGIOGRAPHY; Right     Comment:  Procedure: Lower Extremity Angiography;  Surgeon:               Katha Cabal, MD;  Location: [place] [facility];  Service: Cardiovascular;  Laterality: Right; 12/2013: RENAL ARTERY ANGIOPLASTY;  Bilateral No date: TOE AMPUTATION; Right     Comment:  small toe No date: TONSILLECTOMY AND ADENOIDECTOMY 2005: TOTAL HIP ARTHROPLASTY; Left 2015: TOTAL HIP ARTHROPLASTY; Right 11/23/2021: UPPER EXTREMITY ANGIOGRAPHY; Left     Comment:  Procedure: UPPER EXTREMITY ANGIOGRAPHY;  Surgeon: Algernon Huxley, MD;  Location: [place] [facility];  Service:               Cardiovascular;  Laterality: Left;  BMI    Body Mass Index: 29.85 kg/m      Reproductive/Obstetrics negative OB ROS                             Anesthesia Physical Anesthesia Plan  ASA: 4  Anesthesia Plan: General ETT   Post-op Pain Management:    Induction: Intravenous  PONV Risk Score and Plan: Ondansetron, Dexamethasone, Midazolam and Treatment may vary due to age or medical condition  Airway Management Planned: Oral ETT  Additional Equipment: Arterial line  Intra-op Plan:   Post-operative Plan: Extubation in OR  Informed Consent: I have reviewed the patients History and Physical, chart, labs and discussed the procedure including the risks, benefits and alternatives for the proposed anesthesia with the patient or authorized representative who has indicated his/her understanding and acceptance.     Dental Advisory Given  Plan Discussed with: Anesthesiologist, CRNA and Surgeon  Anesthesia Plan Comments: (Patient and family informed that patient is higher risk for complications from anesthesia during this procedure due to their medical history.  They voiced understanding.   Patient consented for risks of anesthesia including but not limited to:  - adverse reactions to medications - damage to eyes, teeth, lips or other oral mucosa - nerve damage due to positioning  - sore throat or hoarseness - Damage to heart, brain, nerves, lungs, other parts of body or loss of life  Patient voiced understanding.)       Anesthesia Quick Evaluation

## 2022-03-17 NOTE — Progress Notes (Signed)
MRN : [id number]  Sabrina [name] is a 81 y.o. ([birth year]) female who presents with chief complaint of check circulation.  History of Present Illness:   The patient presents to [facility] for surgery.  The patient notes that there has been a significant deterioration in the lower extremity symptoms.  The patient notes interval shortening of their claudication distance and development of mild rest pain symptoms. No new ulcers or wounds have occurred since the last visit.  Recent angiogram was  diagnostic due to extensive lower extremity disease in the common femoral arteries that was not amenable to intervention.  The patient has debilitating claudication and some mild rest pain like symptoms.  There are no open wounds or ulcerations.  The previous chest pain that the patient was having is also resolved from previous subclavian stent placement.  The patient has had multiple interventions over the years.  Based on the most recent angiogram the patient should undergo bilateral femoral endarterectomies with left iliac artery stent placement.  There have been no significant changes to the patient's overall health care.  The patient denies amaurosis fugax or recent TIA symptoms. There are no recent neurological changes noted. There is no history of DVT, PE or superficial thrombophlebitis. The patient denies recent episodes of angina or shortness of breath.    Current Meds  Medication Sig   Ascorbic Acid (VITAMIN C) 500 MG CAPS Take 500 mg by mouth daily.   aspirin 81 MG tablet Take 81 mg by mouth daily.   butalbital-acetaminophen-caffeine (FIORICET) 50-325-40 MG tablet Take 1 tablet by mouth every 4 (four) hours as needed for headache.   docusate sodium (COLACE) 100 MG capsule Take 1 capsule (100 mg total) by mouth daily as needed for mild constipation or moderate constipation.   ezetimibe (ZETIA) 10 MG tablet Take 1 tablet (10 mg total) by mouth daily.   ferrous sulfate 325 (65 FE) MG EC  tablet Take 1 tablet (325 mg total) by mouth 2 (two) times daily with a meal.   hydrALAZINE (APRESOLINE) 100 MG tablet Take 1 tablet (100 mg total) by mouth 3 (three) times daily. Marland KitchenNOTE DOSE INCREASE TO 100 MG. KEEP ON FILE FOR FUTURE REFILLS   labetalol (NORMODYNE) 300 MG tablet Take 1 tablet (300 mg total) by mouth 2 (two) times daily. For hypertension   nortriptyline (PAMELOR) 10 MG capsule Take 1 capsule (10 mg total) by mouth 2 (two) times daily. For chronic headaches   Omega 3 1000 MG CAPS Take 1,000 mg by mouth 2 (two) times daily.   pantoprazole (PROTONIX) 40 MG tablet Take 1 tablet (40 mg total) by mouth daily.   ranolazine (RANEXA) 500 MG 12 hr tablet Take 1 tablet (500 mg total) by mouth 2 (two) times daily.   traMADol (ULTRAM) 50 MG tablet Take 1 tablet (50 mg total) by mouth every 6 (six) hours as needed.   valsartan (DIOVAN) 320 MG tablet Take 1 tablet (320 mg total) by mouth daily.    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 b.) TTE 11/16/2021: EF >55%; mild  AS (MPG 9 mmHg)   CAD (coronary artery disease)    a.) s/p 3v CABG 03/29/2000   Carotid artery stenosis    a.) s/p LEFT CEA 09/09/2003. b.) Carotid doppler 70/62/3762: 8-31% LICA, CTO RICA; subclavian stenosis   Cataracts, bilateral    Cervical spondylosis without myelopathy    Chronic diastolic CHF (congestive heart failure), NYHA class 3 (HCC)    a.) TTE 05/27/2016: EF >55%, mild LA enlargement, triv PR, mild MR, mod TR; G3DD. b.) TTE 12/12/2017: EF >55%, mild LVH, BAE, mild MR/PR, mod TR; RVSP 52.8 mmHg. c.) TTE  03/18/2020: EF >55%, BAD, AS (MPG 8.7 mmHg); triv MR, mild TR/PR. d.) TTE 11/16/2021: EF >55%, LVH, G1DD, triv MR, mild PR, mod TR; AS (MPG 9 mmHg); MS (MPG 5 mmHg)   Chronic kidney disease, stage III (moderate) (HCC)    Chronic narcotic use 06/24/2014   Chronic pain syndrome    GERD (gastroesophageal reflux disease)    History of 2019 novel coronavirus disease (COVID-19) 09/22/2021   Hyperlipidemia    Hypertension    Long term current use of antithrombotics/antiplatelets    a.) on daily DAPT therapy (ASA + clopidogrel)   Lumbar stenosis with neurogenic claudication    Mitral stenosis 11/16/2021   a.) TTE 11/16/2021: EF >55%; mod MS (MPG 5 mmHg)   Osteoarthritis of hip    Pulmonary hypertension ([place]) 12/12/2017   a.) TTE 12/12/2017: mild; RVSP 52.8 mmHg   PVD (peripheral vascular disease) (HCC)    Renal artery stenosis (HCC)    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    Past Surgical History:  Procedure Laterality Date   ABDOMINAL HYSTERECTOMY  1976   CAROTID ARTERY ANGIOPLASTY Left    CAROTID ENDARTERECTOMY Left 09/09/2003   Procedure: CAROTID ENDARTERECTOMY; Location: [facility]; Surgeon: Maura Crandall, MD   COLONOSCOPY WITH PROPOFOL N/A 08/16/2017   Procedure: COLONOSCOPY WITH PROPOFOL;  Surgeon: [name], MD;  Location: [facility];  Service: Endoscopy;  Laterality: N/A;   CORONARY ANGIOPLASTY WITH STENT PLACEMENT  2000   CORONARY ARTERY BYPASS GRAFT N/A 03/29/2000   Procedure: 3v CORONARY ARTERY BYPASS GRAFT; Location: [facility]   CYSTOSCOPY WITH STENT PLACEMENT Bilateral    ESOPHAGOGASTRODUODENOSCOPY (EGD) WITH PROPOFOL N/A 08/16/2017   Procedure: ESOPHAGOGASTRODUODENOSCOPY (EGD) WITH PROPOFOL;  Surgeon: [name], MD;  Location: [facility] END[facility];  Service:  Endoscopy;  Laterality: N/A;   ESOPHAGOGASTRODUODENOSCOPY (EGD) WITH PROPOFOL N/A 06/29/2018   Procedure: ESOPHAGOGASTRODUODENOSCOPY (EGD) WITH PROPOFOL;  Surgeon: Virgel Manifold, MD;  Location: [facility];  Service: Endoscopy;  Laterality: N/A;   LAPAROSCOPIC CHOLECYSTECTOMY Left 10/26/1999   Procedure: LAPAROSCOPIC CHOLECYSTECTOMY; Location: [facility]; Surgeon: Rochel Brome, MD   LEFT HEART CATH AND CORONARY ANGIOGRAPHY N/A 11/18/2021   Procedure: LEFT HEART CATH AND CORONARY ANGIOGRAPHY;  Surgeon: Yolonda Kida, MD;  Location: [place] [facility];  Service: Cardiovascular;  Laterality: N/A;   LEFT HEART CATH AND CORS/GRAFTS ANGIOGRAPHY Left 11/19/2002   Procedure: LEFT HEART CATH AND CORS/GRAFTS ANGIOGRAPHY; Location: [place]; Surgeon: Katrine Coho, MD   LEFT HEART CATH AND CORS/GRAFTS ANGIOGRAPHY Left 09/17/2003   Procedure: LEFT HEART CATH AND CORS/GRAFTS ANGIOGRAPHY; Location: [place]; Surgeon: Katrine Coho, MD   LOWER EXTREMITY ANGIOGRAPHY Right 01/06/2022   Procedure: Lower Extremity Angiography;  Surgeon: Katha Cabal, MD;  Location: [place] [facility];  Service: Cardiovascular;  Laterality: Right;   RENAL ARTERY ANGIOPLASTY Bilateral 12/2013   TOE AMPUTATION Right    small toe   TONSILLECTOMY AND ADENOIDECTOMY     TOTAL HIP ARTHROPLASTY Left 2005   TOTAL HIP ARTHROPLASTY Right 2015   UPPER EXTREMITY ANGIOGRAPHY Left 11/23/2021   Procedure: UPPER EXTREMITY ANGIOGRAPHY;  Surgeon: Algernon Huxley, MD;  Location: [place] [facility];  Service: Cardiovascular;  Laterality: Left;    Social History Social History   Tobacco Use   Smoking status: Former    Types: Cigarettes    Quit date: 10/25/1998    Years since quitting: 23.4   Smokeless tobacco: Never  Vaping Use   Vaping Use: Never used  Substance Use Topics   Alcohol use: No    Alcohol/week: 0.0 standard drinks   Drug use: Never    Family History Family History  Problem Relation Age of Onset   Stroke  Mother    Hypertension Mother    Diabetes Mother    Hypertension Father    Heart disease Sister        MI   Multiple sclerosis Daughter    Multiple sclerosis Son    Cerebral aneurysm Son    Seizures Son    Cerebral aneurysm Son    Breast cancer Paternal Aunt 78    Allergies  Allergen Reactions   Citalopram Anaphylaxis    Throat closing    Dilaudid [Hydromorphone Hcl] Nausea And Vomiting   Hydrochlorothiazide Other (See Comments)    Decreased GFR (Nov 2015)   Liothyronine     Hair fell out, caused headaches    Nsaids     CKD stage III - avoid nephrotoxic drugs   Nubain [Nalbuphine Hcl]     Burning sensation in back   Penicillins Itching   Prasugrel Itching   Statins Itching     REVIEW OF SYSTEMS (Negative unless checked)  Constitutional: [] Weight loss  [] Fever  [] Chills Cardiac: [] Chest pain   [] Chest pressure   [] Palpitations   [] Shortness of breath when laying flat   [] Shortness of breath with exertion. Vascular:  [x] Pain in legs with walking   [] Pain in legs at rest  [] History of DVT   [] Phlebitis   [] Swelling in legs   [] Varicose veins   [] Non-healing ulcers Pulmonary:   [] Uses home oxygen   [] Productive cough   [] Hemoptysis   [] Wheeze  [] COPD   [] Asthma Neurologic:  [] Dizziness   [] Seizures   [] History of stroke   [] History of TIA  [] Aphasia   [] Vissual changes   [] Weakness or numbness in arm   [] Weakness or numbness in leg Musculoskeletal:   [] Joint swelling   [] Joint pain   [] Low back pain Hematologic:  [] Easy bruising  [] Easy bleeding   [] Hypercoagulable state   [] Anemic Gastrointestinal:  [] Diarrhea   [] Vomiting  [] Gastroesophageal reflux/heartburn   [] Difficulty swallowing. Genitourinary:  [] Chronic kidney disease   [] Difficult urination  [] Frequent urination   [] Blood in urine Skin:  [] Rashes   [] Ulcers  Psychological:  [] History of anxiety   []  History of major depression.  Physical Examination  Vitals:   03/17/22 0631  BP: (!) 181/79  Pulse: 67  Resp:  20  Temp: (!) 97.2 F (36.2 C)  TempSrc: Tympanic  SpO2: 99%  Weight: 71.7 kg  Height: 5\' 1"  (1.549 m)   Body mass index is 29.85 kg/m. Gen: WD/WN, NAD Head: [place]/AT, No temporalis wasting.  Ear/Nose/Throat: Hearing grossly intact, nares w/o erythema  or drainage Eyes: PER, EOMI, sclera nonicteric.  Neck: Supple, no masses.  No bruit or JVD.  Pulmonary:  Good air movement, no audible wheezing, no use of accessory muscles.  Cardiac: RRR, normal S1, S2, no Murmurs. Vascular:  mild trophic changes, no open wounds Vessel Right Left  Radial Palpable Palpable  PT Not Palpable Not Palpable  DP Not Palpable Not Palpable  Gastrointestinal: soft, non-distended. No guarding/no peritoneal signs.  Musculoskeletal: M/S 5/5 throughout.  No visible deformity.  Neurologic: CN 2-12 intact. Pain and light touch intact in extremities.  Symmetrical.  Speech is fluent. Motor exam as listed above. Psychiatric: Judgment intact, Mood & affect appropriate for pt's clinical situation. Dermatologic: No rashes or ulcers noted.  No changes consistent with cellulitis.   CBC Lab Results  Component Value Date   WBC 4.6 03/08/2022   HGB 10.7 (L) 03/08/2022   HCT 32.5 (L) 03/08/2022   MCV 88.8 03/08/2022   PLT 245 03/08/2022    BMET    Component Value Date/Time   NA 138 03/12/2022 1523   NA 139 09/13/2014 0000   NA 134 (L) 04/12/2014 0558   K 4.0 03/12/2022 1523   K 4.4 04/12/2014 0558   CL 104 03/12/2022 1523   CL 101 04/12/2014 0558   CO2 27 03/12/2022 1523   CO2 27 04/12/2014 0558   GLUCOSE 94 03/12/2022 1523   GLUCOSE 90 04/12/2014 0558   BUN 17 03/12/2022 1523   BUN 18 09/13/2014 0000   BUN 16 04/12/2014 0558   CREATININE 1.47 (H) 03/12/2022 1523   CREATININE 1.12 04/12/2014 0558   CALCIUM 9.4 03/12/2022 1523   CALCIUM 8.5 04/12/2014 0558   GFRNONAA 36 (L) 03/12/2022 1523   GFRNONAA 49 (L) 04/12/2014 0558   GFRAA 50 (L) 03/10/2020 1311   GFRAA 57 (L) 04/12/2014 0558   Estimated  Creatinine Clearance: 27.7 mL/min (A) (by C-G formula based on SCr of 1.47 mg/dL (H)).  COAG Lab Results  Component Value Date   INR 1.0 03/27/2014    Radiology MR Angiogram Head Wo Contrast  Result Date: 03/03/2022 CLINICAL DATA:  Follow-up examination for suspected ICA aneurysm. EXAM: MRA HEAD WITHOUT CONTRAST TECHNIQUE: Angiographic images of the Circle of Willis were acquired using MRA technique without intravenous contrast. COMPARISON:  Comparison made with recent brain MRI from 02/18/2022. FINDINGS: Anterior circulation: Visualized cervical left ICA widely patent with antegrade flow. Horizontal petrous left ICA widely patent as well. Atheromatous irregularity throughout the left carotid siphon without high-grade stenosis. 2.5 mm saccular outpouching arising from the cavernous left ICA consistent with a small aneurysm (series 5, image 98). This is directed medially. Additional lobulated irregular aneurysm measuring up to 3.8 mm extending posteriorly from the supraclinoid left ICA consistent with a left posterior communicating artery aneurysm (series 5, image 119). This accounts for the abnormality seen on prior brain MRI. The cervical right ICA is largely occluded with only intermittent and severely attenuated flow seen through the petrous segment. Distal reconstitution at the supraclinoid right ICA via collateral flow across the circle-of-Willis and/or the of the left posterior communicating artery. A1 segments patent. Right A1 hypoplastic. Normal anterior communicating artery complex. Both ACAs patent to their distal aspects without stenosis. Both M1 segments patent without stenosis. Normal right MCA bifurcation. There is a 2.4 mm saccular aneurysm arising from the left MCA bifurcation (series 5, image 143). This is directed medially. Distal MCA branches well perfused bilaterally. Posterior circulation: Right vertebral artery dominant and patent to the vertebrobasilar junction without stenosis.  Right PICA grossly patent at its origin. Left vertebral artery widely patent as it courses into the cranial vault. Left PICA patent. There is a focal severe stenosis involving the left V4 segment beyond the takeoff of the left PICA (series 5, image 28). Left V4 segment otherwise remains patent to the vertebrobasilar junction. Basilar patent to its distal aspect without stenosis. Anterior inferior cerebellar arteries patent. Superior cerebellar arteries patent bilaterally. Both PCA supplied via the basilar as well as bilateral posterior communicating arteries. Focal mild stenosis noted at the origin of the left P1 segment. PCAs otherwise well perfused to their distal aspects. Anatomic variants: Hypoplastic right A1 segment. Dominant right vertebral artery. Other: None. IMPRESSION: 1. 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 2. 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 3. 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 4. 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 5. Severe left V4 stenosis just beyond the takeoff of the left PICA. Dominant right vertebral artery widely patent. Electronically Signed   By: Jeannine Boga M.D.   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 Headache, chronic, new features or increased frequency. EXAM: MRI HEAD WITHOUT CONTRAST TECHNIQUE: Multiplanar, multiecho pulse sequences of the brain and surrounding structures were obtained without intravenous contrast. COMPARISON:  Head CT June 12, 2021. MRI of the brain June 02, 2012. FINDINGS: Brain: No acute infarction, hemorrhage, hydrocephalus, extra-axial collection or mass lesion. Small amount of scattered foci of T2 hyperintensity are seen within the white matter of the cerebral hemispheres, nonspecific, most likely related to chronic  small vessel ischemia. 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 Moderate parenchymal volume loss, more pronounced in the bilateral parietal are regions. Vascular: Known occlusion of the visualized upper cervical right internal carotid artery with the diminutive caliber at the distal cavernous and supraclinoid segment. A 3.7 mm outpouching from the supraclinoid left ICA may represent an aneurysm (series 10, image 11). Remainder of the major intracranial vessels have normal flow void. Skull and upper cervical spine: Normal marrow signal. Sinuses/Orbits: Bilateral lens surgery. Paranasal sinuses are essentially clear. Other: None. IMPRESSION: 1. No acute intracranial abnormality. 2. Mild chronic microvascular ischemic changes of the white matter. 3. 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 4. Moderate parenchymal volume loss with parietal predominance. 5. Suspected left ICA posterior communicating segment 3.7 mm aneurysm. Correlation with MR angiogram suggested. 6. 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 Electronically Signed   By: Pedro Earls M.D.   On: 02/18/2022 15:52   MM 3D SCREEN BREAST BILATERAL  Result Date: 02/19/2022 CLINICAL DATA:  Screening. EXAM: DIGITAL SCREENING BILATERAL MAMMOGRAM WITH TOMOSYNTHESIS AND CAD TECHNIQUE: Bilateral screening digital craniocaudal and mediolateral oblique mammograms were obtained. Bilateral screening digital breast tomosynthesis was performed. The images were evaluated with computer-aided detection. COMPARISON:  Previous exam(s). ACR Breast Density Category c: The breast tissue is heterogeneously dense, which may obscure small masses. FINDINGS: There are no findings suspicious for malignancy. IMPRESSION: No mammographic evidence of malignancy. A result letter of this screening mammogram will be mailed directly to the patient. RECOMMENDATION: Screening mammogram in one year. (Code:SM-B-01Y) BI-RADS CATEGORY  1:  Negative. Electronically Signed   By: Marin Olp M.D.   On: 02/19/2022 10:50     Assessment/Plan 1. Peripheral arterial disease with history of revascularization ([place])  Recommend:  The patient has evidence of severe atherosclerotic changes of both lower extremities associated with debilitating claudication.  t.  This represents a limb threatening ischemia and places the patient at the risk for limb loss.   Angiography has been performed and the situation is not ideal for intervention.  Given this finding open surgical repair is recommended.    Patient should undergo arterial reconstruction of the lower extremity, including bilateral femoral endarterectomy with left iliac stent placement.  The risks and benefits as well as the alternative therapies was discussed in detail with the patient.  All questions were answered.  Patient agrees to proceed with intervention.   The patient will follow up with me in the office after the procedure.      2. Essential hypertension Continue antihypertensive medications as already ordered, these medications have been reviewed and there are no changes at this time.    3. Mixed hyperlipidemia Continue statin as ordered and reviewed, no changes at this time    Hortencia Pilar, MD  03/17/2022 7:23 AM

## 2022-03-17 NOTE — Transfer of Care (Signed)
Immediate Anesthesia Transfer of Care Note  Patient: Sabrina [name]  Procedure(s) Performed: ENDARTERECTOMY FEMORAL ( BILATERAL SFA STENT) (Bilateral: Groin) INSERTION OF ILIAC STENT (Left: Groin) APPLICATION OF CELL SAVER  Patient Location: PACU  Anesthesia Type:General  Level of Consciousness: awake  Airway & Oxygen Therapy: Patient Spontanous Breathing and Patient connected to face mask oxygen  Post-op Assessment: Report given to RN and Post -op Vital signs reviewed and stable  Post vital signs: Reviewed and stable  Last Vitals:  Vitals Value Taken Time  BP    Temp    Pulse 58 03/17/22 1302  Resp 14 03/17/22 1302  SpO2 100 % 03/17/22 1302  Vitals shown include unvalidated device data.  Last Pain:  Vitals:   03/17/22 0631  TempSrc: Tympanic  PainSc: 8          Complications: No notable events documented.

## 2022-03-17 NOTE — Progress Notes (Signed)
  Chaplain On-Call responded to Code Blue notification at 1353 hours to the PACU, bed 3.  The medical team was providing urgent respiratory care for the patient, and were able to revive the patient. The patient was moved to the ICU, room 20.  No family was present.  Chaplain remains available for support if requested.  Chaplain Pollyann Samples M.Div., [facility]

## 2022-03-18 ENCOUNTER — Encounter: Payer: Self-pay | Admitting: Vascular Surgery

## 2022-03-18 ENCOUNTER — Inpatient Hospital Stay
Admission: RE | Admit: 2022-03-18 | Discharge: 2022-03-18 | Disposition: A | Discharge status: Home / Self Care | Payer: Medicare Other | Source: Home / Self Care | Attending: Internal Medicine | Admitting: Internal Medicine

## 2022-03-18 DIAGNOSIS — I70229 Atherosclerosis of native arteries of extremities with rest pain, unspecified extremity: Secondary | ICD-10-CM | POA: Diagnosis not present

## 2022-03-18 DIAGNOSIS — R092 Respiratory arrest: Secondary | ICD-10-CM

## 2022-03-18 DIAGNOSIS — I469 Cardiac arrest, cause unspecified: Secondary | ICD-10-CM

## 2022-03-18 DIAGNOSIS — F32A Depression, unspecified: Secondary | ICD-10-CM

## 2022-03-18 DIAGNOSIS — E038 Other specified hypothyroidism: Secondary | ICD-10-CM

## 2022-03-18 DIAGNOSIS — F419 Anxiety disorder, unspecified: Secondary | ICD-10-CM

## 2022-03-18 LAB — BASIC METABOLIC PANEL
Anion gap: 4 — ABNORMAL LOW (ref 5–15)
BUN: 18 mg/dL (ref 8–23)
CO2: 24 mmol/L (ref 22–32)
Calcium: 8 mg/dL — ABNORMAL LOW (ref 8.9–10.3)
Chloride: 108 mmol/L (ref 98–111)
Creatinine, Ser: 1.49 mg/dL — ABNORMAL HIGH (ref 0.44–1.00)
GFR, Estimated: 35 mL/min — ABNORMAL LOW (ref >60)
Glucose, Bld: 118 mg/dL — ABNORMAL HIGH (ref 70–99)
Potassium: 3.9 mmol/L (ref 3.5–5.1)
Sodium: 136 mmol/L (ref 135–145)

## 2022-03-18 LAB — CBC
HCT: 25.9 % — ABNORMAL LOW (ref 36.0–46.0)
Hemoglobin: 8.5 g/dL — ABNORMAL LOW (ref 12.0–15.0)
MCH: 28.5 pg (ref 26.0–34.0)
MCHC: 32.8 g/dL (ref 30.0–36.0)
MCV: 86.9 fL (ref 80.0–100.0)
Platelets: 204 10*3/uL (ref 150–400)
RBC: 2.98 MIL/uL — ABNORMAL LOW (ref 3.87–5.11)
RDW: 13.3 % (ref 11.5–15.5)
WBC: 9.3 10*3/uL (ref 4.0–10.5)
nRBC: 0 % (ref 0.0–0.2)

## 2022-03-18 LAB — ECHOCARDIOGRAM COMPLETE
Height: 61 in
S' Lateral: 2.1 cm
Weight: 2585.55 oz

## 2022-03-18 LAB — TROPONIN I (HIGH SENSITIVITY)
Troponin I (High Sensitivity): 15682 ng/L (ref <18)
Troponin I (High Sensitivity): 15443 ng/L (ref <18)

## 2022-03-18 LAB — HEPARIN LEVEL (UNFRACTIONATED): Heparin Unfractionated: 0.69 IU/mL (ref 0.30–0.70)

## 2022-03-18 LAB — SURGICAL PATHOLOGY

## 2022-03-18 LAB — MAGNESIUM: Magnesium: 1.9 mg/dL (ref 1.7–2.4)

## 2022-03-18 MED ORDER — HEPARIN (PORCINE) 25000 UT/250ML-% IV SOLN
700.0000 [IU]/h | INTRAVENOUS | Status: DC
  Administered 2022-03-18: 750 [IU]/h via INTRAVENOUS
  Administered 2022-03-19: 700 [IU]/h via INTRAVENOUS
  Filled 2022-03-18 (×3): qty 250

## 2022-03-18 MED ORDER — HEPARIN BOLUS VIA INFUSION
3000.0000 [IU] | Freq: Once | INTRAVENOUS | Status: AC
  Administered 2022-03-18: 3000 [IU] via INTRAVENOUS
  Filled 2022-03-18: qty 3000

## 2022-03-18 MED ORDER — ENOXAPARIN SODIUM 30 MG/0.3ML IJ SOSY
30.0000 mg | PREFILLED_SYRINGE | INTRAMUSCULAR | Status: DC
  Administered 2022-03-18: 30 mg via SUBCUTANEOUS
  Filled 2022-03-18: qty 0.3

## 2022-03-18 MED ORDER — ASPIRIN 81 MG PO CHEW
243.0000 mg | CHEWABLE_TABLET | Freq: Once | ORAL | Status: AC
  Administered 2022-03-18: 243 mg via ORAL
  Filled 2022-03-18: qty 3

## 2022-03-18 NOTE — Consult Note (Signed)
[place] for IV Heparin Indication: chest pain/ACS  Patient Measurements: Height: 5\' 1"  (154.9 cm) Weight: 73.3 kg (161 lb 9.6 oz) IBW/kg (Calculated) : 47.8 Heparin Dosing Weight: 63.8 kg  Labs: Recent Labs    03/17/22 1406 03/18/22 0007 03/18/22 1004 03/18/22 1900 03/18/22 2303  HGB 9.1* 8.5*  --   --   --   HCT 27.9* 25.9*  --   --   --   PLT 213 204  --   --   --   HEPARINUNFRC  --   --   --   --  0.69  CREATININE 1.52* 1.49*  --   --   --   CKTOTAL 189  --   --   --   --   TROPONINIHS 9  --  82,505* 15,443*  --      Estimated Creatinine Clearance: 27.6 mL/min (A) (by C-G formula based on SCr of 1.49 mg/dL (H)).   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 b.) TTE 11/16/2021: EF >55%; mild AS (MPG 9 mmHg)   CAD (coronary artery disease)    a.) s/p 3v CABG 03/29/2000   Carotid artery stenosis    a.) s/p LEFT CEA 09/09/2003. b.) Carotid doppler 39/76/7341: 9-37% LICA, CTO RICA; subclavian stenosis   Cataracts, bilateral    Cervical spondylosis without myelopathy    Chronic diastolic CHF (congestive heart failure), NYHA class 3 (HCC)    a.) TTE 05/27/2016: EF >55%, mild LA enlargement, triv PR, mild MR, mod TR; G3DD. b.) TTE 12/12/2017: EF >55%, mild LVH, BAE, mild MR/PR, mod TR; RVSP 52.8 mmHg. c.) TTE 03/18/2020: EF >55%, BAD, AS (MPG 8.7 mmHg); triv MR, mild TR/PR. d.) TTE 11/16/2021: EF >55%, LVH, G1DD, triv MR, mild PR, mod TR; AS (MPG 9 mmHg); MS (MPG 5 mmHg)   Chronic kidney disease, stage III (moderate)  (HCC)    Chronic narcotic use 06/24/2014   Chronic pain syndrome    GERD (gastroesophageal reflux disease)    History of 2019 novel coronavirus disease (COVID-19) 09/22/2021   Hyperlipidemia    Hypertension    Long term current use of antithrombotics/antiplatelets    a.) on daily DAPT therapy (ASA + clopidogrel)   Lumbar stenosis with neurogenic claudication    Mitral stenosis 11/16/2021   a.) TTE 11/16/2021: EF >55%; mod MS (MPG 5 mmHg)   Osteoarthritis of hip    Pulmonary hypertension ([place]) 12/12/2017   a.) TTE 12/12/2017: mild; RVSP 52.8 mmHg   PVD (peripheral vascular disease) (HCC)    Renal artery stenosis (HCC)    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    Medications:  No anticoagulation prior to admission per my chart review  Assessment: Patient is an 81 y/o F with medical history as above and including CAD s/p CABG, PVD,  carotid artery stenosis, renal artery stenosis who underwent elective bilateral common femoral, superficial femoral and profunda femoris endarterectomy 5/24. Post-operatively, patient went unresponsive requiring CPR x 2 minutes. Troponin today [id number]. Pharmacy consulted to manage heparin infusion for suspected ACS.  CBC with acute on chronic anemia (Hgb 8.5 with BL 11).    Goal of Therapy:  Heparin level 0.3-0.7 units/ml Monitor platelets by anticoagulation protocol: Yes   Plan:  5/25:  HL @ 2303 = 0.69, therapeutic X 1 Will continue pt on current rate and recheck HL on 5/26 @ 0700.   Armari Fussell D 03/18/2022,11:21 PM

## 2022-03-18 NOTE — Consult Note (Signed)
[name] MRN: [id number] DOB/AGE: 04-21-1941 81 y.o.  Admit date: 03/17/2022 Referring Physician Dr. Flora Lipps Primary Physician Dr. Deborra Medina Primary Cardiologist Dr. Lujean Amel Reason for Consultation cardiopulmonary arrest s/p resuscitation   HPI: Sabrina [name] is an 2yoF with a PMH of CAD s/p CABG x3 with LIMA-LAD, SVG-distal RCA and SVG-ramus intermedius 03/2000 with recent heart cath 11/18/2021 showing extensive native CAD and 100% occlusion SVG-RCA and 90%, distal SVG-OM with significant stenosis of her left subclavian artery, s/p left subclavian stenting on 11/23/2021, HFpEF (LVEF >55%, G1 DD mild-mild AS, mod-severe MS 10/2021), peripheral arterial disease, CKD 3, hypertension, hyperlipidemia, GERD, lumbar stenosis, chronic pain who presented to [facility] on 03/17/2022 for elective bilateral superior femoral artery and left iliac stenting with vascular surgery.  Cardiology is consulted for cardiopulmonary arrest post procedurally. Found to have an markedly elevated troponin at 15000, suggesting a perioperative MI.   The patient tolerated the procedure without apparent complications, however, postextubation in the PACU the patient drank some sips of water and became bradycardic (unclear if pulses were lost) and was given atropine and epinephrine and had 2 minutes of CPR to circulate the medicines and was bagged and subsequently intubated.  Her blood pressure after chest compressions was reportedly 320/180. She was extubated an hour later and has been hemodynamically stable since then. She does not remember much about the event itself other than asking for water. Her EKG yesterday afternoon shows new lateral ST depressions.  At my time of evaluation this morning the patient is sitting upright in bed, comfortable on room air.  She is anxious and worried that what happened yesterday after her procedure could happen again.   She notably denies chest pain now, but does have chest wall tenderness after chest compressions yesterday.  Denies shortness of breath, dizziness or presyncope.  Has some soreness in her legs as to be expected post operatively.  She is left subclavian artery stenting and saw her regular cardiologist March 30 of this year where she was doing well without anginal symptoms and medical therapy with ranexa was continued without plans for invasive evaluation.   BP 136/57 heart rate 77. In sinus rhythm on tele   Labs are notable for a initial troponin of 9 shortly after the arrest, repeat this morning is markedly elevated at 15,682, suggestive of a perioperative ischemic event.  BUN/creatinine 18/1 9, EGFR 35.  Lactate downtrending from 2.2-0.9 H&H 8.5/25.9.  Platelets 204.   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 b.) TTE 11/16/2021: EF >55%; mild AS (MPG 9 mmHg)   CAD (coronary artery disease)    a.) s/p 3v CABG 03/29/2000   Carotid artery stenosis    a.) s/p LEFT CEA 09/09/2003. b.) Carotid doppler 46/80/3212: 2-48% LICA, CTO RICA; subclavian stenosis   Cataracts, bilateral    Cervical spondylosis without myelopathy    Chronic diastolic CHF (congestive heart failure), NYHA class 3 ([place])  a.) TTE 05/27/2016: EF >55%, mild LA enlargement, triv PR, mild MR, mod TR; G3DD. b.) TTE 12/12/2017: EF >55%, mild LVH, BAE, mild MR/PR, mod TR; RVSP 52.8 mmHg. c.) TTE 03/18/2020: EF >55%, BAD, AS (MPG 8.7 mmHg); triv MR, mild  TR/PR. d.) TTE 11/16/2021: EF >55%, LVH, G1DD, triv MR, mild PR, mod TR; AS (MPG 9 mmHg); MS (MPG 5 mmHg)   Chronic kidney disease, stage III (moderate) (HCC)    Chronic narcotic use 06/24/2014   Chronic pain syndrome    GERD (gastroesophageal reflux disease)    History of 2019 novel coronavirus disease (COVID-19) 09/22/2021   Hyperlipidemia    Hypertension    Long term current use of antithrombotics/antiplatelets    a.) on daily DAPT therapy (ASA + clopidogrel)   Lumbar stenosis with neurogenic claudication    Mitral stenosis 11/16/2021   a.) TTE 11/16/2021: EF >55%; mod MS (MPG 5 mmHg)   Osteoarthritis of hip    Pulmonary hypertension ([place]) 12/12/2017   a.) TTE 12/12/2017: mild; RVSP 52.8 mmHg   PVD (peripheral vascular disease) (HCC)    Renal artery stenosis (HCC)    S/P CABG x 3 03/29/2000   a.) 3v CABG: LIMA-LAD, SVG-dRCA, SVG-RI   Secondary hyperparathyroidism (HCC)    SOB (shortness of breath)    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    Past Surgical History:  Procedure Laterality Date   ABDOMINAL HYSTERECTOMY  1976   CAROTID ARTERY ANGIOPLASTY Left    CAROTID ENDARTERECTOMY Left 09/09/2003   Procedure: CAROTID ENDARTERECTOMY; Location: [facility]; Surgeon: Maura Crandall, MD   COLONOSCOPY WITH PROPOFOL N/A 08/16/2017   Procedure: COLONOSCOPY WITH PROPOFOL;  Surgeon: [name], MD;  Location: [facility] ENDOSCOPY;  Service: Endoscopy;  Laterality: N/A;   CORONARY ANGIOPLASTY WITH STENT PLACEMENT  2000   CORONARY ARTERY BYPASS GRAFT N/A 03/29/2000   Procedure: 3v CORONARY ARTERY BYPASS GRAFT; Location: [facility]   CYSTOSCOPY WITH STENT PLACEMENT Bilateral    ENDARTERECTOMY FEMORAL Bilateral 03/17/2022   Procedure: ENDARTERECTOMY FEMORAL ( BILATERAL SFA STENT);  Surgeon: Katha Cabal, MD;  Location: [facility] ORS;  Service: Vascular;  Laterality: Bilateral;   ESOPHAGOGASTRODUODENOSCOPY (EGD) WITH PROPOFOL  N/A 08/16/2017   Procedure: ESOPHAGOGASTRODUODENOSCOPY (EGD) WITH PROPOFOL;  Surgeon: [name], MD;  Location: [facility] ENDOSCOPY;  Service: Endoscopy;  Laterality: N/A;   ESOPHAGOGASTRODUODENOSCOPY (EGD) WITH PROPOFOL N/A 06/29/2018   Procedure: ESOPHAGOGASTRODUODENOSCOPY (EGD) WITH PROPOFOL;  Surgeon: Virgel Manifold, MD;  Location: [facility] ENDOSCOPY;  Service: Endoscopy;  Laterality: N/A;   INSERTION OF ILIAC STENT Left 03/17/2022   Procedure: INSERTION OF ILIAC STENT;  Surgeon: Katha Cabal, MD;  Location: [facility] ORS;  Service: Vascular;  Laterality: Left;   LAPAROSCOPIC CHOLECYSTECTOMY Left 10/26/1999   Procedure: LAPAROSCOPIC CHOLECYSTECTOMY; Location: [facility]; Surgeon: Rochel Brome, MD   LEFT HEART CATH AND CORONARY ANGIOGRAPHY N/A 11/18/2021   Procedure: LEFT HEART CATH AND CORONARY ANGIOGRAPHY;  Surgeon: Yolonda Kida, MD;  Location: [facility];  Service: Cardiovascular;  Laterality: N/A;   LEFT HEART CATH AND CORS/GRAFTS ANGIOGRAPHY Left 11/19/2002   Procedure: LEFT HEART CATH AND CORS/GRAFTS ANGIOGRAPHY; Location: [facility]; Surgeon: Katrine Coho, MD   LEFT HEART CATH AND CORS/GRAFTS ANGIOGRAPHY Left 09/17/2003   Procedure: LEFT HEART CATH AND CORS/GRAFTS ANGIOGRAPHY; Location: [facility]; Surgeon: Katrine Coho, MD   LOWER EXTREMITY ANGIOGRAPHY Right 01/06/2022   Procedure: Lower Extremity Angiography;  Surgeon: Katha Cabal, MD;  Location: [facility];  Service: Cardiovascular;  Laterality: Right;   RENAL ARTERY ANGIOPLASTY Bilateral 12/2013   TOE AMPUTATION Right    small toe   TONSILLECTOMY AND ADENOIDECTOMY     TOTAL HIP ARTHROPLASTY Left 2005   TOTAL HIP ARTHROPLASTY Right 2015   UPPER EXTREMITY ANGIOGRAPHY Left 11/23/2021   Procedure: UPPER EXTREMITY ANGIOGRAPHY;  Surgeon: Algernon Huxley, MD;  Location: [place] [facility];  Service: Cardiovascular;  Laterality: Left;    Medications Prior to Admission  Medication Sig Dispense Refill Last Dose    Ascorbic Acid (VITAMIN C) 500 MG CAPS Take 500 mg by mouth daily. 30 capsule 3 03/16/2022   aspirin 81 MG tablet Take 81 mg by mouth daily.   03/16/2022   butalbital-acetaminophen-caffeine (FIORICET) 50-325-40 MG tablet Take 1 tablet by mouth every 4 (four) hours as needed for headache. 120 tablet 1 03/16/2022   docusate sodium (COLACE) 100 MG capsule Take 1 capsule (100 mg total) by mouth daily as needed for mild constipation or moderate constipation. 30 capsule 3 03/16/2022   ezetimibe (ZETIA) 10 MG tablet Take 1 tablet (10 mg total) by mouth daily. 90 tablet 1 03/17/2022   ferrous sulfate 325 (65 FE) MG EC tablet Take 1 tablet (325 mg total) by mouth 2 (two) times daily with a meal. 60 tablet 3 03/16/2022   hydrALAZINE (APRESOLINE) 100 MG tablet Take 1 tablet (100 mg total) by mouth 3 (three) times daily. Marland KitchenNOTE DOSE INCREASE TO 100 MG. KEEP ON FILE FOR FUTURE REFILLS 270 tablet 1 03/17/2022   labetalol (NORMODYNE) 300 MG tablet Take 1 tablet (300 mg total) by mouth 2 (two) times daily. For hypertension 180 tablet 1 03/17/2022   nortriptyline (PAMELOR) 10 MG capsule Take 1 capsule (10 mg total) by mouth 2 (two) times daily. For chronic headaches 180 capsule 1 03/17/2022   Omega 3 1000 MG CAPS Take 1,000 mg by mouth 2 (two) times daily.   03/16/2022   pantoprazole (PROTONIX) 40 MG tablet Take 1 tablet (40 mg total) by mouth daily. 30 tablet 5 03/17/2022   ranolazine (RANEXA) 500 MG 12 hr tablet Take 1 tablet (500 mg total) by mouth 2 (two) times daily. 60 tablet 3 03/17/2022   traMADol (ULTRAM) 50 MG tablet Take 1 tablet (50 mg total) by mouth every 6 (six) hours as needed. 120 tablet 5 03/16/2022   valsartan (DIOVAN) 320 MG tablet Take 1 tablet (320 mg total) by mouth daily. 90 tablet 0 03/16/2022   clopidogrel (PLAVIX) 75 MG tablet Take 1 tablet (75 mg total) by mouth daily. To prevent strokes 90 tablet 1 03/11/22   Social History   Socioeconomic History   Marital status: Divorced    Spouse name: Not on file    Number of children: 3   Years of education: Not on file   Highest education level: Not on file  Occupational History   Occupation: retired  Tobacco Use   Smoking status: Former    Types: Cigarettes    Quit date: 10/25/1998    Years since quitting: 23.4   Smokeless tobacco: Never  Vaping Use   Vaping Use: Never used  Substance and Sexual Activity   Alcohol use: No    Alcohol/week: 0.0 standard drinks   Drug use: Never   Sexual activity: Not Currently  Other Topics Concern   Not on file  Social History Narrative   Daughter [name] ([place]); 1 in [employer]; 1 in [place] alone   Social Determinants of Health   Financial  Resource Strain: Not on file  Food Insecurity: Not on file  Transportation Needs: Not on file  Physical Activity: Not on file  Stress: Not on file  Social Connections: Not on file  Intimate Partner Violence: Not on file    Family History  Problem Relation Age of Onset   Stroke Mother    Hypertension Mother    Diabetes Mother    Hypertension Father    Heart disease Sister        MI   Multiple sclerosis Daughter    Multiple sclerosis Son    Cerebral aneurysm Son    Seizures Son    Cerebral aneurysm Son    Breast cancer Paternal Aunt 81      PHYSICAL EXAM General: Pleasant elderly black female, well nourished, in no acute distress.  Sitting upright in ICU bed eating breakfast HEENT:  Normocephalic and atraumatic. Neck:  No JVD.  Lungs: Normal respiratory effort on room air. Clear bilaterally to auscultation. No wheezes, crackles, rhonchi.  Heart: HRRR . Normal S1 and S2 without gallops or murmurs. Radial & DP pulses 2+ bilaterally. Chest: Tenderness to palpation of her anterior chest wall Abdomen: Non-distended appearing.  Msk: Normal strength and tone for age. Extremities: Warm and well perfused. No clubbing, cyanosis. No peripheral edema.  Neuro: Alert and oriented X 3. Psych:  Anxious mood. Answers questions appropriately.   Labs:    Lab Results  Component Value Date   WBC 9.3 03/18/2022   HGB 8.5 (L) 03/18/2022   HCT 25.9 (L) 03/18/2022   MCV 86.9 03/18/2022   PLT 204 03/18/2022    Recent Labs  Lab 03/17/22 1406 03/18/22 0007  NA 134* 136  K 5.4* 3.9  CL 106 108  CO2 21* 24  BUN 20 18  CREATININE 1.52* 1.49*  CALCIUM 8.1* 8.0*  PROT 5.6*  --   BILITOT 0.7  --   ALKPHOS 50  --   ALT 57*  --   AST 76*  --   GLUCOSE 236* 118*   Lab Results  Component Value Date   CKTOTAL 189 03/17/2022   CKMB 1.8 01/30/2012   TROPONINI 0.04 01/30/2012    Lab Results  Component Value Date   CHOL 206 (H) 01/28/2022   CHOL 205 (H) 07/29/2020   CHOL 167 02/22/2018   Lab Results  Component Value Date   HDL 68 01/28/2022   HDL 49.10 07/29/2020   HDL 54.80 02/22/2018   Lab Results  Component Value Date   LDLCALC 123 (H) 01/28/2022   LDLCALC 130 (H) 07/29/2020   LDLCALC 102 (H) 02/22/2018   Lab Results  Component Value Date   TRIG 61 01/28/2022   TRIG 131.0 07/29/2020   TRIG 53.0 02/22/2018   Lab Results  Component Value Date   CHOLHDL 3.0 01/28/2022   CHOLHDL 4 07/29/2020   CHOLHDL 3 02/22/2018   Lab Results  Component Value Date   LDLDIRECT 109.0 03/05/2016      Radiology: MR Angiogram Head Wo Contrast  Result Date: 03/03/2022 CLINICAL DATA:  Follow-up examination for suspected ICA aneurysm. EXAM: MRA HEAD WITHOUT CONTRAST TECHNIQUE: Angiographic images of the Circle of Willis were acquired using MRA technique without intravenous contrast. COMPARISON:  Comparison made with recent brain MRI from 02/18/2022. FINDINGS: Anterior circulation: Visualized cervical left ICA widely patent with antegrade flow. Horizontal petrous left ICA widely patent as well. Atheromatous irregularity throughout the left carotid siphon without high-grade stenosis. 2.5 mm saccular outpouching arising from the cavernous left ICA consistent  with a small aneurysm (series 5, image 98). This is directed medially. Additional  lobulated irregular aneurysm measuring up to 3.8 mm extending posteriorly from the supraclinoid left ICA consistent with a left posterior communicating artery aneurysm (series 5, image 119). This accounts for the abnormality seen on prior brain MRI. The cervical right ICA is largely occluded with only intermittent and severely attenuated flow seen through the petrous segment. Distal reconstitution at the supraclinoid right ICA via collateral flow across the circle-of-Willis and/or the of the left posterior communicating artery. A1 segments patent. Right A1 hypoplastic. Normal anterior communicating artery complex. Both ACAs patent to their distal aspects without stenosis. Both M1 segments patent without stenosis. Normal right MCA bifurcation. There is a 2.4 mm saccular aneurysm arising from the left MCA bifurcation (series 5, image 143). This is directed medially. Distal MCA branches well perfused bilaterally. Posterior circulation: Right vertebral artery dominant and patent to the vertebrobasilar junction without stenosis. Right PICA grossly patent at its origin. Left vertebral artery widely patent as it courses into the cranial vault. Left PICA patent. There is a focal severe stenosis involving the left V4 segment beyond the takeoff of the left PICA (series 5, image 28). Left V4 segment otherwise remains patent to the vertebrobasilar junction. Basilar patent to its distal aspect without stenosis. Anterior inferior cerebellar arteries patent. Superior cerebellar arteries patent bilaterally. Both PCA supplied via the basilar as well as bilateral posterior communicating arteries. Focal mild stenosis noted at the origin of the left P1 segment. PCAs otherwise well perfused to their distal aspects. Anatomic variants: Hypoplastic right A1 segment. Dominant right vertebral artery. Other: None. IMPRESSION: 1. 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 2. 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 3. 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 4. 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 5. Severe left V4 stenosis just beyond the takeoff of the left PICA. Dominant right vertebral artery widely patent. Electronically Signed   By: Jeannine Boga M.D.   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 Headache, chronic, new features or increased frequency. EXAM: MRI HEAD WITHOUT CONTRAST TECHNIQUE: Multiplanar, multiecho pulse sequences of the brain and surrounding structures were obtained without intravenous contrast. COMPARISON:  Head CT June 12, 2021. MRI of the brain June 02, 2012. FINDINGS: Brain: No acute infarction, hemorrhage, hydrocephalus, extra-axial collection or mass lesion. Small amount of scattered foci of T2 hyperintensity are seen within the white matter of the cerebral hemispheres, nonspecific, most likely related to chronic small vessel ischemia. 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 Moderate parenchymal volume loss, more pronounced in the bilateral parietal are regions. Vascular: Known occlusion of the visualized upper cervical right internal carotid artery with the diminutive caliber at the distal cavernous and supraclinoid segment. A 3.7 mm outpouching from the supraclinoid left ICA may represent an aneurysm (series 10, image 11). Remainder of the major intracranial vessels have normal flow void. Skull and upper cervical spine: Normal marrow signal. Sinuses/Orbits: Bilateral lens surgery. Paranasal sinuses are essentially clear. Other: None. IMPRESSION: 1. No acute intracranial abnormality. 2. Mild chronic microvascular ischemic changes of the white matter. 3. 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 4. Moderate parenchymal volume loss with parietal predominance. 5. Suspected  left ICA posterior communicating segment 3.7 mm aneurysm. Correlation with MR angiogram suggested. 6. 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 Electronically Signed   By: Pedro Earls M.D.   On: 02/18/2022 15:52   DG C-Arm 1-60 Min-No Report  Result Date: 03/17/2022 Fluoroscopy was utilized by the requesting physician.  No radiographic interpretation.   MM 3D SCREEN BREAST BILATERAL  Result Date: 02/19/2022 CLINICAL DATA:  Screening. EXAM: DIGITAL SCREENING BILATERAL MAMMOGRAM WITH TOMOSYNTHESIS AND CAD TECHNIQUE: Bilateral screening digital craniocaudal and mediolateral oblique mammograms were obtained. Bilateral screening digital breast tomosynthesis was performed. The images were evaluated with computer-aided detection. COMPARISON:  Previous exam(s). ACR Breast Density Category c: The breast tissue is heterogeneously dense, which may obscure small masses. FINDINGS: There are no findings suspicious for malignancy. IMPRESSION: No mammographic evidence of malignancy. A result letter of this screening mammogram will be mailed directly to the patient. RECOMMENDATION: Screening mammogram in one year. (Code:SM-B-01Y) BI-RADS CATEGORY  1: Negative. Electronically Signed   By: Marin Olp M.D.   On: 02/19/2022 10:50   ECHOCARDIOGRAM COMPLETE  Result Date: 03/18/2022    ECHOCARDIOGRAM REPORT   Patient Name:   [facility] Date of Exam: 03/18/2022 Medical Rec #:  [id number]          Height:       61.0 in Accession #:    [id number]         Weight:       161.6 lb Date of Birth:  08-05-1941          BSA:          1.725 m Patient Age:    25 years           BP:           133/64 mmHg Patient Gender: F                  HR:           73 bpm. Exam Location:  [facility] Procedure: 2D Echo, Cardiac Doppler and Color Doppler Indications:     Cardiac arrest I46.9  History:         Patient has no prior history of Echocardiogram examinations.                  CHF, CAD, Prior CABG; Risk  Factors:Hypertension.  Sonographer:     Sherrie Sport Referring Phys:  [id number] Flora Lipps Diagnosing Phys: Donnelly Angelica  Sonographer Comments: Technically challenging study due to limited acoustic windows, no apical window and no subcostal window. IMPRESSIONS  1. Left ventricular ejection fraction, by estimation, is 60 to 65%. The left ventricle has normal function. The left ventricle has no regional wall motion abnormalities. There is moderate left ventricular hypertrophy. Left ventricular diastolic function  could not be evaluated.  2. The mitral valve is abnormal. Trivial mitral valve regurgitation. Moderate mitral annular calcification.  3. The aortic valve is calcified. Aortic valve regurgitation is not visualized. Conclusion(s)/Recommendation(s): Very limited study. Normal LV function. Unable to assess for mitral stensos which previously was moderate to severe. Unable to assess for AS, and no TR velocity obtained to assess RVSP. FINDINGS  Left Ventricle: Left ventricular ejection fraction, by estimation, is 60 to 65%. The left ventricle has normal function. The left ventricle has no regional wall motion abnormalities. The left ventricular internal cavity size was normal in size. There is  moderate left ventricular hypertrophy. Left ventricular diastolic function could not be evaluated. Pericardium: There is no evidence of pericardial effusion. Mitral Valve: The mitral valve is abnormal. Moderate mitral annular  calcification. Trivial mitral valve regurgitation. Tricuspid Valve: The tricuspid valve is normal in structure. Tricuspid valve regurgitation is mild. Aortic Valve: The aortic valve is calcified. Aortic valve regurgitation is not visualized. Pulmonic Valve: The pulmonic valve was not well visualized. Pulmonic valve regurgitation is not visualized. No evidence of pulmonic stenosis. Aorta: The aortic root is normal in size and structure.  LEFT VENTRICLE PLAX 2D LVIDd:         3.80 cm LVIDs:         2.10 cm LV  PW:         1.30 cm LV IVS:        1.30 cm LVOT diam:     2.00 cm LVOT Area:     3.14 cm  LEFT ATRIUM         Index LA diam:    3.90 cm 2.26 cm/m                        PULMONIC VALVE AORTA                 PV Vmax:        0.85 m/s Ao Root diam: 2.90 cm PV Vmean:       48.900 cm/s                       PV VTI:         0.124 m                       PV Peak grad:   2.9 mmHg                       PV Mean grad:   1.0 mmHg                       RVOT Peak grad: 6 mmHg   SHUNTS Systemic Diam: 2.00 cm Pulmonic VTI:  0.241 m Donnelly Angelica Electronically signed by Donnelly Angelica Signature Date/Time: 03/18/2022/10:44:10 AM    Final     ECHO 11/16/2021 ECHOCARDIOGRAPHIC MEASUREMENTS  2D DIMENSIONS  AORTA                  Values   Normal Range   MAIN PA         Values    Normal Range                Annulus: 1.5 cm       [2.1-2.5]         PA Main: nm*       [1.5-2.1]              Aorta Sin: nm*          [2.7-3.3]    RIGHT VENTRICLE            ST Junction: nm*          [2.3-2.9]         RV Base: nm*       [<4.2]              Asc.Aorta: 2.6 cm       [2.3-3.1]          RV Mid: 3.0 cm    [<3.5]  LEFT VENTRICLE  RV Length: nm*       [<8.6]                  LVIDd: 3.7 cm       [3.9-5.3]    INFERIOR VENA CAVA                  LVIDs: 2.5 cm                        Max. IVC: nm*       [<=2.1]                     FS: 33.1 %       [>25]            Min. IVC: nm*                    SWT: 1.2 cm       [0.5-0.9]    ------------------                    PWT: 1.1 cm       [0.5-0.9]    nm* - not measured  LEFT ATRIUM                LA Diam: 4.2 cm       [2.7-3.8]            LA A4C Area: nm*          [<20]              LA Volume: nm*          [22-52]  _________________________________________________________________________________________  ECHOCARDIOGRAPHIC DESCRIPTIONS  AORTIC ROOT                   Size: Normal             Dissection: INDETERM FOR DISSECTION  AORTIC VALVE                Leaflets: Tricuspid                   Morphology: MILDLY THICKENED               Mobility: Fully mobile  LEFT VENTRICLE                   Size: Normal                        Anterior: Normal            Contraction: Normal                         Lateral: Normal             Closest EF: >55% (Estimated)                Septal: Normal              LV Masses: No Masses                       Apical: Normal                    LVH: MILD LVH                      Inferior: Normal  Posterior: Normal           Dias.FxClass: (Grade 1) relaxation abnormal, E/A reversal  MITRAL VALVE               Leaflets: Normal                        Mobility: PARTIALLY MOBILE             Morphology: THICKENED LEAFLET(S)  LEFT ATRIUM                   Size: MILDLY ENLARGED              LA Masses: No masses              IA Septum: Normal IAS  MAIN PA                   Size: Normal  PULMONIC VALVE             Morphology: Normal                        Mobility: Fully mobile  RIGHT VENTRICLE              RV Masses: No Masses                         Size: Normal              Free Wall: Normal                     Contraction: Normal  TRICUSPID VALVE               Leaflets: Normal                        Mobility: Fully mobile             Morphology: Normal  RIGHT ATRIUM                   Size: Normal                        RA Other: None                RA Mass: No masses  PERICARDIUM                  Fluid: No effusion  INFERIOR VENACAVA                   Size: Normal Normal respiratory collapse  _________________________________________________________________________________________   DOPPLER ECHO and OTHER SPECIAL PROCEDURES                 Aortic: No AR                      TRIVIAL AS                         207.0 cm/sec peak vel      17.1 mmHg peak grad                         9.0 mmHg mean grad                 Mitral: TRIVIAL MR  MODERATE MS                          164.0 cm/sec peak vel      10.8 mmHg peak grad                         5.0 mmHg mean grad                         MV Inflow E Vel = 151.0 cm/sec      MV Annulus E'Vel = 8.8 cm/sec                         E/E'Ratio = 17.2              Tricuspid: MODERATE TR                No TS                         330.0 cm/sec peak TR vel   53.6 mmHg peak RV pressure              Pulmonary: MILD PR                    No PS                         111.0 cm/sec peak vel      4.9 mmHg peak grad  _________________________________________________________________________________________  INTERPRETATION  NORMAL LEFT VENTRICULAR SYSTOLIC FUNCTION   WITH MILD LVH  NORMAL RIGHT VENTRICULAR SYSTOLIC FUNCTION  QZR(AQT)=.62UQ^3  MVA(VTI)=1.17cm^2  MODERATE TR  MODERATE MS  TRIVIAL AS  TRIVIAL MR  MILD PR  EF >55%  Mild/Mod AS  Mod /Severe MS   TELEMETRY reviewed by me: sinus rhythm 81 with 3 beats of NSVT  ASSESSMENT AND PLAN:  Keerthana Harston is an 69yoF with a PMH of CAD s/p CABG x3 with LIMA-LAD, SVG-distal RCA and SVG-ramus intermedius 03/2000 with recent heart cath 11/18/2021 showing extensive native CAD and 100% occlusion SVG-RCA and 90%, distal SVG-OM with significant stenosis of her left subclavian artery, s/p left subclavian stenting on 11/23/2021, HFpEF (LVEF >55%, G1 DD mild-mild AS, mod-severe MS 10/2021), peripheral arterial disease, CKD 3, hypertension, hyperlipidemia, GERD, lumbar stenosis, chronic pain who presented to [facility] on 03/17/2022 for elective bilateral superior femoral artery and left iliac stenting with vascular surgery.  Cardiology is consulted for cardiopulmonary arrest post procedurally. Found to have an markedly elevated troponin at 15000, suggesting a perioperative MI.   #Cardiopulmonary arrest s/p successful resuscitation likely 2/2 perioperative NSTEMI  #s/p bilateral superior femoral and left iliac endarterectomies & stenting 03/17/22 #h/o left subclavian  artery stenting 11/23/21 #CAD s/p three-vessel CABG  The patient presented for elective vascular surgery on 5/25 and she tolerated the long procedure well.  However, in the PACU postextubation she drank some water and had what was thought to be a bradycardic/vagal cardiopulmonary arrest from possibly aspiration, was intubated for 1 hour in the ICU, and was successfully extubated.  Initial troponin was checked at the time of the event was 9, repeat this morning was markedly elevated at 15,000 and EKG shows new lateral ST depressions, suggesting a perioperative NSTEMI. Currently denies any chest pain but does have extensive CAD with known occlusions of her multiple vein  grafts, in addition to CKD 3, and limited vascular access after her stenting. -Continue supportive postoperative care per vascular surgery and primary -Give 325 mg aspirin now, continue DAPT with aspirin $RemoveBefo'81mg'GbGzBnPjpFw$  and Plavix $RemoveBe'75mg'oPwynyhfE$  daily  -Start heparin drip for 48 hours -Continue Ranexa 500 mg twice daily -Echocardiogram complete was a technically very limited study but does show preserved LVEF 60-65% without regional wall motion abnormalities, moderate MR which is similar to prior echo, but the aortic valve was unable to be assessed for progression of her known aortic stenosis. -We will continue conservative medical management for now with further discussions regarding potential cardiac catheterization during this hospitalization on an outpatient basis based on her clinical course  #Hypertension Continue home medications which include hydralazine 100 mg 3 times daily, Ibresartan 300 mg once daily labetalol 300 mg twice daily  #CKD 3 Renal function around baseline with creatinine 1.49, EGFR 35 today.  This patient's plan of care was discussed and created with Dr. Donnelly Angelica and he is in agreement.  Signed: Tristan Schroeder , PA-C 03/18/2022, 12:55 PM [facility]

## 2022-03-18 NOTE — Progress Notes (Signed)
OT Cancellation Note  Patient Details Name: Sabrina [name] MRN: [id number] DOB: 01-05-1941   Cancelled Treatment:    Reason Eval/Treat Not Completed: Other (comment). Consult received, chart reviewed. Pt noted with cardiac arrest requiring CPR and intubation after vascular procedure. Due to change in pt status, will require new OT order to initiate therapy. MD notified. Will sign off until new orders are placed once medically appropriate.   Ardeth Perfect., MPH, MS, OTR/L ascom [phone] 03/18/22, 1:04 PM

## 2022-03-18 NOTE — Progress Notes (Signed)
1515: Assumed care of patient who is resting in bed with family bedside. Patient complaining of soreness and chest pain, medicated (per MAR). Heparin gtt initiated, patient and family educated on medication questions and concerns addressed.   1615: Patient states "the doctor told me I needed a heart cath, but I told him I was too sore. If he wants to do it because I need it, I will do it." Discussion with family about current plan of care (from report from prev RN) is to medically manage and potentially cath, but not likely tomorrow. Patient visibly anxious and states she wants to follow recommended plan of care she is just anxious as she is already sore and does not "want what happened yesterday to happen again." Patient requesting this writer to inform MD that she is willing to have cardiac cath, secure chat sent.   1625: Per cardiology, cardiac cath will not be tomorrow as patient is recovering from yesterdays procedure. Patient and family verbalize understanding. Patient with no requests at this time.   1805: Patient with dinner tray and eating in bed comfortably. Patient vital signs stable with no requests at this time.

## 2022-03-18 NOTE — Consult Note (Signed)
ANTICOAGULATION CONSULT NOTE  Pharmacy Consult for IV Heparin Indication: chest pain/ACS  Patient Measurements: Height: 5\' 1"  (154.9 cm) Weight: 73.3 kg (161 lb 9.6 oz) IBW/kg (Calculated) : 47.8 Heparin Dosing Weight: 63.8 kg  Labs: Recent Labs    03/17/22 1406 03/18/22 0007 03/18/22 1004  HGB 9.1* 8.5*  --   HCT 27.9* 25.9*  --   PLT 213 204  --   CREATININE 1.52* 1.49*  --   CKTOTAL 189  --   --   TROPONINIHS 9  --  60,454*    Estimated Creatinine Clearance: 27.6 mL/min (A) (by C-G formula based on SCr of 1.49 mg/dL (H)).   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 b.) TTE 11/16/2021: EF >55%; mild AS (MPG 9 mmHg)   CAD (coronary artery disease)    a.) s/p 3v CABG 03/29/2000   Carotid artery stenosis    a.) s/p LEFT CEA 09/09/2003. b.) Carotid doppler 09/81/1914: 7-82% LICA, CTO RICA; subclavian stenosis   Cataracts, bilateral    Cervical spondylosis without myelopathy    Chronic diastolic CHF (congestive heart failure), NYHA class 3 (HCC)    a.) TTE 05/27/2016: EF >55%, mild LA enlargement, triv PR, mild MR, mod TR; G3DD. b.) TTE 12/12/2017: EF >55%, mild LVH, BAE, mild MR/PR, mod TR; RVSP 52.8 mmHg. c.) TTE 03/18/2020: EF >55%, BAD, AS (MPG 8.7 mmHg); triv MR, mild TR/PR. d.) TTE 11/16/2021: EF >55%, LVH, G1DD, triv MR, mild PR, mod TR; AS (MPG 9 mmHg); MS (MPG 5 mmHg)   Chronic kidney disease, stage III (moderate) (HCC)    Chronic narcotic use 06/24/2014   Chronic pain syndrome    GERD (gastroesophageal reflux disease)    History of 2019 novel  coronavirus disease (COVID-19) 09/22/2021   Hyperlipidemia    Hypertension    Long term current use of antithrombotics/antiplatelets    a.) on daily DAPT therapy (ASA + clopidogrel)   Lumbar stenosis with neurogenic claudication    Mitral stenosis 11/16/2021   a.) TTE 11/16/2021: EF >55%; mod MS (MPG 5 mmHg)   Osteoarthritis of hip    Pulmonary hypertension ([place]) 12/12/2017   a.) TTE 12/12/2017: mild; RVSP 52.8 mmHg   PVD (peripheral vascular disease) (HCC)    Renal artery stenosis (HCC)    S/P CABG x 3 03/29/2000   a.) 3v CABG: LIMA-LAD, SVG-dRCA, SVG-RI   Secondary hyperparathyroidism (HCC)    SOB (shortness of breath)    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    Medications:  No anticoagulation prior to admission per my chart review  Assessment: Patient is an 81 y/o F with medical history as above and including CAD s/p CABG, PVD, carotid artery stenosis, renal artery stenosis who underwent elective bilateral common femoral, superficial femoral and profunda femoris endarterectomy 5/24. Post-operatively, patient went unresponsive requiring CPR x 2 minutes. Troponin today [id number]. Pharmacy consulted to manage heparin infusion for suspected ACS.  CBC with acute on chronic anemia (Hgb 8.5 with BL 11).  Goal of Therapy:  Heparin level 0.3-0.7 units/ml Monitor platelets by anticoagulation protocol: Yes   Plan:  --Heparin 3000 unit IV bolus followed by continuous infusion at 750 units/hr --HL 8 hours after initiation of infusion --Daily CBC per protocol while on IV heparin --Plan for IV heparin x 48 hours per cardiology for NSTEMI  Benita Gutter 03/18/2022,3:24 PM

## 2022-03-18 NOTE — Progress Notes (Signed)
PROGRESS NOTE  Sabrina [name]    DOB: May 18, 1941, 81 y.o.  FBP:[id number]    Code Status: Full Code   DOA: 03/17/2022   LOS: 1   Brief hospital course  Sabrina [name] is a 81 y.o. female with a PMH significant for PAD, HTN, HLD, PVD, CAD s/p CABG, aortic stenosis, pulmonary HTN, GAD, chronic hip pain, GERD, hypothyroidism, CKD III.  They presented from home to the [facility] on 03/17/2022 for bilateral lower extremity endarterectomy in treatment of severe PAD and claudication.  5/24 b/l femoral endarterectomy and stenting 5/24 post procedure, experienced acute cardiac arrest, placed on MV support 5/24 extubated  03/18/22 -stable  Assessment & Plan  Principal Problem:   Atherosclerosis of artery of extremity with rest pain (HCC)  Severe ACUTE Hypoxic and Hypercapnic Respiratory Failure- s/p intubation. She is stable ORA without respiratory symptoms   CARDIAC FAILURE-NSTEMI/CARDIAC ARREST- stable. Troponins elevated to 15,682. She is endorsing significant pain of chest wall - cardiology consulted, appreciate recs - continue heparin gtt   ACUTE KIDNEY INJURY on CKDIII- appears to be close to baseline. Cr 1.49 today - BMP am  PAD- - vascular surgery consulted, appreciate recs  CAD s/p CABG  HLD  HTN- - continue ASA, plavix, zetia - continue home antihypertensives  Hypothyroidism-  - continue home levothyroxine  Anxiety/depression- chronic - continue home medications  Body mass index is 30.53 kg/m.  VTE ppx: enoxaparin (LOVENOX) injection 30 mg Start: 03/18/22 1000 TED hose Start: 03/17/22 1428   Diet:     Diet   Diet regular Room service appropriate? Yes; Fluid consistency: Thin   Consultants: Cardiology CCM Vascular surgery Subjective 03/18/22    Pt reports feeling better. She describes significant chest wall pain. Denies SOB. No pain at site of endarterectomy. Endorses numbness in right inner thigh   Objective   Vitals:   03/18/22 0300 03/18/22 0400  03/18/22 0500 03/18/22 0600  BP: (!) 143/55 128/77 (!) 131/45 127/79  Pulse: 71 71 72 73  Resp: 19 18 17 19   Temp:  97.9 F (36.6 C)    TempSrc:  Oral    SpO2: 97% 97% 95% 97%  Weight:      Height:        Intake/Output Summary (Last 24 hours) at 03/18/2022 0817 Last data filed at 03/18/2022 0600 Gross per 24 hour  Intake 3415.84 ml  Output 1625 ml  Net 1790.84 ml   Filed Weights   03/17/22 0631 03/17/22 1428  Weight: 71.7 kg 73.3 kg     Physical Exam:  General: awake, alert, NAD HEENT: atraumatic, clear conjunctiva, anicteric sclera, MMM, hearing grossly normal Respiratory: normal respiratory effort. Cardiovascular: quick capillary refill, normal S1/S2, RRR, no JVD, murmurs Gastrointestinal: soft, NT, ND Nervous: A&O x3. no gross focal neurologic deficits, normal speech Extremities: moves all equally, no edema, normal tone Skin: dry, intact, normal temperature, normal color. No rashes, lesions or ulcers on exposed skin Psychiatry: anxious mood, congruent affect  Labs   I have personally reviewed the following labs and imaging studies CBC    Component Value Date/Time   WBC 9.3 03/18/2022 0007   RBC 2.98 (L) 03/18/2022 0007   HGB 8.5 (L) 03/18/2022 0007   HGB 9.2 (L) 04/13/2014 0952   HCT 25.9 (L) 03/18/2022 0007   HCT 24.7 (L) 04/12/2014 1143   PLT 204 03/18/2022 0007   PLT 182 04/11/2014 0558   MCV 86.9 03/18/2022 0007   MCV 85 03/27/2014 0944   MCH 28.5 03/18/2022 0007  MCHC 32.8 03/18/2022 0007   RDW 13.3 03/18/2022 0007   RDW 13.7 03/27/2014 0944   LYMPHSABS 1.2 03/17/2022 1406   MONOABS 0.1 03/17/2022 1406   EOSABS 0.0 03/17/2022 1406   BASOSABS 0.0 03/17/2022 1406      Latest Ref Rng & Units 03/18/2022   12:07 AM 03/17/2022    2:06 PM 03/12/2022    3:23 PM  BMP  Glucose 70 - 99 mg/dL 118   236   94    BUN 8 - 23 mg/dL 18   20   17     Creatinine 0.44 - 1.00 mg/dL 1.49   1.52   1.47    Sodium 135 - 145 mmol/L 136   134   138    Potassium 3.5 - 5.1  mmol/L 3.9   5.4   4.0    Chloride 98 - 111 mmol/L 108   106   104    CO2 22 - 32 mmol/L 24   21   27     Calcium 8.9 - 10.3 mg/dL 8.0   8.1   9.4      DG C-Arm 1-60 Min-No Report  Result Date: 03/17/2022 Fluoroscopy was utilized by the requesting physician.  No radiographic interpretation.    Disposition Plan & Communication  Patient status: Inpatient  Admitted From: Home Planned disposition location: Home Anticipated discharge date: 5/27 pending cardiology clearance   Family Communication: none    Author: Richarda Osmond, DO [facility] 03/18/2022, 8:17 AM   Available by Epic secure chat 7AM-7PM. If 7PM-7AM, please contact night-coverage.  [facility] contact information found on [email or web address].

## 2022-03-18 NOTE — Progress Notes (Signed)
PT Cancellation Note  Patient Details Name: Sabrina [name] MRN: [id number] DOB: [birth year]   Cancelled Treatment:    Reason Eval/Treat Not Completed: Patient not medically ready. Patient with a acute cardiac arrest that required brief mechanical ventilation following a vascular procedure since PT order placed. Cardiac work-up being completed. Please order PT when appropriate as needed.   Minna Merritts, PT, MPT  [name] 03/18/2022, 1:25 PM

## 2022-03-18 NOTE — Anesthesia Postprocedure Evaluation (Signed)
Anesthesia Post Note  Patient: Leanor A Knipple  Procedure(s) Performed: ENDARTERECTOMY FEMORAL ( BILATERAL SFA STENT) (Bilateral: Groin) INSERTION OF ILIAC STENT (Left: Groin) APPLICATION OF CELL SAVER  Patient location during evaluation: SICU Anesthesia Type: General Level of consciousness: awake and alert Pain management: pain level controlled Vital Signs Assessment: post-procedure vital signs reviewed and stable Respiratory status: spontaneous breathing, respiratory function stable and nonlabored ventilation Cardiovascular status: stable Postop Assessment: no apparent nausea or vomiting Anesthetic complications: no   No notable events documented.   Last Vitals:  Vitals:   03/18/22 0500 03/18/22 0600  BP: (!) 131/45 127/79  Pulse: 72 73  Resp: 17 19  Temp:    SpO2: 95% 97%    Last Pain:  Vitals:   03/18/22 0430  TempSrc:   PainSc: [place]

## 2022-03-18 NOTE — Progress Notes (Signed)
 Vein and Vascular Surgery  Daily Progress Note   Subjective  -   Patient notes chest soreness following recent chest compressions.  Mentation is clear.  Patient is anxious.  Objective Vitals:   03/18/22 1930 03/18/22 2053 03/18/22 2100 03/18/22 2200  BP: (!) 145/52 (!) 169/69  (!) 188/77  Pulse: 90 94  94  Resp: (!) 25 (!) 21  (!) 21  Temp:   99.7 F (37.6 C)   TempSrc:   Oral   SpO2: 96% 99%  97%  Weight:      Height:        Intake/Output Summary (Last 24 hours) at 03/18/2022 2234 Last data filed at 03/18/2022 2200 Gross per 24 hour  Intake 1474.3 ml  Output 1220 ml  Net 254.3 ml    PULM  CTAB CV  RRR VASC  bilateral groins soft wound vacs in place.  Bilateral feet warm  Laboratory CBC    Component Value Date/Time   WBC 9.3 03/18/2022 0007   HGB 8.5 (L) 03/18/2022 0007   HGB 9.2 (L) 04/13/2014 0952   HCT 25.9 (L) 03/18/2022 0007   HCT 24.7 (L) 04/12/2014 1143   PLT 204 03/18/2022 0007   PLT 182 04/11/2014 0558    BMET    Component Value Date/Time   NA 136 03/18/2022 0007   NA 139 09/13/2014 0000   NA 134 (L) 04/12/2014 0558   K 3.9 03/18/2022 0007   K 4.4 04/12/2014 0558   CL 108 03/18/2022 0007   CL 101 04/12/2014 0558   CO2 24 03/18/2022 0007   CO2 27 04/12/2014 0558   GLUCOSE 118 (H) 03/18/2022 0007   GLUCOSE 90 04/12/2014 0558   BUN 18 03/18/2022 0007   BUN 18 09/13/2014 0000   BUN 16 04/12/2014 0558   CREATININE 1.49 (H) 03/18/2022 0007   CREATININE 1.12 04/12/2014 0558   CALCIUM 8.0 (L) 03/18/2022 0007   CALCIUM 8.5 04/12/2014 0558   GFRNONAA 35 (L) 03/18/2022 0007   GFRNONAA 49 (L) 04/12/2014 0558   GFRAA 50 (L) 03/10/2020 1311   GFRAA 57 (L) 04/12/2014 0558    Assessment/Planning: POD #1 s/p bilateral femoral endarterectomy with left iliac stent placement  The patient understandably has uncomfortable pain in her chest post compressions.  Her feet are bilaterally warm with bilateral soft groins.  Pending cardiology consult  given vagal event post intervention.   Kris Hartmann  03/18/2022, 10:34 PM

## 2022-03-18 NOTE — Progress Notes (Signed)
PHARMACIST - PHYSICIAN COMMUNICATION  CONCERNING:  Enoxaparin (Lovenox) for DVT Prophylaxis    RECOMMENDATION: Patient was prescribed enoxaparin 40mg  q24 hours for VTE prophylaxis.   Filed Weights   03/17/22 0631 03/17/22 1428  Weight: 71.7 kg (158 lb) 73.3 kg (161 lb 9.6 oz)    Body mass index is 30.53 kg/m.  Estimated Creatinine Clearance: 27.6 mL/min (A) (by C-G formula based on SCr of 1.49 mg/dL (H)).  Patient is candidate for enoxaparin 30mg  every 24 hours based on CrCl <66ml/min or Weight <45kg  DESCRIPTION: Pharmacy has adjusted enoxaparin dose per [facility] policy.  Patient is now receiving enoxaparin 30 mg every 24 hours   Benita Gutter 03/18/2022 7:46 AM

## 2022-03-18 NOTE — Progress Notes (Addendum)
SLP Cancellation Note  Patient Details Name: Sabrina [name] MRN: [id number] DOB: Oct 28, 1940   Cancelled treatment:       Reason Eval/Treat Not Completed: SLP screened, no needs identified, will sign off (chart reviewed; consulted NSG then met w/ pt and family in room for discussion) Pt denied any difficulty swallowing and is currently on a regular diet; tolerates swallowing pills w/ water per NSG. Pt's issue w/ the [facility] swallow screen (reportedly) post a brief oral intubation for a procedure (<24 hours) was that she "knows" she "cannot swallow water well" -- "since childhood". She stated she does not drink water. Both pt and NSG reported she drinks all other drinks including tea, soda, coffee, milk (during this admit at meals) w/ NO difficulty -- No reports of overt s/s of aspiration. Family member present in room agreed.  Encouraged general aspiration precautions including sitting Upright for all oral intake. Pt and family agreed. NSG updated. OF NOTE: Pt conversed in general conversation answering questions w/out expressive/receptive deficits noted; pt denied any speech-language deficits. Speech clear; low volume. No further skilled ST services indicated as pt appears at her baseline. Pt agreed. NSG to reconsult if any change in status while admitted.        Orinda Kenner, MS, CCC-SLP Speech Language Pathologist Rehab Services; [place] [phone] (ascom) Anvith Mauriello 03/18/2022, 1:11 PM

## 2022-03-18 NOTE — Progress Notes (Signed)
*  PRELIMINARY RESULTS* Echocardiogram 2D Echocardiogram has been performed.  Sabrina [name] 03/18/2022, 9:11 AM

## 2022-03-19 ENCOUNTER — Other Ambulatory Visit: Payer: Self-pay

## 2022-03-19 DIAGNOSIS — I70229 Atherosclerosis of native arteries of extremities with rest pain, unspecified extremity: Secondary | ICD-10-CM | POA: Diagnosis not present

## 2022-03-19 DIAGNOSIS — I469 Cardiac arrest, cause unspecified: Secondary | ICD-10-CM | POA: Diagnosis not present

## 2022-03-19 DIAGNOSIS — F419 Anxiety disorder, unspecified: Secondary | ICD-10-CM | POA: Diagnosis not present

## 2022-03-19 DIAGNOSIS — E038 Other specified hypothyroidism: Secondary | ICD-10-CM | POA: Diagnosis not present

## 2022-03-19 LAB — COMPREHENSIVE METABOLIC PANEL
ALT: 40 U/L (ref 0–44)
AST: 68 U/L — ABNORMAL HIGH (ref 15–41)
Albumin: 2.6 g/dL — ABNORMAL LOW (ref 3.5–5.0)
Alkaline Phosphatase: 42 U/L (ref 38–126)
Anion gap: 5 (ref 5–15)
BUN: 16 mg/dL (ref 8–23)
CO2: 23 mmol/L (ref 22–32)
Calcium: 8.2 mg/dL — ABNORMAL LOW (ref 8.9–10.3)
Chloride: 109 mmol/L (ref 98–111)
Creatinine, Ser: 1.42 mg/dL — ABNORMAL HIGH (ref 0.44–1.00)
GFR, Estimated: 37 mL/min — ABNORMAL LOW (ref >60)
Glucose, Bld: 104 mg/dL — ABNORMAL HIGH (ref 70–99)
Potassium: 3.7 mmol/L (ref 3.5–5.1)
Sodium: 137 mmol/L (ref 135–145)
Total Bilirubin: 0.8 mg/dL (ref 0.3–1.2)
Total Protein: 5.8 g/dL — ABNORMAL LOW (ref 6.5–8.1)

## 2022-03-19 LAB — HEPARIN LEVEL (UNFRACTIONATED)
Heparin Unfractionated: 0.39 IU/mL (ref 0.30–0.70)
Heparin Unfractionated: 0.43 IU/mL (ref 0.30–0.70)
Heparin Unfractionated: 0.72 IU/mL — ABNORMAL HIGH (ref 0.30–0.70)

## 2022-03-19 LAB — CBC
HCT: 24.3 % — ABNORMAL LOW (ref 36.0–46.0)
Hemoglobin: 7.9 g/dL — ABNORMAL LOW (ref 12.0–15.0)
MCH: 28.9 pg (ref 26.0–34.0)
MCHC: 32.5 g/dL (ref 30.0–36.0)
MCV: 89 fL (ref 80.0–100.0)
Platelets: 162 10*3/uL (ref 150–400)
RBC: 2.73 MIL/uL — ABNORMAL LOW (ref 3.87–5.11)
RDW: 13.4 % (ref 11.5–15.5)
WBC: 8.3 10*3/uL (ref 4.0–10.5)
nRBC: 0 % (ref 0.0–0.2)

## 2022-03-19 MED ORDER — SPIRONOLACTONE 25 MG PO TABS
25.0000 mg | ORAL_TABLET | Freq: Every day | ORAL | Status: DC
  Administered 2022-03-19 – 2022-03-24 (×6): 25 mg via ORAL
  Filled 2022-03-19 (×6): qty 1

## 2022-03-19 MED ORDER — ASPIRIN 81 MG PO CHEW
81.0000 mg | CHEWABLE_TABLET | Freq: Every day | ORAL | Status: DC
Start: 2022-03-19 — End: 2022-03-24
  Administered 2022-03-19 – 2022-03-24 (×6): 81 mg via ORAL
  Filled 2022-03-19 (×6): qty 1

## 2022-03-19 MED ORDER — HYDROMORPHONE HCL 1 MG/ML IJ SOLN: 0.5000 mg | INTRAMUSCULAR | Status: DC | PRN

## 2022-03-19 MED ORDER — FERROUS SULFATE 325 (65 FE) MG PO TABS
325.0000 mg | ORAL_TABLET | Freq: Every day | ORAL | Status: DC
  Administered 2022-03-20 – 2022-03-24 (×5): 325 mg via ORAL
  Filled 2022-03-19 (×5): qty 1

## 2022-03-19 NOTE — Consult Note (Signed)
ANTICOAGULATION CONSULT NOTE  Pharmacy Consult for IV Heparin Indication: chest pain/ACS  Patient Measurements: Height: 5\' 1"  (154.9 cm) Weight: 73.3 kg (161 lb 9.6 oz) IBW/kg (Calculated) : 47.8 Heparin Dosing Weight: 63.8 kg  Labs: Recent Labs    03/17/22 1406 03/18/22 0007 03/18/22 1004 03/18/22 1900 03/18/22 2303 03/19/22 0449 03/19/22 0706  HGB 9.1* 8.5*  --   --   --  7.9*  --   HCT 27.9* 25.9*  --   --   --  24.3*  --   PLT 213 204  --   --   --  162  --   HEPARINUNFRC  --   --   --   --  0.69  --  0.72*  CREATININE 1.52* 1.49*  --   --   --  1.42*  --   CKTOTAL 189  --   --   --   --   --   --   TROPONINIHS 9  --  16,109* 15,443*  --   --   --      Estimated Creatinine Clearance: 28.9 mL/min (A) (by C-G formula based on SCr of 1.42 mg/dL (H)).   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 b.) TTE 11/16/2021: EF >55%; mild AS (MPG 9 mmHg)   CAD (coronary artery disease)    a.) s/p 3v CABG 03/29/2000   Carotid artery stenosis    a.) s/p LEFT CEA 09/09/2003. b.) Carotid doppler 60/45/4098: 1-19% LICA, CTO RICA; subclavian stenosis   Cataracts, bilateral    Cervical spondylosis without myelopathy    Chronic diastolic CHF (congestive heart failure), NYHA class 3 (HCC)    a.) TTE 05/27/2016: EF >55%, mild LA enlargement, triv PR, mild MR, mod TR; G3DD. b.) TTE 12/12/2017: EF >55%, mild LVH, BAE, mild MR/PR, mod TR; RVSP 52.8 mmHg. c.) TTE 03/18/2020: EF >55%, BAD, AS (MPG 8.7 mmHg); triv MR, mild TR/PR. d.) TTE 11/16/2021: EF >55%, LVH, G1DD, triv  MR, mild PR, mod TR; AS (MPG 9 mmHg); MS (MPG 5 mmHg)   Chronic kidney disease, stage III (moderate) (HCC)    Chronic narcotic use 06/24/2014   Chronic pain syndrome    GERD (gastroesophageal reflux disease)    History of 2019 novel coronavirus disease (COVID-19) 09/22/2021   Hyperlipidemia    Hypertension    Long term current use of antithrombotics/antiplatelets    a.) on daily DAPT therapy (ASA + clopidogrel)   Lumbar stenosis with neurogenic claudication    Mitral stenosis 11/16/2021   a.) TTE 11/16/2021: EF >55%; mod MS (MPG 5 mmHg)   Osteoarthritis of hip    Pulmonary hypertension ([place]) 12/12/2017   a.) TTE 12/12/2017: mild; RVSP 52.8 mmHg   PVD (peripheral vascular disease) (HCC)    Renal artery stenosis (HCC)    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    Medications:  No anticoagulation prior to admission per my chart review  Assessment: Patient is an 81 y/o F with medical history as above and including CAD s/p CABG, PVD, carotid artery stenosis, renal artery stenosis who underwent elective bilateral common femoral, superficial femoral and profunda femoris endarterectomy 5/24. Post-operatively, patient went unresponsive requiring CPR x 2 minutes. Troponin today [id number]. Pharmacy consulted to manage heparin infusion for suspected ACS.  CBC with acute on chronic anemia (Hgb 8.5 with BL 11).    Goal of Therapy:  Heparin level 0.3-0.7 units/ml Monitor platelets by anticoagulation protocol: Yes   Plan:  --heparin level is therapeutic x 1 --continue heparin infusion at 700 units/hr --Re-check heparin level in 8 hours to confirm --Daily CBC per protocol while on IV heparin  Dallie Piles 03/19/2022,3:21 PM

## 2022-03-19 NOTE — Plan of Care (Signed)
Continuing with plan of care. 

## 2022-03-19 NOTE — Consult Note (Signed)
ANTICOAGULATION CONSULT NOTE  Pharmacy Consult for IV Heparin Indication: chest pain/ACS  Patient Measurements: Height: 5\' 1"  (154.9 cm) Weight: 73.3 kg (161 lb 9.6 oz) IBW/kg (Calculated) : 47.8 Heparin Dosing Weight: 63.8 kg  Labs: Recent Labs    03/17/22 1406 03/18/22 0007 03/18/22 1004 03/18/22 1900 03/18/22 2303 03/19/22 0449 03/19/22 0706  HGB 9.1* 8.5*  --   --   --  7.9*  --   HCT 27.9* 25.9*  --   --   --  24.3*  --   PLT 213 204  --   --   --  162  --   HEPARINUNFRC  --   --   --   --  0.69  --  0.72*  CREATININE 1.52* 1.49*  --   --   --  1.42*  --   CKTOTAL 189  --   --   --   --   --   --   TROPONINIHS 9  --  92,119* 15,443*  --   --   --      Estimated Creatinine Clearance: 28.9 mL/min (A) (by C-G formula based on SCr of 1.42 mg/dL (H)).   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 b.) TTE 11/16/2021: EF >55%; mild AS (MPG 9 mmHg)   CAD (coronary artery disease)    a.) s/p 3v CABG 03/29/2000   Carotid artery stenosis    a.) s/p LEFT CEA 09/09/2003. b.) Carotid doppler 41/74/0814: 4-81% LICA, CTO RICA; subclavian stenosis   Cataracts, bilateral    Cervical spondylosis without myelopathy    Chronic diastolic CHF (congestive heart failure), NYHA class 3 (HCC)    a.) TTE 05/27/2016: EF >55%, mild LA enlargement, triv PR, mild MR, mod TR; G3DD. b.) TTE 12/12/2017: EF >55%, mild LVH, BAE, mild MR/PR, mod TR; RVSP 52.8 mmHg. c.) TTE 03/18/2020: EF >55%, BAD, AS (MPG 8.7 mmHg); triv MR, mild TR/PR. d.) TTE 11/16/2021: EF >55%, LVH, G1DD, triv  MR, mild PR, mod TR; AS (MPG 9 mmHg); MS (MPG 5 mmHg)   Chronic kidney disease, stage III (moderate) (HCC)    Chronic narcotic use 06/24/2014   Chronic pain syndrome    GERD (gastroesophageal reflux disease)    History of 2019 novel coronavirus disease (COVID-19) 09/22/2021   Hyperlipidemia    Hypertension    Long term current use of antithrombotics/antiplatelets    a.) on daily DAPT therapy (ASA + clopidogrel)   Lumbar stenosis with neurogenic claudication    Mitral stenosis 11/16/2021   a.) TTE 11/16/2021: EF >55%; mod MS (MPG 5 mmHg)   Osteoarthritis of hip    Pulmonary hypertension ([place]) 12/12/2017   a.) TTE 12/12/2017: mild; RVSP 52.8 mmHg   PVD (peripheral vascular disease) (HCC)    Renal artery stenosis (HCC)    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    Medications:  No anticoagulation prior to admission per my chart review  Assessment: Patient is an 81 y/o F with medical history as above and including CAD s/p CABG, PVD, carotid artery stenosis, renal artery stenosis who underwent elective bilateral common femoral, superficial femoral and profunda femoris endarterectomy 5/24. Post-operatively, patient went unresponsive requiring CPR x 2 minutes. Troponin today [id number]. Pharmacy consulted to manage heparin infusion for suspected ACS.  CBC with acute on chronic anemia (Hgb 8.5 with BL 11).    Goal of Therapy:  Heparin level 0.3-0.7 units/ml Monitor platelets by anticoagulation protocol: Yes   Plan:  --HL is slightly supratherapeutic --Decrease heparin infusion to 700 units/hr --Re-check HL 8 hours after rate change --Daily CBC per protocol while on IV heparin  Benita Gutter 03/19/2022,8:02 AM

## 2022-03-19 NOTE — Evaluation (Signed)
Occupational Therapy Evaluation Patient Details Name: Sabrina [name] MRN: [id number] DOB: 06-Apr-1941 Today's Date: 03/19/2022   History of Present Illness Pt is an 81 y/o F admitted on 03/17/22 after presenting to hospital for BLE endarterectomy for tx of severe PAD & claudication. Post procedure pt experienced acute cardiac arrest & placed on MV support, extubated on 5/24.  PMH: PAD, HTN, HLD, PVD, CAD s/p CABG, aortic stenosis, pulmonary HTN, GAD, chronic hip pain, GERD, hypothyroidism, CKD3   Clinical Impression   Sabrina [name] was seen for OT/PT co-evaluation this date. Prior to hospital admission, pt was Independent form obility and ADLs. Pt lives alone however daughter visiting from out of state to care for her mother ~1 week. Pt presents to acute OT demonstrating impaired ADL performance and functional mobility 2/2 decreased activity tolerance and functional strength/ROM/balance deficits. Upon arrival pt seated EOB with PT. Pt currently requires MOD A x2 HHA for simulated BSC t/f. MAX A don B socks in sitting. SETUP + MIN cues hand/face washing in sitting with washcloths - cues to sequence. Pt would benefit from skilled OT to address noted impairments and functional limitations (see below for any additional details). Upon hospital discharge, recommend STR to maximize pt safety and return to PLOF.    Recommendations for follow up therapy are one component of a multi-disciplinary discharge planning process, led by the attending physician.  Recommendations may be updated based on patient status, additional functional criteria and insurance authorization.   Follow Up Recommendations  Skilled nursing-short term rehab (<3 hours/day)    Assistance Recommended at Discharge Frequent or constant Supervision/Assistance  Patient can return home with the following Two people to help with walking and/or transfers;Two people to help with bathing/dressing/bathroom;Help with stairs or ramp for entrance     Functional Status Assessment  Patient has had a recent decline in their functional status and demonstrates the ability to make significant improvements in function in a reasonable and predictable amount of time.  Equipment Recommendations  BSC/3in1    Recommendations for Other Services       Precautions / Restrictions Precautions Precautions: Fall Restrictions Weight Bearing Restrictions: No Other Position/Activity Restrictions: pt with BLE groin wound vacs      Mobility Bed Mobility               General bed mobility comments: sitting on arrival and end of session    Transfers Overall transfer level: Needs assistance Equipment used: 2 person hand held assist Transfers: Sit to/from Stand, Bed to chair/wheelchair/BSC Sit to Stand: Mod assist, +2 physical assistance, From elevated surface     Step pivot transfers: Mod assist, +2 physical assistance, +2 safety/equipment            Balance Overall balance assessment: Needs assistance Sitting-balance support: Feet unsupported, Bilateral upper extremity supported Sitting balance-Leahy Scale: Poor   Postural control: Right lateral lean, Posterior lean Standing balance support: Bilateral upper extremity supported, During functional activity Standing balance-Leahy Scale: Poor                             ADL either performed or assessed with clinical judgement   ADL Overall ADL's : Needs assistance/impaired                                       General ADL Comments: MOD A x2 HHA for simulated BSC  t/f. MAX A don B socks in sitting. SETUP + MIN cues hand/face washing in sitting with washcloths - cues to sequence.      Pertinent Vitals/Pain Pain Assessment Pain Assessment: Faces Faces Pain Scale: Hurts even more Pain Location: lower belly & BLE groin Pain Descriptors / Indicators: Discomfort Pain Intervention(s): Limited activity within patient's tolerance, Repositioned, Patient  requesting pain meds-RN notified     Hand Dominance     Extremity/Trunk Assessment Upper Extremity Assessment Upper Extremity Assessment: Generalized weakness   Lower Extremity Assessment Lower Extremity Assessment: Generalized weakness       Communication Communication Communication: No difficulties   Cognition Arousal/Alertness: Awake/alert Behavior During Therapy: WFL for tasks assessed/performed Overall Cognitive Status: Within Functional Limits for tasks assessed                                       General Comments  Pt c/o slight dizziness after transferring to recliner, BP in RUE 122/58 mmHg MAP 72            Home Living Family/patient expects to be discharged to:: Private residence Living Arrangements: Alone Available Help at Discharge: Family;Available 24 hours/day (family visiting from [place] & can provide assistance upon d/c, they're hoping for pt to avoid STR) Type of Home: House Home Access: Level entry     Home Layout: One level               Home Equipment: Conservation officer, nature (2 wheels);Cane - single point          Prior Functioning/Environment Prior Level of Function : Independent/Modified Independent;Driving             Mobility Comments: independent without AD, denies falls, driving ADLs Comments: independent with cooking, cleaning, bathing, dressing        OT Problem List: Decreased range of motion;Decreased strength;Decreased activity tolerance;Impaired balance (sitting and/or standing);Decreased safety awareness      OT Treatment/Interventions: Self-care/ADL training;Therapeutic exercise;Energy conservation;DME and/or AE instruction;Therapeutic activities;Patient/family education;Balance training    OT Goals(Current goals can be found in the care plan section) Acute Rehab OT Goals Patient Stated Goal: to go home OT Goal Formulation: With patient/family Time For Goal Achievement: 04/02/22 Potential to Achieve  Goals: Good ADL Goals Pt Will Perform Grooming: with set-up;with supervision;sitting Pt Will Perform Lower Body Dressing: with min assist;sit to/from stand Pt Will Transfer to Toilet: with min assist;ambulating;bedside commode  OT Frequency: Min 3X/week    Co-evaluation PT/OT/SLP Co-Evaluation/Treatment: Yes Reason for Co-Treatment: For patient/therapist safety;To address functional/ADL transfers PT goals addressed during session: Mobility/safety with mobility OT goals addressed during session: ADL's and self-care      AM-PAC OT "6 Clicks" Daily Activity     Outcome Measure Help from another person eating meals?: A Little Help from another person taking care of personal grooming?: A Little Help from another person toileting, which includes using toliet, bedpan, or urinal?: A Lot Help from another person bathing (including washing, rinsing, drying)?: A Lot Help from another person to put on and taking off regular upper body clothing?: A Little Help from another person to put on and taking off regular lower body clothing?: A Lot 6 Click Score: 15   End of Session Equipment Utilized During Treatment: Rolling walker (2 wheels) Nurse Communication: Mobility status  Activity Tolerance: Patient tolerated treatment well;Patient limited by fatigue Patient left: in chair;with call bell/phone within reach;with family/visitor present  OT Visit  Diagnosis: Unsteadiness on feet (R26.81)                Time: 9093-1121 OT Time Calculation (min): 16 min Charges:  OT Evaluation $OT Eval Moderate Complexity: 1 Mod  Dessie Coma, M.S. OTR/L  03/19/22, 1:39 PM  ascom [phone]

## 2022-03-19 NOTE — Progress Notes (Signed)
PROGRESS NOTE  Arlette A Monforte    DOB: [birth year], 81 y.o.  HRC:[id number]    Code Status: Full Code   DOA: 03/17/2022   LOS: 2   Brief hospital course  Jean A Leis is a 81 y.o. female with a PMH significant for PAD, HTN, HLD, PVD, CAD s/p CABG, aortic stenosis, pulmonary HTN, GAD, chronic hip pain, GERD, hypothyroidism, CKD III.  They presented from home to the [facility] on 03/17/2022 for bilateral lower extremity endarterectomy in treatment of severe PAD and claudication.  5/24 b/l femoral endarterectomy and stenting 5/24 post procedure, experienced acute cardiac arrest, placed on MV support 5/24 extubated 5/25 identified ACS, started on heparin gtt  03/19/22 -stable  Assessment & Plan  Principal Problem:   Atherosclerosis of artery of extremity with rest pain [facility]) Active Problems:   Cardiac arrest ([place])   Respiratory arrest ([place])   Other specified hypothyroidism   Anxiety and depression  Severe ACUTE Hypoxic and Hypercapnic Respiratory Failure- resolved. s/p intubation. She is stable ORA without respiratory symptoms   CARDIAC FAILURE-NSTEMI/CARDIAC ARREST- stable. Troponins elevated to 15,682. She is endorsing significant pain of chest wall - cardiology consulted, appreciate recs - continue heparin gtt   ACUTE KIDNEY INJURY on CKDIII- appears to be close to baseline. Cr 1.49>1.42 today - BMP am  PAD- s/p endarterectomy b/l LE - vascular surgery consulted, appreciate recs  CAD s/p CABG  HLD  HTN- poorly controlled HTN on home medications.  - adding on spironolactone - continue ASA, plavix, zetia - continue home antihypertensives  Hypothyroidism-  - continue home levothyroxine  Anxiety/depression- chronic - continue home medications  Body mass index is 30.53 kg/m.  VTE ppx: TED hose Start: 03/17/22 1428   Diet:     Diet   Diet regular Room service appropriate? Yes; Fluid consistency: Thin   Consultants: Cardiology CCM Vascular  surgery Subjective 03/19/22    Pt reports feeling sore all over. She endorses stiffness in her bilateral thigh/groin area.    Objective   Vitals:   03/19/22 0200 03/19/22 0320 03/19/22 0400 03/19/22 0600  BP: (!) 129/48  (!) 153/62 (!) 172/69  Pulse: 74  84 90  Resp: (!) 23  (!) 22 (!) 24  Temp:  100.3 F (37.9 C)    TempSrc:  Oral    SpO2: 97%  96% 96%  Weight:      Height:        Intake/Output Summary (Last 24 hours) at 03/19/2022 0816 Last data filed at 03/19/2022 0600 Gross per 24 hour  Intake 616.34 ml  Output 1345 ml  Net -728.66 ml    Filed Weights   03/17/22 0631 03/17/22 1428  Weight: 71.7 kg 73.3 kg     Physical Exam:  General: awake, alert, NAD HEENT: atraumatic, clear conjunctiva, anicteric sclera, MMM, hearing grossly normal Respiratory: normal respiratory effort. Cardiovascular: quick capillary refill, normal S1/S2, RRR, no JVD, murmurs Gastrointestinal: soft, NT, ND Nervous: A&O x3. no gross focal neurologic deficits, normal speech Extremities: moves all equally, no edema, normal tone Skin: dry, intact, normal temperature, normal color. No rashes, lesions or ulcers on exposed skin. Wound vacs bilateral groins intact, no drainage Psychiatry: anxious mood, congruent affect  Labs   I have personally reviewed the following labs and imaging studies CBC    Component Value Date/Time   WBC 8.3 03/19/2022 0449   RBC 2.73 (L) 03/19/2022 0449   HGB 7.9 (L) 03/19/2022 0449   HGB 9.2 (L) 04/13/2014 0952   HCT 24.3 (L)  03/19/2022 0449   HCT 24.7 (L) 04/12/2014 1143   PLT 162 03/19/2022 0449   PLT 182 04/11/2014 0558   MCV 89.0 03/19/2022 0449   MCV 85 03/27/2014 0944   MCH 28.9 03/19/2022 0449   MCHC 32.5 03/19/2022 0449   RDW 13.4 03/19/2022 0449   RDW 13.7 03/27/2014 0944   LYMPHSABS 1.2 03/17/2022 1406   MONOABS 0.1 03/17/2022 1406   EOSABS 0.0 03/17/2022 1406   BASOSABS 0.0 03/17/2022 1406      Latest Ref Rng & Units 03/19/2022    4:49 AM  03/18/2022   12:07 AM 03/17/2022    2:06 PM  BMP  Glucose 70 - 99 mg/dL 104   118   236    BUN 8 - 23 mg/dL 16   18   20     Creatinine 0.44 - 1.00 mg/dL 1.42   1.49   1.52    Sodium 135 - 145 mmol/L 137   136   134    Potassium 3.5 - 5.1 mmol/L 3.7   3.9   5.4    Chloride 98 - 111 mmol/L 109   108   106    CO2 22 - 32 mmol/L 23   24   21     Calcium 8.9 - 10.3 mg/dL 8.2   8.0   8.1      DG C-Arm 1-60 Min-No Report  Result Date: 03/17/2022 Fluoroscopy was utilized by the requesting physician.  No radiographic interpretation.   ECHOCARDIOGRAM COMPLETE  Result Date: 03/18/2022    ECHOCARDIOGRAM REPORT   Patient Name:   Brynlee A [facility] Date of Exam: 03/18/2022 Medical Rec #:  [id number]          Height:       61.0 in Accession #:    [id number]         Weight:       161.6 lb Date of Birth:  September 15, 1941          BSA:          1.725 m Patient Age:    16 years           BP:           133/64 mmHg Patient Gender: F                  HR:           73 bpm. Exam Location:  [facility] Procedure: 2D Echo, Cardiac Doppler and Color Doppler Indications:     Cardiac arrest I46.9  History:         Patient has no prior history of Echocardiogram examinations.                  CHF, CAD, Prior CABG; Risk Factors:Hypertension.  Sonographer:     Sherrie Sport Referring Phys:  [id number] Flora Lipps Diagnosing Phys: Donnelly Angelica  Sonographer Comments: Technically challenging study due to limited acoustic windows, no apical window and no subcostal window. IMPRESSIONS  1. Left ventricular ejection fraction, by estimation, is 60 to 65%. The left ventricle has normal function. The left ventricle has no regional wall motion abnormalities. There is moderate left ventricular hypertrophy. Left ventricular diastolic function  could not be evaluated.  2. The mitral valve is abnormal. Trivial mitral valve regurgitation. Moderate mitral annular calcification.  3. The aortic valve is calcified. Aortic valve regurgitation is not visualized.  Conclusion(s)/Recommendation(s): Very limited study. Normal LV function. Unable to assess for mitral stensos which previously was  moderate to severe. Unable to assess for AS, and no TR velocity obtained to assess RVSP. FINDINGS  Left Ventricle: Left ventricular ejection fraction, by estimation, is 60 to 65%. The left ventricle has normal function. The left ventricle has no regional wall motion abnormalities. The left ventricular internal cavity size was normal in size. There is  moderate left ventricular hypertrophy. Left ventricular diastolic function could not be evaluated. Pericardium: There is no evidence of pericardial effusion. Mitral Valve: The mitral valve is abnormal. Moderate mitral annular calcification. Trivial mitral valve regurgitation. Tricuspid Valve: The tricuspid valve is normal in structure. Tricuspid valve regurgitation is mild. Aortic Valve: The aortic valve is calcified. Aortic valve regurgitation is not visualized. Pulmonic Valve: The pulmonic valve was not well visualized. Pulmonic valve regurgitation is not visualized. No evidence of pulmonic stenosis. Aorta: The aortic root is normal in size and structure.  LEFT VENTRICLE PLAX 2D LVIDd:         3.80 cm LVIDs:         2.10 cm LV PW:         1.30 cm LV IVS:        1.30 cm LVOT diam:     2.00 cm LVOT Area:     3.14 cm  LEFT ATRIUM         Index LA diam:    3.90 cm 2.26 cm/m                        PULMONIC VALVE AORTA                 PV Vmax:        0.85 m/s Ao Root diam: 2.90 cm PV Vmean:       48.900 cm/s                       PV VTI:         0.124 m                       PV Peak grad:   2.9 mmHg                       PV Mean grad:   1.0 mmHg                       RVOT Peak grad: 6 mmHg   SHUNTS Systemic Diam: 2.00 cm Pulmonic VTI:  0.241 m Donnelly Angelica Electronically signed by Donnelly Angelica Signature Date/Time: 03/18/2022/10:44:10 AM    Final     Disposition Plan & Communication  Patient status: Inpatient  Admitted From: Home Planned  disposition location: Home Anticipated discharge date: 5/27 pending cardiology clearance. PT/OT evaluation tomorrow may reveal that she has deconditioned and needs SNF at dc  Family Communication: none    Author: Richarda Osmond, DO [facility] 03/19/2022, 8:16 AM   Available by Epic secure chat 7AM-7PM. If 7PM-7AM, please contact night-coverage.  [facility] contact information found on [email or web address].

## 2022-03-19 NOTE — Progress Notes (Signed)
Report given to [facility], receiving nurse on 2A, patient is in stable condition.

## 2022-03-19 NOTE — Plan of Care (Signed)
Patient continues on heparin infusion; heparin level therapeutic, and follow up draw scheduled for am. Wound vac to bilateral groin sites, and CDI. BP elevated earlier in shift, and responded well to scheduled antihypertensives. Medicated x 1 for back pain with Percocet 1 tablet, and pt also repositioned for comfort. Pt very weak, required assistance for positioning. Pt appeared to rest well overnight thus far after pain med.  Problem: Education: Goal: Knowledge of General Education information will improve Description: Including pain rating scale, medication(s)/side effects and non-pharmacologic comfort measures Outcome: Progressing   Problem: Health Behavior/Discharge Planning: Goal: Ability to manage health-related needs will improve Outcome: Progressing   Problem: Clinical Measurements: Goal: Ability to maintain clinical measurements within normal limits will improve Outcome: Progressing Goal: Will remain free from infection Outcome: Progressing Goal: Diagnostic test results will improve Outcome: Progressing Goal: Respiratory complications will improve Outcome: Progressing Goal: Cardiovascular complication will be avoided Outcome: Progressing   Problem: Activity: Goal: Risk for activity intolerance will decrease Outcome: Progressing   Problem: Nutrition: Goal: Adequate nutrition will be maintained Outcome: Progressing   Problem: Coping: Goal: Level of anxiety will decrease Outcome: Progressing   Problem: Elimination: Goal: Will not experience complications related to bowel motility Outcome: Progressing Goal: Will not experience complications related to urinary retention Outcome: Progressing   Problem: Pain Managment: Goal: General experience of comfort will improve Outcome: Progressing   Problem: Safety: Goal: Ability to remain free from injury will improve Outcome: Progressing   Problem: Skin Integrity: Goal: Risk for impaired skin integrity will  decrease Outcome: Progressing

## 2022-03-19 NOTE — Progress Notes (Signed)
[place] CARDIOLOGY CONSULT NOTE       Patient ID: Sabrina [name] MRN: [id number] DOB/AGE: 04-04-1941 81 y.o.  Admit date: 03/17/2022 Referring Physician Dr. Flora Lipps Primary Physician Dr. Deborra Medina Primary Cardiologist Dr. Lujean Amel Reason for Consultation cardiopulmonary arrest s/p resuscitation   HPI: Sabrina [name] is an 81yoF with a PMH of CAD s/p CABG x3 with LIMA-LAD, SVG-distal RCA and SVG-ramus intermedius 03/2000 with recent heart cath 11/18/2021 showing extensive native CAD and 100% occlusion SVG-RCA and 90%, distal SVG-OM with significant stenosis of her left subclavian artery, s/p left subclavian stenting on 11/23/2021, HFpEF (LVEF >55%, G1 DD mild-mild AS, mod-severe MS 10/2021), peripheral arterial disease, CKD 3, hypertension, hyperlipidemia, GERD, lumbar stenosis, chronic pain who presented to [facility] on [facility]/17/2022 for elective bilateral superior femoral artery and left iliac stenting with vascular surgery.  Cardiology is consulted for cardiopulmonary arrest post procedurally. Found to have an markedly elevated troponin at 15000, suggesting a perioperative MI.   Interval History:  -feels weak and has leg pain/stiffness bilaterally -chest wall pain from CPR -troponin flat -ongoing discussions regarding appropriate timing of heart cath.   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 b.) TTE 11/16/2021: EF >55%; mild AS (MPG 9 mmHg)   CAD (coronary artery disease)    a.) s/p 3v  CABG 03/29/2000   Carotid artery stenosis    a.) s/p LEFT CEA 09/09/2003. b.) Carotid doppler 11/94/1740: 8-14% LICA, CTO RICA; subclavian stenosis   Cataracts, bilateral    Cervical spondylosis without myelopathy    Chronic diastolic CHF (congestive heart failure), NYHA class 3 (HCC)    a.) TTE 05/27/2016: EF >55%, mild LA enlargement, triv PR, mild MR, mod TR; G3DD. b.) TTE 12/12/2017: EF >55%, mild LVH, BAE, mild MR/PR, mod TR; RVSP 52.8 mmHg. c.) TTE 03/18/2020: EF >55%, BAD, AS (MPG 8.7 mmHg); triv MR, mild TR/PR. d.) TTE 11/16/2021: EF >55%, LVH, G1DD, triv MR, mild PR, mod TR; AS (MPG 9 mmHg); MS (MPG 5 mmHg)   Chronic kidney disease, stage III (moderate) (HCC)    Chronic narcotic use 06/24/2014   Chronic pain syndrome    GERD (gastroesophageal reflux disease)    History of 2019 novel coronavirus disease (COVID-19) 09/22/2021   Hyperlipidemia    Hypertension    Long term current use of antithrombotics/antiplatelets    a.) on daily DAPT therapy (ASA + clopidogrel)   Lumbar stenosis with neurogenic claudication    Mitral stenosis 11/16/2021   a.) TTE 11/16/2021: EF >55%; mod MS (MPG 5 mmHg)   Osteoarthritis of hip    Pulmonary hypertension ([place]) 12/12/2017   a.) TTE 12/12/2017: mild; RVSP 52.8 mmHg   PVD (peripheral vascular disease) (HCC)    Renal artery stenosis (HCC)    S/P CABG x 3 03/29/2000   a.) 3v CABG: LIMA-LAD, SVG-dRCA, SVG-RI   Secondary hyperparathyroidism (HCC)    SOB (shortness of breath)    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    Past Surgical History:  Procedure Laterality Date   ABDOMINAL HYSTERECTOMY  1976   CAROTID ARTERY ANGIOPLASTY Left    CAROTID ENDARTERECTOMY Left 09/09/2003   Procedure: CAROTID ENDARTERECTOMY; Location: [facility]; Surgeon: Maura Crandall, MD   COLONOSCOPY WITH PROPOFOL N/A 08/16/2017   Procedure: COLONOSCOPY WITH PROPOFOL;  Surgeon: [name], MD;   Location: [facility] ENDOSCOPY;  Service: Endoscopy;  Laterality: N/A;   CORONARY ANGIOPLASTY WITH STENT PLACEMENT  2000   CORONARY ARTERY BYPASS GRAFT N/A 03/29/2000   Procedure: 3v CORONARY ARTERY BYPASS GRAFT; Location: [facility]   CYSTOSCOPY WITH STENT PLACEMENT Bilateral    ENDARTERECTOMY FEMORAL Bilateral 03/17/2022   Procedure: ENDARTERECTOMY FEMORAL ( BILATERAL SFA STENT);  Surgeon: Katha Cabal, MD;  Location: [facility] ORS;  Service: Vascular;  Laterality: Bilateral;   ESOPHAGOGASTRODUODENOSCOPY (EGD) WITH PROPOFOL N/A 08/16/2017   Procedure: ESOPHAGOGASTRODUODENOSCOPY (EGD) WITH PROPOFOL;  Surgeon: [name], MD;  Location: [facility]COPY;  Service: Endoscopy;  Laterality: N/A;   ESOPHAGOGASTRODUODENOSCOPY (EGD) WITH PROPOFOL N/A 06/29/2018   Procedure: ESOPHAGOGASTRODUODENOSCOPY (EGD) WITH PROPOFOL;  Surgeon: Virgel Manifold, MD;  Location: [facility] ENDOSCOPY;  Service: Endoscopy;  Laterality: N/A;   INSERTION OF ILIAC STENT Left 03/17/2022   Procedure: INSERTION OF ILIAC STENT;  Surgeon: Katha Cabal, MD;  Location: [facility] ORS;  Service: Vascular;  Laterality: Left;   LAPAROSCOPIC CHOLECYSTECTOMY Left 10/26/1999   Procedure: LAPAROSCOPIC CHOLECYSTECTOMY; Location: [facility]; Surgeon: Rochel Brome, MD   LEFT HEART CATH AND CORONARY ANGIOGRAPHY N/A 11/18/2021   Procedure: LEFT HEART CATH AND CORONARY ANGIOGRAPHY;  Surgeon: Yolonda Kida, MD;  Location: [place] [facility];  Service: Cardiovascular;  Laterality: N/A;   LEFT HEART CATH AND CORS/GRAFTS ANGIOGRAPHY Left 11/19/2002   Procedure: LEFT HEART CATH AND CORS/GRAFTS ANGIOGRAPHY; Location: [place]; Surgeon: Katrine Coho, MD   LEFT HEART CATH AND CORS/GRAFTS ANGIOGRAPHY Left 09/17/2003   Procedure: LEFT HEART CATH AND CORS/GRAFTS ANGIOGRAPHY; Location: [place]; Surgeon: Katrine Coho, MD   LOWER EXTREMITY ANGIOGRAPHY Right 01/06/2022   Procedure: Lower Extremity Angiography;  Surgeon: Katha Cabal, MD;  Location: [place]  CV [facility];  Service: Cardiovascular;  Laterality: Right;   RENAL ARTERY ANGIOPLASTY Bilateral 12/2013   TOE AMPUTATION Right    small toe   TONSILLECTOMY AND ADENOIDECTOMY     TOTAL HIP ARTHROPLASTY Left 2005   TOTAL HIP ARTHROPLASTY Right 2015   UPPER EXTREMITY ANGIOGRAPHY Left 11/23/2021   Procedure: UPPER EXTREMITY ANGIOGRAPHY;  Surgeon: Algernon Huxley, MD;  Location: [place] [facility];  Service: Cardiovascular;  Laterality: Left;    Medications Prior to Admission  Medication Sig Dispense Refill Last Dose   Ascorbic Acid (VITAMIN C) 500 MG CAPS Take 500 mg by mouth daily. 30 capsule 3 03/16/2022   aspirin 81 MG tablet Take 81 mg by mouth daily.   03/16/2022   butalbital-acetaminophen-caffeine (FIORICET) 50-325-40 MG tablet Take 1 tablet by mouth every 4 (four) hours as needed for headache. 120 tablet 1 03/16/2022   docusate sodium (COLACE) 100 MG capsule Take 1 capsule (100 mg total) by mouth daily as needed for mild constipation or moderate constipation. 30 capsule 3 03/16/2022   ezetimibe (ZETIA) 10 MG tablet Take 1 tablet (10 mg total) by mouth daily. 90 tablet 1 03/17/2022   ferrous sulfate 325 (65 FE) MG EC tablet Take 1 tablet (325 mg total) by mouth 2 (two) times daily with a meal. 60 tablet 3 03/16/2022   hydrALAZINE (APRESOLINE) 100 MG tablet Take 1 tablet (  100 mg total) by mouth 3 (three) times daily. Marland KitchenNOTE DOSE INCREASE TO 100 MG. KEEP ON FILE FOR FUTURE REFILLS 270 tablet 1 03/17/2022   labetalol (NORMODYNE) 300 MG tablet Take 1 tablet (300 mg total) by mouth 2 (two) times daily. For hypertension 180 tablet 1 03/17/2022   nortriptyline (PAMELOR) 10 MG capsule Take 1 capsule (10 mg total) by mouth 2 (two) times daily. For chronic headaches 180 capsule 1 03/17/2022   Omega 3 1000 MG CAPS Take 1,000 mg by mouth 2 (two) times daily.   03/16/2022   pantoprazole (PROTONIX) 40 MG tablet Take 1 tablet (40 mg total) by mouth daily. 30 tablet 5 03/17/2022   ranolazine (RANEXA) 500 MG 12 hr tablet Take  1 tablet (500 mg total) by mouth 2 (two) times daily. 60 tablet 3 03/17/2022   traMADol (ULTRAM) 50 MG tablet Take 1 tablet (50 mg total) by mouth every 6 (six) hours as needed. 120 tablet 5 03/16/2022   valsartan (DIOVAN) 320 MG tablet Take 1 tablet (320 mg total) by mouth daily. 90 tablet 0 03/16/2022   clopidogrel (PLAVIX) 75 MG tablet Take 1 tablet (75 mg total) by mouth daily. To prevent strokes 90 tablet 1 03/11/22   Social History   Socioeconomic History   Marital status: Divorced    Spouse name: Not on file   Number of children: 3   Years of education: Not on file   Highest education level: Not on file  Occupational History   Occupation: retired  Tobacco Use   Smoking status: Former    Types: Cigarettes    Quit date: 10/25/1998    Years since quitting: 23.4   Smokeless tobacco: Never  Vaping Use   Vaping Use: Never used  Substance and Sexual Activity   Alcohol use: No    Alcohol/week: 0.0 standard drinks   Drug use: Never   Sexual activity: Not Currently  Other Topics Concern   Not on file  Social History Narrative   Daughter [name] ([place]); 1 in [employer]; 1 in [place]   Lives alone   Social Determinants of Health   Financial Resource Strain: Not on file  Food Insecurity: Not on file  Transportation Needs: Not on file  Physical Activity: Not on file  Stress: Not on file  Social Connections: Not on file  Intimate Partner Violence: Not on file    Family History  Problem Relation Age of Onset   Stroke Mother    Hypertension Mother    Diabetes Mother    Hypertension Father    Heart disease Sister        MI   Multiple sclerosis Daughter    Multiple sclerosis Son    Cerebral aneurysm Son    Seizures Son    Cerebral aneurysm Son    Breast cancer Paternal Aunt 69      PHYSICAL EXAM General: Pleasant elderly black female, well nourished, in no acute distress.  Laying in ICU bed after transfer from recliner.  HEENT:  Normocephalic and atraumatic. Neck:  No  JVD.  Lungs: Normal respiratory effort on room air. Clear bilaterally to auscultation. No wheezes, crackles, rhonchi.  Heart: HRRR . Normal S1 and S2 without gallops or murmurs. Radial & DP pulses 2+ bilaterally. Chest: Tenderness to palpation of her anterior chest wall Abdomen: Non-distended appearing.  Msk: Normal strength and tone for age. Extremities: Warm and well perfused. No clubbing, cyanosis. No peripheral edema. Wound vacs at bilateral groins Neuro: Alert and oriented X 3.  Psych:  Anxious mood. Answers questions appropriately.   Labs:   Lab Results  Component Value Date   WBC 8.3 03/19/2022   HGB 7.9 (L) 03/19/2022   HCT 24.3 (L) 03/19/2022   MCV 89.0 03/19/2022   PLT 162 03/19/2022    Recent Labs  Lab 03/19/22 0449  NA 137  K 3.7  CL 109  CO2 23  BUN 16  CREATININE 1.42*  CALCIUM 8.2*  PROT 5.8*  BILITOT 0.8  ALKPHOS 42  ALT 40  AST 68*  GLUCOSE 104*    Lab Results  Component Value Date   CKTOTAL 189 03/17/2022   CKMB 1.8 01/30/2012   TROPONINI 0.04 01/30/2012     Lab Results  Component Value Date   CHOL 206 (H) 01/28/2022   CHOL 205 (H) 07/29/2020   CHOL 167 02/22/2018   Lab Results  Component Value Date   HDL 68 01/28/2022   HDL 49.10 07/29/2020   HDL 54.80 02/22/2018   Lab Results  Component Value Date   LDLCALC 123 (H) 01/28/2022   LDLCALC 130 (H) 07/29/2020   LDLCALC 102 (H) 02/22/2018   Lab Results  Component Value Date   TRIG 61 01/28/2022   TRIG 131.0 07/29/2020   TRIG 53.0 02/22/2018   Lab Results  Component Value Date   CHOLHDL 3.0 01/28/2022   CHOLHDL 4 07/29/2020   CHOLHDL 3 02/22/2018   Lab Results  Component Value Date   LDLDIRECT 109.0 03/05/2016      Radiology: MR Angiogram Head Wo Contrast  Result Date: 03/03/2022 CLINICAL DATA:  Follow-up examination for suspected ICA aneurysm. EXAM: MRA HEAD WITHOUT CONTRAST TECHNIQUE: Angiographic images of the Circle of Willis were acquired using MRA technique without  intravenous contrast. COMPARISON:  Comparison made with recent brain MRI from 02/18/2022. FINDINGS: Anterior circulation: Visualized cervical left ICA widely patent with antegrade flow. Horizontal petrous left ICA widely patent as well. Atheromatous irregularity throughout the left carotid siphon without high-grade stenosis. 2.5 mm saccular outpouching arising from the cavernous left ICA consistent with a small aneurysm (series 5, image 98). This is directed medially. Additional lobulated irregular aneurysm measuring up to 3.8 mm extending posteriorly from the supraclinoid left ICA consistent with a left posterior communicating artery aneurysm (series 5, image 119). This accounts for the abnormality seen on prior brain MRI. The cervical right ICA is largely occluded with only intermittent and severely attenuated flow seen through the petrous segment. Distal reconstitution at the supraclinoid right ICA via collateral flow across the circle-of-Willis and/or the of the left posterior communicating artery. A1 segments patent. Right A1 hypoplastic. Normal anterior communicating artery complex. Both ACAs patent to their distal aspects without stenosis. Both M1 segments patent without stenosis. Normal right MCA bifurcation. There is a 2.4 mm saccular aneurysm arising from the left MCA bifurcation (series 5, image 143). This is directed medially. Distal MCA branches well perfused bilaterally. Posterior circulation: Right vertebral artery dominant and patent to the vertebrobasilar junction without stenosis. Right PICA grossly patent at its origin. Left vertebral artery widely patent as it courses into the cranial vault. Left PICA patent. There is a focal severe stenosis involving the left V4 segment beyond the takeoff of the left PICA (series 5, image 28). Left V4 segment otherwise remains patent to the vertebrobasilar junction. Basilar patent to its distal aspect without stenosis. Anterior inferior cerebellar arteries  patent. Superior cerebellar arteries patent bilaterally. Both PCA supplied via the basilar as well as bilateral posterior communicating arteries. Focal mild stenosis noted  at the origin of the left P1 segment. PCAs otherwise well perfused to their distal aspects. Anatomic variants: Hypoplastic right A1 segment. Dominant right vertebral artery. Other: None. IMPRESSION: 1. 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 2. 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 3. 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 4. 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 5. Severe left V4 stenosis just beyond the takeoff of the left PICA. Dominant right vertebral artery widely patent. Electronically Signed   By: Jeannine Boga M.D.   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 Headache, chronic, new features or increased frequency. EXAM: MRI HEAD WITHOUT CONTRAST TECHNIQUE: Multiplanar, multiecho pulse sequences of the brain and surrounding structures were obtained without intravenous contrast. COMPARISON:  Head CT June 12, 2021. MRI of the brain June 02, 2012. FINDINGS: Brain: No acute infarction, hemorrhage, hydrocephalus, extra-axial collection or mass lesion. Small amount of scattered foci of T2 hyperintensity are seen within the white matter of the cerebral hemispheres, nonspecific, most likely related to chronic small vessel ischemia. 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 Moderate parenchymal volume loss, more pronounced in the bilateral parietal are regions. Vascular: Known occlusion of the visualized upper cervical right internal carotid artery with the diminutive caliber at the distal cavernous and supraclinoid segment. A 3.7 mm outpouching from the supraclinoid left ICA may represent an aneurysm (series  10, image 11). Remainder of the major intracranial vessels have normal flow void. Skull and upper cervical spine: Normal marrow signal. Sinuses/Orbits: Bilateral lens surgery. Paranasal sinuses are essentially clear. Other: None. IMPRESSION: 1. No acute intracranial abnormality. 2. Mild chronic microvascular ischemic changes of the white matter. 3. 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 4. Moderate parenchymal volume loss with parietal predominance. 5. Suspected left ICA posterior communicating segment 3.7 mm aneurysm. Correlation with MR angiogram suggested. 6. 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 Electronically Signed   By: Pedro Earls M.D.   On: 02/18/2022 15:52   DG C-Arm 1-60 Min-No Report  Result Date: 03/17/2022 Fluoroscopy was utilized by the requesting physician.  No radiographic interpretation.   MM 3D SCREEN BREAST BILATERAL  Result Date: 02/19/2022 CLINICAL DATA:  Screening. EXAM: DIGITAL SCREENING BILATERAL MAMMOGRAM WITH TOMOSYNTHESIS AND CAD TECHNIQUE: Bilateral screening digital craniocaudal and mediolateral oblique mammograms were obtained. Bilateral screening digital breast tomosynthesis was performed. The images were evaluated with computer-aided detection. COMPARISON:  Previous exam(s). ACR Breast Density Category c: The breast tissue is heterogeneously dense, which may obscure small masses. FINDINGS: There are no findings suspicious for malignancy. IMPRESSION: No mammographic evidence of malignancy. A result letter of this screening mammogram will be mailed directly to the patient. RECOMMENDATION: Screening mammogram in one year. (Code:SM-B-01Y) BI-RADS CATEGORY  1: Negative. Electronically Signed   By: Marin Olp M.D.   On: 02/19/2022 10:50   ECHOCARDIOGRAM COMPLETE  Result Date: 03/18/2022    ECHOCARDIOGRAM REPORT   Patient Name:   Sabrina [name] Date of Exam: 03/18/2022 Medical Rec #:  [id number]          Height:       61.0 in  Accession #:    [id number]         Weight:       161.6 lb Date of Birth:  December 14, 1940  BSA:          1.725 m Patient Age:    50 years           BP:           133/64 mmHg Patient Gender: F                  HR:           73 bpm. Exam Location:  [facility] Procedure: 2D Echo, Cardiac Doppler and Color Doppler Indications:     Cardiac arrest I46.9  History:         Patient has no prior history of Echocardiogram examinations.                  CHF, CAD, Prior CABG; Risk Factors:Hypertension.  Sonographer:     Sherrie Sport Referring Phys:  [id number] Flora Lipps Diagnosing Phys: Donnelly Angelica  Sonographer Comments: Technically challenging study due to limited acoustic windows, no apical window and no subcostal window. IMPRESSIONS  1. Left ventricular ejection fraction, by estimation, is 60 to 65%. The left ventricle has normal function. The left ventricle has no regional wall motion abnormalities. There is moderate left ventricular hypertrophy. Left ventricular diastolic function  could not be evaluated.  2. The mitral valve is abnormal. Trivial mitral valve regurgitation. Moderate mitral annular calcification.  3. The aortic valve is calcified. Aortic valve regurgitation is not visualized. Conclusion(s)/Recommendation(s): Very limited study. Normal LV function. Unable to assess for mitral stensos which previously was moderate to severe. Unable to assess for AS, and no TR velocity obtained to assess RVSP. FINDINGS  Left Ventricle: Left ventricular ejection fraction, by estimation, is 60 to 65%. The left ventricle has normal function. The left ventricle has no regional wall motion abnormalities. The left ventricular internal cavity size was normal in size. There is  moderate left ventricular hypertrophy. Left ventricular diastolic function could not be evaluated. Pericardium: There is no evidence of pericardial effusion. Mitral Valve: The mitral valve is abnormal. Moderate mitral annular calcification. Trivial mitral valve  regurgitation. Tricuspid Valve: The tricuspid valve is normal in structure. Tricuspid valve regurgitation is mild. Aortic Valve: The aortic valve is calcified. Aortic valve regurgitation is not visualized. Pulmonic Valve: The pulmonic valve was not well visualized. Pulmonic valve regurgitation is not visualized. No evidence of pulmonic stenosis. Aorta: The aortic root is normal in size and structure.  LEFT VENTRICLE PLAX 2D LVIDd:         3.80 cm LVIDs:         2.10 cm LV PW:         1.30 cm LV IVS:        1.30 cm LVOT diam:     2.00 cm LVOT Area:     3.14 cm  LEFT ATRIUM         Index LA diam:    3.90 cm 2.26 cm/m                        PULMONIC VALVE AORTA                 PV Vmax:        0.85 m/s Ao Root diam: 2.90 cm PV Vmean:       48.900 cm/s                       PV VTI:         0.124 m  PV Peak grad:   2.9 mmHg                       PV Mean grad:   1.0 mmHg                       RVOT Peak grad: 6 mmHg   SHUNTS Systemic Diam: 2.00 cm Pulmonic VTI:  0.241 m Donnelly Angelica Electronically signed by Donnelly Angelica Signature Date/Time: 03/18/2022/10:44:10 AM    Final     ECHO 11/16/2021 ECHOCARDIOGRAPHIC MEASUREMENTS  2D DIMENSIONS  AORTA                  Values   Normal Range   MAIN PA         Values    Normal Range                Annulus: 1.5 cm       [2.1-2.5]         PA Main: nm*       [1.5-2.1]              Aorta Sin: nm*          [2.7-3.3]    RIGHT VENTRICLE            ST Junction: nm*          [2.3-2.9]         RV Base: nm*       [<4.2]              Asc.Aorta: 2.6 cm       [2.3-3.1]          RV Mid: 3.0 cm    [<3.5]  LEFT VENTRICLE                                      RV Length: nm*       [<8.6]                  LVIDd: 3.7 cm       [3.9-5.3]    INFERIOR VENA CAVA                  LVIDs: 2.5 cm                        Max. IVC: nm*       [<=2.1]                     FS: 33.1 %       [>25]            Min. IVC: nm*                    SWT: 1.2 cm       [0.5-0.9]    ------------------                     PWT: 1.1 cm       [0.5-0.9]    nm* - not measured  LEFT ATRIUM                LA Diam: 4.2 cm       [2.7-3.8]            LA A4C Area: nm*          [<  20]              LA Volume: nm*          [22-52]  _________________________________________________________________________________________  ECHOCARDIOGRAPHIC DESCRIPTIONS  AORTIC ROOT                   Size: Normal             Dissection: INDETERM FOR DISSECTION  AORTIC VALVE               Leaflets: Tricuspid                   Morphology: MILDLY THICKENED               Mobility: Fully mobile  LEFT VENTRICLE                   Size: Normal                        Anterior: Normal            Contraction: Normal                         Lateral: Normal             Closest EF: >55% (Estimated)                Septal: Normal              LV Masses: No Masses                       Apical: Normal                    LVH: MILD LVH                      Inferior: Normal                                                      Posterior: Normal           Dias.FxClass: (Grade 1) relaxation abnormal, E/A reversal  MITRAL VALVE               Leaflets: Normal                        Mobility: PARTIALLY MOBILE             Morphology: THICKENED LEAFLET(S)  LEFT ATRIUM                   Size: MILDLY ENLARGED              LA Masses: No masses              IA Septum: Normal IAS  MAIN PA                   Size: Normal  PULMONIC VALVE             Morphology: Normal                        Mobility: Fully mobile  RIGHT VENTRICLE  RV Masses: No Masses                         Size: Normal              Free Wall: Normal                     Contraction: Normal  TRICUSPID VALVE               Leaflets: Normal                        Mobility: Fully mobile             Morphology: Normal  RIGHT ATRIUM                   Size: Normal                        RA Other: None                RA Mass: No masses  PERICARDIUM                  Fluid: No  effusion  INFERIOR VENACAVA                   Size: Normal Normal respiratory collapse  _________________________________________________________________________________________   DOPPLER ECHO and OTHER SPECIAL PROCEDURES                 Aortic: No AR                      TRIVIAL AS                         207.0 cm/sec peak vel      17.1 mmHg peak grad                         9.0 mmHg mean grad                 Mitral: TRIVIAL MR                 MODERATE MS                         164.0 cm/sec peak vel      10.8 mmHg peak grad                         5.0 mmHg mean grad                         MV Inflow E Vel = 151.0 cm/sec      MV Annulus E'Vel = 8.8 cm/sec                         E/E'Ratio = 17.2              Tricuspid: MODERATE TR                No TS                         330.0 cm/sec peak TR vel   53.6 mmHg peak RV  pressure              Pulmonary: MILD PR                    No PS                         111.0 cm/sec peak vel      4.9 mmHg peak grad  _________________________________________________________________________________________  INTERPRETATION  NORMAL LEFT VENTRICULAR SYSTOLIC FUNCTION   WITH MILD LVH  NORMAL RIGHT VENTRICULAR SYSTOLIC FUNCTION  UJW(JXB)=.14NW^2  MVA(VTI)=1.17cm^2  MODERATE TR  MODERATE MS  TRIVIAL AS  TRIVIAL MR  MILD PR  EF >55%  Mild/Mod AS  Mod /Severe MS   TELEMETRY reviewed by me: sinus rhythm 81 with 3 beats of NSVT  ASSESSMENT AND PLAN:  Sabrina [name] is an 84yoF with a PMH of CAD s/p CABG x3 with LIMA-LAD, SVG-distal RCA and SVG-ramus intermedius 03/2000 with recent heart cath 11/18/2021 showing extensive native CAD and 100% occlusion SVG-RCA and 90%, distal SVG-OM with 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  Cardiology is consulted for cardiopulmonary arrest post procedurally. Found to have an markedly elevated troponin at 15000, suggesting a perioperative MI.   #Cardiopulmonary arrest s/p successful resuscitation likely 2/2 perioperative NSTEMI  #s/p bilateral superior femoral and left iliac endarterectomies & stenting 03/17/22 #h/o left subclavian artery stenting 11/23/21 #CAD s/p three-vessel CABG  The patient presented for elective vascular surgery on 5/25 and she tolerated the long procedure well.  However, in the PACU postextubation she drank some water and had what was thought to be a bradycardic/vagal cardiopulmonary arrest from possibly aspiration, was intubated for 1 hour in the ICU, and was successfully extubated.  Initial troponin was checked at the time of the event was 9, repeat this morning was markedly elevated at 15,000 and EKG shows new lateral ST depressions, suggesting a perioperative NSTEMI. Currently denies any chest pain but does have extensive CAD with known occlusions of her multiple vein grafts, in addition to CKD 3, and limited vascular access after her stenting. -Continue supportive postoperative care per vascular surgery and primary -s/p 325 mg aspirin, continue DAPT with aspirin 17m and Plavix 782mdaily  -continue heparin drip for 48 hours -Continue Ranexa 500 mg twice daily -Echocardiogram complete was a technically very limited study but does show preserved LVEF 60-65% without regional wall motion abnormalities, moderate MR which is similar to prior echo, but the aortic valve was unable to be assessed for progression of her known aortic stenosis. -We will continue conservative medical management for now with further discussions regarding potential cardiac catheterization during this hospitalization or an outpatient basis based on her clinical course and recovery  from her procedure  #Hypertension Continue home medications which include hydralazine 100 mg 3 times daily, Ibresartan 300 mg once daily labetalol 300 mg twice daily  #CKD 3 Renal function around baseline with creatinine 1.49, EGFR 35 .  This patient's plan of care was discussed and created with Dr. RyDonnelly Angelicand he is in agreement.  Signed: LiTristan Schroeder PA-C 03/19/2022, 1:05 PM [facility]

## 2022-03-19 NOTE — Evaluation (Signed)
Physical Therapy Evaluation Patient Details Name: Sabrina [name] MRN: [id number] DOB: Feb 22, 1941 Today's Date: 03/19/2022  History of Present Illness  Pt is an 81 y/o F admitted on 03/17/22 after presenting to hospital for BLE endarterectomy for tx of severe PAD & claudication. Post procedure pt experienced acute cardiac arrest & placed on MV support, extubated on 5/24.  PMH: PAD, HTN, HLD, PVD, CAD s/p CABG, aortic stenosis, pulmonary HTN, GAD, chronic hip pain, GERD, hypothyroidism, CKD3  Clinical Impression  Pt seen for PT evaluation with OT joining later in the session. Prior to admission pt was independent with all mobility without AD & still driving. On this date, pt c/o BLE groin pain but more so lower abdominal pain & nurse notified. Pt requires max assist for supine>sit with HOB elevated & bed rails & mod assist +2 for STS & step pivot with BUE HHA. Pt visibly fatigued after mobility but reports pain improves once sitting in recliner. Pt would benefit from STR upon d/c to maximize independence with functional mobility, decrease caregiver burden, & reduce fall risk prior to return home.     Recommendations for follow up therapy are one component of a multi-disciplinary discharge planning process, led by the attending physician.  Recommendations may be updated based on patient status, additional functional criteria and insurance authorization.  Follow Up Recommendations Skilled nursing-short term rehab (<3 hours/day)    Assistance Recommended at Discharge Frequent or constant Supervision/Assistance  Patient can return home with the following  Two people to help with walking and/or transfers;Two people to help with bathing/dressing/bathroom;Help with stairs or ramp for entrance;Assist for transportation;Assistance with cooking/housework;Direct supervision/assist for financial management    Equipment Recommendations Wheelchair (measurements PT);Wheelchair cushion (measurements  PT);BSC/3in1  Recommendations for Other Services       Functional Status Assessment       Precautions / Restrictions Precautions Precautions: Fall Restrictions Weight Bearing Restrictions: No Other Position/Activity Restrictions: pt with BLE groin wound vacs      Mobility  Bed Mobility Overal bed mobility: Needs Assistance Bed Mobility: Supine to Sit     Supine to sit: Max assist, HOB elevated     General bed mobility comments: pt is able to initiate moving BLE towards EOB but ultimately requires max assist for supine>sit with HOB elevated    Transfers Overall transfer level: Needs assistance Equipment used: 2 person hand held assist Transfers: Sit to/from Stand, Bed to chair/wheelchair/BSC Sit to Stand: Mod assist, +2 physical assistance, From elevated surface   Step pivot transfers: Mod assist, +2 physical assistance            Ambulation/Gait                  Stairs            Wheelchair Mobility    Modified Rankin (Stroke Patients Only)       Balance Overall balance assessment: Needs assistance Sitting-balance support: Feet unsupported, Bilateral upper extremity supported Sitting balance-Leahy Scale: Poor   Postural control: Posterior lean Standing balance support: Bilateral upper extremity supported, During functional activity Standing balance-Leahy Scale: Poor                               Pertinent Vitals/Pain Pain Assessment Pain Assessment: Faces Faces Pain Scale: Hurts even more Pain Location: lower belly & BLE groin Pain Descriptors / Indicators: Discomfort Pain Intervention(s): Monitored during session (notified nurse of pt's c/o pain & firm abdomen)  Home Living Family/patient expects to be discharged to:: Private residence Living Arrangements: Alone Available Help at Discharge: Family;Available 24 hours/day (family visiting from [place] & can provide assistance upon d/c, they're hoping for pt to avoid  STR) Type of Home: House Home Access: Level entry       Home Layout: One level Home Equipment: Conservation officer, nature (2 wheels);Cane - single point      Prior Function Prior Level of Function : Independent/Modified Independent;Driving             Mobility Comments: independent without AD, denies falls, driving ADLs Comments: independent with cooking, cleaning, bathing, dressing     Hand Dominance        Extremity/Trunk Assessment   Upper Extremity Assessment Upper Extremity Assessment: Generalized weakness    Lower Extremity Assessment Lower Extremity Assessment: Generalized weakness (3/5 knee extension in sitting, BLE groin wound vacs intact (observed at beginning & end of session))       Communication   Communication: No difficulties  Cognition Arousal/Alertness: Awake/alert Behavior During Therapy: WFL for tasks assessed/performed Overall Cognitive Status: Within Functional Limits for tasks assessed                                 General Comments: sweet lady        General Comments General comments (skin integrity, edema, etc.): Pt c/o slight dizziness after transferring to recliner, BP in RUE 122/58 mmHg MAP 72    Exercises General Exercises - Lower Extremity Ankle Circles/Pumps: AROM, Strengthening, Both, 10 reps, Supine   Assessment/Plan    PT Assessment Patient needs continued PT services  PT Problem List Decreased strength;Cardiopulmonary status limiting activity;Pain;Decreased activity tolerance;Decreased knowledge of use of DME;Decreased balance;Decreased mobility       PT Treatment Interventions DME instruction;Therapeutic exercise;Gait training;Balance training;Neuromuscular re-education;Modalities;Functional mobility training;Therapeutic activities;Patient/family education;Manual techniques    PT Goals (Current goals can be found in the Care Plan section)  Acute Rehab PT Goals Patient Stated Goal: avoid STR, get better PT Goal  Formulation: With patient/family Time For Goal Achievement: 04/02/22 Potential to Achieve Goals: Fair    Frequency Min 2X/week     Co-evaluation PT/OT/SLP Co-Evaluation/Treatment: Yes Reason for Co-Treatment: For patient/therapist safety;To address functional/ADL transfers PT goals addressed during session: Mobility/safety with mobility;Balance         AM-PAC PT "6 Clicks" Mobility  Outcome Measure Help needed turning from your back to your side while in a flat bed without using bedrails?: A Lot Help needed moving from lying on your back to sitting on the side of a flat bed without using bedrails?: Total Help needed moving to and from a bed to a chair (including a wheelchair)?: Total Help needed standing up from a chair using your arms (e.g., wheelchair or bedside chair)?: Total Help needed to walk in hospital room?: Total Help needed climbing 3-5 steps with a railing? : Total 6 Click Score: 7    End of Session   Activity Tolerance: Patient tolerated treatment well;Patient limited by pain;Patient limited by fatigue Patient left: with call bell/phone within reach;with family/visitor present;in chair (in handoff to OT) Nurse Communication: Mobility status (firm abdomen & c/o pain, catheter not attached to leg bag, chest pads still donned but not hooked up) PT Visit Diagnosis: Difficulty in walking, not elsewhere classified (R26.2);Unsteadiness on feet (R26.81);Muscle weakness (generalized) (M62.81);Pain Pain - part of body:  (abdomen & BLE groin)    Time: 3016-0109 PT Time Calculation (  min) (ACUTE ONLY): 23 min   Charges:   PT Evaluation $PT Eval Moderate Complexity: 1 Mod PT Treatments $Therapeutic Activity: 8-22 mins        Lavone Nian, PT, DPT 03/19/22, 12:18 PM   Waunita Schooner 03/19/2022, 12:16 PM

## 2022-03-20 DIAGNOSIS — I70229 Atherosclerosis of native arteries of extremities with rest pain, unspecified extremity: Secondary | ICD-10-CM | POA: Diagnosis not present

## 2022-03-20 DIAGNOSIS — D649 Anemia, unspecified: Secondary | ICD-10-CM

## 2022-03-20 DIAGNOSIS — I469 Cardiac arrest, cause unspecified: Secondary | ICD-10-CM | POA: Diagnosis not present

## 2022-03-20 DIAGNOSIS — F419 Anxiety disorder, unspecified: Secondary | ICD-10-CM | POA: Diagnosis not present

## 2022-03-20 DIAGNOSIS — E038 Other specified hypothyroidism: Secondary | ICD-10-CM | POA: Diagnosis not present

## 2022-03-20 LAB — BASIC METABOLIC PANEL
Anion gap: 6 (ref 5–15)
BUN: 19 mg/dL (ref 8–23)
CO2: 24 mmol/L (ref 22–32)
Calcium: 8.4 mg/dL — ABNORMAL LOW (ref 8.9–10.3)
Chloride: 105 mmol/L (ref 98–111)
Creatinine, Ser: 1.5 mg/dL — ABNORMAL HIGH (ref 0.44–1.00)
GFR, Estimated: 35 mL/min — ABNORMAL LOW (ref >60)
Glucose, Bld: 106 mg/dL — ABNORMAL HIGH (ref 70–99)
Potassium: 3.7 mmol/L (ref 3.5–5.1)
Sodium: 135 mmol/L (ref 135–145)

## 2022-03-20 LAB — CBC
HCT: 23.6 % — ABNORMAL LOW (ref 36.0–46.0)
HCT: 28.7 % — ABNORMAL LOW (ref 36.0–46.0)
Hemoglobin: 7.5 g/dL — ABNORMAL LOW (ref 12.0–15.0)
Hemoglobin: 9.5 g/dL — ABNORMAL LOW (ref 12.0–15.0)
MCH: 28.3 pg (ref 26.0–34.0)
MCH: 29.1 pg (ref 26.0–34.0)
MCHC: 31.8 g/dL (ref 30.0–36.0)
MCHC: 33.1 g/dL (ref 30.0–36.0)
MCV: 89.1 fL (ref 80.0–100.0)
MCV: 87.8 fL (ref 80.0–100.0)
Platelets: 167 10*3/uL (ref 150–400)
Platelets: 176 10*3/uL (ref 150–400)
RBC: 2.65 MIL/uL — ABNORMAL LOW (ref 3.87–5.11)
RBC: 3.27 MIL/uL — ABNORMAL LOW (ref 3.87–5.11)
RDW: 13.5 % (ref 11.5–15.5)
RDW: 14 % (ref 11.5–15.5)
WBC: 8.8 10*3/uL (ref 4.0–10.5)
WBC: 10.2 10*3/uL (ref 4.0–10.5)
nRBC: 0 % (ref 0.0–0.2)
nRBC: 0 % (ref 0.0–0.2)

## 2022-03-20 LAB — PREPARE RBC (CROSSMATCH)

## 2022-03-20 LAB — HEPARIN LEVEL (UNFRACTIONATED): Heparin Unfractionated: 0.36 IU/mL (ref 0.30–0.70)

## 2022-03-20 MED ORDER — MENTHOL 3 MG MT LOZG
1.0000 | LOZENGE | OROMUCOSAL | Status: DC | PRN
  Administered 2022-03-21: 3 mg via ORAL
  Filled 2022-03-20: qty 9

## 2022-03-20 MED ORDER — LIDOCAINE VISCOUS HCL 2 % MT SOLN
15.0000 mL | Freq: Three times a day (TID) | OROMUCOSAL | Status: AC
  Administered 2022-03-20: 15 mL via OROMUCOSAL
  Filled 2022-03-20 (×3): qty 15

## 2022-03-20 MED ORDER — POLYETHYLENE GLYCOL 3350 17 G PO PACK
17.0000 g | PACK | Freq: Two times a day (BID) | ORAL | Status: DC
  Administered 2022-03-20 – 2022-03-22 (×5): 17 g via ORAL
  Filled 2022-03-20 (×7): qty 1

## 2022-03-20 MED ORDER — SORBITOL 70 % SOLN
960.0000 mL | TOPICAL_OIL | Freq: Once | ORAL | Status: AC | PRN
  Administered 2022-03-22: 100 mL via RECTAL
  Filled 2022-03-20: qty 473

## 2022-03-20 MED ORDER — SENNA 8.6 MG PO TABS
1.0000 | ORAL_TABLET | Freq: Every day | ORAL | Status: DC
  Administered 2022-03-20 – 2022-03-23 (×4): 8.6 mg via ORAL
  Filled 2022-03-20 (×4): qty 1

## 2022-03-20 MED ORDER — SODIUM CHLORIDE 0.9% IV SOLUTION: Freq: Once | INTRAVENOUS | Status: AC

## 2022-03-20 NOTE — Progress Notes (Signed)
[place] CARDIOLOGY CONSULT NOTE       Patient ID: Sabrina [name] MRN: [id number] DOB/AGE: 1941-02-15 81 y.o.  Admit date: 03/17/2022 Referring Physician Dr. Flora Lipps Primary Physician Dr. Deborra Medina Primary Cardiologist Dr. Lujean Amel Reason for Consultation cardiopulmonary arrest s/p resuscitation   HPI: Sabrina [name] is an 4yoF with a PMH of CAD s/p CABG x3 with LIMA-LAD, SVG-distal RCA and SVG-ramus intermedius 03/2000 with recent heart cath 11/18/2021 showing extensive native CAD and 100% occlusion SVG-RCA and 90%, distal SVG-OM with significant stenosis of her left subclavian artery, s/p left subclavian stenting on 11/23/2021, HFpEF (LVEF >55%, G1 DD mild-mild AS, mod-severe MS 10/2021), peripheral arterial disease, CKD 3, hypertension, hyperlipidemia, GERD, lumbar stenosis, chronic pain who presented to [facility] on [facility]/17/2022 for elective bilateral superior femoral artery and left iliac stenting with vascular surgery.  Cardiology is consulted for cardiopulmonary arrest post procedurally. Found to have an markedly elevated troponin at 15000, suggesting a perioperative MI.   Interval History:  - Feeling improved this morning.  - NO further chest tenderness from CPR. No shortness of breath or chest pain.  - Feels discouraged about her medical issues.   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 b.) TTE 11/16/2021: EF >55%; mild AS (MPG 9 mmHg)   CAD (coronary artery disease)    a.) s/p 3v  CABG 03/29/2000   Carotid artery stenosis    a.) s/p LEFT CEA 09/09/2003. b.) Carotid doppler 00/51/1021: 1-17% LICA, CTO RICA; subclavian stenosis   Cataracts, bilateral    Cervical spondylosis without myelopathy    Chronic diastolic CHF (congestive heart failure), NYHA class 3 (HCC)    a.) TTE 05/27/2016: EF >55%, mild LA enlargement, triv PR, mild MR, mod TR; G3DD. b.) TTE 12/12/2017: EF >55%, mild LVH, BAE, mild MR/PR, mod TR; RVSP 52.8 mmHg. c.) TTE 03/18/2020: EF >55%, BAD, AS (MPG 8.7 mmHg); triv MR, mild TR/PR. d.) TTE 11/16/2021: EF >55%, LVH, G1DD, triv MR, mild PR, mod TR; AS (MPG 9 mmHg); MS (MPG 5 mmHg)   Chronic kidney disease, stage III (moderate) (HCC)    Chronic narcotic use 06/24/2014   Chronic pain syndrome    GERD (gastroesophageal reflux disease)    History of 2019 novel coronavirus disease (COVID-19) 09/22/2021   Hyperlipidemia    Hypertension    Long term current use of antithrombotics/antiplatelets    a.) on daily DAPT therapy (ASA + clopidogrel)   Lumbar stenosis with neurogenic claudication    Mitral stenosis 11/16/2021   a.) TTE 11/16/2021: EF >55%; mod MS (MPG 5 mmHg)   Osteoarthritis of hip    Pulmonary hypertension ([place]) 12/12/2017   a.) TTE 12/12/2017: mild; RVSP 52.8 mmHg   PVD (peripheral vascular disease) (HCC)    Renal artery stenosis (HCC)    S/P CABG x 3 03/29/2000   a.) 3v CABG: LIMA-LAD, SVG-dRCA, SVG-RI   Secondary hyperparathyroidism (HCC)    SOB (shortness of breath)    Subclavian arterial stenosis ([place])  a.) s/p placement of 8.0 x 38 mm Lifestream stent to LEFT subclavian 11/23/2021.    Past Surgical History:  Procedure Laterality Date   ABDOMINAL HYSTERECTOMY  1976   CAROTID ARTERY ANGIOPLASTY Left    CAROTID ENDARTERECTOMY Left 09/09/2003   Procedure: CAROTID ENDARTERECTOMY; Location: [facility]; Surgeon: Maura Crandall, MD   COLONOSCOPY WITH PROPOFOL N/A 08/16/2017   Procedure: COLONOSCOPY WITH PROPOFOL;  Surgeon: [name], MD;   Location: [facility] ENDOSCOPY;  Service: Endoscopy;  Laterality: N/A;   CORONARY ANGIOPLASTY WITH STENT PLACEMENT  2000   CORONARY ARTERY BYPASS GRAFT N/A 03/29/2000   Procedure: 3v CORONARY ARTERY BYPASS GRAFT; Location: [facility]   CYSTOSCOPY WITH STENT PLACEMENT Bilateral    ENDARTERECTOMY FEMORAL Bilateral 03/17/2022   Procedure: ENDARTERECTOMY FEMORAL ( BILATERAL SFA STENT);  Surgeon: Katha Cabal, MD;  Location: [facility] ORS;  Service: Vascular;  Laterality: Bilateral;   ESOPHAGOGASTRODUODENOSCOPY (EGD) WITH PROPOFOL N/A 08/16/2017   Procedure: ESOPHAGOGASTRODUODENOSCOPY (EGD) WITH PROPOFOL;  Surgeon: [name], MD;  Location: [facility] ENDOSCOPY;  Service: Endoscopy;  Laterality: N/A;   ESOPHAGOGASTRODUODENOSCOPY (EGD) WITH PROPOFOL N/A 06/29/2018   Procedure: ESOPHAGOGASTRODUODENOSCOPY (EGD) WITH PROPOFOL;  Surgeon: Virgel Manifold, MD;  Location: [facility] ENDOSCOPY;  Service: Endoscopy;  Laterality: N/A;   INSERTION OF ILIAC STENT Left 03/17/2022   Procedure: INSERTION OF ILIAC STENT;  Surgeon: Katha Cabal, MD;  Location: [facility] ORS;  Service: Vascular;  Laterality: Left;   LAPAROSCOPIC CHOLECYSTECTOMY Left 10/26/1999   Procedure: LAPAROSCOPIC CHOLECYSTECTOMY; Location: [facility]; Surgeon: Rochel Brome, MD   LEFT HEART CATH AND CORONARY ANGIOGRAPHY N/A 11/18/2021   Procedure: LEFT HEART CATH AND CORONARY ANGIOGRAPHY;  Surgeon: Yolonda Kida, MD;  Location: [place] [facility];  Service: Cardiovascular;  Laterality: N/A;   LEFT HEART CATH AND CORS/GRAFTS ANGIOGRAPHY Left 11/19/2002   Procedure: LEFT HEART CATH AND CORS/GRAFTS ANGIOGRAPHY; Location: [facility]; Surgeon: Katrine Coho, MD   LEFT HEART CATH AND CORS/GRAFTS ANGIOGRAPHY Left 09/17/2003   Procedure: LEFT HEART CATH AND CORS/GRAFTS ANGIOGRAPHY; Location: [facility]; Surgeon: Katrine Coho, MD   LOWER EXTREMITY ANGIOGRAPHY Right 01/06/2022   Procedure: Lower Extremity Angiography;  Surgeon: Katha Cabal, MD;  Location: [place]  CV LAB;  Service: Cardiovascular;  Laterality: Right;   RENAL ARTERY ANGIOPLASTY Bilateral 12/2013   TOE AMPUTATION Right    small toe   TONSILLECTOMY AND ADENOIDECTOMY     TOTAL HIP ARTHROPLASTY Left 2005   TOTAL HIP ARTHROPLASTY Right 2015   UPPER EXTREMITY ANGIOGRAPHY Left 11/23/2021   Procedure: UPPER EXTREMITY ANGIOGRAPHY;  Surgeon: Algernon Huxley, MD;  Location: [place] [facility];  Service: Cardiovascular;  Laterality: Left;    Medications Prior to Admission  Medication Sig Dispense Refill Last Dose   Ascorbic Acid (VITAMIN C) 500 MG CAPS Take 500 mg by mouth daily. 30 capsule 3 03/16/2022   aspirin 81 MG tablet Take 81 mg by mouth daily.   03/16/2022   butalbital-acetaminophen-caffeine (FIORICET) 50-325-40 MG tablet Take 1 tablet by mouth every 4 (four) hours as needed for headache. 120 tablet 1 03/16/2022   docusate sodium (COLACE) 100 MG capsule Take 1 capsule (100 mg total) by mouth daily as needed for mild constipation or moderate constipation. 30 capsule 3 03/16/2022   ezetimibe (ZETIA) 10 MG tablet Take 1 tablet (10 mg total) by mouth daily. 90 tablet 1 03/17/2022   ferrous sulfate 325 (65 FE) MG EC tablet Take 1 tablet (325 mg total) by mouth 2 (two) times daily with a meal. 60 tablet 3 03/16/2022   hydrALAZINE (APRESOLINE)  100 MG tablet Take 1 tablet (100 mg total) by mouth 3 (three) times daily. Marland KitchenNOTE DOSE INCREASE TO 100 MG. KEEP ON FILE FOR FUTURE REFILLS 270 tablet 1 03/17/2022   labetalol (NORMODYNE) 300 MG tablet Take 1 tablet (300 mg total) by mouth 2 (two) times daily. For hypertension 180 tablet 1 03/17/2022   nortriptyline (PAMELOR) 10 MG capsule Take 1 capsule (10 mg total) by mouth 2 (two) times daily. For chronic headaches 180 capsule 1 03/17/2022   Omega 3 1000 MG CAPS Take 1,000 mg by mouth 2 (two) times daily.   03/16/2022   pantoprazole (PROTONIX) 40 MG tablet Take 1 tablet (40 mg total) by mouth daily. 30 tablet 5 03/17/2022   ranolazine (RANEXA) 500 MG 12 hr tablet Take  1 tablet (500 mg total) by mouth 2 (two) times daily. 60 tablet 3 03/17/2022   traMADol (ULTRAM) 50 MG tablet Take 1 tablet (50 mg total) by mouth every 6 (six) hours as needed. 120 tablet 5 03/16/2022   valsartan (DIOVAN) 320 MG tablet Take 1 tablet (320 mg total) by mouth daily. 90 tablet 0 03/16/2022   clopidogrel (PLAVIX) 75 MG tablet Take 1 tablet (75 mg total) by mouth daily. To prevent strokes 90 tablet 1 03/11/22   Social History   Socioeconomic History   Marital status: Divorced    Spouse name: Not on file   Number of children: 3   Years of education: Not on file   Highest education level: Not on file  Occupational History   Occupation: retired  Tobacco Use   Smoking status: Former    Types: Cigarettes    Quit date: 10/25/1998    Years since quitting: 23.4   Smokeless tobacco: Never  Vaping Use   Vaping Use: Never used  Substance and Sexual Activity   Alcohol use: No    Alcohol/week: 0.0 standard drinks   Drug use: Never   Sexual activity: Not Currently  Other Topics Concern   Not on file  Social History Narrative   Daughter [name] ([place]); 1 in [employer]; 1 in [place]   Lives alone   Social Determinants of Health   Financial Resource Strain: Not on file  Food Insecurity: Not on file  Transportation Needs: Not on file  Physical Activity: Not on file  Stress: Not on file  Social Connections: Not on file  Intimate Partner Violence: Not on file    Family History  Problem Relation Age of Onset   Stroke Mother    Hypertension Mother    Diabetes Mother    Hypertension Father    Heart disease Sister        MI   Multiple sclerosis Daughter    Multiple sclerosis Son    Cerebral aneurysm Son    Seizures Son    Cerebral aneurysm Son    Breast cancer Paternal Aunt 45      PHYSICAL EXAM General: Pleasant elderly black female, well nourished, in no acute distress.  Laying in ICU bed after transfer from recliner.  HEENT:  Normocephalic and atraumatic. Neck:  No  JVD.  Lungs: Normal respiratory effort on room air. Clear bilaterally to auscultation. No wheezes, crackles, rhonchi.  Heart: HRRR . Normal S1 and S2 without gallops or murmurs. Radial & DP pulses 2+ bilaterally. Chest: Tenderness to palpation of her anterior chest wall Abdomen: Non-distended appearing.  Msk: Normal strength and tone for age. Extremities: Warm and well perfused. No clubbing, cyanosis. No peripheral edema. Wound vacs at bilateral groins  Neuro: Alert and oriented X 3. Psych:  Anxious mood. Answers questions appropriately.   Labs:   Lab Results  Component Value Date   WBC 8.8 03/20/2022   HGB 7.5 (L) 03/20/2022   HCT 23.6 (L) 03/20/2022   MCV 89.1 03/20/2022   PLT 167 03/20/2022    Recent Labs  Lab 03/19/22 0449 03/20/22 0425  NA 137 135  K 3.7 3.7  CL 109 105  CO2 23 24  BUN 16 19  CREATININE 1.42* 1.50*  CALCIUM 8.2* 8.4*  PROT 5.8*  --   BILITOT 0.8  --   ALKPHOS 42  --   ALT 40  --   AST 68*  --   GLUCOSE 104* 106*    Lab Results  Component Value Date   CKTOTAL 189 03/17/2022   CKMB 1.8 01/30/2012   TROPONINI 0.04 01/30/2012     Lab Results  Component Value Date   CHOL 206 (H) 01/28/2022   CHOL 205 (H) 07/29/2020   CHOL 167 02/22/2018   Lab Results  Component Value Date   HDL 68 01/28/2022   HDL 49.10 07/29/2020   HDL 54.80 02/22/2018   Lab Results  Component Value Date   LDLCALC 123 (H) 01/28/2022   LDLCALC 130 (H) 07/29/2020   LDLCALC 102 (H) 02/22/2018   Lab Results  Component Value Date   TRIG 61 01/28/2022   TRIG 131.0 07/29/2020   TRIG 53.0 02/22/2018   Lab Results  Component Value Date   CHOLHDL 3.0 01/28/2022   CHOLHDL 4 07/29/2020   CHOLHDL 3 02/22/2018   Lab Results  Component Value Date   LDLDIRECT 109.0 03/05/2016      Radiology: MR Angiogram Head Wo Contrast  Result Date: 03/03/2022 CLINICAL DATA:  Follow-up examination for suspected ICA aneurysm. EXAM: MRA HEAD WITHOUT CONTRAST TECHNIQUE: Angiographic  images of the Circle of Willis were acquired using MRA technique without intravenous contrast. COMPARISON:  Comparison made with recent brain MRI from 02/18/2022. FINDINGS: Anterior circulation: Visualized cervical left ICA widely patent with antegrade flow. Horizontal petrous left ICA widely patent as well. Atheromatous irregularity throughout the left carotid siphon without high-grade stenosis. 2.5 mm saccular outpouching arising from the cavernous left ICA consistent with a small aneurysm (series 5, image 98). This is directed medially. Additional lobulated irregular aneurysm measuring up to 3.8 mm extending posteriorly from the supraclinoid left ICA consistent with a left posterior communicating artery aneurysm (series 5, image 119). This accounts for the abnormality seen on prior brain MRI. The cervical right ICA is largely occluded with only intermittent and severely attenuated flow seen through the petrous segment. Distal reconstitution at the supraclinoid right ICA via collateral flow across the circle-of-Willis and/or the of the left posterior communicating artery. A1 segments patent. Right A1 hypoplastic. Normal anterior communicating artery complex. Both ACAs patent to their distal aspects without stenosis. Both M1 segments patent without stenosis. Normal right MCA bifurcation. There is a 2.4 mm saccular aneurysm arising from the left MCA bifurcation (series 5, image 143). This is directed medially. Distal MCA branches well perfused bilaterally. Posterior circulation: Right vertebral artery dominant and patent to the vertebrobasilar junction without stenosis. Right PICA grossly patent at its origin. Left vertebral artery widely patent as it courses into the cranial vault. Left PICA patent. There is a focal severe stenosis involving the left V4 segment beyond the takeoff of the left PICA (series 5, image 28). Left V4 segment otherwise remains patent to the vertebrobasilar junction. Basilar patent to its  distal aspect without stenosis. Anterior inferior cerebellar arteries patent. Superior cerebellar arteries patent bilaterally. Both PCA supplied via the basilar as well as bilateral posterior communicating arteries. Focal mild stenosis noted at the origin of the left P1 segment. PCAs otherwise well perfused to their distal aspects. Anatomic variants: Hypoplastic right A1 segment. Dominant right vertebral artery. Other: None. IMPRESSION: 1. 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 2. 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 3. 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 4. 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 5. Severe left V4 stenosis just beyond the takeoff of the left PICA. Dominant right vertebral artery widely patent. Electronically Signed   By: Jeannine Boga M.D.   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 Headache, chronic, new features or increased frequency. EXAM: MRI HEAD WITHOUT CONTRAST TECHNIQUE: Multiplanar, multiecho pulse sequences of the brain and surrounding structures were obtained without intravenous contrast. COMPARISON:  Head CT June 12, 2021. MRI of the brain June 02, 2012. FINDINGS: Brain: No acute infarction, hemorrhage, hydrocephalus, extra-axial collection or mass lesion. Small amount of scattered foci of T2 hyperintensity are seen within the white matter of the cerebral hemispheres, nonspecific, most likely related to chronic small vessel ischemia. 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 Moderate parenchymal volume loss, more pronounced in the bilateral parietal are regions. Vascular: Known occlusion of the visualized upper cervical right internal carotid artery with the diminutive caliber at the distal cavernous and supraclinoid segment. A 3.7 mm  outpouching from the supraclinoid left ICA may represent an aneurysm (series 10, image 11). Remainder of the major intracranial vessels have normal flow void. Skull and upper cervical spine: Normal marrow signal. Sinuses/Orbits: Bilateral lens surgery. Paranasal sinuses are essentially clear. Other: None. IMPRESSION: 1. No acute intracranial abnormality. 2. Mild chronic microvascular ischemic changes of the white matter. 3. 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 4. Moderate parenchymal volume loss with parietal predominance. 5. Suspected left ICA posterior communicating segment 3.7 mm aneurysm. Correlation with MR angiogram suggested. 6. 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 Electronically Signed   By: Pedro Earls M.D.   On: 02/18/2022 15:52   DG C-Arm 1-60 Min-No Report  Result Date: 03/17/2022 Fluoroscopy was utilized by the requesting physician.  No radiographic interpretation.   MM 3D SCREEN BREAST BILATERAL  Result Date: 02/19/2022 CLINICAL DATA:  Screening. EXAM: DIGITAL SCREENING BILATERAL MAMMOGRAM WITH TOMOSYNTHESIS AND CAD TECHNIQUE: Bilateral screening digital craniocaudal and mediolateral oblique mammograms were obtained. Bilateral screening digital breast tomosynthesis was performed. The images were evaluated with computer-aided detection. COMPARISON:  Previous exam(s). ACR Breast Density Category c: The breast tissue is heterogeneously dense, which may obscure small masses. FINDINGS: There are no findings suspicious for malignancy. IMPRESSION: No mammographic evidence of malignancy. A result letter of this screening mammogram will be mailed directly to the patient. RECOMMENDATION: Screening mammogram in one year. (Code:SM-B-01Y) BI-RADS CATEGORY  1: Negative. Electronically Signed   By: Marin Olp M.D.   On: 02/19/2022 10:50   ECHOCARDIOGRAM COMPLETE  Result Date: 03/18/2022    ECHOCARDIOGRAM REPORT   Patient Name:   [facility] Date of  Exam: 03/18/2022 Medical Rec #:  [id number]          Height:       61.0 in Accession #:  [id number]         Weight:       161.6 lb Date of Birth:  1941-09-10          BSA:          1.725 m Patient Age:    35 years           BP:           133/64 mmHg Patient Gender: F                  HR:           73 bpm. Exam Location:  [facility] Procedure: 2D Echo, Cardiac Doppler and Color Doppler Indications:     Cardiac arrest I46.9  History:         Patient has no prior history of Echocardiogram examinations.                  CHF, CAD, Prior CABG; Risk Factors:Hypertension.  Sonographer:     Sherrie Sport Referring Phys:  [id number] Flora Lipps Diagnosing Phys: Donnelly Angelica  Sonographer Comments: Technically challenging study due to limited acoustic windows, no apical window and no subcostal window. IMPRESSIONS  1. Left ventricular ejection fraction, by estimation, is 60 to 65%. The left ventricle has normal function. The left ventricle has no regional wall motion abnormalities. There is moderate left ventricular hypertrophy. Left ventricular diastolic function  could not be evaluated.  2. The mitral valve is abnormal. Trivial mitral valve regurgitation. Moderate mitral annular calcification.  3. The aortic valve is calcified. Aortic valve regurgitation is not visualized. Conclusion(s)/Recommendation(s): Very limited study. Normal LV function. Unable to assess for mitral stensos which previously was moderate to severe. Unable to assess for AS, and no TR velocity obtained to assess RVSP. FINDINGS  Left Ventricle: Left ventricular ejection fraction, by estimation, is 60 to 65%. The left ventricle has normal function. The left ventricle has no regional wall motion abnormalities. The left ventricular internal cavity size was normal in size. There is  moderate left ventricular hypertrophy. Left ventricular diastolic function could not be evaluated. Pericardium: There is no evidence of pericardial effusion. Mitral Valve: The mitral valve is  abnormal. Moderate mitral annular calcification. Trivial mitral valve regurgitation. Tricuspid Valve: The tricuspid valve is normal in structure. Tricuspid valve regurgitation is mild. Aortic Valve: The aortic valve is calcified. Aortic valve regurgitation is not visualized. Pulmonic Valve: The pulmonic valve was not well visualized. Pulmonic valve regurgitation is not visualized. No evidence of pulmonic stenosis. Aorta: The aortic root is normal in size and structure.  LEFT VENTRICLE PLAX 2D LVIDd:         3.80 cm LVIDs:         2.10 cm LV PW:         1.30 cm LV IVS:        1.30 cm LVOT diam:     2.00 cm LVOT Area:     3.14 cm  LEFT ATRIUM         Index LA diam:    3.90 cm 2.26 cm/m                        PULMONIC VALVE AORTA                 PV Vmax:        0.85 m/s Ao Root diam: 2.90 cm PV Vmean:       48.900 cm/s  PV VTI:         0.124 m                       PV Peak grad:   2.9 mmHg                       PV Mean grad:   1.0 mmHg                       RVOT Peak grad: 6 mmHg   SHUNTS Systemic Diam: 2.00 cm Pulmonic VTI:  0.241 m Donnelly Angelica Electronically signed by Donnelly Angelica Signature Date/Time: 03/18/2022/10:44:10 AM    Final     ECHO 11/16/2021 ECHOCARDIOGRAPHIC MEASUREMENTS  2D DIMENSIONS  AORTA                  Values   Normal Range   MAIN PA         Values    Normal Range                Annulus: 1.5 cm       [2.1-2.5]         PA Main: nm*       [1.5-2.1]              Aorta Sin: nm*          [2.7-3.3]    RIGHT VENTRICLE            ST Junction: nm*          [2.3-2.9]         RV Base: nm*       [<4.2]              Asc.Aorta: 2.6 cm       [2.3-3.1]          RV Mid: 3.0 cm    [<3.5]  LEFT VENTRICLE                                      RV Length: nm*       [<8.6]                  LVIDd: 3.7 cm       [3.9-5.3]    INFERIOR VENA CAVA                  LVIDs: 2.5 cm                        Max. IVC: nm*       [<=2.1]                     FS: 33.1 %       [>25]            Min. IVC: nm*                     SWT: 1.2 cm       [0.5-0.9]    ------------------                    PWT: 1.1 cm       [0.5-0.9]    nm* - not measured  LEFT ATRIUM  LA Diam: 4.2 cm       [2.7-3.8]            LA A4C Area: nm*          [<20]              LA Volume: nm*          [22-52]  _________________________________________________________________________________________  ECHOCARDIOGRAPHIC DESCRIPTIONS  AORTIC ROOT                   Size: Normal             Dissection: INDETERM FOR DISSECTION  AORTIC VALVE               Leaflets: Tricuspid                   Morphology: MILDLY THICKENED               Mobility: Fully mobile  LEFT VENTRICLE                   Size: Normal                        Anterior: Normal            Contraction: Normal                         Lateral: Normal             Closest EF: >55% (Estimated)                Septal: Normal              LV Masses: No Masses                       Apical: Normal                    LVH: MILD LVH                      Inferior: Normal                                                      Posterior: Normal           Dias.FxClass: (Grade 1) relaxation abnormal, E/A reversal  MITRAL VALVE               Leaflets: Normal                        Mobility: PARTIALLY MOBILE             Morphology: THICKENED LEAFLET(S)  LEFT ATRIUM                   Size: MILDLY ENLARGED              LA Masses: No masses              IA Septum: Normal IAS  MAIN PA                   Size: Normal  PULMONIC VALVE             Morphology: Normal  Mobility: Fully mobile  RIGHT VENTRICLE              RV Masses: No Masses                         Size: Normal              Free Wall: Normal                     Contraction: Normal  TRICUSPID VALVE               Leaflets: Normal                        Mobility: Fully mobile             Morphology: Normal  RIGHT ATRIUM                   Size: Normal                        RA Other: None                 RA Mass: No masses  PERICARDIUM                  Fluid: No effusion  INFERIOR VENACAVA                   Size: Normal Normal respiratory collapse  _________________________________________________________________________________________   DOPPLER ECHO and OTHER SPECIAL PROCEDURES                 Aortic: No AR                      TRIVIAL AS                         207.0 cm/sec peak vel      17.1 mmHg peak grad                         9.0 mmHg mean grad                 Mitral: TRIVIAL MR                 MODERATE MS                         164.0 cm/sec peak vel      10.8 mmHg peak grad                         5.0 mmHg mean grad                         MV Inflow E Vel = 151.0 cm/sec      MV Annulus E'Vel = 8.8 cm/sec                         E/E'Ratio = 17.2              Tricuspid: MODERATE TR                No TS  330.0 cm/sec peak TR vel   53.6 mmHg peak RV pressure              Pulmonary: MILD PR                    No PS                         111.0 cm/sec peak vel      4.9 mmHg peak grad  _________________________________________________________________________________________  INTERPRETATION  NORMAL LEFT VENTRICULAR SYSTOLIC FUNCTION   WITH MILD LVH  NORMAL RIGHT VENTRICULAR SYSTOLIC FUNCTION  OBS(JGG)=.83MO^2  MVA(VTI)=1.17cm^2  MODERATE TR  MODERATE MS  TRIVIAL AS  TRIVIAL MR  MILD PR  EF >55%  Mild/Mod AS  Mod /Severe MS   TELEMETRY reviewed by me: sinus rhythm 81 with 3 beats of NSVT  ASSESSMENT AND PLAN:  Leshonda Broda is an 65yoF with a PMH of CAD s/p CABG x3 with LIMA-LAD, SVG-distal RCA and SVG-ramus intermedius 03/2000 with recent heart cath 11/18/2021 showing extensive native CAD and 100% occlusion SVG-RCA and 90%, distal SVG-OM with significant stenosis of her left subclavian artery, s/p left subclavian stenting on 11/23/2021, HFpEF (LVEF >55%, G1 DD mild-mild AS, mod-severe MS 10/2021), peripheral arterial disease, CKD 3, hypertension,  hyperlipidemia, GERD, lumbar stenosis, chronic pain who presented to [facility] on [facility]17/[facility]22 for elective bilateral superior femoral artery and left iliac stenting with vascular surgery.  Cardiology is consulted for cardiopulmonary arrest post procedurally. Found to have an markedly elevated troponin at 15000, suggesting a perioperative MI.   #Cardiopulmonary arrest s/p successful resuscitation likely 2/2 perioperative NSTEMI  #s/p bilateral superior femoral and left iliac endarterectomies & stenting 03/17/22 #h/o left subclavian artery stenting 11/23/21 #CAD s/p three-vessel CABG  The patient presented for elective vascular surgery on 5/25 and she tolerated the long procedure well.  However, in the PACU postextubation she drank some water and had what was thought to be a bradycardic/vagal cardiopulmonary arrest from possibly aspiration, was intubated for 1 hour in the ICU, and was successfully extubated.  Initial troponin was checked at the time of the event was 9, repeat waswas markedly elevated at 15,000 about 18 hrs later and EKG shows new lateral ST depressions, suggesting a perioperative NSTEMI. Currently denies any chest pain but does have extensive CAD with known occlusions of her multiple vein grafts, in addition to CKD 3, and limited vascular access. Not having any pain.  -Continue supportive postoperative care per vascular surgery and primary -s/p 325 mg aspirin, continue DAPT with aspirin 35m and Plavix 772mdaily for at least 12 months -continue heparin drip for 48 hours (ending today) -Continue Ranexa 500 mg twice daily -Echocardiogram complete was a technically very limited study but does show preserved LVEF 60-65% without regional wall motion abnormalities, moderate MR which is similar to prior echo, but the aortic valve was unable to be assessed for progression of her known aortic stenosis. -We will continue conservative medical management for now with further discussions regarding potential  cardiac catheterization during this hospitalization or an outpatient basis based on her clinical course and recovery from her procedure  #Hypertension Continue home medications which include hydralazine 100 mg 3 times daily, Ibresartan 300 mg once daily labetalol 300 mg twice daily  #CKD 3 Renal function around baseline with creatinine 1.49, EGFR 35 .   Signed: RYAndrez Grime MD 03/20/2022, 7:08 AM [facility]

## 2022-03-20 NOTE — Progress Notes (Signed)
    Subjective  - POD #3, Status post bilateral femoral endarterectomy with patch angioplasty and left external iliac stent 1,  Complaining of numbness around both medial knee.  No significant foot pain   Physical Exam:  Prevena wound vacs in place.  There have been some issues with VAC seal. Extremities are warm and well-perfused       Assessment/Plan:  POD #3  Stable from vascular perspective.  There have been some issues with her Prevena wound vacs.  If these continue to have difficulty with the seal, they can be removed or replaced.  Continue dual antiplatelet therapy  Procedure complicated by postoperative arrest.  Anticipate cardiac cath possibly as an outpatient  [name] 03/20/2022 3:06 PM --  Vitals:   03/20/22 1138 03/20/22 1231  BP: 123/62 (!) 105/57  Pulse: 69 67  Resp: 18 14  Temp: 98.1 F (36.7 C) 98.2 F (36.8 C)  SpO2:  97%    Intake/Output Summary (Last 24 hours) at 03/20/2022 1506 Last data filed at 03/20/2022 1401 Gross per 24 hour  Intake 1233.98 ml  Output 0 ml  Net 1233.98 ml     Laboratory CBC    Component Value Date/Time   WBC 8.8 03/20/2022 0425   HGB 7.5 (L) 03/20/2022 0425   HGB 9.2 (L) 04/13/2014 0952   HCT 23.6 (L) 03/20/2022 0425   HCT 24.7 (L) 04/12/2014 1143   PLT 167 03/20/2022 0425   PLT 182 04/11/2014 0558    BMET    Component Value Date/Time   NA 135 03/20/2022 0425   NA 139 09/13/2014 0000   NA 134 (L) 04/12/2014 0558   K 3.7 03/20/2022 0425   K 4.4 04/12/2014 0558   CL 105 03/20/2022 0425   CL 101 04/12/2014 0558   CO2 24 03/20/2022 0425   CO2 27 04/12/2014 0558   GLUCOSE 106 (H) 03/20/2022 0425   GLUCOSE 90 04/12/2014 0558   BUN 19 03/20/2022 0425   BUN 18 09/13/2014 0000   BUN 16 04/12/2014 0558   CREATININE 1.50 (H) 03/20/2022 0425   CREATININE 1.12 04/12/2014 0558   CALCIUM 8.4 (L) 03/20/2022 0425   CALCIUM 8.5 04/12/2014 0558   GFRNONAA 35 (L) 03/20/2022 0425   GFRNONAA 49 (L) 04/12/2014  0558   GFRAA 50 (L) 03/10/2020 1311   GFRAA 57 (L) 04/12/2014 0558    COAG Lab Results  Component Value Date   INR 1.0 03/27/2014   No results found for: PTT  Antibiotics Anti-infectives (From admission, onward)    Start     Dose/Rate Route Frequency Ordered Stop   03/17/22 2000  vancomycin (VANCOCIN) IVPB 1000 mg/200 mL premix        1,000 mg 200 mL/hr over 60 Minutes Intravenous Every 12 hours 03/17/22 1428 03/18/22 1010   03/17/22 0621  vancomycin (VANCOCIN) 1-5 GM/200ML-% IVPB       Note to Pharmacy: Lyman Bishop T: cabinet override      03/17/22 0621 03/17/22 0815   03/17/22 0600  vancomycin (VANCOCIN) IVPB 1000 mg/200 mL premix        1,000 mg 200 mL/hr over 60 Minutes Intravenous On call to O.R. 03/16/22 2345 03/17/22 0854        V. Leia Alf, M.D., [facility] [place] Office: [phone] Pager:  [phone]

## 2022-03-20 NOTE — Consult Note (Signed)
ANTICOAGULATION CONSULT NOTE  Pharmacy Consult for IV Heparin Indication: chest pain/ACS  Patient Measurements: Height: 5\' 1"  (154.9 cm) Weight: 73.3 kg (161 lb 9.6 oz) IBW/kg (Calculated) : 47.8 Heparin Dosing Weight: 63.8 kg  Labs: Recent Labs    03/17/22 1406 03/18/22 0007 03/18/22 1004 03/18/22 1900 03/18/22 2303 03/19/22 0449 03/19/22 0706 03/19/22 1557 03/19/22 2337  HGB 9.1* 8.5*  --   --   --  7.9*  --   --   --   HCT 27.9* 25.9*  --   --   --  24.3*  --   --   --   PLT 213 204  --   --   --  162  --   --   --   HEPARINUNFRC  --   --   --   --    < >  --  0.72* 0.43 0.39  CREATININE 1.52* 1.49*  --   --   --  1.42*  --   --   --   CKTOTAL 189  --   --   --   --   --   --   --   --   TROPONINIHS 9  --  53,664* 15,443*  --   --   --   --   --    < > = values in this interval not displayed.     Estimated Creatinine Clearance: 28.9 mL/min (A) (by C-G formula based on SCr of 1.42 mg/dL (H)).   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 b.) TTE 11/16/2021: EF >55%; mild AS (MPG 9 mmHg)   CAD (coronary artery disease)    a.) s/p 3v CABG 03/29/2000   Carotid artery stenosis    a.) s/p LEFT CEA 09/09/2003. b.) Carotid doppler 40/34/7425: 9-56% LICA, CTO RICA; subclavian stenosis   Cataracts, bilateral    Cervical spondylosis without myelopathy    Chronic diastolic CHF (congestive heart failure), NYHA class 3 (HCC)    a.) TTE 05/27/2016: EF >55%, mild LA enlargement, triv PR, mild MR, mod TR; G3DD. b.) TTE 12/12/2017: EF >55%, mild LVH, BAE, mild  MR/PR, mod TR; RVSP 52.8 mmHg. c.) TTE 03/18/2020: EF >55%, BAD, AS (MPG 8.7 mmHg); triv MR, mild TR/PR. [name].) TTE 11/16/2021: EF >55%, LVH, G1DD, triv MR, mild PR, mod TR; AS (MPG 9 mmHg); MS (MPG 5 mmHg)   Chronic kidney disease, stage III (moderate) (HCC)    Chronic narcotic use 06/24/2014   Chronic pain syndrome    GERD (gastroesophageal reflux disease)    History of 2019 novel coronavirus disease (COVID-19) 09/22/2021   Hyperlipidemia    Hypertension    Long term current use of antithrombotics/antiplatelets    a.) on daily DAPT therapy (ASA + clopidogrel)   Lumbar stenosis with neurogenic claudication    Mitral stenosis 11/16/2021   a.) TTE 11/16/2021: EF >55%; mod MS (MPG 5 mmHg)   Osteoarthritis of hip    Pulmonary hypertension ([place]) 12/12/2017   a.) TTE 12/12/2017: mild; RVSP 52.8 mmHg   PVD (peripheral vascular disease) ([place])  Renal artery stenosis (HCC)    S/P CABG x 3 03/29/2000   a.) 3v CABG: LIMA-LAD, SVG-dRCA, SVG-RI   Secondary hyperparathyroidism (HCC)    SOB (shortness of breath)    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    Medications:  No anticoagulation prior to admission per my chart review  Assessment: Patient is an 81 y/o F with medical history as above and including CAD s/p CABG, PVD, carotid artery stenosis, renal artery stenosis who underwent elective bilateral common femoral, superficial femoral and profunda femoris endarterectomy 5/24. Post-operatively, patient went unresponsive requiring CPR x 2 minutes. Troponin today [id number]. Pharmacy consulted to manage heparin infusion for suspected ACS.  CBC with acute on chronic anemia (Hgb 8.5 with BL 11).    Goal of Therapy:  Heparin level 0.3-0.7 units/ml Monitor platelets by anticoagulation protocol: Yes   Plan:  5/26:  HL @ 2337 = 0.39, therapeutic X 2  Will continue pt on current rate and recheck HL on 5/27 with AM  labs.  Sabrina [name] 03/20/2022,12:53 AM

## 2022-03-20 NOTE — Consult Note (Signed)
ANTICOAGULATION CONSULT NOTE  Pharmacy Consult for IV Heparin Indication: chest pain/ACS  Patient Measurements: Height: 5\' 1"  (154.9 cm) Weight: 73.3 kg (161 lb 9.6 oz) IBW/kg (Calculated) : 47.8 Heparin Dosing Weight: 63.8 kg  Labs: Recent Labs    03/17/22 1406 03/18/22 0007 03/18/22 1004 03/18/22 1900 03/18/22 2303 03/19/22 0449 03/19/22 0706 03/19/22 1557 03/19/22 2337 03/20/22 0425  HGB 9.1* 8.5*  --   --   --  7.9*  --   --   --  7.5*  HCT 27.9* 25.9*  --   --   --  24.3*  --   --   --  23.6*  PLT 213 204  --   --   --  162  --   --   --  167  HEPARINUNFRC  --   --   --   --    < >  --    < > 0.43 0.39 0.36  CREATININE 1.52* 1.49*  --   --   --  1.42*  --   --   --  1.50*  CKTOTAL 189  --   --   --   --   --   --   --   --   --   TROPONINIHS 9  --  34,742* 15,443*  --   --   --   --   --   --    < > = values in this interval not displayed.     Estimated Creatinine Clearance: 27.4 mL/min (A) (by C-G formula based on SCr of 1.5 mg/dL (H)).   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 b.) TTE 11/16/2021: EF >55%; mild AS (MPG 9 mmHg)   CAD (coronary artery disease)    a.) s/p 3v CABG 03/29/2000   Carotid artery stenosis    a.) s/p LEFT CEA 09/09/2003. b.) Carotid doppler 59/56/3875: 6-43% LICA, CTO RICA; subclavian stenosis   Cataracts, bilateral    Cervical spondylosis without myelopathy    Chronic diastolic CHF (congestive heart failure), NYHA class 3 (HCC)    a.) TTE 05/27/2016: EF >55%, mild LA enlargement, triv PR, mild MR, mod TR; G3DD.  b.) TTE 12/12/2017: EF >55%, mild LVH, BAE, mild MR/PR, mod TR; RVSP 52.8 mmHg. c.) TTE 03/18/2020: EF >55%, BAD, AS (MPG 8.7 mmHg); triv MR, mild TR/PR. d.) TTE 11/16/2021: EF >55%, LVH, G1DD, triv MR, mild PR, mod TR; AS (MPG 9 mmHg); MS (MPG 5 mmHg)   Chronic kidney disease, stage III (moderate) (HCC)    Chronic narcotic use 06/24/2014   Chronic pain syndrome    GERD (gastroesophageal reflux disease)    History of 2019 novel coronavirus disease (COVID-19) 09/22/2021   Hyperlipidemia    Hypertension    Long term current use of antithrombotics/antiplatelets    a.) on daily DAPT therapy (ASA + clopidogrel)   Lumbar stenosis with neurogenic claudication    Mitral stenosis 11/16/2021   a.) TTE 11/16/2021: EF >55%; mod MS (MPG 5 mmHg)   Osteoarthritis of hip    Pulmonary hypertension ([place]) 12/12/2017  a.) TTE 12/12/2017: mild; RVSP 52.8 mmHg   PVD (peripheral vascular disease) ([place])    Renal artery stenosis (HCC)    S/P CABG x 3 03/29/2000   a.) 3v CABG: LIMA-LAD, SVG-dRCA, SVG-RI   Secondary hyperparathyroidism (HCC)    SOB (shortness of breath)    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    Medications:  No anticoagulation prior to admission per my chart review  Assessment: Patient is an 81 y/o F with medical history as above and including CAD s/p CABG, PVD, carotid artery stenosis, renal artery stenosis who underwent elective bilateral common femoral, superficial femoral and profunda femoris endarterectomy 5/24. Post-operatively, patient went unresponsive requiring CPR x 2 minutes. Troponin today [id number]. Pharmacy consulted to manage heparin infusion for suspected ACS.  CBC with acute on chronic anemia (Hgb 8.5 with BL 11).    Goal of Therapy:  Heparin level 0.3-0.7 units/ml Monitor platelets by anticoagulation protocol: Yes   Plan:  5/27:  HL @ 0425 = 0.36, therapeutic X 3  Will continue pt on  current rate and recheck HL on 5/28 with AM labs.  Lancelot Alyea D 03/20/2022,5:26 AM

## 2022-03-20 NOTE — Progress Notes (Signed)
PROGRESS NOTE  Sabrina [name]    DOB: 02/02/1941, 81 y.o.  ZHY:[id number]    Code Status: Full Code   DOA: 03/17/2022   LOS: 3   Brief hospital course  Sabrina [name] is a 81 y.o. female with a PMH significant for PAD, HTN, HLD, PVD, CAD s/p CABG, aortic stenosis, pulmonary HTN, GAD, chronic hip pain, GERD, hypothyroidism, CKD III.  They presented from home to the [facility] on 03/17/2022 for bilateral lower extremity endarterectomy in treatment of severe PAD and claudication.  5/24 b/l femoral endarterectomy and stenting 5/24 post procedure, experienced acute cardiac arrest, placed on MV support 5/24 extubated 5/25 identified ACS, started on heparin gtt 5/27 heparin drip stopped. 1 unit pRBCs transfused  03/20/22 -stable  Assessment & Plan  Principal Problem:   Atherosclerosis of artery of extremity with rest pain [facility]) Active Problems:   Cardiac arrest ([place])   Respiratory arrest ([place])   Other specified hypothyroidism   Anxiety and depression  Severe ACUTE Hypoxic and Hypercapnic Respiratory Failure- resolved. s/p intubation. She is stable ORA without respiratory symptoms   CARDIAC FAILURE-NSTEMI/CARDIAC ARREST- stable. Troponins elevated to 15,682. She is endorsing significant pain of chest wall - cardiology consulted, appreciate recs  - likely cath outpatient - continue heparin gtt - PT/OT recommending SNF and patient is agreeable   ACUTE KIDNEY INJURY on CKDIII- appears to be close to baseline. Cr 1.49>1.42>1.5 today - BMP am  Constipation- patient endorses 6 days since last BM. Denies any abdominal discomfort - miralax BID - senna daily - SMOG PRN  PAD- s/p endarterectomy b/l LE. Wound vacs in place - vascular surgery consulted, appreciate recs  CAD s/p CABG  HLD  HTN- poorly controlled HTN on home medications.  - added on spironolactone 5/26 and Bps have been better controlled - continue ASA, plavix, zetia - continue home antihypertensives  Anemia-  transfusion threshold 8 - transfuse 1 unit pRBCs - post CBC  Hypothyroidism-  - continue home levothyroxine  Anxiety/depression- chronic - continue home medications  Body mass index is 30.53 kg/m.  VTE ppx: TED hose Start: 03/17/22 1428   Diet:     Diet   Diet regular Room service appropriate? Yes; Fluid consistency: Thin   Consultants: Cardiology CCM Vascular surgery Subjective 03/20/22    Pt reports feeling improved today. Denies abdominal discomfort. She has no complaints. Agreeable to going to SNF.   Objective   Vitals:   03/19/22 1915 03/19/22 2126 03/20/22 0118 03/20/22 0431  BP: (!) 123/43 (!) 137/47 (!) 115/46 (!) 135/56  Pulse: 79  65 79  Resp: 20 20 16 16   Temp: 98.9 F (37.2 C)   98.7 F (37.1 C)  TempSrc: Oral   Oral  SpO2: 98% 98% 97% 100%  Weight:      Height:        Intake/Output Summary (Last 24 hours) at 03/20/2022 0802 Last data filed at 03/20/2022 0500 Gross per 24 hour  Intake 746.78 ml  Output 300 ml  Net 446.78 ml    Filed Weights   03/17/22 0631 03/17/22 1428  Weight: 71.7 kg 73.3 kg     Physical Exam:  General: awake, alert, NAD HEENT: atraumatic, clear conjunctiva, anicteric sclera, MMM, hearing grossly normal Respiratory: normal respiratory effort. Cardiovascular: quick capillary refill, normal S1/S2, RRR, no JVD, murmurs Gastrointestinal: soft, NT, ND Nervous: A&O x3. no gross focal neurologic deficits, normal speech Extremities: moves all equally, no edema, normal tone Skin: dry, intact, normal temperature, normal color. No rashes, lesions  or ulcers on exposed skin. Wound vacs bilateral groins intact, no drainage Psychiatry: anxious mood, congruent affect  Labs   I have personally reviewed the following labs and imaging studies CBC    Component Value Date/Time   WBC 8.8 03/20/2022 0425   RBC 2.65 (L) 03/20/2022 0425   HGB 7.5 (L) 03/20/2022 0425   HGB 9.2 (L) 04/13/2014 0952   HCT 23.6 (L) 03/20/2022 0425   HCT  24.7 (L) 04/12/2014 1143   PLT 167 03/20/2022 0425   PLT 182 04/11/2014 0558   MCV 89.1 03/20/2022 0425   MCV 85 03/27/2014 0944   MCH 28.3 03/20/2022 0425   MCHC 31.8 03/20/2022 0425   RDW 13.5 03/20/2022 0425   RDW 13.7 03/27/2014 0944   LYMPHSABS 1.2 03/17/2022 1406   MONOABS 0.1 03/17/2022 1406   EOSABS 0.0 03/17/2022 1406   BASOSABS 0.0 03/17/2022 1406      Latest Ref Rng & Units 03/20/2022    4:25 AM 03/19/2022    4:49 AM 03/18/2022   12:07 AM  BMP  Glucose 70 - 99 mg/dL 106   104   118    BUN 8 - 23 mg/dL 19   16   18     Creatinine 0.44 - 1.00 mg/dL 1.50   1.42   1.49    Sodium 135 - 145 mmol/L 135   137   136    Potassium 3.5 - 5.1 mmol/L 3.7   3.7   3.9    Chloride 98 - 111 mmol/L 105   109   108    CO2 22 - 32 mmol/L 24   23   24     Calcium 8.9 - 10.3 mg/dL 8.4   8.2   8.0      ECHOCARDIOGRAM COMPLETE  Result Date: 03/18/2022    ECHOCARDIOGRAM REPORT   Patient Name:   Sabrina [name] Date of Exam: 03/18/2022 Medical Rec #:  [id number]          Height:       61.0 in Accession #:    [id number]         Weight:       161.6 lb Date of Birth:  1940/12/24          BSA:          1.725 m Patient Age:    85 years           BP:           133/64 mmHg Patient Gender: F                  HR:           73 bpm. Exam Location:  [facility] Procedure: 2D Echo, Cardiac Doppler and Color Doppler Indications:     Cardiac arrest I46.9  History:         Patient has no prior history of Echocardiogram examinations.                  CHF, CAD, Prior CABG; Risk Factors:Hypertension.  Sonographer:     Sherrie Sport Referring Phys:  [id number] Flora Lipps Diagnosing Phys: Donnelly Angelica  Sonographer Comments: Technically challenging study due to limited acoustic windows, no apical window and no subcostal window. IMPRESSIONS  1. Left ventricular ejection fraction, by estimation, is 60 to 65%. The left ventricle has normal function. The left ventricle has no regional wall motion abnormalities. There is moderate left  ventricular hypertrophy. Left ventricular diastolic function  could not be evaluated.  2. The mitral valve is abnormal. Trivial mitral valve regurgitation. Moderate mitral annular calcification.  3. The aortic valve is calcified. Aortic valve regurgitation is not visualized. Conclusion(s)/Recommendation(s): Very limited study. Normal LV function. Unable to assess for mitral stensos which previously was moderate to severe. Unable to assess for AS, and no TR velocity obtained to assess RVSP. FINDINGS  Left Ventricle: Left ventricular ejection fraction, by estimation, is 60 to 65%. The left ventricle has normal function. The left ventricle has no regional wall motion abnormalities. The left ventricular internal cavity size was normal in size. There is  moderate left ventricular hypertrophy. Left ventricular diastolic function could not be evaluated. Pericardium: There is no evidence of pericardial effusion. Mitral Valve: The mitral valve is abnormal. Moderate mitral annular calcification. Trivial mitral valve regurgitation. Tricuspid Valve: The tricuspid valve is normal in structure. Tricuspid valve regurgitation is mild. Aortic Valve: The aortic valve is calcified. Aortic valve regurgitation is not visualized. Pulmonic Valve: The pulmonic valve was not well visualized. Pulmonic valve regurgitation is not visualized. No evidence of pulmonic stenosis. Aorta: The aortic root is normal in size and structure.  LEFT VENTRICLE PLAX 2D LVIDd:         3.80 cm LVIDs:         2.10 cm LV PW:         1.30 cm LV IVS:        1.30 cm LVOT diam:     2.00 cm LVOT Area:     3.14 cm  LEFT ATRIUM         Index LA diam:    3.90 cm 2.26 cm/m                        PULMONIC VALVE AORTA                 PV Vmax:        0.85 m/s Ao Root diam: 2.90 cm PV Vmean:       48.900 cm/s                       PV VTI:         0.124 m                       PV Peak grad:   2.9 mmHg                       PV Mean grad:   1.0 mmHg                       RVOT  Peak grad: 6 mmHg   SHUNTS Systemic Diam: 2.00 cm Pulmonic VTI:  0.241 m Donnelly Angelica Electronically signed by Donnelly Angelica Signature Date/Time: 03/18/2022/10:44:10 AM    Final     Disposition Plan & Communication  Patient status: Inpatient  Admitted From: Home Planned disposition location: SNF Anticipated discharge date: pending SNF bed availability.  Family Communication: brother and SIL at bedside    Author: Richarda Osmond, DO [facility] 03/20/2022, 8:02 AM   Available by Epic secure chat 7AM-7PM. If 7PM-7AM, please contact night-coverage.  [facility] contact information found on [email or web address].

## 2022-03-21 DIAGNOSIS — F419 Anxiety disorder, unspecified: Secondary | ICD-10-CM | POA: Diagnosis not present

## 2022-03-21 DIAGNOSIS — D649 Anemia, unspecified: Secondary | ICD-10-CM | POA: Diagnosis not present

## 2022-03-21 DIAGNOSIS — I739 Peripheral vascular disease, unspecified: Secondary | ICD-10-CM

## 2022-03-21 DIAGNOSIS — I70229 Atherosclerosis of native arteries of extremities with rest pain, unspecified extremity: Secondary | ICD-10-CM | POA: Diagnosis not present

## 2022-03-21 DIAGNOSIS — I469 Cardiac arrest, cause unspecified: Secondary | ICD-10-CM | POA: Diagnosis not present

## 2022-03-21 LAB — CBC
HCT: 26 % — ABNORMAL LOW (ref 36.0–46.0)
Hemoglobin: 8.7 g/dL — ABNORMAL LOW (ref 12.0–15.0)
MCH: 29.4 pg (ref 26.0–34.0)
MCHC: 33.5 g/dL (ref 30.0–36.0)
MCV: 87.8 fL (ref 80.0–100.0)
Platelets: 177 10*3/uL (ref 150–400)
RBC: 2.96 MIL/uL — ABNORMAL LOW (ref 3.87–5.11)
RDW: 14.1 % (ref 11.5–15.5)
WBC: 9.3 10*3/uL (ref 4.0–10.5)
nRBC: 0 % (ref 0.0–0.2)

## 2022-03-21 LAB — BASIC METABOLIC PANEL
Anion gap: 5 (ref 5–15)
BUN: 23 mg/dL (ref 8–23)
CO2: 25 mmol/L (ref 22–32)
Calcium: 8.5 mg/dL — ABNORMAL LOW (ref 8.9–10.3)
Chloride: 106 mmol/L (ref 98–111)
Creatinine, Ser: 1.56 mg/dL — ABNORMAL HIGH (ref 0.44–1.00)
GFR, Estimated: 33 mL/min — ABNORMAL LOW (ref >60)
Glucose, Bld: 103 mg/dL — ABNORMAL HIGH (ref 70–99)
Potassium: 4 mmol/L (ref 3.5–5.1)
Sodium: 136 mmol/L (ref 135–145)

## 2022-03-21 LAB — HEPARIN LEVEL (UNFRACTIONATED): Heparin Unfractionated: <0.1 IU/mL — ABNORMAL LOW (ref 0.30–0.70)

## 2022-03-21 NOTE — Progress Notes (Signed)
When doing routine flush of only IV, leaking noted around site and painful when flushed. IV removed intact, small bruise noted to site. Discussed need for new IV to be place in case of needing to give meds emergently. Pt states she does not want a new IV. Discussed with MD who also recommends pt have IV in place. Pt notified and again discussed reasons to have IV in place but also informed pt of her right to refuse any treatment. Pt willing to have IV team come place IV.  IV team RN at bedside and IV placed. Pt states she tolerated well.

## 2022-03-21 NOTE — Progress Notes (Signed)
Pt sitting in chair at 18:30.  RN offered to assist pt with transferring pt back to bed.  At this time, the pt declined and stated that she was waiting for her daughter to visit.  Pts sister at bedside.  BLE groin dressing clean, dry, and intact.

## 2022-03-21 NOTE — Progress Notes (Signed)
MD Brabham order to discontinue Prevena wound vac with seal difficulty.  Wound vac removed this morning.  Minimal scant blood noted. Guaze dressing applied.  Prevena wound vac canisters kept at bedside if needed.  Will continue to monitor.

## 2022-03-21 NOTE — NC FL2 (Signed)
[place] LEVEL OF CARE SCREENING TOOL     IDENTIFICATION  Patient Name: Sabrina [name] Birthdate: [birth year] Sex: female Admission Date (Current Location): 03/17/2022  [place] and [place] Number:  Engineering geologist and Address:  [facility], [address] [place], [place] [address]      Provider Number: [id number]  Attending Physician Name and Address:  Richarda Osmond, MD  Relative Name and Phone Number:  Marguerita Beards (Son)   [phone] [facility])    Current Level of Care: Hospital Recommended Level of Care: [place] Prior Approval Number:    Date Approved/Denied:   PASRR Number:    Discharge Plan:      Current Diagnoses: Patient Active Problem List   Diagnosis Date Noted   PAD (peripheral artery disease) (HCC)    Normocytic anemia    Cardiac arrest ([place])    Respiratory arrest ([place])    Other specified hypothyroidism    Anxiety and depression    Atherosclerosis of artery of extremity with rest pain ([place]) 03/17/2022   LAD (lymphadenopathy), cervical 01/28/2022   Subclavian artery stenosis, left ([place]) 11/20/2021   COVID-19 virus infection 10/04/2021   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, stage III ([place]) 03/47/4259   Lichenified rash 56/38/7564   Insomnia due to anxiety and fear 01/29/2021   Obesity (BMI 30.0-34.9) 11/25/2020   Fatigue 11/25/2020   Acquired hypothyroidism 07/30/2020   Fatigue due to depression 07/26/2020   Encounter for preventive health examination 05/06/2020   Chronic pain syndrome 03/27/2020   Lumbar facet arthropathy 03/27/2020   Pain due to onychomycosis of toenails of both feet 06/18/2019   Callus 06/18/2019   Ankle swelling 04/22/2019   Educated about COVID-19 virus infection 03/21/2019   Anemia of chronic kidney failure, stage 3 (moderate) ([place]) 02/22/2018   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HCC) 08/04/2017   Aortic aneurysm, abdominal  ([place]) 07/05/2017   Chronic breast pain 12/14/2016   Atherosclerosis of renal artery ([place]) 09/09/2016   Carotid stenosis 09/09/2016   Palpitations 06/01/2016   Dizziness 12/04/2015   Cervical spondylosis without myelopathy 08/20/2015   Chronic daily headache 07/20/2015   Statin intolerance 05/20/2015   Chronic radicular lumbar pain 12/24/2014   Lumbar stenosis with neurogenic claudication 12/24/2014   GERD (gastroesophageal reflux disease) 12/06/2014   Chronic epigastric pain 11/14/2014   Chronic right hip pain 11/14/2014   Generalized anxiety disorder 09/08/2014   Peripheral vascular disease ([place]) 08/03/2014   CAD (coronary artery disease) 08/03/2014   HLD (hyperlipidemia) 06/24/2014   Need for prophylactic vaccination and inoculation against influenza 06/24/2014   Osteoarthritis of right hip 04/04/2014   Need for vaccination with 13-polyvalent pneumococcal conjugate vaccine 12/28/2013   Aortic valve disorder 12/28/2013   Hip pain, bilateral 12/27/2013   Essential hypertension 12/27/2013    Orientation RESPIRATION BLADDER Height & Weight     Self, Time, Situation, Place  Normal Continent Weight: 161 lb 9.6 oz (73.3 kg) Height:  5\' 1"  (154.9 cm)  BEHAVIORAL SYMPTOMS/MOOD NEUROLOGICAL BOWEL NUTRITION STATUS      Continent Diet (regular)  AMBULATORY STATUS COMMUNICATION OF NEEDS Skin   Limited Assist Verbally Surgical wounds (incision R &L groin)                       Personal Care Assistance Level of Assistance  Bathing, Feeding, Dressing Bathing Assistance: Limited assistance Feeding assistance: Limited assistance Dressing Assistance: Limited assistance     Functional Limitations Info  SPECIAL CARE FACTORS FREQUENCY  PT (By licensed PT), OT (By licensed OT)     PT Frequency: 5 times per week OT Frequency: 5 times per week            Contractures      Additional Factors Info  Code Status, Allergies Code Status Info: full Allergies Info:  Citalopram, Dilaudid (Hydromorphone Hcl), Hydrochlorothiazide, Liothyronine, Nsaids, Nubain (Nalbuphine Hcl), Penicillins, Prasugrel, Statins           Current Medications (03/21/2022):  This is the current hospital active medication list Current Facility-Administered Medications  Medication Dose Route Frequency Provider Last Rate Last Admin   acetaminophen (TYLENOL) tablet 325-650 mg  325-650 mg Oral Q4H PRN Schnier, [name], MD   325 mg at 03/21/22 0101   Or   acetaminophen (TYLENOL) suppository 325-650 mg  325-650 mg Rectal Q4H PRN Schnier, [name], MD       alum & mag hydroxide-simeth (MAALOX/MYLANTA) 200-200-20 MG/5ML suspension 15-30 mL  15-30 mL Oral Q2H PRN Schnier, [name], MD       aspirin chewable tablet 81 mg  81 mg Oral Daily Tristan Schroeder, PA-C   81 mg at 03/21/22 3267   clopidogrel (PLAVIX) tablet 75 mg  75 mg Oral Daily Katha Cabal, MD   75 mg at 03/21/22 0845   ezetimibe (ZETIA) tablet 10 mg  10 mg Oral Daily Schnier, [name], MD   10 mg at 03/21/22 0845   ferrous sulfate tablet 325 mg  325 mg Oral Q breakfast Richarda Osmond, MD   325 mg at 03/21/22 0845   guaiFENesin-dextromethorphan (ROBITUSSIN DM) 100-10 MG/5ML syrup 15 mL  15 mL Oral Q4H PRN Schnier, [name], MD       hydrALAZINE (APRESOLINE) tablet 100 mg  100 mg Oral TID Katha Cabal, MD   100 mg at 03/21/22 0845   HYDROmorphone (DILAUDID) injection 0.5 mg  0.5 mg Intravenous Q4H PRN Richarda Osmond, MD       irbesartan (AVAPRO) tablet 300 mg  300 mg Oral Daily Schnier, [name], MD   300 mg at 03/21/22 0845   labetalol (NORMODYNE) tablet 300 mg  300 mg Oral BID Katha Cabal, MD   300 mg at 03/21/22 0845   magnesium sulfate IVPB 2 g 50 mL  2 g Intravenous Daily PRN Schnier, [name], MD       menthol-cetylpyridinium (CEPACOL) lozenge 3 mg  1 lozenge Oral PRN Richarda Osmond, MD   3 mg at 03/21/22 1035   nortriptyline (PAMELOR) capsule 10 mg  10 mg Oral BID Katha Cabal, MD   10 mg at 03/21/22 0845   omega-3 acid ethyl esters (LOVAZA) capsule 1,000 mg  1,000 mg Oral BID Schnier, [name], MD   1,000 mg at 03/21/22 0845   ondansetron (ZOFRAN) injection 4 mg  4 mg Intravenous Q6H PRN Schnier, [name], MD       oxyCODONE-acetaminophen (PERCOCET/ROXICET) 5-325 MG per tablet 1-2 tablet  1-2 tablet Oral Q4H PRN Schnier, [name], MD   2 tablet at 03/21/22 0844   phenol (CHLORASEPTIC) mouth spray 1 spray  1 spray Mouth/Throat PRN Schnier, [name], MD       polyethylene glycol (MIRALAX / GLYCOLAX) packet 17 g  17 g Oral BID Richarda Osmond, MD   17 g at 03/21/22 0843   ranolazine (RANEXA) 12 hr tablet 500 mg  500 mg Oral BID Schnier, [name], MD  500 mg at 03/21/22 0845   senna (SENOKOT) tablet 8.6 mg  1 tablet Oral Daily Doristine Mango L, MD   8.6 mg at 03/21/22 0845   sorbitol 70 % solution 30 mL  30 mL Oral Daily PRN Schnier, [name], MD       sorbitol, milk of mag, mineral oil, glycerin (SMOG) enema  960 mL Rectal Once PRN Richarda Osmond, MD       spironolactone (ALDACTONE) tablet 25 mg  25 mg Oral Daily Richarda Osmond, MD   25 mg at 03/21/22 0845     Discharge Medications: Please see discharge summary for a list of discharge medications.  Relevant Imaging Results:  Relevant Lab Results:   Additional Information SS #: [id number]  [place], LCSW

## 2022-03-21 NOTE — Progress Notes (Signed)
[facility] CARDIOLOGY CONSULT NOTE       Patient ID: Sabrina [name] MRN: [id number] DOB/AGE: [birth year] 80 y.o.  Admit date: 03/17/2022 Referring Physician Dr. Flora Lipps Primary Physician Dr. Deborra Medina Primary Cardiologist Dr. Lujean Amel Reason for Consultation cardiopulmonary arrest s/p resuscitation   HPI: Sabrina [name] is an 81yoF with a PMH of CAD s/p CABG x3 with LIMA-LAD, SVG-distal RCA and SVG-ramus intermedius 03/2000 with recent heart cath 11/18/2021 showing extensive native CAD and 100% occlusion SVG-RCA and 90%, distal SVG-OM with significant stenosis of her left subclavian artery, s/p left subclavian stenting on 11/23/2021, HFpEF (LVEF >55%, G1 DD mild-mild AS, mod-severe MS 10/2021), peripheral arterial disease, CKD 3, hypertension, hyperlipidemia, GERD, lumbar stenosis, chronic pain who presented to [facility] on 03/17/2022 for elective bilateral superior femoral artery and left iliac stenting with vascular surgery.  Cardiology is consulted for cardiopulmonary arrest post procedurally. Found to have an markedly elevated troponin at 15000, suggesting a perioperative MI.   Interval History:  - Feels ok today. Somewhat weak.  - No chest pain or shortness of breath.   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 b.) TTE 11/16/2021: EF >55%; mild AS (MPG 9 mmHg)   CAD (coronary artery disease)    a.) s/p 3v CABG 03/29/2000   Carotid artery stenosis    a.) s/p LEFT CEA 09/09/2003. b.) Carotid  doppler 16/38/4665: 9-93% LICA, CTO RICA; subclavian stenosis   Cataracts, bilateral    Cervical spondylosis without myelopathy    Chronic diastolic CHF (congestive heart failure), NYHA class 3 (HCC)    a.) TTE 05/27/2016: EF >55%, mild LA enlargement, triv PR, mild MR, mod TR; G3DD. b.) TTE 12/12/2017: EF >55%, mild LVH, BAE, mild MR/PR, mod TR; RVSP 52.8 mmHg. c.) TTE 03/18/2020: EF >55%, BAD, AS (MPG 8.7 mmHg); triv MR, mild TR/PR. d.) TTE 11/16/2021: EF >55%, LVH, G1DD, triv MR, mild PR, mod TR; AS (MPG 9 mmHg); MS (MPG 5 mmHg)   Chronic kidney disease, stage III (moderate) (HCC)    Chronic narcotic use 06/24/2014   Chronic pain syndrome    GERD (gastroesophageal reflux disease)    History of 2019 novel coronavirus disease (COVID-19) 09/22/2021   Hyperlipidemia    Hypertension    Long term current use of antithrombotics/antiplatelets    a.) on daily DAPT therapy (ASA + clopidogrel)   Lumbar stenosis with neurogenic claudication    Mitral stenosis 11/16/2021   a.) TTE 11/16/2021: EF >55%; mod MS (MPG 5 mmHg)   Osteoarthritis of hip    Pulmonary hypertension ([place]) 12/12/2017   a.) TTE 12/12/2017: mild; RVSP 52.8 mmHg   PVD (peripheral vascular disease) (HCC)    Renal artery stenosis (HCC)    S/P CABG x 3 03/29/2000   a.) 3v CABG: LIMA-LAD, SVG-dRCA, SVG-RI   Secondary hyperparathyroidism (HCC)    SOB (shortness of breath)    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    Past Surgical History:  Procedure Laterality Date   ABDOMINAL HYSTERECTOMY  1976   CAROTID ARTERY ANGIOPLASTY Left    CAROTID ENDARTERECTOMY Left 09/09/2003   Procedure: CAROTID ENDARTERECTOMY; Location: [facility]; Surgeon: Maura Crandall, MD   COLONOSCOPY WITH PROPOFOL N/A 08/16/2017   Procedure: COLONOSCOPY WITH PROPOFOL;  Surgeon: [name], MD;  Location: [facility] ENDOSCOPY;  Service: Endoscopy;  Laterality: N/A;   CORONARY ANGIOPLASTY  WITH STENT PLACEMENT  2000   CORONARY ARTERY BYPASS GRAFT N/A 03/29/2000   Procedure: 3v CORONARY ARTERY BYPASS GRAFT; Location: [facility]   CYSTOSCOPY WITH STENT PLACEMENT Bilateral    ENDARTERECTOMY FEMORAL Bilateral 03/17/2022   Procedure: ENDARTERECTOMY FEMORAL ( BILATERAL SFA STENT);  Surgeon: Katha Cabal, MD;  Location: [facility] ORS;  Service: Vascular;  Laterality: Bilateral;   ESOPHAGOGASTRODUODENOSCOPY (EGD) WITH PROPOFOL N/A 08/16/2017   Procedure: ESOPHAGOGASTRODUODENOSCOPY (EGD) WITH PROPOFOL;  Surgeon: [name], MD;  Location: [facility] ENDOSCOPY;  Service: Endoscopy;  Laterality: N/A;   ESOPHAGOGASTRODUODENOSCOPY (EGD) WITH PROPOFOL N/A 06/29/2018   Procedure: ESOPHAGOGASTRODUODENOSCOPY (EGD) WITH PROPOFOL;  Surgeon: Virgel Manifold, MD;  Location: [facility] ENDOSCOPY;  Service: Endoscopy;  Laterality: N/A;   INSERTION OF ILIAC STENT Left 03/17/2022   Procedure: INSERTION OF ILIAC STENT;  Surgeon: Katha Cabal, MD;  Location: [facility] ORS;  Service: Vascular;  Laterality: Left;   LAPAROSCOPIC CHOLECYSTECTOMY Left 10/26/1999   Procedure: LAPAROSCOPIC CHOLECYSTECTOMY; Location: [facility]; Surgeon: Rochel Brome, MD   LEFT HEART CATH AND CORONARY ANGIOGRAPHY N/A 11/18/2021   Procedure: LEFT HEART CATH AND CORONARY ANGIOGRAPHY;  Surgeon: Yolonda Kida, MD;  Location: [place] CV LAB;  Service: Cardiovascular;  Laterality: N/A;   LEFT HEART CATH AND CORS/GRAFTS ANGIOGRAPHY Left 11/19/2002   Procedure: LEFT HEART CATH AND CORS/GRAFTS ANGIOGRAPHY; Location: [facility]; Surgeon: Katrine Coho, MD   LEFT HEART CATH AND CORS/GRAFTS ANGIOGRAPHY Left 09/17/2003   Procedure: LEFT HEART CATH AND CORS/GRAFTS ANGIOGRAPHY; Location: [place]; Surgeon: Katrine Coho, MD   LOWER EXTREMITY ANGIOGRAPHY Right 01/06/2022   Procedure: Lower Extremity Angiography;  Surgeon: Katha Cabal, MD;  Location: [place] CV LAB;  Service: Cardiovascular;  Laterality: Right;   RENAL ARTERY ANGIOPLASTY  Bilateral 12/2013   TOE AMPUTATION Right    small toe   TONSILLECTOMY AND ADENOIDECTOMY     TOTAL HIP ARTHROPLASTY Left 2005   TOTAL HIP ARTHROPLASTY Right 2015   UPPER EXTREMITY ANGIOGRAPHY Left 11/23/2021   Procedure: UPPER EXTREMITY ANGIOGRAPHY;  Surgeon: Algernon Huxley, MD;  Location: [place] CV LAB;  Service: Cardiovascular;  Laterality: Left;    Medications Prior to Admission  Medication Sig Dispense Refill Last Dose   Ascorbic Acid (VITAMIN C) 500 MG CAPS Take 500 mg by mouth daily. 30 capsule 3 03/16/2022   aspirin 81 MG tablet Take 81 mg by mouth daily.   03/16/2022   butalbital-acetaminophen-caffeine (FIORICET) 50-325-40 MG tablet Take 1 tablet by mouth every 4 (four) hours as needed for headache. 120 tablet 1 03/16/2022   docusate sodium (COLACE) 100 MG capsule Take 1 capsule (100 mg total) by mouth daily as needed for mild constipation or moderate constipation. 30 capsule 3 03/16/2022   ezetimibe (ZETIA) 10 MG tablet Take 1 tablet (10 mg total) by mouth daily. 90 tablet 1 03/17/2022   ferrous sulfate 325 (65 FE) MG EC tablet Take 1 tablet (325 mg total) by mouth 2 (two) times daily with a meal. 60 tablet 3 03/16/2022   hydrALAZINE (APRESOLINE) 100 MG tablet Take 1 tablet (100 mg total) by mouth 3 (three)  times daily. Marland KitchenNOTE DOSE INCREASE TO 100 MG. KEEP ON FILE FOR FUTURE REFILLS 270 tablet 1 03/17/2022   labetalol (NORMODYNE) 300 MG tablet Take 1 tablet (300 mg total) by mouth 2 (two) times daily. For hypertension 180 tablet 1 03/17/2022   nortriptyline (PAMELOR) 10 MG capsule Take 1 capsule (10 mg total) by mouth 2 (two) times daily. For chronic headaches 180 capsule 1 03/17/2022   Omega 3 1000 MG CAPS Take 1,000 mg by mouth 2 (two) times daily.   03/16/2022   pantoprazole (PROTONIX) 40 MG tablet Take 1 tablet (40 mg total) by mouth daily. 30 tablet 5 03/17/2022   ranolazine (RANEXA) 500 MG 12 hr tablet Take 1 tablet (500 mg total) by mouth 2 (two) times daily. 60 tablet 3 03/17/2022    traMADol (ULTRAM) 50 MG tablet Take 1 tablet (50 mg total) by mouth every 6 (six) hours as needed. 120 tablet 5 03/16/2022   valsartan (DIOVAN) 320 MG tablet Take 1 tablet (320 mg total) by mouth daily. 90 tablet 0 03/16/2022   clopidogrel (PLAVIX) 75 MG tablet Take 1 tablet (75 mg total) by mouth daily. To prevent strokes 90 tablet 1 03/11/22   Social History   Socioeconomic History   Marital status: Divorced    Spouse name: Not on file   Number of children: 3   Years of education: Not on file   Highest education level: Not on file  Occupational History   Occupation: retired  Tobacco Use   Smoking status: Former    Types: Cigarettes    Quit date: 10/25/1998    Years since quitting: 23.4   Smokeless tobacco: Never  Vaping Use   Vaping Use: Never used  Substance and Sexual Activity   Alcohol use: No    Alcohol/week: 0.0 standard drinks   Drug use: Never   Sexual activity: Not Currently  Other Topics Concern   Not on file  Social History Narrative   Daughter [name] ([place]); 1 in [employer]; 1 in [place]   Lives alone   Social Determinants of Health   Financial Resource Strain: Not on file  Food Insecurity: Not on file  Transportation Needs: Not on file  Physical Activity: Not on file  Stress: Not on file  Social Connections: Not on file  Intimate Partner Violence: Not on file    Family History  Problem Relation Age of Onset   Stroke Mother    Hypertension Mother    Diabetes Mother    Hypertension Father    Heart disease Sister        MI   Multiple sclerosis Daughter    Multiple sclerosis Son    Cerebral aneurysm Son    Seizures Son    Cerebral aneurysm Son    Breast cancer Paternal Aunt 79      PHYSICAL EXAM General: Pleasant elderly black female, well nourished, in no acute distress.  Laying in ICU bed after transfer from recliner.  HEENT:  Normocephalic and atraumatic. Neck:  No JVD.  Lungs: Normal respiratory effort on room air. Clear bilaterally to  auscultation. No wheezes, crackles, rhonchi.  Heart: HRRR . Normal S1 and S2 without gallops or murmurs. Radial & DP pulses 2+ bilaterally. Chest: Tenderness to palpation of her anterior chest wall Abdomen: Non-distended appearing.  Msk: Normal strength and tone for age. Extremities: Warm and well perfused. No clubbing, cyanosis. No peripheral edema. Wound vacs at bilateral groins Neuro: Alert and oriented X 3. Psych:  Anxious mood. Answers questions appropriately.  Labs:   Lab Results  Component Value Date   WBC 9.3 03/21/2022   HGB 8.7 (L) 03/21/2022   HCT 26.0 (L) 03/21/2022   MCV 87.8 03/21/2022   PLT 177 03/21/2022    Recent Labs  Lab 03/19/22 0449 03/20/22 0425 03/21/22 0346  NA 137   < > 136  K 3.7   < > 4.0  CL 109   < > 106  CO2 23   < > 25  BUN 16   < > 23  CREATININE 1.42*   < > 1.56*  CALCIUM 8.2*   < > 8.5*  PROT 5.8*  --   --   BILITOT 0.8  --   --   ALKPHOS 42  --   --   ALT 40  --   --   AST 68*  --   --   GLUCOSE 104*   < > 103*   < > = values in this interval not displayed.    Lab Results  Component Value Date   CKTOTAL 189 03/17/2022   CKMB 1.8 01/30/2012   TROPONINI 0.04 01/30/2012     Lab Results  Component Value Date   CHOL 206 (H) 01/28/2022   CHOL 205 (H) 07/29/2020   CHOL 167 02/22/2018   Lab Results  Component Value Date   HDL 68 01/28/2022   HDL 49.10 07/29/2020   HDL 54.80 02/22/2018   Lab Results  Component Value Date   LDLCALC 123 (H) 01/28/2022   LDLCALC 130 (H) 07/29/2020   LDLCALC 102 (H) 02/22/2018   Lab Results  Component Value Date   TRIG 61 01/28/2022   TRIG 131.0 07/29/2020   TRIG 53.0 02/22/2018   Lab Results  Component Value Date   CHOLHDL 3.0 01/28/2022   CHOLHDL 4 07/29/2020   CHOLHDL 3 02/22/2018   Lab Results  Component Value Date   LDLDIRECT 109.0 03/05/2016      Radiology: MR Angiogram Head Wo Contrast  Result Date: 03/03/2022 CLINICAL DATA:  Follow-up examination for suspected ICA  aneurysm. EXAM: MRA HEAD WITHOUT CONTRAST TECHNIQUE: Angiographic images of the Circle of Willis were acquired using MRA technique without intravenous contrast. COMPARISON:  Comparison made with recent brain MRI from 02/18/2022. FINDINGS: Anterior circulation: Visualized cervical left ICA widely patent with antegrade flow. Horizontal petrous left ICA widely patent as well. Atheromatous irregularity throughout the left carotid siphon without high-grade stenosis. 2.5 mm saccular outpouching arising from the cavernous left ICA consistent with a small aneurysm (series 5, image 98). This is directed medially. Additional lobulated irregular aneurysm measuring up to 3.8 mm extending posteriorly from the supraclinoid left ICA consistent with a left posterior communicating artery aneurysm (series 5, image 119). This accounts for the abnormality seen on prior brain MRI. The cervical right ICA is largely occluded with only intermittent and severely attenuated flow seen through the petrous segment. Distal reconstitution at the supraclinoid right ICA via collateral flow across the circle-of-Willis and/or the of the left posterior communicating artery. A1 segments patent. Right A1 hypoplastic. Normal anterior communicating artery complex. Both ACAs patent to their distal aspects without stenosis. Both M1 segments patent without stenosis. Normal right MCA bifurcation. There is a 2.4 mm saccular aneurysm arising from the left MCA bifurcation (series 5, image 143). This is directed medially. Distal MCA branches well perfused bilaterally. Posterior circulation: Right vertebral artery dominant and patent to the vertebrobasilar junction without stenosis. Right PICA grossly patent at its origin. Left vertebral artery widely patent as it  courses into the cranial vault. Left PICA patent. There is a focal severe stenosis involving the left V4 segment beyond the takeoff of the left PICA (series 5, image 28). Left V4 segment otherwise  remains patent to the vertebrobasilar junction. Basilar patent to its distal aspect without stenosis. Anterior inferior cerebellar arteries patent. Superior cerebellar arteries patent bilaterally. Both PCA supplied via the basilar as well as bilateral posterior communicating arteries. Focal mild stenosis noted at the origin of the left P1 segment. PCAs otherwise well perfused to their distal aspects. Anatomic variants: Hypoplastic right A1 segment. Dominant right vertebral artery. Other: None. IMPRESSION: 1. 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 2. 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 3. 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 4. 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 5. Severe left V4 stenosis just beyond the takeoff of the left PICA. Dominant right vertebral artery widely patent. Electronically Signed   By: Jeannine Boga M.D.   On: 03/03/2022 06:34   DG C-Arm 1-60 Min-No Report  Result Date: 03/17/2022 Fluoroscopy was utilized by the requesting physician.  No radiographic interpretation.   ECHOCARDIOGRAM COMPLETE  Result Date: 03/18/2022    ECHOCARDIOGRAM REPORT   Patient [facility]:   [name] Date of Exam: 03/18/2022 Medical Rec #:  [id number]          Height:       61.0 in Accession #:    [id number]         Weight:       161.6 lb Date of Birth:  April 17, 1941          BSA:          1.725 m Patient Age:    44 years           BP:           133/64 mmHg Patient Gender: F                  HR:           73 bpm. Exam Location:  [facility] Procedure: 2D Echo, Cardiac Doppler and Color Doppler Indications:     Cardiac arrest I46.9  History:         Patient has no prior history of Echocardiogram examinations.                  CHF, CAD, Prior CABG; Risk Factors:Hypertension.  Sonographer:     Sherrie Sport Referring Phys:  [id number] Flora Lipps Diagnosing Phys: Donnelly Angelica  Sonographer Comments:  Technically challenging study due to limited acoustic windows, no apical window and no subcostal window. IMPRESSIONS  1. Left ventricular ejection fraction, by estimation, is 60 to 65%. The left ventricle has normal function. The left ventricle has no regional wall motion abnormalities. There is moderate left ventricular hypertrophy. Left ventricular diastolic function  could not be evaluated.  2. The mitral valve is abnormal. Trivial mitral valve regurgitation. Moderate mitral annular calcification.  3. The aortic valve is calcified. Aortic valve regurgitation is not visualized. Conclusion(s)/Recommendation(s): Very limited study. Normal LV function. Unable to assess for mitral stensos which previously was moderate to severe. Unable to assess for AS, and no TR velocity obtained to assess RVSP. FINDINGS  Left Ventricle: Left ventricular ejection fraction, by estimation, is 60 to 65%. The left ventricle has normal function. The left ventricle has no regional wall motion abnormalities. The left ventricular internal cavity size  was normal in size. There is  moderate left ventricular hypertrophy. Left ventricular diastolic function could not be evaluated. Pericardium: There is no evidence of pericardial effusion. Mitral Valve: The mitral valve is abnormal. Moderate mitral annular calcification. Trivial mitral valve regurgitation. Tricuspid Valve: The tricuspid valve is normal in structure. Tricuspid valve regurgitation is mild. Aortic Valve: The aortic valve is calcified. Aortic valve regurgitation is not visualized. Pulmonic Valve: The pulmonic valve was not well visualized. Pulmonic valve regurgitation is not visualized. No evidence of pulmonic stenosis. Aorta: The aortic root is normal in size and structure.  LEFT VENTRICLE PLAX 2D LVIDd:         3.80 cm LVIDs:         2.10 cm LV PW:         1.30 cm LV IVS:        1.30 cm LVOT diam:     2.00 cm LVOT Area:     3.14 cm  LEFT ATRIUM         Index LA diam:    3.90 cm  2.26 cm/m                        PULMONIC VALVE AORTA                 PV Vmax:        0.85 m/s Ao Root diam: 2.90 cm PV Vmean:       48.900 cm/s                       PV VTI:         0.124 m                       PV Peak grad:   2.9 mmHg                       PV Mean grad:   1.0 mmHg                       RVOT Peak grad: 6 mmHg   SHUNTS Systemic Diam: 2.00 cm Pulmonic VTI:  0.241 m Donnelly Angelica Electronically signed by Donnelly Angelica Signature Date/Time: 03/18/2022/10:44:10 AM    Final     ECHO 11/16/2021 ECHOCARDIOGRAPHIC MEASUREMENTS  2D DIMENSIONS  AORTA                  Values   Normal Range   MAIN PA         Values    Normal Range                Annulus: 1.5 cm       [2.1-2.5]         PA Main: nm*       [1.5-2.1]              Aorta Sin: nm*          [2.7-3.3]    RIGHT VENTRICLE            ST Junction: nm*          [2.3-2.9]         RV Base: nm*       [<4.2]              Asc.Aorta: 2.6 cm       [2.3-3.1]  RV Mid: 3.0 cm    [<3.5]  LEFT VENTRICLE                                      RV Length: nm*       [<8.6]                  LVIDd: 3.7 cm       [3.9-5.3]    INFERIOR VENA CAVA                  LVIDs: 2.5 cm                        Max. IVC: nm*       [<=2.1]                     FS: 33.1 %       [>25]            Min. IVC: nm*                    SWT: 1.2 cm       [0.5-0.9]    ------------------                    PWT: 1.1 cm       [0.5-0.9]    nm* - not measured  LEFT ATRIUM                LA Diam: 4.2 cm       [2.7-3.8]            LA A4C Area: nm*          [<20]              LA Volume: nm*          [22-52]  _________________________________________________________________________________________  ECHOCARDIOGRAPHIC DESCRIPTIONS  AORTIC ROOT                   Size: Normal             Dissection: INDETERM FOR DISSECTION  AORTIC VALVE               Leaflets: Tricuspid                   Morphology: MILDLY THICKENED               Mobility: Fully mobile  LEFT VENTRICLE                    Size: Normal                        Anterior: Normal            Contraction: Normal                         Lateral: Normal             Closest EF: >55% (Estimated)                Septal: Normal              LV Masses: No Masses                       Apical: Normal  LVH: MILD LVH                      Inferior: Normal                                                      Posterior: Normal           Dias.FxClass: (Grade 1) relaxation abnormal, E/A reversal  MITRAL VALVE               Leaflets: Normal                        Mobility: PARTIALLY MOBILE             Morphology: THICKENED LEAFLET(S)  LEFT ATRIUM                   Size: MILDLY ENLARGED              LA Masses: No masses              IA Septum: Normal IAS  MAIN PA                   Size: Normal  PULMONIC VALVE             Morphology: Normal                        Mobility: Fully mobile  RIGHT VENTRICLE              RV Masses: No Masses                         Size: Normal              Free Wall: Normal                     Contraction: Normal  TRICUSPID VALVE               Leaflets: Normal                        Mobility: Fully mobile             Morphology: Normal  RIGHT ATRIUM                   Size: Normal                        RA Other: None                RA Mass: No masses  PERICARDIUM                  Fluid: No effusion  INFERIOR VENACAVA                   Size: Normal Normal respiratory collapse  _________________________________________________________________________________________   DOPPLER ECHO and OTHER SPECIAL PROCEDURES                 Aortic: No AR                      TRIVIAL AS  207.0 cm/sec peak vel      17.1 mmHg peak grad                         9.0 mmHg mean grad                 Mitral: TRIVIAL MR                 MODERATE MS                         164.0 cm/sec peak vel      10.8 mmHg peak grad                         5.0 mmHg mean grad                          MV Inflow E Vel = 151.0 cm/sec      MV Annulus E'Vel = 8.8 cm/sec                         E/E'Ratio = 17.2              Tricuspid: MODERATE TR                No TS                         330.0 cm/sec peak TR vel   53.6 mmHg peak RV pressure              Pulmonary: MILD PR                    No PS                         111.0 cm/sec peak vel      4.9 mmHg peak grad  _________________________________________________________________________________________  INTERPRETATION  NORMAL LEFT VENTRICULAR SYSTOLIC FUNCTION   WITH MILD LVH  NORMAL RIGHT VENTRICULAR SYSTOLIC FUNCTION  NLG(XQJ)=.19ER^7  MVA(VTI)=1.17cm^2  MODERATE TR  MODERATE MS  TRIVIAL AS  TRIVIAL MR  MILD PR  EF >55%  Mild/Mod AS  Mod /Severe MS   TELEMETRY reviewed by me: sinus rhythm 81 with 3 beats of NSVT  ASSESSMENT AND PLAN:  Sabrina [name] is an 77yoF with a PMH of CAD s/p CABG x3 with LIMA-LAD, SVG-distal RCA and SVG-ramus intermedius 03/2000 with recent heart cath 11/18/2021 showing extensive native CAD and 100% occlusion SVG-RCA and 90%, distal SVG-OM with significant stenosis of her left subclavian artery, s/p left subclavian stenting on 11/23/2021, HFpEF (LVEF >55%, G1 DD mild-mild AS, mod-severe MS 10/2021), peripheral arterial disease, CKD 3, hypertension, hyperlipidemia, GERD, lumbar stenosis, chronic pain who presented to [facility] on 03/17/2022 for elective bilateral superior femoral artery and left iliac stenting with vascular surgery.  Cardiology is consulted for cardiopulmonary arrest post procedurally. Found to have an markedly elevated troponin at 15000, suggesting a perioperative MI.   #Cardiopulmonary arrest s/p successful resuscitation likely 2/2 perioperative NSTEMI  #s/p bilateral superior femoral and left iliac endarterectomies & stenting 03/17/22 #h/o left subclavian artery stenting 11/23/21 #CAD s/p three-vessel CABG  The patient presented for elective vascular surgery on 5/25 and she tolerated the long  procedure well.  However, in the PACU postextubation she drank some  water and had what was thought to be a bradycardic/vagal cardiopulmonary arrest from possibly aspiration, was intubated for 1 hour in the ICU, and was successfully extubated.  Initial troponin was checked at the time of the event was 9, repeat waswas markedly elevated at 15,000 about 18 hrs later and EKG shows new lateral ST depressions, suggesting a perioperative NSTEMI. Currently denies any chest pain but does have extensive CAD with known occlusions of her multiple vein grafts, in addition to CKD 3, and limited vascular access. Not having any pain.  -Continue supportive postoperative care per vascular surgery and primary -Continue DAPT with aspirin 99m and Plavix 753mdaily for at least 12 months -s/p heparin x 48 hrs -Continue Ranexa 500 mg twice daily -Echocardiogram complete was a technically very limited study but does show preserved LVEF 60-65% without regional wall motion abnormalities, moderate MR which is similar to prior echo, but the aortic valve was unable to be assessed for progression of her known aortic stenosis. -We will continue conservative medical management for now with further discussions regarding potential cardiac catheterization during this hospitalization or an outpatient basis based on her clinical course and recovery from her procedure. She continues to have anemia, and needed blood transfusion 5/27; may make outpatient basis more appealing.  #Hypertension Continue home medications which include hydralazine 100 mg 3 times daily, Ibresartan 300 mg once daily labetalol 300 mg twice daily  #CKD 3 Renal function around baseline with creatinine 1.49, EGFR 35 .   Signed: RYAndrez Grime MD 03/21/2022, 8:18 AM [facility]

## 2022-03-21 NOTE — TOC Progression Note (Signed)
Transition of Care [facility]) - Progression Note    Patient Details  Name: Tahnee ADREONA BRAND MRN: [id number] Date of Birth: 1941/06/26  [facility] CM/SW Contact  Izola Price, RN Phone Number: 03/21/2022, 6:06 PM  Clinical Narrative:  5/28: Spoke with patient with daughter from [place] in the room. Explained bed search, looked up website on pt's iPad. First choice is [facility] w permission to start broad search as well. Will look at website for other choices. Indicated Weekday CM would follow up or if has more questions. [facility] would not accept SS or DOB, FL2 done except for that. Simmie Davies RN CM          Expected Discharge Plan and Services                                                 Social Determinants of Health (SDOH) Interventions    Readmission Risk Interventions     View : No data to display.

## 2022-03-21 NOTE — Progress Notes (Signed)
Physical Therapy Treatment Patient Details Name: Sabrina [name] MRN: [id number] DOB: 1941/06/04 Today's Date: 03/21/2022   History of Present Illness Pt is an 81 y/o F admitted on 03/17/22 after presenting to hospital for BLE endarterectomy for tx of severe PAD & claudication. Post procedure pt experienced acute cardiac arrest & placed on MV support, extubated on 5/24.  PMH: PAD, HTN, HLD, PVD, CAD s/p CABG, aortic stenosis, pulmonary HTN, GAD, chronic hip pain, GERD, hypothyroidism, CKD3    PT Comments    Pt in bathroom bathing with daughter upon arrival.  Assisted with finishing task.  She is able to stand with min a x 1 and walk back to recliner with min a x 1.  Overall improvement in mobility today. She is fatigued with tasks but stays in recliner with encouragement.  Review and demo of HEP in supine and seated positions.  Daughter in and will encourage pt during the day.    Recommendations for follow up therapy are one component of a multi-disciplinary discharge planning process, led by the attending physician.  Recommendations may be updated based on patient status, additional functional criteria and insurance authorization.  Follow Up Recommendations  Skilled nursing-short term rehab (<3 hours/day)     Assistance Recommended at Discharge Frequent or constant Supervision/Assistance  Patient can return home with the following A little help with walking and/or transfers;A little help with bathing/dressing/bathroom;Assistance with cooking/housework;Assist for transportation;Help with stairs or ramp for entrance   Equipment Recommendations  Wheelchair (measurements PT);Wheelchair cushion (measurements PT);BSC/3in1    Recommendations for Other Services       Precautions / Restrictions Precautions Precautions: Fall Restrictions Weight Bearing Restrictions: No Other Position/Activity Restrictions: pt with BLE groin wound vacs     Mobility  Bed Mobility                General bed mobility comments: on commode and remained in chair after session    Transfers Overall transfer level: Needs assistance Equipment used: Rolling walker (2 wheels) Transfers: Sit to/from Stand Sit to Stand: Min assist                Ambulation/Gait Ambulation/Gait assistance: Min assist Gait Distance (Feet): 10 Feet Assistive device: Rolling walker (2 wheels) Gait Pattern/deviations: Step-through pattern, Decreased step length - right, Decreased step length - left Gait velocity: decreased     General Gait Details: overall does well with walker today.  likely could have walked further but was limited by fatigue from bathing prior on commode in bathroom   Stairs             Wheelchair Mobility    Modified Rankin (Stroke Patients Only)       Balance Overall balance assessment: Needs assistance Sitting-balance support: Feet unsupported, Bilateral upper extremity supported Sitting balance-Leahy Scale: Fair     Standing balance support: Bilateral upper extremity supported, During functional activity Standing balance-Leahy Scale: Fair                              Cognition Arousal/Alertness: Awake/alert Behavior During Therapy: WFL for tasks assessed/performed Overall Cognitive Status: Within Functional Limits for tasks assessed                                          Exercises      General Comments  Pertinent Vitals/Pain Pain Assessment Pain Assessment: Faces Faces Pain Scale: Hurts little more Pain Location: lower belly & BLE groin Pain Descriptors / Indicators: Discomfort Pain Intervention(s): Limited activity within patient's tolerance, Repositioned    Home Living                          Prior Function            PT Goals (current goals can now be found in the care plan section) Progress towards PT goals: Progressing toward goals    Frequency    Min 2X/week      PT Plan  Current plan remains appropriate    Co-evaluation              AM-PAC PT "6 Clicks" Mobility   Outcome Measure  Help needed turning from your back to your side while in a flat bed without using bedrails?: A Lot Help needed moving from lying on your back to sitting on the side of a flat bed without using bedrails?: A Lot Help needed moving to and from a bed to a chair (including a wheelchair)?: A Little Help needed standing up from a chair using your arms (e.g., wheelchair or bedside chair)?: A Little Help needed to walk in hospital room?: A Little Help needed climbing 3-5 steps with a railing? : A Lot 6 Click Score: 15    End of Session Equipment Utilized During Treatment: Gait belt Activity Tolerance: Patient tolerated treatment well;Patient limited by pain;Patient limited by fatigue Patient left: in chair;with call bell/phone within reach;with chair alarm set;with family/visitor present Nurse Communication: Mobility status PT Visit Diagnosis: Difficulty in walking, not elsewhere classified (R26.2);Unsteadiness on feet (R26.81);Muscle weakness (generalized) (M62.81);Pain     Time: 7915-0569 PT Time Calculation (min) (ACUTE ONLY): 14 min  Charges:  $Gait Training: 8-22 mins                   Chesley Noon, PTA 03/21/22, 11:09 AM

## 2022-03-21 NOTE — Progress Notes (Signed)
PROGRESS NOTE  Sabrina [name]    DOB: [birth year], 81 y.o.  HWE:[id number]    Code Status: Full Code   DOA: 03/17/2022   LOS: 4   Brief hospital course  Sabrina [name] is a 81 y.o. female with a PMH significant for PAD, HTN, HLD, PVD, CAD s/p CABG, aortic stenosis, pulmonary HTN, GAD, chronic hip pain, GERD, hypothyroidism, CKD III.  They presented from home to the [facility] on 03/17/2022 for bilateral lower extremity endarterectomy in treatment of severe PAD and claudication.  5/24 b/l femoral endarterectomy and stenting 5/24 post procedure, experienced acute cardiac arrest, placed on MV support 5/24 extubated 5/25 identified ACS, started on heparin gtt 5/27 heparin drip stopped. 1 unit pRBCs transfused  03/21/22 -stable, ready for dc to SNF when bed available.   Assessment & Plan  Principal Problem:   Atherosclerosis of artery of extremity with rest pain [facility]) Active Problems:   Cardiac arrest ([place])   Respiratory arrest ([place])   Other specified hypothyroidism   Anxiety and depression   Normocytic anemia  Severe ACUTE Hypoxic and Hypercapnic Respiratory Failure- resolved. s/p intubation. She is stable ORA without respiratory symptoms   CARDIAC FAILURE-NSTEMI/CARDIAC ARREST- stable. Troponins elevated to 15,682. She is endorsing improvement of pain of chest wall - cardiology consulted, appreciate recs  - likely cath outpatient - discontinued heparin gtt 5/27 and no reoccurrence of chest pain - PT/OT recommending SNF and patient is agreeable   ACUTE KIDNEY INJURY on CKDIII- appears to be close to baseline. Cr 1.49>1.42>1.5 today - BMP am  Constipation- patient endorses 7 days since last BM. Denies any abdominal discomfort. This is chronic for patient - miralax BID - senna daily - SMOG PRN  PAD- s/p endarterectomy b/l LE. Wound vacs in place - vascular surgery consulted, appreciate recs  CAD s/p CABG  HLD  HTN- poorly controlled HTN on home medications.  - added on  spironolactone 5/26 and Bps have been better controlled - continue ASA, plavix, zetia - continue home antihypertensives  Anemia- transfusion threshold 8 hgb 7.5>9.5 s/p 1 unit pRBCs on 5/27. 8.7 today. No known acute bleed. - CBC am  Hypothyroidism-  - continue home levothyroxine  Anxiety/depression- chronic - continue home medications  Body mass index is 30.53 kg/m.  VTE ppx: TED hose Start: 03/17/22 1428   Diet:     Diet   Diet regular Room service appropriate? Yes; Fluid consistency: Thin   Consultants: Cardiology CCM Vascular surgery Subjective 03/21/22    Pt reports feeling well today. Pain is improving. Still no BM. She denies abdominal discomfort. Agreeable to SNF and wishes to speak with TOC today.   Objective   Vitals:   03/20/22 1943 03/20/22 2128 03/20/22 2343 03/21/22 0353  BP: (!) 130/55 (!) 127/45 (!) 110/50 (!) 132/49  Pulse: 73  67 65  Resp: 16 16 16 18   Temp: 98.4 F (36.9 C)  97.9 F (36.6 C) 97.9 F (36.6 C)  TempSrc:      SpO2: 97%  97% 98%  Weight:      Height:        Intake/Output Summary (Last 24 hours) at 03/21/2022 0742 Last data filed at 03/21/2022 0640 Gross per 24 hour  Intake 529.71 ml  Output 50 ml  Net 479.71 ml    Filed Weights   03/17/22 0631 03/17/22 1428  Weight: 71.7 kg 73.3 kg     Physical Exam:  General: awake, alert, NAD HEENT: atraumatic, clear conjunctiva, anicteric sclera, MMM, hearing grossly normal  Respiratory: normal respiratory effort. Cardiovascular: quick capillary refill, normal S1/S2, RRR, no JVD, murmurs Gastrointestinal: soft, NT, ND Nervous: A&O x3. no gross focal neurologic deficits, normal speech Extremities: moves all equally, no edema, normal tone Skin: dry, intact, normal temperature, normal color. No rashes, lesions or ulcers on exposed skin. Wound vacs bilateral groins intact, no drainage Psychiatry: anxious mood, congruent affect  Labs   I have personally reviewed the following labs  and imaging studies CBC    Component Value Date/Time   WBC 9.3 03/21/2022 0346   RBC 2.96 (L) 03/21/2022 0346   HGB 8.7 (L) 03/21/2022 0346   HGB 9.2 (L) 04/13/2014 0952   HCT 26.0 (L) 03/21/2022 0346   HCT 24.7 (L) 04/12/2014 1143   PLT 177 03/21/2022 0346   PLT 182 04/11/2014 0558   MCV 87.8 03/21/2022 0346   MCV 85 03/27/2014 0944   MCH 29.4 03/21/2022 0346   MCHC 33.5 03/21/2022 0346   RDW 14.1 03/21/2022 0346   RDW 13.7 03/27/2014 0944   LYMPHSABS 1.2 03/17/2022 1406   MONOABS 0.1 03/17/2022 1406   EOSABS 0.0 03/17/2022 1406   BASOSABS 0.0 03/17/2022 1406      Latest Ref Rng & Units 03/21/2022    3:46 AM 03/20/2022    4:25 AM 03/19/2022    4:49 AM  BMP  Glucose 70 - 99 mg/dL 103   106   104    BUN 8 - 23 mg/dL 23   19   16     Creatinine 0.44 - 1.00 mg/dL 1.56   1.50   1.42    Sodium 135 - 145 mmol/L 136   135   137    Potassium 3.5 - 5.1 mmol/L 4.0   3.7   3.7    Chloride 98 - 111 mmol/L 106   105   109    CO2 22 - 32 mmol/L 25   24   23     Calcium 8.9 - 10.3 mg/dL 8.5   8.4   8.2      No results found.  Disposition Plan & Communication  Patient status: Inpatient  Admitted From: Home Planned disposition location: SNF Anticipated discharge date: pending SNF bed availability.  Family Communication: daughter and SIL at bedside    Author: Richarda Osmond, DO [facility] 03/21/2022, 7:42 AM   Available by Epic secure chat 7AM-7PM. If 7PM-7AM, please contact night-coverage.  [facility] contact information found on [email or web address].

## 2022-03-22 DIAGNOSIS — D649 Anemia, unspecified: Secondary | ICD-10-CM | POA: Diagnosis not present

## 2022-03-22 DIAGNOSIS — I70229 Atherosclerosis of native arteries of extremities with rest pain, unspecified extremity: Secondary | ICD-10-CM | POA: Diagnosis not present

## 2022-03-22 DIAGNOSIS — F419 Anxiety disorder, unspecified: Secondary | ICD-10-CM | POA: Diagnosis not present

## 2022-03-22 DIAGNOSIS — I469 Cardiac arrest, cause unspecified: Secondary | ICD-10-CM | POA: Diagnosis not present

## 2022-03-22 LAB — CBC
HCT: 28.8 % — ABNORMAL LOW (ref 36.0–46.0)
Hemoglobin: 9.2 g/dL — ABNORMAL LOW (ref 12.0–15.0)
MCH: 28.7 pg (ref 26.0–34.0)
MCHC: 31.9 g/dL (ref 30.0–36.0)
MCV: 89.7 fL (ref 80.0–100.0)
Platelets: 239 10*3/uL (ref 150–400)
RBC: 3.21 MIL/uL — ABNORMAL LOW (ref 3.87–5.11)
RDW: 13.7 % (ref 11.5–15.5)
WBC: 9.3 10*3/uL (ref 4.0–10.5)
nRBC: 0 % (ref 0.0–0.2)

## 2022-03-22 LAB — TYPE AND SCREEN
ABO/RH(D): B POS
Antibody Screen: NEGATIVE
Unit division: 0

## 2022-03-22 LAB — BPAM RBC
Blood Product Expiration Date: 202306142359
ISSUE DATE / TIME: 202305270942
Unit Type and Rh: 7300

## 2022-03-22 MED ORDER — BISACODYL 10 MG RE SUPP
10.0000 mg | Freq: Once | RECTAL | Status: AC
  Administered 2022-03-22: 10 mg via RECTAL
  Filled 2022-03-22: qty 1

## 2022-03-22 MED ORDER — MAGNESIUM HYDROXIDE 400 MG/5ML PO SUSP
15.0000 mL | Freq: Every day | ORAL | Status: DC | PRN
  Administered 2022-03-22 – 2022-03-23 (×2): 15 mL via ORAL
  Filled 2022-03-22 (×2): qty 30

## 2022-03-22 NOTE — Progress Notes (Signed)
Last BM prior to admit. Pt getting miralax and senna last couple days.  Pt feeling urge to have BM - assisted to walk to bathroom with walker 2 times within an hour; passing gas but unable to have BM.  Educated pt on concern of no BM in 6 days and risks of bowel obstruction. Discussed doing PRN SMOG with pt but pt requesting to try suppository before doing enema. NP notified and suppository given per order.  Pt assisted to [facility] - still passing gas but no BM. Denies abd pain or nausea. Pt wants to sleep a while then try SMOG in morning.  Around 530 gave small amt of smog enema in bed then pt with strong urge to have BM. Assisted to [facility] and had small hard pellets of stool. Pt denies further urge for BM at this time and wants to wait a while before trying more smog enema.

## 2022-03-22 NOTE — Progress Notes (Signed)
PROGRESS NOTE  Sabrina [name]    DOB: [birth year], 81 y.o.  WIO:[id number]    Code Status: Full Code   DOA: 03/17/2022   LOS: 5   Brief hospital course  Sabrina [name] is a 81 y.o. female with a PMH significant for PAD, HTN, HLD, PVD, CAD s/p CABG, aortic stenosis, pulmonary HTN, GAD, chronic hip pain, GERD, hypothyroidism, CKD III.  They presented from home to the [facility] on 03/17/2022 for bilateral lower extremity endarterectomy in treatment of severe PAD and claudication.  5/24 b/l femoral endarterectomy and stenting 5/24 post procedure, experienced acute cardiac arrest, placed on MV support 5/24 extubated 5/25 identified ACS, started on heparin gtt 5/27 heparin drip stopped. 1 unit pRBCs transfused  03/22/22 -stable, ready for dc to SNF when bed available.   Assessment & Plan  Principal Problem:   Atherosclerosis of artery of extremity with rest pain [facility]) Active Problems:   Cardiac arrest ([place])   Respiratory arrest ([place])   Other specified hypothyroidism   Anxiety and depression   Normocytic anemia   PAD (peripheral artery disease) ([facility])  Severe ACUTE Hypoxic and Hypercapnic Respiratory Failure- resolved. s/p intubation. She is stable ORA without respiratory symptoms   CARDIAC FAILURE-NSTEMI/CARDIAC ARREST- stable. Troponins elevated to 15,682. She is endorsing improvement of pain of chest wall - cardiology consulted, appreciate recs  - likely cath outpatient - discontinued heparin gtt 5/27 and no reoccurrence of chest pain - PT/OT recommending SNF and patient is agreeable   ACUTE KIDNEY INJURY on CKDIII- appears to be close to baseline. Cr 1.49>1.42>1.5 - BMP am  Constipation- had small BM after incomplete enema yesterday. Denies any abdominal discomfort. This is chronic for patient. Asked for MOM as this typically helps her - MOM daily PRN - miralax BID - senna daily - SMOG PRN  PAD- s/p endarterectomy b/l LE. Wound vacs removed - vascular surgery consulted,  appreciate recs  CAD s/p CABG  HLD  HTN- poorly controlled HTN on home medications.  - added on spironolactone 5/26 and Bps have been better controlled - continue ASA, plavix, zetia - continue home antihypertensives  Anemia- transfusion threshold 8 hgb 7.5>9.5>9.2 s/p 1 unit pRBCs on 5/27. No known acute bleed. - CBC am  Hypothyroidism-  - continue home levothyroxine  Anxiety/depression- chronic - continue home medications  Body mass index is 30.53 kg/m.  VTE ppx: TED hose Start: 03/17/22 1428   Diet:     Diet   Diet regular Room service appropriate? Yes; Fluid consistency: Thin   Consultants: Cardiology CCM Vascular surgery Subjective 03/22/22    Pt reports doing well today. She complains of abnormal sensation when she drinks water. Has no difficulty drinking any other form of liquid or food. Just water. I told her I'd ask speech therapy to see her again today.  Objective   Vitals:   03/21/22 2209 03/22/22 0011 03/22/22 0315 03/22/22 0533  BP: (!) 130/49 (!) 125/46  108/80  Pulse:  66  73  Resp: 17 20 14 18   Temp:  98.2 F (36.8 C)  98.2 F (36.8 C)  TempSrc:      SpO2:  97%  100%  Weight:      Height:        Intake/Output Summary (Last 24 hours) at 03/22/2022 0738 Last data filed at 03/21/2022 1349 Gross per 24 hour  Intake 300 ml  Output 600 ml  Net -300 ml    Filed Weights   03/17/22 0631 03/17/22 1428  Weight: 71.7 kg  73.3 kg     Physical Exam:  General: awake, alert, NAD HEENT: atraumatic, clear conjunctiva, anicteric sclera, MMM, hearing grossly normal Respiratory: normal respiratory effort. Cardiovascular: quick capillary refill, normal S1/S2, RRR, no JVD, murmurs Gastrointestinal: soft, NT, ND Nervous: A&O x3. no gross focal neurologic deficits, normal speech Extremities: moves all equally, no edema, normal tone Skin: dry, intact, normal temperature, normal color. No rashes, lesions or ulcers on exposed skin. Psychiatry: calm mood,  congruent affect  Labs   I have personally reviewed the following labs and imaging studies CBC    Component Value Date/Time   WBC 9.3 03/22/2022 0441   RBC 3.21 (L) 03/22/2022 0441   HGB 9.2 (L) 03/22/2022 0441   HGB 9.2 (L) 04/13/2014 0952   HCT 28.8 (L) 03/22/2022 0441   HCT 24.7 (L) 04/12/2014 1143   PLT 239 03/22/2022 0441   PLT 182 04/11/2014 0558   MCV 89.7 03/22/2022 0441   MCV 85 03/27/2014 0944   MCH 28.7 03/22/2022 0441   MCHC 31.9 03/22/2022 0441   RDW 13.7 03/22/2022 0441   RDW 13.7 03/27/2014 0944   LYMPHSABS 1.2 03/17/2022 1406   MONOABS 0.1 03/17/2022 1406   EOSABS 0.0 03/17/2022 1406   BASOSABS 0.0 03/17/2022 1406      Latest Ref Rng & Units 03/21/2022    3:46 AM 03/20/2022    4:25 AM 03/19/2022    4:49 AM  BMP  Glucose 70 - 99 mg/dL 103   106   104    BUN 8 - 23 mg/dL 23   19   16     Creatinine 0.44 - 1.00 mg/dL 1.56   1.50   1.42    Sodium 135 - 145 mmol/L 136   135   137    Potassium 3.5 - 5.1 mmol/L 4.0   3.7   3.7    Chloride 98 - 111 mmol/L 106   105   109    CO2 22 - 32 mmol/L 25   24   23     Calcium 8.9 - 10.3 mg/dL 8.5   8.4   8.2      No results found.  Disposition Plan & Communication  Patient status: Inpatient  Admitted From: Home Planned disposition location: SNF Anticipated discharge date: pending SNF bed availability.  Family Communication: none Author: Richarda Osmond, DO [facility] 03/22/2022, 7:38 AM   Available by Epic secure chat 7AM-7PM. If 7PM-7AM, please contact night-coverage.  [facility] contact information found on [email or web address].

## 2022-03-22 NOTE — Progress Notes (Signed)
    Subjective  - POD #5  No complaints today Provena vacs removed due to lack of seal   Physical Exam:  Groin dressings dry Medial knee numbness       Assessment/Plan:  POD #5  Continues to improve DAPT ECHO  with EF 60-65%, likely cath as outpatient  Sabrina [name] 03/22/2022 1:32 PM --  Vitals:   03/22/22 0843 03/22/22 1208  BP: (!) 132/49 (!) 137/55  Pulse: 73 71  Resp: 18 18  Temp: 98.9 F (37.2 C) 99.3 F (37.4 C)  SpO2: 95% 100%    Intake/Output Summary (Last 24 hours) at 03/22/2022 1332 Last data filed at 03/22/2022 0845 Gross per 24 hour  Intake 480 ml  Output --  Net 480 ml     Laboratory CBC    Component Value Date/Time   WBC 9.3 03/22/2022 0441   HGB 9.2 (L) 03/22/2022 0441   HGB 9.2 (L) 04/13/2014 0952   HCT 28.8 (L) 03/22/2022 0441   HCT 24.7 (L) 04/12/2014 1143   PLT 239 03/22/2022 0441   PLT 182 04/11/2014 0558    BMET    Component Value Date/Time   NA 136 03/21/2022 0346   NA 139 09/13/2014 0000   NA 134 (L) 04/12/2014 0558   K 4.0 03/21/2022 0346   K 4.4 04/12/2014 0558   CL 106 03/21/2022 0346   CL 101 04/12/2014 0558   CO2 25 03/21/2022 0346   CO2 27 04/12/2014 0558   GLUCOSE 103 (H) 03/21/2022 0346   GLUCOSE 90 04/12/2014 0558   BUN 23 03/21/2022 0346   BUN 18 09/13/2014 0000   BUN 16 04/12/2014 0558   CREATININE 1.56 (H) 03/21/2022 0346   CREATININE 1.12 04/12/2014 0558   CALCIUM 8.5 (L) 03/21/2022 0346   CALCIUM 8.5 04/12/2014 0558   GFRNONAA 33 (L) 03/21/2022 0346   GFRNONAA 49 (L) 04/12/2014 0558   GFRAA 50 (L) 03/10/2020 1311   GFRAA 57 (L) 04/12/2014 0558    COAG Lab Results  Component Value Date   INR 1.0 03/27/2014   No results found for: PTT  Antibiotics Anti-infectives (From admission, onward)    Start     Dose/Rate Route Frequency Ordered Stop   03/17/22 2000  vancomycin (VANCOCIN) IVPB 1000 mg/200 mL premix        1,000 mg 200 mL/hr over 60 Minutes Intravenous Every 12 hours 03/17/22  1428 03/18/22 1010   03/17/22 0621  vancomycin (VANCOCIN) 1-5 GM/200ML-% IVPB       Note to Pharmacy: Lyman Bishop T: cabinet override      03/17/22 0621 03/17/22 0815   03/17/22 0600  vancomycin (VANCOCIN) IVPB 1000 mg/200 mL premix        1,000 mg 200 mL/hr over 60 Minutes Intravenous On call to O.R. 03/16/22 2345 03/17/22 0854        V. Leia Alf, M.D., [facility] [place] Office: [phone] Pager:  [phone]

## 2022-03-22 NOTE — Progress Notes (Signed)
[facility] Card[facility]  SUBJECTIVE: Patient laying in bed, denies chest pain or shortness of breath   Vitals:   03/22/22 0011 03/22/22 0315 03/22/22 0533 03/22/22 0843  BP: (!) 125/46  108/80 (!) 132/49  Pulse: 66  73 73  Resp: 20 14 18 18   Temp: 98.2 F (36.8 C)  98.2 F (36.8 C) 98.9 F (37.2 C)  TempSrc:    Oral  SpO2: 97%  100% 95%  Weight:      Height:         Intake/Output Summary (Last 24 hours) at 03/22/2022 3335 Last data filed at 03/21/2022 1349 Gross per 24 hour  Intake 300 ml  Output 600 ml  Net -300 ml      PHYSICAL EXAM  General: Well developed, well nourished, in no acute distress HEENT:  Normocephalic and atramatic Neck:  No JVD.  Lungs: Clear bilaterally to auscultation and percussion. Heart: HRRR . Normal S1 and S2 without gallops or murmurs.  Abdomen: Bowel sounds are positive, abdomen soft and non-tender  Msk:  Back normal, normal gait. Normal strength and tone for age. Extremities: No clubbing, cyanosis or edema.   Neuro: Alert and oriented X 3. Psych:  Good affect, responds appropriately   LABS: Basic Metabolic Panel: Recent Labs    03/20/22 0425 03/21/22 0346  NA 135 136  K 3.7 4.0  CL 105 106  CO2 24 25  GLUCOSE 106* 103*  BUN 19 23  CREATININE 1.50* 1.56*  CALCIUM 8.4* 8.5*   Liver Function Tests: No results for input(s): AST, ALT, ALKPHOS, BILITOT, PROT, ALBUMIN in the last 72 hours. No results for input(s): LIPASE, AMYLASE in the last 72 hours. CBC: Recent Labs    03/21/22 0346 03/22/22 0441  WBC 9.3 9.3  HGB 8.7* 9.2*  HCT 26.0* 28.8*  MCV 87.8 89.7  PLT 177 239   Cardiac Enzymes: No results for input(s): CKTOTAL, CKMB, CKMBINDEX, TROPONINI in the last 72 hours. BNP: Invalid input(s): POCBNP D-Dimer: No results for input(s): DDIMER in the last 72 hours. Hemoglobin A1C: No results for input(s): HGBA1C in the last 72 hours. Fasting Lipid Panel: No results for input(s): CHOL, HDL, LDLCALC, TRIG, CHOLHDL, LDLDIRECT in the  last 72 hours. Thyroid Function Tests: No results for input(s): TSH, T4TOTAL, T3FREE, THYROIDAB in the last 72 hours.  Invalid input(s): FREET3 Anemia Panel: No results for input(s): VITAMINB12, FOLATE, FERRITIN, TIBC, IRON, RETICCTPCT in the last 72 hours.  No results found.   Echo EF 60 to 65%  TELEMETRY: Sinus rhythm 68 bpm:  ASSESSMENT AND PLAN:  Principal Problem:   Atherosclerosis of artery of extremity with rest pain [facility]) Active Problems:   Cardiac arrest ([place])   Respiratory arrest ([place])   Other specified hypothyroidism   Anxiety and depression   Normocytic anemia   PAD (peripheral artery disease) ([place])    1.  Cardiopulmonary arrest status post bilateral femoral endarterectomy 03/17/2022, to be bradycardic/vagal, with possible aspiration, requiring brief intubation, successfully extubated 2.  NSTEMI, (45,625, 15,443) perioperative, no chest pain, on heparin x48 hours, on aspirin and clopidogrel 3.  CAD, status post CABG x3, with known vein graft occlusions, followed by Dr. Clayborn Bigness 4.  History of left subclavian artery stent 5.  CKD, stage III, BUN and creatinine 16 and 1.42, GFR 37 6.  Anemia, hemoglobin hematocrit 7.9 and 24.3, respectively  Recommendations  1.  Agree with current therapy 2.  Continue dual antiplatelet therapy 3.  Continue Ranexa 4.  Carefully monitor renal status 5.  Consider  cardiac catheterization during this hospitalization, versus as outpatient when patient fully recovers.  Already at catheterization problematic, due to lack of vascular access, chronic kidney disease, and ongoing anemia.   Isaias Cowman, MD, PhD, [facility] 03/22/2022 8:52 AM

## 2022-03-22 NOTE — Care Management Important Message (Signed)
Important Message  Patient Details  Name: Sabrina [name] MRN: [id number] Date of Birth: 1941/08/30   Medicare Important Message Given:  Yes     Dannette Barbara 03/22/2022, 11:32 AM

## 2022-03-23 ENCOUNTER — Other Ambulatory Visit (INDEPENDENT_AMBULATORY_CARE_PROVIDER_SITE_OTHER): Payer: Self-pay | Admitting: Nurse Practitioner

## 2022-03-23 DIAGNOSIS — F419 Anxiety disorder, unspecified: Secondary | ICD-10-CM | POA: Diagnosis not present

## 2022-03-23 DIAGNOSIS — I469 Cardiac arrest, cause unspecified: Secondary | ICD-10-CM | POA: Diagnosis not present

## 2022-03-23 DIAGNOSIS — I70229 Atherosclerosis of native arteries of extremities with rest pain, unspecified extremity: Secondary | ICD-10-CM | POA: Diagnosis not present

## 2022-03-23 DIAGNOSIS — D649 Anemia, unspecified: Secondary | ICD-10-CM | POA: Diagnosis not present

## 2022-03-23 LAB — CBC
HCT: 25.6 % — ABNORMAL LOW (ref 36.0–46.0)
Hemoglobin: 8.3 g/dL — ABNORMAL LOW (ref 12.0–15.0)
MCH: 28.9 pg (ref 26.0–34.0)
MCHC: 32.4 g/dL (ref 30.0–36.0)
MCV: 89.2 fL (ref 80.0–100.0)
Platelets: 235 10*3/uL (ref 150–400)
RBC: 2.87 MIL/uL — ABNORMAL LOW (ref 3.87–5.11)
RDW: 13.6 % (ref 11.5–15.5)
WBC: 7.2 10*3/uL (ref 4.0–10.5)
nRBC: 0 % (ref 0.0–0.2)

## 2022-03-23 LAB — BASIC METABOLIC PANEL
Anion gap: 7 (ref 5–15)
BUN: 23 mg/dL (ref 8–23)
CO2: 26 mmol/L (ref 22–32)
Calcium: 8.9 mg/dL (ref 8.9–10.3)
Chloride: 105 mmol/L (ref 98–111)
Creatinine, Ser: 1.43 mg/dL — ABNORMAL HIGH (ref 0.44–1.00)
GFR, Estimated: 37 mL/min — ABNORMAL LOW (ref >60)
Glucose, Bld: 102 mg/dL — ABNORMAL HIGH (ref 70–99)
Potassium: 4.4 mmol/L (ref 3.5–5.1)
Sodium: 138 mmol/L (ref 135–145)

## 2022-03-23 MED ORDER — HYDROCHLOROTHIAZIDE 12.5 MG PO TABS
12.5000 mg | ORAL_TABLET | Freq: Every day | ORAL | Status: DC
  Administered 2022-03-24: 12.5 mg via ORAL
  Filled 2022-03-23: qty 1

## 2022-03-23 NOTE — Progress Notes (Signed)
Occupational Therapy Treatment Patient Details Name: Sabrina [name] MRN: [id number] DOB: January 19, 1941 Today's Date: 03/23/2022   History of present illness Pt is an 81 y/o F admitted on 03/17/22 after presenting to hospital for BLE endarterectomy for tx of severe PAD & claudication. Post procedure pt experienced acute cardiac arrest & placed on MV support, extubated on 5/24.  PMH: PAD, HTN, HLD, PVD, CAD s/p CABG, aortic stenosis, pulmonary HTN, GAD, chronic hip pain, GERD, hypothyroidism, CKD3   OT comments  Pt seen for OT treatment on this date. Upon arrival to room pt awake and alert seated in chair with family present. Pt reports 9/10 fatigue. Pt agreeable to tx and requested to brush their teeth. Pt required MIN A + HHA for ADL t/f ~40 ft and CGA + BUE support during grooming tasks. Pt reported feeling a warm sensation moving down her legs when standing and during mobility. Pt and family educated on therapeutic exercises to assist in ADL t/f. Pt and family verbalized understanding /return demonstrated of instruction provided. BLE edema noted, L worse than R, pt endorses increased pain with hip flexion. Pt making good progress toward goals.  Discharge and frequency updated to reflect pt progress.    Recommendations for follow up therapy are one component of a multi-disciplinary discharge planning process, led by the attending physician.  Recommendations may be updated based on patient status, additional functional criteria and insurance authorization.    Follow Up Recommendations  Acute inpatient rehab (3hours/day)    Assistance Recommended at Discharge Intermittent Supervision/Assistance  Patient can return home with the following  A little help with walking and/or transfers;Assistance with cooking/housework;A lot of help with bathing/dressing/bathroom   Equipment Recommendations  BSC/3in1    Recommendations for Other Services      Precautions / Restrictions Precautions Precautions:  Fall       Mobility Bed Mobility               General bed mobility comments: Not assessed, pt in chair at start of session    Transfers Overall transfer level: Needs assistance Equipment used: Rolling walker (2 wheels) Transfers: Sit to/from Stand Sit to Stand: Min guard                 Balance Overall balance assessment: Needs assistance Sitting-balance support: Feet supported, Bilateral upper extremity supported Sitting balance-Leahy Scale: Fair     Standing balance support: Bilateral upper extremity supported, During functional activity Standing balance-Leahy Scale: Fair                             ADL either performed or assessed with clinical judgement   ADL Overall ADL's : Needs assistance/impaired                                       General ADL Comments: MIN A + HHA for ADL t/f ~40 ft. CGA + BUE support during grooming tasks.      Cognition Arousal/Alertness: Awake/alert Behavior During Therapy: WFL for tasks assessed/performed Overall Cognitive Status: Within Functional Limits for tasks assessed                                          Exercises Exercises: General Upper Extremity, General Lower Extremity General Exercises -  Upper Extremity Shoulder Flexion: AROM, Strengthening, Both, 10 reps, Seated Elbow Flexion: AROM, Strengthening, Both, 10 reps, Seated Elbow Extension: AROM, Strengthening, Both, 10 reps, Seated Chair Push Up: AROM, Strengthening, Both, 10 reps, Seated General Exercises - Lower Extremity Gluteal Sets: AROM, Strengthening, Both, 10 reps, Seated Long Arc Quad: AROM, Strengthening, Both, 10 reps, Seated            Pertinent Vitals/ Pain       Pain Assessment Pain Assessment: Faces Faces Pain Scale: Hurts a little bit Pain Location: lower belly & BLE groin Pain Descriptors / Indicators: Discomfort, Grimacing Pain Intervention(s): Repositioned, Limited activity within  patient's tolerance   Frequency  Min 4X/week        Progress Toward Goals  OT Goals(current goals can now be found in the care plan section)  Progress towards OT goals: Progressing toward goals  Acute Rehab OT Goals Patient Stated Goal: To go home OT Goal Formulation: With patient/family Time For Goal Achievement: 04/02/22 Potential to Achieve Goals: Good ADL Goals Pt Will Perform Grooming: with set-up;with supervision;sitting Pt Will Perform Lower Body Dressing: with min assist;sit to/from stand Pt Will Transfer to Toilet: with min assist;ambulating;bedside commode  Plan Frequency needs to be updated;Discharge plan needs to be updated       AM-PAC OT "6 Clicks" Daily Activity     Outcome Measure   Help from another person eating meals?: None Help from another person taking care of personal grooming?: A Little Help from another person toileting, which includes using toliet, bedpan, or urinal?: A Little Help from another person bathing (including washing, rinsing, drying)?: A Lot Help from another person to put on and taking off regular upper body clothing?: A Little Help from another person to put on and taking off regular lower body clothing?: A Lot 6 Click Score: 17    End of Session Equipment Utilized During Treatment: Rolling walker (2 wheels)  OT Visit Diagnosis: Unsteadiness on feet (R26.81)   Activity Tolerance Patient tolerated treatment well   Patient Left in chair;with call bell/phone within reach;with family/visitor present   Nurse Communication          Time: 1020-1054 OT Time Calculation (min): 34 min  Charges: OT General Charges $OT Visit: 1 Visit OT Treatments $Self Care/Home Management : 8-22 mins $Therapeutic Exercise: 8-22 mins  [employer] OTDS  [employer] 03/23/2022, 11:30 AM

## 2022-03-23 NOTE — Progress Notes (Signed)
[facility] Patient ID: Myna BRIONA KORPELA MRN: [id number] DOB/AGE: Aug 05, 1941 81 y.o.   Admit date: 03/17/2022 Referring Physician Dr. Flora Lipps Primary Physician Dr. Deborra Medina Primary Cardiologist Dr. Lujean Amel Reason for Consultation cardiopulmonary arrest s/p resuscitation    HPI: Deissy Delavega is an 45yoF with a PMH of CAD s/p CABG x3 with LIMA-LAD, SVG-distal RCA and SVG-ramus intermedius 03/2000 with recent heart cath 11/18/2021 showing extensive native CAD and 100% occlusion SVG-RCA and 90%, distal SVG-OM with significant stenosis of her left subclavian artery, s/p left subclavian stenting on 11/23/2021, HFpEF (LVEF >55%, G1 DD mild-mild AS, mod-severe MS 10/2021), peripheral arterial disease, CKD 3, hypertension, hyperlipidemia, GERD, lumbar stenosis, chronic pain who presented to [facility] on [facility]/17/2022 for elective bilateral superior femoral artery and left iliac stenting with vascular surgery.  Cardiology is consulted for cardiopulmonary arrest post procedurally. Found to have an markedly elevated troponin at 15000, suggesting a perioperative MI.    Interval History:  - sitting in recliner, working with OT - has pleuritic pain under to her left breast that she can localize to one point without radiation, discomfort in right groin.  - pending discharge to SNF   Vitals:   03/23/22 0112 03/23/22 0550 03/23/22 0749 03/23/22 0832  BP: (!) 117/59 (!) 133/46 (!) 136/46 (!) 148/73  Pulse:  74 66   Resp: 18 16 17    Temp: 98.4 F (36.9 C) 98.2 F (36.8 C) 98 F (36.7 C)   TempSrc: Oral Oral    SpO2: 100% 99% 98%   Weight:      Height:         Intake/Output Summary (Last 24 hours) at 03/23/2022 1026 Last data filed at 03/23/2022 0500 Gross per 24 hour  Intake 180 ml  Output 200 ml  Net -20 ml       PHYSICAL EXAM  General: Pleasant elderly black female, well nourished, in no acute distress. Sitting in recliner in PCU. Daughter and son in law at bedside. HEENT:   Normocephalic and atraumatic. Neck:   No JVD.  Lungs: Normal respiratory effort on room air. Clear bilaterally to auscultation. No wheezes, crackles, rhonchi.  Heart: HRRR . Normal S1 and S2 present, 2/6 systolic murmur RUSB.   Abdomen: mildly distended appearing.  Msk: Normal strength and tone for age. Extremities: Warm and well perfused. No clubbing, cyanosis. No peripheral edema.  Neuro: Alert and oriented X 3. Psych:  Anxious mood. Answers questions appropriately.    LABS: Basic Metabolic Panel: Recent Labs    03/21/22 0346 03/23/22 0602  NA 136 138  K 4.0 4.4  CL 106 105  CO2 25 26  GLUCOSE 103* 102*  BUN 23 23  CREATININE 1.56* 1.43*  CALCIUM 8.5* 8.9    Liver Function Tests: No results for input(s): AST, ALT, ALKPHOS, BILITOT, PROT, ALBUMIN in the last 72 hours. No results for input(s): LIPASE, AMYLASE in the last 72 hours. CBC: Recent Labs    03/22/22 0441 03/23/22 0602  WBC 9.3 7.2  HGB 9.2* 8.3*  HCT 28.8* 25.6*  MCV 89.7 89.2  PLT 239 235    Cardiac Enzymes: No results for input(s): CKTOTAL, CKMB, CKMBINDEX, TROPONINI in the last 72 hours. BNP: Invalid input(s): POCBNP D-Dimer: No results for input(s): DDIMER in the last 72 hours. Hemoglobin A1C: No results for input(s): HGBA1C in the last 72 hours. Fasting Lipid Panel: No results for input(s): CHOL, HDL, LDLCALC, TRIG, CHOLHDL, LDLDIRECT in the last 72 hours. Thyroid Function Tests: No results for input(s): TSH,  T4TOTAL, T3FREE, THYROIDAB in the last 72 hours.  Invalid input(s): FREET3 Anemia Panel: No results for input(s): VITAMINB12, FOLATE, FERRITIN, TIBC, IRON, RETICCTPCT in the last 72 hours.  No results found.   Echo EF 60 to 65%  TELEMETRY: Sinus rhythm 68 bpm:  ASSESSMENT AND PLAN:  Principal Problem:   Atherosclerosis of artery of extremity with rest pain [facility]) Active Problems:   Cardiac arrest ([place])   Respiratory arrest ([place])   Other specified hypothyroidism   Anxiety and  depression   Normocytic anemia   PAD (peripheral artery disease) ([place])    1.  Cardiopulmonary arrest status post bilateral femoral endarterectomy 03/17/2022, to be bradycardic/vagal, with possible aspiration, requiring brief intubation, successfully extubated 2.  NSTEMI, (07,867, 15,443) perioperative, no chest pain, s/p heparin x48 hours, on aspirin and clopidogrel 3.  CAD, status post CABG x3, with known vein graft occlusions, followed by Dr. Clayborn Bigness 4.  History of left subclavian artery stent 10/2021 5.  CKD, stage III, BUN and creatinine 16 and 1.42, GFR 37 6.  Anemia, s/p 1 unit pRBCs on 5/27. H/H stable at 8.3/25.5 today  Recommendations  1.  Agree with current therapy 2.  Continue dual antiplatelet therapy 3.  Continue Ranexa 4.  Carefully monitor renal status 5.  Defer cardiac catheterization during this hospitalization and plan for outpatient when patient fully recovers.  Cardiac catheterization problematic, due to lack of vascular access, chronic kidney disease, and ongoing anemia. 6.  Will need follow up with Dr. Clayborn Bigness in 1-2 weeks  7.  Cardiac rehab inappropriate at this time due limited functional status bilateral LE, and need for SNF placement vs CIR    This patient's plan of care was discussed and created with Dr. Saralyn Pilar and he is in agreement.    Tristan Schroeder, PA-C 03/23/2022 10:26 AM

## 2022-03-23 NOTE — TOC Progression Note (Signed)
Transition of Care [facility]) - Progression Note    Patient Details  Name: Sabrina [name] MRN: [id number] Date of Birth: 1941/08/26  Transition of Care [facility]) CM/SW [place], LCSW Phone Number: 03/23/2022, 9:38 AM  Clinical Narrative:   PASARR obtained: [phone] [id number].  Expected Discharge Plan and Services                                                 Social Determinants of Health (SDOH) Interventions    Readmission Risk Interventions     View : No data to display.

## 2022-03-23 NOTE — Progress Notes (Addendum)
Inpatient Rehabilitation Admissions Coordinator   I spoke with patient and her daughter by phone . I discussed goals and expectations of a possible Cir  admit and level of care provided in hospital Rehab. After discussing with acute therapy this morning, patient prefers to stay local for her rehab rather than come to [facility] CIR/AIR rehab. Patient would benefit from CIR/level rehab.  I provided her daughter, my number in case they were to change their minds. I have alerted acute team and TOC.  Danne Baxter, RN, MSN Rehab Admissions Coordinator [phone] 03/23/2022 2:05 PM  Daughter called back after speaking with Dr Ouida Sills and she and her Mom now in agreement to Cir admit. I alerted acute team and TOC. I await medical clearance to admit this week.  Danne Baxter, RN, MSN Rehab Admissions Coordinator [phone] 03/23/2022 2:15 PM

## 2022-03-23 NOTE — Progress Notes (Addendum)
PROGRESS NOTE  Sabrina [name]    DOB: Apr 17, 1941, 81 y.o.  WFU:[id number]    Code Status: Full Code   DOA: 03/17/2022   LOS: 6   Brief hospital course  Sabrina [name] is a 81 y.o. female with a PMH significant for PAD, HTN, HLD, PVD, CAD s/p CABG, aortic stenosis, pulmonary HTN, GAD, chronic hip pain, GERD, hypothyroidism, CKD III.  They presented from home to the [facility] on 03/17/2022 for bilateral lower extremity endarterectomy in treatment of severe PAD and claudication.  5/24 b/l femoral endarterectomy and stenting 5/24 post procedure, experienced acute cardiac arrest, placed on MV support 5/24 extubated 5/25 identified ACS, started on heparin gtt 5/27 heparin drip stopped. 1 unit pRBCs transfused  03/23/22 -stable, ready for dc to CIR when bed available.   Assessment & Plan  Principal Problem:   Atherosclerosis of artery of extremity with rest pain [facility]) Active Problems:   Cardiac arrest (Sabrina [name])   Respiratory arrest (Sabrina [name])   Other specified hypothyroidism   Anxiety and depression   Normocytic anemia   PAD (peripheral artery disease) ([facility])  Severe ACUTE Hypoxic and Hypercapnic Respiratory Failure- resolved. s/p intubation. She is stable ORA without respiratory symptoms   CARDIAC FAILURE-NSTEMI/CARDIAC ARREST- stable. Troponins elevated to 15,682. She is endorsing improvement of pain of chest wall - cardiology consulted, appreciate recs  - likely cath outpatient - discontinued heparin gtt 5/27 and no reoccurrence of chest pain - PT/OT recommending SNF and patient is agreeable   ACUTE KIDNEY INJURY on CKDIII- appears to be close to baseline. Cr 1.49>1.42>1.5 - BMP am  Polypharmacy- patient requests simplification of medication regimen - discontinue hydralazine. Change to HCTZ+ valsartan combination. ON DISCHARGE. Non-formulary. - Declined changing her labetalol to metoprolol succinate  Constipation- had small BM after incomplete enema yesterday. Denies any abdominal  discomfort. This is chronic for patient. Asked for MOM as this typically helps her - MOM daily PRN - miralax BID discontinued per patient request - senna daily - SMOG PRN  PAD- s/p endarterectomy b/l LE. Wound vacs removed - vascular surgery consulted, appreciate recs  CAD s/p CABG  HLD  HTN- poorly controlled HTN on home medications.  - added on spironolactone 5/26 and Bps have been better controlled - continue ASA, plavix, zetia - continue home antihypertensives with above changes  Anemia- transfusion threshold 8 hgb 7.5>9.5>9.2>8.3 s/p 1 unit pRBCs on 5/27. No known acute bleed. - CBC am  Hypothyroidism-  - continue home levothyroxine  Anxiety/depression- chronic - continue home medications  Body mass index is 30.53 kg/m.  VTE ppx: TED hose Start: 03/17/22 1428   Diet:     Diet   Diet regular Room service appropriate? Yes; Fluid consistency: Thin   Consultants: Cardiology CCM Vascular surgery Subjective 03/23/22    Pt reports doing well. Complains of polypharmacy. Requests to have medications simplified and split evenly between morning and night  Objective   Vitals:   03/22/22 2256 03/23/22 0112 03/23/22 0550 03/23/22 0749  BP: (!) 140/51 (!) 117/59 (!) 133/46 (!) 136/46  Pulse: 75  74 66  Resp:  18 16 17   Temp:  98.4 F (36.9 C) 98.2 F (36.8 C) 98 F (36.7 C)  TempSrc:  Oral Oral   SpO2:  100% 99% 98%  Weight:      Height:        Intake/Output Summary (Last 24 hours) at 03/23/2022 0807 Last data filed at 03/23/2022 0500 Gross per 24 hour  Intake 420 ml  Output 200 ml  Net 220 ml    Filed Weights   03/17/22 0631 03/17/22 1428  Weight: 71.7 kg 73.3 kg     Physical Exam:  General: awake, alert, NAD HEENT: atraumatic, clear conjunctiva, anicteric sclera, MMM, hearing grossly normal Respiratory: normal respiratory effort. Cardiovascular: quick capillary refill, normal S1/S2, RRR, no JVD, murmurs Gastrointestinal: soft, NT, ND Nervous: A&O  x3. no gross focal neurologic deficits, normal speech Extremities: moves all equally, no edema, normal tone Skin: dry, intact, normal temperature, normal color. No rashes, lesions or ulcers on exposed skin. Psychiatry: calm mood, congruent affect  Labs   I have personally reviewed the following labs and imaging studies CBC    Component Value Date/Time   WBC 7.2 03/23/2022 0602   RBC 2.87 (L) 03/23/2022 0602   HGB 8.3 (L) 03/23/2022 0602   HGB 9.2 (L) 04/13/2014 0952   HCT 25.6 (L) 03/23/2022 0602   HCT 24.7 (L) 04/12/2014 1143   PLT 235 03/23/2022 0602   PLT 182 04/11/2014 0558   MCV 89.2 03/23/2022 0602   MCV 85 03/27/2014 0944   MCH 28.9 03/23/2022 0602   MCHC 32.4 03/23/2022 0602   RDW 13.6 03/23/2022 0602   RDW 13.7 03/27/2014 0944   LYMPHSABS 1.2 03/17/2022 1406   MONOABS 0.1 03/17/2022 1406   EOSABS 0.0 03/17/2022 1406   BASOSABS 0.0 03/17/2022 1406      Latest Ref Rng & Units 03/23/2022    6:02 AM 03/21/2022    3:46 AM 03/20/2022    4:25 AM  BMP  Glucose 70 - 99 mg/dL 102   103   106    BUN 8 - 23 mg/dL 23   23   19     Creatinine 0.44 - 1.00 mg/dL 1.43   1.56   1.50    Sodium 135 - 145 mmol/L 138   136   135    Potassium 3.5 - 5.1 mmol/L 4.4   4.0   3.7    Chloride 98 - 111 mmol/L 105   106   105    CO2 22 - 32 mmol/L 26   25   24     Calcium 8.9 - 10.3 mg/dL 8.9   8.5   8.4      No results found.  Disposition Plan & Communication  Patient status: Inpatient  Admitted From: Home Planned disposition location: CIR Anticipated discharge date: 5/31 pending CIR bed availability.  Family Communication: many family members at bedside Author: Richarda Osmond, DO [facility] 03/23/2022, 8:07 AM   Available by Epic secure chat 7AM-7PM. If 7PM-7AM, please contact night-coverage.  [facility] contact information found on [email or web address].

## 2022-03-23 NOTE — Progress Notes (Signed)
[facility]  Daily Progress Note   Subjective  -   Wound vacs removed yesterday.  Bilateral groin dressings have drainage with more extensive drainage on the right groin with a foul-smelling odor  Objective Vitals:   03/23/22 1501 03/23/22 1844 03/23/22 1945 03/23/22 2217  BP: (!) 133/57 (!) 163/53 (!) 137/48 (!) 139/49  Pulse: 69 73 71 76  Resp: 19 16 18 20   Temp: 98.4 F (36.9 C) 98.1 F (36.7 C) 98.2 F (36.8 C)   TempSrc: Oral Oral    SpO2: 100% 100% 97% 98%  Weight:      Height:        Intake/Output Summary (Last 24 hours) at 03/23/2022 2254 Last data filed at 03/23/2022 1350 Gross per 24 hour  Intake 780 ml  Output 400 ml  Net 380 ml    PULM  CTAB CV  RRR VASC  bilateral groins intact.  Right groin has some oozing  Laboratory CBC    Component Value Date/Time   WBC 7.2 03/23/2022 0602   HGB 8.3 (L) 03/23/2022 0602   HGB 9.2 (L) 04/13/2014 0952   HCT 25.6 (L) 03/23/2022 0602   HCT 24.7 (L) 04/12/2014 1143   PLT 235 03/23/2022 0602   PLT 182 04/11/2014 0558    BMET    Component Value Date/Time   NA 138 03/23/2022 0602   NA 139 09/13/2014 0000   NA 134 (L) 04/12/2014 0558   K 4.4 03/23/2022 0602   K 4.4 04/12/2014 0558   CL 105 03/23/2022 0602   CL 101 04/12/2014 0558   CO2 26 03/23/2022 0602   CO2 27 04/12/2014 0558   GLUCOSE 102 (H) 03/23/2022 0602   GLUCOSE 90 04/12/2014 0558   BUN 23 03/23/2022 0602   BUN 18 09/13/2014 0000   BUN 16 04/12/2014 0558   CREATININE 1.43 (H) 03/23/2022 0602   CREATININE 1.12 04/12/2014 0558   CALCIUM 8.9 03/23/2022 0602   CALCIUM 8.5 04/12/2014 0558   GFRNONAA 37 (L) 03/23/2022 0602   GFRNONAA 49 (L) 04/12/2014 0558   GFRAA 50 (L) 03/10/2020 1311   GFRAA 57 (L) 04/12/2014 0558    Assessment/Planning: POD #6 s/p bilateral femoral endarterectomy  Patient had notable drainage on bilateral groin dressings today.  The right groin site was oozing somewhat foul-smelling odor.  We will  culture the wound to evaluate for possible infection.   Kris Hartmann  03/23/2022, 10:54 PM

## 2022-03-23 NOTE — Progress Notes (Signed)
Physical Therapy Treatment Patient Details Name: Sabrina [name] MRN: [id number] DOB: Jan 31, 1941 Today's Date: 03/23/2022   History of Present Illness Pt is an 81 y/o F admitted on 03/17/22 after presenting to hospital for BLE endarterectomy for tx of severe PAD & claudication. Post procedure pt experienced acute cardiac arrest & placed on MV support, extubated on 5/24.  PMH: PAD, HTN, HLD, PVD, CAD s/p CABG, aortic stenosis, pulmonary HTN, GAD, chronic hip pain, GERD, hypothyroidism, CKD3    PT Comments    Pt did well with PT today and was able to ambulate much farther than she had been able to on any prior efforts with therapy.  She was pleasantly surprised with how well and far she was able to walk, though she did have increased fatigue and groin pain.  Pt showing good effort and overall making nice gains.     Recommendations for follow up therapy are one component of a multi-disciplinary discharge planning process, led by the attending physician.  Recommendations may be updated based on patient status, additional functional criteria and insurance authorization.  Follow Up Recommendations  Skilled nursing-short term rehab (<3 hours/day)     Assistance Recommended at Discharge Frequent or constant Supervision/Assistance  Patient can return home with the following A little help with walking and/or transfers;A little help with bathing/dressing/bathroom;Assistance with cooking/housework;Assist for transportation;Help with stairs or ramp for entrance   Equipment Recommendations  BSC/3in1    Recommendations for Other Services       Precautions / Restrictions Precautions Precautions: Fall     Mobility  Bed Mobility Overal bed mobility: Needs Assistance Bed Mobility: Supine to Sit     Supine to sit: Min assist, Min guard     General bed mobility comments: slow and labored effort with getting to EOB, but able to rise w/ only light use of PT's UEs to attain upright sitting     Transfers Overall transfer level: Needs assistance Equipment used: Rolling walker (2 wheels) Transfers: Sit to/from Stand Sit to Stand: Min assist           General transfer comment: Pt able to initiate upward movement but did need light assist to insure hips stayed foraward and she ultimately attained upright.    Ambulation/Gait Ambulation/Gait assistance: Min assist Gait Distance (Feet): 200 Feet Assistive device: Rolling walker (2 wheels)         General Gait Details: Slow and guarded but with increasing confidence and speed after "warming up"  she did need to take a few brief standing rest breaks and became fatigued (and slower with more prolonged effort) but ultimately did much more walking that she has in recent days.   Stairs             Wheelchair Mobility    Modified Rankin (Stroke Patients Only)       Balance Overall balance assessment: Needs assistance Sitting-balance support: Feet supported, Bilateral upper extremity supported Sitting balance-Leahy Scale: Good     Standing balance support: Bilateral upper extremity supported, During functional activity Standing balance-Leahy Scale: Fair                              Cognition Arousal/Alertness: Awake/alert Behavior During Therapy: WFL for tasks assessed/performed Overall Cognitive Status: Within Functional Limits for tasks assessed  Exercises General Exercises - Lower Extremity Ankle Circles/Pumps: AROM, Strengthening, Both, 10 reps, Supine Long Arc Quad: Strengthening, 10 reps Heel Slides: Strengthening, 10 reps Hip ABduction/ADduction: AROM, 10 reps Hip Flexion/Marching: AAROM, 10 reps (c/o some groin pain with increased reps)    General Comments        Pertinent Vitals/Pain Pain Assessment Pain Assessment: 0-10 Pain Score: 6  Pain Location: b/l groin and SI/glute areas    Home Living                           Prior Function            PT Goals (current goals can now be found in the care plan section) Progress towards PT goals: Progressing toward goals    Frequency    Min 2X/week      PT Plan Current plan remains appropriate    Co-evaluation              AM-PAC PT "6 Clicks" Mobility   Outcome Measure  Help needed turning from your back to your side while in a flat bed without using bedrails?: A Little Help needed moving from lying on your back to sitting on the side of a flat bed without using bedrails?: A Little Help needed moving to and from a bed to a chair (including a wheelchair)?: A Little Help needed standing up from a chair using your arms (e.g., wheelchair or bedside chair)?: A Little Help needed to walk in hospital room?: A Little Help needed climbing 3-5 steps with a railing? : A Lot 6 Click Score: 17    End of Session Equipment Utilized During Treatment: Gait belt Activity Tolerance: Patient tolerated treatment well;Patient limited by pain;Patient limited by fatigue Patient left: with call bell/phone within reach;with chair alarm set Nurse Communication: Mobility status PT Visit Diagnosis: Difficulty in walking, not elsewhere classified (R26.2);Unsteadiness on feet (R26.81);Muscle weakness (generalized) (M62.81);Pain Pain - Right/Left: Left Pain - part of body: Hip (b/l groin)     Time: 7989-2119 PT Time Calculation (min) (ACUTE ONLY): 27 min  Charges:  $Gait Training: 8-22 mins $Therapeutic Exercise: 8-22 mins                     Kreg Shropshire, DPT 03/23/2022, 5:03 PM

## 2022-03-23 NOTE — Progress Notes (Signed)
Inpatient Rehab Admissions Coordinator:   Per therapy recommendations (OT updating rec to CIR)  patient was screened for CIR candidacy by Clemens Catholic, MS, CCC-SLP. At this time, Pt. Appears to be a a potential candidate for CIR. I will place order for rehab consult per protocol for full assessment. Please contact me any with questions.  Clemens Catholic, [place], [place] Admissions Coordinator  [phone] ([place]) [phone] (office)

## 2022-03-23 NOTE — Progress Notes (Signed)
Patient getting transferred to [facility]. Reported call in to nurse Minette Brine, [place]. Patient and her daughter informed of the transfer. No concerns voiced at this time.

## 2022-03-24 ENCOUNTER — Other Ambulatory Visit: Payer: Self-pay

## 2022-03-24 ENCOUNTER — Inpatient Hospital Stay (HOSPITAL_COMMUNITY)
Admission: RE | Admit: 2022-03-24 | Discharge: 2022-03-29 | DRG: 945 | Disposition: A | Discharge status: Other Acute Inpatient Hospital | Payer: Medicare Other | Source: Other Acute Inpatient Hospital | Attending: Physical Medicine and Rehabilitation | Admitting: Physical Medicine and Rehabilitation

## 2022-03-24 ENCOUNTER — Encounter (HOSPITAL_COMMUNITY): Payer: Self-pay | Admitting: Physical Medicine and Rehabilitation

## 2022-03-24 DIAGNOSIS — Z741 Need for assistance with personal care: Secondary | ICD-10-CM | POA: Diagnosis present

## 2022-03-24 DIAGNOSIS — Z823 Family history of stroke: Secondary | ICD-10-CM

## 2022-03-24 DIAGNOSIS — R5381 Other malaise: Principal | ICD-10-CM | POA: Diagnosis present

## 2022-03-24 DIAGNOSIS — M25559 Pain in unspecified hip: Secondary | ICD-10-CM | POA: Diagnosis present

## 2022-03-24 DIAGNOSIS — I5032 Chronic diastolic (congestive) heart failure: Secondary | ICD-10-CM | POA: Diagnosis not present

## 2022-03-24 DIAGNOSIS — K5900 Constipation, unspecified: Secondary | ICD-10-CM | POA: Diagnosis not present

## 2022-03-24 DIAGNOSIS — I130 Hypertensive heart and chronic kidney disease with heart failure and stage 1 through stage 4 chronic kidney disease, or unspecified chronic kidney disease: Secondary | ICD-10-CM | POA: Diagnosis present

## 2022-03-24 DIAGNOSIS — S31104A Unspecified open wound of abdominal wall, left lower quadrant without penetration into peritoneal cavity, initial encounter: Secondary | ICD-10-CM | POA: Diagnosis not present

## 2022-03-24 DIAGNOSIS — S2232XD Fracture of one rib, left side, subsequent encounter for fracture with routine healing: Secondary | ICD-10-CM

## 2022-03-24 DIAGNOSIS — J9601 Acute respiratory failure with hypoxia: Secondary | ICD-10-CM

## 2022-03-24 DIAGNOSIS — Z880 Allergy status to penicillin: Secondary | ICD-10-CM

## 2022-03-24 DIAGNOSIS — N189 Chronic kidney disease, unspecified: Secondary | ICD-10-CM | POA: Diagnosis not present

## 2022-03-24 DIAGNOSIS — N179 Acute kidney failure, unspecified: Secondary | ICD-10-CM | POA: Diagnosis not present

## 2022-03-24 DIAGNOSIS — Z79899 Other long term (current) drug therapy: Secondary | ICD-10-CM

## 2022-03-24 DIAGNOSIS — E785 Hyperlipidemia, unspecified: Secondary | ICD-10-CM | POA: Diagnosis not present

## 2022-03-24 DIAGNOSIS — D631 Anemia in chronic kidney disease: Secondary | ICD-10-CM | POA: Diagnosis not present

## 2022-03-24 DIAGNOSIS — I110 Hypertensive heart disease with heart failure: Secondary | ICD-10-CM | POA: Diagnosis not present

## 2022-03-24 DIAGNOSIS — I739 Peripheral vascular disease, unspecified: Secondary | ICD-10-CM | POA: Diagnosis not present

## 2022-03-24 DIAGNOSIS — R197 Diarrhea, unspecified: Secondary | ICD-10-CM | POA: Diagnosis not present

## 2022-03-24 DIAGNOSIS — Z888 Allergy status to other drugs, medicaments and biological substances status: Secondary | ICD-10-CM

## 2022-03-24 DIAGNOSIS — N183 Chronic kidney disease, stage 3 unspecified: Secondary | ICD-10-CM | POA: Diagnosis not present

## 2022-03-24 DIAGNOSIS — R531 Weakness: Secondary | ICD-10-CM | POA: Diagnosis not present

## 2022-03-24 DIAGNOSIS — K219 Gastro-esophageal reflux disease without esophagitis: Secondary | ICD-10-CM | POA: Diagnosis present

## 2022-03-24 DIAGNOSIS — Z951 Presence of aortocoronary bypass graft: Secondary | ICD-10-CM

## 2022-03-24 DIAGNOSIS — I70229 Atherosclerosis of native arteries of extremities with rest pain, unspecified extremity: Secondary | ICD-10-CM | POA: Diagnosis not present

## 2022-03-24 DIAGNOSIS — Z6830 Body mass index (BMI) 30.0-30.9, adult: Secondary | ICD-10-CM | POA: Diagnosis not present

## 2022-03-24 DIAGNOSIS — D638 Anemia in other chronic diseases classified elsewhere: Secondary | ICD-10-CM | POA: Diagnosis not present

## 2022-03-24 DIAGNOSIS — I10 Essential (primary) hypertension: Secondary | ICD-10-CM | POA: Diagnosis not present

## 2022-03-24 DIAGNOSIS — I252 Old myocardial infarction: Secondary | ICD-10-CM

## 2022-03-24 DIAGNOSIS — Z95820 Peripheral vascular angioplasty status with implants and grafts: Secondary | ICD-10-CM

## 2022-03-24 DIAGNOSIS — E669 Obesity, unspecified: Secondary | ICD-10-CM | POA: Diagnosis not present

## 2022-03-24 DIAGNOSIS — E6609 Other obesity due to excess calories: Secondary | ICD-10-CM

## 2022-03-24 DIAGNOSIS — Z803 Family history of malignant neoplasm of breast: Secondary | ICD-10-CM

## 2022-03-24 DIAGNOSIS — J9602 Acute respiratory failure with hypercapnia: Secondary | ICD-10-CM | POA: Diagnosis not present

## 2022-03-24 DIAGNOSIS — Z9862 Peripheral vascular angioplasty status: Secondary | ICD-10-CM

## 2022-03-24 DIAGNOSIS — Z8616 Personal history of COVID-19: Secondary | ICD-10-CM

## 2022-03-24 DIAGNOSIS — Z833 Family history of diabetes mellitus: Secondary | ICD-10-CM

## 2022-03-24 DIAGNOSIS — D62 Acute posthemorrhagic anemia: Secondary | ICD-10-CM | POA: Diagnosis not present

## 2022-03-24 DIAGNOSIS — N289 Disorder of kidney and ureter, unspecified: Secondary | ICD-10-CM | POA: Diagnosis not present

## 2022-03-24 DIAGNOSIS — Z7982 Long term (current) use of aspirin: Secondary | ICD-10-CM

## 2022-03-24 DIAGNOSIS — I701 Atherosclerosis of renal artery: Secondary | ICD-10-CM | POA: Diagnosis present

## 2022-03-24 DIAGNOSIS — R112 Nausea with vomiting, unspecified: Secondary | ICD-10-CM | POA: Diagnosis not present

## 2022-03-24 DIAGNOSIS — I70223 Atherosclerosis of native arteries of extremities with rest pain, bilateral legs: Secondary | ICD-10-CM | POA: Diagnosis not present

## 2022-03-24 DIAGNOSIS — K5901 Slow transit constipation: Secondary | ICD-10-CM | POA: Diagnosis not present

## 2022-03-24 DIAGNOSIS — M96A2 Fracture of one rib associated with chest compression and cardiopulmonary resuscitation: Secondary | ICD-10-CM | POA: Diagnosis not present

## 2022-03-24 DIAGNOSIS — I25810 Atherosclerosis of coronary artery bypass graft(s) without angina pectoris: Secondary | ICD-10-CM | POA: Diagnosis present

## 2022-03-24 DIAGNOSIS — I469 Cardiac arrest, cause unspecified: Secondary | ICD-10-CM | POA: Diagnosis not present

## 2022-03-24 DIAGNOSIS — I214 Non-ST elevation (NSTEMI) myocardial infarction: Secondary | ICD-10-CM

## 2022-03-24 DIAGNOSIS — S31109A Unspecified open wound of abdominal wall, unspecified quadrant without penetration into peritoneal cavity, initial encounter: Secondary | ICD-10-CM

## 2022-03-24 DIAGNOSIS — I6522 Occlusion and stenosis of left carotid artery: Secondary | ICD-10-CM | POA: Diagnosis not present

## 2022-03-24 DIAGNOSIS — T8140XA Infection following a procedure, unspecified, initial encounter: Secondary | ICD-10-CM | POA: Diagnosis not present

## 2022-03-24 DIAGNOSIS — D72829 Elevated white blood cell count, unspecified: Secondary | ICD-10-CM | POA: Diagnosis not present

## 2022-03-24 DIAGNOSIS — I2510 Atherosclerotic heart disease of native coronary artery without angina pectoris: Secondary | ICD-10-CM | POA: Diagnosis present

## 2022-03-24 DIAGNOSIS — Z89421 Acquired absence of other right toe(s): Secondary | ICD-10-CM

## 2022-03-24 DIAGNOSIS — Z7902 Long term (current) use of antithrombotics/antiplatelets: Secondary | ICD-10-CM

## 2022-03-24 DIAGNOSIS — Z87891 Personal history of nicotine dependence: Secondary | ICD-10-CM

## 2022-03-24 DIAGNOSIS — T8149XA Infection following a procedure, other surgical site, initial encounter: Secondary | ICD-10-CM | POA: Diagnosis not present

## 2022-03-24 DIAGNOSIS — M47812 Spondylosis without myelopathy or radiculopathy, cervical region: Secondary | ICD-10-CM | POA: Diagnosis not present

## 2022-03-24 DIAGNOSIS — I2582 Chronic total occlusion of coronary artery: Secondary | ICD-10-CM | POA: Diagnosis not present

## 2022-03-24 DIAGNOSIS — Z8249 Family history of ischemic heart disease and other diseases of the circulatory system: Secondary | ICD-10-CM

## 2022-03-24 DIAGNOSIS — N184 Chronic kidney disease, stage 4 (severe): Secondary | ICD-10-CM | POA: Diagnosis not present

## 2022-03-24 DIAGNOSIS — Z885 Allergy status to narcotic agent status: Secondary | ICD-10-CM

## 2022-03-24 DIAGNOSIS — G894 Chronic pain syndrome: Secondary | ICD-10-CM | POA: Diagnosis present

## 2022-03-24 DIAGNOSIS — Z820 Family history of epilepsy and other diseases of the nervous system: Secondary | ICD-10-CM

## 2022-03-24 DIAGNOSIS — E782 Mixed hyperlipidemia: Secondary | ICD-10-CM | POA: Diagnosis not present

## 2022-03-24 DIAGNOSIS — F411 Generalized anxiety disorder: Secondary | ICD-10-CM | POA: Diagnosis not present

## 2022-03-24 DIAGNOSIS — Z886 Allergy status to analgesic agent status: Secondary | ICD-10-CM

## 2022-03-24 DIAGNOSIS — Z8674 Personal history of sudden cardiac arrest: Secondary | ICD-10-CM

## 2022-03-24 DIAGNOSIS — Z96643 Presence of artificial hip joint, bilateral: Secondary | ICD-10-CM | POA: Diagnosis present

## 2022-03-24 LAB — CREATININE, SERUM
Creatinine, Ser: 1.59 mg/dL — ABNORMAL HIGH (ref 0.44–1.00)
GFR, Estimated: 33 mL/min — ABNORMAL LOW (ref >60)

## 2022-03-24 MED ORDER — RANOLAZINE ER 500 MG PO TB12
500.0000 mg | ORAL_TABLET | Freq: Two times a day (BID) | ORAL | Status: DC
  Administered 2022-03-24 – 2022-03-29 (×10): 500 mg via ORAL
  Filled 2022-03-24 (×11): qty 1

## 2022-03-24 MED ORDER — OXYCODONE-ACETAMINOPHEN 5-325 MG PO TABS
1.0000 | ORAL_TABLET | ORAL | Status: DC | PRN
  Administered 2022-03-24: 2 via ORAL
  Administered 2022-03-25: 1 via ORAL
  Administered 2022-03-26 (×2): 2 via ORAL
  Administered 2022-03-27: 1 via ORAL
  Administered 2022-03-27 – 2022-03-28 (×3): 2 via ORAL
  Administered 2022-03-29: 1 via ORAL
  Filled 2022-03-24: qty 2
  Filled 2022-03-24: qty 1
  Filled 2022-03-24 (×2): qty 2
  Filled 2022-03-24: qty 1
  Filled 2022-03-24 (×3): qty 2
  Filled 2022-03-24: qty 1

## 2022-03-24 MED ORDER — SPIRONOLACTONE 25 MG PO TABS
25.0000 mg | ORAL_TABLET | Freq: Every day | ORAL | Status: DC
  Administered 2022-03-25 – 2022-03-29 (×5): 25 mg via ORAL
  Filled 2022-03-24 (×5): qty 1

## 2022-03-24 MED ORDER — NORTRIPTYLINE HCL 10 MG PO CAPS
10.0000 mg | ORAL_CAPSULE | Freq: Two times a day (BID) | ORAL | Status: DC
  Administered 2022-03-24 – 2022-03-29 (×10): 10 mg via ORAL
  Filled 2022-03-24 (×11): qty 1

## 2022-03-24 MED ORDER — ACETAMINOPHEN 650 MG RE SUPP: 325.0000 mg | RECTAL | Status: DC | PRN

## 2022-03-24 MED ORDER — HYDROCHLOROTHIAZIDE 12.5 MG PO TABS
12.5000 mg | ORAL_TABLET | Freq: Every day | ORAL | Status: DC
  Filled 2022-03-24: qty 1

## 2022-03-24 MED ORDER — EZETIMIBE 10 MG PO TABS
10.0000 mg | ORAL_TABLET | Freq: Every day | ORAL | Status: DC
  Administered 2022-03-25 – 2022-03-29 (×5): 10 mg via ORAL
  Filled 2022-03-24 (×5): qty 1

## 2022-03-24 MED ORDER — IRBESARTAN 300 MG PO TABS
300.0000 mg | ORAL_TABLET | Freq: Every day | ORAL | Status: DC
  Administered 2022-03-25 – 2022-03-29 (×5): 300 mg via ORAL
  Filled 2022-03-24 (×5): qty 1

## 2022-03-24 MED ORDER — LOSARTAN POTASSIUM-HCTZ 50-12.5 MG PO TABS: 1.0000 | ORAL_TABLET | Freq: Every day | ORAL | 0 refills | Status: DC

## 2022-03-24 MED ORDER — LABETALOL HCL 300 MG PO TABS
300.0000 mg | ORAL_TABLET | Freq: Two times a day (BID) | ORAL | Status: DC
  Administered 2022-03-24 – 2022-03-29 (×10): 300 mg via ORAL
  Filled 2022-03-24 (×11): qty 1

## 2022-03-24 MED ORDER — ACETAMINOPHEN 325 MG PO TABS
325.0000 mg | ORAL_TABLET | ORAL | Status: DC | PRN
  Administered 2022-03-28: 650 mg via ORAL
  Filled 2022-03-24: qty 2

## 2022-03-24 MED ORDER — FERROUS SULFATE 325 (65 FE) MG PO TABS
325.0000 mg | ORAL_TABLET | Freq: Every day | ORAL | Status: DC
  Administered 2022-03-25 – 2022-03-29 (×5): 325 mg via ORAL
  Filled 2022-03-24 (×5): qty 1

## 2022-03-24 MED ORDER — MAGNESIUM HYDROXIDE 400 MG/5ML PO SUSP: 15.0000 mL | Freq: Every day | ORAL | Status: DC | PRN

## 2022-03-24 MED ORDER — ASPIRIN 81 MG PO CHEW
81.0000 mg | CHEWABLE_TABLET | Freq: Every day | ORAL | Status: DC
  Administered 2022-03-25 – 2022-03-29 (×5): 81 mg via ORAL
  Filled 2022-03-24 (×5): qty 1

## 2022-03-24 MED ORDER — SPIRONOLACTONE 25 MG PO TABS: 25.0000 mg | ORAL_TABLET | Freq: Every day | ORAL | Status: DC

## 2022-03-24 MED ORDER — ALUM & MAG HYDROXIDE-SIMETH 200-200-20 MG/5ML PO SUSP: 15.0000 mL | ORAL | Status: DC | PRN

## 2022-03-24 MED ORDER — CLOPIDOGREL BISULFATE 75 MG PO TABS
75.0000 mg | ORAL_TABLET | Freq: Every day | ORAL | Status: DC
  Administered 2022-03-25 – 2022-03-29 (×5): 75 mg via ORAL
  Filled 2022-03-24 (×5): qty 1

## 2022-03-24 NOTE — Progress Notes (Signed)
Inpatient Rehabilitation Admission Medication Review by a Pharmacist  A complete drug regimen review was completed for this patient to identify any potential clinically significant medication issues.  High Risk Drug Classes Is patient taking? Indication by Medication  Antipsychotic No   Anticoagulant No   Antibiotic No   Opioid Yes Percocet prn pain  Antiplatelet Yes Aspirin, Clopidogrel s/p endarterectomy   Hypoglycemics/insulin No   Vasoactive Medication Yes HCTZ, labetalol, Avapro, spironolactone, Ranexa for CP, BP  Chemotherapy No   Other Yes Nortriptyline for HAs     Type of Medication Issue Identified Description of Issue Recommendation(s)  Drug Interaction(s) (clinically significant)     Duplicate Therapy     Allergy     No Medication Administration End Date     Incorrect Dose     Additional Drug Therapy Needed     Significant med changes from prior encounter (inform family/care partners about these prior to discharge).    Other       Clinically significant medication issues were identified that warrant physician communication and completion of prescribed/recommended actions by midnight of the next day:  No  Pharmacist comments: None  Time spent performing this drug regimen review (minutes):  20 minutes   Tad Moore 03/24/2022 2:07 PM

## 2022-03-24 NOTE — TOC Transition Note (Signed)
Transition of Care [facility]) - CM/SW Discharge Note   Patient Details  Name: Sabrina [name] MRN: [id number] Date of Birth: [birth year]  [facility] CM/SW Contact:  Donnelly Angelica, LCSW Phone Number: 03/24/2022, 1:55 PM   Clinical Narrative:    Pt has orders to discharge to [facility]. RN will call report to [phone] (Room [phone]). CIR admissions coordinator has arranged transport through [facility]. Transport paperwork in discharge packet. No further concerns. CSW signing off.    Final next level of care: IP Rehab Facility Barriers to Discharge: Barriers Resolved   Patient Goals and CMS Choice        Discharge Placement                Patient to be transferred to facility by: [facility]   Patient and family notified of of transfer: 03/24/22  Discharge Plan and Services                                     Social Determinants of Health (SDOH) Interventions     Readmission Risk Interventions     View : No data to display.

## 2022-03-24 NOTE — H&P (Incomplete)
Physical Medicine and Rehabilitation Admission H&P     HPI: Sabrina [name] is an 81 year old right-handed female with history of obesity with BMI 30.53, chronic hip pain, chronic anemia, CAD/CABG x3 with LIMA-LAD, SVG-distal RCA and SVG-ramus intermedius 03/2000 with recent heart cath 11/18/2021 showing extensive native CAD and 100% occlusion SVG-RCA and 90%, distal SVG-OM with significant stenosis of her left subclavian status post left subclavian stenting 11/23/2021 maintained on low-dose aspirin, diastolic congestive heart failure, carotid artery angioplasty 2004, CKD stage III, hypertension, hyperlipidemia, peripheral vascular disease with multiple interventions in the past.  Per chart review patient lives alone.  1 level home.  Independent ADLs and driving.  Family in area can provide assistance as needed.  Presented to [facility] 03/17/2022 for elective bilateral superior femoral artery and left iliac stenting with vascular surgery due to peripheral vascular disease and debilitating claudication with some mild rest pain.  Underwent left common femoral, profunda femoris, and superficial femoral artery endarterectomies as well as right common femoral profunda femoris and superficial femoral artery enterectomy's stent placement to left external iliac artery with 8 mm diameter by 4 cm length of life star stent 03/17/2022 per Dr. Leotis Pain.    Postoperative acute respiratory failure cardiac arrest requiring CPR x2 minutes.  To have markedly elevated troponin of 15,000.  She did require intubation for airway protection and extubated same day.  Echocardiogram with ejection fraction of 60 to 65% no wall motion abnormalities.  She is currently maintained on aspirin 81 mg daily and Plavix 75 mg daily for at least 12 months and plan to discuss outpatient cardiac catheterization.  Ranexa ongoing at 500 mg twice daily per cardiology services Dr. Lujean Amel.  Ongoing wound care bilateral groin dressings with wound  vacs removed 03/22/2022.  She is tolerating a regular diet.  Acute on chronic anemia 8.3 and monitored she was transfused 1 unit packed red blood cells 5/27.  ..  Therapy evaluations completed due to patient decreased functional mobility was admitted for a comprehensive rehab program.  Review of Systems  Constitutional:  Negative for chills and fever.  HENT:  Negative for hearing loss.   Eyes:  Negative for blurred vision and double vision.  Respiratory:  Negative for cough and shortness of breath.   Cardiovascular:  Positive for leg swelling. Negative for chest pain and palpitations.  Gastrointestinal:  Positive for constipation. Negative for heartburn, nausea and vomiting.       GERD  Genitourinary:  Negative for dysuria, flank pain and hematuria.  Musculoskeletal:  Positive for joint pain and myalgias.  Skin:  Negative for rash.  Psychiatric/Behavioral:         Anxiety  All other systems reviewed and are negative. 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 b.) TTE 11/16/2021: EF >55%; mild AS (MPG 9 mmHg)   CAD (coronary artery disease)    a.) s/p 3v CABG 03/29/2000   Carotid artery stenosis    a.) s/p LEFT CEA 09/09/2003. b.) Carotid doppler 40/98/1191: 4-78% LICA, CTO RICA; subclavian stenosis   Cataracts, bilateral    Cervical spondylosis  without myelopathy    Chronic diastolic CHF (congestive heart failure), NYHA class 3 (HCC)    a.) TTE 05/27/2016: EF >55%, mild LA enlargement, triv PR, mild MR, mod TR; G3DD. b.) TTE 12/12/2017: EF >55%, mild LVH, BAE, mild MR/PR, mod TR; RVSP  52.8 mmHg. c.) TTE 03/18/2020: EF >55%, BAD, AS (MPG 8.7 mmHg); triv MR, mild TR/PR. d.) TTE 11/16/2021: EF >55%, LVH, G1DD, triv MR, mild PR, mod TR; AS (MPG 9 mmHg); MS (MPG 5 mmHg)   Chronic kidney disease, stage III (moderate) (HCC)    Chronic narcotic use 06/24/2014   Chronic pain syndrome    GERD (gastroesophageal reflux disease)    History of 2019 novel coronavirus disease (COVID-19) 09/22/2021   Hyperlipidemia    Hypertension    Long term current use of antithrombotics/antiplatelets    a.) on daily DAPT therapy (ASA + clopidogrel)   Lumbar stenosis with neurogenic claudication    Mitral stenosis 11/16/2021   a.) TTE 11/16/2021: EF >55%; mod MS (MPG 5 mmHg)   Osteoarthritis of hip    Pulmonary hypertension ([place]) 12/12/2017   a.) TTE 12/12/2017: mild; RVSP 52.8 mmHg   PVD (peripheral vascular disease) (HCC)    Renal artery stenosis (HCC)    S/P CABG x 3 03/29/2000   a.) 3v CABG: LIMA-LAD, SVG-dRCA, SVG-RI   Secondary hyperparathyroidism (HCC)    SOB (shortness of breath)    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   Past Surgical History:  Procedure Laterality Date   ABDOMINAL HYSTERECTOMY  1976   CAROTID ARTERY ANGIOPLASTY Left    CAROTID ENDARTERECTOMY Left 09/09/2003   Procedure: CAROTID ENDARTERECTOMY; Location: [facility]; Surgeon: Maura Crandall, MD   COLONOSCOPY WITH PROPOFOL N/A 08/16/2017   Procedure: COLONOSCOPY WITH PROPOFOL;  Surgeon: [name], MD;  Location: [facility];  Service: Endoscopy;  Laterality: N/A;   CORONARY ANGIOPLASTY WITH STENT PLACEMENT  2000   CORONARY ARTERY BYPASS GRAFT N/A 03/29/2000   Procedure: 3v CORONARY ARTERY BYPASS GRAFT; Location: [facility]   CYSTOSCOPY WITH STENT PLACEMENT Bilateral    ENDARTERECTOMY FEMORAL Bilateral 03/17/2022   Procedure: ENDARTERECTOMY FEMORAL ( BILATERAL SFA STENT);  Surgeon: Katha Cabal, MD;  Location: [facility];  Service:  Vascular;  Laterality: Bilateral;   ESOPHAGOGASTRODUODENOSCOPY (EGD) WITH PROPOFOL N/A 08/16/2017   Procedure: ESOPHAGOGASTRODUODENOSCOPY (EGD) WITH PROPOFOL;  Surgeon: [name], MD;  Location: [facility];  Service: Endoscopy;  Laterality: N/A;   ESOPHAGOGASTRODUODENOSCOPY (EGD) WITH PROPOFOL N/A 06/29/2018   Procedure: ESOPHAGOGASTRODUODENOSCOPY (EGD) WITH PROPOFOL;  Surgeon: Virgel Manifold, MD;  Location: [facility];  Service: Endoscopy;  Laterality: N/A;   INSERTION OF ILIAC STENT Left 03/17/2022   Procedure: INSERTION OF ILIAC STENT;  Surgeon: Katha Cabal, MD;  Location: [facility];  Service: Vascular;  Laterality: Left;   LAPAROSCOPIC CHOLECYSTECTOMY Left 10/26/1999   Procedure: LAPAROSCOPIC CHOLECYSTECTOMY; Location: [facility]; Surgeon: Rochel Brome, MD   LEFT HEART CATH AND CORONARY ANGIOGRAPHY N/A 11/18/2021   Procedure: LEFT HEART CATH AND CORONARY ANGIOGRAPHY;  Surgeon: Yolonda Kida, MD;  Location: [place] [facility];  Service: Cardiovascular;  Laterality: N/A;   LEFT HEART CATH AND CORS/GRAFTS ANGIOGRAPHY Left 11/19/2002   Procedure: LEFT HEART CATH AND CORS/GRAFTS ANGIOGRAPHY; Location: [place]; Surgeon: Katrine Coho, MD   LEFT HEART CATH AND CORS/GRAFTS ANGIOGRAPHY Left 09/17/2003   Procedure: LEFT HEART CATH AND CORS/GRAFTS ANGIOGRAPHY; Location: [facility]; Surgeon: Katrine Coho, MD   LOWER EXTREMITY ANGIOGRAPHY Right 01/06/2022  Procedure: Lower Extremity Angiography;  Surgeon: Katha Cabal, MD;  Location: [place] [facility];  Service: Cardiovascular;  Laterality: Right;   RENAL ARTERY ANGIOPLASTY Bilateral 12/2013   TOE AMPUTATION Right    small toe   TONSILLECTOMY AND ADENOIDECTOMY     TOTAL HIP ARTHROPLASTY Left 2005   TOTAL HIP ARTHROPLASTY Right 2015   UPPER EXTREMITY ANGIOGRAPHY Left 11/23/2021   Procedure: UPPER EXTREMITY ANGIOGRAPHY;  Surgeon: Algernon Huxley, MD;  Location: [place] [facility];  Service: Cardiovascular;  Laterality: Left;    Family History  Problem Relation Age of Onset   Stroke Mother    Hypertension Mother    Diabetes Mother    Hypertension Father    Heart disease Sister        MI   Multiple sclerosis Daughter    Multiple sclerosis Son    Cerebral aneurysm Son    Seizures Son    Cerebral aneurysm Son    Breast cancer Paternal Aunt 80   Social History:  reports that she quit smoking about 23 years ago. Her smoking use included cigarettes. She has never used smokeless tobacco. She reports that she does not drink alcohol and does not use drugs. Allergies:  Allergies  Allergen Reactions   Citalopram Anaphylaxis    Throat closing    Dilaudid [Hydromorphone Hcl] Nausea And Vomiting   Hydrochlorothiazide Other (See Comments)    Decreased GFR (Nov 2015)   Liothyronine     Hair fell out, caused headaches    Nsaids     CKD stage III - avoid nephrotoxic drugs   Nubain [Nalbuphine Hcl]     Burning sensation in back   Penicillins Itching   Prasugrel Itching   Statins Itching   Medications Prior to Admission  Medication Sig Dispense Refill   Ascorbic Acid (VITAMIN C) 500 MG CAPS Take 500 mg by mouth daily. 30 capsule 3   aspirin 81 MG tablet Take 81 mg by mouth daily.     butalbital-acetaminophen-caffeine (FIORICET) 50-325-40 MG tablet Take 1 tablet by mouth every 4 (four) hours as needed for headache. 120 tablet 1   docusate sodium (COLACE) 100 MG capsule Take 1 capsule (100 mg total) by mouth daily as needed for mild constipation or moderate constipation. 30 capsule 3   ezetimibe (ZETIA) 10 MG tablet Take 1 tablet (10 mg total) by mouth daily. 90 tablet 1   ferrous sulfate 325 (65 FE) MG EC tablet Take 1 tablet (325 mg total) by mouth 2 (two) times daily with a meal. 60 tablet 3   hydrALAZINE (APRESOLINE) 100 MG tablet Take 1 tablet (100 mg total) by mouth 3 (three) times daily. [place] KitchenNOTE DOSE INCREASE TO 100 MG. KEEP ON FILE FOR FUTURE REFILLS 270 tablet 1   labetalol (NORMODYNE) 300 MG tablet Take 1  tablet (300 mg total) by mouth 2 (two) times daily. For hypertension 180 tablet 1   nortriptyline (PAMELOR) 10 MG capsule Take 1 capsule (10 mg total) by mouth 2 (two) times daily. For chronic headaches 180 capsule 1   Omega 3 1000 MG CAPS Take 1,000 mg by mouth 2 (two) times daily.     pantoprazole (PROTONIX) 40 MG tablet Take 1 tablet (40 mg total) by mouth daily. 30 tablet 5   ranolazine (RANEXA) 500 MG 12 hr tablet Take 1 tablet (500 mg total) by mouth 2 (two) times daily. 60 tablet 3   traMADol (ULTRAM) 50 MG tablet Take 1 tablet (50 mg total) by mouth every  6 (six) hours as needed. 120 tablet 5   valsartan (DIOVAN) 320 MG tablet Take 1 tablet (320 mg total) by mouth daily. 90 tablet 0   clopidogrel (PLAVIX) 75 MG tablet Take 1 tablet (75 mg total) by mouth daily. To prevent strokes 90 tablet 1      Home: Home Living Family/patient expects to be discharged to:: Private residence Living Arrangements: Alone Available Help at Discharge: Family, Available 24 hours/day (family visiting from [place] & can provide assistance upon d/c, they're hoping for pt to avoid STR) Type of Home: House Home Access: Level entry Home Layout: One level Home Equipment: Conservation officer, nature (2 wheels), [employer] - single point   Functional History: Prior Function Prior Level of Function : Independent/Modified Independent, Driving Mobility Comments: independent without AD, denies falls, driving ADLs Comments: independent with cooking, cleaning, bathing, dressing  Functional Status:  Mobility: Bed Mobility Overal bed mobility: Needs Assistance Bed Mobility: Supine to Sit Supine to sit: Min assist, Min guard General bed mobility comments: slow and labored effort with getting to EOB, but able to rise w/ only light use of PT's UEs to attain upright sitting Transfers Overall transfer level: Needs assistance Equipment used: Rolling walker (2 wheels) Transfers: Sit to/from Stand Sit to Stand: Min assist Bed to/from  chair/wheelchair/BSC transfer type:: Step pivot Step pivot transfers: Mod assist, +2 physical assistance, +2 safety/equipment General transfer comment: Pt able to initiate upward movement but did need light assist to insure hips stayed foraward and she ultimately attained upright. Ambulation/Gait Ambulation/Gait assistance: Min assist Gait Distance (Feet): 200 Feet Assistive device: Rolling walker (2 wheels) Gait Pattern/deviations: Step-through pattern, Decreased step length - right, Decreased step length - left General Gait Details: Slow and guarded but with increasing confidence and speed after "warming up"  she did need to take a few brief standing rest breaks and became fatigued (and slower with more prolonged effort) but ultimately did much more walking that she has in recent days. Gait velocity: decreased    ADL: ADL Overall ADL's : Needs assistance/impaired General ADL Comments: MIN A + HHA for ADL t/f ~40 ft. CGA + BUE support during grooming tasks.  Cognition: Cognition Overall Cognitive Status: Within Functional Limits for tasks assessed Orientation Level: Oriented X4 Cognition Arousal/Alertness: Awake/alert Behavior During Therapy: WFL for tasks assessed/performed Overall Cognitive Status: Within Functional Limits for tasks assessed General Comments: sweet lady  Physical Exam: Blood pressure (!) 185/78, pulse 79, temperature 98.1 F (36.7 C), resp. rate 16, height 5\' 1"  (1.549 m), weight 73.3 kg, SpO2 99 %. Physical Exam Skin:    Comments: Bilateral femoral endarterectomy sites with some oozing of right groin.  Neurological:     Comments: Patient is alert.  Makes eye contact with examiner.  Follows simple commands.  Provides name and age but could not recall full hospital course.    Results for orders placed or performed during the hospital encounter of 03/17/22 (from the past 48 hour(s))  CBC     Status: Abnormal   Collection Time: 03/23/22  6:02 AM  Result Value  Ref Range   WBC 7.2 4.0 - 10.5 K/uL   RBC 2.87 (L) 3.87 - 5.11 MIL/uL   Hemoglobin 8.3 (L) 12.0 - 15.0 g/dL   HCT 25.6 (L) 36.0 - 46.0 %   MCV 89.2 80.0 - 100.0 fL   MCH 28.9 26.0 - 34.0 pg   MCHC 32.4 30.0 - 36.0 g/dL   RDW 13.6 11.5 - 15.5 %   Platelets 235 150 -  400 K/uL   nRBC 0.0 0.0 - 0.2 %    Comment: Performed at [facility], [address] [place], [place] [address]  Basic metabolic panel     Status: Abnormal   Collection Time: 03/23/22  6:02 AM  Result Value Ref Range   Sodium 138 135 - 145 mmol/L   Potassium 4.4 3.5 - 5.1 mmol/L   Chloride 105 98 - 111 mmol/L   CO2 26 22 - 32 mmol/L   Glucose, Bld 102 (H) 70 - 99 mg/dL    Comment: Glucose reference range applies only to samples taken after fasting for at least 8 hours.   BUN 23 8 - 23 mg/dL   Creatinine, Ser 1.43 (H) 0.44 - 1.00 mg/dL   Calcium 8.9 8.9 - 10.3 mg/dL   GFR, Estimated 37 (L) >60 mL/min    Comment: (NOTE) Calculated using the CKD-EPI Creatinine Equation (2021)    Anion gap 7 5 - 15    Comment: Performed at [facility], [address] [place], [name] [address]  Aerobic/Anaerobic Culture w Gram Stain (surgical/deep wound)     Status: None (Preliminary result)   Collection Time: 03/23/22  2:28 PM   Specimen: Wound  Result Value Ref Range   Specimen Description      WOUND Performed at [facility], [address], [place], [place] [address]    Special Requests      [facility] RIGHT Performed at [facility], [place], [place] [address]    Gram Stain      FEW WBC PRESENT,BOTH PMN AND MONONUCLEAR MODERATE GRAM NEGATIVE RODS Performed at [facility], [place] [address] [place], [place] [address]    Culture PENDING    Report Status PENDING    No results found.    Blood pressure (!) 185/78, pulse 79, temperature 98.1 F (36.7 C), resp. rate 16, height 5\' 1"  (1.549 m), weight 73.3 kg, SpO2 99 %.  Medical Problem List and Plan: 1.  Functional deficits secondary to debility secondary to peripheral vascular disease with debilitating claudication.  Status post bilateral endarterectomies with stent placement 03/17/2022 per Dr. Leotis Pain  -patient may *** shower  -ELOS/Goals: *** 2.  Antithrombotics: -DVT/anticoagulation:  Mechanical: Antiembolism stockings, thigh (TED hose) Bilateral lower extremities  -antiplatelet therapy: Aspirin 81 mg daily and Plavix 75 mg daily 3. Pain Management:/Chronic hip pain oxycodone as needed 4. Mood: Provided emotional support  -antipsychotic agents: N/A 5. Neuropsych: This patient is capable of making decisions on her own behalf. 6. Skin/Wound Care: Routine skin checks 7. Fluids/Electrolytes/Nutrition: Routine in and outs with follow-up chemistries 8.  Postoperative cardiopulmonary arrest/non-STEMI with history of CAD/CABG.  Patient did receive CPR x2 minutes.  Follow-up cardiology services.  Continue Ranexa 500 mg twice daily.  Plan outpatient cardiac catheterization 9.  Acute on chronic anemia.  Continue iron supplement.  Follow-up CBC 10.  CKD stage III.  Baseline creatinine 1.42-1.56.  Follow-up chemistries 11.  Diastolic congestive heart failure.  Monitor for any signs of fluid overload 12.  Hypertension.  Aldactone 25 mg daily, Avapro 300 mg daily, labetalol 300 mg twice daily, HCTZ 12.5 mg daily.  Monitor with increased mobility 13.  Hyperlipidemia.  Zetia 14.  Obesity.  BMI 30.53.  Dietary follow-up Cathlyn Parsons, PA-C 03/24/2022

## 2022-03-24 NOTE — Discharge Instructions (Signed)
Inpatient Rehab Discharge Instructions  Zayah A [place] Discharge date and time: No discharge date for patient encounter.   Activities/Precautions/ Functional Status: Activity: activity as tolerated Diet: regular diet Wound Care: Routine skin checks Functional status:  ___ No restrictions     ___ Walk up steps independently ___ 24/7 supervision/assistance   ___ Walk up steps with assistance ___ Intermittent supervision/assistance  ___ Bathe/dress independently ___ Walk with walker     __x_ Bathe/dress with assistance ___ Walk Independently    ___ Shower independently ___ Walk with assistance    ___ Shower with assistance ___ No alcohol     ___ Return to work/school ________  Special Instructions: No driving smoking or alcohol   My questions have been answered and I understand these instructions. I will adhere to these goals and the provided educational materials after my discharge from the hospital.  Patient/Caregiver Signature _______________________________ Date __________  Clinician Signature _______________________________________ Date __________  Please bring this form and your medication list with you to all your follow-up doctor's appointments.

## 2022-03-24 NOTE — Progress Notes (Signed)
  Inpatient Rehabilitation Admissions Coordinator   I spoke with pt's son and daughter by phone as well as patient and all in agreement to [facility] admit today. She is admitting to [facility] [address] with Dr Ranell Patrick is admitting Rehab MD.I have contacted [facility] to arrange transport, Marcello Moores. [facility] nurse can call report to [facility] rehab at [phone]. Care team and TOC made aware.  Danne Baxter, RN, MSN Rehab Admissions Coordinator [phone] 03/24/2022 12:50 PM

## 2022-03-24 NOTE — Progress Notes (Signed)
[name] Cardiology Patient ID: Sabrina [name] MRN: [id number] DOB/AGE: [id number] y.o.   Admit date: 03/17/2022 Referring Physician Dr. Flora Lipps Primary Physician Dr. Deborra Medina Primary Cardiologist Dr. Lujean Amel Reason for Consultation cardiopulmonary arrest s/p resuscitation    HPI: Sabrina [name] is an 29yoF with a PMH of CAD s/p CABG x3 with LIMA-LAD, SVG-distal RCA and SVG-ramus intermedius 03/2000 with recent heart cath 11/18/2021 showing extensive native CAD and 100% occlusion SVG-RCA and 90%, distal SVG-OM with significant stenosis of her left subclavian artery, s/p left subclavian stenting on 11/23/2021, HFpEF (LVEF >55%, G1 DD mild-mild AS, mod-severe MS 10/2021), peripheral arterial disease, CKD 3, hypertension, hyperlipidemia, GERD, lumbar stenosis, chronic pain who presented to [facility] on 03/17/2022 for elective bilateral superior femoral artery and left iliac stenting with vascular surgery.  Cardiology is consulted for cardiopulmonary arrest post procedurally. Found to have an markedly elevated troponin at 15000, suggesting a perioperative MI.    Interval History:  -feels good today, walked around the nurses station and did well yesterday  -remains with pleuritic discomfort localized to a point under the left breast  -eager to go to rehab   Vitals:   03/23/22 1945 03/23/22 2217 03/24/22 0509 03/24/22 0727  BP: (!) 137/48 (!) 139/49 (!) 185/78 (!) 137/51  Pulse: 71 76 79 72  Resp: 18 20 16 18   Temp: 98.2 F (36.8 C)  98.1 F (36.7 C) 99 F (37.2 C)  TempSrc:    Oral  SpO2: 97% 98% 99% 96%  Weight:      Height:         Intake/Output Summary (Last 24 hours) at 03/24/2022 1032 Last data filed at 03/24/2022 1008 Gross per 24 hour  Intake 540 ml  Output 250 ml  Net 290 ml       PHYSICAL EXAM  General: Pleasant elderly black female, well nourished, in no acute distress. Sitting in recliner in PCU.  HEENT:  Normocephalic and atraumatic. Neck:   No JVD.   Lungs: Normal respiratory effort on room air. Clear bilaterally to auscultation. No wheezes, crackles, rhonchi.  Heart: HRRR . Normal S1 and S2 present, 2/6 systolic murmur RUSB.   Abdomen: mildly distended appearing.  Msk: Normal strength and tone for age. Extremities: Warm and well perfused. No clubbing, cyanosis. No peripheral edema.  Neuro: Alert and oriented X 3. Psych:  Anxious mood. Answers questions appropriately.    LABS: Basic Metabolic Panel: Recent Labs    03/23/22 0602 03/24/22 0503  NA 138  --   K 4.4  --   CL 105  --   CO2 26  --   GLUCOSE 102*  --   BUN 23  --   CREATININE 1.43* 1.59*  CALCIUM 8.9  --     Liver Function Tests: No results for input(s): AST, ALT, ALKPHOS, BILITOT, PROT, ALBUMIN in the last 72 hours. No results for input(s): LIPASE, AMYLASE in the last 72 hours. CBC: Recent Labs    03/22/22 0441 03/23/22 0602  WBC 9.3 7.2  HGB 9.2* 8.3*  HCT 28.8* 25.6*  MCV 89.7 89.2  PLT 239 235    Cardiac Enzymes: No results for input(s): CKTOTAL, CKMB, CKMBINDEX, TROPONINI in the last 72 hours. BNP: Invalid input(s): POCBNP D-Dimer: No results for input(s): DDIMER in the last 72 hours. Hemoglobin A1C: No results for input(s): HGBA1C in the last 72 hours. Fasting Lipid Panel: No results for input(s): CHOL, HDL, LDLCALC, TRIG, CHOLHDL, LDLDIRECT in the last 72 hours. Thyroid Function Tests: No  results for input(s): TSH, T4TOTAL, T3FREE, THYROIDAB in the last 72 hours.  Invalid input(s): FREET3 Anemia Panel: No results for input(s): VITAMINB12, FOLATE, FERRITIN, TIBC, IRON, RETICCTPCT in the last 72 hours.  No results found.   Echo EF 60 to 65%  TELEMETRY: Sinus rhythm 68 bpm:  ASSESSMENT AND PLAN:  Principal Problem:   Atherosclerosis of artery of extremity with rest pain [facility]) Active Problems:   Cardiac arrest ([place])   Respiratory arrest ([place])   Other specified hypothyroidism   Anxiety and depression   Normocytic anemia   PAD  (peripheral artery disease) ([place])    1.  Cardiopulmonary arrest status post bilateral femoral endarterectomy 03/17/2022, to be bradycardic/vagal, with possible aspiration, requiring brief intubation, successfully extubated 2.  NSTEMI, (41,287, 15,443) perioperative, no chest pain, s/p heparin x48 hours, on aspirin and clopidogrel 3.  CAD, status post CABG x3, with known vein graft occlusions, followed by Dr. Clayborn Bigness 4.  History of left subclavian artery stent 10/2021 5.  CKD, stage III, BUN and creatinine 16 and 1.42, GFR 37 6.  Anemia, s/p 1 unit pRBCs on 5/27. H/H stable at 8.3/25.5 today  Recommendations  1.  Agree with current therapy 2.  Continue dual antiplatelet therapy 3.  Continue Ranexa 4.  Carefully monitor renal status 5.  Defer cardiac catheterization during this hospitalization and plan for outpatient when patient fully recovers.  Cardiac catheterization problematic, due to lack of vascular access, chronic kidney disease, and ongoing anemia. 6.  Will need follow up with Dr. Clayborn Bigness in 1-2 weeks , ok for discharge from a cardiac standpoint 7.  Cardiac rehab inappropriate at this time due limited functional status bilateral LE, and need for SNF placement vs CIR    This patient's plan of care was discussed and created with Dr. Saralyn Pilar and he is in agreement.    Tristan Schroeder, PA-C 03/24/2022 10:32 AM

## 2022-03-24 NOTE — H&P (Signed)
Physical Medicine and Rehabilitation Admission H&P  CC: Debility  HPI: Sabrina [name] is an 81 year old right-handed female with history of obesity with BMI 30.53, chronic hip pain, chronic anemia, CAD/CABG x3 with LIMA-LAD, SVG-distal RCA and SVG-ramus intermedius 03/2000 with recent heart cath 11/18/2021 showing extensive native CAD and 100% occlusion SVG-RCA and 90%, distal SVG-OM with significant stenosis of her left subclavian status post left subclavian stenting 11/23/2021 maintained on low-dose aspirin, diastolic congestive heart failure, carotid artery angioplasty 2004, CKD stage III, hypertension, hyperlipidemia, peripheral vascular disease with multiple interventions in the past.  Per chart review patient lives alone.  1 level home.  Independent ADLs and driving.  Family in area can provide assistance as needed.  Presented to [facility] 03/17/2022 for elective bilateral superior femoral artery and left iliac stenting with vascular surgery due to peripheral vascular disease and debilitating claudication with some mild rest pain.  Underwent left common femoral, profunda femoris, and superficial femoral artery endarterectomies as well as right common femoral profunda femoris and superficial femoral artery enterectomy's stent placement to left external iliac artery with 8 mm diameter by 4 cm length of life star stent 03/17/2022 per Dr. Leotis Pain.    Postoperative acute respiratory failure cardiac arrest requiring CPR x2 minutes.  To have markedly elevated troponin of 15,000.  She did require intubation for airway protection and extubated same day.  Echocardiogram with ejection fraction of 60 to 65% no wall motion abnormalities.  She is currently maintained on aspirin 81 mg daily and Plavix 75 mg daily for at least 12 months and plan to discuss outpatient cardiac catheterization.  Ranexa ongoing at 500 mg twice daily per cardiology services Dr. Lujean Amel.  Ongoing wound care bilateral groin dressings  with wound vacs removed 03/22/2022.  She is tolerating a regular diet.  Acute on chronic anemia 8.3 and monitored she was transfused 1 unit packed red blood cells 5/27.  ..  Therapy evaluations completed due to patient decreased functional mobility was admitted for a comprehensive rehab program. Complains of lower extremity weakness.   Review of Systems  Constitutional:  Negative for chills and fever.  HENT:  Negative for hearing loss.   Eyes:  Negative for blurred vision and double vision.  Respiratory:  Negative for cough and shortness of breath.   Cardiovascular:  Positive for leg swelling. Negative for chest pain and palpitations.  Gastrointestinal:  Positive for constipation. Negative for heartburn, nausea and vomiting.       GERD  Genitourinary:  Negative for dysuria, flank pain and hematuria.  Musculoskeletal:  Positive for joint pain and myalgias.  Skin:  Negative for rash.  Psychiatric/Behavioral:         Anxiety  All other systems reviewed and are negative. 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 b.) TTE 11/16/2021: EF >55%; mild AS (MPG 9 mmHg)   CAD (coronary artery disease)    a.) s/p 3v CABG 03/29/2000   Carotid artery stenosis    a.) s/p LEFT CEA 09/09/2003. b.) Carotid doppler 18/84/1660: 6-30% LICA, CTO RICA; subclavian stenosis   Cataracts,  bilateral    Cervical spondylosis without myelopathy    Chronic diastolic CHF (congestive heart failure), NYHA class 3 (HCC)    a.) TTE 05/27/2016: EF >55%, mild LA enlargement, triv PR, mild MR, mod TR; G3DD. b.) TTE 12/12/2017:  EF >55%, mild LVH, BAE, mild MR/PR, mod TR; RVSP 52.8 mmHg. c.) TTE 03/18/2020: EF >55%, BAD, AS (MPG 8.7 mmHg); triv MR, mild TR/PR. d.) TTE 11/16/2021: EF >55%, LVH, G1DD, triv MR, mild PR, mod TR; AS (MPG 9 mmHg); MS (MPG 5 mmHg)   Chronic kidney disease, stage III (moderate) (HCC)    Chronic narcotic use 06/24/2014   Chronic pain syndrome    GERD (gastroesophageal reflux disease)    History of 2019 novel coronavirus disease (COVID-19) 09/22/2021   Hyperlipidemia    Hypertension    Long term current use of antithrombotics/antiplatelets    a.) on daily DAPT therapy (ASA + clopidogrel)   Lumbar stenosis with neurogenic claudication    Mitral stenosis 11/16/2021   a.) TTE 11/16/2021: EF >55%; mod MS (MPG 5 mmHg)   Osteoarthritis of hip    Pulmonary hypertension ([place]) 12/12/2017   a.) TTE 12/12/2017: mild; RVSP 52.8 mmHg   PVD (peripheral vascular disease) (HCC)    Renal artery stenosis (HCC)    S/P CABG x 3 03/29/2000   a.) 3v CABG: LIMA-LAD, SVG-dRCA, SVG-RI   Secondary hyperparathyroidism (HCC)    SOB (shortness of breath)    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   Past Surgical History:  Procedure Laterality Date   ABDOMINAL HYSTERECTOMY  1976   CAROTID ARTERY ANGIOPLASTY Left    CAROTID ENDARTERECTOMY Left 09/09/2003   Procedure: CAROTID ENDARTERECTOMY; Location: [facility]; Surgeon: Maura Crandall, MD   COLONOSCOPY WITH PROPOFOL N/A 08/16/2017   Procedure: COLONOSCOPY WITH PROPOFOL;  Surgeon: [name], MD;  Location: [facility] ENDOSCOPY;  Service: Endoscopy;  Laterality: N/A;   CORONARY ANGIOPLASTY WITH STENT PLACEMENT  2000   CORONARY ARTERY BYPASS GRAFT N/A 03/29/2000   Procedure: 3v CORONARY ARTERY BYPASS GRAFT; Location: [facility]   CYSTOSCOPY WITH STENT PLACEMENT Bilateral    ENDARTERECTOMY FEMORAL Bilateral 03/17/2022   Procedure: ENDARTERECTOMY FEMORAL ( BILATERAL SFA STENT);  Surgeon: Katha Cabal, MD;  Location: [facility] ORS;  Service: Vascular;  Laterality: Bilateral;   ESOPHAGOGASTRODUODENOSCOPY (EGD) WITH PROPOFOL N/A 08/16/2017   Procedure: ESOPHAGOGASTRODUODENOSCOPY (EGD) WITH PROPOFOL;  Surgeon: [name], MD;  Location: [facility] ENDOSCOPY;  Service: Endoscopy;  Laterality: N/A;   ESOPHAGOGASTRODUODENOSCOPY (EGD) WITH PROPOFOL N/A 06/29/2018   Procedure: ESOPHAGOGASTRODUODENOSCOPY (EGD) WITH PROPOFOL;  Surgeon: Virgel Manifold, MD;  Location: [facility] ENDOSCOPY;  Service: Endoscopy;  Laterality: N/A;   INSERTION OF ILIAC STENT Left 03/17/2022   Procedure: INSERTION OF ILIAC STENT;  Surgeon: Katha Cabal, MD;  Location: [facility] OR[facility];  Service: Vascular;  Laterality: Left;   LAPAROSCOPIC CHOLECYSTECTOMY Left 10/26/1999   Procedure: LAPAROSCOPIC CHOLECYSTECTOMY; Location: [facility]; Surgeon: Rochel Brome, MD   LEFT HEART CATH AND CORONARY ANGIOGRAPHY N/A 11/18/2021   Procedure: LEFT HEART CATH AND CORONARY ANGIOGRAPHY;  Surgeon: Yolonda Kida, MD;  Location: [place] [facility];  Service: Cardiovascular;  Laterality: N/A;   LEFT HEART CATH AND CORS/GRAFTS ANGIOGRAPHY Left 11/19/2002   Procedure: LEFT HEART CATH AND CORS/GRAFTS ANGIOGRAPHY; Location: [place]; Surgeon: Katrine Coho, MD   LEFT HEART CATH AND CORS/GRAFTS ANGIOGRAPHY Left 09/17/2003   Procedure: LEFT HEART CATH AND CORS/GRAFTS ANGIOGRAPHY; Location: [facility]; Surgeon: Katrine Coho, MD  LOWER EXTREMITY ANGIOGRAPHY Right 01/06/2022   Procedure: Lower Extremity Angiography;  Surgeon: Katha Cabal, MD;  Location: [place] [facility];  Service: Cardiovascular;  Laterality: Right;   RENAL ARTERY ANGIOPLASTY Bilateral 12/2013   TOE AMPUTATION Right    small toe   TONSILLECTOMY AND ADENOIDECTOMY     TOTAL HIP ARTHROPLASTY Left 2005   TOTAL HIP ARTHROPLASTY Right 2015   UPPER EXTREMITY ANGIOGRAPHY Left 11/23/2021   Procedure: UPPER EXTREMITY ANGIOGRAPHY;  Surgeon: Algernon Huxley, MD;  Location: [place] [facility];   Service: Cardiovascular;  Laterality: Left;   Family History  Problem Relation Age of Onset   Stroke Mother    Hypertension Mother    Diabetes Mother    Hypertension Father    Heart disease Sister        MI   Multiple sclerosis Daughter    Multiple sclerosis Son    Cerebral aneurysm Son    Seizures Son    Cerebral aneurysm Son    Breast cancer Paternal Aunt 80   Social History:  reports that she quit smoking about 23 years ago. Her smoking use included cigarettes. She has never used smokeless tobacco. She reports that she does not drink alcohol and does not use drugs. Allergies:  Allergies  Allergen Reactions   Citalopram Anaphylaxis    Throat closing    Dilaudid [Hydromorphone Hcl] Nausea And Vomiting   Hydrochlorothiazide Other (See Comments)    Decreased GFR (Nov 2015)   Liothyronine     Hair fell out, caused headaches    Nsaids     CKD stage III - avoid nephrotoxic drugs   Nubain [Nalbuphine Hcl]     Burning sensation in back   Penicillins Itching   Prasugrel Itching   Statins Itching   Medications Prior to Admission  Medication Sig Dispense Refill   Ascorbic Acid (VITAMIN C) 500 MG CAPS Take 500 mg by mouth daily. 30 capsule 3   aspirin 81 MG tablet Take 81 mg by mouth daily.     butalbital-acetaminophen-caffeine (FIORICET) 50-325-40 MG tablet Take 1 tablet by mouth 2 (two) times daily as needed for headache.     clopidogrel (PLAVIX) 75 MG tablet Take 1 tablet (75 mg total) by mouth daily. To prevent strokes 90 tablet 1   ezetimibe (ZETIA) 10 MG tablet Take 1 tablet (10 mg total) by mouth daily. 90 tablet 1   ferrous sulfate 325 (65 FE) MG EC tablet Take 1 tablet (325 mg total) by mouth 2 (two) times daily with a meal. 60 tablet 3   labetalol (NORMODYNE) 300 MG tablet Take 1 tablet (300 mg total) by mouth 2 (two) times daily. For hypertension 180 tablet 1   nortriptyline (PAMELOR) 10 MG capsule Take 1 capsule (10 mg total) by mouth 2 (two) times daily. For chronic  headaches 180 capsule 1   Omega 3 1000 MG CAPS Take 1,000 mg by mouth 2 (two) times daily.     pantoprazole (PROTONIX) 40 MG tablet Take 1 tablet (40 mg total) by mouth daily. 30 tablet 5   ranolazine (RANEXA) 500 MG 12 hr tablet Take 1 tablet (500 mg total) by mouth 2 (two) times daily. 60 tablet 3   traMADol (ULTRAM) 50 MG tablet Take 1 tablet (50 mg total) by mouth every 6 (six) hours as needed. (Patient taking differently: Take 50 mg by mouth every 6 (six) hours as needed for severe pain.) 120 tablet 5   valsartan (DIOVAN) 320 MG tablet Take 320  mg by mouth daily.     docusate sodium (COLACE) 100 MG capsule Take 1 capsule (100 mg total) by mouth daily as needed for mild constipation or moderate constipation. (Patient not taking: Reported on 03/24/2022) 30 capsule 3   losartan-hydrochlorothiazide (HYZAAR) 50-12.5 MG tablet Take 1 tablet by mouth daily. (Patient not taking: Reported on 03/24/2022) 30 tablet 0   [START ON 03/25/2022] spironolactone (ALDACTONE) 25 MG tablet Take 1 tablet (25 mg total) by mouth daily. (Patient not taking: Reported on 03/24/2022)    Home: Home Living Family/patient expects to be discharged to:: Private residence Living Arrangements: Alone Available Help at Discharge: Family, Available 24 hours/day (family visiting from [place] & can provide assistance upon d/c, they're hoping for pt to avoid STR) Type of Home: House Home Access: Level entry Home Layout: One level Home Equipment: Conservation officer, nature (2 wheels), [employer] - single point   Functional History: Prior Function Prior Level of Function : Independent/Modified Independent, Driving Mobility Comments: independent without AD, denies falls, driving ADLs Comments: independent with cooking, cleaning, bathing, dressing   Functional Status:  Mobility: Bed Mobility Overal bed mobility: Needs Assistance Bed Mobility: Supine to Sit Supine to sit: Min assist, Min guard General bed mobility comments: slow and labored effort  with getting to EOB, but able to rise w/ only light use of PT's UEs to attain upright sitting Transfers Overall transfer level: Needs assistance Equipment used: Rolling walker (2 wheels) Transfers: Sit to/from Stand Sit to Stand: Min assist Bed to/from chair/wheelchair/BSC transfer type:: Step pivot Step pivot transfers: Mod assist, +2 physical assistance, +2 safety/equipment General transfer comment: Pt able to initiate upward movement but did need light assist to insure hips stayed foraward and she ultimately attained upright. Ambulation/Gait Ambulation/Gait assistance: Min assist Gait Distance (Feet): 200 Feet Assistive device: Rolling walker (2 wheels) Gait Pattern/deviations: Step-through pattern, Decreased step length - right, Decreased step length - left General Gait Details: Slow and guarded but with increasing confidence and speed after "warming up"  she did need to take a few brief standing rest breaks and became fatigued (and slower with more prolonged effort) but ultimately did much more walking that she has in recent days. Gait velocity: decreased   ADL: ADL Overall ADL's : Needs assistance/impaired General ADL Comments: MIN A + HHA for ADL t/f ~40 ft. CGA + BUE support during grooming tasks.   Cognition: Cognition Overall Cognitive Status: Within Functional Limits for tasks assessed Orientation Level: Oriented X4 Cognition Arousal/Alertness: Awake/alert Behavior During Therapy: WFL for tasks assessed/performed Overall Cognitive Status: Within Functional Limits for tasks assessed General Comments: sweet lady    Physical Exam: Blood pressure 136/64, pulse 78, temperature 98.5 F (36.9 C), temperature source Oral, resp. rate 18, SpO2 100 %. Gen: no distress, normal appearing HEENT: oral mucosa pink and moist, NCAT Cardio: Reg rate Chest: normal effort, normal rate of breathing Abd: soft, non-distended Ext: no edema Psych: pleasant, normal affect Skin:     Comments: Bilateral femoral endarterectomy sites with some oozing of right groin.  Neurological:     Comments: Patient is alert.  Makes eye contact with examiner.  Follows simple commands.  Provides name and age but could not recall full hospital course. Significant lower extremity weakness R>L. 3/5 strength left lower extremity, 2/5 right lower extremity  Results for orders placed or performed during the hospital encounter of 03/17/22 (from the past 48 hour(s))  CBC     Status: Abnormal   Collection Time: 03/23/22  6:02 AM  Result Value  Ref Range   WBC 7.2 4.0 - 10.5 K/uL   RBC 2.87 (L) 3.87 - 5.11 MIL/uL   Hemoglobin 8.3 (L) 12.0 - 15.0 g/dL   HCT 25.6 (L) 36.0 - 46.0 %   MCV 89.2 80.0 - 100.0 fL   MCH 28.9 26.0 - 34.0 pg   MCHC 32.4 30.0 - 36.0 g/dL   RDW 13.6 11.5 - 15.5 %   Platelets 235 150 - 400 K/uL   nRBC 0.0 0.0 - 0.2 %    Comment: Performed at [facility], [address] [place], [place] [address]  Basic metabolic panel     Status: Abnormal   Collection Time: 03/23/22  6:02 AM  Result Value Ref Range   Sodium 138 135 - 145 mmol/L   Potassium 4.4 3.5 - 5.1 mmol/L   Chloride 105 98 - 111 mmol/L   CO2 26 22 - 32 mmol/L   Glucose, Bld 102 (H) 70 - 99 mg/dL    Comment: Glucose reference range applies only to samples taken after fasting for at least 8 hours.   BUN 23 8 - 23 mg/dL   Creatinine, Ser 1.43 (H) 0.44 - 1.00 mg/dL   Calcium 8.9 8.9 - 10.3 mg/dL   GFR, Estimated 37 (L) >60 mL/min    Comment: (NOTE) Calculated using the CKD-EPI Creatinine Equation (2021)    Anion gap 7 5 - 15    Comment: Performed at [facility], [address] [place], [place] [address]  Aerobic/Anaerobic Culture w Gram Stain (surgical/deep wound)     Status: None (Preliminary result)   Collection Time: 03/23/22  2:28 PM   Specimen: Wound  Result Value Ref Range   Specimen Description      WOUND Performed at [facility], [address], [place], [place]  [address]    Special Requests      GROIN RIGHT Performed at [facility], [address] [place], [place] [address]    Gram Stain      FEW WBC PRESENT,BOTH PMN AND MONONUCLEAR MODERATE GRAM NEGATIVE RODS    Culture      MODERATE PROTEUS MIRABILIS SUSCEPTIBILITIES TO FOLLOW Performed at [facility], [place] [address] [place], [place] [address]    Report Status PENDING   Creatinine, serum     Status: Abnormal   Collection Time: 03/24/22  5:03 AM  Result Value Ref Range   Creatinine, Ser 1.59 (H) 0.44 - 1.00 mg/dL   GFR, Estimated 33 (L) >60 mL/min    Comment: (NOTE) Calculated using the CKD-EPI Creatinine Equation (2021) Performed at [facility], [address] [place], [place] [address]    No results found.    Blood pressure 136/64, pulse 78, temperature 98.5 F (36.9 C), temperature source Oral, resp. rate 18, SpO2 100 %.  Medical Problem List and Plan: 1. Functional deficits secondary to debility secondary to peripheral vascular disease with debilitating claudication.  Status post bilateral endarterectomies with stent placement 03/17/2022 per Dr. Leotis Pain  -patient may shower  -ELOS/Goals: 5-7 days S  -Admit to CIR 2.  Antithrombotics: -DVT/anticoagulation:  Mechanical: Antiembolism stockings, thigh (TED hose) Bilateral lower extremities  -antiplatelet therapy: Aspirin 81 mg daily and Plavix 75 mg daily 3. Pain Management:/Chronic hip pain : continue oxycodone as needed 4. Mood: Provided emotional support  -antipsychotic agents: N/A 5. Neuropsych: This patient is capable of making decisions on her own behalf. 6. Skin/Wound Care: Routine skin checks 7. Fluids/Electrolytes/Nutrition: Routine in and outs with  follow-up chemistries 8.  Postoperative cardiopulmonary arrest/non-STEMI with history of CAD/CABG.  Patient did receive CPR x2 minutes.  Follow-up cardiology services.  Continue Ranexa 500 mg twice daily.  Plan outpatient cardiac  catheterization 9.  Acute on chronic anemia.  Continue iron supplement.  Follow-up CBC 10.  CKD stage III.  Baseline creatinine 1.42-1.56.  Follow-up chemistries 11.  Diastolic congestive heart failure.  Monitor for any signs of fluid overload 12.  Hypertension.  Aldactone 25 mg daily, Avapro 300 mg daily, labetalol 300 mg twice daily, HCTZ 12.5 mg daily.  Monitor with increased mobility 13.  Hyperlipidemia.  Zetia 14.  Obesity.  BMI 30.53.  Dietary follow-up  Cathlyn Parsons, PA-C   [name], MD 03/24/2022

## 2022-03-24 NOTE — TOC Progression Note (Deleted)
Transition of Care [facility]) - Progression Note    Patient Details  Name: Sabrina [name] MRN: [id number] Date of Birth: [birth year]  [facility] CM/SW [place], LCSW Phone Number: 03/24/2022, 1:48 PM  Clinical Narrative:         Barriers to Discharge: Barriers Resolved  Expected Discharge Plan and Services           Expected Discharge Date: 03/24/22                                     Social Determinants of Health (SDOH) Interventions    Readmission Risk Interventions     View : No data to display.

## 2022-03-24 NOTE — Discharge Summary (Addendum)
Physician Discharge Summary   Patient: Sabrina [name] MRN: [id number] DOB: Jul 02, 1941  Admit date:     03/17/2022  Discharge date: 03/24/22  Discharge Physician: Sharen Hones   PCP: Crecencio Mc, MD   Recommendations at discharge:   Follow-up with PCP as outpatient. Follow-up with cardiology as scheduled.  Discharge Diagnoses: Principal Problem:   Atherosclerosis of artery of extremity with rest pain ([place]) Active Problems:   Chronic kidney disease, stage 3a ([place])   Cardiac arrest ([place])   Respiratory arrest ([place])   Other specified hypothyroidism   Anxiety and depression   Normocytic anemia   PAD (peripheral artery disease) ([place])   NSTEMI (non-ST elevated myocardial infarction) ([place])   Anemia of chronic disease   Acute respiratory failure with hypoxia and hypercapnia ([facility])  Resolved Problems:   * No resolved hospital problems. [facility] Course: Sabrina [name] is a 81 y.o. female with a PMH significant for PAD, HTN, HLD, PVD, CAD s/p CABG, aortic stenosis, pulmonary HTN, GAD, chronic hip pain, GERD, hypothyroidism, CKD III.   They presented from home to the [facility] on 03/17/2022 for bilateral lower extremity endarterectomy in treatment of severe PAD and claudication.   5/24 b/l femoral endarterectomy and stenting 5/24 post procedure, experienced acute cardiac arrest, placed on MV support 5/24 extubated 5/25 identified ACS, started on heparin gtt 5/27 heparin drip stopped. 1 unit pRBCs transfused   03/23/22 -stable, ready for dc to CIR when bed available.   Assessment and Plan: Acute respiratory failure with hypoxemia and hypercapnia requiring intubation. Postop cardiac arrest secondary to non-STEMI. Non-STEMI. Dyslipidemia PAD status post bilateral lower extremity endarterectomy. Condition developed after vascular intervention.  She was treated in ICU and required intubation.  Condition was thought to be secondary to acute coronary syndrome with non-STEMI.   Patient is treated with dual antiplatelet treatment, beta-blocker.  Patient also has intolerance to statin, please consider alternative antilipid treatment after seen by cardiology as outpatient. Patient condition has improved, currently off oxygen.   She will be sent to CIR for acute rehab.  Anemia of chronic disease. Patient required blood transfusion in the hospital.  Hemoglobin has been stable since then.  Anxiety depression. Can resume home medicines.  Chronic kidney disease stage IIIa. AKI ruled out. I reviewed patient chart, patient does not have AKI.      Consultants: ICU, cardiology, vascular surgery. Procedures performed: Mechanical ventilation. Disposition: Rehabilitation facility Diet recommendation:  Discharge Diet Orders (From admission, onward)     Start     Ordered   03/24/22 0000  Diet - low sodium heart healthy        03/24/22 1209           Cardiac diet DISCHARGE MEDICATION: Allergies as of 03/24/2022       Reactions   Citalopram Anaphylaxis   Throat closing    Dilaudid [hydromorphone Hcl] Nausea And Vomiting   Hydrochlorothiazide Other (See Comments)   Decreased GFR (Nov 2015)   Liothyronine    Hair fell out, caused headaches    Nsaids    CKD stage III - avoid nephrotoxic drugs   Nubain [nalbuphine Hcl]    Burning sensation in back   Penicillins Itching   Prasugrel Itching   Statins Itching        Medication List     STOP taking these medications    butalbital-acetaminophen-caffeine 50-325-40 MG tablet Commonly known as: FIORICET   hydrALAZINE 100 MG tablet Commonly known as: APRESOLINE   valsartan 320 MG  tablet Commonly known as: DIOVAN       TAKE these medications    aspirin 81 MG tablet Take 81 mg by mouth daily.   clopidogrel 75 MG tablet Commonly known as: PLAVIX Take 1 tablet (75 mg total) by mouth daily. To prevent strokes   docusate sodium 100 MG capsule Commonly known as: Colace Take 1 capsule (100 mg  total) by mouth daily as needed for mild constipation or moderate constipation.   ezetimibe 10 MG tablet Commonly known as: ZETIA Take 1 tablet (10 mg total) by mouth daily.   ferrous sulfate 325 (65 FE) MG EC tablet Take 1 tablet (325 mg total) by mouth 2 (two) times daily with a meal.   labetalol 300 MG tablet Commonly known as: NORMODYNE Take 1 tablet (300 mg total) by mouth 2 (two) times daily. For hypertension   losartan-hydrochlorothiazide 50-12.5 MG tablet Commonly known as: Hyzaar Take 1 tablet by mouth daily.   nortriptyline 10 MG capsule Commonly known as: PAMELOR Take 1 capsule (10 mg total) by mouth 2 (two) times daily. For chronic headaches   Omega 3 1000 MG Caps Take 1,000 mg by mouth 2 (two) times daily.   pantoprazole 40 MG tablet Commonly known as: PROTONIX Take 1 tablet (40 mg total) by mouth daily.   ranolazine 500 MG 12 hr tablet Commonly known as: RANEXA Take 1 tablet (500 mg total) by mouth 2 (two) times daily.   spironolactone 25 MG tablet Commonly known as: ALDACTONE Take 1 tablet (25 mg total) by mouth daily. Start taking on: March 25, 2022   traMADol 50 MG tablet Commonly known as: ULTRAM Take 1 tablet (50 mg total) by mouth every 6 (six) hours as needed.   Vitamin C 500 MG Caps Take 500 mg by mouth daily.               Discharge Care Instructions  (From admission, onward)           Start     Ordered   03/24/22 0000  Discharge wound care:       Comments: Follow with RN in acute rehab   03/24/22 1209            Follow-up Information     Call[place], Loran Senters, MD. Go in 1 week(s).   Specialties: Cardiology, Internal Medicine Contact information: [place] [address] [phone]         Crecencio Mc, MD Follow up in 1 week(s).   Specialty: Internal Medicine Contact information: [place] [address] [phone]                Discharge Exam: Danley Danker  Weights   03/17/22 0631 03/17/22 1428  Weight: 71.7 kg 73.3 kg   General exam: Appears calm and comfortable  Respiratory system: Clear to auscultation. Respiratory effort normal. Cardiovascular system: S1 & S2 heard, RRR. No JVD, murmurs, rubs, gallops or clicks. No pedal edema. Gastrointestinal system: Abdomen is nondistended, soft and nontender. No organomegaly or masses felt. Normal bowel sounds heard. Central nervous system: Alert and oriented x2. No focal neurological deficits. Extremities: Symmetric 5 x 5 power. Skin: No rashes, lesions or ulcers Psychiatry: Judgement and insight appear normal. Mood & affect appropriate.    Condition at discharge: good  The results of significant diagnostics from this hospitalization (including imaging, microbiology, ancillary and laboratory) are listed below for reference.   Imaging Studies: MR Angiogram Head Wo Contrast  Result Date: 03/03/2022  CLINICAL DATA:  Follow-up examination for suspected ICA aneurysm. EXAM: MRA HEAD WITHOUT CONTRAST TECHNIQUE: Angiographic images of the Circle of Willis were acquired using MRA technique without intravenous contrast. COMPARISON:  Comparison made with recent brain MRI from 02/18/2022. FINDINGS: Anterior circulation: Visualized cervical left ICA widely patent with antegrade flow. Horizontal petrous left ICA widely patent as well. Atheromatous irregularity throughout the left carotid siphon without high-grade stenosis. 2.5 mm saccular outpouching arising from the cavernous left ICA consistent with a small aneurysm (series 5, image 98). This is directed medially. Additional lobulated irregular aneurysm measuring up to 3.8 mm extending posteriorly from the supraclinoid left ICA consistent with a left posterior communicating artery aneurysm (series 5, image 119). This accounts for the abnormality seen on prior brain MRI. The cervical right ICA is largely occluded with only intermittent and severely attenuated flow seen  through the petrous segment. Distal reconstitution at the supraclinoid right ICA via collateral flow across the circle-of-Willis and/or the of the left posterior communicating artery. A1 segments patent. Right A1 hypoplastic. Normal anterior communicating artery complex. Both ACAs patent to their distal aspects without stenosis. Both M1 segments patent without stenosis. Normal right MCA bifurcation. There is a 2.4 mm saccular aneurysm arising from the left MCA bifurcation (series 5, image 143). This is directed medially. Distal MCA branches well perfused bilaterally. Posterior circulation: Right vertebral artery dominant and patent to the vertebrobasilar junction without stenosis. Right PICA grossly patent at its origin. Left vertebral artery widely patent as it courses into the cranial vault. Left PICA patent. There is a focal severe stenosis involving the left V4 segment beyond the takeoff of the left PICA (series 5, image 28). Left V4 segment otherwise remains patent to the vertebrobasilar junction. Basilar patent to its distal aspect without stenosis. Anterior inferior cerebellar arteries patent. Superior cerebellar arteries patent bilaterally. Both PCA supplied via the basilar as well as bilateral posterior communicating arteries. Focal mild stenosis noted at the origin of the left P1 segment. PCAs otherwise well perfused to their distal aspects. Anatomic variants: Hypoplastic right A1 segment. Dominant right vertebral artery. Other: None. IMPRESSION: 1. 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 2. 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 3. 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 4. 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 5. Severe left V4 stenosis just beyond the takeoff of the left PICA. Dominant right vertebral artery widely patent. Electronically Signed   By: Jeannine Boga M.D.   On:  03/03/2022 06:34   DG C-Arm 1-60 Min-No Report  Result Date: 03/17/2022 Fluoroscopy was utilized by the requesting physician.  No radiographic interpretation.   ECHOCARDIOGRAM COMPLETE  Result Date: 03/18/2022    ECHOCARDIOGRAM REPORT   Patient Name:   Sabrina [name] Date of Exam: 03/18/2022 Medical Rec #:  [id number]          Height:       61.0 in Accession #:    [id number]         Weight:       161.6 lb Date of Birth:  06-30-1941          BSA:          1.725 m Patient Age:    81 years           BP:           133/64 mmHg Patient Gender: F  HR:           73 bpm. Exam Location:  [facility] Procedure: 2D Echo, Cardiac Doppler and Color Doppler Indications:     Cardiac arrest I46.9  History:         Patient has no prior history of Echocardiogram examinations.                  CHF, CAD, Prior CABG; Risk Factors:Hypertension.  Sonographer:     Sherrie Sport Referring Phys:  [id number] Flora Lipps Diagnosing Phys: Donnelly Angelica  Sonographer Comments: Technically challenging study due to limited acoustic windows, no apical window and no subcostal window. IMPRESSIONS  1. Left ventricular ejection fraction, by estimation, is 60 to 65%. The left ventricle has normal function. The left ventricle has no regional wall motion abnormalities. There is moderate left ventricular hypertrophy. Left ventricular diastolic function  could not be evaluated.  2. The mitral valve is abnormal. Trivial mitral valve regurgitation. Moderate mitral annular calcification.  3. The aortic valve is calcified. Aortic valve regurgitation is not visualized. Conclusion(s)/Recommendation(s): Very limited study. Normal LV function. Unable to assess for mitral stensos which previously was moderate to severe. Unable to assess for AS, and no TR velocity obtained to assess RVSP. FINDINGS  Left Ventricle: Left ventricular ejection fraction, by estimation, is 60 to 65%. The left ventricle has normal function. The left ventricle has no regional wall  motion abnormalities. The left ventricular internal cavity size was normal in size. There is  moderate left ventricular hypertrophy. Left ventricular diastolic function could not be evaluated. Pericardium: There is no evidence of pericardial effusion. Mitral Valve: The mitral valve is abnormal. Moderate mitral annular calcification. Trivial mitral valve regurgitation. Tricuspid Valve: The tricuspid valve is normal in structure. Tricuspid valve regurgitation is mild. Aortic Valve: The aortic valve is calcified. Aortic valve regurgitation is not visualized. Pulmonic Valve: The pulmonic valve was not well visualized. Pulmonic valve regurgitation is not visualized. No evidence of pulmonic stenosis. Aorta: The aortic root is normal in size and structure.  LEFT VENTRICLE PLAX 2D LVIDd:         3.80 cm LVIDs:         2.10 cm LV PW:         1.30 cm LV IVS:        1.30 cm LVOT diam:     2.00 cm LVOT Area:     3.14 cm  LEFT ATRIUM         Index LA diam:    3.90 cm 2.26 cm/m                        PULMONIC VALVE AORTA                 PV Vmax:        0.85 m/s Ao Root diam: 2.90 cm PV Vmean:       48.900 cm/s                       PV VTI:         0.124 m                       PV Peak grad:   2.9 mmHg                       PV Mean grad:   1.0 mmHg  RVOT Peak grad: 6 mmHg   SHUNTS Systemic Diam: 2.00 cm Pulmonic VTI:  0.241 m Donnelly Angelica Electronically signed by Donnelly Angelica Signature Date/Time: 03/18/2022/10:44:10 AM    Final     Microbiology: Results for orders placed or performed during the hospital encounter of 03/17/22  MRSA Next Gen by PCR, Nasal     Status: None   Collection Time: 03/17/22  2:30 PM   Specimen: Nasal Mucosa; Nasal Swab  Result Value Ref Range Status   MRSA by PCR Next Gen NOT DETECTED NOT DETECTED Final    Comment: (NOTE) The GeneXpert MRSA Assay (FDA approved for NASAL specimens only), is one component of a comprehensive MRSA colonization surveillance program. It is not  intended to diagnose MRSA infection nor to guide or monitor treatment for MRSA infections. Test performance is not FDA approved in patients less than 92 years old. Performed at [facility], [address] [place], [place] [address]   Aerobic/Anaerobic Culture w Gram Stain (surgical/deep wound)     Status: None (Preliminary result)   Collection Time: 03/23/22  2:28 PM   Specimen: Wound  Result Value Ref Range Status   Specimen Description   Final    WOUND Performed at [facility], [address], [place], [place] [address]    Special Requests   Final    GROIN RIGHT Performed at [facility], [address] [place], [place] [address]    Gram Stain   Final    FEW WBC PRESENT,BOTH PMN AND MONONUCLEAR MODERATE GRAM NEGATIVE RODS Performed at [facility], [place] [address], [place], [place] [address]    Culture MODERATE GRAM NEGATIVE RODS  Final   Report Status PENDING  Incomplete    Labs: CBC: Recent Labs  Lab 03/17/22 1406 03/18/22 0007 03/20/22 0425 03/20/22 1505 03/21/22 0346 03/22/22 0441 03/23/22 0602  WBC 8.7   < > 8.8 10.2 9.3 9.3 7.2  NEUTROABS 7.3  --   --   --   --   --   --   HGB 9.1*   < > 7.5* 9.5* 8.7* 9.2* 8.3*  HCT 27.9*   < > 23.6* 28.7* 26.0* 28.8* 25.6*  MCV 87.5   < > 89.1 87.8 87.8 89.7 89.2  PLT 213   < > 167 176 177 239 235   < > = values in this interval not displayed.   Basic Metabolic Panel: Recent Labs  Lab 03/17/22 1406 03/18/22 0007 03/19/22 0449 03/20/22 0425 03/21/22 0346 03/23/22 0602 03/24/22 0503  NA 134* 136 137 135 136 138  --   K 5.4* 3.9 3.7 3.7 4.0 4.4  --   CL 106 108 109 105 106 105  --   CO2 21* 24 23 24 25 26   --   GLUCOSE 236* 118* 104* 106* 103* 102*  --   BUN 20 18 16 19 23 23   --   CREATININE 1.52* 1.49* 1.42* 1.50* 1.56* 1.43* 1.59*  CALCIUM 8.1* 8.0* 8.2* 8.4* 8.5* 8.9  --   MG 1.9 1.9  --   --   --   --   --   PHOS 5.0*  --   --   --   --   --   --    Liver Function  Tests: Recent Labs  Lab 03/17/22 1406 03/19/22 0449  AST 76* 68*  ALT 57* 40  ALKPHOS 50 42  BILITOT 0.7 0.8  PROT 5.6* 5.8*  ALBUMIN 2.7* 2.6*  CBG: Recent Labs  Lab 03/17/22 1358 03/17/22 1427 03/17/22 1607 03/17/22 1929 03/17/22 2341  GLUCAP 243* 224* 173* 73 126*    Discharge time spent: greater than 30 minutes.  Signed: Sharen Hones, MD [facility] 03/24/2022

## 2022-03-24 NOTE — Progress Notes (Signed)
[name], MD  Physician Physical Medicine and Rehabilitation PMR Pre-admission    Signed Date of Service:  03/24/2022 10:23 AM  Related encounter: Admission (Current) from 03/17/2022 in [place]   Signed      Show:Clear all [x] Written[x] Templated[x] Copied  Added by: [x] Cristina Gong, RN[x] Raulkar, Clide Deutscher, MD  [] Hover for details                                                                                                                                                                                                                                                                                                                                                                                                                         PMR Admission Coordinator Pre-Admission Assessment   Patient: Sabrina [name] is an 81 y.o., female MRN: [id number] DOB: 11/21/1940 Height: 5\' 1"  (154.9 cm) Weight: 73.3 kg   Insurance Information HMO:     PPO:      PCP:      IPA:      80/20:      OTHER:  PRIMARY: Medicare a and b      Policy#: [id number]      Subscriber: pt Benefits:  Phone #: passport one source     Name: 03/23/22 Eff. Date: 01/24/2007     Deduct: $1600      Out of Pocket Max: none      Life Max: none CIR: 100%      SNF:  20 full days Outpatient: 80%     Co-Pay: 20% Home Health: 100%      Co-Pay: none DME: 80%     Co-Pay: 20% Providers: pt choice  SECONDARY: BCBS supplement      Policy#: [id number]   Financial Counselor:       Phone#:    The "Data Collection Information Summary" for patients in Inpatient Rehabilitation Facilities with attached "Privacy Act [place] Records" was provided and verbally reviewed with: Patient and Family   Emergency Contact Information Contact  Information       Name Relation Home Work Mobile    [place] Son [phone]   [phone]    Sabrina, [name] [phone]        [name] Daughter     [phone]         Current Medical History  Patient Admitting Diagnosis: Debility   History of Present Illness:  81 year old right-handed female with history of obesity with BMI 30.53, chronic hip pain, chronic anemia, CAD/CABG x3 with LIMA-LAD, SVG-distal RCA and SVG-ramus intermedius 03/2000 with recent heart cath 11/18/2021 showing extensive native CAD and 100% occlusion SVG-RCA and 90%, distal SVG-OM with significant stenosis of her left subclavian status post left subclavian stenting 11/23/2021 maintained on low-dose aspirin, diastolic congestive heart failure, carotid artery angioplasty 2004, CKD stage III, hypertension, hyperlipidemia, peripheral vascular disease with multiple interventions in the past.      Presented to [facility] 03/17/2022 for elective bilateral superior femoral artery and left iliac stenting with vascular surgery due to peripheral vascular disease and debilitating claudication with some mild rest pain.  Underwent left common femoral, profunda femoris, and superficial femoral artery endarterectomies as well as right common femoral profunda femoris and superficial femoral artery enterectomy's stent placement to left external iliac artery with 8 mm diameter by 4 cm length of life star stent 03/17/2022 per Dr. Leotis Pain.    Postoperative acute respiratory failure cardiac arrest requiring CPR x2 minutes.  To have markedly elevated troponin of 15,000 with Cardiology felt suggesting a perioperative MI. Ongoing discussions concerning potential cardiac catherization.  She did require intubation for airway protection and extubated same day.  Echocardiogram with ejection fraction of 60 to 65% no wall motion abnormalities.  She is currently maintained on aspirin 81 mg daily and Plavix 75 mg daily for at least 12 months and  plan to discuss outpatient cardiac catheterization.  Ranexa ongoing at 500 mg twice daily per cardiology services Dr. Lujean Amel.  Ongoing wound care bilateral groin dressings with wound VACS removed 03/22/2022.  Noted 5/30 with drainage form right groin with foul smelling odor. Vascular ordered cultures. She is tolerating a regular diet.  Acute on chronic anemia 8.3 and monitored she was transfused 1 unit packed red blood cells 5/27.     Patient's medical record from [facility] has been reviewed by the rehabilitation admission coordinator and physician.   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 b.) TTE 11/16/2021: EF >55%; mild AS (MPG 9 mmHg)   CAD (coronary artery  disease)      a.) s/p 3v CABG 03/29/2000   Carotid artery stenosis      a.) s/p LEFT CEA 09/09/2003. b.) Carotid doppler 76/16/0737: 1-06% LICA, CTO RICA; subclavian stenosis   Cataracts, bilateral     Cervical spondylosis without myelopathy     Chronic diastolic CHF (congestive heart failure), NYHA class 3 (HCC)      a.) TTE 05/27/2016: EF >55%, mild LA enlargement, triv PR, mild MR, mod TR; G3DD. b.) TTE 12/12/2017: EF >55%, mild LVH, BAE, mild MR/PR, mod TR; RVSP 52.8 mmHg. c.) TTE 03/18/2020: EF >55%, BAD, AS (MPG 8.7 mmHg); triv MR, mild TR/PR. d.) TTE 11/16/2021: EF >55%, LVH, G1DD, triv MR, mild PR, mod TR; AS (MPG 9 mmHg); MS (MPG 5 mmHg)   Chronic kidney disease, stage III (moderate) (HCC)     Chronic narcotic use 06/24/2014   Chronic pain syndrome     GERD (gastroesophageal reflux disease)      History of 2019 novel coronavirus disease (COVID-19) 09/22/2021   Hyperlipidemia     Hypertension     Long term current use of antithrombotics/antiplatelets      a.) on daily DAPT therapy (ASA + clopidogrel)   Lumbar stenosis with neurogenic claudication     Mitral stenosis 11/16/2021    a.) TTE 11/16/2021: EF >55%; mod MS (MPG 5 mmHg)   Osteoarthritis of hip     Pulmonary hypertension ([place]) 12/12/2017    a.) TTE 12/12/2017: mild; RVSP 52.8 mmHg   PVD (peripheral vascular disease) (HCC)     Renal artery stenosis (HCC)     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    Has the patient had major surgery during 100 days prior to admission? Yes   Family History   family history includes Breast cancer (age of onset: 58) in her paternal aunt; Cerebral aneurysm in her son and son; Diabetes in her mother; Heart disease in her sister; Hypertension in her father and mother; Multiple sclerosis in her daughter and son; Seizures in her son; Stroke in her mother.   Current Medications   Current Facility-Administered Medications:    acetaminophen (TYLENOL) tablet 325-650 mg, 325-650 mg, Oral, Q4H PRN, 325 mg at 03/21/22 0101 **OR** acetaminophen (TYLENOL) suppository 325-650 mg, 325-650 mg, Rectal, Q4H PRN, Schnier, [name], MD   alum & mag hydroxide-simeth (MAALOX/MYLANTA) 200-200-20 MG/5ML suspension 15-30 mL, 15-30 mL, Oral, Q2H PRN, Schnier, [name], MD   aspirin chewable tablet 81 mg, 81 mg, Oral, Daily, Tang, Lily Michelle, PA-C, 81 mg at 03/24/22 1003   clopidogrel (PLAVIX) tablet 75 mg, 75 mg, Oral, Daily, Schnier, [name], MD, 75 mg at 03/24/22 1003   ezetimibe (ZETIA) tablet 10 mg, 10 mg, Oral, Daily, Schnier, [name], MD, 10 mg at 03/24/22 1004   ferrous sulfate  tablet 325 mg, 325 mg, Oral, Q breakfast, Doristine Mango L, MD, 325 mg at 03/24/22 1004   guaiFENesin-dextromethorphan (ROBITUSSIN DM) 100-10 MG/5ML syrup 15 mL, 15 mL, Oral, Q4H PRN, Schnier, [name], MD   hydrochlorothiazide (HYDRODIURIL) tablet 12.5 mg, 12.5 mg, Oral, Daily, Darrick Penna, RPH, 12.5 mg at 03/24/22 1003   HYDROmorphone (DILAUDID) injection 0.5 mg, 0.5 mg, Intravenous, Q4H PRN, Richarda Osmond,  MD   irbesartan (AVAPRO) tablet 300 mg, 300 mg, Oral, Daily, Schnier, [name], MD, 300 mg at 03/24/22 1003   labetalol (NORMODYNE) tablet 300 mg, 300 mg, Oral, BID, Schnier, [name], MD, 300 mg at 03/24/22 1003   magnesium hydroxide (MILK OF MAGNESIA) suspension 15 mL, 15 mL, Oral, Daily PRN, Richarda Osmond, MD, 15 mL at 03/23/22 0857   magnesium sulfate IVPB 2 g 50 mL, 2 g, Intravenous, Daily PRN, Schnier, [name], MD   menthol-cetylpyridinium (CEPACOL) lozenge 3 mg, 1 lozenge, Oral, PRN, Richarda Osmond, MD, 3 mg at 03/21/22 1035   nortriptyline (PAMELOR) capsule 10 mg, 10 mg, Oral, BID, Schnier, [name], MD, 10 mg at 03/24/22 1004   ondansetron (ZOFRAN) injection 4 mg, 4 mg, Intravenous, Q6H PRN, Schnier, [name], MD   oxyCODONE-acetaminophen (PERCOCET/ROXICET) 5-325 MG per tablet 1-2 tablet, 1-2 tablet, Oral, Q4H PRN, Schnier, [name], MD, 2 tablet at 03/24/22 0736   phenol (CHLORASEPTIC) mouth spray 1 spray, 1 spray, Mouth/Throat, PRN, Schnier, [name], MD   ranolazine (RANEXA) 12 hr tablet 500 mg, 500 mg, Oral, BID, Schnier, [name], MD, 500 mg at 03/24/22 1005   spironolactone (ALDACTONE) tablet 25 mg, 25 mg, Oral, Daily, Richarda Osmond, MD, 25 mg at 03/24/22 1004   Patients Current Diet:  Diet Order                  Diet - low sodium heart healthy             Diet regular Room service appropriate? Yes; Fluid consistency: Thin  Diet effective now                       Precautions / Restrictions Precautions Precautions:  Fall Restrictions Weight Bearing Restrictions: No Other Position/Activity Restrictions: pt with BLE groin wound vacs    Has the patient had 2 or more falls or a fall with injury in the past year? No   Prior Activity Level Community (5-7x/wk): Mod I and driving   Prior Functional Level Self Care: Did the patient need help bathing, dressing, using the toilet or eating? Independent   Indoor Mobility: Did the patient need assistance with walking from room to room (with or without device)? Independent   Stairs: Did the patient need assistance with internal or external stairs (with or without device)? Independent   Functional Cognition: Did the patient need help planning regular tasks such as shopping or remembering to take medications? Independent   Patient Information Are you of Hispanic, Latino/a,or Spanish origin?: A. No, not of Hispanic, Latino/a, or Spanish origin What is your race?: B. Black or African American Do you need or want an interpreter to communicate with a doctor or health care staff?: 0. No   Patient's Response To:  Health Literacy and Transportation Is the patient able to respond to health literacy and transportation needs?: Yes Health Literacy - How often do you need to have someone help you when you read instructions, pamphlets, or other written material from your doctor or pharmacy?: Never In the past 12 months, has lack of transportation kept you from medical appointments or from getting medications?: No In the past 12 months, has lack of transportation kept you from meetings, work, or from getting things needed for daily living?: No   Development worker, international aid / [place] Devices/Equipment: Eyeglasses Home Equipment: Conservation officer, nature (2 wheels), [employer] - single point   Prior Device Use: Indicate devices/aids used  by the patient prior to current illness, exacerbation or injury? NAD   Current Functional Level Cognition   Overall Cognitive Status: Within  Functional Limits for tasks assessed Orientation Level: Oriented X4 General Comments: sweet lady    Extremity Assessment (includes Sensation/Coordination)   Upper Extremity Assessment: Generalized weakness  Lower Extremity Assessment: Generalized weakness     ADLs   Overall ADL's : Needs assistance/impaired General ADL Comments: MIN A + HHA for ADL t/f ~40 ft. CGA + BUE support during grooming tasks.     Mobility   Overal bed mobility: Needs Assistance Bed Mobility: Supine to Sit Supine to sit: Min assist, Min guard General bed mobility comments: slow and labored effort with getting to EOB, but able to rise w/ only light use of PT's UEs to attain upright sitting     Transfers   Overall transfer level: Needs assistance Equipment used: Rolling walker (2 wheels) Transfers: Sit to/from Stand Sit to Stand: Min assist Bed to/from chair/wheelchair/BSC transfer type:: Step pivot Step pivot transfers: Mod assist, +2 physical assistance, +2 safety/equipment General transfer comment: Pt able to initiate upward movement but did need light assist to insure hips stayed foraward and she ultimately attained upright.     Ambulation / Gait / Stairs / Wheelchair Mobility   Ambulation/Gait Ambulation/Gait assistance: Herbalist (Feet): 200 Feet Assistive device: Rolling walker (2 wheels) Gait Pattern/deviations: Step-through pattern, Decreased step length - right, Decreased step length - left General Gait Details: Slow and guarded but with increasing confidence and speed after "warming up"  she did need to take a few brief standing rest breaks and became fatigued (and slower with more prolonged effort) but ultimately did much more walking that she has in recent days. Gait velocity: decreased     Posture / Balance Balance Overall balance assessment: Needs assistance Sitting-balance support: Feet supported, Bilateral upper extremity supported Sitting balance-Leahy Scale:  Good Postural control: Right lateral lean, Posterior lean Standing balance support: Bilateral upper extremity supported, During functional activity Standing balance-Leahy Scale: Fair     Special needs/care consideration Surgical wounds    Previous Home Environment  Living Arrangements: Alone  Lives With: Alone Available Help at Discharge: Family, Available 24 hours/day Type of Home: House Home Layout: One level Home Access: Level entry Bathroom Shower/Tub: Chiropodist: Standard Bathroom Accessibility: Yes How Accessible: Accessible via walker [place]: No Additional Comments: Daughter form [place] to assist as needed   Discharge Living Setting Plans for Discharge Living Setting: Patient's home, Alone Type of Home at Discharge: House Discharge Home Layout: One level Discharge Home Access: Level entry Discharge Bathroom Shower/Tub: Tub/shower unit Discharge Bathroom Toilet: Standard Discharge Bathroom Accessibility: Yes How Accessible: Accessible via walker Does the patient have any problems obtaining your medications?: No   Social/Family/Support Systems Patient Roles: Parent Contact Information: daughter, [name] Anticipated Caregiver: daughter and family Anticipated Caregiver's Contact Information: see contacts Ability/Limitations of Caregiver: Daughter visiting from [place] Availability: Other (Comment) Discharge Plan Discussed with Primary Caregiver: Yes Is Caregiver In Agreement with Plan?: Yes Does Caregiver/Family have Issues with Lodging/Transportation while Pt is in Rehab?: No   Goals Patient/Family Goal for Rehab: Mod I to intermittent supervision with PT and OT Expected length of stay: ELOS 10 to 12 days Pt/Family Agrees to Admission and willing to participate: Yes Program Orientation Provided & Reviewed with Pt/Caregiver Including Roles  & Responsibilities: Yes   Decrease burden of Care through IP rehab admission:  n/a   Possible need  for SNF placement upon discharge: not anticipated   Patient Condition: I have reviewed medical records from [facility], spoken with CSW, and patient and daughter. I discussed via phone for inpatient rehabilitation assessment.  Patient will benefit from ongoing PT and OT, can actively participate in 3 hours of therapy a day 5 days of the week, and can make measurable gains during the admission.  Patient will also benefit from the coordinated team approach during an Inpatient Acute Rehabilitation admission.  The patient will receive intensive therapy as well as Rehabilitation physician, nursing, social worker, and care management interventions.  Due to bladder management, bowel management, safety, skin/wound care, disease management, medication administration, pain management, and patient education the patient requires 24 hour a day rehabilitation nursing.  The patient is currently min assist overall with mobility and basic ADLs.  Discharge setting and therapy post discharge at home with home health is anticipated.  Patient has agreed to participate in the Acute Inpatient Rehabilitation Program and will admit today.   Preadmission Screen Completed By:  Cleatrice Burke, 03/24/2022 12:46 PM ______________________________________________________________________   Discussed status with Dr. Ranell Patrick on 03/24/22 at 1233 and received approval for admission today.   Admission Coordinator:  Cleatrice Burke, RN, time  2800 Date 03/24/2022    Assessment/Plan: Diagnosis: Debility Does the need for close, 24 hr/day Medical supervision in concert with the patient's rehab needs make it unreasonable for this patient to be served in a less intensive setting? Yes Co-Morbidities requiring supervision/potential complications: atherosclerosis of artery of extremity with rest pain, chronic kidney disease stage 3a, cardiac arrest, respiratory arrest, anxiety and depression Due to bladder management,  bowel management, safety, skin/wound care, disease management, medication administration, pain management, and patient education, does the patient require 24 hr/day rehab nursing? Yes Does the patient require coordinated care of a physician, rehab nurse, PT, OT to address physical and functional deficits in the context of the above medical diagnosis(es)? Yes Addressing deficits in the following areas: balance, endurance, locomotion, strength, transferring, bowel/bladder control, bathing, dressing, feeding, grooming, toileting, and psychosocial support Can the patient actively participate in an intensive therapy program of at least 3 hrs of therapy 5 days a week? Yes The potential for patient to make measurable gains while on inpatient rehab is excellent Anticipated functional outcomes upon discharge from inpatient rehab: modified independent PT, modified independent OT, modified independent SLP Estimated rehab length of stay to reach the above functional goals is: 5-7 days Anticipated discharge destination: Home 10. Overall Rehab/Functional Prognosis: excellent     MD Signature: Leeroy Cha, MD         Revision History                               Note Details  Author [name], MD File Time 03/24/2022 12:53 PM  Author Type Physician Status Signed  Last Editor [name], MD Service Physical Medicine and Rehabilitation

## 2022-03-24 NOTE — PMR Pre-admission (Signed)
PMR Admission Coordinator Pre-Admission Assessment  Patient: Sabrina [name] is an 81 y.o., female MRN: [id number] DOB: 1941/03/18 Height: 5\' 1"  (154.9 cm) Weight: 73.3 kg  Insurance Information HMO:     PPO:      PCP:      IPA:      80/20:      OTHER:  PRIMARY: Medicare a and b      Policy#: [id number]      Subscriber: pt Benefits:  Phone #: passport one source     Name: 03/23/22 Eff. Date: 01/24/2007     Deduct: $1600      Out of Pocket Max: none      Life Max: none CIR: 100%      SNF: 20 full days Outpatient: 80%     Co-Pay: 20% Home Health: 100%      Co-Pay: none DME: 80%     Co-Pay: 20% Providers: pt choice  SECONDARY: BCBS supplement      Policy#: [id number]  Financial Counselor:       Phone#:   The "Data Collection Information Summary" for patients in Inpatient Rehabilitation Facilities with attached "Privacy Act [place] Records" was provided and verbally reviewed with: Patient and Family  Emergency Contact Information Contact Information     Name Relation Home Work Mobile   Lawrence Son [phone]  [phone]   [place], [place] [phone]     [name] Daughter   [phone]      Current Medical History  Patient Admitting Diagnosis: Debility  History of Present Illness:  81 year old right-handed female with history of obesity with BMI 30.53, chronic hip pain, chronic anemia, CAD/CABG x3 with LIMA-LAD, SVG-distal RCA and SVG-ramus intermedius 03/2000 with recent heart cath 11/18/2021 showing extensive native CAD and 100% occlusion SVG-RCA and 90%, distal SVG-OM with significant stenosis of her left subclavian status post left subclavian stenting 11/23/2021 maintained on low-dose aspirin, diastolic congestive heart failure, carotid artery angioplasty 2004, CKD stage III, hypertension, hyperlipidemia, peripheral vascular disease with multiple interventions in the past.     Presented to [facility] 03/17/2022 for elective bilateral superior  femoral artery and left iliac stenting with vascular surgery due to peripheral vascular disease and debilitating claudication with some mild rest pain.  Underwent left common femoral, profunda femoris, and superficial femoral artery endarterectomies as well as right common femoral profunda femoris and superficial femoral artery enterectomy's stent placement to left external iliac artery with 8 mm diameter by 4 cm length of life star stent 03/17/2022 per Dr. Leotis Pain.    Postoperative acute respiratory failure cardiac arrest requiring CPR x2 minutes.  To have markedly elevated troponin of 15,000 with Cardiology felt suggesting a perioperative MI. Ongoing discussions concerning potential cardiac catherization.  She did require intubation for airway protection and extubated same day.  Echocardiogram with ejection fraction of 60 to 65% no wall motion abnormalities.  She is currently maintained on aspirin 81 mg daily and Plavix 75 mg daily for at least 12 months and plan to discuss outpatient cardiac catheterization.  Ranexa ongoing at 500 mg twice daily per cardiology services Dr. Lujean Amel.  Ongoing wound care bilateral groin dressings with wound VACS removed 03/22/2022.  Noted 5/30 with drainage form right groin with foul smelling odor. Vascular ordered cultures. She is tolerating a regular diet.  Acute on chronic anemia 8.3 and monitored she was transfused 1 unit packed red blood cells 5/27.    Patient's medical record from [facility] has been reviewed by the rehabilitation admission coordinator and physician.  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 b.) TTE 11/16/2021: EF >55%; mild AS (MPG 9 mmHg)   CAD (coronary artery disease)    a.) s/p 3v CABG 03/29/2000   Carotid artery stenosis    a.) s/p LEFT CEA 09/09/2003. b.) Carotid doppler 29/79/8921: 1-94% LICA, CTO RICA; subclavian stenosis   Cataracts, bilateral    Cervical spondylosis without myelopathy    Chronic diastolic CHF (congestive heart failure), NYHA class 3 (HCC)    a.) TTE 05/27/2016: EF >55%, mild LA enlargement, triv PR, mild MR, mod TR; G3DD. b.) TTE 12/12/2017: EF >55%, mild LVH, BAE, mild MR/PR, mod TR; RVSP 52.8 mmHg. c.) TTE 03/18/2020: EF >55%, BAD, AS (MPG 8.7 mmHg); triv MR, mild TR/PR. d.) TTE 11/16/2021: EF >55%, LVH, G1DD, triv MR, mild PR, mod TR; AS (MPG 9 mmHg); MS (MPG 5 mmHg)   Chronic kidney disease, stage III (moderate) (HCC)    Chronic narcotic use 06/24/2014   Chronic pain syndrome    GERD (gastroesophageal reflux disease)    History of 2019 novel coronavirus disease (COVID-19) 09/22/2021   Hyperlipidemia    Hypertension    Long term current use of antithrombotics/antiplatelets    a.) on daily DAPT therapy (ASA + clopidogrel)   Lumbar stenosis with neurogenic claudication    Mitral stenosis 11/16/2021   a.) TTE 11/16/2021: EF >55%; mod MS (MPG 5 mmHg)   Osteoarthritis of hip    Pulmonary hypertension ([place]) 12/12/2017   a.) TTE 12/12/2017: mild; RVSP 52.8 mmHg   PVD (peripheral vascular disease) (HCC)    Renal artery stenosis (HCC)    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   Has the patient had major surgery during 100 days prior to admission? Yes  Family History   family history includes Breast cancer (age of onset: 24) in her paternal aunt; Cerebral  aneurysm in her son and son; Diabetes in her mother; Heart disease in her sister; Hypertension in her father and mother; Multiple sclerosis in her daughter and son; Seizures in her son; Stroke in her mother.  Current Medications  Current Facility-Administered Medications:    acetaminophen (TYLENOL) tablet 325-650 mg, 325-650 mg, Oral, Q4H PRN, 325 mg at 03/21/22 0101 **OR** acetaminophen (TYLENOL) suppository 325-650 mg, 325-650 mg, Rectal, Q4H PRN, Schnier, [name], MD   alum & mag hydroxide-simeth (MAALOX/MYLANTA) 200-200-20 MG/5ML suspension 15-30 mL, 15-30 mL, Oral, Q2H PRN, Schnier, [name], MD   aspirin chewable tablet 81 mg, 81 mg, Oral, Daily, Tang, Lily Michelle, PA-C, 81 mg at 03/24/22 1003   clopidogrel (PLAVIX) tablet 75 mg, 75 mg, Oral, Daily, Schnier, [name], MD, 75 mg at 03/24/22 1003   ezetimibe (ZETIA) tablet 10 mg, 10 mg, Oral, Daily, Schnier, [name], MD, 10 mg at 03/24/22 1004   ferrous sulfate tablet 325 mg,  325 mg, Oral, Q breakfast, Doristine Mango L, MD, 325 mg at 03/24/22 1004   guaiFENesin-dextromethorphan (ROBITUSSIN DM) 100-10 MG/5ML syrup 15 mL, 15 mL, Oral, Q4H PRN, Schnier, [name], MD   hydrochlorothiazide (HYDRODIURIL) tablet 12.5 mg, 12.5 mg, Oral, Daily, Darrick Penna, RPH, 12.5 mg at 03/24/22 1003   HYDROmorphone (DILAUDID) injection 0.5 mg, 0.5 mg, Intravenous, Q4H PRN, Richarda Osmond, MD   irbesartan (AVAPRO) tablet 300 mg, 300 mg, Oral, Daily, Schnier, [name], MD, 300 mg at 03/24/22 1003   labetalol (NORMODYNE) tablet 300 mg, 300 mg, Oral, BID, Schnier, [name], MD, 300 mg at 03/24/22 1003   magnesium hydroxide (MILK OF MAGNESIA) suspension 15 mL, 15 mL, Oral, Daily PRN, Richarda Osmond, MD, 15 mL at 03/23/22 0857   magnesium sulfate IVPB 2 g 50 mL, 2 g, Intravenous, Daily PRN, Schnier, [name], MD   menthol-cetylpyridinium (CEPACOL) lozenge 3 mg, 1 lozenge, Oral, PRN, Richarda Osmond, MD, 3 mg at 03/21/22 1035    nortriptyline (PAMELOR) capsule 10 mg, 10 mg, Oral, BID, Schnier, [name], MD, 10 mg at 03/24/22 1004   ondansetron (ZOFRAN) injection 4 mg, 4 mg, Intravenous, Q6H PRN, Schnier, [name], MD   oxyCODONE-acetaminophen (PERCOCET/ROXICET) 5-325 MG per tablet 1-2 tablet, 1-2 tablet, Oral, Q4H PRN, Schnier, [name], MD, 2 tablet at 03/24/22 0736   phenol (CHLORASEPTIC) mouth spray 1 spray, 1 spray, Mouth/Throat, PRN, Schnier, [name], MD   ranolazine (RANEXA) 12 hr tablet 500 mg, 500 mg, Oral, BID, Schnier, [name], MD, 500 mg at 03/24/22 1005   spironolactone (ALDACTONE) tablet 25 mg, 25 mg, Oral, Daily, Richarda Osmond, MD, 25 mg at 03/24/22 1004  Patients Current Diet:  Diet Order             Diet - low sodium heart healthy           Diet regular Room service appropriate? Yes; Fluid consistency: Thin  Diet effective now                  Precautions / Restrictions Precautions Precautions: Fall Restrictions Weight Bearing Restrictions: No Other Position/Activity Restrictions: pt with BLE groin wound vacs   Has the patient had 2 or more falls or a fall with injury in the past year? No  Prior Activity Level Community (5-7x/wk): Mod I and driving  Prior Functional Level Self Care: Did the patient need help bathing, dressing, using the toilet or eating? Independent  Indoor Mobility: Did the patient need assistance with walking from room to room (with or without device)? Independent  Stairs: Did the patient need assistance with internal or external stairs (with or without device)? Independent  Functional Cognition: Did the patient need help planning regular tasks such as shopping or remembering to take medications? Independent  Patient Information Are you of Hispanic, Latino/a,or Spanish origin?: A. No, not of Hispanic, Latino/a, or Spanish origin What is your race?: B. Black or African American Do you need or want an interpreter to communicate with a doctor or health  care staff?: 0. No  Patient's Response To:  Health Literacy and Transportation Is the patient able to respond to health literacy and transportation needs?: Yes Health Literacy - How often do you need to have someone help you when you read instructions, pamphlets, or other written material from your doctor or pharmacy?: Never In the past 12 months, has lack of transportation kept you from medical appointments or from getting medications?: No In the past 12  months, has lack of transportation kept you from meetings, work, or from getting things needed for daily living?: No  Development worker, international aid / [place] Devices/Equipment: Eyeglasses Home Equipment: Conservation officer, nature (2 wheels), [employer] - single point  Prior Device Use: Indicate devices/aids used by the patient prior to current illness, exacerbation or injury? NAD  Current Functional Level Cognition  Overall Cognitive Status: Within Functional Limits for tasks assessed Orientation Level: Oriented X4 General Comments: sweet lady    Extremity Assessment (includes Sensation/Coordination)  Upper Extremity Assessment: Generalized weakness  Lower Extremity Assessment: Generalized weakness    ADLs  Overall ADL's : Needs assistance/impaired General ADL Comments: MIN A + HHA for ADL t/f ~40 ft. CGA + BUE support during grooming tasks.    Mobility  Overal bed mobility: Needs Assistance Bed Mobility: Supine to Sit Supine to sit: Min assist, Min guard General bed mobility comments: slow and labored effort with getting to EOB, but able to rise w/ only light use of PT's UEs to attain upright sitting    Transfers  Overall transfer level: Needs assistance Equipment used: Rolling walker (2 wheels) Transfers: Sit to/from Stand Sit to Stand: Min assist Bed to/from chair/wheelchair/BSC transfer type:: Step pivot Step pivot transfers: Mod assist, +2 physical assistance, +2 safety/equipment General transfer comment: Pt able to initiate  upward movement but did need light assist to insure hips stayed foraward and she ultimately attained upright.    Ambulation / Gait / Stairs / Wheelchair Mobility  Ambulation/Gait Ambulation/Gait assistance: Herbalist (Feet): 200 Feet Assistive device: Rolling walker (2 wheels) Gait Pattern/deviations: Step-through pattern, Decreased step length - right, Decreased step length - left General Gait Details: Slow and guarded but with increasing confidence and speed after "warming up"  she did need to take a few brief standing rest breaks and became fatigued (and slower with more prolonged effort) but ultimately did much more walking that she has in recent days. Gait velocity: decreased    Posture / Balance Balance Overall balance assessment: Needs assistance Sitting-balance support: Feet supported, Bilateral upper extremity supported Sitting balance-Leahy Scale: Good Postural control: Right lateral lean, Posterior lean Standing balance support: Bilateral upper extremity supported, During functional activity Standing balance-Leahy Scale: Fair    Special needs/care consideration Surgical wounds   Previous Home Environment  Living Arrangements: Alone  Lives With: Alone Available Help at Discharge: Family, Available 24 hours/day Type of Home: House Home Layout: One level Home Access: Level entry Bathroom Shower/Tub: Chiropodist: Standard Bathroom Accessibility: Yes How Accessible: Accessible via walker [place]: No Additional Comments: Daughter form [place] to assist as needed  Discharge Living Setting Plans for Discharge Living Setting: Patient's home, Alone Type of Home at Discharge: House Discharge Home Layout: One level Discharge Home Access: Level entry Discharge Bathroom Shower/Tub: Tub/shower unit Discharge Bathroom Toilet: Standard Discharge Bathroom Accessibility: Yes How Accessible: Accessible via walker Does the patient have  any problems obtaining your medications?: No  Social/Family/Support Systems Patient Roles: Parent Contact Information: daughter, [name] Anticipated Caregiver: daughter and family Anticipated Caregiver's Contact Information: see contacts Ability/Limitations of Caregiver: Daughter visiting from [place] Availability: Other (Comment) Discharge Plan Discussed with Primary Caregiver: Yes Is Caregiver In Agreement with Plan?: Yes Does Caregiver/Family have Issues with Lodging/Transportation while Pt is in Rehab?: No  Goals Patient/Family Goal for Rehab: Mod I to intermittent supervision with PT and OT Expected length of stay: ELOS 10 to 12 days Pt/Family Agrees to Admission and willing to participate: Yes  Program Orientation Provided & Reviewed with Pt/Caregiver Including Roles  & Responsibilities: Yes  Decrease burden of Care through IP rehab admission: n/a  Possible need for SNF placement upon discharge: not anticipated  Patient Condition: I have reviewed medical records from [facility], spoken with CSW, and patient and daughter. I discussed via phone for inpatient rehabilitation assessment.  Patient will benefit from ongoing PT and OT, can actively participate in 3 hours of therapy a day 5 days of the week, and can make measurable gains during the admission.  Patient will also benefit from the coordinated team approach during an Inpatient Acute Rehabilitation admission.  The patient will receive intensive therapy as well as Rehabilitation physician, nursing, social worker, and care management interventions.  Due to bladder management, bowel management, safety, skin/wound care, disease management, medication administration, pain management, and patient education the patient requires 24 hour a day rehabilitation nursing.  The patient is currently min assist overall with mobility and basic ADLs.  Discharge setting and therapy post discharge at home with home health is anticipated.  Patient  has agreed to participate in the Acute Inpatient Rehabilitation Program and will admit today.  Preadmission Screen Completed By:  Cleatrice Burke, 03/24/2022 12:46 PM ______________________________________________________________________   Discussed status with Dr. Ranell Patrick on 03/24/22 at 1233 and received approval for admission today.  Admission Coordinator:  Cleatrice Burke, RN, time  5465 Date 03/24/2022   Assessment/Plan: Diagnosis: Debility Does the need for close, 24 hr/day Medical supervision in concert with the patient's rehab needs make it unreasonable for this patient to be served in a less intensive setting? Yes Co-Morbidities requiring supervision/potential complications: atherosclerosis of artery of extremity with rest pain, chronic kidney disease stage 3a, cardiac arrest, respiratory arrest, anxiety and depression Due to bladder management, bowel management, safety, skin/wound care, disease management, medication administration, pain management, and patient education, does the patient require 24 hr/day rehab nursing? Yes Does the patient require coordinated care of a physician, rehab nurse, PT, OT to address physical and functional deficits in the context of the above medical diagnosis(es)? Yes Addressing deficits in the following areas: balance, endurance, locomotion, strength, transferring, bowel/bladder control, bathing, dressing, feeding, grooming, toileting, and psychosocial support Can the patient actively participate in an intensive therapy program of at least 3 hrs of therapy 5 days a week? Yes The potential for patient to make measurable gains while on inpatient rehab is excellent Anticipated functional outcomes upon discharge from inpatient rehab: modified independent PT, modified independent OT, modified independent SLP Estimated rehab length of stay to reach the above functional goals is: 5-7 days Anticipated discharge destination: Home 10. Overall  Rehab/Functional Prognosis: excellent   MD Signature: Leeroy Cha, MD

## 2022-03-24 NOTE — Progress Notes (Signed)
Inpatient Rehabilitation Admissions Coordinator   I await medical clearance to admit patient to [facility] CIR/AIR rehab today. I have alerted acute team and TOC.  Danne Baxter, RN, MSN Rehab Admissions Coordinator [phone] 03/24/2022 10:36 AM

## 2022-03-24 NOTE — Plan of Care (Signed)

## 2022-03-24 NOTE — Progress Notes (Signed)
Patient arrived from [place] around 1430. appears alert and denies pain. Assigned to 2P49.

## 2022-03-24 NOTE — Progress Notes (Signed)
Patient ID: Sabrina [name], female   DOB: 1940-12-04, 81 y.o.   MRN: [id number]  NCPASSR#[phone] [id number] (per previous SW note-no issue with obtaining. Omit middle name).  Loralee Pacas, MSW, [place] Office: [phone] Cell: [phone] Fax: [phone]

## 2022-03-25 DIAGNOSIS — R5381 Other malaise: Secondary | ICD-10-CM | POA: Diagnosis not present

## 2022-03-25 LAB — COMPREHENSIVE METABOLIC PANEL
ALT: 16 U/L (ref 0–44)
AST: 18 U/L (ref 15–41)
Albumin: 2.7 g/dL — ABNORMAL LOW (ref 3.5–5.0)
Alkaline Phosphatase: 57 U/L (ref 38–126)
Anion gap: 7 (ref 5–15)
BUN: 25 mg/dL — ABNORMAL HIGH (ref 8–23)
CO2: 27 mmol/L (ref 22–32)
Calcium: 9.4 mg/dL (ref 8.9–10.3)
Chloride: 101 mmol/L (ref 98–111)
Creatinine, Ser: 1.89 mg/dL — ABNORMAL HIGH (ref 0.44–1.00)
GFR, Estimated: 27 mL/min — ABNORMAL LOW (ref >60)
Glucose, Bld: 104 mg/dL — ABNORMAL HIGH (ref 70–99)
Potassium: 4.5 mmol/L (ref 3.5–5.1)
Sodium: 135 mmol/L (ref 135–145)
Total Bilirubin: 0.7 mg/dL (ref 0.3–1.2)
Total Protein: 6.1 g/dL — ABNORMAL LOW (ref 6.5–8.1)

## 2022-03-25 LAB — CBC WITH DIFFERENTIAL/PLATELET
Abs Immature Granulocytes: 0.08 10*3/uL — ABNORMAL HIGH (ref 0.00–0.07)
Basophils Absolute: 0 10*3/uL (ref 0.0–0.1)
Basophils Relative: 1 %
Eosinophils Absolute: 0.9 10*3/uL — ABNORMAL HIGH (ref 0.0–0.5)
Eosinophils Relative: 11 %
HCT: 27.7 % — ABNORMAL LOW (ref 36.0–46.0)
Hemoglobin: 9 g/dL — ABNORMAL LOW (ref 12.0–15.0)
Immature Granulocytes: 1 %
Lymphocytes Relative: 21 %
Lymphs Abs: 1.7 10*3/uL (ref 0.7–4.0)
MCH: 29.3 pg (ref 26.0–34.0)
MCHC: 32.5 g/dL (ref 30.0–36.0)
MCV: 90.2 fL (ref 80.0–100.0)
Monocytes Absolute: 0.7 10*3/uL (ref 0.1–1.0)
Monocytes Relative: 9 %
Neutro Abs: 4.7 10*3/uL (ref 1.7–7.7)
Neutrophils Relative %: 57 %
Platelets: 327 10*3/uL (ref 150–400)
RBC: 3.07 MIL/uL — ABNORMAL LOW (ref 3.87–5.11)
RDW: 13.6 % (ref 11.5–15.5)
WBC: 8.1 10*3/uL (ref 4.0–10.5)
nRBC: 0 % (ref 0.0–0.2)

## 2022-03-25 MED ORDER — SODIUM CHLORIDE 0.9 % IV SOLN: INTRAVENOUS | Status: AC

## 2022-03-25 MED ORDER — SORBITOL 70 % SOLN
15.0000 mL | Freq: Once | Status: AC
  Administered 2022-03-25: 15 mL via ORAL

## 2022-03-25 NOTE — Evaluation (Signed)
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 b.) TTE 11/16/2021: EF >55%; mild AS (MPG 9 mmHg)   CAD (coronary artery disease)    a.) s/p 3v CABG 03/29/2000   Carotid artery stenosis    a.) s/p LEFT CEA 09/09/2003. b.) Carotid doppler 35/36/1443: 1-54% LICA, CTO RICA; subclavian stenosis   Cataracts, bilateral    Cervical spondylosis without myelopathy    Chronic diastolic CHF (congestive heart failure), NYHA class 3 (HCC)    a.) TTE 05/27/2016: EF >55%, mild LA enlargement, triv PR, mild MR, mod TR; G3DD. b.) TTE 12/12/2017: EF >55%, mild LVH, BAE, mild MR/PR, mod TR; RVSP 52.8 mmHg. c.) TTE 03/18/2020: EF >55%, BAD, AS (MPG 8.7 mmHg); triv MR, mild TR/PR. d.) TTE 11/16/2021: EF >55%, LVH, G1DD, triv MR, mild PR, mod TR; AS (MPG 9 mmHg); MS (MPG 5 mmHg)   Chronic kidney disease, stage III (moderate) (HCC)    Chronic narcotic use 06/24/2014   Chronic pain syndrome    GERD (gastroesophageal reflux disease)    History of 2019 novel coronavirus disease (COVID-19) 09/22/2021    Hyperlipidemia    Hypertension    Long term current use of antithrombotics/antiplatelets    a.) on daily DAPT therapy (ASA + clopidogrel)   Lumbar stenosis with neurogenic claudication    Mitral stenosis 11/16/2021   a.) TTE 11/16/2021: EF >55%; mod MS (MPG 5 mmHg)   Osteoarthritis of hip    Pulmonary hypertension ([place]) 12/12/2017   a.) TTE 12/12/2017: mild; RVSP 52.8 mmHg   PVD (peripheral vascular disease) (HCC)    Renal artery stenosis (HCC)    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   Past Surgical History:  Past Surgical History:  Procedure Laterality Date   ABDOMINAL HYSTERECTOMY  1976   CAROTID ARTERY ANGIOPLASTY Left    CAROTID ENDARTERECTOMY Left 09/09/2003   Procedure: CAROTID ENDARTERECTOMY; Location: [facility]; Surgeon: Maura Crandall, MD   COLONOSCOPY WITH PROPOFOL N/A 08/16/2017   Procedure: COLONOSCOPY WITH PROPOFOL;  Surgeon: [name], MD;  Location: [facility] ENDOSCOPY;  Service: Endoscopy;  Laterality: N/A;   CORONARY ANGIOPLASTY WITH STENT PLACEMENT  2000   CORONARY ARTERY BYPASS GRAFT N/A 03/29/2000   Procedure: 3v CORONARY ARTERY BYPASS GRAFT; Location: [facility]   CYSTOSCOPY WITH STENT PLACEMENT Bilateral    ENDARTERECTOMY FEMORAL Bilateral 03/17/2022   Procedure: ENDARTERECTOMY FEMORAL ( BILATERAL SFA STENT);  Surgeon: Katha Cabal, MD;  Location: [facility];  Service: Vascular;  Laterality: Bilateral;   ESOPHAGOGASTRODUODENOSCOPY (EGD) WITH PROPOFOL N/A 08/16/2017   Procedure: ESOPHAGOGASTRODUODENOSCOPY (EGD) WITH PROPOFOL;  Surgeon: [name], MD;  [facility];  Service: Endoscopy;  Laterality: N/A;   ESOPHAGOGASTRODUODENOSCOPY (EGD) WITH PROPOFOL N/A 06/29/2018   Procedure: ESOPHAGOGASTRODUODENOSCOPY (EGD) WITH PROPOFOL;  Surgeon:  Virgel Manifold, MD;  Location: [facility] ENDOSCOPY;  Service: Endoscopy;  Laterality: N/A;   INSERTION OF ILIAC STENT Left 03/17/2022   Procedure: INSERTION OF ILIAC STENT;  Surgeon: Katha Cabal, MD;  Location: [facility];  Service: Vascular;  Laterality: Left;   LAPAROSCOPIC CHOLECYSTECTOMY Left 10/26/1999   Procedure: LAPAROSCOPIC CHOLECYSTECTOMY; Location: [facility]; Surgeon: Rochel Brome, MD   LEFT HEART CATH AND CORONARY ANGIOGRAPHY N/A 11/18/2021   Procedure: LEFT HEART CATH AND CORONARY ANGIOGRAPHY;  Surgeon: Yolonda Kida, MD;  Location: [place] [facility];  Service: Cardiovascular;  Laterality: N/A;   LEFT HEART CATH AND CORS/GRAFTS ANGIOGRAPHY Left 11/19/2002   Procedure: LEFT HEART CATH AND CORS/GRAFTS ANGIOGRAPHY; Location: [facility]; Surgeon: Katrine Coho, MD   LEFT HEART CATH AND CORS/GRAFTS ANGIOGRAPHY Left 09/17/2003   Procedure: LEFT HEART CATH AND CORS/GRAFTS ANGIOGRAPHY; Location: [place]; Surgeon: Katrine Coho, MD   LOWER EXTREMITY ANGIOGRAPHY Right 01/06/2022   Procedure: Lower Extremity Angiography;  Surgeon: Katha Cabal, MD;  Location: [place] [facility];  Service: Cardiovascular;  Laterality: Right;   RENAL ARTERY ANGIOPLASTY Bilateral 12/2013   TOE AMPUTATION Right    small toe   TONSILLECTOMY AND ADENOIDECTOMY     TOTAL HIP ARTHROPLASTY Left 2005   TOTAL HIP ARTHROPLASTY Right 2015   UPPER EXTREMITY ANGIOGRAPHY Left 11/23/2021   Procedure: UPPER EXTREMITY ANGIOGRAPHY;  Surgeon: Algernon Huxley, MD;  Location: [place] [facility];  Service: Cardiovascular;  Laterality: Left;    Assessment & Plan Clinical Impression:  Sabrina [name] is an 81 year old right-handed female with history of obesity with BMI 30.53, chronic hip pain, chronic anemia, CAD/CABG x3 with LIMA-LAD, SVG-distal RCA and SVG-ramus intermedius 03/2000 with recent heart cath 11/18/2021 showing extensive native CAD and 100% occlusion SVG-RCA and 90%, distal SVG-OM with significant stenosis  of her left subclavian status post left subclavian stenting 11/23/2021 maintained on low-dose aspirin, diastolic congestive heart failure, carotid artery angioplasty 2004, CKD stage III, hypertension, hyperlipidemia, peripheral vascular disease with multiple interventions in the past.  Per chart review patient lives alone.  1 level home.  Independent ADLs and driving.  Family in area can provide assistance as needed.  Presented to [facility] 03/17/2022 for elective bilateral superior femoral artery and left iliac stenting with vascular surgery due to peripheral vascular disease and debilitating claudication with some mild rest pain.  Underwent left common femoral, profunda femoris, and superficial femoral artery endarterectomies as well as right common femoral profunda femoris and superficial femoral artery enterectomy's stent placement to left external iliac artery with 8 mm diameter by 4 cm length of life star stent 03/17/2022 per Dr. Leotis Pain.    Postoperative acute respiratory failure cardiac arrest requiring CPR x2 minutes.  To have markedly elevated troponin of 15,000.  She did require intubation for airway protection and extubated same day.  Echocardiogram with ejection fraction of 60 to 65% no wall motion abnormalities.  She is currently maintained on aspirin 81 mg daily and Plavix 75 mg daily for at least 12 months and plan to discuss outpatient cardiac catheterization.  Ranexa ongoing at 500 mg twice daily per cardiology services Dr. Lujean Amel.  Ongoing wound care bilateral groin dressings with wound vacs removed 03/22/2022.  She is tolerating a regular diet.  Acute on chronic anemia 8.3 and monitored she was transfused 1 unit packed red blood cells 5/27. Therapy evaluations completed due to patient decreased functional mobility was admitted for a comprehensive rehab program. Complains of lower extremity weakness. Patient transferred to CIR on 03/24/2022 .   Patient currently requires mod with mobility  secondary to muscle weakness, decreased cardiorespiratoy endurance, and decreased standing balance and pain in groin region with mobility .  Prior to hospitalization, patient was independent  with mobility and lived with Alone in a House home.  Home access is  Level entry.  Patient will benefit from skilled PT intervention to maximize safe functional mobility, minimize fall risk, and decrease caregiver burden for planned discharge home with intermittent assist.  Anticipate patient will benefit from follow up [facility] at discharge.  PT - End of Session Activity Tolerance: Tolerates 30+ min activity with multiple rests Endurance Deficit: Yes Endurance Deficit Description: frequent rest breaks during functional activity PT Assessment Rehab Potential (ACUTE/IP ONLY): Good PT Barriers to Discharge: Decreased caregiver support;Lack of/limited family support PT Patient demonstrates impairments in the following area(s): Endurance;Pain;Safety;Sensory;Skin Integrity PT Transfers Functional Problem(s): Bed Mobility;Bed to Chair;Car;Furniture;Floor PT Locomotion Functional Problem(s): Ambulation;Wheelchair Mobility;Stairs PT Plan PT Intensity: Minimum of 1-2 x/day ,45 to 90 minutes PT Frequency: 5 out of 7 days PT Duration Estimated Length of Stay: 7-10 days PT Treatment/Interventions: Ambulation/gait training;Balance/vestibular training;Community reintegration;Discharge planning;Disease management/prevention;DME/adaptive equipment instruction;Functional mobility training;Pain management;Patient/family education;Stair training;Therapeutic Activities;Therapeutic Exercise;UE/LE Strength taining/ROM;UE/LE Coordination activities PT Transfers Anticipated Outcome(s): mod I PT Locomotion Anticipated Outcome(s): mod I at ambulatory level with LRAD PT Recommendation Follow Up Recommendations: Home health PT Patient destination: Home Equipment Recommended: Rolling walker with 5" wheels Equipment Details: per pt report  she already owns RW   PT Evaluation Precautions/Restrictions Precautions Precautions: Fall Precaution Comments: rib fx from CPR; pain in LEs Restrictions Weight Bearing Restrictions: No Pain Interference Pain Interference Pain Effect on Sleep: 2. Occasionally Pain Interference with Therapy Activities: 3. Frequently Pain Interference with Day-to-Day Activities: 3. Frequently Home Living/Prior Functioning Home Living Living Arrangements: Alone Available Help at Discharge: Family;Available PRN/intermittently (family visiting from [place] for a short while, otherwise local family works during the day) Type of Home: House Home Access: Level entry Home Layout: One level  Lives With: Alone Prior Function Level of Independence: Independent with gait;Independent with transfers  Able to Take Stairs?: Yes Driving: Yes Vision/Perception  Perception Perception: Within Functional Limits Praxis Praxis: Intact  Cognition Overall Cognitive Status: Within Functional Limits for tasks assessed Arousal/Alertness: Awake/alert Orientation Level: Oriented X4 Year: 2023 Attention: Selective Selective Attention: Appears intact Memory: Appears intact Awareness: Appears intact Problem Solving: Appears intact Safety/Judgment: Appears intact Sensation Sensation Light Touch: Impaired Detail Light Touch Impaired Details: Impaired RLE;Impaired LLE (impaired in B thighs and R knee) Proprioception: Appears Intact Coordination Gross Motor Movements are Fluid and Coordinated: No Fine Motor Movements are Fluid and Coordinated: Yes Coordination and Movement Description: impaired 2/2 pain Heel Shin Test: not tested 2/2 pain and hip weakness Motor  Motor Motor: Abnormal postural alignment and control Motor - Skilled Clinical Observations: impaired 2/2 pain  Trunk/Postural Assessment  Cervical Assessment Cervical Assessment: Within Functional Limits Thoracic Assessment Thoracic Assessment: Within  Functional Limits Lumbar Assessment Lumbar Assessment: Within Functional Limits Postural Control Postural Control: Within Functional Limits  Balance Balance Balance Assessed: Yes Static Sitting Balance Static Sitting - Balance Support: No upper extremity supported;Feet supported Static  Sitting - Level of Assistance: 5: Stand by assistance Dynamic Sitting Balance Dynamic Sitting - Balance Support: No upper extremity supported;Feet supported;During functional activity Dynamic Sitting - Level of Assistance: 4: Min assist Static Standing Balance Static Standing - Balance Support: Bilateral upper extremity supported;During functional activity Static Standing - Level of Assistance: 4: Min assist Dynamic Standing Balance Dynamic Standing - Balance Support: Right upper extremity supported;During functional activity Dynamic Standing - Level of Assistance: 3: Mod assist Extremity Assessment   RLE Assessment RLE Assessment: Exceptions to [facility] General Strength Comments: impaired 2/2 pain, see below RLE Strength Right Hip Flexion: 3-/5 Right Knee Flexion: 4/5 Right Knee Extension: 4/5 Right Ankle Dorsiflexion: 4/5 LLE Assessment LLE Assessment: Exceptions to [facility] General Strength Comments: impaired 2/2 pain, see below LLE Strength Left Hip Flexion: 3/5 Left Knee Flexion: 4/5 Left Knee Extension: 4/5 Left Ankle Dorsiflexion: 4/5  Care Tool Care Tool Bed Mobility Roll left and right activity   Roll left and right assist level: Moderate Assistance - Patient 50 - 74%    Sit to lying activity   Sit to lying assist level: Moderate Assistance - Patient 50 - 74%    Lying to sitting on side of bed activity   Lying to sitting on side of bed assist level: the ability to move from lying on the back to sitting on the side of the bed with no back support.: Maximal Assistance - Patient 25 - 49%     Care Tool Transfers Sit to stand transfer   Sit to stand assist level: Moderate Assistance -  Patient 50 - 74%    Chair/bed transfer   Chair/bed transfer assist level: Moderate Assistance - Patient 50 - 74%     Toilet transfer   Assist Level: Moderate Assistance - Patient 50 - 74%    Car transfer   Car transfer assist level: Moderate Assistance - Patient 50 - 74%      Care Tool Locomotion Ambulation   Assist level: Moderate Assistance - Patient 50 - 74% Assistive device: Hand held assist Max distance: 120'  Walk 10 feet activity   Assist level: Moderate Assistance - Patient - 50 - 74% Assistive device: Hand held assist   Walk 50 feet with 2 turns activity   Assist level: Moderate Assistance - Patient - 50 - 74% Assistive device: Hand held assist  Walk 150 feet activity Walk 150 feet activity did not occur: Safety/medical concerns      Walk 10 feet on uneven surfaces activity Walk 10 feet on uneven surfaces activity did not occur: Safety/medical concerns      Stairs Stair activity did not occur: Safety/medical concerns        Walk up/down 1 step activity Walk up/down 1 step or curb (drop down) activity did not occur: Safety/medical concerns      Walk up/down 4 steps activity Walk up/down 4 steps activity did not occur: Safety/medical concerns      Walk up/down 12 steps activity Walk up/down 12 steps activity did not occur: Safety/medical concerns      Pick up small objects from floor Pick up small object from the floor (from standing position) activity did not occur: Safety/medical concerns      Wheelchair Is the patient using a wheelchair?: No          Wheel 50 feet with 2 turns activity      Wheel 150 feet activity        Refer to Care Plan for Long Term Goals  SHORT TERM GOAL WEEK 1 PT Short Term Goal 1 (Week 1): =LTG due to ELOS  Recommendations for other services: None   Skilled Therapeutic Intervention Evaluation completed (see details above and below) with education on PT POC and goals and individual treatment initiated with focus on  functional transfer and gait assessment, orientation to rehab unit and schedule, and setting pt up with appropriate equipment to be utilized during rehab stay. Pt received seated in bed, agreeable to PT session. No complaints of pain at rest, has onset of B groin pain with mobility that improves at rest. Utilized rest breaks and repositioning as needed for pain management. Supine to sit with max A for some LE management and trunk elevation. Sit to stand and transfers with mod A and HHA, no AD throughout session. Toilet transfer with mod HHA, some assist needed for clothing management. Ambulation x120 ft with no AD and mod HHA for balance before onset of fatigue. Pt exhibits antalgic gait pattern and reports pulling sensation in groin area as well as some aching L hip pain with mobility that improves at rest. Pt also reports a "burning" sensation that starts in her hips and goes down into BLE with mobility, also experienced this sensation prior to surgery. Car transfer with mod A needed for BLE management. Pt returned to bed with mod A needed for BLE management at end of session.  Mobility Bed Mobility Bed Mobility: Rolling Right;Rolling Left;Supine to Sit;Sit to Supine Rolling Right: Moderate Assistance - Patient 50-74% Rolling Left: Moderate Assistance - Patient 50-74% Supine to Sit: Maximal Assistance - Patient - Patient 25-49% Sit to Supine: Moderate Assistance - Patient 50-74% Transfers Transfers: Sit to Stand;Stand to Sit Sit to Stand: Moderate Assistance - Patient 50-74% Stand to Sit: Moderate Assistance - Patient 50-74% Stand Pivot Transfers: Moderate Assistance - Patient 50 - 74% Stand Pivot Transfer Details: Verbal cues for sequencing;Verbal cues for precautions/safety;Manual facilitation for weight shifting Transfer (Assistive device): None Locomotion  Gait Gait Distance (Feet): 120 Feet Assistive device: 1 person hand held assist Gait Gait Pattern: Impaired (antalgic) Gait velocity:  decreased Stairs / Additional Locomotion Stairs: No Wheelchair Mobility Wheelchair Mobility: No   Discharge Criteria: Patient will be discharged from PT if patient refuses treatment 3 consecutive times without medical reason, if treatment goals not met, if there is a change in medical status, if patient makes no progress towards goals or if patient is discharged from hospital.  The above assessment, treatment plan, treatment alternatives and goals were discussed and mutually agreed upon: by patient   Excell Seltzer, PT, DPT, CSRS 03/25/2022, 4:15 PM

## 2022-03-25 NOTE — Progress Notes (Signed)
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 b.) TTE 11/16/2021: EF >55%; mild AS (MPG 9 mmHg)   CAD (coronary artery disease)    a.) s/p 3v CABG 03/29/2000   Carotid artery stenosis    a.) s/p LEFT CEA 09/09/2003. b.) Carotid doppler 01/06/9457: 5-92% LICA, CTO RICA; subclavian stenosis   Cataracts, bilateral    Cervical spondylosis without myelopathy    Chronic diastolic CHF (congestive heart failure), NYHA class 3 (HCC)    a.) TTE 05/27/2016: EF >55%, mild LA enlargement, triv PR, mild MR, mod TR; G3DD. b.) TTE 12/12/2017: EF >55%, mild LVH, BAE, mild MR/PR, mod TR; RVSP 52.8 mmHg. c.) TTE 03/18/2020: EF >55%, BAD, AS (MPG 8.7 mmHg); triv MR, mild TR/PR. d.) TTE 11/16/2021: EF >55%, LVH, G1DD, triv MR, mild PR, mod TR; AS (MPG 9 mmHg); MS (MPG 5 mmHg)   Chronic kidney disease, stage III (moderate) (HCC)    Chronic narcotic use 06/24/2014   Chronic pain syndrome    GERD (gastroesophageal reflux disease)    History of 2019 novel coronavirus disease (COVID-19) 09/22/2021   Hyperlipidemia    Hypertension    Long term current use of antithrombotics/antiplatelets    a.) on daily DAPT therapy (ASA + clopidogrel)   Lumbar stenosis with neurogenic claudication    Mitral stenosis 11/16/2021   a.) TTE 11/16/2021: EF >55%; mod MS (MPG 5 mmHg)    Osteoarthritis of hip    Pulmonary hypertension ([place]) 12/12/2017   a.) TTE 12/12/2017: mild; RVSP 52.8 mmHg   PVD (peripheral vascular disease) (HCC)    Renal artery stenosis (HCC)    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   Past Surgical History:  Past Surgical History:  Procedure Laterality Date   ABDOMINAL HYSTERECTOMY  1976   CAROTID ARTERY ANGIOPLASTY Left    CAROTID ENDARTERECTOMY Left 09/09/2003   Procedure: CAROTID ENDARTERECTOMY; Location: [facility]; Surgeon: Maura Crandall, MD   COLONOSCOPY WITH PROPOFOL N/A 08/16/2017   Procedure: COLONOSCOPY WITH PROPOFOL;  Surgeon: [name], MD;  Location: [facility];  Service: Endoscopy;  Laterality: N/A;   CORONARY ANGIOPLASTY WITH STENT PLACEMENT  2000   CORONARY ARTERY BYPASS GRAFT N/A 03/29/2000   Procedure: 3v CORONARY ARTERY BYPASS GRAFT; Location: [facility]   CYSTOSCOPY WITH STENT PLACEMENT Bilateral    ENDARTERECTOMY FEMORAL Bilateral 03/17/2022   Procedure: ENDARTERECTOMY FEMORAL ( BILATERAL SFA STENT);  Surgeon: Katha Cabal, MD;  Location: [facility];  Service: Vascular;  Laterality: Bilateral;   ESOPHAGOGASTRODUODENOSCOPY (EGD) WITH PROPOFOL N/A 08/16/2017   Procedure: ESOPHAGOGASTRODUODENOSCOPY (EGD) WITH PROPOFOL;  Surgeon: [name], MD;  Location: [facility];  Service: Endoscopy;  Laterality:  N/A;   ESOPHAGOGASTRODUODENOSCOPY (EGD) WITH PROPOFOL N/A 06/29/2018   Procedure: ESOPHAGOGASTRODUODENOSCOPY (EGD) WITH PROPOFOL;  Surgeon: Virgel Manifold, MD;  Location: [facility];  Service: Endoscopy;  Laterality: N/A;   INSERTION OF ILIAC STENT Left 03/17/2022   Procedure: INSERTION OF ILIAC STENT;  Surgeon: Katha Cabal, MD;  Location: [facility];  Service: Vascular;  Laterality: Left;    LAPAROSCOPIC CHOLECYSTECTOMY Left 10/26/1999   Procedure: LAPAROSCOPIC CHOLECYSTECTOMY; Location: [facility]; Surgeon: Rochel Brome, MD   LEFT HEART CATH AND CORONARY ANGIOGRAPHY N/A 11/18/2021   Procedure: LEFT HEART CATH AND CORONARY ANGIOGRAPHY;  Surgeon: Yolonda Kida, MD;  Location: [facility];  Service: Cardiovascular;  Laterality: N/A;   LEFT HEART CATH AND CORS/GRAFTS ANGIOGRAPHY Left 11/19/2002   Procedure: LEFT HEART CATH AND CORS/GRAFTS ANGIOGRAPHY; Location: [facility]; Surgeon: Katrine Coho, MD   LEFT HEART CATH AND CORS/GRAFTS ANGIOGRAPHY Left 09/17/2003   Procedure: LEFT HEART CATH AND CORS/GRAFTS ANGIOGRAPHY; Location: [facility]; Surgeon: Katrine Coho, MD   LOWER EXTREMITY ANGIOGRAPHY Right 01/06/2022   Procedure: Lower Extremity Angiography;  Surgeon: Katha Cabal, MD;  Location: [place];  Service: Cardiovascular;  Laterality: Right;   RENAL ARTERY ANGIOPLASTY Bilateral 12/2013   TOE AMPUTATION Right    small toe   TONSILLECTOMY AND ADENOIDECTOMY     TOTAL HIP ARTHROPLASTY Left 2005   TOTAL HIP ARTHROPLASTY Right 2015   UPPER EXTREMITY ANGIOGRAPHY Left 11/23/2021   Procedure: UPPER EXTREMITY ANGIOGRAPHY;  Surgeon: Algernon Huxley, MD;  Location: [facility] [facility];  Service: Cardiovascular;  Laterality: Left;   Social History:  reports that she quit smoking about 23 years ago. Her smoking use included cigarettes. She has never used smokeless tobacco. She reports that she does not drink alcohol and does not use drugs.  Family / Support Systems Marital Status: Divorced How Long?: 2004/2005 Patient Roles: Parent Spouse/Significant Other: Divorced (married twice) Children: 3 adult children: Sabrina [name] (dtr; lives in [place]; has [place]); Sabrina [name] ( son; lives in [place]), and Sabrina [name] (son; lives in [place] and has Sabrina [name]). Other Supports: None reported Anticipated Caregiver: Pt reports she lives alone. Pt dtr Sabrina [name] reports she  is available to assist her mother  in anyway possible despite havign MS and living in [place]. Ability/Limitations of Caregiver: 24/7 care when dtr is here. PRN support when her dtr returns to [place]. Caregiver Availability: 24/7 Family Dynamics: Pt lives alone.  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 Education: high school grad Health Literacy - How often do you need to have someone help you when you read instructions, pamphlets, or other written material from your doctor or pharmacy?: Never Writes: Yes Employment Status: Retired Date Retired/Disabled/Unemployed: 2006 Public relations account executive Issues: Denies Guardian/Conservator: N/A   Abuse/Neglect Abuse/Neglect Assessment Can Be Completed: Yes Physical Abuse: Denies Verbal Abuse: Denies Sexual Abuse: Denies Exploitation of patient/patient's resources: Denies Self-Neglect: Denies  Patient response to: Social Isolation - How often do you feel lonely or isolated from those around you?: Never  Emotional Status Pt's affect, behavior and adjustment status: Pt in good spirits at time of visit. Recent Psychosocial Issues: Pt denies Psychiatric History: Pt reports a hx of anxiety in which she was taking medication 3-4 yrs ago. Substance Abuse History: Denies  Patient / Family Perceptions, Expectations & Goals Pt/Family understanding of illness & functional limitations: Pt and family have a general understanding of pt care  needs Premorbid pt/family roles/activities: Independent Anticipated changes in roles/activities/participation: Assistance with ADLs/IADLs Pt/family expectations/goals: Pt goal is to work on getting her "legs/hips to stop hurting...trouble lifting legs up when getting in the car, and getting out of the car."  [employer]: None Premorbid Home Care/DME Agencies:  None Transportation available at discharge: TBD Is the patient able to respond to transportation needs?: Yes In the past 12 months, has lack of transportation kept you from medical appointments or from getting medications?: No In the past 12 months, has lack of transportation kept you from meetings, work, or from getting things needed for daily living?: No Resource referrals recommended: Neuropsychology  Discharge Planning Living Arrangements: [place]: Children, Other relatives Type of Residence: Private residence Insurance Resources: [employer] Financial Resources: [place] Referred: No Living Expenses: Medical laboratory scientific officer Management: Patient Does the patient have any problems obtaining your medications?: No Home Management: Pt manages all homecare- cooking, cleaning, appointments, etc. Patient/Family Preliminary Plans: No changes Care Coordinator Barriers to Discharge: Decreased caregiver support, Lack of/limited family support Care Coordinator Anticipated Follow Up Needs: HH/OP  Clinical Impression SW met with pt in room to introduce self, explain role, and discuss discharge process. Pt is not a English as a second language teacher. No HCPOA. DME: RW, 3in1 BSC, and cane. Pt is very independent.  [place] spoke with pt dtr Sabrina [name]. Reports she is available to help with caring for her mother (she and her husband). States when she goes back to [place] she will only be gone for 2-3 days because she needs her MS medications, otherwise, she is more than willing to help. SW informed will f/u tomorrow with [facility] to help them plan accordingly.   Quaniyah Bugh A Patrece Tallie 03/25/2022, 2:29 PM

## 2022-03-25 NOTE — Plan of Care (Signed)
  Problem: RH Bed Mobility Goal: LTG Patient will perform bed mobility with assist (PT) Description: LTG: Patient will perform bed mobility with assistance, with/without cues (PT). Flowsheets (Taken 03/25/2022 1624) LTG: Pt will perform bed mobility with assistance level of: Independent with assistive device    Problem: RH Bed to Chair Transfers Goal: LTG Patient will perform bed/chair transfers w/assist (PT) Description: LTG: Patient will perform bed to chair transfers with assistance (PT). Flowsheets (Taken 03/25/2022 1624) LTG: Pt will perform Bed to Chair Transfers with assistance level: Independent with assistive device    Problem: RH Car Transfers Goal: LTG Patient will perform car transfers with assist (PT) Description: LTG: Patient will perform car transfers with assistance (PT). Flowsheets (Taken 03/25/2022 1624) LTG: Pt will perform car transfers with assist:: Independent with assistive device    Problem: RH Ambulation Goal: LTG Patient will ambulate in controlled environment (PT) Description: LTG: Patient will ambulate in a controlled environment, # of feet with assistance (PT). Flowsheets (Taken 03/25/2022 1624) LTG: Pt will ambulate in controlled environ  assist needed:: Independent with assistive device LTG: Ambulation distance in controlled environment: 150 ft with LRAD Goal: LTG Patient will ambulate in home environment (PT) Description: LTG: Patient will ambulate in home environment, # of feet with assistance (PT). Flowsheets (Taken 03/25/2022 1624) LTG: Pt will ambulate in home environ  assist needed:: Independent with assistive device LTG: Ambulation distance in home environment: 75 ft with LRAD

## 2022-03-25 NOTE — Progress Notes (Signed)
Physical Therapy Session Note  Patient Details  Name: Sabrina [name] MRN: [id number] Date of Birth: 1941/08/08  Today's Date: 03/25/2022 PT Individual Time: 1415-1530 PT Individual Time Calculation (min): 75 min   Short Term Goals: Week 1:  PT Short Term Goal 1 (Week 1): =LTG due to ELOS  Skilled Therapeutic Interventions/Progress Updates:    Pt received supine in bed, agreeable to PT session. No complaints of pain at rest, has onset of groin pain with mobility that improves at rest. Utilized rest breaks and repositioning as needed for pain management during session. Pt requesting to store her dentures in her bra during session. Encouraged pt to place dentures in designated pink box on her sink, pt prefers to keep dentures in her bra for safe-keeping. Supine to sit with mod A needed for trunk elevation in bed, also use of bedrails and HOB elevated. Sit to stand and transfers with RW and min A throughout session. Pt reports feeling dizzy upon initially getting up OOB this session. Seated BP 136/50 and symptoms resolve with seated rest break. Ambulation x 60 ft, x 70 ft with RW and CGA to min A for balance. Pt exhibits improved balance and increased independence with use of RW as compared to HHA from AM session. Pt reports ongoing "pulling" sensation in groin with gait. Standing alt L/R 3" step-taps with BUE support and CGA for balance, x 5 reps before pt reports urge to toilet. Toilet transfer with RW and min A. Pt is setup A for 3/3 toileting steps. Pt requests to return to bed at end of session due to fatigue. Once seated EOB pt unable to locate her dentures in her bra. Retraced steps taken during session and looked through trash cans, unable to locate pt's dentures. Charge nurse notified of missing dentures and to notify pt's family. Sit to supine mod A needed for BLE management. Pt left seated in bed with needs in reach, bed alarm in place.  Therapy Documentation Precautions:   Precautions Precautions: Fall Precaution Comments: rib fx from CPR; pain in LEs Restrictions Weight Bearing Restrictions: No     Therapy/Group: Individual Therapy   Excell Seltzer, PT, DPT, CSRS 03/25/2022, 3:58 PM

## 2022-03-25 NOTE — Evaluation (Signed)
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 b.) TTE 11/16/2021: EF >55%; mild AS (MPG 9 mmHg)   CAD (coronary artery disease)    a.) s/p 3v CABG 03/29/2000   Carotid artery stenosis    a.) s/p LEFT CEA 09/09/2003. b.) Carotid doppler 46/96/2952: 8-41% LICA, CTO RICA; subclavian stenosis   Cataracts, bilateral    Cervical spondylosis without myelopathy    Chronic diastolic CHF (congestive heart failure), NYHA class 3 (HCC)    a.) TTE 05/27/2016: EF >55%, mild LA enlargement, triv PR, mild MR, mod TR; G3DD. b.) TTE 12/12/2017: EF >55%, mild LVH, BAE, mild MR/PR, mod TR; RVSP 52.8 mmHg. c.) TTE 03/18/2020: EF >55%, BAD, AS (MPG 8.7 mmHg); triv MR, mild TR/PR. d.) TTE 11/16/2021: EF >55%, LVH, G1DD, triv MR, mild PR, mod TR; AS (MPG 9 mmHg); MS (MPG 5 mmHg)   Chronic kidney disease, stage III (moderate) (HCC)    Chronic narcotic use 06/24/2014   Chronic pain syndrome    GERD (gastroesophageal reflux disease)    History of 2019 novel coronavirus disease (COVID-19) 09/22/2021   Hyperlipidemia    Hypertension    Long term current use of  antithrombotics/antiplatelets    a.) on daily DAPT therapy (ASA + clopidogrel)   Lumbar stenosis with neurogenic claudication    Mitral stenosis 11/16/2021   a.) TTE 11/16/2021: EF >55%; mod MS (MPG 5 mmHg)   Osteoarthritis of hip    Pulmonary hypertension ([place]) 12/12/2017   a.) TTE 12/12/2017: mild; RVSP 52.8 mmHg   PVD (peripheral vascular disease) (HCC)    Renal artery stenosis (HCC)    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   Past Surgical History:  Past Surgical History:  Procedure Laterality Date   ABDOMINAL HYSTERECTOMY  1976   CAROTID ARTERY ANGIOPLASTY Left    CAROTID ENDARTERECTOMY Left 09/09/2003   Procedure: CAROTID ENDARTERECTOMY; Location: [facility]; Surgeon: Maura Crandall, MD   COLONOSCOPY WITH PROPOFOL N/A 08/16/2017   Procedure: COLONOSCOPY WITH PROPOFOL;  Surgeon: [name], MD;  Location: [facility];  Service: Endoscopy;  Laterality: N/A;   CORONARY ANGIOPLASTY WITH STENT PLACEMENT  2000   CORONARY ARTERY BYPASS GRAFT N/A 03/29/2000   Procedure: 3v CORONARY ARTERY BYPASS GRAFT; Location: [facility]   CYSTOSCOPY WITH STENT PLACEMENT Bilateral    ENDARTERECTOMY FEMORAL Bilateral 03/17/2022   Procedure: ENDARTERECTOMY FEMORAL ( BILATERAL SFA STENT);  Surgeon: Katha Cabal, MD;  Location: [facility];  Service: Vascular;  Laterality: Bilateral;   ESOPHAGOGASTRODUODENOSCOPY (EGD) WITH PROPOFOL N/A 08/16/2017   Procedure: ESOPHAGOGASTRODUODENOSCOPY (EGD) WITH PROPOFOL;  Surgeon: [name], MD;  Location: [facility];  Service: Endoscopy;  Laterality: N/A;   ESOPHAGOGASTRODUODENOSCOPY (EGD) WITH PROPOFOL N/A 06/29/2018   Procedure: ESOPHAGOGASTRODUODENOSCOPY (EGD) WITH PROPOFOL;  Surgeon: Virgel Manifold, MD;  Location: [facility];  Service:  Endoscopy;  Laterality: N/A;   INSERTION OF ILIAC STENT Left 03/17/2022   Procedure: INSERTION OF ILIAC STENT;  Surgeon: Katha Cabal, MD;  Location: [facility];  Service: Vascular;  Laterality: Left;   LAPAROSCOPIC CHOLECYSTECTOMY Left 10/26/1999   Procedure: LAPAROSCOPIC CHOLECYSTECTOMY; Location: [facility]; Surgeon: Rochel Brome, MD   LEFT HEART CATH AND CORONARY ANGIOGRAPHY N/A 11/18/2021   Procedure: LEFT HEART CATH AND CORONARY ANGIOGRAPHY;  Surgeon: Yolonda Kida, MD;  Location: [place] [facility];  Service: Cardiovascular;  Laterality: N/A;   LEFT HEART CATH AND CORS/GRAFTS ANGIOGRAPHY Left 11/19/2002   Procedure: LEFT HEART CATH AND CORS/GRAFTS ANGIOGRAPHY; Location: [place]; Surgeon: Katrine Coho, MD   LEFT HEART CATH AND CORS/GRAFTS ANGIOGRAPHY Left 09/17/2003   Procedure: LEFT HEART CATH AND CORS/GRAFTS ANGIOGRAPHY; Location: [place]; Surgeon: Katrine Coho, MD   LOWER EXTREMITY ANGIOGRAPHY Right 01/06/2022   Procedure: Lower Extremity Angiography;  Surgeon: Katha Cabal, MD;  Location: [place] [facility];  Service: Cardiovascular;  Laterality: Right;   RENAL ARTERY ANGIOPLASTY Bilateral 12/2013   TOE AMPUTATION Right    small toe   TONSILLECTOMY AND ADENOIDECTOMY     TOTAL HIP ARTHROPLASTY Left 2005   TOTAL HIP ARTHROPLASTY Right 2015   UPPER EXTREMITY ANGIOGRAPHY Left 11/23/2021   Procedure: UPPER EXTREMITY ANGIOGRAPHY;  Surgeon: Algernon Huxley, MD;  Location: [place] [facility];  Service: Cardiovascular;  Laterality: Left;    Assessment & Plan Clinical Impression: Patient is a 81 y.o. year old female right-handed female with history of obesity with BMI 30.53, chronic hip pain, chronic anemia, CAD/CABG x3 with LIMA-LAD, SVG-distal RCA and SVG-ramus intermedius 03/2000 with recent heart cath 11/18/2021 showing extensive native CAD and 100% occlusion SVG-RCA and 90%, distal SVG-OM with significant stenosis of her left subclavian status post left subclavian stenting  11/23/2021 maintained on low-dose aspirin, diastolic congestive heart failure, carotid artery angioplasty 2004, CKD stage III, hypertension, hyperlipidemia, peripheral vascular disease with multiple interventions in the past.  Per chart review patient lives alone.  1 level home.  Independent ADLs and driving.  Family in area can provide assistance as needed.  [facility] for elective bilateral superior femoral artery and left iliac stenting with vascular surgery due to peripheral vascular disease and debilitating claudication with some mild rest pain.  Underwent left common femoral, profunda femoris, and superficial femoral artery endarterectomies as well as right common femoral profunda femoris and superficial femoral artery enterectomy's stent placement to left external iliac artery with 8 mm diameter by 4 cm length of life star stent 03/17/2022 per Dr. Leotis Pain.    Postoperative acute respiratory failure cardiac arrest requiring CPR x2 minutes.  To have markedly elevated troponin of 15,000.  She did require intubation for airway protection and extubated same day.  Echocardiogram with ejection fraction of 60 to 65% no wall motion abnormalities.  She is currently maintained on aspirin 81 mg daily and Plavix 75 mg daily for at least 12 months and plan to discuss outpatient cardiac catheterization.  Ranexa ongoing at 500 mg twice daily per cardiology services Dr. Lujean Amel.  Ongoing wound care bilateral groin dressings with wound  vacs removed 03/22/2022.  She is tolerating a regular diet.  Acute on chronic anemia 8.3 and monitored she was transfused 1 unit packed red blood cells 5/27.  ..    Patient transferred to CIR on 03/24/2022 .    Patient currently requires min with basic self-care skills and function ambulation/mobility   secondary to muscle weakness and acute pain , decreased cardiorespiratoy endurance, and decreased sitting balance and decreased standing balance.  Prior to  hospitalization, patient could complete ADL with independent .  Patient will benefit from skilled intervention to decrease level of assist with basic self-care skills and increase independence with basic self-care skills prior to discharge home with care partner.  Anticipate patient will require  check ins for A with IADLs as she can direct  and follow up home health.  OT - End of Session Activity Tolerance: Tolerates 30+ min activity with multiple rests Endurance Deficit: Yes OT Assessment Rehab Potential (ACUTE ONLY): Good OT Patient demonstrates impairments in the following area(s): Balance;Cognition;Edema;Motor;Pain;Safety OT Basic ADL's Functional Problem(s): Grooming;Bathing;Dressing;Toileting OT Transfers Functional Problem(s): Toilet;Tub/Shower OT Additional Impairment(s): Fuctional Use of Upper Extremity OT Plan OT Intensity: Minimum of 1-2 x/day, 45 to 90 minutes OT Frequency: 5 out of 7 days OT Duration/Estimated Length of Stay: ~7-9 days OT Treatment/Interventions: Balance/vestibular training;Disease mangement/prevention;Neuromuscular re-education;Self Care/advanced ADL retraining;Therapeutic Exercise;Cognitive remediation/compensation;DME/adaptive equipment instruction;Pain management;Skin care/wound managment;UE/LE Strength taining/ROM;Community reintegration;Functional electrical stimulation;Patient/family education;UE/LE Coordination activities;Discharge planning;Functional mobility training;Psychosocial support;Therapeutic Activities OT Self Feeding Anticipated Outcome(s): n/a OT Basic Self-Care Anticipated Outcome(s): mod I OT Toileting Anticipated Outcome(s): mod I OT Bathroom Transfers Anticipated Outcome(s): mod I OT Recommendation Patient destination: Home Follow Up Recommendations: Home health OT Equipment Recommended: To be determined   OT Evaluation Precautions/Restrictions  Precautions Precautions: Fall Precaution Comments: rib fx from CPR; pain in  LEs Restrictions Weight Bearing Restrictions: No General Chart Reviewed: Yes Family/Caregiver Present: No Vital Signs  Pain Pain Assessment Pain Score: 0-No pain Home Living/Prior Functioning Home Living Family/patient expects to be discharged to:: Private residence Living Arrangements: Alone Available Help at Discharge: Family, Available 24 hours/day Type of Home: House Home Access: Level entry Home Layout: One level Bathroom Shower/Tub: Tub/shower unit, Multimedia programmer: Programmer, systems: Yes  Lives With: Alone IADL History Homemaking Responsibilities: Yes Meal Prep Responsibility: Primary Laundry Responsibility: Primary Cleaning Responsibility: Primary Bill Paying/Finance Responsibility: Primary Shopping Responsibility: Primary Current License: Yes Vision Baseline Vision/History: 0 No visual deficits Ability to See in Adequate Light: 0 Adequate Patient Visual Report: No change from baseline Vision Assessment?: Yes;No apparent visual deficits Perception  Perception: Within Functional Limits Praxis Praxis: Intact Cognition Cognition Overall Cognitive Status: Within Functional Limits for tasks assessed Arousal/Alertness: Awake/alert Orientation Level: Place;Person;Situation Place: Oriented Situation: Oriented Memory: Appears intact Attention: Selective Selective Attention: Appears intact Awareness: Appears intact Problem Solving: Appears intact Safety/Judgment: Appears intact Brief Interview for Mental Status (BIMS) Repetition of Three Words (First Attempt): 3 Temporal Orientation: Year: Correct Temporal Orientation: Month: Accurate within 5 days Temporal Orientation: Day: Correct Recall: "Sock": Yes, no cue required Recall: "Blue": Yes, no cue required Recall: "Bed": Yes, no cue required BIMS Summary Score: 15 Sensation Sensation Light Touch: Appears Intact Hot/Cold: Appears Intact Proprioception: Appears  Intact Additional Comments: reports numbness in inner thigh of bilateral LEs Coordination Gross Motor Movements are Fluid and Coordinated: No Fine Motor Movements are Fluid and Coordinated: Yes Coordination and Movement Description: limited by pain Motor  Motor Motor: Within Functional Limits Motor - Skilled Clinical Observations: generalized weakness and acute pain  Trunk/Postural Assessment  Cervical Assessment Cervical Assessment: Within Functional Limits Thoracic Assessment Thoracic Assessment: Within Functional Limits Lumbar Assessment Lumbar Assessment: Within Functional Limits Postural Control Postural Control: Within Functional Limits  Balance Balance Balance Assessed: Yes Static Sitting Balance Static Sitting - Balance Support: Feet supported Static Sitting - Level of Assistance: 4: Min assist Dynamic Sitting Balance Dynamic Sitting - Balance Support: During functional activity Dynamic Sitting - Level of Assistance: 4: Min assist Static Standing Balance Static Standing - Balance Support: During functional activity Static Standing - Level of Assistance: 4: Min assist Dynamic Standing Balance Dynamic Standing - Balance Support: During functional activity Dynamic Standing - Level of Assistance: 4: Min assist Extremity/Trunk Assessment RUE Assessment RUE Assessment: Within Functional Limits LUE Assessment LUE Assessment: Within Functional Limits  Care Tool Care Tool Self Care Eating   Eating Assist Level: Independent    Oral Care    Oral Care Assist Level: Set up assist    Bathing   Body parts bathed by patient: Right arm;Left arm;Chest;Abdomen;Front perineal area;Buttocks;Right upper leg;Left upper leg;Face Body parts bathed by helper: Right lower leg;Left lower leg   Assist Level: Minimal Assistance - Patient > 75%    Upper Body Dressing(including orthotics)   What is the patient wearing?: Hospital gown only   Assist Level: Minimal Assistance - Patient  > 75%    Lower Body Dressing (excluding footwear)   What is the patient wearing?: Underwear/pull up;Pants Assist for lower body dressing: Total Assistance - Patient < 25%    Putting on/Taking off footwear   What is the patient wearing?: Non-skid slipper socks Assist for footwear: Total Assistance - Patient < 25%       Care Tool Toileting Toileting activity   Assist for toileting: Contact Guard/Touching assist     Care Tool Bed Mobility Roll left and right activity        Sit to lying activity   Sit to lying assist level: Maximal Assistance - Patient 25 - 49%    Lying to sitting on side of bed activity         Care Tool Transfers Sit to stand transfer   Sit to stand assist level: Minimal Assistance - Patient > 75%    Chair/bed transfer   Chair/bed transfer assist level: Minimal Assistance - Patient > 75%     Toilet transfer   Assist Level: Minimal Assistance - Patient > 75%     Care Tool Cognition  Expression of Ideas and Wants Expression of Ideas and Wants: 4. Without difficulty (complex and basic) - expresses complex messages without difficulty and with speech that is clear and easy to understand  Understanding Verbal and Non-Verbal Content Understanding Verbal and Non-Verbal Content: 4. Understands (complex and basic) - clear comprehension without cues or repetitions   Memory/Recall Ability     Refer to Care Plan for Long Term Goals  SHORT TERM GOAL WEEK 1 OT Short Term Goal 1 (Week 1): STG=LTG  Recommendations for other services: None    Skilled Therapeutic Intervention  1:1 Ot eval initiated with OT goals, purpose and role discussed with pt. Pt with ongoing pain in ribs from fx from CPR - pt patient. Pt also with pain in right Le> left Le. Pt does require more time for mobility/ ADL due to ongoing pain. Min A to come to EOB and then into standing with HHA. Pt was able to ambulate to the bathroom /toilet with min A. Pt did required A to doff underwear and wash  feet due to pain  in Arial and chest. Pt showered sit to stand with contact guard. Pt ambulated back to bed with required max A to get bilateral Les back into bed for OT and RN to change groin bandages. Total A to don mesh underwear and pants due to pain. Pt left in bed resting with RN present. Pt didnt eat breakfast; encouragement to try to eat something with taking her meds. Safety measures in place.   ADL ADL Eating: Independent Grooming: Setup Upper Body Bathing: Supervision/safety Where Assessed-Upper Body Bathing: Shower Lower Body Bathing: Contact guard Where Assessed-Lower Body Bathing: Shower Upper Body Dressing: Contact guard Where Assessed-Upper Body Dressing: Chair Lower Body Dressing: Maximal assistance Where Assessed-Lower Body Dressing: Chair Toileting: Moderate assistance Where Assessed-Toileting: Glass blower/designer: Psychiatric nurse Method: Counselling psychologist: Energy manager: Electronics engineer: Grab bars Mobility  Bed Mobility Bed Mobility: Supine to Sit Supine to Sit: Minimal Assistance - Patient > 75% Transfers Sit to Stand: Minimal Assistance - Patient > 75% Stand to Sit: Minimal Assistance - Patient > 75%   Discharge Criteria: Patient will be discharged from OT if patient refuses treatment 3 consecutive times without medical reason, if treatment goals not met, if there is a change in medical status, if patient makes no progress towards goals or if patient is discharged from hospital.  The above assessment, treatment plan, treatment alternatives and goals were discussed and mutually agreed upon: by patient  Nicoletta Ba 03/25/2022, 11:32 AM

## 2022-03-25 NOTE — Progress Notes (Signed)
Inpatient Rehabilitation  Patient information reviewed and entered into eRehab system by Jacy Brocker M. Syd Newsome, M.A., CCC/SLP, PPS Coordinator.  Information including medical coding, functional ability and quality indicators will be reviewed and updated through discharge.    

## 2022-03-25 NOTE — Progress Notes (Signed)
PROGRESS NOTE   Subjective/Complaints:  Pt reports LBM 2 days ago- "always constipated, even at home"- usually goes 2x/week at home.   Hurting under L breast from rib fx. Otherwise, no pain this AM.   Peeing OK; says she's drinking.    ROS:  Pt denies SOB, abd pain, CP, N/V/C/D, and vision changes    Objective:   No results found. Recent Labs    03/23/22 0602 03/25/22 0524  WBC 7.2 8.1  HGB 8.3* 9.0*  HCT 25.6* 27.7*  PLT 235 327   Recent Labs    03/23/22 0602 03/24/22 0503 03/25/22 0524  NA 138  --  135  K 4.4  --  4.5  CL 105  --  101  CO2 26  --  27  GLUCOSE 102*  --  104*  BUN 23  --  25*  CREATININE 1.43* 1.59* 1.89*  CALCIUM 8.9  --  9.4    Intake/Output Summary (Last 24 hours) at 03/25/2022 0839 Last data filed at 03/24/2022 1914 Gross per 24 hour  Intake 75 ml  Output --  Net 75 ml        Physical Exam: Vital Signs Blood pressure (!) 147/42, pulse 67, temperature 98.4 F (36.9 C), temperature source Oral, resp. rate 16, SpO2 99 %.   General: awake, alert, appropriate, supine in bed; appears a little frail;  NAD HENT: conjugate gaze; oropharynx moist CV: regular rate; no JVD Pulmonary: CTA B/L; no W/R/R- good air movement- no reduction at bases this AM GI: soft, NT, ND, (+)BS- flat, but hypoactive BS Psychiatric: appropriate- a little frustrated being woken up Neurological: alert Skin:    Comments: Bilateral femoral endarterectomy sites with some oozing of right groin. Dressed this AM- C/D/I Neurological:     Comments: Patient is alert.  Makes eye contact with examiner.  Follows simple commands.  Provides name and age but could not recall full hospital course. Significant lower extremity weakness R>L. 3/5 strength left lower extremity, 2/5 right lower extremity  Assessment/Plan: 1. Functional deficits which require 3+ hours per day of interdisciplinary therapy in a comprehensive  inpatient rehab setting. Physiatrist is providing close team supervision and 24 hour management of active medical problems listed below. Physiatrist and rehab team continue to assess barriers to discharge/monitor patient progress toward functional and medical goals  Care Tool:  Bathing              Bathing assist       Upper Body Dressing/Undressing Upper body dressing        Upper body assist      Lower Body Dressing/Undressing Lower body dressing            Lower body assist       Toileting Toileting    Toileting assist Assist for toileting: Contact Guard/Touching assist     Transfers Chair/bed transfer  Transfers assist           Locomotion Ambulation   Ambulation assist              Walk 10 feet activity   Assist           Walk 50 feet activity  Assist           Walk 150 feet activity   Assist           Walk 10 feet on uneven surface  activity   Assist           Wheelchair     Assist               Wheelchair 50 feet with 2 turns activity    Assist            Wheelchair 150 feet activity     Assist          Blood pressure (!) 147/42, pulse 67, temperature 98.4 F (36.9 C), temperature source Oral, resp. rate 16, SpO2 99 %.  Medical Problem List and Plan: 1. Functional deficits secondary to debility secondary to peripheral vascular disease with debilitating claudication.  Status post bilateral endarterectomies with stent placement 03/17/2022 per Dr. Leotis Pain             -patient may shower             -ELOS/Goals: 5-7 days S             -First day of evaluations- start/con't CIR- PT and OT 2.  Antithrombotics: -DVT/anticoagulation:  Mechanical: Antiembolism stockings, thigh (TED hose) Bilateral lower extremities             -antiplatelet therapy: Aspirin 81 mg daily and Plavix 75 mg daily 3. Pain Management:/Chronic hip pain L rib fx from CPR: continue oxycodone as  needed  6/1- having Rib pain from fracture under L breast- suggest asking for pain meds prior to therapy- let nurse know 4. Mood: Provided emotional support             -antipsychotic agents: N/A 5. Neuropsych: This patient is capable of making decisions on her own behalf. 6. Skin/Wound Care: Routine skin checks 7. Fluids/Electrolytes/Nutrition: Routine in and outs with follow-up chemistries 8.  Postoperative cardiopulmonary arrest/non-STEMI with history of CAD/CABG.  Patient did receive CPR x2 minutes.  Follow-up cardiology services.  Continue Ranexa 500 mg twice daily.  Plan outpatient cardiac catheterization 9.  Acute on chronic anemia.  Continue iron supplement.  Follow-up CBC  6/1- Up slightly, however is also having renal changes, so could be form that.  10.  CKD stage III.  Baseline creatinine 1.42-1.56.  Follow-up chemistries  6/1- see #15 11.  Diastolic congestive heart failure.  Monitor for any signs of fluid overload 6/1- will order daily weights 12.  Hypertension.  Aldactone 25 mg daily, Avapro 300 mg daily, labetalol 300 mg twice daily, HCTZ 12.5 mg daily.  Monitor with increased mobility 13.  Hyperlipidemia.  Zetia 14.  Obesity.  BMI 30.53.  Dietary follow-up  15. Acute renal insuffiencey in setting of DCHF and CKD3-  6/1- Cr up to 1.89 from 1.50- BUN 25- will give 1 L IVFs 50cc/hour so don't overload and check daily weights and monitor closely for SOB.  16. Constipation-   6/1- last BM 2 days ago- wants milk of mg- however with renal issues, will do sorbitol- will order after therapy.    I spent a total of  39  minutes on total care today- >50% coordination of care- due to d/w team as well as PA since renal issues in setting of diastolic CHF- also spoke to nursing about weights.   LOS: 1 days A FACE TO FACE EVALUATION WAS PERFORMED  Sabrina [name] 03/25/2022, 8:39 AM

## 2022-03-25 NOTE — Plan of Care (Signed)
  Problem: RH Balance Goal: LTG Patient will maintain dynamic standing with ADLs (OT) Description: LTG:  Patient will maintain dynamic standing balance with assist during activities of daily living (OT)  Flowsheets (Taken 03/25/2022 1405) LTG: Pt will maintain dynamic standing balance during ADLs with: Independent with assistive device   Problem: Sit to Stand Goal: LTG:  Patient will perform sit to stand in prep for activites of daily living with assistance level (OT) Description: LTG:  Patient will perform sit to stand in prep for activites of daily living with assistance level (OT) Flowsheets (Taken 03/25/2022 1405) LTG: PT will perform sit to stand in prep for activites of daily living with assistance level: Independent with assistive device   Problem: RH Bathing Goal: LTG Patient will bathe all body parts with assist levels (OT) Description: LTG: Patient will bathe all body parts with assist levels (OT) Flowsheets (Taken 03/25/2022 1405) LTG: Pt will perform bathing with assistance level/cueing: Independent with assistive device    Problem: RH Dressing Goal: LTG Patient will perform upper body dressing (OT) Description: LTG Patient will perform upper body dressing with assist, with/without cues (OT). Flowsheets (Taken 03/25/2022 1405) LTG: Pt will perform upper body dressing with assistance level of: Independent Goal: LTG Patient will perform lower body dressing w/assist (OT) Description: LTG: Patient will perform lower body dressing with assist, with/without cues in positioning using equipment (OT) Flowsheets (Taken 03/25/2022 1405) LTG: Pt will perform lower body dressing with assistance level of: Independent with assistive device   Problem: RH Toileting Goal: LTG Patient will perform toileting task (3/3 steps) with assistance level (OT) Description: LTG: Patient will perform toileting task (3/3 steps) with assistance level (OT)  Flowsheets (Taken 03/25/2022 1405) LTG: Pt will perform  toileting task (3/3 steps) with assistance level: Independent with assistive device   Problem: RH Simple Meal Prep Goal: LTG Patient will perform simple meal prep w/assist (OT) Description: LTG: Patient will perform simple meal prep with assistance, with/without cues (OT). Flowsheets (Taken 03/25/2022 1405) LTG: Pt will perform simple meal prep with assistance level of: Independent with assistive device   Problem: RH Toilet Transfers Goal: LTG Patient will perform toilet transfers w/assist (OT) Description: LTG: Patient will perform toilet transfers with assist, with/without cues using equipment (OT) Flowsheets (Taken 03/25/2022 1405) LTG: Pt will perform toilet transfers with assistance level of: Independent with assistive device   Problem: RH Tub/Shower Transfers Goal: LTG Patient will perform tub/shower transfers w/assist (OT) Description: LTG: Patient will perform tub/shower transfers with assist, with/without cues using equipment (OT) Flowsheets (Taken 03/25/2022 1405) LTG: Pt will perform tub/shower stall transfers with assistance level of: Independent with assistive device

## 2022-03-26 DIAGNOSIS — R5381 Other malaise: Secondary | ICD-10-CM | POA: Diagnosis not present

## 2022-03-26 LAB — BASIC METABOLIC PANEL
Anion gap: 5 (ref 5–15)
BUN: 24 mg/dL — ABNORMAL HIGH (ref 8–23)
CO2: 26 mmol/L (ref 22–32)
Calcium: 9.1 mg/dL (ref 8.9–10.3)
Chloride: 103 mmol/L (ref 98–111)
Creatinine, Ser: 1.77 mg/dL — ABNORMAL HIGH (ref 0.44–1.00)
GFR, Estimated: 29 mL/min — ABNORMAL LOW (ref >60)
Glucose, Bld: 105 mg/dL — ABNORMAL HIGH (ref 70–99)
Potassium: 4.4 mmol/L (ref 3.5–5.1)
Sodium: 134 mmol/L — ABNORMAL LOW (ref 135–145)

## 2022-03-26 LAB — GLUCOSE, CAPILLARY: Glucose-Capillary: 102 mg/dL — ABNORMAL HIGH (ref 70–99)

## 2022-03-26 NOTE — Progress Notes (Signed)
Occupational Therapy Session Note  Patient Details  Name: Sabrina [name] MRN: [id number] Date of Birth: August 21, 1941  Today's Date: 03/26/2022 OT Individual Time: 1400-1430 OT Individual Time Calculation (min): 30 min    Short Term Goals: Week 1:  OT Short Term Goal 1 (Week 1): STG=LTG  Skilled Therapeutic Interventions/Progress Updates:    Pt resting in recliner upon arrival. Pt requested to use toilet. Amb with RW to bathroom with CGA. Toileting with supervision. Pt stood at sink to wash hands before transferring to w/c. Risk analyst education and simulated amb in kitchen to manage tasks at home. Pt returned to room and remained in w/c awaiting PT.  Therapy Documentation Precautions:  Precautions Precautions: Fall Precaution Comments: rib fx from CPR; pain in LEs Restrictions Weight Bearing Restrictions: No  Pain:  Pt denies pain at rest; increased discomfort in groin with activity and reported a burning session hips to below knees x 2; PA notifed   Therapy/Group: Individual Therapy  Leroy Libman 03/26/2022, 2:46 PM

## 2022-03-26 NOTE — Progress Notes (Signed)
Occupational Therapy Session Note  Patient Details  Name: Sabrina [name] MRN: [id number] Date of Birth: 18-Dec-1940  Today's Date: 03/26/2022 OT Individual Time: 3612-2449 OT Individual Time Calculation (min): 70 min    Short Term Goals: Week 1:  OT Short Term Goal 1 (Week 1): STG=LTG  Skilled Therapeutic Interventions/Progress Updates:    Pt resting in bed upon arrival and agreeable to getting OOB for therapy. Supine>sit EOB with min A using hospital bed functions. Sitting balance with supervision. Pt required assistance threading LLE into pants but was able to pull over hips when standing without assistance. Pt amb with RW to w/c for transport to ADL apt and tub room. Furniture transfers with min A. Pt currently sleeps in a bed that requires her to push up on bed frame to scoot back onto bed before laying down. Sit>supine with CGA and pushing up on step at bedside. Supine>sit with min A. Pt states she typically pulled up on side of her mattress. Pt has tub and walk-in shower at home. Pt currently has a tub seat. Tub has sliding glass doors that she doesn't wan't to remove. Shower will not accommodate a shower seat. Pt returned to room and amb with RW to recliner. All amb with RW at [facility]. Will address kitchen safety in later session. Pt remained in recliner with all needs within reach.    Therapy Documentation Precautions:  Precautions Precautions: Fall Precaution Comments: rib fx from CPR; pain in LEs Restrictions Weight Bearing Restrictions: No  Pain:  Pt reports increased pain in BLE/groin R>L with activity; no pain at rest   Therapy/Group: Individual Therapy  Leroy Libman 03/26/2022, 11:59 AM

## 2022-03-26 NOTE — IPOC Note (Signed)
Overall Plan of Care [facility]) Patient Details Name: Sabrina [name] SHEARON CLONCH MRN: [id number] DOB: 02-05-1941  Admitting Diagnosis: Debility  Hospital Problems: Principal Problem:   Debility     Functional Problem List: Nursing Edema, Endurance, Pain, Safety, Skin Integrity, Medication Management  PT Endurance, Pain, Safety, Sensory, Skin Integrity  OT Balance, Cognition, Edema, Motor, Pain, Safety  SLP    TR         Basic ADL's: OT Grooming, Bathing, Dressing, Toileting     Advanced  ADL's: OT       Transfers: PT Bed Mobility, Bed to Chair, Car, [employer], Floor  OT Toilet, Metallurgist: PT Ambulation, Emergency planning/management officer, Stairs     Additional Impairments: OT Fuctional Use of Upper Extremity  SLP        TR      Anticipated Outcomes Item Anticipated Outcome  Self Feeding n/a  Swallowing      Basic self-care  mod I  Toileting  mod I   Bathroom Transfers mod I  Bowel/Bladder  n/a  Transfers  mod I  Locomotion  mod I at ambulatory level with LRAD  Communication     Cognition     Pain  < 3  Safety/Judgment  supervision   Therapy Plan: PT Intensity: Minimum of 1-2 x/day ,45 to 90 minutes PT Frequency: 5 out of 7 days PT Duration Estimated Length of Stay: 7-10 days OT Intensity: Minimum of 1-2 x/day, 45 to 90 minutes OT Frequency: 5 out of 7 days OT Duration/Estimated Length of Stay: ~7-9 days     Team Interventions: Nursing Interventions Patient/Family Education, Disease Management/Prevention, Pain Management, Medication Management, Skin Care/Wound Management, Discharge Planning  PT interventions Ambulation/gait training, Training and development officer, Community reintegration, Discharge planning, Disease management/prevention, DME/adaptive equipment instruction, Functional mobility training, Pain management, Patient/family education, Stair training, Therapeutic Activities, Therapeutic Exercise, UE/LE Strength taining/ROM, UE/LE Coordination  activities  OT Interventions Balance/vestibular training, Disease mangement/prevention, Neuromuscular re-education, Self Care/advanced ADL retraining, Therapeutic Exercise, Cognitive remediation/compensation, DME/adaptive equipment instruction, Pain management, Skin care/wound managment, UE/LE Strength taining/ROM, Community reintegration, Technical sales engineer stimulation, Patient/family education, UE/LE Coordination activities, Discharge planning, Functional mobility training, Psychosocial support, Therapeutic Activities  SLP Interventions    TR Interventions    SW/CM Interventions Discharge Planning, Psychosocial Support, Patient/Family Education   Barriers to Discharge MD  Medical stability, Home enviroment access/loayout, Wound care, Lack of/limited family support, Weight, and Weight bearing restrictions  Nursing Decreased caregiver support, Home environment access/layout, Wound Care, Lack of/limited family support, Weight 1 level, level entry. Lives alone. Daughter visiting from [place].  PT Decreased caregiver support, Lack of/limited family support    OT      SLP      SW Decreased caregiver support, Lack of/limited family support     Team Discharge Planning: Destination: PT-Home ,OT- Home , SLP-  Projected Follow-up: PT-Home health PT, OT-  Home health OT, SLP-  Projected Equipment Needs: PT-Rolling walker with 5" wheels, OT- To be determined, SLP-  Equipment Details: PT-per pt report she already owns RW, OT-  Patient/family involved in discharge planning: PT- Patient,  OT-Patient, SLP-   MD ELOS: 7-10 days Medical Rehab Prognosis:  Good Assessment: The patient has been admitted for CIR therapies with the diagnosis of Debility with B/L groin wounds due to vascular surgery. The team will be addressing functional mobility, strength, stamina, balance, safety, adaptive techniques and equipment, self-care, bowel and bladder mgt, patient and caregiver education, wound care. Goals have  been set at mod I. Anticipated  discharge destination is home.        See Team Conference Notes for weekly updates to the plan of care

## 2022-03-26 NOTE — Progress Notes (Signed)
Physical Therapy Session Note  Patient Details  Name: Sabrina [name] MRN: [id number] Date of Birth: May 08, 1941  Today's Date: 03/26/2022 PT Individual Time: 0805-0900 and 1500-1530 PT Individual Time Calculation (min): 55 min and 30 min  Short Term Goals: Week 1:  PT Short Term Goal 1 (Week 1): =LTG due to ELOS  Skilled Therapeutic Interventions/Progress Updates: Tx1: Pt presented in bed agreeable to therapy. Pt performed supine to sit with CGA and use of bed features. PTA threaded pants for time management and pt was able to stand with supervision and increased time to pull pants up over hips. Nsg arrived to disconnect IV and pt was able to don housecoat with set up. Performed ambulatory transfer to w/c with RW and CGA. Pt transported to day room for energy conservation and participated in ambulation for endurance and conditioning. Pt was able to ambulate ~125f with RW and CGA fading to close supervision. At high/low mat performed Sit to stand x 8 for BLE strengthening. Pt required increased time between bouts due to increased pain in groin but resolved with rest. Pt then ambulated to high/low table with HHA and performed hip abd/add and hamstring curls 2 x 10. Pt did require seated rests between bouts due to fatigue and increased groin pain. After hamstring curls pt indicated felt "woozy" pt transported back to room and vitals checked (WNL: BP 122/50, SpO2 99% HR 98). Pt left in w/c at end of session with call bell within reach and current needs met.   Tx2: Pt presented in w/c agreeable to therapy. Pt denies pain at rest some discomfort with mobility but resolved with rest. Pt transported to rehab gym for energy conservation and time management. Pt participated in x 2 rounds of horseshoes for extended standing tolerance and reaching activity. Pt was able to stand and participate without AD and overall CGA. Pt did require seated rest between bouts for recovery. Pt explained that sometimes after  extended activity her legs "get hot all the way to my toes" but then resolved quickly. Pt then ambulated 163 ft with RW to build endurance. Pt transported remaining distance back to room and performed ambulatory transfer to bed. Pt required minA sit to supine due to pain at ribs and groin. Pt was able to reposition to comfort and left with bed alarm on, call bell within reach and needs met.      Therapy Documentation Precautions:  Precautions Precautions: Fall Precaution Comments: rib fx from CPR; pain in LEs Restrictions Weight Bearing Restrictions: No General:   Vital Signs: Therapy Vitals Temp: 98.2 F (36.8 C) Temp Source: Oral Pulse Rate: 73 Resp: 19 BP: (!) 144/52 Patient Position (if appropriate): Sitting Oxygen Therapy SpO2: 100 % O2 Device: Room Air Pain:   Mobility:   Locomotion :    Trunk/Postural Assessment :    Balance:   Exercises:   Other Treatments:      Therapy/Group: Individual Therapy  Catalea Labrecque 03/26/2022, 3:47 PM

## 2022-03-26 NOTE — Care Management (Signed)
Inpatient Rehabilitation Center Individual Statement of Services  Patient Name:  Sabrina [name]  Date:  03/26/2022  Welcome to the [facility].  Our goal is to provide you with an individualized program based on your diagnosis and situation, designed to meet your specific needs.  With this comprehensive rehabilitation program, you will be expected to participate in at least 3 hours of rehabilitation therapies Monday-Friday, with modified therapy programming on the weekends.  Your rehabilitation program will include the following services:  Physical Therapy (PT), Occupational Therapy (OT), 24 hour per day rehabilitation nursing, Therapeutic Recreaction (TR), Psychology, Neuropsychology, Care Coordinator, Rehabilitation Medicine, [place], and Other  Weekly team conferences will be held on Tuesdays to discuss your progress.  Your Inpatient Rehabilitation Care Coordinator will talk with you frequently to get your input and to update you on team discussions.  Team conferences with you and your family in attendance may also be held.  Expected length of stay: 7-10 days    Overall anticipated outcome: Independent with Assistive Device  Depending on your progress and recovery, your program may change. Your Inpatient Rehabilitation Care Coordinator will coordinate services and will keep you informed of any changes. Your Inpatient Rehabilitation Care Coordinator's name and contact numbers are listed  below.  The following services may also be recommended but are not provided by the [facility] will be made to provide these services after discharge if needed.  Arrangements include referral to agencies that provide these services.  Your insurance has been verified to be:  Medicare A/B  Your primary  doctor is:  Deborra Medina  Pertinent information will be shared with your doctor and your insurance company.  Inpatient Rehabilitation Care Coordinator:  Cathleen Corti [phone] or [phone]  Information discussed with and copy given to patient by: Rana Snare, 03/26/2022, 10:08 AM

## 2022-03-26 NOTE — Progress Notes (Signed)
PROGRESS NOTE   Subjective/Complaints:  Pt reports had good BM yesterday- "got cleaned out".  Feeling "fair"-  Hurts in R groin with movement of any kind- even pulling up on pannus to expose R groin wound.   Nursing wanted me to assess R groin  ROS:  Pt denies SOB, abd pain, CP, N/V/C/D, and vision changes   Objective:   No results found. Recent Labs    03/25/22 0524  WBC 8.1  HGB 9.0*  HCT 27.7*  PLT 327   Recent Labs    03/25/22 0524 03/26/22 0540  NA 135 134*  K 4.5 4.4  CL 101 103  CO2 27 26  GLUCOSE 104* 105*  BUN 25* 24*  CREATININE 1.89* 1.77*  CALCIUM 9.4 9.1    Intake/Output Summary (Last 24 hours) at 03/26/2022 0837 Last data filed at 03/25/2022 2300 Gross per 24 hour  Intake 2897 ml  Output --  Net 2897 ml        Physical Exam: Vital Signs Blood pressure (!) 174/61, pulse 70, temperature 98.1 F (36.7 C), resp. rate 18, height 5\' 1"  (1.549 m), weight 68.8 kg, SpO2 100 %.    General: awake, alert, appropriate, laying in bed; nursing at bedside; wincing with R groin wound change; still frail appearing; NAD HENT: conjugate gaze; oropharynx moist CV: regular rate; no JVD Pulmonary: CTA B/L; no W/R/R- good air movement- no fluid overload GI: soft, NT, ND, (+)BS- normoactive Psychiatric: appropriate- slightly flat affect Neurological: alert Skin:    Comments: Bilateral femoral endarterectomy sites with some oozing of right groin. Still  very trace oozing, but dressing soaked from yesterday- staples intact- mild edema; no erythema Neurological:     Comments: Patient is alert.  Makes eye contact with examiner.  Follows simple commands.  Provides name and age but could not recall full hospital course. Significant lower extremity weakness R>L. 3/5 strength left lower extremity, 2/5 right lower extremity   Assessment/Plan: 1. Functional deficits which require 3+ hours per day of  interdisciplinary therapy in a comprehensive inpatient rehab setting. Physiatrist is providing close team supervision and 24 hour management of active medical problems listed below. Physiatrist and rehab team continue to assess barriers to discharge/monitor patient progress toward functional and medical goals  Care Tool:  Bathing    Body parts bathed by patient: Right arm, Left arm, Chest, Abdomen, Front perineal area, Buttocks, Right upper leg, Left upper leg, Face   Body parts bathed by helper: Right lower leg, Left lower leg     Bathing assist Assist Level: Minimal Assistance - Patient > 75%     Upper Body Dressing/Undressing Upper body dressing   What is the patient wearing?: Hospital gown only    Upper body assist Assist Level: Minimal Assistance - Patient > 75%    Lower Body Dressing/Undressing Lower body dressing      What is the patient wearing?: Underwear/pull up     Lower body assist Assist for lower body dressing: Total Assistance - Patient < 25%     Toileting Toileting    Toileting assist Assist for toileting: Contact Guard/Touching assist     Transfers Chair/bed transfer  Transfers assist  Chair/bed transfer assist level: Moderate Assistance - Patient 50 - 74%     Locomotion Ambulation   Ambulation assist      Assist level: Moderate Assistance - Patient 50 - 74% Assistive device: Hand held assist Max distance: 120'   Walk 10 feet activity   Assist     Assist level: Moderate Assistance - Patient - 50 - 74% Assistive device: Hand held assist   Walk 50 feet activity   Assist    Assist level: Moderate Assistance - Patient - 50 - 74% Assistive device: Hand held assist    Walk 150 feet activity   Assist Walk 150 feet activity did not occur: Safety/medical concerns         Walk 10 feet on uneven surface  activity   Assist Walk 10 feet on uneven surfaces activity did not occur: Safety/medical concerns          Wheelchair     Assist Is the patient using a wheelchair?: No             Wheelchair 50 feet with 2 turns activity    Assist            Wheelchair 150 feet activity     Assist          Blood pressure (!) 174/61, pulse 70, temperature 98.1 F (36.7 C), resp. rate 18, height 5\' 1"  (1.549 m), weight 68.8 kg, SpO2 100 %.  Medical Problem List and Plan: 1. Functional deficits secondary to debility secondary to peripheral vascular disease with debilitating claudication.  Status post bilateral endarterectomies with stent placement 03/17/2022 per Dr. Leotis Pain             -patient may shower             -ELOS/Goals: 5-7 days S            Con't CIR- PT, and OT 2.  Antithrombotics: -DVT/anticoagulation:  Mechanical: Antiembolism stockings, thigh (TED hose) Bilateral lower extremities             -antiplatelet therapy: Aspirin 81 mg daily and Plavix 75 mg daily 3. Pain Management:/Chronic hip pain L rib fx from CPR: continue oxycodone as needed  6/1- having Rib pain from fracture under L breast- suggest asking for pain meds prior to therapy- let nurse know 4. Mood: Provided emotional support             -antipsychotic agents: N/A 5. Neuropsych: This patient is capable of making decisions on her own behalf. 6. Skin/Wound Care: Routine skin checks 7. Fluids/Electrolytes/Nutrition: Routine in and outs with follow-up chemistries 8.  Postoperative cardiopulmonary arrest/non-STEMI with history of CAD/CABG.  Patient did receive CPR x2 minutes.  Follow-up cardiology services.  Continue Ranexa 500 mg twice daily.  Plan outpatient cardiac catheterization 9.  Acute on chronic anemia.  Continue iron supplement.  Follow-up CBC  6/1- Up slightly, however is also having renal changes, so could be form that.  10.  CKD stage III.  Baseline creatinine 1.42-1.56.  Follow-up chemistries  6/1- see #15 11.  Diastolic congestive heart failure.  Monitor for any signs of fluid overload 6/1-  will order daily weights 12.  Hypertension.  Aldactone 25 mg daily, Avapro 300 mg daily, labetalol 300 mg twice daily, HCTZ 12.5 mg daily.  Monitor with increased mobility  6/2- stopped HCTZ due to renal issues- BP was great until this AM- will monitor and see what might need to be changed- monitor trend 13.  Hyperlipidemia.  Zetia 14.  Obesity.  BMI 30.53.  Dietary follow-up  6/2- BMI 28.66 now- will monitor- large change.   15. Acute renal insuffiencey in setting of DCHF and CKD3-  6/1- Cr up to 1.89 from 1.50- BUN 25- will give 1 L IVFs 50cc/hour so don't overload and check daily weights and monitor closely for SOB.    6/2- Cr down to 1.77- still was receiving IVFs this AM, was blocked line o/n that took time to fix per nursing- so will recheck BMP in Am and determine if needs more IVFs- push fluids as well- order placed 16. Constipation-   6/1- last BM 2 days ago- wants milk of mg- however with renal issues, will do sorbitol- will order after therapy.   6/2- LBM overnight- pt said was cleaned out- con't regimen   I spent a total of  38  minutes on total care today- >50% coordination of care- due to d/w nursing and seeing twice- for wound care assessment- and IPOC  LOS: 2 days A FACE TO FACE EVALUATION WAS PERFORMED  Lliam Hoh 03/26/2022, 8:37 AM

## 2022-03-26 NOTE — Progress Notes (Signed)
Patient ID: Sabrina [name], female   DOB: [birth year], 81 y.o.   MRN: [id number]  SW made contact with pt dtr [name] to inform on ELOS. SW informed will provide updates after team conference on Tuesday.   Loralee Pacas, MSW, [place] Office: [phone] Cell: [phone] Fax: [phone]

## 2022-03-27 DIAGNOSIS — R5381 Other malaise: Secondary | ICD-10-CM | POA: Diagnosis not present

## 2022-03-27 DIAGNOSIS — N289 Disorder of kidney and ureter, unspecified: Secondary | ICD-10-CM | POA: Diagnosis not present

## 2022-03-27 DIAGNOSIS — I10 Essential (primary) hypertension: Secondary | ICD-10-CM

## 2022-03-27 DIAGNOSIS — K5901 Slow transit constipation: Secondary | ICD-10-CM | POA: Diagnosis not present

## 2022-03-27 LAB — BASIC METABOLIC PANEL
Anion gap: 7 (ref 5–15)
BUN: 26 mg/dL — ABNORMAL HIGH (ref 8–23)
CO2: 26 mmol/L (ref 22–32)
Calcium: 9.4 mg/dL (ref 8.9–10.3)
Chloride: 104 mmol/L (ref 98–111)
Creatinine, Ser: 1.89 mg/dL — ABNORMAL HIGH (ref 0.44–1.00)
GFR, Estimated: 27 mL/min — ABNORMAL LOW (ref >60)
Glucose, Bld: 103 mg/dL — ABNORMAL HIGH (ref 70–99)
Potassium: 4.3 mmol/L (ref 3.5–5.1)
Sodium: 137 mmol/L (ref 135–145)

## 2022-03-27 NOTE — Progress Notes (Signed)
PROGRESS NOTE   Subjective/Complaints: Was feeling nauseous earlier today and felt this may be due to taking medicine on an empty stomach. She is now feeling better Has no other complaints  ROS:  Pt denies SOB, abd pain, CP, N/V/C/D, and vision changes   Objective:   No results found. Recent Labs    03/25/22 0524  WBC 8.1  HGB 9.0*  HCT 27.7*  PLT 327   Recent Labs    03/26/22 0540 03/27/22 0614  NA 134* 137  K 4.4 4.3  CL 103 104  CO2 26 26  GLUCOSE 105* 103*  BUN 24* 26*  CREATININE 1.77* 1.89*  CALCIUM 9.1 9.4    Intake/Output Summary (Last 24 hours) at 03/27/2022 1250 Last data filed at 03/27/2022 0800 Gross per 24 hour  Intake 357 ml  Output --  Net 357 ml        Physical Exam: Vital Signs Blood pressure (!) 155/54, pulse 72, temperature 98.2 F (36.8 C), temperature source Oral, resp. rate 16, height 5\' 1"  (1.549 m), weight 68.8 kg, SpO2 98 %.  Gen: no distress, normal appearing HEENT: oral mucosa pink and moist, NCAT Cardio: Reg rate Chest: normal effort, normal rate of breathing Abd: soft, non-distended Ext: no edema Psych: pleasant, normal affect Skin:    Comments: Bilateral femoral endarterectomy sites with some oozing of right groin. Still  very trace oozing, but dressing soaked from yesterday- staples intact- mild edema; no erythema Neurological:     Comments: Patient is alert.  Makes eye contact with examiner.  Follows simple commands.  Provides name and age but could not recall full hospital course. Significant lower extremity weakness R>L. 3/5 strength left lower extremity, 2/5 right lower extremity   Assessment/Plan: 1. Functional deficits which require 3+ hours per day of interdisciplinary therapy in a comprehensive inpatient rehab setting. Physiatrist is providing close team supervision and 24 hour management of active medical problems listed below. Physiatrist and rehab team  continue to assess barriers to discharge/monitor patient progress toward functional and medical goals  Care Tool:  Bathing    Body parts bathed by patient: Right arm, Left arm, Chest, Abdomen, Front perineal area, Buttocks, Right upper leg, Left upper leg, Face   Body parts bathed by helper: Right lower leg, Left lower leg     Bathing assist Assist Level: Minimal Assistance - Patient > 75%     Upper Body Dressing/Undressing Upper body dressing   What is the patient wearing?: Hospital gown only    Upper body assist Assist Level: Minimal Assistance - Patient > 75%    Lower Body Dressing/Undressing Lower body dressing      What is the patient wearing?: Pants     Lower body assist Assist for lower body dressing: Minimal Assistance - Patient > 75%     Toileting Toileting    Toileting assist Assist for toileting: Set up assist     Transfers Chair/bed transfer  Transfers assist     Chair/bed transfer assist level: Moderate Assistance - Patient 50 - 74%     Locomotion Ambulation   Ambulation assist      Assist level: Moderate Assistance - Patient 50 - 74%  Assistive device: Hand held assist Max distance: 120'   Walk 10 feet activity   Assist     Assist level: Moderate Assistance - Patient - 50 - 74% Assistive device: Hand held assist   Walk 50 feet activity   Assist    Assist level: Moderate Assistance - Patient - 50 - 74% Assistive device: Hand held assist    Walk 150 feet activity   Assist Walk 150 feet activity did not occur: Safety/medical concerns         Walk 10 feet on uneven surface  activity   Assist Walk 10 feet on uneven surfaces activity did not occur: Safety/medical concerns         Wheelchair     Assist Is the patient using a wheelchair?: No             Wheelchair 50 feet with 2 turns activity    Assist            Wheelchair 150 feet activity     Assist          Blood pressure (!)  155/54, pulse 72, temperature 98.2 F (36.8 C), temperature source Oral, resp. rate 16, height 5\' 1"  (1.549 m), weight 68.8 kg, SpO2 98 %.  Medical Problem List and Plan: 1. Functional deficits secondary to debility secondary to peripheral vascular disease with debilitating claudication.  Status post bilateral endarterectomies with stent placement 03/17/2022 per Dr. Leotis Pain             -patient may shower             -ELOS/Goals: 5-7 days S            Continue CIR- PT, and OT 2.  Antithrombotics: -DVT/anticoagulation:  Mechanical: Antiembolism stockings, thigh (TED hose) Bilateral lower extremities             -antiplatelet therapy: Aspirin 81 mg daily and Plavix 75 mg daily 3. Pain Management:/Chronic hip pain L rib fx from CPR: continue oxycodone as needed  6/1- having Rib pain from fracture under L breast- suggest asking for pain meds prior to therapy- let nurse know 4. Mood: Provided emotional support             -antipsychotic agents: N/A 5. Neuropsych: This patient is capable of making decisions on her own behalf. 6. Skin/Wound Care: Routine skin checks 7. Fluids/Electrolytes/Nutrition: Routine in and outs with follow-up chemistries 8.  Postoperative cardiopulmonary arrest/non-STEMI with history of CAD/CABG.  Patient did receive CPR x2 minutes.  Follow-up cardiology services.  Continue Ranexa 500 mg twice daily.  Plan outpatient cardiac catheterization 9.  Acute on chronic anemia.  Continue iron supplement.  Follow-up CBC  6/1- Up slightly, however is also having renal changes, so could be form that.  10.  CKD stage III.  Baseline creatinine 1.42-1.56.  Follow-up chemistries  6/3: placed nursing order to encourage 6-8 glasses of water per day given rise in creatinine.  11.  Diastolic congestive heart failure.  Monitor for any signs of fluid overload 6/1- will order daily weights 6/3: weights down compared to last week, continue to monitor.  12.  Hypertension.  Aldactone 25 mg daily,  Avapro 300 mg daily, labetalol 300 mg twice daily, HCTZ 12.5 mg daily.  Monitor with increased mobility  6/2- stopped HCTZ due to renal issues- BP was great until this AM- will monitor and see what might need to be changed- monitor trend  6/3: slightly elevated this morning but will not restart HCTZ  given elevated creatinine.  13.  Hyperlipidemia.  Zetia 14.  Obesity.  BMI 30.53.  Dietary follow-up  6/2- BMI 28.66 now- will monitor- large change.   15. Acute renal insuffiencey in setting of DCHF and CKD3-  6/1- Cr up to 1.89 from 1.50- BUN 25- will give 1 L IVFs 50cc/hour so don't overload and check daily weights and monitor closely for SOB.    6/2- Cr down to 1.77- still was receiving IVFs this AM, was blocked line o/n that took time to fix per nursing- so will recheck BMP in Am and determine if needs more IVFs- push fluids as well- order placed 16. Constipation-   6/1- last BM 2 days ago- wants milk of mg- however with renal issues, will do sorbitol- will order after therapy.   6/2- LBM overnight- pt said was cleaned out- con't regimen    LOS: 3 days A FACE TO FACE EVALUATION WAS PERFORMED  Martha Clan P Yulisa Chirico 03/27/2022, 12:50 PM

## 2022-03-28 DIAGNOSIS — I10 Essential (primary) hypertension: Secondary | ICD-10-CM | POA: Diagnosis not present

## 2022-03-28 DIAGNOSIS — R5381 Other malaise: Secondary | ICD-10-CM | POA: Diagnosis not present

## 2022-03-28 DIAGNOSIS — K5901 Slow transit constipation: Secondary | ICD-10-CM | POA: Diagnosis not present

## 2022-03-28 DIAGNOSIS — N289 Disorder of kidney and ureter, unspecified: Secondary | ICD-10-CM | POA: Diagnosis not present

## 2022-03-28 MED ORDER — DOCUSATE SODIUM 100 MG PO CAPS
100.0000 mg | ORAL_CAPSULE | Freq: Every day | ORAL | Status: DC
  Administered 2022-03-28 – 2022-03-29 (×2): 100 mg via ORAL
  Filled 2022-03-28 (×2): qty 1

## 2022-03-28 NOTE — Progress Notes (Signed)
Physical Therapy Note  Patient Details  Name: Sabrina [name] MRN: [id number] Date of Birth: [birth year] Today's Date: 03/28/2022    Attempted to see patient for scheduled AM session. Pt received seated in bed with emesis bag in place. Pt reports she has not been feeling well this AM with nausea and vomiting. Pt declines participation at this time due to not feeling well, agreeable to attempt participation in PM session. Pt left seated in bed with needs in reach. Pt missed 60 min of scheduled therapy session due to nausea and vomiting, will attempt to follow up per POC.    Excell Seltzer, PT, DPT, CSRS 03/28/2022, 12:04 PM

## 2022-03-28 NOTE — Progress Notes (Addendum)
Notified Dr. Ranell Patrick of opening to right incision (slightly open).   Yehuda Mao, LPN

## 2022-03-28 NOTE — Progress Notes (Signed)
Physical Therapy Session Note  Patient Details  Name: Sabrina [name] MRN: [id number] Date of Birth: Oct 01, 1941  Today's Date: 03/28/2022 PT Individual Time: 1500-1525 PT Individual Time Calculation (min): 25 min   Short Term Goals: Week 1:  PT Short Term Goal 1 (Week 1): =LTG due to ELOS  Skilled Therapeutic Interventions/Progress Updates:    Pt received seated in bed, agreeable to PT session. Pt reports feeling much better than in AM. Pt has no pain at rest, has onset of R>L groin pain with mobility. Supine to sit with min A needed for RLE management. Assisted pt with donning pants and house coat while seated EOB. Sit to stand with Supervision to RW. Ambulatory transfer into bathroom with RW and CGA. Toilet transfer with RW and CGA. Pt requires some assist for clothing management, independent for pericare. Ambulation x 150 ft with RW and CGA, very slow cadence due to pain. Pt requests to return to bed following ambulation due to fatigue. Sit to supine mod A needed for BLE management. Pt left seated in bed with needs in reach, bed alarm in place, family present.   Therapy Documentation Precautions:  Precautions Precautions: Fall Precaution Comments: rib fx from CPR; pain in LEs Restrictions Weight Bearing Restrictions: No      Therapy/Group: Individual Therapy   Excell Seltzer, PT, DPT, CSRS 03/28/2022, 3:26 PM

## 2022-03-28 NOTE — Progress Notes (Signed)
Occupational Therapy Session Note  Patient Details  Name: Sabrina [name] MRN: [id number] Date of Birth: 21-Mar-1941  Today's Date: 03/28/2022 OT Individual Time: 1400-1425 OT Individual Time Calculation (min): 25 min    Short Term Goals: Week 1:  OT Short Term Goal 1 (Week 1): STG=LTG  Skilled Therapeutic Interventions/Progress Updates:    Pt resting in bed upon arrival. Pt reports that she feels better then earlier in day. Pt agreeable to getting OOB. Supine>sit EOB with mod A. Pt donned pants and house coat with sit<>stand from EOB. Sit<>stand and amb with RW in hallway with CGA. Pt amb 125'x2 with rest break. Pt returned to room. Max A for sit>supine. Pt remained in bed with all needs within reach and bed alarm activated.   Therapy Documentation Precautions:  Precautions Precautions: Fall Precaution Comments: rib fx from CPR; pain in LEs Restrictions Weight Bearing Restrictions: No Pain: Pt grimaces with all transitional movement; repositioned and LPN aware   Therapy/Group: Individual Therapy  Leroy Libman 03/28/2022, 2:31 PM

## 2022-03-28 NOTE — Progress Notes (Signed)
PROGRESS NOTE   Subjective/Complaints: Still feels "off," not nauseous. Says she does feel constipated. Does not want anything too strong. Colace added. Incision with slight opening- no drainage. Vitals stable. She has some pain along incision- stable amount. Discussed doe not appear infected.   ROS:  Pt denies SOB, abd pain, CP, N/V/C/D, and vision changes, +malaise   Objective:   No results found. No results for input(s): WBC, HGB, HCT, PLT in the last 72 hours.  Recent Labs    03/26/22 0540 03/27/22 0614  NA 134* 137  K 4.4 4.3  CL 103 104  CO2 26 26  GLUCOSE 105* 103*  BUN 24* 26*  CREATININE 1.77* 1.89*  CALCIUM 9.1 9.4    Intake/Output Summary (Last 24 hours) at 03/28/2022 1018 Last data filed at 03/28/2022 0815 Gross per 24 hour  Intake 596 ml  Output --  Net 596 ml        Physical Exam: Vital Signs Blood pressure (!) 170/50, pulse 70, temperature 98.5 F (36.9 C), temperature source Oral, resp. rate 16, height 5\' 1"  (1.549 m), weight 68.2 kg, SpO2 100 %.  Gen: no distress, normal appearing HEENT: oral mucosa pink and moist, NCAT Cardio: Reg rate Chest: normal effort, normal rate of breathing Abd: soft, non-distended Ext: no edema Psych: pleasant, normal affect Skin:    Comments: Bilateral femoral endarterectomy sites with some oozing of right groin. Still  very trace oozing, but dressing soaked from yesterday- staples intact- mild edema; no erythema Neurological:     Comments: Patient is alert.  Makes eye contact with examiner.  Follows simple commands.  Provides name and age but could not recall full hospital course. Significant lower extremity weakness R>L. 3/5 strength left lower extremity, 2/5 right lower extremity    Assessment/Plan: 1. Functional deficits which require 3+ hours per day of interdisciplinary therapy in a comprehensive inpatient rehab setting. Physiatrist is providing close  team supervision and 24 hour management of active medical problems listed below. Physiatrist and rehab team continue to assess barriers to discharge/monitor patient progress toward functional and medical goals  Care Tool:  Bathing    Body parts bathed by patient: Right arm, Left arm, Chest, Abdomen, Front perineal area, Buttocks, Right upper leg, Left upper leg, Face   Body parts bathed by helper: Right lower leg, Left lower leg     Bathing assist Assist Level: Minimal Assistance - Patient > 75%     Upper Body Dressing/Undressing Upper body dressing   What is the patient wearing?: Hospital gown only    Upper body assist Assist Level: Minimal Assistance - Patient > 75%    Lower Body Dressing/Undressing Lower body dressing      What is the patient wearing?: Pants     Lower body assist Assist for lower body dressing: Minimal Assistance - Patient > 75%     Toileting Toileting    Toileting assist Assist for toileting: Set up assist     Transfers Chair/bed transfer  Transfers assist     Chair/bed transfer assist level: Moderate Assistance - Patient 50 - 74%     Locomotion Ambulation   Ambulation assist      Assist  level: Moderate Assistance - Patient 50 - 74% Assistive device: Hand held assist Max distance: 120'   Walk 10 feet activity   Assist     Assist level: Moderate Assistance - Patient - 50 - 74% Assistive device: Hand held assist   Walk 50 feet activity   Assist    Assist level: Moderate Assistance - Patient - 50 - 74% Assistive device: Hand held assist    Walk 150 feet activity   Assist Walk 150 feet activity did not occur: Safety/medical concerns         Walk 10 feet on uneven surface  activity   Assist Walk 10 feet on uneven surfaces activity did not occur: Safety/medical concerns         Wheelchair     Assist Is the patient using a wheelchair?: No             Wheelchair 50 feet with 2 turns  activity    Assist            Wheelchair 150 feet activity     Assist          Blood pressure (!) 170/50, pulse 70, temperature 98.5 F (36.9 C), temperature source Oral, resp. rate 16, height 5\' 1"  (1.549 m), weight 68.2 kg, SpO2 100 %.  Medical Problem List and Plan: 1. Functional deficits secondary to debility secondary to peripheral vascular disease with debilitating claudication.  Status post bilateral endarterectomies with stent placement 03/17/2022 per Dr. Leotis Pain             -patient may shower             -ELOS/Goals: 5-7 days S            Continue CIR- PT, and OT 2.  Antithrombotics: -DVT/anticoagulation:  Mechanical: Antiembolism stockings, thigh (TED hose) Bilateral lower extremities             -antiplatelet therapy: Aspirin 81 mg daily and Plavix 75 mg daily 3. Pain Management:/Chronic hip pain L rib fx from CPR: continue oxycodone as needed  6/1- having Rib pain from fracture under L breast- suggest asking for pain meds prior to therapy- let nurse know 4. Mood: Provided emotional support             -antipsychotic agents: N/A 5. Neuropsych: This patient is capable of making decisions on her own behalf. 6. Groin incision: slight opening, no drainage, continue to monitor daily.  7. Fluids/Electrolytes/Nutrition: Routine in and outs with follow-up chemistries 8.  Postoperative cardiopulmonary arrest/non-STEMI with history of CAD/CABG.  Patient did receive CPR x2 minutes.  Follow-up cardiology services.  Continue Ranexa 500 mg twice daily.  Plan outpatient cardiac catheterization 9.  Acute on chronic anemia.  Continue iron supplement.  Follow-up CBC  6/1- Up slightly, however is also having renal changes, so could be form that.  10.  CKD stage III.  Baseline creatinine 1.42-1.56.  Follow-up chemistries  6/3: placed nursing order to encourage 6-8 glasses of water per day given rise in creatinine.  11.  Diastolic congestive heart failure.  Monitor for any signs  of fluid overload 6/1- will order daily weights 6/3: weights down compared to last week, continue to monitor.  12.  Hypertension.  Aldactone 25 mg daily, Avapro 300 mg daily, labetalol 300 mg twice daily, HCTZ 12.5 mg daily.  Monitor with increased mobility  6/2- stopped HCTZ due to renal issues- BP was great until this AM- will monitor and see what might need to be  changed- monitor trend  6/3: slightly elevated this morning but will not restart HCTZ given elevated creatinine.  13.  Hyperlipidemia.  Zetia 14.  Obesity.  BMI 30.53.  Dietary follow-up  6/2- BMI 28.66 now- will monitor- large change.   15. Acute renal insuffiencey in setting of DCHF and CKD3-  6/1- Cr up to 1.89 from 1.50- BUN 25- will give 1 L IVFs 50cc/hour so don't overload and check daily weights and monitor closely for SOB.    6/2- Cr down to 1.77- still was receiving IVFs this AM, was blocked line o/n that took time to fix per nursing- so will recheck BMP in Am and determine if needs more IVFs- push fluids as well- order placed 16. Constipation-   6/1- last BM 2 days ago- wants milk of mg- however with renal issues, will do sorbitol- will order after therapy.   6/2- LBM overnight- pt said was cleaned out- con't regimen  6/4: add colace    LOS: 4 days A FACE TO FACE EVALUATION WAS PERFORMED  Sabrina [name] P Khi Mcmillen 03/28/2022, 10:18 AM

## 2022-03-28 NOTE — Progress Notes (Signed)
Occupational Therapy Session Note  Patient Details  Name: Sabrina [name] MRN: [id number] Date of Birth: May 13, 1941  Today's Date: 03/28/2022 OT Individual Time: 0930-1010 OT Individual Time Calculation (min): 40 min  and Today's Date: 03/28/2022 OT Missed Time: 35 Minutes Missed Time Reason: Pain;Other (comment) (n/v)   Short Term Goals: Week 1:  OT Short Term Goal 1 (Week 1): STG=LTG  Skilled Therapeutic Interventions/Progress Updates:    Pt resting in bed upon arrival. Pt reports increased pain around incisions (see below) but willing to "give it a try." Supine>sit EOB with min A and increased pain. Pt reports slight dizziness upon sitting up but resolved. Pt requested to use toilet. Amb with RW to bathroom with CGA. Toileting with supervision. Pt c/o increased pain and nausea. Pt used w/c to return to room. N/v increased while at sink, in addition to pain. Pt unable to continue. Stand pivot tranfser to EOB with CGA. Sit>supine with mod A for BLE mgmt. Pt able to reposition in bed without assistance. Pt remained in bed with all needs within reach. Bed alarm activated.   Therapy Documentation Precautions:  Precautions Precautions: Fall Precaution Comments: rib fx from CPR; pain in LEs Restrictions Weight Bearing Restrictions: No General: General OT Amount of Missed Time: 35 Minutes  Pain: Pt c/o 7/10 pain in Bil groin incisions, increasing with movement; rest and nursing notified   Therapy/Group: Individual Therapy  Leroy Libman 03/28/2022, 10:18 AM

## 2022-03-29 ENCOUNTER — Inpatient Hospital Stay (HOSPITAL_COMMUNITY)
Admission: RE | Admit: 2022-03-29 | Discharge: 2022-04-05 | DRG: 252 | Disposition: A | Discharge status: Rehabilitation Facility | Payer: Medicare Other | Source: Ambulatory Visit | Attending: Family Medicine | Admitting: Family Medicine

## 2022-03-29 ENCOUNTER — Inpatient Hospital Stay (HOSPITAL_COMMUNITY): Payer: Medicare Other

## 2022-03-29 DIAGNOSIS — I5032 Chronic diastolic (congestive) heart failure: Secondary | ICD-10-CM | POA: Diagnosis not present

## 2022-03-29 DIAGNOSIS — R6521 Severe sepsis with septic shock: Secondary | ICD-10-CM | POA: Diagnosis not present

## 2022-03-29 DIAGNOSIS — R0689 Other abnormalities of breathing: Secondary | ICD-10-CM | POA: Diagnosis not present

## 2022-03-29 DIAGNOSIS — T8149XA Infection following a procedure, other surgical site, initial encounter: Secondary | ICD-10-CM

## 2022-03-29 DIAGNOSIS — I350 Nonrheumatic aortic (valve) stenosis: Secondary | ICD-10-CM | POA: Diagnosis present

## 2022-03-29 DIAGNOSIS — R197 Diarrhea, unspecified: Secondary | ICD-10-CM

## 2022-03-29 DIAGNOSIS — D631 Anemia in chronic kidney disease: Secondary | ICD-10-CM | POA: Diagnosis not present

## 2022-03-29 DIAGNOSIS — N189 Chronic kidney disease, unspecified: Secondary | ICD-10-CM | POA: Diagnosis not present

## 2022-03-29 DIAGNOSIS — E785 Hyperlipidemia, unspecified: Secondary | ICD-10-CM | POA: Diagnosis present

## 2022-03-29 DIAGNOSIS — Z888 Allergy status to other drugs, medicaments and biological substances status: Secondary | ICD-10-CM

## 2022-03-29 DIAGNOSIS — K219 Gastro-esophageal reflux disease without esophagitis: Secondary | ICD-10-CM | POA: Diagnosis present

## 2022-03-29 DIAGNOSIS — E782 Mixed hyperlipidemia: Secondary | ICD-10-CM | POA: Diagnosis not present

## 2022-03-29 DIAGNOSIS — Z9049 Acquired absence of other specified parts of digestive tract: Secondary | ICD-10-CM

## 2022-03-29 DIAGNOSIS — Z8674 Personal history of sudden cardiac arrest: Secondary | ICD-10-CM | POA: Diagnosis not present

## 2022-03-29 DIAGNOSIS — F411 Generalized anxiety disorder: Secondary | ICD-10-CM | POA: Diagnosis not present

## 2022-03-29 DIAGNOSIS — I2510 Atherosclerotic heart disease of native coronary artery without angina pectoris: Secondary | ICD-10-CM | POA: Diagnosis not present

## 2022-03-29 DIAGNOSIS — N1831 Chronic kidney disease, stage 3a: Secondary | ICD-10-CM | POA: Diagnosis not present

## 2022-03-29 DIAGNOSIS — Z8616 Personal history of COVID-19: Secondary | ICD-10-CM

## 2022-03-29 DIAGNOSIS — Z87828 Personal history of other (healed) physical injury and trauma: Secondary | ICD-10-CM | POA: Diagnosis not present

## 2022-03-29 DIAGNOSIS — Z79899 Other long term (current) drug therapy: Secondary | ICD-10-CM | POA: Diagnosis not present

## 2022-03-29 DIAGNOSIS — I517 Cardiomegaly: Secondary | ICD-10-CM | POA: Diagnosis not present

## 2022-03-29 DIAGNOSIS — Z7902 Long term (current) use of antithrombotics/antiplatelets: Secondary | ICD-10-CM

## 2022-03-29 DIAGNOSIS — Z886 Allergy status to analgesic agent status: Secondary | ICD-10-CM

## 2022-03-29 DIAGNOSIS — E038 Other specified hypothyroidism: Secondary | ICD-10-CM | POA: Diagnosis not present

## 2022-03-29 DIAGNOSIS — D638 Anemia in other chronic diseases classified elsewhere: Secondary | ICD-10-CM

## 2022-03-29 DIAGNOSIS — D649 Anemia, unspecified: Secondary | ICD-10-CM | POA: Diagnosis not present

## 2022-03-29 DIAGNOSIS — R140 Abdominal distension (gaseous): Secondary | ICD-10-CM | POA: Diagnosis not present

## 2022-03-29 DIAGNOSIS — B965 Pseudomonas (aeruginosa) (mallei) (pseudomallei) as the cause of diseases classified elsewhere: Secondary | ICD-10-CM | POA: Diagnosis not present

## 2022-03-29 DIAGNOSIS — I110 Hypertensive heart disease with heart failure: Secondary | ICD-10-CM | POA: Diagnosis not present

## 2022-03-29 DIAGNOSIS — I739 Peripheral vascular disease, unspecified: Secondary | ICD-10-CM | POA: Diagnosis present

## 2022-03-29 DIAGNOSIS — I77811 Abdominal aortic ectasia: Secondary | ICD-10-CM | POA: Diagnosis not present

## 2022-03-29 DIAGNOSIS — E871 Hypo-osmolality and hyponatremia: Secondary | ICD-10-CM | POA: Diagnosis not present

## 2022-03-29 DIAGNOSIS — Z90710 Acquired absence of both cervix and uterus: Secondary | ICD-10-CM | POA: Diagnosis not present

## 2022-03-29 DIAGNOSIS — Z95828 Presence of other vascular implants and grafts: Secondary | ICD-10-CM | POA: Diagnosis not present

## 2022-03-29 DIAGNOSIS — R109 Unspecified abdominal pain: Secondary | ICD-10-CM | POA: Diagnosis not present

## 2022-03-29 DIAGNOSIS — I700 Atherosclerosis of aorta: Secondary | ICD-10-CM | POA: Diagnosis not present

## 2022-03-29 DIAGNOSIS — I252 Old myocardial infarction: Secondary | ICD-10-CM | POA: Diagnosis not present

## 2022-03-29 DIAGNOSIS — A419 Sepsis, unspecified organism: Secondary | ICD-10-CM | POA: Diagnosis not present

## 2022-03-29 DIAGNOSIS — Z89421 Acquired absence of other right toe(s): Secondary | ICD-10-CM

## 2022-03-29 DIAGNOSIS — N2581 Secondary hyperparathyroidism of renal origin: Secondary | ICD-10-CM | POA: Diagnosis present

## 2022-03-29 DIAGNOSIS — T827XXA Infection and inflammatory reaction due to other cardiac and vascular devices, implants and grafts, initial encounter: Secondary | ICD-10-CM | POA: Diagnosis not present

## 2022-03-29 DIAGNOSIS — I272 Pulmonary hypertension, unspecified: Secondary | ICD-10-CM | POA: Diagnosis not present

## 2022-03-29 DIAGNOSIS — R52 Pain, unspecified: Secondary | ICD-10-CM | POA: Diagnosis not present

## 2022-03-29 DIAGNOSIS — B999 Unspecified infectious disease: Secondary | ICD-10-CM | POA: Diagnosis not present

## 2022-03-29 DIAGNOSIS — Z87891 Personal history of nicotine dependence: Secondary | ICD-10-CM

## 2022-03-29 DIAGNOSIS — Z8249 Family history of ischemic heart disease and other diseases of the circulatory system: Secondary | ICD-10-CM

## 2022-03-29 DIAGNOSIS — N179 Acute kidney failure, unspecified: Secondary | ICD-10-CM | POA: Diagnosis present

## 2022-03-29 DIAGNOSIS — E876 Hypokalemia: Secondary | ICD-10-CM | POA: Diagnosis not present

## 2022-03-29 DIAGNOSIS — I130 Hypertensive heart and chronic kidney disease with heart failure and stage 1 through stage 4 chronic kidney disease, or unspecified chronic kidney disease: Secondary | ICD-10-CM | POA: Diagnosis not present

## 2022-03-29 DIAGNOSIS — Z951 Presence of aortocoronary bypass graft: Secondary | ICD-10-CM | POA: Diagnosis not present

## 2022-03-29 DIAGNOSIS — I214 Non-ST elevation (NSTEMI) myocardial infarction: Secondary | ICD-10-CM | POA: Diagnosis not present

## 2022-03-29 DIAGNOSIS — I10 Essential (primary) hypertension: Secondary | ICD-10-CM | POA: Diagnosis not present

## 2022-03-29 DIAGNOSIS — I25810 Atherosclerosis of coronary artery bypass graft(s) without angina pectoris: Secondary | ICD-10-CM | POA: Diagnosis not present

## 2022-03-29 DIAGNOSIS — I509 Heart failure, unspecified: Secondary | ICD-10-CM | POA: Diagnosis not present

## 2022-03-29 DIAGNOSIS — Z960 Presence of urogenital implants: Secondary | ICD-10-CM | POA: Diagnosis present

## 2022-03-29 DIAGNOSIS — Z885 Allergy status to narcotic agent status: Secondary | ICD-10-CM

## 2022-03-29 DIAGNOSIS — I9789 Other postprocedural complications and disorders of the circulatory system, not elsewhere classified: Secondary | ICD-10-CM | POA: Diagnosis not present

## 2022-03-29 DIAGNOSIS — Y832 Surgical operation with anastomosis, bypass or graft as the cause of abnormal reaction of the patient, or of later complication, without mention of misadventure at the time of the procedure: Secondary | ICD-10-CM | POA: Diagnosis present

## 2022-03-29 DIAGNOSIS — Z955 Presence of coronary angioplasty implant and graft: Secondary | ICD-10-CM | POA: Diagnosis not present

## 2022-03-29 DIAGNOSIS — F32A Depression, unspecified: Secondary | ICD-10-CM | POA: Diagnosis present

## 2022-03-29 DIAGNOSIS — D62 Acute posthemorrhagic anemia: Secondary | ICD-10-CM | POA: Diagnosis not present

## 2022-03-29 DIAGNOSIS — N183 Chronic kidney disease, stage 3 unspecified: Secondary | ICD-10-CM | POA: Diagnosis not present

## 2022-03-29 DIAGNOSIS — R5381 Other malaise: Secondary | ICD-10-CM | POA: Diagnosis not present

## 2022-03-29 DIAGNOSIS — G894 Chronic pain syndrome: Secondary | ICD-10-CM | POA: Diagnosis not present

## 2022-03-29 DIAGNOSIS — I70229 Atherosclerosis of native arteries of extremities with rest pain, unspecified extremity: Secondary | ICD-10-CM | POA: Diagnosis not present

## 2022-03-29 DIAGNOSIS — R112 Nausea with vomiting, unspecified: Secondary | ICD-10-CM | POA: Insufficient documentation

## 2022-03-29 DIAGNOSIS — Z7982 Long term (current) use of aspirin: Secondary | ICD-10-CM

## 2022-03-29 DIAGNOSIS — N1832 Chronic kidney disease, stage 3b: Secondary | ICD-10-CM | POA: Diagnosis present

## 2022-03-29 DIAGNOSIS — T8140XA Infection following a procedure, unspecified, initial encounter: Secondary | ICD-10-CM | POA: Diagnosis not present

## 2022-03-29 DIAGNOSIS — Z79891 Long term (current) use of opiate analgesic: Secondary | ICD-10-CM

## 2022-03-29 DIAGNOSIS — I21A1 Myocardial infarction type 2: Secondary | ICD-10-CM | POA: Diagnosis not present

## 2022-03-29 DIAGNOSIS — Z96643 Presence of artificial hip joint, bilateral: Secondary | ICD-10-CM | POA: Diagnosis present

## 2022-03-29 DIAGNOSIS — F419 Anxiety disorder, unspecified: Secondary | ICD-10-CM | POA: Diagnosis not present

## 2022-03-29 DIAGNOSIS — T8141XA Infection following a procedure, superficial incisional surgical site, initial encounter: Secondary | ICD-10-CM | POA: Diagnosis not present

## 2022-03-29 DIAGNOSIS — E669 Obesity, unspecified: Secondary | ICD-10-CM | POA: Diagnosis not present

## 2022-03-29 DIAGNOSIS — Z880 Allergy status to penicillin: Secondary | ICD-10-CM

## 2022-03-29 DIAGNOSIS — Z95820 Peripheral vascular angioplasty status with implants and grafts: Secondary | ICD-10-CM | POA: Diagnosis not present

## 2022-03-29 HISTORY — DX: Infection following a procedure, other surgical site, initial encounter: T81.49XA

## 2022-03-29 LAB — COMPREHENSIVE METABOLIC PANEL
ALT: 13 U/L (ref 0–44)
ALT: 15 U/L (ref 0–44)
AST: 16 U/L (ref 15–41)
AST: 18 U/L (ref 15–41)
Albumin: 2.8 g/dL — ABNORMAL LOW (ref 3.5–5.0)
Albumin: 3 g/dL — ABNORMAL LOW (ref 3.5–5.0)
Alkaline Phosphatase: 72 U/L (ref 38–126)
Alkaline Phosphatase: 75 U/L (ref 38–126)
Anion gap: 6 (ref 5–15)
Anion gap: 11 (ref 5–15)
BUN: 24 mg/dL — ABNORMAL HIGH (ref 8–23)
BUN: 27 mg/dL — ABNORMAL HIGH (ref 8–23)
CO2: 23 mmol/L (ref 22–32)
CO2: 20 mmol/L — ABNORMAL LOW (ref 22–32)
Calcium: 9 mg/dL (ref 8.9–10.3)
Calcium: 9.1 mg/dL (ref 8.9–10.3)
Chloride: 106 mmol/L (ref 98–111)
Chloride: 105 mmol/L (ref 98–111)
Creatinine, Ser: 1.91 mg/dL — ABNORMAL HIGH (ref 0.44–1.00)
Creatinine, Ser: 2.14 mg/dL — ABNORMAL HIGH (ref 0.44–1.00)
GFR, Estimated: 26 mL/min — ABNORMAL LOW (ref >60)
GFR, Estimated: 23 mL/min — ABNORMAL LOW (ref >60)
Glucose, Bld: 104 mg/dL — ABNORMAL HIGH (ref 70–99)
Glucose, Bld: 138 mg/dL — ABNORMAL HIGH (ref 70–99)
Potassium: 4.2 mmol/L (ref 3.5–5.1)
Potassium: 4.4 mmol/L (ref 3.5–5.1)
Sodium: 135 mmol/L (ref 135–145)
Sodium: 136 mmol/L (ref 135–145)
Total Bilirubin: 0.8 mg/dL (ref 0.3–1.2)
Total Bilirubin: 0.7 mg/dL (ref 0.3–1.2)
Total Protein: 6.3 g/dL — ABNORMAL LOW (ref 6.5–8.1)
Total Protein: 7 g/dL (ref 6.5–8.1)

## 2022-03-29 LAB — CBC WITH DIFFERENTIAL/PLATELET
Abs Immature Granulocytes: 0.09 10*3/uL — ABNORMAL HIGH (ref 0.00–0.07)
Abs Immature Granulocytes: 0.1 10*3/uL — ABNORMAL HIGH (ref 0.00–0.07)
Basophils Absolute: 0 10*3/uL (ref 0.0–0.1)
Basophils Absolute: 0 10*3/uL (ref 0.0–0.1)
Basophils Relative: 0 %
Basophils Relative: 0 %
Eosinophils Absolute: 0.5 10*3/uL (ref 0.0–0.5)
Eosinophils Absolute: 0.1 10*3/uL (ref 0.0–0.5)
Eosinophils Relative: 3 %
Eosinophils Relative: 1 %
HCT: 28.9 % — ABNORMAL LOW (ref 36.0–46.0)
HCT: 29.8 % — ABNORMAL LOW (ref 36.0–46.0)
Hemoglobin: 9.5 g/dL — ABNORMAL LOW (ref 12.0–15.0)
Hemoglobin: 9.9 g/dL — ABNORMAL LOW (ref 12.0–15.0)
Immature Granulocytes: 1 %
Immature Granulocytes: 1 %
Lymphocytes Relative: 11 %
Lymphocytes Relative: 4 %
Lymphs Abs: 1.6 10*3/uL (ref 0.7–4.0)
Lymphs Abs: 0.8 10*3/uL (ref 0.7–4.0)
MCH: 29.6 pg (ref 26.0–34.0)
MCH: 30.2 pg (ref 26.0–34.0)
MCHC: 32.9 g/dL (ref 30.0–36.0)
MCHC: 33.2 g/dL (ref 30.0–36.0)
MCV: 90 fL (ref 80.0–100.0)
MCV: 90.9 fL (ref 80.0–100.0)
Monocytes Absolute: 1.1 10*3/uL — ABNORMAL HIGH (ref 0.1–1.0)
Monocytes Absolute: 0.6 10*3/uL (ref 0.1–1.0)
Monocytes Relative: 7 %
Monocytes Relative: 3 %
Neutro Abs: 12.2 10*3/uL — ABNORMAL HIGH (ref 1.7–7.7)
Neutro Abs: 18.4 10*3/uL — ABNORMAL HIGH (ref 1.7–7.7)
Neutrophils Relative %: 78 %
Neutrophils Relative %: 91 %
Platelets: 400 10*3/uL (ref 150–400)
Platelets: 399 10*3/uL (ref 150–400)
RBC: 3.21 MIL/uL — ABNORMAL LOW (ref 3.87–5.11)
RBC: 3.28 MIL/uL — ABNORMAL LOW (ref 3.87–5.11)
RDW: 13.4 % (ref 11.5–15.5)
RDW: 13.4 % (ref 11.5–15.5)
WBC: 15.6 10*3/uL — ABNORMAL HIGH (ref 4.0–10.5)
WBC: 20.1 10*3/uL — ABNORMAL HIGH (ref 4.0–10.5)
nRBC: 0 % (ref 0.0–0.2)
nRBC: 0 % (ref 0.0–0.2)

## 2022-03-29 LAB — AEROBIC/ANAEROBIC CULTURE W GRAM STAIN (SURGICAL/DEEP WOUND)

## 2022-03-29 MED ORDER — DOCUSATE SODIUM 100 MG PO CAPS
100.0000 mg | ORAL_CAPSULE | Freq: Every day | ORAL | Status: DC
Start: 2022-03-30 — End: 2022-03-29

## 2022-03-29 MED ORDER — ACETAMINOPHEN 325 MG PO TABS: 325.0000 mg | ORAL_TABLET | ORAL | Status: DC | PRN

## 2022-03-29 MED ORDER — ACETAMINOPHEN 325 MG PO TABS
325.0000 mg | ORAL_TABLET | ORAL | Status: DC | PRN
  Administered 2022-03-29 – 2022-03-31 (×2): 650 mg via ORAL
  Filled 2022-03-29 (×2): qty 2

## 2022-03-29 MED ORDER — SODIUM CHLORIDE 0.9 % IV SOLN
2.0000 g | INTRAVENOUS | Status: DC
  Administered 2022-03-29: 2 g via INTRAVENOUS
  Filled 2022-03-29 (×2): qty 20

## 2022-03-29 MED ORDER — AMLODIPINE BESYLATE 10 MG PO TABS
10.0000 mg | ORAL_TABLET | Freq: Every day | ORAL | Status: DC
  Administered 2022-03-31: 10 mg via ORAL
  Filled 2022-03-29: qty 1

## 2022-03-29 MED ORDER — ASPIRIN 81 MG PO CHEW
81.0000 mg | CHEWABLE_TABLET | Freq: Every day | ORAL | Status: DC
  Administered 2022-03-31 – 2022-04-05 (×6): 81 mg via ORAL
  Filled 2022-03-29 (×6): qty 1

## 2022-03-29 MED ORDER — CEPHALEXIN 250 MG PO CAPS
500.0000 mg | ORAL_CAPSULE | Freq: Two times a day (BID) | ORAL | Status: DC
Start: 2022-03-29 — End: 2022-03-29
  Administered 2022-03-29: 500 mg via ORAL
  Filled 2022-03-29: qty 2

## 2022-03-29 MED ORDER — AMLODIPINE BESYLATE 10 MG PO TABS
10.0000 mg | ORAL_TABLET | Freq: Every day | ORAL | Status: DC
Start: 2022-03-30 — End: 2022-04-15

## 2022-03-29 MED ORDER — METRONIDAZOLE 500 MG/100ML IV SOLN
500.0000 mg | Freq: Two times a day (BID) | INTRAVENOUS | Status: DC
  Administered 2022-03-29 – 2022-03-30 (×2): 500 mg via INTRAVENOUS
  Filled 2022-03-29 (×2): qty 100

## 2022-03-29 MED ORDER — OXYCODONE-ACETAMINOPHEN 5-325 MG PO TABS: 1.0000 | ORAL_TABLET | ORAL | 0 refills | Status: DC | PRN

## 2022-03-29 MED ORDER — FERROUS SULFATE 325 (65 FE) MG PO TABS: 325.0000 mg | ORAL_TABLET | Freq: Every day | ORAL | 3 refills | Status: DC

## 2022-03-29 MED ORDER — FERROUS SULFATE 325 (65 FE) MG PO TABS
325.0000 mg | ORAL_TABLET | Freq: Every day | ORAL | Status: DC
  Administered 2022-03-31 – 2022-04-05 (×6): 325 mg via ORAL
  Filled 2022-03-29 (×6): qty 1

## 2022-03-29 MED ORDER — CLOPIDOGREL BISULFATE 75 MG PO TABS
75.0000 mg | ORAL_TABLET | Freq: Every day | ORAL | Status: DC
  Administered 2022-03-31 – 2022-04-05 (×6): 75 mg via ORAL
  Filled 2022-03-29 (×6): qty 1

## 2022-03-29 MED ORDER — NORTRIPTYLINE HCL 10 MG PO CAPS
10.0000 mg | ORAL_CAPSULE | Freq: Two times a day (BID) | ORAL | Status: DC
  Administered 2022-03-29 – 2022-04-05 (×13): 10 mg via ORAL
  Filled 2022-03-29 (×15): qty 1

## 2022-03-29 MED ORDER — SODIUM CHLORIDE 0.9 % IV SOLN: INTRAVENOUS | Status: DC

## 2022-03-29 MED ORDER — EZETIMIBE 10 MG PO TABS
10.0000 mg | ORAL_TABLET | Freq: Every day | ORAL | Status: DC
  Administered 2022-03-31 – 2022-04-05 (×6): 10 mg via ORAL
  Filled 2022-03-29 (×6): qty 1

## 2022-03-29 MED ORDER — ACETAMINOPHEN 650 MG RE SUPP: 325.0000 mg | RECTAL | Status: DC | PRN

## 2022-03-29 MED ORDER — LABETALOL HCL 200 MG PO TABS
300.0000 mg | ORAL_TABLET | Freq: Two times a day (BID) | ORAL | Status: DC
Start: 2022-03-29 — End: 2022-04-02
  Administered 2022-03-29 – 2022-04-02 (×7): 300 mg via ORAL
  Filled 2022-03-29 (×7): qty 2

## 2022-03-29 MED ORDER — OXYCODONE-ACETAMINOPHEN 5-325 MG PO TABS
1.0000 | ORAL_TABLET | ORAL | Status: DC | PRN
  Administered 2022-03-29: 1 via ORAL
  Administered 2022-03-30: 2 via ORAL
  Administered 2022-03-30: 1 via ORAL
  Administered 2022-03-30 – 2022-04-03 (×12): 2 via ORAL
  Administered 2022-04-03: 1 via ORAL
  Administered 2022-04-04: 2 via ORAL
  Administered 2022-04-05: 1 via ORAL
  Filled 2022-03-29 (×6): qty 2
  Filled 2022-03-29: qty 1
  Filled 2022-03-29 (×3): qty 2
  Filled 2022-03-29: qty 1
  Filled 2022-03-29 (×8): qty 2

## 2022-03-29 MED ORDER — DOCUSATE SODIUM 100 MG PO CAPS: 100.0000 mg | ORAL_CAPSULE | Freq: Every day | ORAL | 0 refills | Status: DC

## 2022-03-29 MED ORDER — ALUM & MAG HYDROXIDE-SIMETH 200-200-20 MG/5ML PO SUSP: 15.0000 mL | ORAL | Status: DC | PRN

## 2022-03-29 MED ORDER — RANOLAZINE ER 500 MG PO TB12
500.0000 mg | ORAL_TABLET | Freq: Two times a day (BID) | ORAL | Status: DC
  Administered 2022-03-29 – 2022-04-05 (×13): 500 mg via ORAL
  Filled 2022-03-29 (×14): qty 1

## 2022-03-29 MED ORDER — AMLODIPINE BESYLATE 10 MG PO TABS: 10.0000 mg | ORAL_TABLET | Freq: Every day | ORAL | Status: DC

## 2022-03-29 NOTE — Progress Notes (Signed)
Pt returned from radiology.

## 2022-03-29 NOTE — Progress Notes (Signed)
Occupational Therapy Note  Patient Details  Name: Sabrina [name] MRN: [id number] Date of Birth: 01-Jul-1941  Occupational Therapy Discharge Note  This patient was unable to complete the inpatient rehab program due to d/c to acute for surgery; therefore did not meet their long term goals. Pt left the program at a min assist level for their  functional ADLs. Education with pt and daughter had not been completed.This patient is being discharged from OT services at this time.  BIMS at time of d/c  Pt unable to complete due to medical status  See CareTool for functional status details.  If the patient is able to return to inpatient rehabilitation within 3 midnights, this may be considered an interrupted stay and therapy services will resume as ordered. Modification and reinstatement of their goals will be made upon completion of therapy service reevaluations.       Leotis Shames [facility] 03/29/2022, 12:21 PM

## 2022-03-29 NOTE — Progress Notes (Signed)
PROGRESS NOTE   Subjective/Complaints:  Pt reports malaise still.  Doesn't feel right- WBC is 15.6- up from 8k-  Also says someone took a picture from end of her bed when she woke up this AM- pt usually good historian- will look into this.   Also R groin/lower abdomen feels heavy and hard/very painful to move- and has to strain to pul up in bed which hurts really bad.  Taking percocet for pain.  Also c/o vomiting yesterday- "first time in 50 years has vomited".  Said right after took AM meds- did note hadn't eaten before AM meds yesterday and took percocet at that time?  No nausea/vomiting today.   Cr up to 1.91 today- and BUN about stable at 24.  Also has a left shift.    ROS:   Pt denies SOB, (+) groin/abd pain, CP, N/V/C/D, and vision changes    Objective:   No results found. Recent Labs    03/29/22 0616  WBC 15.6*  HGB 9.5*  HCT 28.9*  PLT 400    Recent Labs    03/27/22 0614 03/29/22 0616  NA 137 135  K 4.3 4.2  CL 104 106  CO2 26 23  GLUCOSE 103* 104*  BUN 26* 24*  CREATININE 1.89* 1.91*  CALCIUM 9.4 9.0    Intake/Output Summary (Last 24 hours) at 03/29/2022 0838 Last data filed at 03/28/2022 1811 Gross per 24 hour  Intake 360 ml  Output --  Net 360 ml        Physical Exam: Vital Signs Blood pressure (!) 174/58, pulse 73, temperature 98.4 F (36.9 C), temperature source Oral, resp. rate 16, height 5\' 1"  (1.549 m), weight 68.8 kg, SpO2 99 %.    General: awake, alert, appropriate, laying in bed supine; NAD HENT: conjugate gaze; oropharynx moist CV: regular rate; no JVD Pulmonary: CTA B/L; no W/R/R- good air movement GI: soft, NT, ND, (+)BS- R groin dressing and  Psychiatric: appropriate- more depressed affect/flat today Neurological: Ox3- appears more tired/(+) malaise  Skin:    Comments: Bilateral femoral endarterectomy sites with some oozing of right groin. Still  oozing- appears  to be more yellowish drainage than last week when more clear- also more Tender in R groin/pannus- dressing saturated- like was last week     Comments: Patient is alert.  Makes eye contact with examiner.  Follows simple commands.  Provides name and age but could not recall full hospital course. Significant lower extremity weakness R>L. 3/5 strength left lower extremity, 2/5 right lower extremity     Assessment/Plan: 1. Functional deficits which require 3+ hours per day of interdisciplinary therapy in a comprehensive inpatient rehab setting. Physiatrist is providing close team supervision and 24 hour management of active medical problems listed below. Physiatrist and rehab team continue to assess barriers to discharge/monitor patient progress toward functional and medical goals  Care Tool:  Bathing    Body parts bathed by patient: Right arm, Left arm, Chest, Abdomen, Front perineal area, Buttocks, Right upper leg, Left upper leg, Face   Body parts bathed by helper: Right lower leg, Left lower leg     Bathing assist Assist Level: Minimal Assistance - Patient >  75%     Upper Body Dressing/Undressing Upper body dressing   What is the patient wearing?: Hospital gown only    Upper body assist Assist Level: Minimal Assistance - Patient > 75%    Lower Body Dressing/Undressing Lower body dressing      What is the patient wearing?: Pants     Lower body assist Assist for lower body dressing: Minimal Assistance - Patient > 75%     Toileting Toileting    Toileting assist Assist for toileting: Set up assist     Transfers Chair/bed transfer  Transfers assist     Chair/bed transfer assist level: Moderate Assistance - Patient 50 - 74%     Locomotion Ambulation   Ambulation assist      Assist level: Moderate Assistance - Patient 50 - 74% Assistive device: Hand held assist Max distance: 120'   Walk 10 feet activity   Assist     Assist level: Moderate Assistance -  Patient - 50 - 74% Assistive device: Hand held assist   Walk 50 feet activity   Assist    Assist level: Moderate Assistance - Patient - 50 - 74% Assistive device: Hand held assist    Walk 150 feet activity   Assist Walk 150 feet activity did not occur: Safety/medical concerns         Walk 10 feet on uneven surface  activity   Assist Walk 10 feet on uneven surfaces activity did not occur: Safety/medical concerns         Wheelchair     Assist Is the patient using a wheelchair?: No             Wheelchair 50 feet with 2 turns activity    Assist            Wheelchair 150 feet activity     Assist          Blood pressure (!) 174/58, pulse 73, temperature 98.4 F (36.9 C), temperature source Oral, resp. rate 16, height 5\' 1"  (1.549 m), weight 68.8 kg, SpO2 99 %.  Medical Problem List and Plan: 1. Functional deficits secondary to debility secondary to peripheral vascular disease with debilitating claudication.  Status post bilateral endarterectomies with stent placement 03/17/2022 per Dr. Leotis Pain             -patient may shower             -ELOS/Goals: 5-7 days S          Con't CIR- PT and OT 2.  Antithrombotics: -DVT/anticoagulation:  Mechanical: Antiembolism stockings, thigh (TED hose) Bilateral lower extremities             -antiplatelet therapy: Aspirin 81 mg daily and Plavix 75 mg daily 3. Pain Management:/Chronic hip pain L rib fx from CPR: continue oxycodone as needed  6/1- having Rib pain from fracture under L breast- suggest asking for pain meds prior to therapy- let nurse know  6/5- still having a lot of pain- actually more in R groin- will con't Oxy prn 4. Mood: Provided emotional support             -antipsychotic agents: N/A 5. Neuropsych: This patient is capable of making decisions on her own behalf. 6. Groin incision: slight opening, no drainage, continue to monitor daily.  7. Fluids/Electrolytes/Nutrition: Routine in and outs  with follow-up chemistries 8.  Postoperative cardiopulmonary arrest/non-STEMI with history of CAD/CABG.  Patient did receive CPR x2 minutes.  Follow-up cardiology services.  Continue Ranexa 500  mg twice daily.  Plan outpatient cardiac catheterization 9.  Acute on chronic anemia.  Continue iron supplement.  Follow-up CBC  6/1- Up slightly, however is also having renal changes, so could be form that.  10.  CKD stage III.  Baseline creatinine 1.42-1.56.  Follow-up chemistries  6/3: placed nursing order to encourage 6-8 glasses of water per day given rise in creatinine.  11.  Diastolic congestive heart failure.  Monitor for any signs of fluid overload 6/1- will order daily weights 6/3: weights down compared to last week, continue to monitor.  6/5- Weight stable- however appears more dry- off  12.  Hypertension.  Aldactone 25 mg daily, Avapro 300 mg daily, labetalol 300 mg twice daily, HCTZ 12.5 mg daily.  Monitor with increased mobility  6/2- stopped HCTZ due to renal issues- BP was great until this AM- will monitor and see what might need to be changed- monitor trend  6/3: slightly elevated this morning but will not restart HCTZ given elevated creatinine.  6/5- BP 170s/80s this AM- off HCTZ_ spoke with pharmacy- [facility]- will add Norvasc 10 mg daily and stop Irbesartan due to elevated BP in setting of AKI.  13.  Hyperlipidemia.  Zetia 14.  Obesity.  BMI 30.53.  Dietary follow-up  6/2- BMI 28.66 now- will monitor- large change.   15. Acute renal insuffiencey in setting of DCHF and CKD3-  6/1- Cr up to 1.89 from 1.50- BUN 25- will give 1 L IVFs 50cc/hour so don't overload and check daily weights and monitor closely for SOB.    6/2- Cr down to 1.77- still was receiving IVFs this AM, was blocked line o/n that took time to fix per nursing- so will recheck BMP in Am and determine if needs more IVFs- push fluids as well- order placed  6/5- Cr back up to 1.91- will stop Irbesartan, give 1 L IVFs and  recheck in AM- on Norvasc now.  16. Constipation-   6/1- last BM 2 days ago- wants milk of mg- however with renal issues, will do sorbitol- will order after therapy.   6/2- LBM overnight- pt said was cleaned out- con't regimen  6/4: add colace  6/5- last BM 6/4-  17. Leukocytosis in setting of increased pain/drainage from R groin  6/5- called vascular to assess- concern with increased pain/drainage  I spent a total of 56   minutes on total care today- >50% coordination of care- due to d/w vascular, cardiology and pharmacy as well as Rehab PA.    LOS: 5 days A FACE TO FACE EVALUATION WAS PERFORMED  Deray Dawes 03/29/2022, 8:38 AM

## 2022-03-29 NOTE — Progress Notes (Signed)
Patient discharged from Inpatient Rehab and admitted to [facility]   Yehuda Mao, LPN

## 2022-03-29 NOTE — Discharge Summary (Signed)
Physician Discharge Summary  Patient ID: Sabrina [name] MRN: [id number] DOB/AGE: 1941-03-03 81 y.o.  Admit date: 03/24/2022 Discharge date: 03/29/2022  Discharge Diagnoses:  Principal Problem:   Debility DVT prophylaxis Postoperative non-STEMI with history of CAD/CABG CKD stage III Diastolic congestive heart failure Hypertension Obesity Hyperlipidemia  Discharged Condition: Guarded  Significant Diagnostic Studies: MR Angiogram Head Wo Contrast  Result Date: 03/03/2022 CLINICAL DATA:  Follow-up examination for suspected ICA aneurysm. EXAM: MRA HEAD WITHOUT CONTRAST TECHNIQUE: Angiographic images of the Circle of Willis were acquired using MRA technique without intravenous contrast. COMPARISON:  Comparison made with recent brain MRI from 02/18/2022. FINDINGS: Anterior circulation: Visualized cervical left ICA widely patent with antegrade flow. Horizontal petrous left ICA widely patent as well. Atheromatous irregularity throughout the left carotid siphon without high-grade stenosis. 2.5 mm saccular outpouching arising from the cavernous left ICA consistent with a small aneurysm (series 5, image 98). This is directed medially. Additional lobulated irregular aneurysm measuring up to 3.8 mm extending posteriorly from the supraclinoid left ICA consistent with a left posterior communicating artery aneurysm (series 5, image 119). This accounts for the abnormality seen on prior brain MRI. The cervical right ICA is largely occluded with only intermittent and severely attenuated flow seen through the petrous segment. Distal reconstitution at the supraclinoid right ICA via collateral flow across the circle-of-Willis and/or the of the left posterior communicating artery. A1 segments patent. Right A1 hypoplastic. Normal anterior communicating artery complex. Both ACAs patent to their distal aspects without stenosis. Both M1 segments patent without stenosis. Normal right MCA bifurcation. There is a 2.4 mm  saccular aneurysm arising from the left MCA bifurcation (series 5, image 143). This is directed medially. Distal MCA branches well perfused bilaterally. Posterior circulation: Right vertebral artery dominant and patent to the vertebrobasilar junction without stenosis. Right PICA grossly patent at its origin. Left vertebral artery widely patent as it courses into the cranial vault. Left PICA patent. There is a focal severe stenosis involving the left V4 segment beyond the takeoff of the left PICA (series 5, image 28). Left V4 segment otherwise remains patent to the vertebrobasilar junction. Basilar patent to its distal aspect without stenosis. Anterior inferior cerebellar arteries patent. Superior cerebellar arteries patent bilaterally. Both PCA supplied via the basilar as well as bilateral posterior communicating arteries. Focal mild stenosis noted at the origin of the left P1 segment. PCAs otherwise well perfused to their distal aspects. Anatomic variants: Hypoplastic right A1 segment. Dominant right vertebral artery. Other: None. IMPRESSION: 1. 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 2. 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 3. 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 4. 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 5. Severe left V4 stenosis just beyond the takeoff of the left PICA. Dominant right vertebral artery widely patent. Electronically Signed   By: Jeannine Boga M.D.   On: 03/03/2022 06:34   DG C-Arm 1-60 Min-No Report  Result Date: 03/17/2022 Fluoroscopy was utilized by the requesting physician.  No radiographic interpretation.   ECHOCARDIOGRAM COMPLETE  Result Date: 03/18/2022    ECHOCARDIOGRAM REPORT   Patient Name:   Sabrina [name] Date of Exam: 03/18/2022 Medical Rec #:  [id number]          Height:       61.0 in Accession #:    [id number]         Weight:       161.6 lb Date of  Birth:  [birth year]          BSA:          1.725 m Patient Age:    14 years           BP:           133/64 mmHg Patient Gender: F                  HR:           73 bpm. Exam Location:  [facility] Procedure: 2D Echo, Cardiac Doppler and Color Doppler Indications:     Cardiac arrest I46.9  History:         Patient has no prior history of Echocardiogram examinations.                  CHF, CAD, Prior CABG; Risk Factors:Hypertension.  Sonographer:     Sherrie Sport Referring Phys:  [id number] Flora Lipps Diagnosing Phys: Donnelly Angelica  Sonographer Comments: Technically challenging study due to limited acoustic windows, no apical window and no subcostal window. IMPRESSIONS  1. Left ventricular ejection fraction, by estimation, is 60 to 65%. The left ventricle has normal function. The left ventricle has no regional wall motion abnormalities. There is moderate left ventricular hypertrophy. Left ventricular diastolic function  could not be evaluated.  2. The mitral valve is abnormal. Trivial mitral valve regurgitation. Moderate mitral annular calcification.  3. The aortic valve is calcified. Aortic valve regurgitation is not visualized. Conclusion(s)/Recommendation(s): Very limited study. Normal LV function. Unable to assess for mitral stensos which previously was moderate to severe. Unable to assess for AS, and no TR velocity obtained to assess RVSP. FINDINGS  Left Ventricle: Left ventricular ejection fraction, by estimation, is 60 to 65%. The left ventricle has normal function. The left ventricle has no regional wall motion abnormalities. The left ventricular internal cavity size was normal in size. There is  moderate left ventricular hypertrophy. Left ventricular diastolic function could not be evaluated. Pericardium: There is no evidence of pericardial effusion. Mitral Valve: The mitral valve is abnormal. Moderate mitral annular calcification. Trivial mitral valve regurgitation. Tricuspid Valve: The tricuspid valve is normal in  structure. Tricuspid valve regurgitation is mild. Aortic Valve: The aortic valve is calcified. Aortic valve regurgitation is not visualized. Pulmonic Valve: The pulmonic valve was not well visualized. Pulmonic valve regurgitation is not visualized. No evidence of pulmonic stenosis. Aorta: The aortic root is normal in size and structure.  LEFT VENTRICLE PLAX 2D LVIDd:         3.80 cm LVIDs:         2.10 cm LV PW:         1.30 cm LV IVS:        1.30 cm LVOT diam:     2.00 cm LVOT Area:     3.14 cm  LEFT ATRIUM         Index LA diam:    3.90 cm 2.26 cm/m                        PULMONIC VALVE AORTA                 PV Vmax:        0.85 m/s Ao Root diam: 2.90 cm PV Vmean:       48.900 cm/s                       PV  VTI:         0.124 m                       PV Peak grad:   2.9 mmHg                       PV Mean grad:   1.0 mmHg                       RVOT Peak grad: 6 mmHg   SHUNTS Systemic Diam: 2.00 cm Pulmonic VTI:  0.241 m Donnelly Angelica Electronically signed by Donnelly Angelica Signature Date/Time: 03/18/2022/10:44:10 AM    Final     Labs:  Basic Metabolic Panel: Recent Labs  Lab 03/23/22 0602 03/24/22 0503 03/25/22 0524 03/26/22 0540 03/27/22 0614 03/29/22 0616  NA 138  --  135 134* 137 135  K 4.4  --  4.5 4.4 4.3 4.2  CL 105  --  101 103 104 106  CO2 26  --  27 26 26 23   GLUCOSE 102*  --  104* 105* 103* 104*  BUN 23  --  25* 24* 26* 24*  CREATININE 1.43* 1.59* 1.89* 1.77* 1.89* 1.91*  CALCIUM 8.9  --  9.4 9.1 9.4 9.0    CBC: Recent Labs  Lab 03/23/22 0602 03/25/22 0524 03/29/22 0616  WBC 7.2 8.1 15.6*  NEUTROABS  --  4.7 12.2*  HGB 8.3* 9.0* 9.5*  HCT 25.6* 27.7* 28.9*  MCV 89.2 90.2 90.0  PLT 235 327 400    CBG: Recent Labs  Lab 03/26/22 0641  GLUCAP 102*    Brief HPI:   Sabrina [name] is a 81 y.o. right-handed female with history of obesity chronic hip pain CAD with CABG x3, diastolic congestive heart failure, CKD stage III hypertension hyperlipidemia carotid artery angioplasty  2004 and peripheral vascular disease with multiple revascularization procedures.  Per chart review lives alone.  Presented to [name] 03/17/2022 for elective bilateral superior femoral artery and iliac stenting with vascular surgery due to peripheral vascular disease and debilitating claudication with some mild rest pain.  Underwent left common femoral profunda femoris and superficial femoral artery enterectomy's as well as right common femoral profunda femoris and superficial femoral artery enterectomy stent placement to left external iliac artery with 8 mm diameter by 4 cm length of life star stent 03/17/2022 per Dr. Leotis Pain.  Postoperative acute respiratory failure cardiac arrest requiring CPR x2 minutes.  She did have markedly elevated troponin of 15,000.  Required intubation for airway protection.  Echocardiogram with ejection fraction of 60 to 65% no wall motion abnormalities.  Maintained on Plavix and aspirin for at least 12 months and plan to discuss outpatient cardiac catheterization.  Ranexa ongoing at 500 mg twice daily per cardiology services Dr. Sharlene Dory.  Ongoing wound care bilateral groin dressings with wound VAC removed 03/22/2022.  Acute on chronic anemia 8.3 she was transfused 1 unit packed red blood cells 5/27.  Therapy evaluations completed due to patient decreased functional mobility was admitted for a comprehensive rehab program.   [name] Course: Sabrina [name] was admitted to rehab 03/24/2022 for inpatient therapies to consist of PT, ST and OT at least three hours five days a week. Past admission physiatrist, therapy team and rehab RN have worked together to provide customized collaborative inpatient rehab.  Pertaining to patient's peripheral vascular disease debility status post bilateral endarterectomies with stent placement 03/17/2022 per vascular  surgery Dr. Leotis Pain.  She continued to have some increased drainage from the groin site vascular surgery did follow-up while at [name] recommendations were made to return back to the acute care side for possible intervention ongoing work-up.  She remained on aspirin and Plavix therapy.  No chest pain or shortness of breath noted postoperative respiratory failure non-STEMI CPR x2 minutes she did continue on Ranexa.  Acute on chronic anemia stable iron supplement as indicated.  CKD stage III baseline creatinine 1.42-1.56 with follow-up chemistries.  She exhibited no signs of fluid overload blood pressures were soft and controlled.  Her HCTZ was held due to renal insufficiency.  She did require IV fluids for a short time to maintain hydration.  Steady ongoing for hyperlipidemia.  Noted obesity BMI 30.53 with dietary follow-up.  Bouts of constipation resolved with laxative assistance.   Blood pressures were monitored on TID basis and soft and monitored     Rehab course: During patient's stay in rehab weekly team conferences were held to monitor patient's progress, set goals and discuss barriers to discharge. At admission, patient required minimal assist 200 feet rolling walker minimal assist sit to stand  Physical exam.  Blood pressure 136/64 pulse 78 temperature 98.5 respirations 18 oxygen saturation 100% room air Constitutional.  No acute distress HEENT Head.  Normocephalic and atraumatic Eyes.  Pupils round and reactive to light no discharge without nystagmus Neck.  Supple nontender no JVD without thyromegaly Cardiac regular rate and rhythm without any extra sounds or murmur heard Abdomen.  Soft nontender positive bowel sounds Respiratory effort normal no respiratory distress without wheeze Skin.  Bilateral femoral enterectomy site with some oozing of right groin Neurologic.  Alert and oriented x3    He/She  has had improvement in activity tolerance, balance, postural control as well as ability to compensate for deficits. He/She has had improvement in functional use RUE/LUE  and RLE/LLE as well as improvement in  awareness.  Patient ambulating contact-guard assist.  Supine to sit edge of bed with minimal assist.  Ambulates to the bathroom to use the toilet continent of bowel and bladder contact-guard for toileting tasks.  Stand pivot transfers with contact-guard.  Ongoing family teaching.       Disposition: Discharged to acute care services   Diet: Regular  Special Instructions: No smoking or alcohol  Medications at discharge 1.  Tylenol as needed 2.  Norvasc 10 mg p.o. daily 3.  Aspirin 81 mg p.o. daily 4.  Plavix 75 mg p.o. daily 5.  Colace 100 mg p.o. daily 6.  Zetia 10 mg p.o. daily 7.  Ferrous sulfate 325 mg p.o. daily 8.  Labetalol 300 mg p.o. twice daily 9.  Pamelor 10 mg p.o. twice daily 10.  Oxycodone 1 to 2 tablets every 4 hours as needed pain 11.  Ranexa 500 mg p.o. twice daily 12.  Aldactone 25 mg p.o. daily  30-35 minutes were spent completing discharge summary and discharge planning     Follow-up Information     Lovorn, Jinny Blossom, MD Follow up.   Specialty: Physical Medicine and Rehabilitation Why: No formal follow-up needed Contact information: [address] [place] [address] [phone]         Algernon Huxley, MD Follow up.   Specialties: Vascular Surgery, Radiology, Interventional Cardiology Why: Call for appointment Contact information: [place] [address] [phone]         Yolonda Kida, MD Follow up.  Specialties: Cardiology, Internal Medicine Why: Call for appointment Contact information: [place] [address] [phone]                 Signed: Cathlyn Parsons 03/29/2022, 11:29 AM

## 2022-03-29 NOTE — Progress Notes (Signed)
Physical Therapy Session Note  Patient Details  Name: Sabrina [name] MRN: [id number] Date of Birth: 09-Dec-1940  Today's Date: 03/29/2022 PT Individual Time: 0800-0910 PT Individual Time Calculation (min): 70 min   Short Term Goals: Week 1:  PT Short Term Goal 1 (Week 1): =LTG due to ELOS  Skilled Therapeutic Interventions/Progress Updates:  Pt received seated in bed with nursing in room to change dressing to B groin incisions. Pt reports pain in her hips and groin with mobility, able to receive pain medication at beginning of therapy session. Supine to sit with min A for some trunk control, increased time needed for BLE management. Sit to stand with CGA to RW, stand pivot transfer to w/c with RW and CGA. Pt is setup A for bathing at sink level. Sit to stand with CGA to sink and close Supervision for standing balance while washing and drying periarea. Assisted pt with donning new mesh underwear, pants, and hospital gown. Ambulation 2 x 150 ft with RW and close Supervision to CGA for balance, antalgic gait pattern. Pt requests to return to bed at end of session. Once standing EOB pt reports urgent urge to urinate as well as onset of nausea. Stand pivot transfer to [facility] due to urgency with RW and CGA. Pt able to continently void urine and bowel in [facility], see Flowsheet for details. Pt is dependent for pericare due to ongoing nausea and for time conservation. Stand pivot transfer back to bed with RW and CGA. Sit to supine mod A needed for BLE management. Once seated in bed pt reports urge to toilet again. Nursing notified of pt's request due to this therapy session already going over scheduled time, pt agreeable. Pt left seated in bed with needs in reach, bed alarm in place.  Therapy Documentation Precautions:  Precautions Precautions: Fall Precaution Comments: rib fx from CPR; pain in LEs Restrictions Weight Bearing Restrictions: No       Therapy/Group: Individual Therapy   Excell Seltzer,  PT, DPT, CSRS 03/29/2022, 12:11 PM

## 2022-03-29 NOTE — H&P (Signed)
History and Physical    Patient: Sabrina [name] KZS:[id number] DOB: 1941-08-25 DOA: (Not on file) DOS: the patient was seen and examined on 03/29/2022 PCP: Crecencio Mc, MD  Patient coming from: Transfer from inpatient rehab  Chief Complaint: Drainage from wound  HPI: Sabrina [name] is a 81 y.o. female with medical history significant of HTN, HLD, CAD s/p CABG x3, diastolic CHF, CKD stage III, and PAD s/p multiple revascularization procedures.  She was transferred to inpatient rehab after being hospitalized 5/24-5/31 at [facility] for elective bilateral superior femoral artery and left iliac stenting with vascular surgery due to peripheral vascular disease and debilitating claudication with some mild rest pain.  Patient underwent left common femoral, profunda femoris, and superficial femoral artery endarterectomies as well as right common femoral profunda femoris and superficial femoral artery enterectomy's stent placement to left external iliac artery with 8 mm diameter by 4 cm length of life star stent 03/17/2022 per Dr. Corene Cornea Dewthe procedure with Dr. Lucky Cowboy.  Following the procedure patient developed acute respiratory failure with cardiac arrest requiring 2 minutes of CPR prior to return spontaneous circulation.  She was temporarily intubated and high-sensitivity troponins were noted to be elevated up to 15,000.  Echocardiogram noted EF to be 60-65% without wall motion abnormalities.  Patient was continued on aspirin Plavix for least 12 months.  She was evaluated by cardiology and placed on Ranexa 500 mg twice daily.  She had placement of bilateral groaning dressings once wound vac was removed on 5/29.  Appears wound cultures have been obtained on 5/30 that grew out moderate Proteus mirabilis and rare Klebsiella pneumoniae.  It appears that patient was not treated for these findings.  After being transferred to rehab patient reported that she did okay the first day, but thereafter  had just not been feeling well.  Reported having associated symptoms of nausea, vomiting, and diarrhea in addition to continuous drainage from the right inguinal surgical wound.  She had not been placed on antibiotics initial cultures were obtained.  Since being in rehab patient's labs had noted creatinine 1.59->189->1.77->1.89->1.91->2.14 with BUN 27, and white blood cell count 8.1-> 15.6-> 21.1. Blood culture was ordered and vascular surgery was consulted due to concern for infection of the surgical wound. Admission was recommended back on the inpatient side as a result of possible need of surgery and question of ability to continue on with therapies.  Vascular surgery had planned to possibly do bedside I&D due to patient's complications during her prior surgery.   Review of Systems: As mentioned in the history of present illness. All other systems reviewed and are negative. 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 b.) TTE 11/16/2021: EF >55%; mild AS (MPG 9 mmHg)   CAD (coronary artery disease)    a.) s/p 3v CABG 03/29/2000   Carotid artery stenosis    a.) s/p LEFT CEA 09/09/2003. b.) Carotid doppler 73/22/0254: 2-70% LICA, CTO RICA; subclavian stenosis   Cataracts, bilateral    Cervical spondylosis without myelopathy    Chronic diastolic CHF (congestive heart failure), NYHA class 3 (  [place])    a.) TTE 05/27/2016: EF >55%, mild LA enlargement, triv PR, mild MR, mod TR; G3DD. b.) TTE 12/12/2017: EF >55%, mild LVH, BAE, mild MR/PR, mod TR; RVSP 52.8 mmHg. c.) TTE 03/18/2020: EF >55%, BAD, AS (MPG  8.7 mmHg); triv MR, mild TR/PR. d.) TTE 11/16/2021: EF >55%, LVH, G1DD, triv MR, mild PR, mod TR; AS (MPG 9 mmHg); MS (MPG 5 mmHg)   Chronic kidney disease, stage III (moderate) (HCC)    Chronic narcotic use 06/24/2014   Chronic pain syndrome    GERD (gastroesophageal reflux disease)    History of 2019 novel coronavirus disease (COVID-19) 09/22/2021   Hyperlipidemia    Hypertension    Long term current use of antithrombotics/antiplatelets    a.) on daily DAPT therapy (ASA + clopidogrel)   Lumbar stenosis with neurogenic claudication    Mitral stenosis 11/16/2021   a.) TTE 11/16/2021: EF >55%; mod MS (MPG 5 mmHg)   Osteoarthritis of hip    Pulmonary hypertension ([place]) 12/12/2017   a.) TTE 12/12/2017: mild; RVSP 52.8 mmHg   PVD (peripheral vascular disease) (HCC)    Renal artery stenosis (HCC)    S/P CABG x 3 03/29/2000   a.) 3v CABG: LIMA-LAD, SVG-dRCA, SVG-RI   Secondary hyperparathyroidism (HCC)    SOB (shortness of breath)    Subclavian arterial stenosis ([place])    a.) s/p placement of 8.0 x 38 mm Lifestream stent to LEFT subclavian 11/23/2021.   Past Surgical History:  Procedure Laterality Date   ABDOMINAL HYSTERECTOMY  1976   CAROTID ARTERY ANGIOPLASTY Left    CAROTID ENDARTERECTOMY Left 09/09/2003   Procedure: CAROTID ENDARTERECTOMY; Location: [facility]; Surgeon: Maura Crandall, MD   COLONOSCOPY WITH PROPOFOL N/A 08/16/2017   Procedure: COLONOSCOPY WITH PROPOFOL;  Surgeon: [name], MD;  Location: [facility] ENDOSCOPY;  Service: Endoscopy;  Laterality: N/A;   CORONARY ANGIOPLASTY WITH STENT PLACEMENT  2000   CORONARY ARTERY BYPASS GRAFT N/A 03/29/2000   Procedure: 3v CORONARY ARTERY BYPASS GRAFT; Location: [facility]   CYSTOSCOPY WITH STENT PLACEMENT Bilateral    ENDARTERECTOMY FEMORAL Bilateral 03/17/2022   Procedure: ENDARTERECTOMY FEMORAL ( BILATERAL SFA STENT);  Surgeon: Katha Cabal, MD;  Location: [facility] ORS;  Service: Vascular;  Laterality: Bilateral;    ESOPHAGOGASTRODUODENOSCOPY (EGD) WITH PROPOFOL N/A 08/16/2017   Procedure: ESOPHAGOGASTRODUODENOSCOPY (EGD) WITH PROPOFOL;  Surgeon: [name], MD;  Location: [facility] ENDOSCOPY;  Service: Endoscopy;  Laterality: N/A;   ESOPHAGOGASTRODUODENOSCOPY (EGD) WITH PROPOFOL N/A 06/29/2018   Procedure: ESOPHAGOGASTRODUODENOSCOPY (EGD) WITH PROPOFOL;  Surgeon: Virgel Manifold, MD;  Location: [facility] ENDOSCOPY;  Service: Endoscopy;  Laterality: N/A;   INSERTION OF ILIAC STENT Left 03/17/2022   Procedure: INSERTION OF ILIAC STENT;  Surgeon: Katha Cabal, MD;  Location: [facility] ORS;  Service: Vascular;  Laterality: Left;   LAPAROSCOPIC CHOLECYSTECTOMY Left 10/26/1999   Procedure: LAPAROSCOPIC CHOLECYSTECTOMY; Location: [facility]; Surgeon: Rochel Brome, MD   LEFT HEART CATH AND CORONARY ANGIOGRAPHY N/A 11/18/2021   Procedure: LEFT HEART CATH AND CORONARY ANGIOGRAPHY;  Surgeon: Yolonda Kida, MD;  Location: [place] CV [facility];  Service: Cardiovascular;  Laterality: N/A;   LEFT HEART CATH AND CORS/GRAFTS ANGIOGRAPHY Left 11/19/2002   Procedure: LEFT HEART CATH AND CORS/GRAFTS ANGIOGRAPHY; Location: [place]; Surgeon: Katrine Coho, MD   LEFT HEART CATH AND CORS/GRAFTS ANGIOGRAPHY Left 09/17/2003   Procedure: LEFT HEART CATH AND CORS/GRAFTS ANGIOGRAPHY; Location: [place]; Surgeon: Katrine Coho, MD   LOWER EXTREMITY ANGIOGRAPHY Right 01/06/2022   Procedure: Lower Extremity Angiography;  Surgeon: Katha Cabal, MD;  Location: [facility]  INVASIVE CV LAB;  Service: Cardiovascular;  Laterality: Right;   RENAL ARTERY ANGIOPLASTY Bilateral 12/2013   TOE AMPUTATION Right    small toe   TONSILLECTOMY AND ADENOIDECTOMY     TOTAL HIP ARTHROPLASTY Left 2005   TOTAL HIP ARTHROPLASTY Right 2015   UPPER EXTREMITY ANGIOGRAPHY Left 11/23/2021   Procedure: UPPER EXTREMITY ANGIOGRAPHY;  Surgeon: Algernon Huxley, MD;  Location: [place] [facility];  Service: Cardiovascular;  Laterality: Left;   Social History:  reports that she  quit smoking about 23 years ago. Her smoking use included cigarettes. She has never used smokeless tobacco. She reports that she does not drink alcohol and does not use drugs.  Allergies  Allergen Reactions   Citalopram Anaphylaxis    Throat closing    Dilaudid [Hydromorphone Hcl] Nausea And Vomiting   Hydrochlorothiazide Other (See Comments)    Decreased GFR (Nov 2015)   Liothyronine     Hair fell out, caused headaches    Nsaids     CKD stage III - avoid nephrotoxic drugs   Nubain [Nalbuphine Hcl]     Burning sensation in back   Penicillins Itching   Prasugrel Itching   Statins Itching    Family History  Problem Relation Age of Onset   Stroke Mother    Hypertension Mother    Diabetes Mother    Hypertension Father    Heart disease Sister        MI   Multiple sclerosis Daughter    Multiple sclerosis Son    Cerebral aneurysm Son    Seizures Son    Cerebral aneurysm Son    Breast cancer Paternal Aunt 80    Prior to Admission medications   Medication Sig Start Date End Date Taking? Authorizing Provider  acetaminophen (TYLENOL) 325 MG tablet Take 1-2 tablets (325-650 mg total) by mouth every 4 (four) hours as needed for mild pain (or temp >/= 101 F). 03/29/22   Angiulli, Lavon Paganini, PA-C  amLODipine (NORVASC) 10 MG tablet Take 1 tablet (10 mg total) by mouth daily. 03/30/22   Angiulli, Lavon Paganini, PA-C  Ascorbic Acid (VITAMIN C) 500 MG CAPS Take 500 mg by mouth daily. 03/09/22   Earlie Server, MD  aspirin 81 MG tablet Take 81 mg by mouth daily.    [provider]  butalbital-acetaminophen-caffeine (FIORICET) 50-325-40 MG tablet Take 1 tablet by mouth 2 (two) times daily as needed for headache.    [provider]  clopidogrel (PLAVIX) 75 MG tablet Take 1 tablet (75 mg total) by mouth daily. To prevent strokes 01/04/22   Crecencio Mc, MD  docusate sodium (COLACE) 100 MG capsule Take 1 capsule (100 mg total) by mouth daily as needed for mild constipation or moderate  constipation. Patient not taking: Reported on 03/24/2022 03/09/22   Earlie Server, MD  docusate sodium (COLACE) 100 MG capsule Take 1 capsule (100 mg total) by mouth daily. 03/30/22   Angiulli, Lavon Paganini, PA-C  ezetimibe (ZETIA) 10 MG tablet Take 1 tablet (10 mg total) by mouth daily. 01/11/22   Crecencio Mc, MD  ferrous sulfate 325 (65 FE) MG EC tablet Take 1 tablet (325 mg total) by mouth 2 (two) times daily with a meal. 03/09/22   Earlie Server, MD  ferrous sulfate 325 (65 FE) MG tablet Take 1 tablet (325 mg total) by mouth daily with breakfast. 03/30/22   Angiulli, Lavon Paganini, PA-C  labetalol (NORMODYNE) 300 MG tablet Take 1 tablet (300 mg total) by mouth 2 (  two) times daily. For hypertension 03/05/22   Crecencio Mc, MD  losartan-hydrochlorothiazide (HYZAAR) 50-12.5 MG tablet Take 1 tablet by mouth daily. Patient not taking: Reported on 03/24/2022 03/24/22 04/23/22  Sharen Hones, MD  nortriptyline (PAMELOR) 10 MG capsule Take 1 capsule (10 mg total) by mouth 2 (two) times daily. For chronic headaches 12/28/21   Crecencio Mc, MD  Omega 3 1000 MG CAPS Take 1,000 mg by mouth 2 (two) times daily.    [provider]  oxyCODONE-acetaminophen (PERCOCET/ROXICET) 5-325 MG tablet Take 1-2 tablets by mouth every 4 (four) hours as needed for moderate pain. 03/29/22   Angiulli, Lavon Paganini, PA-C  pantoprazole (PROTONIX) 40 MG tablet Take 1 tablet (40 mg total) by mouth daily. 01/04/22   Crecencio Mc, MD  ranolazine (RANEXA) 500 MG 12 hr tablet Take 1 tablet (500 mg total) by mouth 2 (two) times daily. 12/28/21   Crecencio Mc, MD  spironolactone (ALDACTONE) 25 MG tablet Take 1 tablet (25 mg total) by mouth daily. Patient not taking: Reported on 03/24/2022 03/25/22   Sharen Hones, MD  traMADol (ULTRAM) 50 MG tablet Take 1 tablet (50 mg total) by mouth every 6 (six) hours as needed. Patient taking differently: Take 50 mg by mouth every 6 (six) hours as needed for severe pain. 02/05/22   Crecencio Mc, MD  valsartan  (DIOVAN) 320 MG tablet Take 320 mg by mouth daily.    [provider]    Physical Exam: Vitals:   03/29/22 1544  Pulse: 72  Resp: 19  Temp: 98.5 F (36.9 C)  TempSrc: Oral  SpO2: 97%   Exam  Constitutional: Elderly female who appears ill Eyes: PERRL, lids and conjunctivae normal ENMT: Mucous membranes are moist. Posterior pharynx clear of any exudate or lesions.  Neck: normal, supple, no masses, no thyromegaly Respiratory: clear to auscultation bilaterally, no wheezing, no crackles. Normal respiratory effort. No accessory muscle use.  Cardiovascular: Regular rate and rhythm, no murmurs / rubs / gallops. No extremity edema.  .  Abdomen: Lower abdominal tenderness noted on the right lower quadrant. Musculoskeletal: no clubbing / cyanosis. No joint deformity upper and lower extremities. Good ROM, no contractures. Normal muscle tone.  Skin: Wound present on the right inguinal area with staples in place and opening present as pictured below and serosanguineous drainage on dressing.  Left-sided inguinal wound healing well without drainage.    Neurologic: CN 2-12 grossly intact. Sensation intact, DTR normal. Strength 5/5 in all 4.  Psychiatric: Normal judgment and insight. Alert and oriented x 3. Normal mood.   Data Reviewed:  Reviewed patient's labs, and imaging as noted above in HPI  Assessment and Plan: Postoperative wound infection Acute.  Patient reported persistent drainage from the right inguinal wound from recent surgical procedure on 5/24.  White blood cell count elevated up to 20.1.  At this time it appears she remains afebrile.  Records note wound cultures had been obtained on 5/30 which grew moderate Proteus mirabilis and rare Klebsiella pneumoniae.  Blood cultures have been obtained today prior to transfer and she was started on cephalexin p.o.  Vascular surgery have been consulted. -Admit to telemetry bed -Follow-up blood cultures -Changed to Rocephin and  metronidazole IV -Recheck CBC tomorrow morning -Appreciate vascular surgery consultative services, will follow-up for further recommendations.  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  She had been started  on normal saline IV fluids at 50 mL/h due to reports of poor p.o. intake with nausea, vomiting, and diarrhea.  Review of records note she also appeared to be on diuretics of spironolactone  25 mg daily.  -Monitor intake and output -Held spironolactone -Bolus 500 mL of normal saline IV fluids, then placed on a rate of 75 mL/h -Recheck kidney function tomorrow  Nausea, vomiting, and diarrhea In addition to nausea and vomiting patient had reported having 3 bowel movements per day.  She had been on Colace 100 mg daily. -Monitor intake and output -Check acute abdominal series -Held Colace.  Follow-up abdominal series and determine if alternative regimen needs to be given  PAD s/p bilateral lower extremity endarterectomy Recent postop ardiac arrest secondary to NSTEMI Hyperlipidemia Condition developed after vascular intervention on 5/24.  Patient was treated in the ICU and required intubation.  Cardiac arrest thought secondary to acute culinary syndrome with NSTEMI.  Patient is unable to tolerate statins.  Plan is for outpatient follow-up with cardiology to evaluate other -Continue dual antiplatelet therapy and oxycodone as needed for pain  Anemia of chronic disease Hemoglobin 9.9 g/dL which appears near patient's baseline. -Continue to monitor  Anxiety and depression -Continue nortriptyline     Advance Care Planning:   Code Status: Full    Consults: Vascular surgery  Family Communication: Daughter updated at bedside  Severity of Illness: The appropriate patient status for this patient is INPATIENT. Inpatient status is judged to be reasonable and necessary in order to  provide the required intensity of service to ensure the patient's safety. The patient's presenting symptoms, physical exam findings, and initial radiographic and laboratory data in the context of their chronic comorbidities is felt to place them at high risk for further clinical deterioration. Furthermore, it is not anticipated that the patient will be medically stable for discharge from the hospital within 2 midnights of admission.   * I certify that at the point of admission it is my clinical judgment that the patient will require inpatient hospital care spanning beyond 2 midnights from the point of admission due to high intensity of service, high risk for further deterioration and high frequency of surveillance required.*  Author: Norval Morton, MD 03/29/2022 1:13 PM  For on call review [email or web address].

## 2022-03-29 NOTE — Consult Note (Signed)
Hospital Consult    Reason for Consult: Right groin infection Requesting Physician: Dr. Dagoberto Ligas MRN #:  [id number]  History of Present Illness: This is a 81 y.o. female currently in inpatient rehab after recent hospitalization at [facility] status post 03/17/2022 bilateral femoral endarterectomy, iliac stenting.  This was complicated by postprocedural cardiac arrest - NSTEMI.  Intervention was deferred due to bilateral groin wounds.  Grady was progressing well in rehab, however appreciated some malaise over the last 3 days.  Leukocytosis recently spiked 16 from 8.  On physical exam, there was serous fluid draining from the right groin incision.  On further examination, there was concern for necrosis.  Vascular surgery was consulted for further recommendations.  On exam, Phyliss was resting comfortably.  A [place] native, she has lived there for most of her life, with short stents and [place] and [place].  Both her son and daughter-in-law RN from [place] due to her recent hospitalization.  She denies fevers, chills but has appreciated malaise for the last 3 days.  She denies odor, but has had significant drainage from the right groin.  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 b.) TTE 11/16/2021: EF >55%; mild AS (MPG 9 mmHg)   CAD (coronary artery disease)    a.) s/p 3v CABG 03/29/2000   Carotid artery stenosis    a.) s/p LEFT CEA 09/09/2003. b.) Carotid doppler 78/29/5621: 3-08% LICA, CTO RICA; subclavian stenosis   Cataracts, bilateral    Cervical spondylosis without  myelopathy    Chronic diastolic CHF (congestive heart failure), NYHA class 3 (HCC)    a.) TTE 05/27/2016: EF >55%, mild LA enlargement, triv PR, mild MR, mod TR; G3DD. b.) TTE 12/12/2017: EF >55%, mild LVH, BAE, mild MR/PR, mod TR; RVSP 52.8 mmHg. c.) TTE 03/18/2020: EF >55%, BAD, AS (MPG 8.7 mmHg); triv MR, mild TR/PR. d.) TTE 11/16/2021: EF >55%, LVH, G1DD, triv MR, mild PR, mod TR; AS (MPG 9 mmHg); MS (MPG 5 mmHg)   Chronic kidney disease, stage III (moderate) (HCC)    Chronic narcotic use 06/24/2014   Chronic pain syndrome    GERD (gastroesophageal reflux disease)    History of 2019 novel coronavirus disease (COVID-19) 09/22/2021   Hyperlipidemia    Hypertension    Long term current use of antithrombotics/antiplatelets    a.) on daily DAPT therapy (ASA + clopidogrel)   Lumbar stenosis with neurogenic claudication    Mitral stenosis 11/16/2021   a.) TTE 11/16/2021: EF >55%; mod MS (MPG 5 mmHg)   Osteoarthritis of hip    Pulmonary hypertension ([place]) 12/12/2017   a.) TTE 12/12/2017: mild; RVSP 52.8 mmHg   PVD (peripheral vascular disease) (HCC)    Renal artery stenosis (HCC)    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    Past Surgical History:  Procedure Laterality Date  ABDOMINAL HYSTERECTOMY  1976   CAROTID ARTERY ANGIOPLASTY Left    CAROTID ENDARTERECTOMY Left 09/09/2003   Procedure: CAROTID ENDARTERECTOMY; Location: [facility]; Surgeon: Maura Crandall, MD   COLONOSCOPY WITH PROPOFOL N/A 08/16/2017   Procedure: COLONOSCOPY WITH PROPOFOL;  Surgeon: [name], MD;  Location: [facility] END[facility];  Service: Endoscopy;  Laterality: N/A;   CORONARY ANGIOPLASTY WITH STENT PLACEMENT  2000   CORONARY ARTERY BYPASS GRAFT N/A 03/29/2000   Procedure: 3v CORONARY ARTERY BYPASS GRAFT;  Location: [facility]   CYSTOSCOPY WITH STENT PLACEMENT Bilateral    ENDARTERECTOMY FEMORAL Bilateral 03/17/2022   Procedure: ENDARTERECTOMY FEMORAL ( BILATERAL SFA STENT);  Surgeon: Katha Cabal, MD;  Location: [facility];  Service: Vascular;  Laterality: Bilateral;   ESOPHAGOGASTRODUODENOSCOPY (EGD) WITH PROPOFOL N/A 08/16/2017   Procedure: ESOPHAGOGASTRODUODENOSCOPY (EGD) WITH PROPOFOL;  Surgeon: [name], MD;  Location: [facility];  Service: Endoscopy;  Laterality: N/A;   ESOPHAGOGASTRODUODENOSCOPY (EGD) WITH PROPOFOL N/A 06/29/2018   Procedure: ESOPHAGOGASTRODUODENOSCOPY (EGD) WITH PROPOFOL;  Surgeon: Virgel Manifold, MD;  Location: [facility];  Service: Endoscopy;  Laterality: N/A;   INSERTION OF ILIAC STENT Left 03/17/2022   Procedure: INSERTION OF ILIAC STENT;  Surgeon: Katha Cabal, MD;  Location: [facility];  Service: Vascular;  Laterality: Left;   LAPAROSCOPIC CHOLECYSTECTOMY Left 10/26/1999   Procedure: LAPAROSCOPIC CHOLECYSTECTOMY; Location: [facility]; Surgeon: Rochel Brome, MD   LEFT HEART CATH AND CORONARY ANGIOGRAPHY N/A 11/18/2021   Procedure: LEFT HEART CATH AND CORONARY ANGIOGRAPHY;  Surgeon: Yolonda Kida, MD;  Location: [facility];  Service: Cardiovascular;  Laterality: N/A;   LEFT HEART CATH AND CORS/GRAFTS ANGIOGRAPHY Left 11/19/2002   Procedure: LEFT HEART CATH AND CORS/GRAFTS ANGIOGRAPHY; Location: [facility]; Surgeon: Katrine Coho, MD   LEFT HEART CATH AND CORS/GRAFTS ANGIOGRAPHY Left 09/17/2003   Procedure: LEFT HEART CATH AND CORS/GRAFTS ANGIOGRAPHY; Location: [facility]; Surgeon: Katrine Coho, MD   LOWER EXTREMITY ANGIOGRAPHY Right 01/06/2022   Procedure: Lower Extremity Angiography;  Surgeon: Katha Cabal, MD;  Location: [facility];  Service: Cardiovascular;  Laterality: Right;   RENAL ARTERY ANGIOPLASTY Bilateral 12/2013   TOE AMPUTATION Right    small toe   TONSILLECTOMY AND ADENOIDECTOMY     TOTAL HIP ARTHROPLASTY Left 2005    TOTAL HIP ARTHROPLASTY Right 2015   UPPER EXTREMITY ANGIOGRAPHY Left 11/23/2021   Procedure: UPPER EXTREMITY ANGIOGRAPHY;  Surgeon: Algernon Huxley, MD;  Location: [facility];  Service: Cardiovascular;  Laterality: Left;    Allergies  Allergen Reactions   Citalopram Anaphylaxis    Throat closing    Dilaudid [Hydromorphone Hcl] Nausea And Vomiting   Hydrochlorothiazide Other (See Comments)    Decreased GFR (Nov 2015)   Liothyronine     Hair fell out, caused headaches    Nsaids     CKD stage III - avoid nephrotoxic drugs   Nubain [Nalbuphine Hcl]     Burning sensation in back   Penicillins Itching   Prasugrel Itching   Statins Itching    Prior to Admission medications   Medication Sig Start Date End Date Taking? Authorizing Provider  acetaminophen (TYLENOL) 325 MG tablet Take 1-2 tablets (325-650 mg total) by mouth every 4 (four) hours as needed for mild pain (or temp >/= 101 F). 03/29/22   Angiulli, Lavon Paganini, PA-C  amLODipine (NORVASC) 10 MG tablet Take 1 tablet (10 mg total) by mouth daily. 03/30/22   Angiulli, Lavon Paganini, PA-C  aspirin 81 MG tablet Take 81 mg by mouth daily.    [provider]  clopidogrel (PLAVIX) 75 MG tablet Take 1 tablet (75 mg total) by mouth daily. To prevent strokes 01/04/22   Crecencio Mc, MD  docusate sodium (COLACE) 100 MG capsule Take 1 capsule (100 mg total) by mouth daily. 03/30/22   Angiulli, Lavon Paganini, PA-C  ezetimibe (ZETIA) 10 MG tablet Take 1 tablet (10 mg total) by mouth daily. 01/11/22   Crecencio Mc, MD  ferrous sulfate 325 (65 FE) MG tablet Take 1 tablet (325 mg total) by mouth daily with breakfast. 03/30/22   Angiulli, Lavon Paganini, PA-C  labetalol (NORMODYNE) 300 MG tablet Take 1 tablet (300 mg total) by mouth 2 (two) times daily. For hypertension 03/05/22   Crecencio Mc, MD  nortriptyline (PAMELOR) 10 MG capsule Take 1 capsule (10 mg total) by mouth 2 (two) times daily. For chronic headaches 12/28/21   Crecencio Mc, MD   oxyCODONE-acetaminophen (PERCOCET/ROXICET) 5-325 MG tablet Take 1-2 tablets by mouth every 4 (four) hours as needed for moderate pain. 03/29/22   Angiulli, Lavon Paganini, PA-C  ranolazine (RANEXA) 500 MG 12 hr tablet Take 1 tablet (500 mg total) by mouth 2 (two) times daily. 12/28/21   Crecencio Mc, MD  spironolactone (ALDACTONE) 25 MG tablet Take 1 tablet (25 mg total) by mouth daily. Patient not taking: Reported on 03/24/2022 03/25/22   Sharen Hones, MD    Social History   Socioeconomic History   Marital status: Divorced    Spouse name: Not on file   Number of children: 3   Years of education: Not on file   Highest education level: Not on file  Occupational History   Occupation: retired  Tobacco Use   Smoking status: Former    Types: Cigarettes    Quit date: 10/25/1998    Years since quitting: 23.4   Smokeless tobacco: Never  Vaping Use   Vaping Use: Never used  Substance and Sexual Activity   Alcohol use: No    Alcohol/week: 0.0 standard drinks   Drug use: Never   Sexual activity: Not Currently  Other Topics Concern   Not on file  Social History Narrative   Daughter [name] ([place]); 1 in [employer]; 1 in [place]   Lives alone   Social Determinants of Health   Financial Resource Strain: Not on file  Food Insecurity: Not on file  Transportation Needs: Not on file  Physical Activity: Not on file  Stress: Not on file  Social Connections: Not on file  Intimate Partner Violence: Not on file   Family History  Problem Relation Age of Onset   Stroke Mother    Hypertension Mother    Diabetes Mother    Hypertension Father    Heart disease Sister        MI   Multiple sclerosis Daughter    Multiple sclerosis Son    Cerebral aneurysm Son    Seizures Son    Cerebral aneurysm Son    Breast cancer Paternal Aunt 41    ROS: Otherwise negative unless mentioned in HPI  Physical Examination  Vitals:   03/29/22 1544  Pulse: 72  Resp: 19  Temp: 98.5 F (36.9 C)  SpO2: 97%    There is no height or weight on file to calculate BMI.  General:  WDWN in NAD Gait: Not observed HENT: WNL, normocephalic Pulmonary: normal non-labored breathing, without Rales, rhonchi,  wheezing Cardiac: regular, Abdomen: soft, NT/ND, no masses Skin: without rashes Vascular Exam: Bilateral groins with longitudinal incisions, right-sided with  necrosis, nonviable tissue, draining.  Left side no drainage Extremities: without ischemic changes, without Gangrene , without cellulitis; without open wounds;  Musculoskeletal: no muscle wasting or atrophy  Neurologic: A&O X 3;  No focal weakness or paresthesias are detected; speech is fluent/normal Psychiatric:  The pt has Normal affect. Lymph:  Unremarkable  CBC    Component Value Date/Time   WBC 20.1 (H) 03/29/2022 1210   RBC 3.28 (L) 03/29/2022 1210   HGB 9.9 (L) 03/29/2022 1210   HGB 9.2 (L) 04/13/2014 0952   HCT 29.8 (L) 03/29/2022 1210   HCT 24.7 (L) 04/12/2014 1143   PLT 399 03/29/2022 1210   PLT 182 04/11/2014 0558   MCV 90.9 03/29/2022 1210   MCV 85 03/27/2014 0944   MCH 30.2 03/29/2022 1210   MCHC 33.2 03/29/2022 1210   RDW 13.4 03/29/2022 1210   RDW 13.7 03/27/2014 0944   LYMPHSABS 0.8 03/29/2022 1210   MONOABS 0.6 03/29/2022 1210   EOSABS 0.1 03/29/2022 1210   BASOSABS 0.0 03/29/2022 1210    BMET    Component Value Date/Time   NA 136 03/29/2022 1210   NA 139 09/13/2014 0000   NA 134 (L) 04/12/2014 0558   K 4.4 03/29/2022 1210   K 4.4 04/12/2014 0558   CL 105 03/29/2022 1210   CL 101 04/12/2014 0558   CO2 20 (L) 03/29/2022 1210   CO2 27 04/12/2014 0558   GLUCOSE 138 (H) 03/29/2022 1210   GLUCOSE 90 04/12/2014 0558   BUN 27 (H) 03/29/2022 1210   BUN 18 09/13/2014 0000   BUN 16 04/12/2014 0558   CREATININE 2.14 (H) 03/29/2022 1210   CREATININE 1.12 04/12/2014 0558   CALCIUM 9.1 03/29/2022 1210   CALCIUM 8.5 04/12/2014 0558   GFRNONAA 23 (L) 03/29/2022 1210   GFRNONAA 49 (L) 04/12/2014 0558   GFRAA 50  (L) 03/10/2020 1311   GFRAA 57 (L) 04/12/2014 0558    COAGS: Lab Results  Component Value Date   INR 1.0 03/27/2014     Non-Invasive Vascular Imaging:   -    ASSESSMENT/PLAN: This is a 81 y.o. female with postoperative right groin infection.  She would benefit from incision and debridement tomorrow in an effort to ensure the previously placed patch is not involved.  I plan to resect nonviable tissue at this time.  After discussing the risk and benefits, Karson elected to proceed.   Please make n.p.o. at midnight   [name] MD MS [facility] [phone] 03/29/2022  6:25 PM

## 2022-03-29 NOTE — Progress Notes (Addendum)
Inpatient Rehabilitation Discharge Medication Review by a Pharmacist  A complete drug regimen review was completed for this patient to identify any potential clinically significant medication issues.  High Risk Drug Classes Is patient taking? Indication by Medication  Antipsychotic No   Anticoagulant No   Antibiotic No Cephalexin: wound infection  Opioid Yes Percocet: PRN pain  Antiplatelet Yes Aspirin, Clopidogrel s/p endarterectomy   Hypoglycemics/insulin No   Vasoactive Medication Yes amlodipine, spironolactone: HTN Ranexa: CP  Chemotherapy No   Other Yes Nortriptyline: Headache ppx Docusate: constipation Ezetimibe: HLD     Type of Medication Issue Identified Description of Issue Recommendation(s)  Drug Interaction(s) (clinically significant)     Duplicate Therapy     Allergy     No Medication Administration End Date     Incorrect Dose     Additional Drug Therapy Needed     Significant med changes from prior encounter (inform family/care partners about these prior to discharge). Irbesartan, hctz held for AKI Resume once AKI resolves if medically appropriate  Other       Clinically significant medication issues were identified that warrant physician communication and completion of prescribed/recommended actions by midnight of the next day:  No  Pharmacist comments: None  Time spent performing this drug regimen review (minutes):  20 minutes   Thank you for allowing pharmacy to be a part of this patient's care.  Ardyth Harps, PharmD Clinical Pharmacist

## 2022-03-29 NOTE — Progress Notes (Signed)
Notified Dr.Lovorn of opening to surgical incision to right groin; opening is larger today. Moderate amount of serosanguineous drainage noted.   Yehuda Mao, LPN

## 2022-03-29 NOTE — Progress Notes (Signed)
Inpatient Rehabilitation Care Coordinator Discharge Note   Patient Details  Name: Sabrina [name] MRN: [id number] Date of Birth: 1940-11-21   Discharge location: D/c to acute due to medical concerns  Length of Stay: 4 days  Discharge activity level: Min A to Supervision  Home/community participation: Limited  Patient response [id number] Literacy - How often do you need to have someone help you when you read instructions, pamphlets, or other written material from your doctor or pharmacy?: Never  Patient response LO:[id number] Isolation - How often do you feel lonely or isolated from those around you?: Patient unable to respond  Services provided included: MD, RD, PT, OT, RN, CM, TR, Pharmacy, Neuropsych, SW  Financial Services:  Charity fundraiser Utilized: Medicare    Choices offered to/list presented to: N/A  Follow-up services arranged:    Patient response to transportation need: Is the patient able to respond to transportation needs?: Yes In the past 12 months, has lack of transportation kept you from medical appointments or from getting medications?: No In the past 12 months, has lack of transportation kept you from meetings, work, or from getting things needed for daily living?: No    Comments (or additional information):  Patient/Family verbalized understanding of follow-up arrangements:  Yes  Individual responsible for coordination of the follow-up plan: contact pt or pt dtr [name]  Confirmed correct DME delivered: Rana Snare 03/29/2022    Rana Snare

## 2022-03-29 NOTE — Progress Notes (Signed)
Pt to radiology.

## 2022-03-29 NOTE — Progress Notes (Signed)
Physical Therapy Note  Patient Details  Name: Sabrina [name] MRN: [id number] Date of Birth: 26-Aug-1941 Today's Date: 03/29/2022   Physical Therapy Discharge Note  This patient was unable to complete the inpatient rehab program due to d/c back to acute care; therefore did not meet their long term goals. Pt left the program at a CGA assist level for their functional mobility/ transfers and mod A for bed mobility. This patient is being discharged from PT services at this time.  Pt's perception of pain in the last five days was unable to answer at this time.    See CareTool for functional status details  If the patient is able to return to inpatient rehabilitation within 3 midnights, this may be considered an interrupted stay and therapy services will resume as ordered. Modification and reinstatement of their goals will be made upon completion of therapy service reevaluations.      Excell Seltzer, PT, DPT, CSRS 03/29/2022, 12:23 PM

## 2022-03-29 NOTE — Progress Notes (Signed)
Patient ID: Sabrina [name], female   DOB: 1941-09-12, 82 y.o.   MRN: [id number]  SW informed by medical team pt will transfer to acute due to medical concerns.  Loralee Pacas, MSW, [place] Office: [phone] Cell: [phone] Fax: [phone]

## 2022-03-29 NOTE — Patient Care Conference (Signed)
Inpatient RehabilitationTeam Conference and Plan of Care Update Date: 03/29/2022   Time: 11:27 AM    Patient Name: Sabrina [name]      Medical Record Number: [id number]  Date of Birth: 1941-10-22 Sex: Female         Room/Bed: 4W25C/4W25C-01 Payor Info: Payor: MEDICARE / Plan: MEDICARE PART A AND B / Product Type: *No Product type* /    Admit Date/Time:  03/24/2022  2:33 PM  Primary Diagnosis:  [facility] Problems: Principal Problem:   Debility    Expected Discharge Date: Expected Discharge Date: 03/29/22  Team Members Present: Physician leading conference: Dr. Courtney Heys Social Worker Present: Loralee Pacas, [place] Nurse Present: Dorthula Nettles, RN PT Present: Excell Seltzer, PT OT Present: Roanna Epley, Nanci Pina, OT     Current Status/Progress Goal Weekly Team Focus  Bowel/Bladder   continent b/b  remain continent  toilet as needed   Swallow/Nutrition/ Hydration             ADL's   min A LB bahting/dressing; supervision UB bathing/dressing; CGA functional transfers  mod I overall  discharge to acute   Mobility   mod A bed mobility, CGA transfers with RW, gait up to 150 ft RW CGA  mod I overall at ambulatory level  bed mobility, transfers, gait, pain management   Communication             Safety/Cognition/ Behavioral Observations            Pain   6/10, managed with PRN medications  < 3  assess pain q 4hr and prn   Skin   incision to groin draining  no new breakdown  assess skin q shift and prn     Discharge Planning:  D/c to home with inital 24/7 care support from dtr and her husband.periods of intermittent support when dtr has to travel back to [place] to get medication for MS (if unable to make other arrangements). Dtr confirms she will be there to support her mother. Pt reports she has PRN support from friends/family.   Team Discussion: Patient discharging to acute due to wound dehiscence. Will likely not come back to CIR due to  patient's progressing with therapy. Continent B/B, pain managed with PRN medications. Groin incision draining continuously. Patient to discharge home with daughter and husband after acute stay.  Patient on target to meet rehab goals: yes, mod I goals  *See Care Plan and progress notes for long and short-term goals.   Revisions to Treatment Plan:  Discharging to acute.   Teaching Needs: Education was on-going and adequate for discharge.   Current Barriers to Discharge: Wound care  Possible Resolutions to Barriers: Discharge to acute to manage wound     Medical Summary Current Status: leukocytosis in setting of possible wound infection at R groin  Barriers to Discharge: Decreased family/caregiver support;Home enviroment access/layout;Medical stability;Wound care;Pending Surgery  Barriers to Discharge Comments: transfer to acute Possible Resolutions to Barriers/Weekly Focus: transfer ot acute       I attest that I was present, lead the team conference, and concur with the assessment and plan of the team.   Cristi Loron 03/29/2022, 1:37 PM

## 2022-03-29 NOTE — Progress Notes (Signed)
Occupational Therapy Session Note  Patient Details  Name: Sabrina [name] MRN: [id number] Date of Birth: September 22, 1941  Today's Date: 03/29/2022 OT Individual Time: 1015-1056 OT Individual Time Calculation (min): 41 min  and Today's Date: 03/29/2022 OT Missed Time: 35 Minutes Missed Time Reason: Pain;Other (comment) (n/v)   Short Term Goals: Week 1:  OT Short Term Goal 1 (Week 1): STG=LTG  Skilled Therapeutic Interventions/Progress Updates:    Pt resting in bed upon arrival with daughter present. See pain assessment below. Supine>sit EOB (bed flat and no bed rails) with min A and increased pain around surgical site and abdomen. Pt reported dizziness but quickly resolved. Pt with ongoing discomfort sitting EOB. Pt amb with RW (CGA) to bathroom to use toilet. Pt continent of bowel and bladder. CGA for toileting tasks. Pt reports she feels weak every time she stands up. BP seated-115/46. Pt requested to use w/c to return to bed. Stand pivot transfers with CGA. Sit>supine in bed with max A for BLE mgmt. Pt remained in bed with all needs within reach. Bed alarm activated. Daughter and son-in-law present. LPN notifed of pt's status.   Therapy Documentation Precautions:  Precautions Precautions: Fall Precaution Comments: rib fx from CPR; pain in LEs Restrictions Weight Bearing Restrictions: No General: General OT Amount of Missed Time: 35 Minutes Vital Signs:  Pain: Pt reports she is "feeling better" but immediately reported increase in abdominal pain and surgical pain when transitioning to sitting up EOB; rest and LPN Erica notified  Therapy/Group: Individual Therapy  Leroy Libman 03/29/2022, 11:13 AM

## 2022-03-30 ENCOUNTER — Encounter (HOSPITAL_COMMUNITY)
Admission: RE | Disposition: A | Discharge status: Rehabilitation Facility | Payer: Self-pay | Source: Ambulatory Visit | Attending: Family Medicine

## 2022-03-30 ENCOUNTER — Inpatient Hospital Stay (HOSPITAL_COMMUNITY): Payer: Medicare Other | Admitting: Anesthesiology

## 2022-03-30 ENCOUNTER — Encounter (HOSPITAL_COMMUNITY): Payer: Self-pay | Admitting: Internal Medicine

## 2022-03-30 ENCOUNTER — Other Ambulatory Visit: Payer: Self-pay

## 2022-03-30 DIAGNOSIS — I2510 Atherosclerotic heart disease of native coronary artery without angina pectoris: Secondary | ICD-10-CM

## 2022-03-30 DIAGNOSIS — T8140XA Infection following a procedure, unspecified, initial encounter: Secondary | ICD-10-CM

## 2022-03-30 DIAGNOSIS — I21A1 Myocardial infarction type 2: Secondary | ICD-10-CM | POA: Diagnosis not present

## 2022-03-30 DIAGNOSIS — D62 Acute posthemorrhagic anemia: Secondary | ICD-10-CM | POA: Diagnosis not present

## 2022-03-30 DIAGNOSIS — I110 Hypertensive heart disease with heart failure: Secondary | ICD-10-CM

## 2022-03-30 DIAGNOSIS — I739 Peripheral vascular disease, unspecified: Secondary | ICD-10-CM

## 2022-03-30 DIAGNOSIS — I70229 Atherosclerosis of native arteries of extremities with rest pain, unspecified extremity: Secondary | ICD-10-CM

## 2022-03-30 HISTORY — PX: GROIN DEBRIDEMENT: SHX5159

## 2022-03-30 HISTORY — PX: ENDARTERECTOMY FEMORAL: SHX5804

## 2022-03-30 LAB — CBC
HCT: 26.1 % — ABNORMAL LOW (ref 36.0–46.0)
Hemoglobin: 9 g/dL — ABNORMAL LOW (ref 12.0–15.0)
MCH: 29.9 pg (ref 26.0–34.0)
MCHC: 34.5 g/dL (ref 30.0–36.0)
MCV: 86.7 fL (ref 80.0–100.0)
Platelets: 290 10*3/uL (ref 150–400)
RBC: 3.01 MIL/uL — ABNORMAL LOW (ref 3.87–5.11)
RDW: 14 % (ref 11.5–15.5)
WBC: 15.1 10*3/uL — ABNORMAL HIGH (ref 4.0–10.5)
nRBC: 0 % (ref 0.0–0.2)

## 2022-03-30 LAB — BASIC METABOLIC PANEL
Anion gap: 5 (ref 5–15)
Anion gap: 6 (ref 5–15)
BUN: 24 mg/dL — ABNORMAL HIGH (ref 8–23)
BUN: 21 mg/dL (ref 8–23)
CO2: 22 mmol/L (ref 22–32)
CO2: 22 mmol/L (ref 22–32)
Calcium: 8.8 mg/dL — ABNORMAL LOW (ref 8.9–10.3)
Calcium: 8 mg/dL — ABNORMAL LOW (ref 8.9–10.3)
Chloride: 109 mmol/L (ref 98–111)
Chloride: 107 mmol/L (ref 98–111)
Creatinine, Ser: 1.9 mg/dL — ABNORMAL HIGH (ref 0.44–1.00)
Creatinine, Ser: 1.61 mg/dL — ABNORMAL HIGH (ref 0.44–1.00)
GFR, Estimated: 26 mL/min — ABNORMAL LOW (ref >60)
GFR, Estimated: 32 mL/min — ABNORMAL LOW (ref >60)
Glucose, Bld: 100 mg/dL — ABNORMAL HIGH (ref 70–99)
Glucose, Bld: 126 mg/dL — ABNORMAL HIGH (ref 70–99)
Potassium: 3.9 mmol/L (ref 3.5–5.1)
Potassium: 4 mmol/L (ref 3.5–5.1)
Sodium: 136 mmol/L (ref 135–145)
Sodium: 135 mmol/L (ref 135–145)

## 2022-03-30 LAB — CBC WITH DIFFERENTIAL/PLATELET
Abs Immature Granulocytes: 0.09 10*3/uL — ABNORMAL HIGH (ref 0.00–0.07)
Basophils Absolute: 0 10*3/uL (ref 0.0–0.1)
Basophils Relative: 0 %
Eosinophils Absolute: 0.5 10*3/uL (ref 0.0–0.5)
Eosinophils Relative: 4 %
HCT: 26.2 % — ABNORMAL LOW (ref 36.0–46.0)
Hemoglobin: 8.6 g/dL — ABNORMAL LOW (ref 12.0–15.0)
Immature Granulocytes: 1 %
Lymphocytes Relative: 11 %
Lymphs Abs: 1.7 10*3/uL (ref 0.7–4.0)
MCH: 29.4 pg (ref 26.0–34.0)
MCHC: 32.8 g/dL (ref 30.0–36.0)
MCV: 89.4 fL (ref 80.0–100.0)
Monocytes Absolute: 0.9 10*3/uL (ref 0.1–1.0)
Monocytes Relative: 6 %
Neutro Abs: 12 10*3/uL — ABNORMAL HIGH (ref 1.7–7.7)
Neutrophils Relative %: 78 %
Platelets: 391 10*3/uL (ref 150–400)
RBC: 2.93 MIL/uL — ABNORMAL LOW (ref 3.87–5.11)
RDW: 13.4 % (ref 11.5–15.5)
WBC: 15.3 10*3/uL — ABNORMAL HIGH (ref 4.0–10.5)
nRBC: 0 % (ref 0.0–0.2)

## 2022-03-30 LAB — HEMOGLOBIN AND HEMATOCRIT, BLOOD
HCT: 19.1 % — ABNORMAL LOW (ref 36.0–46.0)
HCT: 25.9 % — ABNORMAL LOW (ref 36.0–46.0)
Hemoglobin: 6.3 g/dL — CL (ref 12.0–15.0)
Hemoglobin: 9.2 g/dL — ABNORMAL LOW (ref 12.0–15.0)

## 2022-03-30 LAB — TROPONIN I (HIGH SENSITIVITY): Troponin I (High Sensitivity): 28 ng/L — ABNORMAL HIGH (ref <18)

## 2022-03-30 LAB — POCT I-STAT 7, (LYTES, BLD GAS, ICA,H+H)
Acid-base deficit: 2 mmol/L (ref 0.0–2.0)
Bicarbonate: 21.8 mmol/L (ref 20.0–28.0)
Calcium, Ion: 1.2 mmol/L (ref 1.15–1.40)
HCT: 19 % — ABNORMAL LOW (ref 36.0–46.0)
Hemoglobin: 6.5 g/dL — CL (ref 12.0–15.0)
O2 Saturation: 100 %
Patient temperature: 36.2
Potassium: 4 mmol/L (ref 3.5–5.1)
Sodium: 136 mmol/L (ref 135–145)
TCO2: 23 mmol/L (ref 22–32)
pCO2 arterial: 30.7 mmHg — ABNORMAL LOW (ref 32–48)
pH, Arterial: 7.457 — ABNORMAL HIGH (ref 7.35–7.45)
pO2, Arterial: 266 mmHg — ABNORMAL HIGH (ref 83–108)

## 2022-03-30 LAB — LACTIC ACID, PLASMA: Lactic Acid, Venous: 1 mmol/L (ref 0.5–1.9)

## 2022-03-30 LAB — MAGNESIUM: Magnesium: 1.7 mg/dL (ref 1.7–2.4)

## 2022-03-30 LAB — SURGICAL PCR SCREEN
MRSA, PCR: NEGATIVE
Staphylococcus aureus: POSITIVE — AB

## 2022-03-30 LAB — FIBRINOGEN
Fibrinogen: 496 mg/dL — ABNORMAL HIGH (ref 210–475)
Fibrinogen: 448 mg/dL (ref 210–475)

## 2022-03-30 LAB — PROTIME-INR
INR: 1.3 — ABNORMAL HIGH (ref 0.8–1.2)
INR: 1.3 — ABNORMAL HIGH (ref 0.8–1.2)
Prothrombin Time: 16 seconds — ABNORMAL HIGH (ref 11.4–15.2)
Prothrombin Time: 15.6 seconds — ABNORMAL HIGH (ref 11.4–15.2)

## 2022-03-30 LAB — APTT
aPTT: 35 seconds (ref 24–36)
aPTT: 34 seconds (ref 24–36)

## 2022-03-30 LAB — POCT ACTIVATED CLOTTING TIME: Activated Clotting Time: 227 seconds

## 2022-03-30 LAB — PLATELET COUNT: Platelets: 322 10*3/uL (ref 150–400)

## 2022-03-30 LAB — PREPARE RBC (CROSSMATCH)

## 2022-03-30 SURGERY — DEBRIDEMENT, INGUINAL REGION
Anesthesia: General | Site: Groin | Laterality: Right

## 2022-03-30 MED ORDER — PIPERACILLIN-TAZOBACTAM 3.375 G IVPB 30 MIN
3.3750 g | Freq: Once | INTRAVENOUS | Status: AC
  Administered 2022-03-30: 3.375 g via INTRAVENOUS
  Filled 2022-03-30: qty 50

## 2022-03-30 MED ORDER — FENTANYL CITRATE (PF) 100 MCG/2ML IJ SOLN
25.0000 ug | INTRAMUSCULAR | Status: DC | PRN
  Administered 2022-03-30: 25 ug via INTRAVENOUS
  Administered 2022-03-30 (×2): 50 ug via INTRAVENOUS

## 2022-03-30 MED ORDER — ONDANSETRON HCL 4 MG/2ML IJ SOLN
INTRAMUSCULAR | Status: DC | PRN
  Administered 2022-03-30: 4 mg via INTRAVENOUS

## 2022-03-30 MED ORDER — FENTANYL CITRATE (PF) 100 MCG/2ML IJ SOLN
INTRAMUSCULAR | Status: AC
  Filled 2022-03-30: qty 2

## 2022-03-30 MED ORDER — ROCURONIUM BROMIDE 10 MG/ML (PF) SYRINGE
PREFILLED_SYRINGE | INTRAVENOUS | Status: DC | PRN
  Administered 2022-03-30: 30 mg via INTRAVENOUS

## 2022-03-30 MED ORDER — LABETALOL HCL 5 MG/ML IV SOLN
5.0000 mg | INTRAVENOUS | Status: DC | PRN
  Administered 2022-04-04 – 2022-04-05 (×6): 5 mg via INTRAVENOUS
  Filled 2022-03-30 (×6): qty 4

## 2022-03-30 MED ORDER — PROTAMINE SULFATE 10 MG/ML IV SOLN
INTRAVENOUS | Status: DC | PRN
  Administered 2022-03-30 (×5): 10 mg via INTRAVENOUS

## 2022-03-30 MED ORDER — LACTATED RINGERS IV SOLN: INTRAVENOUS | Status: DC | PRN

## 2022-03-30 MED ORDER — SODIUM CHLORIDE 0.9% IV SOLUTION: Freq: Once | INTRAVENOUS | Status: DC

## 2022-03-30 MED ORDER — SODIUM CHLORIDE 0.9 % IV SOLN
INTRAVENOUS | Status: DC | PRN
  Administered 2022-03-30: 500 mL

## 2022-03-30 MED ORDER — PHENYLEPHRINE HCL-NACL 20-0.9 MG/250ML-% IV SOLN
INTRAVENOUS | Status: DC | PRN
  Administered 2022-03-30: 25 ug/min via INTRAVENOUS

## 2022-03-30 MED ORDER — HEPARIN 6000 UNIT IRRIGATION SOLUTION
Status: DC | PRN
  Administered 2022-03-30: 1

## 2022-03-30 MED ORDER — PHENYLEPHRINE 80 MCG/ML (10ML) SYRINGE FOR IV PUSH (FOR BLOOD PRESSURE SUPPORT)
PREFILLED_SYRINGE | INTRAVENOUS | Status: DC | PRN
  Administered 2022-03-30: 40 ug via INTRAVENOUS
  Administered 2022-03-30: 80 ug via INTRAVENOUS
  Administered 2022-03-30 (×2): 40 ug via INTRAVENOUS

## 2022-03-30 MED ORDER — FENTANYL CITRATE (PF) 250 MCG/5ML IJ SOLN
INTRAMUSCULAR | Status: DC | PRN
  Administered 2022-03-30: 50 ug via INTRAVENOUS
  Administered 2022-03-30 (×2): 25 ug via INTRAVENOUS
  Administered 2022-03-30 (×3): 50 ug via INTRAVENOUS

## 2022-03-30 MED ORDER — 0.9 % SODIUM CHLORIDE (POUR BTL) OPTIME
TOPICAL | Status: DC | PRN
  Administered 2022-03-30: 1000 mL

## 2022-03-30 MED ORDER — ALBUMIN HUMAN 5 % IV SOLN: INTRAVENOUS | Status: DC | PRN

## 2022-03-30 MED ORDER — SUGAMMADEX SODIUM 200 MG/2ML IV SOLN
INTRAVENOUS | Status: DC | PRN
  Administered 2022-03-30: 200 mg via INTRAVENOUS

## 2022-03-30 MED ORDER — PROPOFOL 10 MG/ML IV BOLUS
INTRAVENOUS | Status: AC
  Filled 2022-03-30: qty 20

## 2022-03-30 MED ORDER — MUPIROCIN 2 % EX OINT
1.0000 "application " | TOPICAL_OINTMENT | Freq: Two times a day (BID) | CUTANEOUS | Status: AC
  Administered 2022-03-30 – 2022-04-03 (×9): 1 via NASAL
  Filled 2022-03-30 (×2): qty 22

## 2022-03-30 MED ORDER — FENTANYL CITRATE (PF) 250 MCG/5ML IJ SOLN
INTRAMUSCULAR | Status: AC
  Filled 2022-03-30: qty 5

## 2022-03-30 MED ORDER — EPHEDRINE SULFATE-NACL 50-0.9 MG/10ML-% IV SOSY
PREFILLED_SYRINGE | INTRAVENOUS | Status: DC | PRN
  Administered 2022-03-30 (×2): 5 mg via INTRAVENOUS

## 2022-03-30 MED ORDER — POLYETHYLENE GLYCOL 3350 17 G PO PACK: 17.0000 g | PACK | Freq: Every day | ORAL | Status: DC | PRN

## 2022-03-30 MED ORDER — ACETAMINOPHEN 10 MG/ML IV SOLN
1000.0000 mg | Freq: Once | INTRAVENOUS | Status: AC
  Administered 2022-03-30: 1000 mg via INTRAVENOUS

## 2022-03-30 MED ORDER — ACETAMINOPHEN 10 MG/ML IV SOLN
INTRAVENOUS | Status: AC
  Filled 2022-03-30: qty 100

## 2022-03-30 MED ORDER — LABETALOL HCL 5 MG/ML IV SOLN
INTRAVENOUS | Status: AC
  Filled 2022-03-30: qty 4

## 2022-03-30 MED ORDER — LIDOCAINE 2% (20 MG/ML) 5 ML SYRINGE
INTRAMUSCULAR | Status: DC | PRN
  Administered 2022-03-30: 40 mg via INTRAVENOUS

## 2022-03-30 MED ORDER — MAGNESIUM SULFATE 2 GM/50ML IV SOLN
2.0000 g | Freq: Once | INTRAVENOUS | Status: AC
  Administered 2022-03-30: 2 g via INTRAVENOUS
  Filled 2022-03-30: qty 50

## 2022-03-30 MED ORDER — PIPERACILLIN-TAZOBACTAM 3.375 G IVPB
3.3750 g | Freq: Three times a day (TID) | INTRAVENOUS | Status: DC
  Administered 2022-03-30 – 2022-04-02 (×9): 3.375 g via INTRAVENOUS
  Filled 2022-03-30 (×9): qty 50

## 2022-03-30 MED ORDER — HEPARIN SODIUM (PORCINE) 1000 UNIT/ML IJ SOLN
INTRAMUSCULAR | Status: DC | PRN
  Administered 2022-03-30: 7000 [IU] via INTRAVENOUS

## 2022-03-30 MED ORDER — PROPOFOL 10 MG/ML IV BOLUS
INTRAVENOUS | Status: DC | PRN
  Administered 2022-03-30: 80 mg via INTRAVENOUS

## 2022-03-30 MED ORDER — CHLORHEXIDINE GLUCONATE CLOTH 2 % EX PADS
6.0000 | MEDICATED_PAD | Freq: Every day | CUTANEOUS | Status: DC
Start: 2022-03-31 — End: 2022-04-05
  Administered 2022-03-31 – 2022-04-05 (×6): 6 via TOPICAL

## 2022-03-30 MED ORDER — DOCUSATE SODIUM 100 MG PO CAPS
100.0000 mg | ORAL_CAPSULE | Freq: Two times a day (BID) | ORAL | Status: DC | PRN
  Administered 2022-04-01: 100 mg via ORAL
  Filled 2022-03-30: qty 1

## 2022-03-30 SURGICAL SUPPLY — 53 items
ADH SKN CLS APL DERMABOND .7 (GAUZE/BANDAGES/DRESSINGS) ×1
APL SKNCLS STERI-STRIP NONHPOA (GAUZE/BANDAGES/DRESSINGS) ×1
BAG COUNTER SPONGE SURGICOUNT (BAG) ×3 IMPLANT
BAG DECANTER FOR FLEXI CONT (MISCELLANEOUS) ×2 IMPLANT
BAG SPNG CNTER NS LX DISP (BAG) ×1
BENZOIN TINCTURE PRP APPL 2/3 (GAUZE/BANDAGES/DRESSINGS) ×1 IMPLANT
BOOT SUTURE AID YELLOW STND (SUTURE) ×1 IMPLANT
CANISTER SUCT 3000ML PPV (MISCELLANEOUS) ×3 IMPLANT
CANISTER WOUNDNEG PRESSURE 500 (CANNISTER) ×1 IMPLANT
CANNULA VESSEL 3MM 2 BLNT TIP (CANNULA) ×1 IMPLANT
COVER PROBE W GEL 5X96 (DRAPES) ×1 IMPLANT
DERMABOND ADVANCED (GAUZE/BANDAGES/DRESSINGS) ×1
DERMABOND ADVANCED .7 DNX12 (GAUZE/BANDAGES/DRESSINGS) IMPLANT
DRAPE INCISE IOBAN 66X45 STRL (DRAPES) ×1 IMPLANT
DRAPE WARM FLUID 44X44 (DRAPES) ×1 IMPLANT
DRSG VAC ATS MED SENSATRAC (GAUZE/BANDAGES/DRESSINGS) ×1 IMPLANT
ELECT REM PT RETURN 9FT ADLT (ELECTROSURGICAL) ×2
ELECTRODE REM PT RTRN 9FT ADLT (ELECTROSURGICAL) ×2 IMPLANT
GAUZE SPONGE 4X4 12PLY STRL (GAUZE/BANDAGES/DRESSINGS) ×3 IMPLANT
GLOVE BIO SURGEON STRL SZ7.5 (GLOVE) ×3 IMPLANT
GLOVE BIOGEL PI IND STRL 8 (GLOVE) ×2 IMPLANT
GLOVE BIOGEL PI INDICATOR 8 (GLOVE) ×2
GLOVE SRG 8 PF TXTR STRL LF DI (GLOVE) ×2 IMPLANT
GLOVE SURG UNDER POLY LF SZ8 (GLOVE) ×4
GOWN BRE IMP SLV AUR LG STRL (GOWN DISPOSABLE) ×1 IMPLANT
GOWN STRL REUS W/ TWL LRG LVL3 (GOWN DISPOSABLE) ×4 IMPLANT
GOWN STRL REUS W/TWL LRG LVL3 (GOWN DISPOSABLE) ×8
HEMOSTAT SNOW SURGICEL 2X4 (HEMOSTASIS) ×1 IMPLANT
KIT BASIN OR (CUSTOM PROCEDURE TRAY) ×3 IMPLANT
KIT TURNOVER KIT B (KITS) ×3 IMPLANT
LOOP VESSEL MAXI BLUE (MISCELLANEOUS) ×2 IMPLANT
LOOP VESSEL MINI RED (MISCELLANEOUS) ×2 IMPLANT
NS IRRIG 1000ML POUR BTL (IV SOLUTION) ×4 IMPLANT
PACK GENERAL/GYN (CUSTOM PROCEDURE TRAY) ×3 IMPLANT
PACK UNIVERSAL I (CUSTOM PROCEDURE TRAY) ×3 IMPLANT
POWDER MYRIAD MORCELLS 1000MG (Miscellaneous) ×1 IMPLANT
STAPLER VISISTAT 35W (STAPLE) ×1 IMPLANT
SUT MNCRL AB 4-0 PS2 18 (SUTURE) ×1 IMPLANT
SUT PROLENE 5 0 C 1 24 (SUTURE) ×1 IMPLANT
SUT PROLENE 5 0 C 1 36 (SUTURE) ×2 IMPLANT
SUT PROLENE 6 0 BV (SUTURE) ×2 IMPLANT
SUT SILK 2 0 (SUTURE) ×2
SUT SILK 2-0 18XBRD TIE 12 (SUTURE) IMPLANT
SUT SILK 3 0 (SUTURE) ×2
SUT SILK 3-0 18XBRD TIE 12 (SUTURE) IMPLANT
SUT SILK 4 0 (SUTURE) ×2
SUT SILK 4-0 18XBRD TIE 12 (SUTURE) IMPLANT
SUT VIC AB 2-0 CT1 27 (SUTURE) ×6
SUT VIC AB 2-0 CT1 TAPERPNT 27 (SUTURE) IMPLANT
SUT VIC AB 3-0 SH 27 (SUTURE) ×2
SUT VIC AB 3-0 SH 27X BRD (SUTURE) IMPLANT
SYR 20ML LL LF (SYRINGE) ×1 IMPLANT
TOWEL GREEN STERILE (TOWEL DISPOSABLE) ×3 IMPLANT

## 2022-03-30 NOTE — Progress Notes (Signed)
PT Cancellation Note  Patient Details Name: Sabrina [name] MRN: [id number] DOB: [birth year]   Cancelled Treatment:    Reason Eval/Treat Not Completed: Patient at procedure or test/unavailable   [name] B [name] 03/30/2022, 7:20 AM [place] Office: [phone]

## 2022-03-30 NOTE — Transfer of Care (Signed)
Immediate Anesthesia Transfer of Care Note  Patient: Sabrina [name]  Procedure(s) Performed: RIGHT GROIN IRRIGATION & DEBRIDEMENT (Right: Groin) RE-EXPOSURE OF RIGHT COMMON FEMORAL ARTERY, RE-DO RIGHT FEMORAL ENDARTERECTOMY WITH VEIN PATCH (Right: Groin)  Patient Location: PACU  Anesthesia Type:General  Level of Consciousness: awake, alert  and oriented  Airway & Oxygen Therapy: Patient Spontanous Breathing and Patient connected to nasal cannula oxygen  Post-op Assessment: Report given to RN and Post -op Vital signs reviewed and stable  Post vital signs: Reviewed and stable  Last Vitals:  Vitals Value Taken Time  BP    Temp    Pulse 76 03/30/22 1103  Resp 23 03/30/22 1103  SpO2 100 % 03/30/22 1103  Vitals shown include unvalidated device data.  Last Pain:  Vitals:   03/30/22 0442  TempSrc: Oral  PainSc:       Patients Stated Pain Goal: 0 (43/73/57 8978)  Complications: No notable events documented.

## 2022-03-30 NOTE — Assessment & Plan Note (Signed)
LDL remains elevated at 130mg /dl  - Consider bempedoic acid if can confirm pruritus with statin.

## 2022-03-30 NOTE — Assessment & Plan Note (Signed)
Wound exploration and debridement R groin.  Common femoral repair.  VAC in place.  No erythema on exam, foot warm, able to move.   - Continue antibiotics for 10 days.  - Maintain SBP 100-150 to protect graft, maintain perfusion down leg.

## 2022-03-30 NOTE — Anesthesia Preprocedure Evaluation (Addendum)
Anesthesia Evaluation  Patient identified by MRN, date of birth, ID band Patient awake    Reviewed: Allergy & Precautions, H&P , NPO status , Patient's Chart, lab work & pertinent test results, reviewed documented beta blocker date and time   Airway Mallampati: II  TM Distance: >3 FB Neck ROM: Full    Dental no notable dental hx. (+) Edentulous Upper, Edentulous Lower, Dental Advisory Given   Pulmonary neg pulmonary ROS, former smoker,    Pulmonary exam normal breath sounds clear to auscultation       Cardiovascular hypertension, Pt. on medications and Pt. on home beta blockers + CAD, + Past MI, + Peripheral Vascular Disease and +CHF  + Valvular Problems/Murmurs  Rhythm:Regular Rate:Normal + Systolic murmurs    Neuro/Psych  Headaches, Anxiety Depression    GI/Hepatic Neg liver ROS, GERD  Medicated,  Endo/Other  Hypothyroidism   Renal/GU Renal InsufficiencyRenal disease  negative genitourinary   Musculoskeletal  (+) Arthritis , Osteoarthritis,    Abdominal   Peds  Hematology  (+) Blood dyscrasia, anemia ,   Anesthesia Other Findings   Reproductive/Obstetrics negative OB ROS                            Anesthesia Physical Anesthesia Plan  ASA: 4  Anesthesia Plan: General   Post-op Pain Management:    Induction: Intravenous  PONV Risk Score and Plan: 4 or greater and Ondansetron, Dexamethasone and Treatment may vary due to age or medical condition  Airway Management Planned: Oral ETT  Additional Equipment:   Intra-op Plan:   Post-operative Plan: Extubation in OR  Informed Consent: I have reviewed the patients History and Physical, chart, labs and discussed the procedure including the risks, benefits and alternatives for the proposed anesthesia with the patient or authorized representative who has indicated his/her understanding and acceptance.     Dental advisory given  Plan  Discussed with: CRNA  Anesthesia Plan Comments:         Anesthesia Quick Evaluation

## 2022-03-30 NOTE — Progress Notes (Signed)
Pts ipad, cell phone, black bag, blanket and [employer] bag in pts closet in pts room.

## 2022-03-30 NOTE — Anesthesia Procedure Notes (Signed)
Procedure Name: Intubation Date/Time: 03/30/2022 8:10 AM Performed by: Valda Favia, CRNA Pre-anesthesia Checklist: Patient identified, Emergency Drugs available, Suction available and Patient being monitored Patient Re-evaluated:Patient Re-evaluated prior to induction Oxygen Delivery Method: Circle System Utilized Preoxygenation: Pre-oxygenation with 100% oxygen Induction Type: IV induction Ventilation: Mask ventilation without difficulty Laryngoscope Size: Mac and 4 Grade View: Grade I Tube type: Oral Tube size: 7.0 mm Number of attempts: 1 Airway Equipment and Method: Stylet and Oral airway Placement Confirmation: ETT inserted through vocal cords under direct vision, positive ETCO2 and breath sounds checked- equal and bilateral Secured at: 20 cm Tube secured with: Tape Dental Injury: Teeth and Oropharynx as per pre-operative assessment

## 2022-03-30 NOTE — Plan of Care (Signed)

## 2022-03-30 NOTE — Progress Notes (Signed)
Occupational Therapy Discharge Note  This patient was unable to complete the inpatient rehab program due to needing to go back to acute for infection; therefore did not meet their long term goals. Pt left the program at a min to mod  assist level for their  functional ADLs. This patient is being discharged from OT services at this time.  BIMS at time of d/c  Pt unable to complete due to medical status  See CareTool for functional status details.  If the patient is able to return to inpatient rehabilitation within 3 midnights, this may be considered an interrupted stay and therapy services will resume as ordered. Modification and reinstatement of their goals will be made upon completion of therapy service reevaluations.

## 2022-03-30 NOTE — Assessment & Plan Note (Signed)
HB down to 6.3, due to combination of operative loss and hemodilution.   - Transfuse 2 units PRBC - Follow HB.

## 2022-03-30 NOTE — Consult Note (Signed)
Hospital Consult  Pt seen and examined this morning. Doing well. No complaints, denies fever chills.  After discussing the risk and benefits of right groin washout, debridement, Shaunita elected to proceed.  Broadus John MD    Reason for Consult: Right groin infection Requesting Physician: Dr. Dagoberto Ligas MRN #:  [id number]  History of Present Illness: This is a 81 y.o. female currently in inpatient rehab after recent hospitalization at [facility] status post 03/17/2022 bilateral femoral endarterectomy, iliac stenting.  This was complicated by postprocedural cardiac arrest - NSTEMI.  Intervention was deferred due to bilateral groin wounds.  Douglas was progressing well in rehab, however appreciated some malaise over the last 3 days.  Leukocytosis recently spiked 16 from 8.  On physical exam, there was serous fluid draining from the right groin incision.  On further examination, there was concern for necrosis.  Vascular surgery was consulted for further recommendations.  On exam, Khai was resting comfortably.  A [place] native, she has lived there for most of her life, with short stents and [place] and [place].  Both her son and daughter-in-law RN from [place] due to her recent hospitalization.  She denies fevers, chills but has appreciated malaise for the last 3 days.  She denies odor, but has had significant drainage from the right groin.  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 b.) TTE 11/16/2021: EF >55%; mild AS (MPG 9 mmHg)   CAD (coronary artery disease)    a.) s/p  3v CABG 03/29/2000   Carotid artery stenosis    a.) s/p LEFT CEA 09/09/2003. b.) Carotid doppler 71/21/9758: 8-32% LICA, CTO RICA; subclavian stenosis   Cataracts, bilateral    Cervical spondylosis without myelopathy    Chronic diastolic CHF (congestive heart failure), NYHA class 3 (HCC)    a.) TTE 05/27/2016: EF >55%, mild LA enlargement, triv PR, mild MR, mod TR; G3DD. b.) TTE 12/12/2017: EF >55%, mild LVH, BAE, mild MR/PR, mod TR; RVSP 52.8 mmHg. c.) TTE 03/18/2020: EF >55%, BAD, AS (MPG 8.7 mmHg); triv MR, mild TR/PR. d.) TTE 11/16/2021: EF >55%, LVH, G1DD, triv MR, mild PR, mod TR; AS (MPG 9 mmHg); MS (MPG 5 mmHg)   Chronic kidney disease, stage III (moderate) (HCC)    Chronic narcotic use 06/24/2014   Chronic pain syndrome    GERD (gastroesophageal reflux disease)    History of 2019 novel coronavirus disease (COVID-19) 09/22/2021   Hyperlipidemia    Hypertension    Long term current use of antithrombotics/antiplatelets    a.) on daily DAPT therapy (ASA + clopidogrel)   Lumbar stenosis with neurogenic claudication    Mitral stenosis 11/16/2021   a.) TTE 11/16/2021: EF >55%; mod MS (MPG 5 mmHg)   Osteoarthritis of hip    Pulmonary hypertension ([place]) 12/12/2017   a.) TTE 12/12/2017: mild; RVSP 52.8 mmHg   PVD (peripheral vascular disease) (HCC)    Renal artery stenosis (HCC)    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    Past Surgical History:  Procedure Laterality Date   ABDOMINAL HYSTERECTOMY  1976   CAROTID ARTERY ANGIOPLASTY Left    CAROTID ENDARTERECTOMY Left 09/09/2003   Procedure: CAROTID ENDARTERECTOMY; Location: [facility]; Surgeon: Maura Crandall, MD   COLONOSCOPY WITH PROPOFOL N/A 08/16/2017   Procedure: COLONOSCOPY WITH PROPOFOL;  Surgeon: [name], MD;   Location: [facility] ENDOSCOPY;  Service: Endoscopy;  Laterality: N/A;   CORONARY ANGIOPLASTY WITH STENT PLACEMENT  2000   CORONARY ARTERY BYPASS GRAFT N/A 03/29/2000   Procedure: 3v CORONARY ARTERY BYPASS GRAFT; Location: [facility]   CYSTOSCOPY WITH STENT PLACEMENT Bilateral    ENDARTERECTOMY FEMORAL Bilateral 03/17/2022   Procedure: ENDARTERECTOMY FEMORAL ( BILATERAL SFA STENT);  Surgeon: Katha Cabal, MD;  Location: [facility] ORS;  Service: Vascular;  Laterality: Bilateral;   ESOPHAGOGASTRODUODENOSCOPY (EGD) WITH PROPOFOL N/A 08/16/2017   Procedure: ESOPHAGOGASTRODUODENOSCOPY (EGD) WITH PROPOFOL;  Surgeon: [name], MD;  Location[facility] ENDOSCOPY;  Service: Endoscopy;  Laterality: N/A;   ESOPHAGOGASTRODUODENOSCOPY (EGD) WITH PROPOFOL N/A 06/29/2018   Procedure: ESOPHAGOGASTRODUODENOSCOPY (EGD) WITH PROPOFOL;  Surgeon: Virgel Manifold, MD;  Location: [facility] ENDOSCOPY;  Service: Endoscopy;  Laterality: N/A;   INSERTION OF ILIAC STENT Left 03/17/2022   Procedure: INSERTION OF ILIAC STENT;  Surgeon: Katha Cabal, MD;  Location: [facility] ORS;  Service: Vascular;  Laterality: Left;   LAPAROSCOPIC CHOLECYSTECTOMY Left 10/26/1999   Procedure: LAPAROSCOPIC CHOLECYSTECTOMY; Location: [facility]; Surgeon: Rochel Brome, MD   LEFT HEART CATH AND CORONARY ANGIOGRAPHY N/A 11/18/2021   Procedure: LEFT HEART CATH AND CORONARY ANGIOGRAPHY;  Surgeon: Yolonda Kida, MD;  Location: [place] CV [facility];  Service: Cardiovascular;  Laterality: N/A;   LEFT HEART CATH AND CORS/GRAFTS ANGIOGRAPHY Left 11/19/2002   Procedure: LEFT HEART CATH AND CORS/GRAFTS ANGIOGRAPHY; Location: [place]; Surgeon: Katrine Coho, MD   LEFT HEART CATH AND CORS/GRAFTS ANGIOGRAPHY Left 09/17/2003   Procedure: LEFT HEART CATH AND CORS/GRAFTS ANGIOGRAPHY; Location: [place]; Surgeon: Katrine Coho, MD   LOWER EXTREMITY ANGIOGRAPHY Right 01/06/2022   Procedure: Lower Extremity Angiography;  Surgeon: Katha Cabal, MD;  Location: [place]  CV LAB;  Service: Cardiovascular;  Laterality: Right;   RENAL ARTERY ANGIOPLASTY Bilateral 12/2013   TOE AMPUTATION Right    small toe   TONSILLECTOMY AND ADENOIDECTOMY     TOTAL HIP ARTHROPLASTY Left 2005   TOTAL HIP ARTHROPLASTY Right 2015   UPPER EXTREMITY ANGIOGRAPHY Left 11/23/2021   Procedure: UPPER EXTREMITY ANGIOGRAPHY;  Surgeon: Algernon Huxley, MD;  Location: [place] CV [facility];  Service: Cardiovascular;  Laterality: Left;    Allergies  Allergen Reactions   Citalopram Anaphylaxis    Throat closing    Dilaudid [Hydromorphone Hcl] Nausea And Vomiting   Hydrochlorothiazide Other (See Comments)    Decreased GFR (Nov 2015)   Liothyronine     Hair fell out, caused headaches    Nsaids     CKD stage III - avoid nephrotoxic drugs   Nubain [Nalbuphine Hcl]     Burning sensation in back   Penicillins Itching   Prasugrel Itching   Statins Itching    Prior to Admission medications   Medication Sig Start Date End Date Taking? Authorizing Provider  acetaminophen (TYLENOL) 325 MG tablet Take 1-2 tablets (325-650 mg total) by mouth every 4 (four) hours as needed for mild pain (or temp >/= 101 F). 03/29/22   Angiulli, Lavon Paganini, PA-C  amLODipine (NORVASC) 10  MG tablet Take 1 tablet (10 mg total) by mouth daily. 03/30/22   Angiulli, Lavon Paganini, PA-C  aspirin 81 MG tablet Take 81 mg by mouth daily.    [provider]  clopidogrel (PLAVIX) 75 MG tablet Take 1 tablet (75 mg total) by mouth daily. To prevent strokes 01/04/22   Crecencio Mc, MD  docusate sodium (COLACE) 100 MG capsule Take 1 capsule (100 mg total) by mouth daily. 03/30/22   Angiulli, Lavon Paganini, PA-C  ezetimibe (ZETIA) 10 MG tablet Take 1 tablet (10 mg total) by mouth daily. 01/11/22   Crecencio Mc, MD  ferrous sulfate 325 (65 FE) MG tablet Take 1 tablet (325 mg total) by mouth daily with breakfast. 03/30/22   Angiulli, Lavon Paganini, PA-C  labetalol (NORMODYNE) 300 MG tablet Take 1 tablet (300 mg total) by mouth 2 (two) times  daily. For hypertension 03/05/22   Crecencio Mc, MD  nortriptyline (PAMELOR) 10 MG capsule Take 1 capsule (10 mg total) by mouth 2 (two) times daily. For chronic headaches 12/28/21   Crecencio Mc, MD  oxyCODONE-acetaminophen (PERCOCET/ROXICET) 5-325 MG tablet Take 1-2 tablets by mouth every 4 (four) hours as needed for moderate pain. 03/29/22   Angiulli, Lavon Paganini, PA-C  ranolazine (RANEXA) 500 MG 12 hr tablet Take 1 tablet (500 mg total) by mouth 2 (two) times daily. 12/28/21   Crecencio Mc, MD  spironolactone (ALDACTONE) 25 MG tablet Take 1 tablet (25 mg total) by mouth daily. Patient not taking: Reported on 03/24/2022 03/25/22   Sharen Hones, MD    Social History   Socioeconomic History   Marital status: Divorced    Spouse name: Not on file   Number of children: 3   Years of education: Not on file   Highest education level: Not on file  Occupational History   Occupation: retired  Tobacco Use   Smoking status: Former    Types: Cigarettes    Quit date: 10/25/1998    Years since quitting: 23.4   Smokeless tobacco: Never  Vaping Use   Vaping Use: Never used  Substance and Sexual Activity   Alcohol use: No    Alcohol/week: 0.0 standard drinks   Drug use: Never   Sexual activity: Not Currently  Other Topics Concern   Not on file  Social History Narrative   Daughter [name] ([place]); 1 in [employer]; 1 in [place]   Lives alone   Social Determinants of Health   Financial Resource Strain: Not on file  Food Insecurity: Not on file  Transportation Needs: Not on file  Physical Activity: Not on file  Stress: Not on file  Social Connections: Not on file  Intimate Partner Violence: Not on file   Family History  Problem Relation Age of Onset   Stroke Mother    Hypertension Mother    Diabetes Mother    Hypertension Father    Heart disease Sister        MI   Multiple sclerosis Daughter    Multiple sclerosis Son    Cerebral aneurysm Son    Seizures Son    Cerebral aneurysm Son     Breast cancer Paternal Aunt 21    ROS: Otherwise negative unless mentioned in HPI  Physical Examination  Vitals:   03/30/22 0433 03/30/22 0442  BP: (!) 155/54 (!) 155/54  Pulse: 61 61  Resp: 16 16  Temp: 98.3 F (36.8 C) 98.3 F (36.8 C)  SpO2: 97%    Body mass index  is 30.49 kg/m.  General:  WDWN in NAD Gait: Not observed HENT: WNL, normocephalic Pulmonary: normal non-labored breathing, without Rales, rhonchi,  wheezing Cardiac: regular, Abdomen: soft, NT/ND, no masses Skin: without rashes Vascular Exam: Bilateral groins with longitudinal incisions, right-sided with necrosis, nonviable tissue, draining.  Left side no drainage Extremities: without ischemic changes, without Gangrene , without cellulitis; without open wounds;  Musculoskeletal: no muscle wasting or atrophy  Neurologic: A&O X 3;  No focal weakness or paresthesias are detected; speech is fluent/normal Psychiatric:  The pt has Normal affect. Lymph:  Unremarkable  CBC    Component Value Date/Time   WBC 15.3 (H) 03/30/2022 0343   RBC 2.93 (L) 03/30/2022 0343   HGB 8.6 (L) 03/30/2022 0343   HGB 9.2 (L) 04/13/2014 0952   HCT 26.2 (L) 03/30/2022 0343   HCT 24.7 (L) 04/12/2014 1143   PLT 391 03/30/2022 0343   PLT 182 04/11/2014 0558   MCV 89.4 03/30/2022 0343   MCV 85 03/27/2014 0944   MCH 29.4 03/30/2022 0343   MCHC 32.8 03/30/2022 0343   RDW 13.4 03/30/2022 0343   RDW 13.7 03/27/2014 0944   LYMPHSABS 1.7 03/30/2022 0343   MONOABS 0.9 03/30/2022 0343   EOSABS 0.5 03/30/2022 0343   BASOSABS 0.0 03/30/2022 0343    BMET    Component Value Date/Time   NA 136 03/30/2022 0343   NA 139 09/13/2014 0000   NA 134 (L) 04/12/2014 0558   K 3.9 03/30/2022 0343   K 4.4 04/12/2014 0558   CL 109 03/30/2022 0343   CL 101 04/12/2014 0558   CO2 22 03/30/2022 0343   CO2 27 04/12/2014 0558   GLUCOSE 100 (H) 03/30/2022 0343   GLUCOSE 90 04/12/2014 0558   BUN 24 (H) 03/30/2022 0343   BUN 18 09/13/2014 0000    BUN 16 04/12/2014 0558   CREATININE 1.90 (H) 03/30/2022 0343   CREATININE 1.12 04/12/2014 0558   CALCIUM 8.8 (L) 03/30/2022 0343   CALCIUM 8.5 04/12/2014 0558   GFRNONAA 26 (L) 03/30/2022 0343   GFRNONAA 49 (L) 04/12/2014 0558   GFRAA 50 (L) 03/10/2020 1311   GFRAA 57 (L) 04/12/2014 0558    COAGS: Lab Results  Component Value Date   INR 1.0 03/27/2014     Non-Invasive Vascular Imaging:   -    ASSESSMENT/PLAN: This is a 81 y.o. female with postoperative right groin infection.  She would benefit from incision and debridement tomorrow in an effort to ensure the previously placed patch is not involved.  I plan to resect nonviable tissue at this time.  After discussing the risk and benefits, Anabel elected to proceed.   Please make n.p.o. at midnight   [name] MD [facility] [phone] 03/30/2022  7:28 AM

## 2022-03-30 NOTE — Assessment & Plan Note (Signed)
-   Continue home nortriptyline (did not tolerate SSRI)

## 2022-03-30 NOTE — Anesthesia Procedure Notes (Signed)
Arterial Line Insertion Start/End6/03/2022 8:40 AM, 03/30/2022 8:50 AM Performed by: Roderic Palau, MD, Griffin Dakin, CRNA, CRNA  Patient location: Pre-op. Preanesthetic checklist: patient identified, IV checked, site marked, risks and benefits discussed, surgical consent, monitors and equipment checked, pre-op evaluation, timeout performed and anesthesia consent Lidocaine 1% used for infiltration Left, radial was placed Catheter size: 20 G Hand hygiene performed  and maximum sterile barriers used   Attempts: 1 Procedure performed without using ultrasound guided technique. Following insertion, dressing applied and Biopatch. Post procedure assessment: normal and unchanged  Patient tolerated the procedure well with no immediate complications.

## 2022-03-30 NOTE — OR Nursing (Signed)
11 Rings and one gold bracelet taken from patient and sent to security.  [employer] from security accepted items with invoice [phone]

## 2022-03-30 NOTE — Progress Notes (Incomplete)
PROGRESS NOTE  Sabrina [name]  DOB: 02-Jul-1941  PCP: Crecencio Mc, MD TGG:[id number]  DOA: 03/29/2022  LOS: 1 day  Hospital Day: 2  Brief narrative: Sabrina [name] is a 81 y.o. female with PMH significant for HTN, HLD, CAD s/p CABG x3, diastolic CHF, CKD stage III, and PAD s/p multiple revascularization procedures.   5/24-5/31, patient was electively admitted at [facility] by vascular surgery 5/24, underwent bilateral superior femoral artery and left iliac stenting due to debilitating claudication with rest pain. Following the procedure patient developed acute respiratory failure with cardiac arrest requiring 2 minutes of CPR before ROSC.  She was temporarily intubated.  High-sensitivity troponins were noted to be elevated up to 15,000. Echocardiogram noted EF to be 60-65% without wall motion abnormalities.  She was seen by cardiology and placed on aspirin, Plavix, Ranexa.   5/29, bilateral groaning dressings and wound vac were removed.  5/30, wound cultures grew out moderate Proteus mirabilis and rare Klebsiella pneumoniae.  It appears that patient was not treated for these findings.   5/31, patient was discharged to inpatient rehab. In few days, patient started having nausea, vomiting, diarrhea as well as continuous drainage from the right inguinal surgical wound.  She was noted to have progressive increasing leukocytosis as well as creatinine.   While in rehab, blood culture was ordered and vascular surgery was consulted due to concern for infection of the surgical wound.  6/5, Per recommendation from vascular surgery, patient was admitted to hospital for potential need of surgical intervention.   6/6, patient underwent incision and debridement of right groin wound, arterial patch was excised and replaced by venous patch, wound VAC was placed. Patient is currently being monitored in ICU postoperatively.   Subjective: Patient was seen and examined ***  Principal Problem:    Postoperative wound infection Active Problems:   Acute kidney injury superimposed on chronic kidney disease (HCC)   HLD (hyperlipidemia)   Peripheral vascular disease (HCC)   Generalized anxiety disorder   Acute blood loss anemia   Myocardial infarction due to demand ischemia (HCC)    Assessment and plan: Postoperative wound infection -Underwent exploration and debridement of right groin wound and wound VAC placement by vascular surgery.  -Currently on IV Zosyn. -Pending wound culture report.   -WBC count improving -Continue to trend. Recent Labs  Lab 03/25/22 0524 03/29/22 0616 03/29/22 1210 03/30/22 0343  WBC 8.1 15.6* 20.1* 15.3*       Patient reported persistent drainage from the right inguinal wound from recent surgical procedure on 5/24.  White blood cell count elevated up to 20.1.  At this time it appears she remains afebrile.  Records note wound cultures had been obtained on 5/30 which grew moderate Proteus mirabilis and rare Klebsiella pneumoniae.  Blood cultures have been obtained today prior to transfer and she was started on cephalexin p.o.  Vascular surgery have been consulted. -Admit to telemetry bed -Follow-up blood cultures -Changed to Rocephin and metronidazole IV -Recheck CBC tomorrow morning -Appreciate vascular surgery consultative services, will follow-up for further recommendations.   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  She had been started on normal saline IV fluids at 50 mL/h due to reports of poor p.o. intake with nausea, vomiting, and diarrhea.  Review of records note she also appeared to be on diuretics of spironolactone  25 mg daily.  -Monitor intake and output -Held  spironolactone -Bolus 500 mL of normal saline IV fluids, then placed on a rate of 75 mL/h -Recheck kidney function tomorrow   Nausea, vomiting, and diarrhea In addition  to nausea and vomiting patient had reported having 3 bowel movements per day.  She had been on Colace 100 mg daily. -Monitor intake and output -Check acute abdominal series -Held Colace.  Follow-up abdominal series and determine if alternative regimen needs to be given   PAD s/p bilateral lower extremity endarterectomy Recent postop ardiac arrest secondary to NSTEMI Hyperlipidemia Condition developed after vascular intervention on 5/24.  Patient was treated in the ICU and required intubation.  Cardiac arrest thought secondary to acute culinary syndrome with NSTEMI.  Patient is unable to tolerate statins.  Plan is for outpatient follow-up with cardiology to evaluate other -Continue dual antiplatelet therapy and oxycodone as needed for pain   Anemia of chronic disease Hemoglobin 9.9 g/dL which appears near patient's baseline. -Continue to monitor   Anxiety and depression -Continue nortriptyline   Goals of care   Code Status: Full Code  ***  Mobility: ***  Skin assessment: ***    Nutritional status: *** Body mass index is 30.49 kg/m.       {Tip this will not be part of the note when signed Body mass index is 30.49 kg/m. , ,  (Optional):26781}   Diet: *** Diet Order             Diet clear liquid Room service appropriate? Yes; Fluid consistency: Thin  Diet effective now                   DVT prophylaxis:***  SCDs Start: 03/30/22 1201 Place TED hose Start: 03/29/22 1540   Antimicrobials: *** Fluid: *** Consultants: *** Family Communication: ***  Status is: ***  Continue in-hospital care because: *** Level of care: ICU   Dispo: The patient is from: ***              Anticipated d/c is to: ***              Patient currently {Medically stable:23817}   Difficult to place patient {Yes/No:25151}     Infusions:   piperacillin-tazobactam (ZOSYN)  IV 3.375 g (03/30/22 1440)    Scheduled Meds:  sodium chloride   Intravenous Once   sodium chloride    Intravenous Once   amLODipine  10 mg Oral Daily   aspirin  81 mg Oral Daily   [START ON 03/31/2022] Chlorhexidine Gluconate Cloth  6 each Topical Daily   clopidogrel  75 mg Oral Daily   ezetimibe  10 mg Oral Daily   fentaNYL       fentaNYL       ferrous sulfate  325 mg Oral Q breakfast   labetalol       labetalol  300 mg Oral BID   mupirocin ointment  1 application. Nasal BID   nortriptyline  10 mg Oral BID   ranolazine  500 mg Oral BID    PRN meds: acetaminophen **OR** acetaminophen, alum & mag hydroxide-simeth, docusate sodium, labetalol, oxyCODONE-acetaminophen, polyethylene glycol   Antimicrobials: Anti-infectives (From admission, onward)    Start     Dose/Rate Route Frequency Ordered Stop   03/30/22 1445  piperacillin-tazobactam (ZOSYN) IVPB 3.375 g        3.375 g 12.5 mL/hr over 240 Minutes Intravenous Every 8 hours 03/30/22 1351     03/30/22 0958  ceFAZolin 1 g / gentamicin 80 mg in NS 500 mL surgical  irrigation  Status:  Discontinued          As needed 03/30/22 0959 03/30/22 1108   03/30/22 0815  piperacillin-tazobactam (ZOSYN) IVPB 3.375 g        3.375 g 100 mL/hr over 30 Minutes Intravenous  Once 03/30/22 0813 03/30/22 0852   03/29/22 1800  cefTRIAXone (ROCEPHIN) 2 g in sodium chloride 0.9 % 100 mL IVPB  Status:  Discontinued        2 g 200 mL/hr over 30 Minutes Intravenous Every 24 hours 03/29/22 1550 03/30/22 1351   03/29/22 1800  metroNIDAZOLE (FLAGYL) IVPB 500 mg  Status:  Discontinued        500 mg 100 mL/hr over 60 Minutes Intravenous Every 12 hours 03/29/22 1550 03/30/22 1351       Objective: Vitals:   03/30/22 1500 03/30/22 1530  BP:    Pulse: 74 75  Resp: 19 20  Temp:    SpO2: 100% 100%    Intake/Output Summary (Last 24 hours) at 03/30/2022 1613 Last data filed at 03/30/2022 1500 Gross per 24 hour  Intake 2199 ml  Output 1400 ml  Net 799 ml   Filed Weights   03/30/22 0433 03/30/22 0442  Weight: 73.2 kg 73.2 kg   Weight change:  Body mass  index is 30.49 kg/m.   Physical Exam: General exam: *** Skin: No rashes, lesions or ulcers. HEENT: Atraumatic, normocephalic, no obvious bleeding Lungs: *** CVS: *** GI/Abd *** CNS: *** Psychiatry: *** Extremities: ***  Data Review: I have personally reviewed the laboratory data and studies available.  F/u labs *** Unresulted Labs (From admission, onward)     Start     Ordered   03/31/22 0500  CBC  Tomorrow morning,   R        03/30/22 1202   03/31/22 1610  Basic metabolic panel  Tomorrow morning,   R        03/30/22 1202   03/31/22 0500  Magnesium  Tomorrow morning,   R        03/30/22 1202   03/31/22 0500  Phosphorus  Tomorrow morning,   R        03/30/22 1202   03/31/22 0500  Protime-INR  Tomorrow morning,   R        03/30/22 1202   03/31/22 0500  APTT  Tomorrow morning,   R        03/30/22 1202   03/31/22 0500  Fibrinogen  Tomorrow morning,   R        03/30/22 1202   03/30/22 1800  CBC  Every 6 hours (unscheduled),   R (with TIMED occurrences)      03/30/22 1209   03/30/22 9604  Basic metabolic panel  Once,   R        03/30/22 1530   03/30/22 1530  Magnesium  Once,   R        03/30/22 1530   03/30/22 1530  Hemoglobin and hematocrit, blood  Once,   R        03/30/22 1530   03/30/22 1530  Lactic acid, plasma  Once,   STAT        03/30/22 1530   03/30/22 0850  Aerobic/Anaerobic Culture w Gram Stain (surgical/deep wound)  RELEASE UPON ORDERING,   TIMED       Comments: Specimen A: Phone [phone]         Previous Biopsy:  no Is the patient on airborne/droplet precautions? No  Clinical History:  N/A Copy of Report to:  Unice Bailey, MD Specimen Disposition: OR Specimen Holding     03/30/22 0850            Signed, Terrilee Croak, MD [facility] 03/30/2022

## 2022-03-30 NOTE — Anesthesia Postprocedure Evaluation (Signed)
Anesthesia Post Note  Patient: Sabrina [name]  Procedure(s) Performed: RIGHT GROIN IRRIGATION & DEBRIDEMENT (Right: Groin) RE-EXPOSURE OF RIGHT COMMON FEMORAL ARTERY, RE-DO RIGHT FEMORAL ENDARTERECTOMY WITH VEIN PATCH (Right: Groin)     Patient location during evaluation: PACU Anesthesia Type: General Level of consciousness: awake and alert Pain management: pain level controlled Vital Signs Assessment: post-procedure vital signs reviewed and stable Respiratory status: spontaneous breathing, nonlabored ventilation, respiratory function stable and patient connected to nasal cannula oxygen Cardiovascular status: blood pressure returned to baseline and stable Postop Assessment: no apparent nausea or vomiting Anesthetic complications: no   No notable events documented.  Last Vitals:  Vitals:   03/30/22 1210 03/30/22 1225  BP: (!) 143/60 (!) 119/44  Pulse: 71 72  Resp: 16 18  Temp:    SpO2: 100% 100%    Last Pain:  Vitals:   03/30/22 1115  TempSrc: Oral  PainSc:                  Reef Achterberg,W. EDMOND

## 2022-03-30 NOTE — Progress Notes (Signed)
Pharmacy Antibiotic Note  Sabrina [name] is a 81 y.o. female admitted on 03/29/2022 with  R groin infection s/p I/D .  Pharmacy has been consulted for zosyn dosing.  Allergy to PCN-itching, tolerated zosyn intra-op 5/30 wound culture + kleb pneumo, proteus mirabilis (sensitive to all, but ampicillin)  Plan: Will escalate ceftriaxone/flagyl to zosyn pending new OR wound culture data.  Plan to monitor for signs of allergic reaction and can escalate to meropenem if needed Will monitor for acute changes in renal function and adjust as needed F/u cultures results and de-escalate as appropriate   Height: 5\' 1"  (154.9 cm) Weight: 73.2 kg (161 lb 6 oz) IBW/kg (Calculated) : 47.8  Temp (24hrs), Avg:98.2 F (36.8 C), Min:97.7 F (36.5 C), Max:98.9 F (37.2 C)  Recent Labs  Lab 03/25/22 0524 03/26/22 0540 03/27/22 0614 03/29/22 0616 03/29/22 1210 03/30/22 0343  WBC 8.1  --   --  15.6* 20.1* 15.3*  CREATININE 1.89* 1.77* 1.89* 1.91* 2.14* 1.90*    Estimated Creatinine Clearance: 21.6 mL/min (A) (by C-G formula based on SCr of 1.9 mg/dL (H)).    Allergies  Allergen Reactions   Citalopram Anaphylaxis    Throat closing    Dilaudid [Hydromorphone Hcl] Nausea And Vomiting   Hydrochlorothiazide Other (See Comments)    Decreased GFR (Nov 2015)   Liothyronine     Hair fell out, caused headaches    Nsaids     CKD stage III - avoid nephrotoxic drugs   Nubain [Nalbuphine Hcl]     Burning sensation in back   Penicillins Itching   Prasugrel Itching   Statins Itching    Thank you for allowing pharmacy to be a part of this patient's care.  Donnald Garre, PharmD Clinical Pharmacist  Please check AMION for all [facility] numbers After 10:00 PM, call [facility] [phone]

## 2022-03-30 NOTE — Progress Notes (Signed)
Attempted to call pt daughter [name] . There was no answer. Had left message letting her know her mother is being moved to ICU unit post surgical procedure.

## 2022-03-30 NOTE — Assessment & Plan Note (Signed)
Perioperative MI complicated prior surgery. EF normal after (demand-related type 2 infarction)  Currently warm and well perfused. No chest pain. HS normal ECG shows new inferior Q's  - Trend troponin.  - Restart ranolazine, labetalol post-operatively - Restart ASA and ezetimibe.

## 2022-03-30 NOTE — Assessment & Plan Note (Signed)
Currently feet warm.   - Maintain SBP 100-150.

## 2022-03-30 NOTE — Assessment & Plan Note (Signed)
No dye load today.   Likely volume contracted from poor PO intake for past 3 days.   - Correct anemia.  - Judicious volume resuscitation.  - Follow creatinine - Trend lactate

## 2022-03-30 NOTE — Op Note (Signed)
NAME: Sabrina [name]    MRN: [id number] DOB: 06/04/1941    DATE OF OPERATION: 03/30/2022  PREOP DIAGNOSIS:    Postoperative right groin wound infection  POSTOP DIAGNOSIS:    Same  PROCEDURE:    Incision and debridement of the right groin wound Reexposure of the common femoral artery Common femoral artery patch excision Common femoral patch plasty using ipsilateral greater saphenous vein Myriad Morcel placement VAC placement  SURGEON: Broadus John  ASSIST: Roxy Horseman, PA  ANESTHESIA: General  EBL: 300 mL  INDICATIONS:   Sabrina [name] is a 81 y.o. female seen in consultation yesterday in inpatient rehab after recent hospitalization at [facility] status post 03/17/2022 bilateral femoral endarterectomy, iliac stenting.  This was complicated by postprocedural cardiac arrest - NSTEMI.  Intervention was deferred due to bilateral groin wounds.  On exam postoperative right groin infection was appreciated.  After discussing the risk and benefits of right-sided incision debridement in an effort to ensure the previously placed patch is not involved, it was elected to proceed.  FINDINGS:   Purulence appreciated at the base of the wound bed involving the right bovine pericardial patch  TECHNIQUE:   Patient was brought to the OR laid in the supine position.  General anesthesia was induced and the patient was prepped and draped in standard fashion.  The case began with staple removal and suture removal at the area of necrotic tissue.  Upon doing this, purulence was appreciated in the base of the wound bed.  All staples were removed, and the wound opened.  Cultures were taken of the purulence, and at the base of the wound bed the common femoral artery patch was appreciated bathing in the purulence.  I was concerned the patch was infected, and elected to remove the bovine pericardial patch and replace with ipsilateral greater saphenous vein.  Proximal and distal control of the  common femoral artery was obtained with Vesseloops.  This proved challenging proximally due to significant inflammation involving the distal right iliac artery.  Once proximal and distal control were obtained, I used an ultrasound to insonate the right greater saphenous vein.  A 10 cm incision was made and greater saphenous vein localized.  Saphenectomy followed in standard fashion. Next, the patient was heparinized, artery clamped, and patch resected.  All suture was also removed.  Next, the greater saphenous vein was opened longitudinally to create a patch for the right common femoral artery.  This was sewn in using 5-0 running Prolene suture in standard fashion.  Runoff prior to the patch plasty was profunda- only, as judged by backbleeding.  Prior to completion, arteries were backbled to ensure there was no thrombotic burden.  On completion, a Doppler ultrasound was brought into the field demonstrating excellent signals both proximally and distally in the profunda.  There was raw surface bleeding, that was improved with the use of protamine.  I elected to mobilize the sartorius in an effort to close the wound.  I could not mobilize the entirety of the muscle but was able to cover the common femoral artery.  The groin was closed in layers with 2-0 Vicryl.  The adipose layer and dermis were left open for incisional VAC.  Black sponge was used.  VAC measurement 12 x 2 cmx1cm.  At completion there was a dopplerable dorsalis pedis signal   Given the complexity of the case,  the assistant was necessary in order to expedient the procedure and safely perform the technical aspects of  the operation.  The assistant provided traction and countertraction to assist with exposure of the artery proximally and distally. Their assistance was critical in the performance of the patch plasty.These skills, especially following the Prolene suture for the anastomosis, could not have been adequately performed by a scrub tech  assistant.    Macie Burows, MD [facility]  DATE OF DICTATION:   03/30/2022

## 2022-03-30 NOTE — Consult Note (Addendum)
NAME:  Sabrina [name], MRN:  [id number], DOB:  Oct 11, 1941, LOS: 1 ADMISSION DATE:  03/29/2022 CONSULTATION DATE:  03/30/2022 REFERRING MD:  Pietro Cassis - [facility] CHIEF COMPLAINT:  R groin infection, postoperative   History of Present Illness:  81 year old woman who presented to [facility] from inpatient rehab 6/5 for R groin infection s/p recent bilateral femoral endarterectomy (5/24) with hospitalization at [facility]. Initial postoperative course c/b postprocedural bradycardic cardiac arrest (requiring CPR x 2 mins and Epi) and NSTEMI.  PMHx significant for HTN, HLD, CAD (s/p 3v-CABG 2001), CAS/PVD (s/p L CEA 2004), abdominal aortic ectasia, mild AS, chronic diastolic CHF (Echo 04/8675 with EF > 55%, G1DD), pulmonary HTN, CKD stage III, cervical spondylosis.  Patient reported several days of malaise PTA with increasing leukocytosis (16 from 8). She was seen by [facility] (Dr. Virl Cagey) 6/5 with concern for postoperative R groin infection, due to draining serous fluid from groin incision. Taken to OR 6/6 for irrigation and debridement. Intraoperative course was notable for frank pus surrounding the prior endarterectomy site with concern for patch infection (existing patch removed and replaced with new GSV patch). EBL 324mL. [place] placed postoperatively. Required Neo-Synephrine for BP support throughout the case. Postoperatively, Hgb 6.5 noted and 1U PRBC transfused.   PCCM consulted postoperatively for ICU admission.  Pertinent Medical History:   Past Medical History:  Diagnosis Date   Abdominal aortic ectasia ([place]) 07/13/2017   a.) Surveillance measurements: 2.6 cm ([place] 07/13/2017), 2.9 cm ([place] 09/04/2017), 2.9 cm ([place] 09/14/2018), 2.9 cm ([place] 10/03/2019), 2.6 cm ([place] 04/07/2020)   Amputation of fifth toe, right, traumatic, subsequent encounter ([place]) 06/18/2019   Anemia of chronic kidney failure    Anxiety    Aortic stenosis 03/18/2020   a.) TTE 03/18/2020: EF >55%; mild AS (MPG 8.7 mmHg). b.) TTE 11/16/2021: EF >55%; mild AS  (MPG 9 mmHg)   CAD (coronary artery disease)    a.) s/p 3v CABG 03/29/2000   Carotid artery stenosis    a.) s/p LEFT CEA 09/09/2003. b.) Carotid doppler 72/06/4708: 6-28% LICA, CTO RICA; subclavian stenosis   Cataracts, bilateral    Cervical spondylosis without myelopathy    Chronic diastolic CHF (congestive heart failure), NYHA class 3 (HCC)    a.) TTE 05/27/2016: EF >55%, mild LA enlargement, triv PR, mild MR, mod TR; G3DD. b.) TTE 12/12/2017: EF >55%, mild LVH, BAE, mild MR/PR, mod TR; RVSP 52.8 mmHg. c.) TTE 03/18/2020: EF >55%, BAD, AS (MPG 8.7 mmHg); triv MR, mild TR/PR. d.) TTE 11/16/2021: EF >55%, LVH, G1DD, triv MR, mild PR, mod TR; AS (MPG 9 mmHg); MS (MPG 5 mmHg)   Chronic kidney disease, stage III (moderate) (HCC)    Chronic narcotic use 06/24/2014   Chronic pain syndrome    GERD (gastroesophageal reflux disease)    History of 2019 novel coronavirus disease (COVID-19) 09/22/2021   Hyperlipidemia    Hypertension    Long term current use of antithrombotics/antiplatelets    a.) on daily DAPT therapy (ASA + clopidogrel)   Lumbar stenosis with neurogenic claudication    Mitral stenosis 11/16/2021   a.) TTE 11/16/2021: EF >55%; mod MS (MPG 5 mmHg)   Osteoarthritis of hip    Pulmonary hypertension ([place]) 12/12/2017   a.) TTE 12/12/2017: mild; RVSP 52.8 mmHg   PVD (peripheral vascular disease) (HCC)    Renal artery stenosis (HCC)    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   Significant Hospital Events: Including procedures, antibiotic start and stop dates in addition to other pertinent events   03/17/2022 - Bilateral femoral endarterectomy, c/f periprocedure cardiac arrest/NSTEMI, hospitalized at [facility]. Discharged to inpatient rehab. 6/5 - Malaise x 3 days, seen by [facility]  for serous wound drainage with c/f postoperative R groin wound infection. 6/6 OR for I&D of R groin wound. Required Neo throughout case. [place] placed. PCCM consulted for ICU admission postoperatively.  Interim History / Subjective:  PCCM consulted postoperatively for ICU admission C/o R groin surgical site pain WV in place Bair Hugger placed SBP 140s-160s on A-line Off of Neo  Objective:  Blood pressure (!) 155/54, pulse 61, temperature 98.3 F (36.8 C), temperature source Oral, resp. rate 16, height 5\' 1"  (1.549 m), weight 73.2 kg, SpO2 97 %.        Intake/Output Summary (Last 24 hours) at 03/30/2022 1019 Last data filed at 03/30/2022 1000 Gross per 24 hour  Intake 1426 ml  Output 800 ml  Net 626 ml   Filed Weights   03/30/22 0433 03/30/22 0442  Weight: 73.2 kg 73.2 kg   Physical Examination: General: Acutely ill-appearing elderly woman in NAD, appears uncomfortable postoperatively. HEENT: [place]/AT, anicteric sclera, PERRL, moist mucous membranes. Neuro: Awake, oriented x 4. Groaning, appears uncomfortable. Responds to verbal stimuli. Following commands consistently. Moves all 4 extremities spontaneously.  CV: RRR, +murmur II/VI heard over LUSB. PULM: Breathing even and unlabored on 2LNC. Lung fields CTAB anteriorly. GI: Soft, nontender, nondistended. Normoactive bowel sounds. Extremities: No significant LE edema noted. Skin: Cool/dry, no rashes. L groin with gauze/Tegaderm dressing over prior endarterectomy site, c/d/i without erythema/drainage. R groin prior endarterectomy site with new [place] in place with sanguineous output. No surrounding erythema/drainage. 172mL WV output since surgery.  Resolved Hospital Problem List:    Assessment & Plan:   S/p I&D of R groin endarterectomy incision S/p bilateral femoral endarterectomies 03/17/2022 History of CAS (s/p L CEA 2004) OR 6/6 for I&D. OR findings notable for frank pus surrounding the prior endarterectomy site with concern for patch  infection (existing patch removed and replaced with new GSV patch). EBL 326mL. [place] placed. - Admit to ICU for close monitoring postoperatively - Goal SBP 120-160 or MAP ~90 - Neo-Synephrine titrated to BP goals (currently off) - Postoperative wound management/pain control per [facility]VS - WV in place to R groin, -159mmHg;  monitor output - Continue Zosyn for empiric broad-spectrum antibiotic coverage - Follow OR Cx, narrow abx as able  Recent cardiac arrest, periprocedural Recent NSTEMI History of CAD s/p 3v-CABG (2001) Hyperlipidemia Patient suffered cardiac arrest 5/24 in the immediate postoperative period at [facility]; per report was sipping water, had a vagal episode with severe bradycardia and subsequent cardiac arrest. Received 2 minutes CPR and Epi x 1, reintubated. - EKG obtained in PACU unremarkable - F/u LA, ensure adequate periprocedural resuscitation - Cardiac monitoring/telemetry - Resume DAPT. statin when appropriate from postsurgical standpoint  Chronic diastolic HF Mild AS Pulmonary HTN Most recent Echo 5/25 with EF 60-65%, no RWMAs, moderate LVH, RVSP unable to be obtained.  - Monitor I&Os - Optimize volume status as able - Cardiac monitoring/tele - Low threshold for repeat Echo if significant clinical changes  History of HTN Home meds: Norvasc - Hold home antihypertensive medications at present - Resume as clinically appropriate  ABLA, postsurgical - Trend H&H Q6H, beginning 1200 - Monitor  for signs of active bleeding - Transfuse for Hgb < 8.0 (in the setting of ACS/cardiac history) - S/p 1U PRBCs 6/6 for Hgb 6.5 (postoperatively) - Monitor coags  Respiratory insufficiency, periprocedural - Continue supplemental O2 support - Wean O2 for sat > 90% - Pulmonary hygiene - Encourage mobility when appropriate postoperatively  Mild AKI on CKD stage III Baseline Cr 1.4 - 1.6. - Trend BMP - Replete electrolytes as indicated - Monitor I&Os - Avoid nephrotoxic agents as  able - Ensure adequate renal perfusion  Cervical spondylosis Chronic back pain Chronic opioid use Home regimen includes: Percocet 5-325, 1-2 tablers Q4H PRN - Postoperative pain management per [facility] - Maximize adjunct therapies as able  Physical deconditioning Presented from inpatient rehab after prolonged hospitalization for bilateral femoral endarterectomies c/b periprocedural cardiac arrest. - PT/OT as appropriate - Re-engage TOC team   Best Practice: (right click and "Reselect all SmartList Selections" daily)   Diet/type: clear liquids, ADAT to regular DVT prophylaxis: Per [facility]VS GI prophylaxis: N/A Lines: Arterial Line Foley:  N/A Code Status:  full code Last date of multidisciplinary goals of care discussion [Pending]  Labs:  CBC: Recent Labs  Lab 03/25/22 0524 03/29/22 0616 03/29/22 1210 03/30/22 0343  WBC 8.1 15.6* 20.1* 15.3*  NEUTROABS 4.7 12.2* 18.4* 12.0*  HGB 9.0* 9.5* 9.9* 8.6*  HCT 27.7* 28.9* 29.8* 26.2*  MCV 90.2 90.0 90.9 89.4  PLT 327 400 399 268   Basic Metabolic Panel: Recent Labs  Lab 03/26/22 0540 03/27/22 0614 03/29/22 0616 03/29/22 1210 03/30/22 0343  NA 134* 137 135 136 136  K 4.4 4.3 4.2 4.4 3.9  CL 103 104 106 105 109  CO2 26 26 23  20* 22  GLUCOSE 105* 103* 104* 138* 100*  BUN 24* 26* 24* 27* 24*  CREATININE 1.77* 1.89* 1.91* 2.14* 1.90*  CALCIUM 9.1 9.4 9.0 9.1 8.8*   GFR: Estimated Creatinine Clearance: 21.6 mL/min (A) (by C-G formula based on SCr of 1.9 mg/dL (H)). Recent Labs  Lab 03/25/22 0524 03/29/22 0616 03/29/22 1210 03/30/22 0343  WBC 8.1 15.6* 20.1* 15.3*   Liver Function Tests: Recent Labs  Lab 03/25/22 0524 03/29/22 0616 03/29/22 1210  AST 18 16 18   ALT 16 13 15   ALKPHOS 57 72 75  BILITOT 0.7 0.8 0.7  PROT 6.1* 6.3* 7.0  ALBUMIN 2.7* 2.8* 3.0*   No results for input(s): LIPASE, AMYLASE in the last 168 hours. No results for input(s): AMMONIA in the last 168 hours.  ABG:    Component Value Date/Time    PHART 7.36 03/17/2022 1420   PCO2ART 40 03/17/2022 1420   PO2ART 174 (H) 03/17/2022 1420   HCO3 23.2 03/17/2022 1420   ACIDBASEDEF 2.4 (H) 03/17/2022 1420   O2SAT 99.6 03/17/2022 1420    Coagulation Profile: No results for input(s): INR, PROTIME in the last 168 hours.  Cardiac Enzymes: No results for input(s): CKTOTAL, CKMB, CKMBINDEX, TROPONINI in the last 168 hours.  HbA1C: Hgb A1c MFr Bld  Date/Time Value Ref Range Status  07/19/2016 08:08 AM 5.9 4.6 - 6.5 % Final    Comment:    Glycemic Control Guidelines for People with Diabetes:Non Diabetic:  <6%Goal of Therapy: <7%Additional Action Suggested:  >8%   06/02/2015 10:05 AM 5.8 4.6 - 6.5 % Final    Comment:    Glycemic Control Guidelines for People with Diabetes:Non Diabetic:  <6%Goal of Therapy: <7%Additional Action Suggested:  >8%    CBG: Recent Labs  Lab 03/26/22 0641  GLUCAP 102*   Review  of Systems:   Patient unable to answer due to pain, peri-anesthesia.  Past Medical History:  She,  has a past medical history of Abdominal aortic ectasia ([place]) (07/13/2017), Amputation of fifth toe, right, traumatic, subsequent encounter ([place]) (06/18/2019), Anemia of chronic kidney failure, Anxiety, Aortic stenosis (03/18/2020), CAD (coronary artery disease), Carotid artery stenosis, Cataracts, bilateral, Cervical spondylosis without myelopathy, Chronic diastolic CHF (congestive heart failure), NYHA class 3 ([place]), Chronic kidney disease, stage III (moderate) ([place]), Chronic narcotic use (06/24/2014), Chronic pain syndrome, GERD (gastroesophageal reflux disease), History of 2019 novel coronavirus disease (COVID-19) (09/22/2021), Hyperlipidemia, Hypertension, Long term current use of antithrombotics/antiplatelets, Lumbar stenosis with neurogenic claudication, Mitral stenosis (11/16/2021), Osteoarthritis of hip, Pulmonary hypertension ([place]) (12/12/2017), PVD (peripheral vascular disease) ([place]), Renal artery stenosis (HCC), S/P CABG x 3  (03/29/2000), Secondary hyperparathyroidism ([place]), SOB (shortness of breath), and Subclavian arterial stenosis ([place]).   Surgical History:   Past Surgical History:  Procedure Laterality Date   ABDOMINAL HYSTERECTOMY  1976   CAROTID ARTERY ANGIOPLASTY Left    CAROTID ENDARTERECTOMY Left 09/09/2003   Procedure: CAROTID ENDARTERECTOMY; Location: [facility]; Surgeon: Maura Crandall, MD   COLONOSCOPY WITH PROPOFOL N/A 08/16/2017   Procedure: COLONOSCOPY WITH PROPOFOL;  Surgeon: [name], MD;  Location: [facility] ENDOSCOPY;  Service: Endoscopy;  Laterality: N/A;   CORONARY ANGIOPLASTY WITH STENT PLACEMENT  2000   CORONARY ARTERY BYPASS GRAFT N/A 03/29/2000   Procedure: 3v CORONARY ARTERY BYPASS GRAFT; Location: [facility]   CYSTOSCOPY WITH STENT PLACEMENT Bilateral    ENDARTERECTOMY FEMORAL Bilateral 03/17/2022   Procedure: ENDARTERECTOMY FEMORAL ( BILATERAL SFA STENT);  Surgeon: Katha Cabal, MD;  Location: [facility] ORS;  Service: Vascular;  Laterality: Bilateral;   ESOPHAGOGASTRODUODENOSCOPY (EGD) WITH PROPOFOL N/A 08/16/2017   Procedure: ESOPHAGOGASTRODUODENOSCOPY (EGD) WITH PROPOFOL;  Surgeon: [name], MD;  Location: [facility] ENDOSCOPY;  Service: Endoscopy;  Laterality: N/A;   ESOPHAGOGASTRODUODENOSCOPY (EGD) WITH PROPOFOL N/A 06/29/2018   Procedure: ESOPHAGOGASTRODUODENOSCOPY (EGD) WITH PROPOFOL;  Surgeon: Virgel Manifold, MD;  Location: [facility] ENDOSCOPY;  Service: Endoscopy;  Laterality: N/A;   INSERTION OF ILIAC STENT Left 03/17/2022   Procedure: INSERTION OF ILIAC STENT;  Surgeon: Katha Cabal, MD;  Location: [facility];  Service: Vascular;  Laterality: Left;   LAPAROSCOPIC CHOLECYSTECTOMY Left 10/26/1999   Procedure: LAPAROSCOPIC CHOLECYSTECTOMY; Location: [facility]; Surgeon: Rochel Brome, MD   LEFT HEART CATH AND CORONARY ANGIOGRAPHY N/A 11/18/2021   Procedure: LEFT HEART CATH AND CORONARY ANGIOGRAPHY;  Surgeon: Yolonda Kida, MD;  Location: [place] [facility];  Service: Cardiovascular;   Laterality: N/A;   LEFT HEART CATH AND CORS/GRAFTS ANGIOGRAPHY Left 11/19/2002   Procedure: LEFT HEART CATH AND CORS/GRAFTS ANGIOGRAPHY; Location: [facility]; Surgeon: Katrine Coho, MD   LEFT HEART CATH AND CORS/GRAFTS ANGIOGRAPHY Left 09/17/2003   Procedure: LEFT HEART CATH AND CORS/GRAFTS ANGIOGRAPHY; Location: [place]; Surgeon: Katrine Coho, MD   LOWER EXTREMITY ANGIOGRAPHY Right 01/06/2022   Procedure: Lower Extremity Angiography;  Surgeon: Katha Cabal, MD;  Location: [place] [facility];  Service: Cardiovascular;  Laterality: Right;   RENAL ARTERY ANGIOPLASTY Bilateral 12/2013   TOE AMPUTATION Right    small toe   TONSILLECTOMY AND ADENOIDECTOMY     TOTAL HIP ARTHROPLASTY Left 2005   TOTAL HIP ARTHROPLASTY Right 2015   UPPER EXTREMITY ANGIOGRAPHY Left 11/23/2021   Procedure: UPPER EXTREMITY ANGIOGRAPHY;  Surgeon: Algernon Huxley, MD;  Location: [place];  Service: Cardiovascular;  Laterality: Left;   Social History:   reports that she quit smoking about 23 years ago. Her smoking use  included cigarettes. She has never used smokeless tobacco. She reports that she does not drink alcohol and does not use drugs.   Family History:  Her family history includes Breast cancer (age of onset: 59) in her paternal aunt; Cerebral aneurysm in her son and son; Diabetes in her mother; Heart disease in her sister; Hypertension in her father and mother; Multiple sclerosis in her daughter and son; Seizures in her son; Stroke in her mother.   Allergies: Allergies  Allergen Reactions   Citalopram Anaphylaxis    Throat closing    Dilaudid [Hydromorphone Hcl] Nausea And Vomiting   Hydrochlorothiazide Other (See Comments)    Decreased GFR (Nov 2015)   Liothyronine     Hair fell out, caused headaches    Nsaids     CKD stage III - avoid nephrotoxic drugs   Nubain [Nalbuphine Hcl]     Burning sensation in back   Penicillins Itching   Prasugrel Itching   Statins Itching    Home  Medications: Prior to Admission medications   Medication Sig Start Date End Date Taking? Authorizing Provider  acetaminophen (TYLENOL) 325 MG tablet Take 1-2 tablets (325-650 mg total) by mouth every 4 (four) hours as needed for mild pain (or temp >/= 101 F). 03/29/22  Yes Angiulli, Lavon Paganini, PA-C  aspirin 81 MG tablet Take 81 mg by mouth daily.   Yes [provider]  clopidogrel (PLAVIX) 75 MG tablet Take 1 tablet (75 mg total) by mouth daily. To prevent strokes Patient taking differently: Take 75 mg by mouth in the morning and at bedtime. To prevent strokes 01/04/22  Yes Crecencio Mc, MD  docusate sodium (COLACE) 100 MG capsule Take 1 capsule (100 mg total) by mouth daily. 03/30/22  Yes Angiulli, Lavon Paganini, PA-C  ezetimibe (ZETIA) 10 MG tablet Take 1 tablet (10 mg total) by mouth daily. 01/11/22  Yes Crecencio Mc, MD  ferrous sulfate 325 (65 FE) MG tablet Take 1 tablet (325 mg total) by mouth daily with breakfast. 03/30/22  Yes Angiulli, Lavon Paganini, PA-C  nortriptyline (PAMELOR) 10 MG capsule Take 1 capsule (10 mg total) by mouth 2 (two) times daily. For chronic headaches 12/28/21  Yes Crecencio Mc, MD  oxyCODONE-acetaminophen (PERCOCET/ROXICET) 5-325 MG tablet Take 1-2 tablets by mouth every 4 (four) hours as needed for moderate pain. 03/29/22  Yes Angiulli, Lavon Paganini, PA-C  ranolazine (RANEXA) 500 MG 12 hr tablet Take 1 tablet (500 mg total) by mouth 2 (two) times daily. 12/28/21  Yes Crecencio Mc, MD  amLODipine (NORVASC) 10 MG tablet Take 1 tablet (10 mg total) by mouth daily. Patient not taking: Reported on 03/29/2022 03/30/22   Angiulli, Lavon Paganini, PA-C  labetalol (NORMODYNE) 300 MG tablet Take 1 tablet (300 mg total) by mouth 2 (two) times daily. For hypertension 03/05/22   Crecencio Mc, MD  spironolactone (ALDACTONE) 25 MG tablet Take 1 tablet (25 mg total) by mouth daily. Patient not taking: Reported on 03/29/2022 03/25/22   Sharen Hones, MD    Critical care time: 50 minutes   Lestine Mount, PA-C [facility] 03/30/22 10:19 AM  Please see [email or web address] for pager details.  From 7A-7P if no response, please call [phone] After hours, please call ELink [phone]

## 2022-03-31 ENCOUNTER — Encounter (HOSPITAL_COMMUNITY): Payer: Self-pay | Admitting: Vascular Surgery

## 2022-03-31 DIAGNOSIS — T8149XA Infection following a procedure, other surgical site, initial encounter: Secondary | ICD-10-CM

## 2022-03-31 LAB — CBC
HCT: 25.4 % — ABNORMAL LOW (ref 36.0–46.0)
HCT: 26.5 % — ABNORMAL LOW (ref 36.0–46.0)
Hemoglobin: 8.4 g/dL — ABNORMAL LOW (ref 12.0–15.0)
Hemoglobin: 8.8 g/dL — ABNORMAL LOW (ref 12.0–15.0)
MCH: 29.1 pg (ref 26.0–34.0)
MCH: 29.3 pg (ref 26.0–34.0)
MCHC: 33.1 g/dL (ref 30.0–36.0)
MCHC: 33.2 g/dL (ref 30.0–36.0)
MCV: 87.9 fL (ref 80.0–100.0)
MCV: 88.3 fL (ref 80.0–100.0)
Platelets: 296 10*3/uL (ref 150–400)
Platelets: 321 10*3/uL (ref 150–400)
RBC: 2.89 MIL/uL — ABNORMAL LOW (ref 3.87–5.11)
RBC: 3 MIL/uL — ABNORMAL LOW (ref 3.87–5.11)
RDW: 14.1 % (ref 11.5–15.5)
RDW: 14.3 % (ref 11.5–15.5)
WBC: 12.4 10*3/uL — ABNORMAL HIGH (ref 4.0–10.5)
WBC: 13.3 10*3/uL — ABNORMAL HIGH (ref 4.0–10.5)
nRBC: 0 % (ref 0.0–0.2)
nRBC: 0 % (ref 0.0–0.2)

## 2022-03-31 LAB — BASIC METABOLIC PANEL
Anion gap: 5 (ref 5–15)
BUN: 19 mg/dL (ref 8–23)
CO2: 21 mmol/L — ABNORMAL LOW (ref 22–32)
Calcium: 8.4 mg/dL — ABNORMAL LOW (ref 8.9–10.3)
Chloride: 107 mmol/L (ref 98–111)
Creatinine, Ser: 1.76 mg/dL — ABNORMAL HIGH (ref 0.44–1.00)
GFR, Estimated: 29 mL/min — ABNORMAL LOW (ref >60)
Glucose, Bld: 111 mg/dL — ABNORMAL HIGH (ref 70–99)
Potassium: 4.4 mmol/L (ref 3.5–5.1)
Sodium: 133 mmol/L — ABNORMAL LOW (ref 135–145)

## 2022-03-31 LAB — PHOSPHORUS: Phosphorus: 3.6 mg/dL (ref 2.5–4.6)

## 2022-03-31 LAB — PROTIME-INR
INR: 1.3 — ABNORMAL HIGH (ref 0.8–1.2)
Prothrombin Time: 15.7 seconds — ABNORMAL HIGH (ref 11.4–15.2)

## 2022-03-31 LAB — FIBRINOGEN: Fibrinogen: 569 mg/dL — ABNORMAL HIGH (ref 210–475)

## 2022-03-31 LAB — MAGNESIUM: Magnesium: 2.5 mg/dL — ABNORMAL HIGH (ref 1.7–2.4)

## 2022-03-31 LAB — APTT: aPTT: 37 seconds — ABNORMAL HIGH (ref 24–36)

## 2022-03-31 NOTE — Progress Notes (Signed)
NAME:  Sabrina [name], MRN:  [id number], DOB:  Nov 16, 1940, LOS: 2 ADMISSION DATE:  03/29/2022 CONSULTATION DATE:  03/30/2022 REFERRING MD:  Pietro Cassis - [facility] CHIEF COMPLAINT:  R groin infection, postoperative   History of Present Illness:  81 year old woman who presented to [facility] from inpatient rehab 6/5 for R groin infection s/p recent bilateral femoral endarterectomy (5/24) with hospitalization at [facility]. Initial postoperative course c/b postprocedural bradycardic cardiac arrest (requiring CPR x 2 mins and Epi) and NSTEMI.  PMHx significant for HTN, HLD, CAD (s/p 3v-CABG 2001), CAS/PVD (s/p L CEA 2004), abdominal aortic ectasia, mild AS, chronic diastolic CHF (Echo 02/3298 with EF > 55%, G1DD), pulmonary HTN, CKD stage III, cervical spondylosis.  Patient reported several days of malaise PTA with increasing leukocytosis (16 from 8). She was seen by [facility] (Dr. Virl Cagey) 6/5 with concern for postoperative R groin infection, due to draining serous fluid from groin incision. Taken to OR 6/6 for irrigation and debridement. Intraoperative course was notable for frank pus surrounding the prior endarterectomy site with concern for patch infection (existing patch removed and replaced with new GSV patch). EBL 360mL. [place] placed postoperatively. Required Neo-Synephrine for BP support throughout the case. Postoperatively, Hgb 6.5 noted and 1U PRBC transfused.   PCCM consulted postoperatively for ICU admission.  Pertinent Medical History:   Past Medical History:  Diagnosis Date   Abdominal aortic ectasia ([place]) 07/13/2017   a.) Surveillance measurements: 2.6 cm ([place] 07/13/2017), 2.9 cm ([place] 09/04/2017), 2.9 cm ([place] 09/14/2018), 2.9 cm ([place] 10/03/2019), 2.6 cm ([place] 04/07/2020)   Amputation of fifth toe, right, traumatic, subsequent encounter ([place]) 06/18/2019   Anemia of chronic kidney failure    Anxiety    Aortic stenosis 03/18/2020   a.) TTE 03/18/2020: EF >55%; mild AS (MPG 8.7 mmHg). b.) TTE 11/16/2021: EF >55%; mild AS  (MPG 9 mmHg)   CAD (coronary artery disease)    a.) s/p 3v CABG 03/29/2000   Carotid artery stenosis    a.) s/p LEFT CEA 09/09/2003. b.) Carotid doppler 24/26/8341: 9-62% LICA, CTO RICA; subclavian stenosis   Cataracts, bilateral    Cervical spondylosis without myelopathy    Chronic diastolic CHF (congestive heart failure), NYHA class 3 (HCC)    a.) TTE 05/27/2016: EF >55%, mild LA enlargement, triv PR, mild MR, mod TR; G3DD. b.) TTE 12/12/2017: EF >55%, mild LVH, BAE, mild MR/PR, mod TR; RVSP 52.8 mmHg. c.) TTE 03/18/2020: EF >55%, BAD, AS (MPG 8.7 mmHg); triv MR, mild TR/PR. d.) TTE 11/16/2021: EF >55%, LVH, G1DD, triv MR, mild PR, mod TR; AS (MPG 9 mmHg); MS (MPG 5 mmHg)   Chronic kidney disease, stage III (moderate) (HCC)    Chronic narcotic use 06/24/2014   Chronic pain syndrome    GERD (gastroesophageal reflux disease)    History of 2019 novel coronavirus disease (COVID-19) 09/22/2021   Hyperlipidemia    Hypertension    Long term current use of antithrombotics/antiplatelets    a.) on daily DAPT therapy (ASA + clopidogrel)   Lumbar stenosis with neurogenic claudication    Mitral stenosis 11/16/2021   a.) TTE 11/16/2021: EF >55%; mod MS (MPG 5 mmHg)   Osteoarthritis of hip    Pulmonary hypertension ([place]) 12/12/2017   a.) TTE 12/12/2017: mild; RVSP 52.8 mmHg   PVD (peripheral vascular disease) (HCC)    Renal artery stenosis (HCC)    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   Significant Hospital Events: Including procedures, antibiotic start and stop dates in addition to other pertinent events   03/17/2022 - Bilateral femoral endarterectomy, c/f periprocedure cardiac arrest/NSTEMI, hospitalized at [facility]. Discharged to inpatient rehab. 6/5 - Malaise x 3 days, seen by VVS  for serous wound drainage with c/f postoperative R groin wound infection. 6/6 OR for I&D of R groin wound. Required Neo throughout case. Time placed. PCCM consulted for ICU admission postoperatively. 6/7 Off pressors, Hgb stable, having pain at the operative site  Interim History / Subjective:   No overnight events BP remains stable off Neo  On zosyn, afebrile 1L UOP yesterday  Objective:  Blood pressure 124/61, pulse 71, temperature 98.8 F (37.1 C), temperature source Oral, resp. rate 18, height 5\' 1"  (1.549 m), weight 73.2 kg, SpO2 98 %.        Intake/Output Summary (Last 24 hours) at 03/31/2022 0734 Last data filed at 03/31/2022 0600 Gross per 24 hour  Intake 2351.41 ml  Output 1550 ml  Net 801.41 ml    Filed Weights   03/30/22 0433 03/30/22 0442  Weight: 73.2 kg 73.2 kg        General:  uncomfortable-appearing elderly F, awake and in no distress HEENT: MM pink/moist, sclera anicteric Neuro: awake, oriented, conversational and moving all extremities to command, decreased sensation in the LE bilaterally, (states been this way since initial surgery), good LE strength  CV: s1s2 rrr, no m/r/g PULM:  clear bilaterally on RA, no accessory muscle use or tachypnea  GI: soft, non-distended and non-tender  Extremities: warm/dry, no edema  Skin: no rashes or lesions, R surgical site with drain in place, tender and mildly edematous without induration or drainage    Labs reviewed: INR 1.3 NA 133 Creatinine 1.76 Hgb 8.4  Resolved Hospital Problem List:  Respiratory Insufficiency  Assessment & Plan:   S/p I&D of R groin endarterectomy incision S/p bilateral femoral endarterectomies 03/17/2022 History of CAS (s/p L CEA 2004) OR 6/6 for I&D. OR findings notable for frank pus surrounding the prior endarterectomy site with concern for patch infection (existing patch removed and replaced with new GSV patch). EBL 365mL. [place] placed. - Was transferred to ICU for hypotension requiring  low dose pressors -now of Neo-synephrine and stable - Goal SBP 120-160 or MAP ~90 - Postoperative wound management/pain control per VVS - WV in place to R groin, 200cc output yesterday - Continue Zosyn for empiric broad-spectrum antibiotic coverage - Follow OR Cx, still pending, narrow abx as able,    Recent cardiac arrest, periprocedural Recent NSTEMI History of CAD s/p 3v-CABG (2001) Hyperlipidemia Patient suffered cardiac arrest 5/24 in the immediate postoperative period at [facility]; per report was sipping water, had a vagal episode with severe bradycardia and subsequent cardiac arrest. Received 2 minutes CPR and Epi x 1, reintubated. - EKG obtained in PACU unremarkable -Lactic cleared - Cardiac monitoring/telemetry - Resume DAPT. statin when appropriate from postsurgical standpoint  Chronic diastolic HF Mild AS Pulmonary HTN Most recent Echo 5/25 with EF 60-65%, no RWMAs, moderate LVH, RVSP unable to be obtained.  - Monitor I&Os - Optimize volume status as able - Cardiac monitoring/tele - Low threshold for repeat Echo if significant clinical changes  History of HTN Home meds: Norvasc - Hold home antihypertensive medications at present - Resume as clinically appropriate, BP still borderline   ABLA, postsurgical -Hgb has been  stable, 8.4 this AM without signs of bleeding - Transfuse for Hgb < 8.0 (in the setting of ACS/cardiac history) - Monitor coags  Respiratory insufficiency, peri-procedural Resolved, now on RA - Pulmonary hygiene - Encourage mobility when appropriate postoperatively  Mild AKI on CKD stage III Baseline Cr 1.4 - 1.6. -improving, making urine - Trend BMP - Replete electrolytes as indicated - Monitor I&Os - Avoid nephrotoxic agents as able - Ensure adequate renal perfusion  Cervical spondylosis Chronic back pain Chronic opioid use Home regimen includes: Percocet 5-325, 1-2 tablers Q4H PRN - Postoperative pain management per VVS - Maximize adjunct  therapies as able  Physical deconditioning Presented from inpatient rehab after prolonged hospitalization for bilateral femoral endarterectomies c/b periprocedural cardiac arrest. - PT/OT as appropriate - Re-engage TOC team    *Now off pressors and oxygen, stable from a critical care standpoint and appropriate for transfer out of ICU.  Best Practice: (right click and "Reselect all SmartList Selections" daily)   Diet/type: clear liquids, ADAT to regular DVT prophylaxis: Per VVS, thin GI prophylaxis: N/A Lines: Arterial Line Foley:  N/A Code Status:  full code Last date of multidisciplinary goals of care discussion [Pending]  Labs:  CBC: Recent Labs  Lab 03/25/22 0524 03/29/22 0616 03/29/22 1210 03/30/22 0343 03/30/22 0950 03/30/22 1005 03/30/22 1527 03/31/22 0013 03/31/22 0630  WBC 8.1 15.6* 20.1* 15.3*  --   --  15.1* 12.4* 13.3*  NEUTROABS 4.7 12.2* 18.4* 12.0*  --   --   --   --   --   HGB 9.0* 9.5* 9.9* 8.6* 6.5* 6.3* 9.0*  9.2* 8.8* 8.4*  HCT 27.7* 28.9* 29.8* 26.2* 19.0* 19.1* 26.1*  25.9* 26.5* 25.4*  MCV 90.2 90.0 90.9 89.4  --   --  86.7 88.3 87.9  PLT 327 400 399 391  --  322 290 296 638    Basic Metabolic Panel: Recent Labs  Lab 03/29/22 0616 03/29/22 1210 03/30/22 0343 03/30/22 0950 03/30/22 1527 03/31/22 0630  NA 135 136 136 136 135 133*  K 4.2 4.4 3.9 4.0 4.0 4.4  CL 106 105 109  --  107 107  CO2 23 20* 22  --  22 21*  GLUCOSE 104* 138* 100*  --  126* 111*  BUN 24* 27* 24*  --  21 19  CREATININE 1.91* 2.14* 1.90*  --  1.61* 1.76*  CALCIUM 9.0 9.1 8.8*  --  8.0* 8.4*  MG  --   --   --   --  1.7 2.5*  PHOS  --   --   --   --   --  3.6    GFR: Estimated Creatinine Clearance: 23.3 mL/min (A) (by C-G formula based on SCr of 1.76 mg/dL (H)). Recent Labs  Lab 03/30/22 0343 03/30/22 1527 03/31/22 0013 03/31/22 0630  WBC 15.3* 15.1* 12.4* 13.3*  LATICACIDVEN  --  1.0  --   --     Liver Function Tests: Recent Labs  Lab 03/25/22 0524  03/29/22 0616 03/29/22 1210  AST 18 16 18   ALT 16 13 15   ALKPHOS 57 72 75  BILITOT 0.7 0.8 0.7  PROT 6.1* 6.3* 7.0  ALBUMIN 2.7* 2.8* 3.0*    No results for input(s): LIPASE, AMYLASE in the last 168 hours. No results for input(s): AMMONIA in the last 168 hours.  ABG:    Component Value Date/Time   PHART 7.457 (H) 03/30/2022 0950   PCO2ART 30.7 (L) 03/30/2022 0950   PO2ART 266 (H) 03/30/2022 7564  HCO3 21.8 03/30/2022 0950   TCO2 23 03/30/2022 0950   ACIDBASEDEF 2.0 03/30/2022 0950   O2SAT 100 03/30/2022 0950     Coagulation Profile: Recent Labs  Lab 03/30/22 1005 03/30/22 1527 03/31/22 0630  INR 1.3* 1.3* 1.3*    Cardiac Enzymes: No results for input(s): CKTOTAL, CKMB, CKMBINDEX, TROPONINI in the last 168 hours.  HbA1C: Hgb A1c MFr Bld  Date/Time Value Ref Range Status  07/19/2016 08:08 AM 5.9 4.6 - 6.5 % Final    Comment:    Glycemic Control Guidelines for People with Diabetes:Non Diabetic:  <6%Goal of Therapy: <7%Additional Action Suggested:  >8%   06/02/2015 10:05 AM 5.8 4.6 - 6.5 % Final    Comment:    Glycemic Control Guidelines for People with Diabetes:Non Diabetic:  <6%Goal of Therapy: <7%Additional Action Suggested:  >8%    CBG: Recent Labs  Lab 03/26/22 0641  GLUCAP 102*    Review of Systems:   Patient unable to answer due to pain, peri-anesthesia.  Past Medical History:  She,  has a past medical history of Abdominal aortic ectasia ([place]) (07/13/2017), Amputation of fifth toe, right, traumatic, subsequent encounter ([place]) (06/18/2019), Anemia of chronic kidney failure, Anxiety, Aortic stenosis (03/18/2020), CAD (coronary artery disease), Carotid artery stenosis, Cataracts, bilateral, Cervical spondylosis without myelopathy, Chronic diastolic CHF (congestive heart failure), NYHA class 3 ([place]), Chronic kidney disease, stage III (moderate) ([place]), Chronic narcotic use (06/24/2014), Chronic pain syndrome, GERD (gastroesophageal reflux disease), History of  2019 novel coronavirus disease (COVID-19) (09/22/2021), Hyperlipidemia, Hypertension, Long term current use of antithrombotics/antiplatelets, Lumbar stenosis with neurogenic claudication, Mitral stenosis (11/16/2021), Osteoarthritis of hip, Pulmonary hypertension ([place]) (12/12/2017), PVD (peripheral vascular disease) ([place]), Renal artery stenosis (HCC), S/P CABG x 3 (03/29/2000), Secondary hyperparathyroidism ([place]), SOB (shortness of breath), and Subclavian arterial stenosis ([place]).   Surgical History:   Past Surgical History:  Procedure Laterality Date   ABDOMINAL HYSTERECTOMY  1976   CAROTID ARTERY ANGIOPLASTY Left    CAROTID ENDARTERECTOMY Left 09/09/2003   Procedure: CAROTID ENDARTERECTOMY; Location: [facility]; Surgeon: Maura Crandall, MD   COLONOSCOPY WITH PROPOFOL N/A 08/16/2017   Procedure: COLONOSCOPY WITH PROPOFOL;  Surgeon: [name], MD;  Location: [facility] ENDOSCOPY;  Service: Endoscopy;  Laterality: N/A;   CORONARY ANGIOPLASTY WITH STENT PLACEMENT  2000   CORONARY ARTERY BYPASS GRAFT N/A 03/29/2000   Procedure: 3v CORONARY ARTERY BYPASS GRAFT; Location: [facility]   CYSTOSCOPY WITH STENT PLACEMENT Bilateral    ENDARTERECTOMY FEMORAL Bilateral 03/17/2022   Procedure: ENDARTERECTOMY FEMORAL ( BILATERAL SFA STENT);  Surgeon: Katha Cabal, MD;  Location: [facility] ORS;  Service: Vascular;  Laterality: Bilateral;   ESOPHAGOGASTRODUODENOSCOPY (EGD) WITH PROPOFOL N/A 08/16/2017   Procedure: ESOPHAGOGASTRODUODENOSCOPY (EGD) WITH PROPOFOL;  Surgeon: [name], MD;  Location: [facility] END[facility]COPY;  Service: Endoscopy;  Laterality: N/A;   ESOPHAGOGASTRODUODENOSCOPY (EGD) WITH PROPOFOL N/A 06/29/2018   Procedure: ESOPHAGOGASTRODUODENOSCOPY (EGD) WITH PROPOFOL;  Surgeon: Virgel Manifold, MD;  Location: [facility] ENDOSCOPY;  Service: Endoscopy;  Laterality: N/A;   INSERTION OF ILIAC STENT Left 03/17/2022   Procedure: INSERTION OF ILIAC STENT;  Surgeon: Katha Cabal, MD;  Location: [facility] ORS;  Service:  Vascular;  Laterality: Left;   LAPAROSCOPIC CHOLECYSTECTOMY Left 10/26/1999   Procedure: LAPAROSCOPIC CHOLECYSTECTOMY; Location: [facility]; Surgeon: Rochel Brome, MD   LEFT HEART CATH AND CORONARY ANGIOGRAPHY N/A 11/18/2021   Procedure: LEFT HEART CATH AND CORONARY ANGIOGRAPHY;  Surgeon: Yolonda Kida, MD;  Location: [place] [facility];  Service: Cardiovascular;  Laterality: N/A;   LEFT HEART CATH AND CORS/GRAFTS ANGIOGRAPHY Left  11/19/2002   Procedure: LEFT HEART CATH AND CORS/GRAFTS ANGIOGRAPHY; Location: [place]; Surgeon: Katrine Coho, MD   LEFT HEART CATH AND CORS/GRAFTS ANGIOGRAPHY Left 09/17/2003   Procedure: LEFT HEART CATH AND CORS/GRAFTS ANGIOGRAPHY; Location: [place]; Surgeon: Katrine Coho, MD   LOWER EXTREMITY ANGIOGRAPHY Right 01/06/2022   Procedure: Lower Extremity Angiography;  Surgeon: Katha Cabal, MD;  Location: [place] [facility];  Service: Cardiovascular;  Laterality: Right;   RENAL ARTERY ANGIOPLASTY Bilateral 12/2013   TOE AMPUTATION Right    small toe   TONSILLECTOMY AND ADENOIDECTOMY     TOTAL HIP ARTHROPLASTY Left 2005   TOTAL HIP ARTHROPLASTY Right 2015   UPPER EXTREMITY ANGIOGRAPHY Left 11/23/2021   Procedure: UPPER EXTREMITY ANGIOGRAPHY;  Surgeon: Algernon Huxley, MD;  Location: [place] [facility];  Service: Cardiovascular;  Laterality: Left;   Social History:   reports that she quit smoking about 23 years ago. Her smoking use included cigarettes. She has never used smokeless tobacco. She reports that she does not drink alcohol and does not use drugs.   Family History:  Her family history includes Breast cancer (age of onset: 51) in her paternal aunt; Cerebral aneurysm in her son and son; Diabetes in her mother; Heart disease in her sister; Hypertension in her father and mother; Multiple sclerosis in her daughter and son; Seizures in her son; Stroke in her mother.   Allergies: Allergies  Allergen Reactions   Citalopram Anaphylaxis    Throat closing     Dilaudid [Hydromorphone Hcl] Nausea And Vomiting   Hydrochlorothiazide Other (See Comments)    Decreased GFR (Nov 2015)   Liothyronine     Hair fell out, caused headaches    Nsaids     CKD stage III - avoid nephrotoxic drugs   Nubain [Nalbuphine Hcl]     Burning sensation in back   Penicillins Itching   Prasugrel Itching   Statins Itching    Home Medications: Prior to Admission medications   Medication Sig Start Date End Date Taking? Authorizing Provider  acetaminophen (TYLENOL) 325 MG tablet Take 1-2 tablets (325-650 mg total) by mouth every 4 (four) hours as needed for mild pain (or temp >/= 101 F). 03/29/22  Yes Angiulli, Lavon Paganini, PA-C  aspirin 81 MG tablet Take 81 mg by mouth daily.   Yes [provider]  clopidogrel (PLAVIX) 75 MG tablet Take 1 tablet (75 mg total) by mouth daily. To prevent strokes Patient taking differently: Take 75 mg by mouth in the morning and at bedtime. To prevent strokes 01/04/22  Yes Crecencio Mc, MD  docusate sodium (COLACE) 100 MG capsule Take 1 capsule (100 mg total) by mouth daily. 03/30/22  Yes Angiulli, Lavon Paganini, PA-C  ezetimibe (ZETIA) 10 MG tablet Take 1 tablet (10 mg total) by mouth daily. 01/11/22  Yes Crecencio Mc, MD  ferrous sulfate 325 (65 FE) MG tablet Take 1 tablet (325 mg total) by mouth daily with breakfast. 03/30/22  Yes Angiulli, Lavon Paganini, PA-C  nortriptyline (PAMELOR) 10 MG capsule Take 1 capsule (10 mg total) by mouth 2 (two) times daily. For chronic headaches 12/28/21  Yes Crecencio Mc, MD  oxyCODONE-acetaminophen (PERCOCET/ROXICET) 5-325 MG tablet Take 1-2 tablets by mouth every 4 (four) hours as needed for moderate pain. 03/29/22  Yes Angiulli, Lavon Paganini, PA-C  ranolazine (RANEXA) 500 MG 12 hr tablet Take 1 tablet (500 mg total) by mouth 2 (two) times daily. 12/28/21  Yes Crecencio Mc, MD  amLODipine (NORVASC) 10 MG tablet  Take 1 tablet (10 mg total) by mouth daily. Patient not taking: Reported on 03/29/2022 03/30/22   Angiulli,  Lavon Paganini, PA-C  labetalol (NORMODYNE) 300 MG tablet Take 1 tablet (300 mg total) by mouth 2 (two) times daily. For hypertension 03/05/22   Crecencio Mc, MD  spironolactone (ALDACTONE) 25 MG tablet Take 1 tablet (25 mg total) by mouth daily. Patient not taking: Reported on 03/29/2022 03/25/22   Sharen Hones, MD    Critical care time:    Otilio Carpen Harkirat Orozco, PA-C [facility] 03/31/22 7:34 AM  Please see [email or web address] for pager details.  From 7A-7P if no response, please call [phone] After hours, please call ELink [phone]

## 2022-03-31 NOTE — Progress Notes (Signed)
  Daily Progress Note  S/p:1 Day Post-Op right femoral exposure, washout, debridement re-do patch plasty with ipsilateral GSV  Subjective: Pain in the right groin. No pain in the foot  Objective: Vitals:   03/31/22 1200 03/31/22 1300  BP:  102/76  Pulse: 70 71  Resp: (!) 22 (!) 24  Temp: 98.7 F (37.1 C)   SpO2: 99% 99%    Physical Examination Signals in the DP and PT Groin soft, VAC to suction  VAC output thin  ASSESSMENT/PLAN:   Continue broad spectrum abx, narrow per primary team, recommend 6wks OOB, PT, OT VAC will be in place for 7 days.  Will continue to follow     [name] MD MS [facility] [phone] 03/31/2022  2:29 PM

## 2022-03-31 NOTE — PMR Pre-admission (Signed)
PMR Admission Coordinator Pre-Admission Assessment  Patient: Sabrina [name] is an 81 y.o., female MRN: [id number] DOB: [birth year] Height: _0  (154.9 cm) Weight: 74.1 kg  Insurance Information HMO:     PPO:      PCP:      IPA:      80/20:      OTHER:  PRIMARY: Medicare a and b      Policy#: [id number]      Subscriber: pt Benefits:  Phone #: passport one source online     Name: 6/7 Eff. Date: 01/24/2007     Deduct: $1600      Out of Pocket Max: none      Life Max: none CIR: 100%       SNF: 20 full days Outpatient: 80%     Co-Pay: 20% Home Health: 100%      Co-Pay: none DME: 80%     Co-Pay: 20% Providers: pt choice  SECONDARY: BCBS supplement      Policy#: [id number]  Financial Counselor:       Phone#:   The "Data Collection Information Summary" for patients in Inpatient Rehabilitation Facilities with attached "Privacy Act [place] Records" was provided and verbally reviewed with: N/A  Emergency Contact Information Contact Information     Name Relation Home Work Mobile   [place] Daughter   [phone]   Dorita Fray [phone]  [phone]   Tessa, [place] [phone]        Current Medical History  Patient Admitting Diagnosis: Debility  History of Present Illness:  81 year old right-handed female with history of obesity with BMI 30.53, chronic hip pain, chronic anemia, CAD/CABG x3 with LIMA-LAD, SVG-distal RCA and SVG-ramus intermedius 03/2000 with recent heart cath 11/18/2021 showing extensive native CAD and 100% occlusion SVG-RCA and 90%, distal SVG-OM with significant stenosis of her left subclavian status post left subclavian stenting 11/23/2021 maintained on low-dose aspirin, diastolic congestive heart failure, carotid artery angioplasty 2004, CKD stage III, hypertension, hyperlipidemia, peripheral vascular disease with multiple interventions in the past.   Presented to [facility]17[facility] for elective bilateral superior femoral artery  and left iliac stenting with vascular surgery due to peripheral vascular disease and debilitating claudication with some mild rest pain.  Underwent left common femoral, profunda femoris, and superficial femoral artery endarterectomies as well as right common femoral profunda femoris and superficial femoral artery enterectomy's stent placement to left external iliac artery with 8 mm diameter by 4 cm length of life star stent 03/17/2022 per Dr. Leotis Pain.    Postoperative acute respiratory failure cardiac arrest requiring CPR x2 minutes.  To have markedly elevated troponin of 15,000.  She did require intubation for airway protection and extubated same day.  Echocardiogram with ejection fraction of 60 to 65% no wall motion abnormalities.  She is currently maintained on aspirin 81 mg daily and Plavix 75 mg daily for at least 12 months and plan to discuss outpatient cardiac catheterization.  Ranexa ongoing at 500 mg twice daily per cardiology services Dr. Lujean Amel.  Ongoing wound care bilateral groin dressings with wound VACS removed 03/22/2022.   Acute on chronic anemia 8.3 and monitored she was transfused 1 unit packed red blood cells 5/27.  Admitted to CIR on 03/24/22.  On Cir she continued to have some increased drainage form the groin site. Vascular surgery at [facility] ws consulted and recommended to readmit her to acute hospital for further medical workup. Noted Leukocytosis. Dr Virl Cagey, completed I and D of the right groin site on 6/6  with reexposure orf the common femoral artery, and completed common femoral patch plasty using ipsilateral saphenous vein with Myriad Morcel placement and Wound Vac placed. Postoperatively placed in ICU. Placed on Zosyn and await wound cultures. Plan Vac for 7 days and recommend 6 weeks of antibiotics.   Patient's medical record from [facility] has been reviewed by the rehabilitation admission coordinator and physician.  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 b.) TTE 11/16/2021: EF >55%; mild AS (MPG 9 mmHg)   CAD (coronary artery disease)    a.) s/p 3v CABG 03/29/2000   Carotid artery stenosis    a.) s/p LEFT CEA 09/09/2003. b.) Carotid doppler 40/98/1191: 4-78% LICA, CTO RICA; subclavian stenosis   Cataracts, bilateral    Cervical spondylosis without myelopathy    Chronic diastolic CHF (congestive heart failure), NYHA class 3 (HCC)    a.) TTE 05/27/2016: EF >55%, mild LA enlargement, triv PR, mild MR, mod TR; G3DD. b.) TTE 12/12/2017: EF >55%, mild LVH, BAE, mild MR/PR, mod TR; RVSP 52.8 mmHg. c.) TTE 03/18/2020: EF >55%, BAD, AS (MPG 8.7 mmHg); triv MR, mild TR/PR. d.) TTE 11/16/2021: EF >55%, LVH, G1DD, triv MR, mild PR, mod TR; AS (MPG 9 mmHg); MS (MPG 5 mmHg)   Chronic kidney disease, stage III (moderate) (HCC)    Chronic narcotic use 06/24/2014   Chronic pain syndrome    GERD (gastroesophageal reflux disease)    History of 2019 novel coronavirus disease (COVID-19) 09/22/2021   Hyperlipidemia    Hypertension    Long term current use of antithrombotics/antiplatelets    a.) on daily DAPT therapy (ASA + clopidogrel)   Lumbar stenosis with neurogenic claudication    Mitral stenosis 11/16/2021   a.) TTE 11/16/2021: EF >55%; mod MS (MPG 5 mmHg)   Osteoarthritis of hip    Pulmonary hypertension ([place]) 12/12/2017   a.) TTE 12/12/2017: mild; RVSP 52.8 mmHg   PVD (peripheral vascular disease) (HCC)    Renal artery stenosis (HCC)    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   Has the patient had major surgery during 100 days prior to admission? Yes  Family History   family history includes Breast cancer (age of onset: 49) in her paternal aunt; Cerebral aneurysm in her son and son; Diabetes in her mother; Heart disease in her sister; Hypertension in her father and mother; Multiple sclerosis in her daughter and son; Seizures in her son; Stroke in her mother.  Current Medications  Current Facility-Administered Medications:    0.9 %  sodium chloride infusion, , Intravenous, PRN, Broadus John, MD, Last Rate: 10 mL/hr at 04/04/22 0800, Infusion Verify at 04/04/22 0800   acetaminophen (TYLENOL) tablet 325-650 mg, 325-650 mg, Oral, Q4H PRN, 650 mg at 03/31/22 1217 **OR** acetaminophen (TYLENOL) suppository 325-650 mg, 325-650 mg, Rectal,  Q4H PRN, Laurence Slate M, PA-C   alum & mag hydroxide-simeth (MAALOX/MYLANTA) 200-200-20 MG/5ML suspension 15-30 mL, 15-30 mL, Oral, Q2H PRN, Theda Sers, Emma M, PA-C   amLODipine (NORVASC) tablet 10 mg, 10 mg, Oral, Daily, Mariel Aloe, MD   aspirin chewable tablet 81 mg, 81 mg, Oral, Daily, Laurence Slate M, PA-C, 81 mg at 04/05/22 7544   Chlorhexidine Gluconate Cloth 2 % PADS 6 each, 6 each, Topical, Daily, Agarwala, Einar Grad, MD, 6 each at 04/04/22 2100   clopidogrel (PLAVIX) tablet 75 mg, 75 mg, Oral, Daily, Laurence Slate M, PA-C, 75 mg at 04/05/22 0854   docusate sodium (COLACE) capsule 100 mg, 100 mg, Oral, BID PRN, [place] Crane M, PA-C, 100 mg at 04/01/22 2039   ezetimibe (ZETIA) tablet 10 mg, 10 mg, Oral, Daily, Collins, Emma M, PA-C, 10 mg at 04/05/22 0854   ferrous sulfate tablet 325 mg, 325 mg, Oral, Q breakfast, Laurence Slate M, PA-C, 325 mg at 04/05/22 9201   hydrALAZINE (APRESOLINE) injection 10 mg, 10 mg, Intravenous, Q6H PRN, Mariel Aloe,  MD   labetalol (NORMODYNE) tablet 300 mg, 300 mg, Oral, BID, Mariel Aloe, MD, 300 mg at 04/05/22 0854   nortriptyline (PAMELOR) capsule 10 mg, 10 mg, Oral, BID, Laurence Slate M, PA-C, 10 mg at 04/05/22 0071   oxyCODONE-acetaminophen (PERCOCET/ROXICET) 5-325 MG per tablet 1-2 tablet, 1-2 tablet, Oral, Q4H PRN, Ulyses Amor, PA-C, 2 tablet at 04/04/22 1825   piperacillin-tazobactam (ZOSYN) IVPB 3.375 g, 3.375 g, Intravenous, Q8H, Mariel Aloe, MD, Last Rate: 12.5 mL/hr at 04/05/22 0512, 3.375 g at 04/05/22 0512   polyethylene glycol (MIRALAX / GLYCOLAX) packet 17 g, 17 g, Oral, Daily PRN, [place] Crane M, PA-C   ranolazine (RANEXA) 12 hr tablet 500 mg, 500 mg, Oral, BID, Laurence Slate M, PA-C, 500 mg at 04/05/22 2197  Patients Current Diet:  Diet Order             Diet Heart Room service appropriate? Yes; Fluid consistency: Thin  Diet effective now                  Precautions / Restrictions Precautions Precautions: Fall, Other (comment) Precaution Comments: R groin wound vac; urinary urgency/incontinence Restrictions Weight Bearing Restrictions: No   Has the patient had 2 or more falls or a fall with injury in the past year? No  Prior Activity Level Community (5-7x/wk): Mod I and driving  Prior Functional Level Self Care: Did the patient need help bathing, dressing, using the toilet or eating? Independent  Indoor Mobility: Did the patient need assistance with walking from room to room (with or without device)? Independent  Stairs: Did the patient need assistance with internal or external stairs (with or without device)? Independent  Functional Cognition: Did the patient need help planning regular tasks such as shopping or remembering to take medications? Independent  Patient Information Are you of Hispanic, Latino/a,or Spanish origin?: A. No, not of Hispanic, Latino/a, or Spanish origin What is your race?: B. Black or African American Do you need or want an  interpreter to communicate with a doctor or health care staff?: 0. No  Patient's Response To:  Health Literacy and Transportation Is the patient able to respond to health literacy and transportation needs?: Yes Health Literacy - How often do you need to have someone help you when you read instructions, pamphlets, or other written material from your doctor or pharmacy?: Never In the past 12 months, has lack of transportation kept you from  medical appointments or from getting medications?: No In the past 12 months, has lack of transportation kept you from meetings, work, or from getting things needed for daily living?: No  Development worker, international aid / [place] Devices/Equipment: Bedside commode/3-in-1 Home Equipment: Conservation officer, nature (2 wheels), [employer] - single point  Prior Device Use: Indicate devices/aids used by the patient prior to current illness, exacerbation or injury? None of the above  Current Functional Level Cognition  Arousal/Alertness: Awake/alert Overall Cognitive Status: Within Functional Limits for tasks assessed Orientation Level: Oriented X4 General Comments: safety cues as needed    Extremity Assessment (includes Sensation/Coordination)  Upper Extremity Assessment: Generalized weakness  Lower Extremity Assessment: Generalized weakness RLE Deficits / Details: hip strength limited supine <3/5 secondary to pain    ADLs  Grooming: Wash/dry hands, Wash/dry face, Set up, Sitting Upper Body Bathing: Sitting, Minimal assistance Lower Body Bathing: Moderate assistance, Sit to/from stand Upper Body Dressing : Sitting, Minimal assistance Upper Body Dressing Details (indicate cue type and reason): IV lines and wrap to R forearm Lower Body Dressing: Moderate assistance, Sit to/from stand Toilet Transfer: Minimal assistance, Stand-pivot, BSC/3in1    Mobility  Overal bed mobility: Needs Assistance Bed Mobility: Supine to Sit Supine to sit: Min assist General bed  mobility comments: pt able to assist LLE to EOB with use of gait belt for AAROM, significant increased time and effort, still reliant on use of bed rail and modA to scoot L hip to EOB    Transfers  Overall transfer level: Needs assistance Equipment used: Rolling walker (2 wheels) Transfers: Sit to/from Stand Sit to Stand: Min assist Bed to/from chair/wheelchair/BSC transfer type:: Step pivot Step pivot transfers: Min assist General transfer comment: increased time and effort, multiple sit<>stands from EOB and recliner to RW, repeated cues for hand placement, minA for trunk elevation and stability; external assist to stabilize RW    Ambulation / Gait / Stairs / Wheelchair Mobility  Ambulation/Gait Ambulation/Gait assistance: Herbalist (Feet): 40 Feet Assistive device: Rolling walker (2 wheels) Gait Pattern/deviations: Step-to pattern, Decreased weight shift to left, Trunk flexed, Antalgic General Gait Details: slow, antalgic, guarded gait with RW and minA for stability and RW management; frequent cues to maintain closer proximity to RW and upright posture as pt frequently leaning R forearm against RW; pt requires encouragement to progress gait distance; intermittent standing rest breaks before seated rest secondary to pain and fatigue Gait velocity: decreased Gait velocity interpretation: <1.31 ft/sec, indicative of household ambulator    Posture / Balance Balance Overall balance assessment: Needs assistance Sitting-balance support: Feet supported, No upper extremity supported Sitting balance-Leahy Scale: Poor Postural control: Right lateral lean, Posterior lean Standing balance support: Reliant on assistive device for balance Standing balance-Leahy Scale: Fair Standing balance comment: pt can static stand with close min guard to perform pericare    Special needs/care consideration Surgical wound with Wound Vac Twice per week Likely need 6 weeks of antibiotics    Previous Home Environment  Living Arrangements: Alone  Lives With: Alone Available Help at Discharge: Family, Available 24 hours/day (daughter, [name], from [place] here  to [employer] as needed) Type of Home: House Home Layout: One level Home Access: Level entry Bathroom Shower/Tub: Tub/shower unit, Multimedia programmer: Standard Bathroom Accessibility: Yes How Accessible: Accessible via walker [place]: No Additional Comments: Patient stating Daughter and SIL will be staying with her for a few days after she returns from [place] for Discharge Living Setting:  Patient's home, Alone Type of Home at Discharge: House Discharge Home Layout: One level Discharge Home Access: Level entry Discharge Bathroom Shower/Tub: Tub/shower unit, Walk-in shower Discharge Bathroom Toilet: Standard Discharge Bathroom Accessibility: Yes How Accessible: Accessible via walker Does the patient have any problems obtaining your medications?: No  Social/Family/Support Systems Patient Roles: Parent Contact Information: daughter, [name] Anticipated Caregiver: daughter [name] and SIL here from [place] and will asisst as needed Anticipated Caregiver's Contact Information: see contacts Ability/Limitations of Caregiver: Initally 24/7 and then prn when [name] returns to [place] Caregiver Availability: 24/7 Discharge Plan Discussed with Primary Caregiver: Yes Is Caregiver In Agreement with Plan?: Yes Does Caregiver/Family have Issues with Lodging/Transportation while Pt is in Rehab?: No  Goals Patient/Family Goal for Rehab: Mod I to intermittent supervision with PT and OT Expected length of stay: ELOS 7 to 10 days Pt/Family Agrees to Admission and willing to participate: Yes Program Orientation Provided & Reviewed with Pt/Caregiver Including Roles  & Responsibilities: Yes  Decrease burden of Care through IP rehab admission: n/a  Possible need for SNF placement upon  discharge: not anticipated  Patient Condition: I have reviewed medical records from [facility], spoken with  patient, daughter, and family member. I met with patient at the bedside for inpatient rehabilitation assessment.  Patient will benefit from ongoing PT and OT, can actively participate in 3 hours of therapy a day 5 days of the week, and can make measurable gains during the admission.  Patient will also benefit from the coordinated team approach during an Inpatient Acute Rehabilitation admission.  The patient will receive intensive therapy as well as Rehabilitation physician, nursing, social worker, and care management interventions.  Due to bladder management, bowel management, safety, skin/wound care, disease management, medication administration, pain management, and patient education the patient requires 24 hour a day rehabilitation nursing.  The patient is currently min to mod assist overall with mobility and basic ADLs.  Discharge setting and therapy post discharge at home with home health is anticipated.  Patient has agreed to participate in the Acute Inpatient Rehabilitation Program and will admit today.  Preadmission Screen Completed By: Cleatrice Burke, RN MSN 04/05/2022 10:14 AM ______________________________________________________________________   Discussed status with Dr. Letta Pate on 04/05/2022 at 1014 and received approval for admission today.  Admission Coordinator:  Cleatrice Burke, RN MSN time  1014 Date 04/05/2022   Assessment/Plan: Diagnosis:Debility related to PAD Does the need for close, 24 hr/day Medical supervision in concert with the patient's rehab needs make it unreasonable for this patient to be served in a less intensive setting? Yes Co-Morbidities requiring supervision/potential complications: Multiple LE revascularization procedures, CAD, groin wound infection, post operative hemorrhage, uncontrolled pain and HTN Due to bladder management,  bowel management, safety, skin/wound care, disease management, medication administration, pain management, and patient education, does the patient require 24 hr/day rehab nursing? Yes Does the patient require coordinated care of a physician, rehab nurse, PT, OT, and SLP to address physical and functional deficits in the context of the above medical diagnosis(es)? Yes Addressing deficits in the following areas: balance, endurance, locomotion, strength, transferring, bowel/bladder control, bathing, dressing, feeding, grooming, toileting, and psychosocial support Can the patient actively participate in an intensive therapy program of at least 3 hrs of therapy 5 days a week? Yes The potential for patient to make measurable gains while on inpatient rehab is good Anticipated functional outcomes upon discharge from inpatient rehab: modified independent and supervision PT, modified independent and supervision OT, n/a SLP Estimated rehab length of  stay to reach the above functional goals is: 7-10d Anticipated discharge destination: Home 10. Overall Rehab/Functional Prognosis: excellent   MD Signature: Charlett Blake M.D. [place] [facility] Fellow Am Acad of Phys Med and Rehab Diplomate Am Board of Electrodiagnostic Med Fellow Am Board of Interventional Pain

## 2022-03-31 NOTE — Evaluation (Signed)
Occupational Therapy Evaluation Patient Details Name: Sabrina [name] MRN: [id number] DOB: 10-31-1940 Today's Date: 03/31/2022   History of Present Illness Pt is an 81 y.o. female admitted from [place] on 03/30/22 with postoperative R groin infection; s/p R groin wound I&D, common femoral repair with wound vac placement 6/6. Of note, pt with recent admission at [facility] 03/17/22-03/24/22 for elective bilateral femoral endarterectomy with post-op cardiac arrest; d/c to CIR for rehab. 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   Clinical Impression   Patient returned to acute care from [facility] for the diagnosis and procedure above.  PTA she was at [facility] completing rehab to maximize her functional status for a return home.  Al home she is very independent, not requiring assist for any aspect of her life.  Pain s/p surgery is the primary deficit limiting her independence.  OT will continue efforts in the acute setting, with a return to CIR recommended prior to returning home.       Recommendations for follow up therapy are one component of a multi-disciplinary discharge planning process, led by the attending physician.  Recommendations may be updated based on patient status, additional functional criteria and insurance authorization.   Follow Up Recommendations  Acute inpatient rehab (3hours/day)    Assistance Recommended at Discharge Frequent or constant Supervision/Assistance  Patient can return home with the following Assistance with cooking/housework;A lot of help with bathing/dressing/bathroom;A little help with walking and/or transfers;Assist for transportation    Functional Status Assessment  Patient has had a recent decline in their functional status and demonstrates the ability to make significant improvements in function in a reasonable and predictable amount of time.  Equipment Recommendations  BSC/3in1     Recommendations for Other Services Rehab consult     Precautions / Restrictions Precautions Precautions: Fall Precaution Comments: R groin wound vac Restrictions Weight Bearing Restrictions: No      Mobility Bed Mobility               General bed mobility comments: up in the recliner    Transfers Overall transfer level: Needs assistance Equipment used: Rolling walker (2 wheels) Transfers: Sit to/from Stand Sit to Stand: Min assist                  Balance Overall balance assessment: Needs assistance Sitting-balance support: Feet supported, No upper extremity supported Sitting balance-Leahy Scale: Fair     Standing balance support: Reliant on assistive device for balance Standing balance-Leahy Scale: Poor                             ADL either performed or assessed with clinical judgement   ADL       Grooming: Wash/dry hands;Wash/dry face;Set up;Sitting   Upper Body Bathing: Set up;Sitting   Lower Body Bathing: Moderate assistance;Sit to/from stand   Upper Body Dressing : Set up;Sitting   Lower Body Dressing: Moderate assistance;Sit to/from stand   Toilet Transfer: Minimal assistance;Stand-pivot;BSC/3in1                   Vision Patient Visual Report: No change from baseline       Perception Perception Perception: Within Functional Limits   Praxis Praxis Praxis: Intact    Pertinent Vitals/Pain Pain Assessment Pain Assessment: Faces Faces Pain Scale: Hurts whole lot Pain Location: R groin wound, R-side buttocks Pain Descriptors /  Indicators: Guarding, Grimacing Pain Intervention(s): Monitored during session     Hand Dominance Right   Extremity/Trunk Assessment Upper Extremity Assessment Upper Extremity Assessment: Overall WFL for tasks assessed   Lower Extremity Assessment Lower Extremity Assessment: Defer to PT evaluation RLE Deficits / Details: hip strength limited supine <3/5 secondary to pain    Cervical / Trunk Assessment Cervical / Trunk Assessment: Normal   Communication Communication Communication: No difficulties   Cognition Arousal/Alertness: Awake/alert Behavior During Therapy: WFL for tasks assessed/performed, Flat affect Overall Cognitive Status: Within Functional Limits for tasks assessed                                       General Comments  VSS on RA    Exercises     Shoulder Instructions      Home Living Family/patient expects to be discharged to:: Inpatient rehab Living Arrangements: Alone Available Help at Discharge: Family;Available PRN/intermittently Type of Home: House Home Access: Level entry     Home Layout: One level     Bathroom Shower/Tub: Tub/shower unit;Walk-in shower   Bathroom Toilet: Standard Bathroom Accessibility: Yes How Accessible: Accessible via walker Home Equipment: [place] (2 wheels);Cane - single point   Additional Comments: Patient stating Daughter and SIL will be staying with her for a few days after she returns from [place] With: Alone    Prior Functioning/Environment Prior Level of Function : Independent/Modified Independent;Driving             Mobility Comments: Typically independent without DME. ADLs Comments: Prior to initial admission, indep with cooking, cleaning, bathing, dressing.  Continues to drive.  No assist with bill payment or medication management.        OT Problem List: Decreased range of motion;Decreased strength;Decreased activity tolerance;Impaired balance (sitting and/or standing);Decreased safety awareness;Decreased knowledge of use of DME or AE;Pain      OT Treatment/Interventions: Self-care/ADL training;Therapeutic exercise;Energy conservation;DME and/or AE instruction;Therapeutic activities;Patient/family education;Balance training    OT Goals(Current goals can be found in the care plan section) Acute Rehab OT Goals Patient Stated Goal: Continue rehab so  she can return home OT Goal Formulation: With patient Time For Goal Achievement: 04/14/22 Potential to Achieve Goals: Good ADL Goals Pt Will Perform Grooming: standing;with set-up Pt Will Perform Lower Body Dressing: with min assist;sit to/from stand;with adaptive equipment Pt Will Transfer to Toilet: with supervision;ambulating;regular height toilet  OT Frequency: Min 3X/week    Co-evaluation              AM-PAC OT "6 Clicks" Daily Activity     Outcome Measure Help from another person eating meals?: None Help from another person taking care of personal grooming?: None Help from another person toileting, which includes using toliet, bedpan, or urinal?: A Little Help from another person bathing (including washing, rinsing, drying)?: A Lot Help from another person to put on and taking off regular upper body clothing?: A Little Help from another person to put on and taking off regular lower body clothing?: A Lot 6 Click Score: 18   End of Session Equipment Utilized During Treatment: Rolling walker (2 wheels) Nurse Communication: Mobility status  Activity Tolerance: Patient limited by pain Patient left: in chair;with call bell/phone within reach;with family/visitor present  OT Visit Diagnosis: Unsteadiness on feet (R26.81);Pain Pain - Right/Left: Right Pain - part of body: Leg  Time: 3794-3276 OT Time Calculation (min): 15 min Charges:  OT General Charges $OT Visit: 1 Visit OT Evaluation $OT Eval Moderate Complexity: 1 Mod  03/31/2022  RP, OTR/L  Acute Rehabilitation Services  Office:  [phone]   Metta Clines 03/31/2022, 10:26 AM

## 2022-03-31 NOTE — Progress Notes (Signed)
Inpatient Rehabilitation Admissions Coordinator   I met at bedside with patient , daughter and son in law. Patient previously on CIR  from [facility] 5/31 until 6/5. Readmitted to acute. They would like to readmit to CIR when medically ready before discharge home. I feel she would benefit for completion of her rehab stay. I await medical clearance of when to pursue readmit.  Danne Baxter, RN, MSN Rehab Admissions Coordinator [phone] 03/31/2022 2:27 PM

## 2022-03-31 NOTE — Progress Notes (Addendum)
Patient transfer bed assigned. This RN attempted to call report [phone]. No answer at this time.   Report given to Murray Hodgkins, Therapist, sports. Daughter made aware of patient transfer from ICU.

## 2022-03-31 NOTE — Evaluation (Signed)
Physical Therapy Evaluation Patient Details Name: Sabrina [name] MRN: [id number] DOB: [birth year] Today's Date: 03/31/2022  History of Present Illness  Pt is an 81 y.o. female admitted from [place] on 03/30/22 with postoperative R groin infection; s/p R groin wound I&D, common femoral repair with wound vac placement 6/6. Of note, pt with recent admission at [facility] 03/17/22-03/24/22 for elective bilateral femoral endarterectomy with post-op cardiac arrest; d/c to CIR for rehab. 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   Clinical Impression  Pt presents with an overall decrease in functional mobility secondary to above. PTA, pt independent without DME, lives alone, drives; pt with recent admission 03/17/22 and d/c to CIR for post-acute rehab. Today, pt requiring modA for mobility, unable to reach BLEs for ADL tasks; pt limited by generalized weakness, significant pain, decreased activity tolerance and impaired balance strategies. Pt remains an excellent candidate for continued intensive AIR-level therapies before return home. Will follow acutely to address established goals.    Recommendations for follow up therapy are one component of a multi-disciplinary discharge planning process, led by the attending physician.  Recommendations may be updated based on patient status, additional functional criteria and insurance authorization.  Follow Up Recommendations Acute inpatient rehab (3hours/day)    Assistance Recommended at Discharge Frequent or constant Supervision/Assistance  Patient can return home with the following  A little help with walking and/or transfers;Assistance with cooking/housework;Assist for transportation;Help with stairs or ramp for entrance;A lot of help with bathing/dressing/bathroom    Equipment Recommendations  (TBD)  Recommendations for Other Services   Rehab consult   Functional  Status Assessment Patient has had a recent decline in their functional status and demonstrates the ability to make significant improvements in function in a reasonable and predictable amount of time.     Precautions / Restrictions Precautions Precautions: Fall;Other (comment) Precaution Comments: R groin wound vac      Mobility  Bed Mobility Overal bed mobility: Needs Assistance Bed Mobility: Supine to Sit     Supine to sit: Mod assist     General bed mobility comments: ModA for RLE management and scooting hips to EOB, increased time and effort secondary to c/o significant R groin/hip pain    Transfers Overall transfer level: Needs assistance Equipment used: Rolling walker (2 wheels) Transfers: Sit to/from Stand Sit to Stand: Min assist   Step pivot transfers: Min assist       General transfer comment: increased time and effort, minA for trunk elevation and stability with pivotal steps from bed to recliner with RW; further distance limited by c/o significant pain    Ambulation/Gait                  Stairs            Wheelchair Mobility    Modified Rankin (Stroke Patients Only)       Balance Overall balance assessment: Needs assistance Sitting-balance support: Feet supported, No upper extremity supported Sitting balance-Leahy Scale: Fair     Standing balance support: Bilateral upper extremity supported, During functional activity Standing balance-Leahy Scale: Poor Standing balance comment: reliant on UE support                             Pertinent Vitals/Pain Pain Assessment Pain Assessment: Faces Faces Pain Scale: Hurts whole lot Pain Location: R groin wound, R-side buttocks Pain Descriptors /  Indicators: Discomfort, Grimacing, Moaning Pain Intervention(s): Limited activity within patient's tolerance, Monitored during session, Premedicated before session, Repositioned    Home Living Family/patient expects to be discharged to::  Inpatient rehab Living Arrangements: Alone Available Help at Discharge: Family;Available PRN/intermittently Type of Home: House Home Access: Level entry       Home Layout: One level Home Equipment: Conservation officer, nature (2 wheels);Cane - single point Additional Comments: Daughter Investment banker, corporate) and son-in-law visiting from [place] for a short while, otherwise local family works during the day    Prior Function Prior Level of Function : Independent/Modified Independent;Driving             Mobility Comments: Typically independent without DME, denies falls prior to initial admission, drives. Since d/c to CIR, working on ambulation with RW, limited by BLE pain ADLs Comments: Prior to initial admission, indep with cooking, cleaning, bathing, dressing     Hand Dominance        Extremity/Trunk Assessment   Upper Extremity Assessment Upper Extremity Assessment: Generalized weakness    Lower Extremity Assessment Lower Extremity Assessment: RLE deficits/detail;Generalized weakness RLE Deficits / Details: hip strength limited supine <3/5 secondary to pain       Communication   Communication: No difficulties  Cognition Arousal/Alertness: Awake/alert Behavior During Therapy: WFL for tasks assessed/performed, Flat affect Overall Cognitive Status: Within Functional Limits for tasks assessed                                          General Comments General comments (skin integrity, edema, etc.): VSS on RA; pt hopeful for return to CIR at home, does not feel she would be able to manage at home    Exercises     Assessment/Plan    PT Assessment Patient needs continued PT services  PT Problem List Decreased strength;Decreased range of motion;Decreased activity tolerance;Decreased balance;Decreased mobility;Decreased knowledge of use of DME;Decreased knowledge of precautions;Pain       PT Treatment Interventions DME instruction;Therapeutic exercise;Gait training;Balance  training;Functional mobility training;Therapeutic activities;Patient/family education;Stair training    PT Goals (Current goals can be found in the Care Plan section)  Acute Rehab PT Goals Patient Stated Goal: hopeful for continued rehab at [facility] PT Goal Formulation: With patient Time For Goal Achievement: 04/14/22 Potential to Achieve Goals: Good    Frequency Min 3X/week     Co-evaluation               AM-PAC PT "6 Clicks" Mobility  Outcome Measure Help needed turning from your back to your side while in a flat bed without using bedrails?: A Lot Help needed moving from lying on your back to sitting on the side of a flat bed without using bedrails?: A Lot Help needed moving to and from a bed to a chair (including a wheelchair)?: A Little Help needed standing up from a chair using your arms (e.g., wheelchair or bedside chair)?: A Little Help needed to walk in hospital room?: Total Help needed climbing 3-5 steps with a railing? : Total 6 Click Score: 12    End of Session Equipment Utilized During Treatment: Gait belt Activity Tolerance: Patient tolerated treatment well Patient left: in chair;with call bell/phone within reach Nurse Communication: Mobility status PT Visit Diagnosis: Other abnormalities of gait and mobility (R26.89);Muscle weakness (generalized) (M62.81);Pain    Time: 2956-2130 PT Time Calculation (min) (ACUTE ONLY): 28 min   Charges:   PT Evaluation $  PT Eval Moderate Complexity: [place], [place], DPT Acute Rehabilitation Services  Pager [phone] Office [place] 03/31/2022, 10:03 AM

## 2022-04-01 LAB — BASIC METABOLIC PANEL
Anion gap: 7 (ref 5–15)
BUN: 24 mg/dL — ABNORMAL HIGH (ref 8–23)
CO2: 21 mmol/L — ABNORMAL LOW (ref 22–32)
Calcium: 8.1 mg/dL — ABNORMAL LOW (ref 8.9–10.3)
Chloride: 105 mmol/L (ref 98–111)
Creatinine, Ser: 2.11 mg/dL — ABNORMAL HIGH (ref 0.44–1.00)
GFR, Estimated: 23 mL/min — ABNORMAL LOW (ref >60)
Glucose, Bld: 110 mg/dL — ABNORMAL HIGH (ref 70–99)
Potassium: 4.2 mmol/L (ref 3.5–5.1)
Sodium: 133 mmol/L — ABNORMAL LOW (ref 135–145)

## 2022-04-01 LAB — CBC
HCT: 23.6 % — ABNORMAL LOW (ref 36.0–46.0)
Hemoglobin: 8 g/dL — ABNORMAL LOW (ref 12.0–15.0)
MCH: 30.2 pg (ref 26.0–34.0)
MCHC: 33.9 g/dL (ref 30.0–36.0)
MCV: 89.1 fL (ref 80.0–100.0)
Platelets: 273 10*3/uL (ref 150–400)
RBC: 2.65 MIL/uL — ABNORMAL LOW (ref 3.87–5.11)
RDW: 14.1 % (ref 11.5–15.5)
WBC: 14.3 10*3/uL — ABNORMAL HIGH (ref 4.0–10.5)
nRBC: 0 % (ref 0.0–0.2)

## 2022-04-01 MED ORDER — SODIUM CHLORIDE 0.9 % IV SOLN: INTRAVENOUS | Status: DC | PRN

## 2022-04-01 MED ORDER — SODIUM CHLORIDE 0.9 % IV SOLN
INTRAVENOUS | Status: DC
Start: 2022-04-01 — End: 2022-04-04

## 2022-04-01 NOTE — Progress Notes (Signed)
Inpatient Rehab Admissions Coordinator:   I'm following for my colleague, Danne Baxter, today.  We continue to follow for potential re-admit pending bed availability.  Shann Medal, PT, DPT Admissions Coordinator [phone] 04/01/22  12:09 PM

## 2022-04-01 NOTE — Progress Notes (Signed)
Pharmacy Antibiotic Note  Sabrina [name] is a 81 y.o. female admitted on 03/29/2022 with  R groin infection s/p I/D .  Pharmacy has been consulted for zosyn dosing.  Allergy to PCN-itching, tolerated zosyn intra-op  5/30 wound culture + kleb pneumo, proteus mirabilis (sensitive to all, but ampicillin) 6/6 Rt groin tissue/fluid - pseudomonas  Patient currently afebrile, wbc stable at 14, scr is trending up to 2.11 today. Patient is borderline for dose adjustment.   Plan: Continue Extended interveral zosyn infusion, if scr worsens in am will need to change dose Vascular is recommending 6 weeks of antibiotics Will monitor for acute changes in renal function and adjust as needed F/u cultures results and de-escalate as appropriate   Height: 5\' 1"  (154.9 cm) Weight: 73.2 kg (161 lb 6 oz) IBW/kg (Calculated) : 47.8  Temp (24hrs), Avg:98.3 F (36.8 C), Min:98.1 F (36.7 C), Max:98.7 F (37.1 C)  Recent Labs  Lab 03/29/22 1210 03/30/22 0343 03/30/22 1527 03/31/22 0013 03/31/22 0630 04/01/22 0017  WBC 20.1* 15.3* 15.1* 12.4* 13.3* 14.3*  CREATININE 2.14* 1.90* 1.61*  --  1.76* 2.11*  LATICACIDVEN  --   --  1.0  --   --   --      Estimated Creatinine Clearance: 19.5 mL/min (A) (by C-G formula based on SCr of 2.11 mg/dL (H)).    Allergies  Allergen Reactions   Citalopram Anaphylaxis    Throat closing    Dilaudid [Hydromorphone Hcl] Nausea And Vomiting   Hydrochlorothiazide Other (See Comments)    Decreased GFR (Nov 2015)   Liothyronine     Hair fell out, caused headaches    Nsaids     CKD stage III - avoid nephrotoxic drugs   Nubain [Nalbuphine Hcl]     Burning sensation in back   Penicillins Itching   Prasugrel Itching   Statins Itching    Thank you for allowing pharmacy to be a part of this patient's care.  Erin Hearing PharmD., BCPS Clinical Pharmacist 04/01/2022 12:39 PM

## 2022-04-01 NOTE — Progress Notes (Signed)
Mobility Specialist: Progress Note   04/01/22 1724  Mobility  Activity Ambulated with assistance in hallway  Level of Assistance Minimal assist, patient does 75% or more  Assistive Device Front wheel walker  Distance Ambulated (ft) 150 ft  Activity Response Tolerated well  $Mobility charge 1 Mobility   Pre-Mobility: 65 HR, 100% SpO2 During Mobility: 86 HR Post-Mobility: 75 HR, 100% SpO2  Pt received in the bed and agreeable to ambulation. Anxious throughout session d/t pain. No c/o dizziness or SOB. Pt back to bed after session with call bell and phone in reach. Bed alarm is on.   [facility] Daphnee Preiss Mobility Specialist Mobility Specialist [address]: [phone]

## 2022-04-01 NOTE — Progress Notes (Signed)
PROGRESS NOTE  Sabrina [name]  DOB: 1941/02/13  PCP: Crecencio Mc, MD GQB:[id number]  DOA: 03/29/2022  LOS: 3 days  Hospital Day: 4  Brief narrative: Sabrina [name] is a 81 y.o. female with PMH significant for HTN, HLD, CAD (s/p 3v-CABG 2001), CAS/PVD (s/p L CEA 2004), abdominal aortic ectasia, mild AS, chronic diastolic CHF (Echo 05/2799 with EF > 55%, G1DD), pulmonary HTN, CKD stage III, PAD status post multiple revascularization procedures, cervical spondylosis. 5/24, patient underwent bilateral superior femoral artery and left iliac stenting for debilitating claudication at [facility].  Postoperatively, patient had bradycardic cardiac arrest requiring CPR for about 2 minutes and epi.  Her troponin was elevated to over 15,000, but echo did not show any wall motion abnormality or drop in EF.  She was seen by cardiology, placed on aspirin, Plavix, Ranexa. 5/30, patient was discharged to inpatient rehab at [facility].  It appears that the wound culture obtained on 5/30 on the day of discharge grew out moderate Proteus mirabilis and rare Klebsiella pneumoniae but patient was not started on antibiotic for days.   At [facility], patient started having nausea, vomiting, diarrhea as well as drainage from the right inguinal surgical wound.  Cultures were obtained, patient was started on IV antibiotics.  Labs showed worsening leukocytosis and creatinine.  Vascular surgery consultation was obtained and per recommendation, patient was admitted to [facility] on 6/5. 6/5, patient underwent IND of groin wound, she had frank pus surrounding the prior endarterectomy site with concern for arterial patch infection.  It was removed and replaced with new venous patch and wound VAC was placed.  Patient required Neo-Synephrine for BP support throughout the case.  Postoperatively, hemoglobin is noted to be low at 6.5 and 1 unit of PRBC was transfused Patient was monitored in ICU for next 48 hours. 6/8, transferred out to [facility]. See below  for more detail  Subjective: Patient was seen and examined this morning.  Pleasant elderly African-American female.  Lying on bed.  Not in distress. Daughter and son-in-law at bedside. Last 24 hours, patient no fever, hemodynamically stable, blood pressure low normal range, breathing on room air Labs this morning with WBC count up 14.3, hemoglobin down 8, creatinine at 2.1   Principal Problem:   Postoperative wound infection Active Problems:   Acute kidney injury superimposed on chronic kidney disease (HCC)   HLD (hyperlipidemia)   Peripheral vascular disease (HCC)   Generalized anxiety disorder   Acute blood loss anemia   Myocardial infarction due to demand ischemia [facility])    Assessment and plan: S/p bilateral femoral endarterectomies 03/17/2022 Postoperative wound infection S/p I&D of R groin endarterectomy incision - 6/6 -Vascular surgery following. -Currently on IV Zosyn -Pending wound culture sent from the OR  Septic shock due to wound infection History of hypertension -Patient was hypotensive throughout the surgery and in ICU requiring Neo-Synephrine -Hemodynamically stable now.  Resume normal saline at 75 mill per hour.   -PTA, patient was on labetalol, amlodipine, Aldactone. -Currently on amlodipine and labetalol.  Blood pressure in low normal range.  I would keep amlodipine on hold as well.  Continue labetalol.  Continue to monitor blood pressures Recent Labs  Lab 03/30/22 0343 03/30/22 1527 03/31/22 0013 03/31/22 0630 04/01/22 0017  WBC 15.3* 15.1* 12.4* 13.3* 14.3*  LATICACIDVEN  --  1.0  --   --   --    AKI on CKD 3B -Baseline creatinine less than 1.5  -Creatinine trended up because of sepsis.   -Worsening last  24 hours.   -Continue IV fluid.   -Nephrotoxic meds on hold.  Continue monitor Recent Labs    03/23/22 0602 03/24/22 0503 03/25/22 0524 03/26/22 0540 03/27/22 7867 03/29/22 0616 03/29/22 1210 03/30/22 0343 03/30/22 1527 03/31/22 0630  04/01/22 0017  BUN 23  --  25* 24* 26* 24* 27* 24* 21 19 24*  CREATININE 1.43* 1.59* 1.89* 1.77* 1.89* 1.91* 2.14* 1.90* 1.61* 1.76* 2.11*     Peripheral artery disease  S/p bilateral lower extremity endarterectomy, L CEA for CAS in 2004 Recent postop ardiac arrest secondary to NSTEMI Hyperlipidemia -Continue aspirin, Plavix, Ranexa.  Not sure why patient is not on statin.   Acute blood loss anemia Anemia chronic disease -Baseline hemoglobin less than 11.  Hemoglobin was at the lowest of 6.3 on 6/6.  2 units PRBCs were transfused.  Noted a gradual drop in last 72 hours. Continue to monitor.   -Continue iron supplement Recent Labs    03/08/22 1406 03/17/22 1406 03/30/22 1005 03/30/22 1527 03/31/22 0013 03/31/22 0630 04/01/22 0017  HGB 10.7*   < > 6.3* 9.0*  9.2* 8.8* 8.4* 8.0*  MCV 88.8   < >  --  86.7 88.3 87.9 89.1  FERRITIN 89  --   --   --   --   --   --   TIBC 293  --   --   --   --   --   --   IRON 81  --   --   --   --   --   --    < > = values in this interval not displayed.   Anxiety and depression -Continue nortriptyline   Goals of care   Code Status: Full Code    Mobility: PT  Skin assessment:     Nutritional status:  Body mass index is 30.49 kg/m.          Diet:  Diet Order             Diet Heart Room service appropriate? Yes; Fluid consistency: Thin  Diet effective now                   DVT prophylaxis:  SCDs Start: 03/30/22 1201 Place TED hose Start: 03/29/22 1540   Antimicrobials: IV Zosyn Fluid: Restart NS at 75 mill per hour. Consultants: Vascular surgery Family Communication: Daughter at bedside  Status is: Inpatient  Continue in-hospital care because: Needs IV antibiotics, vascular surgery follow-up, labs monitoring Level of care: Telemetry Medical   Dispo: The patient is from: Inpatient rehab              Anticipated d/c is to: Pending clinical course              Patient currently is not medically stable to d/c.    Difficult to place patient No     Infusions:   sodium chloride 10 mL/hr at 04/01/22 0515   sodium chloride     piperacillin-tazobactam (ZOSYN)  IV 3.375 g (04/01/22 0516)    Scheduled Meds:  sodium chloride   Intravenous Once   sodium chloride   Intravenous Once   aspirin  81 mg Oral Daily   Chlorhexidine Gluconate Cloth  6 each Topical Daily   clopidogrel  75 mg Oral Daily   ezetimibe  10 mg Oral Daily   ferrous sulfate  325 mg Oral Q breakfast   labetalol  300 mg Oral BID   mupirocin ointment  1 application. Nasal BID  nortriptyline  10 mg Oral BID   ranolazine  500 mg Oral BID    PRN meds: sodium chloride, acetaminophen **OR** acetaminophen, alum & mag hydroxide-simeth, docusate sodium, labetalol, oxyCODONE-acetaminophen, polyethylene glycol   Antimicrobials: Anti-infectives (From admission, onward)    Start     Dose/Rate Route Frequency Ordered Stop   03/30/22 1445  piperacillin-tazobactam (ZOSYN) IVPB 3.375 g        3.375 g 12.5 mL/hr over 240 Minutes Intravenous Every 8 hours 03/30/22 1351     03/30/22 0958  ceFAZolin 1 g / gentamicin 80 mg in NS 500 mL surgical irrigation  Status:  Discontinued          As needed 03/30/22 0959 03/30/22 1108   03/30/22 0815  piperacillin-tazobactam (ZOSYN) IVPB 3.375 g        3.375 g 100 mL/hr over 30 Minutes Intravenous  Once 03/30/22 0813 03/30/22 0852   03/29/22 1800  cefTRIAXone (ROCEPHIN) 2 g in sodium chloride 0.9 % 100 mL IVPB  Status:  Discontinued        2 g 200 mL/hr over 30 Minutes Intravenous Every 24 hours 03/29/22 1550 03/30/22 1351   03/29/22 1800  metroNIDAZOLE (FLAGYL) IVPB 500 mg  Status:  Discontinued        500 mg 100 mL/hr over 60 Minutes Intravenous Every 12 hours 03/29/22 1550 03/30/22 1351       Objective: Vitals:   04/01/22 0821 04/01/22 1309  BP: (!) 116/47   Pulse: 74   Resp: 18 18  Temp: 98.2 F (36.8 C) 98 F (36.7 C)  SpO2: 100%     Intake/Output Summary (Last 24 hours) at 04/01/2022  1314 Last data filed at 04/01/2022 1309 Gross per 24 hour  Intake 750 ml  Output 700 ml  Net 50 ml   Filed Weights   03/30/22 0433 03/30/22 0442  Weight: 73.2 kg 73.2 kg   Weight change:  Body mass index is 30.49 kg/m.   Physical Exam: General exam: Pleasant, elderly African-American female.  Propped up on bed.  Taking her breakfast Skin: No rashes, lesions or ulcers. HEENT: Atraumatic, normocephalic, no obvious bleeding Lungs: Clear to auscultation bilaterally CVS: Regular rate and rhythm, no murmur GI/Abd soft, nontender, nondistended, bowel sound present CNS: Alert, awake, oriented x3 Psychiatry: Mood appropriate Extremities: No pedal edema, no evidence  Data Review: I have personally reviewed the laboratory data and studies available.  F/u labs ordered Unresulted Labs (From admission, onward)    None       Signed, Terrilee Croak, MD [facility] 04/01/2022

## 2022-04-01 NOTE — Care Management Important Message (Signed)
Important Message  Patient Details  Name: Sabrina [name] MRN: [id number] Date of Birth: 27-Jan-1941   Medicare Important Message Given:  Yes     Lakisha Peyser 04/01/2022, 4:30 PM

## 2022-04-01 NOTE — Progress Notes (Addendum)
  Progress Note    04/01/2022 8:50 AM 2 Days Post-Op  Subjective:  R groin sore but she says otherwise she is doing okay   Vitals:   04/01/22 0340 04/01/22 0821  BP: (!) 99/43 (!) 116/47  Pulse: 68 74  Resp: 17 18  Temp: 98.2 F (36.8 C) 98.2 F (36.8 C)  SpO2: 100% 100%   Physical Exam: Cardiac:  regular Lungs:  non labored Incisions:  right groin with wound VAC, good suction and seal. Serosanguinous output. Left groin saphenectomy site, clean and dry dressings Extremities:  well perfused and warm with Doppler PT/DP Abdomen:  soft, non distended Neurologic: alert and oriented  CBC    Component Value Date/Time   WBC 14.3 (H) 04/01/2022 0017   RBC 2.65 (L) 04/01/2022 0017   HGB 8.0 (L) 04/01/2022 0017   HGB 9.2 (L) 04/13/2014 0952   HCT 23.6 (L) 04/01/2022 0017   HCT 24.7 (L) 04/12/2014 1143   PLT 273 04/01/2022 0017   PLT 182 04/11/2014 0558   MCV 89.1 04/01/2022 0017   MCV 85 03/27/2014 0944   MCH 30.2 04/01/2022 0017   MCHC 33.9 04/01/2022 0017   RDW 14.1 04/01/2022 0017   RDW 13.7 03/27/2014 0944   LYMPHSABS 1.7 03/30/2022 0343   MONOABS 0.9 03/30/2022 0343   EOSABS 0.5 03/30/2022 0343   BASOSABS 0.0 03/30/2022 0343    BMET    Component Value Date/Time   NA 133 (L) 04/01/2022 0017   NA 139 09/13/2014 0000   NA 134 (L) 04/12/2014 0558   K 4.2 04/01/2022 0017   K 4.4 04/12/2014 0558   CL 105 04/01/2022 0017   CL 101 04/12/2014 0558   CO2 21 (L) 04/01/2022 0017   CO2 27 04/12/2014 0558   GLUCOSE 110 (H) 04/01/2022 0017   GLUCOSE 90 04/12/2014 0558   BUN 24 (H) 04/01/2022 0017   BUN 18 09/13/2014 0000   BUN 16 04/12/2014 0558   CREATININE 2.11 (H) 04/01/2022 0017   CREATININE 1.12 04/12/2014 0558   CALCIUM 8.1 (L) 04/01/2022 0017   CALCIUM 8.5 04/12/2014 0558   GFRNONAA 23 (L) 04/01/2022 0017   GFRNONAA 49 (L) 04/12/2014 0558   GFRAA 50 (L) 03/10/2020 1311   GFRAA 57 (L) 04/12/2014 0558    INR    Component Value Date/Time   INR 1.3 (H)  03/31/2022 0630   INR 1.0 03/27/2014 0944     Intake/Output Summary (Last 24 hours) at 04/01/2022 0850 Last data filed at 04/01/2022 0515 Gross per 24 hour  Intake 510 ml  Output 300 ml  Net 210 ml     Assessment/Plan:  81 y.o. female is s/p Incision and debridement of the right groin wound Reexposure of the common femoral artery Common femoral artery patch excision Common femoral patch plasty using ipsilateral greater saphenous vein Myriad Morcel placement VAC placement   2 Days Post-Op   Overall doing well. Extremities well perfused and warm with brisk doppler signals Abx x 6 weeks Right Groin VAC x 7 days (04/06/22) Mobilize as tolerated Will continue to follow   Karoline Caldwell, PA-C [facility] [phone] 04/01/2022 8:50 AM VASCULAR STAFF ADDENDUM: I have independently interviewed and examined the patient. I agree with the above.  Less pain today. Signals in the foot, no significant VAC output. OOB as tolerated.  Cultures with few pseudomonas - final pending   [name], MD [facility] Phone Number: [phone] 04/01/2022 11:35 AM

## 2022-04-01 NOTE — Progress Notes (Signed)
Physical Therapy Treatment Patient Details Name: Sabrina [name] MRN: [id number] DOB: [birth year] Today's Date: 04/01/2022   History of Present Illness Pt is an 81 y.o. female admitted from [place] on 03/30/22 with postoperative R groin infection; s/p R groin wound I&D, common femoral repair with wound vac placement 6/6. Of note, pt with recent admission at [facility] 03/17/22-03/24/22 for elective bilateral femoral endarterectomy with post-op cardiac arrest; d/c to [facility] for rehab. 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   PT Comments    Pt progressing with mobility. Today's session focused on bed mobility, transfer and gait training with RW; pt requiring up to [facility] for mobility, increased assist for LE ADL tasks. Pt remains limited by generalized weakness, decreased activity tolerance, significant R groin incision pain and impaired balance strategies/postural reactions. Pt's daughter present and supportive. Continue to recommend intensive AIR-level therapies to maximize functional mobility and independence prior to return home.    Recommendations for follow up therapy are one component of a multi-disciplinary discharge planning process, led by the attending physician.  Recommendations may be updated based on patient status, additional functional criteria and insurance authorization.  Follow Up Recommendations  Acute inpatient rehab (3hours/day)     Assistance Recommended at Discharge Frequent or constant Supervision/Assistance  Patient can return home with the following A little help with walking and/or transfers;Assistance with cooking/housework;Assist for transportation;Help with stairs or ramp for entrance;A lot of help with bathing/dressing/bathroom   Equipment Recommendations   (TBD - RW, BSC)    Recommendations for Other Services       Precautions / Restrictions Precautions Precautions:  Fall;Other (comment) Precaution Comments: R groin wound vac; urinary urgency/incontinence; watch BP (soft BP during session 6/8)     Mobility  Bed Mobility Overal bed mobility: Needs Assistance Bed Mobility: Supine to Sit     Supine to sit: Mod assist, HOB elevated     General bed mobility comments: pt able to assist LLE to EOB with use of gait belt for AAROM, significant increased time and effort, still reliant on use of bed rail and modA to scoot L hip to EOB    Transfers Overall transfer level: Needs assistance Equipment used: Rolling walker (2 wheels) Transfers: Sit to/from Stand Sit to Stand: Min assist           General transfer comment: increased time and effort, multiple sit<>stands from EOB and recliner to RW, repeated cues for hand placement, minA for trunk elevation and stability; external assist to stabilize RW    Ambulation/Gait Ambulation/Gait assistance: Min assist Gait Distance (Feet): 40 Feet Assistive device: Rolling walker (2 wheels) Gait Pattern/deviations: Step-to pattern, Decreased weight shift to left, Trunk flexed, Antalgic Gait velocity: decreased Gait velocity interpretation: <1.31 ft/sec, indicative of household ambulator   General Gait Details: slow, antalgic, guarded gait with RW and minA for stability and RW management; frequent cues to maintain closer proximity to RW and upright posture as pt frequently leaning R forearm against RW; pt requires encouragement to progress gait distance; intermittent standing rest breaks before seated rest secondary to pain and fatigue   Stairs             Wheelchair Mobility    Modified Rankin (Stroke Patients Only)       Balance Overall balance assessment: Needs assistance Sitting-balance support: Feet supported, No upper extremity supported Sitting balance-Leahy Scale: Fair     Standing balance support: Bilateral upper  extremity supported, No upper extremity supported Standing balance-Leahy  Scale: Fair Standing balance comment: pt can static stand with close min guard to perform pericare                            Cognition Arousal/Alertness: Awake/alert Behavior During Therapy: WFL for tasks assessed/performed, Flat affect Overall Cognitive Status: Within Functional Limits for tasks assessed                                 General Comments: WFL for simple tasks, though distracted by pain and requiring encouragement to mobilize further        Exercises      General Comments General comments (skin integrity, edema, etc.): pt with urine incontinence upon sitting in recliner, requiring assist for pericare and washup, increased time to recover after ambulation before being able to move onto these ADL tasks. pt also with c/o nausea and "feeling hot", post-ambulation BP 94/48, post-additional standing BP 53/45 (unsure reliable), sitting BP 117/48. pt's daughter present and supportive      Pertinent Vitals/Pain Pain Assessment Pain Assessment: Faces Faces Pain Scale: Hurts whole lot Pain Location: R groin wound Pain Descriptors / Indicators: Guarding, Grimacing Pain Intervention(s): Monitored during session, Other (comment) (pt reports declining AM pain meds, encouraged pt to consider to allow for mobility progression)    Home Living                          Prior Function            PT Goals (current goals can now be found in the care plan section) Progress towards PT goals: Progressing toward goals    Frequency    Min 3X/week      PT Plan Current plan remains appropriate    Co-evaluation              AM-PAC PT "6 Clicks" Mobility   Outcome Measure  Help needed turning from your back to your side while in a flat bed without using bedrails?: A Lot Help needed moving from lying on your back to sitting on the side of a flat bed without using bedrails?: A Lot Help needed moving to and from a bed to a chair (including  a wheelchair)?: A Little Help needed standing up from a chair using your arms (e.g., wheelchair or bedside chair)?: A Little Help needed to walk in hospital room?: A Lot Help needed climbing 3-5 steps with a railing? : Total 6 Click Score: 13    End of Session Equipment Utilized During Treatment: Gait belt Activity Tolerance: Patient tolerated treatment well;Patient limited by pain Patient left: in chair;with call bell/phone within reach;with family/visitor present Nurse Communication: Mobility status PT Visit Diagnosis: Other abnormalities of gait and mobility (R26.89);Muscle weakness (generalized) (M62.81);Pain     Time: 0102-7253 PT Time Calculation (min) (ACUTE ONLY): 32 min  Charges:  $Gait Training: 8-22 mins $Therapeutic Activity: 8-22 mins                     Mabeline Caras, PT, DPT Acute Rehabilitation Services  Pager [phone] Office [place] 04/01/2022, 12:33 PM

## 2022-04-01 NOTE — H&P (Incomplete)
Physical Medicine and Rehabilitation Admission H&P     HPI: Sabrina [name] is an 81 year old right-handed female with history of obesity BMI 30.53, chronic hip pain, chronic anemia, CAD/CABG x3 with LIMA-LAD,SVG-ramus intermedius 03/2000 with recent heart catheterization 11/18/2021 showing extensive native CAD and 100% occlusion SVG-RCA and 90%, distal SVG-OM with significant stenosis of her left subclavian status post left subclavian stenting 11/23/2021 maintained on low-dose aspirin, diastolic congestive heart failure, carotid artery angioplasty 2004, CKD stage III, hypertension, hyperlipidemia, peripheral vascular disease with multiple interventions in the past.  Per chart review lives alone.  1 level home.  Family in area with good support.  Presented to [facility] 03/17/2022 for elective bilateral superior femoral artery and left iliac stenting with vascular surgery due to peripheral vascular disease and debilitating claudication with some mild rest pain.  Underwent left common femoral, profunda femoris and superficial femoral artery endarterectomies as well as right common femoral profunda femoris and superficial femoral artery enterectomy's stent placement to left external iliac artery with 8 mm diameter by 4 cm length of life star stent 03/17/2022 per Dr. Leotis Pain.  Postoperative acute respiratory failure cardiac arrest requiring CPR x2 minutes.  Found to have markedly elevated troponin of 15,000.  She did require intubation for airway protection and extubated same day.  Echocardiogram with ejection fraction of 60 to 65% no wall motion abnormality.  Maintained on aspirin and Plavix for least 12 months and then plan to discuss outpatient cardiac catheterization.  Ranexa ongoing at 500 mg twice daily per cardiology service Dr. Lujean Amel.  Ongoing wound care bilateral groin dressings with noted drainage wound VAC of been removed 03/22/2022 wound cultures pending.  Tolerating a regular diet.  Acute on  chronic anemia 8.3 monitor transfused 5/27 1 unit packed red blood cells.  Therapy evaluations completed patient was admitted for a comprehensive rehab program 03/24/2022.  Patient was slow but progressive gains with functional mobility.  Patient with persistent drainage from right groin wound she was covered with Keflex for recent wound cultures.  Follow-up chemistries noted leukocytosis 15,600 with hemoglobin 9.5, chemistries unremarkable except BUN 24 creatinine 1.91, blood cultures no growth to date, surgical PCR screening positive.  Vascular surgery consulted 03/29/2022 and felt debridement was needed.  Patient was discharged to acute care services 03/29/2022 and underwent incision and debridement of right groin wound reexposure of the common femoral artery common femoral artery patch excision common femoral patch plasty using ipsilateral greater saphenous vein and wound VAC placement 03/30/2022 per Dr. Orlie Pollen through 04/06/2022.  Patient's aspirin and Plavix ongoing as prior to surgical intervention.  Currently maintained on Zosyn for wound coverage and awaiting plan of duration.  Therapy evaluations resumed and patient was admitted back to inpatient rehab services to continue comprehensive intensive rehab therapies.  Review of Systems  Constitutional:  Negative for fever.  HENT:  Negative for hearing loss.   Eyes:  Negative for blurred vision and double vision.  Respiratory:  Negative for cough and shortness of breath.   Cardiovascular:  Positive for leg swelling. Negative for chest pain and palpitations.  Gastrointestinal:  Positive for constipation and nausea. Negative for heartburn and vomiting.       GERD  Genitourinary:  Negative for dysuria, flank pain and hematuria.  Musculoskeletal:  Positive for joint pain and myalgias.  Skin:  Negative for rash.  Psychiatric/Behavioral:         Anxiety  All other systems reviewed and are negative.  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 b.) TTE 11/16/2021: EF >55%; mild AS (MPG 9 mmHg)   CAD (coronary artery disease)    a.) s/p 3v CABG 03/29/2000   Carotid artery stenosis    a.) s/p LEFT CEA 09/09/2003. b.) Carotid doppler 47/82/9562: 1-30% LICA, CTO RICA; subclavian stenosis   Cataracts, bilateral    Cervical spondylosis without myelopathy    Chronic diastolic CHF (congestive heart failure), NYHA class 3 (HCC)    a.) TTE 05/27/2016: EF >55%, mild LA enlargement, triv PR, mild MR, mod TR; G3DD. b.) TTE 12/12/2017: EF >55%, mild LVH, BAE, mild MR/PR, mod TR; RVSP 52.8 mmHg. c.) TTE 03/18/2020: EF >55%, BAD, AS (MPG 8.7 mmHg); triv MR, mild TR/PR. d.) TTE 11/16/2021: EF >55%, LVH, G1DD, triv MR, mild PR, mod TR; AS (MPG 9 mmHg); MS (MPG 5 mmHg)   Chronic kidney disease, stage III (moderate) (HCC)    Chronic narcotic use 06/24/2014   Chronic pain syndrome    GERD (gastroesophageal reflux disease)    History of 2019 novel coronavirus disease (COVID-19) 09/22/2021   Hyperlipidemia    Hypertension    Long term current use of antithrombotics/antiplatelets    a.) on daily DAPT therapy (ASA + clopidogrel)   Lumbar stenosis with neurogenic claudication    Mitral stenosis 11/16/2021   a.) TTE 11/16/2021: EF >55%; mod MS (MPG 5 mmHg)   Osteoarthritis of hip    Pulmonary hypertension ([place]) 12/12/2017   a.) TTE 12/12/2017: mild; RVSP 52.8 mmHg   PVD (peripheral vascular disease) (HCC)    Renal artery stenosis (HCC)    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   Past Surgical History:  Procedure Laterality Date   ABDOMINAL HYSTERECTOMY  1976   CAROTID ARTERY ANGIOPLASTY Left    CAROTID ENDARTERECTOMY Left 09/09/2003   Procedure: CAROTID ENDARTERECTOMY; Location: [facility]; Surgeon: Maura Crandall, MD   COLONOSCOPY WITH PROPOFOL N/A 08/16/2017   Procedure: COLONOSCOPY WITH PROPOFOL;  Surgeon: [name], MD;  Location: [facility];  Service: Endoscopy;  Laterality: N/A;   CORONARY ANGIOPLASTY WITH STENT PLACEMENT  2000   CORONARY ARTERY BYPASS GRAFT N/A 03/29/2000   Procedure: 3v CORONARY ARTERY BYPASS GRAFT; Location: [facility]   CYSTOSCOPY WITH STENT PLACEMENT Bilateral    ENDARTERECTOMY FEMORAL Bilateral 03/17/2022   Procedure: ENDARTERECTOMY FEMORAL ( BILATERAL SFA STENT);  Surgeon: Katha Cabal, MD;  Location: [facility];  Service: Vascular;  Laterality: Bilateral;   ENDARTERECTOMY FEMORAL Right 03/30/2022   Procedure: RE-EXPOSURE OF RIGHT COMMON FEMORAL ARTERY, RE-DO RIGHT FEMORAL ENDARTERECTOMY WITH VEIN PATCH;  Surgeon: Broadus John, MD;  [facility];  Service: Vascular;  Laterality: Right;  WOUND VAC   ESOPHAGOGASTRODUODENOSCOPY (EGD) WITH PROPOFOL N/A 08/16/2017   Procedure: ESOPHAGOGASTRODUODENOSCOPY (EGD) WITH PROPOFOL;  Surgeon: [name], MD;  Location: [facility];  Service: Endoscopy;  Laterality: N/A;   ESOPHAGOGASTRODUODENOSCOPY (EGD) WITH PROPOFOL  N/A 06/29/2018   Procedure: ESOPHAGOGASTRODUODENOSCOPY (EGD) WITH PROPOFOL;  Surgeon: Virgel Manifold, MD;  Location: [facility];  Service: Endoscopy;  Laterality: N/A;   GROIN DEBRIDEMENT Right 03/30/2022   Procedure: [place];  Surgeon: Broadus John, MD;  Location: [facility];  Service: Vascular;  Laterality: Right;   INSERTION OF ILIAC STENT Left 03/17/2022   Procedure: INSERTION OF ILIAC STENT;   Surgeon: Katha Cabal, MD;  Location: [facility];  Service: Vascular;  Laterality: Left;   LAPAROSCOPIC CHOLECYSTECTOMY Left 10/26/1999   Procedure: LAPAROSCOPIC CHOLECYSTECTOMY; Location: [facility]; Surgeon: Rochel Brome, MD   LEFT HEART CATH AND CORONARY ANGIOGRAPHY N/A 11/18/2021   Procedure: LEFT HEART CATH AND CORONARY ANGIOGRAPHY;  Surgeon: Yolonda Kida, MD;  Location: [facility];  Service: Cardiovascular;  Laterality: N/A;   LEFT HEART CATH AND CORS/GRAFTS ANGIOGRAPHY Left 11/19/2002   Procedure: LEFT HEART CATH AND CORS/GRAFTS ANGIOGRAPHY; Location: [facility]; Surgeon: Katrine Coho, MD   LEFT HEART CATH AND CORS/GRAFTS ANGIOGRAPHY Left 09/17/2003   Procedure: LEFT HEART CATH AND CORS/GRAFTS ANGIOGRAPHY; Location: [facility]; Surgeon: Katrine Coho, MD   LOWER EXTREMITY ANGIOGRAPHY Right 01/06/2022   Procedure: Lower Extremity Angiography;  Surgeon: Katha Cabal, MD;  Location: [facility];  Service: Cardiovascular;  Laterality: Right;   RENAL ARTERY ANGIOPLASTY Bilateral 12/2013   TOE AMPUTATION Right    small toe   TONSILLECTOMY AND ADENOIDECTOMY     TOTAL HIP ARTHROPLASTY Left 2005   TOTAL HIP ARTHROPLASTY Right 2015   UPPER EXTREMITY ANGIOGRAPHY Left 11/23/2021   Procedure: UPPER EXTREMITY ANGIOGRAPHY;  Surgeon: Algernon Huxley, MD;  Location: [facility];  Service: Cardiovascular;  Laterality: Left;   Family History  Problem Relation Age of Onset   Stroke Mother    Hypertension Mother    Diabetes Mother    Hypertension Father    Heart disease Sister        MI   Multiple sclerosis Daughter    Multiple sclerosis Son    Cerebral aneurysm Son    Seizures Son    Cerebral aneurysm Son    Breast cancer Paternal Aunt 80   Social History:  reports that she quit smoking about 23 years ago. Her smoking use included cigarettes. She has never used smokeless tobacco. She reports that she does not drink alcohol and does not use drugs. Allergies:   Allergies  Allergen Reactions   Citalopram Anaphylaxis    Throat closing    Dilaudid [Hydromorphone Hcl] Nausea And Vomiting   Hydrochlorothiazide Other (See Comments)    Decreased GFR (Nov 2015)   Liothyronine     Hair fell out, caused headaches    Nsaids     CKD stage III - avoid nephrotoxic drugs   Nubain [Nalbuphine Hcl]     Burning sensation in back   Penicillins Itching   Prasugrel Itching   Statins Itching   Medications Prior to Admission  Medication Sig Dispense Refill   acetaminophen (TYLENOL) 325 MG tablet Take 1-2 tablets (325-650 mg total) by mouth every 4 (four) hours as needed for mild pain (or temp >/= 101 F).     aspirin 81 MG tablet Take 81 mg by mouth daily.     clopidogrel (PLAVIX) 75 MG tablet Take 1 tablet (75 mg total) by mouth daily. To prevent strokes (Patient taking differently: Take 75 mg by mouth in the morning and at bedtime. To prevent strokes) 90 tablet 1   docusate sodium (COLACE) 100 MG  capsule Take 1 capsule (100 mg total) by mouth daily. 10 capsule 0   ezetimibe (ZETIA) 10 MG tablet Take 1 tablet (10 mg total) by mouth daily. 90 tablet 1   ferrous sulfate 325 (65 FE) MG tablet Take 1 tablet (325 mg total) by mouth daily with breakfast.  3   nortriptyline (PAMELOR) 10 MG capsule Take 1 capsule (10 mg total) by mouth 2 (two) times daily. For chronic headaches 180 capsule 1   oxyCODONE-acetaminophen (PERCOCET/ROXICET) 5-325 MG tablet Take 1-2 tablets by mouth every 4 (four) hours as needed for moderate pain. 30 tablet 0   ranolazine (RANEXA) 500 MG 12 hr tablet Take 1 tablet (500 mg total) by mouth 2 (two) times daily. 60 tablet 3   amLODipine (NORVASC) 10 MG tablet Take 1 tablet (10 mg total) by mouth daily. (Patient not taking: Reported on 03/29/2022)     labetalol (NORMODYNE) 300 MG tablet Take 1 tablet (300 mg total) by mouth 2 (two) times daily. For hypertension 180 tablet 1   spironolactone (ALDACTONE) 25 MG tablet Take 1 tablet (25 mg total) by  mouth daily. (Patient not taking: Reported on 03/29/2022)        Home: Home Living Family/patient expects to be discharged to:: Inpatient rehab Living Arrangements: Alone Available Help at Discharge: Family, Available 24 hours/day (daughter, [name], from [place] here  to [employer] as needed) Type of Home: House Home Access: Level entry Home Layout: One level Bathroom Shower/Tub: Public librarian, Multimedia programmer: Standard Bathroom Accessibility: Yes Home Equipment: Conservation officer, nature (2 wheels), [employer] - single point Additional Comments: Patient stating Daughter and SIL will be staying with her for a few days after she returns from [place] With: Alone   Functional History: Prior Function Prior Level of Function : Independent/Modified Independent, Driving Mobility Comments: Typically independent without DME. ADLs Comments: Prior to initial admission, indep with cooking, cleaning, bathing, dressing.  Continues to drive.  No assist with bill payment or medication management.  Functional Status:  Mobility: Bed Mobility Overal bed mobility: Needs Assistance Bed Mobility: Supine to Sit Supine to sit: Mod assist General bed mobility comments: up in the recliner Transfers Overall transfer level: Needs assistance Equipment used: Rolling walker (2 wheels) Transfers: Sit to/from Stand Sit to Stand: Min assist Bed to/from chair/wheelchair/BSC transfer type:: Step pivot Step pivot transfers: Min assist General transfer comment: increased time and effort, minA for trunk elevation and stability with pivotal steps from bed to recliner with RW; further distance limited by c/o significant pain      ADL: ADL Grooming: Wash/dry hands, Wash/dry face, Set up, Sitting Upper Body Bathing: Set up, Sitting Lower Body Bathing: Moderate assistance, Sit to/from stand Upper Body Dressing : Set up, Sitting Lower Body Dressing: Moderate assistance, Sit to/from stand Toilet Transfer: Minimal  assistance, Stand-pivot, BSC/3in1  Cognition: Cognition Overall Cognitive Status: Within Functional Limits for tasks assessed Arousal/Alertness: Awake/alert Orientation Level: Oriented X4 Cognition Arousal/Alertness: Awake/alert Behavior During Therapy: WFL for tasks assessed/performed, Flat affect Overall Cognitive Status: Within Functional Limits for tasks assessed  Physical Exam: Blood pressure (!) 99/43, pulse 68, temperature 98.2 F (36.8 C), temperature source Oral, resp. rate 17, height 5\' 1"  (1.549 m), weight 73.2 kg, SpO2 100 %. Physical Exam Skin:    Comments: Groin incision is dressed with wound VAC in place  Neurological:     Comments: Patient is alert.  Mood is a bit flat but appropriate.  Oriented x3 and follows commands.     Results for  orders placed or performed during the hospital encounter of 03/29/22 (from the past 48 hour(s))  Type and screen [place]     Status: None (Preliminary result)   Collection Time: 03/30/22  7:30 AM  Result Value Ref Range   ABO/RH(D) B POS    Antibody Screen NEG    Sample Expiration 04/02/2022,2359    Unit Number [id number]    Blood Component Type RED CELLS,LR    Unit division 00    Status of Unit ISSUED,FINAL    Transfusion Status OK TO TRANSFUSE    Crossmatch Result Compatible    Unit Number [id number]    Blood Component Type RED CELLS,LR    Unit division 00    Status of Unit ISSUED,FINAL    Transfusion Status OK TO TRANSFUSE    Crossmatch Result      Compatible Performed at [facility], [address], [place], [place] [address]    Unit Number [id number]    Blood Component Type RED CELLS,LR    Unit division 00    Status of Unit ALLOCATED    Transfusion Status OK TO TRANSFUSE    Crossmatch Result Compatible    Unit Number [id number]    Blood Component Type RED CELLS,LR    Unit division 00    Status of Unit ALLOCATED    Transfusion Status OK TO TRANSFUSE    Crossmatch Result  Compatible   Aerobic/Anaerobic Culture w Gram Stain (surgical/deep wound)     Status: None (Preliminary result)   Collection Time: 03/30/22  8:49 AM   Specimen: PATH Cytology Misc. fluid; Body Fluid  Result Value Ref Range   Specimen Description TISSUE    Special Requests RIGHT GROIN    Gram Stain      FEW WBC PRESENT, PREDOMINANTLY PMN NO ORGANISMS SEEN    Culture      RARE PSEUDOMONAS AERUGINOSA CULTURE REINCUBATED FOR BETTER GROWTH Performed at [facility], [address], [place], [place] [address]    Report Status PENDING   POCT Activated clotting time     Status: None   Collection Time: 03/30/22  9:24 AM  Result Value Ref Range   Activated Clotting Time 227 seconds    Comment: Reference range 74-137 seconds for patients not on anticoagulant therapy.  I-STAT 7, (LYTES, BLD GAS, ICA, H+H)     Status: Abnormal   Collection Time: 03/30/22  9:50 AM  Result Value Ref Range   pH, Arterial 7.457 (H) 7.35 - 7.45   pCO2 arterial 30.7 (L) 32 - 48 mmHg   pO2, Arterial 266 (H) 83 - 108 mmHg   Bicarbonate 21.8 20.0 - 28.0 mmol/L   TCO2 23 22 - 32 mmol/L   O2 Saturation 100 %   Acid-base deficit 2.0 0.0 - 2.0 mmol/L   Sodium 136 135 - 145 mmol/L   Potassium 4.0 3.5 - 5.1 mmol/L   Calcium, Ion 1.20 1.15 - 1.40 mmol/L   HCT 19.0 (L) 36.0 - 46.0 %   Hemoglobin 6.5 (LL) 12.0 - 15.0 g/dL   Patient temperature 36.2 C    Sample type ARTERIAL    Comment NOTIFIED PHYSICIAN   Prepare RBC (crossmatch)     Status: None   Collection Time: 03/30/22  9:53 AM  Result Value Ref Range   Order Confirmation      ORDER PROCESSED BY BLOOD BANK Performed at [facility], [place] [address] [place], [place] [address]   Fibrinogen (coagulopathy lab panel)  Status: Abnormal   Collection Time: 03/30/22 10:05 AM  Result Value Ref Range   Fibrinogen 496 (H) 210 - 475 mg/dL    Comment: (NOTE) Fibrinogen results may be underestimated in patients receiving thrombolytic therapy. Performed at  [facility], [place] [address] [place], [place] [address]   Platelet count (coagulopathy lab panel)     Status: None   Collection Time: 03/30/22 10:05 AM  Result Value Ref Range   Platelets 322 150 - 400 K/uL    Comment: Performed at [facility], [place] [address], [place], [place] [address]  Hemoglobin and hematocrit, blood (coagulopathy lab panel)     Status: Abnormal   Collection Time: 03/30/22 10:05 AM  Result Value Ref Range   Hemoglobin 6.3 (LL) 12.0 - 15.0 g/dL    Comment: REPEATED TO VERIFY THIS CRITICAL RESULT HAS VERIFIED AND BEEN CALLED TO T,SUMNER RN BY ELYSE BENTON ON 06 06 2023 AT 1039, AND HAS BEEN READ BACK.     HCT 19.1 (L) 36.0 - 46.0 %    Comment: Performed at [facility], [place] [address] [place], [place] [address]  Protime-INR (coagulopathy lab panel)     Status: Abnormal   Collection Time: 03/30/22 10:05 AM  Result Value Ref Range   Prothrombin Time 16.0 (H) 11.4 - 15.2 seconds   INR 1.3 (H) 0.8 - 1.2    Comment: (NOTE) INR goal varies based on device and disease states. Performed at [facility], [place] [address] [place], [place] [address]   APTT (coagulopathy lab panel)     Status: None   Collection Time: 03/30/22 10:05 AM  Result Value Ref Range   aPTT 35 24 - 36 seconds    Comment: Performed at [facility] [address], [place], [place] [address]  CBC     Status: Abnormal   Collection Time: 03/30/22  3:27 PM  Result Value Ref Range   WBC 15.1 (H) 4.0 - 10.5 K/uL   RBC 3.01 (L) 3.87 - 5.11 MIL/uL   Hemoglobin 9.0 (L) 12.0 - 15.0 g/dL    Comment: REPEATED TO VERIFY POST TRANSFUSION SPECIMEN    HCT 26.1 (L) 36.0 - 46.0 %   MCV 86.7 80.0 - 100.0 fL   MCH 29.9 26.0 - 34.0 pg   MCHC 34.5 30.0 - 36.0 g/dL   RDW 14.0 11.5 - 15.5 %   Platelets 290 150 - 400 K/uL   nRBC 0.0 0.0 - 0.2 %    Comment: Performed at [facility], [place] [address] [place], [place] [address]  Basic metabolic panel     Status: Abnormal    Collection Time: 03/30/22  3:27 PM  Result Value Ref Range   Sodium 135 135 - 145 mmol/L   Potassium 4.0 3.5 - 5.1 mmol/L   Chloride 107 98 - 111 mmol/L   CO2 22 22 - 32 mmol/L   Glucose, Bld 126 (H) 70 - 99 mg/dL    Comment: Glucose reference range applies only to samples taken after fasting for at least 8 hours.   BUN 21 8 - 23 mg/dL   Creatinine, Ser 1.61 (H) 0.44 - 1.00 mg/dL   Calcium 8.0 (L) 8.9 - 10.3 mg/dL   GFR, Estimated 32 (L) >60 mL/min    Comment: (NOTE) Calculated using the CKD-EPI Creatinine Equation (2021)    Anion gap 6 5 - 15    Comment: Performed at [facility]  [address], [place], [place] [address]  Fibrinogen     Status: None   Collection Time: 03/30/22  3:27 PM  Result Value Ref Range   Fibrinogen 448 210 - 475 mg/dL    Comment: (NOTE) Fibrinogen results may be underestimated in patients receiving thrombolytic therapy. Performed at [facility], [place] [address] [place], [place] [address]   Magnesium     Status: None   Collection Time: 03/30/22  3:27 PM  Result Value Ref Range   Magnesium 1.7 1.7 - 2.4 mg/dL    Comment: Performed at [facility], [place] [address], [place], [place] [address]  Protime-INR     Status: Abnormal   Collection Time: 03/30/22  3:27 PM  Result Value Ref Range   Prothrombin Time 15.6 (H) 11.4 - 15.2 seconds   INR 1.3 (H) 0.8 - 1.2    Comment: (NOTE) INR goal varies based on device and disease states. Performed at [facility], [place] [address], [place], [place] [address]   APTT     Status: None   Collection Time: 03/30/22  3:27 PM  Result Value Ref Range   aPTT 34 24 - 36 seconds    Comment: Performed at [facility] [address] [place], [place] [address]  Troponin I (High Sensitivity)     Status: Abnormal   Collection Time: 03/30/22  3:27 PM  Result Value Ref Range   Troponin I (High Sensitivity) 28 (H) <18 ng/L    Comment: (NOTE) Elevated high sensitivity troponin I (hsTnI) values  and significant  changes across serial measurements may suggest ACS but many other  chronic and acute conditions are known to elevate hsTnI results.  Refer to the "Links" section for chest pain algorithms and additional  guidance. Performed at [facility], [place] [address], [place], [place] [address]   Hemoglobin and hematocrit, blood     Status: Abnormal   Collection Time: 03/30/22  3:27 PM  Result Value Ref Range   Hemoglobin 9.2 (L) 12.0 - 15.0 g/dL    Comment: REPEATED TO VERIFY POST TRANSFUSION SPECIMEN    HCT 25.9 (L) 36.0 - 46.0 %    Comment: Performed at [facility] [address], [place], [place] [address]  Lactic acid, plasma     Status: None   Collection Time: 03/30/22  3:27 PM  Result Value Ref Range   Lactic Acid, Venous 1.0 0.5 - 1.9 mmol/L    Comment: Performed at [facility] [address] [place],  [address]  CBC     Status: Abnormal   Collection Time: 03/31/22 12:13 AM  Result Value Ref Range   WBC 12.4 (H) 4.0 - 10.5 K/uL   RBC 3.00 (L) 3.87 - 5.11 MIL/uL   Hemoglobin 8.8 (L) 12.0 - 15.0 g/dL   HCT 26.5 (L) 36.0 - 46.0 %   MCV 88.3 80.0 - 100.0 fL   MCH 29.3 26.0 - 34.0 pg   MCHC 33.2 30.0 - 36.0 g/dL   RDW 14.3 11.5 - 15.5 %   Platelets 296 150 - 400 K/uL   nRBC 0.0 0.0 - 0.2 %    Comment: Performed at [facility], [place] [address] [place] [address]  CBC     Status: Abnormal   Collection Time: 03/31/22  6:30 AM  Result Value Ref Range   WBC 13.3 (H) 4.0 - 10.5 K/uL   RBC 2.89 (L) 3.87 - 5.11  MIL/uL   Hemoglobin 8.4 (L) 12.0 - 15.0 g/dL   HCT 25.4 (L) 36.0 - 46.0 %   MCV 87.9 80.0 - 100.0 fL   MCH 29.1 26.0 - 34.0 pg   MCHC 33.1 30.0 - 36.0 g/dL   RDW 14.1 11.5 - 15.5 %   Platelets 321 150 - 400 K/uL   nRBC 0.0 0.0 - 0.2 %    Comment: Performed at [facility] [address] [place], [place] [address]  Basic metabolic panel     Status: Abnormal   Collection Time: 03/31/22  6:30 AM  Result Value  Ref Range   Sodium 133 (L) 135 - 145 mmol/L   Potassium 4.4 3.5 - 5.1 mmol/L   Chloride 107 98 - 111 mmol/L   CO2 21 (L) 22 - 32 mmol/L   Glucose, Bld 111 (H) 70 - 99 mg/dL    Comment: Glucose reference range applies only to samples taken after fasting for at least 8 hours.   BUN 19 8 - 23 mg/dL   Creatinine, Ser 1.76 (H) 0.44 - 1.00 mg/dL   Calcium 8.4 (L) 8.9 - 10.3 mg/dL   GFR, Estimated 29 (L) >60 mL/min    Comment: (NOTE) Calculated using the CKD-EPI Creatinine Equation (2021)    Anion gap 5 5 - 15    Comment: Performed at [facility] [address], [place], [place] [address]  Magnesium     Status: Abnormal   Collection Time: 03/31/22  6:30 AM  Result Value Ref Range   Magnesium 2.5 (H) 1.7 - 2.4 mg/dL    Comment: Performed at [facility] [address], [place], [place] [address]  Phosphorus     Status: None   Collection Time: 03/31/22  6:30 AM  Result Value Ref Range   Phosphorus 3.6 2.5 - 4.6 mg/dL    Comment: Performed at [facility] [address] [place], [place] [address]  Protime-INR     Status: Abnormal   Collection Time: 03/31/22  6:30 AM  Result Value Ref Range   Prothrombin Time 15.7 (H) 11.4 - 15.2 seconds   INR 1.3 (H) 0.8 - 1.2    Comment: (NOTE) INR goal varies based on device and disease states. Performed at [facility], [place] [address], [place], [place] [address]   APTT     Status: Abnormal   Collection Time: 03/31/22  6:30 AM  Result Value Ref Range   aPTT 37 (H) 24 - 36 seconds    Comment:        IF BASELINE aPTT IS ELEVATED, SUGGEST PATIENT RISK ASSESSMENT BE USED TO DETERMINE APPROPRIATE ANTICOAGULANT THERAPY. Performed at [facility], [place] [address] [place], [place] [address]   Fibrinogen     Status: Abnormal   Collection Time: 03/31/22  6:30 AM  Result Value Ref Range   Fibrinogen 569 (H) 210 - 475 mg/dL    Comment: (NOTE) Fibrinogen results may be underestimated in patients  receiving thrombolytic therapy. Performed at [facility], [place] [address], [place] [address]   CBC     Status: Abnormal   Collection Time: 04/01/22 12:17 AM  Result Value Ref Range   WBC 14.3 (H) 4.0 - 10.5 K/uL   RBC 2.65 (L) 3.87 - 5.11 MIL/uL   Hemoglobin 8.0 (L) 12.0 - 15.0 g/dL   HCT 23.6 (L) 36.0 - 46.0 %   MCV 89.1 80.0 - 100.0 fL  MCH 30.2 26.0 - 34.0 pg   MCHC 33.9 30.0 - 36.0 g/dL   RDW 14.1 11.5 - 15.5 %   Platelets 273 150 - 400 K/uL   nRBC 0.0 0.0 - 0.2 %    Comment: Performed at [facility], [place] [address], [place], [place] [address]  Basic metabolic panel     Status: Abnormal   Collection Time: 04/01/22 12:17 AM  Result Value Ref Range   Sodium 133 (L) 135 - 145 mmol/L   Potassium 4.2 3.5 - 5.1 mmol/L   Chloride 105 98 - 111 mmol/L   CO2 21 (L) 22 - 32 mmol/L   Glucose, Bld 110 (H) 70 - 99 mg/dL    Comment: Glucose reference range applies only to samples taken after fasting for at least 8 hours.   BUN 24 (H) 8 - 23 mg/dL   Creatinine, Ser 2.11 (H) 0.44 - 1.00 mg/dL   Calcium 8.1 (L) 8.9 - 10.3 mg/dL   GFR, Estimated 23 (L) >60 mL/min    Comment: (NOTE) Calculated using the CKD-EPI Creatinine Equation (2021)    Anion gap 7 5 - 15    Comment: Performed at [facility] [address], [place], [place] [address]   No results found.    Blood pressure (!) 99/43, pulse 68, temperature 98.2 F (36.8 C), temperature source Oral, resp. rate 17, height 5\' 1"  (1.549 m), weight 73.2 kg, SpO2 100 %.  Medical Problem List and Plan: 1. Functional deficits secondary to debility secondary to peripheral vascular disease with debilitating claudication.  Status post bilateral endarterectomies with stent placement 03/17/2022 per Dr. Leotis Pain complicated by right groin wound infection status post right femoral exposure washout debridement redo patch plasty with ipsilateral GSV 03/30/2022.  Wound VAC is indicated x7 days through 04/06/2022  -patient  may *** shower  -ELOS/Goals: *** 2.  Antithrombotics: -DVT/anticoagulation:  Mechanical: Antiembolism stockings, thigh (TED hose) Bilateral lower extremities  -antiplatelet therapy: Aspirin 81 mg daily and Plavix 75 mg daily 3. Pain Management: Oxycodone as needed 4. Mood: Pamelor 10 mg twice daily.  Provide emotional support  -antipsychotic agents: N/A 5. Neuropsych: This patient is capable of making decisions on her own behalf. 6. Skin/Wound Care: Routine skin checks 7. Fluids/Electrolytes/Nutrition: Routine in and outs with follow-up chemistries 8.  ID.  Currently maintained on Zosyn for right groin wound infection.  Right groin wound culture rare Pseudomonas aeruginosa.  Will discuss with vascular team on antibiotic duration 9.  Postoperative cardiopulmonary arrest/non-STEMI with history of CAD/CABG.  Patient did receive CPR x2 minutes.  Follow-up cardiology service.  Continue Ranexa 500 mg twice daily.  Plan outpatient follow-up cardiology services 10.  Acute on chronic anemia.  Continue iron supplement.  Follow-up CBC 11.  CKD stage III.  Baseline creatinine 1.42-1.56.  Latest creatinine 2.11 follow-up chemistries 12.  Diastolic congestive heart failure.  Monitor for any signs of fluid overload 13.  Hypertension.  Normodyne 300 mg twice daily.  Monitor with increased mobility 14.  Hyperlipidemia.  Zetia 10 mg daily.    Lavon Paganini Tersea Aulds, PA-C 04/01/2022

## 2022-04-02 ENCOUNTER — Inpatient Hospital Stay (HOSPITAL_COMMUNITY): Payer: Medicare Other

## 2022-04-02 LAB — CBC
HCT: 23.5 % — ABNORMAL LOW (ref 36.0–46.0)
Hemoglobin: 7.6 g/dL — ABNORMAL LOW (ref 12.0–15.0)
MCH: 29.9 pg (ref 26.0–34.0)
MCHC: 32.3 g/dL (ref 30.0–36.0)
MCV: 92.5 fL (ref 80.0–100.0)
Platelets: 328 10*3/uL (ref 150–400)
RBC: 2.54 MIL/uL — ABNORMAL LOW (ref 3.87–5.11)
RDW: 14.2 % (ref 11.5–15.5)
WBC: 14.1 10*3/uL — ABNORMAL HIGH (ref 4.0–10.5)
nRBC: 0 % (ref 0.0–0.2)

## 2022-04-02 LAB — BASIC METABOLIC PANEL
Anion gap: 10 (ref 5–15)
BUN: 26 mg/dL — ABNORMAL HIGH (ref 8–23)
CO2: 20 mmol/L — ABNORMAL LOW (ref 22–32)
Calcium: 8.6 mg/dL — ABNORMAL LOW (ref 8.9–10.3)
Chloride: 108 mmol/L (ref 98–111)
Creatinine, Ser: 2.31 mg/dL — ABNORMAL HIGH (ref 0.44–1.00)
GFR, Estimated: 21 mL/min — ABNORMAL LOW (ref >60)
Glucose, Bld: 120 mg/dL — ABNORMAL HIGH (ref 70–99)
Potassium: 3.8 mmol/L (ref 3.5–5.1)
Sodium: 138 mmol/L (ref 135–145)

## 2022-04-02 LAB — GLUCOSE, CAPILLARY: Glucose-Capillary: 118 mg/dL — ABNORMAL HIGH (ref 70–99)

## 2022-04-02 MED ORDER — PIPERACILLIN-TAZOBACTAM IN DEX 2-0.25 GM/50ML IV SOLN
2.2500 g | Freq: Three times a day (TID) | INTRAVENOUS | Status: DC
  Administered 2022-04-02 – 2022-04-04 (×6): 2.25 g via INTRAVENOUS
  Filled 2022-04-02 (×8): qty 50

## 2022-04-02 MED ORDER — LABETALOL HCL 200 MG PO TABS
100.0000 mg | ORAL_TABLET | Freq: Two times a day (BID) | ORAL | Status: DC
  Administered 2022-04-02 – 2022-04-04 (×4): 100 mg via ORAL
  Filled 2022-04-02 (×4): qty 1

## 2022-04-02 NOTE — Progress Notes (Signed)
Occupational Therapy Treatment Patient Details Name: Sabrina [name] MRN: [id number] DOB: 1941-07-11 Today's Date: 04/02/2022   History of present illness Pt is an 81 y.o. female admitted from [facility] on 03/30/22 with postoperative R groin infection; s/p R groin wound I&D, common femoral repair with wound vac placement 6/6. Of note, pt with recent admission at [facility] 03/17/22-03/24/22 for elective bilateral femoral endarterectomy with post-op cardiac arrest; d/c to CIR for rehab. 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   OT comments  Patient with fair progress toward patient focused goals.  Patient continues to anticipate pain to her legs, but with increased time and sues, she is progressing to Min A with generalized mobility in the room, and Mod A for lower body ADL.  OT to continue efforts, with AIR planned for post acute rehab.     Recommendations for follow up therapy are one component of a multi-disciplinary discharge planning process, led by the attending physician.  Recommendations may be updated based on patient status, additional functional criteria and insurance authorization.    Follow Up Recommendations  Acute inpatient rehab (3hours/day)    Assistance Recommended at Discharge Frequent or constant Supervision/Assistance  Patient can return home with the following  Assistance with cooking/housework;A lot of help with bathing/dressing/bathroom;A little help with walking and/or transfers;Assist for transportation   Equipment Recommendations  BSC/3in1    Recommendations for Other Services Rehab consult    Precautions / Restrictions Precautions Precautions: Fall;Other (comment) Precaution Comments: R groin wound vac; urinary urgency/incontinence Restrictions Weight Bearing Restrictions: No       Mobility Bed Mobility Overal bed mobility: Needs Assistance Bed Mobility: Supine to  Sit     Supine to sit: Min assist          Transfers Overall transfer level: Needs assistance Equipment used: Rolling walker (2 wheels) Transfers: Sit to/from Stand Sit to Stand: Min assist                 Balance Overall balance assessment: Needs assistance Sitting-balance support: Feet supported, No upper extremity supported Sitting balance-Leahy Scale: Poor   Postural control: Right lateral lean, Posterior lean Standing balance support: Reliant on assistive device for balance Standing balance-Leahy Scale: Fair                             ADL either performed or assessed with clinical judgement   ADL       Grooming: Wash/dry hands;Wash/dry face;Set up;Sitting   Upper Body Bathing: Sitting;Minimal assistance   Lower Body Bathing: Moderate assistance;Sit to/from stand   Upper Body Dressing : Sitting;Minimal assistance Upper Body Dressing Details (indicate cue type and reason): IV lines and wrap to R forearm Lower Body Dressing: Moderate assistance;Sit to/from stand   Toilet Transfer: Minimal assistance;Stand-pivot;BSC/3in1                  Extremity/Trunk Assessment Upper Extremity Assessment Upper Extremity Assessment: Generalized weakness   Lower Extremity Assessment Lower Extremity Assessment: Generalized weakness   Cervical / Trunk Assessment Cervical / Trunk Assessment: Normal                      Cognition Arousal/Alertness: Awake/alert Behavior During Therapy: WFL for tasks assessed/performed, Flat affect Overall Cognitive Status: Within Functional Limits for tasks assessed  General Comments: safety cues as needed        Exercises      Shoulder Instructions       General Comments      Pertinent Vitals/ Pain       Pain Assessment Pain Assessment: Faces Faces Pain Scale: Hurts little more Pain Location: R groin wound Pain Descriptors / Indicators: Guarding,  Grimacing Pain Intervention(s): Monitored during session                                                          Frequency  Min 2X/week        Progress Toward Goals  OT Goals(current goals can now be found in the care plan section)  Progress towards OT goals: Progressing toward goals  Acute Rehab OT Goals OT Goal Formulation: With patient Time For Goal Achievement: 04/14/22 Potential to Achieve Goals: Good  Plan Discharge plan remains appropriate    Co-evaluation                 AM-PAC OT "6 Clicks" Daily Activity     Outcome Measure   Help from another person eating meals?: None Help from another person taking care of personal grooming?: A Little Help from another person toileting, which includes using toliet, bedpan, or urinal?: A Little Help from another person bathing (including washing, rinsing, drying)?: A Lot Help from another person to put on and taking off regular upper body clothing?: A Little Help from another person to put on and taking off regular lower body clothing?: A Lot 6 Click Score: 17    End of Session Equipment Utilized During Treatment: Rolling walker (2 wheels)  OT Visit Diagnosis: Unsteadiness on feet (R26.81);Pain Pain - Right/Left: Right Pain - part of body: Leg   Activity Tolerance Patient limited by pain   Patient Left in chair;with call bell/phone within reach;with family/visitor present   Nurse Communication Mobility status        Time: 1140-1210 OT Time Calculation (min): 30 min  Charges: OT General Charges $OT Visit: 1 Visit OT Treatments $Self Care/Home Management : 23-37 mins  04/02/2022  RP, OTR/L  Acute Rehabilitation Services  Office:  [phone]   Metta Clines 04/02/2022, 12:26 PM

## 2022-04-02 NOTE — Progress Notes (Addendum)
Pt is alert and  fully oriented x 4. She is hemodynamically stable, NSR on the monitor, afebrile, on room air SPO2 98-99% with no acute distress noted.   Her major complaints tonight are feeling uncomfortable, generalized pain and pain on her incision on right groin. She requests percocet PRN q 4 hrs as needed. Position changing is encouraged to prevent pressure sore. She is able to tolerate pain and sleep well after med given.   Wound vac with -125 has serosanguinous drainage, no active bleeding is noted.   Good signals with doppler on DP and PT pulses bilaterally.    Urine is dark tea color via female external urine catheter ( PureWick). Total 24 hours urine output 600 ml. Continue IV fluid NSS 75 ml per hour and oral fluid intake is encouraged. We will continue to monitor.  Kennyth Lose, RN

## 2022-04-02 NOTE — Progress Notes (Signed)
  Transition of Care (TOC) Screening Note   Patient Details  Name: Sabrina [name] Date of Birth: [birth year]   Transition of Care [facility]) CM/SW Contact:    Dawayne Patricia, RN Phone Number: 04/02/2022, 10:41 AM    Transition of Care Department [facility]) has reviewed patient and note patient readmitted from [facility] rehab s/p Incision and debridement of the right groin wound with plan for wound VAC change first of next week. CIR following for return to rehab.  We will continue to monitor patient advancement through interdisciplinary progression rounds. If new patient transition needs arise, please place a TOC consult.

## 2022-04-02 NOTE — Progress Notes (Signed)
PROGRESS NOTE  Sabrina [name]  DOB: December 24, 1940  PCP: Crecencio Mc, MD ZDG:[id number]  DOA: 03/29/2022  LOS: 4 days  Hospital Day: 5  Brief narrative: Sabrina [name] is a 81 y.o. female with PMH significant for HTN, HLD, CAD (s/p 3v-CABG 2001), CAS/PVD (s/p L CEA 2004), abdominal aortic ectasia, mild AS, chronic diastolic CHF (Echo 06/5187 with EF > 55%, G1DD), pulmonary HTN, CKD stage III, PAD status post multiple revascularization procedures, cervical spondylosis. 5/24, patient underwent bilateral superior femoral artery and left iliac stenting for debilitating claudication at [facility]  Postoperatively, patient had bradycardic cardiac arrest requiring CPR for about 2 minutes and epi.  Her troponin was elevated to over 15,000, but echo did not show any wall motion abnormality or drop in EF.  She was seen by cardiology, placed on aspirin, Plavix, Ranexa. 5/30, patient was discharged to inpatient rehab at [facility].  It appears that the wound culture obtained on 5/30 on the day of discharge grew out moderate Proteus mirabilis and rare Klebsiella pneumoniae but patient was not started on antibiotic for days.   At [facility], patient started having nausea, vomiting, diarrhea as well as drainage from the right inguinal surgical wound.  Cultures were obtained, patient was started on IV antibiotics.  Labs showed worsening leukocytosis and creatinine.  Vascular surgery consultation was obtained and per recommendation, patient was admitted to [facility] on 6/5. 6/5, patient underwent IND of groin wound, she had frank pus surrounding the prior endarterectomy site with concern for arterial patch infection.  It was removed and replaced with new venous patch and wound VAC was placed.  Patient required Neo-Synephrine for BP support throughout the case.  Postoperatively, hemoglobin is noted to be low at 6.5 and 1 unit of PRBC was transfused Patient was monitored in ICU for next 48 hours. 6/8, transferred out to [facility]. See below  for more detail  Subjective: Patient was seen and examined this morning.  Pleasant elderly African-American female.  Sitting up in chair.  Not in distress.  Not on supplemental oxygen.  Family not at bedside today.   Remains on IV fluid.  Labs this morning with creatinine further elevated.   Principal Problem:   Postoperative wound infection Active Problems:   Acute kidney injury superimposed on chronic kidney disease (HCC)   HLD (hyperlipidemia)   Peripheral vascular disease (HCC)   Generalized anxiety disorder   Acute blood loss anemia   Myocardial infarction due to demand ischemia [facility])    Assessment and plan: S/p bilateral femoral endarterectomies 03/17/2022 Postoperative wound infection S/p I&D of R groin endarterectomy incision - 6/6 -Vascular surgery following. -Wound culture sent from the OR is growing but if Pseudomonas is anything, pansensitive. -Currently on IV Zosyn  Septic shock due to wound infection History of hypertension -Patient was hypotensive throughout the surgery and in ICU requiring Neo-Synephrine -Hemodynamically stable now.  Resume normal saline at 75 mill per hour.   -PTA, patient was on labetalol, amlodipine, Aldactone. -Currently on labetalol 300 mg twice daily.  Amlodipine and Aldactone on hold.  Blood pressure running low normal range with worsening AKI.  I would minimize the dose of labetalol 200 mg daily.  Continue to monitor blood pressure Recent Labs  Lab 03/30/22 1527 03/31/22 0013 03/31/22 0630 04/01/22 0017 04/02/22 0822  WBC 15.1* 12.4* 13.3* 14.3* 14.1*  LATICACIDVEN 1.0  --   --   --   --     AKI on CKD 3B -Baseline creatinine less than 1.5  -Creatinine trended  up because of sepsis.   -Creatinine continues to worsen today. -Continue IV fluid with NS at 100 mill per hour.  May make the dose of labetalol as mentioned above.  Repeat renal function tomorrow.  Obtain renal ultrasound. -Nephrotoxic meds on hold.  Continue  monitor Recent Labs    03/25/22 0524 03/26/22 0540 03/27/22 1610 03/29/22 0616 03/29/22 1210 03/30/22 0343 03/30/22 1527 03/31/22 0630 04/01/22 0017 04/02/22 0822  BUN 25* 24* 26* 24* 27* 24* 21 19 24* 26*  CREATININE 1.89* 1.77* 1.89* 1.91* 2.14* 1.90* 1.61* 1.76* 2.11* 2.31*     Hyponatremia -Sodium level is low at 133, improved today.    Peripheral artery disease  S/p bilateral lower extremity endarterectomy, L CEA for CAS in 2004 Recent postop ardiac arrest secondary to NSTEMI Hyperlipidemia -Continue aspirin, Plavix, Ranexa, Zetia.  Mentioned allergy to statin.   Acute blood loss anemia Anemia chronic disease -Baseline hemoglobin less than 11.  Hemoglobin was at the lowest of 6.3 on 6/6.  2 units PRBCs were transfused.  Noted a gradual drop in last last 4 days.  Continue to monitor.   -Continue oral iron supplement. Recent Labs    03/08/22 1406 03/17/22 1406 03/30/22 1527 03/31/22 0013 03/31/22 0630 04/01/22 0017 04/02/22 0822  HGB 10.7*   < > 9.0*  9.2* 8.8* 8.4* 8.0* 7.6*  MCV 88.8   < > 86.7 88.3 87.9 89.1 92.5  FERRITIN 89  --   --   --   --   --   --   TIBC 293  --   --   --   --   --   --   IRON 81  --   --   --   --   --   --    < > = values in this interval not displayed.    Anxiety and depression -Continue nortriptyline   Goals of care   Code Status: Full Code    Mobility: PT  Skin assessment:     Nutritional status:  Body mass index is 29.87 kg/m.          Diet:  Diet Order             Diet Heart Room service appropriate? Yes; Fluid consistency: Thin  Diet effective now                   DVT prophylaxis:  SCDs Start: 03/30/22 1201 Place TED hose Start: 03/29/22 1540   Antimicrobials: IV Zosyn Fluid: Restart NS at 100 mill per hour. Consultants: Vascular surgery, ID Family Communication: Family not at bedside  Status is: Inpatient  Continue in-hospital care because: Needs IV antibiotics, vascular surgery  follow-up, labs monitoring Level of care: Telemetry Medical   Dispo: The patient is from: Inpatient rehab              Anticipated d/c is to: Pending clinical course.  Hopefully back to inpatient rehab early next week.              Patient currently is not medically stable to d/c.   Difficult to place patient No     Infusions:   sodium chloride 10 mL/hr at 04/01/22 2100   sodium chloride 75 mL/hr at 04/02/22 0543   piperacillin-tazobactam (ZOSYN)  IV      Scheduled Meds:  sodium chloride   Intravenous Once   sodium chloride   Intravenous Once   aspirin  81 mg Oral Daily   Chlorhexidine Gluconate Cloth  6 each Topical Daily   clopidogrel  75 mg Oral Daily   ezetimibe  10 mg Oral Daily   ferrous sulfate  325 mg Oral Q breakfast   labetalol  300 mg Oral BID   mupirocin ointment  1 application  Nasal BID   nortriptyline  10 mg Oral BID   ranolazine  500 mg Oral BID    PRN meds: sodium chloride, acetaminophen **OR** acetaminophen, alum & mag hydroxide-simeth, docusate sodium, labetalol, oxyCODONE-acetaminophen, polyethylene glycol   Antimicrobials: Anti-infectives (From admission, onward)    Start     Dose/Rate Route Frequency Ordered Stop   04/02/22 1400  piperacillin-tazobactam (ZOSYN) IVPB 2.25 g        2.25 g 100 mL/hr over 30 Minutes Intravenous Every 8 hours 04/02/22 1347     03/30/22 1445  piperacillin-tazobactam (ZOSYN) IVPB 3.375 g  Status:  Discontinued        3.375 g 12.5 mL/hr over 240 Minutes Intravenous Every 8 hours 03/30/22 1351 04/02/22 1347   03/30/22 0958  ceFAZolin 1 g / gentamicin 80 mg in NS 500 mL surgical irrigation  Status:  Discontinued          As needed 03/30/22 0959 03/30/22 1108   03/30/22 0815  piperacillin-tazobactam (ZOSYN) IVPB 3.375 g        3.375 g 100 mL/hr over 30 Minutes Intravenous  Once 03/30/22 0813 03/30/22 0852   03/29/22 1800  cefTRIAXone (ROCEPHIN) 2 g in sodium chloride 0.9 % 100 mL IVPB  Status:  Discontinued        2 g 200  mL/hr over 30 Minutes Intravenous Every 24 hours 03/29/22 1550 03/30/22 1351   03/29/22 1800  metroNIDAZOLE (FLAGYL) IVPB 500 mg  Status:  Discontinued        500 mg 100 mL/hr over 60 Minutes Intravenous Every 12 hours 03/29/22 1550 03/30/22 1351       Objective: Vitals:   04/02/22 0509 04/02/22 0709  BP: (!) 118/41 (!) 114/43  Pulse: 69 74  Resp: 16 19  Temp: 97.9 F (36.6 C) 97.9 F (36.6 C)  SpO2: 98% 93%    Intake/Output Summary (Last 24 hours) at 04/02/2022 1433 Last data filed at 04/02/2022 0552 Gross per 24 hour  Intake 1801.77 ml  Output 200 ml  Net 1601.77 ml    Filed Weights   03/30/22 0433 03/30/22 0442 04/02/22 0509  Weight: 73.2 kg 73.2 kg 71.7 kg   Weight change:  Body mass index is 29.87 kg/m.   Physical Exam: General exam: Pleasant, elderly African-American female.  Sitting up in chair.  Not in distress Skin: No rashes, lesions or ulcers. HEENT: Atraumatic, normocephalic, no obvious bleeding Lungs: Clear to auscultation bilaterally CVS: Regular rate and rhythm, no murmur GI/Abd soft, nontender, nondistended, bowel sound present CNS: Alert, awake, oriented x3 Psychiatry: Mood appropriate Extremities: No pedal edema, no evidence  Data Review: I have personally reviewed the laboratory data and studies available.  F/u labs ordered Unresulted Labs (From admission, onward)     Start     Ordered   04/03/22 1937  Basic metabolic panel  Daily,   R     Question:  Specimen collection method  Answer:  Lab=Lab collect   04/02/22 0745   04/03/22 0500  CBC with Differential/Platelet  Daily,   R     Question:  Specimen collection method  Answer:  Lab=Lab collect   04/02/22 0745            Signed, Terrilee Croak, MD  [facility] 04/02/2022

## 2022-04-02 NOTE — Progress Notes (Signed)
Inpatient Rehabilitation Admissions Coordinator   Discussed with Dr Virl Cagey, await Wound Vac change Monday before proceeding with possible readmit to CIR next week. I will follow up on Monday.  Danne Baxter, RN, MSN Rehab Admissions Coordinator [phone] 04/02/2022 10:19 AM

## 2022-04-02 NOTE — Progress Notes (Signed)
Mobility Specialist: Progress Note   04/02/22 1538  Mobility  Activity Ambulated with assistance to bathroom  Level of Assistance Minimal assist, patient does 75% or more  Assistive Device Front wheel walker  Distance Ambulated (ft) 40 ft  Activity Response Tolerated well  $Mobility charge 1 Mobility   Pre-Mobility: 83 HR Post-Mobility: 72 HR  Pt received in the chair and agreeable to ambulation. Pt requesting to use BR prior to session, BM successful. After BR pt assist back to bed d/t needing to go down for ultrasound. Will f/u as able.   [facility] Lacee Grey Mobility Specialist Mobility Specialist [address]: [phone]

## 2022-04-02 NOTE — Progress Notes (Addendum)
[facility] [place]  Subjective  - Sore at incision and ribs   Objective (!) 118/41 69 97.9 F (36.6 C) (Oral) 16 98%  Intake/Output Summary (Last 24 hours) at 04/02/2022 0710 Last data filed at 04/02/2022 2671 Gross per 24 hour  Intake 2161.77 ml  Output 600 ml  Net 1561.77 ml    Cardiac RRR Lungs non labored breathing Right groin wound vac with good seal  Doppler PT/DP intact, neuro intact to touch  Assessment/Planning: POD # 3  81 y.o. female is s/p Incision and debridement of the right groin wound Reexposure of the common femoral artery Common femoral artery patch excision Common femoral patch plasty using ipsilateral greater saphenous vein Myriad Morcel placement VAC placement  Making slow progress.  Encouraged mobility Well perfused LE with good doppler signals Vac dressing will be maintained for 7 days total plan for change 04/06/22 Cultures with few pseudomonas day 3 pending final growth Cont. Zosyn   Roxy Horseman 04/02/2022 7:10 AM --  VASCULAR STAFF ADDENDUM: I have independently interviewed and examined the patient. I agree with the above.  Pt in slightly more pain today Overall improving and remains optimistic. Dressing down early next week.  Pending continued improvement, and wound healing, possible CIR later next week  [name], MD [facility] Phone Number: [phone] 04/02/2022 9:20 AM    Laboratory Lab Results: Recent Labs    03/31/22 0630 04/01/22 0017  WBC 13.3* 14.3*  HGB 8.4* 8.0*  HCT 25.4* 23.6*  PLT 321 273   BMET Recent Labs    03/31/22 0630 04/01/22 0017  NA 133* 133*  K 4.4 4.2  CL 107 105  CO2 21* 21*  GLUCOSE 111* 110*  BUN 19 24*  CREATININE 1.76* 2.11*  CALCIUM 8.4* 8.1*    COAG Lab Results  Component Value Date   INR 1.3 (H) 03/31/2022   INR 1.3 (H) 03/30/2022   INR 1.3 (H) 03/30/2022   No results found for: "PTT"

## 2022-04-03 DIAGNOSIS — D62 Acute posthemorrhagic anemia: Secondary | ICD-10-CM

## 2022-04-03 LAB — TYPE AND SCREEN
ABO/RH(D): B POS
Antibody Screen: NEGATIVE
Unit division: 0
Unit division: 0
Unit division: 0
Unit division: 0

## 2022-04-03 LAB — BASIC METABOLIC PANEL
Anion gap: 8 (ref 5–15)
BUN: 21 mg/dL (ref 8–23)
CO2: 19 mmol/L — ABNORMAL LOW (ref 22–32)
Calcium: 8.2 mg/dL — ABNORMAL LOW (ref 8.9–10.3)
Chloride: 112 mmol/L — ABNORMAL HIGH (ref 98–111)
Creatinine, Ser: 2.06 mg/dL — ABNORMAL HIGH (ref 0.44–1.00)
GFR, Estimated: 24 mL/min — ABNORMAL LOW (ref >60)
Glucose, Bld: 113 mg/dL — ABNORMAL HIGH (ref 70–99)
Potassium: 4.2 mmol/L (ref 3.5–5.1)
Sodium: 139 mmol/L (ref 135–145)

## 2022-04-03 LAB — CBC WITH DIFFERENTIAL/PLATELET
Abs Immature Granulocytes: 0.11 10*3/uL — ABNORMAL HIGH (ref 0.00–0.07)
Basophils Absolute: 0 10*3/uL (ref 0.0–0.1)
Basophils Relative: 0 %
Eosinophils Absolute: 1.1 10*3/uL — ABNORMAL HIGH (ref 0.0–0.5)
Eosinophils Relative: 10 %
HCT: 22.2 % — ABNORMAL LOW (ref 36.0–46.0)
Hemoglobin: 6.9 g/dL — CL (ref 12.0–15.0)
Immature Granulocytes: 1 %
Lymphocytes Relative: 12 %
Lymphs Abs: 1.3 10*3/uL (ref 0.7–4.0)
MCH: 29.7 pg (ref 26.0–34.0)
MCHC: 31.1 g/dL (ref 30.0–36.0)
MCV: 95.7 fL (ref 80.0–100.0)
Monocytes Absolute: 0.9 10*3/uL (ref 0.1–1.0)
Monocytes Relative: 8 %
Neutro Abs: 7.7 10*3/uL (ref 1.7–7.7)
Neutrophils Relative %: 69 %
Platelets: 308 10*3/uL (ref 150–400)
RBC: 2.32 MIL/uL — ABNORMAL LOW (ref 3.87–5.11)
RDW: 14.3 % (ref 11.5–15.5)
WBC: 11.1 10*3/uL — ABNORMAL HIGH (ref 4.0–10.5)
nRBC: 0.2 % (ref 0.0–0.2)

## 2022-04-03 LAB — GLUCOSE, CAPILLARY
Glucose-Capillary: 106 mg/dL — ABNORMAL HIGH (ref 70–99)
Glucose-Capillary: 101 mg/dL — ABNORMAL HIGH (ref 70–99)
Glucose-Capillary: 99 mg/dL (ref 70–99)
Glucose-Capillary: 111 mg/dL — ABNORMAL HIGH (ref 70–99)

## 2022-04-03 LAB — CBC
HCT: 26.9 % — ABNORMAL LOW (ref 36.0–46.0)
Hemoglobin: 8.9 g/dL — ABNORMAL LOW (ref 12.0–15.0)
MCH: 29.6 pg (ref 26.0–34.0)
MCHC: 33.1 g/dL (ref 30.0–36.0)
MCV: 89.4 fL (ref 80.0–100.0)
Platelets: 321 10*3/uL (ref 150–400)
RBC: 3.01 MIL/uL — ABNORMAL LOW (ref 3.87–5.11)
RDW: 16.4 % — ABNORMAL HIGH (ref 11.5–15.5)
WBC: 10.2 10*3/uL (ref 4.0–10.5)
nRBC: 0 % (ref 0.0–0.2)

## 2022-04-03 LAB — BPAM RBC
Blood Product Expiration Date: 202306162359
Blood Product Expiration Date: 202306192359
Blood Product Expiration Date: 202306232359
Blood Product Expiration Date: 202306232359
ISSUE DATE / TIME: 202306061000
ISSUE DATE / TIME: 202306061000
Unit Type and Rh: 7300
Unit Type and Rh: 7300
Unit Type and Rh: 7300
Unit Type and Rh: 7300

## 2022-04-03 LAB — RETICULOCYTES
Immature Retic Fract: 28.3 % — ABNORMAL HIGH (ref 2.3–15.9)
RBC.: 2.95 MIL/uL — ABNORMAL LOW (ref 3.87–5.11)
Retic Count, Absolute: 66.4 10*3/uL (ref 19.0–186.0)
Retic Ct Pct: 2.3 % (ref 0.4–3.1)

## 2022-04-03 LAB — CULTURE, BLOOD (SINGLE): Culture: NO GROWTH

## 2022-04-03 LAB — PREPARE RBC (CROSSMATCH)

## 2022-04-03 MED ORDER — SODIUM CHLORIDE 0.9% IV SOLUTION: Freq: Once | INTRAVENOUS | Status: DC

## 2022-04-03 NOTE — Progress Notes (Addendum)
[facility]  Subjective  - tired machines keep beeping   Objective (!) 148/61 80 98.3 F (36.8 C) (Oral) 18 100%  Intake/Output Summary (Last 24 hours) at 04/03/2022 0915 Last data filed at 04/02/2022 2000 Gross per 24 hour  Intake 240 ml  Output 10 ml  Net 230 ml    Right groin vac seal lost, changed vac at bedside, reinforces seal x 2  Doppler PT/DP intact, neuro intact to touch Lungs non labored breathing  Assessment/Planning: POD # 4  81 y.o. female is s/p Incision and debridement of the right groin wound Reexposure of the common femoral artery Common femoral artery patch excision Common femoral patch plasty using ipsilateral greater saphenous vein Myriad Morcel placement VAC placement  Good inflow wit doppler signals intact Vac replaced due to seal loss.  Currently with good seal.  If vac fails again will do wet to dry then replace tomorrow. Cultures with few pseudomonas day 3 pending final growth Cont. Cassell Clement 04/03/2022 9:15 AM --  Laboratory Lab Results: Recent Labs    04/02/22 0822 04/03/22 0029  WBC 14.1* 11.1*  HGB 7.6* 6.9*  HCT 23.5* 22.2*  PLT 328 308   BMET Recent Labs    04/02/22 0822 04/03/22 0029  NA 138 139  K 3.8 4.2  CL 108 112*  CO2 20* 19*  GLUCOSE 120* 113*  BUN 26* 21  CREATININE 2.31* 2.06*  CALCIUM 8.6* 8.2*    COAG Lab Results  Component Value Date   INR 1.3 (H) 03/31/2022   INR 1.3 (H) 03/30/2022   INR 1.3 (H) 03/30/2022   No results found for: "PTT"    I have interviewed and examined patient with PA and agree with assessment and plan above.  Wound VAC changed today.  Tykesha Konicki C. Donzetta Matters, MD [facility] Office: [phone] Pager: [phone]

## 2022-04-03 NOTE — Progress Notes (Signed)
PROGRESS NOTE    Sabrina [name]  UPJ:[id number] DOB: [birth year] DOA: 03/29/2022 PCP: Crecencio Mc, MD   Brief Narrative: Sabrina [name] is a 81 y.o. female with a history of HTN, HLD, CAD (s/p 3v-CABG 2001), CAS/PVD (s/p L CEA 2004), abdominal aortic ectasia, mild AS, chronic diastolic CHF (Echo 06/2923 with EF > 55%, G1DD), pulmonary HTN, CKD stage III, PAD status post multiple revascularization procedures, cervical spondylosis. Patient presented from inpatient rehabilitation for malaise/drainage from wound secondary to post-op wound infection. Patient required antibiotics and return to the OR for I&D. Wound cultures significant for pseudomonas aeruginosa. Hospitalization complicated by AKI and acute anemia requiring blood transfusions.   Assessment and Plan:  Postoperative wound infection Vascular is following. Patient underwent I&D in OR on 6/5. Patient started empirically on Ceftriaxone and Flagyl. Antibiotics transitioned to Zosyn once culture was significant for pseudomonas species. Wound culture significant for pansensitive Pseudomonas aeruginosa.  -Continue Zosyn IV  Septic shock Secondary to wound infection. Patient required Neo-synephrine during surgery with subsequent ICU admission. Complicated by blood loss. Wound infection management as mentioned above.  Acute blood loss anemia Anemia of chronic disease Acute blood loss in setting of recent vascular surgery, but no obvious source of bleeding. Patient has received 3 units of PRBC to date. No GI bleeding noted.  -CBC/Reticulocyte count  Primary hypertension -Continue labetalol prn  AKI on CKD stage IIIb Baseline creatinine of about 1.5. Creatinine of 1.9 on admission with peak of 2.31. Improving. -BMP in AM  Hyponatremia Resolved.  PAD Recent cardiac arrest/NSTEMI Bilateral femoral endarterectomies -Continue aspirin, Plavix, Ranexa, Zetia  Anxiety Depression -Continue nortriptyline   DVT prophylaxis:  SCDs Code Status:   Code Status: Full Code Family Communication: None at bedside Disposition Plan: Discharge pending improvement of AKI, stability of hemoglobin. Plan for discharge back to inpatient rehab   Consultants:  Vascular surgery PCCM  Procedures:  Right groin I&D (6/6)  Antimicrobials: Ceftriaxone Flagyl Zosyn    Subjective: Patient is disheartened because of her persistent medical issues. She has not noticed any bleeding.  Objective: BP (!) 148/61 (BP Location: Left Arm)   Pulse 80   Temp 98.3 F (36.8 C) (Oral)   Resp 18   Ht 5\' 1"  (1.549 m)   Wt 71.7 kg   SpO2 100%   BMI 29.87 kg/m   Examination:  General exam: Appears calm and comfortable Respiratory system: Clear to auscultation. Respiratory effort normal. Cardiovascular system: S1 & S2 heard, RRR. No murmurs, rubs, gallops or clicks. Gastrointestinal system: Abdomen is nondistended, soft and nontender. Normal bowel sounds heard. Central nervous system: Alert and oriented.  Musculoskeletal: No calf tenderness Skin: No cyanosis. No rashes Psychiatry: Judgement and insight appear normal. Mood & affect appropriate.    Data Reviewed: I have personally reviewed following labs and imaging studies  CBC Lab Results  Component Value Date   WBC 11.1 (H) 04/03/2022   RBC 2.32 (L) 04/03/2022   HGB 6.9 (LL) 04/03/2022   HCT 22.2 (L) 04/03/2022   MCV 95.7 04/03/2022   MCH 29.7 04/03/2022   PLT 308 04/03/2022   MCHC 31.1 04/03/2022   RDW 14.3 04/03/2022   LYMPHSABS 1.3 04/03/2022   MONOABS 0.9 04/03/2022   EOSABS 1.1 (H) 04/03/2022   BASOSABS 0.0 46/28/6381     Last metabolic panel Lab Results  Component Value Date   NA 139 04/03/2022   K 4.2 04/03/2022   CL 112 (H) 04/03/2022   CO2 19 (L) 04/03/2022   BUN 21  04/03/2022   CREATININE 2.06 (H) 04/03/2022   GLUCOSE 113 (H) 04/03/2022   GFRNONAA 24 (L) 04/03/2022   GFRAA 50 (L) 03/10/2020   CALCIUM 8.2 (L) 04/03/2022   PHOS 3.6 03/31/2022    PROT 7.0 03/29/2022   ALBUMIN 3.0 (L) 03/29/2022   LABGLOB 3.3 03/27/2018   BILITOT 0.7 03/29/2022   ALKPHOS 75 03/29/2022   AST 18 03/29/2022   ALT 15 03/29/2022   ANIONGAP 8 04/03/2022    GFR: Estimated Creatinine Clearance: 19.7 mL/min (A) (by C-G formula based on SCr of 2.06 mg/dL (H)).  Recent Results (from the past 240 hour(s))  Culture, blood (single) w Reflex to ID Panel     Status: None   Collection Time: 03/29/22 12:34 PM   Specimen: BLOOD RIGHT HAND  Result Value Ref Range Status   Specimen Description BLOOD RIGHT HAND  Final   Special Requests   Final    BOTTLES DRAWN AEROBIC AND ANAEROBIC Blood Culture results may not be optimal due to an inadequate volume of blood received in culture bottles   Culture   Final    NO GROWTH 5 DAYS Performed at [facility], [place] [address] [place], [place] [address]    Report Status 04/03/2022 FINAL  Final  Surgical PCR screen     Status: Abnormal   Collection Time: 03/30/22  4:00 AM   Specimen: Nasal Mucosa; Nasal Swab  Result Value Ref Range Status   MRSA, PCR NEGATIVE NEGATIVE Final   Staphylococcus aureus POSITIVE (A) NEGATIVE Final    Comment: (NOTE) The Xpert SA Assay (FDA approved for NASAL specimens in patients 49 years of age and older), is one component of a comprehensive surveillance program. It is not intended to diagnose infection nor to guide or monitor treatment. Performed at [facility], [place] [address], [place], [place] [address]   Aerobic/Anaerobic Culture w Gram Stain (surgical/deep wound)     Status: None (Preliminary result)   Collection Time: 03/30/22  8:49 AM   Specimen: PATH Cytology Misc. fluid; Body Fluid  Result Value Ref Range Status   Specimen Description TISSUE  Final   Special Requests RIGHT GROIN  Final   Gram Stain   Final    FEW WBC PRESENT, PREDOMINANTLY PMN NO ORGANISMS SEEN Performed at [facility], [address], [place], [place] [address]    Culture   Final     RARE PSEUDOMONAS AERUGINOSA RESULT CALLED TO, READ BACK BY AND VERIFIED WITH: RN CATHY A [id number] MLM NO ANAEROBES ISOLATED; CULTURE IN PROGRESS FOR 5 DAYS    Report Status PENDING  Incomplete   Organism ID, Bacteria PSEUDOMONAS AERUGINOSA  Final      Susceptibility   Pseudomonas aeruginosa - MIC*    CEFTAZIDIME 4 SENSITIVE Sensitive     CIPROFLOXACIN 0.5 SENSITIVE Sensitive     GENTAMICIN <=1 SENSITIVE Sensitive     IMIPENEM 2 SENSITIVE Sensitive     PIP/TAZO 8 SENSITIVE Sensitive     CEFEPIME 2 SENSITIVE Sensitive     * RARE PSEUDOMONAS AERUGINOSA      Radiology Studies: US RENAL  Result Date: 04/02/2022 CLINICAL DATA:  Acute kidney injury EXAM: RENAL / URINARY TRACT ULTRASOUND COMPLETE COMPARISON:  None Available. FINDINGS: Right Kidney: Renal measurements: 10.3 x 3.6 x 3.1 cm = volume: 59 mL. Echogenicity within normal limits. No mass or hydronephrosis visualized. Left Kidney: Renal measurements: 9.7 x 4.0 x 4.3 cm = volume: 86 mL. Echogenicity within normal limits. No  mass or hydronephrosis visualized. Bladder: Decompressed urinary bladder. Other: None. IMPRESSION: 1. No hydronephrosis. 2. Decompressed urinary bladder. Electronically Signed   By: Delanna Ahmadi M.D.   On: 04/02/2022 15:59      LOS: 5 days    Cordelia Poche, MD [facility] 04/03/2022, 10:35 AM   If 7PM-7AM, please contact night-coverage [email or web address]

## 2022-04-04 ENCOUNTER — Inpatient Hospital Stay (HOSPITAL_COMMUNITY): Payer: Medicare Other

## 2022-04-04 LAB — GLUCOSE, CAPILLARY: Glucose-Capillary: 82 mg/dL (ref 70–99)

## 2022-04-04 LAB — TYPE AND SCREEN
ABO/RH(D): B POS
Antibody Screen: NEGATIVE
Unit division: 0

## 2022-04-04 LAB — BASIC METABOLIC PANEL
Anion gap: 8 (ref 5–15)
BUN: 15 mg/dL (ref 8–23)
CO2: 19 mmol/L — ABNORMAL LOW (ref 22–32)
Calcium: 8.7 mg/dL — ABNORMAL LOW (ref 8.9–10.3)
Chloride: 113 mmol/L — ABNORMAL HIGH (ref 98–111)
Creatinine, Ser: 1.5 mg/dL — ABNORMAL HIGH (ref 0.44–1.00)
GFR, Estimated: 35 mL/min — ABNORMAL LOW (ref >60)
Glucose, Bld: 93 mg/dL (ref 70–99)
Potassium: 4.3 mmol/L (ref 3.5–5.1)
Sodium: 140 mmol/L (ref 135–145)

## 2022-04-04 LAB — AEROBIC/ANAEROBIC CULTURE W GRAM STAIN (SURGICAL/DEEP WOUND)

## 2022-04-04 LAB — CBC WITH DIFFERENTIAL/PLATELET
Abs Immature Granulocytes: 0.15 10*3/uL — ABNORMAL HIGH (ref 0.00–0.07)
Basophils Absolute: 0.1 10*3/uL (ref 0.0–0.1)
Basophils Relative: 1 %
Eosinophils Absolute: 0.9 10*3/uL — ABNORMAL HIGH (ref 0.0–0.5)
Eosinophils Relative: 8 %
HCT: 26.3 % — ABNORMAL LOW (ref 36.0–46.0)
Hemoglobin: 8.6 g/dL — ABNORMAL LOW (ref 12.0–15.0)
Immature Granulocytes: 1 %
Lymphocytes Relative: 17 %
Lymphs Abs: 1.9 10*3/uL (ref 0.7–4.0)
MCH: 28.8 pg (ref 26.0–34.0)
MCHC: 32.7 g/dL (ref 30.0–36.0)
MCV: 88 fL (ref 80.0–100.0)
Monocytes Absolute: 1 10*3/uL (ref 0.1–1.0)
Monocytes Relative: 9 %
Neutro Abs: 7 10*3/uL (ref 1.7–7.7)
Neutrophils Relative %: 64 %
Platelets: UNDETERMINED 10*3/uL (ref 150–400)
RBC: 2.99 MIL/uL — ABNORMAL LOW (ref 3.87–5.11)
RDW: 15.9 % — ABNORMAL HIGH (ref 11.5–15.5)
WBC: 10.9 10*3/uL — ABNORMAL HIGH (ref 4.0–10.5)
nRBC: 0 % (ref 0.0–0.2)

## 2022-04-04 LAB — BPAM RBC
Blood Product Expiration Date: 202306232359
ISSUE DATE / TIME: 202306100456
Unit Type and Rh: 7300

## 2022-04-04 MED ORDER — PIPERACILLIN-TAZOBACTAM 3.375 G IVPB
3.3750 g | Freq: Three times a day (TID) | INTRAVENOUS | Status: DC
  Administered 2022-04-04 – 2022-04-05 (×2): 3.375 g via INTRAVENOUS
  Filled 2022-04-04: qty 50

## 2022-04-04 MED ORDER — LABETALOL HCL 200 MG PO TABS
300.0000 mg | ORAL_TABLET | Freq: Two times a day (BID) | ORAL | Status: DC
  Administered 2022-04-04 – 2022-04-05 (×2): 300 mg via ORAL
  Filled 2022-04-04 (×2): qty 2

## 2022-04-04 NOTE — Progress Notes (Addendum)
[facility]  Subjective  - Feels like her right LE is heavy   Objective (!) 194/67 80 98.1 F (36.7 C) (Oral) 16 100%  Intake/Output Summary (Last 24 hours) at 04/04/2022 0930 Last data filed at 04/04/2022 0800 Gross per 24 hour  Intake 4329.24 ml  Output 245 ml  Net 4084.24 ml    Right groin vac with good seal today. OP 115 cc total Doppler PT/DP intact, neuro intact to touch Abdomin soft Lungs non labored breathing   Assessment/Planning: POD # 5 81 y.o. female is s/p Incision and debridement of the right groin wound Reexposure of the common femoral artery Common femoral artery patch excision Common femoral patch plasty using ipsilateral greater saphenous vein Myriad Morcel placement VAC placement  Good inflow wit doppler signals intact Vac seal intact change 04/03/22 for seal loss Cultures with few pseudomonas pending final growth Cont. Zosyn  Roxy Horseman 04/04/2022 9:30 AM --  Laboratory Lab Results: Recent Labs    04/03/22 1054 04/04/22 0241  WBC 10.2 10.9*  HGB 8.9* 8.6*  HCT 26.9* 26.3*  PLT 321 PLATELET CLUMPS NOTED ON SMEAR, UNABLE TO ESTIMATE   BMET Recent Labs    04/03/22 0029 04/04/22 0241  NA 139 140  K 4.2 4.3  CL 112* 113*  CO2 19* 19*  GLUCOSE 113* 93  BUN 21 15  CREATININE 2.06* 1.50*  CALCIUM 8.2* 8.7*    COAG Lab Results  Component Value Date   INR 1.3 (H) 03/31/2022   INR 1.3 (H) 03/30/2022   INR 1.3 (H) 03/30/2022   No results found for: "PTT"   I have interviewed and examined patient with PA and agree with assessment and plan above.  Wound VAC now maintaining suction, continue antibiotics.  Genifer Lazenby C. Donzetta Matters, MD [facility] Office: [phone] Pager: [phone]

## 2022-04-04 NOTE — Progress Notes (Signed)
Pt resting comfortably on RA with no WOB noted. Will hold off on BiPAP tonight.

## 2022-04-04 NOTE — Progress Notes (Signed)
Mobility Specialist Progress Note    04/04/22 1352  Mobility  Activity Ambulated with assistance in hallway  Level of Assistance Minimal assist, patient does 75% or more  Assistive Device Front wheel walker  Distance Ambulated (ft) 170 ft (10+160)  Activity Response Tolerated well  $Mobility charge 1 Mobility   Post-Mobility: 173/56 BP  Pt received in bed and agreeable. Had void in BR. No complaints on walk. Returned to bed with call bell in reach.    Hildred Alamin Mobility Specialist

## 2022-04-04 NOTE — Progress Notes (Signed)
Pharmacy Antibiotic Note  Sabrina [name] is a 81 y.o. female admitted on 03/29/2022 with  R groin infection s/p I/D .  Pharmacy has been consulted for zosyn dosing.  Allergy to PCN-itching, tolerated zosyn intra-op  5/30 wound culture + kleb pneumo, proteus mirabilis (sensitive to all, but ampicillin) 6/6 Rt groin tissue/fluid - pseudomonas- S to zosyn  Patient currently afebrile, wbc stable at 10.9, scr is 1.5, CrCl > 20, however this is the first time CrCl > 20 this admit (stable ~2).   Plan: Change to Extended interveral zosyn infusion Monitor renal function closely for need to change back to regular dosing Vascular is recommending 6 weeks of antibiotics   Height: 5\' 1"  (154.9 cm) Weight: 74.6 kg (164 lb 7.4 oz) IBW/kg (Calculated) : 47.8  Temp (24hrs), Avg:98.1 F (36.7 C), Min:97.9 F (36.6 C), Max:98.2 F (36.8 C)  Recent Labs  Lab 03/30/22 1527 03/31/22 0013 03/31/22 0630 04/01/22 0017 04/02/22 0822 04/03/22 0029 04/03/22 1054 04/04/22 0241  WBC 15.1*   < > 13.3* 14.3* 14.1* 11.1* 10.2 10.9*  CREATININE 1.61*  --  1.76* 2.11* 2.31* 2.06*  --  1.50*  LATICACIDVEN 1.0  --   --   --   --   --   --   --    < > = values in this interval not displayed.     Estimated Creatinine Clearance: 27.6 mL/min (A) (by C-G formula based on SCr of 1.5 mg/dL (H)).    Allergies  Allergen Reactions   Citalopram Anaphylaxis    Throat closing    Dilaudid [Hydromorphone Hcl] Nausea And Vomiting   Hydrochlorothiazide Other (See Comments)    Decreased GFR (Nov 2015)   Liothyronine     Hair fell out, caused headaches    Nsaids     CKD stage III - avoid nephrotoxic drugs   Nubain [Nalbuphine Hcl]     Burning sensation in back   Penicillins Itching   Prasugrel Itching   Statins Itching    Thank you for allowing pharmacy to be a part of this patient's care.  Cathrine Muster, PharmD PGY2 Cardiology Pharmacy Resident 04/04/2022  12:21 PM  Please check [email or web address] for  unit-specific pharmacy phone numbers.

## 2022-04-04 NOTE — Progress Notes (Signed)
PROGRESS NOTE    Sabrina [name]  OEV:[id number] DOB: 1941-02-22 DOA: 03/29/2022 PCP: Crecencio Mc, MD   Brief Narrative: [name] Duguay is a 81 y.o. female with a history of HTN, HLD, CAD (s/p 3v-CABG 2001), CAS/PVD (s/p L CEA 2004), abdominal aortic ectasia, mild AS, chronic diastolic CHF (Echo 05/2992 with EF > 55%, G1DD), pulmonary HTN, CKD stage III, PAD status post multiple revascularization procedures, cervical spondylosis. Patient presented from inpatient rehabilitation for malaise/drainage from wound secondary to post-op wound infection. Patient required antibiotics and return to the OR for I&D. Wound cultures significant for pseudomonas aeruginosa. Hospitalization complicated by AKI and acute anemia requiring blood transfusions.   Assessment and Plan:  Postoperative wound infection Vascular is following. Patient underwent I&D in OR on 6/5. Patient started empirically on Ceftriaxone and Flagyl. Antibiotics transitioned to Zosyn once culture was significant for pseudomonas species. Wound culture significant for pansensitive Pseudomonas aeruginosa.  -Continue Zosyn IV  Septic shock Secondary to wound infection. Patient required Neo-synephrine during surgery with subsequent ICU admission. Complicated by blood loss. Wound infection management as mentioned above.  Acute blood loss anemia Anemia of chronic disease Acute blood loss in setting of recent vascular surgery, but no obvious source of bleeding. Patient has received 3 units of PRBC to date. No GI bleeding noted. Discussed with Vascular surgery and no concern for post-operative bleeding/hematoma at this time. -CBC in AM  Primary hypertension Uncontrolled today. -Resume home labetalol 300 mg BID  AKI on CKD stage IIIb Baseline creatinine of about 1.5. Creatinine of 1.9 on admission with peak of 2.31. Improved back towards baseline. -Discontinue IV fluids.  Hyponatremia Resolved.  PAD Recent cardiac  arrest/NSTEMI Bilateral femoral endarterectomies -Continue aspirin, Plavix, Ranexa, Zetia  Anxiety Depression -Continue nortriptyline   DVT prophylaxis: SCDs Code Status:   Code Status: Full Code Family Communication: None at bedside Disposition Plan: Discharge pending stability of hemoglobin. Plan for discharge back to inpatient rehab   Consultants:  Vascular surgery PCCM  Procedures:  Right groin I&D (6/6)  Antimicrobials: Ceftriaxone Flagyl Zosyn    Subjective: Patient is disheartened because of her persistent medical issues. She has not noticed any bleeding.  Objective: BP (!) 194/67 (BP Location: Left Arm)   Pulse 80   Temp 98.1 F (36.7 C) (Oral)   Resp 16   Ht 5\' 1"  (1.549 m)   Wt 74.6 kg   SpO2 100%   BMI 31.08 kg/m   Examination:  General exam: Appears calm and comfortable Respiratory system: Clear to auscultation. Respiratory effort normal. Cardiovascular system: S1 & S2 heard, RRR. No murmurs, rubs, gallops or clicks. Gastrointestinal system: Abdomen is distended, soft and non-tender. Normal bowel sounds heard. Central nervous system: Alert and oriented. No focal neurological deficits. Musculoskeletal: Right thigh swelling. No calf tenderness Skin: No cyanosis. Psychiatry: Judgement and insight appear normal. Mood & affect appropriate.    Data Reviewed: I have personally reviewed following labs and imaging studies  CBC Lab Results  Component Value Date   WBC 10.9 (H) 04/04/2022   RBC 2.99 (L) 04/04/2022   HGB 8.6 (L) 04/04/2022   HCT 26.3 (L) 04/04/2022   MCV 88.0 04/04/2022   MCH 28.8 04/04/2022   PLT PLATELET CLUMPS NOTED ON SMEAR, UNABLE TO ESTIMATE 04/04/2022   MCHC 32.7 04/04/2022   RDW 15.9 (H) 04/04/2022   LYMPHSABS 1.9 04/04/2022   MONOABS 1.0 04/04/2022   EOSABS 0.9 (H) 04/04/2022   BASOSABS 0.1 71/69/6789     Last metabolic panel Lab Results  Component Value Date   NA 140 04/04/2022   K 4.3 04/04/2022   CL 113 (H)  04/04/2022   CO2 19 (L) 04/04/2022   BUN 15 04/04/2022   CREATININE 1.50 (H) 04/04/2022   GLUCOSE 93 04/04/2022   GFRNONAA 35 (L) 04/04/2022   GFRAA 50 (L) 03/10/2020   CALCIUM 8.7 (L) 04/04/2022   PHOS 3.6 03/31/2022   PROT 7.0 03/29/2022   ALBUMIN 3.0 (L) 03/29/2022   LABGLOB 3.3 03/27/2018   BILITOT 0.7 03/29/2022   ALKPHOS 75 03/29/2022   AST 18 03/29/2022   ALT 15 03/29/2022   ANIONGAP 8 04/04/2022    GFR: Estimated Creatinine Clearance: 27.6 mL/min (A) (by C-G formula based on SCr of 1.5 mg/dL (H)).  Recent Results (from the past 240 hour(s))  Culture, blood (single) w Reflex to ID Panel     Status: None   Collection Time: 03/29/22 12:34 PM   Specimen: BLOOD RIGHT HAND  Result Value Ref Range Status   Specimen Description BLOOD RIGHT HAND  Final   Special Requests   Final    BOTTLES DRAWN AEROBIC AND ANAEROBIC Blood Culture results may not be optimal due to an inadequate volume of blood received in culture bottles   Culture   Final    NO GROWTH 5 DAYS Performed at [facility], [place] [address] [place], [place] [address]    Report Status 04/03/2022 FINAL  Final  Surgical PCR screen     Status: Abnormal   Collection Time: 03/30/22  4:00 AM   Specimen: Nasal Mucosa; Nasal Swab  Result Value Ref Range Status   MRSA, PCR NEGATIVE NEGATIVE Final   Staphylococcus aureus POSITIVE (A) NEGATIVE Final    Comment: (NOTE) The Xpert SA Assay (FDA approved for NASAL specimens in patients 48 years of age and older), is one component of a comprehensive surveillance program. It is not intended to diagnose infection nor to guide or monitor treatment. Performed at [facility], [place] [address] [place], [place] [address]   Aerobic/Anaerobic Culture w Gram Stain (surgical/deep wound)     Status: None (Preliminary result)   Collection Time: 03/30/22  8:49 AM   Specimen: PATH Cytology Misc. fluid; Body Fluid  Result Value Ref Range Status   Specimen Description TISSUE   Final   Special Requests RIGHT GROIN  Final   Gram Stain   Final    FEW WBC PRESENT, PREDOMINANTLY PMN NO ORGANISMS SEEN Performed at [facility], [address] [place], [place] [address]    Culture   Final    RARE PSEUDOMONAS AERUGINOSA RESULT CALLED TO, READ BACK BY AND VERIFIED WITH: RN CATHY A [phone] MLM NO ANAEROBES ISOLATED; CULTURE IN PROGRESS FOR 5 DAYS    Report Status PENDING  Incomplete   Organism ID, Bacteria PSEUDOMONAS AERUGINOSA  Final      Susceptibility   Pseudomonas aeruginosa - MIC*    CEFTAZIDIME 4 SENSITIVE Sensitive     CIPROFLOXACIN 0.5 SENSITIVE Sensitive     GENTAMICIN <=1 SENSITIVE Sensitive     IMIPENEM 2 SENSITIVE Sensitive     PIP/TAZO 8 SENSITIVE Sensitive     CEFEPIME 2 SENSITIVE Sensitive     * RARE PSEUDOMONAS AERUGINOSA      Radiology Studies: US RENAL  Result Date: 04/02/2022 CLINICAL DATA:  Acute kidney injury EXAM: RENAL / URINARY TRACT ULTRASOUND COMPLETE COMPARISON:  None Available. FINDINGS: Right Kidney: Renal measurements: 10.3 x 3.6 x 3.1 cm = volume: 59 mL.  Echogenicity within normal limits. No mass or hydronephrosis visualized. Left Kidney: Renal measurements: 9.7 x 4.0 x 4.3 cm = volume: 86 mL. Echogenicity within normal limits. No mass or hydronephrosis visualized. Bladder: Decompressed urinary bladder. Other: None. IMPRESSION: 1. No hydronephrosis. 2. Decompressed urinary bladder. Electronically Signed   By: Delanna Ahmadi M.D.   On: 04/02/2022 15:59      LOS: 6 days    Cordelia Poche, MD [facility] 04/04/2022, 11:56 AM   If 7PM-7AM, please contact night-coverage [email or web address]

## 2022-04-05 ENCOUNTER — Inpatient Hospital Stay: Payer: Self-pay

## 2022-04-05 ENCOUNTER — Encounter (HOSPITAL_COMMUNITY): Payer: Self-pay | Admitting: Physical Medicine and Rehabilitation

## 2022-04-05 ENCOUNTER — Inpatient Hospital Stay (HOSPITAL_COMMUNITY)
Admission: RE | Admit: 2022-04-05 | Discharge: 2022-04-16 | DRG: 945 | Disposition: A | Discharge status: Home Health | Payer: Medicare Other | Source: Intra-hospital | Attending: Physical Medicine and Rehabilitation | Admitting: Physical Medicine and Rehabilitation

## 2022-04-05 ENCOUNTER — Other Ambulatory Visit: Payer: Self-pay

## 2022-04-05 DIAGNOSIS — R5381 Other malaise: Principal | ICD-10-CM | POA: Diagnosis present

## 2022-04-05 DIAGNOSIS — B965 Pseudomonas (aeruginosa) (mallei) (pseudomallei) as the cause of diseases classified elsewhere: Secondary | ICD-10-CM | POA: Diagnosis present

## 2022-04-05 DIAGNOSIS — D631 Anemia in chronic kidney disease: Secondary | ICD-10-CM | POA: Diagnosis not present

## 2022-04-05 DIAGNOSIS — N183 Chronic kidney disease, stage 3 unspecified: Secondary | ICD-10-CM

## 2022-04-05 DIAGNOSIS — K219 Gastro-esophageal reflux disease without esophagitis: Secondary | ICD-10-CM | POA: Diagnosis present

## 2022-04-05 DIAGNOSIS — Z96643 Presence of artificial hip joint, bilateral: Secondary | ICD-10-CM | POA: Diagnosis present

## 2022-04-05 DIAGNOSIS — Z951 Presence of aortocoronary bypass graft: Secondary | ICD-10-CM | POA: Diagnosis not present

## 2022-04-05 DIAGNOSIS — F32A Depression, unspecified: Secondary | ICD-10-CM | POA: Diagnosis not present

## 2022-04-05 DIAGNOSIS — Z6830 Body mass index (BMI) 30.0-30.9, adult: Secondary | ICD-10-CM

## 2022-04-05 DIAGNOSIS — E669 Obesity, unspecified: Secondary | ICD-10-CM | POA: Diagnosis not present

## 2022-04-05 DIAGNOSIS — Z79899 Other long term (current) drug therapy: Secondary | ICD-10-CM

## 2022-04-05 DIAGNOSIS — E785 Hyperlipidemia, unspecified: Secondary | ICD-10-CM | POA: Diagnosis not present

## 2022-04-05 DIAGNOSIS — F419 Anxiety disorder, unspecified: Secondary | ICD-10-CM | POA: Diagnosis present

## 2022-04-05 DIAGNOSIS — I2510 Atherosclerotic heart disease of native coronary artery without angina pectoris: Secondary | ICD-10-CM | POA: Diagnosis not present

## 2022-04-05 DIAGNOSIS — E038 Other specified hypothyroidism: Secondary | ICD-10-CM | POA: Diagnosis present

## 2022-04-05 DIAGNOSIS — Z87891 Personal history of nicotine dependence: Secondary | ICD-10-CM

## 2022-04-05 DIAGNOSIS — B999 Unspecified infectious disease: Secondary | ICD-10-CM | POA: Diagnosis not present

## 2022-04-05 DIAGNOSIS — Z820 Family history of epilepsy and other diseases of the nervous system: Secondary | ICD-10-CM

## 2022-04-05 DIAGNOSIS — G894 Chronic pain syndrome: Secondary | ICD-10-CM | POA: Diagnosis present

## 2022-04-05 DIAGNOSIS — Z955 Presence of coronary angioplasty implant and graft: Secondary | ICD-10-CM

## 2022-04-05 DIAGNOSIS — Z823 Family history of stroke: Secondary | ICD-10-CM

## 2022-04-05 DIAGNOSIS — I214 Non-ST elevation (NSTEMI) myocardial infarction: Secondary | ICD-10-CM | POA: Diagnosis not present

## 2022-04-05 DIAGNOSIS — I10 Essential (primary) hypertension: Secondary | ICD-10-CM

## 2022-04-05 DIAGNOSIS — Z8674 Personal history of sudden cardiac arrest: Secondary | ICD-10-CM | POA: Diagnosis not present

## 2022-04-05 DIAGNOSIS — Z888 Allergy status to other drugs, medicaments and biological substances status: Secondary | ICD-10-CM

## 2022-04-05 DIAGNOSIS — R52 Pain, unspecified: Secondary | ICD-10-CM | POA: Diagnosis not present

## 2022-04-05 DIAGNOSIS — N1831 Chronic kidney disease, stage 3a: Secondary | ICD-10-CM | POA: Diagnosis not present

## 2022-04-05 DIAGNOSIS — Z89421 Acquired absence of other right toe(s): Secondary | ICD-10-CM | POA: Diagnosis not present

## 2022-04-05 DIAGNOSIS — K118 Other diseases of salivary glands: Secondary | ICD-10-CM

## 2022-04-05 DIAGNOSIS — Z95820 Peripheral vascular angioplasty status with implants and grafts: Secondary | ICD-10-CM

## 2022-04-05 DIAGNOSIS — Z803 Family history of malignant neoplasm of breast: Secondary | ICD-10-CM

## 2022-04-05 DIAGNOSIS — E876 Hypokalemia: Secondary | ICD-10-CM | POA: Diagnosis not present

## 2022-04-05 DIAGNOSIS — I5032 Chronic diastolic (congestive) heart failure: Secondary | ICD-10-CM | POA: Diagnosis not present

## 2022-04-05 DIAGNOSIS — D649 Anemia, unspecified: Secondary | ICD-10-CM | POA: Diagnosis not present

## 2022-04-05 DIAGNOSIS — Z880 Allergy status to penicillin: Secondary | ICD-10-CM

## 2022-04-05 DIAGNOSIS — I272 Pulmonary hypertension, unspecified: Secondary | ICD-10-CM | POA: Diagnosis not present

## 2022-04-05 DIAGNOSIS — I739 Peripheral vascular disease, unspecified: Secondary | ICD-10-CM | POA: Diagnosis present

## 2022-04-05 DIAGNOSIS — Z8616 Personal history of COVID-19: Secondary | ICD-10-CM | POA: Diagnosis not present

## 2022-04-05 DIAGNOSIS — R197 Diarrhea, unspecified: Secondary | ICD-10-CM | POA: Diagnosis not present

## 2022-04-05 DIAGNOSIS — Z8249 Family history of ischemic heart disease and other diseases of the circulatory system: Secondary | ICD-10-CM

## 2022-04-05 DIAGNOSIS — I25810 Atherosclerosis of coronary artery bypass graft(s) without angina pectoris: Secondary | ICD-10-CM | POA: Diagnosis not present

## 2022-04-05 DIAGNOSIS — I130 Hypertensive heart and chronic kidney disease with heart failure and stage 1 through stage 4 chronic kidney disease, or unspecified chronic kidney disease: Secondary | ICD-10-CM | POA: Diagnosis not present

## 2022-04-05 DIAGNOSIS — Z7982 Long term (current) use of aspirin: Secondary | ICD-10-CM

## 2022-04-05 DIAGNOSIS — F411 Generalized anxiety disorder: Secondary | ICD-10-CM | POA: Diagnosis present

## 2022-04-05 DIAGNOSIS — D62 Acute posthemorrhagic anemia: Secondary | ICD-10-CM | POA: Diagnosis present

## 2022-04-05 DIAGNOSIS — Z90710 Acquired absence of both cervix and uterus: Secondary | ICD-10-CM | POA: Diagnosis not present

## 2022-04-05 DIAGNOSIS — Z885 Allergy status to narcotic agent status: Secondary | ICD-10-CM

## 2022-04-05 DIAGNOSIS — Z833 Family history of diabetes mellitus: Secondary | ICD-10-CM

## 2022-04-05 LAB — BASIC METABOLIC PANEL
Anion gap: 9 (ref 5–15)
BUN: 10 mg/dL (ref 8–23)
CO2: 18 mmol/L — ABNORMAL LOW (ref 22–32)
Calcium: 8.6 mg/dL — ABNORMAL LOW (ref 8.9–10.3)
Chloride: 113 mmol/L — ABNORMAL HIGH (ref 98–111)
Creatinine, Ser: 1.3 mg/dL — ABNORMAL HIGH (ref 0.44–1.00)
GFR, Estimated: 42 mL/min — ABNORMAL LOW (ref >60)
Glucose, Bld: 98 mg/dL (ref 70–99)
Potassium: 3.9 mmol/L (ref 3.5–5.1)
Sodium: 140 mmol/L (ref 135–145)

## 2022-04-05 LAB — CBC WITH DIFFERENTIAL/PLATELET
Abs Immature Granulocytes: 0.15 10*3/uL — ABNORMAL HIGH (ref 0.00–0.07)
Basophils Absolute: 0 10*3/uL (ref 0.0–0.1)
Basophils Relative: 0 %
Eosinophils Absolute: 0.7 10*3/uL — ABNORMAL HIGH (ref 0.0–0.5)
Eosinophils Relative: 8 %
HCT: 25.8 % — ABNORMAL LOW (ref 36.0–46.0)
Hemoglobin: 8.6 g/dL — ABNORMAL LOW (ref 12.0–15.0)
Immature Granulocytes: 2 %
Lymphocytes Relative: 14 %
Lymphs Abs: 1.3 10*3/uL (ref 0.7–4.0)
MCH: 29.2 pg (ref 26.0–34.0)
MCHC: 33.3 g/dL (ref 30.0–36.0)
MCV: 87.5 fL (ref 80.0–100.0)
Monocytes Absolute: 0.8 10*3/uL (ref 0.1–1.0)
Monocytes Relative: 8 %
Neutro Abs: 6.9 10*3/uL (ref 1.7–7.7)
Neutrophils Relative %: 68 %
Platelets: 342 10*3/uL (ref 150–400)
RBC: 2.95 MIL/uL — ABNORMAL LOW (ref 3.87–5.11)
RDW: 15.3 % (ref 11.5–15.5)
WBC: 9.9 10*3/uL (ref 4.0–10.5)
nRBC: 0 % (ref 0.0–0.2)

## 2022-04-05 MED ORDER — EZETIMIBE 10 MG PO TABS
10.0000 mg | ORAL_TABLET | Freq: Every day | ORAL | Status: DC
  Administered 2022-04-06 – 2022-04-16 (×11): 10 mg via ORAL
  Filled 2022-04-05 (×11): qty 1

## 2022-04-05 MED ORDER — OXYCODONE-ACETAMINOPHEN 5-325 MG PO TABS
1.0000 | ORAL_TABLET | ORAL | Status: DC | PRN
  Administered 2022-04-05: 2 via ORAL
  Administered 2022-04-06: 1 via ORAL
  Administered 2022-04-06 – 2022-04-09 (×5): 2 via ORAL
  Administered 2022-04-10: 1 via ORAL
  Administered 2022-04-10 – 2022-04-16 (×9): 2 via ORAL
  Filled 2022-04-05 (×9): qty 2
  Filled 2022-04-05: qty 1
  Filled 2022-04-05 (×5): qty 2
  Filled 2022-04-05: qty 1
  Filled 2022-04-05 (×5): qty 2

## 2022-04-05 MED ORDER — AMLODIPINE BESYLATE 10 MG PO TABS
10.0000 mg | ORAL_TABLET | Freq: Every day | ORAL | Status: DC
Start: 2022-04-05 — End: 2022-04-05
  Administered 2022-04-05: 10 mg via ORAL
  Filled 2022-04-05: qty 1

## 2022-04-05 MED ORDER — HYDRALAZINE HCL 20 MG/ML IJ SOLN: 10.0000 mg | Freq: Four times a day (QID) | INTRAMUSCULAR | Status: DC | PRN

## 2022-04-05 MED ORDER — DOCUSATE SODIUM 100 MG PO CAPS: 100.0000 mg | ORAL_CAPSULE | Freq: Two times a day (BID) | ORAL | Status: DC | PRN

## 2022-04-05 MED ORDER — LABETALOL HCL 300 MG PO TABS
300.0000 mg | ORAL_TABLET | Freq: Two times a day (BID) | ORAL | Status: DC
  Administered 2022-04-05 – 2022-04-16 (×22): 300 mg via ORAL
  Filled 2022-04-05 (×22): qty 1

## 2022-04-05 MED ORDER — ASPIRIN 81 MG PO CHEW
81.0000 mg | CHEWABLE_TABLET | Freq: Every day | ORAL | Status: DC
  Administered 2022-04-06 – 2022-04-16 (×11): 81 mg via ORAL
  Filled 2022-04-05 (×11): qty 1

## 2022-04-05 MED ORDER — PIPERACILLIN-TAZOBACTAM 3.375 G IVPB: 3.3750 g | Freq: Three times a day (TID) | INTRAVENOUS | Status: DC

## 2022-04-05 MED ORDER — AMLODIPINE BESYLATE 10 MG PO TABS
10.0000 mg | ORAL_TABLET | Freq: Every day | ORAL | Status: DC
  Administered 2022-04-06 – 2022-04-16 (×9): 10 mg via ORAL
  Filled 2022-04-05 (×10): qty 1

## 2022-04-05 MED ORDER — FERROUS SULFATE 325 (65 FE) MG PO TABS
325.0000 mg | ORAL_TABLET | Freq: Every day | ORAL | Status: DC
  Administered 2022-04-06 – 2022-04-16 (×11): 325 mg via ORAL
  Filled 2022-04-05 (×11): qty 1

## 2022-04-05 MED ORDER — ACETAMINOPHEN 325 MG PO TABS
325.0000 mg | ORAL_TABLET | ORAL | Status: DC | PRN
  Administered 2022-04-10: 650 mg via ORAL
  Filled 2022-04-05 (×2): qty 2

## 2022-04-05 MED ORDER — POLYETHYLENE GLYCOL 3350 17 G PO PACK
17.0000 g | PACK | Freq: Every day | ORAL | Status: DC | PRN
  Administered 2022-04-09: 17 g via ORAL
  Filled 2022-04-05: qty 1

## 2022-04-05 MED ORDER — NORTRIPTYLINE HCL 10 MG PO CAPS
10.0000 mg | ORAL_CAPSULE | Freq: Two times a day (BID) | ORAL | Status: DC
  Administered 2022-04-05 – 2022-04-16 (×22): 10 mg via ORAL
  Filled 2022-04-05 (×22): qty 1

## 2022-04-05 MED ORDER — ACETAMINOPHEN 650 MG RE SUPP: 325.0000 mg | RECTAL | Status: DC | PRN

## 2022-04-05 MED ORDER — CLOPIDOGREL BISULFATE 75 MG PO TABS
75.0000 mg | ORAL_TABLET | Freq: Every day | ORAL | Status: DC
Start: 2022-04-06 — End: 2022-04-16
  Administered 2022-04-06 – 2022-04-16 (×11): 75 mg via ORAL
  Filled 2022-04-05 (×11): qty 1

## 2022-04-05 MED ORDER — PIPERACILLIN-TAZOBACTAM 3.375 G IVPB
3.3750 g | Freq: Three times a day (TID) | INTRAVENOUS | Status: DC
  Administered 2022-04-05 – 2022-04-12 (×20): 3.375 g via INTRAVENOUS
  Filled 2022-04-05 (×22): qty 50

## 2022-04-05 MED ORDER — RANOLAZINE ER 500 MG PO TB12
500.0000 mg | ORAL_TABLET | Freq: Two times a day (BID) | ORAL | Status: DC
  Administered 2022-04-05 – 2022-04-16 (×22): 500 mg via ORAL
  Filled 2022-04-05 (×22): qty 1

## 2022-04-05 NOTE — Discharge Summary (Signed)
Physician Discharge Summary   Patient: Sabrina [name] MRN: [id number] DOB: 1941/01/25  Admit date:     03/29/2022  Discharge date: 04/05/22  Discharge Physician: Cordelia Poche, MD   PCP: Crecencio Mc, MD   Recommendations at discharge:  Vascular surgery follow-up Antibiotics until 6/21 at least; longer pending vascular surgery recommendations  Discharge Diagnoses: Principal Problem:   Postoperative wound infection Active Problems:   Acute kidney injury superimposed on chronic kidney disease ([place])   HLD (hyperlipidemia)   Peripheral vascular disease ([place])   Generalized anxiety disorder   Acute blood loss anemia   Myocardial infarction due to demand ischemia [facility])  Resolved Problems:   * No resolved hospital problems. [facility] Course: Sabrina [name] is a 81 y.o. female with a history of HTN, HLD, CAD (s/p 3v-CABG 2001), CAS/PVD (s/p L CEA 2004), abdominal aortic ectasia, mild AS, chronic diastolic CHF (Echo 04/8294 with EF > 55%, G1DD), pulmonary HTN, CKD stage III, PAD status post multiple revascularization procedures, cervical spondylosis. Patient presented from inpatient rehabilitation for malaise/drainage from wound secondary to post-op wound infection. Patient required antibiotics and return to the OR for I&D. Wound cultures significant for pseudomonas aeruginosa. Hospitalization complicated by AKI and acute anemia requiring blood transfusions.  Assessment and Plan: Postoperative wound infection Vascular is following. Patient underwent I&D in OR on 6/5. Patient started empirically on Ceftriaxone and Flagyl. Antibiotics transitioned to Zosyn on 6/6 once culture was significant for pseudomonas species. Wound culture significant for pansensitive Pseudomonas aeruginosa. Discharged to inpatient rehabilitation, so recommend to continue Zosyn until 6/21. If ready to discharge prior to that date, can transition to Ciprofloxacin (renally dosed) for outpatient treatment. May need  to finalize duration with vascular surgery prior to discharge.   Septic shock Secondary to wound infection. Patient required Neo-synephrine during surgery with subsequent ICU admission. Complicated by blood loss. Wound infection management as mentioned above. Resolved.   Acute blood loss anemia Anemia of chronic disease Acute blood loss in setting of recent vascular surgery, but no obvious source of bleeding. Patient has received 3 units of PRBC to date. No GI bleeding noted. Discussed with Vascular surgery and no concern for post-operative bleeding/hematoma at this time. Hemoglobin stable.   Primary hypertension Hypertensive urgency Home labetalol 300 mg BID restarted/continued. Patient also started/restarted on previously prescribed amlodipine 10 mg daily. Continue labetalol and amlodipine on discharge.   AKI on CKD stage IIIb Baseline creatinine of about 1.5. Creatinine of 1.9 on admission with peak of 2.31. Improved back towards baseline. AKI resolved.   Hyponatremia Resolved.   PAD Recent cardiac arrest/NSTEMI Bilateral femoral endarterectomies Continue aspirin, Plavix, Ranexa, Zetia.   Anxiety Depression Continue nortriptyline.   Consultants: Vascular surgery Procedures performed: Right groin I&D (6/6)  Disposition: Rehabilitation facility Diet recommendation: Cardiac diet   DISCHARGE MEDICATION: Allergies as of 04/05/2022       Reactions   Citalopram Anaphylaxis   Throat closing    Dilaudid [hydromorphone Hcl] Nausea And Vomiting   Hydrochlorothiazide Other (See Comments)   Decreased GFR (Nov 2015)   Liothyronine    Hair fell out, caused headaches    Nsaids    CKD stage III - avoid nephrotoxic drugs   Nubain [nalbuphine Hcl]    Burning sensation in back   Penicillins Itching   Prasugrel Itching   Statins Itching        Medication List     STOP taking these medications    spironolactone 25 MG tablet Commonly known as: ALDACTONE  TAKE these  medications    acetaminophen 325 MG tablet Commonly known as: TYLENOL Take 1-2 tablets (325-650 mg total) by mouth every 4 (four) hours as needed for mild pain (or temp >/= 101 F).   amLODipine 10 MG tablet Commonly known as: NORVASC Take 1 tablet (10 mg total) by mouth daily.   aspirin 81 MG tablet Take 81 mg by mouth daily.   clopidogrel 75 MG tablet Commonly known as: PLAVIX Take 1 tablet (75 mg total) by mouth daily. To prevent strokes What changed: when to take this   docusate sodium 100 MG capsule Commonly known as: COLACE Take 1 capsule (100 mg total) by mouth daily.   ezetimibe 10 MG tablet Commonly known as: ZETIA Take 1 tablet (10 mg total) by mouth daily.   ferrous sulfate 325 (65 FE) MG tablet Take 1 tablet (325 mg total) by mouth daily with breakfast.   labetalol 300 MG tablet Commonly known as: NORMODYNE Take 1 tablet (300 mg total) by mouth 2 (two) times daily. For hypertension   nortriptyline 10 MG capsule Commonly known as: PAMELOR Take 1 capsule (10 mg total) by mouth 2 (two) times daily. For chronic headaches   oxyCODONE-acetaminophen 5-325 MG tablet Commonly known as: PERCOCET/ROXICET Take 1-2 tablets by mouth every 4 (four) hours as needed for moderate pain.   piperacillin-tazobactam 3.375 GM/50ML IVPB Commonly known as: ZOSYN Inject 50 mLs (3.375 g total) into the vein every 8 (eight) hours for 9 days.   ranolazine 500 MG 12 hr tablet Commonly known as: RANEXA Take 1 tablet (500 mg total) by mouth 2 (two) times daily.        Discharge Exam: BP (!) 205/79 (BP Location: Left Arm)   Pulse 80   Temp 98.1 F (36.7 C) (Oral)   Resp 16   Ht 5\' 1"  (1.549 m)   Wt 74.1 kg   SpO2 99%   BMI 30.87 kg/m   General exam: Appears calm and comfortable Respiratory system: Clear to auscultation. Respiratory effort normal. Cardiovascular system: S1 & S2 heard, RRR. No murmurs. Gastrointestinal system: Abdomen is nondistended, soft and nontender.  Normal bowel sounds heard. Central nervous system: Alert and oriented. No focal neurological deficits. Musculoskeletal: No edema. No calf tenderness Psychiatry: Judgement and insight appear normal. Mood & affect appropriate.   Condition at discharge: stable  The results of significant diagnostics from this hospitalization (including imaging, microbiology, ancillary and laboratory) are listed below for reference.   Imaging Studies: DG Abd Portable 1V  Result Date: 04/04/2022 CLINICAL DATA:  Abdominal distension. EXAM: PORTABLE ABDOMEN - 1 VIEW COMPARISON:  07/04/2017.  CT, 08/04/2017. FINDINGS: Normal bowel gas pattern. Bilateral iliac vascular stents, stable. Previous cholecystectomy. No evidence of renal or ureteral stones. Bilateral total hip arthroplasties, incompletely imaged, grossly unchanged. No acute skeletal abnormality. IMPRESSION: 1. No acute findings. No bowel dilation to account for abdominal distension. Electronically Signed   By: Lajean Manes M.D.   On: 04/04/2022 13:20   US RENAL  Result Date: 04/02/2022 CLINICAL DATA:  Acute kidney injury EXAM: RENAL / URINARY TRACT ULTRASOUND COMPLETE COMPARISON:  None Available. FINDINGS: Right Kidney: Renal measurements: 10.3 x 3.6 x 3.1 cm = volume: 59 mL. Echogenicity within normal limits. No mass or hydronephrosis visualized. Left Kidney: Renal measurements: 9.7 x 4.0 x 4.3 cm = volume: 86 mL. Echogenicity within normal limits. No mass or hydronephrosis visualized. Bladder: Decompressed urinary bladder. Other: None. IMPRESSION: 1. No hydronephrosis. 2. Decompressed urinary bladder. Electronically Signed   By: Cristie Hem  Cherly Beach M.D.   On: 04/02/2022 15:59   DG ABD ACUTE 2+V W 1V CHEST  Result Date: 03/29/2022 CLINICAL DATA:  Postoperative wound infection.  Abdominal pain. EXAM: DG ABDOMEN ACUTE WITH 1 VIEW CHEST COMPARISON:  Chest radiograph 09/22/2021. FINDINGS: Median sternotomy stable cardiomegaly with unchanged mediastinal contours. Aortic  atherosclerosis. There is mild peribronchial thickening. No focal airspace disease or pleural effusion. No free intra-abdominal air. Scattered air throughout small and large bowel in a nonobstructive pattern. Cholecystectomy clips in the right upper quadrant. Iliac stents. Surgical clips project over both inguinal regions. Bilateral hip arthroplasties. IMPRESSION: 1. Nonobstructive bowel gas pattern. No free air. 2. Cardiomegaly with mild peribronchial thickening. Electronically Signed   By: Keith Rake M.D.   On: 03/29/2022 23:01   ECHOCARDIOGRAM COMPLETE  Result Date: 03/18/2022    ECHOCARDIOGRAM REPORT   Patient Name:   Makeda A [facility] Date of Exam: 03/18/2022 Medical Rec #:  [id number]          Height:       61.0 in Accession #:    [id number]         Weight:       161.6 lb Date of Birth:  1941-09-06          BSA:          1.725 m Patient Age:    30 years           BP:           133/64 mmHg Patient Gender: F                  HR:           73 bpm. Exam Location:  [facility] Procedure: 2D Echo, Cardiac Doppler and Color Doppler Indications:     Cardiac arrest I46.9  History:         Patient has no prior history of Echocardiogram examinations.                  CHF, CAD, Prior CABG; Risk Factors:Hypertension.  Sonographer:     Sherrie Sport Referring Phys:  [id number] Flora Lipps Diagnosing Phys: Donnelly Angelica  Sonographer Comments: Technically challenging study due to limited acoustic windows, no apical window and no subcostal window. IMPRESSIONS  1. Left ventricular ejection fraction, by estimation, is 60 to 65%. The left ventricle has normal function. The left ventricle has no regional wall motion abnormalities. There is moderate left ventricular hypertrophy. Left ventricular diastolic function  could not be evaluated.  2. The mitral valve is abnormal. Trivial mitral valve regurgitation. Moderate mitral annular calcification.  3. The aortic valve is calcified. Aortic valve regurgitation is not visualized.  Conclusion(s)/Recommendation(s): Very limited study. Normal LV function. Unable to assess for mitral stensos which previously was moderate to severe. Unable to assess for AS, and no TR velocity obtained to assess RVSP. FINDINGS  Left Ventricle: Left ventricular ejection fraction, by estimation, is 60 to 65%. The left ventricle has normal function. The left ventricle has no regional wall motion abnormalities. The left ventricular internal cavity size was normal in size. There is  moderate left ventricular hypertrophy. Left ventricular diastolic function could not be evaluated. Pericardium: There is no evidence of pericardial effusion. Mitral Valve: The mitral valve is abnormal. Moderate mitral annular calcification. Trivial mitral valve regurgitation. Tricuspid Valve: The tricuspid valve is normal in structure. Tricuspid valve regurgitation is mild. Aortic Valve: The aortic valve is calcified. Aortic valve regurgitation is not visualized. Pulmonic Valve: The pulmonic  valve was not well visualized. Pulmonic valve regurgitation is not visualized. No evidence of pulmonic stenosis. Aorta: The aortic root is normal in size and structure.  LEFT VENTRICLE PLAX 2D LVIDd:         3.80 cm LVIDs:         2.10 cm LV PW:         1.30 cm LV IVS:        1.30 cm LVOT diam:     2.00 cm LVOT Area:     3.14 cm  LEFT ATRIUM         Index LA diam:    3.90 cm 2.26 cm/m                        PULMONIC VALVE AORTA                 PV Vmax:        0.85 m/s Ao Root diam: 2.90 cm PV Vmean:       48.900 cm/s                       PV VTI:         0.124 m                       PV Peak grad:   2.9 mmHg                       PV Mean grad:   1.0 mmHg                       RVOT Peak grad: 6 mmHg   SHUNTS Systemic Diam: 2.00 cm Pulmonic VTI:  0.241 m Donnelly Angelica Electronically signed by Donnelly Angelica Signature Date/Time: 03/18/2022/10:44:10 AM    Final    DG C-Arm 1-60 Min-No Report  Result Date: 03/17/2022 Fluoroscopy was utilized by the requesting  physician.  No radiographic interpretation.    Microbiology: Results for orders placed or performed during the hospital encounter of 03/29/22  Surgical PCR screen     Status: Abnormal   Collection Time: 03/30/22  4:00 AM   Specimen: Nasal Mucosa; Nasal Swab  Result Value Ref Range Status   MRSA, PCR NEGATIVE NEGATIVE Final   Staphylococcus aureus POSITIVE (A) NEGATIVE Final    Comment: (NOTE) The Xpert SA Assay (FDA approved for NASAL specimens in patients 32 years of age and older), is one component of a comprehensive surveillance program. It is not intended to diagnose infection nor to guide or monitor treatment. Performed at [facility], [place] [address] [place], [place] [address]   Aerobic/Anaerobic Culture w Gram Stain (surgical/deep wound)     Status: None   Collection Time: 03/30/22  8:49 AM   Specimen: PATH Cytology Misc. fluid; Body Fluid  Result Value Ref Range Status   Specimen Description TISSUE  Final   Special Requests RIGHT GROIN  Final   Gram Stain   Final    FEW WBC PRESENT, PREDOMINANTLY PMN NO ORGANISMS SEEN    Culture   Final    RARE PSEUDOMONAS AERUGINOSA RESULT CALLED TO, READ BACK BY AND VERIFIED WITH: RN CATHY A [id number] MLM NO ANAEROBES ISOLATED Performed at [facility], [place] [address] [place], [place] [address]    Report Status 04/04/2022 FINAL  Final   Organism ID, Bacteria PSEUDOMONAS AERUGINOSA  Final      Susceptibility   Pseudomonas aeruginosa - MIC*    CEFTAZIDIME 4 SENSITIVE Sensitive     CIPROFLOXACIN 0.5 SENSITIVE Sensitive     GENTAMICIN <=1 SENSITIVE Sensitive     IMIPENEM 2 SENSITIVE Sensitive     PIP/TAZO 8 SENSITIVE Sensitive     CEFEPIME 2 SENSITIVE Sensitive     * RARE PSEUDOMONAS AERUGINOSA    Labs: CBC: Recent Labs  Lab 03/29/22 1210 03/30/22 0343 03/30/22 0950 04/02/22 0822 04/03/22 0029 04/03/22 1054 04/04/22 0241 04/05/22 0416  WBC 20.1* 15.3*   < > 14.1* 11.1* 10.2 10.9* 9.9  NEUTROABS 18.4*  12.0*  --   --  7.7  --  7.0 6.9  HGB 9.9* 8.6*   < > 7.6* 6.9* 8.9* 8.6* 8.6*  HCT 29.8* 26.2*   < > 23.5* 22.2* 26.9* 26.3* 25.8*  MCV 90.9 89.4   < > 92.5 95.7 89.4 88.0 87.5  PLT 399 391   < > 328 308 321 PLATELET CLUMPS NOTED ON SMEAR, UNABLE TO ESTIMATE 342   < > = values in this interval not displayed.   Basic Metabolic Panel: Recent Labs  Lab 03/30/22 1527 03/31/22 0630 04/01/22 0017 04/02/22 0822 04/03/22 0029 04/04/22 0241 04/05/22 0416  NA 135 133* 133* 138 139 140 140  K 4.0 4.4 4.2 3.8 4.2 4.3 3.9  CL 107 107 105 108 112* 113* 113*  CO2 22 21* 21* 20* 19* 19* 18*  GLUCOSE 126* 111* 110* 120* 113* 93 98  BUN 21 19 24* 26* 21 15 10   CREATININE 1.61* 1.76* 2.11* 2.31* 2.06* 1.50* 1.30*  CALCIUM 8.0* 8.4* 8.1* 8.6* 8.2* 8.7* 8.6*  MG 1.7 2.5*  --   --   --   --   --   PHOS  --  3.6  --   --   --   --   --    Liver Function Tests: Recent Labs  Lab 03/29/22 1210  AST 18  ALT 15  ALKPHOS 75  BILITOT 0.7  PROT 7.0  ALBUMIN 3.0*   CBG: Recent Labs  Lab 04/03/22 0616 04/03/22 1143 04/03/22 1530 04/03/22 2147 04/04/22 0614  GLUCAP 106* 101* 99 111* 82    Discharge time spent: 35 minutes.  Signed: Cordelia Poche, MD [facility] 04/05/2022

## 2022-04-05 NOTE — Progress Notes (Signed)
Inpatient Rehabilitation Admission Medication Review by a Pharmacist  A complete drug regimen review was completed for this patient to identify any potential clinically significant medication issues.  High Risk Drug Classes Is patient taking? Indication by Medication  Antipsychotic No   Anticoagulant No   Antibiotic Yes Zosyn - wound infection  Opioid Yes Percocet prn - pain  Antiplatelet Yes Aspirin and Plavix - s/p endarterectomy  Hypoglycemics/insulin No   Vasoactive Medication Yes Amlodipine and Labetalol - blood pressure Ranolazine - chronic angina  Chemotherapy No   Other Yes Zetia - hyperlipidemia (intolerant to statins) Nortriptyline - chronic headaches Iron - supplement Prns: docusate and miralax - constipation Tylenol - mild pain     Type of Medication Issue Identified Description of Issue Recommendation(s)  Drug Interaction(s) (clinically significant)     Duplicate Therapy     Allergy     No Medication Administration End Date  Zosyn expected thru 6/21 (total 2 weeks), then transition to oral Cipro. Vascular Surgery to finalize duration of antibiotic course. Will follow up duration of antibiotic course.  Incorrect Dose     Additional Drug Therapy Needed     Significant med changes from prior encounter (inform family/care partners about these prior to discharge).    Other  Spironolactone and Irbesartan (for PTA Valsartan) were stopped on 03/29/22 for AKI.     Clinically significant medication issues were identified that warrant physician communication and completion of prescribed/recommended actions by midnight of the next day:  No  Pharmacist comments:    S/p I&D 6/6, operative culture grew rare Pseudomonas. Zosyn begun 03/30/22 > anticipating 2 weeks total of Zosyn (thru 6/21), then transition to oral Cipro per discharge note, or per Vascular Surgery.  Zosyn dose adjusted for worsening renal function on 6/9, then back to 3.375 gm IV q8h (each dose over 4 hours) on  6/9 with improved renal function. Noted history of itching to PCN but has tolerated Zosyn.  Time spent performing this drug regimen review (minutes):  20   Arty Baumgartner, [place] 04/05/2022 5:45 PM

## 2022-04-05 NOTE — Progress Notes (Signed)
Inpatient Rehabilitation Admissions Coordinator   I met at bedside with patient , Dr Virl Cagey, daughter and son in law. Vascular gave clearance to discharge to CIR today. Patient is in agreement. I will notify Dr Lonny Prude, acute team and TOC to make the arrangements.  Danne Baxter, RN, MSN Rehab Admissions Coordinator [phone] 04/05/2022 10:11 AM

## 2022-04-05 NOTE — Progress Notes (Signed)
[facility] [place]  Subjective  - Doing a little better each day    Objective (!) 203/76 75 98 F (36.7 C) (Oral) 18 100%  Intake/Output Summary (Last 24 hours) at 04/05/2022 0714 Last data filed at 04/05/2022 0434 Gross per 24 hour  Intake 4329.24 ml  Output 75 ml  Net 4254.24 ml    Doppler signals DP/PT intact, no neurologic deficits Right groin black sponge vac to suction, left groin soft with dry dressing in place  Lungs non labored breathing General no acute distress  Assessment/Planning: POD # 5  81 y.o. female is s/p Incision and debridement of the right groin wound Reexposure of the common femoral artery Common femoral artery patch excision Common femoral patch plasty using ipsilateral greater saphenous vein Myriad Morcel placement VAC placement   Good inflow with doppler signals Good seal right groin vac  OP 75 cc total last 24 hours Hypertensive issues being addressed by IM MD Anemia unknown cause HGB 8.6.  No active bleeding at right groin site 75 cc last 24 hours. Cultures with few pseudomonas  final growth Cont. Zosyn PT/OT for mobility  Roxy Horseman 04/05/2022 7:14 AM --  Laboratory Lab Results: Recent Labs    04/04/22 0241 04/05/22 0416  WBC 10.9* 9.9  HGB 8.6* 8.6*  HCT 26.3* 25.8*  PLT PLATELET CLUMPS NOTED ON SMEAR, UNABLE TO ESTIMATE 342   BMET Recent Labs    04/04/22 0241 04/05/22 0416  NA 140 140  K 4.3 3.9  CL 113* 113*  CO2 19* 18*  GLUCOSE 93 98  BUN 15 10  CREATININE 1.50* 1.30*  CALCIUM 8.7* 8.6*    COAG Lab Results  Component Value Date   INR 1.3 (H) 03/31/2022   INR 1.3 (H) 03/30/2022   INR 1.3 (H) 03/30/2022   No results found for: "PTT"

## 2022-04-05 NOTE — Progress Notes (Signed)
[facility] [place]   VASCULAR STAFF ADDENDUM: I have independently interviewed and examined the patient. I agree with the above.  Pt seen - progressing appropriately. Dressing change Tuesday, Thursday  Will continue to follow in CIR  J. Melene Muller, MD [facility] Phone Number: [phone] 04/05/2022 5:10 PM     Subjective  - Doing a little better each day    Objective (!) 156/55 75 98.4 F (36.9 C) (Oral) 14   No intake or output data in the 24 hours ending 04/05/22 1710   Doppler signals DP/PT intact, no neurologic deficits Right groin black sponge vac to suction, left groin soft with dry dressing in place  Lungs non labored breathing General no acute distress  Assessment/Planning: POD # 5  81 y.o. female is s/p Incision and debridement of the right groin wound Reexposure of the common femoral artery Common femoral artery patch excision Common femoral patch plasty using ipsilateral greater saphenous vein Myriad Morcel placement VAC placement   Good inflow with doppler signals Good seal right groin vac  OP 75 cc total last 24 hours Hypertensive issues being addressed by IM MD Anemia unknown cause HGB 8.6.  No active bleeding at right groin site 75 cc last 24 hours. Cultures with few pseudomonas  final growth Cont. Zosyn PT/OT for mobility  Broadus John 04/05/2022 5:10 PM --  Laboratory Lab Results: Recent Labs    04/04/22 0241 04/05/22 0416  WBC 10.9* 9.9  HGB 8.6* 8.6*  HCT 26.3* 25.8*  PLT PLATELET CLUMPS NOTED ON SMEAR, UNABLE TO ESTIMATE 342    BMET Recent Labs    04/04/22 0241 04/05/22 0416  NA 140 140  K 4.3 3.9  CL 113* 113*  CO2 19* 18*  GLUCOSE 93 98  BUN 15 10  CREATININE 1.50* 1.30*  CALCIUM 8.7* 8.6*     COAG Lab Results  Component Value Date   INR 1.3 (H) 03/31/2022   INR 1.3 (H) 03/30/2022   INR 1.3 (H) 03/30/2022   No results found for:  "PTT"

## 2022-04-05 NOTE — H&P (Signed)
Physical Medicine and Rehabilitation Admission H&P       HPI: Sabrina [name] is an 81 year old right-handed female with history of obesity BMI 30.53, chronic hip pain, chronic anemia, CAD/CABG x3 with LIMA-LAD,SVG-ramus intermedius 03/2000 with recent heart catheterization 11/18/2021 showing extensive native CAD and 100% occlusion SVG-RCA and 90%, distal SVG-OM with significant stenosis of her left subclavian status post left subclavian stenting 11/23/2021 maintained on low-dose aspirin, diastolic congestive heart failure, carotid artery angioplasty 2004, CKD stage III, hypertension, hyperlipidemia, peripheral vascular disease with multiple interventions in the past.  Per chart review lives alone.  1 level home.  Family in area with good support.  Presented to [facility] 03/17/2022 for elective bilateral superior femoral artery and left iliac stenting with vascular surgery due to peripheral vascular disease and debilitating claudication with some mild rest pain.  Underwent left common femoral, profunda femoris and superficial femoral artery endarterectomies as well as right common femoral profunda femoris and superficial femoral artery enterectomy's stent placement to left external iliac artery with 8 mm diameter by 4 cm length of life star stent 03/17/2022 per Dr. Leotis Pain.  Postoperative acute respiratory failure cardiac arrest requiring CPR x2 minutes.  Found to have markedly elevated troponin of 15,000.  She did require intubation for airway protection and extubated same day.  Echocardiogram with ejection fraction of 60 to 65% no wall motion abnormality.  Maintained on aspirin and Plavix for least 12 months and then plan to discuss outpatient cardiac catheterization.  Ranexa ongoing at 500 mg twice daily per cardiology service Dr. Lujean Amel.  Ongoing wound care bilateral groin dressings with noted drainage wound VAC of been removed 03/22/2022 wound cultures pending.  Tolerating a regular diet.  Acute on  chronic anemia 8.3 monitor transfused 5/27 1 unit packed red blood cells.  Therapy evaluations completed patient was admitted for a comprehensive rehab program 03/24/2022.  Patient was slow but progressive gains with functional mobility.  Patient with persistent drainage from right groin wound she was covered with Keflex for recent wound cultures.  Follow-up chemistries noted leukocytosis 15,600 with hemoglobin 9.5, chemistries unremarkable except BUN 24 creatinine 1.91, blood cultures no growth to date, surgical PCR screening positive.  Vascular surgery consulted 03/29/2022 and felt debridement was needed.  Patient was discharged to acute care services 03/29/2022 and underwent incision and debridement of right groin wound reexposure of the common femoral artery common femoral artery patch excision common femoral patch plasty using ipsilateral greater saphenous vein and wound VAC placement 03/30/2022 per Dr. Orlie Pollen through 04/06/2022.  Patient's aspirin and Plavix ongoing as prior to surgical intervention.  Currently maintained on Zosyn for wound coverage and awaiting plan of duration.  Therapy evaluations resumed and patient was admitted back to inpatient rehab services to continue comprehensive intensive rehab therapies.   Review of Systems  Constitutional:  Negative for fever.  HENT:  Negative for hearing loss.   Eyes:  Negative for blurred vision and double vision.  Respiratory:  Negative for cough and shortness of breath.   Cardiovascular:  Positive for leg swelling. Negative for chest pain and palpitations.  Gastrointestinal:  Positive for constipation and nausea. Negative for heartburn and vomiting.       GERD  Genitourinary:  Negative for dysuria, flank pain and hematuria.  Musculoskeletal:  Positive for joint pain and myalgias.  Skin:  Negative for rash.  Psychiatric/Behavioral:         Anxiety  All other systems reviewed and are negative.  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 b.) TTE 11/16/2021: EF >55%; mild AS (MPG 9 mmHg)   CAD (coronary artery disease)      a.) s/p 3v CABG 03/29/2000   Carotid artery stenosis      a.) s/p LEFT CEA 09/09/2003. b.) Carotid doppler 49/70/2637: 8-58% LICA, CTO RICA; subclavian stenosis   Cataracts, bilateral     Cervical spondylosis without myelopathy     Chronic diastolic CHF (congestive heart failure), NYHA class 3 (HCC)      a.) TTE 05/27/2016: EF >55%, mild LA enlargement, triv PR, mild MR, mod TR; G3DD. b.) TTE 12/12/2017: EF >55%, mild LVH, BAE, mild MR/PR, mod TR; RVSP 52.8 mmHg. c.) TTE 03/18/2020: EF >55%, BAD, AS (MPG 8.7 mmHg); triv MR, mild TR/PR. d.) TTE 11/16/2021: EF >55%, LVH, G1DD, triv MR, mild PR, mod TR; AS (MPG 9 mmHg); MS (MPG 5 mmHg)   Chronic kidney disease, stage III (moderate) (HCC)     Chronic narcotic use 06/24/2014   Chronic pain syndrome     GERD (gastroesophageal reflux disease)     History of 2019 novel coronavirus disease (COVID-19) 09/22/2021   Hyperlipidemia     Hypertension     Long term current use of antithrombotics/antiplatelets      a.) on daily DAPT therapy (ASA + clopidogrel)   Lumbar stenosis with neurogenic claudication     Mitral stenosis 11/16/2021    a.) TTE 11/16/2021: EF >55%; mod MS (MPG 5 mmHg)   Osteoarthritis of hip     Pulmonary hypertension ([place]) 12/12/2017    a.) TTE 12/12/2017: mild; RVSP 52.8 mmHg   PVD (peripheral vascular disease) (HCC)     Renal artery stenosis (HCC)     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         Past Surgical History:  Procedure Laterality Date   ABDOMINAL HYSTERECTOMY   1976   CAROTID ARTERY ANGIOPLASTY Left     CAROTID ENDARTERECTOMY Left 09/09/2003    Procedure: CAROTID ENDARTERECTOMY; Location: [facility]; Surgeon: Maura Crandall, MD   COLONOSCOPY WITH PROPOFOL N/A 08/16/2017    Procedure: COLONOSCOPY WITH PROPOFOL;  Surgeon: [name], MD;  Location: [facility];  Service: Endoscopy;  Laterality: N/A;   CORONARY ANGIOPLASTY WITH STENT PLACEMENT   2000   CORONARY ARTERY BYPASS GRAFT N/A 03/29/2000    Procedure: 3v CORONARY ARTERY BYPASS GRAFT; Location: [facility]   CYSTOSCOPY WITH STENT PLACEMENT Bilateral     ENDARTERECTOMY FEMORAL Bilateral 03/17/2022    Procedure: ENDARTERECTOMY FEMORAL ( BILATERAL SFA STENT);  Surgeon: Katha Cabal, MD;  Location: [facility];  Service: Vascular;  Laterality: Bilateral;   ENDARTERECTOMY FEMORAL Right 03/30/2022    Procedure: RE-EXPOSURE OF RIGHT COMMON FEMORAL ARTERY, RE-DO RIGHT FEMORAL ENDARTERECTOMY WITH VEIN PATCH;  Surgeon:  Broadus John, MD;  Location: [facility];  Service: Vascular;  Laterality: Right;  WOUND VAC   ESOPHAGOGASTRODUODENOSCOPY (EGD) WITH PROPOFOL N/A 08/16/2017    Procedure: ESOPHAGOGASTRODUODENOSCOPY (EGD) WITH PROPOFOL;  Surgeon: [name], MD;  Location: [facility];  Service: Endoscopy;  Laterality: N/A;   ESOPHAGOGASTRODUODENOSCOPY (EGD) WITH PROPOFOL N/A 06/29/2018    Procedure: ESOPHAGOGASTRODUODENOSCOPY (EGD) WITH PROPOFOL;  Surgeon: Virgel Manifold, MD;  Location: [facility];  Service: Endoscopy;  Laterality: N/A;   GROIN DEBRIDEMENT Right 03/30/2022    Procedure: [place];  Surgeon: Broadus John, MD;  Location: [place];  Service: Vascular;  Laterality: Right;   INSERTION OF ILIAC STENT  Left 03/17/2022    Procedure: INSERTION OF ILIAC STENT;  Surgeon: Katha Cabal, MD;  Location: [facility];  Service: Vascular;  Laterality: Left;   LAPAROSCOPIC CHOLECYSTECTOMY Left 10/26/1999    Procedure: LAPAROSCOPIC CHOLECYSTECTOMY; Location: [facility]; Surgeon: Rochel Brome, MD   LEFT HEART CATH AND CORONARY ANGIOGRAPHY N/A 11/18/2021    Procedure: LEFT HEART CATH AND CORONARY ANGIOGRAPHY;  Surgeon: Yolonda Kida, MD;  Location: [facility];  Service: Cardiovascular;  Laterality: N/A;   LEFT HEART CATH AND CORS/GRAFTS ANGIOGRAPHY Left 11/19/2002    Procedure: LEFT HEART CATH AND CORS/GRAFTS ANGIOGRAPHY; Location: [place]; Surgeon: Katrine Coho, MD   LEFT HEART CATH AND CORS/GRAFTS ANGIOGRAPHY Left 09/17/2003    Procedure: LEFT HEART CATH AND CORS/GRAFTS ANGIOGRAPHY; Location: [place]; Surgeon: Katrine Coho, MD   LOWER EXTREMITY ANGIOGRAPHY Right 01/06/2022    Procedure: Lower Extremity Angiography;  Surgeon: Katha Cabal, MD;  Location: [place] [facility];  Service: Cardiovascular;  Laterality: Right;   RENAL ARTERY ANGIOPLASTY Bilateral 12/2013   TOE AMPUTATION Right      small toe   TONSILLECTOMY AND ADENOIDECTOMY       TOTAL HIP ARTHROPLASTY Left 2005   TOTAL HIP ARTHROPLASTY Right 2015   UPPER EXTREMITY ANGIOGRAPHY Left 11/23/2021    Procedure: UPPER EXTREMITY ANGIOGRAPHY;  Surgeon: Algernon Huxley, MD;  Location: [place] [facility];  Service: Cardiovascular;  Laterality: Left;         Family History  Problem Relation Age of Onset   Stroke Mother     Hypertension Mother     Diabetes Mother     Hypertension Father     Heart disease Sister          MI   Multiple sclerosis Daughter     Multiple sclerosis Son     Cerebral aneurysm Son     Seizures Son     Cerebral aneurysm Son     Breast cancer Paternal Aunt 80    Social History:  reports that she quit smoking about 23 years ago. Her smoking use included cigarettes. She has never used smokeless tobacco.  She reports that she does not drink alcohol and does not use drugs. Allergies:       Allergies  Allergen Reactions   Citalopram Anaphylaxis      Throat closing    Dilaudid [Hydromorphone Hcl] Nausea And Vomiting   Hydrochlorothiazide Other (See Comments)      Decreased GFR (Nov 2015)   Liothyronine        Hair fell out, caused headaches    Nsaids        CKD stage III - avoid nephrotoxic drugs   Nubain [Nalbuphine Hcl]        Burning sensation in back   Penicillins Itching   Prasugrel Itching   Statins  Itching          Medications Prior to Admission  Medication Sig Dispense Refill   acetaminophen (TYLENOL) 325 MG tablet Take 1-2 tablets (325-650 mg total) by mouth every 4 (four) hours as needed for mild pain (or temp >/= 101 F).       aspirin 81 MG tablet Take 81 mg by mouth daily.       clopidogrel (PLAVIX) 75 MG tablet Take 1 tablet (75 mg total) by mouth daily. To prevent strokes (Patient taking differently: Take 75 mg by mouth in the morning and at bedtime. To prevent strokes) 90 tablet 1   docusate sodium (COLACE) 100 MG capsule Take 1 capsule (100 mg total) by mouth daily. 10 capsule 0   ezetimibe (ZETIA) 10 MG tablet Take 1 tablet (10 mg total) by mouth daily. 90 tablet 1   ferrous sulfate 325 (65 FE) MG tablet Take 1 tablet (325 mg total) by mouth daily with breakfast.   3   nortriptyline (PAMELOR) 10 MG capsule Take 1 capsule (10 mg total) by mouth 2 (two) times daily. For chronic headaches 180 capsule 1   oxyCODONE-acetaminophen (PERCOCET/ROXICET) 5-325 MG tablet Take 1-2 tablets by mouth every 4 (four) hours as needed for moderate pain. 30 tablet 0   ranolazine (RANEXA) 500 MG 12 hr tablet Take 1 tablet (500 mg total) by mouth 2 (two) times daily. 60 tablet 3   amLODipine (NORVASC) 10 MG tablet Take 1 tablet (10 mg total) by mouth daily. (Patient not taking: Reported on 03/29/2022)       labetalol (NORMODYNE) 300 MG tablet Take 1 tablet (300 mg total) by mouth 2 (two) times  daily. For hypertension 180 tablet 1   spironolactone (ALDACTONE) 25 MG tablet Take 1 tablet (25 mg total) by mouth daily. (Patient not taking: Reported on 03/29/2022)              Home: Home Living Family/patient expects to be discharged to:: Inpatient rehab Living Arrangements: Alone Available Help at Discharge: Family, Available 24 hours/day (daughter, [name], from [place] here  to [employer] as needed) Type of Home: House Home Access: Level entry Home Layout: One level Bathroom Shower/Tub: Public librarian, Multimedia programmer: Standard Bathroom Accessibility: Yes Home Equipment: Conservation officer, nature (2 wheels), [employer] - single point Additional Comments: Patient stating Daughter and SIL will be staying with her for a few days after she returns from [place] With: Alone   Functional History: Prior Function Prior Level of Function : Independent/Modified Independent, Driving Mobility Comments: Typically independent without DME. ADLs Comments: Prior to initial admission, indep with cooking, cleaning, bathing, dressing.  Continues to drive.  No assist with bill payment or medication management.   Functional Status:  Mobility: Bed Mobility Overal bed mobility: Needs Assistance Bed Mobility: Supine to Sit Supine to sit: Min assist General bed mobility comments: pt able to assist LLE to EOB with use of gait belt for AAROM, significant increased time and effort, still reliant on use of bed rail and modA to scoot L hip to EOB Transfers Overall transfer level: Needs assistance Equipment used: Rolling walker (2 wheels) Transfers: Sit to/from Stand Sit to Stand: Min assist Bed to/from chair/wheelchair/BSC transfer type:: Step pivot Step pivot transfers: Min assist General transfer comment: increased time and effort, multiple sit<>stands from EOB and recliner to RW, repeated cues for hand placement, minA for trunk elevation and stability; external assist to stabilize  RW Ambulation/Gait Ambulation/Gait assistance: Min assist Gait Distance (Feet): 40 Feet  Assistive device: Rolling walker (2 wheels) Gait Pattern/deviations: Step-to pattern, Decreased weight shift to left, Trunk flexed, Antalgic General Gait Details: slow, antalgic, guarded gait with RW and minA for stability and RW management; frequent cues to maintain closer proximity to RW and upright posture as pt frequently leaning R forearm against RW; pt requires encouragement to progress gait distance; intermittent standing rest breaks before seated rest secondary to pain and fatigue Gait velocity: decreased Gait velocity interpretation: <1.31 ft/sec, indicative of household ambulator   ADL: ADL Grooming: Wash/dry hands, Wash/dry face, Set up, Sitting Upper Body Bathing: Sitting, Minimal assistance Lower Body Bathing: Moderate assistance, Sit to/from stand Upper Body Dressing : Sitting, Minimal assistance Upper Body Dressing Details (indicate cue type and reason): IV lines and wrap to R forearm Lower Body Dressing: Moderate assistance, Sit to/from stand Toilet Transfer: Minimal assistance, Stand-pivot, BSC/3in1   Cognition: Cognition Overall Cognitive Status: Within Functional Limits for tasks assessed Arousal/Alertness: Awake/alert Orientation Level: Oriented X4 Cognition Arousal/Alertness: Awake/alert Behavior During Therapy: WFL for tasks assessed/performed, Flat affect Overall Cognitive Status: Within Functional Limits for tasks assessed General Comments: safety cues as needed   Physical Exam: Blood pressure (!) 203/76, pulse 75, temperature 98 F (36.7 C), temperature source Oral, resp. rate 18, height 5\' 1"  (1.549 m), weight 74.1 kg, SpO2 100 %. Physical Exam Skin:    Comments: Groin incision is dressed with wound VAC in place  Neurological:     Comments: Patient is alert.  Mood is a bit flat but appropriate.  Oriented x3 and follows commands.      General: No acute  distress Mood and affect are appropriate Heart: Regular rate and rhythm no rubs murmurs or extra sounds Lungs: Clear to auscultation, breathing unlabored, no rales or wheezes Abdomen: Positive bowel sounds, soft nontender to palpation, nondistended Extremities: No clubbing, cyanosis, or edema Skin: No evidence of breakdown, no evidence of rash Neurologic: Cranial nerves II through XII intact, motor strength is 5/5 in bilateral deltoid, bicep, tricep, grip, 3- Bilateral hip flexor, knee extensors, 4/5 ankle dorsiflexor and plantar flexor Sensory exam normal sensation to light touch and proprioception in bilateral upper and lower extremities Cerebellar exam normal finger to nose to finger as well as heel to shin in bilateral upper and lower extremities Musculoskeletal: pain with B Hip ROM, no UE swelling or joint deformity, Neg knee or ankle effusion , mild tenderness right thigh Feet , s/p Right 5th toe amp well healed.  No other deformity , no skin breakdown    Lab Results Last 48 Hours        Results for orders placed or performed during the hospital encounter of 03/29/22 (from the past 48 hour(s))  Glucose, capillary     Status: Abnormal    Collection Time: 04/03/22  6:16 AM  Result Value Ref Range    Glucose-Capillary 106 (H) 70 - 99 mg/dL      Comment: Glucose reference range applies only to samples taken after fasting for at least 8 hours.  Reticulocytes     Status: Abnormal    Collection Time: 04/03/22 10:54 AM  Result Value Ref Range    Retic Ct Pct 2.3 0.4 - 3.1 %    RBC. 2.95 (L) 3.87 - 5.11 MIL/uL    Retic Count, Absolute 66.4 19.0 - 186.0 K/uL    Immature Retic Fract 28.3 (H) 2.3 - 15.9 %      Comment: Performed at [facility] [address], [place], [place] [address]  CBC  Status: Abnormal    Collection Time: 04/03/22 10:54 AM  Result Value Ref Range    WBC 10.2 4.0 - 10.5 K/uL    RBC 3.01 (L) 3.87 - 5.11 MIL/uL    Hemoglobin 8.9 (L) 12.0 - 15.0 g/dL       Comment: REPEATED TO VERIFY POST TRANSFUSION SPECIMEN      HCT 26.9 (L) 36.0 - 46.0 %    MCV 89.4 80.0 - 100.0 fL    MCH 29.6 26.0 - 34.0 pg    MCHC 33.1 30.0 - 36.0 g/dL    RDW 16.4 (H) 11.5 - 15.5 %    Platelets 321 150 - 400 K/uL    nRBC 0.0 0.0 - 0.2 %      Comment: Performed at [facility] [address], [place], [place] [address]  Glucose, capillary     Status: Abnormal    Collection Time: 04/03/22 11:43 AM  Result Value Ref Range    Glucose-Capillary 101 (H) 70 - 99 mg/dL      Comment: Glucose reference range applies only to samples taken after fasting for at least 8 hours.  Glucose, capillary     Status: None    Collection Time: 04/03/22  3:30 PM  Result Value Ref Range    Glucose-Capillary 99 70 - 99 mg/dL      Comment: Glucose reference range applies only to samples taken after fasting for at least 8 hours.  Glucose, capillary     Status: Abnormal    Collection Time: 04/03/22  9:47 PM  Result Value Ref Range    Glucose-Capillary 111 (H) 70 - 99 mg/dL      Comment: Glucose reference range applies only to samples taken after fasting for at least 8 hours.  Basic metabolic panel     Status: Abnormal    Collection Time: 04/04/22  2:41 AM  Result Value Ref Range    Sodium 140 135 - 145 mmol/L    Potassium 4.3 3.5 - 5.1 mmol/L    Chloride 113 (H) 98 - 111 mmol/L    CO2 19 (L) 22 - 32 mmol/L    Glucose, Bld 93 70 - 99 mg/dL      Comment: Glucose reference range applies only to samples taken after fasting for at least 8 hours.    BUN 15 8 - 23 mg/dL    Creatinine, Ser 1.50 (H) 0.44 - 1.00 mg/dL    Calcium 8.7 (L) 8.9 - 10.3 mg/dL    GFR, Estimated 35 (L) >60 mL/min      Comment: (NOTE) Calculated using the CKD-EPI Creatinine Equation (2021)      Anion gap 8 5 - 15      Comment: Performed at [facility] [address] [place], [place] [address]  CBC with Differential/Platelet     Status: Abnormal    Collection Time: 04/04/22  2:41 AM  Result Value Ref  Range    WBC 10.9 (H) 4.0 - 10.5 K/uL    RBC 2.99 (L) 3.87 - 5.11 MIL/uL    Hemoglobin 8.6 (L) 12.0 - 15.0 g/dL    HCT 26.3 (L) 36.0 - 46.0 %    MCV 88.0 80.0 - 100.0 fL    MCH 28.8 26.0 - 34.0 pg    MCHC 32.7 30.0 - 36.0 g/dL    RDW 15.9 (H) 11.5 - 15.5 %    Platelets PLATELET CLUMPS NOTED ON SMEAR, UNABLE TO ESTIMATE 150 - 400 K/uL  nRBC 0.0 0.0 - 0.2 %    Neutrophils Relative % 64 %    Neutro Abs 7.0 1.7 - 7.7 K/uL    Lymphocytes Relative 17 %    Lymphs Abs 1.9 0.7 - 4.0 K/uL    Monocytes Relative 9 %    Monocytes Absolute 1.0 0.1 - 1.0 K/uL    Eosinophils Relative 8 %    Eosinophils Absolute 0.9 (H) 0.0 - 0.5 K/uL    Basophils Relative 1 %    Basophils Absolute 0.1 0.0 - 0.1 K/uL    Immature Granulocytes 1 %    Abs Immature Granulocytes 0.15 (H) 0.00 - 0.07 K/uL      Comment: Performed at [facility] [address], [place], [place] [address]  Glucose, capillary     Status: None    Collection Time: 04/04/22  6:14 AM  Result Value Ref Range    Glucose-Capillary 82 70 - 99 mg/dL      Comment: Glucose reference range applies only to samples taken after fasting for at least 8 hours.    Comment 1 Notify RN      Comment 2 Document in Chart    Basic metabolic panel     Status: Abnormal    Collection Time: 04/05/22  4:16 AM  Result Value Ref Range    Sodium 140 135 - 145 mmol/L    Potassium 3.9 3.5 - 5.1 mmol/L    Chloride 113 (H) 98 - 111 mmol/L    CO2 18 (L) 22 - 32 mmol/L    Glucose, Bld 98 70 - 99 mg/dL      Comment: Glucose reference range applies only to samples taken after fasting for at least 8 hours.    BUN 10 8 - 23 mg/dL    Creatinine, Ser 1.30 (H) 0.44 - 1.00 mg/dL    Calcium 8.6 (L) 8.9 - 10.3 mg/dL    GFR, Estimated 42 (L) >60 mL/min      Comment: (NOTE) Calculated using the CKD-EPI Creatinine Equation (2021)      Anion gap 9 5 - 15      Comment: Performed at [facility] [address], [place], [place] [address]  CBC with  Differential/Platelet     Status: Abnormal    Collection Time: 04/05/22  4:16 AM  Result Value Ref Range    WBC 9.9 4.0 - 10.5 K/uL    RBC 2.95 (L) 3.87 - 5.11 MIL/uL    Hemoglobin 8.6 (L) 12.0 - 15.0 g/dL    HCT 25.8 (L) 36.0 - 46.0 %    MCV 87.5 80.0 - 100.0 fL    MCH 29.2 26.0 - 34.0 pg    MCHC 33.3 30.0 - 36.0 g/dL    RDW 15.3 11.5 - 15.5 %    Platelets 342 150 - 400 K/uL    nRBC 0.0 0.0 - 0.2 %    Neutrophils Relative % 68 %    Neutro Abs 6.9 1.7 - 7.7 K/uL    Lymphocytes Relative 14 %    Lymphs Abs 1.3 0.7 - 4.0 K/uL    Monocytes Relative 8 %    Monocytes Absolute 0.8 0.1 - 1.0 K/uL    Eosinophils Relative 8 %    Eosinophils Absolute 0.7 (H) 0.0 - 0.5 K/uL    Basophils Relative 0 %    Basophils Absolute 0.0 0.0 - 0.1 K/uL    Immature Granulocytes 2 %    Abs Immature Granulocytes 0.15 (H) 0.00 -  0.07 K/uL      Comment: Performed at [facility], [place] [address], [place], [place] [address]       Imaging Results (Last 48 hours)  DG Abd Portable 1V   Result Date: 04/04/2022 CLINICAL DATA:  Abdominal distension. EXAM: PORTABLE ABDOMEN - 1 VIEW COMPARISON:  07/04/2017.  CT, 08/04/2017. FINDINGS: Normal bowel gas pattern. Bilateral iliac vascular stents, stable. Previous cholecystectomy. No evidence of renal or ureteral stones. Bilateral total hip arthroplasties, incompletely imaged, grossly unchanged. No acute skeletal abnormality. IMPRESSION: 1. No acute findings. No bowel dilation to account for abdominal distension. Electronically Signed   By: Lajean Manes M.D.   On: 04/04/2022 13:20           Blood pressure (!) 203/76, pulse 75, temperature 98 F (36.7 C), temperature source Oral, resp. rate 18, height 5\' 1"  (1.549 m), weight 74.1 kg, SpO2 100 %.   Medical Problem List and Plan: 1. Functional deficits secondary to debility secondary to peripheral vascular disease with debilitating claudication.  Status post bilateral endarterectomies with stent placement 03/17/2022  per Dr. Leotis Pain complicated by right groin wound infection status post right femoral exposure washout debridement redo patch plasty with ipsilateral GSV 03/30/2022.  Wound VAC is indicated x7 days through 04/06/2022             -patient may not shower             -ELOS/Goals: 7-10d 2.  Antithrombotics: -DVT/anticoagulation:  Mechanical: Antiembolism stockings, thigh (TED hose) Bilateral lower extremities             -antiplatelet therapy: Aspirin 81 mg daily and Plavix 75 mg daily 3. Pain Management: Oxycodone as needed, pt hesistent to take meds for pain  4. Mood: Pamelor 10 mg twice daily.  Provide emotional support             -antipsychotic agents: N/A 5. Neuropsych: This patient is capable of making decisions on her own behalf. 6. Skin/Wound Care: Routine skin checks 7. Fluids/Electrolytes/Nutrition: Routine in and outs with follow-up chemistries 8.  ID.  Currently maintained on Zosyn for right groin wound infection x6 weeks.  Right groin wound culture rare Pseudomonas aeruginosa.  9.  Postoperative cardiopulmonary arrest/non-STEMI with history of CAD/CABG.  Patient did receive CPR x2 minutes.  Follow-up cardiology service.  Continue Ranexa 500 mg twice daily.  Plan outpatient follow-up cardiology services 10.  Acute on chronic anemia.  Continue iron supplement.  Follow-up CBC 11.  CKD stage III.  Baseline creatinine 1.42-1.56.  Latest creatinine 2.11 follow-up chemistries 12.  Diastolic congestive heart failure.  Monitor for any signs of fluid overload 13.  Hypertension.  Normodyne 300 mg twice daily,Norvasc 10 mg daily just started 04/05/22, SBP worsened by pain.Enc pain med use.  Monitor with increased mobility 14.  Hyperlipidemia.  Zetia 10 mg daily.       Lavon Paganini [place], PA-C 04/05/2022  "I have personally performed a face to face diagnostic evaluation of this patient.  Additionally, I have reviewed and concur with the physician assistant's documentation above." Charlett Blake  M.D. [facility] Fellow Am [employer] Diplomate Am Board of Electrodiagnostic Med Fellow Am Board of Interventional Pain

## 2022-04-05 NOTE — Progress Notes (Signed)
PROGRESS NOTE    Sabrina [name]  VCB:[id number] DOB: 1941-04-24 DOA: 03/29/2022 PCP: Crecencio Mc, MD   Brief Narrative: Sabrina [name] is a 81 y.o. female with a history of HTN, HLD, CAD (s/p 3v-CABG 2001), CAS/PVD (s/p L CEA 2004), abdominal aortic ectasia, mild AS, chronic diastolic CHF (Echo 12/8464 with EF > 55%, G1DD), pulmonary HTN, CKD stage III, PAD status post multiple revascularization procedures, cervical spondylosis. Patient presented from inpatient rehabilitation for malaise/drainage from wound secondary to post-op wound infection. Patient required antibiotics and return to the OR for I&D. Wound cultures significant for pseudomonas aeruginosa. Hospitalization complicated by AKI and acute anemia requiring blood transfusions.   Assessment and Plan:  Postoperative wound infection Vascular is following. Patient underwent I&D in OR on 6/5. Patient started empirically on Ceftriaxone and Flagyl. Antibiotics transitioned to Zosyn once culture was significant for pseudomonas species. Wound culture significant for pansensitive Pseudomonas aeruginosa.  -Continue Zosyn IV  Septic shock Secondary to wound infection. Patient required Neo-synephrine during surgery with subsequent ICU admission. Complicated by blood loss. Wound infection management as mentioned above.  Acute blood loss anemia Anemia of chronic disease Acute blood loss in setting of recent vascular surgery, but no obvious source of bleeding. Patient has received 3 units of PRBC to date. No GI bleeding noted. Discussed with Vascular surgery and no concern for post-operative bleeding/hematoma at this time. -CBC in AM  Primary hypertension Continues to be uncontrolled. -Continue home labetalol 300 mg BID and restart home amlodipine 10 mg daily -Hydralazine 10 mg IV PRN  AKI on CKD stage IIIb Baseline creatinine of about 1.5. Creatinine of 1.9 on admission with peak of 2.31. Improved back towards baseline. AKI  resolved.  Hyponatremia Resolved.  PAD Recent cardiac arrest/NSTEMI Bilateral femoral endarterectomies -Continue aspirin, Plavix, Ranexa, Zetia  Anxiety Depression -Continue nortriptyline   DVT prophylaxis: SCDs Code Status:   Code Status: Full Code Family Communication: Daughter and son-in-law at bedside Disposition Plan: Anticipate readiness for discharge to inpatient rehab on 6/13 if vascular agrees.   Consultants:  Vascular surgery PCCM  Procedures:  Right groin I&D (6/6)  Antimicrobials: Ceftriaxone Flagyl Zosyn    Subjective: Patient is feeling a little better today. No issues overnight. No chest pain, dyspnea or headache related to high blood pressure.  Objective: BP (!) 205/79 (BP Location: Left Arm)   Pulse 80   Temp 98.1 F (36.7 C) (Oral)   Resp 16   Ht 5\' 1"  (1.549 m)   Wt 74.1 kg   SpO2 99%   BMI 30.87 kg/m   Examination:  General exam: Appears calm and comfortable Respiratory system: Clear to auscultation. Respiratory effort normal. Cardiovascular system: S1 & S2 heard, RRR. No murmurs. Gastrointestinal system: Abdomen is nondistended, soft and nontender. Normal bowel sounds heard. Central nervous system: Alert and oriented. No focal neurological deficits. Musculoskeletal: No edema. No calf tenderness Psychiatry: Judgement and insight appear normal. Mood & affect appropriate.    Data Reviewed: I have personally reviewed following labs and imaging studies  CBC Lab Results  Component Value Date   WBC 9.9 04/05/2022   RBC 2.95 (L) 04/05/2022   HGB 8.6 (L) 04/05/2022   HCT 25.8 (L) 04/05/2022   MCV 87.5 04/05/2022   MCH 29.2 04/05/2022   PLT 342 04/05/2022   MCHC 33.3 04/05/2022   RDW 15.3 04/05/2022   LYMPHSABS 1.3 04/05/2022   MONOABS 0.8 04/05/2022   EOSABS 0.7 (H) 04/05/2022   BASOSABS 0.0 04/05/2022     Last  metabolic panel Lab Results  Component Value Date   NA 140 04/05/2022   K 3.9 04/05/2022   CL 113 (H)  04/05/2022   CO2 18 (L) 04/05/2022   BUN 10 04/05/2022   CREATININE 1.30 (H) 04/05/2022   GLUCOSE 98 04/05/2022   GFRNONAA 42 (L) 04/05/2022   GFRAA 50 (L) 03/10/2020   CALCIUM 8.6 (L) 04/05/2022   PHOS 3.6 03/31/2022   PROT 7.0 03/29/2022   ALBUMIN 3.0 (L) 03/29/2022   LABGLOB 3.3 03/27/2018   BILITOT 0.7 03/29/2022   ALKPHOS 75 03/29/2022   AST 18 03/29/2022   ALT 15 03/29/2022   ANIONGAP 9 04/05/2022    GFR: Estimated Creatinine Clearance: 31.8 mL/min (A) (by C-G formula based on SCr of 1.3 mg/dL (H)).  Recent Results (from the past 240 hour(s))  Culture, blood (single) w Reflex to ID Panel     Status: None   Collection Time: 03/29/22 12:34 PM   Specimen: BLOOD RIGHT HAND  Result Value Ref Range Status   Specimen Description BLOOD RIGHT HAND  Final   Special Requests   Final    BOTTLES DRAWN AEROBIC AND ANAEROBIC Blood Culture results may not be optimal due to an inadequate volume of blood received in culture bottles   Culture   Final    NO GROWTH 5 DAYS Performed at [facility], [place] [address], [place] , [place] [address]    Report Status 04/03/2022 FINAL  Final  Surgical PCR screen     Status: Abnormal   Collection Time: 03/30/22  4:00 AM   Specimen: Nasal Mucosa; Nasal Swab  Result Value Ref Range Status   MRSA, PCR NEGATIVE NEGATIVE Final   Staphylococcus aureus POSITIVE (A) NEGATIVE Final    Comment: (NOTE) The Xpert SA Assay (FDA approved for NASAL specimens in patients 49 years of age and older), is one component of a comprehensive surveillance program. It is not intended to diagnose infection nor to guide or monitor treatment. Performed at [facility], [place] [address] [place], [place] [address]   Aerobic/Anaerobic Culture w Gram Stain (surgical/deep wound)     Status: None   Collection Time: 03/30/22  8:49 AM   Specimen: PATH Cytology Misc. fluid; Body Fluid  Result Value Ref Range Status   Specimen Description TISSUE  Final   Special  Requests RIGHT GROIN  Final   Gram Stain   Final    FEW WBC PRESENT, PREDOMINANTLY PMN NO ORGANISMS SEEN    Culture   Final    RARE PSEUDOMONAS AERUGINOSA RESULT CALLED TO, READ BACK BY AND VERIFIED WITH: RN CATHY A [id number] MLM NO ANAEROBES ISOLATED Performed at [facility], [place] [address], [place], [place] [address]    Report Status 04/04/2022 FINAL  Final   Organism ID, Bacteria PSEUDOMONAS AERUGINOSA  Final      Susceptibility   Pseudomonas aeruginosa - MIC*    CEFTAZIDIME 4 SENSITIVE Sensitive     CIPROFLOXACIN 0.5 SENSITIVE Sensitive     GENTAMICIN <=1 SENSITIVE Sensitive     IMIPENEM 2 SENSITIVE Sensitive     PIP/TAZO 8 SENSITIVE Sensitive     CEFEPIME 2 SENSITIVE Sensitive     * RARE PSEUDOMONAS AERUGINOSA      Radiology Studies: DG Abd Portable 1V  Result Date: 04/04/2022 CLINICAL DATA:  Abdominal distension. EXAM: PORTABLE ABDOMEN - 1 VIEW COMPARISON:  07/04/2017.  CT, 08/04/2017. FINDINGS: Normal bowel gas pattern. Bilateral iliac vascular stents, stable. Previous cholecystectomy. No evidence of  renal or ureteral stones. Bilateral total hip arthroplasties, incompletely imaged, grossly unchanged. No acute skeletal abnormality. IMPRESSION: 1. No acute findings. No bowel dilation to account for abdominal distension. Electronically Signed   By: Lajean Manes M.D.   On: 04/04/2022 13:20      LOS: 7 days    Cordelia Poche, MD [facility] 04/05/2022, 9:51 AM   If 7PM-7AM, please contact night-coverage [email or web address]

## 2022-04-05 NOTE — Progress Notes (Signed)
Kirsteins, Luanna Salk, MD  Physician Physical Medicine and Rehabilitation PMR Pre-admission    Signed Date of Service:  03/31/2022  3:50 PM  Related encounter: Admission (Discharged) from 03/29/2022 in [facility] 4E CV [place]   Signed      PMR Admission Coordinator Pre-Admission Assessment   Patient: Sabrina [name] is an 81 y.o., female MRN: [id number] DOB: [birth year] Height: $RemoveBeforeDE'5\' 1"'DltxIvbJvWaIean$  (154.9 cm) Weight: 74.1 kg   Insurance Information HMO:     PPO:      PCP:      IPA:      80/20:      OTHER:  PRIMARY: Medicare a and b      Policy#: [id number]      Subscriber: pt Benefits:  Phone #: passport one source online     Name: 6/7 Eff. Date: 01/24/2007     Deduct: $1600      Out of Pocket Max: none      Life Max: none CIR: 100%       SNF: 20 full days Outpatient: 80%     Co-Pay: 20% Home Health: 100%      Co-Pay: none DME: 80%     Co-Pay: 20% Providers: pt choice  SECONDARY: BCBS supplement      Policy#: [id number]   Financial Counselor:       Phone#:    The "Data Collection Information Summary" for patients in Inpatient Rehabilitation Facilities with attached "Privacy Act [place] Records" was provided and verbally reviewed with: N/A   Emergency Contact Information Contact Information       Name Relation Home Work Mobile    [place] Daughter     [phone]    Dorita Fray [phone]   [phone]    [place], Row [phone]             Current Medical History  Patient Admitting Diagnosis: Debility   History of Present Illness:  81 year old right-handed female with history of obesity with BMI 30.53, chronic hip pain, chronic anemia, CAD/CABG x3 with LIMA-LAD, SVG-distal RCA and SVG-ramus intermedius 03/2000 with recent heart cath 11/18/2021 showing extensive native CAD and 100% occlusion SVG-RCA and 90%, distal SVG-OM with significant stenosis of her left subclavian status post left subclavian stenting 11/23/2021 maintained on  low-dose aspirin, diastolic congestive heart failure, carotid artery angioplasty 2004, CKD stage III, hypertension, hyperlipidemia, peripheral vascular disease with multiple interventions in the past.   Presented to [facility]/17/[facility]22 for elective bilateral superior femoral artery and left iliac stenting with vascular surgery due to peripheral vascular disease and debilitating claudication with some mild rest pain.  Underwent left common femoral, profunda femoris, and superficial femoral artery endarterectomies as well as right common femoral profunda femoris and superficial femoral artery enterectomy's stent placement to left external iliac artery with 8 mm diameter by 4 cm length of life star stent 03/17/2022 per Dr. Leotis Pain.    Postoperative acute respiratory failure cardiac arrest requiring CPR x2 minutes.  To have markedly elevated troponin of 15,000.  She did require intubation for airway protection and extubated same day.  Echocardiogram with ejection fraction of 60 to 65% no wall motion abnormalities.  She is currently maintained on aspirin 81 mg daily and Plavix 75 mg daily for at least 12 months and plan to discuss outpatient cardiac catheterization.  Ranexa ongoing at 500 mg twice daily per cardiology services Dr. Lujean Amel.  Ongoing wound care bilateral groin dressings with wound VACS removed 03/22/2022.   Acute on chronic anemia 8.3  and monitored she was transfused 1 unit packed red blood cells 5/27.  Admitted to CIR on 03/24/22.   On Cir she continued to have some increased drainage form the groin site. Vascular surgery at [facility] ws consulted and recommended to readmit her to acute hospital for further medical workup. Noted Leukocytosis. Dr Virl Cagey, completed I and D of the right groin site on 6/6 with reexposure orf the common femoral artery, and completed common femoral patch plasty using ipsilateral saphenous vein with Myriad Morcel placement and Wound Vac placed. Postoperatively placed in ICU.  Placed on Zosyn and await wound cultures. Plan Vac for 7 days and recommend 6 weeks of antibiotics.   Patient's medical record from [facility] has been reviewed by the rehabilitation admission coordinator and physician.   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 b.) TTE 11/16/2021: EF >55%; mild AS (MPG 9 mmHg)   CAD (coronary artery disease)      a.) s/p 3v CABG 03/29/2000   Carotid artery stenosis      a.) s/p LEFT CEA 09/09/2003. b.) Carotid doppler 03/49/1791: 5-05% LICA, CTO RICA; subclavian stenosis   Cataracts, bilateral     Cervical spondylosis without myelopathy     Chronic diastolic CHF (congestive heart failure), NYHA class 3 (HCC)      a.) TTE 05/27/2016: EF >55%, mild LA enlargement, triv PR, mild MR, mod TR; G3DD. b.) TTE 12/12/2017: EF >55%, mild LVH, BAE, mild MR/PR, mod TR; RVSP 52.8 mmHg. c.) TTE 03/18/2020: EF >55%, BAD, AS (MPG 8.7 mmHg); triv MR, mild TR/PR. d.) TTE 11/16/2021: EF >55%, LVH, G1DD, triv MR, mild PR, mod TR; AS (MPG 9 mmHg); MS (MPG 5 mmHg)   Chronic kidney disease, stage III (moderate) (HCC)     Chronic narcotic use 06/24/2014   Chronic pain syndrome     GERD (gastroesophageal reflux disease)     History of 2019 novel coronavirus disease (COVID-19) 09/22/2021   Hyperlipidemia     Hypertension     Long term current use of antithrombotics/antiplatelets      a.) on daily DAPT therapy (ASA + clopidogrel)   Lumbar stenosis with neurogenic claudication     Mitral stenosis 11/16/2021    a.) TTE 11/16/2021: EF >55%;  mod MS (MPG 5 mmHg)   Osteoarthritis of hip     Pulmonary hypertension ([place]) 12/12/2017    a.) TTE 12/12/2017: mild; RVSP 52.8 mmHg   PVD (peripheral vascular disease) (HCC)     Renal artery stenosis (HCC)     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    Has the patient had major surgery during 100 days prior to admission? Yes   Family History   family history includes Breast cancer (age of onset:  90) in her paternal aunt; Cerebral aneurysm in her son and son; Diabetes in her mother; Heart disease in her sister; Hypertension in her father and mother; Multiple sclerosis in her daughter and son; Seizures in her son; Stroke in her mother.   Current Medications   Current Facility-Administered Medications:    0.9 %  sodium chloride infusion, , Intravenous, PRN, Broadus John, MD, Last Rate: 10 mL/hr at 04/04/22 0800, Infusion Verify at 04/04/22 0800   acetaminophen (TYLENOL) tablet 325-650 mg, 325-650 mg, Oral, Q4H PRN, 650 mg at 03/31/22 1217 **OR** acetaminophen (TYLENOL) suppository 325-650 mg, 325-650 mg, Rectal, Q4H PRN, Theda Sers, Emma M, PA-C   alum & mag hydroxide-simeth (MAALOX/MYLANTA) 200-200-20 MG/5ML suspension 15-30 mL, 15-30 mL, Oral, Q2H PRN, Theda Sers, Emma M, PA-C   amLODipine (NORVASC) tablet 10 mg, 10 mg, Oral, Daily, Mariel Aloe, MD   aspirin chewable tablet 81 mg, 81 mg, Oral, Daily, Laurence Slate M, PA-C, 81 mg at 04/05/22 0981   Chlorhexidine Gluconate Cloth 2 % PADS 6 each, 6 each, Topical, Daily, Agarwala, Einar Grad, MD, 6 each at 04/04/22 2100   clopidogrel (PLAVIX) tablet 75 mg, 75 mg, Oral, Daily, Laurence Slate M, PA-C, 75 mg at 04/05/22 0854   docusate sodium (COLACE) capsule 100 mg, 100 mg, Oral, BID PRN, [place] Crane M, PA-C, 100 mg at 04/01/22  2039   ezetimibe (ZETIA) tablet 10 mg, 10 mg, Oral, Daily, Collins, Emma M, PA-C, 10 mg at 04/05/22 0854   ferrous sulfate tablet 325 mg, 325 mg, Oral, Q breakfast, Laurence Slate M, PA-C, 325 mg at 04/05/22 1914   hydrALAZINE (APRESOLINE) injection 10 mg, 10 mg, Intravenous, Q6H PRN, Mariel Aloe, MD   labetalol (NORMODYNE) tablet 300 mg, 300 mg, Oral, BID, Mariel Aloe, MD, 300 mg at 04/05/22 0854   nortriptyline (PAMELOR) capsule 10 mg, 10 mg, Oral, BID, Collins, Emma M, PA-C, 10 mg at 04/05/22 7829   oxyCODONE-acetaminophen (PERCOCET/ROXICET) 5-325 MG per tablet 1-2 tablet, 1-2 tablet, Oral, Q4H PRN, Laurence Slate M, PA-C, 2 tablet at 04/04/22 1825   piperacillin-tazobactam (ZOSYN) IVPB 3.375 g, 3.375 g, Intravenous, Q8H, Mariel Aloe, MD, Last Rate: 12.5 mL/hr at 04/05/22 0512, 3.375 g at 04/05/22 0512   polyethylene glycol (MIRALAX / GLYCOLAX) packet 17 g, 17 g, Oral, Daily PRN, Ayesha Rumpf, Stephanie M, PA-C   ranolazine (RANEXA) 12 hr tablet 500 mg, 500 mg, Oral, BID, Laurence Slate M, PA-C, 500 mg at 04/05/22 5621   Patients Current Diet:  Diet Order                  Diet Heart Room service appropriate? Yes; Fluid consistency: Thin  Diet effective now                       Precautions / Restrictions Precautions Precautions: Fall, Other (comment) Precaution Comments: R groin wound vac; urinary urgency/incontinence Restrictions Weight Bearing Restrictions: No    Has the patient had 2 or more falls or a fall with injury in the past year? No   Prior Activity Level Community (5-7x/wk): Mod I and driving   Prior Functional Level Self Care: Did the patient need help bathing, dressing, using the toilet or eating? Independent   Indoor Mobility: Did the patient need assistance with walking from room to room (with or without device)? Independent   Stairs: Did the patient need assistance with internal or external stairs (with or without device)? Independent   Functional  Cognition: Did the patient  need help planning regular tasks such as shopping or remembering to take medications? Independent   Patient Information Are you of Hispanic, Latino/a,or Spanish origin?: A. No, not of Hispanic, Latino/a, or Spanish origin What is your race?: B. Black or African American Do you need or want an interpreter to communicate with a doctor or health care staff?: 0. No   Patient's Response To:  Health Literacy and Transportation Is the patient able to respond to health literacy and transportation needs?: Yes Health Literacy - How often do you need to have someone help you when you read instructions, pamphlets, or other written material from your doctor or pharmacy?: Never In the past 12 months, has lack of transportation kept you from medical appointments or from getting medications?: No In the past 12 months, has lack of transportation kept you from meetings, work, or from getting things needed for daily living?: No   Home Assistive Devices / [place] Devices/Equipment: Bedside commode/3-in-1 Home Equipment: Conservation officer, nature (2 wheels), [employer] - single point   Prior Device Use: Indicate devices/aids used by the patient prior to current illness, exacerbation or injury? None of the above   Current Functional Level Cognition   Arousal/Alertness: Awake/alert Overall Cognitive Status: Within Functional Limits for tasks assessed Orientation Level: Oriented X4 General Comments: safety cues as needed    Extremity Assessment (includes Sensation/Coordination)   Upper Extremity Assessment: Generalized weakness  Lower Extremity Assessment: Generalized weakness RLE Deficits / Details: hip strength limited supine <3/5 secondary to pain     ADLs   Grooming: Wash/dry hands, Wash/dry face, Set up, Sitting Upper Body Bathing: Sitting, Minimal assistance Lower Body Bathing: Moderate assistance, Sit to/from stand Upper Body Dressing : Sitting, Minimal  assistance Upper Body Dressing Details (indicate cue type and reason): IV lines and wrap to R forearm Lower Body Dressing: Moderate assistance, Sit to/from stand Toilet Transfer: Minimal assistance, Stand-pivot, BSC/3in1     Mobility   Overal bed mobility: Needs Assistance Bed Mobility: Supine to Sit Supine to sit: Min assist General bed mobility comments: pt able to assist LLE to EOB with use of gait belt for AAROM, significant increased time and effort, still reliant on use of bed rail and modA to scoot L hip to EOB     Transfers   Overall transfer level: Needs assistance Equipment used: Rolling walker (2 wheels) Transfers: Sit to/from Stand Sit to Stand: Min assist Bed to/from chair/wheelchair/BSC transfer type:: Step pivot Step pivot transfers: Min assist General transfer comment: increased time and effort, multiple sit<>stands from EOB and recliner to RW, repeated cues for hand placement, minA for trunk elevation and stability; external assist to stabilize RW     Ambulation / Gait / Stairs / Wheelchair Mobility   Ambulation/Gait Ambulation/Gait assistance: Herbalist (Feet): 40 Feet Assistive device: Rolling walker (2 wheels) Gait Pattern/deviations: Step-to pattern, Decreased weight shift to left, Trunk flexed, Antalgic General Gait Details: slow, antalgic, guarded gait with RW and minA for stability and RW management; frequent cues to maintain closer proximity to RW and upright posture as pt frequently leaning R forearm against RW; pt requires encouragement to progress gait distance; intermittent standing rest breaks before seated rest secondary to pain and fatigue Gait velocity: decreased Gait velocity interpretation: <1.31 ft/sec, indicative of household ambulator     Posture / Balance Balance Overall balance assessment: Needs assistance Sitting-balance support: Feet supported, No upper extremity supported Sitting balance-Leahy Scale: Poor Postural control:  Right lateral lean, Posterior lean Standing balance support:  Reliant on assistive device for balance Standing balance-Leahy Scale: Fair Standing balance comment: pt can static stand with close min guard to perform pericare     Special needs/care consideration Surgical wound with Wound Vac Twice per week Likely need 6 weeks of antibiotics    Previous Home Environment  Living Arrangements: Alone  Lives With: Alone Available Help at Discharge: Family, Available 24 hours/day (daughter, [name], from [place] here  to [employer] as needed) Type of Home: House Home Layout: One level Home Access: Level entry Bathroom Shower/Tub: Tub/shower unit, Multimedia programmer: Associate Professor Accessibility: Yes How Accessible: Accessible via walker [place]: No Additional Comments: Patient stating Daughter and SIL will be staying with her for a few days after she returns from [place] for Discharge Living Setting: Patient's home, Alone Type of Home at Discharge: House Discharge Home Layout: One level Discharge Home Access: Level entry Discharge Bathroom Shower/Tub: Tub/shower unit, Walk-in shower Discharge Bathroom Toilet: Standard Discharge Bathroom Accessibility: Yes How Accessible: Accessible via walker Does the patient have any problems obtaining your medications?: No   Social/Family/Support Systems Patient Roles: Parent Contact Information: daughter, [name] Anticipated Caregiver: daughter [name] and SIL here from [place] and will asisst as needed Anticipated Caregiver's Contact Information: see contacts Ability/Limitations of Caregiver: Initally 24/7 and then prn when [name] returns to [place] Availability: 24/7 Discharge Plan Discussed with Primary Caregiver: Yes Is Caregiver In Agreement with Plan?: Yes Does Caregiver/Family have Issues with Lodging/Transportation while Pt is in Rehab?: No   Goals Patient/Family Goal for Rehab: Mod I  to intermittent supervision with PT and OT Expected length of stay: ELOS 7 to 10 days Pt/Family Agrees to Admission and willing to participate: Yes Program Orientation Provided & Reviewed with Pt/Caregiver Including Roles  & Responsibilities: Yes   Decrease burden of Care through IP rehab admission: n/a   Possible need for SNF placement upon discharge: not anticipated   Patient Condition: I have reviewed [facility] records from [facility], spoken with  patient, daughter, and family member. I met with patient at the bedside for inpatient rehabilitation assessment.  Patient will benefit from ongoing PT and OT, can actively participate in 3 hours of therapy a day 5 days of the week, and can make measurable gains during the admission.  Patient will also benefit from the coordinated team approach during an Inpatient Acute Rehabilitation admission.  The patient will receive intensive therapy as well as Rehabilitation physician, nursing, social worker, and care management interventions.  Due to bladder management, bowel management, safety, skin/wound care, disease management, medication administration, pain management, and patient education the patient requires 24 hour a day rehabilitation nursing.  The patient is currently min to mod assist overall with mobility and basic ADLs.  Discharge setting and therapy post discharge at home with home health is anticipated.  Patient has agreed to participate in the Acute Inpatient Rehabilitation Program and will admit today.   Preadmission Screen Completed By: Cleatrice Burke, RN MSN 04/05/2022 10:14 AM ______________________________________________________________________   Discussed status with Dr. Letta Pate on 04/05/2022 at 1014 and received approval for admission today.   Admission Coordinator:  Cleatrice Burke, RN MSN time  1014 Date 04/05/2022    Assessment/Plan: Diagnosis:Debility related to PAD Does the need for close, 24 hr/day Medical  supervision in concert with the patient's rehab needs make it unreasonable for this patient to be served in a less intensive setting? Yes Co-Morbidities requiring supervision/potential complications: Multiple LE revascularization procedures,  CAD, groin wound infection, post operative hemorrhage, uncontrolled pain and HTN Due to bladder management, bowel management, safety, skin/wound care, disease management, medication administration, pain management, and patient education, does the patient require 24 hr/day rehab nursing? Yes Does the patient require coordinated care of a physician, rehab nurse, PT, OT, and SLP to address physical and functional deficits in the context of the above medical diagnosis(es)? Yes Addressing deficits in the following areas: balance, endurance, locomotion, strength, transferring, bowel/bladder control, bathing, dressing, feeding, grooming, toileting, and psychosocial support Can the patient actively participate in an intensive therapy program of at least 3 hrs of therapy 5 days a week? Yes The potential for patient to make measurable gains while on inpatient rehab is good Anticipated functional outcomes upon discharge from inpatient rehab: modified independent and supervision PT, modified independent and supervision OT, n/a SLP Estimated rehab length of stay to reach the above functional goals is: 7-10d Anticipated discharge destination: Home 10. Overall Rehab/Functional Prognosis: excellent     MD Signature: Charlett Blake M.D. [employer] Fellow Am Acad of Phys Med and [place] Board of Electrodiagnostic Med Fellow Am Board of Interventional Pain           Revision History                                Note Details  Author Charlett Blake, MD File Time 04/05/2022 10:38 AM  Author Type Physician Status Signed  Last Editor Charlett Blake, MD Service Physical Medicine and [place] # [id number]  Admit Date 04/05/2022

## 2022-04-05 NOTE — Care Management Important Message (Signed)
Important Message  Patient Details  Name: Sabrina [name] MRN: [id number] Date of Birth: 1941-03-17   Medicare Important Message Given:  Yes     [name] 04/05/2022, 8:22 AM

## 2022-04-05 NOTE — Progress Notes (Signed)
Patient refused BIPAP for HS use. Patient states she does not wear at home. Patient stable on RA. RT will monitor as needed.

## 2022-04-05 NOTE — TOC Transition Note (Signed)
Transition of Care (TOC) - CM/SW Discharge Note Marvetta Gibbons RN, BSN Transitions of Care Unit 4E- RN Case Manager See Treatment Team for direct phone #    Patient Details  Name: [name] MRN: [id number] Date of Birth: [birth year]  Transition of Care [facility] CM/SW Contact:  Dawayne Patricia, RN Phone Number: 04/05/2022, 12:26 PM   Clinical Narrative:    Notified this am that [facility] rehab has bed available today, vascular has cleared pt to go back to rehab with wound VAC drsg changes Tues/Fri.   Per Pamala Hurry w/ [facility]- plan will be to transition pt back to [facility] rehab later today.    Final next level of care: IP Rehab Facility Barriers to Discharge: Barriers Resolved   Patient Goals and CMS Choice Patient states their goals for this hospitalization and ongoing recovery are:: return to rehab CMS Medicare.gov Compare Post Acute Care list provided to:: Patient Choice offered to / list presented to : NA  Discharge Placement               [facility] re[facility]        Discharge Plan and Services     Post Acute Care Choice: IP Rehab                               Social Determinants of Health (SDOH) Interventions     Readmission Risk Interventions    04/05/2022   12:25 PM  Readmission Risk Prevention Plan  Transportation Screening Complete  Home Care Screening Complete  Medication Review (RN CM) Complete

## 2022-04-06 DIAGNOSIS — R5381 Other malaise: Secondary | ICD-10-CM | POA: Diagnosis not present

## 2022-04-06 LAB — COMPREHENSIVE METABOLIC PANEL
ALT: 16 U/L (ref 0–44)
AST: 23 U/L (ref 15–41)
Albumin: 2.3 g/dL — ABNORMAL LOW (ref 3.5–5.0)
Alkaline Phosphatase: 74 U/L (ref 38–126)
Anion gap: 8 (ref 5–15)
BUN: 10 mg/dL (ref 8–23)
CO2: 22 mmol/L (ref 22–32)
Calcium: 8.7 mg/dL — ABNORMAL LOW (ref 8.9–10.3)
Chloride: 107 mmol/L (ref 98–111)
Creatinine, Ser: 1.32 mg/dL — ABNORMAL HIGH (ref 0.44–1.00)
GFR, Estimated: 41 mL/min — ABNORMAL LOW (ref >60)
Glucose, Bld: 97 mg/dL (ref 70–99)
Potassium: 3.7 mmol/L (ref 3.5–5.1)
Sodium: 137 mmol/L (ref 135–145)
Total Bilirubin: 1 mg/dL (ref 0.3–1.2)
Total Protein: 5.7 g/dL — ABNORMAL LOW (ref 6.5–8.1)

## 2022-04-06 LAB — CBC WITH DIFFERENTIAL/PLATELET
Abs Immature Granulocytes: 0.07 10*3/uL (ref 0.00–0.07)
Basophils Absolute: 0 10*3/uL (ref 0.0–0.1)
Basophils Relative: 1 %
Eosinophils Absolute: 0.7 10*3/uL — ABNORMAL HIGH (ref 0.0–0.5)
Eosinophils Relative: 8 %
HCT: 27.2 % — ABNORMAL LOW (ref 36.0–46.0)
Hemoglobin: 8.9 g/dL — ABNORMAL LOW (ref 12.0–15.0)
Immature Granulocytes: 1 %
Lymphocytes Relative: 17 %
Lymphs Abs: 1.5 10*3/uL (ref 0.7–4.0)
MCH: 28.9 pg (ref 26.0–34.0)
MCHC: 32.7 g/dL (ref 30.0–36.0)
MCV: 88.3 fL (ref 80.0–100.0)
Monocytes Absolute: 0.8 10*3/uL (ref 0.1–1.0)
Monocytes Relative: 10 %
Neutro Abs: 5.6 10*3/uL (ref 1.7–7.7)
Neutrophils Relative %: 63 %
Platelets: 381 10*3/uL (ref 150–400)
RBC: 3.08 MIL/uL — ABNORMAL LOW (ref 3.87–5.11)
RDW: 15.4 % (ref 11.5–15.5)
WBC: 8.7 10*3/uL (ref 4.0–10.5)
nRBC: 0 % (ref 0.0–0.2)

## 2022-04-06 MED ORDER — MORPHINE SULFATE (PF) 2 MG/ML IV SOLN
2.0000 mg | Freq: Every day | INTRAVENOUS | Status: DC | PRN
  Administered 2022-04-08 – 2022-04-12 (×2): 2 mg via INTRAVENOUS
  Filled 2022-04-06 (×2): qty 1

## 2022-04-06 MED ORDER — MORPHINE SULFATE (PF) 2 MG/ML IV SOLN
1.0000 mg | Freq: Every day | INTRAVENOUS | Status: DC | PRN
  Administered 2022-04-06: 1 mg via INTRAVENOUS
  Filled 2022-04-06: qty 1

## 2022-04-06 NOTE — Patient Care Conference (Signed)
Inpatient RehabilitationTeam Conference and Plan of Care Update Date: 04/06/2022   Time: 11:27 AM    Patient Name: Sabrina [name]      Medical Record Number: [id number]  Date of Birth: 03/06/1941 Sex: Female         Room/Bed: 4M10C/4M10C-01 Payor Info: Payor: MEDICARE / Plan: MEDICARE PART A AND B / Product Type: *No Product type* /    Admit Date/Time:  04/05/2022  3:09 PM  Primary Diagnosis:  [facility] Problems: Principal Problem:   Debility    Expected Discharge Date: Expected Discharge Date: 04/16/22  Team Members Present: Physician leading conference: Dr. Courtney Heys Social Worker Present: Loralee Pacas, [place] Nurse Present: Dorthula Nettles, RN PT Present: Excell Seltzer, PT OT Present: Roanna Epley, [place], OT SLP Present: Lillie Columbia, SLP PPS Coordinator present : Gunnar Fusi, SLP     Current Status/Progress Goal Weekly Team Focus  Bowel/Bladder   continent b/b  remain continent  toilet as needed   Swallow/Nutrition/ Hydration             ADL's   evals pending  evals pending  evals pending   Mobility   PT eval pending  PT eval pending  PT eval pending   Communication             Safety/Cognition/ Behavioral Observations            Pain   reports pain  < 3  assess pain q 4 hr and prn   Skin   wound vac to groin  no new breakdown  assess skin q shift and prn     Discharge Planning:  D/C to home with initial 24/7 care support from daughter and her husband. Periods of intermittent support when daughter has to travel back to [place] to get medication for MS. Daughter confirms she will be there to support her mother. Patient reports she has PRN support from friends/family.   Team Discussion: Wound vac changed today. Wound vac to be in place for minimum of 3 weeks. Will need PICC placement. IV abx for 6 weeks. Continent B/B, reports pain. Daughter to assist at discharge.  Patient on target to meet rehab goals: yes, mod I PT  goals, OT pending evals. Currently CGA 150 ft.  *See Care Plan and progress notes for long and short-term goals.   Revisions to Treatment Plan:  Adjusting medications, evals pending   Teaching Needs: Family education, medication/pain management, skin/wound care, transfer/gait training, etc.   Current Barriers to Discharge: Decreased caregiver support, IV antibiotics, Wound care, Lack of/limited family support, and Weight bearing restrictions  Possible Resolutions to Barriers: Family education Follow-up PT/OT Order recommended DME     Medical Summary Current Status: on IV ABX due to R groin infection and WOUND VAC x 3-4 more weeks  Barriers to Discharge: Home enviroment access/layout;IV antibiotics;Medical stability;Weight;Weight bearing restrictions;Wound care  Barriers to Discharge Comments: daughter to help- goals mod I however will need to go home on IV ABX AND wound VAC Possible Resolutions to [employer] Focus: walked 150 ft this AM- Supervision to CGA_ mod I goals- OT to eval still; d/c- 04/16/22   Continued Need for Acute Rehabilitation Level of Care: The patient requires daily medical management by a physician with specialized training in physical medicine and rehabilitation for the following reasons: Direction of a multidisciplinary physical rehabilitation program to maximize functional independence : Yes Medical management of patient stability for increased activity during participation in an intensive rehabilitation regime.: Yes Analysis of  laboratory values and/or radiology reports with any subsequent need for medication adjustment and/or medical intervention. : Yes   I attest that I was present, lead the team conference, and concur with the assessment and plan of the team.   Cristi Loron 04/06/2022, 3:34 PM

## 2022-04-06 NOTE — Progress Notes (Signed)
PROGRESS NOTE   Subjective/Complaints:  Pt reports wound VAC change due today-  Doing pretty well- but scared about VAC change due to severe pain with it- - LBM yesterday Will order IV morphine for now with VAC changes.    ROS:  Pt denies SOB, abd pain, CP, N/V/C/D, and vision changes   Objective:   [place] EKG SITE RITE  Result Date: 04/05/2022 If Site Rite image not attached, placement could not be confirmed due to current cardiac rhythm.  DG Abd Portable 1V  Result Date: 04/04/2022 CLINICAL DATA:  Abdominal distension. EXAM: PORTABLE ABDOMEN - 1 VIEW COMPARISON:  07/04/2017.  CT, 08/04/2017. FINDINGS: Normal bowel gas pattern. Bilateral iliac vascular stents, stable. Previous cholecystectomy. No evidence of renal or ureteral stones. Bilateral total hip arthroplasties, incompletely imaged, grossly unchanged. No acute skeletal abnormality. IMPRESSION: 1. No acute findings. No bowel dilation to account for abdominal distension. Electronically Signed   By: Lajean Manes M.D.   On: 04/04/2022 13:20   Recent Labs    04/05/22 0416 04/06/22 0508  WBC 9.9 8.7  HGB 8.6* 8.9*  HCT 25.8* 27.2*  PLT 342 381   Recent Labs    04/05/22 0416 04/06/22 0508  NA 140 137  K 3.9 3.7  CL 113* 107  CO2 18* 22  GLUCOSE 98 97  BUN 10 10  CREATININE 1.30* 1.32*  CALCIUM 8.6* 8.7*    Intake/Output Summary (Last 24 hours) at 04/06/2022 0844 Last data filed at 04/05/2022 1805 Gross per 24 hour  Intake 0 ml  Output --  Net 0 ml        Physical Exam: Vital Signs Blood pressure (!) 188/70, pulse 82, temperature 98.4 F (36.9 C), resp. rate 20, height 5' (1.524 m), weight 74.2 kg, SpO2 99 %.   General: awake, alert, appropriate; supine in bed; , NAD HENT: conjugate gaze; oropharynx moist CV: regular rate; no JVD Pulmonary: CTA B/L; no W/R/R- good air movement GI: soft, NT, ND, (+)BS- slightly hypoactive Psychiatric:  appropriate Neurological: Ox3 Neurologic: Cranial nerves II through XII intact, motor strength is 5/5 in bilateral deltoid, bicep, tricep, grip, 3- Bilateral hip flexor, knee extensors, 4/5 ankle dorsiflexor and plantar flexor Sensory exam normal sensation to light touch and proprioception in bilateral upper and lower extremities Cerebellar exam normal finger to nose to finger as well as heel to shin in bilateral upper and lower extremities Musculoskeletal: pain with B Hip ROM, no UE swelling or joint deformity, Neg knee or ankle effusion , mild tenderness right thigh Feet , s/p Right 5th toe amp well healed.  No other deformity , no skin breakdown  Skin- VAC on R groin- no redness seen  Assessment/Plan: 1. Functional deficits which require 3+ hours per day of interdisciplinary therapy in a comprehensive inpatient rehab setting. Physiatrist is providing close team supervision and 24 hour management of active medical problems listed below. Physiatrist and rehab team continue to assess barriers to discharge/monitor patient progress toward functional and medical goals  Care Tool:  Bathing              Bathing assist       Upper Body Dressing/Undressing Upper body dressing  Upper body assist      Lower Body Dressing/Undressing Lower body dressing            Lower body assist       Toileting Toileting    Toileting assist       Transfers Chair/bed transfer  Transfers assist           Locomotion Ambulation   Ambulation assist              Walk 10 feet activity   Assist           Walk 50 feet activity   Assist           Walk 150 feet activity   Assist           Walk 10 feet on uneven surface  activity   Assist           Wheelchair     Assist               Wheelchair 50 feet with 2 turns activity    Assist            Wheelchair 150 feet activity     Assist          Blood pressure  (!) 188/70, pulse 82, temperature 98.4 F (36.9 C), resp. rate 20, height 5' (1.524 m), weight 74.2 kg, SpO2 99 %.  Medical Problem List and Plan: 1. Functional deficits secondary to debility secondary to peripheral vascular disease with debilitating claudication.  Status post bilateral endarterectomies with stent placement 03/17/2022 per Dr. Leotis Pain complicated by right groin wound infection status post right femoral exposure washout debridement redo patch plasty with ipsilateral GSV 03/30/2022.  Wound VAC is indicated x7 days through 04/06/2022             -patient may not shower due to [facility]             -ELOS/Goals: 7-10d  First day of evaluations- con't PT and OT- team conference today to determine length of stay 2.  Antithrombotics: -DVT/anticoagulation:  Mechanical: Antiembolism stockings, thigh (TED hose) Bilateral lower extremities             -antiplatelet therapy: Aspirin 81 mg daily and Plavix 75 mg daily 3. Pain Management: Oxycodone as needed, pt hesistent to take meds for pain   6/13- add IV morphine 1 mg for VAC changes 4. Mood: Pamelor 10 mg twice daily.  Provide emotional support             -antipsychotic agents: N/A 5. Neuropsych: This patient is capable of making decisions on her own behalf. 6. Skin/Wound Care: Routine skin checks 7. Fluids/Electrolytes/Nutrition: Routine in and outs with follow-up chemistries 8.  ID.  Currently maintained on Zosyn for right groin wound infection x6 weeks.  Right groin wound culture rare Pseudomonas aeruginosa.   6/13- will need PICC- order was placed- verify placed 9.  Postoperative cardiopulmonary arrest/non-STEMI with history of CAD/CABG.  Patient did receive CPR x2 minutes.  Follow-up cardiology service.  Continue Ranexa 500 mg twice daily.  Plan outpatient follow-up cardiology services 10.  Acute on chronic anemia.  Continue iron supplement.  Follow-up CBC 11.  CKD stage III.  Baseline creatinine 1.42-1.56.  Latest creatinine 2.11 follow-up  chemistries  6/13- Cr 1.32- doing well and BUN 10- con't to monitor 12.  Diastolic congestive heart failure.  Monitor for any signs of fluid overload 13.  Hypertension.  Normodyne 300 mg twice daily,Norvasc 10 mg daily  just started 04/05/22, SBP worsened by pain.Enc pain med use.  Monitor with increased mobility  6/13- just added Norvasc back yesterday- 283 systolic this AM, but just got Am meds- monitor closely and titrate meds as required 14.  Hyperlipidemia.  Zetia 10 mg daily.    I spent a total of 37   minutes on total care today- >50% coordination of care- due to team conference and d/w nursing about VAC changes. Also d/w PA about BP   LOS: 1 days A FACE TO FACE EVALUATION WAS PERFORMED  Sabrina [name] 04/06/2022, 8:44 AM

## 2022-04-06 NOTE — Consult Note (Signed)
WOC Nurse Consult Note: Reason for Consult: Routine NPWT dressing change to right groin. Dr. Virl Cagey and Dr. Clair Gulling at bedside.  Wound type: surgical Pressure Injury POA: N/A Measurement: 7cm x 4cm x 1.6cm Wound bed: Irregular contours, red, no bleeding Drainage (amount, consistency, odor) serosanguinous in old cannister, I will plan to change cannister on Friday. Periwound: intact, moist, soft Dressing procedure/placement/frequency: Patient premedicated with IC morphine prior to dressing change. Reported nausea and headache after. Drape removed using adhesive remover spray. ONE (1) piece of black foam removed, skin cleansed.  Circular piece of skin barrier ring used to protect wound periphery and enhance seal. One (1) piece of black foam used to obliterate dead space. This is covered with drape and dressing attached to 184mmHg continuous negative pressure. An immediate seal is achieved. Patient is less anxious and is conversive with this Probation officer and family before end of encounter.  Next dressing change is scheduled for Friday, 6/16.  Supplies in room for this and next Tuesday's dressing changes.  Dr. Virl Cagey would like to be present for next Tuesday's dressing change. [facility] nursing is invited to contact Dr. Virl Cagey for any wound related concerns in the interim.  [facility] nursing team will follow, and will remain available to this patient, the nursing and medical teams.    Thank you for inviting [place] to participate in this patient's Plan of Care.  Maudie Flakes, MSN, RN, CNS, [place], Serita Grammes, [employer], [employer] phone:  [phone]

## 2022-04-06 NOTE — Progress Notes (Signed)
Inpatient Rehabilitation  Patient information reviewed and entered into eRehab system by Stetson Pelaez M. Steed Kanaan, M.A., CCC/SLP, PPS Coordinator.  Information including medical coding, functional ability and quality indicators will be reviewed and updated through discharge.    

## 2022-04-06 NOTE — Progress Notes (Signed)
Spoke with Primary RN re: PICC placement, will not be done today. No d/c date yet. Patient has 1 working PIV. Will follow up tomorrow.

## 2022-04-06 NOTE — Plan of Care (Signed)
  Problem: Sit to Stand Goal: LTG:  Patient will perform sit to stand in prep for activites of daily living with assistance level (OT) Description: LTG:  Patient will perform sit to stand in prep for activites of daily living with assistance level (OT) Flowsheets (Taken 04/06/2022 1559) LTG: PT will perform sit to stand in prep for activites of daily living with assistance level: Independent with assistive device   Problem: RH Bathing Goal: LTG Patient will bathe all body parts with assist levels (OT) Description: LTG: Patient will bathe all body parts with assist levels (OT) Flowsheets (Taken 04/06/2022 1559) LTG: Pt will perform bathing with assistance level/cueing: Independent with assistive device  LTG: Position pt will perform bathing:  Shower  At sink   Problem: RH Dressing Goal: LTG Patient will perform upper body dressing (OT) Description: LTG Patient will perform upper body dressing with assist, with/without cues (OT). Flowsheets (Taken 04/06/2022 1559) LTG: Pt will perform upper body dressing with assistance level of: Independent with assistive device Goal: LTG Patient will perform lower body dressing w/assist (OT) Description: LTG: Patient will perform lower body dressing with assist, with/without cues in positioning using equipment (OT) Flowsheets (Taken 04/06/2022 1559) LTG: Pt will perform lower body dressing with assistance level of: Independent with assistive device Note: With AE if needed   Problem: RH Toileting Goal: LTG Patient will perform toileting task (3/3 steps) with assistance level (OT) Description: LTG: Patient will perform toileting task (3/3 steps) with assistance level (OT)  Flowsheets (Taken 04/06/2022 1559) LTG: Pt will perform toileting task (3/3 steps) with assistance level: Independent with assistive device   Problem: RH Toilet Transfers Goal: LTG Patient will perform toilet transfers w/assist (OT) Description: LTG: Patient will perform toilet transfers  with assist, with/without cues using equipment (OT) Flowsheets (Taken 04/06/2022 1559) LTG: Pt will perform toilet transfers with assistance level of: Independent with assistive device   Problem: RH Tub/Shower Transfers Goal: LTG Patient will perform tub/shower transfers w/assist (OT) Description: LTG: Patient will perform tub/shower transfers with assist, with/without cues using equipment (OT) Flowsheets (Taken 04/06/2022 1559) LTG: Pt will perform tub/shower stall transfers with assistance level of: Supervision/Verbal cueing LTG: Pt will perform tub/shower transfers from:  Walk in shower  Tub/shower combination

## 2022-04-06 NOTE — Plan of Care (Signed)
  Problem: Sit to Stand Goal: LTG:  Patient will perform sit to stand with assistance level (PT) Description: LTG:  Patient will perform sit to stand with assistance level (PT) Flowsheets (Taken 04/06/2022 1229) LTG: PT will perform sit to stand in preparation for functional mobility with assistance level: Independent with assistive device   Problem: RH Bed Mobility Goal: LTG Patient will perform bed mobility with assist (PT) Description: LTG: Patient will perform bed mobility with assistance, with/without cues (PT). Flowsheets (Taken 04/06/2022 1229) LTG: Pt will perform bed mobility with assistance level of: Independent with assistive device    Problem: RH Bed to Chair Transfers Goal: LTG Patient will perform bed/chair transfers w/assist (PT) Description: LTG: Patient will perform bed to chair transfers with assistance (PT). Flowsheets (Taken 04/06/2022 1229) LTG: Pt will perform Bed to Chair Transfers with assistance level: Independent with assistive device    Problem: RH Car Transfers Goal: LTG Patient will perform car transfers with assist (PT) Description: LTG: Patient will perform car transfers with assistance (PT). Flowsheets (Taken 04/06/2022 1229) LTG: Pt will perform car transfers with assist:: Minimal Assistance - Patient > 75%   Problem: RH Ambulation Goal: LTG Patient will ambulate in controlled environment (PT) Description: LTG: Patient will ambulate in a controlled environment, # of feet with assistance (PT). Flowsheets (Taken 04/06/2022 1229) LTG: Pt will ambulate in controlled environ  assist needed:: Independent with assistive device LTG: Ambulation distance in controlled environment: 150 ft with LRAD Goal: LTG Patient will ambulate in home environment (PT) Description: LTG: Patient will ambulate in home environment, # of feet with assistance (PT). Flowsheets (Taken 04/06/2022 1229) LTG: Pt will ambulate in home environ  assist needed:: Independent with assistive  device LTG: Ambulation distance in home environment: 75 ft with LRAD

## 2022-04-06 NOTE — Evaluation (Signed)
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 b.) TTE 11/16/2021: EF >55%; mild AS (MPG 9 mmHg)   CAD (coronary artery disease)    a.) s/p 3v CABG 03/29/2000   Carotid artery stenosis    a.) s/p LEFT CEA 09/09/2003. b.) Carotid doppler 40/98/1191: 4-78% LICA, CTO RICA; subclavian stenosis   Cataracts, bilateral    Cervical spondylosis without myelopathy    Chronic diastolic CHF (congestive heart failure), NYHA class 3 (HCC)    a.) TTE 05/27/2016: EF >55%, mild LA enlargement, triv PR, mild MR, mod TR; G3DD. b.) TTE 12/12/2017: EF >55%, mild LVH, BAE, mild MR/PR, mod TR; RVSP 52.8 mmHg. c.) TTE 03/18/2020: EF >55%, BAD, AS (MPG 8.7 mmHg); triv MR, mild TR/PR. d.) TTE 11/16/2021: EF >55%, LVH, G1DD, triv MR, mild PR, mod TR; AS (MPG 9 mmHg); MS (MPG 5 mmHg)   Chronic kidney disease, stage III (moderate) (HCC)    Chronic narcotic use 06/24/2014   Chronic pain syndrome    GERD (gastroesophageal reflux disease)    History of 2019 novel coronavirus disease (COVID-19) 09/22/2021   Hyperlipidemia    Hypertension    Long term current use of  antithrombotics/antiplatelets    a.) on daily DAPT therapy (ASA + clopidogrel)   Lumbar stenosis with neurogenic claudication    Mitral stenosis 11/16/2021   a.) TTE 11/16/2021: EF >55%; mod MS (MPG 5 mmHg)   Osteoarthritis of hip    Pulmonary hypertension ([place]) 12/12/2017   a.) TTE 12/12/2017: mild; RVSP 52.8 mmHg   PVD (peripheral vascular disease) (HCC)    Renal artery stenosis (HCC)    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   Past Surgical History:  Past Surgical History:  Procedure Laterality Date   ABDOMINAL HYSTERECTOMY  1976   CAROTID ARTERY ANGIOPLASTY Left    CAROTID ENDARTERECTOMY Left 09/09/2003   Procedure: CAROTID ENDARTERECTOMY; Location: [facility]; Surgeon: Maura Crandall, MD   COLONOSCOPY WITH PROPOFOL N/A 08/16/2017   Procedure: COLONOSCOPY WITH PROPOFOL;  Surgeon: [name], MD;  Location: [facility];  Service: Endoscopy;  Laterality: N/A;   CORONARY ANGIOPLASTY WITH STENT PLACEMENT  2000   CORONARY ARTERY BYPASS GRAFT N/A 03/29/2000   Procedure: 3v CORONARY ARTERY BYPASS GRAFT; Location: [facility]   CYSTOSCOPY WITH STENT PLACEMENT Bilateral    ENDARTERECTOMY FEMORAL Bilateral 03/17/2022   Procedure: ENDARTERECTOMY FEMORAL ( BILATERAL SFA STENT);  Surgeon: Katha Cabal, MD;  Location: [facility];  Service:  Vascular;  Laterality: Bilateral;   ENDARTERECTOMY FEMORAL Right 03/30/2022   Procedure: RE-EXPOSURE OF RIGHT COMMON FEMORAL ARTERY, RE-DO RIGHT FEMORAL ENDARTERECTOMY WITH VEIN PATCH;  Surgeon: Broadus John, MD;  Location: [facility] OR;  Service: Vascular;  Laterality: Right;  WOUND VAC   ESOPHAGOGASTRODUODENOSCOPY (EGD) WITH PROPOFOL N/A 08/16/2017   Procedure: ESOPHAGOGASTRODUODENOSCOPY (EGD) WITH PROPOFOL;  Surgeon: [name], MD;  Location: [facility]  ENDOSCOPY;  Service: Endoscopy;  Laterality: N/A;   ESOPHAGOGASTRODUODENOSCOPY (EGD) WITH PROPOFOL N/A 06/29/2018   Procedure: ESOPHAGOGASTRODUODENOSCOPY (EGD) WITH PROPOFOL;  Surgeon: Virgel Manifold, MD;  Location: [facility];  Service: Endoscopy;  Laterality: N/A;   GROIN DEBRIDEMENT Right 03/30/2022   Procedure: [place];  Surgeon: Broadus John, MD;  Location: [place];  Service: Vascular;  Laterality: Right;   INSERTION OF ILIAC STENT Left 03/17/2022   Procedure: INSERTION OF ILIAC STENT;  Surgeon: Katha Cabal, MD;  Location: [facility];  Service: Vascular;  Laterality: Left;   LAPAROSCOPIC CHOLECYSTECTOMY Left 10/26/1999   Procedure: LAPAROSCOPIC CHOLECYSTECTOMY; Location: [facility]; Surgeon: Rochel Brome, MD   LEFT HEART CATH AND CORONARY ANGIOGRAPHY N/A 11/18/2021   Procedure: LEFT HEART CATH AND CORONARY ANGIOGRAPHY;  Surgeon: Yolonda Kida, MD;  Location: [place] [facility];  Service: Cardiovascular;  Laterality: N/A;   LEFT HEART CATH AND CORS/GRAFTS ANGIOGRAPHY Left 11/19/2002   Procedure: LEFT HEART CATH AND CORS/GRAFTS ANGIOGRAPHY; Location: [place]; Surgeon: Katrine Coho, MD   LEFT HEART CATH AND CORS/GRAFTS ANGIOGRAPHY Left 09/17/2003   Procedure: LEFT HEART CATH AND CORS/GRAFTS ANGIOGRAPHY; Location: [place]; Surgeon: Katrine Coho, MD   LOWER EXTREMITY ANGIOGRAPHY Right 01/06/2022   Procedure: Lower Extremity Angiography;  Surgeon: Katha Cabal, MD;  Location: [place] [facility];  Service: Cardiovascular;  Laterality: Right;   RENAL ARTERY ANGIOPLASTY Bilateral 12/2013   TOE AMPUTATION Right    small toe   TONSILLECTOMY AND ADENOIDECTOMY     TOTAL HIP ARTHROPLASTY Left 2005   TOTAL HIP ARTHROPLASTY Right 2015   UPPER EXTREMITY ANGIOGRAPHY Left 11/23/2021   Procedure: UPPER EXTREMITY ANGIOGRAPHY;  Surgeon: Algernon Huxley, MD;  Location: [place] [facility];  Service: Cardiovascular;  Laterality: Left;    Assessment &  Plan Clinical Impression: Patient is a 81 y.o. year old female who presented to [facility] 03/17/2022 for elective bilateral superior femoral artery and left iliac stenting with vascular surgery due to peripheral vascular disease and debilitating claudication with some mild rest pain.  Underwent left common femoral, profunda femoris and superficial femoral artery endarterectomies as well as right common femoral profunda femoris and superficial femoral artery enterectomy's stent placement to left external iliac artery with 8 mm diameter by 4 cm length of life star stent 03/17/2022 per Dr. Leotis Pain.  Postoperative acute respiratory failure cardiac arrest requiring CPR x2 minutes. She did require intubation for airway protection and extubated same day.  Ongoing wound care bilateral groin dressings with noted drainage wound VAC of been removed 03/22/2022 wound cultures pending.  Therapy evaluations completed patient was admitted for a comprehensive rehab program 03/24/2022. Patient with persistent drainage from right groin wound she was covered with Keflex for recent wound cultures.  Follow-up chemistries noted leukocytosis 15,600 with hemoglobin 9.5, chemistries unremarkable except BUN 24 creatinine 1.91, blood cultures no growth to date, surgical PCR screening positive.  Vascular surgery consulted 03/29/2022 and felt debridement was needed.  Patient was discharged to acute care services 03/29/2022 and underwent incision and debridement of right groin wound reexposure of the common femoral artery  common femoral artery patch excision common femoral patch plasty using ipsilateral greater saphenous vein and wound VAC placement 03/30/2022 per Dr. Orlie Pollen through 04/06/2022. Therapy evaluations resumed and patient was admitted back to inpatient rehab services to continue comprehensive intensive rehab therapies.Patient transferred to CIR on 04/05/2022 .    Patient currently requires  Supervision to min assist overall  with basic  self-care skills secondary to muscle weakness, decreased cardiorespiratoy endurance, and decreased sitting balance, decreased standing balance, and decreased balance strategies.  Prior to hospitalization, patient could complete all ADL and IADL tasks with independent .  Patient will benefit from skilled intervention to increase independence with basic self-care skills prior to discharge. Anticipate patient will require follow up home health.  OT - End of Session Activity Tolerance: Tolerates 30+ min activity with multiple rests Endurance Deficit: Yes Endurance Deficit Description: frequent rest breaks during functional activity OT Assessment Rehab Potential (ACUTE ONLY): Good OT Barriers to Discharge: Decreased caregiver support;Wound Care;Lack of/limited family support OT Patient demonstrates impairments in the following area(s): Balance;Motor;Pain;Safety OT Basic ADL's Functional Problem(s): Grooming;Bathing;Dressing;Toileting OT Transfers Functional Problem(s): Toilet;Tub/Shower OT Additional Impairment(s): Fuctional Use of Upper Extremity OT Plan OT Intensity: Minimum of 1-2 x/day, 45 to 90 minutes OT Frequency: 5 out of 7 days OT Duration/Estimated Length of Stay: ~7-9 days OT Treatment/Interventions: Balance/vestibular training;Disease mangement/prevention;Neuromuscular re-education;Self Care/advanced ADL retraining;Therapeutic Exercise;Cognitive remediation/compensation;DME/adaptive equipment instruction;Pain management;Skin care/wound managment;UE/LE Strength taining/ROM;Community reintegration;Functional electrical stimulation;Patient/family education;UE/LE Coordination activities;Discharge planning;Functional mobility training;Psychosocial support;Therapeutic Activities OT Basic Self-Care Anticipated Outcome(s): mod i OT Toileting Anticipated Outcome(s): modi OT Bathroom Transfers Anticipated Outcome(s): modi OT Recommendation Patient destination: Home Follow Up Recommendations:  Home health OT Equipment Recommended: To be determined   OT Evaluation Precautions/Restrictions  Precautions Precautions: Fall;Other (comment) Precaution Comments: R groin wound vac Restrictions Weight Bearing Restrictions: No  Pain Pain Assessment Pain Scale: 0-10 Pain Score: 8  Pain Type: Acute pain;Surgical pain Pain Location: Groin Pain Orientation: Right Pain Descriptors / Indicators: Aching;Grimacing;Discomfort Pain Frequency: Intermittent Pain Onset: On-going Pain Intervention(s): Emotional support;Medication (See eMAR) Home Living/Prior Functioning Home Living Family/patient expects to be discharged to:: Private residence Living Arrangements: Alone Available Help at Discharge: Family, Available PRN/intermittently Type of Home: House Home Access: Level entry Home Layout: One level Bathroom Shower/Tub: Tub/shower unit, Multimedia programmer: Associate Professor Accessibility: Yes Additional Comments: pt family visiting from [place], unsure how long they are staying to assist her  Lives With: Alone IADL History Homemaking Responsibilities: Yes Meal Prep Responsibility: Primary Laundry Responsibility: Primary Cleaning Responsibility: Primary Bill Paying/Finance Responsibility: Primary Shopping Responsibility: Primary Current License: Yes Prior Function Level of Independence: Independent with gait, Independent with transfers  Able to Take Stairs?: Yes Driving: Yes Vision Baseline Vision/History: 0 No visual deficits Ability to See in Adequate Light: 0 Adequate Patient Visual Report: No change from baseline Vision Assessment?: No apparent visual deficits Perception  Perception: Within Functional Limits Praxis Praxis: Intact Cognition Cognition Overall Cognitive Status: Within Functional Limits for tasks assessed Arousal/Alertness: Awake/alert Memory: Appears intact Attention: Selective Selective Attention: Appears intact Awareness: Appears  intact Problem Solving: Appears intact Safety/Judgment: Appears intact Brief Interview for Mental Status (BIMS) Repetition of Three Words (First Attempt): 3 Temporal Orientation: Year: Correct Temporal Orientation: Month: Accurate within 5 days Temporal Orientation: Day: Correct Recall: "Sock": Yes, no cue required Recall: "Blue": Yes, no cue required Recall: "Bed": Yes, no cue required BIMS Summary Score: 15 Sensation Sensation Light Touch: Appears Intact Hot/Cold: Appears Intact Proprioception: Appears Intact Stereognosis: Appears Intact Coordination Gross Motor Movements are Fluid  and Coordinated: Yes Fine Motor Movements are Fluid and Coordinated: Yes Coordination and Movement Description: impaired 2/2 pain Finger Nose Finger Test: Functional Motor  Motor Motor: Abnormal postural alignment and control Motor - Skilled Clinical Observations: impaired 2/2 global weakness and pain  Trunk/Postural Assessment  Cervical Assessment Cervical Assessment: Within Functional Limits Thoracic Assessment Thoracic Assessment: Within Functional Limits Lumbar Assessment Lumbar Assessment: Within Functional Limits Postural Control Postural Control: Within Functional Limits  Balance Balance Balance Assessed: Yes Static Sitting Balance Static Sitting - Balance Support: No upper extremity supported;Feet supported Static Sitting - Level of Assistance: 5: Stand by assistance Dynamic Sitting Balance Dynamic Sitting - Balance Support: No upper extremity supported;Feet supported;During functional activity Dynamic Sitting - Level of Assistance: 5: Stand by assistance Static Standing Balance Static Standing - Balance Support: Bilateral upper extremity supported;During functional activity Static Standing - Level of Assistance: 5: Stand by assistance Dynamic Standing Balance Dynamic Standing - Balance Support: Bilateral upper extremity supported;During functional activity Dynamic Standing -  Level of Assistance: 5: Stand by assistance Extremity/Trunk Assessment RUE Assessment RUE Assessment: Within Functional Limits LUE Assessment LUE Assessment: Within Functional Limits  Care Tool Care Tool Self Care Eating        Oral Care    Oral Care Assist Level: Supervision/Verbal cueing    Bathing   Body parts bathed by patient: Right arm;Left arm;Chest;Abdomen;Front perineal area;Buttocks;Right upper leg;Left upper leg;Face Body parts bathed by helper: Left lower leg;Right lower leg   Assist Level: Minimal Assistance - Patient > 75%    Upper Body Dressing(including orthotics)   What is the patient wearing?: Hospital gown only   Assist Level: Supervision/Verbal cueing    Lower Body Dressing (excluding footwear)   What is the patient wearing?: Pants Assist for lower body dressing: Minimal Assistance - Patient > 75%    Putting on/Taking off footwear   What is the patient wearing?: Ted hose;Non-skid slipper socks Assist for footwear: Dependent - Patient 0%       Care Tool Toileting Toileting activity   Assist for toileting: Contact Guard/Touching assist       Care Tool Transfers Sit to stand transfer   Sit to stand assist level: Contact Guard/Touching assist    Chair/bed transfer   Chair/bed transfer assist level: Contact Guard/Touching assist     Toilet transfer   Assist Level: Contact Guard/Touching assist     Care Tool Cognition  Expression of Ideas and Wants Expression of Ideas and Wants: 4. Without difficulty (complex and basic) - expresses complex messages without difficulty and with speech that is clear and easy to understand  Understanding Verbal and Non-Verbal Content Understanding Verbal and Non-Verbal Content: 4. Understands (complex and basic) - clear comprehension without cues or repetitions   Memory/Recall Ability Memory/Recall Ability : Current season;Location of own room;Staff names and faces;That he or she is in a hospital/hospital unit    Refer to Care Plan for [place] 1 OT Short Term Goal 1 (Week 1): STG=LTG (Due to ELOS)  Recommendations for other services: None    Skilled Therapeutic Intervention  Patient participated in skilled OT session focusing on ADL re-training, activity tolerance, functional transfers, and safety awareness. Pt completed ADL of bathing and dressing while seated at sink level. Able to complete sit to stands throughout session as needed. Rest breaks provided as needed. Patient demonstrates decreased endurance and activity tolerance as session progressed. Provided VC for form and technique as needed. Due to pain from recent procedure, patient  was unable to attempt any lower body dressing and bathing unless able to reach without completing hip flexion. Pt transferred to recliner once session was finished. Recommended family bring in clothing that is easy to get on and off and comfortable for therapy to practice ADL skills as well as tennis shoes for ambulating.   ADL ADL Eating: Independent Where Assessed-Eating: Bed level Grooming: Setup Where Assessed-Grooming: Sitting at sink Upper Body Bathing: Supervision/safety Where Assessed-Upper Body Bathing: Sitting at sink Lower Body Bathing: Minimal assistance Where Assessed-Lower Body Bathing: Sitting at sink Upper Body Dressing: Supervision/safety Where Assessed-Upper Body Dressing: Sitting at sink Lower Body Dressing: Moderate assistance Where Assessed-Lower Body Dressing: Sitting at sink Toileting: Contact guard Where Assessed-Toileting: Editor, commissioning Method: Arts development officer: Grab bars Mobility  Bed Mobility Supine to Sit: Minimal Assistance - Patient > 75% Transfers Sit to Stand: Contact Guard/Touching assist Stand to Sit: Contact Guard/Touching assist   Discharge Criteria: Patient will be discharged from OT if patient refuses treatment 3  consecutive times without medical reason, if treatment goals not met, if there is a change in medical status, if patient makes no progress towards goals or if patient is discharged from hospital.  The above assessment, treatment plan, treatment alternatives and goals were discussed and mutually agreed upon: by patient  Ailene Ravel, OTR/L,CBIS  Supplemental OT - [place] and [facility]  04/06/2022, 3:54 PM

## 2022-04-06 NOTE — Discharge Instructions (Addendum)
Inpatient Rehab Discharge Instructions  Sabrina [name] Discharge date and time: No discharge date for patient encounter.   Activities/Precautions/ Functional Status: Activity: activity as tolerated Diet: regular diet Wound Care: Routine skin checks Functional status:  ___ No restrictions     ___ Walk up steps independently ___ 24/7 supervision/assistance   ___ Walk up steps with assistance ___ Intermittent supervision/assistance  ___ Bathe/dress independently ___ Walk with walker     _x__ Bathe/dress with assistance ___ Walk Independently    ___ Shower independently ___ Walk with assistance    ___ Shower with assistance ___ No alcohol     ___ Return to work/school ________  COMMUNITY REFERRALS UPON DISCHARGE:    Home Health:   PT     OT       RN                [place] *Please expect follow-up within 2-3 days to schedule your home visit. If you have not received follow-up, be sure to contact the branch directly.*      Special Instructions: No driving smoking or alcohol  Silver Hydrofiber cut to fit, on wound bed cover with 4 x 4 top with one half ABD secure and tape daily   My questions have been answered and I understand these instructions. I will adhere to these goals and the provided educational materials after my discharge from the hospital.  Patient/Caregiver Signature _______________________________ Date __________  Clinician Signature _______________________________________ Date __________  Please bring this form and your medication list with you to all your follow-up doctor's appointments.

## 2022-04-06 NOTE — Progress Notes (Signed)
Physical Therapy Session Note  Patient Details  Name: Sabrina [name] MRN: [id number] Date of Birth: Nov 22, 1940  Today's Date: 04/06/2022 PT Individual Time: 1300-1415 PT Individual Time Calculation (min): 75 min   Short Term Goals: Week 1:  PT Short Term Goal 1 (Week 1): =LTG due to ELOS  Skilled Therapeutic Interventions/Progress Updates:  Pt received seated in recliner in room, agreeable to PT session. Pt has increase in R groin pain during session, nursing notified and checking on pain medication. Sit to stand with SBA to CGA to RW during session. Ambulation 2 x 150 ft with RW and SBA to CGA for balance. Patient demonstrates increased fall risk as noted by score of  21/56 on Berg Balance Scale.  (<36= high risk for falls, close to 100%; 37-45 significant >80%; 46-51 moderate >50%; 52-55 lower >25%). Reviewed score and functional implications. Pt unable to lift RLE up onto 6" step due to pain and hip flexor weakness. Standing alt L/R 1" step-taps with RW and CGA for balance with focus on hip strengthening, 3 x 10 reps each. Sidesteps L/R with RW and CGA 3 x 10 ft each direction for hip strengthening. Pt requests to return to bed at end of session, left seated EOB in care of nursing for medication administration.  Therapy Documentation Precautions:  Precautions Precautions: Fall, Other (comment) Precaution Comments: R groin wound vac Restrictions Weight Bearing Restrictions: No  Balance: Balance Balance Assessed: Yes Standardized Balance Assessment Standardized Balance Assessment: Berg Balance Test Berg Balance Test Sit to Stand: Able to stand  independently using hands Standing Unsupported: Able to stand 2 minutes with supervision Sitting with Back Unsupported but Feet Supported on Floor or Stool: Able to sit safely and securely 2 minutes Stand to Sit: Sits safely with minimal use of hands Transfers: Able to transfer with verbal cueing and /or supervision Standing Unsupported  with Eyes Closed: Needs help to keep from falling Standing Ubsupported with Feet Together: Needs help to attain position but able to stand for 30 seconds with feet together From Standing, Reach Forward with Outstretched Arm: Loses balance while trying/requires external support From Standing Position, Pick up Object from Floor: Unable to try/needs assist to keep balance From Standing Position, Turn to Look Behind Over each Shoulder: Turn sideways only but maintains balance Turn 360 Degrees: Needs close supervision or verbal cueing Standing Unsupported, Alternately Place Feet on Step/Stool: Needs assistance to keep from falling or unable to try Standing Unsupported, One Foot in Front: Loses balance while stepping or standing Standing on One Leg: Tries to lift leg/unable to hold 3 seconds but remains standing independently Total Score: 21 Static Sitting Balance Static Sitting - Balance Support: No upper extremity supported;Feet supported Static Sitting - Level of Assistance: 5: Stand by assistance Dynamic Sitting Balance Dynamic Sitting - Balance Support: No upper extremity supported;Feet supported;During functional activity Dynamic Sitting - Level of Assistance: 5: Stand by assistance Static Standing Balance Static Standing - Balance Support: Bilateral upper extremity supported;During functional activity Static Standing - Level of Assistance: 5: Stand by assistance Dynamic Standing Balance Dynamic Standing - Balance Support: Bilateral upper extremity supported;During functional activity Dynamic Standing - Level of Assistance: 5: Stand by assistance     Therapy/Group: Individual Therapy   Excell Seltzer, PT, DPT, CSRS 04/06/2022, 3:33 PM

## 2022-04-06 NOTE — Progress Notes (Signed)
Patient ID: Sabrina [name], female   DOB: 08/22/1941, 81 y.o.   MRN: [id number]  This SW familiar with patient from previous admission. Please see assessment completed on 03/25/22.  SW spoke with pt dtr Sabrina [name] to inform will update after team conference, reports she is flying out tomorrow but will return. SW shared will update.   SW went by pt room to provide updates from team conference, however, pt not in room as in therapy.   1632- SW spoke with pt dtr Sabrina [name] to provide updates from team conference, and d/c date 6/23. SW shared will begin to work on home infusion company and wound vac for d/c needs. Fam edu schedule for Tuesday (6/20) 8am until completed with her dtr and SIL.   Loralee Pacas, MSW, [place] Office: [phone] Cell: [phone] Fax: [phone]

## 2022-04-06 NOTE — Evaluation (Signed)
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 b.) TTE 11/16/2021: EF >55%; mild AS (MPG 9 mmHg)   CAD (coronary artery disease)    a.) s/p 3v CABG 03/29/2000   Carotid artery stenosis    a.) s/p LEFT CEA 09/09/2003. b.) Carotid doppler 01/60/1093: 2-35% LICA, CTO RICA; subclavian stenosis   Cataracts, bilateral    Cervical spondylosis without myelopathy    Chronic diastolic CHF (congestive heart failure), NYHA class 3 (HCC)    a.) TTE 05/27/2016: EF >55%, mild LA enlargement, triv PR, mild MR, mod TR; G3DD. b.) TTE 12/12/2017: EF >55%, mild LVH, BAE, mild MR/PR, mod TR; RVSP 52.8 mmHg. c.) TTE 03/18/2020: EF >55%, BAD, AS (MPG 8.7 mmHg); triv MR, mild TR/PR. d.) TTE 11/16/2021: EF >55%, LVH, G1DD, triv MR, mild PR, mod TR; AS (MPG 9 mmHg); MS (MPG 5 mmHg)   Chronic kidney disease, stage III (moderate) (HCC)    Chronic narcotic use 06/24/2014   Chronic pain syndrome    GERD (gastroesophageal reflux disease)    History of 2019 novel coronavirus disease (COVID-19) 09/22/2021   Hyperlipidemia    Hypertension    Long  term current use of antithrombotics/antiplatelets    a.) on daily DAPT therapy (ASA + clopidogrel)   Lumbar stenosis with neurogenic claudication    Mitral stenosis 11/16/2021   a.) TTE 11/16/2021: EF >55%; mod MS (MPG 5 mmHg)   Osteoarthritis of hip    Pulmonary hypertension ([place]) 12/12/2017   a.) TTE 12/12/2017: mild; RVSP 52.8 mmHg   PVD (peripheral vascular disease) (HCC)    Renal artery stenosis (HCC)    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   Past Surgical History:  Past Surgical History:  Procedure Laterality Date   ABDOMINAL HYSTERECTOMY  1976   CAROTID ARTERY ANGIOPLASTY Left    CAROTID ENDARTERECTOMY Left 09/09/2003   Procedure: CAROTID ENDARTERECTOMY; Location: [facility]; Surgeon: Maura Crandall, MD   COLONOSCOPY WITH PROPOFOL N/A 08/16/2017   Procedure: COLONOSCOPY WITH PROPOFOL;  Surgeon: [name], MD;  Location: [facility];  Service: Endoscopy;  Laterality: N/A;   CORONARY ANGIOPLASTY WITH STENT PLACEMENT  2000   CORONARY ARTERY BYPASS GRAFT N/A 03/29/2000   Procedure: 3v CORONARY ARTERY BYPASS GRAFT; Location: [facility]   CYSTOSCOPY WITH STENT PLACEMENT Bilateral    ENDARTERECTOMY FEMORAL Bilateral 03/17/2022   Procedure: ENDARTERECTOMY FEMORAL ( BILATERAL SFA STENT);  Surgeon: Delana Meyer,  [name], MD;  Location: [facility];  Service: Vascular;  Laterality: Bilateral;   ENDARTERECTOMY FEMORAL Right 03/30/2022   Procedure: RE-EXPOSURE OF RIGHT COMMON FEMORAL ARTERY, RE-DO RIGHT FEMORAL ENDARTERECTOMY WITH VEIN PATCH;  Surgeon: Broadus John, MD;  Location: [facility];  Service: Vascular;  Laterality: Right;  WOUND VAC   ESOPHAGOGASTRODUODENOSCOPY (EGD) WITH PROPOFOL N/A 08/16/2017   Procedure: ESOPHAGOGASTRODUODENOSCOPY (EGD) WITH PROPOFOL;  Surgeon: [name],  MD;  Location: [facility];  Service: Endoscopy;  Laterality: N/A;   ESOPHAGOGASTRODUODENOSCOPY (EGD) WITH PROPOFOL N/A 06/29/2018   Procedure: ESOPHAGOGASTRODUODENOSCOPY (EGD) WITH PROPOFOL;  Surgeon: Virgel Manifold, MD;  Location: [facility];  Service: Endoscopy;  Laterality: N/A;   GROIN DEBRIDEMENT Right 03/30/2022   Procedure: [place];  Surgeon: Broadus John, MD;  Location: [place];  Service: Vascular;  Laterality: Right;   INSERTION OF ILIAC STENT Left 03/17/2022   Procedure: INSERTION OF ILIAC STENT;  Surgeon: Katha Cabal, MD;  Location: [facility];  Service: Vascular;  Laterality: Left;   LAPAROSCOPIC CHOLECYSTECTOMY Left 10/26/1999   Procedure: LAPAROSCOPIC CHOLECYSTECTOMY; Location: [facility]; Surgeon: Rochel Brome, MD   LEFT HEART CATH AND CORONARY ANGIOGRAPHY N/A 11/18/2021   Procedure: LEFT HEART CATH AND CORONARY ANGIOGRAPHY;  Surgeon: Yolonda Kida, MD;  Location: [place] [facility];  Service: Cardiovascular;  Laterality: N/A;   LEFT HEART CATH AND CORS/GRAFTS ANGIOGRAPHY Left 11/19/2002   Procedure: LEFT HEART CATH AND CORS/GRAFTS ANGIOGRAPHY; Location: [facility]; Surgeon: Katrine Coho, MD   LEFT HEART CATH AND CORS/GRAFTS ANGIOGRAPHY Left 09/17/2003   Procedure: LEFT HEART CATH AND CORS/GRAFTS ANGIOGRAPHY; Location: [place]; Surgeon: Katrine Coho, MD   LOWER EXTREMITY ANGIOGRAPHY Right 01/06/2022   Procedure: Lower Extremity Angiography;  Surgeon: Katha Cabal, MD;  Location: [place] [facility];  Service: Cardiovascular;  Laterality: Right;   RENAL ARTERY ANGIOPLASTY Bilateral 12/2013   TOE AMPUTATION Right    small toe   TONSILLECTOMY AND ADENOIDECTOMY     TOTAL HIP ARTHROPLASTY Left 2005   TOTAL HIP ARTHROPLASTY Right 2015   UPPER EXTREMITY ANGIOGRAPHY Left 11/23/2021   Procedure: UPPER EXTREMITY ANGIOGRAPHY;  Surgeon: Algernon Huxley, MD;  Location: [place] [facility];  Service: Cardiovascular;  Laterality: Left;     Assessment & Plan Clinical Impression:  Sabrina [name] is an 81 year old right-handed female with history of obesity BMI 30.53, chronic hip pain, chronic anemia, CAD/CABG x3 with LIMA-LAD,SVG-ramus intermedius 03/2000 with recent heart catheterization 11/18/2021 showing extensive native CAD and 100% occlusion SVG-RCA and 90%, distal SVG-OM with significant stenosis of her left subclavian status post left subclavian stenting 11/23/2021 maintained on low-dose aspirin, diastolic congestive heart failure, carotid artery angioplasty 2004, CKD stage III, hypertension, hyperlipidemia, peripheral vascular disease with multiple interventions in the past.  Per chart review lives alone.  1 level home.  Family in area with good support.  Presented to [facility] 03/17/2022 for elective bilateral superior femoral artery and left iliac stenting with vascular surgery due to peripheral vascular disease and debilitating claudication with some mild rest pain.  Underwent left common femoral, profunda femoris and superficial femoral artery endarterectomies as well as right common femoral profunda femoris and superficial femoral artery enterectomy's stent placement to left external iliac artery with 8 mm diameter by 4 cm length of life star stent 03/17/2022 per Dr. Leotis Pain.  Postoperative acute respiratory failure cardiac arrest requiring CPR x2 minutes.  Found to have markedly elevated troponin of 15,000.  She did require intubation for airway protection and extubated same  day.  Echocardiogram with ejection fraction of 60 to 65% no wall motion abnormality.  Maintained on aspirin and Plavix for least 12 months and then plan to discuss outpatient cardiac catheterization.  Ranexa ongoing at 500 mg twice daily per cardiology service Dr. Lujean Amel.  Ongoing wound care bilateral groin dressings with noted drainage wound VAC of been removed 03/22/2022 wound cultures pending.  Tolerating a regular diet.  Acute on chronic anemia 8.3  monitor transfused 5/27 1 unit packed red blood cells.  Therapy evaluations completed patient was admitted for a comprehensive rehab program 03/24/2022.  Patient was slow but progressive gains with functional mobility.  Patient with persistent drainage from right groin wound she was covered with Keflex for recent wound cultures.  Follow-up chemistries noted leukocytosis 15,600 with hemoglobin 9.5, chemistries unremarkable except BUN 24 creatinine 1.91, blood cultures no growth to date, surgical PCR screening positive.  Vascular surgery consulted 03/29/2022 and felt debridement was needed.  Patient was discharged to acute care services 03/29/2022 and underwent incision and debridement of right groin wound reexposure of the common femoral artery common femoral artery patch excision common femoral patch plasty using ipsilateral greater saphenous vein and wound VAC placement 03/30/2022 per Dr. Orlie Pollen through 04/06/2022.  Patient's aspirin and Plavix ongoing as prior to surgical intervention.  Currently maintained on Zosyn for wound coverage and awaiting plan of duration.  Therapy evaluations resumed and patient was admitted back to inpatient rehab services to continue comprehensive intensive rehab therapies. Patient transferred to CIR on 04/05/2022 .   Patient currently requires min with mobility secondary to muscle weakness and muscle joint tightness, decreased cardiorespiratoy endurance, and pain .  Prior to hospitalization, patient was independent  with mobility and lived with Alone in a House home.  Home access is  Level entry.  Patient will benefit from skilled PT intervention to maximize safe functional mobility, minimize fall risk, and decrease caregiver burden for planned discharge home with intermittent assist.  Anticipate patient will benefit from follow up [facility] at discharge.  PT - End of Session Activity Tolerance: Tolerates 30+ min activity with multiple rests Endurance Deficit: Yes Endurance Deficit  Description: frequent rest breaks during functional activity PT Assessment Rehab Potential (ACUTE/IP ONLY): Good PT Patient demonstrates impairments in the following area(s): Endurance;Pain;Safety;Sensory;Skin Integrity PT Transfers Functional Problem(s): Bed Mobility;Bed to Chair;Car;Furniture;Floor PT Locomotion Functional Problem(s): Ambulation;Wheelchair Mobility;Stairs PT Plan PT Intensity: Minimum of 1-2 x/day ,45 to 90 minutes PT Frequency: 5 out of 7 days PT Duration Estimated Length of Stay: 7-10 days PT Treatment/Interventions: Ambulation/gait training;Balance/vestibular training;Community reintegration;Discharge planning;Disease management/prevention;DME/adaptive equipment instruction;Functional mobility training;Pain management;Patient/family education;Stair training;Therapeutic Activities;Therapeutic Exercise;UE/LE Strength taining/ROM;UE/LE Coordination activities PT Transfers Anticipated Outcome(s): mod I PT Locomotion Anticipated Outcome(s): mod I at ambulatory level with LRAD PT Recommendation Follow Up Recommendations: Home health PT Patient destination: Home Equipment Recommended: Rolling walker with 5" wheels Equipment Details: per pt report she already owns RW   PT Evaluation Precautions/Restrictions Precautions Precautions: Fall;Other (comment) Precaution Comments: R groin wound vac Restrictions Weight Bearing Restrictions: No Pain Interference Pain Interference Pain Effect on Sleep: 2. Occasionally Pain Interference with Therapy Activities: 3. Frequently Pain Interference with Day-to-Day Activities: 3. Frequently Home Living/Prior Functioning Home Living Available Help at Discharge: Family;Available PRN/intermittently Type of Home: House Home Access: Level entry Home Layout: One level Additional Comments: pt family visiting from [place], unsure how long they are staying to assist her  Lives With: Alone Prior Function Level of Independence: Independent with  gait;Independent with transfers  Able to Take  Stairs?: Yes Driving: Yes Vision/Perception  Vision - History Patient Visual Report: No change from baseline Perception Perception: Within Functional Limits Praxis Praxis: Intact  Cognition Overall Cognitive Status: Within Functional Limits for tasks assessed Arousal/Alertness: Awake/alert Orientation Level: Oriented X4 Year: 2023 Attention: Selective Selective Attention: Appears intact Memory: Appears intact Awareness: Appears intact Problem Solving: Appears intact Safety/Judgment: Appears intact Sensation Sensation Light Touch: Impaired Detail Light Touch Impaired Details: Impaired RLE;Impaired LLE (impaired in R thigh region and L ankle) Proprioception: Appears Intact Coordination Gross Motor Movements are Fluid and Coordinated: No Fine Motor Movements are Fluid and Coordinated: Yes Coordination and Movement Description: impaired 2/2 pain Heel Shin Test: not tested 2/2 pain and hip weakness Motor  Motor Motor: Abnormal postural alignment and control Motor - Skilled Clinical Observations: impaired 2/2 global weakness and pain  Trunk/Postural Assessment  Cervical Assessment Cervical Assessment: Within Functional Limits Thoracic Assessment Thoracic Assessment: Within Functional Limits Lumbar Assessment Lumbar Assessment: Within Functional Limits Postural Control Postural Control: Within Functional Limits  Balance Balance Balance Assessed: Yes Static Sitting Balance Static Sitting - Balance Support: No upper extremity supported;Feet supported Static Sitting - Level of Assistance: 6: Modified independent (Device/Increase time);5: Stand by assistance Dynamic Sitting Balance Dynamic Sitting - Balance Support: No upper extremity supported;Feet supported;During functional activity Dynamic Sitting - Level of Assistance: 5: Stand by assistance Static Standing Balance Static Standing - Balance Support: Bilateral upper  extremity supported;During functional activity Static Standing - Level of Assistance: 5: Stand by assistance;4: Min assist Dynamic Standing Balance Dynamic Standing - Balance Support: Bilateral upper extremity supported;During functional activity Dynamic Standing - Level of Assistance: 5: Stand by assistance;4: Min assist Extremity Assessment   RLE Assessment RLE Assessment: Exceptions to [facility] Active Range of Motion (AROM) Comments: impaired 2/2 pain General Strength Comments: impaired 2/2 pain, see below RLE Strength Right Hip Flexion: 2-/5 Right Knee Flexion: 4/5 Right Knee Extension: 4/5 Right Ankle Dorsiflexion: 5/5 LLE Assessment LLE Assessment: Exceptions to [facility] General Strength Comments: impaired 2/2 pain, see below LLE Strength Left Hip Flexion: 3/5 Left Knee Flexion: 4/5 Left Knee Extension: 4/5 Left Ankle Dorsiflexion: 4/5  Care Tool Care Tool Bed Mobility Roll left and right activity   Roll left and right assist level: Minimal Assistance - Patient > 75%    Sit to lying activity   Sit to lying assist level: Minimal Assistance - Patient > 75%    Lying to sitting on side of bed activity   Lying to sitting on side of bed assist level: the ability to move from lying on the back to sitting on the side of the bed with no back support.: Minimal Assistance - Patient > 75%     Care Tool Transfers Sit to stand transfer   Sit to stand assist level: Contact Guard/Touching assist    Chair/bed transfer   Chair/bed transfer assist level: Contact Guard/Touching Cabin crew transfer assist level: Moderate Assistance - Patient 50 - 74%      Care Tool Locomotion Ambulation   Assist level: Contact Guard/Touching assist Assistive device: Walker-rolling Max distance: 150'  Walk 10 feet activity   Assist level: Contact Guard/Touching assist Assistive device: Walker-rolling   Walk 50 feet with 2 turns activity   Assist level:  Contact Guard/Touching assist Assistive device: Walker-rolling  Walk 150 feet activity   Assist level: Contact Guard/Touching assist Assistive device: Walker-rolling  Walk 10 feet on uneven surfaces activity  Walk 10 feet on uneven surfaces activity did not occur: Safety/medical concerns      Stairs Stair activity did not occur: Safety/medical concerns        Walk up/down 1 step activity Walk up/down 1 step or curb (drop down) activity did not occur: Safety/medical concerns      Walk up/down 4 steps activity Walk up/down 4 steps activity did not occur: Safety/medical concerns      Walk up/down 12 steps activity Walk up/down 12 steps activity did not occur: Safety/medical concerns      Pick up small objects from floor Pick up small object from the floor (from standing position) activity did not occur: Safety/medical concerns      Wheelchair Is the patient using a wheelchair?: No          Wheel 50 feet with 2 turns activity      Wheel 150 feet activity        Refer to Care Plan for [place] [facility] PT Short Term Goal 1 (Week 1): =LTG due to ELOS  Recommendations for other services: None   Skilled Therapeutic Intervention Evaluation completed (see details above and below) with education on PT POC and goals and individual treatment initiated with focus on setting pt up with appropriate equipment to be utilized during rehab stay and assessment of functional mobility. Pt received supine in bed, agreeable to PT evaluation. Pt has pain at site of R groin wound/wound vac. Pt able to receive pain medication at end of session prior to wound vac change. Supine to sit with min A for some trunk elevation. Sit to stand with close Supervision to CGA to RW during session. Pt transfers with RW and CGA during session. Ambulation x 150 ft, x 100 ft with RW and close Supervision to CGA for balance, antalgic gait pattern. Car transfer with mod A needed for BLE management.  Pt transfers back to bed at end of session for wound vac change. Sit to supine min A needed for RLE management. Pt left seated in bed with needs in reach, bed alarm in place.  Mobility Bed Mobility Bed Mobility: Rolling Right;Rolling Left;Supine to Sit;Sit to Supine Rolling Right: Minimal Assistance - Patient > 75% Rolling Left: Minimal Assistance - Patient > 75% Supine to Sit: Minimal Assistance - Patient > 75% Sit to Supine: Minimal Assistance - Patient > 75% Transfers Transfers: Sit to Stand;Stand Pivot Transfers Sit to Stand: Contact Guard/Touching assist Stand to Sit: Contact Guard/Touching assist Stand Pivot Transfers: Contact Guard/Touching assist Stand Pivot Transfer Details: Verbal cues for precautions/safety Transfer (Assistive device): Rolling walker Locomotion  Gait Gait Distance (Feet): 150 Feet Assistive device: Rolling walker Gait Gait Pattern: Impaired (antalgic) Gait velocity: decreased Stairs / Additional Locomotion Stairs: No Wheelchair Mobility Wheelchair Mobility: No   Discharge Criteria: Patient will be discharged from PT if patient refuses treatment 3 consecutive times without medical reason, if treatment goals not met, if there is a change in medical status, if patient makes no progress towards goals or if patient is discharged from hospital.  The above assessment, treatment plan, treatment alternatives and goals were discussed and mutually agreed upon: by patient   Excell Seltzer, PT, DPT, CSRS 04/06/2022, 12:16 PM

## 2022-04-07 DIAGNOSIS — B999 Unspecified infectious disease: Secondary | ICD-10-CM

## 2022-04-07 DIAGNOSIS — D649 Anemia, unspecified: Secondary | ICD-10-CM

## 2022-04-07 DIAGNOSIS — I10 Essential (primary) hypertension: Secondary | ICD-10-CM

## 2022-04-07 MED ORDER — SODIUM CHLORIDE 0.9% FLUSH
10.0000 mL | INTRAVENOUS | Status: DC | PRN
  Administered 2022-04-10: 10 mL

## 2022-04-07 MED ORDER — CHLORHEXIDINE GLUCONATE CLOTH 2 % EX PADS
6.0000 | MEDICATED_PAD | Freq: Every day | CUTANEOUS | Status: DC
  Administered 2022-04-07 – 2022-04-09 (×3): 6 via TOPICAL

## 2022-04-07 NOTE — Progress Notes (Signed)
Peripherally Inserted Central Catheter Placement  The IV Nurse has discussed with the patient and/or persons authorized to consent for the patient, the purpose of this procedure and the potential benefits and risks involved with this procedure.  The benefits include less needle sticks, lab draws from the catheter, and the patient may be discharged home with the catheter. Risks include, but not limited to, infection, bleeding, blood clot (thrombus formation), and puncture of an artery; nerve damage and irregular heartbeat and possibility to perform a PICC exchange if needed/ordered by physician.  Alternatives to this procedure were also discussed.  Bard Power PICC patient education guide, fact sheet on infection prevention and patient information card has been provided to patient /or left at bedside.    PICC Placement Documentation  PICC Single Lumen 04/07/22 Right Brachial 36 cm 0 cm (Active)  Indication for Insertion or Continuance of Line Prolonged intravenous therapies 04/07/22 0932  Exposed Catheter (cm) 0 cm 04/07/22 0932  Site Assessment Clean, Dry, Intact 04/07/22 0932  Line Status Flushed;Saline locked;Blood return noted 04/07/22 0932  Dressing Type Transparent;Securing device 04/07/22 0932  Dressing Status Antimicrobial disc in place 04/07/22 0932  Dressing Intervention New dressing;Other (Comment) 04/07/22 0932  Dressing Change Due 04/14/22 04/07/22 0932       Sabrina [name] 04/07/2022, 9:46 AM

## 2022-04-07 NOTE — Progress Notes (Signed)
Occupational Therapy Session Note  Patient Details  Name: Ashleyanne LORRI FUKUHARA MRN: [id number] Date of Birth: 23-Mar-1941  Today's Date: 04/07/2022 OT Individual Time: 1324-4010 OT Individual Time Calculation (min): 70 min    Short Term Goals: Week 1:  OT Short Term Goal 1 (Week 1): STG=LTG (Due to ELOS)  Skilled Therapeutic Interventions/Progress Updates:    Pt resting in bed upon arrival and agreeable to getting OOB. Supine>sit EOB with min A using bed rails. Sitting balance with CGA. Pt required max A for donning pants. Sit>stand from EOB with CGA. Pt amb with RW in hallway X 2 (129' and 171') with rest break in ADL apartment. Pt required max A for sit>stand from sofa. Pt returned to room and transferred to recliner. All needs within reach. Son present.  Therapy Documentation Precautions:  Precautions Precautions: Fall, Other (comment) Precaution Comments: R groin wound vac Restrictions Weight Bearing Restrictions: No   Pain: Pain Assessment Pain Scale: 0-10 Pain Score: 5  Pain Type: Surgical pain Pain Location: Groin Pain Orientation: Right Pain Descriptors / Indicators: Aching Pain Frequency: Intermittent Pain Onset: On-going Pain Intervention(s): repositioned   Therapy/Group: Individual Therapy  Leroy Libman 04/07/2022, 12:12 PM

## 2022-04-07 NOTE — Progress Notes (Signed)
Physical Therapy Session Note  Patient Details  Name: Sabrina [name] MRN: [id number] Date of Birth: 1941/05/17  Today's Date: 04/07/2022 PT Individual Time: 7290-2111 PT Individual Time Calculation (min): 20 min   Short Term Goals: Week 1:  PT Short Term Goal 1 (Week 1): =LTG due to ELOS  Skilled Therapeutic Interventions/Progress Updates:    Pt received supine in bed, agreeable with encouragement to participate in therapy session. Pt reports pain at site of wound vac in R groin area as well as pain at site of IV in L forearm. Per nursing report pt to have PICC line placement this AM. Pt declines any pain medication this AM due to side effect of nausea and she reports not eating much for breakfast. Pt declines a snack at this time. Supine to sit with min A for trunk elevation with increased time, HOB elevated and use of bedrail. Increased time needed 2/2 pain. Sit to stand with CGA to RW. Standing BLE strengthening therex: marches, hip abd, heel raises, HS curls, hip flex x 10 reps each. IV team arrives during session for PICC line placement. Pt returned to supine with min A needed for RLE management. Pt left supine in bed in care of IV team. Pt missed 40 min of scheduled therapy session for PICC line placement.  Therapy Documentation Precautions:  Precautions Precautions: Fall, Other (comment) Precaution Comments: R groin wound vac Restrictions Weight Bearing Restrictions: No General: PT Amount of Missed Time (min): 40 Minutes PT Missed Treatment Reason: Unavailable (Comment);Nursing care (PICC line placement)      Therapy/Group: Individual Therapy   Excell Seltzer, PT, DPT, CSRS 04/07/2022, 11:39 AM

## 2022-04-07 NOTE — Progress Notes (Signed)
Occupational Therapy Session Note  Patient Details  Name: Sabrina [name] MRN: [id number] Date of Birth: 1941/02/28  Today's Date: 04/07/2022 OT Individual Time: 2552-5894 OT Individual Time Calculation (min): 55 min    Short Term Goals: Week 1:  OT Short Term Goal 1 (Week 1): STG=LTG (Due to ELOS)  Skilled Therapeutic Interventions/Progress Updates:    Pt resting in recliner upon arrival. OT intervention with focus on sit<>stand, standing balance, toileting, functional tranfsers, and functional amb with RW to increase independence with BADLs. Sit<>stand from recliner with CGA. Pt amb 217' and 276' with rest break between at supervision level. Standing balance with CGA for toielting tasks. Pt completed toilet transfer with CGA. Pt stood at sink with supervision to wash hands before returning to bed. Min A for sit>supine. Pt able to reposition in bed with supervision. Pt remained in bed with all needs within reach and bed alarm activated.   Therapy Documentation Precautions:  Precautions Precautions: Fall, Other (comment) Precaution Comments: R groin wound vac Restrictions Weight Bearing Restrictions: No Vital Signs:   Pain: Pt reports increased pain around wound vac with all transitional movement; repositioned and emotional support   Therapy/Group: Individual Therapy  Leroy Libman 04/07/2022, 2:31 PM

## 2022-04-07 NOTE — Progress Notes (Signed)
PROGRESS NOTE   Subjective/Complaints:  PICC replaced today. Reports pain under control.    ROS:  Pt denies SOB, abd pain, HA, CP, N/V/C/D, and vision changes   Objective:   [place] EKG SITE RITE  Result Date: 04/05/2022 If Site Rite image not attached, placement could not be confirmed due to current cardiac rhythm.  Recent Labs    04/05/22 0416 04/06/22 0508  WBC 9.9 8.7  HGB 8.6* 8.9*  HCT 25.8* 27.2*  PLT 342 381    Recent Labs    04/05/22 0416 04/06/22 0508  NA 140 137  K 3.9 3.7  CL 113* 107  CO2 18* 22  GLUCOSE 98 97  BUN 10 10  CREATININE 1.30* 1.32*  CALCIUM 8.6* 8.7*     Intake/Output Summary (Last 24 hours) at 04/07/2022 0759 Last data filed at 04/06/2022 2017 Gross per 24 hour  Intake 472 ml  Output 300 ml  Net 172 ml         Physical Exam: Vital Signs Blood pressure 128/76, pulse 74, temperature 98.3 F (36.8 C), resp. rate 16, height 5' (1.524 m), weight 68.1 kg, SpO2 100 %.   General: awake, alert, appropriate; supine in bed; , NAD, eating lunch HENT: conjugate gaze; oropharynx moist CV: regular rate; no JVD Pulmonary: CTA B/L; no W/R/R- good air movement GI: soft, NT, ND, (+)BS- slightly hypoactive Psychiatric: appropriate, pleasant  Neurological: Ox3 Neurologic: Cranial nerves II through XII intact, motor strength is 5/5 in bilateral deltoid, bicep, tricep, grip, 3- Bilateral hip flexor, knee extensors, 4/5 ankle dorsiflexor and plantar flexor Sensory exam normal sensation to light touch and proprioception in bilateral upper and lower extremities Cerebellar exam normal finger to nose to finger as well as heel to shin in bilateral upper and lower extremities Musculoskeletal: pain with B Hip ROM, no UE swelling or joint deformity, Neg knee or ankle effusion , mild tenderness right thigh Feet , s/p Right 5th toe amp well healed.  No other deformity , no skin breakdown  Skin- VAC on R  groin- no redness seen  Assessment/Plan: 1. Functional deficits which require 3+ hours per day of interdisciplinary therapy in a comprehensive inpatient rehab setting. Physiatrist is providing close team supervision and 24 hour management of active medical problems listed below. Physiatrist and rehab team continue to assess barriers to discharge/monitor patient progress toward functional and medical goals  Care Tool:  Bathing    Body parts bathed by patient: Right arm, Left arm, Chest, Abdomen, Front perineal area, Buttocks, Right upper leg, Left upper leg, Face   Body parts bathed by helper: Left lower leg, Right lower leg     Bathing assist Assist Level: Minimal Assistance - Patient > 75%     Upper Body Dressing/Undressing Upper body dressing   What is the patient wearing?: Hospital gown only    Upper body assist Assist Level: Supervision/Verbal cueing    Lower Body Dressing/Undressing Lower body dressing      What is the patient wearing?: Pants     Lower body assist Assist for lower body dressing: Minimal Assistance - Patient > 75%     Toileting Toileting    Toileting assist Assist for  toileting: Contact Guard/Touching assist     Transfers Chair/bed transfer  Transfers assist     Chair/bed transfer assist level: Contact Guard/Touching assist     Locomotion Ambulation   Ambulation assist      Assist level: Contact Guard/Touching assist Assistive device: Walker-rolling Max distance: 150'   Walk 10 feet activity   Assist     Assist level: Contact Guard/Touching assist Assistive device: Walker-rolling   Walk 50 feet activity   Assist    Assist level: Contact Guard/Touching assist Assistive device: Walker-rolling    Walk 150 feet activity   Assist    Assist level: Contact Guard/Touching assist Assistive device: Walker-rolling    Walk 10 feet on uneven surface  activity   Assist Walk 10 feet on uneven surfaces activity did not  occur: Safety/medical concerns         Wheelchair     Assist Is the patient using a wheelchair?: No             Wheelchair 50 feet with 2 turns activity    Assist            Wheelchair 150 feet activity     Assist          Blood pressure 128/76, pulse 74, temperature 98.3 F (36.8 C), resp. rate 16, height 5' (1.524 m), weight 68.1 kg, SpO2 100 %.  Medical Problem List and Plan: 1. Functional deficits secondary to debility secondary to peripheral vascular disease with debilitating claudication.  Status post bilateral endarterectomies with stent placement 03/17/2022 per Dr. Leotis Pain complicated by right groin wound infection status post right femoral exposure washout debridement redo patch plasty with ipsilateral GSV 03/30/2022.  Wound VAC is indicated x7 days through 04/06/2022             -patient may not shower due to [facility]             -ELOS/Goals: 7-10d  First day of evaluations- con't PT and OT  -Est discharge 6/23 2.  Antithrombotics: -DVT/anticoagulation:  Mechanical: Antiembolism stockings, thigh (TED hose) Bilateral lower extremities             -antiplatelet therapy: Aspirin 81 mg daily and Plavix 75 mg daily 3. Pain Management: Oxycodone as needed, pt hesistent to take meds for pain   6/13- add IV morphine 1 mg for VAC changes 4. Mood: Pamelor 10 mg twice daily.  Provide emotional support             -antipsychotic agents: N/A 5. Neuropsych: This patient is capable of making decisions on her own behalf. 6. Skin/Wound Care: Routine skin checks 7. Fluids/Electrolytes/Nutrition: Routine in and outs with follow-up chemistries 8.  ID.  Currently maintained on Zosyn for right groin wound infection x6 weeks.  Right groin wound culture rare Pseudomonas aeruginosa.   6/13- will need PICC- order was placed- verify placed  6/14 PICC replaced  9.  Postoperative cardiopulmonary arrest/non-STEMI with history of CAD/CABG.  Patient did receive CPR x2 minutes.   Follow-up cardiology service.  Continue Ranexa 500 mg twice daily.  Plan outpatient follow-up cardiology services 10.  Acute on chronic anemia.  Continue iron supplement.    -6/14 HGB stable at 8.9 11.  CKD stage III.  Baseline creatinine 1.42-1.56.  Latest creatinine 2.11 follow-up chemistries  6/13- Cr 1.32- doing well and BUN 10- con't to monitor 12.  Diastolic congestive heart failure.  Monitor for any signs of fluid overload 13.  Hypertension.  Normodyne 300 mg twice daily,Norvasc  10 mg daily just started 04/05/22, SBP worsened by pain.Enc pain med use.  Monitor with increased mobility  6/13- just added Norvasc back yesterday- 730 systolic this AM, but just got Am meds- monitor closely and titrate meds as required  6/14 BP appears to be improved today and yesterday, follow trend 14.  Hyperlipidemia.  Zetia 10 mg daily.  2 days A FACE TO FACE EVALUATION WAS PERFORMED  Jennye Boroughs 04/07/2022, 7:59 AM

## 2022-04-08 ENCOUNTER — Encounter (HOSPITAL_COMMUNITY): Payer: Self-pay | Admitting: Physical Medicine and Rehabilitation

## 2022-04-08 DIAGNOSIS — I25810 Atherosclerosis of coronary artery bypass graft(s) without angina pectoris: Secondary | ICD-10-CM

## 2022-04-08 DIAGNOSIS — R52 Pain, unspecified: Secondary | ICD-10-CM

## 2022-04-08 MED ORDER — ONDANSETRON HCL 4 MG PO TABS
4.0000 mg | ORAL_TABLET | Freq: Three times a day (TID) | ORAL | Status: DC | PRN
  Administered 2022-04-08 – 2022-04-12 (×3): 4 mg via ORAL
  Filled 2022-04-08 (×3): qty 1

## 2022-04-08 NOTE — Progress Notes (Signed)
Occupational Therapy Session Note  Patient Details  Name: Sabrina [name] MRN: [id number] Date of Birth: 1941/04/19  Today's Date: 04/08/2022 OT Individual Time: 2158-7276 OT Individual Time Calculation (min): 70 min    Short Term Goals: Week 1:  OT Short Term Goal 1 (Week 1): STG=LTG (Due to ELOS)  Skilled Therapeutic Interventions/Progress Updates:    Pt resting in bed upon arrival. OT intervention with focus on bed mobility, standing balance, and functional amb with RW to increase independence with BADLs. Supine>sit EOB with min A. Pt with increased pain with transitional movments. Sit>stand from EOB with CGA. Pt amb 168' with RW and supervision. Pt engaged in standing activities at BITS x 4. Standing balance with CGA without BUE support. No LOB. Pt amb 172' with RW and 287' with RW-seated rest break. Pt returned to recliner. Wound Vac with no drainage. Seal Check revealed no leakage. Power light did not come on when plugged in. Wound vac operating on battery even though plugged into red receptacle. RN Jarrett Soho notified. Pt remained in recliner with all needs within reach.   Therapy Documentation Precautions:  Precautions Precautions: Fall, Other (comment) Precaution Comments: R groin wound vac Restrictions Weight Bearing Restrictions: No Pain: Pt reports discomfort with all transitional movements but denies pain when ambulating    Therapy/Group: Individual Therapy  Leroy Libman 04/08/2022, 11:59 AM

## 2022-04-08 NOTE — Progress Notes (Signed)
Patient ID: Sabrina [name], female   DOB: 06-Feb-1941, 81 y.o.   MRN: [id number]  SW met with pt in room to provide updates from team conference, and d/c date 6/23. SW discussed with pt discharging with wound vac and IV abx. Pt reports concerns about cost for IV abx and limited income. SW shared will have liaison review and see if any potential costs.   SW spoke with Pam Chandler/[facility] to discuss referral. Reports will review referral and will f/u about costs. SW sent referral to [facility] to discuss reviewing referral for wound vac.    Loralee Pacas, MSW, [place] Office: [phone] Cell: [phone] Fax: [phone]

## 2022-04-08 NOTE — Progress Notes (Signed)
Physical Therapy Session Note  Patient Details  Name: Sabrina [name] MRN: [id number] Date of Birth: 1941/10/05  Today's Date: 04/08/2022 PT Individual Time: 4765-4650; 3546-5681 PT Individual Time Calculation (min): 60 min and 60 min  Short Term Goals: Week 1:  PT Short Term Goal 1 (Week 1): =LTG due to ELOS  Skilled Therapeutic Interventions/Progress Updates:    Session 1: Pt received seated in recliner in room, agreeable to PT session. Pt reports ongoing pain in R groin at site of wound vac. Pt reports being premedicated prior to start of therapy session. Sit to stand and transfers with RW and close Supervision to CGA during session. 6MWT, pt ambulates x 178 ft with RW and close Supervision to CGA for balance, requires 2 min seated rest break at end of test. Pt reports being more limited by pain than endurance this AM. Ambulation x 180 ft with RW and CGA. Standing alt L/R 3" step-ups with 2 handrails and CGA for balance for LE strengthening, 3 x 15 reps. Standing RLE 3" step-taps with 2 handrails and CGA for balance for hip strengthening, 3 x 15 reps. Standing balance with no AD and CGA performing ball toss against rebounder, 3 x 15-20 reps to fatigue. Pt returned to room and left seated in recliner with quick release belt in place, needs in reach at end of session.  Session 2: Pt received seated in recliner in room, agreeable to PT session. Pt reports ongoing pain in R groin at site of wound vac, declines intervention. Sit to stand with Supervision to RW during session. Ambulation x 250 ft with RW and CGA for balance. Nustep level 3 x 10 min with use of B UE/LE for global endurance training with focus on increasing R hip and knee ROM to tolerance. Sidsteps L/R 3 x 10 ft each direction with RW and close Supervision for balance for hip strengthening. Standing alt L/R HS curls 2 x 10 reps with RW and Supervision for balance. Ambulation x 200 ft with RW and CGA. Pt returned to bed at end of  session, Supervision for bed mobility. Pt left seated in bed with needs in reach, bed alarm in place.  Therapy Documentation Precautions:  Precautions Precautions: Fall, Other (comment) Precaution Comments: R groin wound vac Restrictions Weight Bearing Restrictions: No       Therapy/Group: Individual Therapy   Excell Seltzer, PT, DPT, CSRS 04/08/2022, 12:10 PM

## 2022-04-08 NOTE — IPOC Note (Signed)
Overall Plan of Care [facility]) Patient Details Name: Sabrina [name] MRN: [id number] DOB: Dec 28, 1940  Admitting Diagnosis: Debility  Hospital Problems: Principal Problem:   Debility     Functional Problem List: Nursing Edema, Endurance, Medication Management, Motor, Safety, Skin Integrity  PT Endurance, Pain, Safety, Sensory, Skin Integrity  OT Balance, Motor, Pain, Safety  SLP    TR         Basic ADL's: OT Grooming, Bathing, Dressing, Toileting     Advanced  ADL's: OT       Transfers: PT Bed Mobility, Bed to Chair, Car, [employer], Floor  OT Toilet, Metallurgist: PT Ambulation, Emergency planning/management officer, Stairs     Additional Impairments: OT Fuctional Use of Upper Extremity  SLP        TR      Anticipated Outcomes Item Anticipated Outcome  Self Feeding    Swallowing      Basic self-care  mod i  Toileting  modi   Bathroom Transfers modi  Bowel/Bladder  n/a  Transfers  mod I  Locomotion  mod I at ambulatory level with LRAD  Communication     Cognition     Pain  n/a  Safety/Judgment  mod i   Therapy Plan: PT Intensity: Minimum of 1-2 x/day ,45 to 90 minutes PT Frequency: 5 out of 7 days PT Duration Estimated Length of Stay: 7-10 days OT Intensity: Minimum of 1-2 x/day, 45 to 90 minutes OT Frequency: 5 out of 7 days OT Duration/Estimated Length of Stay: ~7-9 days     Team Interventions: Nursing Interventions Patient/Family Education, Disease Management/Prevention, Medication Management, Skin Care/Wound Management, Discharge Planning  PT interventions Ambulation/gait training, Training and development officer, Community reintegration, Discharge planning, Disease management/prevention, DME/adaptive equipment instruction, Functional mobility training, Pain management, Patient/family education, Stair training, Therapeutic Activities, Therapeutic Exercise, UE/LE Strength taining/ROM, UE/LE Coordination activities  OT Interventions  Balance/vestibular training, Disease mangement/prevention, Neuromuscular re-education, Self Care/advanced ADL retraining, Therapeutic Exercise, Cognitive remediation/compensation, DME/adaptive equipment instruction, Pain management, Skin care/wound managment, UE/LE Strength taining/ROM, Community reintegration, Technical sales engineer stimulation, Patient/family education, UE/LE Coordination activities, Discharge planning, Functional mobility training, Psychosocial support, Therapeutic Activities  SLP Interventions    TR Interventions    SW/CM Interventions Discharge Planning, Psychosocial Support, Patient/Family Education   Barriers to Discharge MD  Medical stability, Home enviroment access/loayout, and Wound care  Nursing Decreased caregiver support, IV antibiotics, Wound Care, Lack of/limited family support Lives alone, 1 level, level entry. Daughter in from [place] to assist at discharge. Daughter will eventually return to [place].  PT      OT Decreased caregiver support, Wound Care, Lack of/limited family support    SLP      SW       Team Discharge Planning: Destination: PT-Home ,OT- Home , SLP-  Projected Follow-up: PT-Home health PT, OT-  Home health OT, SLP-  Projected Equipment Needs: PT-Rolling walker with 5" wheels, OT- To be determined, SLP-  Equipment Details: PT-per pt report she already owns RW, OT-  Patient/family involved in discharge planning: PT- Patient,  OT-Patient, Family member/caregiver, SLP-   MD ELOS: 7-10d Medical Rehab Prognosis:  Good Assessment: The patient has been admitted for CIR therapies with the diagnosis of debility secondary to peripheral vascular disease with debilitating claudication. The team will be addressing functional mobility, strength, stamina, balance, safety, adaptive techniques and equipment, self-care, bowel and bladder mgt, patient and caregiver education. Goals have been set at Mod I. Anticipated discharge destination is  Home.  See Team Conference Notes for weekly updates to the plan of care

## 2022-04-08 NOTE — Progress Notes (Signed)
PROGRESS NOTE   Subjective/Complaints:  Pt reports she is not happy with the food in the hospital. Doesn't want supplements.    ROS:  Pt denies Fever, chills, SOB, abd pain, HA, CP, N/V/C/D  Objective:   No results found. Recent Labs    04/06/22 0508  WBC 8.7  HGB 8.9*  HCT 27.2*  PLT 381    Recent Labs    04/06/22 0508  NA 137  K 3.7  CL 107  CO2 22  GLUCOSE 97  BUN 10  CREATININE 1.32*  CALCIUM 8.7*     Intake/Output Summary (Last 24 hours) at 04/08/2022 1031 Last data filed at 04/07/2022 2054 Gross per 24 hour  Intake 472 ml  Output 200 ml  Net 272 ml         Physical Exam: Vital Signs Blood pressure 120/80, pulse 90, temperature 98.2 F (36.8 C), temperature source Oral, resp. rate 18, height 5' (1.524 m), weight 70.6 kg, SpO2 100 %.   General: awake, alert, appropriate; supine in bed; , NAD, sitting in chair HENT: conjugate gaze; oropharynx moist CV: regular rate; no JVD Pulmonary: CTA B/L, non-labored GI: soft, NT, ND, (+)BS- slightly hypoactive Psychiatric: appropriate, pleasant  Neurological: Ox3 Neurologic: Cranial nerves II through XII intact, motor strength is 5/5 in bilateral deltoid, bicep, tricep, grip, 3- Bilateral hip flexor, knee extensors, 4/5 ankle dorsiflexor and plantar flexor Sensory exam normal sensation to light touch and proprioception in bilateral upper and lower extremities Cerebellar exam normal finger to nose to finger as well as heel to shin in bilateral upper and lower extremities Musculoskeletal: pain with B Hip ROM, no UE swelling or joint deformity, Neg knee or ankle effusion , mild tenderness right thigh Feet , s/p Right 5th toe amp well healed.  No other deformity , no skin breakdown  Skin- VAC on R groin- no redness seen  Assessment/Plan: 1. Functional deficits which require 3+ hours per day of interdisciplinary therapy in a comprehensive inpatient rehab  setting. Physiatrist is providing close team supervision and 24 hour management of active medical problems listed below. Physiatrist and rehab team continue to assess barriers to discharge/monitor patient progress toward functional and medical goals  Care Tool:  Bathing    Body parts bathed by patient: Right arm, Left arm, Chest, Abdomen, Front perineal area, Buttocks, Right upper leg, Left upper leg, Face   Body parts bathed by helper: Left lower leg, Right lower leg     Bathing assist Assist Level: Minimal Assistance - Patient > 75%     Upper Body Dressing/Undressing Upper body dressing   What is the patient wearing?: Hospital gown only    Upper body assist Assist Level: Supervision/Verbal cueing    Lower Body Dressing/Undressing Lower body dressing      What is the patient wearing?:  (mesh panties)     Lower body assist Assist for lower body dressing: Minimal Assistance - Patient > 75%     Toileting Toileting    Toileting assist Assist for toileting: Contact Guard/Touching assist     Transfers Chair/bed transfer  Transfers assist     Chair/bed transfer assist level: Contact Guard/Touching assist     Locomotion  Ambulation   Ambulation assist      Assist level: Contact Guard/Touching assist Assistive device: Walker-rolling Max distance: 150'   Walk 10 feet activity   Assist     Assist level: Contact Guard/Touching assist Assistive device: Walker-rolling   Walk 50 feet activity   Assist    Assist level: Contact Guard/Touching assist Assistive device: Walker-rolling    Walk 150 feet activity   Assist    Assist level: Contact Guard/Touching assist Assistive device: Walker-rolling    Walk 10 feet on uneven surface  activity   Assist Walk 10 feet on uneven surfaces activity did not occur: Safety/medical concerns         Wheelchair     Assist Is the patient using a wheelchair?: No             Wheelchair 50 feet  with 2 turns activity    Assist            Wheelchair 150 feet activity     Assist          Blood pressure 120/80, pulse 90, temperature 98.2 F (36.8 C), temperature source Oral, resp. rate 18, height 5' (1.524 m), weight 70.6 kg, SpO2 100 %.  Medical Problem List and Plan: 1. Functional deficits secondary to debility secondary to peripheral vascular disease with debilitating claudication.  Status post bilateral endarterectomies with stent placement 03/17/2022 per Dr. Leotis Pain complicated by right groin wound infection status post right femoral exposure washout debridement redo patch plasty with ipsilateral GSV 03/30/2022.  Wound VAC is indicated x7 days through 04/06/2022             -patient may not shower due to [facility]             -ELOS/Goals: 7-10d  First day of evaluations- con't PT and OT  -Est discharge 6/23, mod I goals 2.  Antithrombotics: -DVT/anticoagulation:  Mechanical: Antiembolism stockings, thigh (TED hose) Bilateral lower extremities             -antiplatelet therapy: Aspirin 81 mg daily and Plavix 75 mg daily 3. Pain Management: Oxycodone as needed, pt hesistent to take meds for pain   6/13- add IV morphine 1 mg for VAC changes  6/15 pain controlled with current medications, continue to monitor 4. Mood: Pamelor 10 mg twice daily.  Provide emotional support             -antipsychotic agents: N/A 5. Neuropsych: This patient is capable of making decisions on her own behalf. 6. Skin/Wound Care: Routine skin checks 7. Fluids/Electrolytes/Nutrition: Routine in and outs with follow-up chemistries 8.  ID.  Currently maintained on Zosyn for right groin wound infection x6 weeks.  Right groin wound culture rare Pseudomonas aeruginosa.   6/13- will need PICC- order was placed- verify placed  6/14 PICC replaced  9.  Postoperative cardiopulmonary arrest/non-STEMI with history of CAD/CABG.  Patient did receive CPR x2 minutes.  Follow-up cardiology service.  Continue Ranexa  500 mg twice daily.  Plan outpatient follow-up cardiology services  -Denies chest pain 10.  Acute on chronic anemia.  Continue iron supplement.    -6/14 HGB stable at 8.9 11.  CKD stage III.  Baseline creatinine 1.42-1.56.  Latest creatinine 2.11 follow-up chemistries  6/13- Cr 1.32- doing well and BUN 10- con't to monitor 12.  Diastolic congestive heart failure.  Monitor for any signs of fluid overload 13.  Hypertension.  Normodyne 300 mg twice daily,Norvasc 10 mg daily just started 04/05/22, SBP worsened by pain.Enc  pain med use.  Monitor with increased mobility  6/13- just added Norvasc back yesterday- 595 systolic this AM, but just got Am meds- monitor closely and titrate meds as required  6/15 BP well controlled , continue 14.  Hyperlipidemia.  Zetia 10 mg daily.  3 days A FACE TO FACE EVALUATION WAS PERFORMED  Jennye Boroughs 04/08/2022, 10:31 AM

## 2022-04-08 NOTE — Consult Note (Addendum)
[facility] Nurse Consult Note: Reason for Consult:Notified by Bedside RN that NPWT device was leaking. Several attempts had been made to "patch" dressing. Wound type:Surgical Pressure Injury POA: N/A Measurement:per Tuesday Wound bed:red, moist Drainage (amount, consistency, odor) serous Periwound: with several puckers and crevices in periwound area, moist Dressing procedure/placement/frequency: Patient is premedicated for pain.  No nausea experienced and no headache reported.  NPWT dressing (drape plus 1 piece of black foam) removed using adhesive remover spray; skin cleansed and dried. Strips of skin barrier ring used to fill defects at periwound creases and one ring applied to periwound.  One (1) piece of black foam used to cover wound, this is covered with drape. One piece of black foam used as "bridge" to right thigh and placed upon drape, then covered with drape.  T.R.A.C.C. pad is applied to foam at thigh and dressing attached to 121mmHg continuous negative pressure. An immediate seal is achieved. Cannister is changed today.  Patient tolerated procedure well.  Dr. Virl Cagey is notified via Secure Chat that the dressing was changed today (instead of tomorrow) and that the next scheduled dressing change would be Monday, June 19.  Supplies ordered to the room for that and the next dressing change (3 small vac dressing kits, 4 skin barrier rings).  [facility] nursing team will follow, and will remain available to this patient, the nursing and medical teams.    Thank you for inviting [place] to participate in this patient's Plan of Care.  Maudie Flakes, MSN, RN, CNS, [place], Serita Grammes, [employer], [employer] phone:  [phone]

## 2022-04-08 NOTE — Progress Notes (Signed)
Occupational Therapy Session Note  Patient Details  Name: Sabrina [name] MRN: [id number] Date of Birth: [birth year]  Today's Date: 04/08/2022 OT Individual Time: 0800-0830 OT Individual Time Calculation (min): 30 min    Short Term Goals: Week 1:  OT Short Term Goal 1 (Week 1): STG=LTG (Due to ELOS)  Skilled Therapeutic Interventions/Progress Updates:    S:  Pt in bathroom with Nurse Tech upon therapy arrival. Reports that she has pain/discomfort in her groin. It increases with activity. Reports she is not eating much.   Pt participated in ADL re-training session with adaptive equipment education of using long handled reacher to assist with donning scrub pants. Therapist provided verbal cues and visual demonstration of task prior to patient demonstrating understanding. Education provided on pain management, requesting pain medication when needed/30 minutes before therapy session in order to keep it at a manageable level. Discussed how a pain level of 5 or greater is harder to bring down and manage. Patient verbalized understanding.    P:  Have patient practice using sock aid to increase independence with lower body dressing.   Therapy Documentation Precautions:  Precautions Precautions: Fall, Other (comment) Precaution Comments: R groin wound vac Restrictions Weight Bearing Restrictions: No  Pain:  No number provided. Verbalized pain/discomfort in groin area. At end of session, call light pushed by patient to request pain medication.    Therapy/Group: Individual Therapy Ailene Ravel, OTR/L,CBIS  Supplemental OT - [facility]  04/08/2022, 7:57 AM

## 2022-04-09 DIAGNOSIS — I739 Peripheral vascular disease, unspecified: Secondary | ICD-10-CM

## 2022-04-09 DIAGNOSIS — E876 Hypokalemia: Secondary | ICD-10-CM

## 2022-04-09 DIAGNOSIS — N183 Chronic kidney disease, stage 3 unspecified: Secondary | ICD-10-CM

## 2022-04-09 LAB — BASIC METABOLIC PANEL
Anion gap: 10 (ref 5–15)
BUN: 13 mg/dL (ref 8–23)
CO2: 26 mmol/L (ref 22–32)
Calcium: 8.7 mg/dL — ABNORMAL LOW (ref 8.9–10.3)
Chloride: 105 mmol/L (ref 98–111)
Creatinine, Ser: 1.44 mg/dL — ABNORMAL HIGH (ref 0.44–1.00)
GFR, Estimated: 37 mL/min — ABNORMAL LOW (ref >60)
Glucose, Bld: 103 mg/dL — ABNORMAL HIGH (ref 70–99)
Potassium: 3.1 mmol/L — ABNORMAL LOW (ref 3.5–5.1)
Sodium: 141 mmol/L (ref 135–145)

## 2022-04-09 MED ORDER — POTASSIUM CHLORIDE CRYS ER 20 MEQ PO TBCR
20.0000 meq | EXTENDED_RELEASE_TABLET | Freq: Two times a day (BID) | ORAL | Status: AC
Start: 2022-04-09 — End: 2022-04-09
  Administered 2022-04-09 (×2): 20 meq via ORAL
  Filled 2022-04-09 (×2): qty 1

## 2022-04-09 NOTE — Progress Notes (Signed)
Physical Therapy Session Note  Patient Details  Name: Sabrina [name] MRN: [id number] Date of Birth: 1941/04/12  Today's Date: 04/09/2022 PT Individual Time: 1023-1048 PT Individual Time Calculation (min): 25 min   Short Term Goals: Week 1:  PT Short Term Goal 1 (Week 1): =LTG due to ELOS   Skilled Therapeutic Interventions/Progress Updates:   Pt received sitting in recliner and agreeable to PT. Upper body and lower body dressing sitting at [facility] with mod assist for shirt management to don gown on back side and max assist for pants management to pull to waist while Pt stabilized wound vac cord. Pt performed sit<>stand transfer with RW and supervision assist from PT for safety.    Gait in hall  x 165ft with supervision assist from PT with cues for doorway management. No LOB noted. Pt reports feeling secure without AD.   PT instructed pt in TUG: 34 sec with RW 28.5 sec without AD average of 2 trials. (>13.5 sec indicates increased fall risk)  Patient returned to room and left sitting in [facility] with call bell in reach and all needs met.        Therapy Documentation Precautions:  Precautions Precautions: Fall, Other (comment) Precaution Comments: R groin wound vac Restrictions Weight Bearing Restrictions: No General:   Vital Signs:  Pain: Pain Assessment Pain Scale: 0-10 Pain Score: 8  Pain Location: Groin Pain Orientation: Right Pain Descriptors / Indicators: Aching Pain Intervention(s): Medication (See eMAR) Mobility:   Locomotion :    Trunk/Postural Assessment :    Balance:   Exercises:   Other Treatments:      Therapy/Group: Individual Therapy  Lorie Phenix 04/09/2022, 11:02 AM

## 2022-04-09 NOTE — Progress Notes (Signed)
Occupational Therapy Session Note  Patient Details  Name: Sabrina [name] MRN: [id number] Date of Birth: Jul 21, 1941  Today's Date: 04/09/2022 OT Individual Time: 1586-8257 OT Individual Time Calculation (min): 70 min    Short Term Goals: Week 1:  OT Short Term Goal 1 (Week 1): STG=LTG (Due to ELOS)  Skilled Therapeutic Interventions/Progress Updates:    OT intervention with focus on standing balance, functional amb with and without RW, toileting and transfers, and activity tolerance to increase independence with BADLs. Pt amb with RW 217', 167', and 259'. Pt amb in gym wihtout AD and navigated around obstacles on floor with CGA. Pt amb without AD 105' with CGA. Pt returned to room and requested to use toilet. Transfers with supervision and toileting with CGA. Pt returned to recliner. RN attending to wound vac.   Therapy Documentation Precautions:  Precautions Precautions: Fall, Other (comment) Precaution Comments: R groin wound vac Restrictions Weight Bearing Restrictions: No   Pain: Pt c/o increased discomfort at wound vac site. Pt reports she thought it might be pullng. RN notified and attended to pt at end of session.   Therapy/Group: Individual Therapy  Leroy Libman 04/09/2022, 12:02 PM

## 2022-04-09 NOTE — Progress Notes (Signed)
Occupational Therapy Session Note  Patient Details  Name: Louanne MELONDY BLANCHARD MRN: [id number] Date of Birth: Mar 10, 1941  Today's Date: 04/09/2022 OT Individual Time: 1345-1425 OT Individual Time Calculation (min): 40 min    Short Term Goals: Week 1:  OT Short Term Goal 1 (Week 1): STG=LTG (Due to ELOS)  Skilled Therapeutic Interventions/Progress Updates:    Pt resting in recliner upon arrival and ready for therapy. OT intervention with focus on functional amb with/without AD, standing balance on compliant surface (Airex), and activity tolerance to increase independence with BADLs. Pt amb with RW to ortho gym with supervision. Pt completed 3 tasks standing on Airex at BITS-visual scanning, sequencing, and bell cancellation. Pt completed all 3 tasks without UE support and CGA. Pt returned to room and returned to recliner. Pt remained in recliner with all needs within reach.  Therapy Documentation Precautions:  Precautions Precautions: Fall, Other (comment) Precaution Comments: R groin wound vac Restrictions Weight Bearing Restrictions: No  Pain: Pain Assessment Pain Score: 0-No pain  Therapy/Group: Individual Therapy  Leroy Libman 04/09/2022, 2:31 PM

## 2022-04-09 NOTE — Progress Notes (Signed)
Patient ID: Sabrina [name], female   DOB: [birth year], 81 y.o.   MRN: [id number]  Referral accepted by [facility]. Reports pt will only pay $4.97 per day for IV abx. HHA to assist with care will be [name].   Loralee Pacas, MSW, [place] Office: [phone] Cell: [phone] Fax: [phone]

## 2022-04-09 NOTE — Consult Note (Signed)
Neuropsychological Consultation   Patient:   Sabrina [name]   DOB:   1941-01-05  MR Number:  [id number]  Location:  [place] [address] [place] [id number] [place] [id number] Dept: [place]: [phone]           Date of Service:   04/09/2022  Start Time:   8 AM End Time:   9 AM  Provider/Observer:  Ilean Skill, Psy.D.       Clinical Neuropsychologist       Billing Code/Service: [phone]  Chief Complaint:    Sabrina [name] is an 81 year old female with significant past vascular disease including CAD/PAD.  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  Patient referred to CIR due to debility and long recovery process.  Reason for Service:  Patient referred for neuropsychological consultation due to coping and adjustment issues with extended hospital stay, cerebrovascular and peripheral arterial disease with recent vascular surgery.  Below is the HPI for the current admission.  HPI: Sabrina [name] is an 81 year old right-handed female with history of obesity BMI 30.53, chronic hip pain, chronic anemia, CAD/CABG x3 with LIMA-LAD,SVG-ramus intermedius 03/2000 with recent heart catheterization 11/18/2021 showing extensive native CAD and 100% occlusion SVG-RCA and 90%, distal SVG-OM with significant stenosis of her left subclavian status post left subclavian stenting 11/23/2021 maintained on low-dose aspirin, diastolic congestive heart failure, carotid artery angioplasty 2004, CKD stage III, hypertension, hyperlipidemia, peripheral vascular disease with multiple interventions in the past.  Per chart review lives alone.  1 level home.  Family in area with good support.  Presented to [facility] 03/17/2022 for elective bilateral superior femoral artery and left iliac stenting with vascular surgery due to peripheral vascular disease and  debilitating claudication with some mild rest pain.  Underwent left common femoral, profunda femoris and superficial femoral artery endarterectomies as well as right common femoral profunda femoris and superficial femoral artery enterectomy's stent placement to left external iliac artery with 8 mm diameter by 4 cm length of life star stent 03/17/2022 per Dr. Leotis Pain.  Postoperative acute respiratory failure cardiac arrest requiring CPR x2 minutes.  Found to have markedly elevated troponin of 15,000.  She did require intubation for airway protection and extubated same day.  Echocardiogram with ejection fraction of 60 to 65% no wall motion abnormality.  Maintained on aspirin and Plavix for least 12 months and then plan to discuss outpatient cardiac catheterization.  Ranexa ongoing at 500 mg twice daily per cardiology service Dr. Lujean Amel.  Ongoing wound care bilateral groin dressings with noted drainage wound VAC of been removed 03/22/2022 wound cultures pending.  Tolerating a regular diet.  Acute on chronic anemia 8.3 monitor transfused 5/27 1 unit packed red blood cells.  Therapy evaluations completed patient was admitted for a comprehensive rehab program 03/24/2022.  Patient was slow but progressive gains with functional mobility.  Patient with persistent drainage from right groin wound she was covered with Keflex for recent wound cultures.  Follow-up chemistries noted leukocytosis 15,600 with hemoglobin 9.5, chemistries unremarkable except BUN 24 creatinine 1.91, blood cultures no growth to date, surgical PCR screening positive.  Vascular surgery consulted 03/29/2022 and felt debridement was needed.  Patient was discharged to acute care services 03/29/2022 and underwent incision and debridement of right groin wound reexposure of the common femoral artery common femoral artery patch excision common femoral patch plasty using ipsilateral greater saphenous vein and wound  VAC placement 03/30/2022 per Dr. Orlie Pollen  through 04/06/2022.  Patient's aspirin and Plavix ongoing as prior to surgical intervention.  Currently maintained on Zosyn for wound coverage and awaiting plan of duration.  Therapy evaluations resumed and patient was admitted back to inpatient rehab services to continue comprehensive intensive rehab therapies.  Current Status:  Patient was awake and alert sitting slightly elevated in her bed when I entered the room.  She was oriented but presented in a dysphoric mood but denied significant depression or anxiety.  She did address several issues of concern that she had both practical as well as issues inducing stress.  Patient has misplaced her dentures and thinks that this happened on this unit.  Patient not particularly happy with food selections available and at the very practical day-to-day issues.  Patient has been working on preparatory efforts for ADL directed therapies.  Behavioral Observation: Sabrina [name]  presents as a 81 y.o.-year-old Right handed African American Female who appeared her stated age. her dress was Appropriate and she was Well Groomed and her manners were Appropriate to the situation.  her participation was indicative of Appropriate behaviors.  There were physical disabilities noted.  she displayed an appropriate level of cooperation and motivation.     Interactions:    Active Redirectable  Attention:   within normal limits and attention span and concentration were age appropriate  Memory:   within normal limits; recent and remote memory intact  Visuo-spatial:  not examined  Speech (Volume):  normal  Speech:   normal; normal  Thought Process:  Coherent and Relevant  Though Content:  WNL; not suicidal and not homicidal  Orientation:   person, place, time/date, and situation  Judgment:   Good  Planning:   Fair  Affect:    Irritable  Mood:    Dysphoric  Insight:   Good  Intelligence:   high  Medical History:   Past Medical History:  Diagnosis Date    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 b.) TTE 11/16/2021: EF >55%; mild AS (MPG 9 mmHg)   CAD (coronary artery disease)    a.) s/p 3v CABG 03/29/2000   Carotid artery stenosis    a.) s/p LEFT CEA 09/09/2003. b.) Carotid doppler 98/92/1194: 1-74% LICA, CTO RICA; subclavian stenosis   Cataracts, bilateral    Cervical spondylosis without myelopathy    Chronic diastolic CHF (congestive heart failure), NYHA class 3 (HCC)    a.) TTE 05/27/2016: EF >55%, mild LA enlargement, triv PR, mild MR, mod TR; G3DD. b.) TTE 12/12/2017: EF >55%, mild LVH, BAE, mild MR/PR, mod TR; RVSP 52.8 mmHg. c.) TTE 03/18/2020: EF >55%, BAD, AS (MPG 8.7 mmHg); triv MR, mild TR/PR. d.) TTE 11/16/2021: EF >55%, LVH, G1DD, triv MR, mild PR, mod TR; AS (MPG 9 mmHg); MS (MPG 5 mmHg)   Chronic kidney disease, stage III (moderate) (HCC)    Chronic narcotic use 06/24/2014   Chronic pain syndrome    GERD (gastroesophageal reflux disease)    History of 2019 novel coronavirus disease (COVID-19) 09/22/2021   Hyperlipidemia    Hypertension    Long term current  use of antithrombotics/antiplatelets    a.) on daily DAPT therapy (ASA + clopidogrel)   Lumbar stenosis with neurogenic claudication    Mitral stenosis 11/16/2021   a.) TTE 11/16/2021: EF >55%; mod MS (MPG 5 mmHg)   Osteoarthritis of hip    Pulmonary hypertension ([place]) 12/12/2017   a.) TTE 12/12/2017: mild; RVSP 52.8 mmHg   PVD (peripheral vascular disease) ([place])    Renal artery stenosis (HCC)    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         Patient Active Problem List   Diagnosis Date Noted   Acute blood loss anemia 03/30/2022   Myocardial infarction due to demand ischemia ([place]) 03/30/2022   Postoperative wound infection 03/29/2022   Acute kidney injury superimposed on chronic kidney disease ([place]) 03/29/2022   NSTEMI (non-ST elevated myocardial infarction) ([place]) 03/24/2022   Anemia of chronic disease 03/24/2022   Acute respiratory failure with hypoxia and hypercapnia ([place]) 03/24/2022   Debility 03/24/2022   PAD (peripheral artery disease) (HCC)    Normocytic anemia    Cardiac arrest ([place])    Respiratory arrest ([place])    Other specified hypothyroidism    Anxiety and depression    Atherosclerosis of artery of extremity with rest pain ([place]) 03/17/2022   LAD (lymphadenopathy), cervical 01/28/2022   Subclavian artery stenosis, left ([place]) 11/20/2021   COVID-19 virus infection 10/04/2021   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, stage III ([place]) 09/62/8366   Lichenified rash 29/47/6546   Insomnia due to anxiety and fear 01/29/2021   Obesity (BMI 30.0-34.9) 11/25/2020   Fatigue 11/25/2020   Chronic kidney disease, stage 3a ([place]) 09/09/2020   Acquired hypothyroidism 07/30/2020   Fatigue due to depression 07/26/2020   Encounter for preventive health examination 05/06/2020   Chronic pain syndrome 03/27/2020   Lumbar facet arthropathy 03/27/2020   Pain due to onychomycosis of toenails of both feet 06/18/2019   Callus 06/18/2019   Ankle swelling 04/22/2019   Educated about COVID-19 virus infection 03/21/2019   Anemia of chronic kidney failure, stage 3 (moderate) ([place]) 02/22/2018   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[place]) 08/04/2017   Aortic aneurysm, abdominal ([place]) 07/05/2017   Chronic breast pain 12/14/2016   Atherosclerosis of renal artery ([place])  09/09/2016   Carotid stenosis 09/09/2016   Palpitations 06/01/2016   Dizziness 12/04/2015   Cervical spondylosis without myelopathy 08/20/2015   Chronic daily headache 07/20/2015   Statin intolerance 05/20/2015   Chronic radicular lumbar pain 12/24/2014   Lumbar stenosis with neurogenic claudication 12/24/2014   GERD (gastroesophageal reflux disease) 12/06/2014   Chronic epigastric pain 11/14/2014   Chronic right hip pain 11/14/2014   Generalized anxiety disorder 09/08/2014   Peripheral vascular disease ([place]) 08/03/2014   CAD (coronary artery disease) 08/03/2014   HLD (hyperlipidemia) 06/24/2014   Need for prophylactic vaccination and inoculation against influenza 06/24/2014   Osteoarthritis of right hip 04/04/2014   Need for vaccination with 13-polyvalent pneumococcal conjugate vaccine 12/28/2013   Aortic valve disorder 12/28/2013   Hip pain, bilateral 12/27/2013   Essential hypertension 12/27/2013   Family Med/Psych History:  Family History  Problem Relation Age of Onset   Stroke Mother    Hypertension Mother    Diabetes Mother    Hypertension Father  Heart disease Sister        MI   Multiple sclerosis Daughter    Multiple sclerosis Son    Cerebral aneurysm Son    Seizures Son    Cerebral aneurysm Son    Breast cancer Paternal Aunt 84    Impression/DX:  Sabrina [name] is an 81 year old female with significant past vascular disease including CAD/PAD.  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  Patient referred to CIR due to debility and long recovery process.  Patient was awake and alert sitting slightly elevated in her bed when I entered the room.  She was oriented but presented in a dysphoric mood but denied significant depression or anxiety.  She did address several issues of concern that she had both practical as well as issues inducing stress.  Patient has misplaced her dentures and  thinks that this happened on this unit.  Patient not particularly happy with food selections available and at the very practical day-to-day issues.  Patient has been working on preparatory efforts for ADL directed therapies.  Diagnosis:    PAD, Debility         Electronically Signed   _______________________ Ilean Skill, Psy.D. Clinical Neuropsychologist

## 2022-04-09 NOTE — Progress Notes (Signed)
PROGRESS NOTE   Subjective/Complaints:  Reports she slept well. No additional concerns. She still does not like hospital food.    ROS:  Pt denies Fever, chills, SOB, abd pain, HA, CP, vision changes  Objective:   No results found. No results for input(s): "WBC", "HGB", "HCT", "PLT" in the last 72 hours.  Recent Labs    04/09/22 0426  NA 141  K 3.1*  CL 105  CO2 26  GLUCOSE 103*  BUN 13  CREATININE 1.44*  CALCIUM 8.7*     Intake/Output Summary (Last 24 hours) at 04/09/2022 1241 Last data filed at 04/08/2022 1300 Gross per 24 hour  Intake 180 ml  Output --  Net 180 ml         Physical Exam: Vital Signs Blood pressure (!) 160/58, pulse 67, temperature 98.2 F (36.8 C), temperature source Oral, resp. rate 15, height 5' (1.524 m), weight 72.7 kg, SpO2 97 %.   General: awake, alert, appropriate; supine in bed; , NAD, sitting in bed HENT: conjugate gaze; oropharynx moist CV: RRR no JVD Pulmonary: CTA B/L, non-labored GI: soft, NT, ND, (+)BS- slightly hypoactive Psychiatric: appropriate Neurological: Ox3 Neurologic: Cranial nerves II through XII intact, motor strength is 5/5 in bilateral deltoid, bicep, tricep, grip, 3- Bilateral hip flexor, knee extensors, 4/5 ankle dorsiflexor and plantar flexor Sensory exam normal sensation to light touch and proprioception in bilateral upper and lower extremities Cerebellar exam normal finger to nose to finger as well as heel to shin in bilateral upper and lower extremities Musculoskeletal: pain with B Hip ROM, no UE swelling or joint deformity, Neg knee or ankle effusion , mild tenderness right thigh Feet , s/p Right 5th toe amp well healed.  No other deformity , no skin breakdown  Skin- VAC on R groin- no redness seen, small amount vac output  Assessment/Plan: 1. Functional deficits which require 3+ hours per day of interdisciplinary therapy in a comprehensive  inpatient rehab setting. Physiatrist is providing close team supervision and 24 hour management of active medical problems listed below. Physiatrist and rehab team continue to assess barriers to discharge/monitor patient progress toward functional and medical goals  Care Tool:  Bathing    Body parts bathed by patient: Right arm, Left arm, Chest, Abdomen, Front perineal area, Buttocks, Right upper leg, Left upper leg, Face   Body parts bathed by helper: Left lower leg, Right lower leg     Bathing assist Assist Level: Minimal Assistance - Patient > 75%     Upper Body Dressing/Undressing Upper body dressing   What is the patient wearing?: Hospital gown only    Upper body assist Assist Level: Supervision/Verbal cueing    Lower Body Dressing/Undressing Lower body dressing      What is the patient wearing?:  (mesh panties)     Lower body assist Assist for lower body dressing: Minimal Assistance - Patient > 75%     Toileting Toileting    Toileting assist Assist for toileting: Contact Guard/Touching assist     Transfers Chair/bed transfer  Transfers assist     Chair/bed transfer assist level: Contact Guard/Touching assist     Locomotion Ambulation   Ambulation assist  Assist level: Contact Guard/Touching assist Assistive device: Walker-rolling Max distance: 178'   Walk 10 feet activity   Assist     Assist level: Contact Guard/Touching assist Assistive device: Walker-rolling   Walk 50 feet activity   Assist    Assist level: Contact Guard/Touching assist Assistive device: Walker-rolling    Walk 150 feet activity   Assist    Assist level: Contact Guard/Touching assist Assistive device: Walker-rolling    Walk 10 feet on uneven surface  activity   Assist Walk 10 feet on uneven surfaces activity did not occur: Safety/medical concerns         Wheelchair     Assist Is the patient using a wheelchair?: No              Wheelchair 50 feet with 2 turns activity    Assist            Wheelchair 150 feet activity     Assist          Blood pressure (!) 160/58, pulse 67, temperature 98.2 F (36.8 C), temperature source Oral, resp. rate 15, height 5' (1.524 m), weight 72.7 kg, SpO2 97 %.  Medical Problem List and Plan: 1. Functional deficits secondary to debility secondary to peripheral vascular disease with debilitating claudication.  Status post bilateral endarterectomies with stent placement 03/17/2022 per Dr. Leotis Pain complicated by right groin wound infection status post right femoral exposure washout debridement redo patch plasty with ipsilateral GSV 03/30/2022.  Wound VAC is indicated x7 days through 04/06/2022             -patient may not shower due to [facility]             -ELOS/Goals: 7-10d  First day of evaluations- con't PT and OT  -Est discharge 6/23, mod I goals  -Ambulating 157ft with sup assist 2.  Antithrombotics: -DVT/anticoagulation:  Mechanical: Antiembolism stockings, thigh (TED hose) Bilateral lower extremities             -antiplatelet therapy: Aspirin 81 mg daily and Plavix 75 mg daily 3. Pain Management: Oxycodone as needed, pt hesistent to take meds for pain   6/13- add IV morphine 1 mg for VAC changes  6/15 pain controlled with current medications, continue to monitor 4. Mood: Pamelor 10 mg twice daily.  Provide emotional support             -antipsychotic agents: N/A 5. Neuropsych: This patient is capable of making decisions on her own behalf. 6. Skin/Wound Care: Routine skin checks 7. Fluids/Electrolytes/Nutrition: Routine in and outs with follow-up chemistries 8.  ID.  Currently maintained on Zosyn for right groin wound infection x6 weeks.  Right groin wound culture rare Pseudomonas aeruginosa.   6/13- will need PICC- order was placed- verify placed  6/14 PICC replaced  9.  Postoperative cardiopulmonary arrest/non-STEMI with history of CAD/CABG.  Patient did receive  CPR x2 minutes.  Follow-up cardiology service.  Continue Ranexa 500 mg twice daily.  Plan outpatient follow-up cardiology services  -Denies chest pain 10.  Acute on chronic anemia.  Continue iron supplement.    -6/14 HGB stable at 8.9 11.  CKD stage III.  Baseline creatinine 1.42-1.56.  Latest creatinine 2.11 follow-up chemistries  6/13- Cr 1.32- doing well and BUN 10- con't to monitor  -6/16 Cr stable 1.44 , follow 12.  Diastolic congestive heart failure.  Monitor for any signs of fluid overload 13.  Hypertension.  Normodyne 300 mg twice daily,Norvasc 10 mg daily just started 04/05/22,  SBP worsened by pain.Enc pain med use.  Monitor with increased mobility  6/13- just added Norvasc back yesterday- 025 systolic this AM, but just got Am meds- monitor closely and titrate meds as required  6/16 BP a little elevated this AM, overall good control, monitor trend 14.  Hyperlipidemia.  Zetia 10 mg daily. 16. Hypokalemia  -Kdur 91meq BID today, recheck tomorrow  4 days A FACE TO FACE EVALUATION WAS PERFORMED  Jennye Boroughs 04/09/2022, 12:41 PM

## 2022-04-09 NOTE — Progress Notes (Signed)
Pharmacy Antibiotic Note  Sabrina [name] is a 81 y.o. female on day # 74 Zosyn for R groin infection. S/p I&D 6/6. Operative culture grew Pseudomonas. Noted hx itching from PCN, but no report of intolerance to Zosyn. Planning 6 weeks antibiotics.    Zosyn regimen adjusted 6/9 for worsening renal function, then back to standard dosing on 6/11 when renal function back to baseline.  Plan: Continue Zosyn 3.375 gm IV q8h (each over 4 hours) Intermittent bmet.   Height: 5' (152.4 cm) Weight: 72.7 kg (160 lb 4.4 oz) IBW/kg (Calculated) : 45.5  Temp (24hrs), Avg:98.2 F (36.8 C), Min:98 F (36.7 C), Max:98.5 F (36.9 C)  Recent Labs  Lab 04/03/22 0029 04/03/22 1054 04/04/22 0241 04/05/22 0416 04/06/22 0508 04/09/22 0426  WBC 11.1* 10.2 10.9* 9.9 8.7  --   CREATININE 2.06*  --  1.50* 1.30* 1.32* 1.44*    Estimated Creatinine Clearance: 27.7 mL/min (A) (by C-G formula based on SCr of 1.44 mg/dL (H)).    Allergies  Allergen Reactions   Citalopram Anaphylaxis    Throat closing    Dilaudid [Hydromorphone Hcl] Nausea And Vomiting   Hydrochlorothiazide Other (See Comments)    Decreased GFR (Nov 2015)   Liothyronine     Hair fell out, caused headaches    Nsaids     CKD stage III - avoid nephrotoxic drugs   Nubain [Nalbuphine Hcl]     Burning sensation in back   Penicillins Itching   Prasugrel Itching   Statins Itching    Antimicrobials this admission: Ceftriaxone 6/5>>6/6 Metronidazole 6/5>>6/6 Zosyn 6/6>> (7/17?)  Dose adjustments this admission: 6/9: Zosyn to 2.25 gm IV q8h for worsening renal function 6/11: Zosyn back to 3.375 gm IV q8h - renal function back to baseline  Microbiology results: 5/30 Wound culture: moderate Proteus - pansensitive  - and rare Klebsiella - S to all except R Ampicillin 6/5 blood: negative 6/6 R groin tissue: rare Pseudomonas- S to zosyn, Cefepime, Ceftazidime, Cipro, Imipenum 6/6 MRSA PCR: positive for Staph, negative for  MRSA  Thank you for allowing pharmacy to be a part of this patient's care.  Arty Baumgartner, [place] 04/09/2022 3:44 PM

## 2022-04-09 NOTE — Progress Notes (Signed)
Physical Therapy Session Note  Patient Details  Name: Sabrina [name] MRN: [id number] Date of Birth: 1941/03/26  Today's Date: 04/09/2022 PT Individual Time: 1646-1730 PT Individual Time Calculation (min): 44 min   Short Term Goals: Week 1:  PT Short Term Goal 1 (Week 1): =LTG due to ELOS  Skilled Therapeutic Interventions/Progress Updates:   Pt received sitting in WC and agreeable to PT  Gait training in hall.   UBE.   WC mobility.   Pt returned to room and performed ** transfer to bed with **. Sit>supine completed with ** and left supine in bed with call bell in reach and all needs met.       Therapy Documentation Precautions:  Precautions Precautions: Fall, Other (comment) Precaution Comments: R groin wound vac Restrictions Weight Bearing Restrictions: No General:   Vital Signs: Therapy Vitals Temp: 98 F (36.7 C) Temp Source: Oral Pulse Rate: 68 Resp: 16 BP: 140/68 Patient Position (if appropriate): Sitting Oxygen Therapy SpO2: 98 % O2 Device: Room Air Pain:   Mobility:   Locomotion :    Trunk/Postural Assessment :    Balance:   Exercises:   Other Treatments:      Therapy/Group: Individual Therapy  Lorie Phenix 04/09/2022, 6:03 PM

## 2022-04-10 LAB — BASIC METABOLIC PANEL
Anion gap: 8 (ref 5–15)
BUN: 16 mg/dL (ref 8–23)
CO2: 26 mmol/L (ref 22–32)
Calcium: 8.7 mg/dL — ABNORMAL LOW (ref 8.9–10.3)
Chloride: 104 mmol/L (ref 98–111)
Creatinine, Ser: 1.52 mg/dL — ABNORMAL HIGH (ref 0.44–1.00)
GFR, Estimated: 34 mL/min — ABNORMAL LOW (ref >60)
Glucose, Bld: 101 mg/dL — ABNORMAL HIGH (ref 70–99)
Potassium: 3.5 mmol/L (ref 3.5–5.1)
Sodium: 138 mmol/L (ref 135–145)

## 2022-04-10 MED ORDER — CHLORHEXIDINE GLUCONATE CLOTH 2 % EX PADS
6.0000 | MEDICATED_PAD | Freq: Two times a day (BID) | CUTANEOUS | Status: DC
  Administered 2022-04-10 – 2022-04-14 (×7): 6 via TOPICAL

## 2022-04-10 MED ORDER — POTASSIUM CHLORIDE CRYS ER 10 MEQ PO TBCR: 10.0000 meq | EXTENDED_RELEASE_TABLET | Freq: Once | ORAL | Status: DC

## 2022-04-10 MED ORDER — POTASSIUM CHLORIDE CRYS ER 10 MEQ PO TBCR: 10.0000 meq | EXTENDED_RELEASE_TABLET | Freq: Two times a day (BID) | ORAL | Status: DC

## 2022-04-10 MED ORDER — ORAL CARE MOUTH RINSE: 15.0000 mL | OROMUCOSAL | Status: DC | PRN

## 2022-04-10 MED ORDER — POTASSIUM CHLORIDE CRYS ER 10 MEQ PO TBCR
10.0000 meq | EXTENDED_RELEASE_TABLET | Freq: Once | ORAL | Status: AC
  Administered 2022-04-10: 10 meq via ORAL
  Filled 2022-04-10: qty 1

## 2022-04-10 MED ORDER — LISINOPRIL 5 MG PO TABS
2.5000 mg | ORAL_TABLET | Freq: Every day | ORAL | Status: DC
  Administered 2022-04-10 – 2022-04-11 (×2): 2.5 mg via ORAL
  Filled 2022-04-10 (×2): qty 1

## 2022-04-10 NOTE — Plan of Care (Signed)
  Problem: Consults Goal: RH GENERAL PATIENT EDUCATION Description: See Patient Education module for education specifics. Outcome: Progressing Goal: Skin Care Protocol Initiated - if Braden Score 18 or less Description: If consults are not indicated, leave blank or document N/A Outcome: Progressing   Problem: RH SKIN INTEGRITY Goal: RH STG MAINTAIN SKIN INTEGRITY WITH ASSISTANCE Description: STG Maintain Skin Integrity With Mod I Assistance. Outcome: Progressing Goal: RH STG ABLE TO PERFORM INCISION/WOUND CARE W/ASSISTANCE Description: STG Able To Perform Incision/Wound Care With Mod I Assistance. Outcome: Progressing   Problem: RH SAFETY Goal: RH STG ADHERE TO SAFETY PRECAUTIONS W/ASSISTANCE/DEVICE Description: STG Adhere to Safety Precautions With Cues and Reminders. Outcome: Progressing Goal: RH STG DECREASED RISK OF FALL WITH ASSISTANCE Description: STG Decreased Risk of Fall With Mod I Assistance. Outcome: Progressing   Problem: RH KNOWLEDGE DEFICIT GENERAL Goal: RH STG INCREASE KNOWLEDGE OF SELF CARE AFTER HOSPITALIZATION Description: Patient will demonstrate knowledge of self-care management, skin/wound care, medication management with educational materials and handouts provided by staff independently at discharge. Outcome: Progressing

## 2022-04-10 NOTE — Progress Notes (Signed)
PROGRESS NOTE   Subjective/Complaints:  No new concerns this AM. She was seen by Dr. Sima Matas yesterday.    ROS:  Pt denies Fever, chills, cough, SOB, abd pain, HA, CP, vision changes  Objective:   No results found. No results for input(s): "WBC", "HGB", "HCT", "PLT" in the last 72 hours.  Recent Labs    04/09/22 0426 04/10/22 0210  NA 141 138  K 3.1* 3.5  CL 105 104  CO2 26 26  GLUCOSE 103* 101*  BUN 13 16  CREATININE 1.44* 1.52*  CALCIUM 8.7* 8.7*     Intake/Output Summary (Last 24 hours) at 04/10/2022 1053 Last data filed at 04/10/2022 0700 Gross per 24 hour  Intake 598.57 ml  Output 390 ml  Net 208.57 ml         Physical Exam: Vital Signs Blood pressure (!) 162/72, pulse 71, temperature 98.4 F (36.9 C), temperature source Oral, resp. rate 16, height 5' (1.524 m), weight 72 kg, SpO2 99 %.   General: awake, alert, appropriate; supine in bed; , NAD, sitting in bed HENT: conjugate gaze; oropharynx moist CV: RRR  Pulmonary: CTA B/L GI: soft, NT, ND, (+)BS Psychiatric: appropriate Neurological: Ox3 Neurologic: Cranial nerves II through XII intact, motor strength is 5/5 in bilateral deltoid, bicep, tricep, grip, 3- Bilateral hip flexor, knee extensors, 4/5 ankle dorsiflexor and plantar flexor Sensory exam normal sensation to light touch and proprioception in bilateral upper and lower extremities Cerebellar exam normal finger to nose to finger as well as heel to shin in bilateral upper and lower extremities Musculoskeletal: pain with B Hip ROM, no UE swelling or joint deformity, Neg knee or ankle effusion , Continues to have mild tenderness R thigh, improved when lying flat Feet , s/p Right 5th toe amp well healed.  No other deformity , no skin breakdown  Skin- VAC on R groin- no redness seen, small amount vac output  Assessment/Plan: 1. Functional deficits which require 3+ hours per day of  interdisciplinary therapy in a comprehensive inpatient rehab setting. Physiatrist is providing close team supervision and 24 hour management of active medical problems listed below. Physiatrist and rehab team continue to assess barriers to discharge/monitor patient progress toward functional and medical goals  Care Tool:  Bathing    Body parts bathed by patient: Right arm, Left arm, Chest, Abdomen, Front perineal area, Buttocks, Right upper leg, Left upper leg, Face   Body parts bathed by helper: Left lower leg, Right lower leg     Bathing assist Assist Level: Minimal Assistance - Patient > 75%     Upper Body Dressing/Undressing Upper body dressing   What is the patient wearing?: Hospital gown only    Upper body assist Assist Level: Supervision/Verbal cueing    Lower Body Dressing/Undressing Lower body dressing      What is the patient wearing?:  (mesh panties)     Lower body assist Assist for lower body dressing: Minimal Assistance - Patient > 75%     Toileting Toileting    Toileting assist Assist for toileting: Contact Guard/Touching assist     Transfers Chair/bed transfer  Transfers assist     Chair/bed transfer assist level: Contact Guard/Touching  assist     Locomotion Ambulation   Ambulation assist      Assist level: Contact Guard/Touching assist Assistive device: Walker-rolling Max distance: 178'   Walk 10 feet activity   Assist     Assist level: Contact Guard/Touching assist Assistive device: Walker-rolling   Walk 50 feet activity   Assist    Assist level: Contact Guard/Touching assist Assistive device: Walker-rolling    Walk 150 feet activity   Assist    Assist level: Contact Guard/Touching assist Assistive device: Walker-rolling    Walk 10 feet on uneven surface  activity   Assist Walk 10 feet on uneven surfaces activity did not occur: Safety/medical concerns         Wheelchair     Assist Is the patient  using a wheelchair?: No             Wheelchair 50 feet with 2 turns activity    Assist            Wheelchair 150 feet activity     Assist          Blood pressure (!) 162/72, pulse 71, temperature 98.4 F (36.9 C), temperature source Oral, resp. rate 16, height 5' (1.524 m), weight 72 kg, SpO2 99 %.  Medical Problem List and Plan: 1. Functional deficits secondary to debility secondary to peripheral vascular disease with debilitating claudication.  Status post bilateral endarterectomies with stent placement 03/17/2022 per Dr. Leotis Pain complicated by right groin wound infection status post right femoral exposure washout debridement redo patch plasty with ipsilateral GSV 03/30/2022.  Wound VAC is indicated x7 days through 04/06/2022             -patient may not shower due to [facility]             -ELOS/Goals: 7-10d  First day of evaluations- con't PT and OT  -Est discharge 6/23, mod I goals  -Ambulated 259 feet 2.  Antithrombotics: -DVT/anticoagulation:  Mechanical: Antiembolism stockings, thigh (TED hose) Bilateral lower extremities             -antiplatelet therapy: Aspirin 81 mg daily and Plavix 75 mg daily 3. Pain Management: Oxycodone as needed, pt hesistent to take meds for pain   6/13- add IV morphine 1 mg for VAC changes  6/15 pain controlled with current medications, continue to monitor 4. Mood: Pamelor 10 mg twice daily.  Provide emotional support             -antipsychotic agents: N/A  -6/17 She was seen by Dr. Sima Matas yesterday.  5. Neuropsych: This patient is capable of making decisions on her own behalf. 6. Skin/Wound Care: Routine skin checks 7. Fluids/Electrolytes/Nutrition: Routine in and outs with follow-up chemistries 8.  ID.  Currently maintained on Zosyn for right groin wound infection x6 weeks.  Right groin wound culture rare Pseudomonas aeruginosa.   6/13- will need PICC- order was placed- verify placed  6/14 PICC replaced  9.  Postoperative  cardiopulmonary arrest/non-STEMI with history of CAD/CABG.  Patient did receive CPR x2 minutes.  Follow-up cardiology service.  Continue Ranexa 500 mg twice daily.  Plan outpatient follow-up cardiology services  -6/17 denies chest pain 10.  Acute on chronic anemia.  Continue iron supplement.    -6/14 HGB stable at 8.9 11.  CKD stage III.  Baseline creatinine 1.42-1.56.  Latest creatinine 2.11 follow-up chemistries  6/13- Cr 1.32- doing well and BUN 10- con't to monitor  -6/17 Cr stable around baseline, monitor 12.  Diastolic congestive  heart failure.  Monitor for any signs of fluid overload 13.  Hypertension.  Normodyne 300 mg twice daily,Norvasc 10 mg daily just started 04/05/22, SBP worsened by pain.Enc pain med use.  Monitor with increased mobility  6/13- just added Norvasc back yesterday- 093 systolic this AM, but just got Am meds- monitor closely and titrate meds as required  6/17 Lisinopril 2.5 mg started 14.  Hyperlipidemia.  Zetia 10 mg daily. 16. Hypokalemia  -Kdur 69meq BID today, recheck tomorrow  6/17 K+  improved this AM, one more kdur 65meq  5 days A FACE TO FACE EVALUATION WAS PERFORMED  Jennye Boroughs 04/10/2022, 10:53 AM

## 2022-04-10 NOTE — Progress Notes (Signed)
Called for itching and burning under PICC dressing, upon assessment, pt. Denied any of  those itchiness and burning sensation under PICC dressing, dressing is dry,clean and intact. Advised to monitor what causes that itchiness and notify RN /Vast team  if it will reoccur. RN at bedside.

## 2022-04-10 NOTE — Progress Notes (Signed)
Physical Therapy Session Note  Patient Details  Name: Sabrina [name] MRN: [id number] Date of Birth: 05/11/1941  Today's Date: 04/10/2022 PT Individual Time: 0900-0958 PT Individual Time Calculation (min): 58 min   Short Term Goals: Week 1:  PT Short Term Goal 1 (Week 1): =LTG due to ELOS  Skilled Therapeutic Interventions/Progress Updates:  Pt seated in recliner with RN in room on arrival. Agreed to PT. Reports pain currently at 7/10 in right groin area.. RN to bring pain med's at patient request.   While seated assistance needed to don mesh underwear and pants over bil LE's up to knees. Supervision for pt to stand and complete donning of both in standing with increased time/effort needed. close supervision/min guard assist for pivot steps from recliner to wheelchair with supervision for sitting in wheelchair.  Pt transported from room to 33M gym for energy/time conservation.  Supervision for standing and pivot transfer to sit on NuStep with no device, increased time needed.   Use of NuStep for strengthening, ROM, reciprocal movements and intervals for activity tolerance. Level 3 x 5 minutes, 3 sets performed with 1-2 minute rest breaks between.   Supervision to stand NuStep>RW. Gait from 33M gym back to pt's room with min guard assist/close supervision. Pt with decreased gait speed and decreased stride length. No balance issues noted.  Once back in room at bedside pt able to pull pants down to knees with supervision for balance and to sit to EOB. Once seated needed assistance to fully doff pants and socks. Min assist to clear bed surface with LE's with lying down supine in bed. Cues to bend left knee and use single leg to bridge for improved pelvic positioning in bed.   Pt left with HOB 30 degrees and needs in reach. Pain after session 6-7/10 in right groin area.      Therapy Documentation Precautions:  Precautions Precautions: Fall, Other (comment) Precaution Comments: R groin  wound vac Restrictions Weight Bearing Restrictions: No General:   Vital Signs: Therapy Vitals Pulse Rate: 71 BP: (!) 162/72     Therapy/Group: Individual Therapy  Willow Ora, PTA, [place] 04/10/22, 12:58 PM

## 2022-04-11 LAB — BASIC METABOLIC PANEL
Anion gap: 8 (ref 5–15)
BUN: 11 mg/dL (ref 8–23)
CO2: 26 mmol/L (ref 22–32)
Calcium: 8.8 mg/dL — ABNORMAL LOW (ref 8.9–10.3)
Chloride: 103 mmol/L (ref 98–111)
Creatinine, Ser: 1.34 mg/dL — ABNORMAL HIGH (ref 0.44–1.00)
GFR, Estimated: 40 mL/min — ABNORMAL LOW (ref >60)
Glucose, Bld: 93 mg/dL (ref 70–99)
Potassium: 3.4 mmol/L — ABNORMAL LOW (ref 3.5–5.1)
Sodium: 137 mmol/L (ref 135–145)

## 2022-04-11 MED ORDER — LISINOPRIL 5 MG PO TABS
5.0000 mg | ORAL_TABLET | Freq: Every day | ORAL | Status: DC
Start: 2022-04-12 — End: 2022-04-16
  Administered 2022-04-12 – 2022-04-16 (×5): 5 mg via ORAL
  Filled 2022-04-11 (×6): qty 1

## 2022-04-11 MED ORDER — POTASSIUM CHLORIDE CRYS ER 20 MEQ PO TBCR
20.0000 meq | EXTENDED_RELEASE_TABLET | Freq: Two times a day (BID) | ORAL | Status: AC
Start: 2022-04-11 — End: 2022-04-11
  Administered 2022-04-11 (×2): 20 meq via ORAL
  Filled 2022-04-11 (×2): qty 1

## 2022-04-11 NOTE — Progress Notes (Signed)
PROGRESS NOTE   Subjective/Complaints:  Reports HA yesterday that improved with medications. No additional complaints.    ROS:  Pt denies Fever, chills, cough, SOB, abd pain, CP + HA-improved  Objective:   No results found. No results for input(s): "WBC", "HGB", "HCT", "PLT" in the last 72 hours.  Recent Labs    04/10/22 0210 04/11/22 0502  NA 138 137  K 3.5 3.4*  CL 104 103  CO2 26 26  GLUCOSE 101* 93  BUN 16 11  CREATININE 1.52* 1.34*  CALCIUM 8.7* 8.8*     Intake/Output Summary (Last 24 hours) at 04/11/2022 1223 Last data filed at 04/10/2022 1700 Gross per 24 hour  Intake 120 ml  Output --  Net 120 ml         Physical Exam: Vital Signs Blood pressure (!) 168/56, pulse 62, temperature 98.1 F (36.7 C), temperature source Oral, resp. rate 18, height 5' (1.524 m), weight 71.1 kg, SpO2 98 %.   General: awake, alert, appropriate; supine in bed; , NAD, sitting in bed HENT: conjugate gaze; oropharynx moist CV: RRR  Pulmonary: CTA B/L, nor rrw GI: soft, NT, ND, (+)BS Psychiatric: appropriate Neurological: Ox3 Neurologic: Cranial nerves II through XII intact, motor strength is 5/5 in bilateral deltoid, bicep, tricep, grip, 3- Bilateral hip flexor, knee extensors, 4/5 ankle dorsiflexor and plantar flexor Sensory exam normal sensation to light touch and proprioception in bilateral upper and lower extremities Cerebellar exam normal finger to nose to finger as well as heel to shin in bilateral upper and lower extremities Musculoskeletal: pain with B Hip ROM, no UE swelling or joint deformity, Neg knee or ankle effusion , Continues to have mild tenderness R thigh, improved when lying flat Feet , s/p Right 5th toe amp well healed.  No other deformity , no skin breakdown  Skin- VAC on R groin- no redness seen, small amount vac output PICC line RUE Assessment/Plan: 1. Functional deficits which require 3+ hours  per day of interdisciplinary therapy in a comprehensive inpatient rehab setting. Physiatrist is providing close team supervision and 24 hour management of active medical problems listed below. Physiatrist and rehab team continue to assess barriers to discharge/monitor patient progress toward functional and medical goals  Care Tool:  Bathing    Body parts bathed by patient: Right arm, Left arm, Chest, Abdomen, Front perineal area, Buttocks, Right upper leg, Left upper leg, Face   Body parts bathed by helper: Left lower leg, Right lower leg     Bathing assist Assist Level: Minimal Assistance - Patient > 75%     Upper Body Dressing/Undressing Upper body dressing   What is the patient wearing?: Hospital gown only    Upper body assist Assist Level: Supervision/Verbal cueing    Lower Body Dressing/Undressing Lower body dressing      What is the patient wearing?: Pants     Lower body assist Assist for lower body dressing: Contact Guard/Touching assist (with reacher)     Toileting Toileting    Toileting assist Assist for toileting: Contact Guard/Touching assist     Transfers Chair/bed transfer  Transfers assist     Chair/bed transfer assist level: Contact Guard/Touching assist  Locomotion Ambulation   Ambulation assist      Assist level: Contact Guard/Touching assist Assistive device: Walker-rolling Max distance: 178'   Walk 10 feet activity   Assist     Assist level: Contact Guard/Touching assist Assistive device: Walker-rolling   Walk 50 feet activity   Assist    Assist level: Contact Guard/Touching assist Assistive device: Walker-rolling    Walk 150 feet activity   Assist    Assist level: Contact Guard/Touching assist Assistive device: Walker-rolling    Walk 10 feet on uneven surface  activity   Assist Walk 10 feet on uneven surfaces activity did not occur: Safety/medical concerns         Wheelchair     Assist Is the  patient using a wheelchair?: No             Wheelchair 50 feet with 2 turns activity    Assist            Wheelchair 150 feet activity     Assist          Blood pressure (!) 168/56, pulse 62, temperature 98.1 F (36.7 C), temperature source Oral, resp. rate 18, height 5' (1.524 m), weight 71.1 kg, SpO2 98 %.  Medical Problem List and Plan: 1. Functional deficits secondary to debility secondary to peripheral vascular disease with debilitating claudication.  Status post bilateral endarterectomies with stent placement 03/17/2022 per Dr. Leotis Pain complicated by right groin wound infection status post right femoral exposure washout debridement redo patch plasty with ipsilateral GSV 03/30/2022.  Wound VAC is indicated x7 days through 04/06/2022             -patient may not shower due to [facility]             -ELOS/Goals: 7-10d  First day of evaluations- con't PT and OT  -Est discharge 6/23, mod I goals  -Ambulated 259 feet 2.  Antithrombotics: -DVT/anticoagulation:  Mechanical: Antiembolism stockings, thigh (TED hose) Bilateral lower extremities             -antiplatelet therapy: Aspirin 81 mg daily and Plavix 75 mg daily 3. Pain Management: Oxycodone as needed, pt hesistent to take meds for pain   6/13- add IV morphine 1 mg for VAC changes  6/15 pain controlled with current medications, continue to monitor 4. Mood: Pamelor 10 mg twice daily.  Provide emotional support             -antipsychotic agents: N/A  -6/17 She was seen by Dr. Sima Matas yesterday.  5. Neuropsych: This patient is capable of making decisions on her own behalf. 6. Skin/Wound Care: Routine skin checks 7. Fluids/Electrolytes/Nutrition: Routine in and outs with follow-up chemistries 8.  ID.  Currently maintained on Zosyn for right groin wound infection x6 weeks.  Right groin wound culture rare Pseudomonas aeruginosa.   6/13- will need PICC- order was placed- verify placed  6/14 PICC replaced  9.   Postoperative cardiopulmonary arrest/non-STEMI with history of CAD/CABG.  Patient did receive CPR x2 minutes.  Follow-up cardiology service.  Continue Ranexa 500 mg twice daily.  Plan outpatient follow-up cardiology services  -6/17 denies chest pain 10.  Acute on chronic anemia.  Continue iron supplement.    -6/14 HGB stable at 8.9 11.  CKD stage III.  Baseline creatinine 1.42-1.56.  Latest creatinine 2.11 follow-up chemistries  6/13- Cr 1.32- doing well and BUN 10- con't to monitor  -6/18 Cr improved to 1.34, follow 12.  Diastolic congestive heart failure.  Monitor for  any signs of fluid overload 13.  Hypertension.  Normodyne 300 mg twice daily,Norvasc 10 mg daily just started 04/05/22, SBP worsened by pain.Enc pain med use.  Monitor with increased mobility  6/13- just added Norvasc back yesterday- 812 systolic this AM, but just got Am meds- monitor closely and titrate meds as required  6/17 Lisinopril 2.5 mg started monitor for response  6/18 Lisinopril increased to 5mg , monitor 14.  Hyperlipidemia.  Zetia 10 mg daily. 16. Hypokalemia  -6/16Kdur 77meq BID today  6/18 K+ 3.4  Kdur 51meq BID, recheck labs tomorrow  6 days A FACE TO FACE EVALUATION WAS PERFORMED  Jennye Boroughs 04/11/2022, 12:23 PM

## 2022-04-11 NOTE — Progress Notes (Signed)
Physical Therapy Session Note  Patient Details  Name: Sabrina [name] MRN: [id number] Date of Birth: [birth year]  Today's Date: 04/11/2022 PT Individual Time: 3818-2993; 7169-6789 PT Individual Time Calculation (min): 60 min and 54 min  Short Term Goals: Week 1:  PT Short Term Goal 1 (Week 1): =LTG due to ELOS  Skilled Therapeutic Interventions/Progress Updates:    Session 1: Pt received seated in recliner in room, agreeable to PT session. Pt reports some pain in R groin region during session, premedicated for pain prior to start of therapy session. Sit to stand at Supervision level throughout session. Ambulation up to 150 ft with RW at Supervision level. Trial gait with no AD and CGA to min A for balance x 90 ft, pt reports onset of B hip pain during gait with no AD but exhibits good balance overall. Sit to/from supine on real bed in therapy apartment at Supervision level, increased time needed for BLE management and use of bedrail to perform supine to sit without assist from therapist. Recommending pt use a bedrail at home, will provide handout for where to purchase. Pt also reports she uses a stool to get into bed sometimes due to bed height at home. Standing alt L/R 3" step-taps 2 x 15 reps with 2 handrails and CGA for balance for hip strengthening. Progression to alt L/R 6" step-taps with 2 handrails and CGA, pt only able to perform x 5 reps with RLE before onset of fatigue/pain limiting ability to lift limb onto step without compensations. Pt requests to return to bed at end of session. Sit to supine Supervision with use of bedrail. Pt left seated in bed with needs in reach, bed alarm in place. Pt also reports onset of nausea at end of session, provided emesis bag and nursing notified.  Session 2: Pt received supine in bed, agreeable to PT session. Pt with some complaints of pain in R hip region during session, not rated and declines intervention. Bed mobility Supervision with increased time  and use of bedrail. Sit to stand and transfers with and without RW at Supervision level during session. Ambulation 2 x 150 ft, 1 x 200 ft with RW at Supervision level with seated rest breaks as needed due to fatigue. Nustep level 4 x 10 min with use of B UE/LE for global endurance training and working on increasing tolerance for R hip flexion. Pt returned to bed at end of session, Supervision. Pt left seated in bed with needs in reach in care of nursing for IV maintenance.  Therapy Documentation Precautions:  Precautions Precautions: Fall, Other (comment) Precaution Comments: R groin wound vac Restrictions Weight Bearing Restrictions: No       Therapy/Group: Individual Therapy   Excell Seltzer, PT, DPT, CSRS 04/11/2022, 12:42 PM

## 2022-04-11 NOTE — Progress Notes (Signed)
Occupational Therapy Session Note  Patient Details  Name: Sabrina [name] MRN: [id number] Date of Birth: February 03, 1941  Today's Date: 04/11/2022 OT Individual Time: 7290-2111 OT Individual Time Calculation (min): 70 min    Short Term Goals: Week 1:  OT Short Term Goal 1 (Week 1): STG=LTG (Due to ELOS)  Skilled Therapeutic Interventions/Progress Updates:  Pt greeted seated in recliner agreeable to OT intervention. Session focus LB AE for IADLs/ADLS, increasing overall activity tolerance, functional mobility, dynamic standing balance and decreasing overall caregiver burden.  Pt able to don pants with reacher and CGA + time. Pt reports having reacher at home  Pt completed stand pivot from recliner to w/c with RW and CGA. Total A transport to gym where pt completed various therapeutic activties as indicated below:  -Functional mobility in gym ~ 60 ft with Rw and CGA.  -Picking up items from floor level with reacher to simulate IADLs at home using reacher bag on RW for energy consevation with CGA - challenging dynamic gait with pt steppin gover cones and weaving in between cones with Rw and Cga, pt only able to step over with LLE d/t pain in RLE -challenging dynamic standing balance with pt instructed to sit<>stand and reach out of BOS to retrieve horseshoes with an emphasis on increasing LB strength/endurance via sit<>stands and reaching out of BOS, pt completed task with overall CGA   - 1 min of standing to toss horseshoes to target with CGA to challenge dynamic standing balance and increase standing tolerance for ADL participation. -  pt completed functional ambulation back to room with rw and CGA.  pt left seated in recliner with all needs within reach.                       Therapy Documentation Precautions:  Precautions Precautions: Fall, Other (comment) Precaution Comments: R groin wound vac Restrictions Weight Bearing Restrictions: No  Pain: Unrated pain reported in groin when  donning pants, readjusted waist band of pants for pain mgmt   Therapy/Group: Individual Therapy  Corinne Ports [facility] 04/11/2022, 9:17 AM

## 2022-04-11 NOTE — Plan of Care (Signed)
  Problem: Consults Goal: RH GENERAL PATIENT EDUCATION Description: See Patient Education module for education specifics. Outcome: Progressing Goal: Skin Care Protocol Initiated - if Braden Score 18 or less Description: If consults are not indicated, leave blank or document N/A Outcome: Progressing   Problem: RH SKIN INTEGRITY Goal: RH STG MAINTAIN SKIN INTEGRITY WITH ASSISTANCE Description: STG Maintain Skin Integrity With Mod I Assistance. Outcome: Progressing Goal: RH STG ABLE TO PERFORM INCISION/WOUND CARE W/ASSISTANCE Description: STG Able To Perform Incision/Wound Care With Mod I Assistance. Outcome: Progressing   Problem: RH SAFETY Goal: RH STG ADHERE TO SAFETY PRECAUTIONS W/ASSISTANCE/DEVICE Description: STG Adhere to Safety Precautions With Cues and Reminders. Outcome: Progressing Goal: RH STG DECREASED RISK OF FALL WITH ASSISTANCE Description: STG Decreased Risk of Fall With Mod I Assistance. Outcome: Progressing   Problem: RH KNOWLEDGE DEFICIT GENERAL Goal: RH STG INCREASE KNOWLEDGE OF SELF CARE AFTER HOSPITALIZATION Description: Patient will demonstrate knowledge of self-care management, skin/wound care, medication management with educational materials and handouts provided by staff independently at discharge. Outcome: Progressing

## 2022-04-12 LAB — CBC
HCT: 25.5 % — ABNORMAL LOW (ref 36.0–46.0)
Hemoglobin: 8.4 g/dL — ABNORMAL LOW (ref 12.0–15.0)
MCH: 29.6 pg (ref 26.0–34.0)
MCHC: 32.9 g/dL (ref 30.0–36.0)
MCV: 89.8 fL (ref 80.0–100.0)
Platelets: 325 10*3/uL (ref 150–400)
RBC: 2.84 MIL/uL — ABNORMAL LOW (ref 3.87–5.11)
RDW: 15.5 % (ref 11.5–15.5)
WBC: 6.8 10*3/uL (ref 4.0–10.5)
nRBC: 0 % (ref 0.0–0.2)

## 2022-04-12 LAB — BASIC METABOLIC PANEL
Anion gap: 6 (ref 5–15)
BUN: 13 mg/dL (ref 8–23)
CO2: 27 mmol/L (ref 22–32)
Calcium: 8.9 mg/dL (ref 8.9–10.3)
Chloride: 105 mmol/L (ref 98–111)
Creatinine, Ser: 1.49 mg/dL — ABNORMAL HIGH (ref 0.44–1.00)
GFR, Estimated: 35 mL/min — ABNORMAL LOW (ref >60)
Glucose, Bld: 90 mg/dL (ref 70–99)
Potassium: 3.8 mmol/L (ref 3.5–5.1)
Sodium: 138 mmol/L (ref 135–145)

## 2022-04-12 MED ORDER — CIPROFLOXACIN HCL 500 MG PO TABS
500.0000 mg | ORAL_TABLET | Freq: Every day | ORAL | Status: DC
Start: 2022-04-12 — End: 2022-04-16
  Administered 2022-04-12 – 2022-04-15 (×4): 500 mg via ORAL
  Filled 2022-04-12 (×4): qty 1

## 2022-04-12 NOTE — Progress Notes (Signed)
Occupational Therapy Session Note  Patient Details  Name: Sabrina [name] MRN: [id number] Date of Birth: July 19, 1941  Today's Date: 04/12/2022 OT Individual Time: 2694-8546 OT Individual Time Calculation (min): 70 min    Short Term Goals: Week 1:  OT Short Term Goal 1 (Week 1): STG=LTG (Due to ELOS)  Skilled Therapeutic Interventions/Progress Updates:    Pt resting in bed upon arrival with family present. Discussed bed for use at home and equipment recommendations. Pt required assistance this morning donning pants without AE. Pt requested to use toilet and amb with RW to bathroom with supervision; assistance for mgmt of IV pole with wound vac. Toileting with supervision. Pt amb with RW to ortho gym and completed standing tasks on [employer]-attaching clothes pins to dowel and folding towels. Pt amb with RW back to room and transferred to bed to await wound care nurse. Sit>supine with supervision. Pt remained in bed with RN and family present.   Therapy Documentation Precautions:  Precautions Precautions: Fall, Other (comment) Precaution Comments: R groin wound vac Restrictions Weight Bearing Restrictions: No Pain:  Pt reports increased discomfort around wound vac site with transitional movements; rest   Therapy/Group: Individual Therapy  Leroy Libman 04/12/2022, 9:47 AM

## 2022-04-12 NOTE — Progress Notes (Signed)
PROGRESS NOTE   Subjective/Complaints:  Pt reports LBM yesterday.  Peeing OK.  Pain whenever she bends over or during PT.  Doesn't want to go to SNF< but doesn't want to go home with IV ABX or VAC- explained we have to do one or the other- will have SW speak with her and family again on plans.  Also d/w PA to do so as well.    ROS:  Pt denies SOB, abd pain, CP, N/V/C/D, and vision changes  Objective:   No results found.  Recent Labs    04/12/22 0407  WBC 6.8  HGB 8.4*  HCT 25.5*  PLT 325   Recent Labs    04/11/22 0502 04/12/22 0407  NA 137 138  K 3.4* 3.8  CL 103 105  CO2 26 27  GLUCOSE 93 90  BUN 11 13  CREATININE 1.34* 1.49*  CALCIUM 8.8* 8.9    Intake/Output Summary (Last 24 hours) at 04/12/2022 1251 Last data filed at 04/12/2022 0740 Gross per 24 hour  Intake 320 ml  Output --  Net 320 ml        Physical Exam: Vital Signs Blood pressure (!) 174/68, pulse 70, temperature 98.2 F (36.8 C), temperature source Oral, resp. rate 18, height 5' (1.524 m), weight 71.5 kg, SpO2 98 %.    General: awake, alert, appropriate, sitting up in bed; getting IV ABX; NAD HENT: conjugate gaze; oropharynx moist CV: regular rate; no JVD Pulmonary: CTA B/L; no W/R/R- good air movement GI: soft, NT, ND, (+)BS Psychiatric: appropriate but depressed affect Neurological: Ox3 Skin: VAC on R groin- <1/4 of container filled with sanguinous drainage.  Neurologic: Cranial nerves II through XII intact, motor strength is 5/5 in bilateral deltoid, bicep, tricep, grip, 3- Bilateral hip flexor, knee extensors, 4/5 ankle dorsiflexor and plantar flexor Sensory exam normal sensation to light touch and proprioception in bilateral upper and lower extremities Cerebellar exam normal finger to nose to finger as well as heel to shin in bilateral upper and lower extremities Musculoskeletal: pain with B Hip ROM, no UE swelling or joint  deformity, Neg knee or ankle effusion , Continues to have mild tenderness R thigh, improved when lying flat Feet , s/p Right 5th toe amp well healed.  No other deformity , no skin breakdown  Skin- VAC on R groin- no redness seen, small amount vac output PICC line RUE    Assessment/Plan: 1. Functional deficits which require 3+ hours per day of interdisciplinary therapy in a comprehensive inpatient rehab setting. Physiatrist is providing close team supervision and 24 hour management of active medical problems listed below. Physiatrist and rehab team continue to assess barriers to discharge/monitor patient progress toward functional and medical goals  Care Tool:  Bathing    Body parts bathed by patient: Right arm, Left arm, Chest, Abdomen, Front perineal area, Buttocks, Right upper leg, Left upper leg, Face   Body parts bathed by helper: Left lower leg, Right lower leg     Bathing assist Assist Level: Minimal Assistance - Patient > 75%     Upper Body Dressing/Undressing Upper body dressing   What is the patient wearing?: Hospital gown only    Upper  body assist Assist Level: Supervision/Verbal cueing    Lower Body Dressing/Undressing Lower body dressing      What is the patient wearing?: Pants     Lower body assist Assist for lower body dressing: Contact Guard/Touching assist (with reacher)     Toileting Toileting    Toileting assist Assist for toileting: Supervision/Verbal cueing     Transfers Chair/bed transfer  Transfers assist     Chair/bed transfer assist level: Supervision/Verbal cueing     Locomotion Ambulation   Ambulation assist      Assist level: Supervision/Verbal cueing Assistive device: Walker-rolling Max distance: 150'   Walk 10 feet activity   Assist     Assist level: Supervision/Verbal cueing Assistive device: Walker-rolling   Walk 50 feet activity   Assist    Assist level: Supervision/Verbal cueing Assistive device:  Walker-rolling    Walk 150 feet activity   Assist    Assist level: Supervision/Verbal cueing Assistive device: Walker-rolling    Walk 10 feet on uneven surface  activity   Assist Walk 10 feet on uneven surfaces activity did not occur: Safety/medical concerns         Wheelchair     Assist Is the patient using a wheelchair?: No             Wheelchair 50 feet with 2 turns activity    Assist            Wheelchair 150 feet activity     Assist          Blood pressure (!) 174/68, pulse 70, temperature 98.2 F (36.8 C), temperature source Oral, resp. rate 18, height 5' (1.524 m), weight 71.5 kg, SpO2 98 %.  Medical Problem List and Plan: 1. Functional deficits secondary to debility secondary to peripheral vascular disease with debilitating claudication.  Status post bilateral endarterectomies with stent placement 03/17/2022 per Dr. Leotis Pain complicated by right groin wound infection status post right femoral exposure washout debridement redo patch plasty with ipsilateral GSV 03/30/2022.              -patient may not shower due to [facility]             -ELOS/Goals: 7-10d  First day of evaluations- con't PT and OT  -Est discharge 6/23, mod I goals  -Con't CIR- PT and OT-  2.  Antithrombotics: -DVT/anticoagulation:  Mechanical: Antiembolism stockings, thigh (TED hose) Bilateral lower extremities             -antiplatelet therapy: Aspirin 81 mg daily and Plavix 75 mg daily 3. Pain Management: Oxycodone as needed, pt hesistent to take meds for pain   6/13- add IV morphine 1 mg for VAC changes  6/19- con't regimen- pain usually controlled when she takes meds 4. Mood: Pamelor 10 mg twice daily.  Provide emotional support             -antipsychotic agents: N/A  -6/17 She was seen by Dr. Sima Matas yesterday.  5. Neuropsych: This patient is capable of making decisions on her own behalf. 6. Skin/Wound Care: Routine skin checks 7. Fluids/Electrolytes/Nutrition:  Routine in and outs with follow-up chemistries 8.  ID.  Currently maintained on Zosyn for right groin wound infection x6 weeks.  Right groin wound culture rare Pseudomonas aeruginosa.   6/13- will need PICC- order was placed- verify placed  6/14 PICC replaced  9.  Postoperative cardiopulmonary arrest/non-STEMI with history of CAD/CABG.  Patient did receive CPR x2 minutes.  Follow-up cardiology service.  Continue Ranexa 500  mg twice daily.  Plan outpatient follow-up cardiology services  -6/17 denies chest pain 10.  Acute on chronic anemia.  Continue iron supplement.    -6/14 HGB stable at 8.9  6/19- Hb 8.4- overall stable 11.  CKD stage III.  Baseline creatinine 1.42-1.56.  Latest creatinine 2.11 follow-up chemistries  6/13- Cr 1.32- doing well and BUN 10- con't to monitor  -6/18 Cr improved to 1.34, follow  6/19- Cr 1.49- bumps up and down slightly- normal levels for her.  12.  Diastolic congestive heart failure.  Monitor for any signs of fluid overload 13.  Hypertension.  Normodyne 300 mg twice daily,Norvasc 10 mg daily just started 04/05/22, SBP worsened by pain.Enc pain med use.  Monitor with increased mobility  6/13- just added Norvasc back yesterday- 062 systolic this AM, but just got Am meds- monitor closely and titrate meds as required  6/17 Lisinopril 2.5 mg started monitor for response  6/18 Lisinopril increased to 5mg , monitor  6/94- BP 854O systolic- will wait a couple of days before increasing.  14.  Hyperlipidemia.  Zetia 10 mg daily. 16. Hypokalemia  -6/16Kdur 5meq BID today  6/18 K+ 3.4  Kdur 75meq BID, recheck labs tomorrow  6/19- K+ 3.8- doing better- will recheck Thursday  I spent a total of  38  minutes on total care today- >50% coordination of care- due to discussing with PA and SW about d/c plans- VAC and IV ABX.   7 days A FACE TO FACE EVALUATION WAS PERFORMED  Nicko Daher 04/12/2022, 12:51 PM

## 2022-04-12 NOTE — Progress Notes (Signed)
Physical Therapy Session Note  Patient Details  Name: Sabrina [name] MRN: [id number] Date of Birth: [birth year]  Today's Date: 04/12/2022 PT Individual Time: 1100-1145 PT Individual Time Calculation (min): 45 min   Short Term Goals: Week 1:  PT Short Term Goal 1 (Week 1): =LTG due to ELOS  Skilled Therapeutic Interventions/Progress Updates:    Pt received supine in bed, agreeable to PT session. Pt reports ongoing pain in R hip/groin region due to just having wound vac changed, reports being premedicated for pain. Pt's family [name] and Nicole Kindred (daughter and son-in-law) present for family education session. Pt performs bed mobility with Supervision with use of bedrail, pt's daughter reports they already purchased bedrail for use at home. Sit to stand and transfers at Supervision level with use of RW throughout session. Ambulation 2 x 150 ft with RW at Supervision level, antalgic gait pattern at times. Car transfer at simulation height of sedan pt will d/c home in, min A needed for RLE management in/out of car. Pt and family with questions regarding showering, pt currently unable to shower 2/2 PICC line and wound vac but OT to update family on recommended shower equipment based on her home setup once she is able to safely shower. Pt requesting to return to room due to fatigue. Pt left seated in recliner in room with needs in reach, family present. Pt missed 15 min of scheduled therapy session due to fatigue.  Therapy Documentation Precautions:  Precautions Precautions: Fall, Other (comment) Precaution Comments: R groin wound vac Restrictions Weight Bearing Restrictions: No General: PT Amount of Missed Time (min): 15 Minutes PT Missed Treatment Reason: Patient fatigue      Therapy/Group: Individual Therapy   Excell Seltzer, PT, DPT, CSRS 04/12/2022, 12:01 PM

## 2022-04-12 NOTE — Progress Notes (Signed)
  VAC change today 6/19

## 2022-04-12 NOTE — Progress Notes (Signed)
Pharmacy Antibiotic Note- follow-up  Chaya A Takacs is a 81 y.o. female on Zosyn for R groin infection. S/p I&D 6/6. Operative culture grew Pseudomonas. Noted hx itching from PCN, but no report of intolerance to Zosyn. Planning 6 weeks antibiotics.  Plan: Continue Zosyn 3.375 gm IV q8h (each over 4 hours) Intermittent bmet.   Height: 5' (152.4 cm) Weight: 71.5 kg (157 lb 10.1 oz) IBW/kg (Calculated) : 45.5  Temp (24hrs), Avg:98.1 F (36.7 C), Min:98 F (36.7 C), Max:98.2 F (36.8 C)  Recent Labs  Lab 04/06/22 0508 04/09/22 0426 04/10/22 0210 04/11/22 0502 04/12/22 0407  WBC 8.7  --   --   --  6.8  CREATININE 1.32* 1.44* 1.52* 1.34* 1.49*     Estimated Creatinine Clearance: 26.6 mL/min (A) (by C-G formula based on SCr of 1.49 mg/dL (H)).    Allergies  Allergen Reactions   Citalopram Anaphylaxis    Throat closing    Dilaudid [Hydromorphone Hcl] Nausea And Vomiting   Hydrochlorothiazide Other (See Comments)    Decreased GFR (Nov 2015)   Liothyronine     Hair fell out, caused headaches    Nsaids     CKD stage III - avoid nephrotoxic drugs   Nubain [Nalbuphine Hcl]     Burning sensation in back   Penicillins Itching   Prasugrel Itching   Statins Itching    Antimicrobials this admission: Ceftriaxone 6/5>>6/6 Metronidazole 6/5>>6/6 Zosyn 6/6>> 7/17 [6 weeks]  Dose adjustments this admission: 6/9: Zosyn to 2.25 gm IV q8h for worsening renal function 6/11: Zosyn back to 3.375 gm IV q8h - renal function back to baseline  Microbiology results: 5/30 Wound culture: moderate Proteus - pansensitive  - and rare Klebsiella - S to all except R Ampicillin 6/5 blood: negative 6/6 R groin tissue: rare Pseudomonas- pan-sensitive 6/6 MSSA PCR: positive   Thank you for allowing pharmacy to be a part of this patient's care.  Vaughan Basta BS, PharmD, BCPS Clinical Pharmacist 04/12/2022 8:22 AM  Contact: [phone] after 3 PM  "Be curious, not judgmental..." -Jamal Maes

## 2022-04-12 NOTE — Progress Notes (Signed)
Occupational Therapy Session Note  Patient Details  Name: Sabrina [name] MRN: [id number] Date of Birth: [birth year]  Today's Date: 04/12/2022 OT Individual Time: 1300-1355 OT Individual Time Calculation (min): 55 min    Short Term Goals: Week 1:  OT Short Term Goal 1 (Week 1): STG=LTG (Due to ELOS)  Skilled Therapeutic Interventions/Progress Updates:    Pt resting in recliner upon arrival. OT intervention with focus on functional amb with RW and standing balance on compliant surface (Airex). Pt completed several standing balance activities at BITS while standing on Airex with CGA; no LOB noted while pt reaching across midline and stretching outside BOS. Activities included: visual scanning, rotator X 2, trail making, maze, and bell cancellation. Pt returned to room and returned to bed. Sit>supine with min A. Pt remained in bed with all needs within reach and bed alarm activated.  Therapy Documentation Precautions:  Precautions Precautions: Fall, Other (comment) Precaution Comments: R groin wound vac Restrictions Weight Bearing Restrictions: No Pain: Pain Assessment Faces Pain Scale: Hurts a little bit with transitional movements; rest and return to bed at end of session   Therapy/Group: Individual Therapy  Leroy Libman 04/12/2022, 2:04 PM

## 2022-04-12 NOTE — Consult Note (Signed)
[place] Nurse wound follow up Wound type: surgical  Measurement: 4cm x 2cm x 0.5cm  Wound WTK:TCCEQ, pink, moist, minimal fibrinous material at two sites in the wound bed  Drainage (amount, consistency, odor) serosanguinous in VAC canister only 40cc documented in the last 24 hours, canister drainage appears old Periwound:slightly macerated  Dressing procedure/placement/frequency: Removed old NPWT dressing Skin protected to the right thigh with VAC drape for foam bridge  Filled wound with  _2__ piece of black foam,  Sealed NPWT dressing at 168mm HG/ Patient received IV/ pain medication per bedside nurse prior to dressing change Patient tolerated procedure well Due to shallow wound and location (difficult to maintain) especially with pending DC to home, suggesting DC of NPWT and to begin hydrogel dressings daily, using ostomy barrier in the skin folds under the pannus and in the thigh to maintain seal.  Replaced today; plans for tentative DC of NPWT Wednesday. Notified Dr. Virl Cagey and Dr. Dagoberto Ligas. Discussed in detail with patient and her family at the bedside and rationale for same.    [place] nurse will continue to provide NPWT dressing changed due to the complexity of the dressing change.     [place], [place], [place]

## 2022-04-13 NOTE — Progress Notes (Signed)
Patient ID: Sabrina [name], female   DOB: 09-Jul-1941, 81 y.o.   MRN: [id number]  SW met with pt in room to provide updates from team conference, d/c date remains 6/23, and preparing for wound vac to be removed and pending results for IV abx. SW shared will provide updates. SW shared [facility] was willing to accept pt for [facility]. She would like HHA list to review to be sure this is the agency she would like.   SW provided pt HHA list ([email or web address]).   1520- SW spoke with [name] to provide updates from team conference, and discuss above. She will review HHA list with her mother.    Loralee Pacas, MSW, [place] Office: [phone] Cell: [phone] Fax: [phone]

## 2022-04-13 NOTE — Discharge Summary (Incomplete)
Physician Discharge Summary  Patient ID: Sabrina [name] MRN: [id number] DOB/AGE: 05/09/1941 81 y.o.  Admit date: 04/05/2022 Discharge date: 04/16/2022  Discharge Diagnoses:  Principal Problem:   Debility Peripheral vascular disease with debilitating claudication Pain management ID/wound infection Postoperative cardiopulmonary arrest/non-STEMI with history of CAD/CABG Mood stabilization Acute blood loss anemia Diastolic congestive heart failure Hypertension Hyperlipidemia CKD stage III  Discharged Condition: Stable  Significant Diagnostic Studies: [place] EKG SITE RITE  Result Date: 04/05/2022 If Site Rite image not attached, placement could not be confirmed due to current cardiac rhythm.  DG Abd Portable 1V  Result Date: 04/04/2022 CLINICAL DATA:  Abdominal distension. EXAM: PORTABLE ABDOMEN - 1 VIEW COMPARISON:  07/04/2017.  CT, 08/04/2017. FINDINGS: Normal bowel gas pattern. Bilateral iliac vascular stents, stable. Previous cholecystectomy. No evidence of renal or ureteral stones. Bilateral total hip arthroplasties, incompletely imaged, grossly unchanged. No acute skeletal abnormality. IMPRESSION: 1. No acute findings. No bowel dilation to account for abdominal distension. Electronically Signed   By: Lajean Manes M.D.   On: 04/04/2022 13:20   US RENAL  Result Date: 04/02/2022 CLINICAL DATA:  Acute kidney injury EXAM: RENAL / URINARY TRACT ULTRASOUND COMPLETE COMPARISON:  None Available. FINDINGS: Right Kidney: Renal measurements: 10.3 x 3.6 x 3.1 cm = volume: 59 mL. Echogenicity within normal limits. No mass or hydronephrosis visualized. Left Kidney: Renal measurements: 9.7 x 4.0 x 4.3 cm = volume: 86 mL. Echogenicity within normal limits. No mass or hydronephrosis visualized. Bladder: Decompressed urinary bladder. Other: None. IMPRESSION: 1. No hydronephrosis. 2. Decompressed urinary bladder. Electronically Signed   By: Delanna Ahmadi M.D.   On: 04/02/2022 15:59   DG ABD ACUTE 2+V  W 1V CHEST  Result Date: 03/29/2022 CLINICAL DATA:  Postoperative wound infection.  Abdominal pain. EXAM: DG ABDOMEN ACUTE WITH 1 VIEW CHEST COMPARISON:  Chest radiograph 09/22/2021. FINDINGS: Median sternotomy stable cardiomegaly with unchanged mediastinal contours. Aortic atherosclerosis. There is mild peribronchial thickening. No focal airspace disease or pleural effusion. No free intra-abdominal air. Scattered air throughout small and large bowel in a nonobstructive pattern. Cholecystectomy clips in the right upper quadrant. Iliac stents. Surgical clips project over both inguinal regions. Bilateral hip arthroplasties. IMPRESSION: 1. Nonobstructive bowel gas pattern. No free air. 2. Cardiomegaly with mild peribronchial thickening. Electronically Signed   By: Keith Rake M.D.   On: 03/29/2022 23:01   ECHOCARDIOGRAM COMPLETE  Result Date: 03/18/2022    ECHOCARDIOGRAM REPORT   Patient Name:   Sabrina [name] Date of Exam: 03/18/2022 Medical Rec #:  [id number]          Height:       61.0 in Accession #:    [id number]         Weight:       161.6 lb Date of Birth:  01/25/1941          BSA:          1.725 m Patient Age:    81 years           BP:           133/64 mmHg Patient Gender: F                  HR:           73 bpm. Exam Location:  [facility] Procedure: 2D Echo, Cardiac Doppler and Color Doppler Indications:     Cardiac arrest I46.9  History:         Patient has no prior history of Echocardiogram examinations.  CHF, CAD, Prior CABG; Risk Factors:Hypertension.  Sonographer:     Sherrie Sport Referring Phys:  [id number] Flora Lipps Diagnosing Phys: Donnelly Angelica  Sonographer Comments: Technically challenging study due to limited acoustic windows, no apical window and no subcostal window. IMPRESSIONS  1. Left ventricular ejection fraction, by estimation, is 60 to 65%. The left ventricle has normal function. The left ventricle has no regional wall motion abnormalities. There is moderate left ventricular  hypertrophy. Left ventricular diastolic function  could not be evaluated.  2. The mitral valve is abnormal. Trivial mitral valve regurgitation. Moderate mitral annular calcification.  3. The aortic valve is calcified. Aortic valve regurgitation is not visualized. Conclusion(s)/Recommendation(s): Very limited study. Normal LV function. Unable to assess for mitral stensos which previously was moderate to severe. Unable to assess for AS, and no TR velocity obtained to assess RVSP. FINDINGS  Left Ventricle: Left ventricular ejection fraction, by estimation, is 60 to 65%. The left ventricle has normal function. The left ventricle has no regional wall motion abnormalities. The left ventricular internal cavity size was normal in size. There is  moderate left ventricular hypertrophy. Left ventricular diastolic function could not be evaluated. Pericardium: There is no evidence of pericardial effusion. Mitral Valve: The mitral valve is abnormal. Moderate mitral annular calcification. Trivial mitral valve regurgitation. Tricuspid Valve: The tricuspid valve is normal in structure. Tricuspid valve regurgitation is mild. Aortic Valve: The aortic valve is calcified. Aortic valve regurgitation is not visualized. Pulmonic Valve: The pulmonic valve was not well visualized. Pulmonic valve regurgitation is not visualized. No evidence of pulmonic stenosis. Aorta: The aortic root is normal in size and structure.  LEFT VENTRICLE PLAX 2D LVIDd:         3.80 cm LVIDs:         2.10 cm LV PW:         1.30 cm LV IVS:        1.30 cm LVOT diam:     2.00 cm LVOT Area:     3.14 cm  LEFT ATRIUM         Index LA diam:    3.90 cm 2.26 cm/m                        PULMONIC VALVE AORTA                 PV Vmax:        0.85 m/s Ao Root diam: 2.90 cm PV Vmean:       48.900 cm/s                       PV VTI:         0.124 m                       PV Peak grad:   2.9 mmHg                       PV Mean grad:   1.0 mmHg                       RVOT Peak grad: 6  mmHg   SHUNTS Systemic Diam: 2.00 cm Pulmonic VTI:  0.241 m Donnelly Angelica Electronically signed by Donnelly Angelica Signature Date/Time: 03/18/2022/10:44:10 AM    Final    DG C-Arm 1-60 Min-No Report  Result Date: 03/17/2022 Fluoroscopy was utilized by the requesting  physician.  No radiographic interpretation.    Labs:  Basic Metabolic Panel: Recent Labs  Lab 04/09/22 0426 04/10/22 0210 04/11/22 0502 04/12/22 0407 04/14/22 0252  NA 141 138 137 138 137  K 3.1* 3.5 3.4* 3.8 3.6  CL 105 104 103 105 103  CO2 26 26 26 27 26   GLUCOSE 103* 101* 93 90 102*  BUN 13 16 11 13 15   CREATININE 1.44* 1.52* 1.34* 1.49* 1.48*  CALCIUM 8.7* 8.7* 8.8* 8.9 9.0    CBC: Recent Labs  Lab 04/12/22 0407 04/14/22 0252  WBC 6.8 6.7  NEUTROABS  --  3.9  HGB 8.4* 8.5*  HCT 25.5* 26.7*  MCV 89.8 90.8  PLT 325 308    CBG: No results for input(s): "GLUCAP" in the last 168 hours.  Family history.  Mother with CVA hypertension diabetes.  Father with hypertension.  Daughter with multiple sclerosis.  Son with cerebral aneurysm.  Denies any colon cancer esophageal cancer or rectal cancer  Brief HPI:   Malene A Pha is a 81 y.o. 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  Per chart review lives alone.  Family with good support.  Presented initially to [facility]17[facility]20[facility] for elective bilateral superior femoral artery and left iliac stenting with vascular surgery due to peripheral vascular disease and debilitating claudication with some mild rest pain.  Underwent left common femoral  profunda femoris and superficial femoral artery endarterectomies as well as right common femoral profunda femoris and superficial femoral artery enterectomys stent placement to left external iliac artery with 8 mm diameter by 4 cm length of life star stent 03/17/2022 per Dr. Leotis Pain.  Postoperative acute respiratory failure cardiac arrest requiring CPR x2 minutes.  Found to have markedly elevated troponin of 15,000.  She did require intubation for airway protection and extubated same day.  Echocardiogram with ejection fraction of 60 to 65% no wall motion abnormality.  Maintained on aspirin and Plavix for at least 12 months then plan to discuss outpatient cardiac catheterization.  Ranexa ongoing twice daily per cardiology services Dr. Clayborn Bigness.  Ongoing wound care bilateral groin dressings with noted drainage wound VAC had been removed 03/22/2022 wound cultures are pending.  Tolerating a regular diet.  Acute on chronic anemia 8.3 monitor transfused 5/27 1 unit packed red blood cells.  Therapy evaluations completed and patient was admitted to inpatient rehab services 03/24/2022.  Patient with slow but progressive gains.  Persistent drainage from right groin wound she was covered with Keflex for recent wound cultures.  Follow-up chemistries noted leukocytosis 15,600 blood cultures no growth to date surgical PCR screening positive.  Vascular surgery consulted 03/29/2022 and felt debridement was needed.  She was discharged to acute care services 03/29/2022 and underwent incision and debridement of right groin wound reexposure of the common femoral common femoral artery patch excision common femoral patch plasty using ipsilateral greater saphenous vein and wound VAC placement 03/30/2022 per Dr. Gwenlyn Saran.  Patient's aspirin and Plavix ongoing as prior to admission.  Placed on Zosyn for wound coverage planning 6 weeks.  Therapy evaluations completed and patient was readmitted for a comprehensive rehab program.   Hospital  Course: [facility]be[facility]ail A  [place] was admitted to rehab 04/05/2022 for inpatient therapies to consist of PT, ST and OT at least three hours five days a week. Past admission physiatrist, therapy team and rehab RN have worked together to provide customized collaborative inpatient rehab.  Pertaining to patient's debility related to peripheral vascular disease debilitating claudication initial status post bilateral endarterectomies with stent placement 03/17/2022 per Dr. Leotis Pain complicated by right groin wound infection status post right femoral exposure washout debridement redo patch plasty with ipsilateral GSV 03/30/2022 per Dr. Gwenlyn Saran.  Wound VAC remained in place wound was healing nicely it was at the discretion of vascular surgery on if to continue wound VAC and ultimately removed 04/14/2022 with orders for hydrogel daily dressing changes.  She remained on aspirin and Plavix as prior to admission.  Initially on Zosyn for right groin wound infection transitioned to p.o. Cipro 500 mg daily she would remain on through 05/10/2022 and was afebrile.  Her hospital course initially complicated by postoperative cardiopulmonary arrest/non-STEMI with history of CAD/CABG she did receive CPR x2 minutes after her initial surgery Ranexa ongoing twice daily she would follow-up outpatient cardiology services.  Acute on chronic anemia stable latest hemoglobin 8.4 no bleeding episodes.  CKD stage III creatinine 1.49 monitoring for any signs of fluid overload.  Blood pressure controlled on present regimen she would need outpatient monitoring.  Zetia for hyperlipidemia.   Blood pressures were monitored on TID basis and soft and monitored     Rehab course: During patient's stay in rehab weekly team conferences were held to monitor patient's progress, set goals and discuss barriers to discharge. At admission, patient required minimal assist 40 feet rolling walker minimal assist step pivot transfers  Physical exam.  Blood pressure  183/76 pulse 76 temperature 98 respirations 18 oxygen saturations 100% room air Constitutional.  No acute distress HEENT Head.  Normocephalic and atraumatic Eyes.  Pupils round and reactive to light no discharge without nystagmus Neck.  Supple nontender no JVD without thyromegaly Cardiac regular rate and rhythm without any extra sounds or murmur heard Abdomen.  Soft nontender positive bowel sounds without rebound Respiratory effort normal no respiratory distress without wheeze Skin.  Groin incision dressed with wound VAC in place Neurologic.  Cranial nerves II through XII intact motor strength 5/5 in bilateral deltoid bicep tricep grip, 3 - bilateral hip flexors, knee extensors, 4/5 ankle dorsi plantarflexion.  He/She  has had improvement in activity tolerance, balance, postural control as well as ability to compensate for deficits. He/She has had improvement in functional use RUE/LUE  and RLE/LLE as well as improvement in awareness.  Working with energy conservation techniques.  Ambulates 150 feet x 2 rolling walker supervision.  Car transfers of simulation height of sedan minimal assist.  Patient completed several standing balance activities at BITS while standing on Airex with contact-guard.  She did require some assistance for donning pants.  Patient could use the toilet ambulate rolling walker to the bathroom supervision.  Full family teaching completed plan discharge to home       Disposition: Discharge to home    Diet: Regular  Special Instructions: No driving smoking or alcohol   Hydrogel daily dressing changes to groin site  Medications at discharge. 1.  Tylenol as needed 2.  Norvasc 10 mg p.o. daily 3.  Aspirin 81 mg p.o. daily 4.  Cipro 500 mg p.o. daily through 05/10/2022 and stop 5.  Plavix 75 mg p.o. daily 6.  Zetia 10 mg p.o. daily 7.  Ferrous sulfate  325 mg p.o. daily 8.  Labetalol 300 mg p.o. twice daily 9.  Lisinopril 5 mg p.o. daily 10.  Pamelor 10 mg p.o.  twice daily 11.  Oxycodone 1 to 2 tablets every 4 hours as needed pain 12.  Ranexa 500 mg p.o. twice daily  30-35 minutes were spent completing discharge summary and discharge planning  Discharge Instructions     Ambulatory referral to Physical Medicine Rehab   Complete by: As directed    Moderate complexity follow up 1-2 weeks debility/PAD        Follow-up Information     Lovorn, Jinny Blossom, MD Follow up.   Specialty: Physical Medicine and Rehabilitation Why: office to call for appointment Contact information: [address] [place] [address] [phone]         Broadus John, MD Follow up.   Specialty: Vascular Surgery Why: Call for appointment Contact information: [place] [address] [phone]         Yolonda Kida, MD Follow up.   Specialties: Cardiology, Internal Medicine Why: Call for appointment Contact information: [place] [address] [phone]                 Signed: Cathlyn Parsons 04/15/2022, 5:20 AM

## 2022-04-13 NOTE — Progress Notes (Signed)
Occupational Therapy Session Note  Patient Details  Name: Sabrina [name] MRN: [id number] Date of Birth: 1941/01/24  Today's Date: 04/13/2022 OT Individual Time: 0830-0930 OT Individual Time Calculation (min): 60 min    Short Term Goals: Week 2:  OT Short Term Goal 1 (Week 2): STG=LTG (Due to ELOS)  Skilled Therapeutic Interventions/Progress Updates:    Pt resting in bed upon arrival. Supine>sit EOB with supervision. Pt donned pants with sit<>stand and supervision. Pt required assistance donning shoes. Amb with RW to ortho gym. Standing activities and amb without RW in gym. Activities included: ball toss, ball tap with 2# bar, BUE therex with 2# bar standing on Airex with CGA. Pt amb without RW to retireve items on floor with reacher. Pt stood on Airex to fold wash clothes and gathered wash cloths and towels and amb wihtout AD to place on chair. Following a rest break, pt amb without AD to room with CGA. Pt returned to recliner. Pt remained in recliner. All needs within reach and familyu present.   Therapy Documentation Precautions:  Precautions Precautions: Fall, Other (comment) Precaution Comments: R groin wound vac Restrictions Weight Bearing Restrictions: No Pain:  Pt requested pain meds at beginning of therapy session but states her pain is ok   Therapy/Group: Individual Therapy  Leroy Libman 04/13/2022, 9:34 AM

## 2022-04-13 NOTE — Patient Care Conference (Signed)
Inpatient RehabilitationTeam Conference and Plan of Care Update Date: 04/13/2022   Time: 11:04 AM    Patient Name: Sabrina [name]      Medical Record Number: [id number]  Date of Birth: 03/06/1941 Sex: Female         Room/Bed: 4M10C/4M10C-01 Payor Info: Payor: MEDICARE / Plan: MEDICARE PART A AND B / Product Type: *No Product type* /    Admit Date/Time:  04/05/2022  3:09 PM  Primary Diagnosis:  [facility] Problems: Principal Problem:   Debility    Expected Discharge Date: Expected Discharge Date: 04/16/22  Team Members Present: Physician leading conference: Dr. Courtney Heys Social Worker Present: Loralee Pacas, [place] Nurse Present: Dorthula Nettles, RN PT Present: Excell Seltzer, PT OT Present: Roanna Epley, [place], OT SLP Present: Lillie Columbia, SLP PPS Coordinator present : Gunnar Fusi, SLP     Current Status/Progress Goal Weekly Team Focus  Bowel/Bladder   continent b/b  remain continent  toilet as needed   Swallow/Nutrition/ Hydration             ADL's   bathing/dressing-CGA/supervision; toileting-supervision; functional transfers-supervision  mod I/supervision  education, d/c planning, standing balance   Mobility   Supervision bed mobility, S transfers and gait with RW up to 150 ft, min A car transfer  mod I goals, min A car transfer  family edu, d/c planning, endurance, LE strengthening and ROM, pain management   Communication             Safety/Cognition/ Behavioral Observations            Pain   report pain  < 3  assess pain q 4hr and prn   Skin   wound vac to groin  no new breakdown  assess skin q shift and prn     Discharge Planning:  D/c to home with support from her dtr and SIL. Fam edu scheduled for 04/13/22 at 8am until complete. Pt will discharge to home with wound vac. Pending if pt will d/c with oral abx vs IV abx.   Team Discussion: Discontinue wound vac tomorrow. New dressing orders in place. IV abx discontinued,  oral abx ordered. Will monitor WBC's, possible discontinue PICC line. Increased blood pressure, adjusting medications. Continent B/B. Reports pain. Daughter participating in family education.  Patient on target to meet rehab goals: yes, supervision to mod I goals. Currently supervision overall with some CGA.  *See Care Plan and progress notes for long and short-term goals.   Revisions to Treatment Plan:  Finalizing discharge plans   Teaching Needs: Family education on-going   Current Barriers to Discharge: Wound care  Possible Resolutions to Barriers: Family education [facility] follow-up     Medical Summary Current Status: BP still 170s- but justr made changes to BP regimen; changed wound VAC as of tomorrow to silver hydrofiber; continent- taking prns 1x/day or so; off IV ABX- on Cipro PO now- if labs look OK, in AM, then will d/c PICC  Barriers to Discharge: Home enviroment access/layout;Medical stability;Weight bearing restrictions;Wound care  Barriers to Discharge Comments: going home with daughter and son in law- family ed done Possible Resolutions to [employer] Focus: supervision-CGA- goals are supervision to mod I- likely not going home on IV ABX or wound VAC- d/c Friday 6/23   Continued Need for Acute Rehabilitation Level of Care: The patient requires daily medical management by a physician with specialized training in physical medicine and rehabilitation for the following reasons: Direction of a multidisciplinary physical rehabilitation program to maximize  functional independence : Yes Medical management of patient stability for increased activity during participation in an intensive rehabilitation regime.: Yes Analysis of laboratory values and/or radiology reports with any subsequent need for medication adjustment and/or medical intervention. : Yes   I attest that I was present, lead the team conference, and concur with the assessment and plan of the team.   Cristi Loron 04/13/2022, 2:41 PM

## 2022-04-13 NOTE — Progress Notes (Signed)
Physical Therapy Session Note  Patient Details  Name: Sabrina [name] MRN: [id number] Date of Birth: 1941-09-14  Today's Date: 04/13/2022 PT Individual Time: 1000-1050 PT Individual Time Calculation (min): 50 min   Short Term Goals: Week 1:  PT Short Term Goal 1 (Week 1): =LTG due to ELOS  Skilled Therapeutic Interventions/Progress Updates:    Pt received seated in recliner in room, agreeable to PT session. Pt reports some pain in her R groin area, premedicated prior to start of therapy session. Sit to stand and transfers at Supervision level throughout session without use of AD. Provided HEP for patient and had her perform therex, see link below. Ambulation 2 x 100 ft with RW at Supervision level with seated rest break between bouts of ambulation. Pt left seated in recliner in room with needs in reach at end of session. Pt missed 10 min of scheduled therapy session due to fatigue.  Access Code: [email or web address] URL: [email or web address] Date: 04/13/2022 Prepared by: Excell Seltzer  Exercises - Supine March  - 1 x daily - 7 x weekly - 3 sets - 10 reps - Supine Single Bent Knee Fallout  - 1 x daily - 7 x weekly - 3 sets - 10 reps - Supine Hip Adduction Isometric with Ball  - 1 x daily - 7 x weekly - 3 sets - 10 reps - 5 hold - Supine Heel Slide  - 1 x daily - 7 x weekly - 3 sets - 10 reps - Standing Hip Abduction with Counter Support  - 1 x daily - 7 x weekly - 3 sets - 10 reps - Mini Squat with Counter Support  - 1 x daily - 7 x weekly - 3 sets - 10 reps - Standing March with Counter Support  - 1 x daily - 7 x weekly - 3 sets - 10 reps - Alternating Step Taps with Counter Support  - 1 x daily - 7 x weekly - 3 sets - 10 reps  Therapy Documentation Precautions:  Precautions Precautions: Fall, Other (comment) Precaution Comments: R groin wound vac Restrictions Weight Bearing Restrictions: No General: PT Amount of Missed Time (min): 10 Minutes PT Missed Treatment Reason:  Patient fatigue     Therapy/Group: Individual Therapy   Excell Seltzer, PT, DPT, CSRS 04/13/2022, 12:30 PM

## 2022-04-13 NOTE — Progress Notes (Signed)
Wound vac to be removed tomorrow.  VVS will check her wound tomorrow after vac removed.  Discussed with WOC RN.  She will remove the vac at 10am due to pt's rehab schedule.  Pt discharging on Friday.   Leontine Locket, [facility] 04/13/2022 9:43 AM

## 2022-04-13 NOTE — Plan of Care (Signed)
  Problem: Consults Goal: RH GENERAL PATIENT EDUCATION Description: See Patient Education module for education specifics. Outcome: Progressing Goal: Skin Care Protocol Initiated - if Braden Score 18 or less Description: If consults are not indicated, leave blank or document N/A Outcome: Progressing   Problem: RH SKIN INTEGRITY Goal: RH STG MAINTAIN SKIN INTEGRITY WITH ASSISTANCE Description: STG Maintain Skin Integrity With Mod I Assistance. Outcome: Progressing Goal: RH STG ABLE TO PERFORM INCISION/WOUND CARE W/ASSISTANCE Description: STG Able To Perform Incision/Wound Care With Mod I Assistance. Outcome: Progressing   Problem: RH SAFETY Goal: RH STG ADHERE TO SAFETY PRECAUTIONS W/ASSISTANCE/DEVICE Description: STG Adhere to Safety Precautions With Cues and Reminders. Outcome: Progressing Goal: RH STG DECREASED RISK OF FALL WITH ASSISTANCE Description: STG Decreased Risk of Fall With Mod I Assistance. Outcome: Progressing   Problem: RH KNOWLEDGE DEFICIT GENERAL Goal: RH STG INCREASE KNOWLEDGE OF SELF CARE AFTER HOSPITALIZATION Description: Patient will demonstrate knowledge of self-care management, skin/wound care, medication management with educational materials and handouts provided by staff independently at discharge. Outcome: Progressing

## 2022-04-13 NOTE — Progress Notes (Signed)
PROGRESS NOTE   Subjective/Complaints:  Pt reports daughter coming in for family ed at [place] a lot- somewhat loose.   But only on prn bowel meds.    ROS:  Pt denies SOB, abd pain, CP, N/V/C/- (+) loose stools/D, and vision changes  Objective:   No results found.  Recent Labs    04/12/22 0407  WBC 6.8  HGB 8.4*  HCT 25.5*  PLT 325   Recent Labs    04/11/22 0502 04/12/22 0407  NA 137 138  K 3.4* 3.8  CL 103 105  CO2 26 27  GLUCOSE 93 90  BUN 11 13  CREATININE 1.34* 1.49*  CALCIUM 8.8* 8.9    Intake/Output Summary (Last 24 hours) at 04/13/2022 0834 Last data filed at 04/12/2022 1253 Gross per 24 hour  Intake 236 ml  Output --  Net 236 ml        Physical Exam: Vital Signs Blood pressure (!) 178/64, pulse 65, temperature 97.7 F (36.5 C), temperature source Oral, resp. rate 18, height 5' (1.524 m), weight 71.5 kg, SpO2 98 %.     General: awake, alert, appropriate, sitting up in bed; appears comfortable; NAD HENT: conjugate gaze; oropharynx moist CV: regular rate; no JVD Pulmonary: CTA B/L; no W/R/R- good air movement GI: soft, NT, ND, (+)BS- VAC R groin-  Psychiatric: appropriate; a little irritable this AM Neurological: Ox3 Neurologic: Cranial nerves II through XII intact, motor strength is 5/5 in bilateral deltoid, bicep, tricep, grip, 3- Bilateral hip flexor, knee extensors, 4/5 ankle dorsiflexor and plantar flexor Sensory exam normal sensation to light touch and proprioception in bilateral upper and lower extremities Cerebellar exam normal finger to nose to finger as well as heel to shin in bilateral upper and lower extremities Musculoskeletal: pain with B Hip ROM, no UE swelling or joint deformity, Neg knee or ankle effusion , Continues to have mild tenderness R thigh, improved when lying flat Feet , s/p Right 5th toe amp well healed.  No other deformity , no skin breakdown  Skin- VAC  on R groin- no redness seen, small amount vac output PICC line RUE    Assessment/Plan: 1. Functional deficits which require 3+ hours per day of interdisciplinary therapy in a comprehensive inpatient rehab setting. Physiatrist is providing close team supervision and 24 hour management of active medical problems listed below. Physiatrist and rehab team continue to assess barriers to discharge/monitor patient progress toward functional and medical goals  Care Tool:  Bathing    Body parts bathed by patient: Right arm, Left arm, Chest, Abdomen, Front perineal area, Buttocks, Right upper leg, Left upper leg, Face   Body parts bathed by helper: Left lower leg, Right lower leg     Bathing assist Assist Level: Minimal Assistance - Patient > 75%     Upper Body Dressing/Undressing Upper body dressing   What is the patient wearing?: Hospital gown only    Upper body assist Assist Level: Supervision/Verbal cueing    Lower Body Dressing/Undressing Lower body dressing      What is the patient wearing?: Pants     Lower body assist Assist for lower body dressing: Contact Guard/Touching assist (with reacher)  Toileting Toileting    Toileting assist Assist for toileting: Supervision/Verbal cueing     Transfers Chair/bed transfer  Transfers assist     Chair/bed transfer assist level: Supervision/Verbal cueing     Locomotion Ambulation   Ambulation assist      Assist level: Supervision/Verbal cueing Assistive device: Walker-rolling Max distance: 150'   Walk 10 feet activity   Assist     Assist level: Supervision/Verbal cueing Assistive device: Walker-rolling   Walk 50 feet activity   Assist    Assist level: Supervision/Verbal cueing Assistive device: Walker-rolling    Walk 150 feet activity   Assist    Assist level: Supervision/Verbal cueing Assistive device: Walker-rolling    Walk 10 feet on uneven surface  activity   Assist Walk 10 feet  on uneven surfaces activity did not occur: Safety/medical concerns         Wheelchair     Assist Is the patient using a wheelchair?: No             Wheelchair 50 feet with 2 turns activity    Assist            Wheelchair 150 feet activity     Assist          Blood pressure (!) 178/64, pulse 65, temperature 97.7 F (36.5 C), temperature source Oral, resp. rate 18, height 5' (1.524 m), weight 71.5 kg, SpO2 98 %.  Medical Problem List and Plan: 1. Functional deficits secondary to debility secondary to peripheral vascular disease with debilitating claudication.  Status post bilateral endarterectomies with stent placement 03/17/2022 per Dr. Leotis Pain complicated by right groin wound infection status post right femoral exposure washout debridement redo patch plasty with ipsilateral GSV 03/30/2022.              -patient may not shower due to [facility]             -ELOS/Goals: 7-10d  First day of evaluations- con't PT and OT  -Est discharge 6/23, mod I goals  Con't CIR- PT and OT- team conference today to finalize d/c plans-  2.  Antithrombotics: -DVT/anticoagulation:  Mechanical: Antiembolism stockings, thigh (TED hose) Bilateral lower extremities             -antiplatelet therapy: Aspirin 81 mg daily and Plavix 75 mg daily 3. Pain Management: Oxycodone as needed, pt hesistent to take meds for pain   6/13- add IV morphine 1 mg for VAC changes  6/19- con't regimen- pain usually controlled when she takes meds  6/20- hopefully is done with [facility]- so won't need additional meds for d/c past the ~ 1x/day she's taking it.  4. Mood: Pamelor 10 mg twice daily.  Provide emotional support             -antipsychotic agents: N/A  -6/17 She was seen by Dr. Sima Matas yesterday.  5. Neuropsych: This patient is capable of making decisions on her own behalf. 6. Skin/Wound Care: Routine skin checks 7. Fluids/Electrolytes/Nutrition: Routine in and outs with follow-up chemistries 8.  ID.   Currently maintained on Zosyn for right groin wound infection x6 weeks.  Right groin wound culture rare Pseudomonas aeruginosa.   6/13- will need PICC- order was placed- verify placed  6/14 PICC replaced   6/20- if labs ok in AM, won't go home on IV ABX, so would d/c PICC. Changed to Cipro- ok with ID and pharmacy- will check CBC in AM to make sure WBC doesn't rise.  9.  Postoperative cardiopulmonary arrest/non-STEMI  with history of CAD/CABG.  Patient did receive CPR x2 minutes.  Follow-up cardiology service.  Continue Ranexa 500 mg twice daily.  Plan outpatient follow-up cardiology services  -6/17 denies chest pain 10.  Acute on chronic anemia.  Continue iron supplement.    -6/14 HGB stable at 8.9  6/19- Hb 8.4- overall stable 11.  CKD stage III.  Baseline creatinine 1.42-1.56.  Latest creatinine 2.11 follow-up chemistries  6/13- Cr 1.32- doing well and BUN 10- con't to monitor  -6/18 Cr improved to 1.34, follow  6/19- Cr 1.49- bumps up and down slightly- normal levels for her.   6/20- will recheck in AM- since checking labs 12.  Diastolic congestive heart failure.  Monitor for any signs of fluid overload 13.  Hypertension.  Normodyne 300 mg twice daily,Norvasc 10 mg daily just started 04/05/22, SBP worsened by pain.Enc pain med use.  Monitor with increased mobility  6/13- just added Norvasc back yesterday- 413 systolic this AM, but just got Am meds- monitor closely and titrate meds as required  6/17 Lisinopril 2.5 mg started monitor for response  6/18 Lisinopril increased to 5mg , monitor  6/43- BP 837R systolic- will wait a couple of days before increasing.  14.  Hyperlipidemia.  Zetia 10 mg daily. 16. Hypokalemia  -6/16Kdur 59meq BID today  6/18 K+ 3.4  Kdur 65meq BID, recheck labs tomorrow  6/19- K+ 3.8- doing better- will recheck Thursday   I spent a total of 55   minutes on total care today- >50% coordination of care- due to d/w daughter and son in law, team conference and PA about  pharmacy changes  8 days A FACE TO FACE EVALUATION WAS PERFORMED  Sabrina [name] 04/13/2022, 8:34 AM

## 2022-04-13 NOTE — Progress Notes (Signed)
Occupational Therapy Session Note  Patient Details  Name: Sabrina [name] MRN: [id number] Date of Birth: Mar 23, 1941  Today's Date: 04/13/2022 OT Individual Time: 1330-1445 OT Individual Time Calculation (min): 75 min    Short Term Goals: Week 2:  OT Short Term Goal 1 (Week 2): STG=LTG (Due to ELOS)  Skilled Therapeutic Interventions/Progress Updates:    S: Patient agreeable to participate in OT session. Reports 0/10 pain level at rest and 9/10 pain level in her right upper leg with any type of activity. Pt requested to work on her right leg pain/discomfort    Patient participated in skilled OT session focusing on manual therapy, therapeutic activities and pain management techniques . Therapist assessed right upper leg pain with moderate fascial restrictions noted in the superior knee region/lower quad. Patient will increased tenderness during palpation. Patient reports decreased sensation although she feels pressure. Once increased pressure is applied, she felt pain. Pt reports tenderness proximal to wound vac insertion although no fascial restrictions noted in area. Manual therapy performed with myofascial release completed to address fascial restrictions. ES and heat were applied to area for 10' to help decrease pain level and allow patient to participate in therapy sessions with less pain and discomfort. Education provided on utilizing ice or heat on her right knee after therapy to help manage pain level. Pt verbalized understanding. Passive ROM right knee flexion/extension performed after manual therapy and pain management modalities with patient reporting no pain. Pt requsted to return to bed at end of session and completed functional transfer from recliner to bed with no device and SBA.    P: Work on decreasing pain level and fascial restrictions in right knee area in order to increase comfort level and allow her to participate in therapy sessions with less difficulty.   Therapy  Documentation Precautions:  Precautions Precautions: Fall, Other (comment) Precaution Comments: R groin wound vac Restrictions Weight Bearing Restrictions: No   Therapy/Group: Individual Therapy  Ailene Ravel, OTR/L,CBIS  Supplemental OT - [facility]  04/13/2022, 4:44 PM

## 2022-04-13 NOTE — Progress Notes (Signed)
Occupational Therapy Weekly Progress Note  Patient Details  Name: Sabrina [name] MRN: [id number] Date of Birth: Jun 21, 1941  Beginning of progress report period: April 06, 2022 End of progress report period: April 13, 2022     No STGs initially established 2/2 anticipated pt's short stay in CIR. Pt making steady progress with BADLs and functional transfers. Pt currently requires min A for LB dressing tasks 2/2 wound vac mgmt. All functional transfers and amb with RW at supervision level. Pt's daughter has been present for education and provides the appropriate level of assistance/supervision.   Patient continues to demonstrate the following deficits: {impairments:3041632} and therefore will continue to benefit from skilled OT intervention to enhance overall performance with {ADL/iADL:3041649}.  Patient {LTG progression:3041653}.  {plan of IFOY:7741287}  OT Short Term Goals Week 1:  OT Short Term Goal 1 (Week 1): STG=LTG (Due to ELOS) Week 2:  OT Short Term Goal 1 (Week 2): STG=LTG (Due to ELOS)   Leroy Libman 04/13/2022, 6:39 AM

## 2022-04-14 LAB — BASIC METABOLIC PANEL
Anion gap: 8 (ref 5–15)
BUN: 15 mg/dL (ref 8–23)
CO2: 26 mmol/L (ref 22–32)
Calcium: 9 mg/dL (ref 8.9–10.3)
Chloride: 103 mmol/L (ref 98–111)
Creatinine, Ser: 1.48 mg/dL — ABNORMAL HIGH (ref 0.44–1.00)
GFR, Estimated: 36 mL/min — ABNORMAL LOW (ref >60)
Glucose, Bld: 102 mg/dL — ABNORMAL HIGH (ref 70–99)
Potassium: 3.6 mmol/L (ref 3.5–5.1)
Sodium: 137 mmol/L (ref 135–145)

## 2022-04-14 LAB — CBC WITH DIFFERENTIAL/PLATELET
Abs Immature Granulocytes: 0.01 10*3/uL (ref 0.00–0.07)
Basophils Absolute: 0 10*3/uL (ref 0.0–0.1)
Basophils Relative: 0 %
Eosinophils Absolute: 0.6 10*3/uL — ABNORMAL HIGH (ref 0.0–0.5)
Eosinophils Relative: 9 %
HCT: 26.7 % — ABNORMAL LOW (ref 36.0–46.0)
Hemoglobin: 8.5 g/dL — ABNORMAL LOW (ref 12.0–15.0)
Immature Granulocytes: 0 %
Lymphocytes Relative: 21 %
Lymphs Abs: 1.4 10*3/uL (ref 0.7–4.0)
MCH: 28.9 pg (ref 26.0–34.0)
MCHC: 31.8 g/dL (ref 30.0–36.0)
MCV: 90.8 fL (ref 80.0–100.0)
Monocytes Absolute: 0.7 10*3/uL (ref 0.1–1.0)
Monocytes Relative: 11 %
Neutro Abs: 3.9 10*3/uL (ref 1.7–7.7)
Neutrophils Relative %: 59 %
Platelets: 308 10*3/uL (ref 150–400)
RBC: 2.94 MIL/uL — ABNORMAL LOW (ref 3.87–5.11)
RDW: 15.6 % — ABNORMAL HIGH (ref 11.5–15.5)
WBC: 6.7 10*3/uL (ref 4.0–10.5)
nRBC: 0 % (ref 0.0–0.2)

## 2022-04-14 NOTE — Consult Note (Signed)
[place] Nurse wound follow up Wound type: surgical wound s/p debridement Wound bed: 90% pink, 10% fibrinous tissue Drainage (amount, consistency, odor) scant serosanguineous Periwound: intact Dressing procedure/placement/frequency: VVS PA D/Ced NPWT. Replaced with silver hydrofiber, dry dressing and tape.  Student WOC- [place], RN, [facility] Discussed POC with patient and bedside nurse.  Re consult if needed, will not follow at this time. Thanks  Maelani Yarbro [employer], RN,CWOCN, CNS, [place] [phone])

## 2022-04-14 NOTE — Progress Notes (Signed)
  Progress Note    04/14/2022 8:26 AM * No surgery found *  Subjective:  no major complaints   Vitals:   04/13/22 2124 04/14/22 0633  BP:  140/68  Pulse: 65   Resp:  17  Temp: 98.4 F (36.9 C) 98.2 F (36.8 C)  SpO2: 99% 99%   Physical Exam: Cardiac:  regular Lungs:  non labored Incisions:  right groin incision with pink healthy granulation tissue in wound bed.   Left groin with staples intact and well appearing Abdomen:  obese, soft Neurologic: alert and oriented  CBC    Component Value Date/Time   WBC 6.7 04/14/2022 0252   RBC 2.94 (L) 04/14/2022 0252   HGB 8.5 (L) 04/14/2022 0252   HGB 9.2 (L) 04/13/2014 0952   HCT 26.7 (L) 04/14/2022 0252   HCT 24.7 (L) 04/12/2014 1143   PLT 308 04/14/2022 0252   PLT 182 04/11/2014 0558   MCV 90.8 04/14/2022 0252   MCV 85 03/27/2014 0944   MCH 28.9 04/14/2022 0252   MCHC 31.8 04/14/2022 0252   RDW 15.6 (H) 04/14/2022 0252   RDW 13.7 03/27/2014 0944   LYMPHSABS 1.4 04/14/2022 0252   MONOABS 0.7 04/14/2022 0252   EOSABS 0.6 (H) 04/14/2022 0252   BASOSABS 0.0 04/14/2022 0252    BMET    Component Value Date/Time   NA 137 04/14/2022 0252   NA 139 09/13/2014 0000   NA 134 (L) 04/12/2014 0558   K 3.6 04/14/2022 0252   K 4.4 04/12/2014 0558   CL 103 04/14/2022 0252   CL 101 04/12/2014 0558   CO2 26 04/14/2022 0252   CO2 27 04/12/2014 0558   GLUCOSE 102 (H) 04/14/2022 0252   GLUCOSE 90 04/12/2014 0558   BUN 15 04/14/2022 0252   BUN 18 09/13/2014 0000   BUN 16 04/12/2014 0558   CREATININE 1.48 (H) 04/14/2022 0252   CREATININE 1.12 04/12/2014 0558   CALCIUM 9.0 04/14/2022 0252   CALCIUM 8.5 04/12/2014 0558   GFRNONAA 36 (L) 04/14/2022 0252   GFRNONAA 49 (L) 04/12/2014 0558   GFRAA 50 (L) 03/10/2020 1311   GFRAA 57 (L) 04/12/2014 0558    INR    Component Value Date/Time   INR 1.3 (H) 03/31/2022 0630   INR 1.0 03/27/2014 0944     Intake/Output Summary (Last 24 hours) at 04/14/2022 0826 Last data filed at  04/14/2022 6256 Gross per 24 hour  Intake 832 ml  Output --  Net 832 ml     Assessment/Plan:  81 y.o. female is s/p I&D right groin wound, re exposure of CFA, CFA patch excision, CFA patch plasty with ipsilateral GSV, myriad and VAC placement. Would is healing very nicely. Okay to transition from [facility] to hydrogel daily dressing changes. I removed VAC this morning and applied wet to dry dressing. WOC RN to come around 10 am to redress. Outpatient follow up arranged in 2 weeks for incisional check    Karoline Caldwell, PA-C [facility] [phone] 04/14/2022 8:26 AM

## 2022-04-14 NOTE — Progress Notes (Signed)
Physical Therapy Session Note  Patient Details  Name: Sabrina [name] MRN: [id number] Date of Birth: July 12, 1941  Today's Date: 04/14/2022 PT Individual Time: 1131-1200 PT Individual Time Calculation (min): 29 min   Short Term Goals: Week 1:  PT Short Term Goal 1 (Week 1): =LTG due to ELOS  Skilled Therapeutic Interventions/Progress Updates:     Pt received seated in [facility] and agrees to therapy. No complaint of pain. Sit to stand with RW and cues for hand placement. Pt ambulates x150' to gym with RW and cues for upright gaze to improve posture and balance, and increasing proximity to RW for safety. Pt completes BERG balance test, tas detailed below. Following, Pt ambulates back to room with RW and same cueing. PT also provides education on recommendation for use of RW due to pt's balance deficits, as well as safe handling of RW when transitioning from ambulating to sitting. Pt left seated in recliner with alarm intact and all needs within reach.  Therapy Documentation Precautions:  Precautions Precautions: Fall, Other (comment) Precaution Comments: R groin wound vac Restrictions Weight Bearing Restrictions: No  Balance: Standardized Balance Assessment Standardized Balance Assessment: Berg Balance Test Berg Balance Test Sit to Stand: Able to stand without using hands and stabilize independently Standing Unsupported: Able to stand 2 minutes with supervision Sitting with Back Unsupported but Feet Supported on Floor or Stool: Able to sit safely and securely 2 minutes Stand to Sit: Sits safely with minimal use of hands Transfers: Able to transfer safely, minor use of hands Standing Unsupported with Eyes Closed: Able to stand 10 seconds with supervision Standing Ubsupported with Feet Together: Able to place feet together independently and stand for 1 minute with supervision From Standing, Reach Forward with Outstretched Arm: Can reach forward >12 cm safely (5") From Standing Position,  Pick up Object from Floor: Able to pick up shoe, needs supervision From Standing Position, Turn to Look Behind Over each Shoulder: Turn sideways only but maintains balance Turn 360 Degrees: Needs close supervision or verbal cueing Standing Unsupported, Alternately Place Feet on Step/Stool: Able to stand independently and complete 8 steps >20 seconds Standing Unsupported, One Foot in Front: Able to plae foot ahead of the other independently and hold 30 seconds Standing on One Leg: Tries to lift leg/unable to hold 3 seconds but remains standing independently Total Score: 41    Therapy/Group: Individual Therapy  Breck Coons, PT, DPT 04/14/2022, 5:04 PM

## 2022-04-14 NOTE — Progress Notes (Signed)
Patient ID: Sabrina [name], female   DOB: 1941-04-21, 81 y.o.   MRN: [id number]  SW made contact with pt dtr [name] to discuss [facility] preference. Will accept initial referral from [facility].   SW sent orders to [facility] for HHPT/OT/SN (wound care).  Loralee Pacas, MSW, [place] Office: [phone] Cell: [phone] Fax: [phone]

## 2022-04-14 NOTE — Progress Notes (Signed)
Physical Therapy Session Note  Patient Details  Name: Sabrina [name] MRN: [id number] Date of Birth: 1940/12/27  Today's Date: 04/14/2022 PT Individual Time: 1000-1045; 1415-1500 PT Individual Time Calculation (min): 45 min and 45 min PT Missed Time: 30 min Missed Time Reason: patient fatigue  Short Term Goals: Week 1:  PT Short Term Goal 1 (Week 1): =LTG due to ELOS  Skilled Therapeutic Interventions/Progress Updates:    Session 1: Pt received seated in recliner in room, agreeable to PT session. Pt reports some pain in R groin region, premedicated prior to start of therapy session. Sit to stand and transfers with and without RW at Supervision level throughout session. Ambulation 2 x 150 ft with no AD and CGA for balance, onset of B hip pain with mobility with no AD. Provided kpad at end of session to B hips for pain management. Standing alt L/R 3" step-ups with B handrails and CGA for balance, 2 x 10 reps for LE strengthening. Sidesteps L/R 3 x 10 ft each direction with no AD and CGA for balance. Sit to stand 2 x 10 reps while holding volleyball with CGA for balance with focus on LE strengthening. Seated core and UE strengthening with 1 kg weighted ball: punch-outs, L/R diagonals 2 x 10 reps each. Pt returned to recliner at end of session, left seated in room with needs in reach and family present.  Session 2: Pt received supine in bed, agreeable to PT session. Pt reports ongoing pain in R groin region, premedicated prior to start of therapy session. Bed mobility Supervision. Sit to stand and transfers with no AD at Supervision level throughout session. 6MWT, pt ambulates x 400 ft with RW at Supervision level with no rest breaks (as compared to 178 ft with seated rest break upon last test administration). Pt reports feeling fatigued following test and requests to return to her room. Toilet transfer with no AD and Supervision, setup A for pericare and clothing management. Pt returns to bed at  Supervision level. Pt left seated in bed in room with needs in reach, bed alarm in place. Pt missed 30 min of scheduled therapy session due to fatigue.  Therapy Documentation Precautions:  Precautions Precautions: Fall, Other (comment) Precaution Comments: R groin wound vac Restrictions Weight Bearing Restrictions: No       Therapy/Group: Individual Therapy   Excell Seltzer, PT, DPT, CSRS 04/14/2022, 3:29 PM

## 2022-04-14 NOTE — Plan of Care (Signed)
  Problem: Consults Goal: RH GENERAL PATIENT EDUCATION Description: See Patient Education module for education specifics. Outcome: Progressing Goal: Skin Care Protocol Initiated - if Braden Score 18 or less Description: If consults are not indicated, leave blank or document N/A Outcome: Progressing   Problem: RH SKIN INTEGRITY Goal: RH STG MAINTAIN SKIN INTEGRITY WITH ASSISTANCE Description: STG Maintain Skin Integrity With Mod I Assistance. Outcome: Progressing Goal: RH STG ABLE TO PERFORM INCISION/WOUND CARE W/ASSISTANCE Description: STG Able To Perform Incision/Wound Care With Mod I Assistance. Outcome: Progressing   Problem: RH SAFETY Goal: RH STG ADHERE TO SAFETY PRECAUTIONS W/ASSISTANCE/DEVICE Description: STG Adhere to Safety Precautions With Cues and Reminders. Outcome: Progressing Goal: RH STG DECREASED RISK OF FALL WITH ASSISTANCE Description: STG Decreased Risk of Fall With Mod I Assistance. Outcome: Progressing   Problem: RH KNOWLEDGE DEFICIT GENERAL Goal: RH STG INCREASE KNOWLEDGE OF SELF CARE AFTER HOSPITALIZATION Description: Patient will demonstrate knowledge of self-care management, skin/wound care, medication management with educational materials and handouts provided by staff independently at discharge. Outcome: Progressing

## 2022-04-14 NOTE — Progress Notes (Signed)
Occupational Therapy Session Note  Patient Details  Name: Tocara RAVYN NIKKEL MRN: [id number] Date of Birth: [birth year]  Today's Date: 04/14/2022 OT Individual Time: 0820-0930 OT Individual Time Calculation (min): 70 min    Short Term Goals: Week 2:  OT Short Term Goal 1 (Week 2): STG=LTG (Due to ELOS)  Skilled Therapeutic Interventions/Progress Updates:    OT intervention with focus on bed mobility, sit<>stand, functional transers, functional amb with/without AD, walk-in shower transfers, activity tolerance, and education. Discussed/educated pt regarding energy conservation strategies. Pt verbalized understanding. Pt amb without AD from room to ortho gym with noticeable fatigue near end of session. Additional amb with RW and improved energy. Pt practiced walk-in shower tranfsers with and without AD. Recommended pt use RW to access shower. Pt in agreement. Pt amb with RW to gym and engaged in standing activities tossing ball against mini trampoline with CGA. Pt amb with RW back to room and transferred to recliner. All needs within reach.   Therapy Documentation Precautions:  Precautions Precautions: Fall, Other (comment) Precaution Comments: R groin wound vac Restrictions Weight Bearing Restrictions: No Pain: Pain Assessment Pain Scale: 0-10 Pain Score: 9  Pain Type: Acute pain Pain Location: Groin Pain Orientation: Right Pain Radiating Towards: leg, hip Pain Descriptors / Indicators: Aching Pain Frequency: Constant Pain Onset: On-going Patients Stated Pain Goal: 1 Pain Intervention(s): Meds admin during session  Therapy/Group: Individual Therapy  Leroy Libman 04/14/2022, 9:32 AM

## 2022-04-14 NOTE — Progress Notes (Signed)
PROGRESS NOTE   Subjective/Complaints:  Pt reports happy isn't going home with VAC or IV ABX.  No issues.     ROS:  Pt denies SOB, abd pain, CP, N/V/C/D, and vision changes  Objective:   No results found.  Recent Labs    04/12/22 0407 04/14/22 0252  WBC 6.8 6.7  HGB 8.4* 8.5*  HCT 25.5* 26.7*  PLT 325 308   Recent Labs    04/12/22 0407 04/14/22 0252  NA 138 137  K 3.8 3.6  CL 105 103  CO2 27 26  GLUCOSE 90 102*  BUN 13 15  CREATININE 1.49* 1.48*  CALCIUM 8.9 9.0    Intake/Output Summary (Last 24 hours) at 04/14/2022 1604 Last data filed at 04/14/2022 1315 Gross per 24 hour  Intake 1076 ml  Output --  Net 1076 ml        Physical Exam: Vital Signs Blood pressure (!) 148/60, pulse 61, temperature 98.3 F (36.8 C), temperature source Oral, resp. rate 16, height 5' (1.524 m), weight 72.9 kg, SpO2 98 %.      General: awake, alert, appropriate, sitting up in bed; NAD HENT: conjugate gaze; oropharynx moist CV: regular rate; no JVD Pulmonary: CTA B/L; no W/R/R- good air movement GI: soft, NT, ND, (+)BS Psychiatric: appropriate- not irritable today Neurological: Ox3 Neurologic: Cranial nerves II through XII intact, motor strength is 5/5 in bilateral deltoid, bicep, tricep, grip, 3- Bilateral hip flexor, knee extensors, 4/5 ankle dorsiflexor and plantar flexor Sensory exam normal sensation to light touch and proprioception in bilateral upper and lower extremities Cerebellar exam normal finger to nose to finger as well as heel to shin in bilateral upper and lower extremities Musculoskeletal: pain with B Hip ROM, no UE swelling or joint deformity, Neg knee or ankle effusion , Continues to have mild tenderness R thigh, improved when lying flat Feet , s/p Right 5th toe amp well healed.  No other deformity , no skin breakdown  Skin- VAC on R groin- no redness seen, small amount vac output PICC line RUE  still- will get removed    Assessment/Plan: 1. Functional deficits which require 3+ hours per day of interdisciplinary therapy in a comprehensive inpatient rehab setting. Physiatrist is providing close team supervision and 24 hour management of active medical problems listed below. Physiatrist and rehab team continue to assess barriers to discharge/monitor patient progress toward functional and medical goals  Care Tool:  Bathing    Body parts bathed by patient: Right arm, Left arm, Chest, Abdomen, Front perineal area, Buttocks, Right upper leg, Left upper leg, Face   Body parts bathed by helper: Left lower leg, Right lower leg     Bathing assist Assist Level: Minimal Assistance - Patient > 75%     Upper Body Dressing/Undressing Upper body dressing   What is the patient wearing?: Hospital gown only    Upper body assist Assist Level: Supervision/Verbal cueing    Lower Body Dressing/Undressing Lower body dressing      What is the patient wearing?: Pants     Lower body assist Assist for lower body dressing: Contact Guard/Touching assist (with reacher)     Toileting Toileting  Toileting assist Assist for toileting: Supervision/Verbal cueing     Transfers Chair/bed transfer  Transfers assist     Chair/bed transfer assist level: Supervision/Verbal cueing     Locomotion Ambulation   Ambulation assist      Assist level: Supervision/Verbal cueing Assistive device: Walker-rolling Max distance: 400'   Walk 10 feet activity   Assist     Assist level: Supervision/Verbal cueing Assistive device: Walker-rolling   Walk 50 feet activity   Assist    Assist level: Supervision/Verbal cueing Assistive device: Walker-rolling    Walk 150 feet activity   Assist    Assist level: Supervision/Verbal cueing Assistive device: Walker-rolling    Walk 10 feet on uneven surface  activity   Assist Walk 10 feet on uneven surfaces activity did not occur:  Safety/medical concerns         Wheelchair     Assist Is the patient using a wheelchair?: No             Wheelchair 50 feet with 2 turns activity    Assist            Wheelchair 150 feet activity     Assist          Blood pressure (!) 148/60, pulse 61, temperature 98.3 F (36.8 C), temperature source Oral, resp. rate 16, height 5' (1.524 m), weight 72.9 kg, SpO2 98 %.  Medical Problem List and Plan: 1. Functional deficits secondary to debility secondary to peripheral vascular disease with debilitating claudication.  Status post bilateral endarterectomies with stent placement 03/17/2022 per Dr. Leotis Pain complicated by right groin wound infection status post right femoral exposure washout debridement redo patch plasty with ipsilateral GSV 03/30/2022.              -patient may not shower due to [facility]             -ELOS/Goals: 7-10d  -Est discharge 6/23, mod I goals  Con't CIR PT and OT- d/c 6/23- family training completed 2.  Antithrombotics: -DVT/anticoagulation:  Mechanical: Antiembolism stockings, thigh (TED hose) Bilateral lower extremities             -antiplatelet therapy: Aspirin 81 mg daily and Plavix 75 mg daily 3. Pain Management: Oxycodone as needed, pt hesistent to take meds for pain   6/13- add IV morphine 1 mg for VAC changes  6/19- con't regimen- pain usually controlled when she takes meds  6/20- hopefully is done with [facility]- so won't need additional meds for d/c past the ~ 1x/day she's taking it.   6/21- d/c IV pain meds since doesn't have PICC anymore- removed it and VAC off.  4. Mood: Pamelor 10 mg twice daily.  Provide emotional support             -antipsychotic agents: N/A  -6/17 She was seen by Dr. Sima Matas yesterday.  5. Neuropsych: This patient is capable of making decisions on her own behalf. 6. Skin/Wound Care: Routine skin checks 7. Fluids/Electrolytes/Nutrition: Routine in and outs with follow-up chemistries 8.  ID.  Currently  maintained on Zosyn for right groin wound infection x6 weeks.  Right groin wound culture rare Pseudomonas aeruginosa.   6/13- will need PICC- order was placed- verify placed  6/14 PICC replaced   6/20- if labs ok in AM, won't go home on IV ABX, so would d/c PICC. Changed to Cipro- ok with ID and pharmacy- will check CBC in AM to make sure WBC doesn't rise.   6/21- WBC stable- will  d/c PICC- no IV ABX 9.  Postoperative cardiopulmonary arrest/non-STEMI with history of CAD/CABG.  Patient did receive CPR x2 minutes.  Follow-up cardiology service.  Continue Ranexa 500 mg twice daily.  Plan outpatient follow-up cardiology services  -6/17 denies chest pain 10.  Acute on chronic anemia.  Continue iron supplement.    -6/14 HGB stable at 8.9  6/19- Hb 8.4- overall stable 11.  CKD stage III.  Baseline creatinine 1.42-1.56.  Latest creatinine 2.11 follow-up chemistries  6/13- Cr 1.32- doing well and BUN 10- con't to monitor  -6/18 Cr improved to 1.34, follow  6/19- Cr 1.49- bumps up and down slightly- normal levels for her.   6/20- will recheck in AM- since checking labs  6/21- stable- is at her baseline- the higher end of it 12.  Diastolic congestive heart failure.  Monitor for any signs of fluid overload 13.  Hypertension.  Normodyne 300 mg twice daily,Norvasc 10 mg daily just started 04/05/22, SBP worsened by pain.Enc pain med use.  Monitor with increased mobility  6/13- just added Norvasc back yesterday- 465 systolic this AM, but just got Am meds- monitor closely and titrate meds as required  6/17 Lisinopril 2.5 mg started monitor for response  6/18 Lisinopril increased to 5mg , monitor  0/35- BP 465K systolic- will wait a couple of days before increasing.   6/21- BP much better 148/78- con't to monitor 14.  Hyperlipidemia.  Zetia 10 mg daily. 16. Hypokalemia  -6/16Kdur 31meq BID today  6/18 K+ 3.4  Kdur 41meq BID, recheck labs tomorrow  6/19- K+ 3.8- doing better- will recheck Thursday  6/21- K+  3.6-    I spent a total of 37   minutes on total care today- >50% coordination of care- due to d/w PA about labs, and can d/c PICC and d/w nursing about PICC.    9 days A FACE TO FACE EVALUATION WAS PERFORMED  Sabrina [name] 04/14/2022, 4:04 PM

## 2022-04-15 MED ORDER — NORTRIPTYLINE HCL 10 MG PO CAPS
10.0000 mg | ORAL_CAPSULE | Freq: Two times a day (BID) | ORAL | 1 refills | Status: DC
Start: 2022-04-15 — End: 2022-07-16

## 2022-04-15 MED ORDER — EZETIMIBE 10 MG PO TABS: 10.0000 mg | ORAL_TABLET | Freq: Every day | ORAL | 1 refills | Status: DC

## 2022-04-15 MED ORDER — AMLODIPINE BESYLATE 10 MG PO TABS
10.0000 mg | ORAL_TABLET | Freq: Every day | ORAL | 0 refills | Status: DC
Start: 2022-04-15 — End: 2022-04-26

## 2022-04-15 MED ORDER — CIPROFLOXACIN HCL 500 MG PO TABS: ORAL_TABLET | ORAL | 2 refills | Status: DC

## 2022-04-15 MED ORDER — LISINOPRIL 5 MG PO TABS: 5.0000 mg | ORAL_TABLET | Freq: Every day | ORAL | 0 refills | Status: DC

## 2022-04-15 MED ORDER — RANOLAZINE ER 500 MG PO TB12: 500.0000 mg | ORAL_TABLET | Freq: Two times a day (BID) | ORAL | 3 refills | Status: DC

## 2022-04-15 MED ORDER — LABETALOL HCL 300 MG PO TABS: 300.0000 mg | ORAL_TABLET | Freq: Two times a day (BID) | ORAL | 1 refills | Status: DC

## 2022-04-15 MED ORDER — FERROUS SULFATE 325 (65 FE) MG PO TABS
325.0000 mg | ORAL_TABLET | Freq: Every day | ORAL | 3 refills | Status: DC
Start: 2022-04-15 — End: 2022-09-23

## 2022-04-15 MED ORDER — OXYCODONE-ACETAMINOPHEN 5-325 MG PO TABS: 1.0000 | ORAL_TABLET | ORAL | 0 refills | Status: DC | PRN

## 2022-04-15 MED ORDER — CLOPIDOGREL BISULFATE 75 MG PO TABS: 75.0000 mg | ORAL_TABLET | Freq: Every day | ORAL | 1 refills | Status: DC

## 2022-04-15 NOTE — Progress Notes (Signed)
Physical Therapy Discharge Summary  Patient Details  Name: Sabrina [name] MRN: [id number] Date of Birth: [birth year]   Patient has met 6 of 6 long term goals due to improved activity tolerance, improved balance, improved postural control, increased strength, increased range of motion, decreased pain, and ability to compensate for deficits.  Patient to discharge at an ambulatory level Modified Independent.   Patient's care partner is independent to provide the necessary physical assistance at discharge. Pt's daughter and SIL have completed hands-on family education and are safe to assist pt as needed upon d/c home.  Reasons goals not met: Pt has met all rehab goals.  Recommendation:  Patient will benefit from ongoing skilled PT services in home health setting to continue to advance safe functional mobility, address ongoing impairments in endurance, strength, ROM, safety, balance, pain management, and minimize fall risk.  Equipment: No equipment provided. Pt already owns RW.  Reasons for discharge: treatment goals met and discharge from hospital  Patient/family agrees with progress made and goals achieved: Yes  PT Discharge Precautions/Restrictions Precautions Precautions: Fall Restrictions Weight Bearing Restrictions: No Pain Interference Pain Interference Pain Effect on Sleep: 2. Occasionally Pain Interference with Therapy Activities: 3. Frequently Pain Interference with Day-to-Day Activities: 3. Frequently Vision/Perception  Vision - History Ability to See in Adequate Light: 0 Adequate Perception Perception: Within Functional Limits Praxis Praxis: Intact  Cognition Overall Cognitive Status: Within Functional Limits for tasks assessed Arousal/Alertness: Awake/alert Orientation Level: Oriented X4 Year: 2023 Attention: Selective Selective Attention: Appears intact Memory: Appears intact Awareness: Appears intact Problem Solving: Appears intact Safety/Judgment: Appears  intact Sensation Sensation Light Touch: Impaired Detail Light Touch Impaired Details: Impaired RLE;Impaired LLE (impaired in B thighs and R knee) Hot/Cold: Appears Intact Proprioception: Appears Intact Stereognosis: Not tested Coordination Gross Motor Movements are Fluid and Coordinated: Yes Fine Motor Movements are Fluid and Coordinated: Yes Finger Nose Finger Test: Functional Heel Shin Test: not tested 2/2 pain and hip weakness Motor  Motor Motor: Abnormal postural alignment and control Motor - Discharge Observations: impaired 2/2 ongoing R hip/groin pain  Mobility Bed Mobility Bed Mobility: Rolling Right;Rolling Left;Supine to Sit;Sit to Supine Rolling Right: Independent with assistive device Rolling Left: Independent with assistive device Supine to Sit: Independent with assistive device Sit to Supine: Independent with assistive device Transfers Transfers: Sit to Stand;Stand Pivot Transfers Sit to Stand: Independent with assistive device Stand to Sit: Independent with assistive device Stand Pivot Transfers: Independent with assistive device Transfer (Assistive device): Rolling walker Locomotion  Gait Ambulation: Yes Gait Assistance: Independent with assistive device Gait Distance (Feet): 400 Feet Assistive device: Rolling walker Gait Gait: Yes Gait Pattern: Impaired Gait Pattern: Antalgic Gait velocity: decreased Stairs / Additional Locomotion Stairs: Yes Stairs Assistance: Contact Guard/Touching assist Stair Management Technique: Two rails;Step to pattern Number of Stairs: 1 Height of Stairs: 3 Wheelchair Mobility Wheelchair Mobility: No  Trunk/Postural Assessment  Cervical Assessment Cervical Assessment: Within Functional Limits Thoracic Assessment Thoracic Assessment: Within Functional Limits Lumbar Assessment Lumbar Assessment: Within Functional Limits Postural Control Postural Control: Within Functional Limits  Balance Balance Balance Assessed:  Yes Standardized Balance Assessment Standardized Balance Assessment: Berg Balance Test Berg Balance Test Sit to Stand: Able to stand without using hands and stabilize independently Standing Unsupported: Able to stand 2 minutes with supervision Sitting with Back Unsupported but Feet Supported on Floor or Stool: Able to sit safely and securely 2 minutes Stand to Sit: Sits safely with minimal use of hands Transfers: Able to transfer safely, minor use of hands Standing Unsupported with  Eyes Closed: Able to stand 10 seconds with supervision Standing Ubsupported with Feet Together: Able to place feet together independently and stand for 1 minute with supervision From Standing, Reach Forward with Outstretched Arm: Can reach forward >12 cm safely (5") From Standing Position, Pick up Object from Floor: Able to pick up shoe, needs supervision From Standing Position, Turn to Look Behind Over each Shoulder: Turn sideways only but maintains balance Turn 360 Degrees: Needs close supervision or verbal cueing Standing Unsupported, Alternately Place Feet on Step/Stool: Able to stand independently and complete 8 steps >20 seconds Standing Unsupported, One Foot in Front: Able to plae foot ahead of the other independently and hold 30 seconds Standing on One Leg: Tries to lift leg/unable to hold 3 seconds but remains standing independently Total Score: 41 Static Sitting Balance Static Sitting - Balance Support: No upper extremity supported;Feet supported Static Sitting - Level of Assistance: 7: Independent Dynamic Sitting Balance Dynamic Sitting - Balance Support: No upper extremity supported;Feet supported;During functional activity Dynamic Sitting - Level of Assistance: 6: Modified independent (Device/Increase time) Static Standing Balance Static Standing - Balance Support: No upper extremity supported;During functional activity Static Standing - Level of Assistance: 6: Modified independent (Device/Increase  time) Dynamic Standing Balance Dynamic Standing - Balance Support: Bilateral upper extremity supported;During functional activity Dynamic Standing - Level of Assistance: 6: Modified independent (Device/Increase time) Extremity Assessment  RUE Assessment RUE Assessment: Within Functional Limits LUE Assessment LUE Assessment: Within Functional Limits RLE Assessment RLE Assessment: Exceptions to [facility] Passive Range of Motion (PROM) Comments: decreased hip flex 2/2 pain RLE Strength Right Hip Flexion: 3-/5 Right Knee Flexion: 4/5 Right Knee Extension: 5/5 Right Ankle Dorsiflexion: 5/5 LLE Assessment LLE Assessment: Within Functional Limits LLE Strength Left Hip Flexion: 4/5 Left Knee Flexion: 4/5 Left Knee Extension: 4/5 Left Ankle Dorsiflexion: 4/5     Excell Seltzer, PT, DPT, CSRS 04/15/2022, 7:56 AM

## 2022-04-15 NOTE — Progress Notes (Signed)
Patient ID: Sabrina [name], female   DOB: 07-Aug-1941, 81 y.o.   MRN: [id number]  No IV abx at d/c.   SW spoke with pt dtr [name] to inform on [facility] referral being accepted by [facility] for HHPT/OT/SN (wound care).   SW met with pt to inform on above.   Loralee Pacas, MSW, [place] Office: [phone] Cell: [phone] Fax: [phone]

## 2022-04-15 NOTE — Progress Notes (Signed)
Occupational Therapy Session Note  Patient Details  Name: Sabrina [name] MRN: [id number] Date of Birth: 1941/09/19  Today's Date: 04/15/2022 OT Individual Time: 0174-9449 OT Individual Time Calculation (min): 70 min    Short Term Goals: Week 2:  OT Short Term Goal 1 (Week 2): STG=LTG (Due to ELOS)  Skilled Therapeutic Interventions/Progress Updates:    Pt resting in recliner upon arrival with daughter present. OT intervention with focus on discharge planning, functional amb with RW, dynamic standing balance, activity tolerance, and safety awareness. Pt amb with RW from room to main gym and engaged in standing activities on Aire[employer], including hanging clothes pins on basketball net, removing clothes pins, and cleaning clothes pins before placing in container. Pt completed tasks with close supervision and no LOB noted. Pt amb with RW to day room and maintained balance while bouncing large therapy ball. Pt amb with RW to ortho gym for BUE therex on Scifit-5 mins at level 3. Pt amb with RW to room and returned to recliner. Reviewed recommendations with pt and family. Pt remained in recliner. All needs within reach.   Therapy Documentation Precautions:  Precautions Precautions: Fall Precaution Comments: R groin wound vac Restrictions Weight Bearing Restrictions: No Pain: Pain Assessment Pain Scale: 0-10 Pain Score: 4  Pain Type: Acute pain Pain Location: Groin Pain Orientation: Right Pain Radiating Towards: hip Pain Descriptors / Indicators: Aching Pain Frequency: Constant Pain Onset: On-going Patients Stated Pain Goal: 1 Pain Intervention(s): Medication (See eMAR) Multiple Pain Sites: No    Therapy/Group: Individual Therapy  Leroy Libman 04/15/2022, 9:28 AM

## 2022-04-15 NOTE — Progress Notes (Signed)
Occupational Therapy Discharge Summary  Patient Details  Name: Sabrina [name] MRN: [id number] Date of Birth: 05/01/1941  Patient has met 7 of 7 long term goals due to improved activity tolerance, improved balance, postural control, ability to compensate for deficits, and improved coordination.  Pt made excellent progress with BADLs and functional transfers during this admission. Pt is mod I for bathing/dressing and toileting. Pt is mod I for functional transfers using RW. Pt's daughter has been present and participated in therapy sessions. Patient to discharge at overall Modified Independent level.  Patient's care partner is independent to provide the necessary physical assistance at discharge.    Reasons goals not met: n/a  Recommendation:  Patient will benefit from ongoing skilled OT services in home health setting to continue to advance functional skills in the area of BADL and Reduce care partner burden.  Equipment: No equipment provided  Reasons for discharge: treatment goals met and discharge from hospital  Patient/family agrees with progress made and goals achieved: Yes  OT Discharge ADL ADL Equipment Provided: Reacher, Sock aid, Long-handled sponge, Long-handled shoe horn Eating: Independent Where Assessed-Eating: Chair Grooming: Independent Where Assessed-Grooming: Sitting at sink Upper Body Bathing: Modified independent Where Assessed-Upper Body Bathing: Sitting at sink Lower Body Bathing: Modified independent Where Assessed-Lower Body Bathing: Sitting at sink, Standing at sink Upper Body Dressing: Independent Where Assessed-Upper Body Dressing: Sitting at sink Lower Body Dressing: Modified independent Where Assessed-Lower Body Dressing: Standing at sink, Sitting at sink Toileting: Modified independent Where Assessed-Toileting: Glass blower/designer: Diplomatic Services operational officer Method: Counselling psychologist: Statistician: Chief Financial Officer Method: Heritage manager: Civil engineer, contracting without back Vision Baseline Vision/History: 0 No visual deficits Patient Visual Report: No change from baseline Vision Assessment?: No apparent visual deficits Perception  Perception: Within Functional Limits Praxis Praxis: Intact Cognition Cognition Overall Cognitive Status: Within Functional Limits for tasks assessed Arousal/Alertness: Awake/alert Orientation Level: Place;Person;Situation Person: Oriented Place: Oriented Situation: Oriented Memory: Appears intact Attention: Selective Selective Attention: Appears intact Awareness: Appears intact Problem Solving: Appears intact Safety/Judgment: Appears intact Brief Interview for Mental Status (BIMS) Repetition of Three Words (First Attempt): 3 Temporal Orientation: Year: Correct Temporal Orientation: Month: Accurate within 5 days Temporal Orientation: Day: Correct Recall: "Sock": Yes, no cue required Recall: "Blue": Yes, no cue required Recall: "Bed": Yes, no cue required BIMS Summary Score: 15 Sensation Sensation Light Touch: Appears Intact Hot/Cold: Appears Intact Proprioception: Appears Intact Stereognosis: Not tested Coordination Gross Motor Movements are Fluid and Coordinated: Yes Fine Motor Movements are Fluid and Coordinated: Yes Finger Nose Finger Test: Functional Motor  Motor Motor: Abnormal postural alignment and control    Trunk/Postural Assessment  Cervical Assessment Cervical Assessment: Within Functional Limits Thoracic Assessment Thoracic Assessment: Within Functional Limits Lumbar Assessment Lumbar Assessment: Within Functional Limits Postural Control Postural Control: Within Functional Limits  Balance   Extremity/Trunk Assessment RUE Assessment RUE Assessment: Within Functional Limits LUE Assessment LUE Assessment: Within Functional Limits   Leroy Libman 04/15/2022, 6:28 AM

## 2022-04-15 NOTE — Progress Notes (Signed)
PROGRESS NOTE   Subjective/Complaints:  Pt reports no issues except lost her teeth awhile ago here in hospital when was working with therapy- doesn't know where they went.  LBM this AM   ROS:  Pt denies SOB, abd pain, CP, N/V/C/D, and vision changes  Objective:   No results found.  Recent Labs    04/14/22 0252  WBC 6.7  HGB 8.5*  HCT 26.7*  PLT 308   Recent Labs    04/14/22 0252  NA 137  K 3.6  CL 103  CO2 26  GLUCOSE 102*  BUN 15  CREATININE 1.48*  CALCIUM 9.0    Intake/Output Summary (Last 24 hours) at 04/15/2022 0849 Last data filed at 04/15/2022 0725 Gross per 24 hour  Intake 1196 ml  Output 0 ml  Net 1196 ml        Physical Exam: Vital Signs Blood pressure 140/67, pulse 72, temperature 98.5 F (36.9 C), resp. rate 16, height 5' (1.524 m), weight 76.3 kg, SpO2 100 %.      General: awake, alert, appropriate, sitting up in bed; finished oatmeal- only thing on tray; NAD HENT: conjugate gaze; oropharynx moist CV: regular rate; no JVD Pulmonary: CTA B/L; no W/R/R- good air movement GI: soft, NT, ND, (+)BS Psychiatric: appropriate- bright affect Neurological: Ox3  Neurologic: Cranial nerves II through XII intact, motor strength is 5/5 in bilateral deltoid, bicep, tricep, grip, 3- Bilateral hip flexor, knee extensors, 4/5 ankle dorsiflexor and plantar flexor Sensory exam normal sensation to light touch and proprioception in bilateral upper and lower extremities Cerebellar exam normal finger to nose to finger as well as heel to shin in bilateral upper and lower extremities Musculoskeletal: pain with B Hip ROM, no UE swelling or joint deformity, Neg knee or ankle effusion , Continues to have mild tenderness R thigh, improved when lying flat Feet , s/p Right 5th toe amp well healed.  No other deformity , no skin breakdown  Skin- VAC on R groin- no redness seen, small amount vac output PICC line RUE  still- will get removed    Assessment/Plan: 1. Functional deficits which require 3+ hours per day of interdisciplinary therapy in a comprehensive inpatient rehab setting. Physiatrist is providing close team supervision and 24 hour management of active medical problems listed below. Physiatrist and rehab team continue to assess barriers to discharge/monitor patient progress toward functional and medical goals  Care Tool:  Bathing    Body parts bathed by patient: Right arm, Left arm, Chest, Abdomen, Front perineal area, Buttocks, Right upper leg, Left upper leg, Face, Right lower leg, Left lower leg   Body parts bathed by helper: Left lower leg, Right lower leg     Bathing assist Assist Level: Independent with assistive device     Upper Body Dressing/Undressing Upper body dressing   What is the patient wearing?: Pull over shirt    Upper body assist Assist Level: Independent    Lower Body Dressing/Undressing Lower body dressing      What is the patient wearing?: Pants     Lower body assist Assist for lower body dressing: Independent with assitive device     Toileting Toileting  Toileting assist Assist for toileting: Independent with assistive device     Transfers Chair/bed transfer  Transfers assist     Chair/bed transfer assist level: Supervision/Verbal cueing     Locomotion Ambulation   Ambulation assist      Assist level: Supervision/Verbal cueing Assistive device: Walker-rolling Max distance: 400'   Walk 10 feet activity   Assist     Assist level: Supervision/Verbal cueing Assistive device: Walker-rolling   Walk 50 feet activity   Assist    Assist level: Supervision/Verbal cueing Assistive device: Walker-rolling    Walk 150 feet activity   Assist    Assist level: Supervision/Verbal cueing Assistive device: Walker-rolling    Walk 10 feet on uneven surface  activity   Assist Walk 10 feet on uneven surfaces activity did  not occur: Safety/medical concerns         Wheelchair     Assist Is the patient using a wheelchair?: No             Wheelchair 50 feet with 2 turns activity    Assist            Wheelchair 150 feet activity     Assist          Blood pressure 140/67, pulse 72, temperature 98.5 F (36.9 C), resp. rate 16, height 5' (1.524 m), weight 76.3 kg, SpO2 100 %.  Medical Problem List and Plan: 1. Functional deficits secondary to debility secondary to peripheral vascular disease with debilitating claudication.  Status post bilateral endarterectomies with stent placement 03/17/2022 per Dr. Festus Barren complicated by right groin wound infection status post right femoral exposure washout debridement redo patch plasty with ipsilateral GSV 03/30/2022.              -patient may not shower due to [facility]             -ELOS/Goals: 7-10d  -Est discharge 6/23, mod I goals  Con't CIR- PT and OT- d/c tomorrow 2.  Antithrombotics: -DVT/anticoagulation:  Mechanical: Antiembolism stockings, thigh (TED hose) Bilateral lower extremities             -antiplatelet therapy: Aspirin 81 mg daily and Plavix 75 mg daily 3. Pain Management: Oxycodone as needed, pt hesistent to take meds for pain   6/13- add IV morphine 1 mg for VAC changes  6/19- con't regimen- pain usually controlled when she takes meds  6/20- hopefully is done with [facility]- so won't need additional meds for d/c past the ~ 1x/day she's taking it.   6/21- d/c IV pain meds since doesn't have PICC anymore- removed it and VAC off.  4. Mood: Pamelor 10 mg twice daily.  Provide emotional support             -antipsychotic agents: N/A  -6/17 She was seen by Dr. Kieth Brightly yesterday.  5. Neuropsych: This patient is capable of making decisions on her own behalf. 6. Skin/Wound Care: Routine skin checks  6/22- VAC off- using silver fiber that can be changed M/W/F or thereabouts- will need H/H for following wound in R groin.  7.  Fluids/Electrolytes/Nutrition: Routine in and outs with follow-up chemistries 8.  ID.  Currently maintained on Zosyn for right groin wound infection x6 weeks.  Right groin wound culture rare Pseudomonas aeruginosa.   6/13- will need PICC- order was placed- verify placed  6/14 PICC replaced   6/20- if labs ok in AM, won't go home on IV ABX, so would d/c PICC. Changed to Cipro- ok with ID  and pharmacy- will check CBC in AM to make sure WBC doesn't rise.   6/21- WBC stable- will d/c PICC- no IV ABX 9.  Postoperative cardiopulmonary arrest/non-STEMI with history of CAD/CABG.  Patient did receive CPR x2 minutes.  Follow-up cardiology service.  Continue Ranexa 500 mg twice daily.  Plan outpatient follow-up cardiology services  -6/17 denies chest pain 10.  Acute on chronic anemia.  Continue iron supplement.    -6/14 HGB stable at 8.9  6/19- Hb 8.4- overall stable 11.  CKD stage III.  Baseline creatinine 1.42-1.56.  Latest creatinine 2.11 follow-up chemistries  6/13- Cr 1.32- doing well and BUN 10- con't to monitor  -6/18 Cr improved to 1.34, follow  6/19- Cr 1.49- bumps up and down slightly- normal levels for her.   6/20- will recheck in AM- since checking labs  6/21- stable- is at her baseline- the higher end of it 12.  Diastolic congestive heart failure.  Monitor for any signs of fluid overload 13.  Hypertension.  Normodyne 300 mg twice daily,Norvasc 10 mg daily just started 04/05/22, SBP worsened by pain.Enc pain med use.  Monitor with increased mobility  6/13- just added Norvasc back yesterday- 188 systolic this AM, but just got Am meds- monitor closely and titrate meds as required  6/17 Lisinopril 2.5 mg started monitor for response  6/18 Lisinopril increased to 5mg , monitor  6/19- BP 170s systolic- will wait a couple of days before increasing.   6/21- BP much better 148/78- con't to monitor  6/22- BP coming down- 140/67 this AM- con't regimen 14.  Hyperlipidemia.  Zetia 10 mg daily. 16.  Hypokalemia  -6/16Kdur BID today  6/18 K+ 3.4  Kdur BID, recheck labs tomorrow  6/19- K+ 3.8- doing better- will recheck Thursday  6/21- K+ 3.6-       10 days A FACE TO FACE EVALUATION WAS PERFORMED  Curties Conigliaro 04/15/2022, 8:49 AM

## 2022-04-15 NOTE — Progress Notes (Signed)
Inpatient Rehabilitation Discharge Medication Review by a Pharmacist  A complete drug regimen review was completed for this patient to identify any potential clinically significant medication issues.  High Risk Drug Classes Is patient taking? Indication by Medication  Antipsychotic No   Anticoagulant No   Antibiotic Yes Cipro- wound infection  Opioid Yes Percocet prn - pain  Antiplatelet Yes Aspirin and Plavix - s/p endarterectomy  Hypoglycemics/insulin No   Vasoactive Medication Yes Amlodipine, lisinopril, Labetalol - blood pressure Ranolazine - chronic angina  Chemotherapy No   Other Yes Zetia - hyperlipidemia Nortriptyline - chronic headaches Iron - supplement     Type of Medication Issue Identified Description of Issue Recommendation(s)  Drug Interaction(s) (clinically significant)     Duplicate Therapy     Allergy     No Medication Administration End Date     Incorrect Dose     Additional Drug Therapy Needed     Significant med changes from prior encounter (inform family/care partners about these prior to discharge).    Other       Clinically significant medication issues were identified that warrant physician communication and completion of prescribed/recommended actions by midnight of the next day:  No  Time spent performing this drug regimen review (minutes): 30   Naveyah Iacovelli BS, PharmD, BCPS Clinical Pharmacist 04/15/2022 9:02 AM  Contact: [phone] after 3 PM  "Be curious, not judgmental..." -Jamal Maes

## 2022-04-15 NOTE — Plan of Care (Signed)
  Problem: Consults Goal: RH GENERAL PATIENT EDUCATION Description: See Patient Education module for education specifics. Outcome: Progressing Goal: Skin Care Protocol Initiated - if Braden Score 18 or less Description: If consults are not indicated, leave blank or document N/A Outcome: Progressing   Problem: RH SKIN INTEGRITY Goal: RH STG MAINTAIN SKIN INTEGRITY WITH ASSISTANCE Description: STG Maintain Skin Integrity With Mod I Assistance. Outcome: Progressing Goal: RH STG ABLE TO PERFORM INCISION/WOUND CARE W/ASSISTANCE Description: STG Able To Perform Incision/Wound Care With Mod I Assistance. Outcome: Progressing   Problem: RH SAFETY Goal: RH STG ADHERE TO SAFETY PRECAUTIONS W/ASSISTANCE/DEVICE Description: STG Adhere to Safety Precautions With Cues and Reminders. Outcome: Progressing Goal: RH STG DECREASED RISK OF FALL WITH ASSISTANCE Description: STG Decreased Risk of Fall With Mod I Assistance. Outcome: Progressing   Problem: RH KNOWLEDGE DEFICIT GENERAL Goal: RH STG INCREASE KNOWLEDGE OF SELF CARE AFTER HOSPITALIZATION Description: Patient will demonstrate knowledge of self-care management, skin/wound care, medication management with educational materials and handouts provided by staff independently at discharge. Outcome: Progressing

## 2022-04-17 DIAGNOSIS — E669 Obesity, unspecified: Secondary | ICD-10-CM | POA: Diagnosis not present

## 2022-04-17 DIAGNOSIS — B965 Pseudomonas (aeruginosa) (mallei) (pseudomallei) as the cause of diseases classified elsewhere: Secondary | ICD-10-CM | POA: Diagnosis not present

## 2022-04-17 DIAGNOSIS — Z8616 Personal history of COVID-19: Secondary | ICD-10-CM | POA: Diagnosis not present

## 2022-04-17 DIAGNOSIS — Z951 Presence of aortocoronary bypass graft: Secondary | ICD-10-CM | POA: Diagnosis not present

## 2022-04-17 DIAGNOSIS — Z792 Long term (current) use of antibiotics: Secondary | ICD-10-CM | POA: Diagnosis not present

## 2022-04-17 DIAGNOSIS — I5032 Chronic diastolic (congestive) heart failure: Secondary | ICD-10-CM | POA: Diagnosis not present

## 2022-04-17 DIAGNOSIS — E785 Hyperlipidemia, unspecified: Secondary | ICD-10-CM | POA: Diagnosis not present

## 2022-04-17 DIAGNOSIS — M47812 Spondylosis without myelopathy or radiculopathy, cervical region: Secondary | ICD-10-CM | POA: Diagnosis not present

## 2022-04-17 DIAGNOSIS — Z955 Presence of coronary angioplasty implant and graft: Secondary | ICD-10-CM | POA: Diagnosis not present

## 2022-04-17 DIAGNOSIS — I130 Hypertensive heart and chronic kidney disease with heart failure and stage 1 through stage 4 chronic kidney disease, or unspecified chronic kidney disease: Secondary | ICD-10-CM | POA: Diagnosis not present

## 2022-04-17 DIAGNOSIS — Z6831 Body mass index (BMI) 31.0-31.9, adult: Secondary | ICD-10-CM | POA: Diagnosis not present

## 2022-04-17 DIAGNOSIS — I083 Combined rheumatic disorders of mitral, aortic and tricuspid valves: Secondary | ICD-10-CM | POA: Diagnosis not present

## 2022-04-17 DIAGNOSIS — Z7902 Long term (current) use of antithrombotics/antiplatelets: Secondary | ICD-10-CM | POA: Diagnosis not present

## 2022-04-17 DIAGNOSIS — T8149XA Infection following a procedure, other surgical site, initial encounter: Secondary | ICD-10-CM | POA: Diagnosis not present

## 2022-04-17 DIAGNOSIS — K219 Gastro-esophageal reflux disease without esophagitis: Secondary | ICD-10-CM | POA: Diagnosis not present

## 2022-04-17 DIAGNOSIS — M48062 Spinal stenosis, lumbar region with neurogenic claudication: Secondary | ICD-10-CM | POA: Diagnosis not present

## 2022-04-17 DIAGNOSIS — G894 Chronic pain syndrome: Secondary | ICD-10-CM | POA: Diagnosis not present

## 2022-04-17 DIAGNOSIS — N2581 Secondary hyperparathyroidism of renal origin: Secondary | ICD-10-CM | POA: Diagnosis not present

## 2022-04-17 DIAGNOSIS — I739 Peripheral vascular disease, unspecified: Secondary | ICD-10-CM | POA: Diagnosis not present

## 2022-04-17 DIAGNOSIS — I272 Pulmonary hypertension, unspecified: Secondary | ICD-10-CM | POA: Diagnosis not present

## 2022-04-17 DIAGNOSIS — F419 Anxiety disorder, unspecified: Secondary | ICD-10-CM | POA: Diagnosis not present

## 2022-04-17 DIAGNOSIS — N183 Chronic kidney disease, stage 3 unspecified: Secondary | ICD-10-CM | POA: Diagnosis not present

## 2022-04-17 DIAGNOSIS — Z89421 Acquired absence of other right toe(s): Secondary | ICD-10-CM | POA: Diagnosis not present

## 2022-04-17 DIAGNOSIS — I2510 Atherosclerotic heart disease of native coronary artery without angina pectoris: Secondary | ICD-10-CM | POA: Diagnosis not present

## 2022-04-17 DIAGNOSIS — D631 Anemia in chronic kidney disease: Secondary | ICD-10-CM | POA: Diagnosis not present

## 2022-04-19 ENCOUNTER — Telehealth: Payer: Self-pay

## 2022-04-19 DIAGNOSIS — B965 Pseudomonas (aeruginosa) (mallei) (pseudomallei) as the cause of diseases classified elsewhere: Secondary | ICD-10-CM | POA: Diagnosis not present

## 2022-04-19 DIAGNOSIS — I130 Hypertensive heart and chronic kidney disease with heart failure and stage 1 through stage 4 chronic kidney disease, or unspecified chronic kidney disease: Secondary | ICD-10-CM | POA: Diagnosis not present

## 2022-04-19 DIAGNOSIS — I739 Peripheral vascular disease, unspecified: Secondary | ICD-10-CM | POA: Diagnosis not present

## 2022-04-19 DIAGNOSIS — I2510 Atherosclerotic heart disease of native coronary artery without angina pectoris: Secondary | ICD-10-CM | POA: Diagnosis not present

## 2022-04-19 DIAGNOSIS — I5032 Chronic diastolic (congestive) heart failure: Secondary | ICD-10-CM | POA: Diagnosis not present

## 2022-04-19 DIAGNOSIS — T8149XA Infection following a procedure, other surgical site, initial encounter: Secondary | ICD-10-CM | POA: Diagnosis not present

## 2022-04-20 DIAGNOSIS — I739 Peripheral vascular disease, unspecified: Secondary | ICD-10-CM | POA: Diagnosis not present

## 2022-04-20 DIAGNOSIS — T8149XA Infection following a procedure, other surgical site, initial encounter: Secondary | ICD-10-CM | POA: Diagnosis not present

## 2022-04-20 DIAGNOSIS — I5032 Chronic diastolic (congestive) heart failure: Secondary | ICD-10-CM | POA: Diagnosis not present

## 2022-04-20 DIAGNOSIS — I130 Hypertensive heart and chronic kidney disease with heart failure and stage 1 through stage 4 chronic kidney disease, or unspecified chronic kidney disease: Secondary | ICD-10-CM | POA: Diagnosis not present

## 2022-04-20 DIAGNOSIS — I2510 Atherosclerotic heart disease of native coronary artery without angina pectoris: Secondary | ICD-10-CM | POA: Diagnosis not present

## 2022-04-20 DIAGNOSIS — B965 Pseudomonas (aeruginosa) (mallei) (pseudomallei) as the cause of diseases classified elsewhere: Secondary | ICD-10-CM | POA: Diagnosis not present

## 2022-04-21 ENCOUNTER — Other Ambulatory Visit: Payer: Self-pay | Admitting: Otolaryngology

## 2022-04-21 DIAGNOSIS — K118 Other diseases of salivary glands: Secondary | ICD-10-CM

## 2022-04-22 DIAGNOSIS — T8149XA Infection following a procedure, other surgical site, initial encounter: Secondary | ICD-10-CM | POA: Diagnosis not present

## 2022-04-22 DIAGNOSIS — I739 Peripheral vascular disease, unspecified: Secondary | ICD-10-CM | POA: Diagnosis not present

## 2022-04-22 DIAGNOSIS — I5032 Chronic diastolic (congestive) heart failure: Secondary | ICD-10-CM | POA: Diagnosis not present

## 2022-04-22 DIAGNOSIS — I2510 Atherosclerotic heart disease of native coronary artery without angina pectoris: Secondary | ICD-10-CM | POA: Diagnosis not present

## 2022-04-22 DIAGNOSIS — I130 Hypertensive heart and chronic kidney disease with heart failure and stage 1 through stage 4 chronic kidney disease, or unspecified chronic kidney disease: Secondary | ICD-10-CM | POA: Diagnosis not present

## 2022-04-22 DIAGNOSIS — B965 Pseudomonas (aeruginosa) (mallei) (pseudomallei) as the cause of diseases classified elsewhere: Secondary | ICD-10-CM | POA: Diagnosis not present

## 2022-04-23 DIAGNOSIS — T8149XA Infection following a procedure, other surgical site, initial encounter: Secondary | ICD-10-CM | POA: Diagnosis not present

## 2022-04-23 DIAGNOSIS — I5032 Chronic diastolic (congestive) heart failure: Secondary | ICD-10-CM | POA: Diagnosis not present

## 2022-04-23 DIAGNOSIS — I130 Hypertensive heart and chronic kidney disease with heart failure and stage 1 through stage 4 chronic kidney disease, or unspecified chronic kidney disease: Secondary | ICD-10-CM | POA: Diagnosis not present

## 2022-04-23 DIAGNOSIS — I739 Peripheral vascular disease, unspecified: Secondary | ICD-10-CM | POA: Diagnosis not present

## 2022-04-23 DIAGNOSIS — I2510 Atherosclerotic heart disease of native coronary artery without angina pectoris: Secondary | ICD-10-CM | POA: Diagnosis not present

## 2022-04-23 DIAGNOSIS — B965 Pseudomonas (aeruginosa) (mallei) (pseudomallei) as the cause of diseases classified elsewhere: Secondary | ICD-10-CM | POA: Diagnosis not present

## 2022-04-26 ENCOUNTER — Ambulatory Visit (INDEPENDENT_AMBULATORY_CARE_PROVIDER_SITE_OTHER): Payer: Medicare Other | Admitting: Internal Medicine

## 2022-04-26 DIAGNOSIS — I10 Essential (primary) hypertension: Secondary | ICD-10-CM

## 2022-04-26 DIAGNOSIS — I701 Atherosclerosis of renal artery: Secondary | ICD-10-CM | POA: Diagnosis not present

## 2022-04-26 DIAGNOSIS — R519 Headache, unspecified: Secondary | ICD-10-CM | POA: Diagnosis not present

## 2022-04-26 DIAGNOSIS — D62 Acute posthemorrhagic anemia: Secondary | ICD-10-CM

## 2022-04-26 DIAGNOSIS — D631 Anemia in chronic kidney disease: Secondary | ICD-10-CM | POA: Diagnosis not present

## 2022-04-26 DIAGNOSIS — I2510 Atherosclerotic heart disease of native coronary artery without angina pectoris: Secondary | ICD-10-CM | POA: Diagnosis not present

## 2022-04-26 DIAGNOSIS — I130 Hypertensive heart and chronic kidney disease with heart failure and stage 1 through stage 4 chronic kidney disease, or unspecified chronic kidney disease: Secondary | ICD-10-CM

## 2022-04-26 DIAGNOSIS — K219 Gastro-esophageal reflux disease without esophagitis: Secondary | ICD-10-CM | POA: Diagnosis not present

## 2022-04-26 DIAGNOSIS — T8149XA Infection following a procedure, other surgical site, initial encounter: Secondary | ICD-10-CM | POA: Diagnosis not present

## 2022-04-26 DIAGNOSIS — N1831 Chronic kidney disease, stage 3a: Secondary | ICD-10-CM

## 2022-04-26 DIAGNOSIS — I739 Peripheral vascular disease, unspecified: Secondary | ICD-10-CM | POA: Diagnosis not present

## 2022-04-26 DIAGNOSIS — N183 Chronic kidney disease, stage 3 unspecified: Secondary | ICD-10-CM | POA: Diagnosis not present

## 2022-04-26 DIAGNOSIS — I714 Abdominal aortic aneurysm, without rupture, unspecified: Secondary | ICD-10-CM

## 2022-04-26 DIAGNOSIS — B965 Pseudomonas (aeruginosa) (mallei) (pseudomallei) as the cause of diseases classified elsewhere: Secondary | ICD-10-CM | POA: Diagnosis not present

## 2022-04-26 DIAGNOSIS — I5032 Chronic diastolic (congestive) heart failure: Secondary | ICD-10-CM | POA: Diagnosis not present

## 2022-04-26 MED ORDER — CLOPIDOGREL BISULFATE 75 MG PO TABS: 75.0000 mg | ORAL_TABLET | Freq: Every day | ORAL | 1 refills | Status: DC

## 2022-04-26 MED ORDER — AMLODIPINE BESYLATE 10 MG PO TABS
10.0000 mg | ORAL_TABLET | Freq: Every day | ORAL | 1 refills | Status: DC
Start: 2022-04-26 — End: 2022-06-01

## 2022-04-26 MED ORDER — EZETIMIBE 10 MG PO TABS
10.0000 mg | ORAL_TABLET | Freq: Every day | ORAL | 1 refills | Status: DC
Start: 2022-04-26 — End: 2022-11-09

## 2022-04-26 MED ORDER — PANTOPRAZOLE SODIUM 40 MG PO TBEC: DELAYED_RELEASE_TABLET | ORAL | 1 refills | Status: DC

## 2022-04-26 NOTE — Patient Instructions (Signed)
Taking an antibiotic can create an imbalance in the normal population of bacteria that live in the small intestine.  This imbalance can persist for 3 months.   Taking a probiotic ( Align, Floraque or Culturelle), the generic version of one of these over the counter medications, or an alternative form   Yogurt,for a minimum of 3 weeks may help prevent a serious antibiotic associated diarrhea  Called clostridium dificile colitis that occurs when the bacteria population is altered .  Taking a probiotic may also prevent vaginitis due to yeast infections and can be continued indefinitely if you feel that it improves your digestion or your elimination (bowels).      Adding protonix in the morning BEFORE breakfast  Take the iron with a meal (morning or evening )   Take the amlodipine , plavix and zetia at night with the labetalol, ranexa , pamelor  at night  Take the lisinopril,  labetalol  aspirin pamelor   and cipro (antibiotic)    with breakfast

## 2022-04-26 NOTE — Progress Notes (Unsigned)
Subjective:  Patient ID: Sabrina [name], female    DOB: 1941/06/09  Age: 81 y.o. MRN: [id number]  CC: There were no encounter diagnoses.   HPI Sabrina [name] presents for HOSPITAL follow up     Outpatient Medications Prior to Visit  Medication Sig Dispense Refill   acetaminophen (TYLENOL) 325 MG tablet Take 1-2 tablets (325-650 mg total) by mouth every 4 (four) hours as needed for mild pain (or temp >/= 101 F).     amLODipine (NORVASC) 10 MG tablet Take 1 tablet (10 mg total) by mouth daily. 30 tablet 0   aspirin 81 MG tablet Take 81 mg by mouth daily.     ciprofloxacin (CIPRO) 500 MG tablet 1 tab daily through 05/10/2022. 30 tablet 2   clopidogrel (PLAVIX) 75 MG tablet Take 1 tablet (75 mg total) by mouth daily. To prevent strokes 90 tablet 1   ezetimibe (ZETIA) 10 MG tablet Take 1 tablet (10 mg total) by mouth daily. 90 tablet 1   ferrous sulfate 325 (65 FE) MG tablet Take 1 tablet (325 mg total) by mouth daily with breakfast. 30 tablet 3   labetalol (NORMODYNE) 300 MG tablet Take 1 tablet (300 mg total) by mouth 2 (two) times daily. For hypertension 180 tablet 1   lisinopril (ZESTRIL) 5 MG tablet Take 1 tablet (5 mg total) by mouth daily. 30 tablet 0   nortriptyline (PAMELOR) 10 MG capsule Take 1 capsule (10 mg total) by mouth 2 (two) times daily. For chronic headaches 180 capsule 1   oxyCODONE-acetaminophen (PERCOCET/ROXICET) 5-325 MG tablet Take 1-2 tablets by mouth every 4 (four) hours as needed for moderate pain. 30 tablet 0   ranolazine (RANEXA) 500 MG 12 hr tablet Take 1 tablet (500 mg total) by mouth 2 (two) times daily. 60 tablet 3   No facility-administered medications prior to visit.    Review of Systems;  Patient denies headache, fevers, malaise, unintentional weight loss, skin rash, eye pain, sinus congestion and sinus pain, sore throat, dysphagia,  hemoptysis , cough, dyspnea, wheezing, chest pain, palpitations, orthopnea, edema, abdominal pain, nausea, melena,  diarrhea, constipation, flank pain, dysuria, hematuria, urinary  Frequency, nocturia, numbness, tingling, seizures,  Focal weakness, Loss of consciousness,  Tremor, insomnia, depression, anxiety, and suicidal ideation.      Objective:  There were no vitals taken for this visit.  BP Readings from Last 3 Encounters:  04/16/22 (!) 158/64  04/05/22 (!) 156/55  03/29/22 (!) 153/51    Wt Readings from Last 3 Encounters:  04/15/22 168 lb 4.8 oz (76.3 kg)  04/05/22 163 lb 5.8 oz (74.1 kg)  03/29/22 151 lb 10.8 oz (68.8 kg)    General appearance: alert, cooperative and appears stated age Ears: normal TM's and external ear canals both ears Throat: lips, mucosa, and tongue normal; teeth and gums normal Neck: no adenopathy, no carotid bruit, supple, symmetrical, trachea midline and thyroid not enlarged, symmetric, no tenderness/mass/nodules Back: symmetric, no curvature. ROM normal. No CVA tenderness. Lungs: clear to auscultation bilaterally Heart: regular rate and rhythm, S1, S2 normal, no murmur, click, rub or gallop Abdomen: soft, non-tender; bowel sounds normal; no masses,  no organomegaly Pulses: 2+ and symmetric Skin: Skin color, texture, turgor normal. No rashes or lesions Lymph nodes: Cervical, supraclavicular, and axillary nodes normal.  Lab Results  Component Value Date   HGBA1C 5.9 07/19/2016   HGBA1C 5.8 06/02/2015    Lab Results  Component Value Date   CREATININE 1.48 (H) 04/14/2022   CREATININE 1.49 (  H) 04/12/2022   CREATININE 1.34 (H) 04/11/2022    Lab Results  Component Value Date   WBC 6.7 04/14/2022   HGB 8.5 (L) 04/14/2022   HCT 26.7 (L) 04/14/2022   PLT 308 04/14/2022   GLUCOSE 102 (H) 04/14/2022   CHOL 206 (H) 01/28/2022   TRIG 61 01/28/2022   HDL 68 01/28/2022   LDLDIRECT 109.0 03/05/2016   LDLCALC 123 (H) 01/28/2022   ALT 16 04/06/2022   AST 23 04/06/2022   NA 137 04/14/2022   K 3.6 04/14/2022   CL 103 04/14/2022   CREATININE 1.48 (H) 04/14/2022    BUN 15 04/14/2022   CO2 26 04/14/2022   TSH 7.67 (H) 06/22/2021   INR 1.3 (H) 03/31/2022   HGBA1C 5.9 07/19/2016    [place] EKG SITE RITE  Result Date: 04/05/2022 If Site Rite image not attached, placement could not be confirmed due to current cardiac rhythm.   Assessment & Plan:   Problem List Items Addressed This Visit   None   I spent a total of   minutes with this patient in a face to face visit on the date of this encounter reviewing the last office visit with me on        ,  most recent with patient's cardiologist in    ,  patient'ss diet and eating habits, home blood pressure readings ,  most recent imaging study ,   and post visit ordering of testing and therapeutics.    Follow-up: No follow-ups on file.   Crecencio Mc, MD

## 2022-04-27 NOTE — Assessment & Plan Note (Signed)
Currently  controlled on lisinopril, Labetalol 300 mg bid and amlodipine, significantly less medication than prior to her admission in May .

## 2022-04-27 NOTE — Assessment & Plan Note (Signed)
hemoglobin had normalized prior to her May admission.  She is now taking iron  Lab Results  Component Value Date   WBC 6.7 04/14/2022   HGB 8.5 (L) 04/14/2022   HCT 26.7 (L) 04/14/2022   MCV 90.8 04/14/2022   PLT 308 04/14/2022

## 2022-04-27 NOTE — Assessment & Plan Note (Signed)
GFR has returned to baseline after AKI during hospitalization  Lab Results  Component Value Date   CREATININE 1.48 (H) 04/14/2022

## 2022-04-27 NOTE — Assessment & Plan Note (Addendum)
S/p transfusion during hospitalizations.  Having trouble tolerating use of oral iron due to constipation. If hgb has not improved in 30 days will need referral to hematology for iv iron .    Lab Results  Component Value Date   WBC 6.7 04/14/2022   HGB 8.5 (L) 04/14/2022   HCT 26.7 (L) 04/14/2022   MCV 90.8 04/14/2022   PLT 308 04/14/2022

## 2022-04-27 NOTE — Assessment & Plan Note (Signed)
Likely multifactorial and Occurring for over 5 years. Has seen neurosurgery for ESI without significant relief   Previously unresponsive to she has multiplenortriptyline.  Previously managed with transdermal fentanyl due to lack of improvement with tylenol #4 and tramadol .  NSAIDS are C/I Due to GFR 28. 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

## 2022-04-27 NOTE — Assessment & Plan Note (Signed)
Monitored annually with duplex by Dr Ronalee Belts.  Last seen in June.  < 4 cm, asymptomatic .  Agree with addition of Imdur to keep BP < 324 systolic

## 2022-04-27 NOTE — Assessment & Plan Note (Signed)
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

## 2022-04-28 DIAGNOSIS — T8149XA Infection following a procedure, other surgical site, initial encounter: Secondary | ICD-10-CM | POA: Diagnosis not present

## 2022-04-28 DIAGNOSIS — I2510 Atherosclerotic heart disease of native coronary artery without angina pectoris: Secondary | ICD-10-CM | POA: Diagnosis not present

## 2022-04-28 DIAGNOSIS — I130 Hypertensive heart and chronic kidney disease with heart failure and stage 1 through stage 4 chronic kidney disease, or unspecified chronic kidney disease: Secondary | ICD-10-CM | POA: Diagnosis not present

## 2022-04-28 DIAGNOSIS — I5032 Chronic diastolic (congestive) heart failure: Secondary | ICD-10-CM | POA: Diagnosis not present

## 2022-04-28 DIAGNOSIS — I739 Peripheral vascular disease, unspecified: Secondary | ICD-10-CM | POA: Diagnosis not present

## 2022-04-28 DIAGNOSIS — B965 Pseudomonas (aeruginosa) (mallei) (pseudomallei) as the cause of diseases classified elsewhere: Secondary | ICD-10-CM | POA: Diagnosis not present

## 2022-04-29 ENCOUNTER — Other Ambulatory Visit: Payer: Medicare Other

## 2022-04-29 DIAGNOSIS — B965 Pseudomonas (aeruginosa) (mallei) (pseudomallei) as the cause of diseases classified elsewhere: Secondary | ICD-10-CM | POA: Diagnosis not present

## 2022-04-29 DIAGNOSIS — I2510 Atherosclerotic heart disease of native coronary artery without angina pectoris: Secondary | ICD-10-CM | POA: Diagnosis not present

## 2022-04-29 DIAGNOSIS — I5032 Chronic diastolic (congestive) heart failure: Secondary | ICD-10-CM | POA: Diagnosis not present

## 2022-04-29 DIAGNOSIS — T8149XA Infection following a procedure, other surgical site, initial encounter: Secondary | ICD-10-CM | POA: Diagnosis not present

## 2022-04-29 DIAGNOSIS — I130 Hypertensive heart and chronic kidney disease with heart failure and stage 1 through stage 4 chronic kidney disease, or unspecified chronic kidney disease: Secondary | ICD-10-CM | POA: Diagnosis not present

## 2022-04-29 DIAGNOSIS — I739 Peripheral vascular disease, unspecified: Secondary | ICD-10-CM | POA: Diagnosis not present

## 2022-04-29 NOTE — Progress Notes (Signed)
Office Note    HPI: Sabrina [name] is a 81 y.o. (December 14, 1940) female presenting in follow up s/p right groin exploration, redo CFA patch plasty with ipsilateral GSV.  On exam today, Sabrina [name] was doing well.  She denied claudication, rest pain, ischemic tissue loss.  She is living independently at home with home nursing visiting twice a week.  She denies drainage but does note a few open areas along the right groin incision.  She is not showering, rather sponge bathing.  Wound care twice a week.   The pt is  on a daily aspirin.   Other AC:  plavix The pt is  on medication for hypertension.   The pt is not diabetic.  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 b.) TTE 11/16/2021: EF >55%; mild AS (MPG 9 mmHg)   CAD (coronary artery disease)    a.) s/p 3v CABG 03/29/2000   Carotid artery stenosis    a.) s/p LEFT CEA 09/09/2003. b.) Carotid doppler 56/21/3086: 5-78% LICA, CTO RICA; subclavian stenosis   Cataracts, bilateral    Cervical spondylosis without myelopathy    Chronic diastolic CHF (congestive heart failure), NYHA class 3 (HCC)    a.) TTE 05/27/2016: EF >55%, mild LA enlargement, triv PR, mild MR, mod TR; G3DD. b.) TTE 12/12/2017: EF >55%, mild LVH, BAE, mild MR/PR, mod TR; RVSP 52.8 mmHg. c.) TTE 03/18/2020: EF >55%, BAD, AS (MPG 8.7 mmHg); triv MR, mild TR/PR. d.) TTE 11/16/2021: EF >55%, LVH, G1DD, triv MR, mild PR, mod TR; AS (MPG 9 mmHg); MS (MPG 5 mmHg)   Chronic kidney disease, stage III (moderate) (HCC)    Chronic narcotic use 06/24/2014   Chronic pain  syndrome    GERD (gastroesophageal reflux disease)    History of 2019 novel coronavirus disease (COVID-19) 09/22/2021   Hyperlipidemia    Hypertension    Long term current use of antithrombotics/antiplatelets    a.) on daily DAPT therapy (ASA + clopidogrel)   Lumbar stenosis with neurogenic claudication    Mitral stenosis 11/16/2021   a.) TTE 11/16/2021: EF >55%; mod MS (MPG 5 mmHg)   Osteoarthritis of hip    Pulmonary hypertension ([place]) 12/12/2017   a.) TTE 12/12/2017: mild; RVSP 52.8 mmHg   PVD (peripheral vascular disease) (HCC)    Renal artery stenosis (HCC)    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    Past Surgical History:  Procedure Laterality Date   ABDOMINAL HYSTERECTOMY  1976   CAROTID ARTERY ANGIOPLASTY Left    CAROTID ENDARTERECTOMY Left 09/09/2003   Procedure: CAROTID ENDARTERECTOMY; Location: [facility]; Surgeon: Maura Crandall, MD   COLONOSCOPY WITH PROPOFOL N/A 08/16/2017   Procedure: COLONOSCOPY WITH PROPOFOL;  Surgeon: [name], MD;  Location: [facility];  Service: Endoscopy;  Laterality: N/A;   CORONARY ANGIOPLASTY WITH STENT PLACEMENT  2000   CORONARY ARTERY BYPASS GRAFT N/A 03/29/2000   Procedure: 3v CORONARY ARTERY BYPASS GRAFT; Location: [facility]   CYSTOSCOPY WITH STENT PLACEMENT Bilateral    ENDARTERECTOMY FEMORAL Bilateral 03/17/2022   Procedure: ENDARTERECTOMY FEMORAL ( BILATERAL SFA STENT);  Surgeon: Katha Cabal, MD;  Location: [facility];  Service: Vascular;  Laterality: Bilateral;   ENDARTERECTOMY FEMORAL Right 03/30/2022   Procedure: RE-EXPOSURE OF RIGHT COMMON FEMORAL ARTERY, RE-DO RIGHT FEMORAL ENDARTERECTOMY WITH VEIN PATCH;  Surgeon: Broadus John, MD;  Location: [facility] OR;  Service: Vascular;  Laterality: Right;  WOUND VAC    ESOPHAGOGASTRODUODENOSCOPY (EGD) WITH PROPOFOL N/A 08/16/2017   Procedure: ESOPHAGOGASTRODUODENOSCOPY (EGD) WITH PROPOFOL;  Surgeon: [name], MD;  Location: [facility];  Service: Endoscopy;  Laterality: N/A;   ESOPHAGOGASTRODUODENOSCOPY (EGD) WITH PROPOFOL N/A 06/29/2018   Procedure: ESOPHAGOGASTRODUODENOSCOPY (EGD) WITH PROPOFOL;  Surgeon: Virgel Manifold, MD;  Location: [facility];  Service: Endoscopy;  Laterality: N/A;   GROIN DEBRIDEMENT Right 03/30/2022   Procedure: [place];  Surgeon: Broadus John, MD;  Location: [facility];  Service: Vascular;  Laterality: Right;   INSERTION OF ILIAC STENT Left 03/17/2022   Procedure: INSERTION OF ILIAC STENT;  Surgeon: Katha Cabal, MD;  Location: [facility];  Service: Vascular;  Laterality: Left;   LAPAROSCOPIC CHOLECYSTECTOMY Left 10/26/1999   Procedure: LAPAROSCOPIC CHOLECYSTECTOMY; Location: [facility]; Surgeon: Rochel Brome, MD   LEFT HEART CATH AND CORONARY ANGIOGRAPHY N/A 11/18/2021   Procedure: LEFT HEART CATH AND CORONARY ANGIOGRAPHY;  Surgeon: Yolonda Kida, MD;  Location: [facility];  Service: Cardiovascular;  Laterality: N/A;   LEFT HEART CATH AND CORS/GRAFTS ANGIOGRAPHY Left 11/19/2002   Procedure: LEFT HEART CATH AND CORS/GRAFTS ANGIOGRAPHY; Location: [facility]; Surgeon: Katrine Coho, MD   LEFT HEART CATH AND CORS/GRAFTS ANGIOGRAPHY Left 09/17/2003   Procedure: LEFT HEART CATH AND CORS/GRAFTS ANGIOGRAPHY; Location: [facility]; Surgeon: Katrine Coho, MD   LOWER EXTREMITY ANGIOGRAPHY Right 01/06/2022   Procedure: Lower Extremity Angiography;  Surgeon: Katha Cabal, MD;  Location: [facility];  Service: Cardiovascular;  Laterality: Right;   RENAL ARTERY ANGIOPLASTY Bilateral 12/2013   TOE AMPUTATION Right    small toe   TONSILLECTOMY AND ADENOIDECTOMY     TOTAL HIP ARTHROPLASTY Left 2005   TOTAL HIP ARTHROPLASTY Right 2015   UPPER EXTREMITY ANGIOGRAPHY Left 11/23/2021    Procedure: UPPER EXTREMITY ANGIOGRAPHY;  Surgeon: Algernon Huxley, MD;  Location: [facility];  Service: Cardiovascular;  Laterality: Left;    Social History   Socioeconomic History   Marital status: Divorced    Spouse name: Not on file   Number of children: 3   Years of education: Not on file   Highest education level: Not on file  Occupational History   Occupation: retired  Tobacco Use   Smoking status: Former    Types: Cigarettes    Quit date: 10/25/1998    Years since quitting: 23.5   Smokeless tobacco: Never  Vaping Use   Vaping Use: Never used  Substance and Sexual Activity   Alcohol use: No    Alcohol/week: 0.0 standard drinks of alcohol   Drug use: Never   Sexual activity: Not Currently  Other Topics Concern   Not on file  Social History Narrative   Daughter [name] ([place]); 1 in [employer]; 1 in [place] alone   Social Determinants of Health   Financial Resource Strain: Low Risk  (09/22/2017)   Overall Financial Resource Strain ([place])  Difficulty of Paying Living Expenses: Not hard at all  Food Insecurity: No Food Insecurity (09/22/2017)   Hunger Vital Sign    Worried About Running Out of Food in the Last Year: Never true    Ran Out of Food in the Last Year: Never true  Transportation Needs: No Transportation Needs (09/22/2017)   PRAPARE - Hydrologist (Medical): No    Lack of Transportation (Non-Medical): No  Physical Activity: Inactive (09/22/2017)   Exercise Vital Sign    Days of Exercise per Week: 0 days    Minutes of Exercise per Session: 0 min  Stress: Unknown (09/22/2017)   [place]    Feeling of Stress : Patient refused  Social Connections: Unknown (09/22/2017)   Social Connection and Isolation Panel [NHANES]    Frequency of Communication with Friends and Family: Patient refused    Frequency of Social Gatherings with Friends and Family:  Patient refused    Attends Religious Services: Patient refused    Active Member of Clubs or Organizations: Patient refused    Attends Archivist Meetings: Patient refused    Marital Status: Patient refused  Intimate Partner Violence: Not At Risk (09/22/2017)   Humiliation, Afraid, Rape, and Kick questionnaire    Fear of Current or Ex-Partner: No    Emotionally Abused: No    Physically Abused: No    Sexually Abused: No    Family History  Problem Relation Age of Onset   Stroke Mother    Hypertension Mother    Diabetes Mother    Hypertension Father    Heart disease Sister        MI   Multiple sclerosis Daughter    Multiple sclerosis Son    Cerebral aneurysm Son    Seizures Son    Cerebral aneurysm Son    Breast cancer Paternal Aunt 22    Current Outpatient Medications  Medication Sig Dispense Refill   acetaminophen (TYLENOL) 325 MG tablet Take 1-2 tablets (325-650 mg total) by mouth every 4 (four) hours as needed for mild pain (or temp >/= 101 F).     amLODipine (NORVASC) 10 MG tablet Take 1 tablet (10 mg total) by mouth at bedtime. 90 tablet 1   aspirin 81 MG tablet Take 81 mg by mouth daily.     ciprofloxacin (CIPRO) 500 MG tablet 1 tab daily through 05/10/2022. 30 tablet 2   clopidogrel (PLAVIX) 75 MG tablet Take 1 tablet (75 mg total) by mouth at bedtime. To prevent strokes 90 tablet 1   ezetimibe (ZETIA) 10 MG tablet Take 1 tablet (10 mg total) by mouth at bedtime. 90 tablet 1   ferrous sulfate 325 (65 FE) MG tablet Take 1 tablet (325 mg total) by mouth daily with breakfast. 30 tablet 3   labetalol (NORMODYNE) 300 MG tablet Take 1 tablet (300 mg total) by mouth 2 (two) times daily. For hypertension 180 tablet 1   lisinopril (ZESTRIL) 5 MG tablet Take 1 tablet (5 mg total) by mouth daily. 30 tablet 0   nortriptyline (PAMELOR) 10 MG capsule Take 1 capsule (10 mg total) by mouth 2 (two) times daily. For chronic headaches 180 capsule 1   oxyCODONE-acetaminophen  (PERCOCET/ROXICET) 5-325 MG tablet Take 1-2 tablets by mouth every 4 (four) hours as needed for moderate pain. 30 tablet 0   pantoprazole (PROTONIX) 40 MG tablet One tablet upon waking,  take 30 minuts prior to eating 90 tablet 1  ranolazine (RANEXA) 500 MG 12 hr tablet Take 1 tablet (500 mg total) by mouth 2 (two) times daily. 60 tablet 3   No current facility-administered medications for this visit.    Allergies  Allergen Reactions   Citalopram Anaphylaxis    Throat closing    Dilaudid [Hydromorphone Hcl] Nausea And Vomiting   Hydrochlorothiazide Other (See Comments)    Decreased GFR (Nov 2015)   Liothyronine     Hair fell out, caused headaches    Nsaids     CKD stage III - avoid nephrotoxic drugs   Nubain [Nalbuphine Hcl]     Burning sensation in back   Penicillins Itching   Prasugrel Itching   Statins Itching     REVIEW OF SYSTEMS:   [X]  denotes positive finding, [ ]  denotes negative finding Cardiac  Comments:  Chest pain or chest pressure:    Shortness of breath upon exertion:    Short of breath when lying flat:    Irregular heart rhythm:        Vascular    Pain in calf, thigh, or hip brought on by ambulation:    Pain in feet at night that wakes you up from your sleep:     Blood clot in your veins:    Leg swelling:         Pulmonary    Oxygen at home:    Productive cough:     Wheezing:         Neurologic    Sudden weakness in arms or legs:     Sudden numbness in arms or legs:     Sudden onset of difficulty speaking or slurred speech:    Temporary loss of vision in one eye:     Problems with dizziness:         Gastrointestinal    Blood in stool:     Vomited blood:         Genitourinary    Burning when urinating:     Blood in urine:        Psychiatric    Major depression:         Hematologic    Bleeding problems:    Problems with blood clotting too easily:        Skin    Rashes or ulcers:        Constitutional    Fever or chills:       PHYSICAL EXAMINATION:  There were no vitals filed for this visit.  General:  WDWN in NAD; vital signs documented above Gait: Not observed HENT: WNL, normocephalic Pulmonary: normal non-labored breathing , without wheezing Cardiac: regular HR, Abdomen: soft, NT, no masses Skin: without rashes Vascular Exam/Pulses:  Right Left  Radial 2+ (normal) 2+ (normal)  Ulnar 2+ (normal) 2+ (normal)  Femoral    Popliteal    DP nonpalpable nonpalpable  PT nonpalpable nonpalpable   Extremities: without ischemic changes, without Gangrene , without cellulitis; without open wounds;  Right groin wound with 3 small areas opened, probe less than 1 cm, Musculoskeletal: no muscle wasting or atrophy  Neurologic: A&O X 3;  No focal weakness or paresthesias are detected Psychiatric:  The pt has Normal affect.   Non-Invasive Vascular Imaging:   non    ASSESSMENT/PLAN: Sabrina [name] is a 81 y.o. female presenting with presenting in follow up s/p right groin exploration, redo CFA patch plasty with ipsilateral GSV. Initial procedure was bilateral femoral endarterectomies performed at [facility].  Her left groin is healed  nicely, I asked her to reach out to [facility] for staple removal.  I plan to see her in 1 month as the wound appears to be healing well.  She continues to have nursing come twice weekly.  Once the right groin wound has healed, plan to transition all of her vascular care back to [facility] as she is from [place].   Broadus John, MD [facility] [phone]

## 2022-04-30 ENCOUNTER — Encounter: Payer: Medicare Other | Admitting: Registered Nurse

## 2022-04-30 ENCOUNTER — Encounter: Payer: Self-pay | Admitting: Vascular Surgery

## 2022-04-30 ENCOUNTER — Ambulatory Visit: Payer: Medicare Other | Admitting: Internal Medicine

## 2022-04-30 ENCOUNTER — Ambulatory Visit (INDEPENDENT_AMBULATORY_CARE_PROVIDER_SITE_OTHER): Payer: Medicare Other | Admitting: Vascular Surgery

## 2022-04-30 VITALS — BP 168/71 | HR 61 | Temp 98.3°F | Resp 20 | Ht 60.0 in | Wt 154.0 lb

## 2022-04-30 DIAGNOSIS — I739 Peripheral vascular disease, unspecified: Secondary | ICD-10-CM

## 2022-05-03 ENCOUNTER — Telehealth: Payer: Self-pay | Admitting: Internal Medicine

## 2022-05-03 DIAGNOSIS — I5032 Chronic diastolic (congestive) heart failure: Secondary | ICD-10-CM | POA: Diagnosis not present

## 2022-05-03 DIAGNOSIS — T8149XA Infection following a procedure, other surgical site, initial encounter: Secondary | ICD-10-CM | POA: Diagnosis not present

## 2022-05-03 DIAGNOSIS — B965 Pseudomonas (aeruginosa) (mallei) (pseudomallei) as the cause of diseases classified elsewhere: Secondary | ICD-10-CM | POA: Diagnosis not present

## 2022-05-03 DIAGNOSIS — I739 Peripheral vascular disease, unspecified: Secondary | ICD-10-CM | POA: Diagnosis not present

## 2022-05-03 DIAGNOSIS — I130 Hypertensive heart and chronic kidney disease with heart failure and stage 1 through stage 4 chronic kidney disease, or unspecified chronic kidney disease: Secondary | ICD-10-CM | POA: Diagnosis not present

## 2022-05-03 DIAGNOSIS — I2510 Atherosclerotic heart disease of native coronary artery without angina pectoris: Secondary | ICD-10-CM | POA: Diagnosis not present

## 2022-05-03 NOTE — Telephone Encounter (Signed)
Pt need refill on amlodipine and lisinopril sent to [facility] [address]

## 2022-05-04 DIAGNOSIS — I2510 Atherosclerotic heart disease of native coronary artery without angina pectoris: Secondary | ICD-10-CM | POA: Diagnosis not present

## 2022-05-04 DIAGNOSIS — B965 Pseudomonas (aeruginosa) (mallei) (pseudomallei) as the cause of diseases classified elsewhere: Secondary | ICD-10-CM | POA: Diagnosis not present

## 2022-05-04 DIAGNOSIS — I130 Hypertensive heart and chronic kidney disease with heart failure and stage 1 through stage 4 chronic kidney disease, or unspecified chronic kidney disease: Secondary | ICD-10-CM | POA: Diagnosis not present

## 2022-05-04 DIAGNOSIS — I739 Peripheral vascular disease, unspecified: Secondary | ICD-10-CM | POA: Diagnosis not present

## 2022-05-04 DIAGNOSIS — I5032 Chronic diastolic (congestive) heart failure: Secondary | ICD-10-CM | POA: Diagnosis not present

## 2022-05-04 DIAGNOSIS — T8149XA Infection following a procedure, other surgical site, initial encounter: Secondary | ICD-10-CM | POA: Diagnosis not present

## 2022-05-04 MED ORDER — LISINOPRIL 5 MG PO TABS: 5.0000 mg | ORAL_TABLET | Freq: Every day | ORAL | 1 refills | Status: DC

## 2022-05-04 NOTE — Telephone Encounter (Signed)
Medications have been refilled and pt is aware.

## 2022-05-06 DIAGNOSIS — I130 Hypertensive heart and chronic kidney disease with heart failure and stage 1 through stage 4 chronic kidney disease, or unspecified chronic kidney disease: Secondary | ICD-10-CM | POA: Diagnosis not present

## 2022-05-06 DIAGNOSIS — I739 Peripheral vascular disease, unspecified: Secondary | ICD-10-CM | POA: Diagnosis not present

## 2022-05-06 DIAGNOSIS — I5032 Chronic diastolic (congestive) heart failure: Secondary | ICD-10-CM | POA: Diagnosis not present

## 2022-05-06 DIAGNOSIS — T8149XA Infection following a procedure, other surgical site, initial encounter: Secondary | ICD-10-CM | POA: Diagnosis not present

## 2022-05-06 DIAGNOSIS — B965 Pseudomonas (aeruginosa) (mallei) (pseudomallei) as the cause of diseases classified elsewhere: Secondary | ICD-10-CM | POA: Diagnosis not present

## 2022-05-06 DIAGNOSIS — I2510 Atherosclerotic heart disease of native coronary artery without angina pectoris: Secondary | ICD-10-CM | POA: Diagnosis not present

## 2022-05-07 ENCOUNTER — Ambulatory Visit (INDEPENDENT_AMBULATORY_CARE_PROVIDER_SITE_OTHER): Payer: Medicare Other | Admitting: Nurse Practitioner

## 2022-05-07 ENCOUNTER — Encounter (INDEPENDENT_AMBULATORY_CARE_PROVIDER_SITE_OTHER): Payer: Self-pay | Admitting: Nurse Practitioner

## 2022-05-07 VITALS — BP 122/54 | HR 64 | Resp 16 | Wt 153.6 lb

## 2022-05-07 DIAGNOSIS — T8149XA Infection following a procedure, other surgical site, initial encounter: Secondary | ICD-10-CM | POA: Diagnosis not present

## 2022-05-07 DIAGNOSIS — B965 Pseudomonas (aeruginosa) (mallei) (pseudomallei) as the cause of diseases classified elsewhere: Secondary | ICD-10-CM | POA: Diagnosis not present

## 2022-05-07 DIAGNOSIS — I130 Hypertensive heart and chronic kidney disease with heart failure and stage 1 through stage 4 chronic kidney disease, or unspecified chronic kidney disease: Secondary | ICD-10-CM | POA: Diagnosis not present

## 2022-05-07 DIAGNOSIS — I2510 Atherosclerotic heart disease of native coronary artery without angina pectoris: Secondary | ICD-10-CM | POA: Diagnosis not present

## 2022-05-07 DIAGNOSIS — I739 Peripheral vascular disease, unspecified: Secondary | ICD-10-CM

## 2022-05-07 DIAGNOSIS — Z9889 Other specified postprocedural states: Secondary | ICD-10-CM

## 2022-05-07 DIAGNOSIS — I5032 Chronic diastolic (congestive) heart failure: Secondary | ICD-10-CM | POA: Diagnosis not present

## 2022-05-10 ENCOUNTER — Ambulatory Visit (INDEPENDENT_AMBULATORY_CARE_PROVIDER_SITE_OTHER): Payer: Medicare Other | Admitting: Nurse Practitioner

## 2022-05-11 DIAGNOSIS — I2510 Atherosclerotic heart disease of native coronary artery without angina pectoris: Secondary | ICD-10-CM | POA: Diagnosis not present

## 2022-05-11 DIAGNOSIS — I739 Peripheral vascular disease, unspecified: Secondary | ICD-10-CM | POA: Diagnosis not present

## 2022-05-11 DIAGNOSIS — T8149XA Infection following a procedure, other surgical site, initial encounter: Secondary | ICD-10-CM | POA: Diagnosis not present

## 2022-05-11 DIAGNOSIS — B965 Pseudomonas (aeruginosa) (mallei) (pseudomallei) as the cause of diseases classified elsewhere: Secondary | ICD-10-CM | POA: Diagnosis not present

## 2022-05-11 DIAGNOSIS — I130 Hypertensive heart and chronic kidney disease with heart failure and stage 1 through stage 4 chronic kidney disease, or unspecified chronic kidney disease: Secondary | ICD-10-CM | POA: Diagnosis not present

## 2022-05-11 DIAGNOSIS — I5032 Chronic diastolic (congestive) heart failure: Secondary | ICD-10-CM | POA: Diagnosis not present

## 2022-05-14 DIAGNOSIS — I739 Peripheral vascular disease, unspecified: Secondary | ICD-10-CM | POA: Diagnosis not present

## 2022-05-14 DIAGNOSIS — I2510 Atherosclerotic heart disease of native coronary artery without angina pectoris: Secondary | ICD-10-CM | POA: Diagnosis not present

## 2022-05-14 DIAGNOSIS — I130 Hypertensive heart and chronic kidney disease with heart failure and stage 1 through stage 4 chronic kidney disease, or unspecified chronic kidney disease: Secondary | ICD-10-CM | POA: Diagnosis not present

## 2022-05-14 DIAGNOSIS — I5032 Chronic diastolic (congestive) heart failure: Secondary | ICD-10-CM | POA: Diagnosis not present

## 2022-05-14 DIAGNOSIS — T8149XA Infection following a procedure, other surgical site, initial encounter: Secondary | ICD-10-CM | POA: Diagnosis not present

## 2022-05-14 DIAGNOSIS — B965 Pseudomonas (aeruginosa) (mallei) (pseudomallei) as the cause of diseases classified elsewhere: Secondary | ICD-10-CM | POA: Diagnosis not present

## 2022-05-17 DIAGNOSIS — Z792 Long term (current) use of antibiotics: Secondary | ICD-10-CM | POA: Diagnosis not present

## 2022-05-17 DIAGNOSIS — N183 Chronic kidney disease, stage 3 unspecified: Secondary | ICD-10-CM | POA: Diagnosis not present

## 2022-05-17 DIAGNOSIS — Z955 Presence of coronary angioplasty implant and graft: Secondary | ICD-10-CM | POA: Diagnosis not present

## 2022-05-17 DIAGNOSIS — I739 Peripheral vascular disease, unspecified: Secondary | ICD-10-CM | POA: Diagnosis not present

## 2022-05-17 DIAGNOSIS — I2510 Atherosclerotic heart disease of native coronary artery without angina pectoris: Secondary | ICD-10-CM | POA: Diagnosis not present

## 2022-05-17 DIAGNOSIS — G894 Chronic pain syndrome: Secondary | ICD-10-CM | POA: Diagnosis not present

## 2022-05-17 DIAGNOSIS — N2581 Secondary hyperparathyroidism of renal origin: Secondary | ICD-10-CM | POA: Diagnosis not present

## 2022-05-17 DIAGNOSIS — T8149XA Infection following a procedure, other surgical site, initial encounter: Secondary | ICD-10-CM | POA: Diagnosis not present

## 2022-05-17 DIAGNOSIS — D631 Anemia in chronic kidney disease: Secondary | ICD-10-CM | POA: Diagnosis not present

## 2022-05-17 DIAGNOSIS — Z89421 Acquired absence of other right toe(s): Secondary | ICD-10-CM | POA: Diagnosis not present

## 2022-05-17 DIAGNOSIS — Z951 Presence of aortocoronary bypass graft: Secondary | ICD-10-CM | POA: Diagnosis not present

## 2022-05-17 DIAGNOSIS — Z8616 Personal history of COVID-19: Secondary | ICD-10-CM | POA: Diagnosis not present

## 2022-05-17 DIAGNOSIS — E669 Obesity, unspecified: Secondary | ICD-10-CM | POA: Diagnosis not present

## 2022-05-17 DIAGNOSIS — F419 Anxiety disorder, unspecified: Secondary | ICD-10-CM | POA: Diagnosis not present

## 2022-05-17 DIAGNOSIS — I272 Pulmonary hypertension, unspecified: Secondary | ICD-10-CM | POA: Diagnosis not present

## 2022-05-17 DIAGNOSIS — M48062 Spinal stenosis, lumbar region with neurogenic claudication: Secondary | ICD-10-CM | POA: Diagnosis not present

## 2022-05-17 DIAGNOSIS — I083 Combined rheumatic disorders of mitral, aortic and tricuspid valves: Secondary | ICD-10-CM | POA: Diagnosis not present

## 2022-05-17 DIAGNOSIS — Z7902 Long term (current) use of antithrombotics/antiplatelets: Secondary | ICD-10-CM | POA: Diagnosis not present

## 2022-05-17 DIAGNOSIS — K219 Gastro-esophageal reflux disease without esophagitis: Secondary | ICD-10-CM | POA: Diagnosis not present

## 2022-05-17 DIAGNOSIS — Z6831 Body mass index (BMI) 31.0-31.9, adult: Secondary | ICD-10-CM | POA: Diagnosis not present

## 2022-05-17 DIAGNOSIS — I130 Hypertensive heart and chronic kidney disease with heart failure and stage 1 through stage 4 chronic kidney disease, or unspecified chronic kidney disease: Secondary | ICD-10-CM | POA: Diagnosis not present

## 2022-05-17 DIAGNOSIS — I5032 Chronic diastolic (congestive) heart failure: Secondary | ICD-10-CM | POA: Diagnosis not present

## 2022-05-17 DIAGNOSIS — M47812 Spondylosis without myelopathy or radiculopathy, cervical region: Secondary | ICD-10-CM | POA: Diagnosis not present

## 2022-05-17 DIAGNOSIS — E785 Hyperlipidemia, unspecified: Secondary | ICD-10-CM | POA: Diagnosis not present

## 2022-05-17 DIAGNOSIS — B965 Pseudomonas (aeruginosa) (mallei) (pseudomallei) as the cause of diseases classified elsewhere: Secondary | ICD-10-CM | POA: Diagnosis not present

## 2022-05-18 DIAGNOSIS — I130 Hypertensive heart and chronic kidney disease with heart failure and stage 1 through stage 4 chronic kidney disease, or unspecified chronic kidney disease: Secondary | ICD-10-CM | POA: Diagnosis not present

## 2022-05-18 DIAGNOSIS — I739 Peripheral vascular disease, unspecified: Secondary | ICD-10-CM | POA: Diagnosis not present

## 2022-05-18 DIAGNOSIS — B965 Pseudomonas (aeruginosa) (mallei) (pseudomallei) as the cause of diseases classified elsewhere: Secondary | ICD-10-CM | POA: Diagnosis not present

## 2022-05-18 DIAGNOSIS — I5032 Chronic diastolic (congestive) heart failure: Secondary | ICD-10-CM | POA: Diagnosis not present

## 2022-05-18 DIAGNOSIS — T8149XA Infection following a procedure, other surgical site, initial encounter: Secondary | ICD-10-CM | POA: Diagnosis not present

## 2022-05-18 DIAGNOSIS — I2510 Atherosclerotic heart disease of native coronary artery without angina pectoris: Secondary | ICD-10-CM | POA: Diagnosis not present

## 2022-05-21 ENCOUNTER — Telehealth: Payer: Self-pay

## 2022-05-21 DIAGNOSIS — I739 Peripheral vascular disease, unspecified: Secondary | ICD-10-CM | POA: Diagnosis not present

## 2022-05-21 DIAGNOSIS — I2510 Atherosclerotic heart disease of native coronary artery without angina pectoris: Secondary | ICD-10-CM | POA: Diagnosis not present

## 2022-05-21 DIAGNOSIS — T8149XA Infection following a procedure, other surgical site, initial encounter: Secondary | ICD-10-CM | POA: Diagnosis not present

## 2022-05-21 DIAGNOSIS — B965 Pseudomonas (aeruginosa) (mallei) (pseudomallei) as the cause of diseases classified elsewhere: Secondary | ICD-10-CM | POA: Diagnosis not present

## 2022-05-21 DIAGNOSIS — I5032 Chronic diastolic (congestive) heart failure: Secondary | ICD-10-CM | POA: Diagnosis not present

## 2022-05-21 DIAGNOSIS — I130 Hypertensive heart and chronic kidney disease with heart failure and stage 1 through stage 4 chronic kidney disease, or unspecified chronic kidney disease: Secondary | ICD-10-CM | POA: Diagnosis not present

## 2022-05-21 NOTE — Telephone Encounter (Signed)
Lori RN with [facility] called requesting orders for wound care to be increased back to twice weekly.  Reviewed pt's chart, returned call, two identifiers used. Cecille Rubin explained that the pt refused to increase to twice weekly. Confirmed understanding.

## 2022-05-23 ENCOUNTER — Encounter (INDEPENDENT_AMBULATORY_CARE_PROVIDER_SITE_OTHER): Payer: Self-pay | Admitting: Nurse Practitioner

## 2022-05-23 NOTE — Progress Notes (Signed)
Subjective:    Patient ID: Sabrina [name], female    DOB: 1941/06/14, 81 y.o.   MRN: [id number] Chief Complaint  Patient presents with   Follow-up    [facility] follow up    Sabrina [name] is a 81 year old female who presents today for staple removal from her left groin.  The patient originally underwent a bilateral femoral endarterectomy on 03/17/2022.  She had numerous complications.  Including a cardiac event following surgery.  Upon discharge it was found that the patient had an infected patch of her right groin and underwent right groin debridement and exploration.  This was done by our general partners in [place].  Since that time they have been caring for her right groin wound.  She presents today for staple removal from the left groin that have been in place since her original surgery.    Review of Systems  Skin:  Positive for wound.  All other systems reviewed and are negative.      Objective:   Physical Exam Vitals reviewed.  HENT:     Head: Normocephalic.  Cardiovascular:     Rate and Rhythm: Normal rate.  Pulmonary:     Effort: Pulmonary effort is normal.  Skin:    General: Skin is warm and dry.  Neurological:     Mental Status: She is alert and oriented to person, place, and time.  Psychiatric:        Mood and Affect: Mood normal.        Behavior: Behavior normal.        Thought Content: Thought content normal.        Judgment: Judgment normal.     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 b.) TTE 11/16/2021: EF >55%; mild AS (MPG 9 mmHg)   CAD (coronary artery disease)    a.) s/p 3v CABG 03/29/2000   Carotid artery stenosis    a.) s/p LEFT CEA 09/09/2003. b.) Carotid doppler 51/76/1607: 3-71% LICA, CTO RICA; subclavian stenosis   Cataracts, bilateral    Cervical spondylosis without myelopathy    Chronic diastolic CHF (congestive heart failure), NYHA class 3 (HCC)    a.) TTE 05/27/2016: EF >55%, mild LA enlargement, triv PR, mild MR, mod TR; G3DD. b.) TTE 12/12/2017: EF >55%, mild LVH, BAE, mild MR/PR, mod TR; RVSP 52.8 mmHg. c.) TTE 03/18/2020: EF >55%, BAD, AS (MPG 8.7 mmHg); triv MR, mild TR/PR. d.) TTE 11/16/2021: EF >55%, LVH, G1DD, triv MR, mild PR, mod TR; AS (MPG 9 mmHg); MS (MPG 5 mmHg)   Chronic kidney disease, stage III (moderate) (HCC)    Chronic narcotic use 06/24/2014   Chronic pain syndrome    GERD (gastroesophageal reflux disease)    History of 2019 novel coronavirus disease (COVID-19) 09/22/2021   Hyperlipidemia    Hypertension    Long term current use of antithrombotics/antiplatelets    a.) on daily DAPT therapy (  ASA + clopidogrel)   Lumbar stenosis with neurogenic claudication    Mitral stenosis 11/16/2021   a.) TTE 11/16/2021: EF >55%; mod MS (MPG 5 mmHg)   Osteoarthritis of hip    Pulmonary hypertension ([place]) 12/12/2017   a.) TTE 12/12/2017: mild; RVSP 52.8 mmHg   PVD (peripheral vascular disease) (HCC)    Renal artery stenosis (HCC)    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    Social History   Socioeconomic History   Marital status: Divorced    Spouse name: Not on file   Number of children: 3   Years of education: Not on file   Highest education level: Not on file  Occupational  History   Occupation: retired  Tobacco Use   Smoking status: Former    Types: Cigarettes    Quit date: 10/25/1998    Years since quitting: 23.5   Smokeless tobacco: Never  Vaping Use   Vaping Use: Never used  Substance and Sexual Activity   Alcohol use: No    Alcohol/week: 0.0 standard drinks of alcohol   Drug use: Never   Sexual activity: Not Currently  Other Topics Concern   Not on file  Social History Narrative   Daughter [name] ([place]); 1 in [employer]; 1 in [place] alone   Social Determinants of Health   Financial Resource Strain: [place]  (09/22/2017)   Overall Financial Resource Strain (CARDIA)    Difficulty of Paying Living Expenses: Not hard at all  Food Insecurity: No Food Insecurity (09/22/2017)   Hunger Vital Sign    Worried About Running Out of Food in the Last Year: Never true    [place] in the Last Year: Never true  Transportation Needs: No Transportation Needs (09/22/2017)   PRAPARE - Hydrologist (Medical): No    Lack of Transportation (Non-Medical): No  Physical Activity: Inactive (09/22/2017)   Exercise Vital Sign    Days of Exercise per Week: 0 days    Minutes of Exercise per Session: 0 min  Stress: Unknown (09/22/2017)   [place]    Feeling of Stress : Patient refused  Social Connections: Unknown (09/22/2017)   Social Connection and Isolation Panel [NHANES]    Frequency of Communication with Friends and Family: Patient refused    Frequency of Social Gatherings with Friends and Family: Patient refused    Attends Religious Services: Patient refused    Active Member of Clubs or Organizations: Patient refused    Attends Archivist Meetings: Patient refused    Marital Status: Patient refused  Intimate Partner Violence: Not At Risk (09/22/2017)   Humiliation, Afraid, Rape, and Kick questionnaire    Fear of Current or Ex-Partner: No     Emotionally Abused: No    Physically Abused: No    Sexually Abused: No    Past Surgical History:  Procedure Laterality Date   ABDOMINAL HYSTERECTOMY  1976   CAROTID ARTERY ANGIOPLASTY Left    CAROTID ENDARTERECTOMY Left 09/09/2003   Procedure: CAROTID ENDARTERECTOMY; Location: [facility]; Surgeon: Maura Crandall, MD   COLONOSCOPY WITH PROPOFOL N/A 08/16/2017   Procedure: COLONOSCOPY WITH PROPOFOL;  Surgeon: [name], MD;  Location:  [facility];  Service: Endoscopy;  Laterality: N/A;   CORONARY ANGIOPLASTY WITH STENT PLACEMENT  2000   CORONARY ARTERY BYPASS GRAFT N/A 03/29/2000   Procedure: 3v CORONARY ARTERY BYPASS GRAFT; Location: [facility]   CYSTOSCOPY WITH STENT PLACEMENT Bilateral    ENDARTERECTOMY FEMORAL Bilateral 03/17/2022   Procedure: ENDARTERECTOMY FEMORAL ( BILATERAL SFA STENT);  Surgeon: Katha Cabal, MD;  Location: [facility];  Service: Vascular;  Laterality: Bilateral;   ENDARTERECTOMY FEMORAL Right 03/30/2022   Procedure: RE-EXPOSURE OF RIGHT COMMON FEMORAL ARTERY, RE-DO RIGHT FEMORAL ENDARTERECTOMY WITH VEIN PATCH;  Surgeon: Broadus John, MD;  Location: [facility];  Service: Vascular;  Laterality: Right;  WOUND VAC   ESOPHAGOGASTRODUODENOSCOPY (EGD) WITH PROPOFOL N/A 08/16/2017   Procedure: ESOPHAGOGASTRODUODENOSCOPY (EGD) WITH PROPOFOL;  Surgeon: [name], MD;  Location: [facility];  Service: Endoscopy;  Laterality: N/A;   ESOPHAGOGASTRODUODENOSCOPY (EGD) WITH PROPOFOL N/A 06/29/2018   Procedure: ESOPHAGOGASTRODUODENOSCOPY (EGD) WITH PROPOFOL;  Surgeon: Virgel Manifold, MD;  Location: [facility];  Service: Endoscopy;  Laterality: N/A;   GROIN DEBRIDEMENT Right 03/30/2022   Procedure: [place];  Surgeon: Broadus John, MD;  Location: [facility];  Service: Vascular;  Laterality: Right;   INSERTION OF ILIAC STENT Left 03/17/2022   Procedure: INSERTION OF ILIAC STENT;  Surgeon: Katha Cabal, MD;  Location: [facility];  Service:  Vascular;  Laterality: Left;   LAPAROSCOPIC CHOLECYSTECTOMY Left 10/26/1999   Procedure: LAPAROSCOPIC CHOLECYSTECTOMY; Location: [facility]; Surgeon: Rochel Brome, MD   LEFT HEART CATH AND CORONARY ANGIOGRAPHY N/A 11/18/2021   Procedure: LEFT HEART CATH AND CORONARY ANGIOGRAPHY;  Surgeon: Yolonda Kida, MD;  Location: [facility];  Service: Cardiovascular;  Laterality: N/A;   LEFT HEART CATH AND CORS/GRAFTS ANGIOGRAPHY Left 11/19/2002   Procedure: LEFT HEART CATH AND CORS/GRAFTS ANGIOGRAPHY; Location: [facility]; Surgeon: Katrine Coho, MD   LEFT HEART CATH AND CORS/GRAFTS ANGIOGRAPHY Left 09/17/2003   Procedure: LEFT HEART CATH AND CORS/GRAFTS ANGIOGRAPHY; Location: [facility]; Surgeon: Katrine Coho, MD   LOWER EXTREMITY ANGIOGRAPHY Right 01/06/2022   Procedure: Lower Extremity Angiography;  Surgeon: Katha Cabal, MD;  Location: [facility];  Service: Cardiovascular;  Laterality: Right;   RENAL ARTERY ANGIOPLASTY Bilateral 12/2013   TOE AMPUTATION Right    small toe   TONSILLECTOMY AND ADENOIDECTOMY     TOTAL HIP ARTHROPLASTY Left 2005   TOTAL HIP ARTHROPLASTY Right 2015   UPPER EXTREMITY ANGIOGRAPHY Left 11/23/2021   Procedure: UPPER EXTREMITY ANGIOGRAPHY;  Surgeon: Algernon Huxley, MD;  Location: [facility];  Service: Cardiovascular;  Laterality: Left;    Family History  Problem Relation Age of Onset   Stroke Mother    Hypertension Mother    Diabetes Mother    Hypertension Father    Heart disease Sister        MI   Multiple sclerosis Daughter    Multiple sclerosis Son    Cerebral aneurysm Son    Seizures Son    Cerebral aneurysm Son    Breast cancer Paternal Aunt 32    Allergies  Allergen Reactions   Citalopram Anaphylaxis    Throat closing    Dilaudid [Hydromorphone Hcl] Nausea And Vomiting   Hydrochlorothiazide Other (See Comments)    Decreased GFR (Nov 2015)   Liothyronine     Hair fell out, caused headaches    Nsaids     CKD stage III -  avoid nephrotoxic drugs   Nubain [Nalbuphine Hcl]     Burning sensation in  back   Penicillins Itching   Prasugrel Itching   Statins Itching       Latest Ref Rng & Units 04/14/2022    2:52 AM 04/12/2022    4:07 AM 04/06/2022    5:08 AM  CBC  WBC 4.0 - 10.5 K/uL 6.7  6.8  8.7   Hemoglobin 12.0 - 15.0 g/dL 8.5  8.4  8.9   Hematocrit 36.0 - 46.0 % 26.7  25.5  27.2   Platelets 150 - 400 K/uL 308  325  381       CMP     Component Value Date/Time   NA 137 04/14/2022 0252   NA 139 09/13/2014 0000   NA 134 (L) 04/12/2014 0558   K 3.6 04/14/2022 0252   K 4.4 04/12/2014 0558   CL 103 04/14/2022 0252   CL 101 04/12/2014 0558   CO2 26 04/14/2022 0252   CO2 27 04/12/2014 0558   GLUCOSE 102 (H) 04/14/2022 0252   GLUCOSE 90 04/12/2014 0558   BUN 15 04/14/2022 0252   BUN 18 09/13/2014 0000   BUN 16 04/12/2014 0558   CREATININE 1.48 (H) 04/14/2022 0252   CREATININE 1.12 04/12/2014 0558   CALCIUM 9.0 04/14/2022 0252   CALCIUM 8.5 04/12/2014 0558   PROT 5.7 (L) 04/06/2022 0508   PROT 8.2 01/30/2012 0118   ALBUMIN 2.3 (L) 04/06/2022 0508   ALBUMIN 3.9 01/30/2012 0118   AST 23 04/06/2022 0508   AST 31 01/30/2012 0118   ALT 16 04/06/2022 0508   ALT 22 01/30/2012 0118   ALKPHOS 74 04/06/2022 0508   ALKPHOS 79 01/30/2012 0118   BILITOT 1.0 04/06/2022 0508   BILITOT 0.5 01/30/2012 0118   GFRNONAA 36 (L) 04/14/2022 0252   GFRNONAA 49 (L) 04/12/2014 0558   GFRAA 50 (L) 03/10/2020 1311   GFRAA 57 (L) 04/12/2014 0558     No results found.     Assessment & Plan:   1. Peripheral arterial disease with history of revascularization ([place]) Staples removed from left groin without issue.  Patient will continue to follow with her counterparts in [place] for her wound healing of the right groin.  Following we will resume care.  Patient advised to contact us once wound healing is complete and we can set up her follow-up in approximately 3 months.   Current Outpatient Medications on File  Prior to Visit  Medication Sig Dispense Refill   acetaminophen (TYLENOL) 325 MG tablet Take 1-2 tablets (325-650 mg total) by mouth every 4 (four) hours as needed for mild pain (or temp >/= 101 F).     amLODipine (NORVASC) 10 MG tablet Take 1 tablet (10 mg total) by mouth at bedtime. 90 tablet 1   aspirin 81 MG tablet Take 81 mg by mouth daily.     ciprofloxacin (CIPRO) 500 MG tablet 1 tab daily through 05/10/2022. 30 tablet 2   clopidogrel (PLAVIX) 75 MG tablet Take 1 tablet (75 mg total) by mouth at bedtime. To prevent strokes 90 tablet 1   ezetimibe (ZETIA) 10 MG tablet Take 1 tablet (10 mg total) by mouth at bedtime. 90 tablet 1   ferrous sulfate 325 (65 FE) MG tablet Take 1 tablet (325 mg total) by mouth daily with breakfast. 30 tablet 3   labetalol (NORMODYNE) 300 MG tablet Take 1 tablet (300 mg total) by mouth 2 (two) times daily. For hypertension 180 tablet 1   lisinopril (ZESTRIL) 5 MG tablet Take 1 tablet (5 mg total) by mouth daily. [place]  tablet 1   nortriptyline (PAMELOR) 10 MG capsule Take 1 capsule (10 mg total) by mouth 2 (two) times daily. For chronic headaches 180 capsule 1   oxyCODONE-acetaminophen (PERCOCET/ROXICET) 5-325 MG tablet Take 1-2 tablets by mouth every 4 (four) hours as needed for moderate pain. 30 tablet 0   pantoprazole (PROTONIX) 40 MG tablet One tablet upon waking,  take 30 minuts prior to eating 90 tablet 1   ranolazine (RANEXA) 500 MG 12 hr tablet Take 1 tablet (500 mg total) by mouth 2 (two) times daily. 60 tablet 3   No current facility-administered medications on file prior to visit.    There are no Patient Instructions on file for this visit. No follow-ups on file.   Kris Hartmann, NP

## 2022-05-25 DIAGNOSIS — I130 Hypertensive heart and chronic kidney disease with heart failure and stage 1 through stage 4 chronic kidney disease, or unspecified chronic kidney disease: Secondary | ICD-10-CM | POA: Diagnosis not present

## 2022-05-25 DIAGNOSIS — I739 Peripheral vascular disease, unspecified: Secondary | ICD-10-CM | POA: Diagnosis not present

## 2022-05-25 DIAGNOSIS — T8149XA Infection following a procedure, other surgical site, initial encounter: Secondary | ICD-10-CM | POA: Diagnosis not present

## 2022-05-25 DIAGNOSIS — I5032 Chronic diastolic (congestive) heart failure: Secondary | ICD-10-CM | POA: Diagnosis not present

## 2022-05-25 DIAGNOSIS — B965 Pseudomonas (aeruginosa) (mallei) (pseudomallei) as the cause of diseases classified elsewhere: Secondary | ICD-10-CM | POA: Diagnosis not present

## 2022-05-25 DIAGNOSIS — I2510 Atherosclerotic heart disease of native coronary artery without angina pectoris: Secondary | ICD-10-CM | POA: Diagnosis not present

## 2022-06-01 ENCOUNTER — Telehealth: Payer: Self-pay | Admitting: Internal Medicine

## 2022-06-01 DIAGNOSIS — B965 Pseudomonas (aeruginosa) (mallei) (pseudomallei) as the cause of diseases classified elsewhere: Secondary | ICD-10-CM | POA: Diagnosis not present

## 2022-06-01 DIAGNOSIS — I2510 Atherosclerotic heart disease of native coronary artery without angina pectoris: Secondary | ICD-10-CM | POA: Diagnosis not present

## 2022-06-01 DIAGNOSIS — I130 Hypertensive heart and chronic kidney disease with heart failure and stage 1 through stage 4 chronic kidney disease, or unspecified chronic kidney disease: Secondary | ICD-10-CM

## 2022-06-01 DIAGNOSIS — I739 Peripheral vascular disease, unspecified: Secondary | ICD-10-CM | POA: Diagnosis not present

## 2022-06-01 DIAGNOSIS — I10 Essential (primary) hypertension: Secondary | ICD-10-CM

## 2022-06-01 DIAGNOSIS — I5032 Chronic diastolic (congestive) heart failure: Secondary | ICD-10-CM | POA: Diagnosis not present

## 2022-06-01 DIAGNOSIS — K219 Gastro-esophageal reflux disease without esophagitis: Secondary | ICD-10-CM

## 2022-06-01 DIAGNOSIS — T8149XA Infection following a procedure, other surgical site, initial encounter: Secondary | ICD-10-CM | POA: Diagnosis not present

## 2022-06-01 MED ORDER — PANTOPRAZOLE SODIUM 40 MG PO TBEC: DELAYED_RELEASE_TABLET | ORAL | 3 refills | Status: DC

## 2022-06-01 MED ORDER — AMLODIPINE BESYLATE 10 MG PO TABS: 10.0000 mg | ORAL_TABLET | Freq: Every day | ORAL | 1 refills | Status: DC

## 2022-06-01 NOTE — Telephone Encounter (Signed)
Patient called requesting refill for: pantoprazole (PROTONIX) 40 MG tablet amLODipine (NORVASC) 10 MG tablet

## 2022-06-01 NOTE — Telephone Encounter (Signed)
Medications have been refilled ?

## 2022-06-03 NOTE — Progress Notes (Signed)
Office Note    HPI: Sabrina [name] is a 81 y.o. (11/01/1940) female presenting in follow up s/p right groin exploration, redo CFA patch plasty with ipsilateral GSV.  On exam today, Sabrina [name] was doing well, accompanied by friend.  She denied claudication, rest pain, ischemic tissue loss.  She is living independently at home with home nursing visiting twice a week.  She denies drainage and thinks the right groin wound has healed, but continues to dress it.   Since her last appointment, she has been seen by [facility] and had staples removed from her left groin.   The pt is  on a daily aspirin.   Other AC:  plavix The pt is  on medication for hypertension.   The pt is not diabetic.  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 b.) TTE 11/16/2021: EF >55%; mild AS (MPG 9 mmHg)   CAD (coronary artery disease)    a.) s/p 3v CABG 03/29/2000   Carotid artery stenosis    a.) s/p LEFT CEA 09/09/2003. b.) Carotid doppler 24/23/5361: 4-43% LICA, CTO RICA; subclavian stenosis   Cataracts, bilateral    Cervical spondylosis without myelopathy    Chronic diastolic CHF (congestive heart failure), NYHA class 3 (HCC)    a.) TTE 05/27/2016: EF >55%, mild LA enlargement, triv PR, mild MR, mod TR; G3DD. b.) TTE 12/12/2017: EF >55%, mild LVH, BAE, mild MR/PR, mod TR; RVSP 52.8 mmHg. c.) TTE 03/18/2020: EF >55%, BAD, AS (MPG 8.7 mmHg); triv MR, mild TR/PR. d.) TTE 11/16/2021: EF >55%, LVH, G1DD, triv MR, mild PR, mod TR; AS (MPG 9 mmHg); MS (MPG 5 mmHg)   Chronic kidney disease, stage  III (moderate) (HCC)    Chronic narcotic use 06/24/2014   Chronic pain syndrome    GERD (gastroesophageal reflux disease)    History of 2019 novel coronavirus disease (COVID-19) 09/22/2021   Hyperlipidemia    Hypertension    Long term current use of antithrombotics/antiplatelets    a.) on daily DAPT therapy (ASA + clopidogrel)   Lumbar stenosis with neurogenic claudication    Mitral stenosis 11/16/2021   a.) TTE 11/16/2021: EF >55%; mod MS (MPG 5 mmHg)   Osteoarthritis of hip    Pulmonary hypertension ([place]) 12/12/2017   a.) TTE 12/12/2017: mild; RVSP 52.8 mmHg   PVD (peripheral vascular disease) (HCC)    Renal artery stenosis (HCC)    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    Past Surgical History:  Procedure Laterality Date   ABDOMINAL HYSTERECTOMY  1976   CAROTID ARTERY ANGIOPLASTY Left    CAROTID ENDARTERECTOMY Left 09/09/2003   Procedure: CAROTID ENDARTERECTOMY; Location: [facility]; Surgeon: Maura Crandall, MD   COLONOSCOPY WITH PROPOFOL N/A 08/16/2017   Procedure: COLONOSCOPY WITH PROPOFOL;  Surgeon: [name], MD;  Location: [facility];  Service:  Endoscopy;  Laterality: N/A;   CORONARY ANGIOPLASTY WITH STENT PLACEMENT  2000   CORONARY ARTERY BYPASS GRAFT N/A 03/29/2000   Procedure: 3v CORONARY ARTERY BYPASS GRAFT; Location: [facility]   CYSTOSCOPY WITH STENT PLACEMENT Bilateral    ENDARTERECTOMY FEMORAL Bilateral 03/17/2022   Procedure: ENDARTERECTOMY FEMORAL ( BILATERAL SFA STENT);  Surgeon: Katha Cabal, MD;  Location: [facility];  Service: Vascular;  Laterality: Bilateral;   ENDARTERECTOMY FEMORAL Right 03/30/2022   Procedure: RE-EXPOSURE OF RIGHT COMMON FEMORAL ARTERY, RE-DO RIGHT FEMORAL ENDARTERECTOMY WITH VEIN PATCH;  Surgeon: Broadus John, MD;   Location: [facility] OR;  Service: Vascular;  Laterality: Right;  WOUND VAC   ESOPHAGOGASTRODUODENOSCOPY (EGD) WITH PROPOFOL N/A 08/16/2017   Procedure: ESOPHAGOGASTRODUODENOSCOPY (EGD) WITH PROPOFOL;  Surgeon: [name], MD;  Location: [facility];  Service: Endoscopy;  Laterality: N/A;   ESOPHAGOGASTRODUODENOSCOPY (EGD) WITH PROPOFOL N/A 06/29/2018   Procedure: ESOPHAGOGASTRODUODENOSCOPY (EGD) WITH PROPOFOL;  Surgeon: Virgel Manifold, MD;  Location: [facility];  Service: Endoscopy;  Laterality: N/A;   GROIN DEBRIDEMENT Right 03/30/2022   Procedure: [place];  Surgeon: Broadus John, MD;  Location: [place] [facility];  Service: Vascular;  Laterality: Right;   INSERTION OF ILIAC STENT Left 03/17/2022   Procedure: INSERTION OF ILIAC STENT;  Surgeon: Katha Cabal, MD;  Location: [facility];  Service: Vascular;  Laterality: Left;   LAPAROSCOPIC CHOLECYSTECTOMY Left 10/26/1999   Procedure: LAPAROSCOPIC CHOLECYSTECTOMY; Location: [facility]; Surgeon: Rochel Brome, MD   LEFT HEART CATH AND CORONARY ANGIOGRAPHY N/A 11/18/2021   Procedure: LEFT HEART CATH AND CORONARY ANGIOGRAPHY;  Surgeon: Yolonda Kida, MD;  Location: [facility];  Service: Cardiovascular;  Laterality: N/A;   LEFT HEART CATH AND CORS/GRAFTS ANGIOGRAPHY Left 11/19/2002   Procedure: LEFT HEART CATH AND CORS/GRAFTS ANGIOGRAPHY; Location: [facility]; Surgeon: Katrine Coho, MD   LEFT HEART CATH AND CORS/GRAFTS ANGIOGRAPHY Left 09/17/2003   Procedure: LEFT HEART CATH AND CORS/GRAFTS ANGIOGRAPHY; Location: [facility]; Surgeon: Katrine Coho, MD   LOWER EXTREMITY ANGIOGRAPHY Right 01/06/2022   Procedure: Lower Extremity Angiography;  Surgeon: Katha Cabal, MD;  Location: [facility];  Service: Cardiovascular;  Laterality: Right;   RENAL ARTERY ANGIOPLASTY Bilateral 12/2013   TOE AMPUTATION Right    small toe   TONSILLECTOMY AND ADENOIDECTOMY     TOTAL HIP ARTHROPLASTY Left 2005   TOTAL HIP  ARTHROPLASTY Right 2015   UPPER EXTREMITY ANGIOGRAPHY Left 11/23/2021   Procedure: UPPER EXTREMITY ANGIOGRAPHY;  Surgeon: Algernon Huxley, MD;  Location: [facility];  Service: Cardiovascular;  Laterality: Left;    Social History   Socioeconomic History   Marital status: Divorced    Spouse name: Not on file   Number of children: 3   Years of education: Not on file   Highest education level: Not on file  Occupational History   Occupation: retired  Tobacco Use   Smoking status: Former    Types: Cigarettes    Quit date: 10/25/1998    Years since quitting: 23.6   Smokeless tobacco: Never  Vaping Use   Vaping Use: Never used  Substance and Sexual Activity   Alcohol use: No    Alcohol/week: 0.0 standard drinks of alcohol   Drug use: Never   Sexual activity: Not Currently  Other Topics Concern   Not on file  Social History Narrative   Daughter [name] ([place]); 1 in [employer]; 1 in [place] alone   Social Determinants of Health   Financial Resource Strain:  Low Risk  (09/22/2017)   Overall Financial Resource Strain (CARDIA)    Difficulty of Paying Living Expenses: Not hard at all  Food Insecurity: No Food Insecurity (09/22/2017)   Hunger Vital Sign    Worried About Running Out of Food in the Last Year: Never true    Ran Out of Food in the Last Year: Never true  Transportation Needs: No Transportation Needs (09/22/2017)   PRAPARE - Hydrologist (Medical): No    Lack of Transportation (Non-Medical): No  Physical Activity: Inactive (09/22/2017)   Exercise Vital Sign    Days of Exercise per Week: 0 days    Minutes of Exercise per Session: 0 min  Stress: Unknown (09/22/2017)   [place]    Feeling of Stress : Patient refused  Social Connections: Unknown (09/22/2017)   Social Connection and Isolation Panel [NHANES]    Frequency of Communication with Friends and Family:  Patient refused    Frequency of Social Gatherings with Friends and Family: Patient refused    Attends Religious Services: Patient refused    Active Member of Clubs or Organizations: Patient refused    Attends Archivist Meetings: Patient refused    Marital Status: Patient refused  Intimate Partner Violence: Not At Risk (09/22/2017)   Humiliation, Afraid, Rape, and Kick questionnaire    Fear of Current or Ex-Partner: No    Emotionally Abused: No    Physically Abused: No    Sexually Abused: No    Family History  Problem Relation Age of Onset   Stroke Mother    Hypertension Mother    Diabetes Mother    Hypertension Father    Heart disease Sister        MI   Multiple sclerosis Daughter    Multiple sclerosis Son    Cerebral aneurysm Son    Seizures Son    Cerebral aneurysm Son    Breast cancer Paternal Aunt 14    Current Outpatient Medications  Medication Sig Dispense Refill   acetaminophen (TYLENOL) 325 MG tablet Take 1-2 tablets (325-650 mg total) by mouth every 4 (four) hours as needed for mild pain (or temp >/= 101 F).     amLODipine (NORVASC) 10 MG tablet Take 1 tablet (10 mg total) by mouth at bedtime. 90 tablet 1   aspirin 81 MG tablet Take 81 mg by mouth daily.     ciprofloxacin (CIPRO) 500 MG tablet 1 tab daily through 05/10/2022. 30 tablet 2   clopidogrel (PLAVIX) 75 MG tablet Take 1 tablet (75 mg total) by mouth at bedtime. To prevent strokes 90 tablet 1   ezetimibe (ZETIA) 10 MG tablet Take 1 tablet (10 mg total) by mouth at bedtime. 90 tablet 1   ferrous sulfate 325 (65 FE) MG tablet Take 1 tablet (325 mg total) by mouth daily with breakfast. 30 tablet 3   labetalol (NORMODYNE) 300 MG tablet Take 1 tablet (300 mg total) by mouth 2 (two) times daily. For hypertension 180 tablet 1   lisinopril (ZESTRIL) 5 MG tablet Take 1 tablet (5 mg total) by mouth daily. 90 tablet 1   nortriptyline (PAMELOR) 10 MG capsule Take 1 capsule (10 mg total) by mouth 2 (two) times  daily. For chronic headaches 180 capsule 1   pantoprazole (PROTONIX) 40 MG tablet One tablet upon waking,  take 30 minuts prior to eating 90 tablet 3   ranolazine (RANEXA) 500 MG 12 hr tablet Take  1 tablet (500 mg total) by mouth 2 (two) times daily. 60 tablet 3   oxyCODONE-acetaminophen (PERCOCET/ROXICET) 5-325 MG tablet Take 1-2 tablets by mouth every 4 (four) hours as needed for moderate pain. (Patient not taking: Reported on 06/04/2022) 30 tablet 0   No current facility-administered medications for this visit.    Allergies  Allergen Reactions   Citalopram Anaphylaxis    Throat closing    Dilaudid [Hydromorphone Hcl] Nausea And Vomiting   Hydrochlorothiazide Other (See Comments)    Decreased GFR (Nov 2015)   Liothyronine     Hair fell out, caused headaches    Nsaids     CKD stage III - avoid nephrotoxic drugs   Nubain [Nalbuphine Hcl]     Burning sensation in back   Penicillins Itching   Prasugrel Itching   Statins Itching     REVIEW OF SYSTEMS:   [X]  denotes positive finding, [ ]  denotes negative finding Cardiac  Comments:  Chest pain or chest pressure:    Shortness of breath upon exertion:    Short of breath when lying flat:    Irregular heart rhythm:        Vascular    Pain in calf, thigh, or hip brought on by ambulation:    Pain in feet at night that wakes you up from your sleep:     Blood clot in your veins:    Leg swelling:         Pulmonary    Oxygen at home:    Productive cough:     Wheezing:         Neurologic    Sudden weakness in arms or legs:     Sudden numbness in arms or legs:     Sudden onset of difficulty speaking or slurred speech:    Temporary loss of vision in one eye:     Problems with dizziness:         Gastrointestinal    Blood in stool:     Vomited blood:         Genitourinary    Burning when urinating:     Blood in urine:        Psychiatric    Major depression:         Hematologic    Bleeding problems:    Problems with blood  clotting too easily:        Skin    Rashes or ulcers:        Constitutional    Fever or chills:      PHYSICAL EXAMINATION:  Vitals:   06/04/22 1202  BP: (!) 174/71  Pulse: 63  Resp: 20  Temp: 98.2 F (36.8 C)  SpO2: 98%  Weight: 154 lb (69.9 kg)  Height: 5' (1.524 m)    General:  WDWN in NAD; vital signs documented above Gait: Not observed HENT: WNL, normocephalic Pulmonary: normal non-labored breathing , without wheezing Cardiac: regular HR, Abdomen: soft, NT, no masses Skin: without rashes Vascular Exam/Pulses:  Right Left  Radial 2+ (normal) 2+ (normal)  Ulnar 2+ (normal) 2+ (normal)  Femoral    Popliteal    DP nonpalpable nonpalpable  PT nonpalpable nonpalpable   Extremities: without ischemic changes, without Gangrene , without cellulitis; without open wounds;  Right groin wound with 3 small areas opened, probe less than 1 cm, Musculoskeletal: no muscle wasting or atrophy  Neurologic: A&O X 3;  No focal weakness or paresthesias are detected Psychiatric:  The pt has Normal affect.  Non-Invasive Vascular Imaging:   non    ASSESSMENT/PLAN: Phoua A Mclear is a 81 y.o. female presenting in follow up s/p right groin exploration, redo CFA patch plasty with ipsilateral GSV.    Her right groin has healed nicely. I am happy she has fully recovered. Janin asked to continue coming to [facility] for her vascular care.  I asked that she discuss this further with [facility] as continuity of care is important when it comes to vascular disease.  I am happy to see her in follow-up should she choose to do so.  My plan would be to see her in 3 months with aortobiiliac imaging, ABI. She will also need her carotid disease followed if she has an occluded right ICA.  I asked that she continue her current medication regimen.    Broadus John, MD [facility] [phone]

## 2022-06-04 ENCOUNTER — Other Ambulatory Visit: Payer: Self-pay

## 2022-06-04 ENCOUNTER — Ambulatory Visit (INDEPENDENT_AMBULATORY_CARE_PROVIDER_SITE_OTHER): Payer: Medicare Other | Admitting: Vascular Surgery

## 2022-06-04 ENCOUNTER — Encounter: Payer: Self-pay | Admitting: Vascular Surgery

## 2022-06-04 VITALS — BP 174/71 | HR 63 | Temp 98.2°F | Resp 20 | Ht 60.0 in | Wt 154.0 lb

## 2022-06-04 DIAGNOSIS — Z9889 Other specified postprocedural states: Secondary | ICD-10-CM

## 2022-06-04 DIAGNOSIS — I6523 Occlusion and stenosis of bilateral carotid arteries: Secondary | ICD-10-CM

## 2022-06-04 DIAGNOSIS — I739 Peripheral vascular disease, unspecified: Secondary | ICD-10-CM

## 2022-06-08 DIAGNOSIS — T8149XA Infection following a procedure, other surgical site, initial encounter: Secondary | ICD-10-CM | POA: Diagnosis not present

## 2022-06-08 DIAGNOSIS — I2510 Atherosclerotic heart disease of native coronary artery without angina pectoris: Secondary | ICD-10-CM | POA: Diagnosis not present

## 2022-06-08 DIAGNOSIS — I5032 Chronic diastolic (congestive) heart failure: Secondary | ICD-10-CM | POA: Diagnosis not present

## 2022-06-08 DIAGNOSIS — I130 Hypertensive heart and chronic kidney disease with heart failure and stage 1 through stage 4 chronic kidney disease, or unspecified chronic kidney disease: Secondary | ICD-10-CM | POA: Diagnosis not present

## 2022-06-08 DIAGNOSIS — I739 Peripheral vascular disease, unspecified: Secondary | ICD-10-CM | POA: Diagnosis not present

## 2022-06-08 DIAGNOSIS — B965 Pseudomonas (aeruginosa) (mallei) (pseudomallei) as the cause of diseases classified elsewhere: Secondary | ICD-10-CM | POA: Diagnosis not present

## 2022-06-28 ENCOUNTER — Encounter: Payer: Self-pay | Admitting: Internal Medicine

## 2022-06-29 ENCOUNTER — Other Ambulatory Visit: Payer: Self-pay

## 2022-06-29 DIAGNOSIS — I10 Essential (primary) hypertension: Secondary | ICD-10-CM

## 2022-06-29 IMAGING — MR MR HEAD W/O CM
12 series · 46 of 48 positions shown · non-contrast
Comparison: Head CT June 12, 2021.

MRI of the brain June 02, 2012.

CLINICAL DATA: Other complicated headache syndrome
([id number]). Headache, chronic, new features or increased frequency.

EXAM:
MRI HEAD WITHOUT CONTRAST
TECHNIQUE: Multiplanar, multiecho pulse sequences of the brain and surrounding
structures were obtained without intravenous contrast.

[Series 5: ax dwi_tracew · axial · 3.0mm · 0.65mm/px · z∈[-102,+52]mm · 3 of 48 slices shown]
[im 1/48]
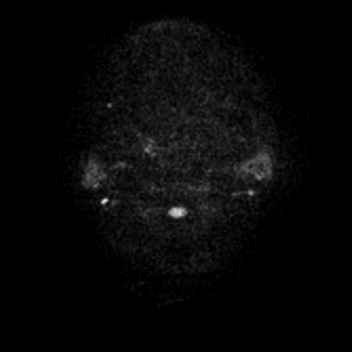
[im 24/48]
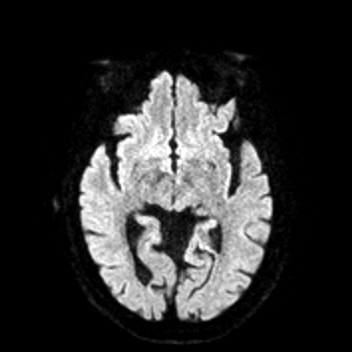
[im 48/48]
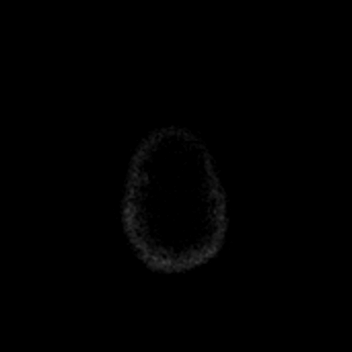

[Series 6: ax dwi_adc · axial · 3.0mm · 0.65mm/px · z∈[-102,+52]mm · 4 of 48 slices shown]
[im 1/48]
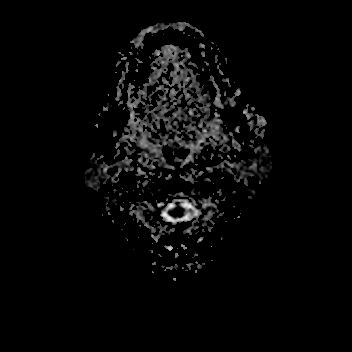
[im 16/48]
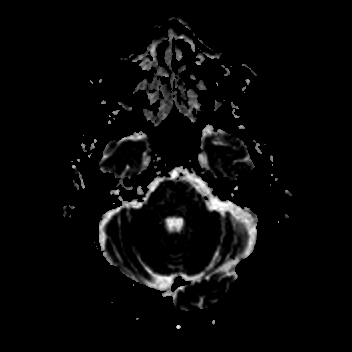
[im 32/48]
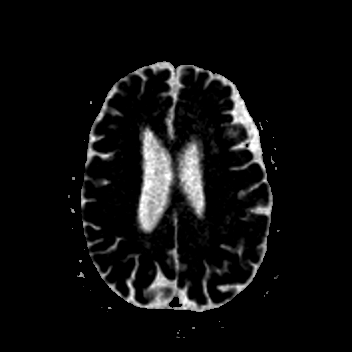
[im 48/48]
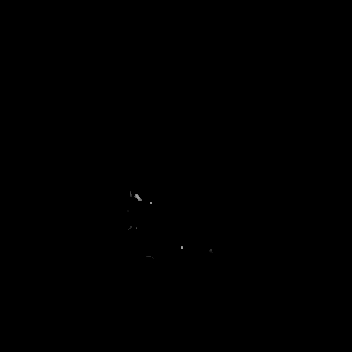

[Series 7: cor dwi_tracew · coronal · 5.0mm · 0.65mm/px · 3 of 38 slices shown]
[im 1/38]
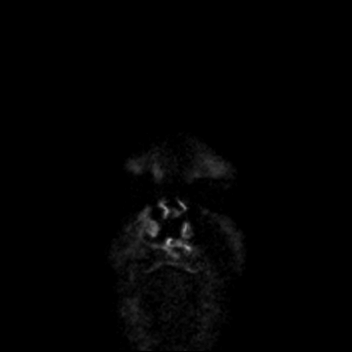
[im 19/38]
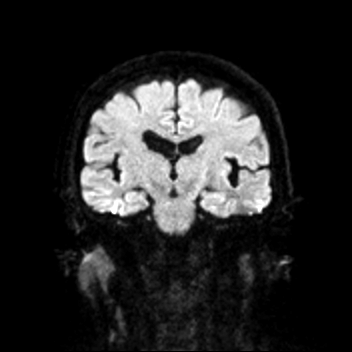
[im 38/38]
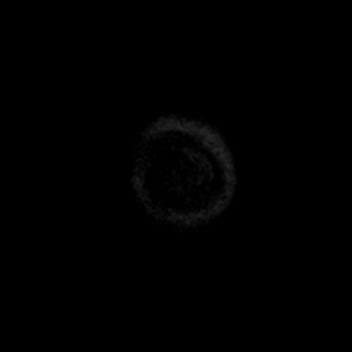

[Series 8: cor dwi_adc · coronal · 5.0mm · 0.65mm/px · 3 of 38 slices shown]
[im 1/38]
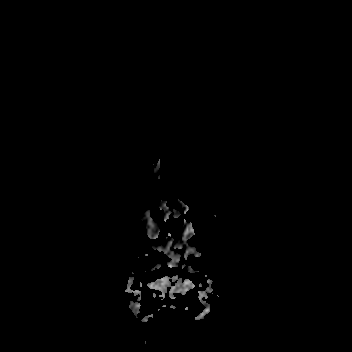
[im 19/38]
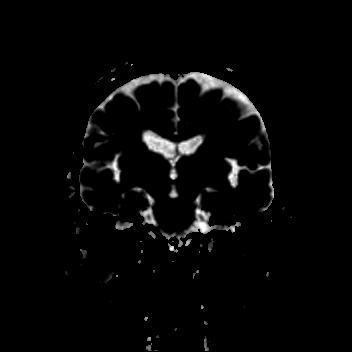
[im 38/38]
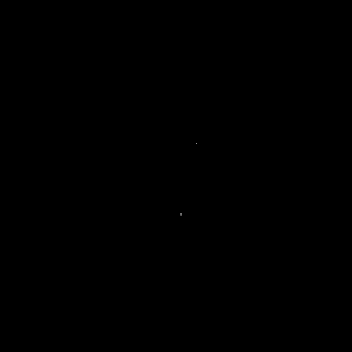

[Series 9: T1 · sagittal · 5.0mm · 0.62mm/px · 2 of 22 slices shown (1 of 2)]
[im 1/22]
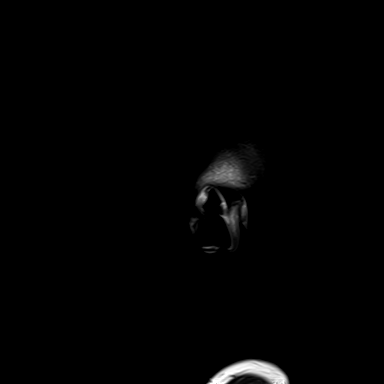
[im 22/22]
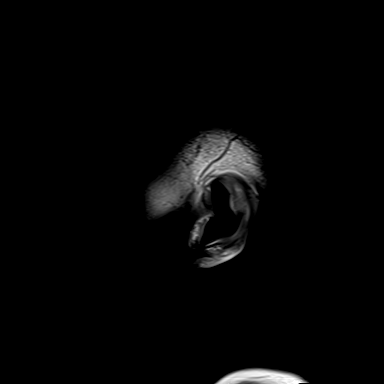

[Series 10: T2 · axial · 5.0mm · 0.53mm/px · z∈[-96,+47]mm · 2 of 25 slices shown (1 of 2)]
[im 1/25]
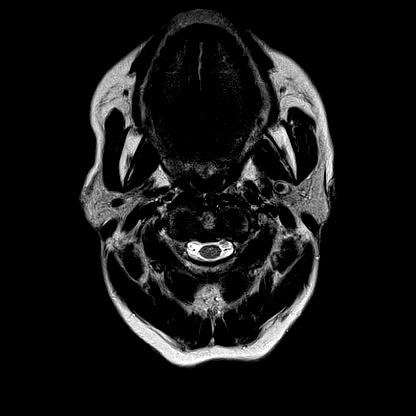
[im 25/25]
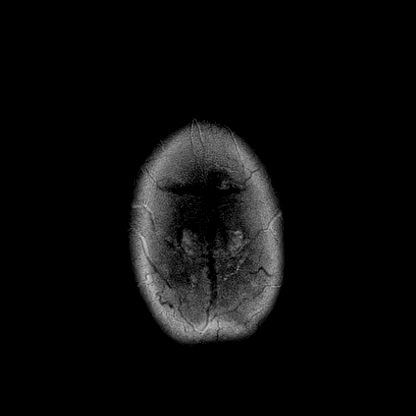

[Series 11: mag_images · axial · 3.0mm · 0.90mm/px · z∈[-113,+62]mm · 4 of 60 slices shown]
[im 1/60]
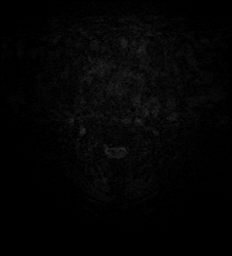
[im 20/60]
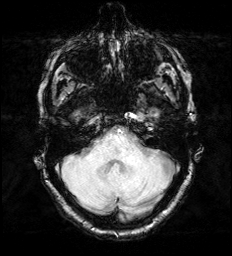
[im 40/60]
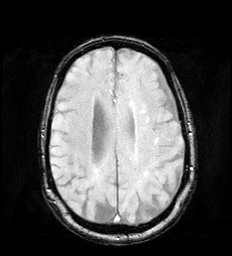
[im 60/60]
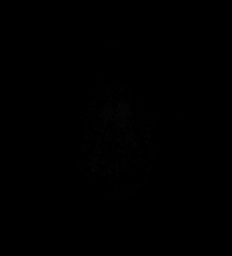

[Series 12: pha_images · axial · 3.0mm · 0.90mm/px · z∈[-110,+62]mm · 4 of 59 slices shown]
[im 1/59]
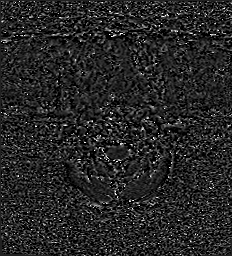
[im 20/59]
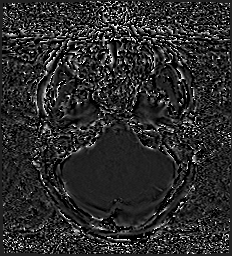
[im 39/59]
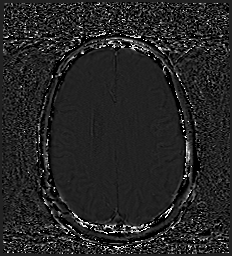
[im 59/59]
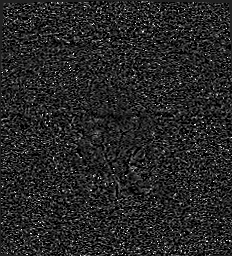

[Series 13: swi_images · axial · 3.0mm · 0.90mm/px · z∈[-113,+62]mm · 4 of 60 slices shown]
[im 1/60]
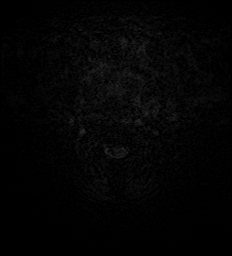
[im 20/60]
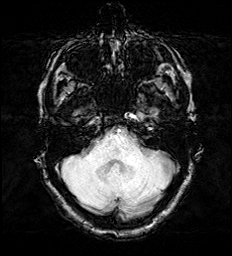
[im 40/60]
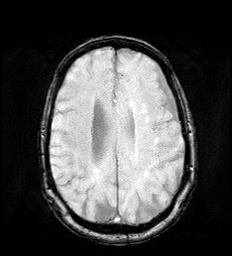
[im 60/60]
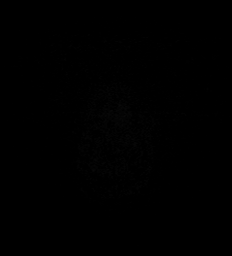

[Series 15: FLAIR · axial · 3.0mm · 0.53mm/px · z∈[-105,+56]mm · 4 of 55 slices shown]
[im 1/55]
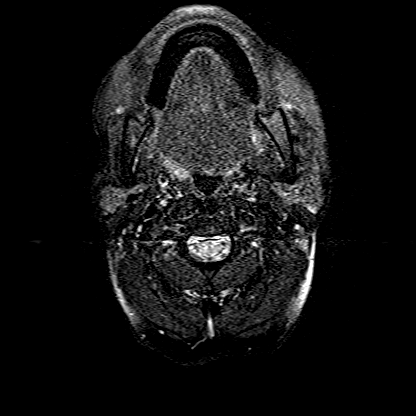
[im 19/55]
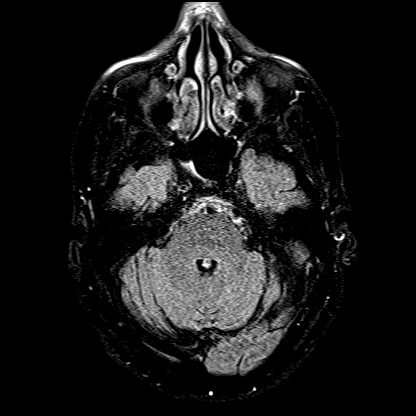
[im 37/55]
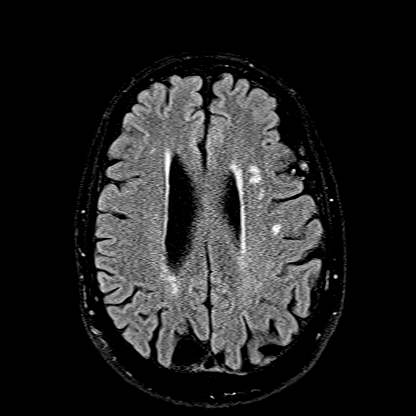
[im 55/55]
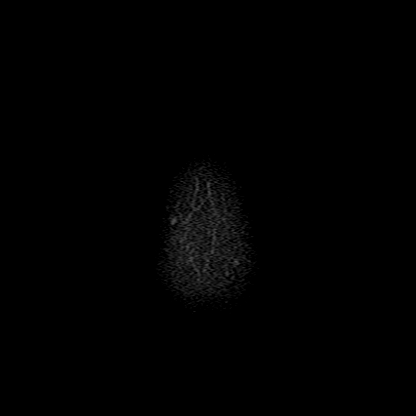

[Series 16: T1 · axial · 1.0mm · 0.98mm/px · z∈[-113,+60]mm · 11 of 176 slices shown (2 of 2)]
[im 1/176]
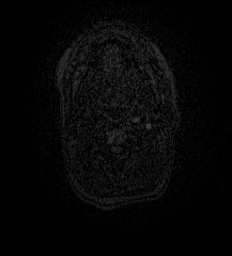
[im 15/176]
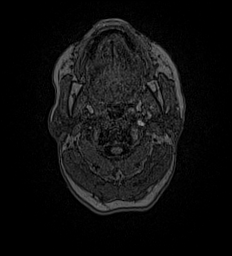
[im 30/176]
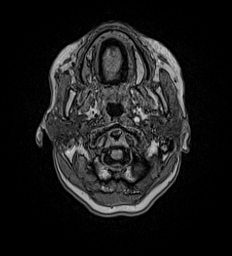
[im 44/176]
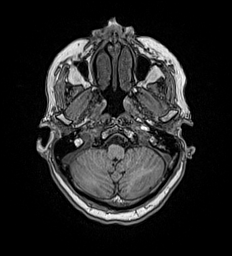
[im 59/176]
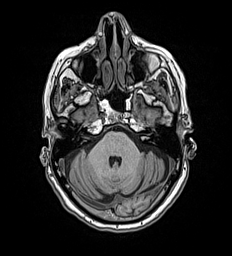
[im 73/176]
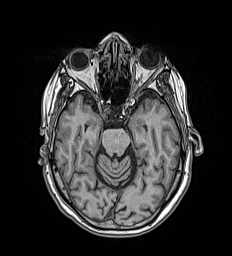
[im 88/176]
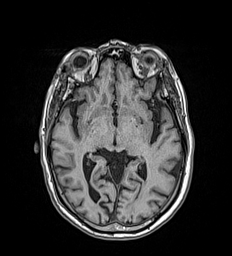
[im 103/176]
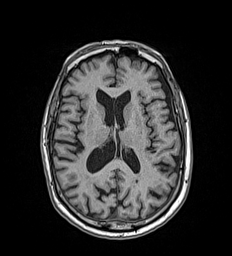
[im 117/176]
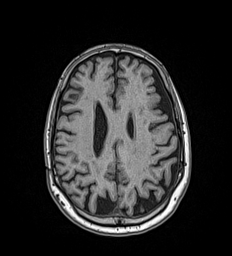
[im 146/176]
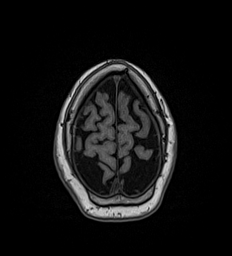
[im 176/176]
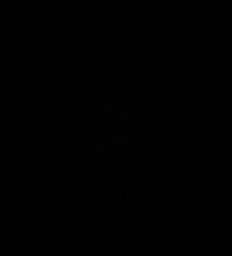

[Series 17: T2 · coronal · 5.0mm · 0.57mm/px · 2 of 29 slices shown (2 of 2)]
[im 1/29]
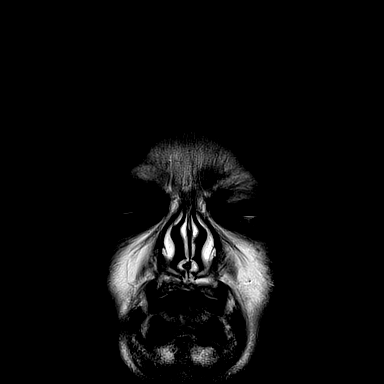
[im 29/29]
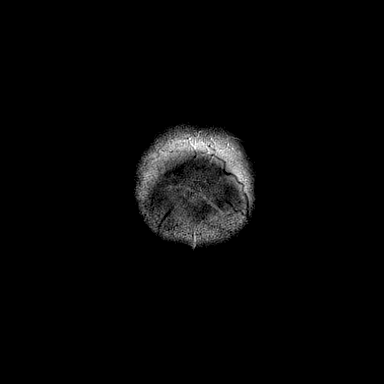

[46 of 48 positions shown; findings below may reference images not displayed]

FINDINGS: Brain: No acute infarction, hemorrhage, hydrocephalus, extra-axial
collection or mass lesion. Small amount of scattered foci of T2
hyperintensity are seen within the white matter of the cerebral
hemispheres, nonspecific, most likely related to chronic small
vessel ischemia. Remote small cortical infarct in the left occipital
lobe. Moderate parenchymal volume loss, more pronounced in the
bilateral parietal are regions.

Vascular: Known occlusion of the visualized upper cervical right
internal carotid artery with the diminutive caliber at the distal
cavernous and supraclinoid segment. A 3.7 mm outpouching from the
supraclinoid left ICA may represent an aneurysm (series 10, image
11). Remainder of the major intracranial vessels have normal flow
void.

Skull and upper cervical spine: Normal marrow signal.

Sinuses/Orbits: Bilateral lens surgery. Paranasal sinuses are
essentially clear.

Other: None.
IMPRESSION: 1. No acute intracranial abnormality.
2. Mild chronic microvascular ischemic changes of the white matter.
3. Small remote cortical infarct in the left occipital lobe.
4. Moderate parenchymal volume loss with parietal predominance.
5. Suspected left ICA posterior communicating segment 3.7 mm
aneurysm. Correlation with MR angiogram suggested.
6. Chronic occlusion of the cervical right ICA.

## 2022-06-29 MED ORDER — LABETALOL HCL 300 MG PO TABS: 300.0000 mg | ORAL_TABLET | Freq: Two times a day (BID) | ORAL | 1 refills | Status: DC

## 2022-07-06 DIAGNOSIS — I208 Other forms of angina pectoris: Secondary | ICD-10-CM | POA: Diagnosis not present

## 2022-07-06 DIAGNOSIS — I350 Nonrheumatic aortic (valve) stenosis: Secondary | ICD-10-CM | POA: Diagnosis not present

## 2022-07-06 DIAGNOSIS — E782 Mixed hyperlipidemia: Secondary | ICD-10-CM | POA: Diagnosis not present

## 2022-07-06 DIAGNOSIS — N1832 Chronic kidney disease, stage 3b: Secondary | ICD-10-CM | POA: Diagnosis not present

## 2022-07-06 DIAGNOSIS — I5032 Chronic diastolic (congestive) heart failure: Secondary | ICD-10-CM | POA: Diagnosis not present

## 2022-07-06 DIAGNOSIS — I739 Peripheral vascular disease, unspecified: Secondary | ICD-10-CM | POA: Diagnosis not present

## 2022-07-06 DIAGNOSIS — I050 Rheumatic mitral stenosis: Secondary | ICD-10-CM | POA: Diagnosis not present

## 2022-07-06 DIAGNOSIS — Z951 Presence of aortocoronary bypass graft: Secondary | ICD-10-CM | POA: Diagnosis not present

## 2022-07-06 DIAGNOSIS — J449 Chronic obstructive pulmonary disease, unspecified: Secondary | ICD-10-CM | POA: Diagnosis not present

## 2022-07-06 DIAGNOSIS — K219 Gastro-esophageal reflux disease without esophagitis: Secondary | ICD-10-CM | POA: Diagnosis not present

## 2022-07-06 DIAGNOSIS — I2510 Atherosclerotic heart disease of native coronary artery without angina pectoris: Secondary | ICD-10-CM | POA: Diagnosis not present

## 2022-07-06 DIAGNOSIS — I10 Essential (primary) hypertension: Secondary | ICD-10-CM | POA: Diagnosis not present

## 2022-07-12 ENCOUNTER — Other Ambulatory Visit: Payer: Self-pay

## 2022-07-12 ENCOUNTER — Inpatient Hospital Stay: Payer: Medicare Other | Attending: Oncology

## 2022-07-12 DIAGNOSIS — D631 Anemia in chronic kidney disease: Secondary | ICD-10-CM

## 2022-07-12 DIAGNOSIS — N183 Chronic kidney disease, stage 3 unspecified: Secondary | ICD-10-CM | POA: Insufficient documentation

## 2022-07-13 ENCOUNTER — Inpatient Hospital Stay: Payer: Medicare Other

## 2022-07-13 DIAGNOSIS — N184 Chronic kidney disease, stage 4 (severe): Secondary | ICD-10-CM

## 2022-07-15 ENCOUNTER — Telehealth: Payer: Self-pay | Admitting: Internal Medicine

## 2022-07-15 ENCOUNTER — Inpatient Hospital Stay: Payer: Medicare Other | Admitting: Oncology

## 2022-07-15 NOTE — Telephone Encounter (Signed)
Patient called and wanted to know if Dr Derrel Nip would sent to pharmacy the same mediation she had last time for , sore throat, cough all day and night, sneezing. Having a hard time sleeping with cough. Patient did not want an appointment.

## 2022-07-15 NOTE — Telephone Encounter (Signed)
Not sure what the medication was called.

## 2022-07-16 ENCOUNTER — Encounter: Payer: Self-pay | Admitting: Internal Medicine

## 2022-07-16 ENCOUNTER — Other Ambulatory Visit: Payer: Self-pay

## 2022-07-16 ENCOUNTER — Ambulatory Visit (INDEPENDENT_AMBULATORY_CARE_PROVIDER_SITE_OTHER): Payer: Medicare Other | Admitting: Internal Medicine

## 2022-07-16 ENCOUNTER — Other Ambulatory Visit: Payer: Medicare Other

## 2022-07-16 VITALS — BP 136/68 | Temp 100.8°F | Wt 154.0 lb

## 2022-07-16 DIAGNOSIS — D631 Anemia in chronic kidney disease: Secondary | ICD-10-CM

## 2022-07-16 DIAGNOSIS — R051 Acute cough: Secondary | ICD-10-CM

## 2022-07-16 MED ORDER — NORTRIPTYLINE HCL 10 MG PO CAPS: 10.0000 mg | ORAL_CAPSULE | Freq: Two times a day (BID) | ORAL | 3 refills | Status: DC

## 2022-07-16 MED ORDER — HYDROCOD POLI-CHLORPHE POLI ER 10-8 MG/5ML PO SUER: 5.0000 mL | Freq: Every evening | ORAL | 0 refills | Status: DC | PRN

## 2022-07-16 NOTE — Progress Notes (Signed)
Telephone  I connected with Sabrina [name]  on 07/16/22 at  9:20 AM EDT by telephone  and verified that I am speaking with the correct person using two identifiers.  Location patient: [place] Location provider:work or home office Persons participating in the virtual visit: patient, provider  I discussed the limitations and requested verbal permission for telemedicine visit. The patient expressed understanding and agreed to proceed.   HPI:  Acute telemedicine visit for : Cough x 1 week and fever 100.2 not wearing mask in public will get tested today for covid, flu, rsv tried otc cough syrups  She wants Rx tussionex has worked before  She is coughing w/o wheezing so much when cough chest hurts and h/a No prior cough with ACEI per pt  Needs refill of nortriptyline asked for ranexa but Dr. Clayborn Bigness rx this -Pertinent past medical history: see below -Pertinent medication allergies: Allergies  Allergen Reactions   Citalopram Anaphylaxis    Throat closing    Dilaudid [Hydromorphone Hcl] Nausea And Vomiting   Hydrochlorothiazide Other (See Comments)    Decreased GFR (Nov 2015)   Liothyronine     Hair fell out, caused headaches    Nsaids     CKD stage III - avoid nephrotoxic drugs   Nubain [Nalbuphine Hcl]     Burning sensation in back   Penicillins Itching   Prasugrel Itching   Statins Itching   -COVID-19 vaccine status:  Immunization History  Administered Date(s) Administered   Fluad Quad(high Dose 65+) 07/31/2019, 07/24/2020, 07/06/2021   Influenza, High Dose Seasonal PF 08/14/2018   Influenza,inj,Quad PF,6+ Mos 06/24/2014, 08/26/2016, 09/22/2017   Moderna Sars-Covid-2 Vaccination 09/24/2020   PFIZER(Purple Top)SARS-COV-2 Vaccination 12/22/2019, 01/12/2020   Pneumococcal Conjugate-13 12/27/2013   Tdap 01/10/2014     ROS: See pertinent positives and negatives per HPI.  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 b.) TTE 11/16/2021: EF >55%; mild AS (MPG 9 mmHg)   CAD (coronary artery disease)    a.) s/p 3v CABG 03/29/2000   Carotid artery stenosis    a.) s/p LEFT CEA 09/09/2003. b.) Carotid doppler 16/07/9603: 5-40% LICA, CTO RICA; subclavian stenosis   Cataracts, bilateral    Cervical spondylosis without myelopathy    Chronic diastolic CHF (congestive heart failure), NYHA class 3 (HCC)    a.) TTE 05/27/2016: EF >55%, mild LA enlargement, triv PR, mild MR, mod TR; G3DD. b.) TTE 12/12/2017: EF >55%, mild LVH, BAE, mild MR/PR, mod TR; RVSP 52.8 mmHg. c.) TTE 03/18/2020: EF >55%, BAD, AS (MPG 8.7 mmHg); triv MR, mild TR/PR. d.) TTE 11/16/2021: EF >55%, LVH, G1DD, triv MR, mild PR, mod TR; AS (MPG 9 mmHg); MS (MPG 5 mmHg)   Chronic kidney disease, stage III (moderate) (HCC)    Chronic narcotic use 06/24/2014   Chronic pain syndrome    GERD (gastroesophageal reflux disease)    History of 2019 novel coronavirus disease (COVID-19) 09/22/2021   Hyperlipidemia    Hypertension    Long term current use of antithrombotics/antiplatelets    a.) on daily DAPT therapy (ASA + clopidogrel)   Lumbar stenosis  with neurogenic claudication    Mitral stenosis 11/16/2021   a.) TTE 11/16/2021: EF >55%; mod MS (MPG 5 mmHg)   Osteoarthritis of hip    Pulmonary hypertension ([place]) 12/12/2017   a.) TTE 12/12/2017: mild; RVSP 52.8 mmHg   PVD (peripheral vascular disease) (HCC)    Renal artery stenosis (HCC)    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    Past Surgical History:  Procedure Laterality Date   ABDOMINAL HYSTERECTOMY  1976   CAROTID ARTERY ANGIOPLASTY Left    CAROTID ENDARTERECTOMY Left 09/09/2003   Procedure: CAROTID ENDARTERECTOMY; Location: [facility]; Surgeon: Maura Crandall, MD   COLONOSCOPY WITH PROPOFOL N/A 08/16/2017   Procedure: COLONOSCOPY WITH PROPOFOL;  Surgeon: [name], MD;  Location: [facility];  Service: Endoscopy;  Laterality: N/A;   CORONARY ANGIOPLASTY WITH STENT PLACEMENT  2000   CORONARY ARTERY BYPASS GRAFT N/A 03/29/2000   Procedure: 3v CORONARY ARTERY BYPASS GRAFT; Location: [facility]   CYSTOSCOPY WITH STENT PLACEMENT Bilateral    ENDARTERECTOMY FEMORAL Bilateral 03/17/2022   Procedure: ENDARTERECTOMY FEMORAL ( BILATERAL SFA STENT);  Surgeon: Katha Cabal, MD;  Location: [facility];  Service: Vascular;  Laterality: Bilateral;   ENDARTERECTOMY FEMORAL Right 03/30/2022   Procedure: RE-EXPOSURE OF RIGHT COMMON FEMORAL ARTERY, RE-DO RIGHT FEMORAL ENDARTERECTOMY WITH VEIN PATCH;  Surgeon: Broadus John, MD;  Location: [facility] OR;  Service: Vascular;  Laterality: Right;  WOUND VAC   ESOPHAGOGASTRODUODENOSCOPY (EGD) WITH PROPOFOL N/A 08/16/2017   Procedure: ESOPHAGOGASTRODUODENOSCOPY (EGD) WITH PROPOFOL;  Surgeon: [name], MD;  Location: [facility];  Service: Endoscopy;  Laterality: N/A;   ESOPHAGOGASTRODUODENOSCOPY (EGD) WITH PROPOFOL N/A 06/29/2018   Procedure: ESOPHAGOGASTRODUODENOSCOPY (EGD) WITH PROPOFOL;  Surgeon: Virgel Manifold, MD;  Location: [facility];  Service: Endoscopy;  Laterality: N/A;   GROIN DEBRIDEMENT Right 03/30/2022   Procedure: [place];  Surgeon: Broadus John, MD;  Location: [place];  Service: Vascular;  Laterality: Right;   INSERTION OF ILIAC STENT Left 03/17/2022   Procedure: INSERTION OF ILIAC STENT;  Surgeon: Katha Cabal, MD;  Location: [facility];   Service: Vascular;  Laterality: Left;   LAPAROSCOPIC CHOLECYSTECTOMY Left 10/26/1999   Procedure: LAPAROSCOPIC CHOLECYSTECTOMY; Location: [facility]; Surgeon: Rochel Brome, MD   LEFT HEART CATH AND CORONARY ANGIOGRAPHY N/A 11/18/2021   Procedure: LEFT HEART CATH AND CORONARY ANGIOGRAPHY;  Surgeon: Yolonda Kida, MD;  Location: [facility];  Service: Cardiovascular;  Laterality: N/A;   LEFT HEART CATH AND CORS/GRAFTS ANGIOGRAPHY Left 11/19/2002   Procedure: LEFT HEART CATH AND CORS/GRAFTS ANGIOGRAPHY; Location: [place]; Surgeon: Katrine Coho, MD   LEFT HEART CATH AND CORS/GRAFTS ANGIOGRAPHY Left 09/17/2003   Procedure: LEFT HEART CATH AND CORS/GRAFTS ANGIOGRAPHY; Location: [place]; Surgeon: Katrine Coho, MD   LOWER EXTREMITY ANGIOGRAPHY Right 01/06/2022   Procedure: Lower Extremity Angiography;  Surgeon: Katha Cabal, MD;  Location: [place] [facility];  Service: Cardiovascular;  Laterality: Right;   RENAL ARTERY ANGIOPLASTY Bilateral 12/2013   TOE AMPUTATION Right    small toe   TONSILLECTOMY AND ADENOIDECTOMY     TOTAL HIP ARTHROPLASTY Left 2005   TOTAL HIP ARTHROPLASTY Right 2015   UPPER EXTREMITY ANGIOGRAPHY Left 11/23/2021   Procedure: UPPER EXTREMITY ANGIOGRAPHY;  Surgeon: Algernon Huxley, MD;  Location: [place] [facility];  Service: Cardiovascular;  Laterality: Left;     Current Outpatient Medications:    acetaminophen (TYLENOL) 325 MG tablet, Take 1-2 tablets (325-650 mg total) by mouth every 4 (four) hours as needed for mild pain (or temp >/= 101 F)., Disp: , Rfl:    amLODipine (NORVASC) 10 MG tablet, Take 1 tablet (10 mg total) by mouth at bedtime., Disp: 90 tablet, Rfl: 1   aspirin 81 MG tablet, Take 81 mg by mouth daily., Disp: , Rfl:    chlorpheniramine-HYDROcodone (TUSSIONEX) 10-8 MG/5ML, Take 5 mLs by mouth at bedtime as needed for cough., Disp: 115 mL, Rfl: 0   clopidogrel (PLAVIX) 75 MG tablet, Take 1 tablet (75 mg total) by mouth at bedtime. To prevent  strokes, Disp: 90 tablet, Rfl: 1   ezetimibe (ZETIA) 10 MG tablet, Take 1 tablet (10 mg total) by mouth at bedtime., Disp: 90 tablet, Rfl: 1   ferrous sulfate 325 (65 FE) MG tablet, Take 1 tablet (325 mg total) by mouth daily with breakfast., Disp: 30 tablet, Rfl: 3   labetalol (NORMODYNE) 300 MG tablet, Take 1 tablet (300 mg total) by mouth 2 (two) times daily. For hypertension, Disp: 180 tablet, Rfl: 1   lisinopril (ZESTRIL) 5 MG tablet, Take 1 tablet (5 mg total) by mouth daily., Disp: 90 tablet, Rfl: 1   pantoprazole (PROTONIX) 40 MG tablet, One tablet upon waking,  take 30 minuts prior to eating, Disp: 90 tablet, Rfl: 3   ranolazine (RANEXA) 500 MG 12 hr tablet, Take 1 tablet (500 mg total) by mouth 2 (two) times daily., Disp: 60 tablet, Rfl: 3   ciprofloxacin (CIPRO) 500 MG tablet, 1 tab daily through 05/10/2022. (Patient not taking: Reported on 07/16/2022), Disp: 30 tablet, Rfl: 2   nortriptyline (PAMELOR) 10 MG capsule, Take 1 capsule (10 mg total) by mouth 2 (two) times daily. For chronic headaches, Disp: 180 capsule, Rfl: 3   oxyCODONE-acetaminophen (PERCOCET/ROXICET) 5-325 MG tablet, Take 1-2 tablets by mouth every 4 (four) hours as needed for moderate pain. (Patient not taking: Reported on 06/04/2022), Disp: 30 tablet, Rfl: 0  EXAM:  VITALS per patient if applicable:  GENERAL: alert, oriented, appears well and in no acute distress   LUNGS: on inspection no signs of respiratory distress, breathing rate appears normal, no obvious gross SOB, gasping or wheezing +cough on exam   PSYCH/NEURO: pleasant and cooperative, no obvious depression or anxiety, speech and thought processing grossly intact  ASSESSMENT AND PLAN:  Discussed the following assessment and plan:  Acute cough - Plan: COVID-19, Flu A+B and RSV, nortriptyline (PAMELOR) 10 MG capsule, chlorpheniramine-HYDROcodone (TUSSIONEX) 10-8 MG/5ML  -we discussed possible serious and likely etiologies, options for evaluation and  workup, limitations of telemedicine visit vs in person visit, treatment, treatment risks and precautions. Pt is agreeable to treatment via telemedicine at this moment.   I discussed the assessment and treatment plan with the patient. The patient was provided an opportunity to ask questions and all were answered. The patient agreed with the plan and demonstrated an understanding of the instructions.    Time spent 20 min Delorise Jackson, MD

## 2022-07-16 NOTE — Telephone Encounter (Signed)
Pt is scheduled with Dr. Olivia Mackie this morning.

## 2022-07-16 NOTE — Patient Instructions (Addendum)
If needing prescription strength medication we will need to make an appointment with a provider.  These are over the counter medication options:  Mucinex dm green label for cough or robitussin DM (for cough) -for high Blood pressure Multivitamin or below vitamins  Vitamin C 1000 mg daily.  Vitamin D3 4000 Iu (units) daily.  Zinc 100 mg daily.  Quercetin 250-500 mg 2 times per day   Elderberry  Oil of oregano  cepacol or chloroseptic spray Warm salt water gargles +hydrogen peroxide Sugar free cough drops  Warm tea with honey and lemon  Hydration  Try to eat though you dont feel like it   Tylenol or Advil  Nasal saline and Flonase 2 sprays nasal congestion  If sneezing/runny nose over the counter allergy pill claritin,allegra, zyrtec, xyzal Quarantine x 10-14 days 14 days preferred   Monitor pulse oximeter, buy from [facility] if oxygen is less than 90 please go to the hospital.        Are you feeling really sick? Shortness of breath, cough, chest pain?, dizziness? Confusion   If so let me know  If worsening, go to hospital or [facility] Urgent care for further treatment.

## 2022-07-17 LAB — COVID-19, FLU A+B AND RSV
Influenza A, NAA: NOT DETECTED
Influenza B, NAA: NOT DETECTED
RSV, NAA: NOT DETECTED
SARS-CoV-2, NAA: NOT DETECTED

## 2022-07-19 ENCOUNTER — Telehealth: Payer: Self-pay | Admitting: Internal Medicine

## 2022-07-19 NOTE — Telephone Encounter (Signed)
Pt called stating the delsym is not working but the mucinex is. Pt want the provider to send in more mucinex. Pt would like to be called

## 2022-07-19 NOTE — Telephone Encounter (Signed)
error 

## 2022-07-20 ENCOUNTER — Inpatient Hospital Stay: Payer: Medicare Other

## 2022-07-20 DIAGNOSIS — D631 Anemia in chronic kidney disease: Secondary | ICD-10-CM | POA: Diagnosis not present

## 2022-07-20 DIAGNOSIS — N183 Chronic kidney disease, stage 3 unspecified: Secondary | ICD-10-CM | POA: Diagnosis not present

## 2022-07-20 LAB — CBC WITH DIFFERENTIAL/PLATELET
Abs Immature Granulocytes: 0.02 10*3/uL (ref 0.00–0.07)
Basophils Absolute: 0 10*3/uL (ref 0.0–0.1)
Basophils Relative: 0 %
Eosinophils Absolute: 0.4 10*3/uL (ref 0.0–0.5)
Eosinophils Relative: 7 %
HCT: 34.2 % — ABNORMAL LOW (ref 36.0–46.0)
Hemoglobin: 11.8 g/dL — ABNORMAL LOW (ref 12.0–15.0)
Immature Granulocytes: 0 %
Lymphocytes Relative: 31 %
Lymphs Abs: 1.5 10*3/uL (ref 0.7–4.0)
MCH: 28.9 pg (ref 26.0–34.0)
MCHC: 34.5 g/dL (ref 30.0–36.0)
MCV: 83.8 fL (ref 80.0–100.0)
Monocytes Absolute: 0.5 10*3/uL (ref 0.1–1.0)
Monocytes Relative: 11 %
Neutro Abs: 2.5 10*3/uL (ref 1.7–7.7)
Neutrophils Relative %: 51 %
Platelets: 286 10*3/uL (ref 150–400)
RBC: 4.08 MIL/uL (ref 3.87–5.11)
RDW: 13.1 % (ref 11.5–15.5)
WBC: 5 10*3/uL (ref 4.0–10.5)
nRBC: 0 % (ref 0.0–0.2)

## 2022-07-20 LAB — FERRITIN: Ferritin: 232 ng/mL (ref 11–307)

## 2022-07-20 LAB — IRON AND TIBC
Iron: 109 ug/dL (ref 28–170)
Saturation Ratios: 39 % — ABNORMAL HIGH (ref 10.4–31.8)
TIBC: 279 ug/dL (ref 250–450)
UIBC: 170 ug/dL

## 2022-07-26 ENCOUNTER — Inpatient Hospital Stay: Payer: Medicare Other | Attending: Oncology | Admitting: Oncology

## 2022-07-26 ENCOUNTER — Encounter: Payer: Self-pay | Admitting: Oncology

## 2022-07-26 VITALS — BP 161/67 | HR 62 | Temp 96.7°F | Resp 18 | Wt 158.1 lb

## 2022-07-26 DIAGNOSIS — R5383 Other fatigue: Secondary | ICD-10-CM | POA: Insufficient documentation

## 2022-07-26 DIAGNOSIS — M549 Dorsalgia, unspecified: Secondary | ICD-10-CM | POA: Diagnosis not present

## 2022-07-26 DIAGNOSIS — D631 Anemia in chronic kidney disease: Secondary | ICD-10-CM | POA: Insufficient documentation

## 2022-07-26 DIAGNOSIS — E785 Hyperlipidemia, unspecified: Secondary | ICD-10-CM | POA: Insufficient documentation

## 2022-07-26 DIAGNOSIS — N1832 Chronic kidney disease, stage 3b: Secondary | ICD-10-CM

## 2022-07-26 DIAGNOSIS — Z9049 Acquired absence of other specified parts of digestive tract: Secondary | ICD-10-CM | POA: Diagnosis not present

## 2022-07-26 DIAGNOSIS — Z820 Family history of epilepsy and other diseases of the nervous system: Secondary | ICD-10-CM | POA: Diagnosis not present

## 2022-07-26 DIAGNOSIS — Z8616 Personal history of COVID-19: Secondary | ICD-10-CM | POA: Insufficient documentation

## 2022-07-26 DIAGNOSIS — Z888 Allergy status to other drugs, medicaments and biological substances status: Secondary | ICD-10-CM | POA: Diagnosis not present

## 2022-07-26 DIAGNOSIS — Z885 Allergy status to narcotic agent status: Secondary | ICD-10-CM | POA: Insufficient documentation

## 2022-07-26 DIAGNOSIS — Z7902 Long term (current) use of antithrombotics/antiplatelets: Secondary | ICD-10-CM | POA: Insufficient documentation

## 2022-07-26 DIAGNOSIS — N183 Chronic kidney disease, stage 3 unspecified: Secondary | ICD-10-CM | POA: Insufficient documentation

## 2022-07-26 DIAGNOSIS — Z8249 Family history of ischemic heart disease and other diseases of the circulatory system: Secondary | ICD-10-CM | POA: Insufficient documentation

## 2022-07-26 DIAGNOSIS — Z79899 Other long term (current) drug therapy: Secondary | ICD-10-CM | POA: Diagnosis not present

## 2022-07-26 DIAGNOSIS — I5032 Chronic diastolic (congestive) heart failure: Secondary | ICD-10-CM | POA: Diagnosis not present

## 2022-07-26 DIAGNOSIS — Z880 Allergy status to penicillin: Secondary | ICD-10-CM | POA: Insufficient documentation

## 2022-07-26 DIAGNOSIS — N184 Chronic kidney disease, stage 4 (severe): Secondary | ICD-10-CM

## 2022-07-26 DIAGNOSIS — Z886 Allergy status to analgesic agent status: Secondary | ICD-10-CM | POA: Insufficient documentation

## 2022-07-26 DIAGNOSIS — K219 Gastro-esophageal reflux disease without esophagitis: Secondary | ICD-10-CM | POA: Insufficient documentation

## 2022-07-26 DIAGNOSIS — Z8269 Family history of other diseases of the musculoskeletal system and connective tissue: Secondary | ICD-10-CM | POA: Insufficient documentation

## 2022-07-26 DIAGNOSIS — G894 Chronic pain syndrome: Secondary | ICD-10-CM | POA: Insufficient documentation

## 2022-07-26 DIAGNOSIS — I130 Hypertensive heart and chronic kidney disease with heart failure and stage 1 through stage 4 chronic kidney disease, or unspecified chronic kidney disease: Secondary | ICD-10-CM | POA: Diagnosis not present

## 2022-07-26 DIAGNOSIS — Z833 Family history of diabetes mellitus: Secondary | ICD-10-CM | POA: Insufficient documentation

## 2022-07-26 DIAGNOSIS — Z87891 Personal history of nicotine dependence: Secondary | ICD-10-CM | POA: Diagnosis not present

## 2022-07-26 DIAGNOSIS — I739 Peripheral vascular disease, unspecified: Secondary | ICD-10-CM | POA: Insufficient documentation

## 2022-07-26 DIAGNOSIS — Z803 Family history of malignant neoplasm of breast: Secondary | ICD-10-CM | POA: Insufficient documentation

## 2022-07-26 DIAGNOSIS — Z823 Family history of stroke: Secondary | ICD-10-CM | POA: Diagnosis not present

## 2022-07-26 NOTE — Progress Notes (Signed)
Hematology/Oncology Progress note [phone] [phone]      Patient Care Team: Crecencio Mc, MD as PCP - General (Internal Medicine)  ASSESSMENT & PLAN:   Anemia of chronic kidney failure, stage 3 (moderate) ([place]) Labs reviewed and discussed with patient Hemoglobin 11.8, no need for erythropoietin therapy Iron saturation of 39, ferritin of 232. Recommend patient to decrease ferrous sulfate to every other day.    CKD (chronic kidney disease), stage III (HCC) Avoid nephro toxins.  Encourage oral hydration Encourage patient to follow up with nephrologist.   Orders Placed This Encounter  Procedures   CBC with Differential/Platelet    Standing Status:   Future    Standing Expiration Date:   07/27/2023   Iron and TIBC    Standing Status:   Future    Standing Expiration Date:   07/27/2023   Ferritin    Standing Status:   Future    Standing Expiration Date:   07/27/2023   Follow up  1 year lab prior to MD  All questions were answered. The patient knows to call the clinic with any problems, questions or concerns.  Earlie Server, MD, PhD [facility] Hematology Oncology 07/26/2022   REASON FOR VISIT Follow up for management of anemia   HISTORY OF PRESENTING ILLNESS:  Sabrina [name] is a  81 y.o.  female with PMH listed below who was referred to me for evaluation of anemia.  Patient recently had lab work done which revealed anemia with hemoglobin 11.5.   Reviewed patient's previous labs, her hemoglobin ranges from 10.8 to 11.8 in the past.  Reports feeling fatigue.  Patient denies weight loss, easy bruising, hematochezia, hemoptysis.  Has history of CKD and follows up with nephrologist.  Chronic pain syndrome. On Tramadol and fentanyl patch.      INTERVAL HISTORY Sabrina [name] is a 81 y.o. female who has above history reviewed by me today presents for follow up visit for management of anemia.  She reports feeling well.  She take oral iron  supplementation ferrous sulfate 325mg  daily.  No new complaints.    Review of Systems  Constitutional:  Positive for malaise/fatigue. Negative for chills, fever and weight loss.  HENT:  Negative for sore throat.   Eyes:  Negative for redness.  Respiratory:  Negative for cough, shortness of breath and wheezing.   Cardiovascular:  Negative for chest pain, palpitations and leg swelling.  Gastrointestinal:  Negative for abdominal pain, blood in stool, nausea and vomiting.  Genitourinary:  Negative for dysuria.  Musculoskeletal:  Positive for back pain. Negative for myalgias.  Skin:  Negative for rash.  Neurological:  Negative for dizziness, tingling and tremors.  Endo/Heme/Allergies:  Does not bruise/bleed easily.  Psychiatric/Behavioral:  Negative for hallucinations.     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 b.) TTE 11/16/2021: EF >55%; mild AS (MPG 9 mmHg)   CAD (coronary artery disease)    a.) s/p 3v CABG 03/29/2000   Carotid artery stenosis    a.) s/p LEFT CEA 09/09/2003. b.) Carotid doppler 09/14/2018: 1-39%  LICA, CTO RICA; subclavian stenosis   Cataracts, bilateral    Cervical spondylosis without myelopathy    Chronic diastolic CHF (congestive heart failure), NYHA class 3 (HCC)    a.) TTE 05/27/2016: EF >55%, mild LA enlargement, triv PR, mild MR, mod TR; G3DD. b.) TTE 12/12/2017: EF >55%, mild LVH, BAE, mild MR/PR, mod TR; RVSP 52.8 mmHg. c.) TTE 03/18/2020: EF >55%, BAD, AS (MPG 8.7 mmHg); triv MR, mild TR/PR. d.) TTE 11/16/2021: EF >55%, LVH, G1DD, triv  MR, mild PR, mod TR; AS (MPG 9 mmHg); MS (MPG 5 mmHg)   Chronic kidney disease, stage III (moderate) (HCC)    Chronic narcotic use 06/24/2014   Chronic pain syndrome    GERD (gastroesophageal reflux disease)    History of 2019 novel coronavirus disease (COVID-19) 09/22/2021   Hyperlipidemia    Hypertension    Long term current use of antithrombotics/antiplatelets    a.) on daily DAPT therapy (ASA + clopidogrel)   Lumbar stenosis with neurogenic claudication    Mitral stenosis 11/16/2021   a.) TTE 11/16/2021: EF >55%; mod MS (MPG 5 mmHg)   Osteoarthritis of hip    Pulmonary hypertension ([place]) 12/12/2017   a.) TTE 12/12/2017: mild; RVSP 52.8 mmHg   PVD (peripheral vascular disease) (HCC)    Renal artery stenosis (HCC)    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    SURGICAL HISTORY: Past Surgical History:  Procedure Laterality Date   ABDOMINAL HYSTERECTOMY  1976   CAROTID ARTERY ANGIOPLASTY Left    CAROTID ENDARTERECTOMY Left 09/09/2003   Procedure: CAROTID ENDARTERECTOMY; Location: [facility]; Surgeon: Maura Crandall, MD   COLONOSCOPY WITH PROPOFOL N/A 08/16/2017   Procedure: COLONOSCOPY WITH PROPOFOL;  Surgeon: [name], MD;  Location: [facility];  Service: Endoscopy;  Laterality: N/A;   CORONARY ANGIOPLASTY WITH STENT PLACEMENT  2000   CORONARY ARTERY BYPASS GRAFT N/A 03/29/2000   Procedure: 3v CORONARY ARTERY BYPASS GRAFT; Location: [facility]   CYSTOSCOPY WITH STENT PLACEMENT Bilateral    ENDARTERECTOMY FEMORAL Bilateral 03/17/2022   Procedure: ENDARTERECTOMY FEMORAL ( BILATERAL SFA STENT);  Surgeon: Katha Cabal, MD;  Location: [facility] ORS;  Service: Vascular;  Laterality: Bilateral;   ENDARTERECTOMY FEMORAL Right 03/30/2022   Procedure: RE-EXPOSURE OF RIGHT COMMON  FEMORAL ARTERY, RE-DO RIGHT FEMORAL ENDARTERECTOMY WITH VEIN PATCH;  Surgeon: Broadus John, MD;  Location: [facility] OR;  Service: Vascular;  Laterality: Right;  WOUND VAC   ESOPHAGOGASTRODUODENOSCOPY (EGD) WITH PROPOFOL N/A 08/16/2017   Procedure: ESOPHAGOGASTRODUODENOSCOPY (EGD) WITH PROPOFOL;  Surgeon: [name], MD;  Location: [facility];  Service: Endoscopy;  Laterality: N/A;   ESOPHAGOGASTRODUODENOSCOPY (EGD) WITH PROPOFOL N/A 06/29/2018   Procedure: ESOPHAGOGASTRODUODENOSCOPY (EGD) WITH PROPOFOL;  Surgeon: Virgel Manifold, MD;  Location: [facility];  Service: Endoscopy;  Laterality: N/A;   GROIN DEBRIDEMENT Right 03/30/2022   Procedure: [place];  Surgeon: Broadus John, MD;  Location: [place];  Service: Vascular;  Laterality: Right;   INSERTION OF ILIAC STENT Left 03/17/2022   Procedure: INSERTION OF ILIAC STENT;  Surgeon: Katha Cabal, MD;  Location: [facility];  Service: Vascular;  Laterality: Left;   LAPAROSCOPIC CHOLECYSTECTOMY Left 10/26/1999   Procedure: LAPAROSCOPIC CHOLECYSTECTOMY; Location: [facility]; Surgeon: Rochel Brome, MD   LEFT HEART CATH AND CORONARY  ANGIOGRAPHY N/A 11/18/2021   Procedure: LEFT HEART CATH AND CORONARY ANGIOGRAPHY;  Surgeon: Yolonda Kida, MD;  Location: [place] [facility];  Service: Cardiovascular;  Laterality: N/A;   LEFT HEART CATH AND CORS/GRAFTS ANGIOGRAPHY Left 11/19/2002   Procedure: LEFT HEART CATH AND CORS/GRAFTS ANGIOGRAPHY; Location: [place]; Surgeon: Katrine Coho, MD   LEFT HEART CATH AND CORS/GRAFTS ANGIOGRAPHY Left 09/17/2003   Procedure: LEFT HEART CATH AND CORS/GRAFTS ANGIOGRAPHY; Location: [place]; Surgeon: Katrine Coho, MD   LOWER EXTREMITY ANGIOGRAPHY Right 01/06/2022   Procedure: Lower Extremity Angiography;  Surgeon: Katha Cabal, MD;  Location: [place] [facility];  Service: Cardiovascular;  Laterality: Right;   RENAL ARTERY ANGIOPLASTY Bilateral 12/2013   TOE AMPUTATION Right     small toe   TONSILLECTOMY AND ADENOIDECTOMY     TOTAL HIP ARTHROPLASTY Left 2005   TOTAL HIP ARTHROPLASTY Right 2015   UPPER EXTREMITY ANGIOGRAPHY Left 11/23/2021   Procedure: UPPER EXTREMITY ANGIOGRAPHY;  Surgeon: Algernon Huxley, MD;  Location: [place] [facility];  Service: Cardiovascular;  Laterality: Left;    SOCIAL HISTORY: Social History   Socioeconomic History   Marital status: Divorced    Spouse name: Not on file   Number of children: 3   Years of education: Not on file   Highest education level: Not on file  Occupational History   Occupation: retired  Tobacco Use   Smoking status: Former    Types: Cigarettes    Quit date: 10/25/1998    Years since quitting: 23.7   Smokeless tobacco: Never  Vaping Use   Vaping Use: Never used  Substance and Sexual Activity   Alcohol use: No    Alcohol/week: 0.0 standard drinks of alcohol   Drug use: Never   Sexual activity: Not Currently  Other Topics Concern   Not on file  Social History Narrative   Daughter [name] ([place]); 1 in [employer]; 1 in [place] alone   Social Determinants of Health   Financial Resource Strain: [place]  (09/22/2017)   Overall Financial Resource Strain (CARDIA)    Difficulty of Paying Living Expenses: Not hard at all  Food Insecurity: No Food Insecurity (09/22/2017)   Hunger Vital Sign    Worried About Running Out of Food in the Last Year: Never true    Universal in the Last Year: Never true  Transportation Needs: No Transportation Needs (09/22/2017)   PRAPARE - Hydrologist (Medical): No    Lack of Transportation (Non-Medical): No  Physical Activity: Inactive (09/22/2017)   Exercise Vital Sign    Days of Exercise per Week: 0 days    Minutes of Exercise per Session: 0 min  Stress: Unknown (09/22/2017)   [place]    Feeling of Stress : Patient refused  Social Connections: Unknown  (09/22/2017)   Social Connection and Isolation Panel [NHANES]    Frequency of Communication with Friends and Family: Patient refused    Frequency of Social Gatherings with Friends and Family: Patient refused    Attends Religious Services: Patient refused    Active Member of Clubs or Organizations: Patient refused    Attends Archivist Meetings: Patient refused    Marital Status: Patient refused  Intimate Partner Violence: Not At Risk (09/22/2017)   Humiliation, Afraid, Rape, and Kick questionnaire    Fear of Current or Ex-Partner: No    Emotionally Abused: No    Physically Abused:  No    Sexually Abused: No    FAMILY HISTORY: Family History  Problem Relation Age of Onset   Stroke Mother    Hypertension Mother    Diabetes Mother    Hypertension Father    Heart disease Sister        MI   Multiple sclerosis Daughter    Multiple sclerosis Son    Cerebral aneurysm Son    Seizures Son    Cerebral aneurysm Son    Breast cancer Paternal Aunt 68    ALLERGIES:  is allergic to citalopram, dilaudid [hydromorphone hcl], hydrochlorothiazide, liothyronine, nsaids, nubain [nalbuphine hcl], penicillins, prasugrel, and statins.  MEDICATIONS:  Current Outpatient Medications  Medication Sig Dispense Refill   acetaminophen (TYLENOL) 325 MG tablet Take 1-2 tablets (325-650 mg total) by mouth every 4 (four) hours as needed for mild pain (or temp >/= 101 F).     amLODipine (NORVASC) 10 MG tablet Take 1 tablet (10 mg total) by mouth at bedtime. 90 tablet 1   aspirin 81 MG tablet Take 81 mg by mouth daily.     clopidogrel (PLAVIX) 75 MG tablet Take 1 tablet (75 mg total) by mouth at bedtime. To prevent strokes 90 tablet 1   ezetimibe (ZETIA) 10 MG tablet Take 1 tablet (10 mg total) by mouth at bedtime. 90 tablet 1   ferrous sulfate 325 (65 FE) MG tablet Take 1 tablet (325 mg total) by mouth daily with breakfast. 30 tablet 3   labetalol (NORMODYNE) 300 MG tablet Take 1 tablet (300 mg total)  by mouth 2 (two) times daily. For hypertension 180 tablet 1   lisinopril (ZESTRIL) 5 MG tablet Take 1 tablet (5 mg total) by mouth daily. 90 tablet 1   nortriptyline (PAMELOR) 10 MG capsule Take 1 capsule (10 mg total) by mouth 2 (two) times daily. For chronic headaches 180 capsule 3   pantoprazole (PROTONIX) 40 MG tablet One tablet upon waking,  take 30 minuts prior to eating 90 tablet 3   ranolazine (RANEXA) 500 MG 12 hr tablet Take 1 tablet (500 mg total) by mouth 2 (two) times daily. 60 tablet 3   oxyCODONE-acetaminophen (PERCOCET/ROXICET) 5-325 MG tablet Take 1-2 tablets by mouth every 4 (four) hours as needed for moderate pain. (Patient not taking: Reported on 07/26/2022) 30 tablet 0   No current facility-administered medications for this visit.     PHYSICAL EXAMINATION: ECOG PERFORMANCE STATUS: 1 - Symptomatic but completely ambulatory Vitals:   07/26/22 1318  BP: (!) 161/67  Pulse: 62  Resp: 18  Temp: (!) 96.7 F (35.9 C)   Filed Weights   07/26/22 1318  Weight: 158 lb 1.6 oz (71.7 kg)    Physical Exam Constitutional:      General: She is not in acute distress.    Appearance: She is well-developed.  Eyes:     General: No scleral icterus. Cardiovascular:     Rate and Rhythm: Normal rate and regular rhythm.     Heart sounds: Murmur heard.  Pulmonary:     Effort: Pulmonary effort is normal. No respiratory distress.     Breath sounds: No wheezing.  Abdominal:     General: Bowel sounds are normal. There is no distension.     Palpations: Abdomen is soft.  Musculoskeletal:        General: No deformity. Normal range of motion.     Cervical back: Normal range of motion and neck supple.  Lymphadenopathy:     Cervical: No cervical adenopathy.  Skin:    General: Skin is warm and dry.     Findings: No erythema or rash.  Neurological:     Mental Status: She is alert and oriented to person, place, and time. Mental status is at baseline.     Cranial Nerves: No cranial nerve  deficit.  Psychiatric:        Mood and Affect: Mood normal.      LABORATORY DATA:  I have reviewed the data as listed    Latest Ref Rng & Units 07/20/2022    3:44 PM 04/14/2022    2:52 AM 04/12/2022    4:07 AM  CBC  WBC 4.0 - 10.5 K/uL 5.0  6.7  6.8   Hemoglobin 12.0 - 15.0 g/dL 11.8  8.5  8.4   Hematocrit 36.0 - 46.0 % 34.2  26.7  25.5   Platelets 150 - 400 K/uL 286  308  325       Latest Ref Rng & Units 04/14/2022    2:52 AM 04/12/2022    4:07 AM 04/11/2022    5:02 AM  CMP  Glucose 70 - 99 mg/dL 102  90  93   BUN 8 - 23 mg/dL 15  13  11    Creatinine 0.44 - 1.00 mg/dL 1.48  1.49  1.34   Sodium 135 - 145 mmol/L 137  138  137   Potassium 3.5 - 5.1 mmol/L 3.6  3.8  3.4   Chloride 98 - 111 mmol/L 103  105  103   CO2 22 - 32 mmol/L 26  27  26    Calcium 8.9 - 10.3 mg/dL 9.0  8.9  8.8

## 2022-07-26 NOTE — Assessment & Plan Note (Signed)
Avoid nephro toxins.  Encourage oral hydration Encourage patient to follow up with nephrologist.

## 2022-07-26 NOTE — Assessment & Plan Note (Signed)
Labs reviewed and discussed with patient Hemoglobin 11.8, no need for erythropoietin therapy Iron saturation of 39, ferritin of 232. Recommend patient to decrease ferrous sulfate to every other day.

## 2022-07-28 ENCOUNTER — Encounter: Payer: Self-pay | Admitting: Internal Medicine

## 2022-07-28 ENCOUNTER — Other Ambulatory Visit: Payer: Self-pay

## 2022-07-28 DIAGNOSIS — R051 Acute cough: Secondary | ICD-10-CM

## 2022-07-28 MED ORDER — NORTRIPTYLINE HCL 10 MG PO CAPS: 10.0000 mg | ORAL_CAPSULE | Freq: Two times a day (BID) | ORAL | 3 refills | Status: DC

## 2022-07-30 ENCOUNTER — Ambulatory Visit (INDEPENDENT_AMBULATORY_CARE_PROVIDER_SITE_OTHER): Payer: Medicare Other | Admitting: Internal Medicine

## 2022-07-30 ENCOUNTER — Encounter: Payer: Self-pay | Admitting: Internal Medicine

## 2022-07-30 VITALS — BP 190/70 | HR 76 | Temp 98.0°F | Ht 60.0 in | Wt 158.6 lb

## 2022-07-30 DIAGNOSIS — N1832 Chronic kidney disease, stage 3b: Secondary | ICD-10-CM | POA: Diagnosis not present

## 2022-07-30 DIAGNOSIS — I701 Atherosclerosis of renal artery: Secondary | ICD-10-CM | POA: Diagnosis not present

## 2022-07-30 DIAGNOSIS — R052 Subacute cough: Secondary | ICD-10-CM

## 2022-07-30 DIAGNOSIS — I10 Essential (primary) hypertension: Secondary | ICD-10-CM

## 2022-07-30 MED ORDER — PREDNISONE 10 MG PO TABS: ORAL_TABLET | ORAL | 0 refills | Status: DC

## 2022-07-30 MED ORDER — HYDROCOD POLI-CHLORPHE POLI ER 10-8 MG/5ML PO SUER: 5.0000 mL | Freq: Two times a day (BID) | ORAL | 0 refills | Status: DC | PRN

## 2022-07-30 MED ORDER — FEXOFENADINE HCL 60 MG PO TABS
60.0000 mg | ORAL_TABLET | Freq: Every day | ORAL | 1 refills | Status: DC
Start: 2022-07-30 — End: 2022-09-01

## 2022-07-30 NOTE — Progress Notes (Signed)
Subjective:  Patient ID: Sabrina [name], female    DOB: 1941/04/21  Age: 81 y.o. MRN: [id number]  CC: The primary encounter diagnosis was Subacute cough. Diagnoses of Stage 3b chronic kidney disease ([place]) and Essential hypertension were also pertinent to this visit. 103.7   HPI Delfina A Wich presents for follow up on multiple issues:  1) she has had a persistent non productive cough x 3 weeks.  The cough occurs day and night ,  accompanied by clear sputum.  No shortness of breath. She reports rhinorrhea,  but no rhinitis,  sinus congestion or sinus pain.   She recalls that she Had a fever of 103.7 on Sept 22 and was evaluated via telephone by [facility] after undergoing testing for COVIDM flu and RSV.  All were negative;  she was given a cough suppressant.  .  New medications :  she has been taking lisinopril and ranexa since her discharge from [facility] in May after her endovascular procedure was complicated by NSTEMI . Losartan hct was discontinued during hospitalization    2)  elevated blood pressure:  home readings have been done daily ; her most recent check at home rwas  114/60 .   She reports compliance with her current  regimen of labetalol 300 mg bid, lisinopril 5 mg  amlodipine 10 mg , and uses hydralazine  as needed for SBP > 150  3) CKD:  she has not seen her nephrologist Dr Juleen China since October 2019.  She states that she thought she was seeing her nephrologist but was actually seeing hematology for her chronic anemia.   Outpatient Medications Prior to Visit  Medication Sig Dispense Refill   acetaminophen (TYLENOL) 325 MG tablet Take 1-2 tablets (325-650 mg total) by mouth every 4 (four) hours as needed for mild pain (or temp >/= 101 F).     amLODipine (NORVASC) 10 MG tablet Take 1 tablet (10 mg total) by mouth at bedtime. 90 tablet 1   aspirin 81 MG tablet Take 81 mg by mouth daily.     clopidogrel (PLAVIX) 75 MG tablet Take 1 tablet (75 mg total) by mouth at bedtime. To prevent  strokes 90 tablet 1   ezetimibe (ZETIA) 10 MG tablet Take 1 tablet (10 mg total) by mouth at bedtime. 90 tablet 1   ferrous sulfate 325 (65 FE) MG tablet Take 1 tablet (325 mg total) by mouth daily with breakfast. 30 tablet 3   labetalol (NORMODYNE) 300 MG tablet Take 1 tablet (300 mg total) by mouth 2 (two) times daily. For hypertension 180 tablet 1   lisinopril (ZESTRIL) 5 MG tablet Take 1 tablet (5 mg total) by mouth daily. 90 tablet 1   nortriptyline (PAMELOR) 10 MG capsule Take 1 capsule (10 mg total) by mouth 2 (two) times daily. For chronic headaches 180 capsule 3   pantoprazole (PROTONIX) 40 MG tablet One tablet upon waking,  take 30 minuts prior to eating 90 tablet 3   oxyCODONE-acetaminophen (PERCOCET/ROXICET) 5-325 MG tablet Take 1-2 tablets by mouth every 4 (four) hours as needed for moderate pain. 30 tablet 0   ranolazine (RANEXA) 500 MG 12 hr tablet Take 1 tablet (500 mg total) by mouth 2 (two) times daily. 60 tablet 3   No facility-administered medications prior to visit.    Review of Systems;  Patient denies headache, fevers, malaise, unintentional weight loss, skin rash, eye pain, sinus congestion and sinus pain, sore throat, dysphagia,  hemoptysis , cough, dyspnea, wheezing, chest pain, palpitations, orthopnea,  edema, abdominal pain, nausea, melena, diarrhea, constipation, flank pain, dysuria, hematuria, urinary  Frequency, nocturia, numbness, tingling, seizures,  Focal weakness, Loss of consciousness,  Tremor, insomnia, depression, anxiety, and suicidal ideation.      Objective:  BP (!) 190/70 (BP Location: Left Arm, Patient Position: Sitting, Cuff Size: Normal)   Pulse 76   Temp 98 F (36.7 C) (Oral)   Ht 5' (1.524 m)   Wt 158 lb 9.6 oz (71.9 kg)   SpO2 96%   BMI 30.97 kg/m   BP Readings from Last 3 Encounters:  07/30/22 (!) 190/70  07/26/22 (!) 161/67  07/16/22 136/68    Wt Readings from Last 3 Encounters:  07/30/22 158 lb 9.6 oz (71.9 kg)  07/26/22 158 lb  1.6 oz (71.7 kg)  07/16/22 154 lb (69.9 kg)    General appearance: alert, cooperative and appears stated age Ears: normal TM's and external ear canals both ears Throat: lips, mucosa, and tongue normal; teeth and gums normal Neck: no adenopathy, no carotid bruit, supple, symmetrical, trachea midline and thyroid not enlarged, symmetric, no tenderness/mass/nodules Back: symmetric, no curvature. ROM normal. No CVA tenderness. Lungs: clear to auscultation bilaterally Heart: regular rate and rhythm, S1, S2 normal, no murmur, click, rub or gallop Abdomen: soft, non-tender; bowel sounds normal; no masses,  no organomegaly Pulses: 2+ and symmetric Skin: Skin color, texture, turgor normal. No rashes or lesions Lymph nodes: Cervical, supraclavicular, and axillary nodes normal. Neuro:  awake and interactive with normal mood and affect. Higher cortical functions are normal. Speech is clear without word-finding difficulty or dysarthria. Extraocular movements are intact. Visual fields of both eyes are grossly intact. Sensation to light touch is grossly intact bilaterally of upper and lower extremities. Motor examination shows 4+/5 symmetric hand grip and upper extremity and 5/5 lower extremity strength. There is no pronation or drift. Gait is non-ataxic   Lab Results  Component Value Date   HGBA1C 5.9 07/19/2016   HGBA1C 5.8 06/02/2015    Lab Results  Component Value Date   CREATININE 1.48 (H) 04/14/2022   CREATININE 1.49 (H) 04/12/2022   CREATININE 1.34 (H) 04/11/2022    Lab Results  Component Value Date   WBC 5.0 07/20/2022   HGB 11.8 (L) 07/20/2022   HCT 34.2 (L) 07/20/2022   PLT 286 07/20/2022   GLUCOSE 102 (H) 04/14/2022   CHOL 206 (H) 01/28/2022   TRIG 61 01/28/2022   HDL 68 01/28/2022   LDLDIRECT 109.0 03/05/2016   LDLCALC 123 (H) 01/28/2022   ALT 16 04/06/2022   AST 23 04/06/2022   NA 137 04/14/2022   K 3.6 04/14/2022   CL 103 04/14/2022   CREATININE 1.48 (H) 04/14/2022   BUN  15 04/14/2022   CO2 26 04/14/2022   TSH 7.67 (H) 06/22/2021   INR 1.3 (H) 03/31/2022   HGBA1C 5.9 07/19/2016    [place] EKG SITE RITE  Result Date: 04/05/2022 If Site Rite image not attached, placement could not be confirmed due to current cardiac rhythm.   Assessment & Plan:   Problem List Items Addressed This Visit     Essential hypertension    During May admission losartan hct was stopped and lisinopril started , at a much lower dose  However shs has developed a chronic cough which has been present for the past  3 weeks.  Will consider change to losaran wihtout hct if the chest x ray is negative continue use of hydralazine for sbp > 150       Relevant  Medications   ranolazine (RANEXA) 500 MG 12 hr tablet   Cough - Primary    3 week history.  Chest x tay ordered,  Cough suppression and Acid suppression employed,  Will consider stopping ACE inhibitor of cough persists       Relevant Medications   chlorpheniramine-HYDROcodone (TUSSIONEX) 10-8 MG/5ML   Other Relevant Orders   DG Chest 2 View   CKD (chronic kidney disease), stage III (HCC) (Chronic)    GFR has returned to baseline after AKI during hospitalization.  She will reestablish care with nephrology   Lab Results  Component Value Date   CREATININE 1.48 (H) 04/14/2022         Relevant Orders   Ambulatory referral to Nephrology    I spent a total of  26 minutes with this patient in a face to face visit on the date of this encounter reviewing the last office visit with me in  July, her  most recent visit with [facility],  vascular surgery , home blood pressure readings, recent labs and imaging studies ,   and post visit ordering of testing and therapeutics.    Follow-up: Return in about 4 weeks (around 08/27/2022).   Crecencio Mc, MD

## 2022-07-30 NOTE — Patient Instructions (Addendum)
The cough may be due to the lisinopril,  or it may be due to allergies.    I want you to take Tussionex   cough syrup   max dose is 5 ml  every 12 hours for the cough.  THIS IS STRONG MEDICINE, CONTAINS HYDROCODONE AND MAY CAUSE SEDATION OR CONSTIPATION   Please take Allegra once daily to treat allergies   6 DAY prednisone taper for the inflammation   If the cough does not resolve in a week  ,  let me know so I can stop the lisinopril ad start a new medication   Take the hydralazine for any blood pressure readings over 150  at home  Referral to nephrologist dr Juleen China in in progress

## 2022-08-01 ENCOUNTER — Encounter: Payer: Self-pay | Admitting: Internal Medicine

## 2022-08-01 DIAGNOSIS — I130 Hypertensive heart and chronic kidney disease with heart failure and stage 1 through stage 4 chronic kidney disease, or unspecified chronic kidney disease: Secondary | ICD-10-CM

## 2022-08-01 DIAGNOSIS — R059 Cough, unspecified: Secondary | ICD-10-CM | POA: Insufficient documentation

## 2022-08-01 DIAGNOSIS — K219 Gastro-esophageal reflux disease without esophagitis: Secondary | ICD-10-CM

## 2022-08-01 DIAGNOSIS — I10 Essential (primary) hypertension: Secondary | ICD-10-CM

## 2022-08-01 MED ORDER — RANOLAZINE ER 500 MG PO TB12
500.0000 mg | ORAL_TABLET | Freq: Two times a day (BID) | ORAL | 3 refills | Status: DC
Start: 2022-08-01 — End: 2024-01-03

## 2022-08-01 NOTE — Assessment & Plan Note (Signed)
GFR has returned to baseline after AKI during hospitalization.  She will reestablish care with nephrology   Lab Results  Component Value Date   CREATININE 1.48 (H) 04/14/2022

## 2022-08-01 NOTE — Assessment & Plan Note (Signed)
3 week history.  Chest x tay ordered,  Cough suppression and Acid suppression employed,  Will consider stopping ACE inhibitor of cough persists

## 2022-08-01 NOTE — Assessment & Plan Note (Signed)
During May admission losartan hct was stopped and lisinopril started , at a much lower dose  However shs has developed a chronic cough which has been present for the past  3 weeks.  Will consider change to losaran wihtout hct if the chest x ray is negative continue use of hydralazine for sbp > 150

## 2022-08-02 MED ORDER — AMLODIPINE BESYLATE 10 MG PO TABS: 10.0000 mg | ORAL_TABLET | Freq: Every day | ORAL | 1 refills | Status: DC

## 2022-08-02 MED ORDER — LISINOPRIL 5 MG PO TABS: 5.0000 mg | ORAL_TABLET | Freq: Every day | ORAL | 1 refills | Status: DC

## 2022-08-02 MED ORDER — CLOPIDOGREL BISULFATE 75 MG PO TABS: 75.0000 mg | ORAL_TABLET | Freq: Every day | ORAL | 1 refills | Status: DC

## 2022-08-03 ENCOUNTER — Telehealth: Payer: Self-pay | Admitting: *Deleted

## 2022-08-03 NOTE — Patient Outreach (Signed)
  Care Coordination   Initial Visit Note   08/03/2022 Name: Sabrina [name] MRN: [id number] DOB: 09/17/1941  Sabrina [name] is a 81 y.o. year old female who sees Crecencio Mc, MD for primary care. I spoke with  Kinzie A Faxon by phone today.  What matters to the patients health and wellness today?  My Blood pressure, my kidney disease    Goals Addressed             This Visit's Progress    Develop Plan of care for Management of Hypertension and CHF          SDOH assessments and interventions completed:  Yes     Care Coordination Interventions Activated:  Yes  Care Coordination Interventions:  Yes, provided   Follow up plan: Follow up call scheduled for Quinn Plowman [phone] [id number] PM   ( Time patient requested)  Encounter Outcome:  Pt. Scheduled   [facility] [phone]

## 2022-08-05 ENCOUNTER — Other Ambulatory Visit (INDEPENDENT_AMBULATORY_CARE_PROVIDER_SITE_OTHER): Payer: Medicare Other

## 2022-08-05 ENCOUNTER — Ambulatory Visit (INDEPENDENT_AMBULATORY_CARE_PROVIDER_SITE_OTHER): Payer: Medicare Other

## 2022-08-05 DIAGNOSIS — R052 Subacute cough: Secondary | ICD-10-CM

## 2022-08-05 DIAGNOSIS — Z23 Encounter for immunization: Secondary | ICD-10-CM | POA: Diagnosis not present

## 2022-08-05 DIAGNOSIS — R059 Cough, unspecified: Secondary | ICD-10-CM | POA: Diagnosis not present

## 2022-08-06 ENCOUNTER — Other Ambulatory Visit: Payer: Self-pay | Admitting: *Deleted

## 2022-08-08 MED ORDER — AZITHROMYCIN 500 MG PO TABS: 500.0000 mg | ORAL_TABLET | Freq: Every day | ORAL | 0 refills | Status: DC

## 2022-08-08 NOTE — Assessment & Plan Note (Signed)
Chest x ray with interstitial prominence concerning for inflammation or infection.  Will treat with azithromycin .  If cough persists,  Will stop ACE Inhibitor and resume ARB

## 2022-08-13 ENCOUNTER — Encounter: Payer: Self-pay | Admitting: Internal Medicine

## 2022-08-23 ENCOUNTER — Telehealth: Payer: Self-pay | Admitting: Internal Medicine

## 2022-08-23 ENCOUNTER — Encounter (INDEPENDENT_AMBULATORY_CARE_PROVIDER_SITE_OTHER): Payer: Self-pay

## 2022-08-23 ENCOUNTER — Ambulatory Visit: Payer: Self-pay

## 2022-08-23 NOTE — Telephone Encounter (Addendum)
Pt called stating Mcarthur Rossetti will fax a from to the provider that she need a PA for tussinex. Pt is not asking for another medication, she wanted to get her money back because she was not suppose to pay twice

## 2022-08-23 NOTE — Patient Outreach (Signed)
  Care Coordination   Follow Up Visit Note   08/23/2022 Name: Sabrina [name] MRN: [id number] DOB: [birth year]  Sabrina [name] is a 81 y.o. year old female who sees Crecencio Mc, MD for primary care. I spoke with  Sabrina [name] by phone today.  What matters to the patients health and wellness today? Patient states she continues to having an ongoing cough. She states her cough worsened after being put on Lisinopril in May 2023.  Patient states she's been given cough syrup by her primary care provider but it has not worked.   She states the cough is effecting her quality of life.  Patient states her provider requested to be notified if she continues having the cough.  Patient states she's had issues with her blood pressure being elevated.  She feels that it has been more manageable lately with her current medications.  Patient reports blood pressure readings:  160/80, 143/70, 120/75, 169/113, 144/69, 146/67.  She states if blood pressure gets very elevated she experiences dizziness and blurred vision.   She states she is scheduled to see a nephrologist in the next month or so.     Goals Addressed             This Visit's Progress    Patient stated:  Management of health conditions       Care Coordination Interventions: Evaluation of current treatment plan related to HTN,  CKD 4 and patient's adherence to plan as established by provider Reviewed medications with patient and discussed importance of compliance Reviewed scheduled/upcoming provider appointments Discussed plans with patient for ongoing care management follow up and provided patient with direct contact information for care management team Social determinants of health assessed Message sent to Dr. Derrel Nip regarding patients ongoing coughing Advised to continue to monitor blood pressure daily and record Advised to adhere to low salt diet.              SDOH assessments and interventions completed:  Yes  SDOH  Interventions Today    Flowsheet Row Most Recent Value  SDOH Interventions   Food Insecurity Interventions Intervention Not Indicated  Housing Interventions Intervention Not Indicated  Transportation Interventions Intervention Not Indicated        Care Coordination Interventions Activated:  Yes  Care Coordination Interventions:  Yes, provided   Follow up plan: Follow up call scheduled for 09/13/22 at 1:30 pm    Encounter Outcome:  Pt. Visit Completed   Quinn Plowman RN,BSN,CCM [place] [phone] direct line

## 2022-08-24 ENCOUNTER — Telehealth: Payer: Self-pay

## 2022-08-24 NOTE — Patient Outreach (Signed)
  Care Coordination   Follow Up Visit Note   08/24/2022 Name: Kasheena AMIRACLE NEISES MRN: [id number] DOB: [birth year]  Dawanna A Devin is a 81 y.o. year old female who sees Crecencio Mc, MD for primary care. I spoke with  Krishna A Tarpley by phone today.  What matters to the patients health and wellness today?  Message received from Dr. Derrel Nip requesting patient schedule follow up visit to discuss worsening cough concerns.     Goals Addressed             This Visit's Progress    Patient stated:  Management of health conditions       Care Coordination Interventions: Called patient to inform her that her primary care provider wants her to schedule a visit due to worsening cough concerns.            SDOH assessments and interventions completed:  No     Care Coordination Interventions Activated:  Yes  Care Coordination Interventions:  Yes, provided   Follow up plan:  as previously scheduled    Encounter Outcome:  Pt. Visit Completed   Quinn Plowman RN,BSN,CCM [facility] [phone] direct line

## 2022-08-25 NOTE — Telephone Encounter (Signed)
Waiting on fax from [facility]

## 2022-09-01 ENCOUNTER — Encounter: Payer: Self-pay | Admitting: Internal Medicine

## 2022-09-01 ENCOUNTER — Ambulatory Visit (INDEPENDENT_AMBULATORY_CARE_PROVIDER_SITE_OTHER): Payer: Medicare Other | Admitting: Internal Medicine

## 2022-09-01 VITALS — BP 124/64 | HR 71 | Temp 97.9°F | Ht 60.0 in | Wt 161.8 lb

## 2022-09-01 DIAGNOSIS — N183 Chronic kidney disease, stage 3 unspecified: Secondary | ICD-10-CM

## 2022-09-01 DIAGNOSIS — R9389 Abnormal findings on diagnostic imaging of other specified body structures: Secondary | ICD-10-CM | POA: Diagnosis not present

## 2022-09-01 DIAGNOSIS — M48062 Spinal stenosis, lumbar region with neurogenic claudication: Secondary | ICD-10-CM

## 2022-09-01 DIAGNOSIS — R052 Subacute cough: Secondary | ICD-10-CM | POA: Diagnosis not present

## 2022-09-01 DIAGNOSIS — I130 Hypertensive heart and chronic kidney disease with heart failure and stage 1 through stage 4 chronic kidney disease, or unspecified chronic kidney disease: Secondary | ICD-10-CM

## 2022-09-01 DIAGNOSIS — I701 Atherosclerosis of renal artery: Secondary | ICD-10-CM | POA: Diagnosis not present

## 2022-09-01 MED ORDER — CHERATUSSIN AC 100-10 MG/5ML PO SOLN: 10.0000 mL | Freq: Three times a day (TID) | ORAL | 3 refills | Status: DC | PRN

## 2022-09-01 MED ORDER — LOSARTAN POTASSIUM 50 MG PO TABS: 50.0000 mg | ORAL_TABLET | Freq: Every day | ORAL | 0 refills | Status: DC

## 2022-09-01 MED ORDER — HYDROCODONE-ACETAMINOPHEN 10-325 MG PO TABS: 1.0000 | ORAL_TABLET | Freq: Four times a day (QID) | ORAL | 0 refills | Status: AC | PRN

## 2022-09-01 NOTE — Patient Instructions (Signed)
STOP TAKING LISINOPRIL. IT MAY BE CAUSING YOUR COUGH  RESUME LOSARTAN 50 MG DAILY FOR BLOOD PRESSURE INSTEAD OF LISINOPRIL  NEW COUGH MEDICINE: CHERATUSSIN 10 ML  UP TO 3 TIMES DAILY TO SUPPRESS  COUGH  PAIN MEDICATION :  HYDROCODONE 1 TABLET EVERY 4 TO 6 HOURS AS NEEDED FOR BACK PAIN  DO NOT LET YOURSELF GET CONSTIPATED.    USE DULCOLAX IF NECESSARY   I AM ORDERING PHYSICAL THERAPY TO STRENGTHEN YOUR BACK MUSCLES

## 2022-09-01 NOTE — Progress Notes (Signed)
Subjective:  Patient ID: Sabrina [name], female    DOB: 1941/06/24  Age: 81 y.o. MRN: [id number]  CC: The primary encounter diagnosis was Abnormal chest x-ray. 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   HPI Sabrina [name] presents for follow up n multiple issues, mostly pain and persistent cough  Chief Complaint  Patient presents with   Follow-up    1) persistent cough:  she was evaluated  October 6 for 3 week history of cough .  Chest x ray noted Peribronchial cuffing bilaterally with interstitial thickening, may be infectious or inflammatory and 2. Cardiomegaly..  she was given  azithromycin,  cough suppression and Acid suppression with no change . She is taking an ACE inhibitor (ARB was stopped during admission several months ago and lisinopril started, for unclear easons). Has not seen pulmonology Raul Del) in a year.  Dx of COPD  Quit smoking 23 years ago    2) Back and hip pain, chronic.   Spends most of the day In bed.  Has pain in back after walking 10 steps . Pain relieved by sitting down.   Has seen the pain clinic and had mutiple ESI 's  which did not help.  Tylenol does not help so she has  stopped taking it.   Not a candidate for surgery.     3) Tired all the time,  does not feel like doing anything  .  Sleep habits are poor.  Averages 4 hours of sleep .  Watches tv until 1 am   . Gets up at 4 to take meds .  Then eats a bowl of cereal at 4:30 and goes back to bed!  4) bilateral thigh pain and "swelling"  since her bilateral SFA stenting in May   5) HTN:  home readings reviewed:  only 4 of 14 are > 660 systolic .  Taking lisinopril 5 mg instead of losartan since discharge   6) CAD:  saw [facility] in Sept.  No changes made .  6 month follow up  diastolic dysfunction mentioned. She is concerned about the finding of  "cardiomegaly" on her chest x ray .  Explained to her that cardiomegaly is not a diagnosis.      Outpatient Medications Prior to Visit  Medication Sig Dispense Refill   amLODipine (NORVASC) 10 MG tablet Take 1 tablet (10 mg total) by mouth at bedtime. 90 tablet 1   aspirin 81 MG tablet Take 81 mg by mouth daily.     clopidogrel (PLAVIX) 75 MG tablet Take 1 tablet (75 mg total) by mouth at bedtime. To prevent strokes 90 tablet 1   ezetimibe (ZETIA) 10 MG tablet Take 1 tablet (10 mg total) by mouth at bedtime. 90 tablet 1   ferrous sulfate 325 (65 FE) MG tablet Take 1 tablet (325 mg total) by mouth daily with breakfast. 30 tablet 3   labetalol (NORMODYNE) 300 MG tablet Take 1 tablet (300 mg total) by mouth 2 (two) times daily. For hypertension 180 tablet 1   nortriptyline (PAMELOR) 10 MG capsule Take 1 capsule (10 mg total) by mouth 2 (two) times daily. For chronic headaches 180 capsule 3   pantoprazole (PROTONIX) 40 MG tablet One tablet upon waking,  take 30 minuts prior to eating 90 tablet 3   ranolazine (RANEXA) 500 MG 12 hr tablet Take 1 tablet (500 mg total)  by mouth 2 (two) times daily. 60 tablet 3   lisinopril (ZESTRIL) 5 MG tablet Take 1 tablet (5 mg total) by mouth daily. 90 tablet 1   acetaminophen (TYLENOL) 325 MG tablet Take 1-2 tablets (325-650 mg total) by mouth every 4 (four) hours as needed for mild pain (or temp >/= 101 F). (Patient not taking: Reported on 09/01/2022)     azithromycin (ZITHROMAX) 500 MG tablet Take 1 tablet (500 mg total) by mouth daily. (Patient not taking: Reported on 09/01/2022) 7 tablet 0   chlorpheniramine-HYDROcodone (TUSSIONEX) 10-8 MG/5ML Take 5 mLs by mouth every 12 (twelve) hours as needed for cough. (Patient not taking: Reported on 09/01/2022) 200 mL 0   fexofenadine (ALLEGRA) 60 MG tablet Take 1 tablet (60 mg total) by mouth daily. (Patient not taking: Reported on 09/01/2022) 90 tablet 1   No facility-administered medications prior to visit.    Review of  Systems;  Patient denies headache, fevers, malaise, unintentional weight loss, skin rash, eye pain, sinus congestion and sinus pain, sore throat, dysphagia,  hemoptysis , cough, dyspnea, wheezing, chest pain, palpitations, orthopnea, edema, abdominal pain, nausea, melena, diarrhea, constipation, flank pain, dysuria, hematuria, urinary  Frequency, nocturia, numbness, tingling, seizures,  Focal weakness, Loss of consciousness,  Tremor, insomnia, depression, anxiety, and suicidal ideation.      Objective:  BP 124/64 (BP Location: Left Arm, Patient Position: Sitting, Cuff Size: Normal)   Pulse 71   Temp 97.9 F (36.6 C) (Oral)   Ht 5' (1.524 m)   Wt 161 lb 12.8 oz (73.4 kg)   SpO2 97%   BMI 31.60 kg/m   BP Readings from Last 3 Encounters:  09/01/22 124/64  07/30/22 (!) 190/70  07/26/22 (!) 161/67    Wt Readings from Last 3 Encounters:  09/01/22 161 lb 12.8 oz (73.4 kg)  07/30/22 158 lb 9.6 oz (71.9 kg)  07/26/22 158 lb 1.6 oz (71.7 kg)    General appearance: alert, cooperative and appears stated age Ears: normal TM's and external ear canals both ears Throat: lips, mucosa, and tongue normal; teeth and gums normal Neck: no adenopathy, no carotid bruit, supple, symmetrical, trachea midline and thyroid not enlarged, symmetric, no tenderness/mass/nodules Back: symmetric, no curvature. ROM normal. No CVA tenderness. Lungs: clear to auscultation bilaterally Heart: regular rate and rhythm, S1, S2 normal, no murmur, click, rub or gallop Abdomen: soft, non-tender; bowel sounds normal; no masses,  no organomegaly Pulses: 2+ and symmetric Skin: Skin color, texture, turgor normal. No rashes or lesions Lymph nodes: Cervical, supraclavicular, and axillary nodes normal. Neuro:  awake and interactive with normal mood and affect. Higher cortical functions are normal. Speech is clear without word-finding difficulty or dysarthria. Extraocular movements are intact. Visual fields of both eyes are  grossly intact. Sensation to light touch is grossly intact bilaterally of upper and lower extremities. Motor examination shows 4+/5 symmetric hand grip and upper extremity and 5/5 lower extremity strength. There is no pronation or drift. Gait is non-ataxic   Lab Results  Component Value Date   HGBA1C 5.9 07/19/2016   HGBA1C 5.8 06/02/2015    Lab Results  Component Value Date   CREATININE 1.48 (H) 04/14/2022   CREATININE 1.49 (H) 04/12/2022   CREATININE 1.34 (H) 04/11/2022    Lab Results  Component Value Date   WBC 5.0 07/20/2022   HGB 11.8 (L) 07/20/2022   HCT 34.2 (L) 07/20/2022   PLT 286 07/20/2022   GLUCOSE 102 (H) 04/14/2022   CHOL 206 (H) 01/28/2022   TRIG  61 01/28/2022   HDL 68 01/28/2022   LDLDIRECT 109.0 03/05/2016   LDLCALC 123 (H) 01/28/2022   ALT 16 04/06/2022   AST 23 04/06/2022   NA 137 04/14/2022   K 3.6 04/14/2022   CL 103 04/14/2022   CREATININE 1.48 (H) 04/14/2022   BUN 15 04/14/2022   CO2 26 04/14/2022   TSH 7.67 (H) 06/22/2021   INR 1.3 (H) 03/31/2022   HGBA1C 5.9 07/19/2016    [place] EKG SITE RITE  Result Date: 04/05/2022 If Site Rite image not attached, placement could not be confirmed due to current cardiac rhythm.   Assessment & Plan:   Problem List Items Addressed This Visit     Lumbar stenosis with neurogenic claudication    No options remain except for PT to prevent further deconditoning, and start opiods for pain management . Hydrocodone 10/325 given #28 tablets for first rx for 7 days.        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  Stoppinglisinopril.  Continue cough suppression.   Repeat chest x ray       Other Visit Diagnoses     Abnormal chest x-ray    -  Primary   Relevant Orders   DG Chest 2 View       I spent a total of 40  minutes with this patient in a face to face visit on the date of this encounter reviewing the last office visit with me in  October,   most recent visit with Dr Clayborn Bigness and Dr Raul Del ., home blood pressure  readings, recent  labs and imaging studies ,   and post visit ordering of testing and therapeutics.    Follow-up: No follow-ups on file.   Crecencio Mc, MD

## 2022-09-01 NOTE — Assessment & Plan Note (Signed)
No options remain except for PT to prevent further deconditoning, and start opiods for pain management . Hydrocodone 10/325 given #28 tablets for first rx for 7 days.

## 2022-09-01 NOTE — Assessment & Plan Note (Signed)
Persistent despite treatment for pneumonia.  Stoppinglisinopril.  Continue cough suppression.  Repeat chest x ray

## 2022-09-01 NOTE — Assessment & Plan Note (Signed)
Changing medication from lisinopril to losartan

## 2022-09-10 ENCOUNTER — Ambulatory Visit (INDEPENDENT_AMBULATORY_CARE_PROVIDER_SITE_OTHER): Payer: Medicare Other | Admitting: Vascular Surgery

## 2022-09-10 ENCOUNTER — Ambulatory Visit (HOSPITAL_COMMUNITY)
Admission: RE | Admit: 2022-09-10 | Discharge: 2022-09-10 | Disposition: A | Discharge status: Home / Self Care | Payer: Medicare Other | Source: Ambulatory Visit | Attending: Vascular Surgery | Admitting: Vascular Surgery

## 2022-09-10 ENCOUNTER — Encounter: Payer: Self-pay | Admitting: Vascular Surgery

## 2022-09-10 ENCOUNTER — Other Ambulatory Visit: Payer: Self-pay | Admitting: *Deleted

## 2022-09-10 ENCOUNTER — Encounter: Payer: Self-pay | Admitting: *Deleted

## 2022-09-10 ENCOUNTER — Ambulatory Visit (INDEPENDENT_AMBULATORY_CARE_PROVIDER_SITE_OTHER)
Admission: RE | Admit: 2022-09-10 | Discharge: 2022-09-10 | Disposition: A | Discharge status: Home / Self Care | Payer: Medicare Other | Source: Ambulatory Visit | Attending: Vascular Surgery | Admitting: Vascular Surgery

## 2022-09-10 VITALS — BP 164/70 | HR 72 | Temp 98.4°F | Resp 20 | Ht 60.0 in | Wt 160.0 lb

## 2022-09-10 DIAGNOSIS — I739 Peripheral vascular disease, unspecified: Secondary | ICD-10-CM

## 2022-09-10 DIAGNOSIS — I6521 Occlusion and stenosis of right carotid artery: Secondary | ICD-10-CM

## 2022-09-10 DIAGNOSIS — Z9889 Other specified postprocedural states: Secondary | ICD-10-CM

## 2022-09-10 NOTE — Progress Notes (Unsigned)
Office Note    HPI: Sabrina [name] is a 81 y.o. (May 07, 1941) female presenting in follow up s/p right groin exploration, redo CFA patch plasty with ipsilateral GSV on 03/30/22.  Prior surgeries were performed at [facility] and include: Right renal artery stenting 32mm left renal artery stenting (59mm) (2015) Left subclavian artery stenting 59mm (2023) Bilateral common iliac artery stenting Bilateral external iliac stenting Left common femoral, profunda femoris, and superficial femoral artery endarterectomies Right common femoral, profunda femoris, and superficial femoral artery endarterectomies Stent placement to left external iliac artery with 8 mm diameter by 4 cm length of life star stent  On exam today, Korine was doing well, accompanied by friend.  She denied claudication, rest pain, ischemic tissue loss.  She is living independently at home with home nursing visiting twice a week.    Since her last appointment, she has been seen by [facility] and had staples removed from her left groin.   The pt is  on a daily aspirin.   Other AC:  plavix The pt is  on medication for hypertension.   The pt is not diabetic.  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 b.) TTE 11/16/2021: EF >55%; mild AS (MPG 9 mmHg)   CAD (coronary artery disease)    a.) s/p 3v CABG 03/29/2000   Cardiac arrest [facility])    Carotid artery stenosis    a.) s/p LEFT CEA 09/09/2003. b.) Carotid doppler 73/22/0254: 2-70% LICA, CTO RICA; subclavian stenosis   Cataracts, bilateral    Cervical  spondylosis without myelopathy    Chronic diastolic CHF (congestive heart failure), NYHA class 3 (HCC)    a.) TTE 05/27/2016: EF >55%, mild LA enlargement, triv PR, mild MR, mod TR; G3DD. b.) TTE 12/12/2017: EF >55%, mild LVH, BAE, mild MR/PR, mod TR; RVSP 52.8 mmHg. c.) TTE 03/18/2020: EF >55%, BAD, AS (MPG 8.7 mmHg); triv MR, mild TR/PR. d.) TTE 11/16/2021: EF >55%, LVH, G1DD, triv MR, mild PR, mod TR; AS (MPG 9 mmHg); MS (MPG 5 mmHg)   Chronic kidney disease, stage III (moderate) (HCC)    Chronic narcotic use 06/24/2014   Chronic pain syndrome    GERD (gastroesophageal reflux disease)    History of 2019 novel coronavirus disease (COVID-19) 09/22/2021   Hyperlipidemia    Hypertension    Long term current use of antithrombotics/antiplatelets    a.) on daily DAPT therapy (ASA + clopidogrel)   Lumbar stenosis with neurogenic claudication    Mitral stenosis 11/16/2021   a.) TTE 11/16/2021: EF >55%; mod MS (MPG 5 mmHg)   Osteoarthritis of hip    Postoperative wound infection 03/29/2022   Pulmonary hypertension ([place]) 12/12/2017   a.) TTE 12/12/2017: mild; RVSP 52.8 mmHg   PVD (peripheral vascular disease) (HCC)    Renal artery stenosis (HCC)    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    Past Surgical History:  Procedure Laterality Date   ABDOMINAL HYSTERECTOMY  1976   CAROTID ARTERY ANGIOPLASTY Left    CAROTID ENDARTERECTOMY Left 09/09/2003   Procedure: CAROTID ENDARTERECTOMY; Location: [facility]; Surgeon: Maura Crandall, MD   COLONOSCOPY WITH PROPOFOL N/A 08/16/2017   Procedure: COLONOSCOPY WITH PROPOFOL;  Surgeon: [name], MD;  Location: [facility];  Service: Endoscopy;  Laterality: N/A;   CORONARY ANGIOPLASTY WITH STENT PLACEMENT  2000   CORONARY ARTERY BYPASS GRAFT  N/A 03/29/2000   Procedure: 3v CORONARY ARTERY BYPASS GRAFT; Location: [facility]   CYSTOSCOPY WITH STENT PLACEMENT Bilateral    ENDARTERECTOMY FEMORAL Bilateral 03/17/2022   Procedure: ENDARTERECTOMY FEMORAL ( BILATERAL SFA STENT);  Surgeon: Katha Cabal, MD;  Location: [facility];  Service: Vascular;  Laterality: Bilateral;   ENDARTERECTOMY FEMORAL Right 03/30/2022   Procedure: RE-EXPOSURE OF RIGHT COMMON FEMORAL ARTERY, RE-DO RIGHT FEMORAL ENDARTERECTOMY WITH VEIN PATCH;  Surgeon: Broadus John, MD;  Location: [facility] OR;  Service: Vascular;  Laterality: Right;  WOUND VAC   ESOPHAGOGASTRODUODENOSCOPY (EGD) WITH PROPOFOL N/A 08/16/2017   Procedure: ESOPHAGOGASTRODUODENOSCOPY (EGD) WITH PROPOFOL;  Surgeon: [name], MD;  Location: [facility];  Service: Endoscopy;  Laterality: N/A;   ESOPHAGOGASTRODUODENOSCOPY (EGD) WITH PROPOFOL N/A 06/29/2018   Procedure: ESOPHAGOGASTRODUODENOSCOPY (EGD) WITH PROPOFOL;  Surgeon: Virgel Manifold, MD;  Location: [facility];  Service: Endoscopy;  Laterality: N/A;   GROIN DEBRIDEMENT Right 03/30/2022   Procedure: [place];  Surgeon: Broadus John, MD;  Location: [place];  Service: Vascular;  Laterality: Right;   INSERTION OF ILIAC STENT Left 03/17/2022   Procedure: INSERTION OF ILIAC STENT;  Surgeon: Katha Cabal, MD;  Location: [facility];  Service: Vascular;  Laterality: Left;   LAPAROSCOPIC CHOLECYSTECTOMY Left 10/26/1999   Procedure: LAPAROSCOPIC CHOLECYSTECTOMY; Location: [facility]; Surgeon: Rochel Brome, MD   LEFT HEART CATH AND CORONARY ANGIOGRAPHY N/A 11/18/2021   Procedure: LEFT HEART CATH AND CORONARY ANGIOGRAPHY;  Surgeon: Yolonda Kida, MD;  Location: [place];  Service: Cardiovascular;  Laterality: N/A;   LEFT HEART CATH AND CORS/GRAFTS ANGIOGRAPHY Left 11/19/2002   Procedure: LEFT HEART CATH AND CORS/GRAFTS ANGIOGRAPHY; Location: [place]; Surgeon: Katrine Coho, MD   LEFT HEART CATH AND CORS/GRAFTS  ANGIOGRAPHY Left 09/17/2003   Procedure: LEFT HEART CATH AND CORS/GRAFTS ANGIOGRAPHY; Location: [place]; Surgeon: Katrine Coho, MD   LOWER EXTREMITY ANGIOGRAPHY Right 01/06/2022   Procedure: Lower Extremity Angiography;  Surgeon: Katha Cabal, MD;  Location: [place] [facility];  Service: Cardiovascular;  Laterality: Right;   RENAL ARTERY ANGIOPLASTY Bilateral 12/2013   TOE AMPUTATION Right    small toe   TONSILLECTOMY AND ADENOIDECTOMY     TOTAL HIP ARTHROPLASTY Left 2005   TOTAL HIP ARTHROPLASTY Right 2015   UPPER EXTREMITY ANGIOGRAPHY Left 11/23/2021   Procedure: UPPER EXTREMITY ANGIOGRAPHY;  Surgeon: Algernon Huxley, MD;  Location: [place] [facility];  Service: Cardiovascular;  Laterality: Left;    Social History   Socioeconomic History   Marital status: Divorced    Spouse name: Not on file   Number of children: 3   Years of education: Not on file   Highest education level: Not on file  Occupational History   Occupation: retired  Tobacco Use   Smoking status: Former    Types: Cigarettes    Quit date: 10/25/1998    Years since quitting: 23.8   Smokeless tobacco: Never  Vaping Use   Vaping Use:  Never used  Substance and Sexual Activity   Alcohol use: No    Alcohol/week: 0.0 standard drinks of alcohol   Drug use: Never   Sexual activity: Not Currently  Other Topics Concern   Not on file  Social History Narrative   Daughter [name] ([place]); 1 in [employer]; 1 in [place]   Lives alone   Social Determinants of Health   Financial Resource Strain: Low Risk  (09/22/2017)   Overall Financial Resource Strain (CARDIA)    Difficulty of Paying Living Expenses: Not hard at all  Food Insecurity: No Food Insecurity (08/23/2022)   Hunger Vital Sign    Worried About Running Out of Food in the Last Year: Never true    Ran Out of Food in the Last Year: Never true  Transportation Needs: No Transportation Needs (08/23/2022)   PRAPARE - Hydrologist  (Medical): No    Lack of Transportation (Non-Medical): No  Physical Activity: Inactive (09/22/2017)   Exercise Vital Sign    Days of Exercise per Week: 0 days    Minutes of Exercise per Session: 0 min  Stress: Unknown (09/22/2017)   [place]    Feeling of Stress : Patient refused  Social Connections: Unknown (09/22/2017)   Social Connection and Isolation Panel [NHANES]    Frequency of Communication with Friends and Family: Patient refused    Frequency of Social Gatherings with Friends and Family: Patient refused    Attends Religious Services: Patient refused    Active Member of Clubs or Organizations: Patient refused    Attends Archivist Meetings: Patient refused    Marital Status: Patient refused  Intimate Partner Violence: Not At Risk (09/22/2017)   Humiliation, Afraid, Rape, and Kick questionnaire    Fear of Current or Ex-Partner: No    Emotionally Abused: No    Physically Abused: No    Sexually Abused: No    Family History  Problem Relation Age of Onset   Stroke Mother    Hypertension Mother    Diabetes Mother    Hypertension Father    Heart disease Sister        MI   Multiple sclerosis Daughter    Multiple sclerosis Son    Cerebral aneurysm Son    Seizures Son    Cerebral aneurysm Son    Breast cancer Paternal Aunt 6    Current Outpatient Medications  Medication Sig Dispense Refill   amLODipine (NORVASC) 10 MG tablet Take 1 tablet (10 mg total) by mouth at bedtime. 90 tablet 1   aspirin 81 MG tablet Take 81 mg by mouth daily.     clopidogrel (PLAVIX) 75 MG tablet Take 1 tablet (75 mg total) by mouth at bedtime. To prevent strokes 90 tablet 1   ezetimibe (ZETIA) 10 MG tablet Take 1 tablet (10 mg total) by mouth at bedtime. 90 tablet 1   ferrous sulfate 325 (65 FE) MG tablet Take 1 tablet (325 mg total) by mouth daily with breakfast. 30 tablet 3   guaiFENesin-codeine (CHERATUSSIN AC)  100-10 MG/5ML syrup Take 10 mLs by mouth 3 (three) times daily as needed for cough. 180 mL 3   labetalol (NORMODYNE) 300 MG tablet Take 1 tablet (300 mg total) by mouth 2 (two) times daily. For hypertension 180 tablet 1   losartan (COZAAR) 50 MG tablet Take 1 tablet (50 mg total) by mouth at bedtime. 90 tablet 0   nortriptyline (PAMELOR) 10  MG capsule Take 1 capsule (10 mg total) by mouth 2 (two) times daily. For chronic headaches 180 capsule 3   pantoprazole (PROTONIX) 40 MG tablet One tablet upon waking,  take 30 minuts prior to eating 90 tablet 3   ranolazine (RANEXA) 500 MG 12 hr tablet Take 1 tablet (500 mg total) by mouth 2 (two) times daily. 60 tablet 3   No current facility-administered medications for this visit.    Allergies  Allergen Reactions   Citalopram Anaphylaxis    Throat closing    Dilaudid [Hydromorphone Hcl] Nausea And Vomiting   Hydrochlorothiazide Other (See Comments)    Decreased GFR (Nov 2015)   Liothyronine     Hair fell out, caused headaches    Nsaids     CKD stage III - avoid nephrotoxic drugs   Nubain [Nalbuphine Hcl]     Burning sensation in back   Penicillins Itching   Prasugrel Itching   Statins Itching     REVIEW OF SYSTEMS:   [X]  denotes positive finding, [ ]  denotes negative finding Cardiac  Comments:  Chest pain or chest pressure:    Shortness of breath upon exertion:    Short of breath when lying flat:    Irregular heart rhythm:        Vascular    Pain in calf, thigh, or hip brought on by ambulation:    Pain in feet at night that wakes you up from your sleep:     Blood clot in your veins:    Leg swelling:         Pulmonary    Oxygen at home:    Productive cough:     Wheezing:         Neurologic    Sudden weakness in arms or legs:     Sudden numbness in arms or legs:     Sudden onset of difficulty speaking or slurred speech:    Temporary loss of vision in one eye:     Problems with dizziness:         Gastrointestinal    Blood  in stool:     Vomited blood:         Genitourinary    Burning when urinating:     Blood in urine:        Psychiatric    Major depression:         Hematologic    Bleeding problems:    Problems with blood clotting too easily:        Skin    Rashes or ulcers:        Constitutional    Fever or chills:      PHYSICAL EXAMINATION:  Vitals:   09/10/22 1032  BP: (!) 164/70  Pulse: 72  Resp: 20  Temp: 98.4 F (36.9 C)  SpO2: 99%  Weight: 160 lb (72.6 kg)  Height: 5' (1.524 m)    General:  WDWN in NAD; vital signs documented above Gait: Not observed HENT: WNL, normocephalic Pulmonary: normal non-labored breathing , without wheezing Cardiac: regular HR, Abdomen: soft, NT, no masses Skin: without rashes Vascular Exam/Pulses:  Right Left  Radial 2+ (normal) 2+ (normal)  Ulnar 2+ (normal) 2+ (normal)  Femoral    Popliteal    DP nonpalpable nonpalpable  PT nonpalpable nonpalpable   Extremities: without ischemic changes, without Gangrene , without cellulitis; without open wounds;  Right groin wound with 3 small areas opened, probe less than 1 cm, Musculoskeletal: no muscle wasting or atrophy  Neurologic: A&O X 3;  No focal weakness or paresthesias are detected Psychiatric:  The pt has Normal affect.   Non-Invasive Vascular Imaging:   non    ASSESSMENT/PLAN: Sabrina [name] is a 81 y.o. female presenting in follow up s/p right groin exploration, redo CFA patch plasty with ipsilateral GSV.    Sabrina [name] is undergone multiple lower extremity operations-see above.  Ultrasound imaging today demonstrated elevated velocities at both the right external and left external arteries.  The left external artery has a previously placed stent.  I have concern for in-stent restenosis and discussed bilateral lower extremity angiography from a radial approach in an effort to define, and possibly treat areas of inflow stenosis for assisted primary patency, iliac artery stents,  left-sided external iliac stent.  After discussing the risk and benefits, Leianne elected to proceed.  Her right groin has healed nicely. I am happy she has fully recovered. Pinki asked to continue coming to [facility] for her vascular care.  I asked that she discuss this further with [facility] as continuity of care is important when it comes to vascular disease.   She will also need her carotid disease followed if she has an occluded right ICA.  I asked that she continue her current medication regimen.    Broadus John, MD [facility] [phone]

## 2022-09-13 ENCOUNTER — Ambulatory Visit: Payer: Self-pay

## 2022-09-13 NOTE — Patient Outreach (Signed)
  Care Coordination   Follow Up Visit Note   09/13/2022 Name: Sabrina [name] MRN: [id number] DOB: [birth year]  Sabrina [name] is a 81 y.o. year old female who sees Crecencio Mc, MD for primary care. I spoke with  Sabrina [name] by phone today.  What matters to the patients health and wellness today?   Patient reports cough is better since provider switched her blood pressure medication from lisinopril to losartan.  Patient states blood pressure readings:  135/70, 132/52, 137/56, 151/78.  Patient states she continues to have ongoing back pain  that is " terrible" when she walks. She states she has no pain when she is sitting or laying down.   Patient reports her pain level is a 10-12 when walking.  Patient states she is scheduled to start outpatient physical therapy on 09/30/22.      Goals Addressed             This Visit's Progress    Patient stated:  Management of health conditions       Care Coordination Interventions: Patient advised to continue to monitor blood pressure and record.  Advised to notify provider for blood pressure outside of normal parameters as discussed Reviewed medications and discussed importance of compliance Reviewed scheduled/ upcoming appointments Advised to use heat/ cold packs for ongoing back pain.           SDOH assessments and interventions completed:  No     Care Coordination Interventions Activated:  Yes  Care Coordination Interventions:  Yes, provided   Follow up plan: Follow up call scheduled for 10/14/22    Encounter Outcome:  Pt. Visit Completed   Quinn Plowman RN,BSN,CCM [place] [phone] direct line

## 2022-09-20 ENCOUNTER — Telehealth: Payer: Self-pay

## 2022-09-20 NOTE — Telephone Encounter (Signed)
Pt's daughter [name] called to ask our thoughts on if she should fly from [place] for pt's procedure Wednesday. She is asking if we had any idea when he may schedule the next surgery, if there is one. I have explained that we do not have those answers and pt is aware, as she spoke to MD last week, as well. [name] has asked if she does not fly here, can MD call her after pt's procedure. MD has been sent staff message with this request.

## 2022-09-22 ENCOUNTER — Other Ambulatory Visit: Payer: Self-pay

## 2022-09-22 ENCOUNTER — Ambulatory Visit (HOSPITAL_BASED_OUTPATIENT_CLINIC_OR_DEPARTMENT_OTHER): Payer: Medicare Other

## 2022-09-22 ENCOUNTER — Encounter (HOSPITAL_COMMUNITY)
Admission: RE | Disposition: A | Discharge status: Home / Self Care | Payer: Self-pay | Source: Ambulatory Visit | Attending: Vascular Surgery

## 2022-09-22 ENCOUNTER — Ambulatory Visit (HOSPITAL_COMMUNITY)
Admission: RE | Admit: 2022-09-22 | Discharge: 2022-09-22 | Disposition: A | Discharge status: Home / Self Care | Payer: Medicare Other | Source: Ambulatory Visit | Attending: Vascular Surgery | Admitting: Vascular Surgery

## 2022-09-22 DIAGNOSIS — I701 Atherosclerosis of renal artery: Secondary | ICD-10-CM

## 2022-09-22 DIAGNOSIS — K551 Chronic vascular disorders of intestine: Secondary | ICD-10-CM | POA: Diagnosis not present

## 2022-09-22 DIAGNOSIS — I129 Hypertensive chronic kidney disease with stage 1 through stage 4 chronic kidney disease, or unspecified chronic kidney disease: Secondary | ICD-10-CM | POA: Insufficient documentation

## 2022-09-22 DIAGNOSIS — R944 Abnormal results of kidney function studies: Secondary | ICD-10-CM

## 2022-09-22 DIAGNOSIS — E785 Hyperlipidemia, unspecified: Secondary | ICD-10-CM | POA: Insufficient documentation

## 2022-09-22 DIAGNOSIS — N183 Chronic kidney disease, stage 3 unspecified: Secondary | ICD-10-CM | POA: Diagnosis not present

## 2022-09-22 DIAGNOSIS — I2510 Atherosclerotic heart disease of native coronary artery without angina pectoris: Secondary | ICD-10-CM | POA: Insufficient documentation

## 2022-09-22 DIAGNOSIS — Z9889 Other specified postprocedural states: Secondary | ICD-10-CM

## 2022-09-22 LAB — POCT I-STAT, CHEM 8
BUN: 34 mg/dL — ABNORMAL HIGH (ref 8–23)
Calcium, Ion: 1.23 mmol/L (ref 1.15–1.40)
Chloride: 105 mmol/L (ref 98–111)
Creatinine, Ser: 2.3 mg/dL — ABNORMAL HIGH (ref 0.44–1.00)
Glucose, Bld: 105 mg/dL — ABNORMAL HIGH (ref 70–99)
HCT: 32 % — ABNORMAL LOW (ref 36.0–46.0)
Hemoglobin: 10.9 g/dL — ABNORMAL LOW (ref 12.0–15.0)
Potassium: 4.3 mmol/L (ref 3.5–5.1)
Sodium: 138 mmol/L (ref 135–145)
TCO2: 24 mmol/L (ref 22–32)

## 2022-09-22 SURGERY — ABDOMINAL AORTOGRAM W/LOWER EXTREMITY
Anesthesia: LOCAL

## 2022-09-22 MED ORDER — HYDROCODONE-ACETAMINOPHEN 10-325 MG PO TABS
1.0000 | ORAL_TABLET | Freq: Once | ORAL | Status: AC
  Administered 2022-09-22: 1 via ORAL
  Filled 2022-09-22: qty 1

## 2022-09-22 MED ORDER — SODIUM CHLORIDE 0.9 % IV SOLN: INTRAVENOUS | Status: DC

## 2022-09-22 NOTE — Progress Notes (Signed)
Patient's case canceled this morning due to elevated creatinine.  Creatinine 1.5, currently 2.4. Patient with known bilateral renal artery stents, no stenosis present but duplex ultrasound in March. My plan is to reach out to her primary doctor Tullo for follow up, and obtain a duplex ultrasound of her bilateral renal arteries.  I have moved her case to next Wednesday in hopes that her creatinine begins to trend down.     If she has stenosis on her renal artery ultrasound, we will move forward with angiography for renal artery balloon angioplasty. If no stenosis is present, I will wait until her creatinine normalizes prior to inflow interventions.  We discussed the importance of hydration.  I have evaluated her meds and asked her to continue her current regimen.  Broadus John MD

## 2022-09-22 NOTE — Progress Notes (Signed)
Patient complaining of 8/10 headache. Patient requesting pain medication. Rennis Harding, RN made aware. Awaiting orders.

## 2022-09-22 NOTE — Progress Notes (Signed)
Renal artery ultrasound obtained. Bilateral renal arteries patent, stents patent, some stenosis compared to March, however this is not significant.  Stenosis is less than 60%.   This is not the reason for her acute on chronic kidney disease.  I will to follow the stents.  They do not require intervention at this time.   Broadus John MD

## 2022-09-23 ENCOUNTER — Ambulatory Visit (INDEPENDENT_AMBULATORY_CARE_PROVIDER_SITE_OTHER): Payer: Medicare Other | Admitting: Internal Medicine

## 2022-09-23 ENCOUNTER — Encounter: Payer: Self-pay | Admitting: Internal Medicine

## 2022-09-23 VITALS — BP 128/70 | HR 68 | Temp 98.2°F | Wt 161.6 lb

## 2022-09-23 DIAGNOSIS — D631 Anemia in chronic kidney disease: Secondary | ICD-10-CM | POA: Diagnosis not present

## 2022-09-23 DIAGNOSIS — N183 Chronic kidney disease, stage 3 unspecified: Secondary | ICD-10-CM

## 2022-09-23 DIAGNOSIS — I10 Essential (primary) hypertension: Secondary | ICD-10-CM | POA: Diagnosis not present

## 2022-09-23 DIAGNOSIS — I701 Atherosclerosis of renal artery: Secondary | ICD-10-CM | POA: Diagnosis not present

## 2022-09-23 DIAGNOSIS — N1832 Chronic kidney disease, stage 3b: Secondary | ICD-10-CM | POA: Diagnosis not present

## 2022-09-23 DIAGNOSIS — M48062 Spinal stenosis, lumbar region with neurogenic claudication: Secondary | ICD-10-CM | POA: Diagnosis not present

## 2022-09-23 DIAGNOSIS — N179 Acute kidney failure, unspecified: Secondary | ICD-10-CM

## 2022-09-23 DIAGNOSIS — N184 Chronic kidney disease, stage 4 (severe): Secondary | ICD-10-CM | POA: Diagnosis not present

## 2022-09-23 DIAGNOSIS — L299 Pruritus, unspecified: Secondary | ICD-10-CM | POA: Diagnosis not present

## 2022-09-23 LAB — RENAL FUNCTION PANEL
Albumin: 4 g/dL (ref 3.5–5.2)
BUN: 27 mg/dL — ABNORMAL HIGH (ref 6–23)
CO2: 26 mEq/L (ref 19–32)
Calcium: 9.3 mg/dL (ref 8.4–10.5)
Chloride: 105 mEq/L (ref 96–112)
Creatinine, Ser: 1.66 mg/dL — ABNORMAL HIGH (ref 0.40–1.20)
GFR: 28.95 mL/min — ABNORMAL LOW (ref >60.00)
Glucose, Bld: 95 mg/dL (ref 70–99)
Phosphorus: 3.8 mg/dL (ref 2.3–4.6)
Potassium: 4.2 mEq/L (ref 3.5–5.1)
Sodium: 137 mEq/L (ref 135–145)

## 2022-09-23 LAB — CBC WITH DIFFERENTIAL/PLATELET
Basophils Absolute: 0 10*3/uL (ref 0.0–0.1)
Basophils Relative: 0.7 % (ref 0.0–3.0)
Eosinophils Absolute: 0.5 10*3/uL (ref 0.0–0.7)
Eosinophils Relative: 10.5 % — ABNORMAL HIGH (ref 0.0–5.0)
HCT: 30.6 % — ABNORMAL LOW (ref 36.0–46.0)
Hemoglobin: 10.3 g/dL — ABNORMAL LOW (ref 12.0–15.0)
Lymphocytes Relative: 26.8 % (ref 12.0–46.0)
Lymphs Abs: 1.3 10*3/uL (ref 0.7–4.0)
MCHC: 33.8 g/dL (ref 30.0–36.0)
MCV: 88.1 fl (ref 78.0–100.0)
Monocytes Absolute: 0.4 10*3/uL (ref 0.1–1.0)
Monocytes Relative: 9 % (ref 3.0–12.0)
Neutro Abs: 2.6 10*3/uL (ref 1.4–7.7)
Neutrophils Relative %: 53 % (ref 43.0–77.0)
Platelets: 303 10*3/uL (ref 150.0–400.0)
RBC: 3.47 Mil/uL — ABNORMAL LOW (ref 3.87–5.11)
RDW: 14.1 % (ref 11.5–15.5)
WBC: 4.9 10*3/uL (ref 4.0–10.5)

## 2022-09-23 LAB — C-REACTIVE PROTEIN: CRP: <1 mg/dL (ref 0.5–20.0)

## 2022-09-23 LAB — SEDIMENTATION RATE: Sed Rate: 33 mm/hr — ABNORMAL HIGH (ref 0–30)

## 2022-09-23 MED ORDER — FERROUS SULFATE 325 (65 FE) MG PO TABS: 325.0000 mg | ORAL_TABLET | ORAL | 0 refills | Status: DC

## 2022-09-23 MED ORDER — HYDROCODONE-ACETAMINOPHEN 10-325 MG PO TABS: 1.0000 | ORAL_TABLET | Freq: Four times a day (QID) | ORAL | 0 refills | Status: DC | PRN

## 2022-09-23 NOTE — Assessment & Plan Note (Signed)
S/p hematology evaluation. Per last visit there was no indication for erythropoietin therapy. She asa advised to decrease  ferrous sulfate to every other day.

## 2022-09-23 NOTE — Patient Instructions (Addendum)
Change your moisturizer to Cetaphil or Eucerin  for moisturizer   You can increase the hydrocodone  to 2 to 3 daily    for your pain   I have refilled the medication    Tell Dr Holley Raring that I ordered labs today

## 2022-09-23 NOTE — Assessment & Plan Note (Signed)
Cause of her current decline in GFR is unclear.  Screening for MM ordered.  Will see nephrology this afternoon.

## 2022-09-23 NOTE — Progress Notes (Signed)
Subjective:  Patient ID: Sabrina [name], female    DOB: 1941/06/16  Age: 81 y.o. MRN: [id number]  CC: The primary encounter diagnosis was Acute renal failure superimposed on stage 4 chronic kidney disease, unspecified acute renal failure type ([place]). Diagnoses of Pruritus, Essential hypertension, Lumbar stenosis with neurogenic claudication, Acute renal failure superimposed on stage 3b chronic kidney disease, unspecified acute renal failure type ([place]), and Anemia of chronic kidney failure, stage 3 (moderate) ([place]) were also pertinent to this visit.   HPI Sabrina [name] presents for evaluation and management of change in renal function  Chief Complaint  Patient presents with   Follow-up    cardiology   Sabrina [name] is an 81 yr old female with extensive PAD, difficult to control hypertension ,  CAD s/p CABG , chronic lumosacral pain , and CKD Stage 3 with anemia, baseline GFR 36 ml/min( last checked in June 2023 post hospitalization)  , who was scheduled for a repeat vascular procedure on her lower extremity circulation  on Nov 29 which required postponement due to a significant decline in renal function   During her October follow up with me,  she reported a chronic cough which started after her medication was changed during hospitalization ( losartan was changed to lisinopril.)  A Chest  ray was normal, and lisinopril was dc'd and losartan resumed at 50 mg daily.  She reports resolution of cough since this medication change. No other medication changes  were done by me or reported by patient.  She has been increasing her intake of water for the last 2 months. She does  not use diuretics or NSAIDs.  She has been lost to nephrology follow up for the last 2 years,  but has an appt with Dr Holley Raring  this afternoon .  She is requesting that any labs be done here    She is in constant pain that significantly limits her activities.  She has been using hydrocodone once daily and states that It  provides 1-2 hours of pain relief   Lab Results  Component Value Date   CREATININE 2.30 (H) 09/22/2022   Lab Results  Component Value Date   WBC 5.0 07/20/2022   HGB 10.9 (L) 09/22/2022   HCT 32.0 (L) 09/22/2022   MCV 83.8 07/20/2022   PLT 286 07/20/2022      Outpatient Medications Prior to Visit  Medication Sig Dispense Refill   amLODipine (NORVASC) 10 MG tablet Take 1 tablet (10 mg total) by mouth at bedtime. 90 tablet 1   aspirin 81 MG tablet Take 81 mg by mouth daily.     clopidogrel (PLAVIX) 75 MG tablet Take 1 tablet (75 mg total) by mouth at bedtime. To prevent strokes 90 tablet 1   ezetimibe (ZETIA) 10 MG tablet Take 1 tablet (10 mg total) by mouth at bedtime. 90 tablet 1   labetalol (NORMODYNE) 300 MG tablet Take 1 tablet (300 mg total) by mouth 2 (two) times daily. For hypertension 180 tablet 1   losartan (COZAAR) 50 MG tablet Take 1 tablet (50 mg total) by mouth at bedtime. 90 tablet 0   nortriptyline (PAMELOR) 10 MG capsule Take 1 capsule (10 mg total) by mouth 2 (two) times daily. For chronic headaches 180 capsule 3   pantoprazole (PROTONIX) 40 MG tablet One tablet upon waking,  take 30 minuts prior to eating 90 tablet 3   ranolazine (RANEXA) 500 MG 12 hr tablet Take 1 tablet (500 mg total) by mouth 2 (two)  times daily. 60 tablet 3   ferrous sulfate 325 (65 FE) MG tablet Take 1 tablet (325 mg total) by mouth daily with breakfast. (Patient taking differently: Take 325 mg by mouth every other day.) 30 tablet 3   HYDROcodone-acetaminophen (NORCO) 10-325 MG tablet Take 1 tablet by mouth every 6 (six) hours as needed for moderate pain.     guaiFENesin-codeine (CHERATUSSIN AC) 100-10 MG/5ML syrup Take 10 mLs by mouth 3 (three) times daily as needed for cough. (Patient not taking: Reported on 09/21/2022) 180 mL 3   No facility-administered medications prior to visit.    Review of Systems;  Patient denies headache, fevers, malaise, unintentional weight loss, skin rash, eye  pain, sinus congestion and sinus pain, sore throat, dysphagia,  hemoptysis , cough, dyspnea, wheezing, chest pain, palpitations, orthopnea, edema, abdominal pain, nausea, melena, diarrhea, constipation, flank pain, dysuria, hematuria, urinary  Frequency, nocturia, numbness, tingling, seizures,  Focal weakness, Loss of consciousness,  Tremor, insomnia, depression, anxiety, and suicidal ideation.      Objective:  BP 128/70   Pulse 68   Temp 98.2 F (36.8 C) (Oral)   Wt 161 lb 9.6 oz (73.3 kg)   SpO2 99%   BMI 30.53 kg/m   BP Readings from Last 3 Encounters:  09/23/22 128/70  09/22/22 (!) 172/76  09/10/22 (!) 164/70    Wt Readings from Last 3 Encounters:  09/23/22 161 lb 9.6 oz (73.3 kg)  09/22/22 162 lb (73.5 kg)  09/10/22 160 lb (72.6 kg)    General appearance: alert, cooperative and appears stated age Ears: normal TM's and external ear canals both ears Throat: lips, mucosa, and tongue normal; teeth and gums normal Neck: no adenopathy, no carotid bruit, supple, symmetrical, trachea midline and thyroid not enlarged, symmetric, no tenderness/mass/nodules Back: symmetric, no curvature. ROM normal. No CVA tenderness. Lungs: clear to auscultation bilaterally Heart: regular rate and rhythm, S1, S2 normal, no murmur, click, rub or gallop Abdomen: soft, non-tender; bowel sounds normal; no masses,  no organomegaly Pulses: 2+ and symmetric Skin: Skin color, texture, turgor normal. No rashes or lesions Lymph nodes: Cervical, supraclavicular, and axillary nodes normal. Neuro:  awake and interactive with normal mood and affect. Higher cortical functions are normal. Speech is clear without word-finding difficulty or dysarthria. Extraocular movements are intact. Visual fields of both eyes are grossly intact. Sensation to light touch is grossly intact bilaterally of upper and lower extremities. Motor examination shows 4+/5 symmetric hand grip and upper extremity and 5/5 lower extremity strength.  There is no pronation or drift. Gait is non-ataxic   Lab Results  Component Value Date   HGBA1C 5.9 07/19/2016   HGBA1C 5.8 06/02/2015    Lab Results  Component Value Date   CREATININE 2.30 (H) 09/22/2022   CREATININE 1.48 (H) 04/14/2022   CREATININE 1.49 (H) 04/12/2022    Lab Results  Component Value Date   WBC 5.0 07/20/2022   HGB 10.9 (L) 09/22/2022   HCT 32.0 (L) 09/22/2022   PLT 286 07/20/2022   GLUCOSE 105 (H) 09/22/2022   CHOL 206 (H) 01/28/2022   TRIG 61 01/28/2022   HDL 68 01/28/2022   LDLDIRECT 109.0 03/05/2016   LDLCALC 123 (H) 01/28/2022   ALT 16 04/06/2022   AST 23 04/06/2022   NA 138 09/22/2022   K 4.3 09/22/2022   CL 105 09/22/2022   CREATININE 2.30 (H) 09/22/2022   BUN 34 (H) 09/22/2022   CO2 26 04/14/2022   TSH 7.67 (H) 06/22/2021   INR 1.3 (H)  03/31/2022   HGBA1C 5.9 07/19/2016    VAS US RENAL ARTERY DUPLEX  Result Date: 09/22/2022 ABDOMINAL VISCERAL Patient Name:  Cathalina A [facility]  Date of Exam:   09/22/2022 Medical Rec #: [id number]           Accession #:    [id number] Date of Birth: 1941/08/22           Patient Gender: F Patient Age:   11 years Exam Location:  [facility] Procedure:      VAS US RENAL ARTERY DUPLEX Referring Phys: [id number] [place] -------------------------------------------------------------------------------- Indications: Elevated creatinine. History of bilateral renal artery stents in              2015. High Risk Factors: Hypertension, hyperlipidemia, coronary artery disease. Other Factors: Chronic kidney disease stage III. Vascular Interventions: 03/30/22 - Right CFA re-do femoral endarterectomy with                         vein patch.                         03/17/22 Endarterectomy femoral (Bilateral SFA stents). Comparison Study: 12/23/21 - Renal - no renal artery stenosis. Mild dilitation of                   distal aorta 2.8 cm. Performing Technologist: Oda Cogan RDMS, RVT  Examination Guidelines: A complete  evaluation includes B-mode imaging, spectral Doppler, color Doppler, and power Doppler as needed of all accessible portions of each vessel. Bilateral testing is considered an integral part of a complete examination. Limited examinations for reoccurring indications may be performed as noted.  Duplex Findings: +--------------------+--------+--------+------+--------------------+ Mesenteric          PSV cm/sEDV cm/sPlaque      Comments       +--------------------+--------+--------+------+--------------------+ Aorta Mid              63                                      +--------------------+--------+--------+------+--------------------+ Aorta Distal                              2.7 x 2.5 dilitation +--------------------+--------+--------+------+--------------------+ Celiac Artery Origin  192                                      +--------------------+--------+--------+------+--------------------+ SMA Proximal          445                                      +--------------------+--------+--------+------+--------------------+ SMA Mid               208                                      +--------------------+--------+--------+------+--------------------+    +------------------+--------+--------+-------+ Right Renal ArteryPSV cm/sEDV cm/sComment +------------------+--------+--------+-------+ Origin              156      24           +------------------+--------+--------+-------+  Proximal            196      30           +------------------+--------+--------+-------+ Mid                 175      26           +------------------+--------+--------+-------+ Distal              113      18           +------------------+--------+--------+-------+ +-----------------+--------+--------+-------+ Left Renal ArteryPSV cm/sEDV cm/sComment +-----------------+--------+--------+-------+ Origin             110      18            +-----------------+--------+--------+-------+ Proximal           169      28           +-----------------+--------+--------+-------+ Mid                 62      16           +-----------------+--------+--------+-------+ Distal              82      21           +-----------------+--------+--------+-------+ +------------+--------+--------+----+-----------+--------+--------+----+ Right KidneyPSV cm/sEDV cm/sRI  Left KidneyPSV cm/sEDV cm/sRI   +------------+--------+--------+----+-----------+--------+--------+----+ Upper Pole  38      7       0.83Upper Pole 36      10      0.72 +------------+--------+--------+----+-----------+--------+--------+----+ Mid         29      8       0.71Mid        47      13      0.73 +------------+--------+--------+----+-----------+--------+--------+----+ Lower Pole  61      16      0.74Lower Pole 18      0       1.00 +------------+--------+--------+----+-----------+--------+--------+----+ Hilar       57      12      0.79Hilar      46      12      0.75 +------------+--------+--------+----+-----------+--------+--------+----+ +------------------+----+------------------+----+ Right Kidney          Left Kidney            +------------------+----+------------------+----+ RAR                   RAR                    +------------------+----+------------------+----+ RAR (manual)      3.1 RAR (manual)      2.6  +------------------+----+------------------+----+ Cortex                Cortex                 +------------------+----+------------------+----+ Cortex thickness      Corex thickness        +------------------+----+------------------+----+ Kidney length (cm)9.00Kidney length (cm)9.20 +------------------+----+------------------+----+  Summary: Renal:  Right: 1-59% stenosis of the right renal artery. Abnormal right        Resistive Index. Right kidney appears smaller in size when        compared to last  study done on 12/23/21. Left:  1-59% stenosis of the left renal artery. Abnormal left        Resisitve Index. Left kidney  appears smaller in size when        compared to last study done on 12/23/21. 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  *See table(s) above for measurements and observations.  Diagnosing physician: Harold Barban MD  Electronically signed by Harold Barban MD on 09/22/2022 at 7:48:43 PM.    Final     Assessment & Plan:   Problem List Items Addressed This Visit     Acute renal failure superimposed on chronic kidney disease ([place]) - Primary    Cause of her current decline in GFR is unclear.  Screening for MM ordered.  Will see nephrology this afternoon.        Relevant Orders   Renal function panel   Protein electrophoresis, serum   IFE AND PE, RANDOM URINE   CBC with Differential/Platelet   PTH, Intact and Calcium   Anemia of chronic kidney failure, stage 3 (moderate) ([facility]) (Chronic)    S/p hematology evaluation. Per last visit there was no indication for erythropoietin therapy. She asa advised to decrease  ferrous sulfate to every other day.        Relevant Medications   ferrous sulfate 325 (65 FE) MG tablet   Essential hypertension    No changes to regimen today as her regimen of losartan 50 mg daily, amlodipine 10 mg ,  labetalol 300 mg bid.       Lumbar stenosis with neurogenic claudication    No options remain except for PT to prevent further deconditoning.  At last visit I resumed prescription of opioids for pain management . Hydrocodone 10/325 given #28 tablets for first rx for 7 days.  She has been taking them once daily   will refill with directions to take 2-3 times daily .  For now.  Avoiding LA opioids given decrease in GFR       Relevant Medications   HYDROcodone-acetaminophen (NORCO) 10-325 MG tablet   Other Visit Diagnoses     Pruritus       Relevant Orders   Sedimentation rate   C-reactive protein       I spent a total  of  30 minutes with this patient in a face to face visit on the date of this encounter reviewing the last office visit with me in  October,   most recent visit with cardiology  and vascular surgery,  patient's diet and activity level,  home blood pressure readings,   and post visit ordering of testing and therapeutics.    Follow-up: No follow-ups on file.   Crecencio Mc, MD

## 2022-09-23 NOTE — Assessment & Plan Note (Signed)
No options remain except for PT to prevent further deconditoning.  At last visit I resumed prescription of opioids for pain management . Hydrocodone 10/325 given #28 tablets for first rx for 7 days.  She has been taking them once daily   will refill with directions to take 2-3 times daily .  For now.  Avoiding LA opioids given decrease in GFR

## 2022-09-23 NOTE — Assessment & Plan Note (Signed)
No changes to regimen today as her regimen of losartan 50 mg daily, amlodipine 10 mg ,  labetalol 300 mg bid.

## 2022-09-24 LAB — SPECIMEN STATUS REPORT

## 2022-09-27 ENCOUNTER — Telehealth: Payer: Self-pay | Admitting: Internal Medicine

## 2022-09-27 ENCOUNTER — Other Ambulatory Visit: Payer: Self-pay

## 2022-09-27 DIAGNOSIS — I739 Peripheral vascular disease, unspecified: Secondary | ICD-10-CM

## 2022-09-27 NOTE — Telephone Encounter (Signed)
Urine was sent to [facility] instead of [facility] by mistake. [facility] has Urine sample and stated they can have urine tested, and it will take a couple days for results.

## 2022-09-27 NOTE — Telephone Encounter (Signed)
-----   Message from Leeanne Rio, [place] sent at 09/26/2022  6:36 PM EST -----  ----- Message ----- From: Crecencio Mc, MD Sent: 09/24/2022   1:14 PM EST To: Leeanne Rio, CMA  There seems to be a problem with the URINE IFE .  They said no urine was collected; yet later on they say they have a urine with no order.  Can you look into this? It looks like there may be a problem with the SPEP as well ?

## 2022-09-28 LAB — IFE AND PE, RANDOM URINE

## 2022-09-28 LAB — SPECIMEN STATUS REPORT

## 2022-09-30 DIAGNOSIS — N2581 Secondary hyperparathyroidism of renal origin: Secondary | ICD-10-CM | POA: Diagnosis not present

## 2022-09-30 DIAGNOSIS — N179 Acute kidney failure, unspecified: Secondary | ICD-10-CM | POA: Diagnosis not present

## 2022-09-30 DIAGNOSIS — N184 Chronic kidney disease, stage 4 (severe): Secondary | ICD-10-CM | POA: Diagnosis not present

## 2022-09-30 DIAGNOSIS — I10 Essential (primary) hypertension: Secondary | ICD-10-CM | POA: Diagnosis not present

## 2022-10-06 ENCOUNTER — Other Ambulatory Visit: Payer: Self-pay

## 2022-10-06 ENCOUNTER — Ambulatory Visit (HOSPITAL_COMMUNITY)
Admission: RE | Disposition: A | Discharge status: Home / Self Care | Payer: Self-pay | Source: Ambulatory Visit | Attending: Vascular Surgery

## 2022-10-06 ENCOUNTER — Ambulatory Visit (HOSPITAL_COMMUNITY)
Admission: RE | Admit: 2022-10-06 | Discharge: 2022-10-06 | Disposition: A | Discharge status: Home / Self Care | Payer: Medicare Other | Source: Ambulatory Visit | Attending: Vascular Surgery | Admitting: Vascular Surgery

## 2022-10-06 ENCOUNTER — Ambulatory Visit (HOSPITAL_BASED_OUTPATIENT_CLINIC_OR_DEPARTMENT_OTHER): Payer: Medicare Other

## 2022-10-06 DIAGNOSIS — Z7982 Long term (current) use of aspirin: Secondary | ICD-10-CM | POA: Diagnosis not present

## 2022-10-06 DIAGNOSIS — I5032 Chronic diastolic (congestive) heart failure: Secondary | ICD-10-CM | POA: Insufficient documentation

## 2022-10-06 DIAGNOSIS — Z87891 Personal history of nicotine dependence: Secondary | ICD-10-CM | POA: Insufficient documentation

## 2022-10-06 DIAGNOSIS — Y832 Surgical operation with anastomosis, bypass or graft as the cause of abnormal reaction of the patient, or of later complication, without mention of misadventure at the time of the procedure: Secondary | ICD-10-CM | POA: Diagnosis not present

## 2022-10-06 DIAGNOSIS — I739 Peripheral vascular disease, unspecified: Secondary | ICD-10-CM | POA: Insufficient documentation

## 2022-10-06 DIAGNOSIS — T82856A Stenosis of peripheral vascular stent, initial encounter: Secondary | ICD-10-CM | POA: Diagnosis not present

## 2022-10-06 DIAGNOSIS — I6521 Occlusion and stenosis of right carotid artery: Secondary | ICD-10-CM | POA: Diagnosis not present

## 2022-10-06 DIAGNOSIS — I130 Hypertensive heart and chronic kidney disease with heart failure and stage 1 through stage 4 chronic kidney disease, or unspecified chronic kidney disease: Secondary | ICD-10-CM | POA: Insufficient documentation

## 2022-10-06 DIAGNOSIS — I701 Atherosclerosis of renal artery: Secondary | ICD-10-CM | POA: Diagnosis not present

## 2022-10-06 DIAGNOSIS — Z9889 Other specified postprocedural states: Secondary | ICD-10-CM

## 2022-10-06 DIAGNOSIS — Z7902 Long term (current) use of antithrombotics/antiplatelets: Secondary | ICD-10-CM | POA: Insufficient documentation

## 2022-10-06 DIAGNOSIS — N183 Chronic kidney disease, stage 3 unspecified: Secondary | ICD-10-CM | POA: Insufficient documentation

## 2022-10-06 DIAGNOSIS — I708 Atherosclerosis of other arteries: Secondary | ICD-10-CM | POA: Diagnosis not present

## 2022-10-06 DIAGNOSIS — T82898A Other specified complication of vascular prosthetic devices, implants and grafts, initial encounter: Secondary | ICD-10-CM | POA: Diagnosis not present

## 2022-10-06 HISTORY — PX: ABDOMINAL AORTOGRAM W/LOWER EXTREMITY: CATH118223

## 2022-10-06 HISTORY — PX: PERIPHERAL VASCULAR BALLOON ANGIOPLASTY: CATH118281

## 2022-10-06 LAB — PROTEIN ELECTROPHORESIS, SERUM
Albumin ELP: 4 g/dL (ref 3.8–4.8)
Alpha 1: 0.3 g/dL (ref 0.2–0.3)
Alpha 2: 1 g/dL — ABNORMAL HIGH (ref 0.5–0.9)
Beta 2: 0.3 g/dL (ref 0.2–0.5)
Beta Globulin: 0.5 g/dL (ref 0.4–0.6)
Gamma Globulin: 1.2 g/dL (ref 0.8–1.7)
Total Protein: 7.2 g/dL (ref 6.1–8.1)

## 2022-10-06 LAB — POCT I-STAT, CHEM 8
BUN: 30 mg/dL — ABNORMAL HIGH (ref 8–23)
Calcium, Ion: 1.33 mmol/L (ref 1.15–1.40)
Chloride: 104 mmol/L (ref 98–111)
Creatinine, Ser: 1.9 mg/dL — ABNORMAL HIGH (ref 0.44–1.00)
Glucose, Bld: 94 mg/dL (ref 70–99)
HCT: 30 % — ABNORMAL LOW (ref 36.0–46.0)
Hemoglobin: 10.2 g/dL — ABNORMAL LOW (ref 12.0–15.0)
Potassium: 4.2 mmol/L (ref 3.5–5.1)
Sodium: 139 mmol/L (ref 135–145)
TCO2: 24 mmol/L (ref 22–32)

## 2022-10-06 LAB — TEST AUTHORIZATION

## 2022-10-06 LAB — PROTEIN ELECTROPHORESIS,W/TOTAL PROTEIN AND RFLX TO IFE,URINE
Albumin: 64 %
Alpha-1-Globulin, U: 2 %
Alpha-2-Globulin, U: 9 %
Beta Globulin, U: 15 %
Gamma Globulin, U: 10 %
PROTEIN/CREATININE RATIO: 0.743 mg/mg creat — ABNORMAL HIGH (ref <0.150)
PROTEIN/CREATININE RATIO: 743 mg/g creat — ABNORMAL HIGH (ref <150)
Protein, 24H Urine: 113 mg/dL

## 2022-10-06 LAB — PTH, INTACT AND CALCIUM
Calcium: 9.6 mg/dL (ref 8.6–10.4)
PTH: 94 pg/mL — ABNORMAL HIGH (ref 16–77)

## 2022-10-06 LAB — POCT ACTIVATED CLOTTING TIME
Activated Clotting Time: 239 seconds
Activated Clotting Time: 217 seconds
Activated Clotting Time: 195 seconds

## 2022-10-06 LAB — EXTRA SPECIMEN

## 2022-10-06 LAB — EXTRA URINE SPECIMEN

## 2022-10-06 SURGERY — ABDOMINAL AORTOGRAM W/LOWER EXTREMITY
Anesthesia: LOCAL

## 2022-10-06 MED ORDER — LABETALOL HCL 5 MG/ML IV SOLN
INTRAVENOUS | Status: AC
  Filled 2022-10-06: qty 4

## 2022-10-06 MED ORDER — SODIUM CHLORIDE 0.9 % IV SOLN: 250.0000 mL | INTRAVENOUS | Status: DC | PRN

## 2022-10-06 MED ORDER — IODIXANOL 320 MG/ML IV SOLN
INTRAVENOUS | Status: DC | PRN
  Administered 2022-10-06: 100 mL

## 2022-10-06 MED ORDER — HEPARIN (PORCINE) IN NACL 1000-0.9 UT/500ML-% IV SOLN
INTRAVENOUS | Status: AC
  Filled 2022-10-06: qty 1000

## 2022-10-06 MED ORDER — MIDAZOLAM HCL 2 MG/2ML IJ SOLN
INTRAMUSCULAR | Status: DC | PRN
  Administered 2022-10-06 (×2): 1 mg via INTRAVENOUS

## 2022-10-06 MED ORDER — ACETAMINOPHEN 325 MG PO TABS
ORAL_TABLET | ORAL | Status: AC
  Filled 2022-10-06: qty 2

## 2022-10-06 MED ORDER — SODIUM CHLORIDE 0.9% FLUSH: 3.0000 mL | Freq: Two times a day (BID) | INTRAVENOUS | Status: DC

## 2022-10-06 MED ORDER — HEPARIN (PORCINE) IN NACL 1000-0.9 UT/500ML-% IV SOLN
INTRAVENOUS | Status: DC | PRN
  Administered 2022-10-06 (×2): 500 mL

## 2022-10-06 MED ORDER — LIDOCAINE HCL (PF) 1 % IJ SOLN
INTRAMUSCULAR | Status: DC | PRN
  Administered 2022-10-06: 5 mL via SUBCUTANEOUS

## 2022-10-06 MED ORDER — PROTAMINE SULFATE 10 MG/ML IV SOLN
50.0000 mg | Freq: Once | INTRAVENOUS | Status: AC
  Administered 2022-10-06: 30 mg via INTRAVENOUS

## 2022-10-06 MED ORDER — PROTAMINE SULFATE 10 MG/ML IV SOLN
INTRAVENOUS | Status: AC
  Filled 2022-10-06: qty 5

## 2022-10-06 MED ORDER — HEPARIN SODIUM (PORCINE) 1000 UNIT/ML IJ SOLN
INTRAMUSCULAR | Status: AC
  Filled 2022-10-06: qty 10

## 2022-10-06 MED ORDER — LABETALOL HCL 5 MG/ML IV SOLN
10.0000 mg | INTRAVENOUS | Status: DC | PRN
  Administered 2022-10-06: 10 mg via INTRAVENOUS

## 2022-10-06 MED ORDER — FENTANYL CITRATE (PF) 100 MCG/2ML IJ SOLN
INTRAMUSCULAR | Status: AC
  Filled 2022-10-06: qty 2

## 2022-10-06 MED ORDER — HEPARIN SODIUM (PORCINE) 1000 UNIT/ML IJ SOLN
INTRAMUSCULAR | Status: DC | PRN
  Administered 2022-10-06: 2000 [IU] via INTRAVENOUS
  Administered 2022-10-06: 5000 [IU] via INTRAVENOUS
  Administered 2022-10-06: 2000 [IU] via INTRAVENOUS

## 2022-10-06 MED ORDER — FENTANYL CITRATE (PF) 100 MCG/2ML IJ SOLN
INTRAMUSCULAR | Status: DC | PRN
  Administered 2022-10-06: 50 ug via INTRAVENOUS
  Administered 2022-10-06: 50 ug
  Administered 2022-10-06 (×2): 50 ug via INTRAVENOUS

## 2022-10-06 MED ORDER — MIDAZOLAM HCL 2 MG/2ML IJ SOLN
INTRAMUSCULAR | Status: AC
  Filled 2022-10-06: qty 2

## 2022-10-06 MED ORDER — SODIUM CHLORIDE 0.9 % WEIGHT BASED INFUSION: 1.0000 mL/kg/h | INTRAVENOUS | Status: DC

## 2022-10-06 MED ORDER — LIDOCAINE HCL (PF) 1 % IJ SOLN
INTRAMUSCULAR | Status: AC
  Filled 2022-10-06: qty 30

## 2022-10-06 MED ORDER — HYDRALAZINE HCL 20 MG/ML IJ SOLN: 5.0000 mg | INTRAMUSCULAR | Status: DC | PRN

## 2022-10-06 MED ORDER — ONDANSETRON HCL 4 MG/2ML IJ SOLN: 4.0000 mg | Freq: Four times a day (QID) | INTRAMUSCULAR | Status: DC | PRN

## 2022-10-06 MED ORDER — SODIUM CHLORIDE 0.9 % IV SOLN: INTRAVENOUS | Status: DC

## 2022-10-06 MED ORDER — ACETAMINOPHEN 325 MG PO TABS
650.0000 mg | ORAL_TABLET | ORAL | Status: DC | PRN
  Administered 2022-10-06: 650 mg via ORAL

## 2022-10-06 MED ORDER — SODIUM CHLORIDE 0.9% FLUSH: 3.0000 mL | INTRAVENOUS | Status: DC | PRN

## 2022-10-06 SURGICAL SUPPLY — 30 items
BALLN IN.PACT DCB 6X40 (BALLOONS) ×2
BALLN MUSTANG 5X20X135 (BALLOONS) ×2
BALLN VIATRAC 4X20X135 (BALLOONS) ×2
BALLOON MUSTANG 5X20X135 (BALLOONS) IMPLANT
BALLOON VIATRAC 4X20X135 (BALLOONS) IMPLANT
CATH INFINITI JR4 5F (CATHETERS) IMPLANT
CATH NAVICROSS ANG 65CM (CATHETERS) IMPLANT
CATH NAVICROSS ANGLED 90CM (MICROCATHETER) IMPLANT
CATH OMNI FLUSH 5F 65CM (CATHETERS) IMPLANT
CATHETER NAVICROSS ANG 65CM (CATHETERS) ×2
DCB IN.PACT 6X40 (BALLOONS) IMPLANT
DEVICE TORQUE .025-.038 (MISCELLANEOUS) IMPLANT
GLIDEWIRE ADV .035X180CM (WIRE) IMPLANT
GUIDE CATH VISTA IMA 6F (CATHETERS) IMPLANT
GUIDE CATH VISTA RDC 6F (CATHETERS) IMPLANT
GUIDEWIRE ANGLED .035X260CM (WIRE) IMPLANT
KIT ENCORE 26 ADVANTAGE (KITS) IMPLANT
KIT MICROPUNCTURE NIT STIFF (SHEATH) IMPLANT
KIT PV (KITS) ×3 IMPLANT
SHEATH DESTIN RDC 6FR 45 (SHEATH) IMPLANT
SHEATH PINNACLE 5F 10CM (SHEATH) IMPLANT
SHEATH PINNACLE 6F 10CM (SHEATH) IMPLANT
SHEATH PROBE COVER 6X72 (BAG) IMPLANT
SYR MEDRAD MARK V 150ML (SYRINGE) IMPLANT
TRANSDUCER W/STOPCOCK (MISCELLANEOUS) ×3 IMPLANT
TRAY PV CATH (CUSTOM PROCEDURE TRAY) ×3 IMPLANT
WIRE BENTSON .035X145CM (WIRE) IMPLANT
WIRE ROSEN-J .035X180CM (WIRE) IMPLANT
WIRE ROSEN-J .035X260CM (WIRE) IMPLANT
WIRE STABILIZER XS .014X180CM (WIRE) IMPLANT

## 2022-10-06 NOTE — Progress Notes (Signed)
Site area: Left groin a 6 french arterial sheath was removed by Dr Virl Cagey  Site Prior to Removal:  Level 0  Pressure Applied For 20 MINUTES    Bedrest Beginning at 1400pm X 4 hours  Manual:   Yes.    Patient Status During Pull:  stable  Post Pull Groin Site:  Level 0  Post Pull Instructions Given:  Yes.    Post Pull Pulses Present:  Yes.    Dressing Applied:  Yes.    Comments:

## 2022-10-06 NOTE — Progress Notes (Signed)
Limited renal duplex  has been completed. Refer to [facility] under chart review to view preliminary results.   10/06/2022  4:24 PM Saralyn Willison, Bonnye Fava

## 2022-10-06 NOTE — H&P (Signed)
Patient seen and examined in preop holding.  No complaints. No changes to medication history or physical exam since last seen. Angiogram was canceled 2 weeks ago due to elevated creatinine.  This is improved.  Still slightly over baseline at 1.9.  My plan today is to assess inflow due to multiple stents in bilateral iliac arteries.  I do not plan to evaluate her runoff in an effort to keep contrast volume low. After discussing the risks and benefits of aortogram with possible intervention for assisted primary patency of bilateral iliac artery stents, Sabrina [name] elected to proceed.   Broadus John MD    HPI: Sabrina [name] is a 81 y.o. (02/20/1941) female presenting in follow up s/p right groin exploration, redo CFA patch plasty with ipsilateral GSV on 03/30/22.  Prior surgeries were performed at [facility] and include: Right renal artery stenting 6mm left renal artery stenting (59mm) (2015) Left subclavian artery stenting 68mm (2023) Bilateral common iliac artery stenting Bilateral external iliac stenting Left common femoral, profunda femoris, and superficial femoral artery endarterectomies Right common femoral, profunda femoris, and superficial femoral artery endarterectomies Stent placement to left external iliac artery with 8 mm diameter by 4 cm length of life star stent Right ICA occlusion.  On exam today, Sabrina [name] was doing well, accompanied by friend.  She denied claudication, rest pain, ischemic tissue loss.  She is living independently at home with home nursing visiting twice a week.    Since her last appointment, she has been seen by [facility] and had staples removed from her left groin.   The pt is  on a daily aspirin.   Other AC:  plavix The pt is  on medication for hypertension.   The pt is not diabetic.  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 b.) TTE 11/16/2021: EF >55%; mild AS (MPG 9 mmHg)   CAD (coronary artery disease)    a.) s/p 3v CABG 03/29/2000   Cardiac arrest [facility])    Carotid artery stenosis    a.) s/p LEFT CEA 09/09/2003. b.) Carotid doppler 20/25/4270: 6-23% LICA, CTO RICA; subclavian stenosis   Cataracts, bilateral    Cervical spondylosis without myelopathy    Chronic diastolic CHF (congestive heart failure), NYHA class 3 (HCC)    a.) TTE 05/27/2016: EF >55%, mild LA enlargement, triv PR, mild MR, mod TR; G3DD. b.) TTE 12/12/2017: EF >55%, mild LVH, BAE, mild MR/PR, mod TR; RVSP 52.8 mmHg. c.) TTE 03/18/2020: EF >55%, BAD, AS (MPG 8.7 mmHg); triv MR, mild TR/PR. d.) TTE 11/16/2021: EF >55%, LVH, G1DD, triv MR, mild PR, mod TR; AS (MPG 9 mmHg); MS (MPG 5 mmHg)   Chronic kidney disease, stage III (moderate) (HCC)    Chronic narcotic use 06/24/2014   Chronic pain syndrome    GERD (gastroesophageal reflux disease)    History of 2019 novel coronavirus disease (COVID-19) 09/22/2021   Hyperlipidemia    Hypertension    Long term current use of antithrombotics/antiplatelets    a.) on daily DAPT therapy (ASA + clopidogrel)  Lumbar stenosis with neurogenic claudication    Mitral stenosis 11/16/2021   a.) TTE 11/16/2021: EF >55%; mod MS (MPG 5 mmHg)   Osteoarthritis of hip    Postoperative wound infection 03/29/2022   Pulmonary hypertension ([place]) 12/12/2017   a.) TTE 12/12/2017: mild; RVSP 52.8 mmHg   PVD (peripheral vascular disease) (HCC)    Renal artery stenosis (HCC)    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    Past Surgical History:  Procedure Laterality Date   ABDOMINAL HYSTERECTOMY  1976   CAROTID ARTERY ANGIOPLASTY Left    CAROTID ENDARTERECTOMY Left 09/09/2003   Procedure: CAROTID ENDARTERECTOMY; Location: [facility]; Surgeon: Maura Crandall, MD   COLONOSCOPY WITH PROPOFOL N/A 08/16/2017   Procedure: COLONOSCOPY WITH PROPOFOL;  Surgeon: [name], MD;  Location: [facility];  Service: Endoscopy;  Laterality: N/A;   CORONARY ANGIOPLASTY WITH STENT PLACEMENT  2000   CORONARY ARTERY BYPASS GRAFT N/A 03/29/2000   Procedure: 3v CORONARY ARTERY BYPASS GRAFT; Location: [facility]   CYSTOSCOPY WITH STENT PLACEMENT Bilateral    ENDARTERECTOMY FEMORAL Bilateral 03/17/2022   Procedure: ENDARTERECTOMY FEMORAL ( BILATERAL SFA STENT);  Surgeon: Katha Cabal, MD;  Location: [facility] ORS;  Service: Vascular;  Laterality: Bilateral;   ENDARTERECTOMY FEMORAL Right 03/30/2022   Procedure: RE-EXPOSURE OF RIGHT COMMON FEMORAL ARTERY, RE-DO RIGHT FEMORAL ENDARTERECTOMY WITH VEIN PATCH;  Surgeon: Broadus John, MD;  Location: [facility] OR;  Service: Vascular;  Laterality: Right;  WOUND VAC   ESOPHAGOGASTRODUODENOSCOPY (EGD) WITH PROPOFOL N/A 08/16/2017   Procedure: ESOPHAGOGASTRODUODENOSCOPY (EGD) WITH PROPOFOL;  Surgeon: [name], MD;  Location: [facility];  Service: Endoscopy;  Laterality: N/A;   ESOPHAGOGASTRODUODENOSCOPY (EGD) WITH PROPOFOL N/A 06/29/2018   Procedure: ESOPHAGOGASTRODUODENOSCOPY (EGD) WITH PROPOFOL;  Surgeon: Virgel Manifold, MD;  Location: [facility];  Service: Endoscopy;  Laterality: N/A;   GROIN DEBRIDEMENT Right 03/30/2022   Procedure: [place];  Surgeon: Broadus John, MD;  Location: [place];  Service: Vascular;  Laterality: Right;   INSERTION OF ILIAC STENT Left 03/17/2022   Procedure: INSERTION OF ILIAC STENT;  Surgeon:  Katha Cabal, MD;  Location: [facility];  Service: Vascular;  Laterality: Left;   LAPAROSCOPIC CHOLECYSTECTOMY Left 10/26/1999   Procedure: LAPAROSCOPIC CHOLECYSTECTOMY; Location: [facility]; Surgeon: Rochel Brome, MD   LEFT HEART CATH AND CORONARY ANGIOGRAPHY N/A 11/18/2021   Procedure: LEFT HEART CATH AND CORONARY ANGIOGRAPHY;  Surgeon: Yolonda Kida, MD;  Location: [place] [facility];  Service: Cardiovascular;  Laterality: N/A;   LEFT HEART CATH AND CORS/GRAFTS ANGIOGRAPHY Left 11/19/2002   Procedure: LEFT HEART CATH AND CORS/GRAFTS ANGIOGRAPHY; Location: [place]; Surgeon: Katrine Coho, MD   LEFT HEART CATH AND CORS/GRAFTS ANGIOGRAPHY Left 09/17/2003   Procedure: LEFT HEART CATH AND CORS/GRAFTS ANGIOGRAPHY; Location: [place]; Surgeon: Katrine Coho, MD   LOWER EXTREMITY ANGIOGRAPHY Right 01/06/2022   Procedure: Lower Extremity Angiography;  Surgeon: Katha Cabal, MD;  Location: [place] [facility];  Service: Cardiovascular;  Laterality: Right;   RENAL ARTERY ANGIOPLASTY Bilateral 12/2013   TOE AMPUTATION Right    small toe   TONSILLECTOMY AND ADENOIDECTOMY     TOTAL HIP ARTHROPLASTY Left 2005   TOTAL HIP ARTHROPLASTY Right 2015   UPPER EXTREMITY ANGIOGRAPHY Left 11/23/2021   Procedure: UPPER EXTREMITY  ANGIOGRAPHY;  Surgeon: Algernon Huxley, MD;  Location: [place] [facility];  Service: Cardiovascular;  Laterality: Left;    Social History   Socioeconomic History   Marital status: Divorced    Spouse name: Not on file   Number of children: 3   Years of education: Not on file   Highest education level: Not on file  Occupational History   Occupation: retired  Tobacco Use   Smoking status: Former    Types: Cigarettes    Quit date: 10/25/1998    Years since quitting: 23.9   Smokeless tobacco: Never  Vaping Use   Vaping Use: Never used  Substance and Sexual Activity   Alcohol use: No    Alcohol/week: 0.0 standard drinks of alcohol   Drug use: Never   Sexual activity: Not  Currently  Other Topics Concern   Not on file  Social History Narrative   Daughter [name] ([place]); 1 in [employer]; 1 in [place] alone   Social Determinants of Health   Financial Resource Strain: [place]  (09/22/2017)   Overall Financial Resource Strain (CARDIA)    Difficulty of Paying Living Expenses: Not hard at all  Food Insecurity: No Food Insecurity (08/23/2022)   Hunger Vital Sign    Worried About Running Out of Food in the Last Year: Never true    [place] in the Last Year: Never true  Transportation Needs: No Transportation Needs (08/23/2022)   PRAPARE - Hydrologist (Medical): No    Lack of Transportation (Non-Medical): No  Physical Activity: Inactive (09/22/2017)   Exercise Vital Sign    Days of Exercise per Week: 0 days    Minutes of Exercise per Session: 0 min  Stress: Unknown (09/22/2017)   [place]    Feeling of Stress : Patient refused  Social Connections: Unknown (09/22/2017)   Social Connection and Isolation Panel [NHANES]    Frequency of Communication with Friends and Family: Patient refused    Frequency of Social Gatherings with Friends and Family: Patient refused    Attends Religious Services: Patient refused    Active Member of Clubs or Organizations: Patient refused    Attends Archivist Meetings: Patient refused    Marital Status: Patient refused  Intimate Partner Violence: Not At Risk (09/22/2017)   Humiliation, Afraid, Rape, and Kick questionnaire    Fear of Current or Ex-Partner: No    Emotionally Abused: No    Physically Abused: No    Sexually Abused: No    Family History  Problem Relation Age of Onset   Stroke Mother    Hypertension Mother    Diabetes Mother    Hypertension Father    Heart disease Sister        MI   Multiple sclerosis Daughter    Multiple sclerosis Son    Cerebral aneurysm Son    Seizures Son     Cerebral aneurysm Son    Breast cancer Paternal Aunt 58    Current Facility-Administered Medications  Medication Dose Route Frequency Provider Last Rate Last Admin   0.9 %  sodium chloride infusion   Intravenous Continuous Broadus John, MD 100 mL/hr at 10/06/22 0626 New Bag at 10/06/22 0626    Allergies  Allergen Reactions   Citalopram Anaphylaxis    Throat closing    Dilaudid [Hydromorphone Hcl] Nausea And Vomiting   Hydrochlorothiazide Other (See Comments)  Decreased GFR (Nov 2015)   Liothyronine     Hair fell out, caused headaches    Nsaids     CKD stage III - avoid nephrotoxic drugs   Nubain [Nalbuphine Hcl]     Burning sensation in back   Penicillins Itching   Prasugrel Itching   Statins Itching     REVIEW OF SYSTEMS:   [X]  denotes positive finding, [ ]  denotes negative finding Cardiac  Comments:  Chest pain or chest pressure:    Shortness of breath upon exertion:    Short of breath when lying flat:    Irregular heart rhythm:        Vascular    Pain in calf, thigh, or hip brought on by ambulation:    Pain in feet at night that wakes you up from your sleep:     Blood clot in your veins:    Leg swelling:         Pulmonary    Oxygen at home:    Productive cough:     Wheezing:         Neurologic    Sudden weakness in arms or legs:     Sudden numbness in arms or legs:     Sudden onset of difficulty speaking or slurred speech:    Temporary loss of vision in one eye:     Problems with dizziness:         Gastrointestinal    Blood in stool:     Vomited blood:         Genitourinary    Burning when urinating:     Blood in urine:        Psychiatric    Major depression:         Hematologic    Bleeding problems:    Problems with blood clotting too easily:        Skin    Rashes or ulcers:        Constitutional    Fever or chills:      PHYSICAL EXAMINATION:  Vitals:   10/06/22 0618  BP: (!) 159/61  Pulse: 75  Temp: 98 F (36.7 C)  SpO2:  99%  Weight: 72.6 kg  Height: 5\' 1"  (1.549 m)    General:  WDWN in NAD; vital signs documented above Gait: Not observed HENT: WNL, normocephalic Pulmonary: normal non-labored breathing , without wheezing Cardiac: regular HR, Abdomen: soft, NT, no masses Skin: without rashes Vascular Exam/Pulses:  Right Left  Radial 2+ (normal) 2+ (normal)  Ulnar 2+ (normal) 2+ (normal)  Femoral    Popliteal    DP nonpalpable nonpalpable  PT nonpalpable nonpalpable   Extremities: without ischemic changes, without Gangrene , without cellulitis; without open wounds;  Right groin wound with 3 small areas opened, probe less than 1 cm, Musculoskeletal: no muscle wasting or atrophy  Neurologic: A&O X 3;  No focal weakness or paresthesias are detected Psychiatric:  The pt has Normal affect.   Non-Invasive Vascular Imaging:    +-------+-----------+-----------+------------+------------+  ABI/TBIToday's ABIToday's TBIPrevious ABIPrevious TBI  +-------+-----------+-----------+------------+------------+  Right 0.53       0.34       0.57        0.44          +-------+-----------+-----------+------------+------------+  Left  0.63       0.38       0.56        0.37          +-------+-----------+-----------+------------+------------+   Abdominal Aorta  Findings:  +-------------+-------+----------+----------+--------+--------+--------+  Location    AP (cm)Trans (cm)PSV (cm/s)WaveformThrombusComments  +-------------+-------+----------+----------+--------+--------+--------+  RT EIA Distal                 538                                 +-------------+-------+----------+----------+--------+--------+--------+  LT CIA Distal                 260                                 +-------------+-------+----------+----------+--------+--------+--------+   Visualization of the Left CIA Proximal artery and Left CIA Mid artery was  limited.   Left Stent(s):   +---------------+--------+---------------+--------+--------+  EIA           PSV cm/sStenosis       WaveformComments  +---------------+--------+---------------+--------+--------+  Prox to Stent  213                                      +---------------+--------+---------------+--------+--------+  Proximal Stent 181                                      +---------------+--------+---------------+--------+--------+  Mid Stent      291     50-99% stenosis                  +---------------+--------+---------------+--------+--------+  Distal Stent   252     1-49% stenosis                   +---------------+--------+---------------+--------+--------+  Distal to Stent157                                      +---------------+--------+---------------+--------+--------+   Summary:  Stenosis: +--------------------+-------------+---------------+  Location            Stenosis     Stent            +--------------------+-------------+---------------+  Right External Iliac>50% stenosis                 +--------------------+-------------+---------------+  Left External Iliac              50-99% stenosis  +--------------------+-------------+---------------+    ASSESSMENT/PLAN: Maham A Whalin is a 81 y.o. female presenting in follow up s/p right groin exploration, redo CFA patch plasty with ipsilateral GSV.    Adreanna has undergone multiple lower extremity operations-see above.  Ultrasound imaging today demonstrated elevated velocities at both the right external and left external arteries.  The left external artery has a previously placed stent.  I have concern for in-stent restenosis and discussed bilateral lower extremity angiography to define, and possibly treat areas of inflow stenosis for assisted primary patency, iliac artery stents, left-sided external iliac stent.  After discussing the risk and benefits, Kimanh elected to proceed.  I think the  safest access for diagnostic angiogram with possible intervention is the left common femoral artery.  I asked that she continue her current medication regimen. Gladyse has multiple vascular beds, including the left subclavian, bilateral common iliac arteries, left external iliac artery, bilateral renal arteries. Also right  ICA occlusion. At the time of her next visit, she will need carotid duplex ultrasound, bilateral renal artery ultrasound, aortoiliac duplex, ABIs   Broadus John, MD [facility] [phone]

## 2022-10-06 NOTE — Op Note (Signed)
Patient name: Sabrina [name] MRN: [id number] DOB: 10/08/1941 Sex: female  10/06/2022 Pre-operative Diagnosis: Peripheral arterial disease with elevated stent velocities. Post-operative diagnosis:  Same Surgeon:  Broadus John, MD Procedure Performed: 1.  Ultrasound-guided micropuncture access to the left common femoral artery 2.  Aortogram 3.  Right external iliac drug-coated balloon angioplasty 6 x 40 mm 4.  Bilateral selective renal artery angiogram 5.  Left renal artery balloon angioplasty 6 x 20 mm 6.  Right renal artery angioplasty 4 x 20 mm 7.  Contrast volume 100 mL 8.  Sedation 109 minutes   Indications: Patient is an 81 year old female with peripheral arterial disease and multiple vascular beds.  She is known to my practice but has had all of her endovascular work done at [name].  This includes bilateral renal artery stenting, BL common iliac artery stenting, L external iliac artery stenting, left subclavian artery stenting.  Recent ultrasound demonstrated elevated velocities through her external iliac artery stents.  After discussing the risk and benefits of aortogram with possible intervention to define and possibly improve blood flow for primary assisted patency of previously placed stents, [name] elected to proceed.  Findings:  Aortogram: 60% in-stent stenosis of the right renal artery, 70% in-stent stenosis of the left renal artery Dilated infrarenal abdominal aorta.  Bilateral common iliac artery stents widely patent, left-sided external iliac stent widely patent.  Focal, 70% stenosis of the distal external iliac   Procedure:  The patient was identified in the holding area and taken to room 8.  The patient was then placed supine on the table and prepped and draped in the usual sterile fashion.  A time out was called.  Ultrasound was used to evaluate the left common femoral artery.  It was patent .  A digital ultrasound image was acquired.  A micropuncture needle was  used to access the left common femoral artery under ultrasound guidance.  An 018 wire was advanced without resistance and a micropuncture sheath was placed.  The 018 wire was removed and a benson wire was placed.  The micropuncture sheath was exchanged for a 5 french sheath.  An omniflush catheter was advanced over the wire to the level of L-1.  An abdominal angiogram was obtained..  See findings above.  I elected to intervene on the right distal external iliac artery, as well as the bilateral renal arteries as the patient's creatinine remains elevated from its baseline.  An Omni Flush catheter was used to navigate the artery bifurcation and a Rosen wire was parked in the right profunda.  Next, a 6 [place] by 45 cm sheath was parked in the proximal external iliac artery.  Patient was heparinized with 5000 units IV heparin.  A 6 x 40 drug-coated balloon was brought onto the field and for 3 minutes in the distal external iliac artery across the flow-limiting lesion.  Follow-up angiography demonstrated excellent result with resolution of the stenosis.  Next, my attention turned to bilateral renal arteries.  Using a series of wires and catheters, I was able to cannulate the left renal artery.  There was in-stent stenosis measuring 70%.  I elected to treat the previously placed 4 mm stent with a 4 mm x 20 mm balloon which was inflated for 2 minutes.  Follow-up angiography demonstrated excellent result with resolution of flow-limiting stenosis.  Next, my attention turned to the left renal artery.  Angiography demonstrated 60% stenosis.  Being that the patient continues to have elevated creatinine, I elected to treat  this lesion with a 5 mm x 20 mm balloon.  This is inflated for 2 minutes.  Follow-up angiography demonstrated excellent result with resolution of the stenosis.  I performed another diagnostic angiogram of the terminal aorta to ensure the common neck artery stents were not affected by the intervention.  They  were not.  There was excellent flow distally.   Impression: Successful balloon angioplasty with resolution of flow-limiting stenosis in bilateral renal arteries 5 mm right 20 mm right, 4 mm x 20 mm left Successful drug-coated balloon angioplasty with resolution of flow-limiting stenosis in the right distal external iliac artery 6 x 40 mm DCB     [name], MD [facility] [place] Office: [phone]

## 2022-10-06 NOTE — Progress Notes (Signed)
Patient and daughter was given discharge instructions. Both verbalized understanding. 

## 2022-10-06 NOTE — Progress Notes (Signed)
Took report from off-going nurse Adonis Huguenin, RN

## 2022-10-07 ENCOUNTER — Encounter (HOSPITAL_COMMUNITY): Payer: Self-pay | Admitting: Vascular Surgery

## 2022-10-07 MED FILL — Fentanyl Citrate Preservative Free (PF) Inj 100 MCG/2ML: INTRAMUSCULAR | Qty: 2 | Status: AC

## 2022-10-14 ENCOUNTER — Ambulatory Visit: Payer: Self-pay

## 2022-10-14 NOTE — Patient Outreach (Signed)
  Care Coordination   10/14/2022 Name: Sabrina [name] MRN: [id number] DOB: 1941-01-07   Care Coordination Outreach Attempts:  An unsuccessful telephone outreach was attempted for a scheduled appointment today. Unable to reach patient or leave voice message due to mailbox being full.   Follow Up Plan:  Additional outreach attempts will be made to offer the patient care coordination information and services.   Encounter Outcome:  No Answer   Care Coordination Interventions:  No, not indicated    Quinn Plowman [facility] [place] [phone] direct line

## 2022-10-20 ENCOUNTER — Telehealth: Payer: Self-pay | Admitting: Vascular Surgery

## 2022-10-20 NOTE — Telephone Encounter (Signed)
Sabrina John, MD  [facility]; Sabrina [name]; [place] [facility] Charge [facility]     Patient name: Sabrina [name] MRN: [id number] DOB: [birth year] Sex: female  10/06/2022 Pre-operative Diagnosis: Peripheral arterial disease with elevated stent velocities. Post-operative diagnosis:  Same Surgeon:  Sabrina John, MD Procedure Performed: 1.  Ultrasound-guided micropuncture access to the left common femoral artery 2.  Aortogram 3.  Right external iliac drug-coated balloon angioplasty 6 x 40 mm 4.  Bilateral selective renal artery angiogram 5.  Left renal artery balloon angioplasty 6 x 20 mm 6.  Right renal artery angioplasty 4 x 20 mm 7.  Contrast volume 100 mL 8.  Sedation 109 minutes   Indications: Patient is an 81 year old female with peripheral arterial disease and multiple vascular beds.  She is known to my practice but has had all of her endovascular work done at [name].  This includes bilateral renal artery stenting, BL common iliac artery stenting, L external iliac artery stenting, left subclavian artery stenting.  Recent ultrasound demonstrated elevated velocities through her external iliac artery stents.  After discussing the risk and benefits of aortogram with possible intervention to define and possibly improve blood flow for primary assisted patency of previously placed stents, Sabrina [name] elected to proceed.  Findings: Aortogram: 60% in-stent stenosis of the right renal artery, 70% in-stent stenosis of the left renal artery Dilated infrarenal abdominal aorta.  Bilateral common iliac artery stents widely patent, left-sided external iliac stent widely patent.  Focal, 70% stenosis of the distal external iliac  Procedure:  The patient was identified in the holding area and taken to room 8.  The patient was then placed supine on the table and prepped and draped in the usual sterile fashion.  A time out was called.  Ultrasound was used to evaluate the left common femoral artery.  It  was patent .  A digital ultrasound image was acquired.  A micropuncture needle was used to access the left common femoral artery under ultrasound guidance.  An 018 wire was advanced without resistance and a micropuncture sheath was placed.  The 018 wire was removed and a benson wire was placed.  The micropuncture sheath was exchanged for a 5 french sheath.  An omniflush catheter was advanced over the wire to the level of L-1.  An abdominal angiogram was obtained..  See findings above.  I elected to intervene on the right distal external iliac artery, as well as the bilateral renal arteries as the patient's creatinine remains elevated from its baseline.  An Omni Flush catheter was used to navigate the artery bifurcation and a Rosen wire was parked in the right profunda.  Next, a 6 [place] by 45 cm sheath was parked in the proximal external iliac artery.  Patient was heparinized with 5000 units IV heparin.  A 6 x 40 drug-coated balloon was brought onto the field and for 3 minutes in the distal external iliac artery across the flow-limiting lesion.  Follow-up angiography demonstrated excellent result with resolution of the stenosis.  Next, my attention turned to bilateral renal arteries.  Using a series of wires and catheters, I was able to cannulate the left renal artery.  There was in-stent stenosis measuring 70%.  I elected to treat the previously placed 4 mm stent with a 4 mm x 20 mm balloon which was inflated for 2 minutes.  Follow-up angiography demonstrated excellent result with resolution of flow-limiting stenosis.  Next, my attention turned to the left renal artery.  Angiography demonstrated  60% stenosis.  Being that the patient continues to have elevated creatinine, I elected to treat this lesion with a 5 mm x 20 mm balloon.  This is inflated for 2 minutes.  Follow-up angiography demonstrated excellent result with resolution of the stenosis.  I performed another diagnostic angiogram of the terminal aorta  to ensure the common neck artery stents were not affected by the intervention.  They were not.  There was excellent flow distally.   Impression: Successful balloon angioplasty with resolution of flow-limiting stenosis in bilateral renal arteries 5 mm right 20 mm right, 4 mm x 20 mm left Successful drug-coated balloon angioplasty with resolution of flow-limiting stenosis in the right distal external iliac artery 6 x 40 mm DCB  Please schedule with me in 3 months. Bilateral renal artery ultrasound ( prior stenting) Abi Aorto-iliac ultrasound (prior bilateral stenting) Carotid duplex (known right occlusion)   Sabrina Santee, MD [facility] Office: [phone]

## 2022-10-25 ENCOUNTER — Encounter: Payer: Self-pay | Admitting: Internal Medicine

## 2022-10-26 ENCOUNTER — Telehealth: Payer: Self-pay | Admitting: Internal Medicine

## 2022-10-26 DIAGNOSIS — K219 Gastro-esophageal reflux disease without esophagitis: Secondary | ICD-10-CM

## 2022-10-26 DIAGNOSIS — N183 Chronic kidney disease, stage 3 unspecified: Secondary | ICD-10-CM

## 2022-10-26 DIAGNOSIS — R051 Acute cough: Secondary | ICD-10-CM

## 2022-10-26 DIAGNOSIS — I10 Essential (primary) hypertension: Secondary | ICD-10-CM

## 2022-10-26 MED ORDER — AMLODIPINE BESYLATE 10 MG PO TABS: 10.0000 mg | ORAL_TABLET | Freq: Every day | ORAL | 1 refills | Status: DC

## 2022-10-26 MED ORDER — NORTRIPTYLINE HCL 10 MG PO CAPS: 10.0000 mg | ORAL_CAPSULE | Freq: Two times a day (BID) | ORAL | 3 refills | Status: DC

## 2022-10-26 MED ORDER — CLOPIDOGREL BISULFATE 75 MG PO TABS: 75.0000 mg | ORAL_TABLET | Freq: Every day | ORAL | 1 refills | Status: DC

## 2022-10-26 NOTE — Telephone Encounter (Signed)
Refilled and pt is aware.

## 2022-10-26 NOTE — Telephone Encounter (Signed)
Medications have been refilled and pt is aware.

## 2022-10-26 NOTE — Telephone Encounter (Signed)
Prescription Request  10/26/2022  Is this a "Controlled Substance" medicine? No  LOV: 09/23/2022  What is the name of the medication or equipment? amLODipine (NORVASC) 10 MG tablet, clopidogrel (PLAVIX) 75 MG tablet, nortriptyline (PAMELOR) 10 MG capsule  Have you contacted your pharmacy to request a refill? No   Which pharmacy would you like this sent to?   [facility] [place] ([facility] [place] - [address] [place] [address] Phone: [phone] Fax: [phone]    Patient notified that their request is being sent to the clinical staff for review and that they should receive a response within 2 business days.   Please advise at [facility] [phone]

## 2022-10-30 ENCOUNTER — Other Ambulatory Visit: Payer: Self-pay | Admitting: Internal Medicine

## 2022-11-02 ENCOUNTER — Telehealth: Payer: Self-pay | Admitting: Internal Medicine

## 2022-11-02 NOTE — Telephone Encounter (Signed)
Pt called staying that Dr. Derrel Nip prescribed cough med and its not working for her, it taste like juice. Per pt med its codeine. She would like to see if med can be change.

## 2022-11-02 NOTE — Telephone Encounter (Signed)
This cough medication was sent in in October 2023.

## 2022-11-03 ENCOUNTER — Ambulatory Visit (INDEPENDENT_AMBULATORY_CARE_PROVIDER_SITE_OTHER): Payer: Medicare Other | Admitting: Family

## 2022-11-03 ENCOUNTER — Encounter: Payer: Self-pay | Admitting: Family

## 2022-11-03 ENCOUNTER — Ambulatory Visit (INDEPENDENT_AMBULATORY_CARE_PROVIDER_SITE_OTHER): Payer: Medicare Other

## 2022-11-03 VITALS — BP 135/65 | HR 68 | Temp 99.0°F | Ht 61.0 in | Wt 159.8 lb

## 2022-11-03 DIAGNOSIS — R059 Cough, unspecified: Secondary | ICD-10-CM | POA: Diagnosis not present

## 2022-11-03 DIAGNOSIS — J449 Chronic obstructive pulmonary disease, unspecified: Secondary | ICD-10-CM | POA: Diagnosis not present

## 2022-11-03 DIAGNOSIS — J40 Bronchitis, not specified as acute or chronic: Secondary | ICD-10-CM

## 2022-11-03 DIAGNOSIS — J811 Chronic pulmonary edema: Secondary | ICD-10-CM | POA: Diagnosis not present

## 2022-11-03 LAB — POC COVID19 BINAXNOW: SARS Coronavirus 2 Ag: NEGATIVE

## 2022-11-03 LAB — POCT INFLUENZA A/B
Influenza A, POC: NEGATIVE
Influenza B, POC: NEGATIVE

## 2022-11-03 MED ORDER — ALBUTEROL SULFATE HFA 108 (90 BASE) MCG/ACT IN AERS: 2.0000 | INHALATION_SPRAY | Freq: Four times a day (QID) | RESPIRATORY_TRACT | 0 refills | Status: DC | PRN

## 2022-11-03 MED ORDER — DOXYCYCLINE HYCLATE 100 MG PO TABS: 100.0000 mg | ORAL_TABLET | Freq: Two times a day (BID) | ORAL | 0 refills | Status: AC

## 2022-11-03 MED ORDER — HYDROCOD POLI-CHLORPHE POLI ER 10-8 MG/5ML PO SUER: 5.0000 mL | Freq: Every evening | ORAL | 0 refills | Status: DC | PRN

## 2022-11-03 NOTE — Telephone Encounter (Signed)
Attempted to call pt. No answer. Mail box is full.  

## 2022-11-03 NOTE — Progress Notes (Signed)
Assessment & Plan:  Bronchitis Assessment & Plan: Patient well-appearing today, nontoxic in appearance.  History of COPD.  In the absence of shortness of breath or wheezing on exam, opted not to start prednisone at this time.  Based on duration of symptoms, start doxycycline.  Refilled albuterol. pending chest x-ray.  Provided her with Tussionex cough syrup as requested.  Orders: -     Hydrocod Poli-Chlorphe Poli ER; Take 5 mLs by mouth at bedtime as needed for cough.  Dispense: 35 mL; Refill: 0 -     Doxycycline Hyclate; Take 1 tablet (100 mg total) by mouth 2 (two) times daily for 7 days.  Dispense: 14 tablet; Refill: 0 -     Albuterol Sulfate HFA; Inhale 2 puffs into the lungs every 6 (six) hours as needed for wheezing or shortness of breath.  Dispense: 8 g; Refill: 0 -     DG Chest 2 View; Future -     POCT Influenza A/B -     POC COVID-19 BinaxNow     Return precautions given.   Risks, benefits, and alternatives of the medications and treatment plan prescribed today were discussed, and patient expressed understanding.   Education regarding symptom management and diagnosis given to patient on AVS either electronically or printed.  Return if symptoms worsen or fail to improve.  Mable Paris, FNP  Subjective:    Patient ID: Sabrina [name], female    DOB: 10-23-1941, 82 y.o.   MRN: [id number]  CC: Sabrina [name] is a 82 y.o. female who presents today for an acute visit.    HPI: Complains of productive cough with clear discharge, 7 days, unchanged.   Cough started out as dry.   She endorses persistent wheezing. Denies fever, CP, sob, leg swelling, chills, sinus pressure , ear pain, sore throat.   She has taken delsym and guaifenesin- codeine which is not helping. She has used tussionex in the past with relief.   Previously seen by Dr Raul Del and on prn albuterol.     She has previously been on codeine syrup  which worked well for her.   Quit smoking 24 years  ago  History of CAD, aortic aneurysm, hypertension, peripheral vascular disease, GERD, hypothyroidism, anemia, CKD ( crt 1.6)   Consult with Dr. Holley Raring, nephrology 12 04/2022 for acute kidney injury, chronic kidney disease stage IV.  Another vascular stent 10/06/22 Dr Virl Cagey H/o stent to LEFT subclavian 11/23/2021.   Visit Dr. Raul Del pulmonology 06/2021 COPD  Allergies: Citalopram, Dilaudid [hydromorphone hcl], Hydrochlorothiazide, Liothyronine, Nsaids, Nubain [nalbuphine hcl], Penicillins, Prasugrel, and Statins Current Outpatient Medications on File Prior to Visit  Medication Sig Dispense Refill   amLODipine (NORVASC) 10 MG tablet Take 1 tablet (10 mg total) by mouth at bedtime. 90 tablet 1   aspirin 81 MG tablet Take 81 mg by mouth daily.     clopidogrel (PLAVIX) 75 MG tablet Take 1 tablet (75 mg total) by mouth at bedtime. To prevent strokes 90 tablet 1   ezetimibe (ZETIA) 10 MG tablet Take 1 tablet (10 mg total) by mouth at bedtime. 90 tablet 1   ferrous sulfate 325 (65 FE) MG tablet Take 1 tablet (325 mg total) by mouth every other day. 45 tablet 0   HYDROcodone-acetaminophen (NORCO) 10-325 MG tablet Take 1 tablet by mouth every 6 (six) hours as needed for moderate pain. 90 tablet 0   labetalol (NORMODYNE) 300 MG tablet Take 1 tablet (300 mg total) by mouth 2 (two) times daily. For  hypertension 180 tablet 1   losartan (COZAAR) 50 MG tablet TAKE 1 TABLET BY MOUTH AT BEDTIME 90 tablet 1   nortriptyline (PAMELOR) 10 MG capsule Take 1 capsule (10 mg total) by mouth 2 (two) times daily. For chronic headaches 180 capsule 3   pantoprazole (PROTONIX) 40 MG tablet One tablet upon waking,  take 30 minuts prior to eating 90 tablet 3   ranolazine (RANEXA) 500 MG 12 hr tablet Take 1 tablet (500 mg total) by mouth 2 (two) times daily. 60 tablet 3   No current facility-administered medications on file prior to visit.    Review of Systems  Constitutional:  Negative for chills and fever.  HENT:   Positive for congestion. Negative for sinus pain.   Respiratory:  Positive for cough and wheezing. Negative for shortness of breath.   Cardiovascular:  Negative for chest pain and palpitations.  Gastrointestinal:  Negative for nausea and vomiting.      Objective:    BP 135/65   Pulse 68   Temp 99 F (37.2 C) (Oral)   Ht 5\' 1"  (1.549 m)   Wt 159 lb 12.8 oz (72.5 kg)   SpO2 99%   BMI 30.19 kg/m   BP Readings from Last 3 Encounters:  11/03/22 135/65  10/06/22 (!) 175/61  09/23/22 128/70   Wt Readings from Last 3 Encounters:  11/03/22 159 lb 12.8 oz (72.5 kg)  10/06/22 160 lb (72.6 kg)  09/23/22 161 lb 9.6 oz (73.3 kg)    Physical Exam Vitals reviewed.  Constitutional:      Appearance: She is well-developed.  HENT:     Head: Normocephalic and atraumatic.     Right Ear: Hearing, tympanic membrane, ear canal and external ear normal. No decreased hearing noted. No drainage, swelling or tenderness. No middle ear effusion. No foreign body. Tympanic membrane is not erythematous or bulging.     Left Ear: Hearing, tympanic membrane, ear canal and external ear normal. No decreased hearing noted. No drainage, swelling or tenderness.  No middle ear effusion. No foreign body. Tympanic membrane is not erythematous or bulging.     Nose: Nose normal. No rhinorrhea.     Right Sinus: No maxillary sinus tenderness or frontal sinus tenderness.     Left Sinus: No maxillary sinus tenderness or frontal sinus tenderness.     Mouth/Throat:     Pharynx: Uvula midline. No oropharyngeal exudate or posterior oropharyngeal erythema.     Tonsils: No tonsillar abscesses.  Eyes:     Conjunctiva/sclera: Conjunctivae normal.  Cardiovascular:     Rate and Rhythm: Regular rhythm.     Pulses: Normal pulses.     Heart sounds: Normal heart sounds.  Pulmonary:     Effort: Pulmonary effort is normal.     Breath sounds: Normal breath sounds. No wheezing, rhonchi or rales.  Lymphadenopathy:     Head:      Right side of head: No submental, submandibular, tonsillar, preauricular, posterior auricular or occipital adenopathy.     Left side of head: No submental, submandibular, tonsillar, preauricular, posterior auricular or occipital adenopathy.     Cervical: No cervical adenopathy.  Skin:    General: Skin is warm and dry.  Neurological:     Mental Status: She is alert.  Psychiatric:        Speech: Speech normal.        Behavior: Behavior normal.        Thought Content: Thought content normal.

## 2022-11-03 NOTE — Patient Instructions (Addendum)
I have sent an albuterol inhaler for you to use for wheezing   please start antibiotic, doxycycline for cough.  I have sent in Tussionex which is a very strong cough medication which you have been on in the past.  Please take this at bedtime only.  During the day I prefer if you uses Delsym which is less sedating Please take cough medication at night only as needed. As we discussed, I do not recommend dosing throughout the day as coughing is a protective mechanism . It also helps to break up thick mucous.  Do not take cough suppressants with alcohol as can lead to trouble breathing. Advise caution if taking cough suppressant and operating machinery ( i.e driving a car) as you may feel very tired.     Ensure to take probiotics while on antibiotics and also for 2 weeks after completion. This can either be by eating yogurt daily or taking a probiotic supplement over the counter such as Culturelle.It is important to re-colonize the gut with good bacteria and also to prevent any diarrheal infections associated with antibiotic use.   Acute Bronchitis, Adult  Acute bronchitis is sudden inflammation of the main airways (bronchi) that come off the windpipe (trachea) in the lungs. The swelling causes the airways to get smaller and make more mucus than normal. This can make it hard to breathe and can cause coughing or noisy breathing (wheezing). Acute bronchitis may last several weeks. The cough may last longer. Allergies, asthma, and exposure to smoke may make the condition worse. What are the causes? This condition can be caused by germs and by substances that irritate the lungs, including: Cold and flu viruses. The most common cause of this condition is the virus that causes the common cold. Bacteria. This is less common. Breathing in substances that irritate the lungs, including: Smoke from cigarettes and other forms of tobacco. Dust and pollen. Fumes from household cleaning products, gases, or burned  fuel. Indoor or outdoor air pollution. What increases the risk? The following factors may make you more likely to develop this condition: A weak body's defense system, also called the immune system. A condition that affects your lungs and breathing, such as asthma. What are the signs or symptoms? Common symptoms of this condition include: Coughing. This may bring up clear, yellow, or green mucus from your lungs (sputum). Wheezing. Runny or stuffy nose. Having too much mucus in your lungs (chest congestion). Shortness of breath. Aches and pains, including sore throat or chest. How is this diagnosed? This condition is usually diagnosed based on: Your symptoms and medical history. A physical exam. You may also have other tests, including tests to rule out other conditions, such as pneumonia. These tests include: A test of lung function. Test of a mucus sample to look for the presence of bacteria. Tests to check the oxygen level in your blood. Blood tests. Chest X-ray. How is this treated? Most cases of acute bronchitis clear up over time without treatment. Your health care provider may recommend: Drinking more fluids to help thin your mucus so it is easier to cough up. Taking inhaled medicine (inhaler) to improve air flow in and out of your lungs. Using a vaporizer or a humidifier. These are machines that add water to the air to help you breathe better. Taking a medicine that thins mucus and clears congestion (expectorant). Taking a medicine that prevents or stops coughing (cough suppressant). It is not common to take an antibiotic medicine for this condition. Follow  these instructions at home:  Take over-the-counter and prescription medicines only as told by your health care provider. Use an inhaler, vaporizer, or humidifier as told by your health care provider. Take two teaspoons (10 mL) of honey at bedtime to lessen coughing at night. Drink enough fluid to keep your urine pale  yellow. Do not use any products that contain nicotine or tobacco. These products include cigarettes, chewing tobacco, and vaping devices, such as e-cigarettes. If you need help quitting, ask your health care provider. Get plenty of rest. Return to your normal activities as told by your health care provider. Ask your health care provider what activities are safe for you. Keep all follow-up visits. This is important. How is this prevented? To lower your risk of getting this condition again: Wash your hands often with soap and water for at least 20 seconds. If soap and water are not available, use hand sanitizer. Avoid contact with people who have cold symptoms. Try not to touch your mouth, nose, or eyes with your hands. Avoid breathing in smoke or chemical fumes. Breathing smoke or chemical fumes will make your condition worse. Get the flu shot every year. Contact a health care provider if: Your symptoms do not improve after 2 weeks. You have trouble coughing up the mucus. Your cough keeps you awake at night. You have a fever. Get help right away if you: Cough up blood. Feel pain in your chest. Have severe shortness of breath. Faint or keep feeling like you are going to faint. Have a severe headache. Have a fever or chills that get worse. These symptoms may represent a serious problem that is an emergency. Do not wait to see if the symptoms will go away. Get medical help right away. Call your local emergency services (911 in the [place] Do not drive yourself to the hospital. Summary Acute bronchitis is inflammation of the main airways (bronchi) that come off the windpipe (trachea) in the lungs. The swelling causes the airways to get smaller and make more mucus than normal. Drinking more fluids can help thin your mucus so it is easier to cough up. Take over-the-counter and prescription medicines only as told by your health care provider. Do not use any products that contain nicotine or  tobacco. These products include cigarettes, chewing tobacco, and vaping devices, such as e-cigarettes. If you need help quitting, ask your health care provider. Contact a health care provider if your symptoms do not improve after 2 weeks. This information is not intended to replace advice given to you by your health care provider. Make sure you discuss any questions you have with your health care provider. Document Revised: 01/21/2022 Document Reviewed: 02/11/2021 Elsevier Patient Education  [place].

## 2022-11-03 NOTE — Telephone Encounter (Signed)
Patient called and appt made for today with Arnett.

## 2022-11-03 NOTE — Telephone Encounter (Signed)
noted 

## 2022-11-03 NOTE — Assessment & Plan Note (Addendum)
Patient well-appearing today, nontoxic in appearance.  History of COPD.  In the absence of shortness of breath or wheezing on exam, opted not to start prednisone at this time.  Based on duration of symptoms, start doxycycline.  Refilled albuterol. pending chest x-ray.  Provided her with Tussionex cough syrup as requested.

## 2022-11-08 ENCOUNTER — Encounter: Payer: Self-pay | Admitting: Internal Medicine

## 2022-11-09 MED ORDER — EZETIMIBE 10 MG PO TABS: 10.0000 mg | ORAL_TABLET | Freq: Every day | ORAL | 3 refills | Status: DC

## 2022-11-10 DIAGNOSIS — I10 Essential (primary) hypertension: Secondary | ICD-10-CM | POA: Diagnosis not present

## 2022-11-10 DIAGNOSIS — N1832 Chronic kidney disease, stage 3b: Secondary | ICD-10-CM | POA: Diagnosis not present

## 2022-11-10 DIAGNOSIS — N2581 Secondary hyperparathyroidism of renal origin: Secondary | ICD-10-CM | POA: Diagnosis not present

## 2022-11-10 DIAGNOSIS — D631 Anemia in chronic kidney disease: Secondary | ICD-10-CM | POA: Diagnosis not present

## 2022-11-12 ENCOUNTER — Ambulatory Visit (INDEPENDENT_AMBULATORY_CARE_PROVIDER_SITE_OTHER): Payer: Medicare Other | Admitting: Internal Medicine

## 2022-11-12 ENCOUNTER — Encounter: Payer: Self-pay | Admitting: Internal Medicine

## 2022-11-12 VITALS — BP 124/78 | HR 71 | Temp 98.1°F | Ht 61.0 in | Wt 161.2 lb

## 2022-11-12 DIAGNOSIS — R21 Rash and other nonspecific skin eruption: Secondary | ICD-10-CM | POA: Diagnosis not present

## 2022-11-12 DIAGNOSIS — I739 Peripheral vascular disease, unspecified: Secondary | ICD-10-CM

## 2022-11-12 MED ORDER — FLUCONAZOLE 150 MG PO TABS: ORAL_TABLET | ORAL | 0 refills | Status: DC

## 2022-11-12 MED ORDER — TRIAMCINOLONE ACETONIDE 0.1 % EX CREA: 1.0000 | TOPICAL_CREAM | Freq: Two times a day (BID) | CUTANEOUS | 0 refills | Status: DC

## 2022-11-12 NOTE — Assessment & Plan Note (Signed)
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 WILL TREAT WITH ORAL FLUCONAZOLE  150 MG WEEKLY X 4 WEEKS AND TRIAMCINOLONE CREAM FOR ITCHING

## 2022-11-12 NOTE — Patient Instructions (Addendum)
I AM TREATING YOU FOR A FUNGAL INFECTION   TAKE ONE FLUCONAZOLE TABLET ONE A WEEK FOR 4 WEEKS  (EVERY SATURDAY)  USE THE CREAM TWICE DAILY ON THE ITCHY SPOTS  RETURN IN ONE MONTH

## 2022-11-12 NOTE — Progress Notes (Signed)
Subjective:  Patient ID: Sabrina [name], female    DOB: 11-18-1940  Age: 82 y.o. MRN: [id number]  CC: The primary encounter diagnosis was Rash in adult. A diagnosis of Peripheral vascular disease ([place]) was also pertinent to this visit.   HPI Sabrina [name] presents for spreading rash  RASH ON ARM ,  First noticed on her right ankle 9 months aog.  Now on her hands,  r thighs extensor surfaces  of elbow  ,  intensely  itchy.  Has only used moisturizer .  No contact with kids,  no hotel stays.  No changes in soap, feels it got aggravated by Dawn dish  detergent.  Uses Dove soap,  and Tide   Ankle starts itching when she goes into, bed  no bed bugs  no pets.      Outpatient Medications Prior to Visit  Medication Sig Dispense Refill   albuterol (VENTOLIN HFA) 108 (90 Base) MCG/ACT inhaler Inhale 2 puffs into the lungs every 6 (six) hours as needed for wheezing or shortness of breath. 8 g 0   amLODipine (NORVASC) 10 MG tablet Take 1 tablet (10 mg total) by mouth at bedtime. 90 tablet 1   aspirin 81 MG tablet Take 81 mg by mouth daily.     chlorpheniramine-HYDROcodone (TUSSIONEX) 10-8 MG/5ML Take 5 mLs by mouth at bedtime as needed for cough. 35 mL 0   clopidogrel (PLAVIX) 75 MG tablet Take 1 tablet (75 mg total) by mouth at bedtime. To prevent strokes 90 tablet 1   ezetimibe (ZETIA) 10 MG tablet Take 1 tablet (10 mg total) by mouth at bedtime. 90 tablet 3   ferrous sulfate 325 (65 FE) MG tablet Take 1 tablet (325 mg total) by mouth every other day. 45 tablet 0   HYDROcodone-acetaminophen (NORCO) 10-325 MG tablet Take 1 tablet by mouth every 6 (six) hours as needed for moderate pain. 90 tablet 0   labetalol (NORMODYNE) 300 MG tablet Take 1 tablet (300 mg total) by mouth 2 (two) times daily. For hypertension 180 tablet 1   losartan (COZAAR) 50 MG tablet TAKE 1 TABLET BY MOUTH AT BEDTIME 90 tablet 1   nortriptyline (PAMELOR) 10 MG capsule Take 1 capsule (10 mg total) by mouth 2 (two)  times daily. For chronic headaches 180 capsule 3   pantoprazole (PROTONIX) 40 MG tablet One tablet upon waking,  take 30 minuts prior to eating 90 tablet 3   ranolazine (RANEXA) 500 MG 12 hr tablet Take 1 tablet (500 mg total) by mouth 2 (two) times daily. 60 tablet 3   No facility-administered medications prior to visit.    Review of Systems;  Patient denies headache, fevers, malaise, unintentional weight loss, skin rash, eye pain, sinus congestion and sinus pain, sore throat, dysphagia,  hemoptysis , cough, dyspnea, wheezing, chest pain, palpitations, orthopnea, edema, abdominal pain, nausea, melena, diarrhea, constipation, flank pain, dysuria, hematuria, urinary  Frequency, nocturia, numbness, tingling, seizures,  Focal weakness, Loss of consciousness,  Tremor, insomnia, depression, anxiety, and suicidal ideation.      Objective:  BP 124/78   Pulse 71   Temp 98.1 F (36.7 C) (Oral)   Ht 5\' 1"  (1.549 m)   Wt 161 lb 3.2 oz (73.1 kg)   SpO2 99%   BMI 30.46 kg/m   BP Readings from Last 3 Encounters:  11/12/22 124/78  11/03/22 135/65  10/06/22 (!) 175/61    Wt Readings from Last 3 Encounters:  11/12/22 161 lb 3.2 oz (73.1 kg)  11/03/22 159 lb 12.8 oz (72.5 kg)  10/06/22 160 lb (72.6 kg)    Physical Exam  Lab Results  Component Value Date   HGBA1C 5.9 07/19/2016   HGBA1C 5.8 06/02/2015    Lab Results  Component Value Date   CREATININE 1.90 (H) 10/06/2022   CREATININE 1.66 (H) 09/23/2022   CREATININE 2.30 (H) 09/22/2022    Lab Results  Component Value Date   WBC 4.9 09/23/2022   HGB 10.2 (L) 10/06/2022   HCT 30.0 (L) 10/06/2022   PLT 303.0 09/23/2022   GLUCOSE 94 10/06/2022   CHOL 206 (H) 01/28/2022   TRIG 61 01/28/2022   HDL 68 01/28/2022   LDLDIRECT 109.0 03/05/2016   LDLCALC 123 (H) 01/28/2022   ALT 16 04/06/2022   AST 23 04/06/2022   NA 139 10/06/2022   K 4.2 10/06/2022   CL 104 10/06/2022   CREATININE 1.90 (H) 10/06/2022   BUN 30 (H) 10/06/2022    CO2 26 09/23/2022   TSH 7.67 (H) 06/22/2021   INR 1.3 (H) 03/31/2022   HGBA1C 5.9 07/19/2016    VAS US RENAL ARTERY DUPLEX  Result Date: 10/06/2022 ABDOMINAL VISCERAL Patient Name:  Sabrina [name]  Date of Exam:   10/06/2022 Medical Rec #: [id number]           Accession #:    [id number] Date of Birth: [birth year]           Patient Gender: F Patient Age:   8 years Exam Location:  [facility] Procedure:      VAS US RENAL ARTERY DUPLEX Referring Phys: [id number] JOSHUA E ROBINS -------------------------------------------------------------------------------- Indications: Status post stenting of bilateral renal arteries. Limitations: Air/bowel gas and Not NPO. Comparison Study: 09/22/22 Performing Technologist: Oda Cogan RDMS, RVT  Examination Guidelines: A complete evaluation includes B-mode imaging, spectral Doppler, color Doppler, and power Doppler as needed of all accessible portions of each vessel. Bilateral testing is considered an integral part of a complete examination. Limited examinations for reoccurring indications may be performed as noted.  Duplex Findings: +------------------+--------+--------+-------+ Right Renal ArteryPSV cm/sEDV cm/sComment +------------------+--------+--------+-------+ Origin              153      18           +------------------+--------+--------+-------+ Proximal            163      21           +------------------+--------+--------+-------+ Mid                  96      16           +------------------+--------+--------+-------+ Distal              106      17           +------------------+--------+--------+-------+ +-----------------+--------+--------+-------------------+ Left Renal ArteryPSV cm/sEDV cm/s      Comment       +-----------------+--------+--------+-------------------+ Origin                           Not well visualized +-----------------+--------+--------+-------------------+ Proximal            85      16                        +-----------------+--------+--------+-------------------+ Mid                 54  9                        +-----------------+--------+--------+-------------------+ Distal              50      14                       +-----------------+--------+--------+-------------------+  Summary: Renal:  Right: Patent right renal artery stent. Left:  Patent left renal artery stent.  *See table(s) above for measurements and observations.  Diagnosing physician: Orlie Pollen  Electronically signed by Orlie Pollen on 10/06/2022 at 8:19:21 PM.    Final    PERIPHERAL VASCULAR CATHETERIZATION  Result Date: 10/06/2022 Images from the original result were not included. Patient name: Sabrina [name] MRN: [id number] DOB: [birth year] Sex: female 10/06/2022 Pre-operative Diagnosis: Peripheral arterial disease with elevated stent velocities.  Indication-primary assisted patency of bilateral iliac artery stents. Post-operative diagnosis:  Same Surgeon:  Broadus John, MD Procedure Performed: 1.  Ultrasound-guided micropuncture access to the left common femoral artery 2.  Aortogram 3.  Right external iliac drug-coated balloon angioplasty 6 x 40 mm 4.  Bilateral selective renal artery angiogram 5.  Left renal artery balloon angioplasty 6 x 20 mm 6.  Right renal artery angioplasty 4 x 20 mm 7.  Contrast volume 100 mL 8.  Sedation 109 minutes Indications: Patient is an 82 year old female with peripheral arterial disease and multiple vascular beds.  She is known to my practice but has had all of her endovascular work done at [name].  This includes bilateral renal artery stenting, BL common iliac artery stenting, L external iliac artery stenting, left subclavian artery stenting.  Recent ultrasound demonstrated elevated velocities through her external iliac artery stents.  After discussing the risk and benefits of aortogram with possible intervention to define and possibly improve blood flow for primary  assisted patency of previously placed stents, Gailyn elected to proceed. Findings: Aortogram: 60% in-stent stenosis of the right renal artery, 70% in-stent stenosis of the left renal artery Dilated infrarenal abdominal aorta.  Bilateral common iliac artery stents widely patent, left-sided external iliac stent widely patent.  Focal, 70% stenosis of the distal external iliac  Procedure:  The patient was identified in the holding area and taken to room 8.  The patient was then placed supine on the table and prepped and draped in the usual sterile fashion.  A time out was called.  Ultrasound was used to evaluate the left common femoral artery.  It was patent .  A digital ultrasound image was acquired.  A micropuncture needle was used to access the left common femoral artery under ultrasound guidance.  An 018 wire was advanced without resistance and a micropuncture sheath was placed.  The 018 wire was removed and a benson wire was placed.  The micropuncture sheath was exchanged for a 5 french sheath.  An omniflush catheter was advanced over the wire to the level of L-1.  An abdominal angiogram was obtained..  See findings above. I elected to intervene on the right distal external iliac artery, as well as the bilateral renal arteries as the patient's creatinine remains elevated from its baseline.  An Omni Flush catheter was used to navigate the artery bifurcation and a Rosen wire was parked in the right profunda.  Next, a 6 [place] by 45 cm sheath was parked in the proximal external iliac artery.  Patient was heparinized with 5000 units IV heparin.  A 6  x 40 drug-coated balloon was brought onto the field and for 3 minutes in the distal external iliac artery across the flow-limiting lesion.  Follow-up angiography demonstrated excellent result with resolution of the stenosis.  Next, my attention turned to bilateral renal arteries.  Using a series of wires and catheters, I was able to cannulate the left renal artery.  There  was in-stent stenosis measuring 70%.  I elected to treat the previously placed 4 mm stent with a 4 mm x 20 mm balloon which was inflated for 2 minutes.  Follow-up angiography demonstrated excellent result with resolution of flow-limiting stenosis.  Next, my attention turned to the left renal artery.  Angiography demonstrated 60% stenosis.  Being that the patient continues to have elevated creatinine, I elected to treat this lesion with a 5 mm x 20 mm balloon.  This is inflated for 2 minutes.  Follow-up angiography demonstrated excellent result with resolution of the stenosis. I performed another diagnostic angiogram of the terminal aorta to ensure the common neck artery stents were not affected by the intervention.  They were not.  There was excellent flow distally. Impression: Successful balloon angioplasty with resolution of flow-limiting stenosis in bilateral renal arteries 5 mm right 20 mm right, 4 mm x 20 mm left Successful drug-coated balloon angioplasty with resolution of flow-limiting stenosis in the right distal external iliac artery 6 x 40 mm DCB [name], MD [facility] Office: [phone]    Assessment & Plan:  .Rash in adult Assessment & Plan: [place].  HIGHLY PRURITIC. WILL TREAT WITH ORAL FLUCONAZOLE  150 MG WEEKLY X 4 WEEKS AND TRIAMCINOLONE CREAM FOR ITCHING    Peripheral vascular disease ([place]) Assessment & Plan: She underwent aortogram and balloon  angioplasty on Oct 08 2022 :   Right external iliac drug-coated balloon angioplasty 6 x 40 mm  4.  Bilateral selective renal artery angiogram  5.  Left renal artery balloon angioplasty 6 x 20 mm  6.  Right renal artery angioplasty 4 x 20 mm     Other orders -     Triamcinolone Acetonide; Apply 1 Application topically 2 (two) times daily. To all areas of rash  Dispense: 453.6 g; Refill: 0 -     Fluconazole; ONE TABLET ONCE A WEEK FOR 4 WEEKS  FOR FUNGAL RASH  Dispense: 4 tablet; Refill: 0     I provided 39 minutes of face-to-face time during this encounter reviewing patient's last visit with me, patient's  most recent visit with vascular surgery, ,  recent surgical and non surgical procedures, previous  labs and imaging studies, counseling on currently addressed issues,  and post visit ordering to diagnostics and therapeutics .   Follow-up: Return in about 4 weeks (around 12/10/2022).   Crecencio Mc, MD

## 2022-11-14 NOTE — Assessment & Plan Note (Addendum)
She underwent aortogram and balloon  angioplasty on Oct 08 2022 :   Right external iliac drug-coated balloon angioplasty 6 x 40 mm  4.  Bilateral selective renal artery angiogram  5.  Left renal artery balloon angioplasty 6 x 20 mm  6.  Right renal artery angioplasty 4 x 20 mm

## 2022-11-29 ENCOUNTER — Encounter: Payer: Self-pay | Admitting: Internal Medicine

## 2022-12-14 ENCOUNTER — Telehealth (HOSPITAL_COMMUNITY): Payer: Self-pay

## 2022-12-14 NOTE — Telephone Encounter (Signed)
Called to schedule consult with Dr. Estanislado Pandy, no answer, left vm. AB

## 2022-12-21 ENCOUNTER — Encounter: Payer: Medicare Other | Admitting: *Deleted

## 2022-12-28 ENCOUNTER — Other Ambulatory Visit: Payer: Self-pay

## 2022-12-28 DIAGNOSIS — I701 Atherosclerosis of renal artery: Secondary | ICD-10-CM

## 2022-12-28 DIAGNOSIS — Z9889 Other specified postprocedural states: Secondary | ICD-10-CM

## 2022-12-28 DIAGNOSIS — I6521 Occlusion and stenosis of right carotid artery: Secondary | ICD-10-CM

## 2022-12-28 DIAGNOSIS — I6523 Occlusion and stenosis of bilateral carotid arteries: Secondary | ICD-10-CM

## 2022-12-28 DIAGNOSIS — I739 Peripheral vascular disease, unspecified: Secondary | ICD-10-CM

## 2022-12-28 NOTE — Addendum Note (Signed)
Addended by: Dorita Sciara, Genae Strine A on: 12/28/2022 05:18 PM   Modules accepted: Orders

## 2022-12-28 NOTE — Addendum Note (Signed)
Addended by: Dorita Sciara, Judaea Burgoon A on: 12/28/2022 05:23 PM   Modules accepted: Orders

## 2022-12-30 ENCOUNTER — Telehealth: Payer: Self-pay

## 2022-12-30 ENCOUNTER — Encounter: Payer: Self-pay | Admitting: Internal Medicine

## 2022-12-30 NOTE — Telephone Encounter (Signed)
Prescription Request  12/30/2022  LOV: Visit date not found  What is the name of the medication or equipment? labetalol (NORMODYNE) 300 MG tablet  Have you contacted your pharmacy to request a refill? No   Which pharmacy would you like this sent to?  [facility] [place] - [address] [place] ([place]) [place] [address] Phone: [phone] Fax: [phone]    Patient notified that their request is being sent to the clinical staff for review and that they should receive a response within 2 business days.   Please advise at [facility] [phone]   Patient states she has about seven pills left and she takes two a day.

## 2022-12-31 ENCOUNTER — Other Ambulatory Visit: Payer: Self-pay

## 2022-12-31 DIAGNOSIS — I10 Essential (primary) hypertension: Secondary | ICD-10-CM

## 2022-12-31 MED ORDER — LABETALOL HCL 300 MG PO TABS: 300.0000 mg | ORAL_TABLET | Freq: Two times a day (BID) | ORAL | 1 refills | Status: DC

## 2022-12-31 NOTE — Telephone Encounter (Signed)
sent 

## 2023-01-06 DIAGNOSIS — K219 Gastro-esophageal reflux disease without esophagitis: Secondary | ICD-10-CM | POA: Diagnosis not present

## 2023-01-06 DIAGNOSIS — E782 Mixed hyperlipidemia: Secondary | ICD-10-CM | POA: Diagnosis not present

## 2023-01-06 DIAGNOSIS — Z951 Presence of aortocoronary bypass graft: Secondary | ICD-10-CM | POA: Diagnosis not present

## 2023-01-06 DIAGNOSIS — I739 Peripheral vascular disease, unspecified: Secondary | ICD-10-CM | POA: Diagnosis not present

## 2023-01-06 DIAGNOSIS — I050 Rheumatic mitral stenosis: Secondary | ICD-10-CM | POA: Diagnosis not present

## 2023-01-06 DIAGNOSIS — I5032 Chronic diastolic (congestive) heart failure: Secondary | ICD-10-CM | POA: Diagnosis not present

## 2023-01-06 DIAGNOSIS — I10 Essential (primary) hypertension: Secondary | ICD-10-CM | POA: Diagnosis not present

## 2023-01-06 DIAGNOSIS — J449 Chronic obstructive pulmonary disease, unspecified: Secondary | ICD-10-CM | POA: Diagnosis not present

## 2023-01-06 DIAGNOSIS — I2510 Atherosclerotic heart disease of native coronary artery without angina pectoris: Secondary | ICD-10-CM | POA: Diagnosis not present

## 2023-01-06 DIAGNOSIS — I350 Nonrheumatic aortic (valve) stenosis: Secondary | ICD-10-CM | POA: Diagnosis not present

## 2023-01-14 ENCOUNTER — Ambulatory Visit (INDEPENDENT_AMBULATORY_CARE_PROVIDER_SITE_OTHER): Payer: Medicare Other | Admitting: Vascular Surgery

## 2023-01-14 ENCOUNTER — Ambulatory Visit (INDEPENDENT_AMBULATORY_CARE_PROVIDER_SITE_OTHER)
Admission: RE | Admit: 2023-01-14 | Discharge: 2023-01-14 | Disposition: A | Discharge status: Home / Self Care | Payer: Medicare Other | Source: Ambulatory Visit | Attending: Vascular Surgery | Admitting: Vascular Surgery

## 2023-01-14 ENCOUNTER — Ambulatory Visit (HOSPITAL_COMMUNITY)
Admission: RE | Admit: 2023-01-14 | Discharge: 2023-01-14 | Disposition: A | Discharge status: Home / Self Care | Payer: Medicare Other | Source: Ambulatory Visit | Attending: Vascular Surgery | Admitting: Vascular Surgery

## 2023-01-14 ENCOUNTER — Encounter (HOSPITAL_COMMUNITY): Payer: Self-pay

## 2023-01-14 ENCOUNTER — Encounter: Payer: Self-pay | Admitting: Vascular Surgery

## 2023-01-14 VITALS — BP 178/77 | HR 74 | Temp 98.4°F | Resp 20 | Ht 61.0 in | Wt 164.0 lb

## 2023-01-14 DIAGNOSIS — I6523 Occlusion and stenosis of bilateral carotid arteries: Secondary | ICD-10-CM | POA: Insufficient documentation

## 2023-01-14 DIAGNOSIS — Z9889 Other specified postprocedural states: Secondary | ICD-10-CM

## 2023-01-14 DIAGNOSIS — I739 Peripheral vascular disease, unspecified: Secondary | ICD-10-CM

## 2023-01-14 DIAGNOSIS — T82856A Stenosis of peripheral vascular stent, initial encounter: Secondary | ICD-10-CM

## 2023-01-14 DIAGNOSIS — I7143 Infrarenal abdominal aortic aneurysm, without rupture: Secondary | ICD-10-CM

## 2023-01-14 DIAGNOSIS — I6521 Occlusion and stenosis of right carotid artery: Secondary | ICD-10-CM | POA: Diagnosis not present

## 2023-01-14 DIAGNOSIS — Z95828 Presence of other vascular implants and grafts: Secondary | ICD-10-CM

## 2023-01-14 DIAGNOSIS — I701 Atherosclerosis of renal artery: Secondary | ICD-10-CM

## 2023-01-14 LAB — VAS US ABI WITH/WO TBI
Left ABI: 0.59
Right ABI: 0.54

## 2023-01-14 NOTE — Progress Notes (Signed)
Office Note    HPI: Luane A Bushey is a 82 y.o. (02-11-1941) female presenting in follow up s/p right groin exploration, redo CFA patch plasty with ipsilateral GSV on 03/30/22.  Prior surgeries were performed at [facility] and include: Right renal artery stenting 93mm left renal artery stenting (60mm) (2015) Left subclavian artery stenting 31mm (2023) Bilateral common iliac artery stenting Bilateral external iliac stenting Left common femoral, profunda femoris, and superficial femoral artery endarterectomies Right common femoral, profunda femoris, and superficial femoral artery endarterectomies Stent placement to left external iliac artery with 8 mm diameter by 4 cm length of life star stent ___  09/2022 balloon angioplasty of the right EIA, bilateral renal angioplasty   On exam today, Melanie was doing well. She noted some lower extremity swelling and tightness. This has improved significantly since stopping amlodipine.  She denied claudication, rest pain, ischemic tissue loss.   Denies food fear, weight loss.  Denies stroke, TIA, amaurosis symptoms. Denies abdominal pain, back pain.   The pt is  on a daily aspirin.   Other AC:  plavix The pt is  on medication for hypertension.   The pt is not diabetic.  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 b.) TTE 11/16/2021: EF >55%; mild AS (MPG 9 mmHg)   CAD (coronary artery disease)    a.) s/p 3v CABG 03/29/2000   Cardiac arrest [facility])    Carotid artery stenosis    a.) s/p LEFT CEA 09/09/2003. b.) Carotid doppler  [id number]: [id number] LICA, CTO RICA; subclavian stenosis   Cataracts, bilateral    Cervical spondylosis without myelopathy    Chronic diastolic CHF (congestive heart failure), NYHA class 3 (HCC)    a.) TTE 05/27/2016: EF >55%, mild LA enlargement, triv PR, mild MR, mod TR; G3DD. b.) TTE 12/12/2017: EF >55%, mild LVH, BAE, mild MR/PR, mod TR; RVSP 52.8 mmHg. c.) TTE 03/18/2020: EF >55%, BAD, AS (MPG 8.7 mmHg); triv MR, mild TR/PR. d.) TTE 11/16/2021: EF >55%, LVH, G1DD, triv MR, mild PR, mod TR; AS (MPG 9 mmHg); MS (MPG 5 mmHg)   Chronic kidney disease, stage III (moderate) (HCC)    Chronic narcotic use 06/24/2014   Chronic pain syndrome    GERD (gastroesophageal reflux disease)    History of 2019 novel coronavirus disease (COVID-19) 09/22/2021   Hyperlipidemia    Hypertension    Long term current use of antithrombotics/antiplatelets    a.) on daily DAPT therapy (ASA + clopidogrel)   Lumbar stenosis with neurogenic claudication    Mitral stenosis 11/16/2021   a.) TTE 11/16/2021: EF >55%; mod MS (MPG 5 mmHg)   Osteoarthritis of hip    Postoperative wound infection 03/29/2022   Pulmonary hypertension ([place]) 12/12/2017   a.) TTE 12/12/2017: mild; RVSP 52.8 mmHg   PVD (peripheral vascular disease) (HCC)    Renal artery stenosis (HCC)    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    Past Surgical History:  Procedure Laterality Date   ABDOMINAL AORTOGRAM W/LOWER EXTREMITY N/A 10/06/2022   Procedure: ABDOMINAL AORTOGRAM W/LOWER EXTREMITY;  Surgeon: Broadus John, MD;  Location: [facility];  Service: Cardiovascular;  Laterality: N/A;   ABDOMINAL HYSTERECTOMY  1976   CAROTID ARTERY ANGIOPLASTY Left    CAROTID ENDARTERECTOMY Left 09/09/2003   Procedure: CAROTID ENDARTERECTOMY;  Location: [facility]; Surgeon: Maura Crandall, MD   COLONOSCOPY WITH PROPOFOL N/A 08/16/2017   Procedure: COLONOSCOPY WITH PROPOFOL;  Surgeon: [name], MD;  Location: [facility];  Service: Endoscopy;  Laterality: N/A;   CORONARY ANGIOPLASTY WITH STENT PLACEMENT  2000   CORONARY ARTERY BYPASS GRAFT N/A 03/29/2000   Procedure: 3v CORONARY ARTERY BYPASS GRAFT; Location: [facility]   CYSTOSCOPY WITH STENT PLACEMENT Bilateral    ENDARTERECTOMY FEMORAL Bilateral 03/17/2022   Procedure: ENDARTERECTOMY FEMORAL ( BILATERAL SFA STENT);  Surgeon: Katha Cabal, MD;  Location: [facility];  Service: Vascular;  Laterality: Bilateral;   ENDARTERECTOMY FEMORAL Right 03/30/2022   Procedure: RE-EXPOSURE OF RIGHT COMMON FEMORAL ARTERY, RE-DO RIGHT FEMORAL ENDARTERECTOMY WITH VEIN PATCH;  Surgeon: Broadus John, MD;  Location: [facility];  Service: Vascular;  Laterality: Right;  WOUND VAC   ESOPHAGOGASTRODUODENOSCOPY (EGD) WITH PROPOFOL N/A 08/16/2017   Procedure: ESOPHAGOGASTRODUODENOSCOPY (EGD) WITH PROPOFOL;  Surgeon: [name], MD;  Location: [facility];  Service: Endoscopy;  Laterality: N/A;   ESOPHAGOGASTRODUODENOSCOPY (EGD) WITH PROPOFOL N/A 06/29/2018   Procedure: ESOPHAGOGASTRODUODENOSCOPY (EGD) WITH PROPOFOL;  Surgeon: Virgel Manifold, MD;  Location: [facility];  Service: Endoscopy;  Laterality: N/A;   GROIN DEBRIDEMENT Right 03/30/2022   Procedure: [place];  Surgeon: Broadus John, MD;  Location: [place];  Service: Vascular;  Laterality: Right;   INSERTION OF ILIAC STENT Left 03/17/2022   Procedure: INSERTION OF ILIAC STENT;  Surgeon: Katha Cabal, MD;  Location: [facility];  Service: Vascular;  Laterality: Left;   LAPAROSCOPIC CHOLECYSTECTOMY Left 10/26/1999   Procedure: LAPAROSCOPIC CHOLECYSTECTOMY; Location: [facility]; Surgeon: Rochel Brome, MD   LEFT HEART CATH AND CORONARY ANGIOGRAPHY N/A 11/18/2021   Procedure: LEFT HEART CATH AND CORONARY ANGIOGRAPHY;   Surgeon: Yolonda Kida, MD;  Location: [facility];  Service: Cardiovascular;  Laterality: N/A;   LEFT HEART CATH AND CORS/GRAFTS ANGIOGRAPHY Left 11/19/2002   Procedure: LEFT HEART CATH AND CORS/GRAFTS ANGIOGRAPHY; Location: [facility]; Surgeon: Katrine Coho, MD   LEFT HEART CATH AND CORS/GRAFTS ANGIOGRAPHY Left 09/17/2003   Procedure: LEFT HEART CATH AND CORS/GRAFTS ANGIOGRAPHY; Location: [place]; Surgeon: Katrine Coho, MD   LOWER EXTREMITY ANGIOGRAPHY Right 01/06/2022   Procedure: Lower Extremity Angiography;  Surgeon: Katha Cabal, MD;  Location: [facility];  Service: Cardiovascular;  Laterality: Right;   PERIPHERAL VASCULAR BALLOON ANGIOPLASTY  10/06/2022   Procedure: PERIPHERAL VASCULAR BALLOON ANGIOPLASTY;  Surgeon: Broadus John, MD;  Location: [facility];  Service: Cardiovascular;;  left iliac and bilateral renal   RENAL ARTERY ANGIOPLASTY Bilateral 12/2013   TOE AMPUTATION Right    small toe   TONSILLECTOMY AND ADENOIDECTOMY     TOTAL HIP ARTHROPLASTY Left 2005   TOTAL HIP ARTHROPLASTY Right 2015   UPPER EXTREMITY ANGIOGRAPHY Left 11/23/2021   Procedure: UPPER EXTREMITY ANGIOGRAPHY;  Surgeon: Algernon Huxley, MD;  Location: [facility];  Service: Cardiovascular;  Laterality: Left;    Social History   Socioeconomic History   Marital status: Divorced  Spouse name: Not on file   Number of children: 3   Years of education: Not on file   Highest education level: Not on file  Occupational History   Occupation: retired  Tobacco Use   Smoking status: Former    Types: Cigarettes    Quit date: 10/25/1998    Years since quitting: 24.2   Smokeless tobacco: Never  Vaping Use   Vaping Use: Never used  Substance and Sexual Activity   Alcohol use: No    Alcohol/week: 0.0 standard drinks of alcohol   Drug use: Never   Sexual activity: Not Currently  Other Topics Concern   Not on file  Social History Narrative   Daughter [name] ([place]); 1  in [employer]; 1 in [place] alone   Social Determinants of Health   Financial Resource Strain: [place]  (09/22/2017)   Overall Financial Resource Strain (CARDIA)    Difficulty of Paying Living Expenses: Not hard at all  Food Insecurity: No Food Insecurity (08/23/2022)   Hunger Vital Sign    Worried About Running Out of Food in the Last Year: Never true    [place] in the Last Year: Never true  Transportation Needs: No Transportation Needs (08/23/2022)   PRAPARE - Hydrologist (Medical): No    Lack of Transportation (Non-Medical): No  Physical Activity: Inactive (09/22/2017)   Exercise Vital Sign    Days of Exercise per Week: 0 days    Minutes of Exercise per Session: 0 min  Stress: Unknown (09/22/2017)   [place]    Feeling of Stress : Patient declined  Social Connections: Unknown (09/22/2017)   Social Connection and Isolation Panel [NHANES]    Frequency of Communication with Friends and Family: Patient declined    Frequency of Social Gatherings with Friends and Family: Patient declined    Attends Religious Services: Patient declined    Marine scientist or Organizations: Patient declined    Attends Archivist Meetings: Patient declined    Marital Status: Patient declined  Intimate Partner Violence: Not At Risk (09/22/2017)   Humiliation, Afraid, Rape, and Kick questionnaire    Fear of Current or Ex-Partner: No    Emotionally Abused: No    Physically Abused: No    Sexually Abused: No    Family History  Problem Relation Age of Onset   Stroke Mother    Hypertension Mother    Diabetes Mother    Hypertension Father    Heart disease Sister        MI   Multiple sclerosis Daughter    Multiple sclerosis Son    Cerebral aneurysm Son    Seizures Son    Cerebral aneurysm Son    Breast cancer Paternal Aunt 34    Current Outpatient Medications   Medication Sig Dispense Refill   albuterol (VENTOLIN HFA) 108 (90 Base) MCG/ACT inhaler Inhale 2 puffs into the lungs every 6 (six) hours as needed for wheezing or shortness of breath. 8 g 0   amLODipine (NORVASC) 10 MG tablet Take 1 tablet (10 mg total) by mouth at bedtime. 90 tablet 1   aspirin 81 MG tablet Take 81 mg by mouth daily.     chlorpheniramine-HYDROcodone (TUSSIONEX) 10-8 MG/5ML Take 5 mLs by mouth at bedtime as needed for cough. 35 mL 0   clopidogrel (PLAVIX) 75 MG tablet Take 1 tablet (75 mg total) by mouth at  bedtime. To prevent strokes 90 tablet 1   ezetimibe (ZETIA) 10 MG tablet Take 1 tablet (10 mg total) by mouth at bedtime. 90 tablet 3   ferrous sulfate 325 (65 FE) MG tablet Take 1 tablet (325 mg total) by mouth every other day. 45 tablet 0   fluconazole (DIFLUCAN) 150 MG tablet ONE TABLET ONCE A WEEK FOR 4 WEEKS FOR FUNGAL RASH 4 tablet 0   HYDROcodone-acetaminophen (NORCO) 10-325 MG tablet Take 1 tablet by mouth every 6 (six) hours as needed for moderate pain. 90 tablet 0   labetalol (NORMODYNE) 300 MG tablet Take 1 tablet (300 mg total) by mouth 2 (two) times daily. For hypertension 180 tablet 1   losartan (COZAAR) 50 MG tablet TAKE 1 TABLET BY MOUTH AT BEDTIME 90 tablet 1   nortriptyline (PAMELOR) 10 MG capsule Take 1 capsule (10 mg total) by mouth 2 (two) times daily. For chronic headaches 180 capsule 3   pantoprazole (PROTONIX) 40 MG tablet One tablet upon waking,  take 30 minuts prior to eating 90 tablet 3   ranolazine (RANEXA) 500 MG 12 hr tablet Take 1 tablet (500 mg total) by mouth 2 (two) times daily. 60 tablet 3   triamcinolone cream (KENALOG) 0.1 % Apply 1 Application topically 2 (two) times daily. To all areas of rash 453.6 g 0   No current facility-administered medications for this visit.    Allergies  Allergen Reactions   Citalopram Anaphylaxis    Throat closing    Dilaudid [Hydromorphone Hcl] Nausea And Vomiting   Hydrochlorothiazide Other (See  Comments)    Decreased GFR (Nov 2015)   Liothyronine     Hair fell out, caused headaches    Nsaids     CKD stage III - avoid nephrotoxic drugs   Nubain [Nalbuphine Hcl]     Burning sensation in back   Penicillins Itching   Prasugrel Itching   Statins Itching     REVIEW OF SYSTEMS:   [X]  denotes positive finding, [ ]  denotes negative finding Cardiac  Comments:  Chest pain or chest pressure:    Shortness of breath upon exertion:    Short of breath when lying flat:    Irregular heart rhythm:        Vascular    Pain in calf, thigh, or hip brought on by ambulation:    Pain in feet at night that wakes you up from your sleep:     Blood clot in your veins:    Leg swelling:         Pulmonary    Oxygen at home:    Productive cough:     Wheezing:         Neurologic    Sudden weakness in arms or legs:     Sudden numbness in arms or legs:     Sudden onset of difficulty speaking or slurred speech:    Temporary loss of vision in one eye:     Problems with dizziness:         Gastrointestinal    Blood in stool:     Vomited blood:         Genitourinary    Burning when urinating:     Blood in urine:        Psychiatric    Major depression:         Hematologic    Bleeding problems:    Problems with blood clotting too easily:        Skin  Rashes or ulcers:        Constitutional    Fever or chills:      PHYSICAL EXAMINATION:  Vitals:   01/14/23 1024 01/14/23 1026  BP: (!) 171/78 (!) 178/77  Pulse: 74   Resp: 20   Temp: 98.4 F (36.9 C)   SpO2: 97%   Weight: 164 lb (74.4 kg)   Height: 5\' 1"  (1.549 m)     General:  WDWN in NAD; vital signs documented above Gait: Not observed HENT: WNL, normocephalic Pulmonary: normal non-labored breathing , without wheezing Cardiac: regular HR, Abdomen: soft, NT, no masses Skin: without rashes Vascular Exam/Pulses:  Right Left  Radial 2+ (normal) 2+ (normal)  Ulnar 2+ (normal) 2+ (normal)  Femoral    Popliteal    DP  nonpalpable nonpalpable  PT nonpalpable nonpalpable   Extremities: without ischemic changes, without Gangrene , without cellulitis; without open wounds;  Right groin wound with 3 small areas opened, probe less than 1 cm, Musculoskeletal: no muscle wasting or atrophy  Neurologic: A&O X 3;  No focal weakness or paresthesias are detected Psychiatric:  The pt has Normal affect.   Non-Invasive Vascular Imaging:    Summary:  Right Carotid: Evidence consistent with a total occlusion of the right  ICA.   Left Carotid: Velocities in the left ICA are consistent with a 1-39%  stenosis.   +-------+-----------+-----------+------------+------------+  ABI/TBIToday's ABIToday's TBIPrevious ABIPrevious TBI  +-------+-----------+-----------+------------+------------+  Right 0.54       0.39       0.53        0.34          +-------+-----------+-----------+------------+------------+  Left  0.59       0.43       0.63        0.38          +-------+-----------+-----------+------------+------------+   Abdominal Aorta Findings:  +-------------+-------+----------+----------+----------+--------+--------+  Location    AP (cm)Trans (cm)PSV (cm/s)Waveform  ThrombusComments  +-------------+-------+----------+----------+----------+--------+--------+  Proximal                     107                                   +-------------+-------+----------+----------+----------+--------+--------+  Mid         3.03   3.34      98                          fusiform  +-------------+-------+----------+----------+----------+--------+--------+  Distal                       100                                   +-------------+-------+----------+----------+----------+--------+--------+  RT CIA Prox                   181       monophasic                  +-------------+-------+----------+----------+----------+--------+--------+  RT CIA Mid                    147        monophasic                  +-------------+-------+----------+----------+----------+--------+--------+  RT CIA Distal                 312       monophasic                  +-------------+-------+----------+----------+----------+--------+--------+  RT EIA Prox                   291       monophasic                  +-------------+-------+----------+----------+----------+--------+--------+  RT EIA Mid                    256       monophasic                  +-------------+-------+----------+----------+----------+--------+--------+  RT EIA Distal                 147       monophasic                  +-------------+-------+----------+----------+----------+--------+--------+  LT CIA Prox                   64        monophasic                  +-------------+-------+----------+----------+----------+--------+--------+  LT CIA Mid                    92        monophasic                  +-------------+-------+----------+----------+----------+--------+--------+  LT CIA Distal                 78        monophasic                  +-------------+-------+----------+----------+----------+--------+--------+     Left Stent(s):  +---------------+--------+---------------+----------+--------+  EIA           PSV cm/sStenosis       Waveform  Comments  +---------------+--------+---------------+----------+--------+  Prox to Stent  184                    monophasic          +---------------+--------+---------------+----------+--------+  Proximal Stent 237     50-99% stenosisbiphasic            +---------------+--------+---------------+----------+--------+  Mid Stent      287     50-99% stenosisbiphasic            +---------------+--------+---------------+----------+--------+  Distal Stent   122                    biphasic            +---------------+--------+---------------+----------+--------+  Distal to Stent136                     biphasic            +---------------+--------+---------------+----------+--------+    ASSESSMENT/PLAN: Saidah A Gatt is a 82 y.o. female presenting in follow up s/p right groin exploration, redo CFA patch plasty with ipsilateral GSV.  In December 2023 she underwent right-sided EIA drug-coated balloon angioplasty, bilateral renal artery angioplasty for assisted primary patency of bilateral renal artery stents.  Madline has undergone multiple lower extremity operations-see above.  Ultrasound today demonstrated widely patent renal arteries, small infrarenal abdominal aneurysm.  Stenosis of the SMA, however she is asymptomatic.  Widely patent bilateral iliac artery stents.  There is some stenosis in the distal portion of the right common iliac artery.  ABI unchanged. On physical exam, she had readily palpable pulses in bilateral groins.  I had a long discussion with her regarding the above.  With the extent of stenting she has undergone, we will need to continue to follow her with every 3 month visits.  I elected to hold on further examination of the right common iliac artery, as it was widely patent when last assessed 3 months ago.  The proximal right external iliac underwent balloon angioplasty at that time.  If velocities prove higher at her next visit, we discussed repeat angiography for assisted primary patency of her previously placed stents.  Plan for three month follow up with repeat aortoiliac ultrasound. ABI. Renal and carotid studies will be performed yearly.     Broadus John, MD [facility] [phone]  Total time of patient care including pre-visit research, consultation, and documentation greater than 40 minutes

## 2023-01-15 NOTE — Progress Notes (Incomplete)
Office Note    HPI: Sabrina [name] is a 82 y.o. (1941/04/16) female presenting in follow up s/p right groin exploration, redo CFA patch plasty with ipsilateral GSV on 03/30/22.  Prior surgeries were performed at [facility] and include: Right renal artery stenting 81mm left renal artery stenting (28mm) (2015) Left subclavian artery stenting 64mm (2023) Bilateral common iliac artery stenting Bilateral external iliac stenting Left common femoral, profunda femoris, and superficial femoral artery endarterectomies Right common femoral, profunda femoris, and superficial femoral artery endarterectomies Stent placement to left external iliac artery with 8 mm diameter by 4 cm length of life star stent ___  09/2022 balloon angioplasty of the right EIA, bilateral renal angioplasty   On exam today, Sabrina [name] was doing well. She noted some lower extremity swelling and tightness. This has improved significantly since stopping amlodipine.  She denied claudication, rest pain, ischemic tissue loss.   Denies food fear, weight loss.  Denies stroke, TIA, amaurosis symptoms. Denies abdominal pain, back   The pt is  on a daily aspirin.   Other AC:  plavix The pt is  on medication for hypertension.   The pt is not diabetic.  Tobacco hx:  -  Past Medical History:  Diagnosis Date  . 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 Amputation of fifth toe, right, traumatic, subsequent encounter ([place]) 06/18/2019  . Anemia of chronic kidney failure   . Anxiety   . Aortic stenosis 03/18/2020   a.) TTE 03/18/2020: EF >55%; mild AS (MPG 8.7 mmHg). b.) TTE 11/16/2021: EF >55%; mild AS (MPG 9 mmHg)  . CAD (coronary artery disease)    a.) s/p 3v CABG 03/29/2000  . Cardiac arrest ([place])   . Carotid artery stenosis    a.) s/p LEFT CEA 09/09/2003. b.) Carotid doppler  [id number]: [id number] LICA, CTO RICA; subclavian stenosis  . Cataracts, bilateral   . Cervical spondylosis without myelopathy   . Chronic diastolic CHF (congestive heart failure), NYHA class 3 (HCC)    a.) TTE 05/27/2016: EF >55%, mild LA enlargement, triv PR, mild MR, mod TR; G3DD. b.) TTE 12/12/2017: EF >55%, mild LVH, BAE, mild MR/PR, mod TR; RVSP 52.8 mmHg. c.) TTE 03/18/2020: EF >55%, BAD, AS (MPG 8.7 mmHg); triv MR, mild TR/PR. d.) TTE 11/16/2021: EF >55%, LVH, G1DD, triv MR, mild PR, mod TR; AS (MPG 9 mmHg); MS (MPG 5 mmHg)  . Chronic kidney disease, stage III (moderate) (HCC)   . Chronic narcotic use 06/24/2014  . Chronic pain syndrome   . GERD (gastroesophageal reflux disease)   . History of 2019 novel coronavirus disease (COVID-19) 09/22/2021  . Hyperlipidemia   . Hypertension   . Long term current use of antithrombotics/antiplatelets    a.) on daily DAPT therapy (ASA + clopidogrel)  . Lumbar stenosis with neurogenic claudication   . Mitral stenosis 11/16/2021   a.) TTE 11/16/2021: EF >55%; mod MS (MPG 5 mmHg)  . Osteoarthritis of hip   . Postoperative wound infection 03/29/2022  . Pulmonary hypertension ([place]) 12/12/2017   a.) TTE 12/12/2017: mild; RVSP 52.8 mmHg  . PVD (peripheral vascular disease) ([place])   . Renal artery stenosis ([place])   . 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 Secondary hyperparathyroidism ([place])   . SOB (shortness of breath)   . Subclavian arterial stenosis (HCC)  a.) s/p placement of 8.0 x 38 mm Lifestream stent to LEFT subclavian 11/23/2021.    Past Surgical History:  Procedure Laterality Date  . ABDOMINAL AORTOGRAM W/LOWER EXTREMITY N/A 10/06/2022   Procedure: ABDOMINAL AORTOGRAM W/LOWER EXTREMITY;  Surgeon: Broadus John, MD;  Location: [place] [facility];  Service: Cardiovascular;  Laterality: N/A;  . ABDOMINAL HYSTERECTOMY  1976  . CAROTID ARTERY ANGIOPLASTY Left   . CAROTID ENDARTERECTOMY Left 09/09/2003    Procedure: CAROTID ENDARTERECTOMY; Location: [facility]; Surgeon: Maura Crandall, MD  . COLONOSCOPY WITH PROPOFOL N/A 08/16/2017   Procedure: COLONOSCOPY WITH PROPOFOL;  Surgeon: [name], MD;  Location: [facility];  Service: Endoscopy;  Laterality: N/A;  . CORONARY ANGIOPLASTY WITH STENT PLACEMENT  2000  . CORONARY ARTERY BYPASS GRAFT N/A 03/29/2000   Procedure: 3v CORONARY ARTERY BYPASS GRAFT; Location: [facility]  . CYSTOSCOPY WITH STENT PLACEMENT Bilateral   . ENDARTERECTOMY FEMORAL Bilateral 03/17/2022   Procedure: ENDARTERECTOMY FEMORAL ( BILATERAL SFA STENT);  Surgeon: Katha Cabal, MD;  Location: [facility];  Service: Vascular;  Laterality: Bilateral;  . ENDARTERECTOMY FEMORAL Right 03/30/2022   Procedure: RE-EXPOSURE OF RIGHT COMMON FEMORAL ARTERY, RE-DO RIGHT FEMORAL ENDARTERECTOMY WITH VEIN PATCH;  Surgeon: Broadus John, MD;  Location: [place];  Service: Vascular;  Laterality: Right;  WOUND VAC  . ESOPHAGOGASTRODUODENOSCOPY (EGD) WITH PROPOFOL N/A 08/16/2017   Procedure: ESOPHAGOGASTRODUODENOSCOPY (EGD) WITH PROPOFOL;  Surgeon: [name], MD;  Location: [facility];  Service: Endoscopy;  Laterality: N/A;  . ESOPHAGOGASTRODUODENOSCOPY (EGD) WITH PROPOFOL N/A 06/29/2018   Procedure: ESOPHAGOGASTRODUODENOSCOPY (EGD) WITH PROPOFOL;  Surgeon: Virgel Manifold, MD;  Location: [facility];  Service: Endoscopy;  Laterality: N/A;  . GROIN DEBRIDEMENT Right 03/30/2022   Procedure: RIGHT GROIN IRRIGATION & DEBRIDEMENT;  Surgeon: Broadus John, MD;  Location: [place];  Service: Vascular;  Laterality: Right;  . INSERTION OF ILIAC STENT Left 03/17/2022   Procedure: INSERTION OF ILIAC STENT;  Surgeon: Katha Cabal, MD;  Location: [facility];  Service: Vascular;  Laterality: Left;  . LAPAROSCOPIC CHOLECYSTECTOMY Left 10/26/1999   Procedure: LAPAROSCOPIC CHOLECYSTECTOMY; Location: [facility]; Surgeon: Rochel Brome, MD  . LEFT HEART CATH AND CORONARY ANGIOGRAPHY N/A 11/18/2021   Procedure:  LEFT HEART CATH AND CORONARY ANGIOGRAPHY;  Surgeon: Yolonda Kida, MD;  Location: [place] [facility];  Service: Cardiovascular;  Laterality: N/A;  . LEFT HEART CATH AND CORS/GRAFTS ANGIOGRAPHY Left 11/19/2002   Procedure: LEFT HEART CATH AND CORS/GRAFTS ANGIOGRAPHY; Location: [place]; Surgeon: Katrine Coho, MD  . LEFT HEART CATH AND CORS/GRAFTS ANGIOGRAPHY Left 09/17/2003   Procedure: LEFT HEART CATH AND CORS/GRAFTS ANGIOGRAPHY; Location: [place]; Surgeon: Katrine Coho, MD  . LOWER EXTREMITY ANGIOGRAPHY Right 01/06/2022   Procedure: Lower Extremity Angiography;  Surgeon: Katha Cabal, MD;  Location: [place] [facility];  Service: Cardiovascular;  Laterality: Right;  . PERIPHERAL VASCULAR BALLOON ANGIOPLASTY  10/06/2022   Procedure: PERIPHERAL VASCULAR BALLOON ANGIOPLASTY;  Surgeon: Broadus John, MD;  Location: [place] [facility];  Service: Cardiovascular;;  left iliac and bilateral renal  . RENAL ARTERY ANGIOPLASTY Bilateral 12/2013  . TOE AMPUTATION Right    small toe  . TONSILLECTOMY AND ADENOIDECTOMY    . TOTAL HIP ARTHROPLASTY Left 2005  . TOTAL HIP ARTHROPLASTY Right 2015  . UPPER EXTREMITY ANGIOGRAPHY Left 11/23/2021   Procedure: UPPER EXTREMITY ANGIOGRAPHY;  Surgeon: Algernon Huxley, MD;  Location: [place] [facility];  Service: Cardiovascular;  Laterality: Left;    Social History   Socioeconomic History  . Marital status: Divorced  Spouse name: Not on file  . Number of children: 3  . Years of education: Not on file  . Highest education level: Not on file  Occupational History  . Occupation: retired  Tobacco Use  . Smoking status: Former    Types: Cigarettes    Quit date: 10/25/1998    Years since quitting: 24.2  . Smokeless tobacco: Never  Vaping Use  . Vaping Use: Never used  Substance and Sexual Activity  . Alcohol use: No    Alcohol/week: 0.0 standard drinks of alcohol  . Drug use: Never  . Sexual activity: Not Currently  Other Topics Concern  .  Not on file  Social History Narrative   Daughter [name] ([place]); 1 in [employer]; 1 in [place]   Lives alone   Social Determinants of Health   Financial Resource Strain: Low Risk  (09/22/2017)   Overall Financial Resource Strain (CARDIA)   . Difficulty of Paying Living Expenses: Not hard at all  Food Insecurity: No Food Insecurity (08/23/2022)   Hunger Vital Sign   . Worried About Charity fundraiser in the Last Year: Never true   . Ran Out of Food in the Last Year: Never true  Transportation Needs: No Transportation Needs (08/23/2022)   PRAPARE - Transportation   . Lack of Transportation (Medical): No   . Lack of Transportation (Non-Medical): No  Physical Activity: Inactive (09/22/2017)   Exercise Vital Sign   . Days of Exercise per Week: 0 days   . Minutes of Exercise per Session: 0 min  Stress: Unknown (09/22/2017)   [place]   . Feeling of Stress : Patient declined  Social Connections: Unknown (09/22/2017)   Social Connection and Isolation Panel [NHANES]   . Frequency of Communication with Friends and Family: Patient declined   . Frequency of Social Gatherings with Friends and Family: Patient declined   . Attends Religious Services: Patient declined   . Active Member of Clubs or Organizations: Patient declined   . Attends Archivist Meetings: Patient declined   . Marital Status: Patient declined  Intimate Partner Violence: Not At Risk (09/22/2017)   Humiliation, Afraid, Rape, and Kick questionnaire   . Fear of Current or Ex-Partner: No   . Emotionally Abused: No   . Physically Abused: No   . Sexually Abused: No    Family History  Problem Relation Age of Onset  . Stroke Mother   . Hypertension Mother   . Diabetes Mother   . Hypertension Father   . Heart disease Sister        MI  . Multiple sclerosis Daughter   . Multiple sclerosis Son   . Cerebral aneurysm Son   . Seizures Son   .  Cerebral aneurysm Son   . Breast cancer Paternal Aunt 17    Current Outpatient Medications  Medication Sig Dispense Refill  . albuterol (VENTOLIN HFA) 108 (90 Base) MCG/ACT inhaler Inhale 2 puffs into the lungs every 6 (six) hours as needed for wheezing or shortness of breath. 8 g 0  . amLODipine (NORVASC) 10 MG tablet Take 1 tablet (10 mg total) by mouth at bedtime. 90 tablet 1  . aspirin 81 MG tablet Take 81 mg by mouth daily.    . chlorpheniramine-HYDROcodone (TUSSIONEX) 10-8 MG/5ML Take 5 mLs by mouth at bedtime as needed for cough. 35 mL 0  . clopidogrel (PLAVIX) 75 MG tablet Take 1 tablet (75 mg total) by mouth at  bedtime. To prevent strokes 90 tablet 1  . ezetimibe (ZETIA) 10 MG tablet Take 1 tablet (10 mg total) by mouth at bedtime. 90 tablet 3  . ferrous sulfate 325 (65 FE) MG tablet Take 1 tablet (325 mg total) by mouth every other day. 45 tablet 0  . fluconazole (DIFLUCAN) 150 MG tablet ONE TABLET ONCE A WEEK FOR 4 WEEKS FOR FUNGAL RASH 4 tablet 0  . HYDROcodone-acetaminophen (NORCO) 10-325 MG tablet Take 1 tablet by mouth every 6 (six) hours as needed for moderate pain. 90 tablet 0  . labetalol (NORMODYNE) 300 MG tablet Take 1 tablet (300 mg total) by mouth 2 (two) times daily. For hypertension 180 tablet 1  . losartan (COZAAR) 50 MG tablet TAKE 1 TABLET BY MOUTH AT BEDTIME 90 tablet 1  . nortriptyline (PAMELOR) 10 MG capsule Take 1 capsule (10 mg total) by mouth 2 (two) times daily. For chronic headaches 180 capsule 3  . pantoprazole (PROTONIX) 40 MG tablet One tablet upon waking,  take 30 minuts prior to eating 90 tablet 3  . ranolazine (RANEXA) 500 MG 12 hr tablet Take 1 tablet (500 mg total) by mouth 2 (two) times daily. 60 tablet 3  . triamcinolone cream (KENALOG) 0.1 % Apply 1 Application topically 2 (two) times daily. To all areas of rash 453.6 g 0   No current facility-administered medications for this visit.    Allergies  Allergen Reactions  . Citalopram Anaphylaxis     Throat closing   . Dilaudid [Hydromorphone Hcl] Nausea And Vomiting  . Hydrochlorothiazide Other (See Comments)    Decreased GFR (Nov 2015)  . Liothyronine     Hair fell out, caused headaches   . Nsaids     CKD stage III - avoid nephrotoxic drugs  . Nubain [Nalbuphine Hcl]     Burning sensation in back  . Penicillins Itching  . Prasugrel Itching  . Statins Itching     REVIEW OF SYSTEMS:   [X]  denotes positive finding, [ ]  denotes negative finding Cardiac  Comments:  Chest pain or chest pressure:    Shortness of breath upon exertion:    Short of breath when lying flat:    Irregular heart rhythm:        Vascular    Pain in calf, thigh, or hip brought on by ambulation:    Pain in feet at night that wakes you up from your sleep:     Blood clot in your veins:    Leg swelling:         Pulmonary    Oxygen at home:    Productive cough:     Wheezing:         Neurologic    Sudden weakness in arms or legs:     Sudden numbness in arms or legs:     Sudden onset of difficulty speaking or slurred speech:    Temporary loss of vision in one eye:     Problems with dizziness:         Gastrointestinal    Blood in stool:     Vomited blood:         Genitourinary    Burning when urinating:     Blood in urine:        Psychiatric    Major depression:         Hematologic    Bleeding problems:    Problems with blood clotting too easily:        Skin  Rashes or ulcers:        Constitutional    Fever or chills:      PHYSICAL EXAMINATION:  Vitals:   01/14/23 1024 01/14/23 1026  BP: (!) 171/78 (!) 178/77  Pulse: 74   Resp: 20   Temp: 98.4 F (36.9 C)   SpO2: 97%   Weight: 164 lb (74.4 kg)   Height: 5\' 1"  (1.549 m)     General:  WDWN in NAD; vital signs documented above Gait: Not observed HENT: WNL, normocephalic Pulmonary: normal non-labored breathing , without wheezing Cardiac: regular HR, Abdomen: soft, NT, no masses Skin: without rashes Vascular  Exam/Pulses:  Right Left  Radial 2+ (normal) 2+ (normal)  Ulnar 2+ (normal) 2+ (normal)  Femoral    Popliteal    DP nonpalpable nonpalpable  PT nonpalpable nonpalpable   Extremities: without ischemic changes, without Gangrene , without cellulitis; without open wounds;  Right groin wound with 3 small areas opened, probe less than 1 cm, Musculoskeletal: no muscle wasting or atrophy  Neurologic: A&O X 3;  No focal weakness or paresthesias are detected Psychiatric:  The pt has Normal affect.   Non-Invasive Vascular Imaging:       ASSESSMENT/PLAN: Sabrina [name] is a 82 y.o. female presenting in follow up s/p right groin exploration, redo CFA patch plasty with ipsilateral GSV.    Susan has undergone multiple lower extremity operations-see above.  Ultrasound imaging today demonstrated elevated velocities at both the right external and left external arteries.  The left external artery has a previously placed stent.  I have concern for in-stent restenosis and discussed bilateral lower extremity angiography to define, and possibly treat areas of inflow stenosis for assisted primary patency, iliac artery stents, left-sided external iliac stent.  After discussing the risk and benefits, Patricie elected to proceed.  I think the safest access for diagnostic angiogram with possible intervention is the left common femoral artery.  I asked that she continue her current medication regimen. Velvia has multiple vascular beds, including the left subclavian, bilateral common iliac arteries, left external iliac artery, bilateral renal arteries. Also right ICA occlusion. At the time of her next visit, she will need carotid duplex ultrasound, bilateral renal artery ultrasound, aortoiliac duplex, ABIs   Broadus John, MD [facility] [phone]

## 2023-01-17 ENCOUNTER — Encounter: Payer: Self-pay | Admitting: Internal Medicine

## 2023-01-19 ENCOUNTER — Other Ambulatory Visit: Payer: Self-pay

## 2023-01-19 DIAGNOSIS — I7143 Infrarenal abdominal aortic aneurysm, without rupture: Secondary | ICD-10-CM

## 2023-01-19 DIAGNOSIS — Z9889 Other specified postprocedural states: Secondary | ICD-10-CM

## 2023-01-19 DIAGNOSIS — I6523 Occlusion and stenosis of bilateral carotid arteries: Secondary | ICD-10-CM

## 2023-01-19 DIAGNOSIS — Z95828 Presence of other vascular implants and grafts: Secondary | ICD-10-CM

## 2023-02-14 ENCOUNTER — Encounter: Payer: Self-pay | Admitting: Family Medicine

## 2023-02-14 ENCOUNTER — Ambulatory Visit (INDEPENDENT_AMBULATORY_CARE_PROVIDER_SITE_OTHER): Payer: Medicare Other | Admitting: Family Medicine

## 2023-02-14 VITALS — BP 136/82 | HR 81 | Temp 98.4°F | Ht 61.0 in | Wt 167.2 lb

## 2023-02-14 DIAGNOSIS — M7542 Impingement syndrome of left shoulder: Secondary | ICD-10-CM

## 2023-02-14 DIAGNOSIS — R202 Paresthesia of skin: Secondary | ICD-10-CM | POA: Diagnosis not present

## 2023-02-14 DIAGNOSIS — G8929 Other chronic pain: Secondary | ICD-10-CM | POA: Diagnosis not present

## 2023-02-14 DIAGNOSIS — M5416 Radiculopathy, lumbar region: Secondary | ICD-10-CM

## 2023-02-14 MED ORDER — PREDNISONE 20 MG PO TABS: 40.0000 mg | ORAL_TABLET | Freq: Every day | ORAL | 0 refills | Status: DC

## 2023-02-14 NOTE — Assessment & Plan Note (Signed)
Chronic low back pain that seems to be worsening.  She has been following with her PCP for this.  I am going to refer her back to orthopedics to get their input as well.  I am prescribing her prednisone 40 mg daily for 5 days to see if that will help with some of her symptoms.  Discussed the pain into her legs could be nerve impingement from her back or could be occurring at her groin.

## 2023-02-14 NOTE — Patient Instructions (Signed)
Nice to see you. We are going to try prednisone to see if this will help with your symptoms.  If you have excessive trouble sleeping or excessive agitation with starting this please let us know. Neurology, orthopedics, and physical therapy should contact you to schedule visits.  If you do not hear from them in the next 1 to 2 weeks please let us know.

## 2023-02-14 NOTE — Progress Notes (Signed)
[name], MD Phone: [phone]  Sabrina [name] is a 82 y.o. female who presents today for same day visit.   Left shoulder/upper arm pain: This has been going on 3 to 4 weeks.  She notes no injury.  It mostly hurts her at night though sometimes hurts her during the day.  Notes it last for about 10 minutes and then goes away.  She has been taking some Tylenol with little benefit.  She notes when it does hurt at the 12/10.  Chronic low back pain: Patient notes this continues to bother her.  She has trouble walking for more than 10 minutes at a time without having to rest.  Leg pain: Patient notes lightninglike pain shooting down from her groin bilaterally into her thighs and down into her lower legs.  She notes this has been going on since she had stents put in her groin about a year ago.  She notes she got an infection and was in the ICU and then had a heart attack.  Since then she has had this pain in her legs.  Social History   Tobacco Use  Smoking Status Former   Types: Cigarettes   Quit date: 10/25/1998   Years since quitting: 24.3  Smokeless Tobacco Never    Current Outpatient Medications on File Prior to Visit  Medication Sig Dispense Refill   amLODipine (NORVASC) 10 MG tablet Take 1 tablet (10 mg total) by mouth at bedtime. 90 tablet 1   aspirin 81 MG tablet Take 81 mg by mouth daily.     clopidogrel (PLAVIX) 75 MG tablet Take 1 tablet (75 mg total) by mouth at bedtime. To prevent strokes 90 tablet 1   ezetimibe (ZETIA) 10 MG tablet Take 1 tablet (10 mg total) by mouth at bedtime. 90 tablet 3   fluconazole (DIFLUCAN) 150 MG tablet ONE TABLET ONCE A WEEK FOR 4 WEEKS FOR FUNGAL RASH 4 tablet 0   HYDROcodone-acetaminophen (NORCO) 10-325 MG tablet Take 1 tablet by mouth every 6 (six) hours as needed for moderate pain. 90 tablet 0   labetalol (NORMODYNE) 300 MG tablet Take 1 tablet (300 mg total) by mouth 2 (two) times daily. For hypertension 180 tablet 1   losartan  (COZAAR) 50 MG tablet TAKE 1 TABLET BY MOUTH AT BEDTIME 90 tablet 1   nortriptyline (PAMELOR) 10 MG capsule Take 1 capsule (10 mg total) by mouth 2 (two) times daily. For chronic headaches 180 capsule 3   pantoprazole (PROTONIX) 40 MG tablet One tablet upon waking,  take 30 minuts prior to eating 90 tablet 3   ranolazine (RANEXA) 500 MG 12 hr tablet Take 1 tablet (500 mg total) by mouth 2 (two) times daily. 60 tablet 3   triamcinolone cream (KENALOG) 0.1 % Apply 1 Application topically 2 (two) times daily. To all areas of rash 453.6 g 0   No current facility-administered medications on file prior to visit.     ROS see history of present illness  Objective  Physical Exam Vitals:   02/14/23 0922 02/14/23 0941  BP: (!) 142/80 136/82  Pulse: 81   Temp: 98.4 F (36.9 C)   SpO2: 99%     BP Readings from Last 3 Encounters:  02/14/23 136/82  01/14/23 (!) 178/77  11/12/22 124/78   Wt Readings from Last 3 Encounters:  02/14/23 167 lb 3.2 oz (75.8 kg)  01/14/23 164 lb (74.4 kg)  11/12/22 161 lb 3.2 oz (73.1 kg)    Physical Exam Constitutional:  General: She is not in acute distress. Pulmonary:     Effort: Pulmonary effort is normal.  Musculoskeletal:     Comments: No midline neck tenderness, no midline neck step-off, no bony or muscular shoulder or upper back tenderness, patient has reduced abduction and external rotation in her left shoulder, she has full range of motion in her right shoulder, she has discomfort in her left shoulder on active and passive abduction, internal rotation, and external rotation, positive empty can on the left, negative empty can on the right, negative speeds bilaterally  Neurological:     Mental Status: She is alert.     Comments: 5/5 strength in bilateral biceps, triceps, grip, quads, hamstrings, plantar and dorsiflexion, sensation to light touch slightly reduced in her anterior thighs bilaterally, otherwise intact in bilateral UE and LE       Assessment/Plan: Please see individual problem list.  Rotator cuff impingement syndrome of left shoulder Assessment & Plan: I suspect this is the cause of her symptoms in her left shoulder.  We will give her a short course of prednisone 40 mg daily for 5 days to see if this will help with this and her other issues that were discussed today.  I am going to refer to orthopedics and physical therapy.  Discussed that she likely has some measure of frozen shoulder as well.  Orders: -     predniSONE; Take 2 tablets (40 mg total) by mouth daily with breakfast.  Dispense: 10 tablet; Refill: 0 -     Ambulatory referral to Orthopedic Surgery -     Ambulatory referral to Physical Therapy  Chronic radicular lumbar pain Assessment & Plan: Chronic low back pain that seems to be worsening.  She has been following with her PCP for this.  I am going to refer her back to orthopedics to get their input as well.  I am prescribing her prednisone 40 mg daily for 5 days to see if that will help with some of her symptoms.  Discussed the pain into her legs could be nerve impingement from her back or could be occurring at her groin.  Orders: -     predniSONE; Take 2 tablets (40 mg total) by mouth daily with breakfast.  Dispense: 10 tablet; Refill: 0 -     Ambulatory referral to Orthopedic Surgery -     Ambulatory referral to Physical Therapy  Paresthesia Assessment & Plan: Chronic issue.  Paresthesias radiating into her legs from her groin.  Discussed seeing neurology to see if she needs nerve conduction studies to determine the location of her possible nerve impingement.  Referral placed.  Orders: -     Ambulatory referral to Neurology    Return in about 3 months (around 05/16/2023), or if symptoms worsen or fail to improve, for with PCP for follow-up on pain.   [name], MD [facility]

## 2023-02-14 NOTE — Assessment & Plan Note (Signed)
Chronic issue.  Paresthesias radiating into her legs from her groin.  Discussed seeing neurology to see if she needs nerve conduction studies to determine the location of her possible nerve impingement.  Referral placed.

## 2023-02-14 NOTE — Assessment & Plan Note (Signed)
I suspect this is the cause of her symptoms in her left shoulder.  We will give her a short course of prednisone 40 mg daily for 5 days to see if this will help with this and her other issues that were discussed today.  I am going to refer to orthopedics and physical therapy.  Discussed that she likely has some measure of frozen shoulder as well.

## 2023-02-15 ENCOUNTER — Telehealth: Payer: Self-pay | Admitting: Internal Medicine

## 2023-02-15 DIAGNOSIS — H532 Diplopia: Secondary | ICD-10-CM | POA: Diagnosis not present

## 2023-02-15 DIAGNOSIS — H539 Unspecified visual disturbance: Secondary | ICD-10-CM

## 2023-02-15 DIAGNOSIS — H538 Other visual disturbances: Secondary | ICD-10-CM | POA: Diagnosis not present

## 2023-02-15 NOTE — Telephone Encounter (Signed)
[facility] would like to talk to the provider about pt. If it is after 5 then call on the mobile # Dr.[facility]-[phone]

## 2023-02-15 NOTE — Telephone Encounter (Signed)
I HAVE SPOKEN WITH DR [facility].  PATIENT WAS RECENTLY SEEN AND REPORT TRANSIENT VISION LOSS LASTING 1-2 HOURS.  SHE NEEDS TO HAVE LABS DONE HERE ASAP,  AND AN MRI/MRA   ALL OF WHICH I HAVE ORDERED  PLEASE CALL PATIENT; THANKS

## 2023-02-16 NOTE — Telephone Encounter (Signed)
Mri/Mra has been scheduled and lab appt is scheduled for tomorrow.

## 2023-02-17 ENCOUNTER — Other Ambulatory Visit (INDEPENDENT_AMBULATORY_CARE_PROVIDER_SITE_OTHER): Payer: Medicare Other

## 2023-02-17 DIAGNOSIS — M25512 Pain in left shoulder: Secondary | ICD-10-CM | POA: Diagnosis not present

## 2023-02-17 DIAGNOSIS — M25819 Other specified joint disorders, unspecified shoulder: Secondary | ICD-10-CM | POA: Diagnosis not present

## 2023-02-17 DIAGNOSIS — S98131D Complete traumatic amputation of one right lesser toe, subsequent encounter: Secondary | ICD-10-CM | POA: Diagnosis not present

## 2023-02-17 DIAGNOSIS — R200 Anesthesia of skin: Secondary | ICD-10-CM | POA: Diagnosis not present

## 2023-02-17 DIAGNOSIS — H539 Unspecified visual disturbance: Secondary | ICD-10-CM | POA: Diagnosis not present

## 2023-02-17 DIAGNOSIS — R202 Paresthesia of skin: Secondary | ICD-10-CM | POA: Diagnosis not present

## 2023-02-17 DIAGNOSIS — M19012 Primary osteoarthritis, left shoulder: Secondary | ICD-10-CM | POA: Diagnosis not present

## 2023-02-18 ENCOUNTER — Encounter: Payer: Self-pay | Admitting: Internal Medicine

## 2023-02-18 ENCOUNTER — Other Ambulatory Visit: Payer: Self-pay | Admitting: Internal Medicine

## 2023-02-18 DIAGNOSIS — R700 Elevated erythrocyte sedimentation rate: Secondary | ICD-10-CM

## 2023-02-18 DIAGNOSIS — H53133 Sudden visual loss, bilateral: Secondary | ICD-10-CM

## 2023-02-18 DIAGNOSIS — H539 Unspecified visual disturbance: Secondary | ICD-10-CM | POA: Insufficient documentation

## 2023-02-18 LAB — BASIC METABOLIC PANEL
BUN: 27 mg/dL — ABNORMAL HIGH (ref 6–23)
CO2: 23 mEq/L (ref 19–32)
Calcium: 9.9 mg/dL (ref 8.4–10.5)
Chloride: 105 mEq/L (ref 96–112)
Creatinine, Ser: 1.67 mg/dL — ABNORMAL HIGH (ref 0.40–1.20)
GFR: 28.66 mL/min — ABNORMAL LOW (ref >60.00)
Glucose, Bld: 122 mg/dL — ABNORMAL HIGH (ref 70–99)
Potassium: 3.7 mEq/L (ref 3.5–5.1)
Sodium: 140 mEq/L (ref 135–145)

## 2023-02-18 LAB — CBC WITH DIFFERENTIAL/PLATELET
Basophils Absolute: 0.1 10*3/uL (ref 0.0–0.1)
Basophils Relative: 1.3 % (ref 0.0–3.0)
Eosinophils Absolute: 0.2 10*3/uL (ref 0.0–0.7)
Eosinophils Relative: 2 % (ref 0.0–5.0)
HCT: 35.5 % — ABNORMAL LOW (ref 36.0–46.0)
Hemoglobin: 12.1 g/dL (ref 12.0–15.0)
Lymphocytes Relative: 27.4 % (ref 12.0–46.0)
Lymphs Abs: 2.6 10*3/uL (ref 0.7–4.0)
MCHC: 33.9 g/dL (ref 30.0–36.0)
MCV: 86.6 fl (ref 78.0–100.0)
Monocytes Absolute: 0.6 10*3/uL (ref 0.1–1.0)
Monocytes Relative: 6.8 % (ref 3.0–12.0)
Neutro Abs: 5.9 10*3/uL (ref 1.4–7.7)
Neutrophils Relative %: 62.5 % (ref 43.0–77.0)
Platelets: 321 10*3/uL (ref 150.0–400.0)
RBC: 4.1 Mil/uL (ref 3.87–5.11)
RDW: 14.3 % (ref 11.5–15.5)
WBC: 9.5 10*3/uL (ref 4.0–10.5)

## 2023-02-18 LAB — C-REACTIVE PROTEIN: CRP: <1 mg/dL (ref 0.5–20.0)

## 2023-02-18 LAB — SEDIMENTATION RATE: Sed Rate: 53 mm/hr — ABNORMAL HIGH (ref 0–30)

## 2023-02-21 ENCOUNTER — Telehealth: Payer: Self-pay

## 2023-02-21 ENCOUNTER — Ambulatory Visit
Admission: RE | Admit: 2023-02-21 | Discharge: 2023-02-21 | Disposition: A | Discharge status: Home / Self Care | Payer: Medicare Other | Source: Ambulatory Visit | Attending: Internal Medicine | Admitting: Internal Medicine

## 2023-02-21 DIAGNOSIS — I679 Cerebrovascular disease, unspecified: Secondary | ICD-10-CM | POA: Insufficient documentation

## 2023-02-21 DIAGNOSIS — I6523 Occlusion and stenosis of bilateral carotid arteries: Secondary | ICD-10-CM | POA: Diagnosis not present

## 2023-02-21 DIAGNOSIS — H532 Diplopia: Secondary | ICD-10-CM | POA: Insufficient documentation

## 2023-02-21 NOTE — Telephone Encounter (Signed)
LMTCB in regards to lab results.  

## 2023-02-21 NOTE — Telephone Encounter (Signed)
-----   Message from Sherlene Shams, MD sent at 02/18/2023  1:41 PM EDT ----- The tests that her eye doctor wanted her to have were abnormal.  Her sedimentation rate is elevated.  I am referring her to a general surgeon for a temporal artery biopsy to rule out vasculitis.

## 2023-02-23 ENCOUNTER — Encounter: Payer: Self-pay | Admitting: Internal Medicine

## 2023-02-23 MED ORDER — LOSARTAN POTASSIUM 50 MG PO TABS: 50.0000 mg | ORAL_TABLET | Freq: Every day | ORAL | 1 refills | Status: DC

## 2023-02-24 ENCOUNTER — Telehealth: Payer: Self-pay | Admitting: Internal Medicine

## 2023-02-24 ENCOUNTER — Other Ambulatory Visit: Payer: Self-pay | Admitting: Internal Medicine

## 2023-02-24 DIAGNOSIS — M7542 Impingement syndrome of left shoulder: Secondary | ICD-10-CM

## 2023-02-24 DIAGNOSIS — I679 Cerebrovascular disease, unspecified: Secondary | ICD-10-CM | POA: Insufficient documentation

## 2023-02-24 DIAGNOSIS — G8929 Other chronic pain: Secondary | ICD-10-CM

## 2023-02-24 MED ORDER — PREDNISONE 20 MG PO TABS: 40.0000 mg | ORAL_TABLET | Freq: Every day | ORAL | 0 refills | Status: DC

## 2023-02-24 NOTE — Telephone Encounter (Signed)
Pt is aware per [name].

## 2023-02-24 NOTE — Telephone Encounter (Signed)
Patient called and stated she saw Dr Birdie Sons on 02/14/2023 and he prescribed  predniSONE (DELTASONE) 20 MG tablet . Patient is leaving at 7am tomorrow to fly out of town and was wondering if she could get a refill. She said the prednisone really helped her.

## 2023-03-04 DIAGNOSIS — M25819 Other specified joint disorders, unspecified shoulder: Secondary | ICD-10-CM | POA: Diagnosis not present

## 2023-03-04 DIAGNOSIS — M19012 Primary osteoarthritis, left shoulder: Secondary | ICD-10-CM | POA: Diagnosis not present

## 2023-03-08 DIAGNOSIS — M25819 Other specified joint disorders, unspecified shoulder: Secondary | ICD-10-CM | POA: Diagnosis not present

## 2023-03-08 DIAGNOSIS — M19012 Primary osteoarthritis, left shoulder: Secondary | ICD-10-CM | POA: Diagnosis not present

## 2023-03-09 ENCOUNTER — Ambulatory Visit: Payer: Medicare Other | Admitting: Surgery

## 2023-03-11 ENCOUNTER — Telehealth: Payer: Self-pay

## 2023-03-11 DIAGNOSIS — M19012 Primary osteoarthritis, left shoulder: Secondary | ICD-10-CM | POA: Diagnosis not present

## 2023-03-11 DIAGNOSIS — M25819 Other specified joint disorders, unspecified shoulder: Secondary | ICD-10-CM | POA: Diagnosis not present

## 2023-03-11 NOTE — Telephone Encounter (Signed)
Pt's daughter called requesting an earlier appt d/t pt c/o pain and leg swelling.  Reviewed pt's chart, returned call for clarification, no answer, lf vm asking if she'd be able to come in for [place] on a different day to be seen earlier. Gave information on after hours line and instructed to go to ED or urgent care if symptoms become unmanageable.

## 2023-03-15 DIAGNOSIS — M19012 Primary osteoarthritis, left shoulder: Secondary | ICD-10-CM | POA: Diagnosis not present

## 2023-03-15 DIAGNOSIS — M25819 Other specified joint disorders, unspecified shoulder: Secondary | ICD-10-CM | POA: Diagnosis not present

## 2023-03-17 DIAGNOSIS — M19012 Primary osteoarthritis, left shoulder: Secondary | ICD-10-CM | POA: Diagnosis not present

## 2023-03-17 DIAGNOSIS — M25819 Other specified joint disorders, unspecified shoulder: Secondary | ICD-10-CM | POA: Diagnosis not present

## 2023-03-22 ENCOUNTER — Telehealth: Payer: Self-pay | Admitting: Internal Medicine

## 2023-03-22 NOTE — Telephone Encounter (Signed)
Pt would like to be called regarding her medication 

## 2023-03-23 NOTE — Telephone Encounter (Signed)
Spoke with pt and she stated that she is having bilateral foot and leg swelling. Pt has been scheduled to see Claris Che on Friday. Pt has been advised that if she develops any SOBr, chest pain that she will need to be evaluated sooner than Friday. Pt gave a verbal understanding.

## 2023-03-24 DIAGNOSIS — M25819 Other specified joint disorders, unspecified shoulder: Secondary | ICD-10-CM | POA: Diagnosis not present

## 2023-03-24 DIAGNOSIS — M19012 Primary osteoarthritis, left shoulder: Secondary | ICD-10-CM | POA: Diagnosis not present

## 2023-03-25 ENCOUNTER — Ambulatory Visit (INDEPENDENT_AMBULATORY_CARE_PROVIDER_SITE_OTHER): Payer: Medicare Other

## 2023-03-25 ENCOUNTER — Ambulatory Visit (INDEPENDENT_AMBULATORY_CARE_PROVIDER_SITE_OTHER): Payer: Medicare Other | Admitting: Family

## 2023-03-25 ENCOUNTER — Encounter: Payer: Self-pay | Admitting: Family

## 2023-03-25 VITALS — BP 144/60 | HR 67 | Temp 98.1°F | Ht 61.0 in | Wt 168.8 lb

## 2023-03-25 DIAGNOSIS — M5416 Radiculopathy, lumbar region: Secondary | ICD-10-CM

## 2023-03-25 DIAGNOSIS — I130 Hypertensive heart and chronic kidney disease with heart failure and stage 1 through stage 4 chronic kidney disease, or unspecified chronic kidney disease: Secondary | ICD-10-CM | POA: Diagnosis not present

## 2023-03-25 DIAGNOSIS — K219 Gastro-esophageal reflux disease without esophagitis: Secondary | ICD-10-CM

## 2023-03-25 DIAGNOSIS — I10 Essential (primary) hypertension: Secondary | ICD-10-CM

## 2023-03-25 DIAGNOSIS — M545 Low back pain, unspecified: Secondary | ICD-10-CM | POA: Diagnosis not present

## 2023-03-25 DIAGNOSIS — M25473 Effusion, unspecified ankle: Secondary | ICD-10-CM

## 2023-03-25 DIAGNOSIS — G8929 Other chronic pain: Secondary | ICD-10-CM

## 2023-03-25 DIAGNOSIS — M25552 Pain in left hip: Secondary | ICD-10-CM | POA: Diagnosis not present

## 2023-03-25 DIAGNOSIS — M25551 Pain in right hip: Secondary | ICD-10-CM | POA: Diagnosis not present

## 2023-03-25 DIAGNOSIS — M4316 Spondylolisthesis, lumbar region: Secondary | ICD-10-CM | POA: Diagnosis not present

## 2023-03-25 MED ORDER — CLOPIDOGREL BISULFATE 75 MG PO TABS
75.0000 mg | ORAL_TABLET | Freq: Every day | ORAL | 1 refills | Status: DC
Start: 2023-03-25 — End: 2023-05-12

## 2023-03-25 NOTE — Assessment & Plan Note (Addendum)
Uncontrolled. No swelling on exam today.  Patient appears euvolemic.  Discussed her conservative measures including compression stockings during the day, elevation and low-sodium diet.  Will follow

## 2023-03-25 NOTE — Patient Instructions (Signed)
Please start by scheduling Tylenol arthritis which is a 650 mg tablet in the morning and 8 hours later in the late afternoon or evening.  Please purchase over-the-counter Salonpas pain patch for pain.   For leg swelling, please start using compression stockings daily throughout the day.  Ensure that you are elevating your legs and following a low-sodium diet

## 2023-03-25 NOTE — Assessment & Plan Note (Addendum)
Uncontrolled. Patient is scheduled see Dr. Malvin Johns, neurology, for paresthesias.  Updating lumbar bilateral hip x-rays today.  Patient has significant history of peripheral artery disease and question if low back and leg pain driven by stenosis, degenerative changes lumbar spine /hip  versus PAD.  Discussed use of Tylenol arthritis 600 mg and advised her to schedule this medication in the morning and in the evening.  She is taking nortriptyline. We discussed OTC Salonpas pain patch.Consider gabapentin, Cymbalta. Order b12, TSH at follow up. Consider PT referral for low back and hip ( she is currently doing PT for left shoulder) after she has seen neurology.  Reiterated the importance of follow-up with vascular, Dr Karin Lieu. Close follow up.

## 2023-03-25 NOTE — Progress Notes (Signed)
Assessment & Plan:  Chronic radicular lumbar pain Assessment & Plan: Uncontrolled. Patient is scheduled see Dr. Malvin Johns, neurology, for paresthesias.  Updating lumbar bilateral hip x-rays today.  Patient has significant history of peripheral artery disease and question if low back and leg pain driven by stenosis, degenerative changes lumbar spine /hip  versus PAD.  Discussed use of Tylenol arthritis 600 mg and advised her to schedule this medication in the morning and in the evening.  She is taking nortriptyline. We discussed OTC Salonpas pain patch.Consider gabapentin, Cymbalta. Order b12, TSH at follow up. Consider PT referral for low back and hip ( she is currently doing PT for left shoulder) after she has seen neurology.  Reiterated the importance of follow-up with vascular, Dr Karin Lieu. Close follow up.   Orders: -     DG Lumbar Spine Complete; Future -     DG HIPS BILAT W OR W/O PELVIS 3-4 VIEWS; Future  Gastroesophageal reflux disease without esophagitis -     Clopidogrel Bisulfate; Take 1 tablet (75 mg total) by mouth at bedtime. 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 To prevent strokes  Dispense: 90 tablet; Refill: 1  Essential hypertension -     Clopidogrel Bisulfate; Take 1 tablet (75 mg total) by mouth at bedtime. To prevent strokes  Dispense: 90 tablet; Refill: 1  Ankle swelling, unspecified laterality Assessment & Plan: Uncontrolled. No swelling on exam today.  Patient appears euvolemic.  Discussed her conservative measures including compression stockings during the day, elevation and low-sodium diet.  Will follow      Return precautions given.   Risks, benefits, and alternatives of the medications and treatment plan prescribed today were discussed, and patient expressed understanding.   Education  regarding symptom management and diagnosis given to patient on AVS either electronically or printed.  Return in about 2 weeks (around 04/08/2023).  [name], FNP  Subjective:    Patient ID: Sabrina [name], female    DOB: 15-Jun-1941, 82 y.o.   MRN: [id number]  CC: Sabrina [name] is a 82 y.o. female who presents today for an acute visit.    HPI: Complains of bilateral leg swelling for over a year.  Worse after standing.  Improves when sleeping, elevates.  Swelling has gone 'down today'.  She uses sea salt on vegetable.   No cp, sob, orthopnea.   She is not wearing compression stockings.   She also complains of chronic low back pain and hip pain. She feels this was worse after LE stents placed 02/2023.   Worse with standing for long periods, cooking. No fever, urinary stool or incontinence, pain in calves with walking, weight loss. She has occasional numbness of right thigh.  She takes Tylenol 500 mg as needed without relief.  Previously seen 1 month ago by Dr Birdie Sons and prescribed prednisone for 5 days.  She was referred to neurology, Dr. Malvin Johns, scheduled 03/31/23.   History of coronary artery disease, chronic mesenteric ischemia, hypertension, subclavian artery stenosis, GERD E this is just, a CKD   echocardiogram 03/18/2022 left ventricular ejection fraction 60 to 65%.  Moderate left ventricular hypertrophy.    Crt 1.67  PAD , Dr Karin Lieu VAS ABI is abnormal, per Dr Karin Lieu note unchanged 01/14/23.  History of bilateral renal artery angioplasty  Follow up 04/22/23 Initial consult  to orthopedics, Dr.Menz, for left shoulder pain.  She is currently doing physical therapy for left shoulder pain  Allergies: Citalopram, Dilaudid [hydromorphone hcl], Hydrochlorothiazide, Liothyronine, Nsaids, Nubain [nalbuphine hcl], Penicillins, Prasugrel, and Statins Current Outpatient Medications on File Prior to Visit  Medication Sig Dispense Refill   amLODipine (NORVASC) 10 MG tablet  Take 1 tablet (10 mg total) by mouth at bedtime. 90 tablet 1   aspirin 81 MG tablet Take 81 mg by mouth daily.     ezetimibe (ZETIA) 10 MG tablet Take 1 tablet (10 mg total) by mouth at bedtime. 90 tablet 3   HYDROcodone-acetaminophen (NORCO) 10-325 MG tablet Take 1 tablet by mouth every 6 (six) hours as needed for moderate pain. 90 tablet 0   labetalol (NORMODYNE) 300 MG tablet Take 1 tablet (300 mg total) by mouth 2 (two) times daily. For hypertension 180 tablet 1   losartan (COZAAR) 50 MG tablet Take 1 tablet (50 mg total) by mouth at bedtime. 90 tablet 1   nortriptyline (PAMELOR) 10 MG capsule Take 1 capsule (10 mg total) by mouth 2 (two) times daily. For chronic headaches 180 capsule 3   pantoprazole (PROTONIX) 40 MG tablet One tablet upon waking,  take 30 minuts prior to eating 90 tablet 3   ranolazine (RANEXA) 500 MG 12 hr tablet Take 1 tablet (500 mg total) by mouth 2 (two) times daily. 60 tablet 3   triamcinolone cream (KENALOG) 0.1 % Apply 1 Application topically 2 (two) times daily. To all areas of rash 453.6 g 0   No current facility-administered medications on file prior to visit.    Review of Systems  Constitutional:  Negative for chills and fever.  Respiratory:  Negative for cough and shortness of breath.   Cardiovascular:  Positive for leg swelling. Negative for chest pain and palpitations.  Gastrointestinal:  Negative for nausea and vomiting.  Musculoskeletal:  Positive for back pain.  Neurological:  Positive for numbness.      Objective:    BP (!) 144/60   Pulse 67   Temp 98.1 F (36.7 C) (Oral)   Ht 5\' 1"  (1.549 m)   Wt 168 lb 12.8 oz (76.6 kg)   SpO2 99%   BMI 31.89 kg/m   BP Readings from Last 3 Encounters:  03/25/23 (!) 144/60  02/14/23 136/82  01/14/23 (!) 178/77   Wt Readings from Last 3 Encounters:  03/25/23 168 lb 12.8 oz (76.6 kg)  02/14/23 167 lb 3.2 oz (75.8 kg)  01/14/23 164 lb (74.4 kg)    Physical Exam Vitals reviewed.  Constitutional:       Appearance: She is well-developed.  Eyes:     Conjunctiva/sclera: Conjunctivae normal.  Cardiovascular:     Rate and Rhythm: Normal rate and regular rhythm.     Pulses: Normal pulses.     Heart sounds: Normal heart sounds.     Comments: No LE edema, palpable cords or masses. No erythema or increased warmth. No asymmetry in calf size when compared bilaterally  No discoloration or varicosities noted. LE warm .  Diminished pedal pulses bilaterally.  Pulmonary:     Effort: Pulmonary effort is normal.     Breath sounds: Normal breath sounds. No wheezing, rhonchi or rales.  Musculoskeletal:     Lumbar back: No swelling, edema, spasms, tenderness or bony tenderness. Normal range of motion.     Right lower leg: No edema.     Left lower leg: No edema.     Comments: Full range of motion  with flexion, tension, lateral side bends. No bony tenderness. No pain, numbness, tingling elicited with single leg raise bilaterally.    Skin:    General: Skin is warm and dry.  Neurological:     Mental Status: She is alert.     Sensory: No sensory deficit.     Deep Tendon Reflexes:     Reflex Scores:      Patellar reflexes are 2+ on the right side and 2+ on the left side.    Comments: Sensation and strength intact bilateral lower extremities. Strength 4/5 BLE.   Psychiatric:        Speech: Speech normal.        Behavior: Behavior normal.        Thought Content: Thought content normal.

## 2023-03-29 DIAGNOSIS — M19012 Primary osteoarthritis, left shoulder: Secondary | ICD-10-CM | POA: Diagnosis not present

## 2023-03-29 DIAGNOSIS — M25819 Other specified joint disorders, unspecified shoulder: Secondary | ICD-10-CM | POA: Diagnosis not present

## 2023-04-12 ENCOUNTER — Other Ambulatory Visit: Payer: Self-pay | Admitting: *Deleted

## 2023-04-12 DIAGNOSIS — I739 Peripheral vascular disease, unspecified: Secondary | ICD-10-CM

## 2023-04-12 DIAGNOSIS — I6523 Occlusion and stenosis of bilateral carotid arteries: Secondary | ICD-10-CM

## 2023-04-12 DIAGNOSIS — Z9889 Other specified postprocedural states: Secondary | ICD-10-CM

## 2023-04-13 ENCOUNTER — Ambulatory Visit (INDEPENDENT_AMBULATORY_CARE_PROVIDER_SITE_OTHER): Payer: Medicare Other | Admitting: Family

## 2023-04-13 ENCOUNTER — Encounter: Payer: Self-pay | Admitting: Family

## 2023-04-13 VITALS — BP 138/80 | HR 72 | Temp 98.3°F | Ht 61.0 in | Wt 170.0 lb

## 2023-04-13 DIAGNOSIS — I10 Essential (primary) hypertension: Secondary | ICD-10-CM

## 2023-04-13 DIAGNOSIS — M48062 Spinal stenosis, lumbar region with neurogenic claudication: Secondary | ICD-10-CM

## 2023-04-13 DIAGNOSIS — M47816 Spondylosis without myelopathy or radiculopathy, lumbar region: Secondary | ICD-10-CM | POA: Diagnosis not present

## 2023-04-13 MED ORDER — DULOXETINE HCL 30 MG PO CPEP
30.0000 mg | ORAL_CAPSULE | Freq: Every day | ORAL | 1 refills | Status: DC
Start: 2023-04-13 — End: 2023-10-05

## 2023-04-13 NOTE — Progress Notes (Signed)
Assessment & Plan:  Lumbar facet arthropathy Assessment & Plan: Chronic, uncontrolled.  Creatinine clearance 32 mL/min.  I opted not to start tizanidine in the setting of CKD.  We discussed trial of Cymbalta.  I also encouraged her to trial  Tylenol 500 mg in the morning and then again at night. Of note:  I am not sure why she experienced palpitations on Tylenol, this is resolved and not sure if more of a coincidence and side effects.  Encouraged her to stay in front of the pain and to schedule Tylenol extra strength.  She declines second opinion with orthopedics to consider joint injection . close follow-up  Orders: -     B12 and Folate Panel -     TSH -     DULoxetine HCl; Take 1 capsule (30 mg total) by mouth daily.  Dispense: 90 capsule; Refill: 1  Lumbar stenosis with neurogenic claudication  Essential hypertension -     TSH     Return precautions given.   Risks, benefits, and alternatives of the medications and treatment plan prescribed today were discussed, and patient expressed understanding.   Education regarding symptom management and diagnosis given to patient on AVS either electronically or printed.  Return in about 2 weeks (around 04/27/2023).  [name], FNP  Subjective:    Patient ID: Sabrina [name], female    DOB: 1941-06-06, 82 y.o.   MRN: [id number]  CC: Sabrina [name] is a 82 y.o. female who presents today for follow up.   HPI: 2-week follow-up for chronic low back pain.    Low back pain remains across the entirety of low back.  No pain in her legs or numbness in her legs      she took 1 dose of Tylenol arthritis and felt heart palpitations.  This is since resolved.  She does not feel heart palpitations other doses of Tylenol.  She has not been taking Tylenol regularly.  She avoids NSAIDs  Occasional numbness in her fingers. appointment with Dr. Malvin Johns neurology 04/25/23 Allergies: Citalopram, Dilaudid [hydromorphone hcl],  Hydrochlorothiazide, Liothyronine, Nsaids, Nubain [nalbuphine hcl], Penicillins, Prasugrel, and Statins Current Outpatient Medications on File Prior to Visit  Medication Sig Dispense Refill   amLODipine (NORVASC) 10 MG tablet Take 1 tablet (10 mg total) by mouth at bedtime. 90 tablet 1   aspirin 81 MG tablet Take 81 mg by mouth daily.     clopidogrel (PLAVIX) 75 MG tablet Take 1 tablet (75 mg total) by mouth at bedtime. To prevent strokes 90 tablet 1   ezetimibe (ZETIA) 10 MG tablet Take 1 tablet (10 mg total) by mouth at bedtime. 90 tablet 3   HYDROcodone-acetaminophen (NORCO) 10-325 MG tablet Take 1 tablet by mouth every 6 (six) hours as needed for moderate pain. 90 tablet 0   labetalol (NORMODYNE) 300 MG tablet Take 1 tablet (300 mg total) by mouth 2 (two) times daily. For hypertension 180 tablet 1   losartan (COZAAR) 50 MG tablet Take 1 tablet (50 mg total) by mouth at bedtime. 90 tablet 1   nortriptyline (PAMELOR) 10 MG capsule Take 1 capsule (10 mg total) by mouth 2 (two) times daily. For chronic headaches 180 capsule 3   pantoprazole (PROTONIX) 40 MG tablet One tablet upon waking,  take 30 minuts prior to eating 90 tablet 3   ranolazine (RANEXA) 500 MG 12 hr tablet Take 1 tablet (500 mg total) by mouth 2 (two) times daily. 60 tablet 3   triamcinolone cream (KENALOG) 0.1 %  Apply 1 Application topically 2 (two) times daily. To all areas of rash 453.6 g 0   No current facility-administered medications on file prior to visit.    Review of Systems  Constitutional:  Negative for chills and fever.  Respiratory:  Negative for cough.   Cardiovascular:  Negative for chest pain and palpitations.  Gastrointestinal:  Negative for nausea and vomiting.  Musculoskeletal:  Positive for back pain.  Neurological:  Positive for numbness.      Objective:    BP 138/80   Pulse 72   Temp 98.3 F (36.8 C) (Oral)   Ht 5\' 1"  (1.549 m)   Wt 170 lb (77.1 kg)   SpO2 98%   BMI 32.12 kg/m  BP Readings  from Last 3 Encounters:  04/13/23 138/80  03/25/23 (!) 144/60  02/14/23 136/82   Wt Readings from Last 3 Encounters:  04/13/23 170 lb (77.1 kg)  03/25/23 168 lb 12.8 oz (76.6 kg)  02/14/23 167 lb 3.2 oz (75.8 kg)    Physical Exam Vitals reviewed.  Constitutional:      Appearance: She is well-developed.  Eyes:     Conjunctiva/sclera: Conjunctivae normal.  Cardiovascular:     Rate and Rhythm: Normal rate and regular rhythm.     Pulses: Normal pulses.     Heart sounds: Normal heart sounds.  Pulmonary:     Effort: Pulmonary effort is normal.     Breath sounds: Normal breath sounds. No wheezing, rhonchi or rales.  Musculoskeletal:     Lumbar back: Spasms present. No tenderness or bony tenderness.       Back:     Comments: Pain over bilateral SI joint with deep palpation  Skin:    General: Skin is warm and dry.  Neurological:     Mental Status: She is alert.  Psychiatric:        Speech: Speech normal.        Behavior: Behavior normal.        Thought Content: Thought content normal.

## 2023-04-13 NOTE — Assessment & Plan Note (Signed)
Chronic, uncontrolled.  Creatinine clearance 32 mL/min.  I opted not to start tizanidine in the setting of CKD.  We discussed trial of Cymbalta.  I also encouraged her to trial  Tylenol 500 mg in the morning and then again at night. Of note:  I am not sure why she experienced palpitations on Tylenol, this is resolved and not sure if more of a coincidence and side effects.  Encouraged her to stay in front of the pain and to schedule Tylenol extra strength.  She declines second opinion with orthopedics to consider joint injection . close follow-up

## 2023-04-13 NOTE — Patient Instructions (Addendum)
May trial lower dose tylenol, extra strength, 500mg  every morning for chronic pain.    And if no improvement of pain, increase to taking tylenol 500mg   in the morning and at night.  Please trial over-the-counter counter Salonpas pain patch.  I have also prescribed a pain medication Cymbalta to trial for chronic pain.  This medication increases serotonin.  Please reconsider seeing orthopedics for a second opinion

## 2023-04-14 LAB — TSH: TSH: 4.68 u[IU]/mL (ref 0.35–5.50)

## 2023-04-14 LAB — B12 AND FOLATE PANEL
Folate: 14.2 ng/mL (ref >5.9)
Vitamin B-12: 405 pg/mL (ref 211–911)

## 2023-04-21 NOTE — Progress Notes (Signed)
Office Note    HPI: Sandria A Coykendall is a 82 y.o. ([birth year]) female presenting in follow up s/p right groin exploration, redo CFA patch plasty with ipsilateral GSV on 03/30/22.  Prior surgeries were performed at [facility] and include: Right renal artery stenting 4mm left renal artery stenting (6mm) (2015) Left subclavian artery stenting 9mm (2023) Bilateral common iliac artery stenting Bilateral external iliac stenting Left common femoral, profunda femoris, and superficial femoral artery endarterectomies Right common femoral, profunda femoris, and superficial femoral artery endarterectomies Stent placement to left external iliac artery with 8 mm diameter by 4 cm length of life star stent ___  09/2022 balloon angioplasty of the right EIA, bilateral renal angioplasty   On exam today, Janilah was doing okay.she continues to have bilateral lower extremity swelling, and chronic tightness.  She denied rest pain or ischemic tissue loss, but noted some claudication.  Denies food fear, weight loss.  Denies stroke, TIA, amaurosis symptoms. Denies abdominal pain, back pain.   The pt is  on a daily aspirin.   Other AC:  plavix The pt is  on medication for hypertension.   The pt is not diabetic.  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 b.) TTE 11/16/2021: EF >55%; mild AS (MPG 9 mmHg)   CAD (coronary artery disease)    a.) s/p 3v CABG 03/29/2000   Cardiac arrest [facility])    Carotid artery stenosis    a.) s/p LEFT CEA 09/09/2003. b.) Carotid doppler 16/07/9603: 1-39% LICA,  CTO RICA; subclavian stenosis   Cataracts, bilateral    Cervical spondylosis without myelopathy    Chronic diastolic CHF (congestive heart failure), NYHA class 3 (HCC)    a.) TTE 05/27/2016: EF >55%, mild LA enlargement, triv PR, mild MR, mod TR; G3DD. b.) TTE 12/12/2017: EF >55%, mild LVH, BAE, mild MR/PR, mod TR; RVSP 52.8 mmHg. c.) TTE 03/18/2020: EF >55%, BAD, AS (MPG 8.7 mmHg); triv MR, mild TR/PR. d.) TTE 11/16/2021: EF >55%, LVH, G1DD, triv MR, mild PR, mod TR; AS (MPG 9 mmHg); MS (MPG 5 mmHg)   Chronic kidney disease, stage III (moderate) (HCC)    Chronic narcotic use 06/24/2014   Chronic pain syndrome    GERD (gastroesophageal reflux disease)    History of 2019 novel coronavirus disease (COVID-19) 09/22/2021   Hyperlipidemia    Hypertension    Long term current use of antithrombotics/antiplatelets    a.) on daily DAPT therapy (ASA + clopidogrel)   Lumbar stenosis with neurogenic claudication    Mitral stenosis 11/16/2021   a.) TTE 11/16/2021: EF >55%; mod MS (MPG 5 mmHg)   Osteoarthritis of hip    Postoperative wound infection 03/29/2022   Pulmonary hypertension (HCC) 12/12/2017   a.) TTE 12/12/2017: mild; RVSP 52.8 mmHg   PVD (peripheral vascular disease) (HCC)    Renal artery stenosis (HCC)    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    Past Surgical History:  Procedure Laterality Date   ABDOMINAL AORTOGRAM W/LOWER EXTREMITY N/A 10/06/2022   Procedure: ABDOMINAL AORTOGRAM W/LOWER EXTREMITY;  Surgeon: Victorino Sparrow, MD;  Location: [facility] INVASIVE CV LAB;  Service: Cardiovascular;  Laterality: N/A;   ABDOMINAL HYSTERECTOMY  1976   CAROTID ARTERY ANGIOPLASTY Left    CAROTID ENDARTERECTOMY Left 09/09/2003   Procedure: CAROTID ENDARTERECTOMY; Location: [facility];  Surgeon: Magda Bernheim, MD   COLONOSCOPY WITH PROPOFOL N/A 08/16/2017   Procedure: COLONOSCOPY WITH PROPOFOL;  Surgeon: Midge Minium, MD;  Location: [facility] ENDOSCOPY;  Service: Endoscopy;  Laterality: N/A;   CORONARY ANGIOPLASTY WITH STENT PLACEMENT  2000   CORONARY ARTERY BYPASS GRAFT N/A 03/29/2000   Procedure: 3v CORONARY ARTERY BYPASS GRAFT; Location: [facility]   CYSTOSCOPY WITH STENT PLACEMENT Bilateral    ENDARTERECTOMY FEMORAL Bilateral 03/17/2022   Procedure: ENDARTERECTOMY FEMORAL ( BILATERAL SFA STENT);  Surgeon: Renford Dills, MD;  Location: [facility] ORS;  Service: Vascular;  Laterality: Bilateral;   ENDARTERECTOMY FEMORAL Right 03/30/2022   Procedure: RE-EXPOSURE OF RIGHT COMMON FEMORAL ARTERY, RE-DO RIGHT FEMORAL ENDARTERECTOMY WITH VEIN PATCH;  Surgeon: Victorino Sparrow, MD;  Location: [facility] OR;  Service: Vascular;  Laterality: Right;  WOUND VAC   ESOPHAGOGASTRODUODENOSCOPY (EGD) WITH PROPOFOL N/A 08/16/2017   Procedure: ESOPHAGOGASTRODUODENOSCOPY (EGD) WITH PROPOFOL;  Surgeon: Midge Minium, MD;  Location: [facility] ENDOSCOPY;  Service: Endoscopy;  Laterality: N/A;   ESOPHAGOGASTRODUODENOSCOPY (EGD) WITH PROPOFOL N/A 06/29/2018   Procedure: ESOPHAGOGASTRODUODENOSCOPY (EGD) WITH PROPOFOL;  Surgeon: Pasty Spillers, MD;  Location: [facility] ENDOSCOPY;  Service: Endoscopy;  Laterality: N/A;   GROIN DEBRIDEMENT Right 03/30/2022   Procedure: RIGHT GROIN IRRIGATION & DEBRIDEMENT;  Surgeon: Victorino Sparrow, MD;  Location: [facility] OR;  Service: Vascular;  Laterality: Right;   INSERTION OF ILIAC STENT Left 03/17/2022   Procedure: INSERTION OF ILIAC STENT;  Surgeon: Renford Dills, MD;  Location: [facility] ORS;  Service: Vascular;  Laterality: Left;   LAPAROSCOPIC CHOLECYSTECTOMY Left 10/26/1999   Procedure: LAPAROSCOPIC CHOLECYSTECTOMY; Location: [facility]; Surgeon: Renda Rolls, MD   LEFT HEART CATH AND CORONARY ANGIOGRAPHY N/A 11/18/2021   Procedure: LEFT HEART CATH AND CORONARY ANGIOGRAPHY;  Surgeon: Alwyn Pea, MD;  Location: [facility] INVASIVE CV LAB;  Service: Cardiovascular;  Laterality: N/A;   LEFT HEART CATH AND CORS/GRAFTS ANGIOGRAPHY Left 11/19/2002   Procedure: LEFT HEART CATH AND CORS/GRAFTS ANGIOGRAPHY; Location: [facility]; Surgeon: Rudean Hitt, MD   LEFT HEART CATH AND CORS/GRAFTS ANGIOGRAPHY Left 09/17/2003   Procedure: LEFT HEART CATH AND CORS/GRAFTS ANGIOGRAPHY; Location: [facility]; Surgeon: Rudean Hitt, MD   LOWER EXTREMITY ANGIOGRAPHY Right 01/06/2022   Procedure: Lower Extremity Angiography;  Surgeon: Renford Dills, MD;  Location: [facility] INVASIVE CV LAB;  Service: Cardiovascular;  Laterality: Right;   PERIPHERAL VASCULAR BALLOON ANGIOPLASTY  10/06/2022   Procedure: PERIPHERAL VASCULAR BALLOON ANGIOPLASTY;  Surgeon: Victorino Sparrow, MD;  Location: [facility] INVASIVE CV LAB;  Service: Cardiovascular;;  left iliac and bilateral renal   RENAL ARTERY ANGIOPLASTY Bilateral 12/2013   TOE AMPUTATION Right    small toe   TONSILLECTOMY AND ADENOIDECTOMY     TOTAL HIP ARTHROPLASTY Left 2005   TOTAL HIP ARTHROPLASTY Right 2015   UPPER EXTREMITY ANGIOGRAPHY Left 11/23/2021   Procedure: UPPER EXTREMITY ANGIOGRAPHY;  Surgeon: Annice Needy, MD;  Location: [facility] INVASIVE CV LAB;  Service: Cardiovascular;  Laterality: Left;    Social History   Socioeconomic History   Marital status: Divorced  Spouse name: Not on file   Number of children: 3   Years of education: Not on file   Highest education level: Not on file  Occupational History   Occupation: retired  Tobacco Use   Smoking status: Former    Types: Cigarettes    Quit date: 10/25/1998    Years since quitting: 24.5   Smokeless tobacco: Never  Vaping Use   Vaping Use: Never used  Substance and Sexual Activity   Alcohol use: No    Alcohol/week: 0.0 standard drinks of alcohol   Drug use: Never   Sexual activity: Not Currently  Other Topics Concern   Not on file  Social History Narrative   Daughter Zella Ball ([place]); 1 in [employer]; 1  in [place]   Lives alone   Social Determinants of Health   Financial Resource Strain: Low Risk  (09/22/2017)   Overall Financial Resource Strain (CARDIA)    Difficulty of Paying Living Expenses: Not hard at all  Food Insecurity: No Food Insecurity (08/23/2022)   Hunger Vital Sign    Worried About Running Out of Food in the Last Year: Never true    Ran Out of Food in the Last Year: Never true  Transportation Needs: No Transportation Needs (08/23/2022)   PRAPARE - Administrator, Civil Service (Medical): No    Lack of Transportation (Non-Medical): No  Physical Activity: Inactive (09/22/2017)   Exercise Vital Sign    Days of Exercise per Week: 0 days    Minutes of Exercise per Session: 0 min  Stress: Unknown (09/22/2017)   [employer] of Occupational Health - Occupational Stress Questionnaire    Feeling of Stress : Patient declined  Social Connections: Unknown (09/22/2017)   Social Connection and Isolation Panel [NHANES]    Frequency of Communication with Friends and Family: Patient declined    Frequency of Social Gatherings with Friends and Family: Patient declined    Attends Religious Services: Patient declined    Database administrator or Organizations: Patient declined    Attends Banker Meetings: Patient declined    Marital Status: Patient declined  Intimate Partner Violence: Not At Risk (09/22/2017)   Humiliation, Afraid, Rape, and Kick questionnaire    Fear of Current or Ex-Partner: No    Emotionally Abused: No    Physically Abused: No    Sexually Abused: No    Family History  Problem Relation Age of Onset   Stroke Mother    Hypertension Mother    Diabetes Mother    Hypertension Father    Heart disease Sister        MI   Multiple sclerosis Daughter    Multiple sclerosis Son    Cerebral aneurysm Son    Seizures Son    Cerebral aneurysm Son    Breast cancer Paternal Aunt 56    Current Outpatient Medications  Medication Sig  Dispense Refill   amLODipine (NORVASC) 10 MG tablet Take 1 tablet (10 mg total) by mouth at bedtime. 90 tablet 1   aspirin 81 MG tablet Take 81 mg by mouth daily.     clopidogrel (PLAVIX) 75 MG tablet Take 1 tablet (75 mg total) by mouth at bedtime. To prevent strokes 90 tablet 1   DULoxetine (CYMBALTA) 30 MG capsule Take 1 capsule (30 mg total) by mouth daily. 90 capsule 1   ezetimibe (ZETIA) 10 MG tablet Take 1 tablet (10 mg total) by mouth at bedtime. 90 tablet 3   HYDROcodone-acetaminophen (NORCO) 10-325  MG tablet Take 1 tablet by mouth every 6 (six) hours as needed for moderate pain. 90 tablet 0   labetalol (NORMODYNE) 300 MG tablet Take 1 tablet (300 mg total) by mouth 2 (two) times daily. For hypertension 180 tablet 1   losartan (COZAAR) 50 MG tablet Take 1 tablet (50 mg total) by mouth at bedtime. 90 tablet 1   nortriptyline (PAMELOR) 10 MG capsule Take 1 capsule (10 mg total) by mouth 2 (two) times daily. For chronic headaches 180 capsule 3   pantoprazole (PROTONIX) 40 MG tablet One tablet upon waking,  take 30 minuts prior to eating 90 tablet 3   ranolazine (RANEXA) 500 MG 12 hr tablet Take 1 tablet (500 mg total) by mouth 2 (two) times daily. 60 tablet 3   triamcinolone cream (KENALOG) 0.1 % Apply 1 Application topically 2 (two) times daily. To all areas of rash 453.6 g 0   No current facility-administered medications for this visit.    Allergies  Allergen Reactions   Citalopram Anaphylaxis    Throat closing    Dilaudid [Hydromorphone Hcl] Nausea And Vomiting   Hydrochlorothiazide Other (See Comments)    Decreased GFR (Nov 2015)   Liothyronine     Hair fell out, caused headaches    Nsaids     CKD stage III - avoid nephrotoxic drugs   Nubain [Nalbuphine Hcl]     Burning sensation in back   Penicillins Itching   Prasugrel Itching   Statins Itching     REVIEW OF SYSTEMS:   [X]  denotes positive finding, [ ]  denotes negative finding Cardiac  Comments:  Chest pain or  chest pressure:    Shortness of breath upon exertion:    Short of breath when lying flat:    Irregular heart rhythm:        Vascular    Pain in calf, thigh, or hip brought on by ambulation:    Pain in feet at night that wakes you up from your sleep:     Blood clot in your veins:    Leg swelling:         Pulmonary    Oxygen at home:    Productive cough:     Wheezing:         Neurologic    Sudden weakness in arms or legs:     Sudden numbness in arms or legs:     Sudden onset of difficulty speaking or slurred speech:    Temporary loss of vision in one eye:     Problems with dizziness:         Gastrointestinal    Blood in stool:     Vomited blood:         Genitourinary    Burning when urinating:     Blood in urine:        Psychiatric    Major depression:         Hematologic    Bleeding problems:    Problems with blood clotting too easily:        Skin    Rashes or ulcers:        Constitutional    Fever or chills:      PHYSICAL EXAMINATION:  There were no vitals filed for this visit.   General:  WDWN in NAD; vital signs documented above Gait: Not observed HENT: WNL, normocephalic Pulmonary: normal non-labored breathing , without wheezing Cardiac: regular HR, Abdomen: soft, NT, no masses Skin: without rashes Vascular Exam/Pulses:  Right  Left  Radial 2+ (normal) 2+ (normal)  Ulnar 2+ (normal) 2+ (normal)  Femoral    Popliteal    DP nonpalpable nonpalpable  PT nonpalpable nonpalpable   Extremities: without ischemic changes, without Gangrene , without cellulitis; without open wounds;  Right groin wound with 3 small areas opened, probe less than 1 cm, Musculoskeletal: no muscle wasting or atrophy  Neurologic: A&O X 3;  No focal weakness or paresthesias are detected Psychiatric:  The pt has Normal affect.   Non-Invasive Vascular Imaging:    Summary:  Right Carotid: Evidence consistent with a total occlusion of the right  ICA.   Left Carotid:  Velocities in the left ICA are consistent with a 1-39%  stenosis.   +-------+-----------+-----------+------------+------------+  ABI/TBIToday's ABIToday's TBIPrevious ABIPrevious TBI  +-------+-----------+-----------+------------+------------+  Right 0.53       0.38       0.54        0.39          +-------+-----------+-----------+------------+------------+  Left  0.60       0.40       0.59        0.43          +-------+-----------+-----------+------------+------------+   Abdominal Aorta Findings:  Abdominal Aorta Findings:  +-------------+-------+----------+----------+----------+--------+--------+  Location    AP (cm)Trans (cm)PSV (cm/s)Waveform  ThrombusComments  +-------------+-------+----------+----------+----------+--------+--------+  Distal                       141                                   +-------------+-------+----------+----------+----------+--------+--------+  RT CIA Prox                   181       monophasic                  +-------------+-------+----------+----------+----------+--------+--------+  RT CIA Mid                    225       monophasic                  +-------------+-------+----------+----------+----------+--------+--------+  RT CIA Distal                 339       monophasic                  +-------------+-------+----------+----------+----------+--------+--------+  RT EIA Prox                   210       monophasic                  +-------------+-------+----------+----------+----------+--------+--------+    +-------------+-------+----------+----------+----------+--------+--------+  RT EIA Distal                 104       monophasic                  +-------------+-------+----------+----------+----------+--------+--------+  LT CIA Prox                   215       monophasic                  +-------------+-------+----------+----------+----------+--------+--------+   LT CIA Mid  227       monophasic                  +-------------+-------+----------+----------+----------+--------+--------+  LT CIA Distal                 240       monophasic                  +-------------+-------+----------+----------+----------+--------+--------+  LT EIA Distal                 54        monophasic                  +-------------+-------+----------+----------+----------+--------+--------+     Left Stent(s):  +---------------+--------+---------------+----------+--------+  EIA dst stent  PSV cm/sStenosis       Waveform  Comments  +---------------+--------+---------------+----------+--------+  Prox to Stent  219                    monophasic          +---------------+--------+---------------+----------+--------+  Proximal Stent 371     50-99% stenosismonophasic          +---------------+--------+---------------+----------+--------+  Mid Stent      310     50-99% stenosismonophasic          +---------------+--------+---------------+----------+--------+  Distal Stent   306     50-99% stenosismonophasic          +---------------+--------+---------------+----------+--------+  Distal to Stent284     50-99% stenosismonophasic          +---------------+--------+---------------+----------+--------+     ASSESSMENT/PLAN: Zenita A Rezai is a 82 y.o. female presenting in follow up s/p right groin exploration, redo CFA patch plasty with ipsilateral GSV.  In December 2023 she underwent right-sided EIA drug-coated balloon angioplasty, bilateral renal artery angioplasty for assisted primary patency of bilateral renal artery stents.  Nancey has undergone multiple lower extremity operations-see above.  At her last visit, she had elevated velocities in the right common iliac artery at the level of the previously placed stents.  I decided to treat conservatively with plans to repeat angiography should  velocities increase.  On today's duplex ultrasound, velocities and increased in the left-sided and right-sided stents.  By definition, the stents are threatened, and I am concerned that the current level of stenosis could result in occlusion.  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  After discussing risk and benefits of bilateral lower extremity angiography with emphasis on iliac artery stenting bilaterally, devils elected to proceed.  CKD unchanged, no concern for TIA, stroke, amaurosis -plan for renal stent and carotid duplex at her next visit.   Victorino Sparrow, MD [facility] [phone]  Total time of patient care including pre-visit research, consultation, and documentation greater than 40 minutes

## 2023-04-22 ENCOUNTER — Encounter (HOSPITAL_COMMUNITY): Payer: Medicare Other

## 2023-04-22 ENCOUNTER — Ambulatory Visit (INDEPENDENT_AMBULATORY_CARE_PROVIDER_SITE_OTHER): Payer: Medicare Other | Admitting: Vascular Surgery

## 2023-04-22 ENCOUNTER — Ambulatory Visit (INDEPENDENT_AMBULATORY_CARE_PROVIDER_SITE_OTHER)
Admission: RE | Admit: 2023-04-22 | Discharge: 2023-04-22 | Disposition: A | Discharge status: Home / Self Care | Payer: Medicare Other | Source: Ambulatory Visit | Attending: Vascular Surgery | Admitting: Vascular Surgery

## 2023-04-22 ENCOUNTER — Ambulatory Visit (HOSPITAL_COMMUNITY)
Admission: RE | Admit: 2023-04-22 | Discharge: 2023-04-22 | Disposition: A | Discharge status: Home / Self Care | Payer: Medicare Other | Source: Ambulatory Visit | Attending: Vascular Surgery | Admitting: Vascular Surgery

## 2023-04-22 ENCOUNTER — Encounter: Payer: Self-pay | Admitting: Vascular Surgery

## 2023-04-22 VITALS — BP 172/76 | HR 76 | Temp 98.2°F | Resp 20 | Ht 61.0 in | Wt 170.0 lb

## 2023-04-22 DIAGNOSIS — I739 Peripheral vascular disease, unspecified: Secondary | ICD-10-CM

## 2023-04-22 DIAGNOSIS — Z95828 Presence of other vascular implants and grafts: Secondary | ICD-10-CM | POA: Diagnosis not present

## 2023-04-22 DIAGNOSIS — I6521 Occlusion and stenosis of right carotid artery: Secondary | ICD-10-CM | POA: Diagnosis not present

## 2023-04-22 DIAGNOSIS — T82856A Stenosis of peripheral vascular stent, initial encounter: Secondary | ICD-10-CM

## 2023-04-22 DIAGNOSIS — I7143 Infrarenal abdominal aortic aneurysm, without rupture: Secondary | ICD-10-CM

## 2023-04-22 DIAGNOSIS — I701 Atherosclerosis of renal artery: Secondary | ICD-10-CM

## 2023-04-22 DIAGNOSIS — I6523 Occlusion and stenosis of bilateral carotid arteries: Secondary | ICD-10-CM | POA: Diagnosis not present

## 2023-04-22 LAB — VAS US ABI WITH/WO TBI
Left ABI: 0.6
Right ABI: 0.53

## 2023-04-25 ENCOUNTER — Other Ambulatory Visit: Payer: Self-pay

## 2023-04-25 ENCOUNTER — Telehealth: Payer: Self-pay

## 2023-04-25 DIAGNOSIS — G8929 Other chronic pain: Secondary | ICD-10-CM | POA: Diagnosis not present

## 2023-04-25 DIAGNOSIS — R202 Paresthesia of skin: Secondary | ICD-10-CM | POA: Diagnosis not present

## 2023-04-25 DIAGNOSIS — M5441 Lumbago with sciatica, right side: Secondary | ICD-10-CM | POA: Diagnosis not present

## 2023-04-25 DIAGNOSIS — M5442 Lumbago with sciatica, left side: Secondary | ICD-10-CM | POA: Diagnosis not present

## 2023-04-25 DIAGNOSIS — M79604 Pain in right leg: Secondary | ICD-10-CM | POA: Diagnosis not present

## 2023-04-25 DIAGNOSIS — R200 Anesthesia of skin: Secondary | ICD-10-CM | POA: Diagnosis not present

## 2023-04-25 DIAGNOSIS — T82856A Stenosis of peripheral vascular stent, initial encounter: Secondary | ICD-10-CM

## 2023-04-25 DIAGNOSIS — M79605 Pain in left leg: Secondary | ICD-10-CM | POA: Diagnosis not present

## 2023-04-25 NOTE — Telephone Encounter (Signed)
Attempted to reach patient to schedule aortogram, but no answer. Left VM for patient to return call.  

## 2023-04-25 NOTE — Telephone Encounter (Signed)
Patient returned call and scheduled for procedure on 7/10. Instructions provided and she voiced understanding.

## 2023-04-26 ENCOUNTER — Telehealth: Payer: Self-pay

## 2023-04-26 ENCOUNTER — Encounter: Payer: Self-pay | Admitting: Internal Medicine

## 2023-04-26 ENCOUNTER — Ambulatory Visit (INDEPENDENT_AMBULATORY_CARE_PROVIDER_SITE_OTHER): Payer: Medicare Other | Admitting: Internal Medicine

## 2023-04-26 VITALS — BP 132/60 | HR 72 | Temp 97.6°F | Ht 61.0 in | Wt 172.6 lb

## 2023-04-26 DIAGNOSIS — N183 Chronic kidney disease, stage 3 unspecified: Secondary | ICD-10-CM

## 2023-04-26 DIAGNOSIS — E039 Hypothyroidism, unspecified: Secondary | ICD-10-CM | POA: Diagnosis not present

## 2023-04-26 DIAGNOSIS — M5416 Radiculopathy, lumbar region: Secondary | ICD-10-CM

## 2023-04-26 DIAGNOSIS — I701 Atherosclerosis of renal artery: Secondary | ICD-10-CM | POA: Diagnosis not present

## 2023-04-26 DIAGNOSIS — I70229 Atherosclerosis of native arteries of extremities with rest pain, unspecified extremity: Secondary | ICD-10-CM | POA: Diagnosis not present

## 2023-04-26 DIAGNOSIS — I714 Abdominal aortic aneurysm, without rupture, unspecified: Secondary | ICD-10-CM

## 2023-04-26 DIAGNOSIS — I10 Essential (primary) hypertension: Secondary | ICD-10-CM

## 2023-04-26 DIAGNOSIS — M48062 Spinal stenosis, lumbar region with neurogenic claudication: Secondary | ICD-10-CM

## 2023-04-26 DIAGNOSIS — E782 Mixed hyperlipidemia: Secondary | ICD-10-CM

## 2023-04-26 DIAGNOSIS — K219 Gastro-esophageal reflux disease without esophagitis: Secondary | ICD-10-CM

## 2023-04-26 DIAGNOSIS — I739 Peripheral vascular disease, unspecified: Secondary | ICD-10-CM

## 2023-04-26 DIAGNOSIS — D631 Anemia in chronic kidney disease: Secondary | ICD-10-CM | POA: Diagnosis not present

## 2023-04-26 DIAGNOSIS — F419 Anxiety disorder, unspecified: Secondary | ICD-10-CM | POA: Diagnosis not present

## 2023-04-26 DIAGNOSIS — G8929 Other chronic pain: Secondary | ICD-10-CM | POA: Diagnosis not present

## 2023-04-26 DIAGNOSIS — F32A Depression, unspecified: Secondary | ICD-10-CM

## 2023-04-26 DIAGNOSIS — I130 Hypertensive heart and chronic kidney disease with heart failure and stage 1 through stage 4 chronic kidney disease, or unspecified chronic kidney disease: Secondary | ICD-10-CM

## 2023-04-26 DIAGNOSIS — Z1231 Encounter for screening mammogram for malignant neoplasm of breast: Secondary | ICD-10-CM

## 2023-04-26 LAB — COMPREHENSIVE METABOLIC PANEL
ALT: 10 U/L (ref 0–35)
AST: 14 U/L (ref 0–37)
Albumin: 3.8 g/dL (ref 3.5–5.2)
Alkaline Phosphatase: 69 U/L (ref 39–117)
BUN: 27 mg/dL — ABNORMAL HIGH (ref 6–23)
CO2: 24 mEq/L (ref 19–32)
Calcium: 9.7 mg/dL (ref 8.4–10.5)
Chloride: 103 mEq/L (ref 96–112)
Creatinine, Ser: 1.7 mg/dL — ABNORMAL HIGH (ref 0.40–1.20)
GFR: 28.01 mL/min — ABNORMAL LOW (ref >60.00)
Glucose, Bld: 92 mg/dL (ref 70–99)
Potassium: 4.2 mEq/L (ref 3.5–5.1)
Sodium: 135 mEq/L (ref 135–145)
Total Bilirubin: 0.6 mg/dL (ref 0.2–1.2)
Total Protein: 7 g/dL (ref 6.0–8.3)

## 2023-04-26 LAB — LIPID PANEL
Cholesterol: 187 mg/dL (ref 0–200)
HDL: 61 mg/dL (ref >39.00)
LDL Cholesterol: 116 mg/dL — ABNORMAL HIGH (ref 0–99)
NonHDL: 126.49
Total CHOL/HDL Ratio: 3
Triglycerides: 51 mg/dL (ref 0.0–149.0)
VLDL: 10.2 mg/dL (ref 0.0–40.0)

## 2023-04-26 LAB — LDL CHOLESTEROL, DIRECT: Direct LDL: 107 mg/dL

## 2023-04-26 MED ORDER — HYDROCODONE-ACETAMINOPHEN 10-325 MG PO TABS: 1.0000 | ORAL_TABLET | Freq: Every day | ORAL | 0 refills | Status: DC | PRN

## 2023-04-26 MED ORDER — AMLODIPINE BESYLATE 10 MG PO TABS: 10.0000 mg | ORAL_TABLET | Freq: Every day | ORAL | 1 refills | Status: DC

## 2023-04-26 MED ORDER — PANTOPRAZOLE SODIUM 40 MG PO TBEC: DELAYED_RELEASE_TABLET | ORAL | 3 refills | Status: DC

## 2023-04-26 MED ORDER — LABETALOL HCL 300 MG PO TABS: 300.0000 mg | ORAL_TABLET | Freq: Two times a day (BID) | ORAL | 1 refills | Status: DC

## 2023-04-26 NOTE — Assessment & Plan Note (Signed)
With RAS found and managed with bilateral stents , and zetia (until recently)

## 2023-04-26 NOTE — Telephone Encounter (Signed)
noted 

## 2023-04-26 NOTE — Assessment & Plan Note (Addendum)
Untreaed medication due to statin intolerance and zetia discontiuation. She states that she is No longer taking zetia due to insurance  nonpayment.  Will check with pharmacy and engage [facility] CCM for assistance if this is true.  Lipids ordered.

## 2023-04-26 NOTE — Telephone Encounter (Signed)
Lurena Joiner is calling from [facility] to state they received a prescription for patient for HYDROcodone-acetaminophen (NORCO) 10-325 MG tablet.  Lurena Joiner states they need an ICD code.

## 2023-04-26 NOTE — Telephone Encounter (Signed)
Rx has to be resent with ICD 10 code

## 2023-04-26 NOTE — Assessment & Plan Note (Signed)
Thyroid function is WNL on current dose.  No current changes needed.    Lab Results  Component Value Date   TSH 4.68 04/13/2023

## 2023-04-26 NOTE — Progress Notes (Unsigned)
Subjective:  Patient ID: Sabrina [name], female    DOB: 1941/04/08  Age: 82 y.o. MRN: [id number]  CC: The primary encounter diagnosis was Encounter for screening mammogram for malignant neoplasm of breast. 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   HPI Sabrina [name] presents for  Chief Complaint  Patient presents with   Medical Management of Chronic Issues   She is frustrated and "tired of hurting"   "LOWER BACK IS KILLING ME"  HIPS AND ANTERIOR OF BOTH THIGHS.  Marland Kitchen  USING TYLENOL ARTHRITIS 2 TIMES DAILY .  SHE SAYS THE CYMBALTA SPEEDS HER HEART RATE UP BUT SHE IS TOLERATING  IT WITHOUT SOB.  LAST DOSE WAS SUNDAY NIGHT   HIPS AND LOW BACK ONLY HURT WHEN WALKING . HIP PAIN IS RELIEVED IMMEDIATELY BY SITTING DOWN,  BUT AFTER 10-15 STEPS THE PAIN BEGINS   ANGIOPLASTY OF BOTH LOWER EXTREMITIES SCHEDULED FOR NEXT WEEK IN [place] due to increased velocity on recent arterial studies.   Ankle edema bothersome but she can't put stockings on.  Getting ready to move in with her daughter in [place]  who can help her daily.  Wants to avoid nursing home .     Outpatient Medications Prior to Visit  Medication Sig Dispense Refill   aspirin 81 MG tablet Take 81 mg by mouth daily.     clopidogrel (PLAVIX) 75 MG tablet Take 1 tablet (75 mg total) by mouth at bedtime. To prevent strokes 90 tablet 1   DULoxetine (CYMBALTA) 30 MG capsule Take 1 capsule (30 mg total) by mouth daily. 90 capsule 1   ezetimibe (ZETIA) 10 MG tablet Take 1 tablet (10 mg total) by mouth at bedtime. 90 tablet 3   losartan (COZAAR) 50 MG tablet Take 1 tablet (50  mg total) by mouth at bedtime. 90 tablet 1   nortriptyline (PAMELOR) 10 MG capsule Take 1 capsule (10 mg total) by mouth 2 (two) times daily. For chronic headaches 180 capsule 3   ranolazine (RANEXA) 500 MG 12 hr tablet Take 1 tablet (500 mg total) by mouth 2 (two) times daily. 60 tablet 3   triamcinolone cream (KENALOG) 0.1 % Apply 1 Application topically 2 (two) times daily. To all areas of rash 453.6 g 0   amLODipine (NORVASC) 10 MG tablet Take 1 tablet (10 mg total) by mouth at bedtime. 90 tablet 1   HYDROcodone-acetaminophen (NORCO) 10-325 MG tablet Take 1 tablet by mouth every 6 (six) hours as needed for moderate pain. 90 tablet 0   labetalol (NORMODYNE) 300 MG tablet Take 1 tablet (300 mg total) by mouth 2 (two) times daily. For hypertension 180 tablet 1   pantoprazole (PROTONIX) 40 MG tablet One tablet upon waking,  take 30 minuts prior to eating 90 tablet 3   No facility-administered medications prior to visit.    Review of Systems;  Patient denies headache, fevers, malaise, unintentional weight loss, skin rash, eye pain, sinus congestion and sinus pain, sore throat, dysphagia,  hemoptysis , cough, dyspnea, wheezing, chest pain, palpitations, orthopnea, edema, abdominal pain, nausea, melena, diarrhea, constipation, flank pain, dysuria, hematuria, urinary  Frequency, nocturia, numbness, tingling, seizures,  Focal weakness, Loss of consciousness,  Tremor, insomnia, depression, anxiety, and suicidal ideation.  Objective:  BP 132/60   Pulse 72   Temp 97.6 F (36.4 C) (Oral)   Ht 5\' 1"  (1.549 m)   Wt 172 lb 9.6 oz (78.3 kg)   SpO2 95%   BMI 32.61 kg/m   BP Readings from Last 3 Encounters:  04/26/23 132/60  04/22/23 (!) 172/76  04/13/23 138/80    Wt Readings from Last 3 Encounters:  04/26/23 172 lb 9.6 oz (78.3 kg)  04/22/23 170 lb (77.1 kg)  04/13/23 170 lb (77.1 kg)    Physical Exam Vitals reviewed.  Constitutional:      General: She is not in acute distress.     Appearance: Normal appearance. She is normal weight. She is not ill-appearing, toxic-appearing or diaphoretic.  HENT:     Head: Normocephalic.  Eyes:     General: No scleral icterus.       Right eye: No discharge.        Left eye: No discharge.     Conjunctiva/sclera: Conjunctivae normal.  Cardiovascular:     Rate and Rhythm: Normal rate and regular rhythm.     Heart sounds: Normal heart sounds.  Pulmonary:     Effort: Pulmonary effort is normal. No respiratory distress.     Breath sounds: Normal breath sounds.  Musculoskeletal:        General: Normal range of motion.  Skin:    General: Skin is warm and dry.  Neurological:     General: No focal deficit present.     Mental Status: She is alert and oriented to person, place, and time. Mental status is at baseline.  Psychiatric:        Mood and Affect: Mood normal.        Behavior: Behavior normal.        Thought Content: Thought content normal.        Judgment: Judgment normal.    Lab Results  Component Value Date   HGBA1C 5.9 07/19/2016   HGBA1C 5.8 06/02/2015    Lab Results  Component Value Date   CREATININE 1.67 (H) 02/17/2023   CREATININE 1.90 (H) 10/06/2022   CREATININE 1.66 (H) 09/23/2022    Lab Results  Component Value Date   WBC 9.5 02/17/2023   HGB 12.1 02/17/2023   HCT 35.5 (L) 02/17/2023   PLT 321.0 02/17/2023   GLUCOSE 122 (H) 02/17/2023   CHOL 206 (H) 01/28/2022   TRIG 61 01/28/2022   HDL 68 01/28/2022   LDLDIRECT 109.0 03/05/2016   LDLCALC 123 (H) 01/28/2022   ALT 16 04/06/2022   AST 23 04/06/2022   NA 140 02/17/2023   K 3.7 02/17/2023   CL 105 02/17/2023   CREATININE 1.67 (H) 02/17/2023   BUN 27 (H) 02/17/2023   CO2 23 02/17/2023   TSH 4.68 04/13/2023   INR 1.3 (H) 03/31/2022   HGBA1C 5.9 07/19/2016    VAS [place] ABI WITH/WO TBI  Result Date: 04/22/2023  LOWER EXTREMITY DOPPLER STUDY Patient Name:  Sabrina [name]  Date of Exam:   04/22/2023 Medical Rec #: [id number]           Accession #:     [id number] Date of Birth: 08/13/1941           Patient Gender: F Patient Age:   60 years Exam Location:  [facility] Procedure:      VAS [place] ABI WITH/WO TBI Referring Phys: JOSHUA ROBINS --------------------------------------------------------------------------------  Indications: Claudication, and peripheral artery disease. High Risk Factors: Hypertension, hyperlipidemia, past history  of smoking,                    coronary artery disease.  Vascular Interventions: 10/06/2022 Right external iliac drug-coated balloon                         angioplasty 6 x 40 mm . Bilateral selective renal artery                         angiogram. Left renal artery balloon angioplasty 6 x 20                         mm. Right renal artery angioplasty 4 x 20 mm                          03/17/22 left CFA, PFA, SFA endarterectomies, right CFA,                         PFA, SFA endarterectomies, left EIA stent with 8mm x 4cm                         life star stent.                          Right renal artery, Left renal artery stenting (2015) at                         [facility]. Bilateral CIA and EIA stenting Performing Technologist: Elita Quick RVT  Examination Guidelines: A complete evaluation includes at minimum, Doppler waveform signals and systolic blood pressure reading at the level of bilateral brachial, anterior tibial, and posterior tibial arteries, when vessel segments are accessible. Bilateral testing is considered an integral part of a complete examination. Photoelectric Plethysmograph (PPG) waveforms and toe systolic pressure readings are included as required and additional duplex testing as needed. Limited examinations for reoccurring indications may be performed as noted.  ABI Findings: +---------+------------------+-----+----------+--------+ Right    Rt Pressure (mmHg)IndexWaveform  Comment  +---------+------------------+-----+----------+--------+ Brachial 189                                        +---------+------------------+-----+----------+--------+ PTA      99                0.52 monophasic         +---------+------------------+-----+----------+--------+ DP       100               0.53 monophasic         +---------+------------------+-----+----------+--------+ Great Toe72                0.38 Abnormal           +---------+------------------+-----+----------+--------+ +---------+------------------+-----+-------------------+-------+ Left     Lt Pressure (mmHg)IndexWaveform           Comment +---------+------------------+-----+-------------------+-------+ Brachial 180                                               +---------+------------------+-----+-------------------+-------+ PTA      91  0.48 dampened monophasic        +---------+------------------+-----+-------------------+-------+ DP       114               0.60 monophasic                 +---------+------------------+-----+-------------------+-------+ Great Toe76                0.40 Abnormal                   +---------+------------------+-----+-------------------+-------+ +-------+-----------+-----------+------------+------------+ ABI/TBIToday's ABIToday's TBIPrevious ABIPrevious TBI +-------+-----------+-----------+------------+------------+ Right  0.53       0.38       0.54        0.39         +-------+-----------+-----------+------------+------------+ Left   0.60       0.40       0.59        0.43         +-------+-----------+-----------+------------+------------+  Bilateral ABIs and TBIs appear essentially unchanged.  *See table(s) above for measurements and observations.  Electronically signed by Gerarda Fraction on 04/22/2023 at 4:43:24 PM.    Final    VAS US AORTA/IVC/ILIACS  Result Date: 04/22/2023 ABDOMINAL AORTA STUDY Patient Name:  Charmeka A [facility]  Date of Exam:   04/22/2023 Medical Rec #: [id number]           Accession #:    [id number] Date of Birth:  [birth year]           Patient Gender: F Patient Age:   64 years Exam Location:  [facility] Procedure:      VAS US AORTA/IVC/ILIACS Referring Phys: Ivin Booty ROBINS --------------------------------------------------------------------------------  Indications: Stent evaluation Risk Factors: Hypertension, hyperlipidemia, coronary artery disease. Vascular Interventions: 10/06/2022 Right external iliac drug-coated balloon                         angioplasty 6 x 40 mm . Bilateral selective renal artery                         angiogram. Left renal artery balloon angioplasty 6 x 20                         mm. Right renal artery angioplasty 4 x 20 mm                          03/17/22 left CFA, PFA, SFA endarterectomies, right CFA,                         PFA, SFA endarterectomies, left EIA stent with 8mm x 4cm                         life star stent.                          Bilateral renal artery stenting (2015) at [facility].                         Bilateral CIA and EIA artery stenting. Limitations: Air/bowel gas, obesity, patient discomfort and movement, back pain.  Performing Technologist: Elita Quick RVT  Examination Guidelines: A complete evaluation includes B-mode imaging, spectral Doppler, color Doppler, and power Doppler as needed of all accessible  portions of each vessel. Bilateral testing is considered an integral part of a complete examination. Limited examinations for reoccurring indications may be performed as noted.  Abdominal Aorta Findings: +-------------+-------+----------+----------+----------+--------+--------+ Location     AP (cm)Trans (cm)PSV (cm/s)Waveform  ThrombusComments +-------------+-------+----------+----------+----------+--------+--------+ Distal                        141                                  +-------------+-------+----------+----------+----------+--------+--------+ RT CIA Prox                   181       monophasic                  +-------------+-------+----------+----------+----------+--------+--------+ RT CIA Mid                    225       monophasic                 +-------------+-------+----------+----------+----------+--------+--------+ RT CIA Distal                 339       monophasic                 +-------------+-------+----------+----------+----------+--------+--------+ RT EIA Prox                   210       monophasic                 +-------------+-------+----------+----------+----------+--------+--------+ RT EIA Mid                    387       monophasic                 +-------------+-------+----------+----------+----------+--------+--------+ RT EIA Distal                 104       monophasic                 +-------------+-------+----------+----------+----------+--------+--------+ LT CIA Prox                   215       monophasic                 +-------------+-------+----------+----------+----------+--------+--------+ LT CIA Mid                    227       monophasic                 +-------------+-------+----------+----------+----------+--------+--------+ LT CIA Distal                 240       monophasic                 +-------------+-------+----------+----------+----------+--------+--------+ LT EIA Distal                 54        monophasic                 +-------------+-------+----------+----------+----------+--------+--------+ Left Stent(s): +---------------+--------+---------------+----------+--------+ EIA dst stent  PSV cm/sStenosis       Waveform  Comments +---------------+--------+---------------+----------+--------+ Prox to Stent  219  monophasic         +---------------+--------+---------------+----------+--------+ Proximal Stent 371     50-99% stenosismonophasic         +---------------+--------+---------------+----------+--------+ Mid Stent      310     50-99% stenosismonophasic          +---------------+--------+---------------+----------+--------+ Distal Stent   306     50-99% stenosismonophasic         +---------------+--------+---------------+----------+--------+ Distal to Stent284     50-99% stenosismonophasic         +---------------+--------+---------------+----------+--------+    Summary: Stenosis: +--------------------+-------------+---------------+ Location            Stenosis     Stent           +--------------------+-------------+---------------+ Right Common Iliac  >50% stenosis                +--------------------+-------------+---------------+ Left Common Iliac   >50% stenosis                +--------------------+-------------+---------------+ Right External Iliac>50% stenosis                +--------------------+-------------+---------------+ Left External Iliac              50-99% stenosis +--------------------+-------------+---------------+  IVC/Iliac: This was a technically difficult exam, limited visability. Unable to visualized renal artery stents. Further imaging modality may be warranted Unable to visualized the bilateral common iliac artery stents.  *See table(s) above for measurements and observations.  Electronically signed by Gerarda Fraction on 04/22/2023 at 4:42:34 PM.    Final     Assessment & Plan:  .Encounter for screening mammogram for malignant neoplasm of breast -     3D Screening Mammogram, Left and Right; Future  Essential hypertension -     amLODIPine Besylate; Take 1 tablet (10 mg total) by mouth at bedtime.  Dispense: 90 tablet; Refill: 1 -     Labetalol HCl; Take 1 tablet (300 mg total) by mouth 2 (two) times daily. For hypertension  Dispense: 180 tablet; Refill: 1 -     Comprehensive metabolic panel  Gastroesophageal reflux disease without esophagitis -     Pantoprazole Sodium; One tablet upon waking,  take 30 minuts prior to eating  Dispense: 90 tablet; Refill: 3  Hypertensive kidney and heart disease with  congestive heart failure, stage III (HCC)  Mixed hyperlipidemia -     Lipid panel -     LDL cholesterol, direct  Atherosclerosis of artery of extremity with rest pain [facility]) Assessment & Plan: Untreaed medication due to statin intolerance and zetia discontiuation. She states that she is No longer taking zetia due to insurance  nonpayment.  Will check with pharmacy and engage [facility] CCM for assistance if this is true.  Lipids ordered.    Atherosclerosis of renal artery [facility]) Assessment & Plan: With RAS found and managed with bilateral stents , and zetia (until recently)   Acquired hypothyroidism Assessment & Plan: Thyroid function is WNL on current dose.  No current changes needed.    Lab Results  Component Value Date   TSH 4.68 04/13/2023      Lumbar stenosis with neurogenic claudication Assessment & Plan: Continue cymbalta and tylenol.  EMG studies ordered by Neurology .  She is adamant about avoiding any more surgery /anesthesia.   She has not had any ER visits  And has not requested any early refills.  Her Refill history was confirmed via [place] Controlled Substance database by me today during her visit and there have been no prescriptions  of controlled substances filled from any providers other than me. .   Vicodin refilled for once daily use.    Other orders -     HYDROcodone-Acetaminophen; Take 1 tablet by mouth daily as needed for moderate pain.  Dispense: 30 tablet; Refill: 0     I provided 30 minutes of face-to-face time during this encounter reviewing patient's last visit with me, patient's  most recent visit with cardiology,  nephrology,  and neurology,  recent surgical and non surgical procedures, previous  labs and imaging studies, counseling on currently addressed issues,  and post visit ordering to diagnostics and therapeutics .   Follow-up: No follow-ups on file.   Sherlene Shams, MD

## 2023-04-26 NOTE — Patient Instructions (Addendum)
MAX OUT THE TYLENOL DOSE  AT 2000 MG DAILY  (650 MG X 3 = 2000).  OK TO ADD ONE VICODIN DAILY .  I HAVE REFILLED THE VICODIN   PLEASE RESUME CYMBALTA ,   AND CHECK YOUR HEART RATE WITH YOUR BLOOD PRESSURE MACHINE .  AS LONG AS YOUR RATE IS < 100,  YOU ARE FINE

## 2023-04-26 NOTE — Assessment & Plan Note (Signed)
Continue cymbalta and tylenol.  EMG studies ordered by Neurology .  She is adamant about avoiding any more surgery /anesthesia.   She has not had any ER visits  And has not requested any early refills.  Her Refill history was confirmed via [place] Controlled Substance database by me today during her visit and there have been no prescriptions of controlled substances filled from any providers other than me. .   Vicodin refilled for once daily use.

## 2023-04-26 NOTE — Telephone Encounter (Signed)
Has been resent with ICD 10 codes

## 2023-04-28 NOTE — Assessment & Plan Note (Signed)
S/p hematology evaluation. Per last visit there was no indication for erythropoietin therapy. She has reduced   ferrous sulfate to every other day.

## 2023-04-28 NOTE — Assessment & Plan Note (Addendum)
Monitored annually with duplex by Dr Lorretta Harp.  Last seen in June,  measuring   < 4 cm, asymptomatic .  Agree with addition of Imdur to keep BP < 130 systolic

## 2023-04-28 NOTE — Assessment & Plan Note (Signed)
Encouraged to resume Cymbalta

## 2023-04-28 NOTE — Assessment & Plan Note (Signed)
Pain is uncontrolled on tramadol 100 mg every 12 hours .  She is adamant about avoiding surgery given her last complication. Resuming vicodin and cymbalta for pain management.

## 2023-05-04 ENCOUNTER — Other Ambulatory Visit: Payer: Self-pay

## 2023-05-04 ENCOUNTER — Ambulatory Visit (HOSPITAL_COMMUNITY)
Admission: RE | Disposition: A | Discharge status: Home / Self Care | Payer: Self-pay | Source: Home / Self Care | Attending: Vascular Surgery

## 2023-05-04 ENCOUNTER — Encounter: Payer: Self-pay | Admitting: Internal Medicine

## 2023-05-04 ENCOUNTER — Ambulatory Visit (HOSPITAL_COMMUNITY)
Admission: RE | Admit: 2023-05-04 | Discharge: 2023-05-04 | Disposition: A | Discharge status: Home / Self Care | Payer: Medicare Other | Attending: Vascular Surgery | Admitting: Vascular Surgery

## 2023-05-04 DIAGNOSIS — Y832 Surgical operation with anastomosis, bypass or graft as the cause of abnormal reaction of the patient, or of later complication, without mention of misadventure at the time of the procedure: Secondary | ICD-10-CM | POA: Diagnosis not present

## 2023-05-04 DIAGNOSIS — T82898A Other specified complication of vascular prosthetic devices, implants and grafts, initial encounter: Secondary | ICD-10-CM

## 2023-05-04 DIAGNOSIS — I708 Atherosclerosis of other arteries: Secondary | ICD-10-CM | POA: Insufficient documentation

## 2023-05-04 DIAGNOSIS — Z7982 Long term (current) use of aspirin: Secondary | ICD-10-CM | POA: Diagnosis not present

## 2023-05-04 DIAGNOSIS — N183 Chronic kidney disease, stage 3 unspecified: Secondary | ICD-10-CM | POA: Diagnosis not present

## 2023-05-04 DIAGNOSIS — Z7902 Long term (current) use of antithrombotics/antiplatelets: Secondary | ICD-10-CM | POA: Insufficient documentation

## 2023-05-04 DIAGNOSIS — T82856A Stenosis of peripheral vascular stent, initial encounter: Secondary | ICD-10-CM

## 2023-05-04 DIAGNOSIS — Z87891 Personal history of nicotine dependence: Secondary | ICD-10-CM | POA: Insufficient documentation

## 2023-05-04 DIAGNOSIS — I5032 Chronic diastolic (congestive) heart failure: Secondary | ICD-10-CM | POA: Insufficient documentation

## 2023-05-04 DIAGNOSIS — I130 Hypertensive heart and chronic kidney disease with heart failure and stage 1 through stage 4 chronic kidney disease, or unspecified chronic kidney disease: Secondary | ICD-10-CM | POA: Insufficient documentation

## 2023-05-04 HISTORY — PX: ABDOMINAL AORTOGRAM W/LOWER EXTREMITY: CATH118223

## 2023-05-04 LAB — POCT I-STAT, CHEM 8
BUN: 23 mg/dL (ref 8–23)
Calcium, Ion: 1.31 mmol/L (ref 1.15–1.40)
Chloride: 104 mmol/L (ref 98–111)
Creatinine, Ser: 1.7 mg/dL — ABNORMAL HIGH (ref 0.44–1.00)
Glucose, Bld: 95 mg/dL (ref 70–99)
HCT: 32 % — ABNORMAL LOW (ref 36.0–46.0)
Hemoglobin: 10.9 g/dL — ABNORMAL LOW (ref 12.0–15.0)
Potassium: 4.2 mmol/L (ref 3.5–5.1)
Sodium: 137 mmol/L (ref 135–145)
TCO2: 25 mmol/L (ref 22–32)

## 2023-05-04 SURGERY — ABDOMINAL AORTOGRAM W/LOWER EXTREMITY
Anesthesia: LOCAL

## 2023-05-04 MED ORDER — LABETALOL HCL 5 MG/ML IV SOLN: 10.0000 mg | INTRAVENOUS | Status: DC | PRN

## 2023-05-04 MED ORDER — MIDAZOLAM HCL 2 MG/2ML IJ SOLN
INTRAMUSCULAR | Status: DC | PRN
  Administered 2023-05-04 (×2): 1 mg via INTRAVENOUS

## 2023-05-04 MED ORDER — SODIUM CHLORIDE 0.9 % WEIGHT BASED INFUSION: 1.0000 mL/kg/h | INTRAVENOUS | Status: DC

## 2023-05-04 MED ORDER — SODIUM CHLORIDE 0.9 % IV SOLN: 250.0000 mL | INTRAVENOUS | Status: DC | PRN

## 2023-05-04 MED ORDER — SODIUM CHLORIDE 0.9% FLUSH: 3.0000 mL | INTRAVENOUS | Status: DC | PRN

## 2023-05-04 MED ORDER — LIDOCAINE HCL (PF) 1 % IJ SOLN
INTRAMUSCULAR | Status: AC
  Filled 2023-05-04: qty 30

## 2023-05-04 MED ORDER — ONDANSETRON HCL 4 MG/2ML IJ SOLN: 4.0000 mg | Freq: Four times a day (QID) | INTRAMUSCULAR | Status: DC | PRN

## 2023-05-04 MED ORDER — SODIUM CHLORIDE 0.9 % IV SOLN: INTRAVENOUS | Status: DC

## 2023-05-04 MED ORDER — SODIUM CHLORIDE 0.9% FLUSH: 3.0000 mL | Freq: Two times a day (BID) | INTRAVENOUS | Status: DC

## 2023-05-04 MED ORDER — FENTANYL CITRATE (PF) 100 MCG/2ML IJ SOLN
INTRAMUSCULAR | Status: DC | PRN
  Administered 2023-05-04: 50 ug via INTRAVENOUS

## 2023-05-04 MED ORDER — MIDAZOLAM HCL 2 MG/2ML IJ SOLN
INTRAMUSCULAR | Status: AC
  Filled 2023-05-04: qty 2

## 2023-05-04 MED ORDER — FENTANYL CITRATE (PF) 100 MCG/2ML IJ SOLN
INTRAMUSCULAR | Status: AC
  Filled 2023-05-04: qty 2

## 2023-05-04 MED ORDER — IODIXANOL 320 MG/ML IV SOLN
INTRAVENOUS | Status: DC | PRN
  Administered 2023-05-04: 70 mL via INTRA_ARTERIAL

## 2023-05-04 MED ORDER — HYDRALAZINE HCL 20 MG/ML IJ SOLN
INTRAMUSCULAR | Status: DC | PRN
  Administered 2023-05-04: 10 mg via INTRAVENOUS

## 2023-05-04 MED ORDER — HYDRALAZINE HCL 20 MG/ML IJ SOLN: 5.0000 mg | INTRAMUSCULAR | Status: DC | PRN

## 2023-05-04 MED ORDER — ACETAMINOPHEN 325 MG PO TABS: 650.0000 mg | ORAL_TABLET | ORAL | Status: DC | PRN

## 2023-05-04 SURGICAL SUPPLY — 13 items
CATH OMNI FLUSH 5F 65CM (CATHETERS) IMPLANT
DEVICE CLOSURE MYNXGRIP 5F (Vascular Products) IMPLANT
GLIDEWIRE ADV .035X260CM (WIRE) IMPLANT
KIT MICROPUNCTURE NIT STIFF (SHEATH) IMPLANT
KIT PV (KITS) ×2 IMPLANT
SHEATH PINNACLE 5F 10CM (SHEATH) IMPLANT
SHEATH PROBE COVER 6X72 (BAG) IMPLANT
STOPCOCK MORSE 400PSI 3WAY (MISCELLANEOUS) IMPLANT
SYR MEDRAD MARK 7 150ML (SYRINGE) ×2 IMPLANT
TRANSDUCER W/STOPCOCK (MISCELLANEOUS) ×2 IMPLANT
TRAY PV CATH (CUSTOM PROCEDURE TRAY) ×2 IMPLANT
TUBING CIL FLEX 10 FLL-RA (TUBING) IMPLANT
WIRE BENTSON .035X145CM (WIRE) IMPLANT

## 2023-05-04 NOTE — Op Note (Signed)
    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  Ultrasound-guided micropuncture access of the left common femoral artery in retrograde fashion 2.  Aortogram 3.  Left and right iliac artery angiograms 4.  Device assisted closure-Mynx 5.  Moderate sedation time 30 minutes, contrast volume 70 mL   Indications:  Jacoya A [name] is a 82 y.o. (06-02-1941) female presenting for diagnostic angiogram after being seen in clinic with increased velocities of bilateral iliac stents, with concern for impending occlusion.  I discussed the risk and benefits of diagnostic angiogram.  He was stenosis, elected to proceed.   At [facility]:  10/06/22 Right external iliac drug-coated balloon angioplasty 6 x 40 mm, Left renal artery balloon angioplasty 5 x 20 mm, Right renal artery angioplasty 4 x 20 mm 03/30/22 Right groin exploration, redo CFA patch plasty with ipsilateral GSV (after BL CFEA at [facility])   Prior surgeries were performed at [facility] and include: Right renal artery stenting 4mm left renal artery stenting (6mm) (2015) Left subclavian artery stenting 9mm (2023) Bilateral common iliac artery stenting Bilateral external iliac stenting Left common femoral, profunda femoris, and superficial femoral artery endarterectomies Right common femoral, profunda femoris, and superficial femoral artery endarterectomies Stent placement to left external iliac artery with 8 mm diameter by 4 cm length of life star stent  Findings:  Aortogram: Patent bilateral renal artery stents with less than 50% stenosis Infrarenal aorta widely patent with mild ectasia measuring 22mm. Widely patent stents in common iliac arteries bilaterally.  Hypogastric arteries occluded bilaterally Widely patent stents in the external iliac  arteries bilaterally.  Widely patent common femoral arteries bilaterally. Widely patent profundas, occluded SFAs bilaterally   Procedure:  The patient was identified in the holding area and taken to room 8.  The patient was then placed supine on the table and prepped and draped in the usual sterile fashion.  A time out was called.  Ultrasound was used to evaluate the left common femoral artery.  It was patent .  A digital ultrasound image was acquired.  A micropuncture needle was used to access the left common femoral artery under ultrasound guidance.  An 018 wire was advanced without resistance and a micropuncture sheath was placed.  The 018 wire was removed and a benson wire was placed.  The micropuncture sheath was exchanged for a 5 french sheath.  An omniflush catheter was advanced over the wire to the level of L-1.  An abdominal angiogram was obtained.  Next, the Omni Flush catheter was pulled to the terminal aorta and dedicated imaging of the left and right iliac systems followed and multiple orthogonal views.  Finally, I performed a pullback pressure across the left common iliac artery to ensure there was no stenosis.  There was no pressure difference.    Arteriotomy managed with a minx device without issue.  Impression: Less than 50% stenosis of bilateral renal artery stents.  Widely patent infrarenal aorta, bilateral iliac artery stents.  No intervention required.    Fara Olden, MD [facility] Office: [phone]

## 2023-05-04 NOTE — H&P (Signed)
Office Note    Patient seen and examined in preop holding.  No complaints. No changes to medication history or physical exam since last seen in clinic. After discussing the risks and benefits of Aortogram with possible DCB for threatened iliac stents, Sabrina [name] elected to proceed.   Victorino Sparrow MD   HPI: Sabrina [name] is a 82 y.o. (01-30-1941) female presenting in follow up s/p right groin exploration, redo CFA patch plasty with ipsilateral GSV on 03/30/22.  Prior surgeries were performed at [facility] and include: Right renal artery stenting 4mm left renal artery stenting (6mm) (2015) Left subclavian artery stenting 9mm (2023) Bilateral common iliac artery stenting Bilateral external iliac stenting Left common femoral, profunda femoris, and superficial femoral artery endarterectomies Right common femoral, profunda femoris, and superficial femoral artery endarterectomies Stent placement to left external iliac artery with 8 mm diameter by 4 cm length of life star stent ___  09/2022 balloon angioplasty of the right EIA, bilateral renal angioplasty   On exam today, Sabrina [name] was doing okay.she continues to have bilateral lower extremity swelling, and chronic tightness.  She denied rest pain or ischemic tissue loss, but noted some claudication.  Denies food fear, weight loss.  Denies stroke, TIA, amaurosis symptoms. Denies abdominal pain, back pain.   The pt is  on a daily aspirin.   Other AC:  plavix The pt is  on medication for hypertension.   The pt is not diabetic.  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 b.) TTE 11/16/2021: EF >55%; mild AS (MPG 9 mmHg)   CAD (coronary artery disease)    a.) s/p 3v CABG 03/29/2000   Cardiac arrest [facility])    Carotid artery stenosis    a.) s/p LEFT CEA 09/09/2003. b.) Carotid doppler 14/78/2956: 1-39% LICA, CTO RICA; subclavian stenosis   Cataracts, bilateral    Cervical spondylosis without myelopathy    Chronic diastolic CHF (congestive heart failure), NYHA class 3 (HCC)    a.) TTE 05/27/2016: EF >55%, mild LA enlargement, triv PR, mild MR, mod TR; G3DD. b.) TTE 12/12/2017: EF >55%, mild LVH, BAE, mild MR/PR, mod TR; RVSP 52.8 mmHg. c.) TTE 03/18/2020: EF >55%, BAD, AS (MPG 8.7 mmHg); triv MR, mild TR/PR. d.) TTE 11/16/2021: EF >55%, LVH, G1DD, triv MR, mild PR, mod TR; AS (MPG 9 mmHg); MS (MPG 5 mmHg)   Chronic kidney disease, stage III (moderate) (HCC)    Chronic narcotic use 06/24/2014   Chronic pain syndrome    GERD (gastroesophageal reflux disease)    History of 2019 novel coronavirus disease (COVID-19) 09/22/2021   Hyperlipidemia    Hypertension    Long term current use of antithrombotics/antiplatelets    a.) on daily DAPT therapy (ASA + clopidogrel)   Lumbar stenosis with neurogenic claudication    Mitral stenosis 11/16/2021   a.) TTE 11/16/2021: EF >55%; mod MS (MPG 5 mmHg)   Osteoarthritis of hip    Postoperative wound infection 03/29/2022   Pulmonary hypertension (HCC) 12/12/2017   a.) TTE 12/12/2017: mild; RVSP 52.8 mmHg  PVD (peripheral vascular disease) (HCC)    Renal artery stenosis (HCC)    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    Past Surgical History:  Procedure Laterality Date   ABDOMINAL AORTOGRAM W/LOWER EXTREMITY N/A 10/06/2022   Procedure:  ABDOMINAL AORTOGRAM W/LOWER EXTREMITY;  Surgeon: Victorino Sparrow, MD;  Location: [facility] INVASIVE CV LAB;  Service: Cardiovascular;  Laterality: N/A;   ABDOMINAL HYSTERECTOMY  1976   CAROTID ARTERY ANGIOPLASTY Left    CAROTID ENDARTERECTOMY Left 09/09/2003   Procedure: CAROTID ENDARTERECTOMY; Location: [facility]; Surgeon: Magda Bernheim, MD   COLONOSCOPY WITH PROPOFOL N/A 08/16/2017   Procedure: COLONOSCOPY WITH PROPOFOL;  Surgeon: Midge Minium, MD;  Location: [facility] ENDOSCOPY;  Service: Endoscopy;  Laterality: N/A;   CORONARY ANGIOPLASTY WITH STENT PLACEMENT  2000   CORONARY ARTERY BYPASS GRAFT N/A 03/29/2000   Procedure: 3v CORONARY ARTERY BYPASS GRAFT; Location: [facility]   CYSTOSCOPY WITH STENT PLACEMENT Bilateral    ENDARTERECTOMY FEMORAL Bilateral 03/17/2022   Procedure: ENDARTERECTOMY FEMORAL ( BILATERAL SFA STENT);  Surgeon: Renford Dills, MD;  Location: [facility] ORS;  Service: Vascular;  Laterality: Bilateral;   ENDARTERECTOMY FEMORAL Right 03/30/2022   Procedure: RE-EXPOSURE OF RIGHT COMMON FEMORAL ARTERY, RE-DO RIGHT FEMORAL ENDARTERECTOMY WITH VEIN PATCH;  Surgeon: Victorino Sparrow, MD;  Location: [facility] OR;  Service: Vascular;  Laterality: Right;  WOUND VAC   ESOPHAGOGASTRODUODENOSCOPY (EGD) WITH PROPOFOL N/A 08/16/2017   Procedure: ESOPHAGOGASTRODUODENOSCOPY (EGD) WITH PROPOFOL;  Surgeon: Midge Minium, MD;  Location: [facility] ENDOSCOPY;  Service: Endoscopy;  Laterality: N/A;   ESOPHAGOGASTRODUODENOSCOPY (EGD) WITH PROPOFOL N/A 06/29/2018   Procedure: ESOPHAGOGASTRODUODENOSCOPY (EGD) WITH PROPOFOL;  Surgeon: Pasty Spillers, MD;  Location: [facility] ENDOSCOPY;  Service: Endoscopy;  Laterality: N/A;   GROIN DEBRIDEMENT Right 03/30/2022   Procedure: RIGHT GROIN IRRIGATION & DEBRIDEMENT;  Surgeon: Victorino Sparrow, MD;  Location: [facility] OR;  Service: Vascular;  Laterality: Right;   INSERTION OF ILIAC STENT Left 03/17/2022   Procedure: INSERTION OF ILIAC STENT;  Surgeon: Renford Dills, MD;  Location: [facility] ORS;   Service: Vascular;  Laterality: Left;   LAPAROSCOPIC CHOLECYSTECTOMY Left 10/26/1999   Procedure: LAPAROSCOPIC CHOLECYSTECTOMY; Location: [facility]; Surgeon: Renda Rolls, MD   LEFT HEART CATH AND CORONARY ANGIOGRAPHY N/A 11/18/2021   Procedure: LEFT HEART CATH AND CORONARY ANGIOGRAPHY;  Surgeon: Alwyn Pea, MD;  Location: [facility] INVASIVE CV LAB;  Service: Cardiovascular;  Laterality: N/A;   LEFT HEART CATH AND CORS/GRAFTS ANGIOGRAPHY Left 11/19/2002   Procedure: LEFT HEART CATH AND CORS/GRAFTS ANGIOGRAPHY; Location: [facility]; Surgeon: Rudean Hitt, MD   LEFT HEART CATH AND CORS/GRAFTS ANGIOGRAPHY Left 09/17/2003   Procedure: LEFT HEART CATH AND CORS/GRAFTS ANGIOGRAPHY; Location: [facility]; Surgeon: Rudean Hitt, MD   LOWER EXTREMITY ANGIOGRAPHY Right 01/06/2022   Procedure: Lower Extremity Angiography;  Surgeon: Renford Dills, MD;  Location: [facility] INVASIVE CV LAB;  Service: Cardiovascular;  Laterality: Right;   PERIPHERAL VASCULAR BALLOON ANGIOPLASTY  10/06/2022   Procedure: PERIPHERAL VASCULAR BALLOON ANGIOPLASTY;  Surgeon: Victorino Sparrow, MD;  Location: [facility] INVASIVE CV LAB;  Service: Cardiovascular;;  left iliac and bilateral renal   RENAL ARTERY ANGIOPLASTY Bilateral 12/2013   TOE AMPUTATION Right    small toe   TONSILLECTOMY AND ADENOIDECTOMY     TOTAL HIP ARTHROPLASTY Left 2005  TOTAL HIP ARTHROPLASTY Right 2015   UPPER EXTREMITY ANGIOGRAPHY Left 11/23/2021   Procedure: UPPER EXTREMITY ANGIOGRAPHY;  Surgeon: Annice Needy, MD;  Location: [facility] INVASIVE CV LAB;  Service: Cardiovascular;  Laterality: Left;    Social History   Socioeconomic History   Marital status: Divorced    Spouse name: Not on file   Number of children: 3   Years of education: Not on file   Highest education level: Not on file  Occupational History   Occupation: retired  Tobacco Use   Smoking status: Former    Types: Cigarettes    Quit date: 10/25/1998    Years since quitting: 24.5   Smokeless tobacco:  Never  Vaping Use   Vaping Use: Never used  Substance and Sexual Activity   Alcohol use: No    Alcohol/week: 0.0 standard drinks of alcohol   Drug use: Never   Sexual activity: Not Currently  Other Topics Concern   Not on file  Social History Narrative   Daughter Zella Ball ([place]); 1 in [employer]; 1 in [place]   Lives alone   Social Determinants of Health   Financial Resource Strain: Low Risk  (09/22/2017)   Overall Financial Resource Strain (CARDIA)    Difficulty of Paying Living Expenses: Not hard at all  Food Insecurity: No Food Insecurity (08/23/2022)   Hunger Vital Sign    Worried About Running Out of Food in the Last Year: Never true    Ran Out of Food in the Last Year: Never true  Transportation Needs: No Transportation Needs (08/23/2022)   PRAPARE - Administrator, Civil Service (Medical): No    Lack of Transportation (Non-Medical): No  Physical Activity: Inactive (09/22/2017)   Exercise Vital Sign    Days of Exercise per Week: 0 days    Minutes of Exercise per Session: 0 min  Stress: Unknown (09/22/2017)   [employer] of Occupational Health - Occupational Stress Questionnaire    Feeling of Stress : Patient declined  Social Connections: Unknown (09/22/2017)   Social Connection and Isolation Panel [NHANES]    Frequency of Communication with Friends and Family: Patient declined    Frequency of Social Gatherings with Friends and Family: Patient declined    Attends Religious Services: Patient declined    Database administrator or Organizations: Patient declined    Attends Banker Meetings: Patient declined    Marital Status: Patient declined  Intimate Partner Violence: Not At Risk (09/22/2017)   Humiliation, Afraid, Rape, and Kick questionnaire    Fear of Current or Ex-Partner: No    Emotionally Abused: No    Physically Abused: No    Sexually Abused: No    Family History  Problem Relation Age of Onset   Stroke Mother     Hypertension Mother    Diabetes Mother    Hypertension Father    Heart disease Sister        MI   Multiple sclerosis Daughter    Multiple sclerosis Son    Cerebral aneurysm Son    Seizures Son    Cerebral aneurysm Son    Breast cancer Paternal Aunt 65    Current Facility-Administered Medications  Medication Dose Route Frequency Provider Last Rate Last Admin   0.9 %  sodium chloride infusion   Intravenous Continuous Victorino Sparrow, MD 100 mL/hr at 05/04/23 1003 New Bag at 05/04/23 1003    Allergies  Allergen Reactions   Celexa [Citalopram] Anaphylaxis    Throat closing  Dilaudid [Hydromorphone Hcl] Nausea And Vomiting   Effient [Prasugrel] Itching   Hydrochlorothiazide Other (See Comments)    Decreased GFR (Nov 2015)   Liothyronine     Hair fell out, caused headaches    Nsaids     CKD stage III - avoid nephrotoxic drugs   Nubain [Nalbuphine Hcl]     Burning sensation in back   Penicillins Itching   Statins Itching     REVIEW OF SYSTEMS:   [X]  denotes positive finding, [ ]  denotes negative finding Cardiac  Comments:  Chest pain or chest pressure:    Shortness of breath upon exertion:    Short of breath when lying flat:    Irregular heart rhythm:        Vascular    Pain in calf, thigh, or hip brought on by ambulation:    Pain in feet at night that wakes you up from your sleep:     Blood clot in your veins:    Leg swelling:         Pulmonary    Oxygen at home:    Productive cough:     Wheezing:         Neurologic    Sudden weakness in arms or legs:     Sudden numbness in arms or legs:     Sudden onset of difficulty speaking or slurred speech:    Temporary loss of vision in one eye:     Problems with dizziness:         Gastrointestinal    Blood in stool:     Vomited blood:         Genitourinary    Burning when urinating:     Blood in urine:        Psychiatric    Major depression:         Hematologic    Bleeding problems:    Problems with  blood clotting too easily:        Skin    Rashes or ulcers:        Constitutional    Fever or chills:      PHYSICAL EXAMINATION:  Vitals:   05/04/23 0930  BP: (!) 166/67  Pulse: 61  Temp: 98.7 F (37.1 C)  TempSrc: Oral  SpO2: 99%  Weight: 77.1 kg  Height: 5\' 1"  (1.549 m)     General:  WDWN in NAD; vital signs documented above Gait: Not observed HENT: WNL, normocephalic Pulmonary: normal non-labored breathing , without wheezing Cardiac: regular HR, Abdomen: soft, NT, no masses Skin: without rashes Vascular Exam/Pulses:  Right Left  Radial 2+ (normal) 2+ (normal)  Ulnar 2+ (normal) 2+ (normal)  Femoral    Popliteal    DP nonpalpable nonpalpable  PT nonpalpable nonpalpable   Extremities: without ischemic changes, without Gangrene , without cellulitis; without open wounds;  Right groin wound with 3 small areas opened, probe less than 1 cm, Musculoskeletal: no muscle wasting or atrophy  Neurologic: A&O X 3;  No focal weakness or paresthesias are detected Psychiatric:  The pt has Normal affect.   Non-Invasive Vascular Imaging:    Summary:  Right Carotid: Evidence consistent with a total occlusion of the right  ICA.   Left Carotid: Velocities in the left ICA are consistent with a 1-39%  stenosis.   +-------+-----------+-----------+------------+------------+  ABI/TBIToday's ABIToday's TBIPrevious ABIPrevious TBI  +-------+-----------+-----------+------------+------------+  Right 0.53       0.38       0.54  0.39          +-------+-----------+-----------+------------+------------+  Left  0.60       0.40       0.59        0.43          +-------+-----------+-----------+------------+------------+   Abdominal Aorta Findings:  Abdominal Aorta Findings:  +-------------+-------+----------+----------+----------+--------+--------+  Location    AP (cm)Trans (cm)PSV (cm/s)Waveform  ThrombusComments   +-------------+-------+----------+----------+----------+--------+--------+  Distal                       141                                   +-------------+-------+----------+----------+----------+--------+--------+  RT CIA Prox                   181       monophasic                  +-------------+-------+----------+----------+----------+--------+--------+  RT CIA Mid                    225       monophasic                  +-------------+-------+----------+----------+----------+--------+--------+  RT CIA Distal                 339       monophasic                  +-------------+-------+----------+----------+----------+--------+--------+  RT EIA Prox                   210       monophasic                  +-------------+-------+----------+----------+----------+--------+--------+    +-------------+-------+----------+----------+----------+--------+--------+  RT EIA Distal                 104       monophasic                  +-------------+-------+----------+----------+----------+--------+--------+  LT CIA Prox                   215       monophasic                  +-------------+-------+----------+----------+----------+--------+--------+  LT CIA Mid                    227       monophasic                  +-------------+-------+----------+----------+----------+--------+--------+  LT CIA Distal                 240       monophasic                  +-------------+-------+----------+----------+----------+--------+--------+  LT EIA Distal                 54        monophasic                  +-------------+-------+----------+----------+----------+--------+--------+     Left Stent(s):  +---------------+--------+---------------+----------+--------+  EIA dst stent  PSV cm/sStenosis       Waveform  Comments  +---------------+--------+---------------+----------+--------+  Prox to Stent  219  monophasic          +---------------+--------+---------------+----------+--------+  Proximal Stent 371     50-99% stenosismonophasic          +---------------+--------+---------------+----------+--------+  Mid Stent      310     50-99% stenosismonophasic          +---------------+--------+---------------+----------+--------+  Distal Stent   306     50-99% stenosismonophasic          +---------------+--------+---------------+----------+--------+  Distal to Stent284     50-99% stenosismonophasic          +---------------+--------+---------------+----------+--------+     ASSESSMENT/PLAN: Sabrina [name] is a 82 y.o. female presenting in follow up s/p right groin exploration, redo CFA patch plasty with ipsilateral GSV.  In December 2023 she underwent right-sided EIA drug-coated balloon angioplasty, bilateral renal artery angioplasty for assisted primary patency of bilateral renal artery stents.  Arnold has undergone multiple lower extremity operations-see above.  At her last visit, she had elevated velocities in the right common iliac artery at the level of the previously placed stents.  I decided to treat conservatively with plans to repeat angiography should velocities increase.  On today's duplex ultrasound, velocities and increased in the left-sided and right-sided stents.  By definition, the stents are threatened, and I am concerned that the current level of stenosis could result in occlusion.  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  After discussing risk and benefits of bilateral lower extremity angiography with emphasis on iliac artery stenting bilaterally, devils elected to proceed.  CKD unchanged, no concern for TIA, stroke, amaurosis -plan for renal stent and carotid duplex at her next visit.   Victorino Sparrow, MD [facility] [phone]  Total time of patient care including pre-visit research, consultation, and documentation greater than 40 minutes

## 2023-05-04 NOTE — Progress Notes (Signed)
Patient and daughter was given discharge instructions. Both verbalized understanding. 

## 2023-05-05 ENCOUNTER — Encounter (HOSPITAL_COMMUNITY): Payer: Self-pay | Admitting: Vascular Surgery

## 2023-05-05 MED FILL — Lidocaine HCl Local Preservative Free (PF) Inj 1%: INTRAMUSCULAR | Qty: 30 | Status: AC

## 2023-05-10 ENCOUNTER — Ambulatory Visit (INDEPENDENT_AMBULATORY_CARE_PROVIDER_SITE_OTHER): Payer: Medicare Other | Admitting: *Deleted

## 2023-05-10 VITALS — Ht 61.0 in | Wt 170.0 lb

## 2023-05-10 DIAGNOSIS — Z0000 Encounter for general adult medical examination without abnormal findings: Secondary | ICD-10-CM | POA: Diagnosis not present

## 2023-05-10 NOTE — Patient Instructions (Addendum)
Per patient no change in vitals since last visit, unable to obtain new vitals due to telehealth visit  Sabrina [name] , Thank you for taking time to come for your Medicare Wellness Visit. I appreciate your ongoing commitment to your health goals. Please review the following plan we discussed and let me know if I can assist you in the future.   These are the goals we discussed:  Goals      Increase physical activity     Stay active.  STRETCH! Chair exercises as demonstrated.  Increase as tolerated.       Increase water intake     Stay hydrated and drink plenty of fluids/water     INCREASE WATER INTAKE     Finish entire cup of water when taking medications Stay hydrated and drink plenty of fluids     Patient Stated     None     Patient stated:  Management of health conditions     Care Coordination Interventions: Patient advised to continue to monitor blood pressure and record.  Advised to notify provider for blood pressure outside of normal parameters as discussed Reviewed medications and discussed importance of compliance Reviewed scheduled/ upcoming appointments Advised to use heat/ cold packs for ongoing back pain.           This is a list of the screening recommended for you and due dates:  Health Maintenance  Topic Date Due   Zoster (Shingles) Vaccine (1 of 2) Never done   DEXA scan (bone density measurement)  Never done   COVID-19 Vaccine (4 - 2023-24 season) 06/25/2022   Mammogram  02/19/2023   Pneumonia Vaccine (2 of 2 - PPSV23 or PCV20) 09/24/2023*   Flu Shot  05/26/2023   DTaP/Tdap/Td vaccine (2 - Td or Tdap) 01/11/2024   Medicare Annual Wellness Visit  05/09/2024   HPV Vaccine  Aged Out  *Topic was postponed. The date shown is not the original due date.    Advanced directives: Will work on  Conditions/risks identified: None  Next appointment: Follow up in one year for your annual wellness visit 05/14/24 @ 3:00Managing Pain Without Opioids Opioids are strong  medicines used to treat moderate to severe pain. For some people, especially those who have long-term (chronic) pain, opioids may not be the best choice for pain management due to: Side effects like nausea, constipation, and sleepiness. The risk of addiction (opioid use disorder). The longer you take opioids, the greater your risk of addiction. Pain that lasts for more than 3 months is called chronic pain. Managing chronic pain usually requires more than one approach and is often provided by a team of health care providers working together (multidisciplinary approach). Pain management may be done at a pain management center or pain clinic. How to manage pain without the use of opioids Use non-opioid medicines Non-opioid medicines for pain may include: Over-the-counter or prescription non-steroidal anti-inflammatory drugs (NSAIDs). These may be the first medicines used for pain. They work well for muscle and bone pain, and they reduce swelling. Acetaminophen. This over-the-counter medicine may work well for milder pain but not swelling. Antidepressants. These may be used to treat chronic pain. A certain type of antidepressant (tricyclics) is often used. These medicines are given in lower doses for pain than when used for depression. Anticonvulsants. These are usually used to treat seizures but may also reduce nerve (neuropathic) pain. Muscle relaxants. These relieve pain caused by sudden muscle tightening (spasms). You may also use a pain medicine that  is applied to the skin as a patch, cream, or gel (topical analgesic), such as a numbing medicine. These may cause fewer side effects than medicines taken by mouth. Do certain therapies as directed Some therapies can help with pain management. They include: Physical therapy. You will do exercises to gain strength and flexibility. A physical therapist may teach you exercises to move and stretch parts of your body that are weak, stiff, or painful. You can  learn these exercises at physical therapy visits and practice them at home. Physical therapy may also involve: Massage. Heat wraps or applying heat or cold to affected areas. Electrical signals that interrupt pain signals (transcutaneous electrical nerve stimulation, TENS). Weak lasers that reduce pain and swelling (low-level laser therapy). Signals from your body that help you learn to regulate pain (biofeedback). Occupational therapy. This helps you to learn ways to function at home and work with less pain. Recreational therapy. This involves trying new activities or hobbies, such as a physical activity or drawing. Mental health therapy, including: Cognitive behavioral therapy (CBT). This helps you learn coping skills for dealing with pain. Acceptance and commitment therapy (ACT) to change the way you think and react to pain. Relaxation therapies, including muscle relaxation exercises and mindfulness-based stress reduction. Pain management counseling. This may be individual, family, or group counseling.  Receive medical treatments Medical treatments for pain management include: Nerve block injections. These may include a pain blocker and anti-inflammatory medicines. You may have injections: Near the spine to relieve chronic back or neck pain. Into joints to relieve back or joint pain. Into nerve areas that supply a painful area to relieve body pain. Into muscles (trigger point injections) to relieve some painful muscle conditions. A medical device placed near your spine to help block pain signals and relieve nerve pain or chronic back pain (spinal cord stimulation device). Acupuncture. Follow these instructions at home Medicines Take over-the-counter and prescription medicines only as told by your health care provider. If you are taking pain medicine, ask your health care providers about possible side effects to watch out for. Do not drive or use heavy machinery while taking prescription  opioid pain medicine. Lifestyle  Do not use drugs or alcohol to reduce pain. If you drink alcohol, limit how much you have to: 0-1 drink a day for women who are not pregnant. 0-2 drinks a day for men. Know how much alcohol is in a drink. In the [place] one drink equals one 12 oz bottle of beer (355 mL), one 5 oz glass of wine (148 mL), or one 1 oz glass of hard liquor (44 mL). Do not use any products that contain nicotine or tobacco. These products include cigarettes, chewing tobacco, and vaping devices, such as e-cigarettes. If you need help quitting, ask your health care provider. Eat a healthy diet and maintain a healthy weight. Poor diet and excess weight may make pain worse. Eat foods that are high in fiber. These include fresh fruits and vegetables, whole grains, and beans. Limit foods that are high in fat and processed sugars, such as fried and sweet foods. Exercise regularly. Exercise lowers stress and may help relieve pain. Ask your health care provider what activities and exercises are safe for you. If your health care provider approves, join an exercise class that combines movement and stress reduction. Examples include yoga and tai chi. Get enough sleep. Lack of sleep may make pain worse. Lower stress as much as possible. Practice stress reduction techniques as told by your therapist.  General instructions Work with all your pain management providers to find the treatments that work best for you. You are an important member of your pain management team. There are many things you can do to reduce pain on your own. Consider joining an online or in-person support group for people who have chronic pain. Keep all follow-up visits. This is important. Where to find more information You can find more information about managing pain without opioids from: American Academy of Pain Medicine: painmed.org Institute for Chronic Pain: instituteforchronicpain.org American Chronic Pain Association:  theacpa.org Contact a health care provider if: You have side effects from pain medicine. Your pain gets worse or does not get better with treatments or home therapy. You are struggling with anxiety or depression. Summary Many types of pain can be managed without opioids. Chronic pain may respond better to pain management without opioids. Pain is best managed when you and a team of health care providers work together. Pain management without opioids may include non-opioid medicines, medical treatments, physical therapy, mental health therapy, and lifestyle changes. Tell your health care providers if your pain gets worse or is not being managed well enough. This information is not intended to replace advice given to you by your health care provider. Make sure you discuss any questions you have with your health care provider. Document Revised: 01/21/2021 Document Reviewed: 01/21/2021 Elsevier Patient Education  2024 [employer].    Preventive Care 65 Years and Older, Female Preventive care refers to lifestyle choices and visits with your health care provider that can promote health and wellness. What does preventive care include? A yearly physical exam. This is also called an annual well check. Dental exams once or twice a year. Routine eye exams. Ask your health care provider how often you should have your eyes checked. Personal lifestyle choices, including: Daily care of your teeth and gums. Regular physical activity. Eating a healthy diet. Avoiding tobacco and drug use. Limiting alcohol use. Practicing safe sex. Taking low-dose aspirin every day. Taking vitamin and mineral supplements as recommended by your health care provider. What happens during an annual well check? The services and screenings done by your health care provider during your annual well check will depend on your age, overall health, lifestyle risk factors, and family history of disease. Counseling  Your health  care provider may ask you questions about your: Alcohol use. Tobacco use. Drug use. Emotional well-being. Home and relationship well-being. Sexual activity. Eating habits. History of falls. Memory and ability to understand (cognition). Work and work Astronomer. Reproductive health. Screening  You may have the following tests or measurements: Height, weight, and BMI. Blood pressure. Lipid and cholesterol levels. These may be checked every 5 years, or more frequently if you are over 35 years old. Skin check. Lung cancer screening. You may have this screening every year starting at age 27 if you have a 30-pack-year history of smoking and currently smoke or have quit within the past 15 years. Fecal occult blood test (FOBT) of the stool. You may have this test every year starting at age 31. Flexible sigmoidoscopy or colonoscopy. You may have a sigmoidoscopy every 5 years or a colonoscopy every 10 years starting at age 55. Hepatitis C blood test. Hepatitis B blood test. Sexually transmitted disease (STD) testing. Diabetes screening. This is done by checking your blood sugar (glucose) after you have not eaten for a while (fasting). You may have this done every 1-3 years. Bone density scan. This is done to screen for  osteoporosis. You may have this done starting at age 32. Mammogram. This may be done every 1-2 years. Talk to your health care provider about how often you should have regular mammograms. Talk with your health care provider about your test results, treatment options, and if necessary, the need for more tests. Vaccines  Your health care provider may recommend certain vaccines, such as: Influenza vaccine. This is recommended every year. Tetanus, diphtheria, and acellular pertussis (Tdap, Td) vaccine. You may need a Td booster every 10 years. Zoster vaccine. You may need this after age 63. Pneumococcal 13-valent conjugate (PCV13) vaccine. One dose is recommended after age  23. Pneumococcal polysaccharide (PPSV23) vaccine. One dose is recommended after age 29. Talk to your health care provider about which screenings and vaccines you need and how often you need them. This information is not intended to replace advice given to you by your health care provider. Make sure you discuss any questions you have with your health care provider. Document Released: 11/07/2015 Document Revised: 06/30/2016 Document Reviewed: 08/12/2015 Elsevier Interactive Patient Education  2017 [employer].  Fall Prevention in the Home Falls can cause injuries. They can happen to people of all ages. There are many things you can do to make your home safe and to help prevent falls. What can I do on the outside of my home? Regularly fix the edges of walkways and driveways and fix any cracks. Remove anything that might make you trip as you walk through a door, such as a raised step or threshold. Trim any bushes or trees on the path to your home. Use bright outdoor lighting. Clear any walking paths of anything that might make someone trip, such as rocks or tools. Regularly check to see if handrails are loose or broken. Make sure that both sides of any steps have handrails. Any raised decks and porches should have guardrails on the edges. Have any leaves, snow, or ice cleared regularly. Use sand or salt on walking paths during winter. Clean up any spills in your garage right away. This includes oil or grease spills. What can I do in the bathroom? Use night lights. Install grab bars by the toilet and in the tub and shower. Do not use towel bars as grab bars. Use non-skid mats or decals in the tub or shower. If you need to sit down in the shower, use a plastic, non-slip stool. Keep the floor dry. Clean up any water that spills on the floor as soon as it happens. Remove soap buildup in the tub or shower regularly. Attach bath mats securely with double-sided non-slip rug tape. Do not have throw  rugs and other things on the floor that can make you trip. What can I do in the bedroom? Use night lights. Make sure that you have a light by your bed that is easy to reach. Do not use any sheets or blankets that are too big for your bed. They should not hang down onto the floor. Have a firm chair that has side arms. You can use this for support while you get dressed. Do not have throw rugs and other things on the floor that can make you trip. What can I do in the kitchen? Clean up any spills right away. Avoid walking on wet floors. Keep items that you use a lot in easy-to-reach places. If you need to reach something above you, use a strong step stool that has a grab bar. Keep electrical cords out of the way. Do not  use floor polish or wax that makes floors slippery. If you must use wax, use non-skid floor wax. Do not have throw rugs and other things on the floor that can make you trip. What can I do with my stairs? Do not leave any items on the stairs. Make sure that there are handrails on both sides of the stairs and use them. Fix handrails that are broken or loose. Make sure that handrails are as long as the stairways. Check any carpeting to make sure that it is firmly attached to the stairs. Fix any carpet that is loose or worn. Avoid having throw rugs at the top or bottom of the stairs. If you do have throw rugs, attach them to the floor with carpet tape. Make sure that you have a light switch at the top of the stairs and the bottom of the stairs. If you do not have them, ask someone to add them for you. What else can I do to help prevent falls? Wear shoes that: Do not have high heels. Have rubber bottoms. Are comfortable and fit you well. Are closed at the toe. Do not wear sandals. If you use a stepladder: Make sure that it is fully opened. Do not climb a closed stepladder. Make sure that both sides of the stepladder are locked into place. Ask someone to hold it for you, if  possible. Clearly mark and make sure that you can see: Any grab bars or handrails. First and last steps. Where the edge of each step is. Use tools that help you move around (mobility aids) if they are needed. These include: Canes. Walkers. Scooters. Crutches. Turn on the lights when you go into a dark area. Replace any light bulbs as soon as they burn out. Set up your furniture so you have a clear path. Avoid moving your furniture around. If any of your floors are uneven, fix them. If there are any pets around you, be aware of where they are. Review your medicines with your doctor. Some medicines can make you feel dizzy. This can increase your chance of falling. Ask your doctor what other things that you can do to help prevent falls. This information is not intended to replace advice given to you by your health care provider. Make sure you discuss any questions you have with your health care provider. Document Released: 08/07/2009 Document Revised: 03/18/2016 Document Reviewed: 11/15/2014 Elsevier Interactive Patient Education  2017 [employer].

## 2023-05-10 NOTE — Progress Notes (Addendum)
Subjective:   Sabrina [name] is a 82 y.o. female who presents for Medicare Annual (Subsequent) preventive examination.  Visit Complete: Virtual  I connected with  Sabrina [name] on 05/10/23 by a audio enabled telemedicine application and verified that I am speaking with the correct person using two identifiers.  Patient Location: Home  Provider Location: Office/Clinic  I discussed the limitations of evaluation and management by telemedicine. The patient expressed understanding and agreed to proceed.  Review of Systems     Cardiac Risk Factors include: advanced age (>39men, >41 women);dyslipidemia;hypertension;obesity (BMI >30kg/m2);sedentary lifestyle;Other (see comment), Risk factor comments: Palpitations , aortic valve disorder     Objective:    Today's Vitals   05/10/23 1432 05/10/23 1433  Weight: 170 lb (77.1 kg)   Height: 5\' 1"  (1.549 m)   PainSc:  10-Worst pain ever   Body mass index is 32.12 kg/m.     05/10/2023    2:47 PM 05/04/2023    9:31 AM 10/06/2022    6:18 AM 09/22/2022    7:03 AM 07/26/2022    1:14 PM 04/05/2022    3:24 PM 03/30/2022    4:00 AM  Advanced Directives  Does Patient Have a Medical Advance Directive? No No No No No No No  Would patient like information on creating a medical advance directive?  No - Patient declined No - Patient declined No - Patient declined No - Patient declined No - Patient declined No - Patient declined    Current Medications (verified) Outpatient Encounter Medications as of 05/10/2023  Medication Sig   amLODipine (NORVASC) 10 MG tablet Take 1 tablet (10 mg total) by mouth at bedtime.   aspirin 81 MG tablet Take 81 mg by mouth in the morning.   clopidogrel (PLAVIX) 75 MG tablet Take 1 tablet (75 mg total) by mouth at bedtime. To prevent strokes   DULoxetine (CYMBALTA) 30 MG capsule Take 1 capsule (30 mg total) by mouth daily.   ezetimibe (ZETIA) 10 MG tablet Take 1 tablet (10 mg total) by mouth at bedtime.    HYDROcodone-acetaminophen (NORCO) 10-325 MG tablet Take 1 tablet by mouth daily as needed for moderate pain.   labetalol (NORMODYNE) 300 MG tablet Take 1 tablet (300 mg total) by mouth 2 (two) times daily. For hypertension   losartan (COZAAR) 50 MG tablet Take 1 tablet (50 mg total) by mouth at bedtime.   nortriptyline (PAMELOR) 10 MG capsule Take 1 capsule (10 mg total) by mouth 2 (two) times daily. For chronic headaches   pantoprazole (PROTONIX) 40 MG tablet One tablet upon waking,  take 30 minuts prior to eating   ranolazine (RANEXA) 500 MG 12 hr tablet Take 1 tablet (500 mg total) by mouth 2 (two) times daily.   triamcinolone cream (KENALOG) 0.1 % Apply 1 Application topically 2 (two) times daily. To all areas of rash   No facility-administered encounter medications on file as of 05/10/2023.    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 b.) TTE 11/16/2021: EF >55%; mild AS (MPG 9 mmHg)   CAD (coronary artery disease)    a.) s/p 3v CABG 03/29/2000   Cardiac arrest [facility])    Carotid artery stenosis    a.) s/p LEFT CEA 09/09/2003. b.) Carotid doppler 78/29/5621: 1-39% LICA, CTO RICA; subclavian stenosis   Cataracts, bilateral    Cervical spondylosis without myelopathy    Chronic diastolic CHF (congestive heart failure), NYHA class 3 (HCC)    a.) TTE 05/27/2016: EF >55%, mild LA enlargement,  triv PR, mild MR, mod TR; G3DD. b.) TTE 12/12/2017: EF >55%, mild LVH, BAE, mild MR/PR, mod TR; RVSP 52.8 mmHg. c.) TTE 03/18/2020: EF >55%, BAD, AS (MPG 8.7 mmHg); triv MR, mild TR/PR. d.) TTE 11/16/2021: EF >55%, LVH, G1DD, triv MR, mild PR, mod TR; AS (MPG 9 mmHg); MS (MPG 5 mmHg)   Chronic kidney disease, stage III (moderate) (HCC)    Chronic narcotic use 06/24/2014   Chronic pain syndrome    GERD (gastroesophageal reflux disease)    History of 2019 novel coronavirus disease (COVID-19) 09/22/2021   Hyperlipidemia    Hypertension    Long term current use of antithrombotics/antiplatelets    a.) on daily DAPT therapy (ASA + clopidogrel)   Lumbar stenosis with neurogenic claudication    Mitral stenosis 11/16/2021   a.) TTE 11/16/2021: EF >55%; mod MS (MPG 5 mmHg)   Osteoarthritis of hip    Postoperative wound infection 03/29/2022   Pulmonary hypertension (HCC) 12/12/2017   a.) TTE 12/12/2017: mild; RVSP 52.8 mmHg   PVD (peripheral vascular disease) (HCC)    Renal artery stenosis (HCC)    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   Past Surgical History:  Procedure Laterality Date   ABDOMINAL AORTOGRAM W/LOWER EXTREMITY N/A 10/06/2022   Procedure: ABDOMINAL AORTOGRAM W/LOWER EXTREMITY;  Surgeon: Victorino Sparrow, MD;  Location: [facility] INVASIVE CV LAB;  Service: Cardiovascular;  Laterality: N/A;   ABDOMINAL AORTOGRAM W/LOWER EXTREMITY N/A 05/04/2023   Procedure: ABDOMINAL AORTOGRAM W/LOWER EXTREMITY;  Surgeon: Victorino Sparrow, MD;  Location: [facility] INVASIVE CV LAB;  Service: Cardiovascular;  Laterality: N/A;   ABDOMINAL HYSTERECTOMY  1976   CAROTID ARTERY ANGIOPLASTY Left    CAROTID ENDARTERECTOMY Left 09/09/2003   Procedure: CAROTID ENDARTERECTOMY; Location: [facility]; Surgeon: Magda Bernheim, MD   COLONOSCOPY WITH PROPOFOL N/A 08/16/2017   Procedure: COLONOSCOPY WITH PROPOFOL;  Surgeon: Midge Minium, MD;  Location: [facility] ENDOSCOPY;  Service: Endoscopy;  Laterality: N/A;   CORONARY ANGIOPLASTY WITH STENT PLACEMENT  2000   CORONARY ARTERY BYPASS GRAFT N/A 03/29/2000   Procedure: 3v CORONARY ARTERY BYPASS GRAFT; Location: [facility]   CYSTOSCOPY WITH STENT PLACEMENT Bilateral    ENDARTERECTOMY FEMORAL Bilateral 03/17/2022   Procedure: ENDARTERECTOMY FEMORAL ( BILATERAL SFA STENT);  Surgeon: Renford Dills, MD;  Location: [facility] ORS;  Service: Vascular;  Laterality: Bilateral;   ENDARTERECTOMY FEMORAL Right 03/30/2022   Procedure: RE-EXPOSURE OF RIGHT COMMON FEMORAL ARTERY, RE-DO RIGHT FEMORAL ENDARTERECTOMY WITH VEIN PATCH;  Surgeon: Victorino Sparrow, MD;  Location: [facility] OR;  Service: Vascular;  Laterality: Right;  WOUND VAC   ESOPHAGOGASTRODUODENOSCOPY (EGD) WITH PROPOFOL N/A 08/16/2017   Procedure: ESOPHAGOGASTRODUODENOSCOPY (EGD) WITH PROPOFOL;  Surgeon: Midge Minium, MD;  Location: [facility] ENDOSCOPY;  Service: Endoscopy;  Laterality: N/A;   ESOPHAGOGASTRODUODENOSCOPY (EGD) WITH PROPOFOL N/A 06/29/2018   Procedure: ESOPHAGOGASTRODUODENOSCOPY (EGD) WITH PROPOFOL;  Surgeon: Pasty Spillers, MD;  Location: [facility] ENDOSCOPY;  Service: Endoscopy;  Laterality: N/A;   GROIN DEBRIDEMENT Right 03/30/2022   Procedure: RIGHT GROIN IRRIGATION & DEBRIDEMENT;  Surgeon: Victorino Sparrow, MD;  Location: [facility] OR;  Service: Vascular;  Laterality: Right;   INSERTION OF ILIAC STENT Left 03/17/2022   Procedure: INSERTION OF ILIAC STENT;  Surgeon: Renford Dills, MD;  Location: [facility] ORS;  Service: Vascular;  Laterality: Left;   LAPAROSCOPIC CHOLECYSTECTOMY Left 10/26/1999   Procedure: LAPAROSCOPIC CHOLECYSTECTOMY; Location: [facility]; Surgeon: Renda Rolls, MD   LEFT HEART CATH AND CORONARY ANGIOGRAPHY N/A 11/18/2021   Procedure: LEFT HEART CATH AND CORONARY ANGIOGRAPHY;  Surgeon: Alwyn Pea, MD;   Location: [facility] INVASIVE CV LAB;  Service: Cardiovascular;  Laterality: N/A;   LEFT HEART CATH AND CORS/GRAFTS ANGIOGRAPHY Left 11/19/2002   Procedure: LEFT HEART CATH AND CORS/GRAFTS ANGIOGRAPHY; Location: [facility]; Surgeon: Rudean Hitt, MD   LEFT HEART CATH AND CORS/GRAFTS ANGIOGRAPHY Left 09/17/2003   Procedure: LEFT HEART CATH AND CORS/GRAFTS ANGIOGRAPHY; Location: [facility]; Surgeon: Rudean Hitt, MD   LOWER EXTREMITY ANGIOGRAPHY Right 01/06/2022   Procedure: Lower Extremity Angiography;  Surgeon: Renford Dills, MD;  Location: [facility] INVASIVE CV LAB;  Service: Cardiovascular;  Laterality: Right;   PERIPHERAL VASCULAR BALLOON ANGIOPLASTY  10/06/2022   Procedure: PERIPHERAL VASCULAR BALLOON ANGIOPLASTY;  Surgeon: Victorino Sparrow, MD;  Location: [facility] INVASIVE CV LAB;  Service: Cardiovascular;;  left iliac and bilateral renal   RENAL ARTERY ANGIOPLASTY Bilateral 12/2013   TOE AMPUTATION Right    small toe   TONSILLECTOMY AND ADENOIDECTOMY     TOTAL HIP ARTHROPLASTY Left 2005   TOTAL HIP ARTHROPLASTY Right 2015   UPPER EXTREMITY ANGIOGRAPHY Left 11/23/2021   Procedure: UPPER EXTREMITY ANGIOGRAPHY;  Surgeon: Annice Needy, MD;  Location: [facility] INVASIVE CV LAB;  Service: Cardiovascular;  Laterality: Left;   Family History  Problem Relation Age of Onset   Stroke Mother    Hypertension Mother    Diabetes Mother    Hypertension Father    Heart disease Sister        MI   Multiple sclerosis Daughter    Multiple sclerosis Son    Cerebral aneurysm Son    Seizures Son    Cerebral aneurysm Son    Breast cancer Paternal Aunt 57   Social History   Socioeconomic History   Marital status: Divorced    Spouse name: Not on file   Number of children: 3   Years of education: Not on file   Highest education level: Not on file  Occupational History   Occupation: retired  Tobacco Use   Smoking status: Former    Current packs/day: 0.00    Types: Cigarettes    Quit date: 10/25/1998    Years since  quitting: 24.5   Smokeless tobacco: Never  Vaping Use   Vaping status: Never Used  Substance and Sexual Activity   Alcohol use: No    Alcohol/week: 0.0 standard drinks of alcohol   Drug use: Never   Sexual activity: Not Currently  Other Topics Concern   Not on file  Social History Narrative   Daughter Zella Ball ([place]); 1 in [employer]; 1 in [place]   Lives alone   Social Determinants of Health   Financial Resource Strain: Low Risk  (05/10/2023)   Overall Financial Resource Strain (CARDIA)    Difficulty of  Paying Living Expenses: Not hard at all  Food Insecurity: No Food Insecurity (05/10/2023)   Hunger Vital Sign    Worried About Running Out of Food in the Last Year: Never true    Ran Out of Food in the Last Year: Never true  Transportation Needs: No Transportation Needs (05/10/2023)   PRAPARE - Administrator, Civil Service (Medical): No    Lack of Transportation (Non-Medical): No  Physical Activity: Inactive (05/10/2023)   Exercise Vital Sign    Days of Exercise per Week: 0 days    Minutes of Exercise per Session: 0 min  Stress: No Stress Concern Present (05/10/2023)   [employer] of Occupational Health - Occupational Stress Questionnaire    Feeling of Stress : Not at all  Social Connections: Moderately Integrated (05/10/2023)   Social Connection and Isolation Panel [NHANES]    Frequency of Communication with Friends and Family: More than three times a week    Frequency of Social Gatherings with Friends and Family: More than three times a week    Attends Religious Services: More than 4 times per year    Active Member of [employer] or Organizations: Yes    Attends Engineer, structural: More than 4 times per year    Marital Status: Divorced    Tobacco Counseling Counseling given: Not Answered   Clinical Intake:  Pre-visit preparation completed: Yes  Pain : 0-10 Pain Score: 10-Worst pain ever Pain Type: Chronic pain Pain Location: Hip (both hips  and back) Pain Orientation: Other (Comment) (hips, legs and back) Pain Descriptors / Indicators: Constant Pain Onset: More than a month ago Pain Relieving Factors: pain medication  Pain Relieving Factors: pain medication  BMI - recorded: 32.12 Nutritional Status: BMI > 30  Obese Nutritional Risks: None Diabetes: No  How often do you need to have someone help you when you read instructions, pamphlets, or other written materials from your doctor or pharmacy?: 1 - Never  Interpreter Needed?: No  Information entered by :: R. Wende Longstreth LPN   Activities of Daily Living    05/10/2023    2:36 PM  In your present state of health, do you have any difficulty performing the following activities:  Hearing? 0  Vision? 0  Comment readers  Difficulty concentrating or making decisions? 0  Walking or climbing stairs? 1  Dressing or bathing? 0  Doing errands, shopping? 0  Preparing Food and eating ? N  Using the Toilet? N  In the past six months, have you accidently leaked urine? Y  Comment uses a pad  Do you have problems with loss of bowel control? N  Managing your Medications? N  Managing your Finances? N  Housekeeping or managing your Housekeeping? N    Patient Care Team: Sherlene Shams, MD as PCP - General (Internal Medicine)  Indicate any recent Medical Services you may have received from other than [facility] providers in the past year (date may be approximate).     Assessment:   This is a routine wellness examination for Brittani.  Hearing/Vision screen Hearing Screening - Comments:: No issues Vision Screening - Comments:: glasses  Dietary issues and exercise activities discussed:     Goals Addressed             This Visit's Progress    Patient Stated       None       Depression Screen    05/10/2023    2:42 PM 04/26/2023   10:44 AM 04/13/2023  2:30 PM 03/25/2023   11:58 AM 02/14/2023    9:25 AM 11/12/2022    3:57 PM 11/03/2022   11:51 AM  PHQ 2/9 Scores  PHQ - 2  Score 0 2 0 0 0 1 0  PHQ- 9 Score 2 5 2 6  0 6 6    Fall Risk    05/10/2023    2:45 PM 04/26/2023   10:44 AM 04/13/2023    2:30 PM 03/25/2023   11:58 AM 02/14/2023    9:25 AM  Fall Risk   Falls in the past year? 0 0 0 0 0  Number falls in past yr: 0 0 0 0 0  Injury with Fall? 0 0 0 0 0  Risk for fall due to : No Fall Risks;Medication side effect No Fall Risks No Fall Risks No Fall Risks No Fall Risks  Follow up Falls prevention discussed;Falls evaluation completed;Education provided Falls evaluation completed Falls evaluation completed Falls evaluation completed Falls evaluation completed    MEDICARE RISK AT HOME:  Medicare Risk at Home - 05/10/23 1445     Any stairs in or around the home? No    If so, are there any without handrails? No    Home free of loose throw rugs in walkways, pet beds, electrical cords, etc? Yes    Adequate lighting in your home to reduce risk of falls? Yes    Life alert? Yes    Use of a cane, walker or w/c? No    Grab bars in the bathroom? No    Shower chair or bench in shower? Yes    Elevated toilet seat or a handicapped toilet? No             T    Cognitive Function:    09/22/2017    3:38 PM  MMSE - Mini Mental State Exam  Orientation to time 5  Orientation to Place 5  Registration 3  Attention/ Calculation 5  Recall 3  Language- name 2 objects 2  Language- repeat 1  Language- follow 3 step command 3  Language- read & follow direction 1  Write a sentence 1  Copy design 1  Total score 30        05/10/2023    2:48 PM 09/22/2016    3:43 PM  6CIT Screen  What Year? 0 points 0 points  What month? 0 points 0 points  What time? 0 points 0 points  Count back from 20 0 points 0 points  Months in reverse 2 points 0 points  Repeat phrase 4 points 0 points  Total Score 6 points 0 points    Immunizations Immunization History  Administered Date(s) Administered   Fluad Quad(high Dose 65+) 07/31/2019, 07/24/2020, 07/06/2021, 08/05/2022    Influenza, High Dose Seasonal PF 08/14/2018   Influenza,inj,Quad PF,6+ Mos 06/24/2014, 08/26/2016, 09/22/2017   Moderna Sars-Covid-2 Vaccination 09/24/2020   PFIZER(Purple Top)SARS-COV-2 Vaccination 12/22/2019, 01/12/2020   Pneumococcal Conjugate-13 12/27/2013   Tdap 01/10/2014    TDAP status: Up to date  Flu Vaccine status: Up to date  Pneumococcal vaccine status: Due, Education has been provided regarding the importance of this vaccine. Advised may receive this vaccine at local pharmacy or Health Dept. Aware to provide a copy of the vaccination record if obtained from local pharmacy or Health Dept. Verbalized acceptance and understanding.  Covid-19 vaccine status: Completed vaccines  Qualifies for Shingles Vaccine? Yes   Zostavax completed No   Shingrix Completed?: No.    Education has been provided regarding  the importance of this vaccine. Patient has been advised to call insurance company to determine out of pocket expense if they have not yet received this vaccine. Advised may also receive vaccine at local pharmacy or Health Dept. Verbalized acceptance and understanding.  Screening Tests Health Maintenance  Topic Date Due   Zoster Vaccines- Shingrix (1 of 2) Never done   DEXA SCAN  Never done   COVID-19 Vaccine (4 - 2023-24 season) 06/25/2022   MAMMOGRAM  02/19/2023   Pneumonia Vaccine 6+ Years old (2 of 2 - PPSV23 or PCV20) 09/24/2023 (Originally 02/21/2014)   Medicare Annual Wellness (AWV)  06/21/2024 (Originally 09/22/2018)   INFLUENZA VACCINE  05/26/2023   DTaP/Tdap/Td (2 - Td or Tdap) 01/11/2024   HPV VACCINES  Aged Out    Health Maintenance  Health Maintenance Due  Topic Date Due   Zoster Vaccines- Shingrix (1 of 2) Never done   DEXA SCAN  Never done   COVID-19 Vaccine (4 - 2023-24 season) 06/25/2022   MAMMOGRAM  02/19/2023    Colorectal cancer screening: No longer required.   Mammogram status: Ordered 04/26/23. Pt provided with contact info and advised to call  to schedule appt.   Bone Denisty scan Patient declines  Lung Cancer Screening: (Low Dose CT Chest recommended if Age 34-80 years, 20 pack-year currently smoking OR have quit w/in 15years.) does not qualify.     Additional Screening:  Hepatitis C Screening: does not qualify; Completed NA age  Vision Screening: Recommended annual ophthalmology exams for early detection of glaucoma and other disorders of the eye. Is the patient up to date with their annual eye exam?  Yes  Who is the provider or what is the name of the office in which the patient attends annual eye exams? [facility] If pt is not established with a provider, would they like to be referred to a provider to establish care? No .   Dental Screening: Recommended annual dental exams for proper oral hygiene    Community Resource Referral / Chronic Care Management: CRR required this visit?  No   CCM required this visit?  No     Plan:     I have personally reviewed and noted the following in the patient's chart:   Medical and social history Use of alcohol, tobacco or illicit drugs  Current medications and supplements including opioid prescriptions. Patient is currently taking opioid prescriptions. Information provided to patient regarding non-opioid alternatives. Patient advised to discuss non-opioid treatment plan with their provider. Functional ability and status Nutritional status Physical activity Advanced directives List of other physicians Hospitalizations, surgeries, and ER visits in previous 12 months Vitals Screenings to include cognitive, depression, and falls Referrals and appointments  In addition, I have reviewed and discussed with patient certain preventive protocols, quality metrics, and best practice recommendations. A written personalized care plan for preventive services as well as general preventive health recommendations were provided to patient.     Sydell Axon, LPN   1/61/0960   After  Visit Summary: (MyChart) Due to this being a telephonic visit, the after visit summary with patients personalized plan was offered to patient via MyChart   Nurse Notes: None    I have reviewed the above information and agree with above.   Duncan Dull, MD

## 2023-05-11 ENCOUNTER — Encounter: Payer: Self-pay | Admitting: Internal Medicine

## 2023-05-12 ENCOUNTER — Other Ambulatory Visit: Payer: Self-pay

## 2023-05-12 DIAGNOSIS — I10 Essential (primary) hypertension: Secondary | ICD-10-CM

## 2023-05-12 DIAGNOSIS — K219 Gastro-esophageal reflux disease without esophagitis: Secondary | ICD-10-CM

## 2023-05-12 DIAGNOSIS — I130 Hypertensive heart and chronic kidney disease with heart failure and stage 1 through stage 4 chronic kidney disease, or unspecified chronic kidney disease: Secondary | ICD-10-CM

## 2023-05-12 MED ORDER — CLOPIDOGREL BISULFATE 75 MG PO TABS
75.0000 mg | ORAL_TABLET | Freq: Every day | ORAL | 1 refills | Status: DC
Start: 2023-05-12 — End: 2023-10-05

## 2023-05-28 ENCOUNTER — Encounter: Payer: Self-pay | Admitting: Internal Medicine

## 2023-05-31 DIAGNOSIS — R200 Anesthesia of skin: Secondary | ICD-10-CM | POA: Diagnosis not present

## 2023-05-31 DIAGNOSIS — R202 Paresthesia of skin: Secondary | ICD-10-CM | POA: Diagnosis not present

## 2023-05-31 MED ORDER — LOSARTAN POTASSIUM 50 MG PO TABS: 50.0000 mg | ORAL_TABLET | Freq: Every day | ORAL | 1 refills | Status: DC

## 2023-06-15 DIAGNOSIS — M5442 Lumbago with sciatica, left side: Secondary | ICD-10-CM | POA: Diagnosis not present

## 2023-06-15 DIAGNOSIS — G8929 Other chronic pain: Secondary | ICD-10-CM | POA: Diagnosis not present

## 2023-06-15 DIAGNOSIS — G629 Polyneuropathy, unspecified: Secondary | ICD-10-CM | POA: Diagnosis not present

## 2023-06-15 DIAGNOSIS — M5441 Lumbago with sciatica, right side: Secondary | ICD-10-CM | POA: Diagnosis not present

## 2023-07-07 ENCOUNTER — Telehealth: Payer: Self-pay | Admitting: Internal Medicine

## 2023-07-07 MED ORDER — HYDROCODONE-ACETAMINOPHEN 10-325 MG PO TABS: 1.0000 | ORAL_TABLET | Freq: Every day | ORAL | 0 refills | Status: AC | PRN

## 2023-07-07 NOTE — Telephone Encounter (Signed)
Per chart Hydralazine was discontinued at hospital discharge in 2023.   Hydrocodone refilled: 09/01/2022 Last OV: 04/26/2023 Next OV: not scheduled

## 2023-07-07 NOTE — Telephone Encounter (Signed)
Hydrocodone refilled  hydralazine not refilled until she can clarfiy why she is asking for it when it wa sdiscontinued

## 2023-07-07 NOTE — Telephone Encounter (Signed)
Patient just called and needs two refills one is HYDROcodone-acetaminophen (NORCO) 10-325 MG per tablet 1 tablet and the other is hydrolozen. She said she has gotten both these prescriptions before. The 2nd one I didn't see on her medication chart. The pharmacy she uses is  [facility] [address]), [place] - [address]  Phone: [phone] Fax: [phone]   Her number is [phone].

## 2023-07-11 MED ORDER — HYDRALAZINE HCL 50 MG PO TABS: ORAL_TABLET | ORAL | 0 refills | Status: DC

## 2023-07-11 NOTE — Telephone Encounter (Signed)
Pt stated that she takes the hydralazine only when her BP gets over 150. Pt stated that she has not had to take it in a long time.

## 2023-07-21 ENCOUNTER — Ambulatory Visit
Payer: Medicare Other | Attending: Student in an Organized Health Care Education/Training Program | Admitting: Student in an Organized Health Care Education/Training Program

## 2023-07-21 ENCOUNTER — Encounter: Payer: Self-pay | Admitting: Student in an Organized Health Care Education/Training Program

## 2023-07-21 VITALS — BP 155/66 | HR 72 | Temp 97.5°F | Resp 14 | Ht 60.0 in | Wt 169.0 lb

## 2023-07-21 DIAGNOSIS — Z96643 Presence of artificial hip joint, bilateral: Secondary | ICD-10-CM | POA: Insufficient documentation

## 2023-07-21 DIAGNOSIS — M47816 Spondylosis without myelopathy or radiculopathy, lumbar region: Secondary | ICD-10-CM | POA: Insufficient documentation

## 2023-07-21 DIAGNOSIS — M5416 Radiculopathy, lumbar region: Secondary | ICD-10-CM | POA: Insufficient documentation

## 2023-07-21 DIAGNOSIS — M79652 Pain in left thigh: Secondary | ICD-10-CM | POA: Insufficient documentation

## 2023-07-21 DIAGNOSIS — I739 Peripheral vascular disease, unspecified: Secondary | ICD-10-CM | POA: Insufficient documentation

## 2023-07-21 DIAGNOSIS — G894 Chronic pain syndrome: Secondary | ICD-10-CM | POA: Insufficient documentation

## 2023-07-21 DIAGNOSIS — G8929 Other chronic pain: Secondary | ICD-10-CM | POA: Diagnosis not present

## 2023-07-21 DIAGNOSIS — Z95828 Presence of other vascular implants and grafts: Secondary | ICD-10-CM | POA: Insufficient documentation

## 2023-07-21 MED ORDER — TIZANIDINE HCL 4 MG PO TABS
2.0000 mg | ORAL_TABLET | Freq: Every day | ORAL | 0 refills | Status: DC | PRN
Start: 2023-07-21 — End: 2023-09-13

## 2023-07-21 MED ORDER — GABAPENTIN 300 MG PO CAPS
ORAL_CAPSULE | ORAL | 0 refills | Status: DC
Start: 2023-07-21 — End: 2023-09-13

## 2023-07-21 NOTE — Patient Instructions (Signed)

## 2023-07-21 NOTE — Progress Notes (Signed)
Patient: Sabrina [name]  Service Category: E/M  Provider: Edward Jolly, MD  DOB: 1941/01/09  DOS: 07/21/2023  Referring Provider: Morene Crocker, MD  MRN: [id number]  Setting: Ambulatory outpatient  PCP: Sherlene Shams, MD  Type: New Patient  Specialty: Interventional Pain Management    Location: Office  Delivery: Face-to-face     Primary Reason(s) for Visit: Encounter for initial evaluation of one or more chronic problems (new to examiner) potentially causing chronic pain, and posing a threat to normal musculoskeletal function. (Level of risk: High) CC: Back Pain (lower) and Hip Pain (bilateral)  HPI  Sabrina [name] is a 82 y.o. year old, female patient, who comes for the first time to our practice referred by Morene Crocker, MD for our initial evaluation of her chronic pain. She has Hip pain, bilateral; Essential hypertension; Need for vaccination with 13-polyvalent pneumococcal conjugate vaccine; HLD (hyperlipidemia); Need for prophylactic vaccination and inoculation against influenza; Peripheral vascular disease (HCC); CAD (coronary artery disease); Generalized anxiety disorder; Chronic epigastric pain; Chronic right hip pain; GERD (gastroesophageal reflux disease); Statin intolerance; Chronic daily headache; Cervical spondylosis without myelopathy; Dizziness; Palpitations; Atherosclerosis of renal artery (HCC); Carotid stenosis; Aortic aneurysm, abdominal (HCC); Aortic valve disorder; Chronic radicular lumbar pain; Lumbar stenosis with neurogenic claudication; Osteoarthritis of right hip; 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HCC); Anemia of chronic kidney failure, stage 3 (moderate) (HCC); Educated about COVID-19 virus infection; Ankle swelling; Pain due to onychomycosis of toenails of both feet; Callus; Chronic pain syndrome; Lumbar facet arthropathy; Encounter for preventive health examination; Fatigue due to depression; Acquired hypothyroidism; CKD (chronic kidney disease), stage III (HCC); Obesity  (BMI 30.0-34.9); Fatigue; Insomnia due to anxiety and fear; Lichenified rash; 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 Today she comes in for evaluation of her Back Pain (lower) and Hip Pain (bilateral)  Pain Assessment: Location: Lower   Radiating: both groin, both upper legs Onset: 1 to 4 weeks ago Duration: Chronic pain Quality: Dull, Throbbing Severity: 10-Worst pain ever/10 (subjective, self-reported pain score)  Effect on ADL: difficulty performing daily activities Timing: Constant Modifying factors: nothing BP: (!) 155/66  HR: 72  Onset and Duration: Gradual Cause of pain: Unknown Severity: NAS-11 at its worse: 10/10 and NAS-11 on the average: 10/10 Timing: Morning, Afternoon, Night, Not influenced by the time of the day, During activity or exercise, After activity or exercise, and After a period of immobility Aggravating Factors: Bending, Climbing, Kneeling, Lifiting, Prolonged sitting, Prolonged standing, Squatting, Stooping , Surgery made it worse, Walking, Walking uphill, and Walking downhill Alleviating Factors: Lying down Associated Problems: Constipation, Dizziness, Fatigue, Inability to control bladder (urine), and Swelling Quality of Pain: Deep, Dull, and Feeling of weight   Sabrina [name] is being evaluated for possible interventional pain management therapies for the treatment of her chronic pain.  Patient is a pleasant  82 year old female who presents with a chief complaint of low back pain as well as left anterior thigh pain and muscle spasms.  She states that the left anterior thigh pain has gotten worse.  She does have a history of peripheral  arterial disease and iliac stents in place.  She feels that her left thigh pain may be related to a complication after her iliac stent procedure.  Of note, the patient has see me in the past, over 3 years ago for similar low back pain.  This was related to lumbar facet arthropathy and spondylosis.  She went on to have 2 diagnostic lumbar facet medial branch nerve blocks which unfortunately were not helpful.  Since then, she has been seeing neurology.  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  She is here to discuss options for chronic pain management. Meds   Current Outpatient Medications:    amLODipine (NORVASC) 10 MG tablet, Take 1 tablet (10 mg total) by mouth at bedtime., Disp: 90 tablet, Rfl: 1   aspirin 81 MG tablet, Take 81 mg by mouth in the morning., Disp: , Rfl:    clopidogrel (PLAVIX) 75 MG tablet, Take 1 tablet (75 mg total) by mouth at bedtime. To prevent strokes, Disp: 90 tablet, Rfl: 1   ezetimibe (ZETIA) 10 MG tablet, Take 1 tablet (10 mg total) by mouth at bedtime., Disp: 90 tablet, Rfl: 3   gabapentin (NEURONTIN) 300 MG capsule, Take 1 capsule (300 mg total) by mouth at bedtime for 15 days, THEN 1 capsule (300 mg total) 2 (two) times daily for 15 days, THEN 1 capsule (300 mg total) 3 (three) times daily., Disp: 135 capsule, Rfl: 0   hydrALAZINE (APRESOLINE) 50 MG tablet, TAKE 1 TABLET BY MOUTH THREE TIMES DAILY AS NEEDED FOR BLOOD PRESSURE GREATER THAN 150, Disp: 270 tablet, Rfl: 0   HYDROcodone-acetaminophen (NORCO) 10-325 MG tablet, Take 1 tablet by mouth daily as needed for moderate pain., Disp: 30 tablet, Rfl: 0   labetalol (NORMODYNE) 300 MG tablet, Take 1 tablet (300 mg total) by  mouth 2 (two) times daily. For hypertension, Disp: 180 tablet, Rfl: 1   losartan (COZAAR) 50 MG tablet, Take 1 tablet (50 mg total) by mouth at bedtime., Disp: 90 tablet, Rfl: 1   nortriptyline (PAMELOR) 10 MG capsule, Take 1 capsule (10 mg total) by mouth 2 (two) times daily. For chronic headaches, Disp: 180 capsule, Rfl: 3   pantoprazole (PROTONIX) 40 MG tablet, One tablet upon waking,  take 30 minuts prior to eating, Disp: 90 tablet, Rfl: 3   ranolazine (RANEXA) 500 MG 12 hr tablet, Take 1 tablet (500 mg total) by mouth 2 (two) times daily., Disp: 60 tablet, Rfl: 3   tiZANidine (ZANAFLEX) 4 MG tablet, Take 0.5-1 tablets (2-4 mg total) by mouth daily as needed for muscle spasms., Disp: 90 tablet, Rfl: 0   triamcinolone cream (KENALOG) 0.1 %, Apply 1 Application topically 2 (two) times daily. To all areas of rash, Disp: 453.6 g, Rfl: 0   DULoxetine (CYMBALTA) 30 MG capsule, Take 1 capsule (30 mg total) by mouth daily., Disp: 90 capsule, Rfl: 1  Imaging Review  Cervical Imaging: Cervical MR wo contrast: Results for orders placed during the hospital encounter of 07/14/18  MR Cervical Spine Wo Contrast  Narrative CLINICAL DATA:  Degenerative disc disease and radiculopathy. Spinal osteoarthritis  EXAM: MRI CERVICAL SPINE WITHOUT CONTRAST  TECHNIQUE: Multiplanar, multisequence MR imaging of the cervical spine was performed. No intravenous contrast was administered.  COMPARISON:  08/11/2015  FINDINGS: Alignment: Physiologic.  Vertebrae: No fracture, evidence of discitis, or bone lesion.  Cord: Normal signal  Posterior Fossa, vertebral arteries, paraspinal tissues: 10 mm right posterior thyroid nodule that  was also seen in 2016.  Disc levels:  C2-3: Mild disc bulging.  No impingement  C3-4: Disc narrowing and bulging with a central protrusion flattening the cord against the mildly thickened ligamentum flavum. Mild uncovertebral spurring. The foramina are patent  C4-5: Disc  narrowing and bulging with uncovertebral spurring greater on the left. There is central protrusion flattening the cord against the ligamentum flavum. Advanced left foraminal stenosis.  C5-6: Disc narrowing and bulging with endplate and uncovertebral ridging. Disc protrusion flattens the cord. There is biforaminal impingement  C6-7: Disc narrowing and bulging with left uncovertebral spurring. Mild left foraminal stenosis.  C7-T1:Unremarkable.  IMPRESSION: 1. No significant change when compared to 2016. 2. There is spinal stenosis with cord flattening from disc protrusion at C3-4 to C5-6. 3. C4-5 left and C5-6 bilateral foraminal impingement.   Electronically Signed By: Marnee Spring M.D. On: 07/14/2018 15:00   Narrative CLINICAL DATA:  82 year old female with constant headaches and neck pain. No reported history of trauma. Prior carotid surgery. Initial encounter.  EXAM: CERVICAL SPINE  4+ VIEWS  COMPARISON:  None.  FINDINGS: Normal alignment cervical spine.  Minimal C4-5 and mild C5-6 disc space narrowing with osteophyte formation.  C4-5 and C5-6 foraminal narrowing greatest on the right at the C5-6 level.  Lung apices are clear.  Vascular calcifications included prominent right carotid bifurcation calcifications. Prior left carotid endarterectomy.  IMPRESSION: Minimal C4-5 and mild C5-6 disc space narrowing with osteophyte.  C4-5 and C5-6 foraminal narrowing greatest on the right at the C5-6 level   Electronically Signed By: Lacy Duverney M.D. On: 07/22/2015 14:44   MR LUMBAR SPINE WO CONTRAST  Narrative CLINICAL DATA:  Chronic low back pain with left-sided sciatica, unspecified back pain laterality. Additional history provided: Pain across lower back left greater than right since 2000, left greater than right hip and buttock pain.  EXAM: MRI LUMBAR SPINE WITHOUT CONTRAST  TECHNIQUE: Multiplanar, multisequence MR imaging of the lumbar spine  was performed. No intravenous contrast was administered.  COMPARISON:  Lumbar spine MRI 05/05/2016  FINDINGS: Segmentation: 5 lumbar vertebrae.  Alignment: Lumbar dextrocurvature. Trace L2-L3 retrolisthesis. 5 mm L4-L5 grade 1 anterolisthesis, unchanged.  Vertebrae: Vertebral body height is maintained. No suspicious osseous lesions or marrow edema.  Conus medullaris and cauda equina: Conus extends to the L1-L2 level. No signal abnormality within the visualized distal spinal cord.  Paraspinal and other soft tissues: Tiny T2 hyperintense lesions within the bilateral kidneys likely reflects cysts. Included portions of the abdomen otherwise unremarkable. Atrophy of the lumbar paraspinal musculature.  Disc levels:  Mild multilevel disc degeneration.  Unless otherwise stated, the level by level findings below have not significantly changed since prior MRI 05/05/2016.  T12-L1: Mild facet arthrosis. No significant canal or foraminal stenosis.  L1-L2: Mild facet arthrosis. No significant canal or foraminal stenosis.  L2-L3: Trace retrolisthesis. Small disc bulge. Mild facet arthrosis. No significant spinal canal or neural foraminal narrowing.  L3-L4: Disc bulge asymmetric to the left. Facet arthrosis/ligamentum flavum hypertrophy. Mild bilateral subarticular narrowing (greater on the left) with possible contact upon the descending left L4 nerve root. No significant central canal stenosis. Mild left neural foraminal narrowing. Left subarticular narrowing may be slightly progressed with findings otherwise unchanged.  L4-L5: Grade 1 anterolisthesis with disc uncovering. Prominent facet arthrosis/ligamentum flavum hypertrophy (greater on the left). Bilateral subarticular narrowing (greater on the left) with likely impingement upon the descending left L5 nerve root. Mild central canal stenosis. Mild bilateral neural foraminal narrowing.  L5-S1: Small disc  bulge with endplate  spurring. Mild facet arthrosis. Mild bilateral subarticular narrowing with possible contact upon the descending S1 nerve roots. No significant central canal stenosis. Mild bilateral neural foraminal narrowing  IMPRESSION: Lumbar spondylosis as detailed. Multifactorial mild L3-L4 left subarticular narrowing with possible contact upon the descending left L4 nerve root may be slightly progressed. Findings otherwise similar to prior MRI 05/05/2016.  At L4-L5, degenerative grade 1 anterolisthesis with prominent facet arthrosis/ligamentum flavum hypertrophy. Bilateral subarticular narrowing (greater on the left) with probable impingement of the descending left L5 nerve root. Mild relative central canal stenosis and bilateral neural foraminal narrowing.  No more than mild spinal canal stenosis at the remaining levels. Additional sites of mild neural foraminal narrowing as described.   Electronically Signed By: Jackey Loge On: 07/20/2019 10:22   Narrative CLINICAL DATA:  Low back and hip pain. Chronic radicular lumbar pain.  EXAM: LUMBAR SPINE - COMPLETE 4+ VIEW  COMPARISON:  Lumbar MRI 07/19/2019  FINDINGS: There are 5 non-rib-bearing lumbar vertebra. The bones are subjectively under mineralized. Minor broad-based rightward curvature. Trace retrolisthesis of L2 on L3. Grade 1 anterolisthesis of 5 mm of L4 on L5. Mild diffuse degenerative disc disease with disc space narrowing and spurring throughout. There is moderate facet hypertrophy from L3-L4 through L5-S1. vertebral body heights are normal. There is no evidence of compression deformity or fracture. No focal bone abnormality.  IMPRESSION: 1. Mild diffuse degenerative disc disease. Moderate facet hypertrophy in the lower lumbar spine. 2. Trace retrolisthesis of L2 on L3 and grade 1 anterolisthesis of L4 on L5, unchanged from prior MRI.   Electronically Signed By: Narda Rutherford M.D. On: 03/30/2023  16:40  Complexity Note: Imaging results reviewed.                         ROS  Cardiovascular: Heart trouble, Daily Aspirin intake, High blood pressure, Heart surgery, Heart failure, Heart murmur, Heart catheterization, and Blood thinners:  Antiplatelet and Anticoagulant Pulmonary or Respiratory: No reported pulmonary signs or symptoms such as wheezing and difficulty taking a deep full breath (Asthma), difficulty blowing air out (Emphysema), coughing up mucus (Bronchitis), persistent dry cough, or temporary stoppage of breathing during sleep Neurological: Incontinence:  Urinary Psychological-Psychiatric: No reported psychological or psychiatric signs or symptoms such as difficulty sleeping, anxiety, depression, delusions or hallucinations (schizophrenial), mood swings (bipolar disorders) or suicidal ideations or attempts Gastrointestinal: Irregular, infrequent bowel movements (Constipation) Genitourinary: Kidney disease Hematological: No reported hematological signs or symptoms such as prolonged bleeding, low or poor functioning platelets, bruising or bleeding easily, hereditary bleeding problems, low energy levels due to low hemoglobin or being anemic Endocrine: No reported endocrine signs or symptoms such as high or low blood sugar, rapid heart rate due to high thyroid levels, obesity or weight gain due to slow thyroid or thyroid disease Rheumatologic: No reported rheumatological signs and symptoms such as fatigue, joint pain, tenderness, swelling, redness, heat, stiffness, decreased range of motion, with or without associated rash Musculoskeletal: Negative for myasthenia gravis, muscular dystrophy, multiple sclerosis or malignant hyperthermia Work History: Retired  Allergies  Sabrina [name] is allergic to celexa [citalopram], dilaudid [hydromorphone hcl], effient [prasugrel], hydrochlorothiazide, liothyronine, nsaids, nubain [nalbuphine hcl], penicillins, and statins.  Laboratory Chemistry  Profile   Renal Lab Results  Component Value Date   BUN 23 05/04/2023   CREATININE 1.70 (H) 05/04/2023   LABCREA 86 03/06/2018   GFR 28.01 (L) 04/26/2023   GFRAA 50 (L) 03/10/2020   GFRNONAA 36 (L)  04/14/2022   PROTEINUR 30 (A) 06/13/2021     Electrolytes Lab Results  Component Value Date   NA 137 05/04/2023   K 4.2 05/04/2023   CL 104 05/04/2023   CALCIUM 9.7 04/26/2023   MG 2.5 (H) 03/31/2022   PHOS 3.8 09/23/2022     Hepatic Lab Results  Component Value Date   AST 14 04/26/2023   ALT 10 04/26/2023   ALBUMIN 3.8 04/26/2023   ALKPHOS 69 04/26/2023   LIPASE 16.0 07/06/2017     ID Lab Results  Component Value Date   SARSCOV2NAA POSITIVE (A) 09/22/2021   STAPHAUREUS POSITIVE (A) 03/30/2022   MRSAPCR NEGATIVE 03/30/2022     Bone No results found for: "VD25OH", "VD125OH2TOT", "XL2440NU2", "VO5366YQ0", "25OHVITD1", "25OHVITD2", "25OHVITD3", "TESTOFREE", "TESTOSTERONE"   Endocrine Lab Results  Component Value Date   GLUCOSE 95 05/04/2023   GLUCOSEU NEGATIVE 06/13/2021   HGBA1C 5.9 07/19/2016   TSH 4.68 04/13/2023     Neuropathy Lab Results  Component Value Date   VITAMINB12 405 04/13/2023   FOLATE 14.2 04/13/2023   HGBA1C 5.9 07/19/2016     CNS No results found for: "COLORCSF", "APPEARCSF", "RBCCOUNTCSF", "WBCCSF", "POLYSCSF", "LYMPHSCSF", "EOSCSF", "PROTEINCSF", "GLUCCSF", "JCVIRUS", "CSFOLI", "IGGCSF", "LABACHR", "ACETBL"   Inflammation (CRP: Acute  ESR: Chronic) Lab Results  Component Value Date   CRP <1.0 02/17/2023   ESRSEDRATE 53 (H) 02/17/2023   LATICACIDVEN 1.0 03/30/2022     Rheumatology No results found for: "RF", "ANA", "LABURIC", "URICUR", "LYMEIGGIGMAB", "LYMEABIGMQN", "HLAB27"   Coagulation Lab Results  Component Value Date   INR 1.3 (H) 03/31/2022   LABPROT 15.7 (H) 03/31/2022   APTT 37 (H) 03/31/2022   PLT 321.0 02/17/2023     Cardiovascular Lab Results  Component Value Date   BNP 261.0 (H) 11/10/2021   CKTOTAL 189  03/17/2022   CKMB 1.8 01/30/2012   TROPONINI 0.04 01/30/2012   HGB 10.9 (L) 05/04/2023   HCT 32.0 (L) 05/04/2023     Screening Lab Results  Component Value Date   SARSCOV2NAA POSITIVE (A) 09/22/2021   STAPHAUREUS POSITIVE (A) 03/30/2022   MRSAPCR NEGATIVE 03/30/2022     Cancer No results found for: "CEA", "CA125", "LABCA2"   Allergens No results found for: "ALMOND", "APPLE", "ASPARAGUS", "AVOCADO", "BANANA", "BARLEY", "BASIL", "BAYLEAF", "GREENBEAN", "LIMABEAN", "WHITEBEAN", "BEEFIGE", "REDBEET", "BLUEBERRY", "BROCCOLI", "CABBAGE", "MELON", "CARROT", "CASEIN", "CASHEWNUT", "CAULIFLOWER", "CELERY"     Note: Lab results reviewed.  PFSH  Drug: Sabrina [name]  reports no history of drug use. Alcohol:  reports no history of alcohol use. Tobacco:  reports that she quit smoking about 24 years ago. Her smoking use included cigarettes. She has never used smokeless tobacco. Medical:  has a past medical history of Abdominal aortic ectasia (HCC) (07/13/2017), Amputation of fifth toe, right, traumatic, subsequent encounter (HCC) (06/18/2019), Anemia of chronic kidney failure, Anxiety, Aortic stenosis (03/18/2020), CAD (coronary artery disease), Cardiac arrest [facility]), Carotid artery stenosis, Cataracts, bilateral, Cervical spondylosis without myelopathy, Chronic diastolic CHF (congestive heart failure), NYHA class 3 (HCC), Chronic kidney disease, stage III (moderate) (HCC), Chronic narcotic use (06/24/2014), Chronic pain syndrome, GERD (gastroesophageal reflux disease), History of 2019 novel coronavirus disease (COVID-19) (09/22/2021), Hyperlipidemia, Hypertension, Long term current use of antithrombotics/antiplatelets, Lumbar stenosis with neurogenic claudication, Mitral stenosis (11/16/2021), Osteoarthritis of hip, Postoperative wound infection (03/29/2022), Pulmonary hypertension (HCC) (12/12/2017), PVD (peripheral vascular disease) (HCC), Renal artery stenosis (HCC), 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 Family: family history includes Breast cancer (age of onset: 8)  in her paternal aunt; Cerebral aneurysm in her son and son; Diabetes in her mother; Heart disease in her sister; Hypertension in her father and mother; Multiple sclerosis in her daughter and son; Seizures in her son; Stroke in her mother.  Past Surgical History:  Procedure Laterality Date   ABDOMINAL AORTOGRAM W/LOWER EXTREMITY N/A 10/06/2022   Procedure: ABDOMINAL AORTOGRAM W/LOWER EXTREMITY;  Surgeon: Victorino Sparrow, MD;  Location: [facility] INVASIVE CV LAB;  Service: Cardiovascular;  Laterality: N/A;   ABDOMINAL AORTOGRAM W/LOWER EXTREMITY N/A 05/04/2023   Procedure: ABDOMINAL AORTOGRAM W/LOWER EXTREMITY;  Surgeon: Victorino Sparrow, MD;  Location: [facility] INVASIVE CV LAB;  Service: Cardiovascular;  Laterality: N/A;   ABDOMINAL HYSTERECTOMY  1976   CAROTID ARTERY ANGIOPLASTY Left    CAROTID ENDARTERECTOMY Left 09/09/2003   Procedure: CAROTID ENDARTERECTOMY; Location: [facility]; Surgeon: Magda Bernheim, MD   COLONOSCOPY WITH PROPOFOL N/A 08/16/2017   Procedure: COLONOSCOPY WITH PROPOFOL;  Surgeon: Midge Minium, MD;  Location: [facility] ENDOSCOPY;  Service: Endoscopy;  Laterality: N/A;   CORONARY ANGIOPLASTY WITH STENT PLACEMENT  2000   CORONARY ARTERY BYPASS GRAFT N/A 03/29/2000   Procedure: 3v CORONARY ARTERY BYPASS GRAFT; Location: [facility]   CYSTOSCOPY WITH STENT PLACEMENT Bilateral    ENDARTERECTOMY FEMORAL Bilateral 03/17/2022   Procedure: ENDARTERECTOMY FEMORAL ( BILATERAL SFA STENT);  Surgeon: Renford Dills, MD;  Location: [facility] ORS;  Service: Vascular;  Laterality: Bilateral;   ENDARTERECTOMY FEMORAL Right 03/30/2022   Procedure: RE-EXPOSURE OF RIGHT COMMON FEMORAL ARTERY, RE-DO RIGHT FEMORAL ENDARTERECTOMY WITH VEIN PATCH;  Surgeon: Victorino Sparrow, MD;  Location: [facility] OR;  Service: Vascular;  Laterality: Right;  WOUND VAC   ESOPHAGOGASTRODUODENOSCOPY  (EGD) WITH PROPOFOL N/A 08/16/2017   Procedure: ESOPHAGOGASTRODUODENOSCOPY (EGD) WITH PROPOFOL;  Surgeon: Midge Minium, MD;  Location: [facility] ENDOSCOPY;  Service: Endoscopy;  Laterality: N/A;   ESOPHAGOGASTRODUODENOSCOPY (EGD) WITH PROPOFOL N/A 06/29/2018   Procedure: ESOPHAGOGASTRODUODENOSCOPY (EGD) WITH PROPOFOL;  Surgeon: Pasty Spillers, MD;  Location: [facility] ENDOSCOPY;  Service: Endoscopy;  Laterality: N/A;   GROIN DEBRIDEMENT Right 03/30/2022   Procedure: RIGHT GROIN IRRIGATION & DEBRIDEMENT;  Surgeon: Victorino Sparrow, MD;  Location: [facility] OR;  Service: Vascular;  Laterality: Right;   INSERTION OF ILIAC STENT Left 03/17/2022   Procedure: INSERTION OF ILIAC STENT;  Surgeon: Renford Dills, MD;  Location: [facility] ORS;  Service: Vascular;  Laterality: Left;   LAPAROSCOPIC CHOLECYSTECTOMY Left 10/26/1999   Procedure: LAPAROSCOPIC CHOLECYSTECTOMY; Location: [facility]; Surgeon: Renda Rolls, MD   LEFT HEART CATH AND CORONARY ANGIOGRAPHY N/A 11/18/2021   Procedure: LEFT HEART CATH AND CORONARY ANGIOGRAPHY;  Surgeon: Alwyn Pea, MD;  Location: [facility] INVASIVE CV LAB;  Service: Cardiovascular;  Laterality: N/A;   LEFT HEART CATH AND CORS/GRAFTS ANGIOGRAPHY Left 11/19/2002   Procedure: LEFT HEART CATH AND CORS/GRAFTS ANGIOGRAPHY; Location: [facility]; Surgeon: Rudean Hitt, MD   LEFT HEART CATH AND CORS/GRAFTS ANGIOGRAPHY Left 09/17/2003   Procedure: LEFT HEART CATH AND CORS/GRAFTS ANGIOGRAPHY; Location: [facility]; Surgeon: Rudean Hitt, MD   LOWER EXTREMITY ANGIOGRAPHY Right 01/06/2022   Procedure: Lower Extremity Angiography;  Surgeon: Renford Dills, MD;  Location[facility]VASIVE CV LAB;  Service: Cardiovascular;  Laterality: Right;   PERIPHERAL VASCULAR BALLOON ANGIOPLASTY  10/06/2022   Procedure: PERIPHERAL VASCULAR BALLOON ANGIOPLASTY;  Surgeon: Victorino Sparrow, MD;  Location: [facility] INVASIVE CV LAB;  Service: Cardiovascular;;  left iliac and bilateral renal   RENAL ARTERY ANGIOPLASTY Bilateral  12/2013   TOE AMPUTATION Right    small toe   TONSILLECTOMY AND ADENOIDECTOMY  TOTAL HIP ARTHROPLASTY Left 2005   TOTAL HIP ARTHROPLASTY Right 2015   UPPER EXTREMITY ANGIOGRAPHY Left 11/23/2021   Procedure: UPPER EXTREMITY ANGIOGRAPHY;  Surgeon: Annice Needy, MD;  Location: [facility] INVASIVE CV LAB;  Service: Cardiovascular;  Laterality: Left;   Active Ambulatory Problems    Diagnosis Date Noted   Hip pain, bilateral 12/27/2013   Essential hypertension 12/27/2013   Need for vaccination with 13-polyvalent pneumococcal conjugate vaccine 12/28/2013   HLD (hyperlipidemia) 06/24/2014   Need for prophylactic vaccination and inoculation against influenza 06/24/2014   Peripheral vascular disease (HCC) 08/03/2014   CAD (coronary artery disease) 08/03/2014   Generalized anxiety disorder 09/08/2014   Chronic epigastric pain 11/14/2014   Chronic right hip pain 11/14/2014   GERD (gastroesophageal reflux disease) 12/06/2014   Statin intolerance 05/20/2015   Chronic daily headache 07/20/2015   Cervical spondylosis without myelopathy 08/20/2015   Dizziness 12/04/2015   Palpitations 06/01/2016   Atherosclerosis of renal artery (HCC) 09/09/2016   Carotid stenosis 09/09/2016   Aortic aneurysm, abdominal (HCC) 07/05/2017   Aortic valve disorder 12/28/2013   Chronic radicular lumbar pain 12/24/2014   Lumbar stenosis with neurogenic claudication 12/24/2014   Osteoarthritis of right hip 04/04/2014   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HCC) 08/04/2017   Anemia of chronic kidney failure, stage 3 (moderate) (HCC) 02/22/2018   Educated about COVID-19 virus infection 03/21/2019   Ankle swelling 04/22/2019   Pain due to onychomycosis of toenails of both feet 06/18/2019   Callus 06/18/2019   Chronic pain syndrome 03/27/2020   Lumbar facet arthropathy 03/27/2020   Encounter for preventive health examination 05/06/2020   Fatigue due to depression 07/26/2020   Acquired hypothyroidism 07/30/2020   CKD (chronic  kidney disease), stage III (HCC) 09/09/2020   Obesity (BMI 30.0-34.9) 11/25/2020   Fatigue 11/25/2020   Insomnia due to anxiety and fear 01/29/2021   Lichenified rash 04/27/2021   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, stage III (HCC) 09/07/2021   COVID-19 virus infection 10/04/2021   Subclavian artery stenosis, left (HCC) 11/20/2021   Atherosclerosis of artery of extremity with rest pain (HCC) 03/17/2022   Other specified hypothyroidism    Anxiety and depression    PAD (peripheral artery disease) (HCC)    Anemia in chronic kidney disease 03/24/2022   Debility 03/24/2022   Cough 08/01/2022   Rash in adult 11/12/2022   Rotator cuff impingement syndrome of left shoulder 02/14/2023   Paresthesia 02/14/2023   Visual changes 02/18/2023   Cerebrovascular disease 02/24/2023   Left thigh pain 07/21/2023   S/P insertion of iliac artery stent 07/21/2023   Status post bilateral hip replacements 07/21/2023   Resolved Ambulatory Problems    Diagnosis Date Noted   Lumbosacral radiculitis 12/27/2013   Aortic systolic murmur on examination 12/28/2013   Chronic narcotic use 06/24/2014   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, stage III (HCC) 08/03/2014   Lumbago without sciatica 09/06/2014   Dizziness and giddiness 05/20/2015   Viral respiratory illness 11/26/2015   Cervicalgia 12/04/2015   Chronic breast pain 12/14/2016   Dizziness, psychogenic 02/13/2017   Abdominal pain 04/12/2017   Sinus congestion 11/06/2017   Stomach irritation    Sinusitis 12/23/2018   Swelling of right hand 04/22/2019   Amputation of fifth toe, right, traumatic, subsequent encounter (HCC) 06/18/2019   Thumb pain, left 10/09/2019   LAD (lymphadenopathy), cervical 01/28/2022   Cardiac arrest (HCC)    Respiratory arrest (HCC)    Normocytic anemia    NSTEMI (  non-ST elevated myocardial infarction) (HCC) 03/24/2022   Acute respiratory failure  with hypoxia and hypercapnia (HCC) 03/24/2022   Postoperative wound infection 03/29/2022   Acute renal failure superimposed on chronic kidney disease (HCC) 03/29/2022   Nausea, vomiting, and diarrhea 03/29/2022   Acute blood loss anemia 03/30/2022   Myocardial infarction due to demand ischemia (HCC) 03/30/2022   Bronchitis 11/03/2022   Past Medical History:  Diagnosis Date   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   Anemia of chronic kidney failure    Anxiety    Aortic stenosis 03/18/2020   Carotid artery stenosis    Cataracts, bilateral    Chronic diastolic CHF (congestive heart failure), NYHA class 3 (HCC)    Chronic kidney disease, stage III (moderate) (HCC)    History of 2019 novel coronavirus disease (COVID-19) 09/22/2021   Hyperlipidemia    Hypertension    Long term current use of antithrombotics/antiplatelets    Mitral stenosis 11/16/2021   Osteoarthritis of hip    Pulmonary hypertension (HCC) 12/12/2017   PVD (peripheral vascular disease) (HCC)    Renal artery stenosis (HCC)    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 In no apparent acute distress Vitals:   07/21/23 1400  BP: (!) 155/66  Pulse: 72  Resp: 14  Temp: (!) 97.5 F (36.4 C)  TempSrc: Temporal  SpO2: 100%  Weight: 169 lb (76.7 kg)  Height: 5' (1.524 m)   BMI Assessment: Estimated body mass index is 33.01 kg/m as calculated from the following:   Height as of this encounter: 5' (1.524 m).   Weight as of this encounter: 169 lb (76.7 kg).  BMI interpretation table: BMI level Category Range association with higher incidence of chronic pain  <18 kg/m2 Underweight   18.5-24.9 kg/m2 Ideal body weight   25-29.9 kg/m2 Overweight Increased incidence by 20%  30-34.9 kg/m2 Obese (Class I) Increased incidence by 68%  35-39.9  kg/m2 Severe obesity (Class II) Increased incidence by 136%  >40 kg/m2 Extreme obesity (Class III) Increased incidence by 254%   Patient's current BMI Ideal Body weight  Body mass index is 33.01 kg/m. Ideal body weight: 45.5 kg (100 lb 4.9 oz) Adjusted ideal body weight: 58 kg (127 lb 12.6 oz)   BMI Readings from Last 4 Encounters:  07/21/23 33.01 kg/m  05/10/23 32.12 kg/m  05/04/23 32.12 kg/m  04/26/23 32.61 kg/m   Wt Readings from Last 4 Encounters:  07/21/23 169 lb (76.7 kg)  05/10/23 170 lb (77.1 kg)  05/04/23 170 lb (77.1 kg)  04/26/23 172 lb 9.6 oz (78.3 kg)    Psych/Mental status: Alert, oriented x 3 (person, place, & time)       Eyes: PERLA Respiratory: No evidence of acute respiratory distress  Lumbar Exam  Skin & Axial Inspection: No masses, redness, or swelling Alignment: Symmetrical Functional ROM: Decreased ROM affecting both sides Stability: No instability detected Muscle Tone/Strength: Functionally intact. No obvious neuro-muscular anomalies detected. Sensory (Neurological): Musculoskeletal pain pattern Palpation: No palpable anomalies       Provocative Tests: Hyperextension/rotation test: (+) bilaterally for facet joint pain. Lumbar quadrant test (Kemp's test): (+) bilaterally for facet joint pain. Patrick's Maneuver: deferred today                   FABER* test: deferred today  S-I anterior distraction/compression test: (+)   S-I arthralgia/arthropathy   Gait & Posture Assessment  Ambulation: Limited Gait: Antalgic Posture: Difficulty standing up straight, due to pain    Lower Extremity Exam      Side: Right lower extremity   Side: Left lower extremity  Stability: No instability observed           Stability: No instability observed          Skin & Extremity Inspection: Evidence of prior arthroplastic surgery   Skin & Extremity Inspection: Evidence of prior arthroplastic surgery  Functional ROM: Pain restricted ROM for all joints  of the lower extremity           Functional ROM: Pain restricted ROM for all joints of the lower extremity          Muscle Tone/Strength: Functionally intact. No obvious neuro-muscular anomalies detected.   Muscle Tone/Strength: Functionally intact. No obvious neuro-muscular anomalies detected.  Sensory (Neurological): Musculoskeletal pain pattern         Sensory (Neurological): Musculoskeletal pain pattern        DTR: Patellar: deferred today Achilles: deferred today Plantar: deferred today   DTR: Patellar: deferred today Achilles: deferred today Plantar: deferred today  Palpation: No palpable anomalies   Palpation: No palpable anomalies    Assessment  Primary Diagnosis & Pertinent Problem List: The primary encounter diagnosis was Left thigh pain. Diagnoses of S/P insertion of iliac artery stent, PAD (peripheral artery disease) (HCC), Lumbar facet arthropathy, Chronic radicular lumbar pain, Status post bilateral hip replacements, and Chronic pain syndrome were also pertinent to this visit.  Visit Diagnosis (New problems to examiner): 1. Left thigh pain   2. S/P insertion of iliac artery stent   3. PAD (peripheral artery disease) (HCC)   4. Lumbar facet arthropathy   5. Chronic radicular lumbar pain   6. Status post bilateral hip replacements   7. Chronic pain syndrome    Plan of Care (Initial workup plan)  I discussed treatment options with the patient.  She is reluctant to have injections.  Unfortunately, she did not experience significant benefit after her 2 diagnostic lumbar facet medial branch nerve blocks.  She continues to endorse chronic low back pain and radiating left leg pain.  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  I recommend that she start gabapentin as below.  I discussed the risks with her as well as her daughter who was on the phone during the assessment and plan.  For her left anterior thigh pain which may be related to  quadricep spasms specifically of her vastus medialis, she can consider low-dose tizanidine as below.  We also discussed diagnostic bilateral SI joint injection and piriformis injections for SI joint and piriformis pain.  We also discussed spinal cord stimulation for chronic sensory neuropathic pain of lower extremities.  I will see the patient back in approximately 6 to 8 weeks to assess response and further discuss treatment plan at that time.  Pharmacotherapy (current): Medications ordered:  Meds ordered this encounter  Medications   gabapentin (NEURONTIN) 300 MG capsule    Sig: Take 1 capsule (300 mg total) by mouth at bedtime for 15 days, THEN 1 capsule (300 mg total) 2 (two) times daily for 15 days, THEN 1 capsule (300 mg total) 3 (three) times daily.    Dispense:  135 capsule    Refill:  0   tiZANidine (ZANAFLEX) 4 MG tablet    Sig: Take 0.5-1 tablets (2-4  mg total) by mouth daily as needed for muscle spasms.    Dispense:  90 tablet    Refill:  0    Do not place this medication, or any other prescription from our practice, on "Automatic Refill". Patient may have prescription filled one day early if pharmacy is closed on scheduled refill date.   Medications administered during this visit: Sabrina [name] had no medications administered during this visit.   Analgesic Pharmacotherapy:  Opioid Analgesics: For patients currently taking or requesting to take opioid analgesics, in accordance with [facility] Guidelines, we will assess their risks and indications for the use of these substances. After completing our evaluation, we may offer recommendations, but we no longer take patients for medication management. The prescribing physician will ultimately decide, based on his/her training and level of comfort whether to adopt any of the recommendations, including whether or not to prescribe such medicines.  Membrane stabilizer: To be determined at a later time  Muscle  relaxant: To be determined at a later time  NSAID: To be determined at a later time  Other analgesic(s): To be determined at a later time   Interventional management options: Sabrina [name] was informed that there is no guarantee that she would be a candidate for interventional therapies. The decision will be based on the results of diagnostic studies, as well as Sabrina [name]'s risk profile.  Procedure(s) under consideration:  Diagnostic SI joint and piriformis injection Lateral femoral cutaneous nerve block Spinal cord stimulation   Provider-requested follow-up: Return in about 8 weeks (around 09/15/2023) for MM, F2F.  Future Appointments  Date Time Provider Department Center  07/27/2023 11:00 AM [facility] None  08/01/2023  1:00 PM Rickard Patience, MD [facility] None  08/05/2023  9:30 AM [facility]  08/05/2023 10:30 AM [facility]  08/05/2023 11:30 AM [facility]  08/25/2023  2:20 PM Victorino Sparrow, MD [facility]  05/14/2024  3:00 PM [facility] ANNUAL WELLNESS VISIT [facility]    Duration of encounter: .  Total time on encounter, as per AMA guidelines included both the face-to-face and non-face-to-face time personally spent by the physician and/or other qualified health care professional(s) on the day of the encounter (includes time in activities that require the physician or other qualified health care professional and does not include time in activities normally performed by clinical staff). Physician's time may include the following activities when performed: Preparing to see the patient (e.g., pre-charting review of records, searching for previously ordered imaging, lab work, and nerve conduction tests) Review of prior analgesic pharmacotherapies. Reviewing PMP Interpreting ordered tests (e.g., lab work, imaging, nerve conduction tests) Performing post-procedure evaluations, including interpretation of diagnostic  procedures Obtaining and/or reviewing separately obtained history Performing a medically appropriate examination and/or evaluation Counseling and educating the patient/family/caregiver Ordering medications, tests, or procedures Referring and communicating with other health care professionals (when not separately reported) Documenting clinical information in the electronic or other health record Independently interpreting results (not separately reported) and communicating results to the patient/ family/caregiver Care coordination (not separately reported)  Note by: Edward Jolly, MD (TTS technology used. I apologize for any typographical errors that were not detected and corrected.) Date: 07/21/2023; Time: 2:53 PM

## 2023-07-27 ENCOUNTER — Inpatient Hospital Stay: Payer: Medicare Other | Attending: Oncology

## 2023-07-27 ENCOUNTER — Other Ambulatory Visit: Payer: Self-pay | Admitting: *Deleted

## 2023-07-27 DIAGNOSIS — Z833 Family history of diabetes mellitus: Secondary | ICD-10-CM | POA: Insufficient documentation

## 2023-07-27 DIAGNOSIS — I6523 Occlusion and stenosis of bilateral carotid arteries: Secondary | ICD-10-CM

## 2023-07-27 DIAGNOSIS — Z9049 Acquired absence of other specified parts of digestive tract: Secondary | ICD-10-CM | POA: Diagnosis not present

## 2023-07-27 DIAGNOSIS — Z885 Allergy status to narcotic agent status: Secondary | ICD-10-CM | POA: Diagnosis not present

## 2023-07-27 DIAGNOSIS — Z880 Allergy status to penicillin: Secondary | ICD-10-CM | POA: Insufficient documentation

## 2023-07-27 DIAGNOSIS — Z820 Family history of epilepsy and other diseases of the nervous system: Secondary | ICD-10-CM | POA: Insufficient documentation

## 2023-07-27 DIAGNOSIS — Z8674 Personal history of sudden cardiac arrest: Secondary | ICD-10-CM | POA: Diagnosis not present

## 2023-07-27 DIAGNOSIS — G894 Chronic pain syndrome: Secondary | ICD-10-CM | POA: Insufficient documentation

## 2023-07-27 DIAGNOSIS — Z8616 Personal history of COVID-19: Secondary | ICD-10-CM | POA: Insufficient documentation

## 2023-07-27 DIAGNOSIS — I130 Hypertensive heart and chronic kidney disease with heart failure and stage 1 through stage 4 chronic kidney disease, or unspecified chronic kidney disease: Secondary | ICD-10-CM | POA: Insufficient documentation

## 2023-07-27 DIAGNOSIS — K219 Gastro-esophageal reflux disease without esophagitis: Secondary | ICD-10-CM | POA: Diagnosis not present

## 2023-07-27 DIAGNOSIS — E785 Hyperlipidemia, unspecified: Secondary | ICD-10-CM | POA: Diagnosis not present

## 2023-07-27 DIAGNOSIS — M549 Dorsalgia, unspecified: Secondary | ICD-10-CM | POA: Insufficient documentation

## 2023-07-27 DIAGNOSIS — Z7902 Long term (current) use of antithrombotics/antiplatelets: Secondary | ICD-10-CM | POA: Diagnosis not present

## 2023-07-27 DIAGNOSIS — D631 Anemia in chronic kidney disease: Secondary | ICD-10-CM | POA: Insufficient documentation

## 2023-07-27 DIAGNOSIS — Z8249 Family history of ischemic heart disease and other diseases of the circulatory system: Secondary | ICD-10-CM | POA: Diagnosis not present

## 2023-07-27 DIAGNOSIS — M2550 Pain in unspecified joint: Secondary | ICD-10-CM | POA: Diagnosis not present

## 2023-07-27 DIAGNOSIS — Z90710 Acquired absence of both cervix and uterus: Secondary | ICD-10-CM | POA: Diagnosis not present

## 2023-07-27 DIAGNOSIS — R5383 Other fatigue: Secondary | ICD-10-CM | POA: Insufficient documentation

## 2023-07-27 DIAGNOSIS — Z79899 Other long term (current) drug therapy: Secondary | ICD-10-CM | POA: Insufficient documentation

## 2023-07-27 DIAGNOSIS — N183 Chronic kidney disease, stage 3 unspecified: Secondary | ICD-10-CM | POA: Insufficient documentation

## 2023-07-27 DIAGNOSIS — Z8269 Family history of other diseases of the musculoskeletal system and connective tissue: Secondary | ICD-10-CM | POA: Insufficient documentation

## 2023-07-27 DIAGNOSIS — F419 Anxiety disorder, unspecified: Secondary | ICD-10-CM | POA: Diagnosis not present

## 2023-07-27 DIAGNOSIS — Z95828 Presence of other vascular implants and grafts: Secondary | ICD-10-CM

## 2023-07-27 DIAGNOSIS — Z803 Family history of malignant neoplasm of breast: Secondary | ICD-10-CM | POA: Insufficient documentation

## 2023-07-27 DIAGNOSIS — I5032 Chronic diastolic (congestive) heart failure: Secondary | ICD-10-CM | POA: Diagnosis not present

## 2023-07-27 DIAGNOSIS — Z87891 Personal history of nicotine dependence: Secondary | ICD-10-CM | POA: Diagnosis not present

## 2023-07-27 DIAGNOSIS — I739 Peripheral vascular disease, unspecified: Secondary | ICD-10-CM

## 2023-07-27 DIAGNOSIS — Z888 Allergy status to other drugs, medicaments and biological substances status: Secondary | ICD-10-CM | POA: Diagnosis not present

## 2023-07-27 DIAGNOSIS — Z886 Allergy status to analgesic agent status: Secondary | ICD-10-CM | POA: Insufficient documentation

## 2023-07-27 DIAGNOSIS — Z823 Family history of stroke: Secondary | ICD-10-CM | POA: Insufficient documentation

## 2023-07-27 LAB — CBC WITH DIFFERENTIAL/PLATELET
Abs Immature Granulocytes: 0.01 10*3/uL (ref 0.00–0.07)
Basophils Absolute: 0 10*3/uL (ref 0.0–0.1)
Basophils Relative: 0 %
Eosinophils Absolute: 0.3 10*3/uL (ref 0.0–0.5)
Eosinophils Relative: 6 %
HCT: 30.5 % — ABNORMAL LOW (ref 36.0–46.0)
Hemoglobin: 10 g/dL — ABNORMAL LOW (ref 12.0–15.0)
Immature Granulocytes: 0 %
Lymphocytes Relative: 28 %
Lymphs Abs: 1.5 10*3/uL (ref 0.7–4.0)
MCH: 29.6 pg (ref 26.0–34.0)
MCHC: 32.8 g/dL (ref 30.0–36.0)
MCV: 90.2 fL (ref 80.0–100.0)
Monocytes Absolute: 0.6 10*3/uL (ref 0.1–1.0)
Monocytes Relative: 12 %
Neutro Abs: 2.8 10*3/uL (ref 1.7–7.7)
Neutrophils Relative %: 54 %
Platelets: 268 10*3/uL (ref 150–400)
RBC: 3.38 MIL/uL — ABNORMAL LOW (ref 3.87–5.11)
RDW: 13.5 % (ref 11.5–15.5)
WBC: 5.3 10*3/uL (ref 4.0–10.5)
nRBC: 0 % (ref 0.0–0.2)

## 2023-07-27 LAB — IRON AND TIBC
Iron: 53 ug/dL (ref 28–170)
Saturation Ratios: 16 % (ref 10.4–31.8)
TIBC: 330 ug/dL (ref 250–450)
UIBC: 277 ug/dL

## 2023-07-27 LAB — FERRITIN: Ferritin: 137 ng/mL (ref 11–307)

## 2023-07-28 ENCOUNTER — Encounter: Payer: Self-pay | Admitting: Internal Medicine

## 2023-07-28 DIAGNOSIS — I2089 Other forms of angina pectoris: Secondary | ICD-10-CM | POA: Diagnosis not present

## 2023-07-28 DIAGNOSIS — I2510 Atherosclerotic heart disease of native coronary artery without angina pectoris: Secondary | ICD-10-CM | POA: Diagnosis not present

## 2023-07-28 DIAGNOSIS — E782 Mixed hyperlipidemia: Secondary | ICD-10-CM | POA: Diagnosis not present

## 2023-07-28 DIAGNOSIS — Z951 Presence of aortocoronary bypass graft: Secondary | ICD-10-CM | POA: Diagnosis not present

## 2023-07-28 DIAGNOSIS — I739 Peripheral vascular disease, unspecified: Secondary | ICD-10-CM | POA: Diagnosis not present

## 2023-07-28 DIAGNOSIS — I050 Rheumatic mitral stenosis: Secondary | ICD-10-CM | POA: Diagnosis not present

## 2023-07-28 DIAGNOSIS — J449 Chronic obstructive pulmonary disease, unspecified: Secondary | ICD-10-CM | POA: Diagnosis not present

## 2023-07-28 DIAGNOSIS — I5032 Chronic diastolic (congestive) heart failure: Secondary | ICD-10-CM | POA: Diagnosis not present

## 2023-07-28 DIAGNOSIS — I350 Nonrheumatic aortic (valve) stenosis: Secondary | ICD-10-CM | POA: Diagnosis not present

## 2023-07-28 DIAGNOSIS — K219 Gastro-esophageal reflux disease without esophagitis: Secondary | ICD-10-CM | POA: Diagnosis not present

## 2023-07-28 DIAGNOSIS — I10 Essential (primary) hypertension: Secondary | ICD-10-CM | POA: Diagnosis not present

## 2023-07-28 DIAGNOSIS — R0602 Shortness of breath: Secondary | ICD-10-CM | POA: Diagnosis not present

## 2023-08-01 ENCOUNTER — Encounter: Payer: Self-pay | Admitting: Oncology

## 2023-08-01 ENCOUNTER — Inpatient Hospital Stay (HOSPITAL_BASED_OUTPATIENT_CLINIC_OR_DEPARTMENT_OTHER): Payer: Medicare Other | Admitting: Oncology

## 2023-08-01 VITALS — BP 147/57 | HR 68 | Temp 97.6°F | Resp 18 | Wt 173.1 lb

## 2023-08-01 DIAGNOSIS — D631 Anemia in chronic kidney disease: Secondary | ICD-10-CM

## 2023-08-01 DIAGNOSIS — N1832 Chronic kidney disease, stage 3b: Secondary | ICD-10-CM | POA: Diagnosis not present

## 2023-08-01 DIAGNOSIS — I5032 Chronic diastolic (congestive) heart failure: Secondary | ICD-10-CM | POA: Diagnosis not present

## 2023-08-01 DIAGNOSIS — I130 Hypertensive heart and chronic kidney disease with heart failure and stage 1 through stage 4 chronic kidney disease, or unspecified chronic kidney disease: Secondary | ICD-10-CM | POA: Diagnosis not present

## 2023-08-01 DIAGNOSIS — G894 Chronic pain syndrome: Secondary | ICD-10-CM | POA: Diagnosis not present

## 2023-08-01 DIAGNOSIS — N183 Chronic kidney disease, stage 3 unspecified: Secondary | ICD-10-CM | POA: Diagnosis not present

## 2023-08-01 DIAGNOSIS — R5383 Other fatigue: Secondary | ICD-10-CM | POA: Diagnosis not present

## 2023-08-01 MED ORDER — FERROUS SULFATE 325 (65 FE) MG PO TBEC: 325.0000 mg | DELAYED_RELEASE_TABLET | Freq: Every day | ORAL | Status: DC

## 2023-08-01 MED ORDER — VITAMIN C 250 MG PO TABS: 250.0000 mg | ORAL_TABLET | Freq: Every day | ORAL | 1 refills | Status: DC

## 2023-08-01 NOTE — Progress Notes (Signed)
Hematology/Oncology Progress note [phone] [phone]      Patient Care Team: Sherlene Shams, MD as PCP - General (Internal Medicine) Rickard Patience, MD as Consulting Physician (Oncology)  ASSESSMENT & PLAN:   Anemia of chronic kidney failure, stage 3 (moderate) (HCC) Labs reviewed and discussed with patient Lab Results  Component Value Date   HGB 10.0 (L) 07/27/2023   TIBC 330 07/27/2023   IRONPCTSAT 16 07/27/2023   FERRITIN 137 07/27/2023    Hb is >10, no need for erythropoietin therapy She is not interested in IV Venofer.  Recommend patient to take ferrous sulfate 325mg  daily.  Also recommend patient to take vitamin C 250 mg daily.   CKD (chronic kidney disease), stage III (HCC) Avoid nephro toxins.  Encourage oral hydration Encourage patient to follow up with nephrologist.  Patient's daughter was called during the encounter and updated about the recommendation.  Orders Placed This Encounter  Procedures   CBC with Differential/Platelet    Standing Status:   Future    Standing Expiration Date:   07/31/2024   Comprehensive metabolic panel    Standing Status:   Future    Standing Expiration Date:   07/31/2024   Protein electrophoresis, serum    Standing Status:   Future    Standing Expiration Date:   07/31/2024   Ferritin    Standing Status:   Future    Standing Expiration Date:   01/30/2024   Iron and TIBC    Standing Status:   Future    Standing Expiration Date:   07/31/2024   Retic Panel    Standing Status:   Future    Standing Expiration Date:   07/31/2024   Follow up  3 months  lab prior to MD  All questions were answered. The patient knows to call the clinic with any problems, questions or concerns.  Rickard Patience, MD, PhD [facility] Hematology Oncology 08/01/2023   REASON FOR VISIT Follow up for management of anemia   HISTORY OF PRESENTING ILLNESS:  Sabrina [name] is a  82 y.o.  female with PMH listed below who was referred to me for  evaluation of anemia.  Patient recently had lab work done which revealed anemia with hemoglobin 11.5.   Reviewed patient's previous labs, her hemoglobin ranges from 10.8 to 11.8 in the past.  Reports feeling fatigue.  Patient denies weight loss, easy bruising, hematochezia, hemoptysis.  Has history of CKD and follows up with nephrologist.  Chronic pain syndrome. On Tramadol and fentanyl patch.      INTERVAL HISTORY Sabrina [name] is a 82 y.o. female who has above history reviewed by me today presents for follow up visit for management of anemia.  She reports feeling well.  She is not taking oral iron supplementation, per patient her PCP recommended her to stop.   She feels tried, + joint pain, back pain.    Review of Systems  Constitutional:  Positive for malaise/fatigue. Negative for chills, fever and weight loss.  HENT:  Negative for sore throat.   Eyes:  Negative for redness.  Respiratory:  Negative for cough, shortness of breath and wheezing.   Cardiovascular:  Negative for chest pain, palpitations and leg swelling.  Gastrointestinal:  Negative for abdominal pain, blood in stool, nausea and vomiting.  Genitourinary:  Negative for dysuria.  Musculoskeletal:  Positive for back pain and joint pain. Negative for myalgias.  Skin:  Negative for rash.  Neurological:  Negative for dizziness, tingling and tremors.  Endo/Heme/Allergies:  Does not bruise/bleed easily.  Psychiatric/Behavioral:  Negative for hallucinations.     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 b.) TTE 11/16/2021: EF >55%; mild AS (MPG 9 mmHg)   CAD (coronary artery disease)    a.) s/p 3v CABG 03/29/2000   Cardiac arrest [facility])    Carotid artery stenosis    a.) s/p LEFT CEA 09/09/2003. b.) Carotid doppler 40/98/1191: 1-39% LICA, CTO RICA; subclavian stenosis   Cataracts, bilateral    Cervical spondylosis without myelopathy    Chronic diastolic CHF (congestive heart failure), NYHA class 3 (HCC)    a.) TTE 05/27/2016: EF >55%, mild LA enlargement, triv PR, mild MR, mod TR; G3DD. b.) TTE 12/12/2017: EF >55%, mild LVH, BAE, mild MR/PR, mod TR; RVSP 52.8 mmHg. c.) TTE 03/18/2020: EF >55%, BAD, AS (MPG 8.7 mmHg); triv MR, mild TR/PR. d.) TTE 11/16/2021: EF >55%, LVH, G1DD, triv MR, mild PR, mod TR; AS (MPG 9 mmHg); MS (MPG 5 mmHg)   Chronic kidney disease, stage III (moderate) (HCC)    Chronic narcotic use 06/24/2014   Chronic pain syndrome    GERD (gastroesophageal reflux disease)    History of 2019 novel coronavirus disease (COVID-19) 09/22/2021   Hyperlipidemia    Hypertension    Long term current use of antithrombotics/antiplatelets    a.) on daily DAPT therapy (ASA + clopidogrel)   Lumbar stenosis with neurogenic claudication    Mitral stenosis 11/16/2021   a.) TTE 11/16/2021: EF >55%; mod MS (MPG 5 mmHg)   Osteoarthritis of hip    Postoperative wound infection 03/29/2022   Pulmonary hypertension (HCC) 12/12/2017   a.) TTE 12/12/2017: mild; RVSP 52.8 mmHg   PVD (peripheral vascular disease) (HCC)    Renal artery stenosis (HCC)    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    SURGICAL HISTORY: Past Surgical History:  Procedure Laterality Date   ABDOMINAL AORTOGRAM W/LOWER EXTREMITY N/A 10/06/2022   Procedure: ABDOMINAL AORTOGRAM W/LOWER EXTREMITY;  Surgeon:  Victorino Sparrow, MD;  Location: [facility] INVASIVE CV LAB;  Service: Cardiovascular;  Laterality: N/A;   ABDOMINAL AORTOGRAM W/LOWER EXTREMITY N/A 05/04/2023   Procedure: ABDOMINAL AORTOGRAM W/LOWER EXTREMITY;  Surgeon: Victorino Sparrow, MD;  Location: [facility] INVASIVE CV LAB;  Service: Cardiovascular;  Laterality: N/A;   ABDOMINAL HYSTERECTOMY  1976   CAROTID ARTERY ANGIOPLASTY Left    CAROTID ENDARTERECTOMY Left 09/09/2003   Procedure: CAROTID ENDARTERECTOMY; Location: [facility]; Surgeon: Magda Bernheim, MD   COLONOSCOPY WITH PROPOFOL N/A 08/16/2017   Procedure: COLONOSCOPY WITH PROPOFOL;  Surgeon: Midge Minium, MD;  Location: [facility] ENDOSCOPY;  Service: Endoscopy;  Laterality: N/A;   CORONARY ANGIOPLASTY WITH STENT PLACEMENT  2000   CORONARY ARTERY BYPASS GRAFT N/A 03/29/2000   Procedure: 3v CORONARY ARTERY BYPASS GRAFT; Location: [facility]   CYSTOSCOPY WITH STENT PLACEMENT Bilateral  ENDARTERECTOMY FEMORAL Bilateral 03/17/2022   Procedure: ENDARTERECTOMY FEMORAL ( BILATERAL SFA STENT);  Surgeon: Renford Dills, MD;  Location: [facility] ORS;  Service: Vascular;  Laterality: Bilateral;   ENDARTERECTOMY FEMORAL Right 03/30/2022   Procedure: RE-EXPOSURE OF RIGHT COMMON FEMORAL ARTERY, RE-DO RIGHT FEMORAL ENDARTERECTOMY WITH VEIN PATCH;  Surgeon: Victorino Sparrow, MD;  Location: [facility] OR;  Service: Vascular;  Laterality: Right;  WOUND VAC   ESOPHAGOGASTRODUODENOSCOPY (EGD) WITH PROPOFOL N/A 08/16/2017   Procedure: ESOPHAGOGASTRODUODENOSCOPY (EGD) WITH PROPOFOL;  Surgeon: Midge Minium, MD;  Location: [facility] ENDOSCOPY;  Service: Endoscopy;  Laterality: N/A;   ESOPHAGOGASTRODUODENOSCOPY (EGD) WITH PROPOFOL N/A 06/29/2018   Procedure: ESOPHAGOGASTRODUODENOSCOPY (EGD) WITH PROPOFOL;  Surgeon: Pasty Spillers, MD;  Location: [facility] ENDOSCOPY;  Service: Endoscopy;  Laterality: N/A;   GROIN DEBRIDEMENT Right 03/30/2022   Procedure: RIGHT GROIN IRRIGATION & DEBRIDEMENT;  Surgeon: Victorino Sparrow, MD;  Location: [facility] OR;  Service:  Vascular;  Laterality: Right;   INSERTION OF ILIAC STENT Left 03/17/2022   Procedure: INSERTION OF ILIAC STENT;  Surgeon: Renford Dills, MD;  Location: [facility] ORS;  Service: Vascular;  Laterality: Left;   LAPAROSCOPIC CHOLECYSTECTOMY Left 10/26/1999   Procedure: LAPAROSCOPIC CHOLECYSTECTOMY; Location: [facility]; Surgeon: Renda Rolls, MD   LEFT HEART CATH AND CORONARY ANGIOGRAPHY N/A 11/18/2021   Procedure: LEFT HEART CATH AND CORONARY ANGIOGRAPHY;  Surgeon: Alwyn Pea, MD;  Location: [facility] INVASIVE CV LAB;  Service: Cardiovascular;  Laterality: N/A;   LEFT HEART CATH AND CORS/GRAFTS ANGIOGRAPHY Left 11/19/2002   Procedure: LEFT HEART CATH AND CORS/GRAFTS ANGIOGRAPHY; Location: [facility]; Surgeon: Rudean Hitt, MD   LEFT HEART CATH AND CORS/GRAFTS ANGIOGRAPHY Left 09/17/2003   Procedure: LEFT HEART CATH AND CORS/GRAFTS ANGIOGRAPHY; Location: [facility]; Surgeon: Rudean Hitt, MD   LOWER EXTREMITY ANGIOGRAPHY Right 01/06/2022   Procedure: Lower Extremity Angiography;  Surgeon: Renford Dills, MD;  Location: [facility] INVASIVE CV LAB;  Service: Cardiovascular;  Laterality: Right;   PERIPHERAL VASCULAR BALLOON ANGIOPLASTY  10/06/2022   Procedure: PERIPHERAL VASCULAR BALLOON ANGIOPLASTY;  Surgeon: Victorino Sparrow, MD;  Location: [facility] INVASIVE CV LAB;  Service: Cardiovascular;;  left iliac and bilateral renal   RENAL ARTERY ANGIOPLASTY Bilateral 12/2013   TOE AMPUTATION Right    small toe   TONSILLECTOMY AND ADENOIDECTOMY     TOTAL HIP ARTHROPLASTY Left 2005   TOTAL HIP ARTHROPLASTY Right 2015   UPPER EXTREMITY ANGIOGRAPHY Left 11/23/2021   Procedure: UPPER EXTREMITY ANGIOGRAPHY;  Surgeon: Annice Needy, MD;  Location: [facility] INVASIVE CV LAB;  Service: Cardiovascular;  Laterality: Left;    SOCIAL HISTORY: Social History   Socioeconomic History   Marital status: Divorced    Spouse name: Not on file   Number of children: 3   Years of education: Not on file   Highest education level: Not on file   Occupational History   Occupation: retired  Tobacco Use   Smoking status: Former    Current packs/day: 0.00    Types: Cigarettes    Quit date: 10/25/1998    Years since quitting: 24.7   Smokeless tobacco: Never  Vaping Use   Vaping status: Never Used  Substance and Sexual Activity   Alcohol use: No    Alcohol/week: 0.0 standard drinks of alcohol   Drug use: Never   Sexual activity: Not Currently  Other Topics Concern   Not on file  Social History Narrative   Daughter [name] ([place]); 1 in [employer]; 1 in [place]   Lives alone   Social Determinants of Health  Financial Resource Strain: Low Risk  (05/10/2023)   Overall Financial Resource Strain (CARDIA)    Difficulty of Paying Living Expenses: Not hard at all  Food Insecurity: No Food Insecurity (05/10/2023)   Hunger Vital Sign    Worried About Running Out of Food in the Last Year: Never true    Ran Out of Food in the Last Year: Never true  Transportation Needs: No Transportation Needs (05/10/2023)   PRAPARE - Administrator, Civil Service (Medical): No    Lack of Transportation (Non-Medical): No  Physical Activity: Inactive (05/10/2023)   Exercise Vital Sign    Days of Exercise per Week: 0 days    Minutes of Exercise per Session: 0 min  Stress: No Stress Concern Present (05/10/2023)   [employer] of Occupational Health - Occupational Stress Questionnaire    Feeling of Stress : Not at all  Social Connections: Moderately Integrated (05/10/2023)   Social Connection and Isolation Panel [NHANES]    Frequency of Communication with Friends and Family: More than three times a week    Frequency of Social Gatherings with Friends and Family: More than three times a week    Attends Religious Services: More than 4 times per year    Active Member of [employer] or Organizations: Yes    Attends Engineer, structural: More than 4 times per year    Marital Status: Divorced  Intimate Partner Violence: Not At Risk  (05/10/2023)   Humiliation, Afraid, Rape, and Kick questionnaire    Fear of Current or Ex-Partner: No    Emotionally Abused: No    Physically Abused: No    Sexually Abused: No    FAMILY HISTORY: Family History  Problem Relation Age of Onset   Stroke Mother    Hypertension Mother    Diabetes Mother    Hypertension Father    Heart disease Sister        MI   Multiple sclerosis Daughter    Multiple sclerosis Son    Cerebral aneurysm Son    Seizures Son    Cerebral aneurysm Son    Breast cancer Paternal Aunt 40    ALLERGIES:  is allergic to celexa [citalopram], dilaudid [hydromorphone hcl], effient [prasugrel], hydrochlorothiazide, liothyronine, nsaids, nubain [nalbuphine hcl], penicillins, and statins.  MEDICATIONS:  Current Outpatient Medications  Medication Sig Dispense Refill   amLODipine (NORVASC) 10 MG tablet Take 1 tablet (10 mg total) by mouth at bedtime. 90 tablet 1   aspirin 81 MG tablet Take 81 mg by mouth in the morning.     clopidogrel (PLAVIX) 75 MG tablet Take 1 tablet (75 mg total) by mouth at bedtime. To prevent strokes 90 tablet 1   DULoxetine (CYMBALTA) 30 MG capsule Take 1 capsule (30 mg total) by mouth daily. 90 capsule 1   ezetimibe (ZETIA) 10 MG tablet Take 1 tablet (10 mg total) by mouth at bedtime. 90 tablet 3   ferrous sulfate 325 (65 FE) MG EC tablet Take 1 tablet (325 mg total) by mouth daily.     gabapentin (NEURONTIN) 300 MG capsule Take 1 capsule (300 mg total) by mouth at bedtime for 15 days, THEN 1 capsule (300 mg total) 2 (two) times daily for 15 days, THEN 1 capsule (300 mg total) 3 (three) times daily. 135 capsule 0   hydrALAZINE (APRESOLINE) 50 MG tablet TAKE 1 TABLET BY MOUTH THREE TIMES DAILY AS NEEDED FOR BLOOD PRESSURE GREATER THAN 150 270 tablet 0   HYDROcodone-acetaminophen (NORCO) 10-325 MG tablet  Take 1 tablet by mouth daily as needed for moderate pain. 30 tablet 0   labetalol (NORMODYNE) 300 MG tablet Take 1 tablet (300 mg total) by mouth  2 (two) times daily. For hypertension 180 tablet 1   losartan (COZAAR) 50 MG tablet Take 1 tablet (50 mg total) by mouth at bedtime. 90 tablet 1   nortriptyline (PAMELOR) 10 MG capsule Take 1 capsule (10 mg total) by mouth 2 (two) times daily. For chronic headaches 180 capsule 3   pantoprazole (PROTONIX) 40 MG tablet One tablet upon waking,  take 30 minuts prior to eating 90 tablet 3   ranolazine (RANEXA) 500 MG 12 hr tablet Take 1 tablet (500 mg total) by mouth 2 (two) times daily. 60 tablet 3   tiZANidine (ZANAFLEX) 4 MG tablet Take 0.5-1 tablets (2-4 mg total) by mouth daily as needed for muscle spasms. 90 tablet 0   triamcinolone cream (KENALOG) 0.1 % Apply 1 Application topically 2 (two) times daily. To all areas of rash 453.6 g 0   No current facility-administered medications for this visit.     PHYSICAL EXAMINATION: ECOG PERFORMANCE STATUS: 1 - Symptomatic but completely ambulatory Vitals:   08/01/23 1428  BP: (!) 147/57  Pulse: 68  Resp: 18  Temp: 97.6 F (36.4 C)   Filed Weights   08/01/23 1428  Weight: 173 lb 1.6 oz (78.5 kg)    Physical Exam Constitutional:      General: She is not in acute distress.    Appearance: She is well-developed.  Eyes:     General: No scleral icterus. Cardiovascular:     Rate and Rhythm: Normal rate and regular rhythm.     Heart sounds: Murmur heard.  Pulmonary:     Effort: Pulmonary effort is normal. No respiratory distress.     Breath sounds: No wheezing.  Abdominal:     General: Bowel sounds are normal. There is no distension.     Palpations: Abdomen is soft.  Musculoskeletal:        General: No deformity. Normal range of motion.     Cervical back: Normal range of motion and neck supple.  Lymphadenopathy:     Cervical: No cervical adenopathy.  Skin:    General: Skin is warm and dry.     Findings: No erythema or rash.  Neurological:     Mental Status: She is alert and oriented to person, place, and time. Mental status is at  baseline.     Cranial Nerves: No cranial nerve deficit.  Psychiatric:        Mood and Affect: Mood normal.      LABORATORY DATA:  I have reviewed the data as listed    Latest Ref Rng & Units 07/27/2023   10:36 AM 05/04/2023   10:00 AM 02/17/2023    1:47 PM  CBC  WBC 4.0 - 10.5 K/uL 5.3   9.5   Hemoglobin 12.0 - 15.0 g/dL 16.1  09.6  04.5   Hematocrit 36.0 - 46.0 % 30.5  32.0  35.5   Platelets 150 - 400 K/uL 268   321.0       Latest Ref Rng & Units 05/04/2023   10:00 AM 04/26/2023   11:28 AM 02/17/2023    1:47 PM  CMP  Glucose 70 - 99 mg/dL 95  92  409   BUN 8 - 23 mg/dL 23  27  27    Creatinine 0.44 - 1.00 mg/dL 8.11  9.14  7.82   Sodium 135 -  145 mmol/L 137  135  140   Potassium 3.5 - 5.1 mmol/L 4.2  4.2  3.7   Chloride 98 - 111 mmol/L 104  103  105   CO2 19 - 32 mEq/L  24  23   Calcium 8.4 - 10.5 mg/dL  9.7  9.9   Total Protein 6.0 - 8.3 g/dL  7.0    Total Bilirubin 0.2 - 1.2 mg/dL  0.6    Alkaline Phos 39 - 117 U/L  69    AST 0 - 37 U/L  14    ALT 0 - 35 U/L  10

## 2023-08-01 NOTE — Assessment & Plan Note (Signed)
Avoid nephro toxins.  Encourage oral hydration Encourage patient to follow up with nephrologist.

## 2023-08-01 NOTE — Assessment & Plan Note (Addendum)
Labs reviewed and discussed with patient Lab Results  Component Value Date   HGB 10.0 (L) 07/27/2023   TIBC 330 07/27/2023   IRONPCTSAT 16 07/27/2023   FERRITIN 137 07/27/2023    Hb is >10, no need for erythropoietin therapy She is not interested in IV Venofer.  Recommend patient to take ferrous sulfate 325mg  daily.  Also recommend patient to take vitamin C 250 mg daily.

## 2023-08-04 DIAGNOSIS — R0602 Shortness of breath: Secondary | ICD-10-CM | POA: Diagnosis not present

## 2023-08-05 ENCOUNTER — Ambulatory Visit (HOSPITAL_COMMUNITY)
Admission: RE | Admit: 2023-08-05 | Discharge: 2023-08-05 | Disposition: A | Discharge status: Home / Self Care | Payer: Medicare Other | Source: Ambulatory Visit | Attending: Vascular Surgery | Admitting: Vascular Surgery

## 2023-08-05 ENCOUNTER — Encounter: Payer: Medicare Other | Admitting: Vascular Surgery

## 2023-08-05 ENCOUNTER — Ambulatory Visit (INDEPENDENT_AMBULATORY_CARE_PROVIDER_SITE_OTHER)
Admission: RE | Admit: 2023-08-05 | Discharge: 2023-08-05 | Disposition: A | Discharge status: Home / Self Care | Payer: Medicare Other | Source: Ambulatory Visit | Attending: Vascular Surgery | Admitting: Vascular Surgery

## 2023-08-05 DIAGNOSIS — Z95828 Presence of other vascular implants and grafts: Secondary | ICD-10-CM | POA: Diagnosis not present

## 2023-08-05 DIAGNOSIS — I739 Peripheral vascular disease, unspecified: Secondary | ICD-10-CM | POA: Diagnosis not present

## 2023-08-05 DIAGNOSIS — I6523 Occlusion and stenosis of bilateral carotid arteries: Secondary | ICD-10-CM | POA: Diagnosis not present

## 2023-08-05 LAB — VAS US ABI WITH/WO TBI
Left ABI: 0.65
Right ABI: 0.53

## 2023-08-11 ENCOUNTER — Encounter: Payer: Medicare Other | Admitting: Vascular Surgery

## 2023-08-22 ENCOUNTER — Encounter: Payer: Self-pay | Admitting: Internal Medicine

## 2023-08-23 NOTE — Progress Notes (Deleted)
Office Note    HPI: Sabrina [name] is a 82 y.o. (January 19, 1941) female presenting in follow up s/p right groin exploration, redo CFA patch plasty with ipsilateral GSV on 03/30/22.  Prior surgeries were performed at [facility] and include: Right renal artery stenting 4mm left renal artery stenting (6mm) (2015) Left subclavian artery stenting 9mm (2023) Bilateral common iliac artery stenting Bilateral external iliac stenting Left common femoral, profunda femoris, and superficial femoral artery endarterectomies Right common femoral, profunda femoris, and superficial femoral artery endarterectomies Stent placement to left external iliac artery with 8 mm diameter by 4 cm length of life star stent ___  09/2022 balloon angioplasty of the right EIA, bilateral renal angioplasty   On exam today, Sabrina [name] was doing okay.she continues to have bilateral lower extremity swelling, and chronic tightness.  She denied rest pain or ischemic tissue loss, but noted some claudication.  Denies food fear, weight loss.  Denies stroke, TIA, amaurosis symptoms. Denies abdominal pain, back pain.   The pt is  on a daily aspirin.   Other AC:  plavix The pt is  on medication for hypertension.   The pt is not diabetic.  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 b.) TTE 11/16/2021: EF >55%; mild AS (MPG 9 mmHg)   CAD (coronary artery disease)    a.) s/p 3v CABG 03/29/2000   Cardiac arrest [facility])    Carotid artery stenosis    a.) s/p LEFT CEA 09/09/2003. b.) Carotid doppler 78/29/5621: 1-39% LICA,  CTO RICA; subclavian stenosis   Cataracts, bilateral    Cervical spondylosis without myelopathy    Chronic diastolic CHF (congestive heart failure), NYHA class 3 (HCC)    a.) TTE 05/27/2016: EF >55%, mild LA enlargement, triv PR, mild MR, mod TR; G3DD. b.) TTE 12/12/2017: EF >55%, mild LVH, BAE, mild MR/PR, mod TR; RVSP 52.8 mmHg. c.) TTE 03/18/2020: EF >55%, BAD, AS (MPG 8.7 mmHg); triv MR, mild TR/PR. d.) TTE 11/16/2021: EF >55%, LVH, G1DD, triv MR, mild PR, mod TR; AS (MPG 9 mmHg); MS (MPG 5 mmHg)   Chronic kidney disease, stage III (moderate) (HCC)    Chronic narcotic use 06/24/2014   Chronic pain syndrome    GERD (gastroesophageal reflux disease)    History of 2019 novel coronavirus disease (COVID-19) 09/22/2021   Hyperlipidemia    Hypertension    Long term current use of antithrombotics/antiplatelets    a.) on daily DAPT therapy (ASA + clopidogrel)   Lumbar stenosis with neurogenic claudication    Mitral stenosis 11/16/2021   a.) TTE 11/16/2021: EF >55%; mod MS (MPG 5 mmHg)   Osteoarthritis of hip    Postoperative wound infection 03/29/2022   Pulmonary hypertension (HCC) 12/12/2017   a.) TTE 12/12/2017: mild; RVSP 52.8 mmHg   PVD (peripheral vascular disease) (HCC)    Renal artery stenosis (HCC)    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    Past Surgical History:  Procedure Laterality Date   ABDOMINAL AORTOGRAM W/LOWER EXTREMITY N/A 10/06/2022   Procedure: ABDOMINAL AORTOGRAM W/LOWER EXTREMITY;  Surgeon: Victorino Sparrow, MD;  Location: [facility] INVASIVE CV LAB;  Service: Cardiovascular;  Laterality: N/A;   ABDOMINAL AORTOGRAM W/LOWER EXTREMITY N/A 05/04/2023   Procedure: ABDOMINAL AORTOGRAM W/LOWER EXTREMITY;  Surgeon: Victorino Sparrow, MD;  Location: [facility] INVASIVE CV LAB;   Service: Cardiovascular;  Laterality: N/A;   ABDOMINAL HYSTERECTOMY  1976   CAROTID ARTERY ANGIOPLASTY Left    CAROTID ENDARTERECTOMY Left 09/09/2003   Procedure: CAROTID ENDARTERECTOMY; Location: [facility]; Surgeon: Magda Bernheim, MD   COLONOSCOPY WITH PROPOFOL N/A 08/16/2017   Procedure: COLONOSCOPY WITH PROPOFOL;  Surgeon: Midge Minium, MD;  Location: [facility] ENDOSCOPY;  Service: Endoscopy;  Laterality: N/A;   CORONARY ANGIOPLASTY WITH STENT PLACEMENT  2000   CORONARY ARTERY BYPASS GRAFT N/A 03/29/2000   Procedure: 3v CORONARY ARTERY BYPASS GRAFT; Location: [facility]   CYSTOSCOPY WITH STENT PLACEMENT Bilateral    ENDARTERECTOMY FEMORAL Bilateral 03/17/2022   Procedure: ENDARTERECTOMY FEMORAL ( BILATERAL SFA STENT);  Surgeon: Renford Dills, MD;  Location: [facility] ORS;  Service: Vascular;  Laterality: Bilateral;   ENDARTERECTOMY FEMORAL Right 03/30/2022   Procedure: RE-EXPOSURE OF RIGHT COMMON FEMORAL ARTERY, RE-DO RIGHT FEMORAL ENDARTERECTOMY WITH VEIN PATCH;  Surgeon: Victorino Sparrow, MD;  Location: [facility] OR;  Service: Vascular;  Laterality: Right;  WOUND VAC   ESOPHAGOGASTRODUODENOSCOPY (EGD) WITH PROPOFOL N/A 08/16/2017   Procedure: ESOPHAGOGASTRODUODENOSCOPY (EGD) WITH PROPOFOL;  Surgeon: Midge Minium, MD;  Location: [facility] ENDOSCOPY;  Service: Endoscopy;  Laterality: N/A;   ESOPHAGOGASTRODUODENOSCOPY (EGD) WITH PROPOFOL N/A 06/29/2018   Procedure: ESOPHAGOGASTRODUODENOSCOPY (EGD) WITH PROPOFOL;  Surgeon: Pasty Spillers, MD;  Location: [facility] ENDOSCOPY;  Service: Endoscopy;  Laterality: N/A;   GROIN DEBRIDEMENT Right 03/30/2022   Procedure: RIGHT GROIN IRRIGATION & DEBRIDEMENT;  Surgeon: Victorino Sparrow, MD;  Location: [facility] OR;  Service: Vascular;  Laterality: Right;   INSERTION OF ILIAC STENT Left 03/17/2022   Procedure: INSERTION OF ILIAC STENT;  Surgeon: Renford Dills, MD;  Location: [facility] ORS;  Service: Vascular;  Laterality: Left;   LAPAROSCOPIC CHOLECYSTECTOMY Left 10/26/1999   Procedure:  LAPAROSCOPIC CHOLECYSTECTOMY; Location: [facility]; Surgeon: Renda Rolls, MD   LEFT HEART CATH AND CORONARY ANGIOGRAPHY N/A 11/18/2021   Procedure: LEFT HEART CATH AND CORONARY ANGIOGRAPHY;  Surgeon: Alwyn Pea, MD;  Location: [facility] INVASIVE CV LAB;  Service: Cardiovascular;  Laterality: N/A;   LEFT HEART CATH AND CORS/GRAFTS ANGIOGRAPHY Left 11/19/2002   Procedure: LEFT HEART CATH AND CORS/GRAFTS ANGIOGRAPHY; Location: [facility]; Surgeon: Rudean Hitt, MD   LEFT HEART CATH AND CORS/GRAFTS ANGIOGRAPHY Left 09/17/2003   Procedure: LEFT HEART CATH AND CORS/GRAFTS ANGIOGRAPHY; Location: [facility]; Surgeon: Rudean Hitt, MD   LOWER EXTREMITY ANGIOGRAPHY Right 01/06/2022   Procedure: Lower Extremity Angiography;  Surgeon: Renford Dills, MD;  Location: [facility] INVASIVE CV LAB;  Service: Cardiovascular;  Laterality: Right;   PERIPHERAL VASCULAR BALLOON ANGIOPLASTY  10/06/2022   Procedure: PERIPHERAL VASCULAR BALLOON ANGIOPLASTY;  Surgeon: Victorino Sparrow, MD;  Location: [facility] INVASIVE CV LAB;  Service: Cardiovascular;;  left iliac and bilateral renal   RENAL ARTERY ANGIOPLASTY Bilateral 12/2013   TOE AMPUTATION Right    small toe   TONSILLECTOMY AND ADENOIDECTOMY     TOTAL HIP ARTHROPLASTY Left 2005   TOTAL HIP ARTHROPLASTY Right 2015   UPPER EXTREMITY ANGIOGRAPHY Left 11/23/2021   Procedure: UPPER EXTREMITY ANGIOGRAPHY;  Surgeon: Annice Needy, MD;  Location: [facility] INVASIVE CV LAB;  Service: Cardiovascular;  Laterality: Left;    Social History   Socioeconomic History   Marital status: Divorced    Spouse name: Not on file   Number of children: 3   Years of education: Not on file   Highest education level: Not on file  Occupational History   Occupation: retired  Tobacco Use   Smoking status: Former    Current packs/day: 0.00    Types: Cigarettes    Quit date: 10/25/1998    Years since quitting: 24.8   Smokeless tobacco: Never  Vaping Use   Vaping status: Never Used  Substance and Sexual  Activity   Alcohol use: No    Alcohol/week: 0.0 standard drinks of alcohol   Drug use: Never   Sexual activity: Not Currently  Other Topics Concern   Not on file  Social History Narrative   Daughter Zella Ball ([place]); 1 in [employer]; 1 in [place]   Lives alone   Social Determinants of Health   Financial Resource Strain: Low Risk  (05/10/2023)   Overall Financial Resource Strain (CARDIA)    Difficulty of Paying Living Expenses: Not hard at all  Food Insecurity: No Food Insecurity (05/10/2023)   Hunger Vital Sign    Worried About Running Out of Food in the Last Year: Never true    Ran Out of Food in the Last Year: Never true  Transportation Needs: No Transportation Needs (05/10/2023)   PRAPARE - Administrator, Civil Service (Medical): No    Lack of Transportation (Non-Medical): No  Physical Activity: Inactive (05/10/2023)   Exercise Vital Sign    Days of Exercise per Week: 0 days    Minutes of Exercise per Session: 0 min  Stress: No Stress Concern Present (05/10/2023)   [employer] of Occupational Health - Occupational Stress Questionnaire    Feeling of Stress : Not at all  Social Connections: Moderately Integrated (05/10/2023)   Social Connection and Isolation Panel [NHANES]    Frequency of Communication with Friends and Family: More than three times a week    Frequency of Social Gatherings with Friends and Family: More than three times a week    Attends Religious Services: More than 4 times per year    Active Member of [employer] or Organizations: Yes    Attends Engineer, structural: More than 4 times per year    Marital Status: Divorced  Intimate Partner Violence: Not At Risk (05/10/2023)   Humiliation, Afraid, Rape, and Kick questionnaire    Fear of Current or Ex-Partner: No    Emotionally Abused: No    Physically Abused: No    Sexually Abused: No    Family History  Problem Relation Age of Onset   Stroke Mother    Hypertension Mother    Diabetes  Mother    Hypertension Father    Heart disease Sister        MI   Multiple sclerosis Daughter    Multiple sclerosis Son    Cerebral aneurysm Son    Seizures Son    Cerebral aneurysm Son    Breast cancer Paternal Aunt 50    Current Outpatient Medications  Medication Sig Dispense Refill   amLODipine (NORVASC) 10 MG tablet Take 1 tablet (10 mg total) by mouth at bedtime. 90 tablet 1   aspirin 81 MG tablet Take 81 mg by mouth in the morning.     clopidogrel (PLAVIX) 75 MG  tablet Take 1 tablet (75 mg total) by mouth at bedtime. To prevent strokes 90 tablet 1   DULoxetine (CYMBALTA) 30 MG capsule Take 1 capsule (30 mg total) by mouth daily. 90 capsule 1   ezetimibe (ZETIA) 10 MG tablet Take 1 tablet (10 mg total) by mouth at bedtime. 90 tablet 3   ferrous sulfate 325 (65 FE) MG EC tablet Take 1 tablet (325 mg total) by mouth daily.     gabapentin (NEURONTIN) 300 MG capsule Take 1 capsule (300 mg total) by mouth at bedtime for 15 days, THEN 1 capsule (300 mg total) 2 (two) times daily for 15 days, THEN 1 capsule (300 mg total) 3 (three) times daily. 135 capsule 0   hydrALAZINE (APRESOLINE) 50 MG tablet TAKE 1 TABLET BY MOUTH THREE TIMES DAILY AS NEEDED FOR BLOOD PRESSURE GREATER THAN 150 270 tablet 0   labetalol (NORMODYNE) 300 MG tablet Take 1 tablet (300 mg total) by mouth 2 (two) times daily. For hypertension 180 tablet 1   losartan (COZAAR) 50 MG tablet Take 1 tablet (50 mg total) by mouth at bedtime. 90 tablet 1   nortriptyline (PAMELOR) 10 MG capsule Take 1 capsule (10 mg total) by mouth 2 (two) times daily. For chronic headaches 180 capsule 3   pantoprazole (PROTONIX) 40 MG tablet One tablet upon waking,  take 30 minuts prior to eating 90 tablet 3   ranolazine (RANEXA) 500 MG 12 hr tablet Take 1 tablet (500 mg total) by mouth 2 (two) times daily. 60 tablet 3   tiZANidine (ZANAFLEX) 4 MG tablet Take 0.5-1 tablets (2-4 mg total) by mouth daily as needed for muscle spasms. 90 tablet 0    triamcinolone cream (KENALOG) 0.1 % Apply 1 Application topically 2 (two) times daily. To all areas of rash 453.6 g 0   vitamin C (ASCORBIC ACID) 250 MG tablet Take 1 tablet (250 mg total) by mouth daily. 90 tablet 1   No current facility-administered medications for this visit.    Allergies  Allergen Reactions   Celexa [Citalopram] Anaphylaxis    Throat closing    Dilaudid [Hydromorphone Hcl] Nausea And Vomiting   Effient [Prasugrel] Itching   Hydrochlorothiazide Other (See Comments)    Decreased GFR (Nov 2015)   Liothyronine     Hair fell out, caused headaches    Nsaids     CKD stage III - avoid nephrotoxic drugs   Nubain [Nalbuphine Hcl]     Burning sensation in back   Penicillins Itching   Statins Itching     REVIEW OF SYSTEMS:   [X]  denotes positive finding, [ ]  denotes negative finding Cardiac  Comments:  Chest pain or chest pressure:    Shortness of breath upon exertion:    Short of breath when lying flat:    Irregular heart rhythm:        Vascular    Pain in calf, thigh, or hip brought on by ambulation:    Pain in feet at night that wakes you up from your sleep:     Blood clot in your veins:    Leg swelling:         Pulmonary    Oxygen at home:    Productive cough:     Wheezing:         Neurologic    Sudden weakness in arms or legs:     Sudden numbness in arms or legs:     Sudden onset of difficulty speaking or slurred speech:  Temporary loss of vision in one eye:     Problems with dizziness:         Gastrointestinal    Blood in stool:     Vomited blood:         Genitourinary    Burning when urinating:     Blood in urine:        Psychiatric    Major depression:         Hematologic    Bleeding problems:    Problems with blood clotting too easily:        Skin    Rashes or ulcers:        Constitutional    Fever or chills:      PHYSICAL EXAMINATION:  There were no vitals filed for this visit.   General:  WDWN in NAD; vital signs  documented above Gait: Not observed HENT: WNL, normocephalic Pulmonary: normal non-labored breathing , without wheezing Cardiac: regular HR, Abdomen: soft, NT, no masses Skin: without rashes Vascular Exam/Pulses:  Right Left  Radial 2+ (normal) 2+ (normal)  Ulnar 2+ (normal) 2+ (normal)  Femoral    Popliteal    DP nonpalpable nonpalpable  PT nonpalpable nonpalpable   Extremities: without ischemic changes, without Gangrene , without cellulitis; without open wounds;  Right groin wound with 3 small areas opened, probe less than 1 cm, Musculoskeletal: no muscle wasting or atrophy  Neurologic: A&O X 3;  No focal weakness or paresthesias are detected Psychiatric:  The pt has Normal affect.   Non-Invasive Vascular Imaging:    Summary:  Right Carotid: Evidence consistent with a total occlusion of the right  ICA.   Left Carotid: Velocities in the left ICA are consistent with a 1-39%  stenosis.               Unable to visualized subclavian stent.   Vertebrals:  Bilateral vertebral arteries demonstrate antegrade flow.  Subclavians: Bilateral subclavian arteries were stenotic.   ABI Findings:  +---------+------------------+-----+----------+--------+  Right   Rt Pressure (mmHg)IndexWaveform  Comment   +---------+------------------+-----+----------+--------+  Brachial 170                                        +---------+------------------+-----+----------+--------+  PTA     96                0.51 monophasic          +---------+------------------+-----+----------+--------+  DP      100               0.53 monophasic          +---------+------------------+-----+----------+--------+  Great Toe79                0.42 Abnormal            +---------+------------------+-----+----------+--------+   +---------+------------------+-----+----------+-------+  Left    Lt Pressure (mmHg)IndexWaveform  Comment   +---------+------------------+-----+----------+-------+  Brachial 189                                       +---------+------------------+-----+----------+-------+  PTA     122               0.65 monophasic         +---------+------------------+-----+----------+-------+  DP      114  0.60 biphasic           +---------+------------------+-----+----------+-------+  Great Toe82                0.43 Abnormal           +---------+------------------+-----+----------+-------+   +-------+-----------+-----------+------------+------------+  ABI/TBIToday's ABIToday's TBIPrevious ABIPrevious TBI  +-------+-----------+-----------+------------+------------+  Right 0.53       0.42       0.53        0.38          +-------+-----------+-----------+------------+------------+  Left  0.65       0.43       0.60        0.40          +-------+-----------+-----------+------------+------------+     ASSESSMENT/PLAN: Sabrina [name] is a 82 y.o. female presenting in follow up s/p right groin exploration, redo CFA patch plasty with ipsilateral GSV.  In December 2023 she underwent right-sided EIA drug-coated balloon angioplasty, bilateral renal artery angioplasty for assisted primary patency of bilateral renal artery stents.  Lovene has undergone multiple lower extremity operations-see above.  At her last visit, she had elevated velocities in the right common iliac artery at the level of the previously placed stents.  I decided to treat conservatively with plans to repeat angiography should velocities increase.  On today's duplex ultrasound, velocities and increased in the left-sided and right-sided stents.  By definition, the stents are threatened, and I am concerned that the current level of stenosis could result in occlusion.  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  After discussing risk and benefits of bilateral lower extremity angiography with emphasis on iliac artery stenting bilaterally, devils elected to proceed.  CKD unchanged, no concern for TIA, stroke, amaurosis -plan for renal stent and carotid duplex at her next visit.   Victorino Sparrow, MD [facility] [phone]  Total time of patient care including pre-visit research, consultation, and documentation greater than 40 minutes

## 2023-08-25 ENCOUNTER — Encounter: Payer: Self-pay | Admitting: Internal Medicine

## 2023-08-25 ENCOUNTER — Telehealth: Payer: Self-pay

## 2023-08-25 ENCOUNTER — Encounter: Payer: Medicare Other | Admitting: Vascular Surgery

## 2023-08-25 ENCOUNTER — Ambulatory Visit (INDEPENDENT_AMBULATORY_CARE_PROVIDER_SITE_OTHER): Payer: Medicare Other | Admitting: Internal Medicine

## 2023-08-25 VITALS — BP 154/68 | HR 77 | Ht 60.0 in | Wt 174.2 lb

## 2023-08-25 DIAGNOSIS — M47816 Spondylosis without myelopathy or radiculopathy, lumbar region: Secondary | ICD-10-CM

## 2023-08-25 DIAGNOSIS — N3941 Urge incontinence: Secondary | ICD-10-CM

## 2023-08-25 DIAGNOSIS — R609 Edema, unspecified: Secondary | ICD-10-CM | POA: Diagnosis not present

## 2023-08-25 LAB — BASIC METABOLIC PANEL
BUN: 23 mg/dL (ref 6–23)
CO2: 25 meq/L (ref 19–32)
Calcium: 9.8 mg/dL (ref 8.4–10.5)
Chloride: 106 meq/L (ref 96–112)
Creatinine, Ser: 1.5 mg/dL — ABNORMAL HIGH (ref 0.40–1.20)
GFR: 32.48 mL/min — ABNORMAL LOW (ref >60.00)
Glucose, Bld: 90 mg/dL (ref 70–99)
Potassium: 3.9 meq/L (ref 3.5–5.1)
Sodium: 140 meq/L (ref 135–145)

## 2023-08-25 MED ORDER — FUROSEMIDE 20 MG PO TABS
20.0000 mg | ORAL_TABLET | Freq: Every day | ORAL | 3 refills | Status: DC
Start: 2023-08-25 — End: 2023-11-28

## 2023-08-25 NOTE — Patient Instructions (Signed)
I am prescribing furosemide to take once daily  until the fluid in your legs is reduced.    It works pretty quickly  and will  make you urinate several times over  several hours   PLEASE REVIEW YOUR MEDICATIONS AT HOME AND INFORM ME OF ANY NEW /RECENT CHANGES

## 2023-08-25 NOTE — Telephone Encounter (Signed)
Patient states both of her legs and feet are swollen bigger than her head, tight, shiny, hard. Patient states this started about a week ago.  Patient states she wants to be sure she doesn't have a blood clot.  I did schedule an appointment for patient to see Dr. Duncan Dull today at 11am.  I tranferred call to Access Nurse.

## 2023-08-25 NOTE — Progress Notes (Signed)
Subjective:  Patient ID: Sabrina [name], female    DOB: Mar 22, 1941  Age: 82 y.o. MRN: [id number]  CC: The primary encounter diagnosis was Edema, unspecified type. A diagnosis of Urge incontinence of urine was also pertinent to this visit.   HPI Sabrina [name] presents for  Chief Complaint  Patient presents with   Edema   NEW ONSET BILATERAL LE EDEMA NOTICED FIRST ONE WEEK AGO.  DENIES CALF PAIN , RECENT SURGERY,  RECENT TRAVEL ,  ORTHOPNEA AND DYSPNEA  Sh e has had a 5 lb weight gain in the last month  She thinks she has started a new medication in the last 3 months,  not sure what medication  , not sure who prescribed it.   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  .       Outpatient Medications Prior to Visit  Medication Sig Dispense Refill   amLODipine (NORVASC) 10 MG tablet Take 1 tablet (10 mg total) by mouth at bedtime. 90 tablet 1   aspirin 81 MG tablet Take 81 mg by mouth in the morning.     clopidogrel (PLAVIX) 75 MG tablet Take 1 tablet (75 mg total) by mouth at bedtime. To prevent strokes 90 tablet 1   DULoxetine (CYMBALTA) 30 MG capsule Take 1 capsule (30 mg total) by mouth daily. 90 capsule 1   ezetimibe (ZETIA) 10 MG tablet Take 1 tablet (10 mg total) by mouth at bedtime. 90 tablet 3   ferrous sulfate 325 (65 FE) MG EC tablet Take 1 tablet (325 mg total) by mouth daily.     gabapentin (NEURONTIN) 300 MG capsule Take 1 capsule (300 mg total) by mouth at bedtime for 15 days, THEN 1 capsule (300 mg total) 2 (two) times daily for 15 days, THEN 1 capsule (300 mg total) 3 (three) times daily. 135 capsule 0   hydrALAZINE (APRESOLINE) 50 MG tablet TAKE 1 TABLET BY MOUTH THREE TIMES DAILY AS NEEDED FOR BLOOD PRESSURE GREATER THAN 150 270 tablet 0   labetalol (NORMODYNE) 300 MG tablet Take 1 tablet (300 mg total) by mouth 2  (two) times daily. For hypertension 180 tablet 1   losartan (COZAAR) 50 MG tablet Take 1 tablet (50 mg total) by mouth at bedtime. 90 tablet 1   nortriptyline (PAMELOR) 10 MG capsule Take 1 capsule (10 mg total) by mouth 2 (two) times daily. For chronic headaches 180 capsule 3   pantoprazole (PROTONIX) 40 MG tablet One tablet upon waking,  take 30 minuts prior to eating 90 tablet 3   ranolazine (RANEXA) 500 MG 12 hr tablet Take 1 tablet (500 mg total) by mouth 2 (two) times daily. 60 tablet 3   tiZANidine (ZANAFLEX) 4 MG tablet Take 0.5-1 tablets (2-4 mg total) by mouth daily as needed for muscle spasms. 90 tablet 0   triamcinolone cream (KENALOG) 0.1 % Apply 1 Application topically 2 (two) times daily. To all areas of rash 453.6 g 0   vitamin C (ASCORBIC ACID) 250 MG tablet Take 1 tablet (250 mg total) by mouth daily. 90 tablet 1   No facility-administered medications prior to visit.    Review of Systems;  Patient denies headache, fevers, malaise, unintentional weight loss, skin rash, eye pain, sinus congestion and sinus pain, sore throat, dysphagia,  hemoptysis , cough,  dyspnea, wheezing, chest pain, palpitations, orthopnea, edema, abdominal pain, nausea, melena, diarrhea, constipation, flank pain, dysuria, hematuria, urinary  Frequency, nocturia, numbness, tingling, seizures,  Focal weakness, Loss of consciousness,  Tremor, insomnia, depression, anxiety, and suicidal ideation.      Objective:  BP (!) 154/68   Pulse 77   Ht 5' (1.524 m)   Wt 174 lb 3.2 oz (79 kg)   SpO2 97%   BMI 34.02 kg/m   BP Readings from Last 3 Encounters:  08/25/23 (!) 154/68  08/01/23 (!) 147/57  07/21/23 (!) 155/66    Wt Readings from Last 3 Encounters:  08/25/23 174 lb 3.2 oz (79 kg)  08/01/23 173 lb 1.6 oz (78.5 kg)  07/21/23 169 lb (76.7 kg)    Physical Exam Vitals reviewed.  Constitutional:      General: She is not in acute distress.    Appearance: Normal appearance. She is normal weight. She  is not ill-appearing, toxic-appearing or diaphoretic.  HENT:     Head: Normocephalic.  Eyes:     General: No scleral icterus.       Right eye: No discharge.        Left eye: No discharge.     Conjunctiva/sclera: Conjunctivae normal.  Cardiovascular:     Rate and Rhythm: Normal rate and regular rhythm.     Heart sounds: Normal heart sounds.  Pulmonary:     Effort: Pulmonary effort is normal. No respiratory distress.     Breath sounds: Normal breath sounds.  Musculoskeletal:        General: Normal range of motion.  Skin:    General: Skin is warm and dry.  Neurological:     General: No focal deficit present.     Mental Status: She is alert and oriented to person, place, and time. Mental status is at baseline.  Psychiatric:        Mood and Affect: Mood normal.        Behavior: Behavior normal.        Thought Content: Thought content normal.        Judgment: Judgment normal.    Lab Results  Component Value Date   HGBA1C 5.9 07/19/2016   HGBA1C 5.8 06/02/2015    Lab Results  Component Value Date   CREATININE 1.50 (H) 08/25/2023   CREATININE 1.70 (H) 05/04/2023   CREATININE 1.70 (H) 04/26/2023    Lab Results  Component Value Date   WBC 5.3 07/27/2023   HGB 10.0 (L) 07/27/2023   HCT 30.5 (L) 07/27/2023   PLT 268 07/27/2023   GLUCOSE 90 08/25/2023   CHOL 187 04/26/2023   TRIG 51.0 04/26/2023   HDL 61.00 04/26/2023   LDLDIRECT 107.0 04/26/2023   LDLCALC 116 (H) 04/26/2023   ALT 10 04/26/2023   AST 14 04/26/2023   NA 140 08/25/2023   K 3.9 08/25/2023   CL 106 08/25/2023   CREATININE 1.50 (H) 08/25/2023   BUN 23 08/25/2023   CO2 25 08/25/2023   TSH 4.68 04/13/2023   INR 1.3 (H) 03/31/2022   HGBA1C 5.9 07/19/2016    VAS US AORTA/IVC/ILIACS  Result Date: 08/05/2023 ABDOMINAL AORTA STUDY Patient Name:  Sabrina [name]  Date of Exam:   08/05/2023 Medical Rec #: [id number]           Accession #:    [id number] Date of Birth: September 09, 1941           Patient Gender: F  Patient Age:   7 years Exam Location:  [facility] Procedure:      VAS US AORTA/IVC/ILIACS Referring Phys: JOSHUA ROBINS --------------------------------------------------------------------------------  Indications: Stent evaluations Risk Factors: Hypertension, hyperlipidemia, coronary artery disease. Other Factors: Aortogram 05/04/2023 revealed widely patent stents in the common                Iliac arteries bilaterally. Widely patent stents in the external                iliac arteries bilaterall. Vascular Interventions: 10/06/2022 Right external iliac drug-coated balloon                         angioplasty 6 x 40 mm . Bilateral selective renal artery                         angiogram. Left renal artery balloon angioplasty 6 x 20                         mm. Right renal artery angioplasty 4 x 20 mm                          03/17/22 left CFA, PFA, SFA endarterectomies, right CFA,                         PFA, SFA endarterectomies, left EIA stent with 8mm x 4cm                         life star stent. Limitations: Air/bowel gas, obesity and patient discomfort. Breathing artifact.  Performing Technologist: Elita Quick RVT  Examination Guidelines: A complete evaluation includes B-mode imaging, spectral Doppler, color Doppler, and power Doppler as needed of all accessible portions of each vessel. Bilateral testing is considered an integral part of a complete examination. Limited examinations for reoccurring indications may be performed as noted.  Abdominal Aorta Findings: +-------------+-------+----------+----------+----------+--------+--------------+ Location     AP (cm)Trans (cm)PSV (cm/s)Waveform  ThrombusComments       +-------------+-------+----------+----------+----------+--------+--------------+ Proximal                      79                                         +-------------+-------+----------+----------+----------+--------+--------------+ Mid                            114                                        +-------------+-------+----------+----------+----------+--------+--------------+ Distal                        132                                        +-------------+-------+----------+----------+----------+--------+--------------+ RT CIA Prox                   167       monophasic                       +-------------+-------+----------+----------+----------+--------+--------------+  RT CIA Mid                    201       monophasic        brisk          +-------------+-------+----------+----------+----------+--------+--------------+ RT EIA Mid                    255       monophasic        brisk          +-------------+-------+----------+----------+----------+--------+--------------+ RT EIA Distal                 391       monophasic        brisk , no                                                               plaque                                                                   visualized     +-------------+-------+----------+----------+----------+--------+--------------+ LT CIA Prox                   316       monophasic        brisk ,                                                                  tortuous, NWV  +-------------+-------+----------+----------+----------+--------+--------------+ LT CIA Distal                 234       monophasic        brisk , curve  +-------------+-------+----------+----------+----------+--------+--------------+ LT EIA Prox                   263       monophasic        brisk . NWV    +-------------+-------+----------+----------+----------+--------+--------------+ LT EIA Mid                    247       biphasic                         +-------------+-------+----------+----------+----------+--------+--------------+ LT EIA Distal                 215       monophasic        brisk, curve    +-------------+-------+----------+----------+----------+--------+--------------+  Summary: Stenosis: +--------------------+-------------+ Location            Stenosis      +--------------------+-------------+ Right Common Iliac  >50% stenosis +--------------------+-------------+ Left Common Iliac   >50% stenosis +--------------------+-------------+ Right External Iliac>50% stenosis +--------------------+-------------+ Left External Iliac >50% stenosis +--------------------+-------------+  IVC/Iliac: Suboptimal, limited exam. Unable to visualize stents. Further imaging modalities may be waranted in the future.  *See table(s) above for measurements and observations.  Electronically signed by Carolynn Sayers on 08/05/2023 at 12:29:21 PM.    Final    VAS US CAROTID  Result Date: 08/05/2023 Carotid Arterial Duplex Study Patient Name:  [facility]  Date of Exam:   08/05/2023 Medical Rec #: [id number]           Accession #:    [id number] Date of Birth: 05/30/1941           Patient Gender: F Patient Age:   16 years Exam Location:  [facility] Procedure:      VAS US CAROTID Referring Phys: Ivin Booty ROBINS --------------------------------------------------------------------------------  Indications:       Carotid artery disease. Known right ICA occlusion Risk Factors:      Hypertension, hyperlipidemia, past history of smoking,                    coronary artery disease. Other Factors:     Left Lifestream stent to left subclavian 11/23/2021. History                    of left carotid endarterectomy at [facility] Comparison Study:  No significant change since prior exam of 04/22/2023. Performing Technologist: Elita Quick RVT  Examination Guidelines: A complete evaluation includes B-mode imaging, spectral Doppler, color Doppler, and power Doppler as needed of all accessible portions of each vessel. Bilateral testing is considered an integral part of a complete examination. Limited examinations for  reoccurring indications may be performed as noted.  Right Carotid Findings: +----------+--------+--------+--------+------------------+--------+           PSV cm/sEDV cm/sStenosisPlaque DescriptionComments +----------+--------+--------+--------+------------------+--------+ CCA Prox  69      1               heterogenous               +----------+--------+--------+--------+------------------+--------+ CCA Mid   62      2               heterogenous               +----------+--------+--------+--------+------------------+--------+ CCA Distal21      1               heterogenous               +----------+--------+--------+--------+------------------+--------+ ICA Prox                  Occluded                           +----------+--------+--------+--------+------------------+--------+ ICA Mid                   Occluded                           +----------+--------+--------+--------+------------------+--------+ ICA Distal                Occluded                           +----------+--------+--------+--------+------------------+--------+ ECA       104     17                                         +----------+--------+--------+--------+------------------+--------+ +----------+--------+-------+--------+-------------------+  PSV cm/sEDV cmsDescribeArm Pressure (mmHG) +----------+--------+-------+--------+-------------------+ [id number]     12     Stenotic170                 +----------+--------+-------+--------+-------------------+ +---------+--------+--+--------+--+---------+ VertebralPSV cm/s66EDV cm/s14Antegrade +---------+--------+--+--------+--+---------+  Left Carotid Findings: +----------+--------+--------+--------+------------------+--------+           PSV cm/sEDV cm/sStenosisPlaque DescriptionComments +----------+--------+--------+--------+------------------+--------+ CCA Prox  157     17                                          +----------+--------+--------+--------+------------------+--------+ CCA Mid   129     21                                         +----------+--------+--------+--------+------------------+--------+ CCA Distal86      19              heterogenous               +----------+--------+--------+--------+------------------+--------+ ICA Prox  98      32      1-39%   heterogenous               +----------+--------+--------+--------+------------------+--------+ ICA Mid   86      26                                         +----------+--------+--------+--------+------------------+--------+ ICA Distal91      28                                         +----------+--------+--------+--------+------------------+--------+ ECA       109     15              calcific                   +----------+--------+--------+--------+------------------+--------+ +----------+--------+--------+--------+-------------------+           PSV cm/sEDV cm/sDescribeArm Pressure (mmHG) +----------+--------+--------+--------+-------------------+ [id number]     7       Stenotic189                 +----------+--------+--------+--------+-------------------+ +---------+--------+--+--------+-+---------+ VertebralPSV cm/s33EDV cm/s9Antegrade +---------+--------+--+--------+-+---------+   Summary: Right Carotid: Evidence consistent with a total occlusion of the right ICA. Left Carotid: Velocities in the left ICA are consistent with a 1-39% stenosis.               Unable to visualized subclavian stent. Vertebrals:  Bilateral vertebral arteries demonstrate antegrade flow. Subclavians: Bilateral subclavian arteries were stenotic. *See table(s) above for measurements and observations.  Electronically signed by Carolynn Sayers on 08/05/2023 at 12:28:20 PM.    Final    VAS [place] ABI WITH/WO TBI  Result Date: 08/05/2023  LOWER EXTREMITY DOPPLER STUDY Patient Name:  Cythina A Rockett  Date of Exam:    08/05/2023 Medical Rec #: [id number]           Accession #:    [id number] Date of Birth: [birth year]           Patient Gender: F Patient Age:   61 years Exam Location:  [facility] Procedure:  VAS [place] ABI WITH/WO TBI Referring Phys: JOSHUA ROBINS --------------------------------------------------------------------------------  Indications: Claudication, and peripheral artery disease. High Risk Factors: Hypertension, hyperlipidemia, past history of smoking,                    coronary artery disease.  Vascular Interventions: 10/06/2022 Right external iliac drug-coated balloon                         angioplasty 6 x 40 mm . Bilateral selective renal artery                         angiogram. Left renal artery balloon angioplasty 6 x 20                         mm. Right renal artery angioplasty 4 x 20 mm                          03/17/22 left CFA, PFA, SFA endarterectomies, right CFA,                         PFA, SFA endarterectomies, left EIA stent with 8mm x 4cm                         life star stent. Performing Technologist: Elita Quick RVT  Examination Guidelines: A complete evaluation includes at minimum, Doppler waveform signals and systolic blood pressure reading at the level of bilateral brachial, anterior tibial, and posterior tibial arteries, when vessel segments are accessible. Bilateral testing is considered an integral part of a complete examination. Photoelectric Plethysmograph (PPG) waveforms and toe systolic pressure readings are included as required and additional duplex testing as needed. Limited examinations for reoccurring indications may be performed as noted.  ABI Findings: +---------+------------------+-----+----------+--------+ Right    Rt Pressure (mmHg)IndexWaveform  Comment  +---------+------------------+-----+----------+--------+ Brachial 170                                       +---------+------------------+-----+----------+--------+ PTA      96                 0.51 monophasic         +---------+------------------+-----+----------+--------+ DP       100               0.53 monophasic         +---------+------------------+-----+----------+--------+ Great Toe79                0.42 Abnormal           +---------+------------------+-----+----------+--------+ +---------+------------------+-----+----------+-------+ Left     Lt Pressure (mmHg)IndexWaveform  Comment +---------+------------------+-----+----------+-------+ Brachial 189                                      +---------+------------------+-----+----------+-------+ PTA      122               0.65 monophasic        +---------+------------------+-----+----------+-------+ DP       114               0.60 biphasic          +---------+------------------+-----+----------+-------+  Great Toe82                0.43 Abnormal          +---------+------------------+-----+----------+-------+ +-------+-----------+-----------+------------+------------+ ABI/TBIToday's ABIToday's TBIPrevious ABIPrevious TBI +-------+-----------+-----------+------------+------------+ Right  0.53       0.42       0.53        0.38         +-------+-----------+-----------+------------+------------+ Left   0.65       0.43       0.60        0.40         +-------+-----------+-----------+------------+------------+  Bilateral ABIs and TBIs appear essentially unchanged.  Summary: Right: Resting right ankle-brachial index indicates moderate right lower extremity arterial disease. The right toe-brachial index is abnormal. Left: Resting left ankle-brachial index indicates moderate left lower extremity arterial disease. The left toe-brachial index is abnormal. *See table(s) above for measurements and observations.  Electronically signed by Carolynn Sayers on 08/05/2023 at 12:17:22 PM.    Final     Assessment & Plan:  .Edema, unspecified type Assessment & Plan: SHE HAS NO EVIDENCE OF HEART FAILURE OR DVT  ON EXAM.  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  Referring to Urogyn for evaluation   Orders: -     Ambulatory referral to Urology  Other orders -     Furosemide; Take 1 tablet (20 mg total) by mouth daily.  Dispense: 30 tablet; Refill: 3     I provided 30 minutes of face-to-face time during this encounter reviewing patient's last visit with me, patient's  most recent visit with cardiology,  nephrology,  and neurology,  recent surgical and non surgical procedures, previous  labs and imaging studies, counseling on currently addressed issues,  and post visit ordering to diagnostics and therapeutics .   Follow-up: No follow-ups on file.   Sherlene Shams, MD

## 2023-08-25 NOTE — Telephone Encounter (Signed)
Scheduled for today at 11am

## 2023-08-28 DIAGNOSIS — N3941 Urge incontinence: Secondary | ICD-10-CM | POA: Insufficient documentation

## 2023-08-28 DIAGNOSIS — R609 Edema, unspecified: Secondary | ICD-10-CM | POA: Insufficient documentation

## 2023-08-28 NOTE — Assessment & Plan Note (Signed)
SHE HAS NO EVIDENCE OF HEART FAILURE OR DVT ON EXAM.  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  Rec judicious use of furosemide, as the use of compression stockings is relatively contraindicated because of her PAD

## 2023-08-28 NOTE — Assessment & Plan Note (Signed)
Chronic,  with no prior evaluation .  Referring to Urogyn for evaluation

## 2023-09-02 ENCOUNTER — Telehealth: Payer: Self-pay

## 2023-09-02 NOTE — Telephone Encounter (Signed)
Pt notified about Furosemide.

## 2023-09-02 NOTE — Telephone Encounter (Signed)
Patient states a nurse called from our office and told her to not take a certain medication or to take it every other day or every two days.  Patient states she is not sure of the name of the medication.  Patient states it was early in the morning when they called and she was kind of asleep.  Patient states she would like to have some clarification.  Patient states she would like for [place] to please call her today so she will know what to do over the weekend.

## 2023-09-12 DIAGNOSIS — N2581 Secondary hyperparathyroidism of renal origin: Secondary | ICD-10-CM | POA: Diagnosis not present

## 2023-09-12 DIAGNOSIS — N179 Acute kidney failure, unspecified: Secondary | ICD-10-CM | POA: Diagnosis not present

## 2023-09-12 DIAGNOSIS — I10 Essential (primary) hypertension: Secondary | ICD-10-CM | POA: Diagnosis not present

## 2023-09-12 DIAGNOSIS — N1832 Chronic kidney disease, stage 3b: Secondary | ICD-10-CM | POA: Diagnosis not present

## 2023-09-12 DIAGNOSIS — D631 Anemia in chronic kidney disease: Secondary | ICD-10-CM | POA: Diagnosis not present

## 2023-09-12 DIAGNOSIS — N184 Chronic kidney disease, stage 4 (severe): Secondary | ICD-10-CM | POA: Diagnosis not present

## 2023-09-13 ENCOUNTER — Encounter: Payer: Self-pay | Admitting: Student in an Organized Health Care Education/Training Program

## 2023-09-13 ENCOUNTER — Ambulatory Visit
Payer: Medicare Other | Attending: Student in an Organized Health Care Education/Training Program | Admitting: Student in an Organized Health Care Education/Training Program

## 2023-09-13 VITALS — BP 162/67 | HR 72 | Temp 97.0°F | Resp 16 | Ht 60.0 in | Wt 167.0 lb

## 2023-09-13 DIAGNOSIS — M5416 Radiculopathy, lumbar region: Secondary | ICD-10-CM | POA: Insufficient documentation

## 2023-09-13 DIAGNOSIS — G8929 Other chronic pain: Secondary | ICD-10-CM | POA: Insufficient documentation

## 2023-09-13 DIAGNOSIS — G894 Chronic pain syndrome: Secondary | ICD-10-CM | POA: Insufficient documentation

## 2023-09-13 DIAGNOSIS — I739 Peripheral vascular disease, unspecified: Secondary | ICD-10-CM | POA: Insufficient documentation

## 2023-09-13 DIAGNOSIS — M47816 Spondylosis without myelopathy or radiculopathy, lumbar region: Secondary | ICD-10-CM | POA: Diagnosis not present

## 2023-09-13 DIAGNOSIS — Z96643 Presence of artificial hip joint, bilateral: Secondary | ICD-10-CM | POA: Insufficient documentation

## 2023-09-13 MED ORDER — METHOCARBAMOL 1000 MG/10ML IJ SOLN
INTRAMUSCULAR | Status: AC
  Filled 2023-09-13: qty 10

## 2023-09-13 MED ORDER — PREGABALIN 25 MG PO CAPS
ORAL_CAPSULE | ORAL | 0 refills | Status: DC
Start: 2023-09-13 — End: 2023-11-03

## 2023-09-13 MED ORDER — KETOROLAC TROMETHAMINE 30 MG/ML IJ SOLN
30.0000 mg | Freq: Once | INTRAMUSCULAR | Status: AC
  Administered 2023-09-13: 30 mg via INTRAMUSCULAR

## 2023-09-13 MED ORDER — KETOROLAC TROMETHAMINE 30 MG/ML IJ SOLN
INTRAMUSCULAR | Status: AC
  Filled 2023-09-13: qty 1

## 2023-09-13 MED ORDER — METHOCARBAMOL 1000 MG/10ML IJ SOLN
200.0000 mg | Freq: Once | INTRAMUSCULAR | Status: AC
  Administered 2023-09-13: 200 mg via INTRAMUSCULAR

## 2023-09-13 NOTE — Progress Notes (Signed)
PROVIDER NOTE: Information contained herein reflects review and annotations entered in association with encounter. Interpretation of such information and data should be left to medically-trained personnel. Information provided to patient can be located elsewhere in the medical record under "Patient Instructions". Document created using STT-dictation technology, any transcriptional errors that may result from process are unintentional.    Patient: Sabrina [name]  Service Category: E/M  Provider: Edward Jolly, MD  DOB: [birth year]  DOS: 09/13/2023  Referring Provider: Sherlene Shams, MD  MRN: [id number]  Specialty: Interventional Pain Management  PCP: Sherlene Shams, MD  Type: Established Patient  Setting: Ambulatory outpatient    Location: Office  Delivery: Face-to-face     HPI  Sabrina [name], a 82 y.o. year old female, is here today because of her PAD (peripheral artery disease) (HCC) [I73.9]. Sabrina [name] primary complain today is Back Pain (Lumbar bilateral), Hip Pain (Bilateral ), Leg Pain (Bilateral ), and Other (Groin pain bilateral )  Pertinent problems: Sabrina [name] has Hip pain, bilateral; Peripheral vascular disease (HCC); Generalized anxiety disorder; Chronic right hip pain; Cervical spondylosis without myelopathy; Chronic radicular lumbar pain; Lumbar stenosis with neurogenic claudication; Osteoarthritis of right hip; Chronic pain syndrome; and Lumbar facet arthropathy on their pertinent problem list. Pain Assessment: Severity of Chronic pain is reported as a 0-No pain/10. Location: Back (groin bilateral) Lower, Left (see visit info for all other sites.)/denies. Onset: More than a month ago. Quality: Discomfort, Dull, Throbbing. Timing: Intermittent. Modifying factor(s): rest. Vitals:  height is 5' (1.524 m) and weight is 167 lb (75.8 kg). Her temporal temperature is 97 F (36.1 C) (abnormal). Her blood pressure is 162/67 (abnormal) and her pulse is 72. Her respiration is 16  and oxygen saturation is 100%.  BMI: Estimated body mass index is 32.61 kg/m as calculated from the following:   Height as of this encounter: 5' (1.524 m).   Weight as of this encounter: 167 lb (75.8 kg). Last encounter: 07/21/2023. Last procedure: Visit date not found.  Reason for encounter: patient-requested evaluation.  The patient, with a history of chronic lower back pain, hip pain, and groin pain, presents for a follow-up visit. The pain is primarily in the lower back, followed by the hip and groin. The patient reports that the pain is exacerbated when lifting their legs, particularly when bending. The pain also extends to the front of their thighs, but only when lifting their legs.  The patient has had a limited response to previous injections and has not found relief from the muscle relaxer, tizanidine (Zanaflex). They have been taking gabapentin 300mg  at bedtime for chronic pain management.  The patient also has a history of kidney disease, which is reportedly stable according to a recent consultation with a nephrologist. They are on Plavix, an antiplatelet medication, and Cymbalta, an antidepressant also used for chronic pain management.  The patient denies any relief from the muscle relaxer, Zanaflex. Despite these ongoing treatments, the patient's pain persists, impacting their daily activities and quality of life.  HPI from last visit: Sabrina [name] is being evaluated for possible interventional pain management therapies for the treatment of her chronic pain.  Patient is a pleasant 82 year old female who presents with a chief complaint of low back pain as well as left anterior thigh pain and muscle spasms.  She states that the left anterior thigh pain has gotten worse.  She does have a history of peripheral arterial disease and iliac stents in place.  She feels that her left thigh  pain may be related to a complication after her iliac stent procedure.  Of note, the patient has see me in  the past, over 3 years ago for similar low back pain.  This was related to lumbar facet arthropathy and spondylosis.  She went on to have 2 diagnostic lumbar facet medial branch nerve blocks which unfortunately were not helpful.  Since then, she has been seeing neurology.  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  She is here to discuss options for chronic pain management.   ROS  Constitutional: Denies any fever or chills Gastrointestinal: No reported hemesis, hematochezia, vomiting, or acute GI distress Musculoskeletal:  As above Neurological: No reported episodes of acute onset apraxia, aphasia, dysarthria, agnosia, amnesia, paralysis, loss of coordination, or loss of consciousness  Medication Review  DULoxetine, amLODipine, aspirin, clopidogrel, ezetimibe, ferrous sulfate, furosemide, hydrALAZINE, labetalol, losartan, nortriptyline, pantoprazole, pregabalin, ranolazine, triamcinolone cream, and vitamin C  History Review  Allergy: Sabrina [name] is allergic to celexa [citalopram], dilaudid [hydromorphone hcl], effient [prasugrel], hydrochlorothiazide, liothyronine, nsaids, nubain [nalbuphine hcl], penicillins, and statins. Drug: Sabrina [name]  reports no history of drug use. Alcohol:  reports no history of alcohol use. Tobacco:  reports that she quit smoking about 24 years ago. Her smoking use included cigarettes. She has never used smokeless tobacco. Social: Sabrina [name]  reports that she quit smoking about 24 years ago. Her smoking use included cigarettes. She has never used smokeless tobacco. She reports that she does not drink alcohol and does not use drugs. Medical:  has a past medical history of Abdominal aortic ectasia (HCC) (07/13/2017), Amputation of fifth toe, right, traumatic, subsequent encounter (HCC) (06/18/2019), Anemia of chronic kidney failure, Anxiety, Aortic stenosis (03/18/2020), CAD (coronary artery  disease), Cardiac arrest [facility]), Carotid artery stenosis, Cataracts, bilateral, Cervical spondylosis without myelopathy, Chronic diastolic CHF (congestive heart failure), NYHA class 3 (HCC), Chronic kidney disease, stage III (moderate) (HCC), Chronic narcotic use (06/24/2014), Chronic pain syndrome, GERD (gastroesophageal reflux disease), History of 2019 novel coronavirus disease (COVID-19) (09/22/2021), Hyperlipidemia, Hypertension, Long term current use of antithrombotics/antiplatelets, Lumbar stenosis with neurogenic claudication, Mitral stenosis (11/16/2021), Osteoarthritis of hip, Postoperative wound infection (03/29/2022), Pulmonary hypertension (HCC) (12/12/2017), PVD (peripheral vascular disease) (HCC), Renal artery stenosis (HCC), 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 Surgical: Sabrina [name]  has a past surgical history that includes Laparoscopic cholecystectomy (Left, 10/26/1999); Tonsillectomy and adenoidectomy; Abdominal hysterectomy (1976); Total hip arthroplasty (Left, 2005); Carotid angioplasty (Left); Coronary angioplasty with stent (2000); Toe amputation (Right); Cystoscopy with stent placement (Bilateral); Total hip arthroplasty (Right, 2015); Coronary artery bypass graft (N/A, 03/29/2000); Carotid endarterectomy (Left, 09/09/2003); Renal artery angioplasty (Bilateral, 12/2013); Colonoscopy with propofol (N/A, 08/16/2017); Esophagogastroduodenoscopy (egd) with propofol (N/A, 08/16/2017); Esophagogastroduodenoscopy (egd) with propofol (N/A, 06/29/2018); LEFT HEART CATH AND CORONARY ANGIOGRAPHY (N/A, 11/18/2021); Upper Extremity Angiography (Left, 11/23/2021); Lower Extremity Angiography (Right, 01/06/2022); LEFT HEART CATH AND CORS/GRAFTS ANGIOGRAPHY (Left, 11/19/2002); LEFT HEART CATH AND CORS/GRAFTS ANGIOGRAPHY (Left, 09/17/2003); Endarterectomy femoral (Bilateral, 03/17/2022); Insertion of iliac stent (Left,  03/17/2022); Groin debridement (Right, 03/30/2022); Endarterectomy femoral (Right, 03/30/2022); ABDOMINAL AORTOGRAM W/LOWER EXTREMITY (N/A, 10/06/2022); PERIPHERAL VASCULAR BALLOON ANGIOPLASTY (10/06/2022); and ABDOMINAL AORTOGRAM W/LOWER EXTREMITY (N/A, 05/04/2023). Family: family history includes Breast cancer (age of onset: 36) in her paternal aunt; Cerebral aneurysm in her son and son; Diabetes in her mother; Heart disease in her sister; Hypertension in her father and mother; Multiple sclerosis in her daughter and  son; Seizures in her son; Stroke in her mother.  Laboratory Chemistry Profile   Renal Lab Results  Component Value Date   BUN 23 08/25/2023   CREATININE 1.50 (H) 08/25/2023   LABCREA 86 03/06/2018   GFR 32.48 (L) 08/25/2023   GFRAA 50 (L) 03/10/2020   GFRNONAA 36 (L) 04/14/2022    Hepatic Lab Results  Component Value Date   AST 14 04/26/2023   ALT 10 04/26/2023   ALBUMIN 3.8 04/26/2023   ALKPHOS 69 04/26/2023   LIPASE 16.0 07/06/2017    Electrolytes Lab Results  Component Value Date   NA 140 08/25/2023   K 3.9 08/25/2023   CL 106 08/25/2023   CALCIUM 9.8 08/25/2023   MG 2.5 (H) 03/31/2022   PHOS 3.8 09/23/2022    Bone No results found for: "VD25OH", "VD125OH2TOT", "UE4540JW1", "XB1478GN5", "25OHVITD1", "25OHVITD2", "25OHVITD3", "TESTOFREE", "TESTOSTERONE"  Inflammation (CRP: Acute Phase) (ESR: Chronic Phase) Lab Results  Component Value Date   CRP <1.0 02/17/2023   ESRSEDRATE 53 (H) 02/17/2023   LATICACIDVEN 1.0 03/30/2022         Note: Above Lab results reviewed.  Recent Imaging Review  VAS US AORTA/IVC/ILIACS ABDOMINAL AORTA STUDY  Patient Name:  Sabrina [name]  Date of Exam:   08/05/2023 Medical Rec #: [id number]           Accession #:    [id number] Date of Birth: [birth year]           Patient Gender: F Patient Age:   5 years Exam Location:  [facility] Procedure:      VAS US AORTA/IVC/ILIACS Referring Phys: Ivin Booty  ROBINS  --------------------------------------------------------------------------------   Indications: Stent evaluations  Risk Factors: Hypertension, hyperlipidemia, coronary artery disease.  Other Factors: Aortogram 05/04/2023 revealed widely patent stents in the common                Iliac arteries bilaterally. Widely patent stents in the external                iliac arteries bilaterall.  Vascular Interventions: 10/06/2022 Right external iliac drug-coated balloon                         angioplasty 6 x 40 mm . Bilateral selective renal artery                         angiogram. Left renal artery balloon angioplasty 6 x 20                         mm. Right renal artery angioplasty 4 x 20 mm                           03/17/22 left CFA, PFA, SFA endarterectomies, right CFA,                         PFA, SFA endarterectomies, left EIA stent with 8mm x 4cm                         life star stent.  Limitations: Air/bowel gas, obesity and patient discomfort. Breathing artifact.   Performing Technologist: Elita Quick RVT    Examination Guidelines: A complete evaluation includes B-mode imaging, spectral Doppler, color Doppler, and power Doppler as needed of all accessible portions of each vessel. Bilateral testing  is considered an integral part of a complete examination. Limited examinations for reoccurring indications may be performed as noted.    Abdominal Aorta Findings: +-------------+-------+----------+----------+----------+--------+--------------+ Location     AP (cm)Trans (cm)PSV (cm/s)Waveform  ThrombusComments       +-------------+-------+----------+----------+----------+--------+--------------+ Proximal                      79                                         +-------------+-------+----------+----------+----------+--------+--------------+ Mid                           114                                         +-------------+-------+----------+----------+----------+--------+--------------+ Distal                        132                                        +-------------+-------+----------+----------+----------+--------+--------------+ RT CIA Prox                   167       monophasic                       +-------------+-------+----------+----------+----------+--------+--------------+ RT CIA Mid                    201       monophasic        brisk          +-------------+-------+----------+----------+----------+--------+--------------+ RT EIA Mid                    255       monophasic        brisk          +-------------+-------+----------+----------+----------+--------+--------------+ RT EIA Distal                 391       monophasic        brisk , no                                                               plaque                                                                   visualized     +-------------+-------+----------+----------+----------+--------+--------------+ LT CIA Prox                   316       monophasic        brisk ,  tortuous, NWV  +-------------+-------+----------+----------+----------+--------+--------------+ LT CIA Distal                 234       monophasic        brisk , curve  +-------------+-------+----------+----------+----------+--------+--------------+ LT EIA Prox                   263       monophasic        brisk . NWV    +-------------+-------+----------+----------+----------+--------+--------------+ LT EIA Mid                    247       biphasic                         +-------------+-------+----------+----------+----------+--------+--------------+ LT EIA Distal                 215       monophasic        brisk, curve    +-------------+-------+----------+----------+----------+--------+--------------+     Summary: Stenosis: +--------------------+-------------+ Location            Stenosis      +--------------------+-------------+ Right Common Iliac  >50% stenosis +--------------------+-------------+ Left Common Iliac   >50% stenosis +--------------------+-------------+ Right External Iliac>50% stenosis +--------------------+-------------+ Left External Iliac >50% stenosis +--------------------+-------------+     IVC/Iliac: Suboptimal, limited exam. Unable to visualize stents. Further imaging modalities may be waranted in the future.   *See table(s) above for measurements and observations.   Electronically signed by Carolynn Sayers on 08/05/2023 at 12:29:21 PM.       Final   VAS US CAROTID Carotid Arterial Duplex Study  Patient Name:  Sabrina [name]  Date of Exam:   08/05/2023 Medical Rec #: [id number]           Accession #:    [id number] Date of Birth: [birth year]           Patient Gender: F Patient Age:   76 years Exam Location:  [facility] Procedure:      VAS US CAROTID Referring Phys: Ivin Booty ROBINS  --------------------------------------------------------------------------------   Indications:       Carotid artery disease. Known right ICA occlusion Risk Factors:      Hypertension, hyperlipidemia, past history of smoking,                    coronary artery disease. Other Factors:     Left Lifestream stent to left subclavian 11/23/2021. History                    of left carotid endarterectomy at [facility] Comparison Study:  No significant change since prior exam of 04/22/2023.  Performing Technologist: Elita Quick RVT    Examination Guidelines: A complete evaluation includes B-mode imaging, spectral Doppler, color Doppler, and power Doppler as needed of all accessible portions of each vessel. Bilateral testing is considered an integral part of a  complete examination. Limited examinations for reoccurring indications may be performed as noted.    Right Carotid Findings: +----------+--------+--------+--------+------------------+--------+           PSV cm/sEDV cm/sStenosisPlaque DescriptionComments +----------+--------+--------+--------+------------------+--------+ CCA Prox  69      1               heterogenous               +----------+--------+--------+--------+------------------+--------+ CCA Mid   62      2  heterogenous               +----------+--------+--------+--------+------------------+--------+ CCA Distal21      1               heterogenous               +----------+--------+--------+--------+------------------+--------+ ICA Prox                  Occluded                           +----------+--------+--------+--------+------------------+--------+ ICA Mid                   Occluded                           +----------+--------+--------+--------+------------------+--------+ ICA Distal                Occluded                           +----------+--------+--------+--------+------------------+--------+ ECA       104     17                                         +----------+--------+--------+--------+------------------+--------+  +----------+--------+-------+--------+-------------------+           PSV cm/sEDV cmsDescribeArm Pressure (mmHG) +----------+--------+-------+--------+-------------------+ [id number]     12     Stenotic170                 +----------+--------+-------+--------+-------------------+  +---------+--------+--+--------+--+---------+ VertebralPSV cm/s66EDV cm/s14Antegrade +---------+--------+--+--------+--+---------+     Left Carotid Findings: +----------+--------+--------+--------+------------------+--------+           PSV cm/sEDV cm/sStenosisPlaque  DescriptionComments +----------+--------+--------+--------+------------------+--------+ CCA Prox  157     17                                         +----------+--------+--------+--------+------------------+--------+ CCA Mid   129     21                                         +----------+--------+--------+--------+------------------+--------+ CCA Distal86      19              heterogenous               +----------+--------+--------+--------+------------------+--------+ ICA Prox  98      32      1-39%   heterogenous               +----------+--------+--------+--------+------------------+--------+ ICA Mid   86      26                                         +----------+--------+--------+--------+------------------+--------+ ICA Distal91      28                                         +----------+--------+--------+--------+------------------+--------+ ECA  109     15              calcific                   +----------+--------+--------+--------+------------------+--------+  +----------+--------+--------+--------+-------------------+           PSV cm/sEDV cm/sDescribeArm Pressure (mmHG) +----------+--------+--------+--------+-------------------+ [id number]     7       Stenotic189                 +----------+--------+--------+--------+-------------------+  +---------+--------+--+--------+-+---------+ VertebralPSV cm/s33EDV cm/s9Antegrade +---------+--------+--+--------+-+---------+        Summary: Right Carotid: Evidence consistent with a total occlusion of the right ICA.  Left Carotid: Velocities in the left ICA are consistent with a 1-39% stenosis.               Unable to visualized subclavian stent.  Vertebrals:  Bilateral vertebral arteries demonstrate antegrade flow. Subclavians: Bilateral subclavian arteries were stenotic.  *See table(s) above for measurements and observations.    Electronically  signed by Carolynn Sayers on 08/05/2023 at 12:28:20 PM.      Final   VAS [place] ABI WITH/WO TBI  LOWER EXTREMITY DOPPLER STUDY  Patient Name:  Sabrina [name]  Date of Exam:   08/05/2023 Medical Rec #: [id number]           Accession #:    [id number] Date of Birth: November 20, 1940           Patient Gender: F Patient Age:   73 years Exam Location:  [facility] Procedure:      VAS [place] ABI WITH/WO TBI Referring Phys: JOSHUA ROBINS  --------------------------------------------------------------------------------   Indications: Claudication, and peripheral artery disease.  High Risk Factors: Hypertension, hyperlipidemia, past history of smoking,                    coronary artery disease.   Vascular Interventions: 10/06/2022 Right external iliac drug-coated balloon                         angioplasty 6 x 40 mm . Bilateral selective renal artery                         angiogram. Left renal artery balloon angioplasty 6 x 20                         mm. Right renal artery angioplasty 4 x 20 mm                           03/17/22 left CFA, PFA, SFA endarterectomies, right CFA,                         PFA, SFA endarterectomies, left EIA stent with 8mm x 4cm                         life star stent.  Performing Technologist: Elita Quick RVT    Examination Guidelines: A complete evaluation includes at minimum, Doppler waveform signals and systolic blood pressure reading at the level of bilateral brachial, anterior tibial, and posterior tibial arteries, when vessel segments are accessible. Bilateral testing is considered an integral part of a complete examination. Photoelectric Plethysmograph (PPG) waveforms and toe systolic pressure readings are included as required and additional duplex testing as needed. Limited  examinations for reoccurring indications may be performed as noted.    ABI Findings: +---------+------------------+-----+----------+--------+ Right    Rt Pressure  (mmHg)IndexWaveform  Comment  +---------+------------------+-----+----------+--------+ Brachial 170                                       +---------+------------------+-----+----------+--------+ PTA      96                0.51 monophasic         +---------+------------------+-----+----------+--------+ DP       100               0.53 monophasic         +---------+------------------+-----+----------+--------+ Great Toe79                0.42 Abnormal           +---------+------------------+-----+----------+--------+  +---------+------------------+-----+----------+-------+ Left     Lt Pressure (mmHg)IndexWaveform  Comment +---------+------------------+-----+----------+-------+ Brachial 189                                      +---------+------------------+-----+----------+-------+ PTA      122               0.65 monophasic        +---------+------------------+-----+----------+-------+ DP       114               0.60 biphasic          +---------+------------------+-----+----------+-------+ Great Toe82                0.43 Abnormal          +---------+------------------+-----+----------+-------+  +-------+-----------+-----------+------------+------------+ ABI/TBIToday's ABIToday's TBIPrevious ABIPrevious TBI +-------+-----------+-----------+------------+------------+ Right  0.53       0.42       0.53        0.38         +-------+-----------+-----------+------------+------------+ Left   0.65       0.43       0.60        0.40         +-------+-----------+-----------+------------+------------+     Bilateral ABIs and TBIs appear essentially unchanged.   Summary: Right: Resting right ankle-brachial index indicates moderate right lower extremity arterial disease. The right toe-brachial index is abnormal.  Left: Resting left ankle-brachial index indicates moderate left lower extremity arterial disease. The left  toe-brachial index is abnormal.  *See table(s) above for measurements and observations.    Electronically signed by Carolynn Sayers on 08/05/2023 at 12:17:22 PM.      Final   Note: Reviewed        Physical Exam  General appearance: Well nourished, well developed, and well hydrated. In no apparent acute distress Mental status: Alert, oriented x 3 (person, place, & time)       Respiratory: No evidence of acute respiratory distress Eyes: PERLA Vitals: BP (!) 162/67 (BP Location: Right Arm, Patient Position: Sitting, Cuff Size: Normal)   Pulse 72   Temp (!) 97 F (36.1 C) (Temporal)   Resp 16   Ht 5' (1.524 m)   Wt 167 lb (75.8 kg)   SpO2 100%   BMI 32.61 kg/m  BMI: Estimated body mass index is 32.61 kg/m as calculated from the following:   Height as of this encounter: 5' (1.524 m).   Weight  as of this encounter: 167 lb (75.8 kg). Ideal: Ideal body weight: 45.5 kg (100 lb 4.9 oz) Adjusted ideal body weight: 57.6 kg (126 lb 15.8 oz)  Assessment   Diagnosis  1. PAD (peripheral artery disease) (HCC)   2. Lumbar facet arthropathy   3. Chronic radicular lumbar pain   4. Status post bilateral hip replacements   5. Chronic pain syndrome       Plan of Care  Chronic Lower Back Pain with Associated Hip and Groin Pain   She experiences chronic lower back pain with associated hip and groin pain, which worsens with leg lifting. Previous treatments, including injections and tizanidine, have had limited success. Gabapentin will be discontinued due to its ineffectiveness. We will start pregabalin 25 mg at bedtime for 1-2 weeks, then increase to 50 mg at bedtime for chronic pain management, targeting neuropathic pain without using narcotics. Due to her use of Plavix, anti-inflammatories are contraindicated. Today, she will receive two injections: one anti-inflammatory and one muscle relaxant to address the overall pain and specifically the groin and hip pain. A follow-up appointment is  scheduled in 6-8 weeks.  Pharmacotherapy (Medications Ordered): Meds ordered this encounter  Medications   pregabalin (LYRICA) 25 MG capsule    Sig: Take 1 capsule (25 mg total) by mouth at bedtime for 15 days, THEN 2 capsules (50 mg total) at bedtime.    Dispense:  105 capsule    Refill:  0   ketorolac (TORADOL) 30 MG/ML injection 30 mg   methocarbamol (ROBAXIN) injection 200 mg   Orders:  No orders of the defined types were placed in this encounter.  Follow-up plan:   Return in about 8 weeks (around 11/08/2023) for MM, F2F.      Recent Visits Date Type Provider Dept  07/21/23 Office Visit Edward Jolly, MD [facility]  Showing recent visits within past 90 days and meeting all other requirements Today's Visits Date Type Provider Dept  09/13/23 Office Visit Edward Jolly, MD [facility]  Showing today's visits and meeting all other requirements Future Appointments No visits were found meeting these conditions. Showing future appointments within next 90 days and meeting all other requirements  I discussed the assessment and treatment plan with the patient. The patient was provided an opportunity to ask questions and all were answered. The patient agreed with the plan and demonstrated an understanding of the instructions.  Patient advised to call back or seek an in-person evaluation if the symptoms or condition worsens.  Duration of encounter: .  Total time on encounter, as per AMA guidelines included both the face-to-face and non-face-to-face time personally spent by the physician and/or other qualified health care professional(s) on the day of the encounter (includes time in activities that require the physician or other qualified health care professional and does not include time in activities normally performed by clinical staff). Physician's time may include the following activities when performed: Preparing to see the patient (e.g., pre-charting  review of records, searching for previously ordered imaging, lab work, and nerve conduction tests) Review of prior analgesic pharmacotherapies. Reviewing PMP Interpreting ordered tests (e.g., lab work, imaging, nerve conduction tests) Performing post-procedure evaluations, including interpretation of diagnostic procedures Obtaining and/or reviewing separately obtained history Performing a medically appropriate examination and/or evaluation Counseling and educating the patient/family/caregiver Ordering medications, tests, or procedures Referring and communicating with other health care professionals (when not separately reported) Documenting clinical information in the electronic or other health record Independently interpreting results (not separately reported)  and communicating results to the patient/ family/caregiver Care coordination (not separately reported)  Note by: Edward Jolly, MD Date: 09/13/2023; Time: 3:11 PM

## 2023-09-13 NOTE — Progress Notes (Signed)
Safety precautions to be maintained throughout the outpatient stay will include: orient to surroundings, keep bed in low position, maintain call bell within reach at all times, provide assistance with transfer out of bed and ambulation.  

## 2023-09-14 NOTE — Progress Notes (Unsigned)
Office Note    HPI: Sabrina [name] is a 82 y.o. (11-01-1940) female presenting in follow up s/p right groin exploration, redo CFA patch plasty with ipsilateral GSV on 03/30/22.  Prior surgeries were performed at [facility] and include: Right renal artery stenting 4mm left renal artery stenting (6mm) (2015) Left subclavian artery stenting 9mm (2023) Bilateral common iliac artery stenting Bilateral external iliac stenting Left common femoral, profunda femoris, and superficial femoral artery endarterectomies Right common femoral, profunda femoris, and superficial femoral artery endarterectomies Stent placement to left external iliac artery with 8 mm diameter by 4 cm length of life star stent ___  09/2022 balloon angioplasty of the right EIA, bilateral renal angioplasty   On exam today, Sabrina [name] was doing okay.she continues to have bilateral lower extremity swelling, and chronic tightness.  She denied rest pain or ischemic tissue loss, but noted some claudication.  Denies food fear, weight loss.  Denies stroke, TIA, amaurosis symptoms. Denies abdominal pain, back pain.   The pt is  on a daily aspirin.   Other AC:  plavix The pt is  on medication for hypertension.   The pt is not diabetic.  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 b.) TTE 11/16/2021: EF >55%; mild AS (MPG 9 mmHg)   CAD (coronary artery disease)    a.) s/p 3v CABG 03/29/2000   Cardiac arrest [facility])    Carotid artery stenosis    a.) s/p LEFT CEA 09/09/2003. b.) Carotid doppler 40/98/1191: 1-39% LICA,  CTO RICA; subclavian stenosis   Cataracts, bilateral    Cervical spondylosis without myelopathy    Chronic diastolic CHF (congestive heart failure), NYHA class 3 (HCC)    a.) TTE 05/27/2016: EF >55%, mild LA enlargement, triv PR, mild MR, mod TR; G3DD. b.) TTE 12/12/2017: EF >55%, mild LVH, BAE, mild MR/PR, mod TR; RVSP 52.8 mmHg. c.) TTE 03/18/2020: EF >55%, BAD, AS (MPG 8.7 mmHg); triv MR, mild TR/PR. d.) TTE 11/16/2021: EF >55%, LVH, G1DD, triv MR, mild PR, mod TR; AS (MPG 9 mmHg); MS (MPG 5 mmHg)   Chronic kidney disease, stage III (moderate) (HCC)    Chronic narcotic use 06/24/2014   Chronic pain syndrome    GERD (gastroesophageal reflux disease)    History of 2019 novel coronavirus disease (COVID-19) 09/22/2021   Hyperlipidemia    Hypertension    Long term current use of antithrombotics/antiplatelets    a.) on daily DAPT therapy (ASA + clopidogrel)   Lumbar stenosis with neurogenic claudication    Mitral stenosis 11/16/2021   a.) TTE 11/16/2021: EF >55%; mod MS (MPG 5 mmHg)   Osteoarthritis of hip    Postoperative wound infection 03/29/2022   Pulmonary hypertension (HCC) 12/12/2017   a.) TTE 12/12/2017: mild; RVSP 52.8 mmHg   PVD (peripheral vascular disease) (HCC)    Renal artery stenosis (HCC)    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    Past Surgical History:  Procedure Laterality Date   ABDOMINAL AORTOGRAM W/LOWER EXTREMITY N/A 10/06/2022   Procedure: ABDOMINAL AORTOGRAM W/LOWER EXTREMITY;  Surgeon: Victorino Sparrow, MD;  Location: [facility] INVASIVE CV LAB;  Service: Cardiovascular;  Laterality: N/A;   ABDOMINAL AORTOGRAM W/LOWER EXTREMITY N/A 05/04/2023   Procedure: ABDOMINAL AORTOGRAM W/LOWER EXTREMITY;  Surgeon: Victorino Sparrow, MD;  Location: [facility] INVASIVE CV LAB;   Service: Cardiovascular;  Laterality: N/A;   ABDOMINAL HYSTERECTOMY  1976   CAROTID ARTERY ANGIOPLASTY Left    CAROTID ENDARTERECTOMY Left 09/09/2003   Procedure: CAROTID ENDARTERECTOMY; Location: [facility]; Surgeon: Magda Bernheim, MD   COLONOSCOPY WITH PROPOFOL N/A 08/16/2017   Procedure: COLONOSCOPY WITH PROPOFOL;  Surgeon: Midge Minium, MD;  Location: [facility] ENDOSCOPY;  Service: Endoscopy;  Laterality: N/A;   CORONARY ANGIOPLASTY WITH STENT PLACEMENT  2000   CORONARY ARTERY BYPASS GRAFT N/A 03/29/2000   Procedure: 3v CORONARY ARTERY BYPASS GRAFT; Location: [facility]   CYSTOSCOPY WITH STENT PLACEMENT Bilateral    ENDARTERECTOMY FEMORAL Bilateral 03/17/2022   Procedure: ENDARTERECTOMY FEMORAL ( BILATERAL SFA STENT);  Surgeon: Renford Dills, MD;  Location: [facility] ORS;  Service: Vascular;  Laterality: Bilateral;   ENDARTERECTOMY FEMORAL Right 03/30/2022   Procedure: RE-EXPOSURE OF RIGHT COMMON FEMORAL ARTERY, RE-DO RIGHT FEMORAL ENDARTERECTOMY WITH VEIN PATCH;  Surgeon: Victorino Sparrow, MD;  Location: [facility] OR;  Service: Vascular;  Laterality: Right;  WOUND VAC   ESOPHAGOGASTRODUODENOSCOPY (EGD) WITH PROPOFOL N/A 08/16/2017   Procedure: ESOPHAGOGASTRODUODENOSCOPY (EGD) WITH PROPOFOL;  Surgeon: Midge Minium, MD;  Location: [facility] ENDOSCOPY;  Service: Endoscopy;  Laterality: N/A;   ESOPHAGOGASTRODUODENOSCOPY (EGD) WITH PROPOFOL N/A 06/29/2018   Procedure: ESOPHAGOGASTRODUODENOSCOPY (EGD) WITH PROPOFOL;  Surgeon: Pasty Spillers, MD;  Location: [facility] ENDOSCOPY;  Service: Endoscopy;  Laterality: N/A;   GROIN DEBRIDEMENT Right 03/30/2022   Procedure: RIGHT GROIN IRRIGATION & DEBRIDEMENT;  Surgeon: Victorino Sparrow, MD;  Location: [facility] OR;  Service: Vascular;  Laterality: Right;   INSERTION OF ILIAC STENT Left 03/17/2022   Procedure: INSERTION OF ILIAC STENT;  Surgeon: Renford Dills, MD;  Location: [facility] ORS;  Service: Vascular;  Laterality: Left;   LAPAROSCOPIC CHOLECYSTECTOMY Left 10/26/1999   Procedure:  LAPAROSCOPIC CHOLECYSTECTOMY; Location: [facility]; Surgeon: Renda Rolls, MD   LEFT HEART CATH AND CORONARY ANGIOGRAPHY N/A 11/18/2021   Procedure: LEFT HEART CATH AND CORONARY ANGIOGRAPHY;  Surgeon: Alwyn Pea, MD;  Location: [facility] INVASIVE CV LAB;  Service: Cardiovascular;  Laterality: N/A;   LEFT HEART CATH AND CORS/GRAFTS ANGIOGRAPHY Left 11/19/2002   Procedure: LEFT HEART CATH AND CORS/GRAFTS ANGIOGRAPHY; Location: [facility]; Surgeon: Rudean Hitt, MD   LEFT HEART CATH AND CORS/GRAFTS ANGIOGRAPHY Left 09/17/2003   Procedure: LEFT HEART CATH AND CORS/GRAFTS ANGIOGRAPHY; Location: [facility]; Surgeon: Rudean Hitt, MD   LOWER EXTREMITY ANGIOGRAPHY Right 01/06/2022   Procedure: Lower Extremity Angiography;  Surgeon: Renford Dills, MD;  [facility] INVASIVE CV LAB;  Service: Cardiovascular;  Laterality: Right;   PERIPHERAL VASCULAR BALLOON ANGIOPLASTY  10/06/2022   Procedure: PERIPHERAL VASCULAR BALLOON ANGIOPLASTY;  Surgeon: Victorino Sparrow, MD;  Location: [facility] INVASIVE CV LAB;  Service: Cardiovascular;;  left iliac and bilateral renal   RENAL ARTERY ANGIOPLASTY Bilateral 12/2013   TOE AMPUTATION Right    small toe   TONSILLECTOMY AND ADENOIDECTOMY     TOTAL HIP ARTHROPLASTY Left 2005   TOTAL HIP ARTHROPLASTY Right 2015   UPPER EXTREMITY ANGIOGRAPHY Left 11/23/2021   Procedure: UPPER EXTREMITY ANGIOGRAPHY;  Surgeon: Annice Needy, MD;  Location: [facility] INVASIVE CV LAB;  Service: Cardiovascular;  Laterality: Left;    Social History   Socioeconomic History   Marital status: Divorced    Spouse name: Not on file   Number of children: 3   Years of education: Not on file   Highest education level: Not on file  Occupational History   Occupation: retired  Tobacco Use   Smoking status: Former    Current packs/day: 0.00    Types: Cigarettes    Quit date: 10/25/1998    Years since quitting: 24.9   Smokeless tobacco: Never  Vaping Use   Vaping status: Never Used  Substance and Sexual  Activity   Alcohol use: No    Alcohol/week: 0.0 standard drinks of alcohol   Drug use: Never   Sexual activity: Not Currently  Other Topics Concern   Not on file  Social History Narrative   Daughter Zella Ball ([place]); 1 in [employer]; 1 in [place]   Lives alone   Social Determinants of Health   Financial Resource Strain: Low Risk  (05/10/2023)   Overall Financial Resource Strain (CARDIA)    Difficulty of Paying Living Expenses: Not hard at all  Food Insecurity: No Food Insecurity (05/10/2023)   Hunger Vital Sign    Worried About Running Out of Food in the Last Year: Never true    Ran Out of Food in the Last Year: Never true  Transportation Needs: No Transportation Needs (05/10/2023)   PRAPARE - Administrator, Civil Service (Medical): No    Lack of Transportation (Non-Medical): No  Physical Activity: Inactive (05/10/2023)   Exercise Vital Sign    Days of Exercise per Week: 0 days    Minutes of Exercise per Session: 0 min  Stress: No Stress Concern Present (05/10/2023)   [employer] of Occupational Health - Occupational Stress Questionnaire    Feeling of Stress : Not at all  Social Connections: Moderately Integrated (05/10/2023)   Social Connection and Isolation Panel [NHANES]    Frequency of Communication with Friends and Family: More than three times a week    Frequency of Social Gatherings with Friends and Family: More than three times a week    Attends Religious Services: More than 4 times per year    Active Member of [employer] or Organizations: Yes    Attends Engineer, structural: More than 4 times per year    Marital Status: Divorced  Intimate Partner Violence: Not At Risk (05/10/2023)   Humiliation, Afraid, Rape, and Kick questionnaire    Fear of Current or Ex-Partner: No    Emotionally Abused: No    Physically Abused: No    Sexually Abused: No    Family History  Problem Relation Age of Onset   Stroke Mother    Hypertension Mother    Diabetes  Mother    Hypertension Father    Heart disease Sister        MI   Multiple sclerosis Daughter    Multiple sclerosis Son    Cerebral aneurysm Son    Seizures Son    Cerebral aneurysm Son    Breast cancer Paternal Aunt 35    Current Outpatient Medications  Medication Sig Dispense Refill   amLODipine (NORVASC) 10 MG tablet Take 1 tablet (10 mg total) by mouth at bedtime. 90 tablet 1   aspirin 81 MG tablet Take 81 mg by mouth in the morning.     clopidogrel (PLAVIX) 75 MG  tablet Take 1 tablet (75 mg total) by mouth at bedtime. To prevent strokes 90 tablet 1   DULoxetine (CYMBALTA) 30 MG capsule Take 1 capsule (30 mg total) by mouth daily. 90 capsule 1   ezetimibe (ZETIA) 10 MG tablet Take 1 tablet (10 mg total) by mouth at bedtime. 90 tablet 3   ferrous sulfate 325 (65 FE) MG EC tablet Take 1 tablet (325 mg total) by mouth daily.     furosemide (LASIX) 20 MG tablet Take 1 tablet (20 mg total) by mouth daily. 30 tablet 3   hydrALAZINE (APRESOLINE) 50 MG tablet TAKE 1 TABLET BY MOUTH THREE TIMES DAILY AS NEEDED FOR BLOOD PRESSURE GREATER THAN 150 270 tablet 0   labetalol (NORMODYNE) 300 MG tablet Take 1 tablet (300 mg total) by mouth 2 (two) times daily. For hypertension 180 tablet 1   losartan (COZAAR) 50 MG tablet Take 1 tablet (50 mg total) by mouth at bedtime. 90 tablet 1   nortriptyline (PAMELOR) 10 MG capsule Take 1 capsule (10 mg total) by mouth 2 (two) times daily. For chronic headaches 180 capsule 3   pantoprazole (PROTONIX) 40 MG tablet One tablet upon waking,  take 30 minuts prior to eating 90 tablet 3   pregabalin (LYRICA) 25 MG capsule Take 1 capsule (25 mg total) by mouth at bedtime for 15 days, THEN 2 capsules (50 mg total) at bedtime. 105 capsule 0   ranolazine (RANEXA) 500 MG 12 hr tablet Take 1 tablet (500 mg total) by mouth 2 (two) times daily. 60 tablet 3   triamcinolone cream (KENALOG) 0.1 % Apply 1 Application topically 2 (two) times daily. To all areas of rash 453.6 g 0    vitamin C (ASCORBIC ACID) 250 MG tablet Take 1 tablet (250 mg total) by mouth daily. 90 tablet 1   No current facility-administered medications for this visit.    Allergies  Allergen Reactions   Celexa [Citalopram] Anaphylaxis    Throat closing    Dilaudid [Hydromorphone Hcl] Nausea And Vomiting   Effient [Prasugrel] Itching   Hydrochlorothiazide Other (See Comments)    Decreased GFR (Nov 2015)   Liothyronine     Hair fell out, caused headaches    Nsaids     CKD stage III - avoid nephrotoxic drugs   Nubain [Nalbuphine Hcl]     Burning sensation in back   Penicillins Itching   Statins Itching     REVIEW OF SYSTEMS:   [X]  denotes positive finding, [ ]  denotes negative finding Cardiac  Comments:  Chest pain or chest pressure:    Shortness of breath upon exertion:    Short of breath when lying flat:    Irregular heart rhythm:        Vascular    Pain in calf, thigh, or hip brought on by ambulation:    Pain in feet at night that wakes you up from your sleep:     Blood clot in your veins:    Leg swelling:         Pulmonary    Oxygen at home:    Productive cough:     Wheezing:         Neurologic    Sudden weakness in arms or legs:     Sudden numbness in arms or legs:     Sudden onset of difficulty speaking or slurred speech:    Temporary loss of vision in one eye:     Problems with dizziness:  Gastrointestinal    Blood in stool:     Vomited blood:         Genitourinary    Burning when urinating:     Blood in urine:        Psychiatric    Major depression:         Hematologic    Bleeding problems:    Problems with blood clotting too easily:        Skin    Rashes or ulcers:        Constitutional    Fever or chills:      PHYSICAL EXAMINATION:  There were no vitals filed for this visit.   General:  WDWN in NAD; vital signs documented above Gait: Not observed HENT: WNL, normocephalic Pulmonary: normal non-labored breathing , without  wheezing Cardiac: regular HR, Abdomen: soft, NT, no masses Skin: without rashes Vascular Exam/Pulses:  Right Left  Radial 2+ (normal) 2+ (normal)  Ulnar 2+ (normal) 2+ (normal)  Femoral    Popliteal    DP nonpalpable nonpalpable  PT nonpalpable nonpalpable   Extremities: without ischemic changes, without Gangrene , without cellulitis; without open wounds;  Right groin wound with 3 small areas opened, probe less than 1 cm, Musculoskeletal: no muscle wasting or atrophy  Neurologic: A&O X 3;  No focal weakness or paresthesias are detected Psychiatric:  The pt has Normal affect.   Non-Invasive Vascular Imaging:    Abdominal Aorta Findings:  +-------------+-------+----------+----------+----------+--------+----------  ----+  Location    AP (cm)Trans (cm)PSV (cm/s)Waveform  ThrombusComments         +-------------+-------+----------+----------+----------+--------+----------  ----+  Proximal                     79                                           +-------------+-------+----------+----------+----------+--------+----------  ----+  Mid                          114                                          +-------------+-------+----------+----------+----------+--------+----------  ----+  Distal                       132                                          +-------------+-------+----------+----------+----------+--------+----------  ----+  RT CIA Prox                   167       monophasic                         +-------------+-------+----------+----------+----------+--------+----------  ----+  RT CIA Mid                    201       monophasic        brisk            +-------------+-------+----------+----------+----------+--------+----------  ----+  RT EIA Mid  255       monophasic        brisk            +-------------+-------+----------+----------+----------+--------+----------  ----+   RT EIA Distal                 391       monophasic        brisk ,  no                                                                plaque                                                                      visualized      +-------------+-------+----------+----------+----------+--------+----------  ----+  LT CIA Prox                   316       monophasic        brisk ,                                                                    tortuous,  NWV   +-------------+-------+----------+----------+----------+--------+----------  ----+  LT CIA Distal                 234       monophasic        brisk ,  curve   +-------------+-------+----------+----------+----------+--------+----------  ----+  LT EIA Prox                   263       monophasic        brisk .  NWV     +-------------+-------+----------+----------+----------+--------+----------  ----+  LT EIA Mid                    247       biphasic                           +-------------+-------+----------+----------+----------+--------+----------  ----+  LT EIA Distal                 215       monophasic        brisk,  curve    +-------------+-------+----------+----------+----------+--------+----------   ABI Findings:  +---------+------------------+-----+----------+--------+  Right   Rt Pressure (mmHg)IndexWaveform  Comment   +---------+------------------+-----+----------+--------+  Brachial 170                                        +---------+------------------+-----+----------+--------+  PTA     96  0.51 monophasic          +---------+------------------+-----+----------+--------+  DP      100               0.53 monophasic          +---------+------------------+-----+----------+--------+  Great Toe79                0.42 Abnormal            +---------+------------------+-----+----------+--------+    +---------+------------------+-----+----------+-------+  Left    Lt Pressure (mmHg)IndexWaveform  Comment  +---------+------------------+-----+----------+-------+  Brachial 189                                       +---------+------------------+-----+----------+-------+  PTA     122               0.65 monophasic         +---------+------------------+-----+----------+-------+  DP      114               0.60 biphasic           +---------+------------------+-----+----------+-------+  Great Toe82                0.43 Abnormal           +---------+------------------+-----+----------+-------+   +-------+-----------+-----------+------------+------------+  ABI/TBIToday's ABIToday's TBIPrevious ABIPrevious TBI  +-------+-----------+-----------+------------+------------+  Right 0.53       0.42       0.53        0.38          +-------+-----------+-----------+------------+------------+  Left  0.65       0.43       0.60        0.40          +-------+-----------+-----------+------------+------------+    Right Carotid Findings:  +----------+--------+--------+--------+------------------+--------+           PSV cm/sEDV cm/sStenosisPlaque DescriptionComments  +----------+--------+--------+--------+------------------+--------+  CCA Prox  69      1               heterogenous                +----------+--------+--------+--------+------------------+--------+  CCA Mid   62      2               heterogenous                +----------+--------+--------+--------+------------------+--------+  CCA Distal21      1               heterogenous                +----------+--------+--------+--------+------------------+--------+  ICA Prox                  Occluded                            +----------+--------+--------+--------+------------------+--------+  ICA Mid                   Occluded                             +----------+--------+--------+--------+------------------+--------+  ICA Distal                Occluded                            +----------+--------+--------+--------+------------------+--------+  ECA      104     17                                          +----------+--------+--------+--------+------------------+--------+   +----------+--------+-------+--------+-------------------+           PSV cm/sEDV cmsDescribeArm Pressure (mmHG)  +----------+--------+-------+--------+-------------------+  [id number]    12     Stenotic170                  +----------+--------+-------+--------+-------------------+   +---------+--------+--+--------+--+---------+  VertebralPSV cm/s66EDV cm/s14Antegrade  +---------+--------+--+--------+--+---------+      Left Carotid Findings:  +----------+--------+--------+--------+------------------+--------+           PSV cm/sEDV cm/sStenosisPlaque DescriptionComments  +----------+--------+--------+--------+------------------+--------+  CCA Prox  157     17                                          +----------+--------+--------+--------+------------------+--------+  CCA Mid   129     21                                          +----------+--------+--------+--------+------------------+--------+  CCA Distal86      19              heterogenous                +----------+--------+--------+--------+------------------+--------+  ICA Prox  98      32      1-39%   heterogenous                +----------+--------+--------+--------+------------------+--------+  ICA Mid   86      26                                          +----------+--------+--------+--------+------------------+--------+  ICA Distal91      28                                          +----------+--------+--------+--------+------------------+--------+  ECA      109     15              calcific                     +----------+--------+--------+--------+------------------+--------+   +----------+--------+--------+--------+-------------------+           PSV cm/sEDV cm/sDescribeArm Pressure (mmHG)  +----------+--------+--------+--------+-------------------+  [id number]    7       Stenotic189                  +----------+--------+--------+--------+-------------------+   +---------+--------+--+--------+-+---------+  VertebralPSV cm/s33EDV cm/s9Antegrade  +---------+--------+--+--------+-+---------+    ASSESSMENT/PLAN: Sabrina [name] is a 82 y.o. female presenting in follow up s/p right groin exploration, redo CFA patch plasty with ipsilateral GSV.  In December 2023 she underwent right-sided EIA drug-coated balloon angioplasty, bilateral renal artery angioplasty for assisted primary patency of bilateral renal artery stents.  Last imaging occurred 04/2023 due to concern for threatened stents.  Imaging demonstrated widely patent iliac stents  bilaterally.  Imaging studies were reviewed.  Iliac velocities are elevated, but stable.  ABIs stable, carotid disease stable.  At this point, my plan is to follow [place] on a 26-month basis.  At her next visit, we will also need to insonate her bilateral renal artery stents.     Victorino Sparrow, MD [facility] [phone]  Total time of patient care including pre-visit research, consultation, and documentation greater than 40 minutes

## 2023-09-15 ENCOUNTER — Encounter: Payer: Self-pay | Admitting: Vascular Surgery

## 2023-09-15 ENCOUNTER — Ambulatory Visit (INDEPENDENT_AMBULATORY_CARE_PROVIDER_SITE_OTHER): Payer: Medicare Other | Admitting: Vascular Surgery

## 2023-09-15 VITALS — BP 136/71 | HR 62 | Temp 98.1°F | Resp 20 | Ht 60.0 in | Wt 169.0 lb

## 2023-09-15 DIAGNOSIS — I739 Peripheral vascular disease, unspecified: Secondary | ICD-10-CM

## 2023-09-15 DIAGNOSIS — I6522 Occlusion and stenosis of left carotid artery: Secondary | ICD-10-CM

## 2023-09-15 DIAGNOSIS — Z95828 Presence of other vascular implants and grafts: Secondary | ICD-10-CM

## 2023-09-15 DIAGNOSIS — I701 Atherosclerosis of renal artery: Secondary | ICD-10-CM | POA: Diagnosis not present

## 2023-09-15 DIAGNOSIS — I771 Stricture of artery: Secondary | ICD-10-CM

## 2023-09-15 DIAGNOSIS — I6521 Occlusion and stenosis of right carotid artery: Secondary | ICD-10-CM

## 2023-09-15 DIAGNOSIS — Z9889 Other specified postprocedural states: Secondary | ICD-10-CM | POA: Diagnosis not present

## 2023-09-20 ENCOUNTER — Other Ambulatory Visit: Payer: Self-pay

## 2023-09-20 DIAGNOSIS — I6523 Occlusion and stenosis of bilateral carotid arteries: Secondary | ICD-10-CM

## 2023-09-20 DIAGNOSIS — I739 Peripheral vascular disease, unspecified: Secondary | ICD-10-CM

## 2023-09-20 DIAGNOSIS — I701 Atherosclerosis of renal artery: Secondary | ICD-10-CM

## 2023-09-20 DIAGNOSIS — I7143 Infrarenal abdominal aortic aneurysm, without rupture: Secondary | ICD-10-CM

## 2023-09-20 DIAGNOSIS — Z95828 Presence of other vascular implants and grafts: Secondary | ICD-10-CM

## 2023-09-25 ENCOUNTER — Encounter: Payer: Self-pay | Admitting: Internal Medicine

## 2023-09-25 DIAGNOSIS — I10 Essential (primary) hypertension: Secondary | ICD-10-CM

## 2023-09-26 MED ORDER — LABETALOL HCL 300 MG PO TABS
300.0000 mg | ORAL_TABLET | Freq: Two times a day (BID) | ORAL | 1 refills | Status: DC
Start: 2023-09-26 — End: 2024-03-27

## 2023-09-28 ENCOUNTER — Ambulatory Visit: Payer: Medicare Other | Admitting: Internal Medicine

## 2023-09-28 ENCOUNTER — Encounter: Payer: Self-pay | Admitting: Internal Medicine

## 2023-09-28 VITALS — BP 128/64 | HR 76 | Ht 60.0 in | Wt 167.2 lb

## 2023-09-28 DIAGNOSIS — I739 Peripheral vascular disease, unspecified: Secondary | ICD-10-CM

## 2023-09-28 DIAGNOSIS — N183 Chronic kidney disease, stage 3 unspecified: Secondary | ICD-10-CM

## 2023-09-28 DIAGNOSIS — J069 Acute upper respiratory infection, unspecified: Secondary | ICD-10-CM

## 2023-09-28 DIAGNOSIS — I130 Hypertensive heart and chronic kidney disease with heart failure and stage 1 through stage 4 chronic kidney disease, or unspecified chronic kidney disease: Secondary | ICD-10-CM | POA: Diagnosis not present

## 2023-09-28 MED ORDER — GUAIFENESIN-CODEINE 100-10 MG/5ML PO SOLN: 5.0000 mL | Freq: Three times a day (TID) | ORAL | 0 refills | Status: DC | PRN

## 2023-09-28 MED ORDER — PREDNISONE 10 MG PO TABS: ORAL_TABLET | ORAL | 0 refills | Status: DC

## 2023-09-28 MED ORDER — AZELASTINE HCL 0.1 % NA SOLN: 1.0000 | Freq: Two times a day (BID) | NASAL | 12 refills | Status: DC

## 2023-09-28 NOTE — Progress Notes (Unsigned)
Subjective:  Patient ID: Fernande A Hubbs, female    DOB: 1941/02/06  Age: 82 y.o. MRN: [id number]  CC: {There were no encounter diagnoses. (Refresh or delete this SmartLink)}   HPI Ixchel A Piper presents for  Chief Complaint  Patient presents with   Cough    Persistent cough for the past 3 days,  accompanied by rhinitis.  The cough is productive of clear sputum.  keeping her up at night   Outpatient Medications Prior to Visit  Medication Sig Dispense Refill   amLODipine (NORVASC) 10 MG tablet Take 1 tablet (10 mg total) by mouth at bedtime. 90 tablet 1   aspirin 81 MG tablet Take 81 mg by mouth in the morning.     clopidogrel (PLAVIX) 75 MG tablet Take 1 tablet (75 mg total) by mouth at bedtime. To prevent strokes 90 tablet 1   DULoxetine (CYMBALTA) 30 MG capsule Take 1 capsule (30 mg total) by mouth daily. 90 capsule 1   ezetimibe (ZETIA) 10 MG tablet Take 1 tablet (10 mg total) by mouth at bedtime. 90 tablet 3   ferrous sulfate 325 (65 FE) MG EC tablet Take 1 tablet (325 mg total) by mouth daily.     furosemide (LASIX) 20 MG tablet Take 1 tablet (20 mg total) by mouth daily. 30 tablet 3   hydrALAZINE (APRESOLINE) 50 MG tablet TAKE 1 TABLET BY MOUTH THREE TIMES DAILY AS NEEDED FOR BLOOD PRESSURE GREATER THAN 150 270 tablet 0   labetalol (NORMODYNE) 300 MG tablet Take 1 tablet (300 mg total) by mouth 2 (two) times daily. For hypertension 180 tablet 1   losartan (COZAAR) 50 MG tablet Take 1 tablet (50 mg total) by mouth at bedtime. 90 tablet 1   nortriptyline (PAMELOR) 10 MG capsule Take 1 capsule (10 mg total) by mouth 2 (two) times daily. For chronic headaches 180 capsule 3   pantoprazole (PROTONIX) 40 MG tablet One tablet upon waking,  take 30 minuts prior to eating 90 tablet 3   pregabalin (LYRICA) 25 MG capsule Take 1 capsule (25 mg total) by mouth at bedtime for 15 days, THEN 2 capsules (50 mg total) at bedtime. 105 capsule 0   ranolazine (RANEXA) 500 MG 12 hr tablet Take  1 tablet (500 mg total) by mouth 2 (two) times daily. 60 tablet 3   triamcinolone cream (KENALOG) 0.1 % Apply 1 Application topically 2 (two) times daily. To all areas of rash 453.6 g 0   vitamin C (ASCORBIC ACID) 250 MG tablet Take 1 tablet (250 mg total) by mouth daily. 90 tablet 1   No facility-administered medications prior to visit.    Review of Systems;  Patient denies headache, fevers, malaise, unintentional weight loss, skin rash, eye pain, sinus congestion and sinus pain, sore throat, dysphagia,  hemoptysis , cough, dyspnea, wheezing, chest pain, palpitations, orthopnea, edema, abdominal pain, nausea, melena, diarrhea, constipation, flank pain, dysuria, hematuria, urinary  Frequency, nocturia, numbness, tingling, seizures,  Focal weakness, Loss of consciousness,  Tremor, insomnia, depression, anxiety, and suicidal ideation.      Objective:  BP 128/64   Pulse 76   Ht 5' (1.524 m)   Wt 167 lb 3.2 oz (75.8 kg)   SpO2 96%   BMI 32.65 kg/m   BP Readings from Last 3 Encounters:  09/28/23 128/64  09/15/23 136/71  09/13/23 (!) 162/67    Wt Readings from Last 3 Encounters:  09/28/23 167 lb 3.2 oz (75.8 kg)  09/15/23 169 lb (76.7 kg)  09/13/23 167 lb (75.8 kg)    Physical Exam  Lab Results  Component Value Date   HGBA1C 5.9 07/19/2016   HGBA1C 5.8 06/02/2015    Lab Results  Component Value Date   CREATININE 1.50 (H) 08/25/2023   CREATININE 1.70 (H) 05/04/2023   CREATININE 1.70 (H) 04/26/2023    Lab Results  Component Value Date   WBC 5.3 07/27/2023   HGB 10.0 (L) 07/27/2023   HCT 30.5 (L) 07/27/2023   PLT 268 07/27/2023   GLUCOSE 90 08/25/2023   CHOL 187 04/26/2023   TRIG 51.0 04/26/2023   HDL 61.00 04/26/2023   LDLDIRECT 107.0 04/26/2023   LDLCALC 116 (H) 04/26/2023   ALT 10 04/26/2023   AST 14 04/26/2023   NA 140 08/25/2023   K 3.9 08/25/2023   CL 106 08/25/2023   CREATININE 1.50 (H) 08/25/2023   BUN 23 08/25/2023   CO2 25 08/25/2023   TSH 4.68  04/13/2023   INR 1.3 (H) 03/31/2022   HGBA1C 5.9 07/19/2016    VAS US AORTA/IVC/ILIACS  Result Date: 08/05/2023 ABDOMINAL AORTA STUDY Patient Name:  Harriet A Meek  Date of Exam:   08/05/2023 Medical Rec #: [id number]           Accession #:    [id number] Date of Birth: [birth year]           Patient Gender: F Patient Age:   63 years Exam Location:  [facility] Procedure:      VAS US AORTA/IVC/ILIACS Referring Phys: Ivin Booty ROBINS --------------------------------------------------------------------------------  Indications: Stent evaluations Risk Factors: Hypertension, hyperlipidemia, coronary artery disease. Other Factors: Aortogram 05/04/2023 revealed widely patent stents in the common                Iliac arteries bilaterally. Widely patent stents in the external                iliac arteries bilaterall. Vascular Interventions: 10/06/2022 Right external iliac drug-coated balloon                         angioplasty 6 x 40 mm . Bilateral selective renal artery                         angiogram. Left renal artery balloon angioplasty 6 x 20                         mm. Right renal artery angioplasty 4 x 20 mm                          03/17/22 left CFA, PFA, SFA endarterectomies, right CFA,                         PFA, SFA endarterectomies, left EIA stent with 8mm x 4cm                         life star stent. Limitations: Air/bowel gas, obesity and patient discomfort. Breathing artifact.  Performing Technologist: Elita Quick RVT  Examination Guidelines: A complete evaluation includes B-mode imaging, spectral Doppler, color Doppler, and power Doppler as needed of all accessible portions of each vessel. Bilateral testing is considered an integral part of a complete examination. Limited examinations for reoccurring indications may be performed as noted.  Abdominal Aorta Findings: +-------------+-------+----------+----------+----------+--------+--------------+ Location  AP (cm)Trans (cm)PSV  (cm/s)Waveform  ThrombusComments       +-------------+-------+----------+----------+----------+--------+--------------+ Proximal                      79                                         +-------------+-------+----------+----------+----------+--------+--------------+ Mid                           114                                        +-------------+-------+----------+----------+----------+--------+--------------+ Distal                        132                                        +-------------+-------+----------+----------+----------+--------+--------------+ RT CIA Prox                   167       monophasic                       +-------------+-------+----------+----------+----------+--------+--------------+ RT CIA Mid                    201       monophasic        brisk          +-------------+-------+----------+----------+----------+--------+--------------+ RT EIA Mid                    255       monophasic        brisk          +-------------+-------+----------+----------+----------+--------+--------------+ RT EIA Distal                 391       monophasic        brisk , no                                                               plaque                                                                   visualized     +-------------+-------+----------+----------+----------+--------+--------------+ LT CIA Prox                   316       monophasic        brisk ,  tortuous, NWV  +-------------+-------+----------+----------+----------+--------+--------------+ LT CIA Distal                 234       monophasic        brisk , curve  +-------------+-------+----------+----------+----------+--------+--------------+ LT EIA Prox                   263       monophasic        brisk . NWV     +-------------+-------+----------+----------+----------+--------+--------------+ LT EIA Mid                    247       biphasic                         +-------------+-------+----------+----------+----------+--------+--------------+ LT EIA Distal                 215       monophasic        brisk, curve   +-------------+-------+----------+----------+----------+--------+--------------+  Summary: Stenosis: +--------------------+-------------+ Location            Stenosis      +--------------------+-------------+ Right Common Iliac  >50% stenosis +--------------------+-------------+ Left Common Iliac   >50% stenosis +--------------------+-------------+ Right External Iliac>50% stenosis +--------------------+-------------+ Left External Iliac >50% stenosis +--------------------+-------------+  IVC/Iliac: Suboptimal, limited exam. Unable to visualize stents. Further imaging modalities may be waranted in the future.  *See table(s) above for measurements and observations.  Electronically signed by Carolynn Sayers on 08/05/2023 at 12:29:21 PM.    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 Other Factors:     Left Lifestream stent to left subclavian 11/23/2021. History                    of left carotid endarterectomy at [facility] Comparison Study:  No significant change since prior exam of  04/22/2023. Performing Technologist: Elita Quick RVT  Examination Guidelines: A complete evaluation includes B-mode imaging, spectral Doppler, color Doppler, and power Doppler as needed of all accessible portions of each vessel. Bilateral testing is considered an integral part of a complete examination. Limited examinations for reoccurring indications may be performed as noted.  Right Carotid Findings: +----------+--------+--------+--------+------------------+--------+           PSV cm/sEDV cm/sStenosisPlaque DescriptionComments +----------+--------+--------+--------+------------------+--------+ CCA Prox  69      1               heterogenous               +----------+--------+--------+--------+------------------+--------+ CCA Mid   62      2               heterogenous               +----------+--------+--------+--------+------------------+--------+  CCA Distal21      1               heterogenous               +----------+--------+--------+--------+------------------+--------+ ICA Prox                  Occluded                           +----------+--------+--------+--------+------------------+--------+ ICA Mid                   Occluded                           +----------+--------+--------+--------+------------------+--------+ ICA Distal                Occluded                           +----------+--------+--------+--------+------------------+--------+ ECA       104     17                                         +----------+--------+--------+--------+------------------+--------+ +----------+--------+-------+--------+-------------------+           PSV cm/sEDV cmsDescribeArm Pressure (mmHG) +----------+--------+-------+--------+-------------------+ [id number]     12     Stenotic170                 +----------+--------+-------+--------+-------------------+ +---------+--------+--+--------+--+---------+ VertebralPSV cm/s66EDV cm/s14Antegrade  +---------+--------+--+--------+--+---------+  Left Carotid Findings: +----------+--------+--------+--------+------------------+--------+           PSV cm/sEDV cm/sStenosisPlaque DescriptionComments +----------+--------+--------+--------+------------------+--------+ CCA Prox  157     17                                         +----------+--------+--------+--------+------------------+--------+ CCA Mid   129     21                                         +----------+--------+--------+--------+------------------+--------+ CCA Distal86      19              heterogenous               +----------+--------+--------+--------+------------------+--------+ ICA Prox  98      32      1-39%   heterogenous               +----------+--------+--------+--------+------------------+--------+ ICA Mid   86      26                                         +----------+--------+--------+--------+------------------+--------+ ICA Distal91      28                                         +----------+--------+--------+--------+------------------+--------+ ECA       109     15  calcific                   +----------+--------+--------+--------+------------------+--------+ +----------+--------+--------+--------+-------------------+           PSV cm/sEDV cm/sDescribeArm Pressure (mmHG) +----------+--------+--------+--------+-------------------+ [id number]     7       Stenotic189                 +----------+--------+--------+--------+-------------------+ +---------+--------+--+--------+-+---------+ VertebralPSV cm/s33EDV cm/s9Antegrade +---------+--------+--+--------+-+---------+   Summary: Right Carotid: Evidence consistent with a total occlusion of the right ICA. Left Carotid: Velocities in the left ICA are consistent with a 1-39% stenosis.               Unable to visualized subclavian stent. Vertebrals:  Bilateral vertebral arteries demonstrate antegrade flow.  Subclavians: Bilateral subclavian arteries were stenotic. *See table(s) above for measurements and observations.  Electronically signed by Carolynn Sayers on 08/05/2023 at 12:28:20 PM.    Final    VAS [place] ABI WITH/WO TBI  Result Date: 08/05/2023  LOWER EXTREMITY DOPPLER STUDY Patient Name:  Khalani A Mollenkopf  Date of Exam:   08/05/2023 Medical Rec #: [id number]           Accession #:    [id number] Date of Birth: August 10, 1941           Patient Gender: F Patient Age:   24 years Exam Location:  [facility] Procedure:      VAS [place] ABI WITH/WO TBI Referring Phys: JOSHUA ROBINS --------------------------------------------------------------------------------  Indications: Claudication, and peripheral artery disease. High Risk Factors: Hypertension, hyperlipidemia, past history of smoking,                    coronary artery disease.  Vascular Interventions: 10/06/2022 Right external iliac drug-coated balloon                         angioplasty 6 x 40 mm . Bilateral selective renal artery                         angiogram. Left renal artery balloon angioplasty 6 x 20                         mm. Right renal artery angioplasty 4 x 20 mm                          03/17/22 left CFA, PFA, SFA endarterectomies, right CFA,                         PFA, SFA endarterectomies, left EIA stent with 8mm x 4cm                         life star stent. Performing Technologist: Elita Quick RVT  Examination Guidelines: A complete evaluation includes at minimum, Doppler waveform signals and systolic blood pressure reading at the level of bilateral brachial, anterior tibial, and posterior tibial arteries, when vessel segments are accessible. Bilateral testing is considered an integral part of a complete examination. Photoelectric Plethysmograph (PPG) waveforms and toe systolic pressure readings are included as required and additional duplex testing as needed. Limited examinations for reoccurring indications may be performed as  noted.  ABI Findings: +---------+------------------+-----+----------+--------+ Right    Rt Pressure (mmHg)IndexWaveform  Comment  +---------+------------------+-----+----------+--------+ Brachial 170                                       +---------+------------------+-----+----------+--------+  PTA      96                0.51 monophasic         +---------+------------------+-----+----------+--------+ DP       100               0.53 monophasic         +---------+------------------+-----+----------+--------+ Great Toe79                0.42 Abnormal           +---------+------------------+-----+----------+--------+ +---------+------------------+-----+----------+-------+ Left     Lt Pressure (mmHg)IndexWaveform  Comment +---------+------------------+-----+----------+-------+ Brachial 189                                      +---------+------------------+-----+----------+-------+ PTA      122               0.65 monophasic        +---------+------------------+-----+----------+-------+ DP       114               0.60 biphasic          +---------+------------------+-----+----------+-------+ Great Toe82                0.43 Abnormal          +---------+------------------+-----+----------+-------+ +-------+-----------+-----------+------------+------------+ ABI/TBIToday's ABIToday's TBIPrevious ABIPrevious TBI +-------+-----------+-----------+------------+------------+ Right  0.53       0.42       0.53        0.38         +-------+-----------+-----------+------------+------------+ Left   0.65       0.43       0.60        0.40         +-------+-----------+-----------+------------+------------+  Bilateral ABIs and TBIs appear essentially unchanged.  Summary: Right: Resting right ankle-brachial index indicates moderate right lower extremity arterial disease. The right toe-brachial index is abnormal. Left: Resting left ankle-brachial index indicates  moderate left lower extremity arterial disease. The left toe-brachial index is abnormal. *See table(s) above for measurements and observations.  Electronically signed by Carolynn Sayers on 08/05/2023 at 12:17:22 PM.    Final     Assessment & Plan:  .There are no diagnoses linked to this encounter.   I provided 30 minutes of face-to-face time during this encounter reviewing patient's last visit with me, patient's  most recent visit with cardiology,  nephrology,  and neurology,  recent surgical and non surgical procedures, previous  labs and imaging studies, counseling on currently addressed issues,  and post visit ordering to diagnostics and therapeutics .   Follow-up: No follow-ups on file.   Sherlene Shams, MD

## 2023-09-28 NOTE — Patient Instructions (Signed)
You have a viral  respiratory infection which is causing bronchitis.    The post nasal drip may also be contributing to  your  Cough.  I am prescribing a prednisone  taper  ,  and allergy nasal spray and a cough medicine with codeine in it to control the cough   Your coughing may last 7 to 10 days and you  may develop a low grade fever ( T > 100.4,  less than 102) but if you develop Green nasal discharge,  Ear  Or facial pain, call and I will add an antibiotic

## 2023-09-29 NOTE — Assessment & Plan Note (Signed)
Well controlled on current regimen. Renal function stable, no changes today. 

## 2023-09-29 NOTE — Assessment & Plan Note (Signed)
Encouraged to use topical decongestants,  topical antihistamine and cough supression with cheratussin and prednisone taper.  Advised to call clinic if fevers develop of sputum  becomes purulent and I will add empiric abx

## 2023-09-29 NOTE — Assessment & Plan Note (Addendum)
She underwent aortogram and balloon  angioplasty on Oct 08 2022 :   Right external iliac drug-coated balloon angioplasty 6 x 40 mm  4.  Bilateral selective renal artery angiogram  5.  Left renal artery balloon angioplasty 6 x 20 mm  6.  Right renal artery angioplasty 4 x 20 mm

## 2023-10-04 ENCOUNTER — Telehealth: Payer: Self-pay

## 2023-10-04 DIAGNOSIS — J069 Acute upper respiratory infection, unspecified: Secondary | ICD-10-CM

## 2023-10-04 MED ORDER — HYDROCOD POLI-CHLORPHE POLI ER 10-8 MG/5ML PO SUER: 5.0000 mL | Freq: Two times a day (BID) | ORAL | 0 refills | Status: DC | PRN

## 2023-10-04 NOTE — Telephone Encounter (Signed)
Tussionex does not appear to be covered by her insurance,  just warn her,  but I sent it as requested  Given the length of her illness, I would like her to get a chest x ray at the office and have ordered it

## 2023-10-04 NOTE — Telephone Encounter (Signed)
Spoke with pt to let her know that the tussinex has been sent in to pharmacy but that it may not be covered by insurance. Pt is already scheduled for the chest xray for tomorrow at 2:30.

## 2023-10-04 NOTE — Telephone Encounter (Signed)
Patient states the cough medicine is not helping her at all.  Patient states she is not able to sleep because she is coughing.  Patient states she would like for Dr. Duncan Dull, to please prescribe Tussionex cough syrup for her.  Patient states her preferred pharmacy is [facility] [address] in [place].

## 2023-10-04 NOTE — Addendum Note (Signed)
Addended by: Sherlene Shams on: 10/04/2023 01:36 PM   Modules accepted: Orders

## 2023-10-05 ENCOUNTER — Ambulatory Visit: Payer: Medicare Other

## 2023-10-05 ENCOUNTER — Other Ambulatory Visit: Payer: Self-pay | Admitting: *Deleted

## 2023-10-05 ENCOUNTER — Other Ambulatory Visit: Payer: Medicare Other

## 2023-10-05 DIAGNOSIS — J069 Acute upper respiratory infection, unspecified: Secondary | ICD-10-CM | POA: Diagnosis not present

## 2023-10-05 DIAGNOSIS — I10 Essential (primary) hypertension: Secondary | ICD-10-CM

## 2023-10-05 DIAGNOSIS — K219 Gastro-esophageal reflux disease without esophagitis: Secondary | ICD-10-CM

## 2023-10-05 DIAGNOSIS — R059 Cough, unspecified: Secondary | ICD-10-CM | POA: Diagnosis not present

## 2023-10-05 DIAGNOSIS — M47816 Spondylosis without myelopathy or radiculopathy, lumbar region: Secondary | ICD-10-CM

## 2023-10-05 DIAGNOSIS — I130 Hypertensive heart and chronic kidney disease with heart failure and stage 1 through stage 4 chronic kidney disease, or unspecified chronic kidney disease: Secondary | ICD-10-CM

## 2023-10-06 MED ORDER — DULOXETINE HCL 30 MG PO CPEP: 30.0000 mg | ORAL_CAPSULE | Freq: Every day | ORAL | 1 refills | Status: DC

## 2023-10-06 MED ORDER — AMLODIPINE BESYLATE 10 MG PO TABS: 10.0000 mg | ORAL_TABLET | Freq: Every day | ORAL | 1 refills | Status: DC

## 2023-10-06 MED ORDER — EZETIMIBE 10 MG PO TABS: 10.0000 mg | ORAL_TABLET | Freq: Every day | ORAL | 3 refills | Status: DC

## 2023-10-06 MED ORDER — CLOPIDOGREL BISULFATE 75 MG PO TABS: 75.0000 mg | ORAL_TABLET | Freq: Every day | ORAL | 1 refills | Status: DC

## 2023-10-28 ENCOUNTER — Encounter: Payer: Self-pay | Admitting: Nurse Practitioner

## 2023-10-28 ENCOUNTER — Ambulatory Visit (INDEPENDENT_AMBULATORY_CARE_PROVIDER_SITE_OTHER): Payer: Medicare Other | Admitting: Nurse Practitioner

## 2023-10-28 VITALS — BP 116/66 | HR 61 | Temp 98.0°F | Resp 18 | Ht 60.0 in | Wt 165.4 lb

## 2023-10-28 DIAGNOSIS — R058 Other specified cough: Secondary | ICD-10-CM

## 2023-10-28 DIAGNOSIS — R42 Dizziness and giddiness: Secondary | ICD-10-CM | POA: Diagnosis not present

## 2023-10-28 DIAGNOSIS — R5383 Other fatigue: Secondary | ICD-10-CM

## 2023-10-28 MED ORDER — GUAIFENESIN ER 600 MG PO TB12: 600.0000 mg | ORAL_TABLET | Freq: Two times a day (BID) | ORAL | 0 refills | Status: DC

## 2023-10-28 MED ORDER — DOXYCYCLINE HYCLATE 100 MG PO TABS: 100.0000 mg | ORAL_TABLET | Freq: Two times a day (BID) | ORAL | 0 refills | Status: AC

## 2023-10-28 NOTE — Patient Instructions (Signed)
 Take antibiotic (doxcycline), mucinex tablet and cough suppressant. We will check labs. Increase fluid intake.

## 2023-10-28 NOTE — Progress Notes (Signed)
 Established Patient Office Visit  Subjective:  Patient ID: Sabrina [name], female    DOB: 16-Oct-1941  Age: 83 y.o. MRN: [id number]  CC:  Chief Complaint  Patient presents with   Cough   Dizziness   Fatigue    Since Dec 10     HPI  [name] presents for  persistent cough that started before December 12th.  The patient had a ED visit on 09/28/2023 and treated with azelastine , prednisone  and virtusssin despite treatment, the cough has not resolved.  The chest x-ray on 12/11 showed no sign of pneumonia. The cough is particularly severe at night, disrupting the patient's sleep. The patient denies fever, wheezing, and ear pain but reports occasional nasal congestion and postnasal drip, especially when lying down. The patient also reports fatigue, which may be related to the ongoing cough or other underlying conditions.  In addition to the cough, the patient reports intermittent dizziness when changing position.  She denies any dizziness at this present, sensation of room spinning, visual changes, weakness of one side.  The patient has been taking iron supplements due to a low hemoglobin level detected three months ago. The patient's fatigue may be related to anemia or low thyroid  levels, but further investigation is needed.  HPI   Past Medical History:  Diagnosis Date   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   a.) Surveillance measurements: 2.6 cm (US  07/13/2017), 2.9 cm (CTA 09/04/2017), 2.9 cm (US  09/14/2018), 2.9 cm (US  10/03/2019), 2.6 cm (US  04/07/2020)   Amputation of fifth toe, right, traumatic, subsequent encounter (HCC) 06/18/2019   Anemia of chronic kidney failure    Anxiety    Aortic stenosis 03/18/2020   a.) TTE 03/18/2020: EF >55%; mild AS (MPG 8.7 mmHg). b.) TTE 11/16/2021: EF >55%; mild AS (MPG 9 mmHg)   CAD (coronary artery disease)    a.) s/p 3v CABG 03/29/2000   Cardiac arrest [facility])    Carotid artery stenosis    a.) s/p LEFT CEA 09/09/2003. b.)  Carotid doppler 88/78/7980: 1-39% LICA, CTO RICA; subclavian stenosis   Cataracts, bilateral    Cervical spondylosis without myelopathy    Chronic diastolic CHF (congestive heart failure), NYHA class 3 (HCC)    a.) TTE 05/27/2016: EF >55%, mild LA enlargement, triv PR, mild MR, mod TR; G3DD. b.) TTE 12/12/2017: EF >55%, mild LVH, BAE, mild MR/PR, mod TR; RVSP 52.8 mmHg. c.) TTE 03/18/2020: EF >55%, BAD, AS (MPG 8.7 mmHg); triv MR, mild TR/PR. d.) TTE 11/16/2021: EF >55%, LVH, G1DD, triv MR, mild PR, mod TR; AS (MPG 9 mmHg); MS (MPG 5 mmHg)   Chronic kidney disease, stage III (moderate) (HCC)    Chronic narcotic use 06/24/2014   Chronic pain syndrome    GERD (gastroesophageal reflux disease)    History of 2019 novel coronavirus disease (COVID-19) 09/22/2021   Hyperlipidemia    Hypertension    Long term current use of antithrombotics/antiplatelets    a.) on daily DAPT therapy (ASA + clopidogrel )   Lumbar stenosis with neurogenic claudication    Mitral stenosis 11/16/2021   a.) TTE 11/16/2021: EF >55%; mod MS (MPG 5 mmHg)   Osteoarthritis of hip    Postoperative wound infection 03/29/2022   Pulmonary hypertension (HCC) 12/12/2017   a.) TTE 12/12/2017: mild; RVSP 52.8 mmHg   PVD (peripheral vascular disease) (HCC)    Renal artery stenosis (HCC)    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    Past Surgical History:  Procedure Laterality Date   ABDOMINAL AORTOGRAM W/LOWER EXTREMITY N/A 10/06/2022   Procedure: ABDOMINAL AORTOGRAM W/LOWER EXTREMITY;  Surgeon: Lanis Fonda BRAVO, MD;  Location: [facility] INVASIVE CV LAB;  Service: Cardiovascular;  Laterality: N/A;   ABDOMINAL AORTOGRAM W/LOWER EXTREMITY N/A 05/04/2023   Procedure: ABDOMINAL AORTOGRAM W/LOWER EXTREMITY;  Surgeon: Lanis Fonda BRAVO, MD;  Location: [facility] INVASIVE CV LAB;  Service: Cardiovascular;  Laterality: N/A;   ABDOMINAL HYSTERECTOMY  1976   CAROTID ARTERY ANGIOPLASTY Left    CAROTID ENDARTERECTOMY Left 09/09/2003   Procedure: CAROTID ENDARTERECTOMY; Location: [facility]; Surgeon: Charlie Lango, MD   COLONOSCOPY WITH PROPOFOL  N/A 08/16/2017   Procedure: COLONOSCOPY WITH PROPOFOL ;  Surgeon: Jinny Carmine, MD;  Location: [facility] ENDOSCOPY;  Service: Endoscopy;  Laterality: N/A;   CORONARY ANGIOPLASTY WITH STENT PLACEMENT  2000   CORONARY ARTERY BYPASS GRAFT N/A 03/29/2000   Procedure: 3v CORONARY ARTERY BYPASS GRAFT; Location: [facility]   CYSTOSCOPY WITH STENT PLACEMENT Bilateral    ENDARTERECTOMY FEMORAL Bilateral 03/17/2022   Procedure: ENDARTERECTOMY FEMORAL ( BILATERAL SFA STENT);  Surgeon: Jama Cordella MATSU, MD;  Location: [facility] ORS;  Service: Vascular;  Laterality: Bilateral;   ENDARTERECTOMY FEMORAL Right 03/30/2022   Procedure: RE-EXPOSURE OF RIGHT COMMON FEMORAL ARTERY, RE-DO RIGHT FEMORAL ENDARTERECTOMY WITH VEIN PATCH;  Surgeon: Lanis Fonda BRAVO, MD;  Location: [facility] OR;  Service: Vascular;  Laterality: Right;  WOUND VAC   ESOPHAGOGASTRODUODENOSCOPY (EGD) WITH PROPOFOL  N/A 08/16/2017   Procedure: ESOPHAGOGASTRODUODENOSCOPY (EGD) WITH PROPOFOL ;  Surgeon: Jinny Carmine, MD;  Location: [facility] ENDOSCOPY;  Service: Endoscopy;  Laterality: N/A;   ESOPHAGOGASTRODUODENOSCOPY (EGD) WITH PROPOFOL  N/A 06/29/2018   Procedure: ESOPHAGOGASTRODUODENOSCOPY (EGD) WITH PROPOFOL ;  Surgeon: Janalyn Keene NOVAK, MD;  Location: [facility] ENDOSCOPY;  Service: Endoscopy;  Laterality: N/A;   GROIN DEBRIDEMENT Right 03/30/2022   Procedure: RIGHT GROIN IRRIGATION & DEBRIDEMENT;  Surgeon: Lanis Fonda BRAVO, MD;  Location: [facility] OR;  Service: Vascular;  Laterality: Right;   INSERTION OF ILIAC STENT Left 03/17/2022   Procedure: INSERTION OF ILIAC STENT;  Surgeon: Jama Cordella MATSU, MD;  Location: [facility] ORS;  Service: Vascular;  Laterality: Left;   LAPAROSCOPIC  CHOLECYSTECTOMY Left 10/26/1999   Procedure: LAPAROSCOPIC CHOLECYSTECTOMY; Location: [facility]; Surgeon: Unknown Sharps, MD   LEFT HEART CATH AND CORONARY ANGIOGRAPHY N/A 11/18/2021   Procedure: LEFT HEART CATH AND CORONARY ANGIOGRAPHY;  Surgeon: Florencio Cara BIRCH, MD;  Location: [facility] INVASIVE CV LAB;  Service: Cardiovascular;  Laterality: N/A;   LEFT HEART CATH AND CORS/GRAFTS ANGIOGRAPHY Left 11/19/2002   Procedure: LEFT HEART CATH AND CORS/GRAFTS ANGIOGRAPHY; Location: [facility]; Surgeon: Margie Florencio, MD   LEFT HEART CATH AND CORS/GRAFTS ANGIOGRAPHY Left 09/17/2003   Procedure: LEFT HEART CATH AND CORS/GRAFTS ANGIOGRAPHY; Location: [facility]; Surgeon: Margie Florencio, MD   LOWER EXTREMITY ANGIOGRAPHY Right 01/06/2022   Procedure: Lower Extremity Angiography;  Surgeon: Jama Cordella MATSU, MD;  Location: [facility] INVASIVE CV LAB;  Service: Cardiovascular;  Laterality: Right;   PERIPHERAL VASCULAR BALLOON ANGIOPLASTY  10/06/2022   Procedure: PERIPHERAL VASCULAR BALLOON ANGIOPLASTY;  Surgeon: Lanis Fonda BRAVO, MD;  Location: [facility] INVASIVE CV LAB;  Service: Cardiovascular;;  left iliac and bilateral renal   RENAL ARTERY ANGIOPLASTY Bilateral 12/2013   TOE AMPUTATION Right    small toe   TONSILLECTOMY AND ADENOIDECTOMY     TOTAL HIP ARTHROPLASTY Left 2005   TOTAL HIP ARTHROPLASTY  Right 2015   UPPER EXTREMITY ANGIOGRAPHY Left 11/23/2021   Procedure: UPPER EXTREMITY ANGIOGRAPHY;  Surgeon: Marea Selinda RAMAN, MD;  Location: [facility] INVASIVE CV LAB;  Service: Cardiovascular;  Laterality: Left;    Family History  Problem Relation Age of Onset   Stroke Mother    Hypertension Mother    Diabetes Mother    Hypertension Father    Heart disease Sister        MI   Multiple sclerosis Daughter    Multiple sclerosis Son    Cerebral aneurysm Son    Seizures Son    Cerebral aneurysm Son    Breast cancer Paternal Aunt 70    Social History   Socioeconomic History   Marital status: Divorced    Spouse name: Not on file    Number of children: 3   Years of education: Not on file   Highest education level: Not on file  Occupational History   Occupation: retired  Tobacco Use   Smoking status: Former    Current packs/day: 0.00    Types: Cigarettes    Quit date: 10/25/1998    Years since quitting: 25.0   Smokeless tobacco: Never  Vaping Use   Vaping status: Never Used  Substance and Sexual Activity   Alcohol use: No    Alcohol/week: 0.0 standard drinks of alcohol   Drug use: Never   Sexual activity: Not Currently  Other Topics Concern   Not on file  Social History Narrative   Daughter Grayce ([place]); 1 in the [employer]; 1 in [place]   Lives alone   Social Drivers of Health   Financial Resource Strain: Low Risk  (05/10/2023)   Overall Financial Resource Strain (CARDIA)    Difficulty of Paying Living Expenses: Not hard at all  Food Insecurity: No Food Insecurity (05/10/2023)   Hunger Vital Sign    Worried About Running Out of Food in the Last Year: Never true    Ran Out of Food in the Last Year: Never true  Transportation Needs: No Transportation Needs (05/10/2023)   PRAPARE - Administrator, Civil Service (Medical): No    Lack of Transportation (Non-Medical): No  Physical Activity: Inactive (05/10/2023)   Exercise Vital Sign    Days of Exercise per Week: 0 days    Minutes of Exercise per Session: 0 min  Stress: No Stress Concern Present (05/10/2023)   [employer] of Occupational Health - Occupational Stress Questionnaire    Feeling of Stress : Not at all  Social Connections: Moderately Integrated (05/10/2023)   Social Connection and Isolation Panel [NHANES]    Frequency of Communication with Friends and Family: More than three times a week    Frequency of Social Gatherings with Friends and Family: More than three times a week    Attends Religious Services: More than 4 times per year    Active Member of [employer] or Organizations: Yes    Attends Engineer, Structural: More than 4  times per year    Marital Status: Divorced  Intimate Partner Violence: Not At Risk (05/10/2023)   Humiliation, Afraid, Rape, and Kick questionnaire    Fear of Current or Ex-Partner: No    Emotionally Abused: No    Physically Abused: No    Sexually Abused: No     Outpatient Medications Prior to Visit  Medication Sig Dispense Refill   amLODipine  (NORVASC ) 10 MG tablet Take 1 tablet (10 mg total) by mouth at bedtime. 90 tablet 1   aspirin   81 MG tablet Take 81 mg by mouth in the morning.     azelastine  (ASTELIN ) 0.1 % nasal spray Place 1 spray into both nostrils 2 (two) times daily. Use in each nostril as directed 30 mL 12   chlorpheniramine-HYDROcodone  (TUSSIONEX) 10-8 MG/5ML Take 5 mLs by mouth every 12 (twelve) hours as needed for cough. 140 mL 0   clopidogrel  (PLAVIX ) 75 MG tablet Take 1 tablet (75 mg total) by mouth at bedtime. To prevent strokes 90 tablet 1   DULoxetine  (CYMBALTA ) 30 MG capsule Take 1 capsule (30 mg total) by mouth daily. 90 capsule 1   ezetimibe  (ZETIA ) 10 MG tablet Take 1 tablet (10 mg total) by mouth at bedtime. 90 tablet 3   ferrous sulfate  325 (65 FE) MG EC tablet Take 1 tablet (325 mg total) by mouth daily.     furosemide  (LASIX ) 20 MG tablet Take 1 tablet (20 mg total) by mouth daily. 30 tablet 3   hydrALAZINE  (APRESOLINE ) 50 MG tablet TAKE 1 TABLET BY MOUTH THREE TIMES DAILY AS NEEDED FOR BLOOD PRESSURE GREATER THAN 150 270 tablet 0   labetalol  (NORMODYNE ) 300 MG tablet Take 1 tablet (300 mg total) by mouth 2 (two) times daily. For hypertension 180 tablet 1   losartan  (COZAAR ) 50 MG tablet Take 1 tablet (50 mg total) by mouth at bedtime. 90 tablet 1   nortriptyline  (PAMELOR ) 10 MG capsule Take 1 capsule (10 mg total) by mouth 2 (two) times daily. For chronic headaches 180 capsule 3   pantoprazole  (PROTONIX ) 40 MG tablet One tablet upon waking,  take 30 minuts prior to eating 90 tablet 3   pregabalin  (LYRICA ) 25 MG capsule Take 1 capsule (25 mg total) by mouth at  bedtime for 15 days, THEN 2 capsules (50 mg total) at bedtime. 105 capsule 0   ranolazine  (RANEXA ) 500 MG 12 hr tablet Take 1 tablet (500 mg total) by mouth 2 (two) times daily. 60 tablet 3   triamcinolone  cream (KENALOG ) 0.1 % Apply 1 Application topically 2 (two) times daily. To all areas of rash 453.6 g 0   vitamin C  (ASCORBIC ACID ) 250 MG tablet Take 1 tablet (250 mg total) by mouth daily. 90 tablet 1   predniSONE  (DELTASONE ) 10 MG tablet 6 tablets on Day 1 , then reduce by 1 tablet daily until gone 21 tablet 0   No facility-administered medications prior to visit.    Allergies  Allergen Reactions   Celexa  [Citalopram ] Anaphylaxis    Throat closing    Dilaudid  [Hydromorphone  Hcl] Nausea And Vomiting   Effient [Prasugrel] Itching   Hydrochlorothiazide  Other (See Comments)    Decreased GFR (Nov 2015)   Liothyronine      Hair fell out, caused headaches    Nsaids     CKD stage III - avoid nephrotoxic drugs   Nubain [Nalbuphine Hcl]     Burning sensation in back   Penicillins Itching   Statins Itching    ROS Review of Systems Negative unless indicated in HPI.    Objective:    Physical Exam Constitutional:      Appearance: Normal appearance.  HENT:     Right Ear: Tympanic membrane normal. Tympanic membrane is not erythematous.     Left Ear: Tympanic membrane normal. There is impacted cerumen. Tympanic membrane is not erythematous.     Nose:     Right Turbinates: Not enlarged.     Left Turbinates: Not enlarged.     Right Sinus: No maxillary sinus tenderness or  frontal sinus tenderness.     Left Sinus: No maxillary sinus tenderness or frontal sinus tenderness.     Mouth/Throat:     Mouth: Mucous membranes are moist.     Pharynx: Postnasal drip present. No pharyngeal swelling, oropharyngeal exudate or posterior oropharyngeal erythema.     Tonsils: No tonsillar exudate.  Cardiovascular:     Rate and Rhythm: Normal rate and regular rhythm.  Pulmonary:     Effort: Pulmonary  effort is normal.     Breath sounds: Normal breath sounds. No stridor. No wheezing.  Neurological:     General: No focal deficit present.     Mental Status: She is alert and oriented to person, place, and time. Mental status is at baseline.  Psychiatric:        Mood and Affect: Mood normal.        Behavior: Behavior normal.        Thought Content: Thought content normal.        Judgment: Judgment normal.     BP 116/66   Pulse 61   Temp 98 F (36.7 C)   Resp 18   Ht 5' (1.524 m)   Wt 165 lb 6 oz (75 kg)   SpO2 97%   BMI 32.30 kg/m  Wt Readings from Last 3 Encounters:  10/28/23 165 lb 6 oz (75 kg)  09/28/23 167 lb 3.2 oz (75.8 kg)  09/15/23 169 lb (76.7 kg)     Health Maintenance  Topic Date Due   Zoster Vaccines- Shingrix (1 of 2) Never done   DEXA SCAN  Never done   Pneumonia Vaccine 26+ Years old (2 of 2 - PPSV23 or PCV20) 02/21/2014   MAMMOGRAM  02/19/2023   COVID-19 Vaccine (4 - 2024-25 season) 06/26/2023   INFLUENZA VACCINE  01/23/2024 (Originally 05/26/2023)   DTaP/Tdap/Td (2 - Td or Tdap) 01/11/2024   Medicare Annual Wellness (AWV)  05/09/2024   HPV VACCINES  Aged Out    There are no preventive care reminders to display for this patient.  Lab Results  Component Value Date   TSH 3.84 10/28/2023   Lab Results  Component Value Date   WBC 5.3 10/28/2023   HGB 10.7 (L) 10/28/2023   HCT 32.7 (L) 10/28/2023   MCV 90.3 10/28/2023   PLT 270 10/28/2023   Lab Results  Component Value Date   NA 140 08/25/2023   K 3.9 08/25/2023   CO2 25 08/25/2023   GLUCOSE 90 08/25/2023   BUN 23 08/25/2023   CREATININE 1.50 (H) 08/25/2023   BILITOT 0.6 04/26/2023   ALKPHOS 69 04/26/2023   AST 14 04/26/2023   ALT 10 04/26/2023   PROT 7.0 04/26/2023   ALBUMIN  3.8 04/26/2023   CALCIUM  9.8 08/25/2023   ANIONGAP 8 04/14/2022   GFR 32.48 (L) 08/25/2023   Lab Results  Component Value Date   CHOL 187 04/26/2023   Lab Results  Component Value Date   HDL 61.00  04/26/2023   Lab Results  Component Value Date   LDLCALC 116 (H) 04/26/2023   Lab Results  Component Value Date   TRIG 51.0 04/26/2023   Lab Results  Component Value Date   CHOLHDL 3 04/26/2023   Lab Results  Component Value Date   HGBA1C 5.9 07/19/2016      Assessment & Plan:  Other fatigue Assessment & Plan: Complaint of feeling tired, possible causes discussed include anemia, thyroid  level fluctuation, and ongoing cough. -Repeat blood work to assess hemoglobin and thyroid  levels. -Continue  iron supplementation.  Orders: -     CBC -     TSH  Other cough Assessment & Plan: Persistent cough, worse at night, with no associated fever, wheezing, or ear pain. Previous treatment with Mucinex  and codeine -containing cough suppressants provided partial relief. No signs of pneumonia on recent chest x-ray. -Start Doxycycline  as a preventative measure against pneumonia. -Consider repeat chest x-ray if symptoms do not improve.   Dizziness Assessment & Plan: Intermittent dizziness with changing position.  No dizziness at present.   -Advised patient to change positions slowly.  Increase fluid intake. -Patient would let us  know if symptoms not improving   Other orders -     Doxycycline  Hyclate; Take 1 tablet (100 mg total) by mouth 2 (two) times daily for 7 days.  Dispense: 14 tablet; Refill: 0 -     guaiFENesin  ER; Take 1 tablet (600 mg total) by mouth 2 (two) times daily.  Dispense: 20 tablet; Refill: 0    Follow-up: No follow-ups on file.   Lastacia Solum, NP

## 2023-10-29 LAB — CBC
HCT: 32.7 % — ABNORMAL LOW (ref 35.0–45.0)
Hemoglobin: 10.7 g/dL — ABNORMAL LOW (ref 11.7–15.5)
MCH: 29.6 pg (ref 27.0–33.0)
MCHC: 32.7 g/dL (ref 32.0–36.0)
MCV: 90.3 fL (ref 80.0–100.0)
MPV: 10.6 fL (ref 7.5–12.5)
Platelets: 270 10*3/uL (ref 140–400)
RBC: 3.62 10*6/uL — ABNORMAL LOW (ref 3.80–5.10)
RDW: 12.5 % (ref 11.0–15.0)
WBC: 5.3 10*3/uL (ref 3.8–10.8)

## 2023-10-29 LAB — TSH: TSH: 3.84 m[IU]/L (ref 0.40–4.50)

## 2023-10-29 NOTE — Assessment & Plan Note (Signed)
 Persistent cough, worse at night, with no associated fever, wheezing, or ear pain. Previous treatment with Mucinex  and codeine -containing cough suppressants provided partial relief. No signs of pneumonia on recent chest x-ray. -Start Doxycycline  as a preventative measure against pneumonia. -Consider repeat chest x-ray if symptoms do not improve.

## 2023-10-29 NOTE — Assessment & Plan Note (Signed)
 Intermittent dizziness with changing position.  No dizziness at present.   -Advised patient to change positions slowly.  Increase fluid intake. -Patient would let us know if symptoms not improving

## 2023-10-29 NOTE — Assessment & Plan Note (Addendum)
 Complaint of feeling tired, possible causes discussed include anemia, thyroid level fluctuation, and ongoing cough. -Repeat blood work to assess hemoglobin and thyroid levels. -Continue iron supplementation.

## 2023-10-30 ENCOUNTER — Encounter: Payer: Self-pay | Admitting: Internal Medicine

## 2023-10-30 DIAGNOSIS — R051 Acute cough: Secondary | ICD-10-CM

## 2023-10-31 ENCOUNTER — Telehealth: Payer: Self-pay

## 2023-10-31 MED ORDER — NORTRIPTYLINE HCL 10 MG PO CAPS: 10.0000 mg | ORAL_CAPSULE | Freq: Two times a day (BID) | ORAL | 3 refills | Status: DC

## 2023-10-31 NOTE — Telephone Encounter (Signed)
 Copied from CRM [id number]. Topic: General - Other >> Oct 31, 2023 10:37 AM Burnard DEL wrote: Reason for CRM: patient called stating that Dr Marylynn set her up with a new doctor Macdiarmid ,a new urologist,kidney doctor.However she would like to stay with Dr Marcelino. She is requesting an answer asap due to her already being scheduled for 11/07/2023. She would like to know is there a reason why she has to be seen by this new doctor

## 2023-10-31 NOTE — Telephone Encounter (Signed)
 Spoke with pt and informed her of the message below. Pt gave a verbal understanding.

## 2023-11-03 ENCOUNTER — Encounter: Payer: Self-pay | Admitting: Student in an Organized Health Care Education/Training Program

## 2023-11-03 ENCOUNTER — Ambulatory Visit
Payer: Medicare Other | Attending: Student in an Organized Health Care Education/Training Program | Admitting: Student in an Organized Health Care Education/Training Program

## 2023-11-03 VITALS — BP 139/60 | HR 67 | Temp 98.1°F | Resp 16 | Ht 60.0 in | Wt 162.0 lb

## 2023-11-03 DIAGNOSIS — G8929 Other chronic pain: Secondary | ICD-10-CM

## 2023-11-03 DIAGNOSIS — M47816 Spondylosis without myelopathy or radiculopathy, lumbar region: Secondary | ICD-10-CM

## 2023-11-03 DIAGNOSIS — M4726 Other spondylosis with radiculopathy, lumbar region: Secondary | ICD-10-CM | POA: Diagnosis not present

## 2023-11-03 DIAGNOSIS — I739 Peripheral vascular disease, unspecified: Secondary | ICD-10-CM | POA: Diagnosis not present

## 2023-11-03 DIAGNOSIS — Z96643 Presence of artificial hip joint, bilateral: Secondary | ICD-10-CM | POA: Diagnosis not present

## 2023-11-03 DIAGNOSIS — G894 Chronic pain syndrome: Secondary | ICD-10-CM

## 2023-11-03 DIAGNOSIS — M5416 Radiculopathy, lumbar region: Secondary | ICD-10-CM | POA: Diagnosis not present

## 2023-11-03 MED ORDER — DULOXETINE HCL 20 MG PO CPEP: ORAL_CAPSULE | ORAL | 0 refills | Status: DC

## 2023-11-03 NOTE — Progress Notes (Signed)
 PROVIDER NOTE: Information contained herein reflects review and annotations entered in association with encounter. Interpretation of such information and data should be left to medically-trained personnel. Information provided to patient can be located elsewhere in the medical record under Patient Instructions. Document created using STT-dictation technology, any transcriptional errors that may result from process are unintentional.    Patient: Sabrina [name]  Service Category: E/M  Provider: Wallie Sherry, MD  DOB: March 30, 1941  DOS: 11/03/2023  Referring Provider: [name], MD  MRN: [id number]  Specialty: Interventional Pain Management  PCP: [name], MD  Type: Established Patient  Setting: Ambulatory outpatient    Location: Office  Delivery: Face-to-face     HPI  Ms. Denim A Tristan, a 83 y.o. year old female, is here today because of her PAD (peripheral artery disease) (HCC) [I73.9]. Ms. Zeleznik primary complain today is Back Pain (Lumbar bilateral ), Hip Pain (Bilateral s/p joint replacement ), and Leg Pain (Upper thigh bilaterally )  Pertinent problems: Ms. Raske has Hip pain, bilateral; Peripheral vascular disease (HCC); Generalized anxiety disorder; Chronic right hip pain; Cervical spondylosis without myelopathy; Chronic radicular lumbar pain; Lumbar stenosis with neurogenic claudication; Osteoarthritis of right hip; Chronic pain syndrome; and Lumbar facet arthropathy on their pertinent problem list. Pain Assessment: Severity of Chronic pain is reported as a 0-No pain (10 when she gets up to walk)/10. Location: Back (bilateral hip and bilateral leg pain in thigh area) Lower, Right/denies. Onset: More than a month ago. Quality: Constant, Dull, Throbbing, Discomfort. Timing: Constant. Modifying factor(s): sitting down. Vitals:  height is 5' (1.524 m) and weight is 162 lb (73.5 kg). Her temporal temperature is 98.1 F (36.7 C). Her blood pressure is 139/60 and her pulse is  67. Her respiration is 16 and oxygen saturation is 100%.  BMI: Estimated body mass index is 31.64 kg/m as calculated from the following:   Height as of this encounter: 5' (1.524 m).   Weight as of this encounter: 162 lb (73.5 kg). Last encounter: 07/21/2023. Last procedure: Visit date not found.  Reason for encounter: medication management.   Discussed the use of AI scribe software for clinical note transcription with the patient, who gave verbal consent to proceed.  History of Present Illness   The patient, currently on Cymbalta  (duloxetine ) for neuropathic pain, reported experiencing side effects while on the lowest dose of Lyrica  (25mg ). The patient denied any side effects with the current Cymbalta  regimen. The patient's PCP is managing the Cymbalta  treatment. The patient did not report any other health concerns during the consultation.       09/13/23 The patient, with a history of chronic lower back pain, hip pain, and groin pain, presents for a follow-up visit. The pain is primarily in the lower back, followed by the hip and groin. The patient reports that the pain is exacerbated when lifting their legs, particularly when bending. The pain also extends to the front of their thighs, but only when lifting their legs.The patient has had a limited response to previous injections and has not found relief from the muscle relaxer, tizanidine  (Zanaflex ). They have been taking gabapentin  300mg  at bedtime for chronic pain management. The patient also has a history of kidney disease, which is reportedly stable according to a recent consultation with a nephrologist. They are on Plavix , an antiplatelet medication, and Cymbalta , an antidepressant also used for chronic pain management. The patient denies any relief from the muscle relaxer, Zanaflex . Despite these ongoing treatments, the patient's pain persists, impacting  their daily activities and quality of life.  HPI from last visit: Ms. Campos is  being evaluated for possible interventional pain management therapies for the treatment of her chronic pain.  Patient is a pleasant 83 year old female who presents with a chief complaint of low back pain as well as left anterior thigh pain and muscle spasms.  She states that the left anterior thigh pain has gotten worse.  She does have a history of peripheral arterial disease and iliac stents in place.  She feels that her left thigh pain may be related to a complication after her iliac stent procedure.  Of note, the patient has see me in the past, over 3 years ago for similar low back pain.  This was related to lumbar facet arthropathy and spondylosis.  She went on to have 2 diagnostic lumbar facet medial branch nerve blocks which unfortunately were not helpful.  Since then, she has been seeing neurology.  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  She is here to discuss options for chronic pain management.   ROS  Constitutional: Denies any fever or chills Gastrointestinal: No reported hemesis, hematochezia, vomiting, or acute GI distress Musculoskeletal:  As above Neurological: No reported episodes of acute onset apraxia, aphasia, dysarthria, agnosia, amnesia, paralysis, loss of coordination, or loss of consciousness  Medication Review  DULoxetine , amLODipine , aspirin , azelastine , clopidogrel , doxycycline , ezetimibe , ferrous sulfate , furosemide , guaiFENesin , hydrALAZINE , labetalol , losartan , nortriptyline , pantoprazole , ranolazine , triamcinolone  cream, and vitamin C   History Review  Allergy: Ms. Bodin is allergic to celexa  [citalopram ], dilaudid  [hydromorphone  hcl], effient [prasugrel], hydrochlorothiazide , liothyronine , nsaids, nubain [nalbuphine hcl], penicillins, and statins. Drug: Ms. Brasil  reports no history of drug use. Alcohol:  reports no history of alcohol use. Tobacco:  reports that she quit smoking about 25  years ago. Her smoking use included cigarettes. She has never used smokeless tobacco. Social: Ms. Siwik  reports that she quit smoking about 25 years ago. Her smoking use included cigarettes. She has never used smokeless tobacco. She reports that she does not drink alcohol and does not use drugs. Medical:  has a past medical history of Abdominal aortic ectasia (HCC) (07/13/2017), Amputation of fifth toe, right, traumatic, subsequent encounter (HCC) (06/18/2019), Anemia of chronic kidney failure, Anxiety, Aortic stenosis (03/18/2020), CAD (coronary artery disease), Cardiac arrest [facility]), Carotid artery stenosis, Cataracts, bilateral, Cervical spondylosis without myelopathy, Chronic diastolic CHF (congestive heart failure), NYHA class 3 (HCC), Chronic kidney disease, stage III (moderate) (HCC), Chronic narcotic use (06/24/2014), Chronic pain syndrome, GERD (gastroesophageal reflux disease), History of 2019 novel coronavirus disease (COVID-19) (09/22/2021), Hyperlipidemia, Hypertension, Long term current use of antithrombotics/antiplatelets, Lumbar stenosis with neurogenic claudication, Mitral stenosis (11/16/2021), Osteoarthritis of hip, Postoperative wound infection (03/29/2022), Pulmonary hypertension (HCC) (12/12/2017), PVD (peripheral vascular disease) (HCC), Renal artery stenosis (HCC), 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 Surgical: Ms. Gartley  has a past surgical history that includes Laparoscopic cholecystectomy (Left, 10/26/1999); Tonsillectomy and adenoidectomy; Abdominal hysterectomy (1976); Total hip arthroplasty (Left, 2005); Carotid angioplasty (Left); Coronary angioplasty with stent (2000); Toe amputation (Right); Cystoscopy with stent placement (Bilateral); Total hip arthroplasty (Right, 2015); Coronary artery bypass graft (N/A, 03/29/2000); Carotid endarterectomy (Left, 09/09/2003); Renal artery  angioplasty (Bilateral, 12/2013); Colonoscopy with propofol  (N/A, 08/16/2017); Esophagogastroduodenoscopy (egd) with propofol  (N/A, 08/16/2017); Esophagogastroduodenoscopy (egd) with propofol  (N/A, 06/29/2018); LEFT HEART CATH AND CORONARY ANGIOGRAPHY (N/A, 11/18/2021); Upper Extremity Angiography (Left, 11/23/2021); Lower Extremity Angiography (Right, 01/06/2022); LEFT  HEART CATH AND CORS/GRAFTS ANGIOGRAPHY (Left, 11/19/2002); LEFT HEART CATH AND CORS/GRAFTS ANGIOGRAPHY (Left, 09/17/2003); Endarterectomy femoral (Bilateral, 03/17/2022); Insertion of iliac stent (Left, 03/17/2022); Groin debridement (Right, 03/30/2022); Endarterectomy femoral (Right, 03/30/2022); ABDOMINAL AORTOGRAM W/LOWER EXTREMITY (N/A, 10/06/2022); PERIPHERAL VASCULAR BALLOON ANGIOPLASTY (10/06/2022); and ABDOMINAL AORTOGRAM W/LOWER EXTREMITY (N/A, 05/04/2023). Family: family history includes Breast cancer (age of onset: 54) in her paternal aunt; Cerebral aneurysm in her son and son; Diabetes in her mother; Heart disease in her sister; Hypertension in her father and mother; Multiple sclerosis in her daughter and son; Seizures in her son; Stroke in her mother.  Laboratory Chemistry Profile   Renal Lab Results  Component Value Date   BUN 23 08/25/2023   CREATININE 1.50 (H) 08/25/2023   LABCREA 86 03/06/2018   GFR 32.48 (L) 08/25/2023   GFRAA 50 (L) 03/10/2020   GFRNONAA 36 (L) 04/14/2022    Hepatic Lab Results  Component Value Date   AST 14 04/26/2023   ALT 10 04/26/2023   ALBUMIN  3.8 04/26/2023   ALKPHOS 69 04/26/2023   LIPASE 16.0 07/06/2017    Electrolytes Lab Results  Component Value Date   NA 140 08/25/2023   K 3.9 08/25/2023   CL 106 08/25/2023   CALCIUM  9.8 08/25/2023   MG 2.5 (H) 03/31/2022   PHOS 3.8 09/23/2022    Bone No results found for: VD25OH, VD125OH2TOT, CI6874NY7, CI7874NY7, 25OHVITD1, 25OHVITD2, 25OHVITD3, TESTOFREE, TESTOSTERONE  Inflammation (CRP: Acute Phase) (ESR: Chronic  Phase) Lab Results  Component Value Date   CRP <1.0 02/17/2023   ESRSEDRATE 53 (H) 02/17/2023   LATICACIDVEN 1.0 03/30/2022         Note: Above Lab results reviewed.  Recent Imaging Review  DG Chest 2 View CLINICAL DATA:  83 year old female with a history of persisting cough  EXAM: CHEST - 2 VIEW  COMPARISON:  11/03/2022, 03/29/2022  FINDINGS: Cardiomediastinal silhouette unchanged in size and contour. Surgical changes of median sternotomy and CABG no evidence of central vascular congestion. No interlobular septal thickening.  No pneumothorax or pleural effusion. Coarsened interstitial markings, with no confluent airspace disease.  No acute displaced fracture. Degenerative changes of the spine.  IMPRESSION: No radiographic evidence of acute cardiopulmonary disease  Electronically Signed   By: Ami Bellman D.O.   On: 10/10/2023 12:24 Note: Reviewed        Physical Exam  General appearance: Well nourished, well developed, and well hydrated. In no apparent acute distress Mental status: Alert, oriented x 3 (person, place, & time)       Respiratory: No evidence of acute respiratory distress Eyes: PERLA Vitals: BP 139/60 (BP Location: Left Arm, Patient Position: Sitting, Cuff Size: Normal)   Pulse 67   Temp 98.1 F (36.7 C) (Temporal)   Resp 16   Ht 5' (1.524 m)   Wt 162 lb (73.5 kg)   SpO2 100%   BMI 31.64 kg/m  BMI: Estimated body mass index is 31.64 kg/m as calculated from the following:   Height as of this encounter: 5' (1.524 m).   Weight as of this encounter: 162 lb (73.5 kg). Ideal: Ideal body weight: 45.5 kg (100 lb 4.9 oz) Adjusted ideal body weight: 56.7 kg (124 lb 15.8 oz)  Assessment   Diagnosis  1. PAD (peripheral artery disease) (HCC)   2. Lumbar facet arthropathy   3. Chronic radicular lumbar pain   4. Status post bilateral hip replacements   5. Chronic pain syndrome       Plan of Care  Assessment and Plan  Neuropathic Pain   She  was intolerant to Lyrica  (pregabalin ) 25 mg due to side effects but is currently on Cymbalta  (duloxetine ) 30 mg without side effects. To optimize pain management , we will increase the Cymbalta  dose, aiming for the therapeutic range of 40-60 mg, with a preference to maximize the current medication before adding new ones. Informed consent was obtained for the dose increase, explaining the potential benefits for nerve pain and mood stabilization. We will increase Cymbalta  to 40 mg daily (two 20 mg capsules) for 30 days, then to 60 mg daily (three 20 mg capsules) for the subsequent 30 days. We will inform her PCP, Dr. Tullo, about the medication adjustment and schedule a follow-up in 8 weeks to assess response and adjust treatment.  General Health Maintenance   She requested assistance with setting up MyChart. A nurse will assist with setting up MyChart.        Pharmacotherapy (Medications Ordered): Meds ordered this encounter  Medications   DULoxetine  (CYMBALTA ) 20 MG capsule    Sig: Take 2 capsules (40 mg total) by mouth daily for 30 days, THEN 3 capsules (60 mg total) daily.    Dispense:  150 capsule    Refill:  0   Orders:  No orders of the defined types were placed in this encounter.  Follow-up plan:   Return in about 8 weeks (around 12/29/2023) for MM, F2F.      Recent Visits Date Type Provider Dept  09/13/23 Office Visit Marcelino Nurse, MD [facility]  Showing recent visits within past 90 days and meeting all other requirements Today's Visits Date Type Provider Dept  11/03/23 Office Visit Marcelino Nurse, MD [facility]  Showing today's visits and meeting all other requirements Future Appointments Date Type Provider Dept  12/22/23 Appointment Marcelino Nurse, MD [facility]  Showing future appointments within next 90 days and meeting all other requirements  I discussed the assessment and treatment plan with the patient. The patient was provided an  opportunity to ask questions and all were answered. The patient agreed with the plan and demonstrated an understanding of the instructions.  Patient advised to call back or seek an in-person evaluation if the symptoms or condition worsens.  Duration of encounter: .  Total time on encounter, as per AMA guidelines included both the face-to-face and non-face-to-face time personally spent by the physician and/or other qualified health care professional(s) on the day of the encounter (includes time in activities that require the physician or other qualified health care professional and does not include time in activities normally performed by clinical staff). Physician's time may include the following activities when performed: Preparing to see the patient (e.g., pre-charting review of records, searching for previously ordered imaging, lab work, and nerve conduction tests) Review of prior analgesic pharmacotherapies. Reviewing PMP Interpreting ordered tests (e.g., lab work, imaging, nerve conduction tests) Performing post-procedure evaluations, including interpretation of diagnostic procedures Obtaining and/or reviewing separately obtained history Performing a medically appropriate examination and/or evaluation Counseling and educating the patient/family/caregiver Ordering medications, tests, or procedures Referring and communicating with other health care professionals (when not separately reported) Documenting clinical information in the electronic or other health record Independently interpreting results (not separately reported) and communicating results to the patient/ family/caregiver Care coordination (not separately reported)  Note by: Nurse Marcelino, MD Date: 11/03/2023; Time: 4:14 PM

## 2023-11-03 NOTE — Progress Notes (Signed)
 Safety precautions to be maintained throughout the outpatient stay will include: orient to surroundings, keep bed in low position, maintain call bell within reach at all times, provide assistance with transfer out of bed and ambulation.

## 2023-11-06 ENCOUNTER — Encounter: Payer: Self-pay | Admitting: Internal Medicine

## 2023-11-06 DIAGNOSIS — N183 Chronic kidney disease, stage 3 unspecified: Secondary | ICD-10-CM

## 2023-11-06 DIAGNOSIS — I10 Essential (primary) hypertension: Secondary | ICD-10-CM

## 2023-11-06 DIAGNOSIS — K219 Gastro-esophageal reflux disease without esophagitis: Secondary | ICD-10-CM

## 2023-11-07 ENCOUNTER — Ambulatory Visit: Payer: Medicare Other | Admitting: Urology

## 2023-11-07 ENCOUNTER — Encounter: Payer: Self-pay | Admitting: Urology

## 2023-11-07 ENCOUNTER — Ambulatory Visit (INDEPENDENT_AMBULATORY_CARE_PROVIDER_SITE_OTHER): Payer: Medicare Other | Admitting: Urology

## 2023-11-07 VITALS — BP 136/72 | HR 74 | Ht 61.0 in | Wt 162.0 lb

## 2023-11-07 DIAGNOSIS — R32 Unspecified urinary incontinence: Secondary | ICD-10-CM

## 2023-11-07 LAB — MICROSCOPIC EXAMINATION: Bacteria, UA: NONE SEEN

## 2023-11-07 LAB — URINALYSIS, COMPLETE
Bilirubin, UA: NEGATIVE
Glucose, UA: NEGATIVE
Leukocytes,UA: NEGATIVE
Nitrite, UA: NEGATIVE
Specific Gravity, UA: >1.03 — ABNORMAL HIGH (ref 1.005–1.030)
Urobilinogen, Ur: 0.2 mg/dL (ref 0.2–1.0)
pH, UA: 5.5 (ref 5.0–7.5)

## 2023-11-07 MED ORDER — AMLODIPINE BESYLATE 10 MG PO TABS: 10.0000 mg | ORAL_TABLET | Freq: Every day | ORAL | 1 refills | Status: DC

## 2023-11-07 MED ORDER — GEMTESA 75 MG PO TABS: 1.0000 | ORAL_TABLET | Freq: Every day | ORAL | Status: DC

## 2023-11-07 MED ORDER — CLOPIDOGREL BISULFATE 75 MG PO TABS: 75.0000 mg | ORAL_TABLET | Freq: Every day | ORAL | 1 refills | Status: DC

## 2023-11-07 NOTE — Progress Notes (Signed)
 11/07/2023 1:57 PM   Sabrina [name] Mar 19, 1941 [id number]  Referring provider: [name], MD [address] [place],  [place] [address]  Chief Complaint  Patient presents with   New Patient (Initial Visit)   Urinary Incontinence    HPI: I was consulted to assist the patient is urinary incontinence.  She has urge incontinence and no voiding.  Sometimes she might leak a small amount with coughing sneezing.  She wears 2 pads a day moderately wet  She voids every 3 hours and every 2 hours at night.  Her flow was good especially when she drinks a lot of fluids.  She thinks she may have had some sort of surgery or stent in her bladder years ago.  She has had a hysterectomy.  On further review it looks like she had a cystoscopy and bilateral stents in 2023 but I did not see it in the local medical record.  Since that time she had a normal renal ultrasound with no stents or hydronephrosis.  She has had a lot of vascular issues.  No history of kidney stones or bladder infections.  No neurologic issues.  Bowel function normal.  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 b.) TTE 11/16/2021: EF >55%; mild AS (MPG 9 mmHg)   CAD (coronary artery disease)    a.) s/p 3v CABG 03/29/2000   Cardiac arrest [facility])    Carotid artery stenosis    a.) s/p LEFT CEA 09/09/2003. b.) Carotid doppler 88/78/7980: 1-39% LICA, CTO RICA; subclavian stenosis   Cataracts, bilateral    Cervical spondylosis without myelopathy    Chronic diastolic CHF (congestive heart  failure), NYHA class 3 (HCC)    a.) TTE 05/27/2016: EF >55%, mild LA enlargement, triv PR, mild MR, mod TR; G3DD. b.) TTE 12/12/2017: EF >55%, mild LVH, BAE, mild MR/PR, mod TR; RVSP 52.8 mmHg. c.) TTE 03/18/2020: EF >55%, BAD, AS (MPG 8.7 mmHg); triv MR, mild TR/PR. d.) TTE 11/16/2021: EF >55%, LVH, G1DD, triv MR, mild PR, mod TR; AS (MPG 9 mmHg); MS (MPG 5 mmHg)   Chronic kidney disease, stage III (moderate) (HCC)    Chronic narcotic use 06/24/2014   Chronic pain syndrome    GERD (gastroesophageal reflux disease)    History of 2019 novel coronavirus disease (COVID-19) 09/22/2021   Hyperlipidemia    Hypertension    Long term current use of antithrombotics/antiplatelets    a.) on daily DAPT therapy (ASA + clopidogrel )   Lumbar stenosis with neurogenic claudication    Mitral stenosis 11/16/2021   a.) TTE 11/16/2021: EF >55%; mod MS (MPG 5 mmHg)   Osteoarthritis of hip    Postoperative wound infection 03/29/2022   Pulmonary hypertension (HCC) 12/12/2017   a.) TTE 12/12/2017: mild; RVSP 52.8 mmHg   PVD (peripheral vascular disease) (HCC)    Renal artery stenosis (HCC)    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    Surgical History: Past Surgical History:  Procedure Laterality Date   ABDOMINAL AORTOGRAM W/LOWER EXTREMITY N/A 10/06/2022   Procedure: ABDOMINAL AORTOGRAM W/LOWER EXTREMITY;  Surgeon: Lanis Fonda BRAVO, MD;  Location: [facility] INVASIVE CV LAB;  Service: Cardiovascular;  Laterality: N/A;   ABDOMINAL AORTOGRAM W/LOWER EXTREMITY N/A 05/04/2023   Procedure: ABDOMINAL AORTOGRAM W/LOWER EXTREMITY;  Surgeon: Lanis Fonda BRAVO, MD;  Location: [facility] INVASIVE CV LAB;  Service: Cardiovascular;  Laterality: N/A;   ABDOMINAL HYSTERECTOMY  1976   CAROTID ARTERY ANGIOPLASTY Left    CAROTID  ENDARTERECTOMY Left 09/09/2003   Procedure: CAROTID ENDARTERECTOMY; Location: [facility]; Surgeon: Charlie Lango, MD   COLONOSCOPY WITH PROPOFOL  N/A 08/16/2017   Procedure: COLONOSCOPY WITH PROPOFOL ;  Surgeon: Jinny Carmine, MD;  Location: [facility] ENDOSCOPY;  Service: Endoscopy;  Laterality: N/A;   CORONARY ANGIOPLASTY WITH STENT PLACEMENT  2000   CORONARY ARTERY BYPASS GRAFT N/A 03/29/2000   Procedure: 3v CORONARY ARTERY BYPASS GRAFT; Location: [facility]   CYSTOSCOPY WITH STENT PLACEMENT Bilateral    ENDARTERECTOMY FEMORAL Bilateral 03/17/2022   Procedure: ENDARTERECTOMY FEMORAL ( BILATERAL SFA STENT);  Surgeon: Jama Cordella MATSU, MD;  Location: [facility] ORS;  Service: Vascular;  Laterality: Bilateral;   ENDARTERECTOMY FEMORAL Right 03/30/2022   Procedure: RE-EXPOSURE OF RIGHT COMMON FEMORAL ARTERY, RE-DO RIGHT FEMORAL ENDARTERECTOMY WITH VEIN PATCH;  Surgeon: Lanis Fonda BRAVO, MD;  Location: [facility] OR;  Service: Vascular;  Laterality: Right;  WOUND VAC   ESOPHAGOGASTRODUODENOSCOPY (EGD) WITH PROPOFOL  N/A 08/16/2017   Procedure: ESOPHAGOGASTRODUODENOSCOPY (EGD) WITH PROPOFOL ;  Surgeon: Jinny Carmine, MD;  Location: [facility] ENDOSCOPY;  Service: Endoscopy;  Laterality: N/A;   ESOPHAGOGASTRODUODENOSCOPY (EGD) WITH PROPOFOL  N/A 06/29/2018   Procedure: ESOPHAGOGASTRODUODENOSCOPY (EGD) WITH PROPOFOL ;  Surgeon: Janalyn Keene NOVAK, MD;  Location: [facility];  Service: Endoscopy;  Laterality: N/A;   GROIN DEBRIDEMENT Right 03/30/2022   Procedure: RIGHT GROIN IRRIGATION & DEBRIDEMENT;  Surgeon: Lanis Fonda BRAVO, MD;  Location: [facility] OR;  Service: Vascular;  Laterality: Right;   INSERTION OF ILIAC STENT Left 03/17/2022   Procedure: INSERTION OF ILIAC STENT;  Surgeon: Jama Cordella MATSU, MD;  Location: [facility] ORS;  Service: Vascular;  Laterality: Left;   LAPAROSCOPIC CHOLECYSTECTOMY Left 10/26/1999   Procedure: LAPAROSCOPIC CHOLECYSTECTOMY; Location: [facility]; Surgeon: Unknown Sharps, MD   LEFT HEART CATH AND CORONARY ANGIOGRAPHY N/A  11/18/2021   Procedure: LEFT HEART CATH AND CORONARY ANGIOGRAPHY;  Surgeon: Florencio Cara BIRCH, MD;  Location: [facility] INVASIVE CV LAB;  Service: Cardiovascular;  Laterality: N/A;   LEFT HEART CATH AND CORS/GRAFTS ANGIOGRAPHY Left 11/19/2002   Procedure: LEFT HEART CATH AND CORS/GRAFTS ANGIOGRAPHY; Location: [facility]; Surgeon: Margie Florencio, MD   LEFT HEART CATH AND CORS/GRAFTS ANGIOGRAPHY Left 09/17/2003   Procedure: LEFT HEART CATH AND CORS/GRAFTS ANGIOGRAPHY; Location: [facility]; Surgeon: Margie Florencio, MD   LOWER EXTREMITY ANGIOGRAPHY Right 01/06/2022   Procedure: Lower Extremity Angiography;  Surgeon: Jama Cordella MATSU, MD;  Location: [facility] INVASIVE CV LAB;  Service: Cardiovascular;  Laterality: Right;   PERIPHERAL VASCULAR BALLOON ANGIOPLASTY  10/06/2022   Procedure: PERIPHERAL VASCULAR BALLOON ANGIOPLASTY;  Surgeon: Lanis Fonda BRAVO, MD;  Location: [facility] INVASIVE CV LAB;  Service: Cardiovascular;;  left iliac and bilateral renal   RENAL ARTERY ANGIOPLASTY Bilateral 12/2013   TOE AMPUTATION Right    small toe   TONSILLECTOMY AND ADENOIDECTOMY     TOTAL HIP ARTHROPLASTY Left 2005   TOTAL HIP ARTHROPLASTY Right 2015   UPPER EXTREMITY ANGIOGRAPHY Left  11/23/2021   Procedure: UPPER EXTREMITY ANGIOGRAPHY;  Surgeon: Marea Selinda RAMAN, MD;  Location: [facility] INVASIVE CV LAB;  Service: Cardiovascular;  Laterality: Left;    Home Medications:  Allergies as of 11/07/2023       Reactions   Celexa  [citalopram ] Anaphylaxis   Throat closing    Dilaudid  [hydromorphone  Hcl] Nausea And Vomiting   Effient [prasugrel] Itching   Hydrochlorothiazide  Other (See Comments)   Decreased GFR (Nov 2015)   Liothyronine     Hair fell out, caused headaches    Nsaids    CKD stage III - avoid nephrotoxic drugs   Nubain [nalbuphine Hcl]    Burning sensation in back   Penicillins Itching   Statins Itching        Medication List        Accurate as of November 07, 2023  1:57 PM. If you have any questions, ask your nurse or  doctor.          amLODipine  10 MG tablet Commonly known as: NORVASC  Take 1 tablet (10 mg total) by mouth at bedtime.   aspirin  81 MG tablet Take 81 mg by mouth in the morning.   azelastine  0.1 % nasal spray Commonly known as: ASTELIN  Place 1 spray into both nostrils 2 (two) times daily. Use in each nostril as directed   clopidogrel  75 MG tablet Commonly known as: PLAVIX  Take 1 tablet (75 mg total) by mouth at bedtime. To prevent strokes   DULoxetine  20 MG capsule Commonly known as: Cymbalta  Take 2 capsules (40 mg total) by mouth daily for 30 days, THEN 3 capsules (60 mg total) daily. Start taking on: November 03, 2023   ezetimibe  10 MG tablet Commonly known as: ZETIA  Take 1 tablet (10 mg total) by mouth at bedtime.   ferrous sulfate  325 (65 FE) MG EC tablet Take 1 tablet (325 mg total) by mouth daily.   furosemide  20 MG tablet Commonly known as: LASIX  Take 1 tablet (20 mg total) by mouth daily.   guaiFENesin  600 MG 12 hr tablet Commonly known as: Mucinex  Take 1 tablet (600 mg total) by mouth 2 (two) times daily.   hydrALAZINE  50 MG tablet Commonly known as: APRESOLINE  TAKE 1 TABLET BY MOUTH THREE TIMES DAILY AS NEEDED FOR BLOOD PRESSURE GREATER THAN 150   labetalol  300 MG tablet Commonly known as: NORMODYNE  Take 1 tablet (300 mg total) by mouth 2 (two) times daily. For hypertension   losartan  50 MG tablet Commonly known as: COZAAR  Take 1 tablet (50 mg total) by mouth at bedtime.   nortriptyline  10 MG capsule Commonly known as: PAMELOR  Take 1 capsule (10 mg total) by mouth 2 (two) times daily. For chronic headaches   pantoprazole  40 MG tablet Commonly known as: PROTONIX  One tablet upon waking,  take 30 minuts prior to eating   ranolazine  500 MG 12 hr tablet Commonly known as: RANEXA  Take 1 tablet (500 mg total) by mouth 2 (two) times daily.   triamcinolone  cream 0.1 % Commonly known as: KENALOG  Apply 1 Application topically 2 (two) times daily. To all  areas of rash   vitamin C  250 MG tablet Commonly known as: ASCORBIC ACID  Take 1 tablet (250 mg total) by mouth daily.        Allergies:  Allergies  Allergen Reactions   Celexa  [Citalopram ] Anaphylaxis    Throat closing    Dilaudid  [Hydromorphone  Hcl] Nausea And Vomiting   Effient [Prasugrel] Itching   Hydrochlorothiazide  Other (See Comments)    Decreased GFR (Nov  2015)   Liothyronine      Hair fell out, caused headaches    Nsaids     CKD stage III - avoid nephrotoxic drugs   Nubain [Nalbuphine Hcl]     Burning sensation in back   Penicillins Itching   Statins Itching    Family History: Family History  Problem Relation Age of Onset   Stroke Mother    Hypertension Mother    Diabetes Mother    Hypertension Father    Heart disease Sister        MI   Multiple sclerosis Daughter    Multiple sclerosis Son    Cerebral aneurysm Son    Seizures Son    Cerebral aneurysm Son    Breast cancer Paternal Aunt 8    Social History:  reports that she quit smoking about 25 years ago. Her smoking use included cigarettes. She has been exposed to tobacco smoke. She has never used smokeless tobacco. She reports that she does not drink alcohol and does not use drugs.  ROS:                                        Physical Exam: There were no vitals taken for this visit.  Constitutional:  Alert and oriented, No acute distress. HEENT: [place] AT, moist mucus membranes.  Trachea midline, no masses.  Laboratory Data: Lab Results  Component Value Date   WBC 5.3 10/28/2023   HGB 10.7 (L) 10/28/2023   HCT 32.7 (L) 10/28/2023   MCV 90.3 10/28/2023   PLT 270 10/28/2023    Lab Results  Component Value Date   CREATININE 1.50 (H) 08/25/2023    No results found for: PSA  No results found for: TESTOSTERONE  Lab Results  Component Value Date   HGBA1C 5.9 07/19/2016    Urinalysis    Component Value Date/Time   COLORURINE STRAW (A) 06/13/2021 0034    APPEARANCEUR CLEAR (A) 06/13/2021 0034   APPEARANCEUR Clear 03/27/2014 0944   LABSPEC 1.004 (L) 06/13/2021 0034   LABSPEC 1.006 03/27/2014 0944   PHURINE 6.0 06/13/2021 0034   GLUCOSEU NEGATIVE 06/13/2021 0034   GLUCOSEU 50 mg/dL 93/96/7984 9055   HGBUR SMALL (A) 06/13/2021 0034   BILIRUBINUR NEGATIVE 06/13/2021 0034   BILIRUBINUR Negative 03/27/2014 0944   KETONESUR NEGATIVE 06/13/2021 0034   PROTEINUR 30 (A) 06/13/2021 0034   NITRITE NEGATIVE 06/13/2021 0034   LEUKOCYTESUR NEGATIVE 06/13/2021 0034   LEUKOCYTESUR Negative 03/27/2014 0944    Pertinent Imaging: Urine reviewed and sent for culture.  Chart reviewed  Assessment & Plan: I would like to see the patient back on Gemtesa  samples and prescription for pelvic exam and cystoscopy in about 6 weeks.  She has milder frequency and nocturia.  She primarily has urge incontinence.  I do not think she needs urodynamics currently  1. Urinary incontinence, unspecified type (Primary)  - Urinalysis, Complete   No follow-ups on file.  Sabrina DELENA Elizabeth, MD  [facility] [address] [place], [place] [address] [phone]

## 2023-11-07 NOTE — Patient Instructions (Signed)

## 2023-11-08 ENCOUNTER — Other Ambulatory Visit: Payer: Medicare Other

## 2023-11-09 LAB — CULTURE, URINE COMPREHENSIVE

## 2023-11-10 ENCOUNTER — Inpatient Hospital Stay: Payer: Medicare Other | Attending: Oncology

## 2023-11-10 ENCOUNTER — Ambulatory Visit: Payer: Medicare Other | Admitting: Oncology

## 2023-11-10 DIAGNOSIS — D631 Anemia in chronic kidney disease: Secondary | ICD-10-CM | POA: Insufficient documentation

## 2023-11-10 DIAGNOSIS — N1832 Chronic kidney disease, stage 3b: Secondary | ICD-10-CM | POA: Insufficient documentation

## 2023-11-10 DIAGNOSIS — Z79899 Other long term (current) drug therapy: Secondary | ICD-10-CM | POA: Diagnosis not present

## 2023-11-10 LAB — COMPREHENSIVE METABOLIC PANEL
ALT: 11 U/L (ref 0–44)
AST: 17 U/L (ref 15–41)
Albumin: 3.7 g/dL (ref 3.5–5.0)
Alkaline Phosphatase: 73 U/L (ref 38–126)
Anion gap: 10 (ref 5–15)
BUN: 24 mg/dL — ABNORMAL HIGH (ref 8–23)
CO2: 24 mmol/L (ref 22–32)
Calcium: 9 mg/dL (ref 8.9–10.3)
Chloride: 101 mmol/L (ref 98–111)
Creatinine, Ser: 1.7 mg/dL — ABNORMAL HIGH (ref 0.44–1.00)
GFR, Estimated: 30 mL/min — ABNORMAL LOW (ref >60)
Glucose, Bld: 102 mg/dL — ABNORMAL HIGH (ref 70–99)
Potassium: 4.1 mmol/L (ref 3.5–5.1)
Sodium: 135 mmol/L (ref 135–145)
Total Bilirubin: 0.8 mg/dL (ref 0.0–1.2)
Total Protein: 6.9 g/dL (ref 6.5–8.1)

## 2023-11-10 LAB — CBC WITH DIFFERENTIAL/PLATELET
Abs Immature Granulocytes: 0.04 10*3/uL (ref 0.00–0.07)
Basophils Absolute: 0 10*3/uL (ref 0.0–0.1)
Basophils Relative: 0 %
Eosinophils Absolute: 0.3 10*3/uL (ref 0.0–0.5)
Eosinophils Relative: 4 %
HCT: 31.3 % — ABNORMAL LOW (ref 36.0–46.0)
Hemoglobin: 10.3 g/dL — ABNORMAL LOW (ref 12.0–15.0)
Immature Granulocytes: 1 %
Lymphocytes Relative: 21 %
Lymphs Abs: 1.5 10*3/uL (ref 0.7–4.0)
MCH: 29.3 pg (ref 26.0–34.0)
MCHC: 32.9 g/dL (ref 30.0–36.0)
MCV: 89.2 fL (ref 80.0–100.0)
Monocytes Absolute: 0.6 10*3/uL (ref 0.1–1.0)
Monocytes Relative: 8 %
Neutro Abs: 4.7 10*3/uL (ref 1.7–7.7)
Neutrophils Relative %: 66 %
Platelets: 262 10*3/uL (ref 150–400)
RBC: 3.51 MIL/uL — ABNORMAL LOW (ref 3.87–5.11)
RDW: 13.4 % (ref 11.5–15.5)
WBC: 7.1 10*3/uL (ref 4.0–10.5)
nRBC: 0 % (ref 0.0–0.2)

## 2023-11-10 LAB — RETIC PANEL
Immature Retic Fract: 8.2 % (ref 2.3–15.9)
RBC.: 3.47 MIL/uL — ABNORMAL LOW (ref 3.87–5.11)
Retic Count, Absolute: 78.1 10*3/uL (ref 19.0–186.0)
Retic Ct Pct: 2.3 % (ref 0.4–3.1)
Reticulocyte Hemoglobin: 32.9 pg (ref >27.9)

## 2023-11-10 LAB — IRON AND TIBC
Iron: 86 ug/dL (ref 28–170)
Saturation Ratios: 30 % (ref 10.4–31.8)
TIBC: 290 ug/dL (ref 250–450)
UIBC: 204 ug/dL

## 2023-11-10 LAB — FERRITIN: Ferritin: 223 ng/mL (ref 11–307)

## 2023-11-16 LAB — PROTEIN ELECTROPHORESIS, SERUM
A/G Ratio: 1.3 (ref 0.7–1.7)
Albumin ELP: 3.5 g/dL (ref 2.9–4.4)
Alpha-1-Globulin: 0.2 g/dL (ref 0.0–0.4)
Alpha-2-Globulin: 0.8 g/dL (ref 0.4–1.0)
Beta Globulin: 0.9 g/dL (ref 0.7–1.3)
Gamma Globulin: 0.9 g/dL (ref 0.4–1.8)
Globulin, Total: 2.8 g/dL (ref 2.2–3.9)
M-Spike, %: 0.3 g/dL — ABNORMAL HIGH
Total Protein ELP: 6.3 g/dL (ref 6.0–8.5)

## 2023-11-17 ENCOUNTER — Inpatient Hospital Stay: Payer: Medicare Other | Admitting: Oncology

## 2023-11-28 ENCOUNTER — Inpatient Hospital Stay: Payer: Medicare Other | Attending: Oncology | Admitting: Oncology

## 2023-11-28 ENCOUNTER — Other Ambulatory Visit: Payer: Self-pay | Admitting: Internal Medicine

## 2023-11-28 ENCOUNTER — Encounter: Payer: Self-pay | Admitting: Oncology

## 2023-11-28 ENCOUNTER — Encounter: Payer: Self-pay | Admitting: Internal Medicine

## 2023-11-28 VITALS — BP 175/74 | HR 77 | Temp 97.5°F | Resp 18 | Wt 164.3 lb

## 2023-11-28 DIAGNOSIS — Z833 Family history of diabetes mellitus: Secondary | ICD-10-CM | POA: Diagnosis not present

## 2023-11-28 DIAGNOSIS — Z886 Allergy status to analgesic agent status: Secondary | ICD-10-CM | POA: Insufficient documentation

## 2023-11-28 DIAGNOSIS — I130 Hypertensive heart and chronic kidney disease with heart failure and stage 1 through stage 4 chronic kidney disease, or unspecified chronic kidney disease: Secondary | ICD-10-CM | POA: Insufficient documentation

## 2023-11-28 DIAGNOSIS — Z888 Allergy status to other drugs, medicaments and biological substances status: Secondary | ICD-10-CM | POA: Diagnosis not present

## 2023-11-28 DIAGNOSIS — I739 Peripheral vascular disease, unspecified: Secondary | ICD-10-CM | POA: Insufficient documentation

## 2023-11-28 DIAGNOSIS — R5383 Other fatigue: Secondary | ICD-10-CM | POA: Diagnosis not present

## 2023-11-28 DIAGNOSIS — E785 Hyperlipidemia, unspecified: Secondary | ICD-10-CM | POA: Insufficient documentation

## 2023-11-28 DIAGNOSIS — Z820 Family history of epilepsy and other diseases of the nervous system: Secondary | ICD-10-CM | POA: Insufficient documentation

## 2023-11-28 DIAGNOSIS — Z8674 Personal history of sudden cardiac arrest: Secondary | ICD-10-CM | POA: Insufficient documentation

## 2023-11-28 DIAGNOSIS — Z79899 Other long term (current) drug therapy: Secondary | ICD-10-CM | POA: Insufficient documentation

## 2023-11-28 DIAGNOSIS — Z87891 Personal history of nicotine dependence: Secondary | ICD-10-CM | POA: Diagnosis not present

## 2023-11-28 DIAGNOSIS — Z880 Allergy status to penicillin: Secondary | ICD-10-CM | POA: Diagnosis not present

## 2023-11-28 DIAGNOSIS — N1832 Chronic kidney disease, stage 3b: Secondary | ICD-10-CM

## 2023-11-28 DIAGNOSIS — Z8616 Personal history of COVID-19: Secondary | ICD-10-CM | POA: Insufficient documentation

## 2023-11-28 DIAGNOSIS — K219 Gastro-esophageal reflux disease without esophagitis: Secondary | ICD-10-CM | POA: Diagnosis not present

## 2023-11-28 DIAGNOSIS — M549 Dorsalgia, unspecified: Secondary | ICD-10-CM | POA: Diagnosis not present

## 2023-11-28 DIAGNOSIS — N183 Chronic kidney disease, stage 3 unspecified: Secondary | ICD-10-CM | POA: Insufficient documentation

## 2023-11-28 DIAGNOSIS — Z7902 Long term (current) use of antithrombotics/antiplatelets: Secondary | ICD-10-CM | POA: Insufficient documentation

## 2023-11-28 DIAGNOSIS — Z803 Family history of malignant neoplasm of breast: Secondary | ICD-10-CM | POA: Insufficient documentation

## 2023-11-28 DIAGNOSIS — M2550 Pain in unspecified joint: Secondary | ICD-10-CM | POA: Insufficient documentation

## 2023-11-28 DIAGNOSIS — D631 Anemia in chronic kidney disease: Secondary | ICD-10-CM | POA: Insufficient documentation

## 2023-11-28 DIAGNOSIS — I5032 Chronic diastolic (congestive) heart failure: Secondary | ICD-10-CM | POA: Diagnosis not present

## 2023-11-28 DIAGNOSIS — F419 Anxiety disorder, unspecified: Secondary | ICD-10-CM | POA: Insufficient documentation

## 2023-11-28 DIAGNOSIS — Z885 Allergy status to narcotic agent status: Secondary | ICD-10-CM | POA: Diagnosis not present

## 2023-11-28 DIAGNOSIS — Z823 Family history of stroke: Secondary | ICD-10-CM | POA: Insufficient documentation

## 2023-11-28 DIAGNOSIS — Z90710 Acquired absence of both cervix and uterus: Secondary | ICD-10-CM | POA: Insufficient documentation

## 2023-11-28 DIAGNOSIS — Z9049 Acquired absence of other specified parts of digestive tract: Secondary | ICD-10-CM | POA: Diagnosis not present

## 2023-11-28 DIAGNOSIS — Z8269 Family history of other diseases of the musculoskeletal system and connective tissue: Secondary | ICD-10-CM | POA: Insufficient documentation

## 2023-11-28 DIAGNOSIS — G894 Chronic pain syndrome: Secondary | ICD-10-CM | POA: Insufficient documentation

## 2023-11-28 DIAGNOSIS — Z8249 Family history of ischemic heart disease and other diseases of the circulatory system: Secondary | ICD-10-CM | POA: Insufficient documentation

## 2023-11-28 MED ORDER — LOSARTAN POTASSIUM 50 MG PO TABS: 50.0000 mg | ORAL_TABLET | Freq: Every day | ORAL | 1 refills | Status: DC

## 2023-11-28 MED ORDER — FUROSEMIDE 20 MG PO TABS: 20.0000 mg | ORAL_TABLET | Freq: Every day | ORAL | 3 refills | Status: DC

## 2023-11-28 NOTE — Telephone Encounter (Signed)
Patient sent in duplicate request for losartan and lasix which have been refilled today. Called patient and informed her the Zetia has 3 refills available at her [facility] in [place] (sent on 10/06/23). Patient was not aware and states she will check with her pharmacy.

## 2023-11-28 NOTE — Progress Notes (Signed)
Hematology/Oncology Progress note [phone] [phone]      Patient Care Team: Sherlene Shams, MD as PCP - General (Internal Medicine) Rickard Patience, MD as Consulting Physician (Oncology)  ASSESSMENT & PLAN:   Anemia of chronic kidney failure, stage 3 (moderate) (HCC) Labs reviewed and discussed with patient Lab Results  Component Value Date   HGB 10.3 (L) 11/10/2023   TIBC 290 11/10/2023   IRONPCTSAT 30 11/10/2023   FERRITIN 223 11/10/2023    Hb is >10, no need for erythropoietin therapy Recommend patient to continue oral iron supplementation - Vitron C 1 tab   CKD (chronic kidney disease), stage III (HCC) Avoid nephro toxins.  Encourage oral hydration Encourage patient to follow up with nephrologist.  SPEP showed monoclonal M protein, likely MGUS. Will check myeloma panel, light chain ratio at next visit.  Patient's daughter was called during the encounter and updated about the recommendation.  Orders Placed This Encounter  Procedures   CBC with Differential (Cancer Center Only)    Standing Status:   Future    Expected Date:   05/27/2024    Expiration Date:   11/27/2024   Iron and TIBC    Standing Status:   Future    Expected Date:   05/27/2024    Expiration Date:   11/27/2024   Ferritin    Standing Status:   Future    Expected Date:   05/27/2024    Expiration Date:   11/27/2024   Multiple Myeloma Panel (SPEP&IFE w/QIG)    Standing Status:   Future    Expected Date:   05/27/2024    Expiration Date:   11/27/2024   Kappa/lambda light chains    Standing Status:   Future    Expected Date:   05/27/2024    Expiration Date:   11/27/2024   CMP (Cancer Center only)    Standing Status:   Future    Expected Date:   05/27/2024    Expiration Date:   11/27/2024   Follow up  6 months  lab prior to MD  All questions were answered. The patient knows to call the clinic with any problems, questions or concerns.  Rickard Patience, MD, PhD [facility] Hematology Oncology 11/28/2023    REASON FOR VISIT Follow up for management of anemia   HISTORY OF PRESENTING ILLNESS:  Sabrina [name] is a  83 y.o.  female with PMH listed below who was referred to me for evaluation of anemia.  Patient recently had lab work done which revealed anemia with hemoglobin 11.5.   Reviewed patient's previous labs, her hemoglobin ranges from 10.8 to 11.8 in the past.  Reports feeling fatigue.  Patient denies weight loss, easy bruising, hematochezia, hemoptysis.  Has history of CKD and follows up with nephrologist.  Chronic pain syndrome. On Tramadol and fentanyl patch.      INTERVAL HISTORY Sabrina [name] is a 83 y.o. female who has above history reviewed by me today presents for follow up visit for management of anemia.  She reports feeling well.  She takes oral iron supplementation She feels well today with no new complaints.    Review of Systems  Constitutional:  Positive for malaise/fatigue. Negative for chills, fever and weight loss.  HENT:  Negative for sore throat.   Eyes:  Negative for redness.  Respiratory:  Negative for cough, shortness of breath and wheezing.   Cardiovascular:  Negative for chest pain, palpitations and leg swelling.  Gastrointestinal:  Negative for abdominal pain, blood  in stool, nausea and vomiting.  Genitourinary:  Negative for dysuria.  Musculoskeletal:  Positive for back pain and joint pain. Negative for myalgias.  Skin:  Negative for rash.  Neurological:  Negative for dizziness, tingling and tremors.  Endo/Heme/Allergies:  Does not bruise/bleed easily.  Psychiatric/Behavioral:  Negative for hallucinations.     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 b.) TTE 11/16/2021: EF >55%; mild AS (MPG 9 mmHg)   CAD (coronary artery disease)    a.) s/p 3v CABG 03/29/2000   Cardiac arrest [facility])    Carotid artery stenosis    a.) s/p LEFT CEA 09/09/2003. b.) Carotid doppler 40/98/1191: 1-39% LICA, CTO RICA; subclavian stenosis   Cataracts, bilateral    Cervical spondylosis without myelopathy    Chronic diastolic CHF (congestive heart failure), NYHA class 3 (HCC)    a.) TTE 05/27/2016: EF >55%, mild LA enlargement, triv PR, mild MR, mod TR; G3DD. b.) TTE 12/12/2017: EF >55%, mild LVH, BAE, mild MR/PR, mod TR; RVSP 52.8 mmHg. c.) TTE 03/18/2020: EF >55%, BAD, AS (MPG 8.7 mmHg); triv MR, mild TR/PR. d.) TTE 11/16/2021: EF >55%, LVH, G1DD, triv MR, mild PR, mod TR; AS (MPG 9 mmHg); MS (MPG 5 mmHg)   Chronic kidney disease, stage III (moderate) (HCC)    Chronic narcotic use 06/24/2014   Chronic pain syndrome    GERD (gastroesophageal reflux disease)    History of 2019 novel coronavirus disease (COVID-19) 09/22/2021   Hyperlipidemia    Hypertension    Long term current use of antithrombotics/antiplatelets    a.) on daily DAPT therapy (ASA + clopidogrel)   Lumbar stenosis with neurogenic claudication    Mitral stenosis 11/16/2021   a.) TTE 11/16/2021: EF >55%; mod MS (MPG 5 mmHg)   Osteoarthritis of hip    Postoperative wound infection 03/29/2022   Pulmonary hypertension (HCC) 12/12/2017   a.) TTE 12/12/2017: mild; RVSP 52.8 mmHg   PVD (peripheral vascular disease) (HCC)    Renal artery stenosis (HCC)    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    SURGICAL HISTORY: Past Surgical History:   Procedure Laterality Date   ABDOMINAL AORTOGRAM W/LOWER EXTREMITY N/A 10/06/2022   Procedure: ABDOMINAL AORTOGRAM W/LOWER EXTREMITY;  Surgeon: Victorino Sparrow, MD;  Location: [facility] INVASIVE CV LAB;  Service: Cardiovascular;  Laterality: N/A;   ABDOMINAL AORTOGRAM W/LOWER EXTREMITY N/A 05/04/2023   Procedure: ABDOMINAL AORTOGRAM W/LOWER EXTREMITY;  Surgeon: Victorino Sparrow, MD;  Location: [facility] INVASIVE CV LAB;  Service: Cardiovascular;  Laterality: N/A;   ABDOMINAL HYSTERECTOMY  1976   CAROTID ARTERY ANGIOPLASTY Left    CAROTID ENDARTERECTOMY Left 09/09/2003   Procedure: CAROTID ENDARTERECTOMY; Location: [facility]; Surgeon: Magda Bernheim, MD   COLONOSCOPY WITH PROPOFOL N/A 08/16/2017   Procedure: COLONOSCOPY WITH PROPOFOL;  Surgeon: Midge Minium, MD;  Location: [facility] ENDOSCOPY;  Service: Endoscopy;  Laterality: N/A;   CORONARY ANGIOPLASTY WITH STENT PLACEMENT  2000   CORONARY ARTERY BYPASS GRAFT N/A 03/29/2000   Procedure: 3v CORONARY ARTERY BYPASS GRAFT; Location: [facility]   CYSTOSCOPY WITH STENT PLACEMENT Bilateral    ENDARTERECTOMY FEMORAL Bilateral 03/17/2022   Procedure: ENDARTERECTOMY FEMORAL ( BILATERAL SFA STENT);  Surgeon: Renford Dills, MD;  Location: [facility] ORS;  Service: Vascular;  Laterality: Bilateral;   ENDARTERECTOMY FEMORAL Right 03/30/2022   Procedure: RE-EXPOSURE OF RIGHT COMMON FEMORAL ARTERY, RE-DO RIGHT FEMORAL ENDARTERECTOMY WITH VEIN PATCH;  Surgeon: Victorino Sparrow, MD;  Location: [facility] OR;  Service: Vascular;  Laterality: Right;  WOUND VAC   ESOPHAGOGASTRODUODENOSCOPY (EGD) WITH PROPOFOL N/A 08/16/2017   Procedure: ESOPHAGOGASTRODUODENOSCOPY (EGD) WITH PROPOFOL;  Surgeon: Midge Minium, MD;  Location: [facility] ENDOSCOPY;  Service: Endoscopy;  Laterality: N/A;   ESOPHAGOGASTRODUODENOSCOPY (EGD) WITH PROPOFOL N/A 06/29/2018   Procedure: ESOPHAGOGASTRODUODENOSCOPY (EGD) WITH PROPOFOL;  Surgeon: Pasty Spillers, MD;  Location: [facility] ENDOSCOPY;  Service: Endoscopy;  Laterality: N/A;    GROIN DEBRIDEMENT Right 03/30/2022   Procedure: RIGHT GROIN IRRIGATION & DEBRIDEMENT;  Surgeon: Victorino Sparrow, MD;  Location: [facility] OR;  Service: Vascular;  Laterality: Right;   INSERTION OF ILIAC STENT Left 03/17/2022   Procedure: INSERTION OF ILIAC STENT;  Surgeon: Renford Dills, MD;  Location: [facility] ORS;  Service: Vascular;  Laterality: Left;   LAPAROSCOPIC CHOLECYSTECTOMY Left 10/26/1999   Procedure: LAPAROSCOPIC CHOLECYSTECTOMY; Location: [facility]; Surgeon: Renda Rolls, MD   LEFT HEART CATH AND CORONARY ANGIOGRAPHY N/A 11/18/2021   Procedure: LEFT HEART CATH AND CORONARY ANGIOGRAPHY;  Surgeon: Alwyn Pea, MD;  Location: [facility] INVASIVE CV LAB;  Service: Cardiovascular;  Laterality: N/A;   LEFT HEART CATH AND CORS/GRAFTS ANGIOGRAPHY Left 11/19/2002   Procedure: LEFT HEART CATH AND CORS/GRAFTS ANGIOGRAPHY; Location: [facility]; Surgeon: Rudean Hitt, MD   LEFT HEART CATH AND CORS/GRAFTS ANGIOGRAPHY Left 09/17/2003   Procedure: LEFT HEART CATH AND CORS/GRAFTS ANGIOGRAPHY; Location: [facility]; Surgeon: Rudean Hitt, MD   LOWER EXTREMITY ANGIOGRAPHY Right 01/06/2022   Procedure: Lower Extremity Angiography;  Surgeon: Renford Dills, MD;  Location: [facility] INVASIVE CV LAB;  Service: Cardiovascular;  Laterality: Right;   PERIPHERAL VASCULAR BALLOON ANGIOPLASTY  10/06/2022   Procedure: PERIPHERAL VASCULAR BALLOON ANGIOPLASTY;  Surgeon: Victorino Sparrow, MD;  Location: [facility] INVASIVE CV LAB;  Service: Cardiovascular;;  left iliac and bilateral renal   RENAL ARTERY ANGIOPLASTY Bilateral 12/2013   TOE AMPUTATION Right    small toe   TONSILLECTOMY AND ADENOIDECTOMY     TOTAL HIP ARTHROPLASTY Left 2005   TOTAL HIP ARTHROPLASTY Right 2015   UPPER EXTREMITY ANGIOGRAPHY Left 11/23/2021   Procedure: UPPER EXTREMITY ANGIOGRAPHY;  Surgeon: Annice Needy, MD;  Location: [facility] INVASIVE CV LAB;  Service: Cardiovascular;  Laterality: Left;    SOCIAL HISTORY: Social History   Socioeconomic History   Marital  status: Divorced    Spouse name: Not on file   Number of children: 3   Years of education: Not on file   Highest education level: Not on file  Occupational History   Occupation: retired  Tobacco Use   Smoking status: Former    Current packs/day: 0.00    Types: Cigarettes    Quit date: 10/25/1998    Years since quitting: 25.1    Passive exposure: Past   Smokeless tobacco: Never  Vaping Use   Vaping status: Never Used  Substance and Sexual Activity   Alcohol use: No    Alcohol/week: 0.0 standard drinks of alcohol  Drug use: Never   Sexual activity: Not Currently  Other Topics Concern   Not on file  Social History Narrative   Daughter Zella Ball ([place]); 1 in [employer]; 1 in [place]   Lives alone   Social Drivers of Health   Financial Resource Strain: Low Risk  (05/10/2023)   Overall Financial Resource Strain (CARDIA)    Difficulty of Paying Living Expenses: Not hard at all  Food Insecurity: No Food Insecurity (05/10/2023)   Hunger Vital Sign    Worried About Running Out of Food in the Last Year: Never true    Ran Out of Food in the Last Year: Never true  Transportation Needs: No Transportation Needs (05/10/2023)   PRAPARE - Administrator, Civil Service (Medical): No    Lack of Transportation (Non-Medical): No  Physical Activity: Inactive (05/10/2023)   Exercise Vital Sign    Days of Exercise per Week: 0 days    Minutes of Exercise per Session: 0 min  Stress: No Stress Concern Present (05/10/2023)   [employer] of Occupational Health - Occupational Stress Questionnaire    Feeling of Stress : Not at all  Social Connections: Moderately Integrated (05/10/2023)   Social Connection and Isolation Panel [NHANES]    Frequency of Communication with Friends and Family: More than three times a week    Frequency of Social Gatherings with Friends and Family: More than three times a week    Attends Religious Services: More than 4 times per year    Active Member of [employer]  or Organizations: Yes    Attends Engineer, structural: More than 4 times per year    Marital Status: Divorced  Intimate Partner Violence: Not At Risk (05/10/2023)   Humiliation, Afraid, Rape, and Kick questionnaire    Fear of Current or Ex-Partner: No    Emotionally Abused: No    Physically Abused: No    Sexually Abused: No    FAMILY HISTORY: Family History  Problem Relation Age of Onset   Stroke Mother    Hypertension Mother    Diabetes Mother    Hypertension Father    Heart disease Sister        MI   Multiple sclerosis Daughter    Multiple sclerosis Son    Cerebral aneurysm Son    Seizures Son    Cerebral aneurysm Son    Breast cancer Paternal Aunt 28    ALLERGIES:  is allergic to celexa [citalopram], dilaudid [hydromorphone hcl], effient [prasugrel], hydrochlorothiazide, liothyronine, nsaids, nubain [nalbuphine hcl], penicillins, and statins.  MEDICATIONS:  Current Outpatient Medications  Medication Sig Dispense Refill   amLODipine (NORVASC) 10 MG tablet Take 1 tablet (10 mg total) by mouth at bedtime. 90 tablet 1   aspirin 81 MG tablet Take 81 mg by mouth in the morning.     azelastine (ASTELIN) 0.1 % nasal spray Place 1 spray into both nostrils 2 (two) times daily. Use in each nostril as directed 30 mL 12   clopidogrel (PLAVIX) 75 MG tablet Take 1 tablet (75 mg total) by mouth at bedtime. To prevent strokes 90 tablet 1   DULoxetine (CYMBALTA) 20 MG capsule Take 2 capsules (40 mg total) by mouth daily for 30 days, THEN 3 capsules (60 mg total) daily. 150 capsule 0   ezetimibe (ZETIA) 10 MG tablet Take 1 tablet (10 mg total) by mouth at bedtime. 90 tablet 3   ferrous sulfate 325 (65 FE) MG EC tablet Take 1 tablet (325 mg total) by  mouth daily.     furosemide (LASIX) 20 MG tablet Take 1 tablet (20 mg total) by mouth daily. 30 tablet 3   guaiFENesin (MUCINEX) 600 MG 12 hr tablet Take 1 tablet (600 mg total) by mouth 2 (two) times daily. 20 tablet 0   hydrALAZINE  (APRESOLINE) 50 MG tablet TAKE 1 TABLET BY MOUTH THREE TIMES DAILY AS NEEDED FOR BLOOD PRESSURE GREATER THAN 150 270 tablet 0   labetalol (NORMODYNE) 300 MG tablet Take 1 tablet (300 mg total) by mouth 2 (two) times daily. For hypertension 180 tablet 1   losartan (COZAAR) 50 MG tablet Take 1 tablet (50 mg total) by mouth at bedtime. 90 tablet 1   nortriptyline (PAMELOR) 10 MG capsule Take 1 capsule (10 mg total) by mouth 2 (two) times daily. For chronic headaches 180 capsule 3   pantoprazole (PROTONIX) 40 MG tablet One tablet upon waking,  take 30 minuts prior to eating 90 tablet 3   ranolazine (RANEXA) 500 MG 12 hr tablet Take 1 tablet (500 mg total) by mouth 2 (two) times daily. 60 tablet 3   triamcinolone cream (KENALOG) 0.1 % Apply 1 Application topically 2 (two) times daily. To all areas of rash 453.6 g 0   Vibegron (GEMTESA) 75 MG TABS Take 1 tablet (75 mg total) by mouth daily.     vitamin C (ASCORBIC ACID) 250 MG tablet Take 1 tablet (250 mg total) by mouth daily. 90 tablet 1   No current facility-administered medications for this visit.     PHYSICAL EXAMINATION: ECOG PERFORMANCE STATUS: 1 - Symptomatic but completely ambulatory Vitals:   11/28/23 1307  BP: (!) 175/74  Pulse: 77  Resp: 18  Temp: (!) 97.5 F (36.4 C)   Filed Weights   11/28/23 1307  Weight: 164 lb 4.8 oz (74.5 kg)    Physical Exam Constitutional:      General: She is not in acute distress.    Appearance: She is well-developed.  Eyes:     General: No scleral icterus. Cardiovascular:     Rate and Rhythm: Normal rate and regular rhythm.     Heart sounds: Murmur heard.  Pulmonary:     Effort: Pulmonary effort is normal. No respiratory distress.     Breath sounds: No wheezing.  Abdominal:     General: Bowel sounds are normal. There is no distension.     Palpations: Abdomen is soft.  Musculoskeletal:        General: No deformity. Normal range of motion.     Cervical back: Normal range of motion and neck  supple.  Lymphadenopathy:     Cervical: No cervical adenopathy.  Skin:    General: Skin is warm and dry.     Findings: No erythema or rash.  Neurological:     Mental Status: She is alert and oriented to person, place, and time. Mental status is at baseline.     Cranial Nerves: No cranial nerve deficit.  Psychiatric:        Mood and Affect: Mood normal.      LABORATORY DATA:  I have reviewed the data as listed    Latest Ref Rng & Units 11/10/2023    3:14 PM 10/28/2023    4:08 PM 07/27/2023   10:36 AM  CBC  WBC 4.0 - 10.5 K/uL 7.1  5.3  5.3   Hemoglobin 12.0 - 15.0 g/dL 16.1  09.6  04.5   Hematocrit 36.0 - 46.0 % 31.3  32.7  30.5   Platelets  150 - 400 K/uL 262  270  268       Latest Ref Rng & Units 11/10/2023    3:14 PM 08/25/2023   11:50 AM 05/04/2023   10:00 AM  CMP  Glucose 70 - 99 mg/dL 811  90  95   BUN 8 - 23 mg/dL 24  23  23    Creatinine 0.44 - 1.00 mg/dL 9.14  7.82  9.56   Sodium 135 - 145 mmol/L 135  140  137   Potassium 3.5 - 5.1 mmol/L 4.1  3.9  4.2   Chloride 98 - 111 mmol/L 101  106  104   CO2 22 - 32 mmol/L 24  25    Calcium 8.9 - 10.3 mg/dL 9.0  9.8    Total Protein 6.5 - 8.1 g/dL 6.9     Total Bilirubin 0.0 - 1.2 mg/dL 0.8     Alkaline Phos 38 - 126 U/L 73     AST 15 - 41 U/L 17     ALT 0 - 44 U/L 11

## 2023-11-28 NOTE — Assessment & Plan Note (Addendum)
Labs reviewed and discussed with patient Lab Results  Component Value Date   HGB 10.3 (L) 11/10/2023   TIBC 290 11/10/2023   IRONPCTSAT 30 11/10/2023   FERRITIN 223 11/10/2023    Hb is >10, no need for erythropoietin therapy Recommend patient to continue oral iron supplementation - Vitron C 1 tab

## 2023-11-28 NOTE — Assessment & Plan Note (Signed)
Avoid nephro toxins.  Encourage oral hydration Encourage patient to follow up with nephrologist.  SPEP showed monoclonal M protein, likely MGUS. Will check myeloma panel, light chain ratio at next visit.

## 2023-11-28 NOTE — Telephone Encounter (Signed)
Copied from CRM [id number]. Topic: Clinical - Medication Refill >> Nov 28, 2023  3:55 PM Florestine Avers wrote: Most Recent Primary Care Visit:  Provider: Kara Dies  Department: [facility]  Visit Type: ACUTE  Date: 10/28/2023  Medication: ezetimibe (ZETIA) 10 MG tablet, furosemide (LASIX) 20 MG tablet and losartan (COZAAR) 50 MG tablet  Has the patient contacted their pharmacy? Yes (Agent: If no, request that the patient contact the pharmacy for the refill. If patient does not wish to contact the pharmacy document the reason why and proceed with request.) (Agent: If yes, when and what did the pharmacy advise?)  Is this the correct pharmacy for this prescription? Yes If no, delete pharmacy and type the correct one.  This is the patient's preferred pharmacy:  [facility] [address]), [place] - [address] [place]) [place] [address] Phone: [phone] Fax: [phone]   Has the prescription been filled recently? No  Is the patient out of the medication? Yes  Has the patient been seen for an appointment in the last year OR does the patient have an upcoming appointment? Yes  Can we respond through MyChart? Yes  Agent: Please be advised that Rx refills may take up to 3 business days. We ask that you follow-up with your pharmacy.

## 2023-12-14 ENCOUNTER — Ambulatory Visit: Payer: Self-pay | Admitting: Internal Medicine

## 2023-12-14 NOTE — Telephone Encounter (Signed)
 Pt is scheduled for 01/03/2024

## 2023-12-14 NOTE — Telephone Encounter (Signed)
Copied from CRM [id number]. Topic: Clinical - Red Word Triage >> Dec 14, 2023 11:15 AM Deaijah H wrote: Red Word that prompted transfer to Nurse Triage: Referral for dermatology, rash on hands and feet (randomly)   Chief Complaint: Rash Symptoms: Rash Frequency: Constant  Pertinent Negatives: Patient denies fever or pain Disposition: [] ED /[] Urgent Care (no appt availability in office) / [x] Appointment(In office/virtual)/ []  [place] [facility]/ [] Home Care/ [] Refused Recommended Disposition /[] [place] [facility]/ []  Follow-up with PCP Additional Notes: patient reports she has had a small rash for the last year. She reports she has 6 little spots like pimples to the back of both hands, below her right ankle, above both her wrists, and near both elbows. She states that the rash it itchy but not painful. She denies any other symptoms. Patient would like an appointment to be evaluated and for a possible dermatologist referral. Appointment scheduled and patient instructed to call back for new or worsening symptoms. Patient verbalized understanding and agreement with this plan.    Reason for Disposition  Mild localized rash    Appointment made per patient's request  Answer Assessment - Initial Assessment Questions 1. APPEARANCE of RASH: "Describe the rash."      Little pimple that when scratched spreads  2. LOCATION: "Where is the rash located?"      Back of both hands, below right ankle, both wrists, and both elbows  3. NUMBER: "How many spots are there?"      6 spots  4. SIZE: "How big are the spots?" (Inches, centimeters or compare to size of a coin)      Little like a pimple  5. ONSET: "When did the rash start?"      1 year  6. ITCHING: "Does the rash itch?" If Yes, ask: "How bad is the itch?"  (Scale 0-10; or none, mild, moderate, severe)     Moderate to severe  7. PAIN: "Does the rash hurt?" If Yes, ask: "How bad is the pain?"  (Scale 0-10; or none, mild, moderate, severe)    -  NONE (0): no pain    - MILD (1-3): doesn't interfere with normal activities     - MODERATE (4-7): interferes with normal activities or awakens from sleep     - SEVERE (8-10): excruciating pain, unable to do any normal activities     No 8. OTHER SYMPTOMS: "Do you have any other symptoms?" (e.g., fever)     No  Protocols used: Rash or Redness - Localized-A-AH

## 2023-12-15 ENCOUNTER — Encounter: Payer: Medicare Other | Admitting: Student in an Organized Health Care Education/Training Program

## 2023-12-22 ENCOUNTER — Encounter: Payer: Medicare Other | Admitting: Student in an Organized Health Care Education/Training Program

## 2023-12-22 DIAGNOSIS — M5442 Lumbago with sciatica, left side: Secondary | ICD-10-CM | POA: Diagnosis not present

## 2023-12-22 DIAGNOSIS — R202 Paresthesia of skin: Secondary | ICD-10-CM | POA: Diagnosis not present

## 2023-12-22 DIAGNOSIS — R200 Anesthesia of skin: Secondary | ICD-10-CM | POA: Diagnosis not present

## 2023-12-22 DIAGNOSIS — M5441 Lumbago with sciatica, right side: Secondary | ICD-10-CM | POA: Diagnosis not present

## 2023-12-22 DIAGNOSIS — G629 Polyneuropathy, unspecified: Secondary | ICD-10-CM | POA: Diagnosis not present

## 2023-12-22 DIAGNOSIS — M79604 Pain in right leg: Secondary | ICD-10-CM | POA: Diagnosis not present

## 2023-12-22 DIAGNOSIS — G8929 Other chronic pain: Secondary | ICD-10-CM | POA: Diagnosis not present

## 2023-12-22 DIAGNOSIS — M79605 Pain in left leg: Secondary | ICD-10-CM | POA: Diagnosis not present

## 2023-12-27 ENCOUNTER — Ambulatory Visit
Payer: Medicare Other | Attending: Student in an Organized Health Care Education/Training Program | Admitting: Student in an Organized Health Care Education/Training Program

## 2023-12-27 ENCOUNTER — Encounter: Payer: Self-pay | Admitting: Student in an Organized Health Care Education/Training Program

## 2023-12-27 DIAGNOSIS — G894 Chronic pain syndrome: Secondary | ICD-10-CM

## 2023-12-27 DIAGNOSIS — M47816 Spondylosis without myelopathy or radiculopathy, lumbar region: Secondary | ICD-10-CM

## 2023-12-27 DIAGNOSIS — Z96643 Presence of artificial hip joint, bilateral: Secondary | ICD-10-CM | POA: Diagnosis not present

## 2023-12-27 MED ORDER — DULOXETINE HCL 20 MG PO CPEP: 20.0000 mg | ORAL_CAPSULE | Freq: Every day | ORAL | 5 refills | Status: DC

## 2023-12-27 MED ORDER — BUPRENORPHINE 5 MCG/HR TD PTWK: 1.0000 | MEDICATED_PATCH | TRANSDERMAL | 0 refills | Status: AC

## 2023-12-27 MED ORDER — BUPRENORPHINE 7.5 MCG/HR TD PTWK: 1.0000 | MEDICATED_PATCH | TRANSDERMAL | 0 refills | Status: AC

## 2023-12-27 NOTE — Progress Notes (Signed)
 Safety precautions to be maintained throughout the outpatient stay will include: orient to surroundings, keep bed in low position, maintain call bell within reach at all times, provide assistance with transfer out of bed and ambulation.

## 2023-12-27 NOTE — Progress Notes (Signed)
 PROVIDER NOTE: Information contained herein reflects review and annotations entered in association with encounter. Interpretation of such information and data should be left to medically-trained personnel. Information provided to patient can be located elsewhere in the medical record under "Patient Instructions". Document created using STT-dictation technology, any transcriptional errors that may result from process are unintentional.    Patient: Sabrina [name]  Service Category: E/M  Provider: Edward Jolly, MD  DOB: [birth year]  DOS: 12/27/2023  Referring Provider: Sherlene Shams, MD  MRN: [id number]  Specialty: Interventional Pain Management  PCP: Sherlene Shams, MD  Type: Established Patient  Setting: Ambulatory outpatient    Location: Office  Delivery: Face-to-face     HPI  Sabrina [name], a 83 y.o. year old female, is here today because of her No primary diagnosis found.. Sabrina [name] primary complain today is Back Pain (low), Hip Pain (bilateral), and Leg Pain (Bilateral anterior thighs)  Pertinent problems: Ms. Rafuse has Hip pain, bilateral; Peripheral vascular disease (HCC); Generalized anxiety disorder; Chronic right hip pain; Cervical spondylosis without myelopathy; Chronic radicular lumbar pain; Lumbar stenosis with neurogenic claudication; Osteoarthritis of right hip; Chronic pain syndrome; and Lumbar facet arthropathy on their pertinent problem list. Pain Assessment: Severity of Chronic pain is reported as a 10-Worst pain ever (when ambulatory)/10. Location: Back Lower/hips and legs. Onset: More than a month ago. Quality: Dull, Aching. Timing: Intermittent (constant when walking). Modifying factor(s): resting. Vitals:  height is 5' (1.524 m) and weight is 170 lb (77.1 kg). Her temporal temperature is 97.7 F (36.5 C). Her blood pressure is 139/55 (abnormal) and her pulse is 63. Her respiration is 18 and oxygen saturation is 99%.  BMI: Estimated body mass index is 33.2 kg/m  as calculated from the following:   Height as of this encounter: 5' (1.524 m).   Weight as of this encounter: 170 lb (77.1 kg). Last encounter: 07/21/2023. Last procedure: Visit date not found.  Reason for encounter: medication management.   Discussed the use of AI scribe software for clinical note transcription with the patient, who gave verbal consent to proceed.  History of Present Illness   Sabrina [name] is an 83 year old female who presents with intolerance to higher dose of Cymbalta and persistent lower back and hip pain.  She experiences persistent pain in the lower back, both hips, and the front of her legs, significantly impacting her mobility. She describes difficulty lifting her legs, which affects her daily activities. She has a history of difficulty tolerating injections, limiting her treatment options for pain management.  She attempted to increase her Cymbalta dose but was unable to tolerate it due to unspecified cognitive or mood issues, leading to discontinuation of the higher dose after two to three days. She continues to take Cymbalta at 20 mg daily and requires a refill for the next six months.       09/13/23 The patient, with a history of chronic lower back pain, hip pain, and groin pain, presents for a follow-up visit. The pain is primarily in the lower back, followed by the hip and groin. The patient reports that the pain is exacerbated when lifting their legs, particularly when bending. The pain also extends to the front of their thighs, but only when lifting their legs.The patient has had a limited response to previous injections and has not found relief from the muscle relaxer, tizanidine (Zanaflex). They have been taking gabapentin 300mg  at bedtime for chronic pain management. The patient also has a history  of kidney disease, which is reportedly stable according to a recent consultation with a nephrologist. They are on Plavix, an antiplatelet medication, and  Cymbalta, an antidepressant also used for chronic pain management. The patient denies any relief from the muscle relaxer, Zanaflex. Despite these ongoing treatments, the patient's pain persists, impacting their daily activities and quality of life.  HPI from last visit: Sabrina [name] is being evaluated for possible interventional pain management therapies for the treatment of her chronic pain.  Patient is a pleasant 83 year old female who presents with a chief complaint of low back pain as well as left anterior thigh pain and muscle spasms.  She states that the left anterior thigh pain has gotten worse.  She does have a history of peripheral arterial disease and iliac stents in place.  She feels that her left thigh pain may be related to a complication after her iliac stent procedure.  Of note, the patient has see me in the past, over 3 years ago for similar low back pain.  This was related to lumbar facet arthropathy and spondylosis.  She went on to have 2 diagnostic lumbar facet medial branch nerve blocks which unfortunately were not helpful.  Since then, she has been seeing neurology.  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  She is here to discuss options for chronic pain management.   ROS  Constitutional: Denies any fever or chills Gastrointestinal: No reported hemesis, hematochezia, vomiting, or acute GI distress Musculoskeletal:  As above Neurological: No reported episodes of acute onset apraxia, aphasia, dysarthria, agnosia, amnesia, paralysis, loss of coordination, or loss of consciousness  Medication Review  DULoxetine, Vibegron, amLODipine, aspirin, azelastine, buprenorphine, clopidogrel, ezetimibe, ferrous sulfate, furosemide, hydrALAZINE, labetalol, losartan, nortriptyline, pantoprazole, ranolazine, and vitamin C  History Review  Allergy: Sabrina [name] is allergic to celexa [citalopram], dilaudid [hydromorphone  hcl], effient [prasugrel], hydrochlorothiazide, liothyronine, nsaids, nubain [nalbuphine hcl], penicillins, and statins. Drug: Sabrina [name]  reports no history of drug use. Alcohol:  reports no history of alcohol use. Tobacco:  reports that she quit smoking about 25 years ago. Her smoking use included cigarettes. She has been exposed to tobacco smoke. She has never used smokeless tobacco. Social: Sabrina [name]  reports that she quit smoking about 25 years ago. Her smoking use included cigarettes. She has been exposed to tobacco smoke. She has never used smokeless tobacco. She reports that she does not drink alcohol and does not use drugs. Medical:  has a past medical history of Abdominal aortic ectasia (HCC) (07/13/2017), Amputation of fifth toe, right, traumatic, subsequent encounter (HCC) (06/18/2019), Anemia of chronic kidney failure, Anxiety, Aortic stenosis (03/18/2020), CAD (coronary artery disease), Cardiac arrest [facility]), Carotid artery stenosis, Cataracts, bilateral, Cervical spondylosis without myelopathy, Chronic diastolic CHF (congestive heart failure), NYHA class 3 (HCC), Chronic kidney disease, stage III (moderate) (HCC), Chronic narcotic use (06/24/2014), Chronic pain syndrome, GERD (gastroesophageal reflux disease), History of 2019 novel coronavirus disease (COVID-19) (09/22/2021), Hyperlipidemia, Hypertension, Long term current use of antithrombotics/antiplatelets, Lumbar stenosis with neurogenic claudication, Mitral stenosis (11/16/2021), Osteoarthritis of hip, Postoperative wound infection (03/29/2022), Pulmonary hypertension (HCC) (12/12/2017), PVD (peripheral vascular disease) (HCC), Renal artery stenosis (HCC), 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 Surgical: Sabrina [name]  has a past surgical history that includes Laparoscopic cholecystectomy (Left, 10/26/1999); Tonsillectomy and adenoidectomy;  Abdominal hysterectomy (1976); Total hip arthroplasty (Left, 2005); Carotid angioplasty (Left); Coronary angioplasty with stent (2000); Toe  amputation (Right); Cystoscopy with stent placement (Bilateral); Total hip arthroplasty (Right, 2015); Coronary artery bypass graft (N/A, 03/29/2000); Carotid endarterectomy (Left, 09/09/2003); Renal artery angioplasty (Bilateral, 12/2013); Colonoscopy with propofol (N/A, 08/16/2017); Esophagogastroduodenoscopy (egd) with propofol (N/A, 08/16/2017); Esophagogastroduodenoscopy (egd) with propofol (N/A, 06/29/2018); LEFT HEART CATH AND CORONARY ANGIOGRAPHY (N/A, 11/18/2021); Upper Extremity Angiography (Left, 11/23/2021); Lower Extremity Angiography (Right, 01/06/2022); LEFT HEART CATH AND CORS/GRAFTS ANGIOGRAPHY (Left, 11/19/2002); LEFT HEART CATH AND CORS/GRAFTS ANGIOGRAPHY (Left, 09/17/2003); Endarterectomy femoral (Bilateral, 03/17/2022); Insertion of iliac stent (Left, 03/17/2022); Groin debridement (Right, 03/30/2022); Endarterectomy femoral (Right, 03/30/2022); ABDOMINAL AORTOGRAM W/LOWER EXTREMITY (N/A, 10/06/2022); PERIPHERAL VASCULAR BALLOON ANGIOPLASTY (10/06/2022); and ABDOMINAL AORTOGRAM W/LOWER EXTREMITY (N/A, 05/04/2023). Family: family history includes Breast cancer (age of onset: 70) in her paternal aunt; Cerebral aneurysm in her son and son; Diabetes in her mother; Heart disease in her sister; Hypertension in her father and mother; Multiple sclerosis in her daughter and son; Seizures in her son; Stroke in her mother.  Laboratory Chemistry Profile   Renal Lab Results  Component Value Date   BUN 24 (H) 11/10/2023   CREATININE 1.70 (H) 11/10/2023   LABCREA 86 03/06/2018   GFR 32.48 (L) 08/25/2023   GFRAA 50 (L) 03/10/2020   GFRNONAA 30 (L) 11/10/2023    Hepatic Lab Results  Component Value Date   AST 17 11/10/2023   ALT 11 11/10/2023   ALBUMIN 3.7 11/10/2023   ALKPHOS 73 11/10/2023   LIPASE 16.0 07/06/2017    Electrolytes Lab Results  Component  Value Date   NA 135 11/10/2023   K 4.1 11/10/2023   CL 101 11/10/2023   CALCIUM 9.0 11/10/2023   MG 2.5 (H) 03/31/2022   PHOS 3.8 09/23/2022    Bone No results found for: "VD25OH", "VD125OH2TOT", "WU9811BJ4", "NW2956OZ3", "25OHVITD1", "25OHVITD2", "25OHVITD3", "TESTOFREE", "TESTOSTERONE"  Inflammation (CRP: Acute Phase) (ESR: Chronic Phase) Lab Results  Component Value Date   CRP <1.0 02/17/2023   ESRSEDRATE 53 (H) 02/17/2023   LATICACIDVEN 1.0 03/30/2022         Note: Above Lab results reviewed.  Recent Imaging Review  DG Chest 2 View CLINICAL DATA:  83 year old female with a history of persisting cough  EXAM: CHEST - 2 VIEW  COMPARISON:  11/03/2022, 03/29/2022  FINDINGS: Cardiomediastinal silhouette unchanged in size and contour. Surgical changes of median sternotomy and CABG no evidence of central vascular congestion. No interlobular septal thickening.  No pneumothorax or pleural effusion. Coarsened interstitial markings, with no confluent airspace disease.  No acute displaced fracture. Degenerative changes of the spine.  IMPRESSION: No radiographic evidence of acute cardiopulmonary disease  Electronically Signed   By: Gilmer Mor D.O.   On: 10/10/2023 12:24 Note: Reviewed        Physical Exam  General appearance: Well nourished, well developed, and well hydrated. In no apparent acute distress Mental status: Alert, oriented x 3 (person, place, & time)       Respiratory: No evidence of acute respiratory distress Eyes: PERLA Vitals: BP (!) 139/55   Pulse 63   Temp 97.7 F (36.5 C) (Temporal)   Resp 18   Ht 5' (1.524 m)   Wt 170 lb (77.1 kg)   SpO2 99%   BMI 33.20 kg/m  BMI: Estimated body mass index is 33.2 kg/m as calculated from the following:   Height as of this encounter: 5' (1.524 m).   Weight as of this encounter: 170 lb (77.1 kg). Ideal: Ideal body weight: 45.5 kg (100 lb 4.9 oz) Adjusted ideal body weight: 58.1 kg (128  lb 3  oz)  Assessment   Diagnosis  1. Lumbar facet arthropathy   2. Status post bilateral hip replacements   3. Chronic pain syndrome       Plan of Care  Assessment and Plan    Chronic Pain Syndrome   Chronic pain affects her lower back, hips, and anterior legs, making it difficult to lift her legs. Higher doses of Cymbalta were previously not tolerated due to cognitive and mood issues, so she currently takes 20 mg daily. Medication management has limitations, and she has difficulty tolerating nerve blocks or injections. A Butrans patch is proposed as an alternative, offering lower risk of dependence e compared to other opioids. Finding the right medication and dosage requires trial and error. Initiate the Butrans patch at 5 mcg/hour for one month, increasing to 7.5 mcg/hour if tolerated. A follow-up appointment is scheduled in 8 weeks to assess pain relief and tolerance. Continue Cymbalta 20 mg daily. Educate on proper application and rotation of the Butrans patch, advising against using a heating pad over it. Monitor for side effects such as constipation, mood changes, and leg swelling.  Follow-up   A follow-up appointment is scheduled in 8 weeks.        Pharmacotherapy (Medications Ordered): Meds ordered this encounter  Medications   DULoxetine (CYMBALTA) 20 MG capsule    Sig: Take 1 capsule (20 mg total) by mouth daily.    Dispense:  30 capsule    Refill:  5   buprenorphine (BUTRANS) 5 MCG/HR PTWK    Sig: Place 1 patch onto the skin once a week for 28 days.    Dispense:  4 patch    Refill:  0    Chronic Pain: STOP Act (Not applicable) Fill 1 day early if closed on refill date. Avoid benzodiazepines within 8 hours of opioids   buprenorphine (BUTRANS) 7.5 MCG/HR    Sig: Place 1 patch onto the skin once a week for 28 days.    Dispense:  4 patch    Refill:  0    Chronic Pain: STOP Act (Not applicable) Fill 1 day early if closed on refill date. Avoid benzodiazepines within 8 hours of  opioids   Orders:  No orders of the defined types were placed in this encounter.  Follow-up plan:   Return in about 8 weeks (around 02/21/2024) for MM, F2F.      Recent Visits Date Type Provider Dept  11/03/23 Office Visit Edward Jolly, MD [facility]  Showing recent visits within past 90 days and meeting all other requirements Today's Visits Date Type Provider Dept  12/27/23 Office Visit Edward Jolly, MD [facility]  Showing today's visits and meeting all other requirements Future Appointments Date Type Provider Dept  02/14/24 Appointment Edward Jolly, MD [facility]  Showing future appointments within next 90 days and meeting all other requirements  I discussed the assessment and treatment plan with the patient. The patient was provided an opportunity to ask questions and all were answered. The patient agreed with the plan and demonstrated an understanding of the instructions.  Patient advised to call back or seek an in-person evaluation if the symptoms or condition worsens.  Duration of encounter: .  Total time on encounter, as per AMA guidelines included both the face-to-face and non-face-to-face time personally spent by the physician and/or other qualified health care professional(s) on the day of the encounter (includes time in activities that require the physician or other qualified health care professional and  does not include time in activities normally performed by clinical staff). Physician's time may include the following activities when performed: Preparing to see the patient (e.g., pre-charting review of records, searching for previously ordered imaging, lab work, and nerve conduction tests) Review of prior analgesic pharmacotherapies. Reviewing PMP Interpreting ordered tests (e.g., lab work, imaging, nerve conduction tests) Performing post-procedure evaluations, including interpretation of diagnostic procedures Obtaining and/or  reviewing separately obtained history Performing a medically appropriate examination and/or evaluation Counseling and educating the patient/family/caregiver Ordering medications, tests, or procedures Referring and communicating with other health care professionals (when not separately reported) Documenting clinical information in the electronic or other health record Independently interpreting results (not separately reported) and communicating results to the patient/ family/caregiver Care coordination (not separately reported)  Note by: Edward Jolly, MD Date: 12/27/2023; Time: 3:11 PM

## 2024-01-03 ENCOUNTER — Encounter: Payer: Self-pay | Admitting: Internal Medicine

## 2024-01-03 ENCOUNTER — Ambulatory Visit: Payer: Medicare Other | Admitting: Internal Medicine

## 2024-01-03 VITALS — BP 114/62 | HR 75 | Ht 60.0 in | Wt 165.6 lb

## 2024-01-03 DIAGNOSIS — L430 Hypertrophic lichen planus: Secondary | ICD-10-CM | POA: Diagnosis not present

## 2024-01-03 DIAGNOSIS — Z780 Asymptomatic menopausal state: Secondary | ICD-10-CM

## 2024-01-03 DIAGNOSIS — Z1231 Encounter for screening mammogram for malignant neoplasm of breast: Secondary | ICD-10-CM

## 2024-01-03 MED ORDER — RANOLAZINE ER 500 MG PO TB12: 500.0000 mg | ORAL_TABLET | Freq: Two times a day (BID) | ORAL | 3 refills | Status: DC

## 2024-01-03 MED ORDER — BETAMETHASONE DIPROPIONATE 0.05 % EX CREA: TOPICAL_CREAM | Freq: Two times a day (BID) | CUTANEOUS | 0 refills | Status: DC

## 2024-01-03 MED ORDER — HYDROXYZINE PAMOATE 25 MG PO CAPS: 25.0000 mg | ORAL_CAPSULE | Freq: Four times a day (QID) | ORAL | 0 refills | Status: DC | PRN

## 2024-01-03 NOTE — Assessment & Plan Note (Signed)
 Suggested by exam.  All areas have become lichenified and hyperpigmented.  High potency steroid cream ,, hydroxyzine prescribed, ad dermatology referral In progress

## 2024-01-03 NOTE — Progress Notes (Signed)
 Subjective:  Patient ID: Sabrina [name], female    DOB: 1941/05/05  Age: 83 y.o. 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 Diagnoses of Encounter for screening mammogram for malignant neoplasm of breast and Postmenopausal estrogen deficiency were also pertinent to this visit.   HPI Sabrina [name] presents for  Chief Complaint  Patient presents with   Rash    Rash is worsening    83 yr old female with chronic pain  htn , PAD presents with a persistent hyperpigmented pruritic rash  that has been present since May 2024.  The rash starts with a small papule that exudes clear fluid when scratched, and becomes intensely pruritic.  It started on  dorsal surface of hands, over the MCPS,  and has spread to the lower tibia bilateral avoce the ankles  as well as the sides of her torso ,  elbows , and inner thighs.  She has used triamcinolone cream without change.  The rash leaves the skin hyperpgimented   She has  not seen dermatology yet.    Outpatient Medications Prior to Visit  Medication Sig Dispense Refill   amLODipine (NORVASC) 10 MG tablet Take 1 tablet (10 mg total) by mouth at bedtime. 90 tablet 1   aspirin 81 MG tablet Take 81 mg by mouth in the morning.     azelastine (ASTELIN) 0.1 % nasal spray Place 1 spray into both nostrils 2 (two) times daily. Use in each nostril as directed 30 mL 12   buprenorphine (BUTRANS) 5 MCG/HR PTWK Place 1 patch onto the skin once a week for 28 days. 4 patch 0   clopidogrel (PLAVIX) 75 MG tablet Take 1 tablet (75 mg total) by mouth at bedtime. To prevent strokes 90 tablet 1   DULoxetine (CYMBALTA) 20 MG capsule Take 1 capsule (20 mg total) by mouth daily. 30 capsule 5   ezetimibe (ZETIA) 10 MG tablet Take 1 tablet (10 mg total) by mouth at bedtime. 90 tablet 3   ferrous sulfate 325 (65 FE) MG EC tablet Take 1 tablet (325 mg total) by mouth daily.     furosemide (LASIX) 20 MG tablet Take 1 tablet (20 mg  total) by mouth daily. 30 tablet 3   hydrALAZINE (APRESOLINE) 50 MG tablet TAKE 1 TABLET BY MOUTH THREE TIMES DAILY AS NEEDED FOR BLOOD PRESSURE GREATER THAN 150 270 tablet 0   labetalol (NORMODYNE) 300 MG tablet Take 1 tablet (300 mg total) by mouth 2 (two) times daily. For hypertension 180 tablet 1   losartan (COZAAR) 50 MG tablet Take 1 tablet (50 mg total) by mouth at bedtime. 90 tablet 1   nortriptyline (PAMELOR) 10 MG capsule Take 1 capsule (10 mg total) by mouth 2 (two) times daily. For chronic headaches 180 capsule 3   pantoprazole (PROTONIX) 40 MG tablet One tablet upon waking,  take 30 minuts prior to eating 90 tablet 3   Vibegron (GEMTESA) 75 MG TABS Take 1 tablet (75 mg total) by mouth daily.     vitamin C (ASCORBIC ACID) 250 MG tablet Take 1 tablet (250 mg total) by mouth daily. 90 tablet 1   ranolazine (RANEXA) 500 MG 12 hr tablet Take 1 tablet (500 mg total) by mouth 2 (two) times daily. 60 tablet 3   buprenorphine (BUTRANS) 7.5 MCG/HR Place 1 patch onto the skin once a week for 28 days. (Patient not taking: Reported on 01/03/2024) 4 patch 0   No facility-administered medications prior to  visit.    Review of Systems;  Patient denies headache, fevers, malaise, unintentional weight loss, skin rash, eye pain, sinus congestion and sinus pain, sore throat, dysphagia,  hemoptysis , cough, dyspnea, wheezing, chest pain, palpitations, orthopnea, edema, abdominal pain, nausea, melena, diarrhea, constipation, flank pain, dysuria, hematuria, urinary  Frequency, nocturia, numbness, tingling, seizures,  Focal weakness, Loss of consciousness,  Tremor, insomnia, depression, anxiety, and suicidal ideation.      Objective:  BP 114/62   Pulse 75   Ht 5' (1.524 m)   Wt 165 lb 9.6 oz (75.1 kg)   SpO2 99%   BMI 32.34 kg/m   BP Readings from Last 3 Encounters:  01/03/24 114/62  12/27/23 (!) 139/55  11/28/23 (!) 175/74    Wt Readings from Last 3 Encounters:  01/03/24 165 lb 9.6 oz (75.1  kg)  12/27/23 170 lb (77.1 kg)  11/28/23 164 lb 4.8 oz (74.5 kg)    Physical Exam Vitals reviewed.  Constitutional:      General: She is not in acute distress.    Appearance: Normal appearance. She is normal weight. She is not ill-appearing, toxic-appearing or diaphoretic.  HENT:     Head: Normocephalic.  Eyes:     General: No scleral icterus.       Right eye: No discharge.        Left eye: No discharge.     Conjunctiva/sclera: Conjunctivae normal.  Cardiovascular:     Rate and Rhythm: Normal rate and regular rhythm.     Heart sounds: Normal heart sounds.  Pulmonary:     Effort: Pulmonary effort is normal. No respiratory distress.     Breath sounds: Normal breath sounds.  Musculoskeletal:        General: Normal range of motion.  Skin:    General: Skin is warm and dry.     Findings: Rash present. Rash is macular.       Neurological:     General: No focal deficit present.     Mental Status: She is alert and oriented to person, place, and time. Mental status is at baseline.  Psychiatric:        Mood and Affect: Mood normal.        Behavior: Behavior normal.        Thought Content: Thought content normal.        Judgment: Judgment normal.    Lab Results  Component Value Date   HGBA1C 5.9 07/19/2016   HGBA1C 5.8 06/02/2015    Lab Results  Component Value Date   CREATININE 1.70 (H) 11/10/2023   CREATININE 1.50 (H) 08/25/2023   CREATININE 1.70 (H) 05/04/2023    Lab Results  Component Value Date   WBC 7.1 11/10/2023   HGB 10.3 (L) 11/10/2023   HCT 31.3 (L) 11/10/2023   PLT 262 11/10/2023   GLUCOSE 102 (H) 11/10/2023   CHOL 187 04/26/2023   TRIG 51.0 04/26/2023   HDL 61.00 04/26/2023   LDLDIRECT 107.0 04/26/2023   LDLCALC 116 (H) 04/26/2023   ALT 11 11/10/2023   AST 17 11/10/2023   NA 135 11/10/2023   K 4.1 11/10/2023   CL 101 11/10/2023   CREATININE 1.70 (H) 11/10/2023   BUN 24 (H) 11/10/2023   CO2 24 11/10/2023   TSH 3.84 10/28/2023   INR 1.3 (H)  03/31/2022   HGBA1C 5.9 07/19/2016    VAS US AORTA/IVC/ILIACS Result Date: 08/05/2023  Assessment & Plan:  .Hypertrophic lichen planus Assessment & Plan: Suggested by exam.  All areas  have become lichenified and hyperpigmented.  High potency steroid cream ,, hydroxyzine prescribed, ad dermatology referral In progress   Orders: -     Ambulatory referral to Dermatology  Encounter for screening mammogram for malignant neoplasm of breast -     3D Screening Mammogram, Left and Right; Future  Postmenopausal estrogen deficiency -     DG Bone Density; Future  Other orders -     Ranolazine ER; Take 1 tablet (500 mg total) by mouth 2 (two) times daily.  Dispense: 60 tablet; Refill: 3 -     hydrOXYzine Pamoate; Take 1 capsule (25 mg total) by mouth every 6 (six) hours as needed.  Dispense: 60 capsule; Refill: 0 -     Betamethasone Dipropionate; Apply topically 2 (two) times daily. To affected area  Dispense: 45 g; Refill: 0     I spent 34 minutes on the day of this face to face encounter reviewing patient's  most recent visit with cardiology,  nephrology,  and neurology,  prior relevant surgical and non surgical procedures, recent  labs and imaging studies, counseling on weight management,  reviewing the assessment and plan with patient, and post visit ordering and reviewing of  diagnostics and therapeutics with patient  .   Follow-up: No follow-ups on file.   Sherlene Shams, MD

## 2024-01-09 ENCOUNTER — Ambulatory Visit (INDEPENDENT_AMBULATORY_CARE_PROVIDER_SITE_OTHER): Payer: Medicare Other | Admitting: Urology

## 2024-01-09 DIAGNOSIS — R32 Unspecified urinary incontinence: Secondary | ICD-10-CM | POA: Diagnosis not present

## 2024-01-09 MED ORDER — GEMTESA 75 MG PO TABS: 1.0000 | ORAL_TABLET | Freq: Every day | ORAL | 11 refills | Status: DC

## 2024-01-09 MED ORDER — LIDOCAINE HCL URETHRAL/MUCOSAL 2 % EX GEL: 1.0000 | Freq: Once | CUTANEOUS | Status: DC

## 2024-01-09 NOTE — Progress Notes (Signed)
01/09/2024 11:00 AM   Sabrina [name] [birth year] [id number]  Referring provider: Sherlene Shams, MD [address] [place],  [place] [address]  Chief Complaint  Patient presents with   Cysto    HPI: I was consulted to assist the patient is urinary incontinence.  She has urge incontinence and no voiding.  Sometimes she might leak a small amount with coughing sneezing.  She wears 2 pads a day moderately wet   She voids every 3 hours and every 2 hours at night.  Her flow was good especially when she drinks a lot of fluids.   She thinks she may have had some sort of surgery or stent in her bladder years ago.  She has had a hysterectomy.  On further review it looks like she had a cystoscopy and bilateral stents in 2023 but I did not see it in the local medical record.  Since that time she had a normal renal ultrasound with no stents or hydronephrosis.  She has had a lot of vascular issues.    I would like to see the patient back on Gemtesa samples and prescription for pelvic exam and cystoscopy in about 6 weeks. She has milder frequency and nocturia. She primarily has urge incontinence. I do not think she needs urodynamics currently   TOday Frequency stable.  Last culture negative Patient no longer leaking is very pleased.  Still has some urgency.  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 b.) TTE 11/16/2021: EF >55%; mild AS (MPG 9 mmHg)   CAD (coronary artery disease)    a.) s/p 3v CABG 03/29/2000   Cardiac arrest [facility])     Carotid artery stenosis    a.) s/p LEFT CEA 09/09/2003. b.) Carotid doppler 66/03/3015: 1-39% LICA, CTO RICA; subclavian stenosis   Cataracts, bilateral    Cervical spondylosis without myelopathy    Chronic diastolic CHF (congestive heart failure), NYHA class 3 (HCC)    a.) TTE 05/27/2016: EF >55%, mild LA enlargement, triv PR, mild MR, mod TR; G3DD. b.) TTE 12/12/2017: EF >55%, mild LVH, BAE, mild MR/PR, mod TR; RVSP 52.8 mmHg. c.) TTE 03/18/2020: EF >55%, BAD, AS (MPG 8.7 mmHg); triv MR, mild TR/PR. d.) TTE 11/16/2021: EF >55%, LVH, G1DD, triv MR, mild PR, mod TR; AS (MPG 9 mmHg); MS (MPG 5 mmHg)   Chronic kidney disease, stage III (moderate) (HCC)    Chronic narcotic use 06/24/2014   Chronic pain syndrome    GERD (gastroesophageal reflux disease)    History of 2019 novel coronavirus disease (COVID-19) 09/22/2021   Hyperlipidemia    Hypertension    Long term current use of antithrombotics/antiplatelets    a.) on daily DAPT therapy (ASA + clopidogrel)   Lumbar stenosis with neurogenic claudication    Mitral stenosis 11/16/2021   a.) TTE 11/16/2021: EF >55%; mod MS (MPG 5 mmHg)   Osteoarthritis of hip    Postoperative wound infection 03/29/2022   Pulmonary hypertension (HCC) 12/12/2017   a.) TTE 12/12/2017: mild; RVSP 52.8 mmHg  PVD (peripheral vascular disease) (HCC)    Renal artery stenosis (HCC)    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    Surgical History: Past Surgical History:  Procedure Laterality Date   ABDOMINAL AORTOGRAM W/LOWER EXTREMITY N/A 10/06/2022   Procedure: ABDOMINAL AORTOGRAM W/LOWER EXTREMITY;  Surgeon: Victorino Sparrow, MD;  Location: [facility] INVASIVE CV LAB;  Service: Cardiovascular;  Laterality: N/A;   ABDOMINAL AORTOGRAM W/LOWER EXTREMITY N/A  05/04/2023   Procedure: ABDOMINAL AORTOGRAM W/LOWER EXTREMITY;  Surgeon: Victorino Sparrow, MD;  Location: [facility] INVASIVE CV LAB;  Service: Cardiovascular;  Laterality: N/A;   ABDOMINAL HYSTERECTOMY  1976   CAROTID ARTERY ANGIOPLASTY Left    CAROTID ENDARTERECTOMY Left 09/09/2003   Procedure: CAROTID ENDARTERECTOMY; Location: [facility]; Surgeon: Magda Bernheim, MD   COLONOSCOPY WITH PROPOFOL N/A 08/16/2017   Procedure: COLONOSCOPY WITH PROPOFOL;  Surgeon: Midge Minium, MD;  Location: [facility];  Service: Endoscopy;  Laterality: N/A;   CORONARY ANGIOPLASTY WITH STENT PLACEMENT  2000   CORONARY ARTERY BYPASS GRAFT N/A 03/29/2000   Procedure: 3v CORONARY ARTERY BYPASS GRAFT; Location: [facility]   CYSTOSCOPY WITH STENT PLACEMENT Bilateral    ENDARTERECTOMY FEMORAL Bilateral 03/17/2022   Procedure: ENDARTERECTOMY FEMORAL ( BILATERAL SFA STENT);  Surgeon: Renford Dills, MD;  Location: [facility] ORS;  Service: Vascular;  Laterality: Bilateral;   ENDARTERECTOMY FEMORAL Right 03/30/2022   Procedure: RE-EXPOSURE OF RIGHT COMMON FEMORAL ARTERY, RE-DO RIGHT FEMORAL ENDARTERECTOMY WITH VEIN PATCH;  Surgeon: Victorino Sparrow, MD;  Location: [facility] OR;  Service: Vascular;  Laterality: Right;  WOUND VAC   ESOPHAGOGASTRODUODENOSCOPY (EGD) WITH PROPOFOL N/A 08/16/2017   Procedure: ESOPHAGOGASTRODUODENOSCOPY (EGD) WITH PROPOFOL;  Surgeon: Midge Minium, MD;  Location: [facility];  Service: Endoscopy;  Laterality: N/A;   ESOPHAGOGASTRODUODENOSCOPY (EGD) WITH PROPOFOL N/A 06/29/2018   Procedure: ESOPHAGOGASTRODUODENOSCOPY (EGD) WITH PROPOFOL;  Surgeon: Pasty Spillers, MD;  Location: [facility];  Service: Endoscopy;  Laterality: N/A;   GROIN DEBRIDEMENT Right 03/30/2022   Procedure: RIGHT GROIN IRRIGATION & DEBRIDEMENT;  Surgeon: Victorino Sparrow, MD;  Location: [facility] OR;  Service: Vascular;  Laterality: Right;   INSERTION OF ILIAC STENT Left 03/17/2022   Procedure: INSERTION OF ILIAC STENT;  Surgeon: Renford Dills, MD;   Location: [facility];  Service: Vascular;  Laterality: Left;   LAPAROSCOPIC CHOLECYSTECTOMY Left 10/26/1999   Procedure: LAPAROSCOPIC CHOLECYSTECTOMY; Location: [facility]; Surgeon: Renda Rolls, MD   LEFT HEART CATH AND CORONARY ANGIOGRAPHY N/A 11/18/2021   Procedure: LEFT HEART CATH AND CORONARY ANGIOGRAPHY;  Surgeon: Alwyn Pea, MD;  Location: [facility] INVASIVE CV LAB;  Service: Cardiovascular;  Laterality: N/A;   LEFT HEART CATH AND CORS/GRAFTS ANGIOGRAPHY Left 11/19/2002   Procedure: LEFT HEART CATH AND CORS/GRAFTS ANGIOGRAPHY; Location: [facility]; Surgeon: Rudean Hitt, MD   LEFT HEART CATH AND CORS/GRAFTS ANGIOGRAPHY Left 09/17/2003   Procedure: LEFT HEART CATH AND CORS/GRAFTS ANGIOGRAPHY; Location: [facility]; Surgeon: Rudean Hitt, MD   LOWER EXTREMITY ANGIOGRAPHY Right 01/06/2022   Procedure: Lower Extremity Angiography;  Surgeon: Renford Dills, MD;  Location: [facility] INVASIVE CV LAB;  Service: Cardiovascular;  Laterality: Right;   PERIPHERAL VASCULAR BALLOON ANGIOPLASTY  10/06/2022   Procedure: PERIPHERAL VASCULAR BALLOON ANGIOPLASTY;  Surgeon: Victorino Sparrow, MD;  Location: [facility] INVASIVE CV LAB;  Service: Cardiovascular;;  left iliac and  bilateral renal   RENAL ARTERY ANGIOPLASTY Bilateral 12/2013   TOE AMPUTATION Right    small toe   TONSILLECTOMY AND ADENOIDECTOMY     TOTAL HIP ARTHROPLASTY Left 2005   TOTAL HIP ARTHROPLASTY Right 2015   UPPER EXTREMITY ANGIOGRAPHY Left 11/23/2021   Procedure: UPPER EXTREMITY ANGIOGRAPHY;  Surgeon: Annice Needy, MD;  Location: [facility] INVASIVE CV LAB;  Service: Cardiovascular;  Laterality: Left;    Home Medications:  Allergies as of 01/09/2024       Reactions   Celexa [citalopram] Anaphylaxis   Throat closing    Dilaudid [hydromorphone Hcl] Nausea And Vomiting   Effient [prasugrel] Itching   Hydrochlorothiazide Other (See Comments)   Decreased GFR (Nov 2015)   Liothyronine    Hair fell out, caused headaches    Nsaids    CKD stage III - avoid  nephrotoxic drugs   Nubain [nalbuphine Hcl]    Burning sensation in back   Penicillins Itching   Statins Itching        Medication List        Accurate as of January 09, 2024 11:00 AM. If you have any questions, ask your nurse or doctor.          STOP taking these medications    azelastine 0.1 % nasal spray Commonly known as: ASTELIN       TAKE these medications    amLODipine 10 MG tablet Commonly known as: NORVASC Take 1 tablet (10 mg total) by mouth at bedtime.   aspirin 81 MG tablet Take 81 mg by mouth in the morning.   betamethasone dipropionate 0.05 % cream Apply topically 2 (two) times daily. To affected area   buprenorphine 5 MCG/HR Ptwk Commonly known as: Butrans Place 1 patch onto the skin once a week for 28 days.   buprenorphine 7.5 MCG/HR Commonly known as: Butrans Place 1 patch onto the skin once a week for 28 days.   clopidogrel 75 MG tablet Commonly known as: PLAVIX Take 1 tablet (75 mg total) by mouth at bedtime. To prevent strokes   DULoxetine 20 MG capsule Commonly known as: Cymbalta Take 1 capsule (20 mg total) by mouth daily.   ezetimibe 10 MG tablet Commonly known as: ZETIA Take 1 tablet (10 mg total) by mouth at bedtime.   ferrous sulfate 325 (65 FE) MG EC tablet Take 1 tablet (325 mg total) by mouth daily.   furosemide 20 MG tablet Commonly known as: LASIX Take 1 tablet (20 mg total) by mouth daily.   Gemtesa 75 MG Tabs Generic drug: Vibegron Take 1 tablet (75 mg total) by mouth daily.   hydrALAZINE 50 MG tablet Commonly known as: APRESOLINE TAKE 1 TABLET BY MOUTH THREE TIMES DAILY AS NEEDED FOR BLOOD PRESSURE GREATER THAN 150   hydrOXYzine 25 MG capsule Commonly known as: VISTARIL Take 1 capsule (25 mg total) by mouth every 6 (six) hours as needed.   labetalol 300 MG tablet Commonly known as: NORMODYNE Take 1 tablet (300 mg total) by mouth 2 (two) times daily. For hypertension   losartan 50 MG tablet Commonly  known as: COZAAR Take 1 tablet (50 mg total) by mouth at bedtime.   nortriptyline 10 MG capsule Commonly known as: PAMELOR Take 1 capsule (10 mg total) by mouth 2 (two) times daily. For chronic headaches   pantoprazole 40 MG tablet Commonly known as: PROTONIX One tablet upon waking,  take 30 minuts prior to eating   ranolazine 500 MG 12 hr tablet Commonly known  as: RANEXA Take 1 tablet (500 mg total) by mouth 2 (two) times daily.   vitamin C 250 MG tablet Commonly known as: ASCORBIC ACID Take 1 tablet (250 mg total) by mouth daily.        Allergies:  Allergies  Allergen Reactions   Celexa [Citalopram] Anaphylaxis    Throat closing    Dilaudid [Hydromorphone Hcl] Nausea And Vomiting   Effient [Prasugrel] Itching   Hydrochlorothiazide Other (See Comments)    Decreased GFR (Nov 2015)   Liothyronine     Hair fell out, caused headaches    Nsaids     CKD stage III - avoid nephrotoxic drugs   Nubain [Nalbuphine Hcl]     Burning sensation in back   Penicillins Itching   Statins Itching    Family History: Family History  Problem Relation Age of Onset   Stroke Mother    Hypertension Mother    Diabetes Mother    Hypertension Father    Heart disease Sister        MI   Multiple sclerosis Daughter    Multiple sclerosis Son    Cerebral aneurysm Son    Seizures Son    Cerebral aneurysm Son    Breast cancer Paternal Aunt 47    Social History:  reports that she quit smoking about 25 years ago. Her smoking use included cigarettes. She has been exposed to tobacco smoke. She has never used smokeless tobacco. She reports that she does not drink alcohol and does not use drugs.  ROS:                                        Physical Exam: There were no vitals taken for this visit.  Constitutional:  Alert and oriented, No acute distress. HEENT: [place] AT, moist mucus membranes.  Trachea midline, no masses.   Laboratory Data: Lab Results  Component  Value Date   WBC 7.1 11/10/2023   HGB 10.3 (L) 11/10/2023   HCT 31.3 (L) 11/10/2023   MCV 89.2 11/10/2023   PLT 262 11/10/2023    Lab Results  Component Value Date   CREATININE 1.70 (H) 11/10/2023    No results found for: "PSA"  No results found for: "TESTOSTERONE"  Lab Results  Component Value Date   HGBA1C 5.9 07/19/2016    Urinalysis    Component Value Date/Time   COLORURINE STRAW (A) 06/13/2021 0034   APPEARANCEUR Hazy (A) 11/07/2023 1354   LABSPEC 1.004 (L) 06/13/2021 0034   LABSPEC 1.006 03/27/2014 0944   PHURINE 6.0 06/13/2021 0034   GLUCOSEU Negative 11/07/2023 1354   GLUCOSEU 50 mg/dL 30/86/5784 6962   HGBUR SMALL (A) 06/13/2021 0034   BILIRUBINUR Negative 11/07/2023 1354   BILIRUBINUR Negative 03/27/2014 0944   KETONESUR NEGATIVE 06/13/2021 0034   PROTEINUR 2+ (A) 11/07/2023 1354   PROTEINUR 30 (A) 06/13/2021 0034   NITRITE Negative 11/07/2023 1354   NITRITE NEGATIVE 06/13/2021 0034   LEUKOCYTESUR Negative 11/07/2023 1354   LEUKOCYTESUR NEGATIVE 06/13/2021 0034   LEUKOCYTESUR Negative 03/27/2014 0944    Pertinent Imaging: Urine negative  Assessment & Plan: Reassess 4 months durability on Gemtesa 30 x 11 sent to pharmacy  1. Urinary incontinence, unspecified type (Primary)  - Urinalysis, Complete - lidocaine (XYLOCAINE) 2 % jelly 1 Application   No follow-ups on file.  Martina Sinner, MD  [facility] [address] [place], [place]  [address] [phone]  

## 2024-01-10 LAB — URINALYSIS, COMPLETE
Bilirubin, UA: NEGATIVE
Glucose, UA: NEGATIVE
Ketones, UA: NEGATIVE
Leukocytes,UA: NEGATIVE
Nitrite, UA: NEGATIVE
Specific Gravity, UA: 1.02 (ref 1.005–1.030)
Urobilinogen, Ur: 0.2 mg/dL (ref 0.2–1.0)
pH, UA: 6 (ref 5.0–7.5)

## 2024-01-10 LAB — MICROSCOPIC EXAMINATION

## 2024-01-13 ENCOUNTER — Encounter: Payer: Self-pay | Admitting: Internal Medicine

## 2024-01-18 ENCOUNTER — Ambulatory Visit
Admission: RE | Admit: 2024-01-18 | Discharge: 2024-01-18 | Disposition: A | Discharge status: Home / Self Care | Source: Ambulatory Visit | Attending: Internal Medicine | Admitting: Internal Medicine

## 2024-01-18 DIAGNOSIS — Z1231 Encounter for screening mammogram for malignant neoplasm of breast: Secondary | ICD-10-CM | POA: Insufficient documentation

## 2024-01-25 ENCOUNTER — Other Ambulatory Visit: Payer: Self-pay

## 2024-01-25 MED ORDER — MIRABEGRON ER 50 MG PO TB24: 50.0000 mg | ORAL_TABLET | Freq: Every day | ORAL | 11 refills | Status: DC

## 2024-01-26 DIAGNOSIS — Z951 Presence of aortocoronary bypass graft: Secondary | ICD-10-CM | POA: Diagnosis not present

## 2024-01-26 DIAGNOSIS — I5032 Chronic diastolic (congestive) heart failure: Secondary | ICD-10-CM | POA: Diagnosis not present

## 2024-01-26 DIAGNOSIS — E782 Mixed hyperlipidemia: Secondary | ICD-10-CM | POA: Diagnosis not present

## 2024-01-26 DIAGNOSIS — I050 Rheumatic mitral stenosis: Secondary | ICD-10-CM | POA: Diagnosis not present

## 2024-01-26 DIAGNOSIS — I2510 Atherosclerotic heart disease of native coronary artery without angina pectoris: Secondary | ICD-10-CM | POA: Diagnosis not present

## 2024-01-26 DIAGNOSIS — I10 Essential (primary) hypertension: Secondary | ICD-10-CM | POA: Diagnosis not present

## 2024-01-26 DIAGNOSIS — I739 Peripheral vascular disease, unspecified: Secondary | ICD-10-CM | POA: Diagnosis not present

## 2024-01-26 DIAGNOSIS — K219 Gastro-esophageal reflux disease without esophagitis: Secondary | ICD-10-CM | POA: Diagnosis not present

## 2024-01-26 DIAGNOSIS — R0602 Shortness of breath: Secondary | ICD-10-CM | POA: Diagnosis not present

## 2024-01-26 DIAGNOSIS — I350 Nonrheumatic aortic (valve) stenosis: Secondary | ICD-10-CM | POA: Diagnosis not present

## 2024-02-01 ENCOUNTER — Other Ambulatory Visit: Payer: Self-pay | Admitting: Internal Medicine

## 2024-02-01 DIAGNOSIS — I10 Essential (primary) hypertension: Secondary | ICD-10-CM

## 2024-02-09 ENCOUNTER — Telehealth: Payer: Self-pay

## 2024-02-09 DIAGNOSIS — L430 Hypertrophic lichen planus: Secondary | ICD-10-CM

## 2024-02-09 NOTE — Telephone Encounter (Signed)
 Error

## 2024-02-09 NOTE — Telephone Encounter (Signed)
 Pt was referred to [facility] at last appt and they are not accepting any new patients at this time. I have pended a referral for [facility] for your approval.

## 2024-02-09 NOTE — Telephone Encounter (Signed)
 Copied from CRM [id number]. Topic: General - Other >> Feb 09, 2024  3:49 PM Adrionna Y wrote: Reason for CRM: Patient would like to know the steps for a referral to a dermatologist shes having skin issues Would like a call immediately if possible  Patient states she has seen Dr. Creta Dolin a couple of times for a rash that spreads and the cream is not working.  Patient states she would like for us  to refer her to a dermatologist.

## 2024-02-13 ENCOUNTER — Telehealth: Payer: Self-pay

## 2024-02-13 NOTE — Telephone Encounter (Signed)
 noted

## 2024-02-13 NOTE — Telephone Encounter (Signed)
 Copied from CRM [id number]. Topic: General - Other >> Feb 09, 2024  3:49 PM Adrionna Y wrote: Reason for CRM: Patient would like to know the steps for a referral to a dermatologist shes having skin issues Would like a call immediately if possible  I spoke with patient and let her know that Dr. Creta Dolin has referred her to Dr. Lehman Pummel at [facility].  I let her know that the referral has been sent and I gave her their number so she can call their office to schedule an appointment.

## 2024-02-14 ENCOUNTER — Encounter: Payer: Self-pay | Admitting: Student in an Organized Health Care Education/Training Program

## 2024-02-14 ENCOUNTER — Ambulatory Visit
Attending: Student in an Organized Health Care Education/Training Program | Admitting: Student in an Organized Health Care Education/Training Program

## 2024-02-14 VITALS — BP 126/57 | HR 58 | Temp 97.5°F | Resp 18 | Ht 60.0 in | Wt 162.0 lb

## 2024-02-14 DIAGNOSIS — M5416 Radiculopathy, lumbar region: Secondary | ICD-10-CM | POA: Diagnosis not present

## 2024-02-14 DIAGNOSIS — M47816 Spondylosis without myelopathy or radiculopathy, lumbar region: Secondary | ICD-10-CM | POA: Diagnosis not present

## 2024-02-14 DIAGNOSIS — G894 Chronic pain syndrome: Secondary | ICD-10-CM | POA: Diagnosis not present

## 2024-02-14 DIAGNOSIS — G8929 Other chronic pain: Secondary | ICD-10-CM | POA: Diagnosis not present

## 2024-02-14 DIAGNOSIS — Z96643 Presence of artificial hip joint, bilateral: Secondary | ICD-10-CM

## 2024-02-14 DIAGNOSIS — I739 Peripheral vascular disease, unspecified: Secondary | ICD-10-CM | POA: Diagnosis not present

## 2024-02-14 MED ORDER — BUPRENORPHINE 10 MCG/HR TD PTWK: 1.0000 | MEDICATED_PATCH | TRANSDERMAL | 2 refills | Status: DC

## 2024-02-14 NOTE — Progress Notes (Signed)
 PROVIDER NOTE: Information contained herein reflects review and annotations entered in association with encounter. Interpretation of such information and data should be left to medically-trained personnel. Information provided to patient can be located elsewhere in the medical record under "Patient Instructions". Document created using STT-dictation technology, any transcriptional errors that may result from process are unintentional.    Patient: Sabrina [name]  Service Category: E/M  Provider: Cephus Collin, MD  DOB: 10-Dec-1940  DOS: 02/14/2024  Referring Provider: Thersia Flax, MD  MRN: [id number]  Specialty: Interventional Pain Management  PCP: Thersia Flax, MD  Type: Established Patient  Setting: Ambulatory outpatient    Location: Office  Delivery: Face-to-face     HPI  Sabrina [name], a 83 y.o. year old female, is here today because of her Lumbar facet arthropathy [M47.816]. [name] primary complain today is Back Pain (lower)  Pertinent problems: [name] has Hip pain, bilateral; Peripheral vascular disease (HCC); Generalized anxiety disorder; Chronic right hip pain; Cervical spondylosis without myelopathy; Chronic radicular lumbar pain; Lumbar stenosis with neurogenic claudication; Osteoarthritis of right hip; Chronic pain syndrome; and Lumbar facet arthropathy on their pertinent problem list. Pain Assessment: Severity of Chronic pain is reported as a 10-Worst pain ever/10. Location: Back  /denies. Onset: More than a month ago. Quality: Constant. Timing: Constant. Modifying factor(s): sitting, Butrans . Vitals:  height is 5' (1.524 m) and weight is 162 lb (73.5 kg). Her temporal temperature is 97.5 F (36.4 C) (abnormal). Her blood pressure is 126/57 (abnormal) and her pulse is 58 (abnormal). Her respiration is 18 and oxygen saturation is 98%.  BMI: Estimated body mass index is 31.64 kg/m as calculated from the following:   Height as of this encounter: 5' (1.524 m).    Weight as of this encounter: 162 lb (73.5 kg). Last encounter: 07/21/2023. Last procedure: Visit date not found.  Reason for encounter: medication management.   Patient follows up today for medication management.  She is endorsing analgesic and functional benefit with her Butrans  patch that we increased from 5 to 7.5 mcg an hour.  No side effects of sedation, constipation, nausea, mood changes.  We discussed increasing her patch from optimize pain relief to 10 mcg an hour.  Risks and benefits reviewed and patient would like to proceed.   09/13/23 The patient, with a history of chronic lower back pain, hip pain, and groin pain, presents for a follow-up visit. The pain is primarily in the lower back, followed by the hip and groin. The patient reports that the pain is exacerbated when lifting their legs, particularly when bending. The pain also extends to the front of their thighs, but only when lifting their legs.The patient has had a limited response to previous injections and has not found relief from the muscle relaxer, tizanidine  (Zanaflex ). They have been taking gabapentin  300mg  at bedtime for chronic pain management. The patient also has a history of kidney disease, which is reportedly stable according to a recent consultation with a nephrologist. They are on Plavix , an antiplatelet medication, and Cymbalta , an antidepressant also used for chronic pain management. The patient denies any relief from the muscle relaxer, Zanaflex . Despite these ongoing treatments, the patient's pain persists, impacting their daily activities and quality of life.  HPI from last visit: Sabrina [name] is being evaluated for possible interventional pain management therapies for the treatment of her chronic pain.  Patient is a pleasant 83 year old female who presents with a chief complaint of low back pain as well as left  anterior thigh pain and muscle spasms.  She states that the left anterior thigh pain has gotten worse.   She does have a history of peripheral arterial disease and iliac stents in place.  She feels that her left thigh pain may be related to a complication after her iliac stent procedure.  Of note, the patient has see me in the past, over 3 years ago for similar low back pain.  This was related to lumbar facet arthropathy and spondylosis.  She went on to have 2 diagnostic lumbar facet medial branch nerve blocks which unfortunately were not helpful.  Since then, she has been seeing neurology.  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  She is here to discuss options for chronic pain management.   ROS  Constitutional: Denies any fever or chills Gastrointestinal: No reported hemesis, hematochezia, vomiting, or acute GI distress Musculoskeletal:  As above Neurological: No reported episodes of acute onset apraxia, aphasia, dysarthria, agnosia, amnesia, paralysis, loss of coordination, or loss of consciousness  Medication Review  DULoxetine , amLODipine , aspirin , betamethasone  dipropionate, buprenorphine , clopidogrel , ezetimibe , ferrous sulfate , furosemide , hydrALAZINE , hydrOXYzine , labetalol , losartan , mirabegron  ER, nortriptyline , pantoprazole , ranolazine , and vitamin C   History Review  Allergy: [name] is allergic to celexa  [citalopram ], dilaudid  [hydromorphone  hcl], effient [prasugrel], hydrochlorothiazide , liothyronine , nsaids, nubain [nalbuphine hcl], penicillins, and statins. Drug: [name]  reports no history of drug use. Alcohol:  reports no history of alcohol use. Tobacco:  reports that she quit smoking about 25 years ago. Her smoking use included cigarettes. She has been exposed to tobacco smoke. She has never used smokeless tobacco. Social: [name]  reports that she quit smoking about 25 years ago. Her smoking use included cigarettes. She has been exposed to tobacco smoke. She has never used smokeless tobacco. She  reports that she does not drink alcohol and does not use drugs. Medical:  has a past medical history of Abdominal aortic ectasia (HCC) (07/13/2017), Amputation of fifth toe, right, traumatic, subsequent encounter (HCC) (06/18/2019), Anemia of chronic kidney failure, Anxiety, Aortic stenosis (03/18/2020), CAD (coronary artery disease), Cardiac arrest [facility]), Carotid artery stenosis, Cataracts, bilateral, Cervical spondylosis without myelopathy, Chronic diastolic CHF (congestive heart failure), NYHA class 3 (HCC), Chronic kidney disease, stage III (moderate) (HCC), Chronic narcotic use (06/24/2014), Chronic pain syndrome, GERD (gastroesophageal reflux disease), History of 2019 novel coronavirus disease (COVID-19) (09/22/2021), Hyperlipidemia, Hypertension, Long term current use of antithrombotics/antiplatelets, Lumbar stenosis with neurogenic claudication, Mitral stenosis (11/16/2021), Osteoarthritis of hip, Postoperative wound infection (03/29/2022), Pulmonary hypertension (HCC) (12/12/2017), PVD (peripheral vascular disease) (HCC), Renal artery stenosis (HCC), 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 Surgical: Ms. Kluge  has a past surgical history that includes Laparoscopic cholecystectomy (Left, 10/26/1999); Tonsillectomy and adenoidectomy; Abdominal hysterectomy (1976); Total hip arthroplasty (Left, 2005); Carotid angioplasty (Left); Coronary angioplasty with stent (2000); Toe amputation (Right); Cystoscopy with stent placement (Bilateral); Total hip arthroplasty (Right, 2015); Coronary artery bypass graft (N/A, 03/29/2000); Carotid endarterectomy (Left, 09/09/2003); Renal artery angioplasty (Bilateral, 12/2013); Colonoscopy with propofol  (N/A, 08/16/2017); Esophagogastroduodenoscopy (egd) with propofol  (N/A, 08/16/2017); Esophagogastroduodenoscopy (egd) with propofol  (N/A, 06/29/2018); LEFT HEART CATH AND CORONARY  ANGIOGRAPHY (N/A, 11/18/2021); Upper Extremity Angiography (Left, 11/23/2021); Lower Extremity Angiography (Right, 01/06/2022); LEFT HEART CATH AND CORS/GRAFTS ANGIOGRAPHY (Left, 11/19/2002); LEFT HEART CATH AND CORS/GRAFTS ANGIOGRAPHY (Left, 09/17/2003); Endarterectomy femoral (Bilateral, 03/17/2022); Insertion of iliac stent (Left, 03/17/2022); Groin debridement (Right, 03/30/2022); Endarterectomy femoral (Right, 03/30/2022); ABDOMINAL AORTOGRAM W/LOWER  EXTREMITY (N/A, 10/06/2022); PERIPHERAL VASCULAR BALLOON ANGIOPLASTY (10/06/2022); and ABDOMINAL AORTOGRAM W/LOWER EXTREMITY (N/A, 05/04/2023). Family: family history includes Breast cancer (age of onset: 92) in her paternal aunt; Cerebral aneurysm in her son and son; Diabetes in her mother; Heart disease in her sister; Hypertension in her father and mother; Multiple sclerosis in her daughter and son; Seizures in her son; Stroke in her mother.  Laboratory Chemistry Profile   Renal Lab Results  Component Value Date   BUN 24 (H) 11/10/2023   CREATININE 1.70 (H) 11/10/2023   LABCREA 86 03/06/2018   GFR 32.48 (L) 08/25/2023   GFRAA 50 (L) 03/10/2020   GFRNONAA 30 (L) 11/10/2023    Hepatic Lab Results  Component Value Date   AST 17 11/10/2023   ALT 11 11/10/2023   ALBUMIN  3.7 11/10/2023   ALKPHOS 73 11/10/2023   LIPASE 16.0 07/06/2017    Electrolytes Lab Results  Component Value Date   NA 135 11/10/2023   K 4.1 11/10/2023   CL 101 11/10/2023   CALCIUM  9.0 11/10/2023   MG 2.5 (H) 03/31/2022   PHOS 3.8 09/23/2022    Bone No results found for: "VD25OH", "VD125OH2TOT", "NA3557DU2", "GU5427CW2", "25OHVITD1", "25OHVITD2", "25OHVITD3", "TESTOFREE", "TESTOSTERONE"  Inflammation (CRP: Acute Phase) (ESR: Chronic Phase) Lab Results  Component Value Date   CRP <1.0 02/17/2023   ESRSEDRATE 53 (H) 02/17/2023   LATICACIDVEN 1.0 03/30/2022         Note: Above Lab results reviewed.  Recent Imaging Review  MM 3D SCREENING MAMMOGRAM BILATERAL  BREAST CLINICAL DATA:  Screening.  EXAM: DIGITAL SCREENING BILATERAL MAMMOGRAM WITH TOMOSYNTHESIS AND CAD  TECHNIQUE: Bilateral screening digital craniocaudal and mediolateral oblique mammograms were obtained. Bilateral screening digital breast tomosynthesis was performed. The images were evaluated with computer-aided detection.  COMPARISON:  Previous exam(s).  ACR Breast Density Category b: There are scattered areas of fibroglandular density.  FINDINGS: There are no findings suspicious for malignancy.  IMPRESSION: No mammographic evidence of malignancy. A result letter of this screening mammogram will be mailed directly to the patient.  RECOMMENDATION: Screening mammogram in one year. (Code:SM-B-01Y)  BI-RADS CATEGORY  1: Negative.  Electronically Signed   By: Dina  Arceo M.D.   On: 01/20/2024 09:17 Note: Reviewed        Physical Exam  General appearance: Well nourished, well developed, and well hydrated. In no apparent acute distress Mental status: Alert, oriented x 3 (person, place, & time)       Respiratory: No evidence of acute respiratory distress Eyes: PERLA Vitals: BP (!) 126/57 (Cuff Size: Normal)   Pulse (!) 58   Temp (!) 97.5 F (36.4 C) (Temporal)   Resp 18   Ht 5' (1.524 m)   Wt 162 lb (73.5 kg)   SpO2 98%   BMI 31.64 kg/m  BMI: Estimated body mass index is 31.64 kg/m as calculated from the following:   Height as of this encounter: 5' (1.524 m).   Weight as of this encounter: 162 lb (73.5 kg). Ideal: Ideal body weight: 45.5 kg (100 lb 4.9 oz) Adjusted ideal body weight: 56.7 kg (124 lb 15.8 oz)  Assessment   Diagnosis  1. Lumbar facet arthropathy   2. Status post bilateral hip replacements   3. Chronic pain syndrome   4. PAD (peripheral artery disease) (HCC)   5. Chronic radicular lumbar pain       Plan of Care    Pharmacotherapy (Medications Ordered): Meds ordered this encounter  Medications   buprenorphine  (BUTRANS ) 10 MCG/HR  PTWK  Sig: Place 1 patch onto the skin once a week.    Dispense:  4 patch    Refill:  2    Chronic Pain: STOP Act (Not applicable) Fill 1 day early if closed on refill date. Avoid benzodiazepines within 8 hours of opioids   Orders:  No orders of the defined types were placed in this encounter.  Follow-up plan:   Return in about 3 months (around 05/15/2024) for MM, F2F.      Recent Visits Date Type Provider Dept  12/27/23 Office Visit Cephus Collin, MD [facility]  Showing recent visits within past 90 days and meeting all other requirements Today's Visits Date Type Provider Dept  02/14/24 Office Visit Cephus Collin, MD [facility]  Showing today's visits and meeting all other requirements Future Appointments No visits were found meeting these conditions. Showing future appointments within next 90 days and meeting all other requirements  I discussed the assessment and treatment plan with the patient. The patient was provided an opportunity to ask questions and all were answered. The patient agreed with the plan and demonstrated an understanding of the instructions.  Patient advised to call back or seek an in-person evaluation if the symptoms or condition worsens.  Duration of encounter: .  Total time on encounter, as per AMA guidelines included both the face-to-face and non-face-to-face time personally spent by the physician and/or other qualified health care professional(s) on the day of the encounter (includes time in activities that require the physician or other qualified health care professional and does not include time in activities normally performed by clinical staff). Physician's time may include the following activities when performed: Preparing to see the patient (e.g., pre-charting review of records, searching for previously ordered imaging, lab work, and nerve conduction tests) Review of prior analgesic pharmacotherapies. Reviewing  PMP Interpreting ordered tests (e.g., lab work, imaging, nerve conduction tests) Performing post-procedure evaluations, including interpretation of diagnostic procedures Obtaining and/or reviewing separately obtained history Performing a medically appropriate examination and/or evaluation Counseling and educating the patient/family/caregiver Ordering medications, tests, or procedures Referring and communicating with other health care professionals (when not separately reported) Documenting clinical information in the electronic or other health record Independently interpreting results (not separately reported) and communicating results to the patient/ family/caregiver Care coordination (not separately reported)  Note by: Cephus Collin, MD Date: 02/14/2024; Time: 12:02 PM

## 2024-02-15 DIAGNOSIS — L308 Other specified dermatitis: Secondary | ICD-10-CM | POA: Diagnosis not present

## 2024-02-15 DIAGNOSIS — L810 Postinflammatory hyperpigmentation: Secondary | ICD-10-CM | POA: Diagnosis not present

## 2024-02-15 DIAGNOSIS — L309 Dermatitis, unspecified: Secondary | ICD-10-CM | POA: Diagnosis not present

## 2024-02-22 ENCOUNTER — Encounter: Payer: Self-pay | Admitting: Internal Medicine

## 2024-02-22 DIAGNOSIS — G894 Chronic pain syndrome: Secondary | ICD-10-CM

## 2024-02-22 DIAGNOSIS — Z96643 Presence of artificial hip joint, bilateral: Secondary | ICD-10-CM

## 2024-02-22 DIAGNOSIS — M47816 Spondylosis without myelopathy or radiculopathy, lumbar region: Secondary | ICD-10-CM

## 2024-02-23 ENCOUNTER — Other Ambulatory Visit: Payer: Self-pay | Admitting: Internal Medicine

## 2024-02-23 DIAGNOSIS — M47816 Spondylosis without myelopathy or radiculopathy, lumbar region: Secondary | ICD-10-CM

## 2024-02-23 DIAGNOSIS — Z96643 Presence of artificial hip joint, bilateral: Secondary | ICD-10-CM

## 2024-02-23 DIAGNOSIS — G894 Chronic pain syndrome: Secondary | ICD-10-CM

## 2024-02-23 MED ORDER — DULOXETINE HCL 20 MG PO CPEP: 20.0000 mg | ORAL_CAPSULE | Freq: Every day | ORAL | 0 refills | Status: DC

## 2024-02-23 MED ORDER — FUROSEMIDE 20 MG PO TABS: 20.0000 mg | ORAL_TABLET | Freq: Every day | ORAL | 0 refills | Status: DC

## 2024-02-23 MED ORDER — RANOLAZINE ER 500 MG PO TB12: 500.0000 mg | ORAL_TABLET | Freq: Two times a day (BID) | ORAL | 0 refills | Status: DC

## 2024-02-23 MED ORDER — LOSARTAN POTASSIUM 50 MG PO TABS: 50.0000 mg | ORAL_TABLET | Freq: Every day | ORAL | 0 refills | Status: DC

## 2024-02-23 MED ORDER — EZETIMIBE 10 MG PO TABS: 10.0000 mg | ORAL_TABLET | Freq: Every day | ORAL | 0 refills | Status: DC

## 2024-02-23 NOTE — Telephone Encounter (Signed)
 Myrbetriq  has refills, also prescribed by [facility]

## 2024-02-23 NOTE — Telephone Encounter (Signed)
 Copied from CRM [id number]. Topic: Clinical - Medication Refill >> Feb 23, 2024  9:55 AM Freya Jesus wrote: Most Recent Primary Care Visit:  Provider: Thersia Flax  Department: [facility]  Visit Type: ACUTE  Date: 01/03/2024  Medication: DULoxetine  (CYMBALTA ) 20 MG capsule [[id number]] mirabegron  ER (MYRBETRIQ ) 50 MG TB24 tablet [[id number]] ezetimibe  (ZETIA ) 10 MG tablet [[id number]] losartan  (COZAAR ) 50 MG tablet [[id number]] furosemide  (LASIX ) 20 MG tablet [[id number]] ranolazine  (RANEXA ) 500 MG 12 hr tablet [[id number]]  Has the patient contacted their pharmacy? No (Agent: If no, request that the patient contact the pharmacy for the refill. If patient does not wish to contact the pharmacy document the reason why and proceed with request.) (Agent: If yes, when and what did the pharmacy advise?)  Is this the correct pharmacy for this prescription? Yes If no, delete pharmacy and type the correct one.  This is the patient's preferred pharmacy:   [facility] [address] [place], [place] - [address] [place] [address] Phone: [phone] Fax: [phone]   Has the prescription been filled recently? Yes  Is the patient out of the medication? Yes  Has the patient been seen for an appointment in the last year OR does the patient have an upcoming appointment? Yes  Can we respond through MyChart? Yes  Agent: Please be advised that Rx refills may take up to 3 business days. We ask that you follow-up with your pharmacy.

## 2024-02-23 NOTE — Telephone Encounter (Unsigned)
 Copied from CRM [id number]. Topic: Clinical - Medication Refill >> Feb 23, 2024 10:01 AM Albertha Alosa wrote: Most Recent Primary Care Visit:  Provider: Thersia Flax  Department: [facility]  Visit Type: ACUTE  Date: 01/03/2024  Medication: DULoxetine  (CYMBALTA ) 20 MG capsule mirabegron  ER (MYRBETRIQ ) 50 MG TB24 tablet ezetimibe  (ZETIA ) 10 MG tablet  losartan  (COZAAR ) 50 MG tablet  ranolazine  (RANEXA ) 500 MG 12 hr tablet furosemide  (LASIX ) 20 MG tablet    Has the patient contacted their pharmacy? Yes  (Agent: If no, request that the patient contact the pharmacy for the refill. If patient does not wish to contact the pharmacy document the reason why and proceed with request.) (Agent: If yes, when and what did the pharmacy advise?)  Is this the correct pharmacy for this prescription? Yes If no, delete pharmacy and type the correct one.  This is the patient's preferred pharmacy:    [facility] [address] [place], [place] - [address] [place] [address] Phone: [phone] Fax: [phone]   Has the prescription been filled recently? No  Is the patient out of the medication? Yes  Has the patient been seen for an appointment in the last year OR does the patient have an upcoming appointment? Yes  Can we respond through MyChart? Yes  Agent: Please be advised that Rx refills may take up to 3 business days. We ask that you follow-up with your pharmacy.

## 2024-03-21 NOTE — Progress Notes (Deleted)
 Office Note    HPI: Sabrina [name] is a 83 y.o. (14-Apr-1941) female presenting in follow up s/p right groin exploration, redo CFA patch plasty with ipsilateral GSV on 03/30/22.  Prior surgeries were performed at [facility] and include: Right renal artery stenting 4mm left renal artery stenting (6mm) (2015) Left subclavian artery stenting 9mm (2023) Bilateral common iliac artery stenting Bilateral external iliac stenting Left common femoral, profunda femoris, and superficial femoral artery endarterectomies Right common femoral, profunda femoris, and superficial femoral artery endarterectomies Stent placement to left external iliac artery with 8 mm diameter by 4 cm length of life star stent  09/2022 balloon angioplasty of the right EIA, bilateral renal angioplasty  Most recent imaging occurred in July 2023 for elevated velocities in the iliac artery stents.  Angiography demonstrated widely patent stents bilaterally.  On exam today, Sabrina [name] was doing well.  She denied issues in the lower extremities.  No significant claudication, ischemic rest pain, tissue loss.  At her last visit, she stated she was going to go live with her daughter in [place] , but has not made this jump.  She was honest that she was unsure what was holding her back.  No change in renal function on most recent labs   Denies food fear, weight loss.  Denies stroke, TIA, amaurosis symptoms. Denies abdominal pain, back pain.   The pt is  on a daily aspirin .   Other AC:  plavix  The pt is  on medication for hypertension.   The pt is not diabetic.  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 b.) TTE 11/16/2021: EF >55%; mild AS (MPG 9 mmHg)   CAD (coronary artery disease)    a.) s/p 3v CABG 03/29/2000   Cardiac arrest [facility])    Carotid artery stenosis    a.) s/p LEFT CEA 09/09/2003. b.) Carotid doppler 16/07/9603: 1-39% LICA, CTO RICA; subclavian stenosis   Cataracts, bilateral    Cervical spondylosis without myelopathy    Chronic diastolic CHF (congestive heart failure), NYHA class 3 (HCC)    a.) TTE 05/27/2016: EF >55%, mild LA enlargement, triv PR, mild MR, mod TR; G3DD. b.) TTE 12/12/2017: EF >55%, mild LVH, BAE, mild MR/PR, mod TR; RVSP 52.8 mmHg. c.) TTE 03/18/2020: EF >55%, BAD, AS (MPG 8.7 mmHg); triv MR, mild TR/PR. d.) TTE 11/16/2021: EF >55%, LVH, G1DD, triv MR, mild PR, mod TR; AS (MPG 9 mmHg); MS (MPG 5 mmHg)   Chronic kidney disease, stage III (moderate) (HCC)    Chronic narcotic use 06/24/2014   Chronic pain syndrome    GERD (gastroesophageal reflux disease)    History of 2019 novel coronavirus disease (COVID-19) 09/22/2021   Hyperlipidemia    Hypertension    Long term current use of antithrombotics/antiplatelets    a.) on daily DAPT therapy (ASA + clopidogrel )   Lumbar stenosis with neurogenic claudication    Mitral stenosis 11/16/2021   a.) TTE 11/16/2021: EF >55%; mod MS (MPG 5 mmHg)   Osteoarthritis of hip    Postoperative wound infection 03/29/2022   Pulmonary hypertension (  HCC) 12/12/2017   a.) TTE 12/12/2017: mild; RVSP 52.8 mmHg   PVD (peripheral vascular disease) (HCC)    Renal artery stenosis (HCC)    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    Past Surgical History:  Procedure Laterality Date   ABDOMINAL AORTOGRAM W/LOWER EXTREMITY N/A  10/06/2022   Procedure: ABDOMINAL AORTOGRAM W/LOWER EXTREMITY;  Surgeon: Kayla Part, MD;  Location: [facility] INVASIVE CV LAB;  Service: Cardiovascular;  Laterality: N/A;   ABDOMINAL AORTOGRAM W/LOWER EXTREMITY N/A 05/04/2023   Procedure: ABDOMINAL AORTOGRAM W/LOWER EXTREMITY;  Surgeon: Kayla Part, MD;  Location: [facility] INVASIVE CV LAB;  Service: Cardiovascular;  Laterality: N/A;   ABDOMINAL HYSTERECTOMY  1976   CAROTID ARTERY ANGIOPLASTY Left    CAROTID ENDARTERECTOMY Left 09/09/2003   Procedure: CAROTID ENDARTERECTOMY; Location: [facility]; Surgeon: Angie Bari, MD   COLONOSCOPY WITH PROPOFOL  N/A 08/16/2017   Procedure: COLONOSCOPY WITH PROPOFOL ;  Surgeon: Marnee Sink, MD;  Location: [facility] ENDOSCOPY;  Service: Endoscopy;  Laterality: N/A;   CORONARY ANGIOPLASTY WITH STENT PLACEMENT  2000   CORONARY ARTERY BYPASS GRAFT N/A 03/29/2000   Procedure: 3v CORONARY ARTERY BYPASS GRAFT; Location: [facility]   CYSTOSCOPY WITH STENT PLACEMENT Bilateral    ENDARTERECTOMY FEMORAL Bilateral 03/17/2022   Procedure: ENDARTERECTOMY FEMORAL ( BILATERAL SFA STENT);  Surgeon: Jackquelyn Mass, MD;  Location: [facility] ORS;  Service: Vascular;  Laterality: Bilateral;   ENDARTERECTOMY FEMORAL Right 03/30/2022   Procedure: RE-EXPOSURE OF RIGHT COMMON FEMORAL ARTERY, RE-DO RIGHT FEMORAL ENDARTERECTOMY WITH VEIN PATCH;  Surgeon: Kayla Part, MD;  [facility];  Service: Vascular;  Laterality: Right;  WOUND VAC   ESOPHAGOGASTRODUODENOSCOPY (EGD) WITH PROPOFOL  N/A 08/16/2017   Procedure: ESOPHAGOGASTRODUODENOSCOPY (EGD) WITH PROPOFOL ;  Surgeon: Marnee Sink, MD;  Location: [facility] ENDOSCOPY;  Service: Endoscopy;  Laterality: N/A;   ESOPHAGOGASTRODUODENOSCOPY (EGD) WITH PROPOFOL  N/A 06/29/2018   Procedure: ESOPHAGOGASTRODUODENOSCOPY (EGD) WITH PROPOFOL ;  Surgeon: Irby Mannan, MD;  Location: [facility] ENDOSCOPY;  Service: Endoscopy;  Laterality: N/A;   GROIN DEBRIDEMENT Right 03/30/2022   Procedure: RIGHT GROIN IRRIGATION &  DEBRIDEMENT;  Surgeon: Kayla Part, MD;  Location: [facility] OR;  Service: Vascular;  Laterality: Right;   INSERTION OF ILIAC STENT Left 03/17/2022   Procedure: INSERTION OF ILIAC STENT;  Surgeon: Jackquelyn Mass, MD;  Location: [facility] ORS;  Service: Vascular;  Laterality: Left;   LAPAROSCOPIC CHOLECYSTECTOMY Left 10/26/1999   Procedure: LAPAROSCOPIC CHOLECYSTECTOMY; Location: [facility]; Surgeon: Hortensia Ma, MD   LEFT HEART CATH AND CORONARY ANGIOGRAPHY N/A 11/18/2021   Procedure: LEFT HEART CATH AND CORONARY ANGIOGRAPHY;  Surgeon: Antonette Batters, MD;  Location: [facility] INVASIVE CV LAB;  Service: Cardiovascular;  Laterality: N/A;   LEFT HEART CATH AND CORS/GRAFTS ANGIOGRAPHY Left 11/19/2002   Procedure: LEFT HEART CATH AND CORS/GRAFTS ANGIOGRAPHY; Location: [facility]; Surgeon: Thais Fill, MD   LEFT HEART CATH AND CORS/GRAFTS ANGIOGRAPHY Left 09/17/2003   Procedure: LEFT HEART CATH AND CORS/GRAFTS ANGIOGRAPHY; Location: [facility]; Surgeon: Thais Fill, MD   LOWER EXTREMITY ANGIOGRAPHY Right 01/06/2022   Procedure: Lower Extremity Angiography;  Surgeon: Jackquelyn Mass, MD;  Location: [facility] INVASIVE CV LAB;  Service: Cardiovascular;  Laterality: Right;   PERIPHERAL VASCULAR BALLOON ANGIOPLASTY  10/06/2022   Procedure: PERIPHERAL VASCULAR BALLOON ANGIOPLASTY;  Surgeon: Kayla Part, MD;  [facility] CV LAB;  Service: Cardiovascular;;  left iliac and bilateral renal   RENAL ARTERY ANGIOPLASTY Bilateral 12/2013   TOE AMPUTATION Right    small toe   TONSILLECTOMY AND ADENOIDECTOMY     TOTAL HIP ARTHROPLASTY Left 2005   TOTAL HIP ARTHROPLASTY Right 2015   UPPER EXTREMITY ANGIOGRAPHY Left 11/23/2021   Procedure: UPPER EXTREMITY ANGIOGRAPHY;  Surgeon: Celso College, MD;  Location: [facility] INVASIVE CV LAB;  Service: Cardiovascular;  Laterality: Left;    Social History   Socioeconomic History   Marital status: Divorced    Spouse name: Not on file   Number of children: 3   Years of  education: Not on file   Highest education level: Not on file  Occupational History   Occupation: retired  Tobacco Use   Smoking status: Former    Current packs/day: 0.00    Types: Cigarettes    Quit date: 10/25/1998    Years since quitting: 25.4    Passive exposure: Past   Smokeless tobacco: Never  Vaping Use   Vaping status: Never Used  Substance and Sexual Activity   Alcohol use: No    Alcohol/week: 0.0 standard drinks of alcohol   Drug use: Never   Sexual activity: Not Currently  Other Topics Concern   Not on file  Social History Narrative   Daughter Corbin Dess ([place]); 1 in [employer]; 1 in [place]   Lives alone   Social Drivers of Health   Financial Resource Strain: Low Risk  (05/10/2023)   Overall Financial Resource Strain (CARDIA)    Difficulty of Paying Living Expenses: Not hard at all  Food Insecurity: No Food Insecurity (05/10/2023)   Hunger Vital Sign    Worried About Running Out of Food in the Last Year: Never true    Ran Out of Food in the Last Year: Never true  Transportation Needs: No Transportation Needs (05/10/2023)   PRAPARE - Administrator, Civil Service (Medical): No    Lack of Transportation (Non-Medical): No  Physical Activity: Inactive (05/10/2023)   Exercise Vital Sign    Days of Exercise per Week: 0 days    Minutes of Exercise per Session: 0 min  Stress: No Stress Concern Present (05/10/2023)   [employer] of Occupational Health - Occupational Stress Questionnaire    Feeling of Stress : Not at all  Social Connections: Moderately Integrated (05/10/2023)   Social Connection and Isolation Panel [NHANES]    Frequency of Communication with Friends and Family: More than three times a week    Frequency of Social Gatherings with Friends and Family: More than three times a week    Attends Religious Services: More than 4 times per year    Active Member of [employer] or Organizations: Yes    Attends Engineer, structural: More than 4 times  per year    Marital Status: Divorced  Intimate Partner Violence: Not At Risk (05/10/2023)   Humiliation, Afraid, Rape, and Kick questionnaire    Fear of Current or Ex-Partner: No    Emotionally Abused: No    Physically Abused: No    Sexually Abused: No    Family History  Problem Relation Age of Onset   Stroke Mother    Hypertension Mother    Diabetes Mother    Hypertension Father    Heart disease Sister        MI   Multiple sclerosis Daughter    Multiple sclerosis Son    Cerebral aneurysm Son  Seizures Son    Cerebral aneurysm Son    Breast cancer Paternal Aunt 75    Current Outpatient Medications  Medication Sig Dispense Refill   amLODipine  (NORVASC ) 10 MG tablet TAKE 1 TABLET BY MOUTH AT BEDTIME 90 tablet 0   aspirin  81 MG tablet Take 81 mg by mouth in the morning.     betamethasone  dipropionate 0.05 % cream Apply topically 2 (two) times daily. To affected area 45 g 0   buprenorphine  (BUTRANS ) 10 MCG/HR PTWK Place 1 patch onto the skin once a week. 4 patch 2   clopidogrel  (PLAVIX ) 75 MG tablet Take 1 tablet (75 mg total) by mouth at bedtime. To prevent strokes 90 tablet 1   DULoxetine  (CYMBALTA ) 20 MG capsule Take 1 capsule (20 mg total) by mouth daily. For chronic pain, anxiety and depression 30 capsule 0   ezetimibe  (ZETIA ) 10 MG tablet Take 1 tablet (10 mg total) by mouth at bedtime. For cholesterol 30 tablet 0   ferrous sulfate  325 (65 FE) MG EC tablet Take 1 tablet (325 mg total) by mouth daily.     furosemide  (LASIX ) 20 MG tablet Take 1 tablet (20 mg total) by mouth daily. For fluid retention 30 tablet 0   hydrALAZINE  (APRESOLINE ) 50 MG tablet TAKE 1 TABLET BY MOUTH THREE TIMES DAILY AS NEEDED FOR BLOOD PRESSURE GREATER THAN 150 270 tablet 0   hydrOXYzine  (VISTARIL ) 25 MG capsule Take 1 capsule (25 mg total) by mouth every 6 (six) hours as needed. 60 capsule 0   labetalol  (NORMODYNE ) 300 MG tablet Take 1 tablet (300 mg total) by mouth 2 (two) times daily. For  hypertension 180 tablet 1   losartan  (COZAAR ) 50 MG tablet Take 1 tablet (50 mg total) by mouth at bedtime. For blood pressure 30 tablet 0   mirabegron  ER (MYRBETRIQ ) 50 MG TB24 tablet Take 1 tablet (50 mg total) by mouth daily. 30 tablet 11   nortriptyline  (PAMELOR ) 10 MG capsule Take 1 capsule (10 mg total) by mouth 2 (two) times daily. For chronic headaches 180 capsule 3   pantoprazole  (PROTONIX ) 40 MG tablet One tablet upon waking,  take 30 minuts prior to eating 90 tablet 3   ranolazine  (RANEXA ) 500 MG 12 hr tablet Take 1 tablet (500 mg total) by mouth 2 (two) times daily. For chronic chest pain 60 tablet 0   vitamin C  (ASCORBIC ACID ) 250 MG tablet Take 1 tablet (250 mg total) by mouth daily. 90 tablet 1   No current facility-administered medications for this visit.    Allergies  Allergen Reactions   Celexa  [Citalopram ] Anaphylaxis    Throat closing    Dilaudid  [Hydromorphone  Hcl] Nausea And Vomiting   Effient [Prasugrel] Itching   Hydrochlorothiazide  Other (See Comments)    Decreased GFR (Nov 2015)   Liothyronine      Hair fell out, caused headaches    Nsaids     CKD stage III - avoid nephrotoxic drugs   Nubain [Nalbuphine Hcl]     Burning sensation in back   Penicillins Itching   Statins Itching     REVIEW OF SYSTEMS:   [X]  denotes positive finding, [ ]  denotes negative finding Cardiac  Comments:  Chest pain or chest pressure:    Shortness of breath upon exertion:    Short of breath when lying flat:    Irregular heart rhythm:        Vascular    Pain in calf, thigh, or hip brought on by ambulation:  Pain in feet at night that wakes you up from your sleep:     Blood clot in your veins:    Leg swelling:         Pulmonary    Oxygen at home:    Productive cough:     Wheezing:         Neurologic    Sudden weakness in arms or legs:     Sudden numbness in arms or legs:     Sudden onset of difficulty speaking or slurred speech:    Temporary loss of vision in one  eye:     Problems with dizziness:         Gastrointestinal    Blood in stool:     Vomited blood:         Genitourinary    Burning when urinating:     Blood in urine:        Psychiatric    Major depression:         Hematologic    Bleeding problems:    Problems with blood clotting too easily:        Skin    Rashes or ulcers:        Constitutional    Fever or chills:      PHYSICAL EXAMINATION:  There were no vitals filed for this visit.   General:  WDWN in NAD; vital signs documented above Gait: Not observed HENT: WNL, normocephalic Pulmonary: normal non-labored breathing , without wheezing Cardiac: regular HR, Abdomen: soft, NT, no masses Skin: without rashes Vascular Exam/Pulses:  Right Left  Radial 2+ (normal) 2+ (normal)  Ulnar 2+ (normal) 2+ (normal)  Femoral    Popliteal    DP nonpalpable nonpalpable  PT nonpalpable nonpalpable   Extremities: without ischemic changes, without Gangrene , without cellulitis; without open wounds;  Right groin wound with 3 small areas opened, probe less than 1 cm, Musculoskeletal: no muscle wasting or atrophy  Neurologic: A&O X 3;  No focal weakness or paresthesias are detected Psychiatric:  The pt has Normal affect.   Non-Invasive Vascular Imaging:    Abdominal Aorta Findings:  +-------------+-------+----------+----------+----------+--------+----------  ----+  Location    AP (cm)Trans (cm)PSV (cm/s)Waveform  ThrombusComments         +-------------+-------+----------+----------+----------+--------+----------  ----+  Proximal                     79                                           +-------------+-------+----------+----------+----------+--------+----------  ----+  Mid                          114                                          +-------------+-------+----------+----------+----------+--------+----------  ----+  Distal                       132                                           +-------------+-------+----------+----------+----------+--------+----------  ----+  RT CIA Prox                   167       monophasic                         +-------------+-------+----------+----------+----------+--------+----------  ----+  RT CIA Mid                    201       monophasic        brisk            +-------------+-------+----------+----------+----------+--------+----------  ----+  RT EIA Mid                    255       monophasic        brisk            +-------------+-------+----------+----------+----------+--------+----------  ----+  RT EIA Distal                 391       monophasic        brisk ,  no                                                                plaque                                                                      visualized      +-------------+-------+----------+----------+----------+--------+----------  ----+  LT CIA Prox                   316       monophasic        brisk ,                                                                    tortuous,  NWV   +-------------+-------+----------+----------+----------+--------+----------  ----+  LT CIA Distal                 234       monophasic        brisk ,  curve   +-------------+-------+----------+----------+----------+--------+----------  ----+  LT EIA Prox                   263       monophasic        brisk .  NWV     +-------------+-------+----------+----------+----------+--------+----------  ----+  LT EIA Mid                    247       biphasic                           +-------------+-------+----------+----------+----------+--------+----------  ----+  LT EIA Distal                 215       monophasic        brisk,  curve    +-------------+-------+----------+----------+----------+--------+----------   ABI Findings:   +---------+------------------+-----+----------+--------+  Right   Rt Pressure (mmHg)IndexWaveform  Comment   +---------+------------------+-----+----------+--------+  Brachial 170                                        +---------+------------------+-----+----------+--------+  PTA     96                0.51 monophasic          +---------+------------------+-----+----------+--------+  DP      100               0.53 monophasic          +---------+------------------+-----+----------+--------+  Great Toe79                0.42 Abnormal            +---------+------------------+-----+----------+--------+   +---------+------------------+-----+----------+-------+  Left    Lt Pressure (mmHg)IndexWaveform  Comment  +---------+------------------+-----+----------+-------+  Brachial 189                                       +---------+------------------+-----+----------+-------+  PTA     122               0.65 monophasic         +---------+------------------+-----+----------+-------+  DP      114               0.60 biphasic           +---------+------------------+-----+----------+-------+  Great Toe82                0.43 Abnormal           +---------+------------------+-----+----------+-------+   +-------+-----------+-----------+------------+------------+  ABI/TBIToday's ABIToday's TBIPrevious ABIPrevious TBI  +-------+-----------+-----------+------------+------------+  Right 0.53       0.42       0.53        0.38          +-------+-----------+-----------+------------+------------+  Left  0.65       0.43       0.60        0.40          +-------+-----------+-----------+------------+------------+    Right Carotid Findings:  +----------+--------+--------+--------+------------------+--------+           PSV cm/sEDV cm/sStenosisPlaque DescriptionComments   +----------+--------+--------+--------+------------------+--------+  CCA Prox  69      1               heterogenous                +----------+--------+--------+--------+------------------+--------+  CCA Mid   62      2               heterogenous                +----------+--------+--------+--------+------------------+--------+  CCA Distal21      1               heterogenous                +----------+--------+--------+--------+------------------+--------+  ICA Prox  Occluded                            +----------+--------+--------+--------+------------------+--------+  ICA Mid                   Occluded                            +----------+--------+--------+--------+------------------+--------+  ICA Distal                Occluded                            +----------+--------+--------+--------+------------------+--------+  ECA      104     17                                          +----------+--------+--------+--------+------------------+--------+   +----------+--------+-------+--------+-------------------+           PSV cm/sEDV cmsDescribeArm Pressure (mmHG)  +----------+--------+-------+--------+-------------------+  [id number]    12     Stenotic170                  +----------+--------+-------+--------+-------------------+   +---------+--------+--+--------+--+---------+  VertebralPSV cm/s66EDV cm/s14Antegrade  +---------+--------+--+--------+--+---------+      Left Carotid Findings:  +----------+--------+--------+--------+------------------+--------+           PSV cm/sEDV cm/sStenosisPlaque DescriptionComments  +----------+--------+--------+--------+------------------+--------+  CCA Prox  157     17                                          +----------+--------+--------+--------+------------------+--------+  CCA Mid   129     21                                           +----------+--------+--------+--------+------------------+--------+  CCA Distal86      19              heterogenous                +----------+--------+--------+--------+------------------+--------+  ICA Prox  98      32      1-39%   heterogenous                +----------+--------+--------+--------+------------------+--------+  ICA Mid   86      26                                          +----------+--------+--------+--------+------------------+--------+  ICA Distal91      28                                          +----------+--------+--------+--------+------------------+--------+  ECA      109     15              calcific                    +----------+--------+--------+--------+------------------+--------+   +----------+--------+--------+--------+-------------------+  PSV cm/sEDV cm/sDescribeArm Pressure (mmHG)  +----------+--------+--------+--------+-------------------+  [id number]    7       Stenotic189                  +----------+--------+--------+--------+-------------------+   +---------+--------+--+--------+-+---------+  VertebralPSV cm/s33EDV cm/s9Antegrade  +---------+--------+--+--------+-+---------+    ASSESSMENT/PLAN: Sabrina [name] is a 83 y.o. female presenting in follow up s/p right groin exploration, redo CFA patch plasty with ipsilateral GSV.  In December 2023 she underwent right-sided EIA drug-coated balloon angioplasty, bilateral renal artery angioplasty for assisted primary patency of bilateral renal artery stents.  Last imaging occurred 04/2023 due to concern for threatened stents.  Imaging demonstrated widely patent iliac stents bilaterally.  Imaging studies were reviewed.  Iliac velocities are elevated, but stable -similar to the velocities at the time of her last angiogram.  ABIs stable, carotid disease stable.  At this point, my plan is to follow Sabrina [name] on a 26-month basis.  At her next  visit, we will also need to insonate her bilateral renal artery stents.   Kayla Part, MD [facility] [phone]  Total time of patient care including pre-visit research, consultation, and documentation greater than 40 minutes

## 2024-03-22 ENCOUNTER — Ambulatory Visit (HOSPITAL_COMMUNITY): Payer: Medicare Other

## 2024-03-22 ENCOUNTER — Ambulatory Visit (HOSPITAL_COMMUNITY): Admission: RE | Admit: 2024-03-22 | Payer: Medicare Other | Source: Ambulatory Visit

## 2024-03-22 ENCOUNTER — Ambulatory Visit: Payer: Medicare Other | Admitting: Vascular Surgery

## 2024-03-25 ENCOUNTER — Other Ambulatory Visit: Payer: Self-pay | Admitting: Internal Medicine

## 2024-03-25 DIAGNOSIS — I10 Essential (primary) hypertension: Secondary | ICD-10-CM

## 2024-04-09 DIAGNOSIS — L309 Dermatitis, unspecified: Secondary | ICD-10-CM | POA: Diagnosis not present

## 2024-04-12 ENCOUNTER — Other Ambulatory Visit: Payer: Self-pay | Admitting: Internal Medicine

## 2024-04-12 DIAGNOSIS — N184 Chronic kidney disease, stage 4 (severe): Secondary | ICD-10-CM | POA: Diagnosis not present

## 2024-04-12 DIAGNOSIS — N2581 Secondary hyperparathyroidism of renal origin: Secondary | ICD-10-CM | POA: Diagnosis not present

## 2024-04-12 DIAGNOSIS — I10 Essential (primary) hypertension: Secondary | ICD-10-CM | POA: Diagnosis not present

## 2024-04-12 DIAGNOSIS — D631 Anemia in chronic kidney disease: Secondary | ICD-10-CM | POA: Diagnosis not present

## 2024-04-12 NOTE — Telephone Encounter (Unsigned)
 Copied from CRM [id number]. Topic: Clinical - Medication Refill >> Apr 12, 2024 11:42 AM Mesmerise C wrote: Medication:  furosemide  (LASIX ) 20 MG tablet ranolazine  (RANEXA ) 500 MG 12 hr tablet mirabegron  ER (MYRBETRIQ ) 50 MG TB24 tablet losartan  (COZAAR ) 50 MG tablet HYDROcodone -acetaminophen  (NORCO) 10-325 MG tablet   Has the patient contacted their pharmacy? Yes (Agent: If no, request that the patient contact the pharmacy for the refill. If patient does not wish to contact the pharmacy document the reason why and proceed with request.) (Agent: If yes, when and what did the pharmacy advise?) Pharmacy will not answer This is the patient's preferred pharmacy:  [facility] [address] ([address]), [place] - [address] [place]) [place] [address] Phone: [phone] Fax: [phone]  Is this the correct pharmacy for this prescription? Yes If no, delete pharmacy and type the correct one.   Has the prescription been filled recently? No  Is the patient out of the medication? Yes  Has the patient been seen for an appointment in the last year OR does the patient have an upcoming appointment? Yes  Can we respond through MyChart? No  Agent: Please be advised that Rx refills may take up to 3 business days. We ask that you follow-up with your pharmacy.

## 2024-04-16 DIAGNOSIS — I10 Essential (primary) hypertension: Secondary | ICD-10-CM | POA: Diagnosis not present

## 2024-04-16 DIAGNOSIS — N184 Chronic kidney disease, stage 4 (severe): Secondary | ICD-10-CM | POA: Diagnosis not present

## 2024-04-16 DIAGNOSIS — N2581 Secondary hyperparathyroidism of renal origin: Secondary | ICD-10-CM | POA: Diagnosis not present

## 2024-04-16 DIAGNOSIS — D631 Anemia in chronic kidney disease: Secondary | ICD-10-CM | POA: Diagnosis not present

## 2024-04-16 MED ORDER — RANOLAZINE ER 500 MG PO TB12: 500.0000 mg | ORAL_TABLET | Freq: Two times a day (BID) | ORAL | 0 refills | Status: DC

## 2024-04-16 MED ORDER — FUROSEMIDE 20 MG PO TABS: 20.0000 mg | ORAL_TABLET | Freq: Every day | ORAL | 0 refills | Status: DC

## 2024-04-16 MED ORDER — LOSARTAN POTASSIUM 50 MG PO TABS: 50.0000 mg | ORAL_TABLET | Freq: Every day | ORAL | 0 refills | Status: DC

## 2024-04-16 NOTE — Telephone Encounter (Signed)
 Myrbetriq  is being filled by Dr. Gaston  Hydrocodone  is not on pt's current medication list.  Refilled: 08/06/2023 Last OV: 01/03/2024 Next OV: not scheduled

## 2024-04-20 ENCOUNTER — Ambulatory Visit: Payer: Self-pay

## 2024-04-20 NOTE — Telephone Encounter (Signed)
 Copied from CRM [id number]. Topic: Clinical - Red Word Triage >> Apr 20, 2024 10:52 AM Ernestene P wrote: Red Word that prompted transfer to Nurse Triage: weakness, severe back pain   ----------------------------------------------------------------------- From previous Reason for Contact - Scheduling: Patient/patient representative is calling to schedule an appointment. Refer to attachments for appointment information. Reason for Disposition  Back pain is a chronic symptom (recurrent or ongoing AND present > 4 weeks)  Answer Assessment - Initial Assessment Questions 1. ONSET: When did the pain begin?      Has been ongoing for several years, worsening the past 5 days  2. LOCATION: Where does it hurt? (upper, mid or lower back)     Lower back  3. SEVERITY: How bad is the pain?  (e.g., Scale 1-10; mild, moderate, or severe)   - MILD (1-3): Doesn't interfere with normal activities.    - MODERATE (4-7): Interferes with normal activities or awakens from sleep.    - SEVERE (8-10): Excruciating pain, unable to do any normal activities.      Severe pain  4. PATTERN: Is the pain constant? (e.g., yes, no; constant, intermittent)      Intermittent, whenever walking  5. RADIATION: Does the pain shoot into your legs or somewhere else?     Does not radiate  6. CAUSE:  What do you think is causing the back pain?      Unsure of cause, but says she fell off the bed 7. BACK OVERUSE:  Any recent lifting of heavy objects, strenuous work or exercise?     No  8. MEDICINES: What have you taken so far for the pain? (e.g., nothing, acetaminophen , NSAIDS)     Has a pain patient  9. NEUROLOGIC SYMPTOMS: Do you have any weakness, numbness, or problems with bowel/bladder control?     No  10. OTHER SYMPTOMS: Do you have any other symptoms? (e.g., fever, abdomen pain, burning with urination, blood in urine)       No  11. PREGNANCY: Is there any chance you are pregnant? When was your  last menstrual period?       No  Protocols used: Back Pain-A-AH

## 2024-04-26 ENCOUNTER — Ambulatory Visit: Admitting: Student in an Organized Health Care Education/Training Program

## 2024-05-01 ENCOUNTER — Encounter: Payer: Self-pay | Admitting: Nurse Practitioner

## 2024-05-01 ENCOUNTER — Ambulatory Visit: Attending: Nurse Practitioner | Admitting: Nurse Practitioner

## 2024-05-01 VITALS — BP 165/63 | HR 68 | Temp 97.7°F | Ht 60.0 in | Wt 162.0 lb

## 2024-05-01 DIAGNOSIS — M5416 Radiculopathy, lumbar region: Secondary | ICD-10-CM | POA: Diagnosis present

## 2024-05-01 DIAGNOSIS — G894 Chronic pain syndrome: Secondary | ICD-10-CM | POA: Insufficient documentation

## 2024-05-01 DIAGNOSIS — M47816 Spondylosis without myelopathy or radiculopathy, lumbar region: Secondary | ICD-10-CM | POA: Diagnosis present

## 2024-05-01 DIAGNOSIS — Z79899 Other long term (current) drug therapy: Secondary | ICD-10-CM | POA: Insufficient documentation

## 2024-05-01 DIAGNOSIS — M4726 Other spondylosis with radiculopathy, lumbar region: Secondary | ICD-10-CM | POA: Diagnosis not present

## 2024-05-01 DIAGNOSIS — G8929 Other chronic pain: Secondary | ICD-10-CM | POA: Insufficient documentation

## 2024-05-01 DIAGNOSIS — Z0289 Encounter for other administrative examinations: Secondary | ICD-10-CM | POA: Diagnosis not present

## 2024-05-01 DIAGNOSIS — Z7189 Other specified counseling: Secondary | ICD-10-CM | POA: Insufficient documentation

## 2024-05-01 MED ORDER — HYDROCODONE-ACETAMINOPHEN 5-325 MG PO TABS: 1.0000 | ORAL_TABLET | Freq: Three times a day (TID) | ORAL | 0 refills | Status: DC | PRN

## 2024-05-01 NOTE — Patient Instructions (Signed)
Medication Rules  Applies to: All patients receiving prescriptions (written or electronic).  Pharmacy of record: Pharmacy where electronic prescriptions will be sent. If written prescriptions are taken to a different pharmacy, please inform the nursing staff. The pharmacy listed in the electronic medical record should be the one where you would like electronic prescriptions to be sent.  Prescription refills: Only during scheduled appointments. Applies to both, written and electronic prescriptions.  NOTE: The following applies primarily to controlled substances (Opioid* Pain Medications).   Patient's responsibilities: Pain Pills: Bring all pain pills to every appointment (except for procedure appointments). Pill Bottles: Bring pills in original pharmacy bottle. Always bring newest bottle. Bring bottle, even if empty. Medication refills: You are responsible for knowing and keeping track of what medications you need refilled. The day before your appointment, write a list of all prescriptions that need to be refilled. Bring that list to your appointment and give it to the admitting nurse. Prescriptions will be written only during appointments. If you forget a medication, it will not be "Called in", "Faxed", or "electronically sent". You will need to get another appointment to get these prescribed. Prescription Accuracy: You are responsible for carefully inspecting your prescriptions before leaving our office. Have the discharge nurse carefully go over each prescription with you, before taking them home. Make sure that your name is accurately spelled, that your address is correct. Check the name and dose of your medication to make sure it is accurate. Check the number of pills, and the written instructions to make sure they are clear and accurate. Make sure that you are given enough medication to last until your next medication refill appointment. Taking Medication: Take medication as prescribed. Never  take more pills than instructed. Never take medication more frequently than prescribed. Taking less pills or less frequently is permitted and encouraged, when it comes to controlled substances (written prescriptions).  Inform other Doctors: Always inform, all of your healthcare providers, of all the medications you take. Pain Medication from other Providers: You are not allowed to accept any additional pain medication from any other Doctor or Healthcare provider. There are two exceptions to this rule. (see below) In the event that you require additional pain medication, you are responsible for notifying us, as stated below. Medication Agreement: You are responsible for carefully reading and following our Medication Agreement. This must be signed before receiving any prescriptions from our practice. Safely store a copy of your signed Agreement. Violations to the Agreement will result in no further prescriptions. (Additional copies of our Medication Agreement are available upon request.) Laws, Rules, & Regulations: All patients are expected to follow all Federal and State Laws, Statutes, Rules, & Regulations. Ignorance of the Laws does not constitute a valid excuse. The use of any illegal substances is prohibited. Adopted CDC guidelines & recommendations: Target dosing levels will be at or below 60 MME/day. Use of benzodiazepines** is not recommended.  Exceptions: There are only two exceptions to the rule of not receiving pain medications from other Healthcare Providers. Exception #1 (Emergencies): In the event of an emergency (i.e.: accident requiring emergency care), you are allowed to receive additional pain medication. However, you are responsible for: As soon as you are able, call our office [phone], at any time of the day or night, and leave a message stating your name, the date and nature of the emergency, and the name and dose of the medication prescribed. In the event that your call is answered  by a member of   our staff, make sure to document and save the date, time, and the name of the person that took your information.  Exception #2 (Planned Surgery): In the event that you are scheduled by another doctor or dentist to have any type of surgery or procedure, you are allowed (for a period no longer than 30 days), to receive additional pain medication, for the acute post-op pain. However, in this case, you are responsible for picking up a copy of our "Post-op Pain Management for Surgeons" handout, and giving it to your surgeon or dentist. This document is available at our office, and does not require an appointment to obtain it. Simply go to our office during business hours (Monday-Thursday from 8:00 AM to 4:00 PM) (Friday 8:00 AM to 12:00 Noon) or if you have a scheduled appointment with us, prior to your surgery, and ask for it by name. In addition, you will need to provide us with your name, name of your surgeon, type of surgery, and date of procedure or surgery.  *Opioid medications include: morphine, codeine, oxycodone, oxymorphone, hydrocodone, hydromorphone, meperidine, tramadol, tapentadol, buprenorphine, fentanyl, methadone. **Benzodiazepine medications include: diazepam (Valium), alprazolam (Xanax), clonazepam (Klonopine), lorazepam (Ativan), clorazepate (Tranxene), chlordiazepoxide (Librium), estazolam (Prosom), oxazepam (Serax), temazepam (Restoril), triazolam (Halcion) (Last updated: 12/22/2017)  

## 2024-05-01 NOTE — Progress Notes (Signed)
 Safety precautions to be maintained throughout the outpatient stay will include: orient to surroundings, keep bed in low position, maintain call bell within reach at all times, provide assistance with transfer out of bed and ambulation.

## 2024-05-01 NOTE — Progress Notes (Signed)
 PROVIDER NOTE: Interpretation of information contained herein should be left to medically-trained personnel. Specific patient instructions are provided elsewhere under Patient Instructions section of medical record. This document was created in part using AI and STT-dictation technology, any transcriptional errors that may result from this process are unintentional.  Patient: Sabrina [name]  Service: E/M   PCP: [name], MD  DOB: April 24, 1941  DOS: 05/01/2024  Provider: Emmy MARLA Blanch, NP  MRN: [id number]  Delivery: Face-to-face  Specialty: Interventional Pain Management  Type: Established Patient  Setting: Ambulatory outpatient facility  Specialty designation: 09  Referring Prov.: [name], MD  Location: Outpatient office facility       History of present illness (HPI) Sabrina [name], a 83 y.o. year old female, is here today because of her Chronic pain syndrome [G89.4]. Sabrina [name] primary complain today is Back Pain (lower)  Pertinent problems: Sabrina [name] has Hip pain, bilateral; Peripheral vascular disease (HCC); Generalized anxiety disorder; Chronic right hip pain; Cervical spondylosis without myelopathy; Chronic radicular lumbar pain; and Chronic pain syndrome on their pertinent problem list  Pain Assessment: Severity of Chronic pain is reported as a 10-Worst pain ever/10. Location: Back Left, Right/down down hip. Onset: More than a month ago. Quality: Aching, Burning, Constant, Throbbing, Stabbing, Shooting, Sharp, Discomfort. Timing: Constant. Modifying factor(s): nothing. Vitals:  height is 5' (1.524 m) and weight is 162 lb (73.5 kg). Her temperature is 97.7 F (36.5 C). Her blood pressure is 165/63 (abnormal) and her pulse is 68. Her oxygen saturation is 100%.  BMI: Estimated body mass index is 31.64 kg/m as calculated from the following:   Height as of this encounter: 5' (1.524 m).   Weight as of this encounter: 162 lb (73.5 kg).  Last encounter:  02/14/2024 Last procedure: Visit date not found.  Reason for encounter: medication management. No change in medical history since last visit.  Patient's pain is at baseline.  Patient continues multimodal pain regimen as prescribed.  States that it provides pain relief and improvement in functional status.  The patient continues struggle with bilateral chronic hip pain, which affect her daily activities. She has a Hx. Of bilateral hip replacement.  The patient reports that the Butrans  patch caused significant headache, so she has discontinued its use.  We discussed alternative pain management option with the with the Hydrocodone -acetaminophen  5-325 mg  every 8 hours as needed for pain Pharmacotherapy Assessment   Hydrocodone -acetaminophen  5-325 mg  every 8 hours as needed for pain.  Monitoring: [place] PMP: PDMP reviewed during this encounter.       Pharmacotherapy: No side-effects or adverse reactions reported. Compliance: No problems identified. Effectiveness: Clinically acceptable.  Delores Dorothe LABOR, RN  05/01/2024  2:07 PM  Sign when Signing Visit Safety precautions to be maintained throughout the outpatient stay will include: orient to surroundings, keep bed in low position, maintain call bell within reach at all times, provide assistance with transfer out of bed and ambulation.     UDS:  No results found for: SUMMARY  No results found for: CBDTHCR No results found for: D8THCCBX No results found for: D9THCCBX  ROS  Constitutional: Denies any fever or chills Gastrointestinal: No reported hemesis, hematochezia, vomiting, or acute GI distress Musculoskeletal: Lower back pain Neurological: No reported episodes of acute onset apraxia, aphasia, dysarthria, agnosia, amnesia, paralysis, loss of coordination, or loss of consciousness  Medication Review  DULoxetine , HYDROcodone -acetaminophen , amLODipine , aspirin , betamethasone  dipropionate, clopidogrel , ezetimibe , ferrous sulfate ,  furosemide , hydrALAZINE , hydrOXYzine , labetalol , losartan , mirabegron  ER,  nortriptyline , pantoprazole , ranolazine , and vitamin C   History Review  Allergy: Sabrina [name] is allergic to celexa  [citalopram ], dilaudid  [hydromorphone  hcl], effient [prasugrel], hydrochlorothiazide , liothyronine , nsaids, nubain [nalbuphine hcl], penicillins, and statins. Drug: Sabrina [name]  reports no history of drug use. Alcohol:  reports no history of alcohol use. Tobacco:  reports that she quit smoking about 25 years ago. Her smoking use included cigarettes. She has been exposed to tobacco smoke. She has never used smokeless tobacco. Social: Sabrina [name]  reports that she quit smoking about 25 years ago. Her smoking use included cigarettes. She has been exposed to tobacco smoke. She has never used smokeless tobacco. She reports that she does not drink alcohol and does not use drugs. Medical:  has a past medical history of Abdominal aortic ectasia (HCC) (07/13/2017), Amputation of fifth toe, right, traumatic, subsequent encounter (HCC) (06/18/2019), Anemia of chronic kidney failure, Anxiety, Aortic stenosis (03/18/2020), CAD (coronary artery disease), Cardiac arrest [facility]), Carotid artery stenosis, Cataracts, bilateral, Cervical spondylosis without myelopathy, Chronic diastolic CHF (congestive heart failure), NYHA class 3 (HCC), Chronic kidney disease, stage III (moderate) (HCC), Chronic narcotic use (06/24/2014), Chronic pain syndrome, GERD (gastroesophageal reflux disease), History of 2019 novel coronavirus disease (COVID-19) (09/22/2021), Hyperlipidemia, Hypertension, Long term current use of antithrombotics/antiplatelets, Lumbar stenosis with neurogenic claudication, Mitral stenosis (11/16/2021), Osteoarthritis of hip, Postoperative wound infection (03/29/2022), Pulmonary hypertension (HCC) (12/12/2017), PVD (peripheral vascular disease) (HCC), Renal artery stenosis (HCC), 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 Surgical: Sabrina [name]  has a past surgical history that includes Laparoscopic cholecystectomy (Left, 10/26/1999); Tonsillectomy and adenoidectomy; Abdominal hysterectomy (1976); Total hip arthroplasty (Left, 2005); Carotid angioplasty (Left); Coronary angioplasty with stent (2000); Toe amputation (Right); Cystoscopy with stent placement (Bilateral); Total hip arthroplasty (Right, 2015); Coronary artery bypass graft (N/A, 03/29/2000); Carotid endarterectomy (Left, 09/09/2003); Renal artery angioplasty (Bilateral, 12/2013); Colonoscopy with propofol  (N/A, 08/16/2017); Esophagogastroduodenoscopy (egd) with propofol  (N/A, 08/16/2017); Esophagogastroduodenoscopy (egd) with propofol  (N/A, 06/29/2018); LEFT HEART CATH AND CORONARY ANGIOGRAPHY (N/A, 11/18/2021); Upper Extremity Angiography (Left, 11/23/2021); Lower Extremity Angiography (Right, 01/06/2022); LEFT HEART CATH AND CORS/GRAFTS ANGIOGRAPHY (Left, 11/19/2002); LEFT HEART CATH AND CORS/GRAFTS ANGIOGRAPHY (Left, 09/17/2003); Endarterectomy femoral (Bilateral, 03/17/2022); Insertion of iliac stent (Left, 03/17/2022); Groin debridement (Right, 03/30/2022); Endarterectomy femoral (Right, 03/30/2022); ABDOMINAL AORTOGRAM W/LOWER EXTREMITY (N/A, 10/06/2022); PERIPHERAL VASCULAR BALLOON ANGIOPLASTY (10/06/2022); and ABDOMINAL AORTOGRAM W/LOWER EXTREMITY (N/A, 05/04/2023). Family: family history includes Breast cancer (age of onset: 49) in her paternal aunt; Cerebral aneurysm in her son and son; Diabetes in her mother; Heart disease in her sister; Hypertension in her father and mother; Multiple sclerosis in her daughter and son; Seizures in her son; Stroke in her mother.  Laboratory Chemistry Profile   Renal Lab Results  Component Value Date   BUN 24 (H) 11/10/2023   CREATININE 1.70 (H) 11/10/2023   LABCREA 86 03/06/2018   GFR 32.48 (L) 08/25/2023   GFRAA 50 (L) 03/10/2020   GFRNONAA 30 (L)  11/10/2023    Hepatic Lab Results  Component Value Date   AST 17 11/10/2023   ALT 11 11/10/2023   ALBUMIN  3.7 11/10/2023   ALKPHOS 73 11/10/2023   LIPASE 16.0 07/06/2017    Electrolytes Lab Results  Component Value Date   NA 135 11/10/2023   K 4.1 11/10/2023   CL 101 11/10/2023   CALCIUM  9.0 11/10/2023   MG 2.5 (H) 03/31/2022   PHOS 3.8 09/23/2022    Bone No results found for: VD25OH, [id number], CI6874NY7, CI7874NY7, 25OHVITD1, 25OHVITD2,  25OHVITD3, TESTOFREE, TESTOSTERONE  Inflammation (CRP: Acute Phase) (ESR: Chronic Phase) Lab Results  Component Value Date   CRP <1.0 02/17/2023   ESRSEDRATE 53 (H) 02/17/2023   LATICACIDVEN 1.0 03/30/2022         Note: Above Lab results reviewed.  Recent Imaging Review  MM 3D SCREENING MAMMOGRAM BILATERAL BREAST CLINICAL DATA:  Screening.  EXAM: DIGITAL SCREENING BILATERAL MAMMOGRAM WITH TOMOSYNTHESIS AND CAD  TECHNIQUE: Bilateral screening digital craniocaudal and mediolateral oblique mammograms were obtained. Bilateral screening digital breast tomosynthesis was performed. The images were evaluated with computer-aided detection.  COMPARISON:  Previous exam(s).  ACR Breast Density Category b: There are scattered areas of fibroglandular density.  FINDINGS: There are no findings suspicious for malignancy.  IMPRESSION: No mammographic evidence of malignancy. A result letter of this screening mammogram will be mailed directly to the patient.  RECOMMENDATION: Screening mammogram in one year. (Code:SM-B-01Y)  BI-RADS CATEGORY  1: Negative.  Electronically Signed   By: Dina  Arceo M.D.   On: 01/20/2024 09:17 Note: Reviewed        Physical Exam  Vitals: BP (!) 165/63   Pulse 68   Temp 97.7 F (36.5 C)   Ht 5' (1.524 m)   Wt 162 lb (73.5 kg)   SpO2 100%   BMI 31.64 kg/m  BMI: Estimated body mass index is 31.64 kg/m as calculated from the following:   Height as of this encounter: 5' (1.524  m).   Weight as of this encounter: 162 lb (73.5 kg). Ideal: Ideal body weight: 45.5 kg (100 lb 4.9 oz) Adjusted ideal body weight: 56.7 kg (124 lb 15.8 oz) General appearance: Well nourished, well developed, and well hydrated. In no apparent acute distress Mental status: Alert, oriented x 3 (person, place, & time)       Respiratory: No evidence of acute respiratory distress Eyes: PERLA  Assessment   Diagnosis Status  1. Chronic pain syndrome   2. Lumbar facet arthropathy   3. Chronic radicular lumbar pain   4. Medication management   5. Pain management contract discussed   6. Pain management contract signed    Controlled Controlled Controlled   Updated Problems: No problems updated.  Plan of Care  Problem-specific:  Assessment and Plan Discontinue Butrans  patch Started hydrocodone -acetaminophen  (Norco/Vicodin) 5-325 mg tablet every 8 hours as needed for pain.  Prescribing drug monitoring (PDMP) reviewed. Routine UDS ordered today.  Schedule follow-up in 30 days for medication management.   Sabrina [name] has a current medication list which includes the following long-term medication(s): amlodipine , duloxetine , ezetimibe , ferrous sulfate , furosemide , hydralazine , labetalol , losartan , mirabegron  er, nortriptyline , pantoprazole , and ranolazine .  Pharmacotherapy (Medications Ordered): Meds ordered this encounter  Medications   HYDROcodone -acetaminophen  (NORCO/VICODIN) 5-325 MG tablet    Sig: Take 1 tablet by mouth every 8 (eight) hours as needed for severe pain (pain score 7-10). Must last 30 days.    Dispense:  90 tablet    Refill:  0    Must last 30 days.   Orders:  Orders Placed This Encounter  Procedures   ToxASSURE Select 13 (MW), Urine    Volume: 30 ml(s). Minimum 3 ml of urine is needed. Document temperature of fresh sample. Indications: Long term (current) use of opiate analgesic (S20.108)    Release to patient:   Immediate        Return in about 1  month (around 06/01/2024) for (F2F), (MM), Emmy Blanch NP.    Recent Visits Date Type Provider Dept  02/14/24 Office Visit  Marcelino Nurse, MD [facility]  Showing recent visits within past 90 days and meeting all other requirements Today's Visits Date Type Provider Dept  05/01/24 Office Visit Obinna Ehresman K, NP [facility]  Showing today's visits and meeting all other requirements Future Appointments Date Type Provider Dept  05/29/24 Appointment Breyon Blass K, NP [facility]  Showing future appointments within next 90 days and meeting all other requirements  I discussed the assessment and treatment plan with the patient. The patient was provided an opportunity to ask questions and all were answered. The patient agreed with the plan and demonstrated an understanding of the instructions.  Patient advised to call back or seek an in-person evaluation if the symptoms or condition worsens.  Duration of encounter: 30 minutes.  Total time on encounter, as per AMA guidelines included both the face-to-face and non-face-to-face time personally spent by the physician and/or other qualified health care professional(s) on the day of the encounter (includes time in activities that require the physician or other qualified health care professional and does not include time in activities normally performed by clinical staff). Physician's time may include the following activities when performed: Preparing to see the patient (e.g., pre-charting review of records, searching for previously ordered imaging, lab work, and nerve conduction tests) Review of prior analgesic pharmacotherapies. Reviewing PMP Interpreting ordered tests (e.g., lab work, imaging, nerve conduction tests) Performing post-procedure evaluations, including interpretation of diagnostic procedures Obtaining and/or reviewing separately obtained history Performing a medically appropriate examination and/or  evaluation Counseling and educating the patient/family/caregiver Ordering medications, tests, or procedures Referring and communicating with other health care professionals (when not separately reported) Documenting clinical information in the electronic or other health record Independently interpreting results (not separately reported) and communicating results to the patient/ family/caregiver Care coordination (not separately reported)  Note by: Mynor Witkop K Bridger Pizzi, NP (TTS and AI technology used. I apologize for any typographical errors that were not detected and corrected.) Date: 05/01/2024; Time: 3:39 PM

## 2024-05-02 ENCOUNTER — Encounter: Payer: Self-pay | Admitting: Internal Medicine

## 2024-05-02 ENCOUNTER — Ambulatory Visit (INDEPENDENT_AMBULATORY_CARE_PROVIDER_SITE_OTHER): Admitting: Internal Medicine

## 2024-05-02 VITALS — BP 138/78 | HR 65 | Temp 97.8°F | Ht 60.0 in | Wt 158.8 lb

## 2024-05-02 DIAGNOSIS — R1013 Epigastric pain: Secondary | ICD-10-CM | POA: Diagnosis not present

## 2024-05-02 DIAGNOSIS — G894 Chronic pain syndrome: Secondary | ICD-10-CM | POA: Diagnosis not present

## 2024-05-02 DIAGNOSIS — I10 Essential (primary) hypertension: Secondary | ICD-10-CM

## 2024-05-02 MED ORDER — METHOCARBAMOL 500 MG PO TABS: 500.0000 mg | ORAL_TABLET | Freq: Three times a day (TID) | ORAL | 0 refills | Status: AC | PRN

## 2024-05-02 NOTE — Assessment & Plan Note (Signed)
-   This problem is chronic and stable -Patient blood pressure today is 138/78 -Will continue with the current medication regimen including amlodipine , labetalol , torsemide and losartan  -No further workup at this time

## 2024-05-02 NOTE — Assessment & Plan Note (Signed)
-   Patient has a history of chronic back pain and follows with a pain management specialist for this -However, patient states that her back pain has been worsening over the last month -Pain is both spinal and right paraspinal with no radiation, moderate in intensity, exacerbated by movement associate with difficulty going up and down stairs as well as ambulating long distances -She has her pain management specialist yesterday and was prescribed hydrocodone  for her pain -Patient does have weight loss of approximately 12 pounds since March but no other red flag symptoms noted -Will obtain an MRI lumbar spine for further evaluation but I suspect that her pain is likely musculoskeletal in nature -Will start the patient on Robaxin  and refer her to physical therapy -I have also advised over-the-counter Bengay/Biofreeze/Tiger balm to see if this will help with her pain as well as I suspect a muscular component to the pain -Patient was cautioned against using Robaxin  in combination with her hydrocodone  and was told not to drive or operate heavy machinery after taking the Robaxin  -No further workup at this time

## 2024-05-02 NOTE — Assessment & Plan Note (Signed)
-   Patient complains of postprandial epigastric abdominal pain for the last month despite daily PPI use -Her last EGD was in 2019 which showed some mild erythematous mucous in her antrum -Patient states that she has been losing weight for the last few months.  She has lost approximately 12 pounds since March of this year -Will continue PPI for now -Referral to GI placed given epigastric pain and weight loss despite PPI use (concern for possible malignancy) -No further workup at this time

## 2024-05-02 NOTE — Progress Notes (Signed)
 Acute Office Visit  Subjective:     Patient ID: Sabrina [name], female    DOB: 1941/08/03, 83 y.o.   MRN: [id number]  Chief Complaint  Patient presents with   Back Pain    Right low back pain going into right groin Pain 10/10    Back Pain Associated symptoms include abdominal pain and weight loss.   Patient is in today for worsening low back pain over the last couple of months.  Patient states that she has been having back pain over the last couple months and has some difficulty ambulating.  Patient states that she has pain every time she moves her right leg and has difficulty going up and down stairs secondary to the pain as well as ambulating long distances.  She did see her pain management doctor yesterday and was prescribed hydrocodone  for her pain.  Pain is spinal as well as right paraspinal areas, moderate intensity, constant, worse with movement.  No shooting pain down her legs noted.  No bowel or bladder incontinence.  Of note, patient also complains of postprandial abdominal pain over the last month despite taking omeprazole  daily.  She also complains of decreased appetite and weight loss over the last few months.  No fevers or chills.  No blood in stool noted, no melena.  Review of Systems  Constitutional:  Positive for weight loss.  HENT: Negative.    Respiratory: Negative.    Cardiovascular: Negative.   Gastrointestinal:  Positive for abdominal pain. Negative for blood in stool, diarrhea, melena, nausea and vomiting.       Postprandial epigastric abdominal pain for the last month despite PPI use  Musculoskeletal:  Positive for back pain.  Neurological: Negative.   Psychiatric/Behavioral: Negative.          Objective:    BP 138/78   Pulse 65   Temp 97.8 F (36.6 C)   Ht 5' (1.524 m)   Wt 158 lb 12.8 oz (72 kg)   SpO2 99%   BMI 31.01 kg/m    Physical Exam Constitutional:      Appearance: Normal appearance.  HENT:     Head: Normocephalic and  atraumatic.  Cardiovascular:     Rate and Rhythm: Normal rate and regular rhythm.     Heart sounds: Murmur heard.     Comments: Systolic murmur best auscultated in the right second intercostal area noted Pulmonary:     Breath sounds: Normal breath sounds. No wheezing or rales.  Abdominal:     General: Bowel sounds are normal. There is no distension.     Tenderness: There is no abdominal tenderness.  Musculoskeletal:     Comments: No spinal tenderness noted to palpation.  Patient does have right paraspinal tenderness in her lower back.  No rash noted  Neurological:     Mental Status: She is alert. Mental status is at baseline.  Psychiatric:        Mood and Affect: Mood normal.        Behavior: Behavior normal.     No results found for any visits on 05/02/24.      Assessment & Plan:   Problem List Items Addressed This Visit       Cardiovascular and Mediastinum   Essential hypertension - Primary   - This problem is chronic and stable -Patient blood pressure today is 138/78 -Will continue with the current medication regimen including amlodipine , labetalol , torsemide and losartan  -No further workup at this time  Other   Abdominal pain   - Patient complains of postprandial epigastric abdominal pain for the last month despite daily PPI use -Her last EGD was in 2019 which showed some mild erythematous mucous in her antrum -Patient states that she has been losing weight for the last few months.  She has lost approximately 12 pounds since March of this year -Will continue PPI for now -Referral to GI placed given epigastric pain and weight loss despite PPI use (concern for possible malignancy) -No further workup at this time      Relevant Orders   Ambulatory referral to Gastroenterology   Chronic pain syndrome   - Patient has a history of chronic back pain and follows with a pain management specialist for this -However, patient states that her back pain has been  worsening over the last month -Pain is both spinal and right paraspinal with no radiation, moderate in intensity, exacerbated by movement associate with difficulty going up and down stairs as well as ambulating long distances -She has her pain management specialist yesterday and was prescribed hydrocodone  for her pain -Patient does have weight loss of approximately 12 pounds since March but no other red flag symptoms noted -Will obtain an MRI lumbar spine for further evaluation but I suspect that her pain is likely musculoskeletal in nature -Will start the patient on Robaxin  and refer her to physical therapy -I have also advised over-the-counter Bengay/Biofreeze/Tiger balm to see if this will help with her pain as well as I suspect a muscular component to the pain -Patient was cautioned against using Robaxin  in combination with her hydrocodone  and was told not to drive or operate heavy machinery after taking the Robaxin  -No further workup at this time      Relevant Medications   methocarbamol  (ROBAXIN ) 500 MG tablet   Other Relevant Orders   MR Lumbar Spine Wo Contrast   Ambulatory referral to Physical Therapy    Meds ordered this encounter  Medications   methocarbamol  (ROBAXIN ) 500 MG tablet    Sig: Take 1 tablet (500 mg total) by mouth every 8 (eight) hours as needed for muscle spasms.    Dispense:  30 tablet    Refill:  0    No follow-ups on file.  Adeli Frost, MD

## 2024-05-02 NOTE — Patient Instructions (Addendum)
-   It was a pleasure meeting you today -I am uncertain of the reason for your back pain but it may be secondary to a muscle strain -Will prescribe a muscle relaxer (Robaxin ) to see if this will help with your pain.  Please take it twice a day as needed.  Do not operate any heavy machinery or drive after taking this medication - Please try over-the-counter Bengay/Biofreeze/Tiger balm over the area to see if this will also help with the pain -I have put in a referral to physical therapy for you to see if this will help alleviate the pain -We will also check an MRI of your spine to further evaluate this pain -As far as your abdominal pain goes I am concerned that you are having persistent pain after eating despite taking omeprazole .  You have also lost approximately 12 pounds over the last 3 to 4 months.  I will put in a referral to gastroenterology for you to further evaluate this pain -Please call us  with any questions or concerns or if you need any refills

## 2024-05-05 LAB — TOXASSURE SELECT 13 (MW), URINE

## 2024-05-07 ENCOUNTER — Telehealth: Payer: Self-pay

## 2024-05-07 ENCOUNTER — Ambulatory Visit: Admitting: Urology

## 2024-05-07 ENCOUNTER — Ambulatory Visit (INDEPENDENT_AMBULATORY_CARE_PROVIDER_SITE_OTHER): Admitting: Urology

## 2024-05-07 VITALS — BP 147/72 | HR 81 | Ht 60.0 in | Wt 164.0 lb

## 2024-05-07 DIAGNOSIS — R32 Unspecified urinary incontinence: Secondary | ICD-10-CM | POA: Diagnosis not present

## 2024-05-07 DIAGNOSIS — N3941 Urge incontinence: Secondary | ICD-10-CM | POA: Diagnosis not present

## 2024-05-07 LAB — URINALYSIS, COMPLETE
Bilirubin, UA: NEGATIVE
Glucose, UA: NEGATIVE
Ketones, UA: NEGATIVE
Leukocytes,UA: NEGATIVE
Nitrite, UA: NEGATIVE
Specific Gravity, UA: 1.025 (ref 1.005–1.030)
Urobilinogen, Ur: 0.2 mg/dL (ref 0.2–1.0)
pH, UA: 6 (ref 5.0–7.5)

## 2024-05-07 LAB — MICROSCOPIC EXAMINATION: Epithelial Cells (non renal): >10 /HPF — AB (ref 0–10)

## 2024-05-07 MED ORDER — MIRABEGRON ER 50 MG PO TB24: 50.0000 mg | ORAL_TABLET | Freq: Every day | ORAL | 11 refills | Status: DC

## 2024-05-07 NOTE — Telephone Encounter (Signed)
 Copied from CRM [id number]. Topic: General - Other >> May 07, 2024  9:39 AM Zebedee SAUNDERS wrote: Reason for CRM: Pt believes she accidentally cancelled her appt on 05/17/2024 at 4:30 p.m for MRI at [facility] MR[facility]. Please contact them and let them know she wants to keep it.

## 2024-05-07 NOTE — Progress Notes (Signed)
 05/07/2024 1:18 PM   Nirali A Pearce 08-23-1941 [id number]  Referring provider: [name], MD [address] [place],  [place] [address]  No chief complaint on file.   HPI: I was consulted to assist the patient is urinary incontinence.  She has urge incontinence and no voiding.  Sometimes she might leak a small amount with coughing sneezing.  She wears 2 pads a day moderately wet   She voids every 3 hours and every 2 hours at night.  Her flow was good especially when she drinks a lot of fluids.   She thinks she may have had some sort of surgery or stent in her bladder years ago.  She has had a hysterectomy.  On further review it looks like she had a cystoscopy and bilateral stents in 2023 but I did not see it in the local medical record.  Since that time she had a normal renal ultrasound with no stents or hydronephrosis.  She has had a lot of vascular issues.     I would like to see the patient back on Gemtesa  samples and prescription for pelvic exam and cystoscopy in about 6 weeks. She has milder frequency and nocturia. She primarily has urge incontinence. I do not think she needs urodynamics currently    TOday Frequency stable.  Last culture negative Patient no longer leaking is very pleased.  Still has some urgency.  She did not want cystoscopy.Reassess 4 months durability on Gemtesa  30 x 11 sent to pharmacy   Today Patient started have incontinence again.  Clinically not infected.  Frequency stable.         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 b.) TTE 11/16/2021: EF >55%; mild AS (MPG 9 mmHg)   CAD (coronary artery disease)    a.) s/p 3v CABG 03/29/2000   Cardiac arrest [facility])    Carotid artery stenosis    a.) s/p LEFT CEA 09/09/2003. b.) Carotid doppler 88/78/7980: 1-39% LICA, CTO RICA; subclavian stenosis   Cataracts, bilateral    Cervical spondylosis without myelopathy    Chronic diastolic CHF (congestive heart failure), NYHA class 3 (HCC)    a.) TTE 05/27/2016: EF >55%, mild LA enlargement, triv PR, mild MR, mod TR; G3DD. b.) TTE 12/12/2017: EF >55%, mild LVH, BAE, mild MR/PR, mod TR; RVSP 52.8 mmHg. c.) TTE 03/18/2020: EF >55%, BAD, AS (MPG 8.7 mmHg); triv MR, mild TR/PR. d.) TTE 11/16/2021: EF >55%, LVH, G1DD, triv MR, mild PR, mod TR; AS (MPG 9 mmHg); MS (MPG 5 mmHg)   Chronic kidney disease, stage III (moderate) (HCC)    Chronic narcotic use 06/24/2014   Chronic pain syndrome    GERD (gastroesophageal reflux disease)    History of 2019 novel coronavirus disease (COVID-19) 09/22/2021   Hyperlipidemia    Hypertension    Long term current use of antithrombotics/antiplatelets    a.) on daily DAPT therapy (ASA + clopidogrel )   Lumbar stenosis with neurogenic claudication    Mitral stenosis 11/16/2021   a.) TTE 11/16/2021: EF >55%; mod MS (MPG 5 mmHg)  Osteoarthritis of hip    Postoperative wound infection 03/29/2022   Pulmonary hypertension (HCC) 12/12/2017   a.) TTE 12/12/2017: mild; RVSP 52.8 mmHg   PVD (peripheral vascular disease) (HCC)    Renal artery stenosis (HCC)    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    Surgical History: Past Surgical History:  Procedure Laterality Date   ABDOMINAL AORTOGRAM W/LOWER EXTREMITY N/A 10/06/2022   Procedure: ABDOMINAL AORTOGRAM W/LOWER EXTREMITY;  Surgeon:  Lanis Fonda BRAVO, MD;  Location: [facility] INVASIVE CV LAB;  Service: Cardiovascular;  Laterality: N/A;   ABDOMINAL AORTOGRAM W/LOWER EXTREMITY N/A 05/04/2023   Procedure: ABDOMINAL AORTOGRAM W/LOWER EXTREMITY;  Surgeon: Lanis Fonda BRAVO, MD;  Location: [facility] INVASIVE CV LAB;  Service: Cardiovascular;  Laterality: N/A;   ABDOMINAL HYSTERECTOMY  1976   CAROTID ARTERY ANGIOPLASTY Left    CAROTID ENDARTERECTOMY Left 09/09/2003   Procedure: CAROTID ENDARTERECTOMY; Location: [facility]; Surgeon: Charlie Lango, MD   COLONOSCOPY WITH PROPOFOL  N/A 08/16/2017   Procedure: COLONOSCOPY WITH PROPOFOL ;  Surgeon: Jinny Carmine, MD;  Location: [facility] ENDOSCOPY;  Service: Endoscopy;  Laterality: N/A;   CORONARY ANGIOPLASTY WITH STENT PLACEMENT  2000   CORONARY ARTERY BYPASS GRAFT N/A 03/29/2000   Procedure: 3v CORONARY ARTERY BYPASS GRAFT; Location: [facility]   CYSTOSCOPY WITH STENT PLACEMENT Bilateral    ENDARTERECTOMY FEMORAL Bilateral 03/17/2022   Procedure: ENDARTERECTOMY FEMORAL ( BILATERAL SFA STENT);  Surgeon: Jama Cordella MATSU, MD;  Location: [facility] ORS;  Service: Vascular;  Laterality: Bilateral;   ENDARTERECTOMY FEMORAL Right 03/30/2022   Procedure: RE-EXPOSURE OF RIGHT COMMON FEMORAL ARTERY, RE-DO RIGHT FEMORAL ENDARTERECTOMY WITH VEIN PATCH;  Surgeon: Lanis Fonda BRAVO, MD;  Location: [facility] OR;  Service: Vascular;  Laterality: Right;  WOUND VAC   ESOPHAGOGASTRODUODENOSCOPY (EGD) WITH PROPOFOL  N/A 08/16/2017   Procedure: ESOPHAGOGASTRODUODENOSCOPY (EGD) WITH PROPOFOL ;  Surgeon: Jinny Carmine, MD;  Location: [facility] ENDOSCOPY;  Service: Endoscopy;  Laterality: N/A;   ESOPHAGOGASTRODUODENOSCOPY (EGD) WITH PROPOFOL  N/A 06/29/2018   Procedure: ESOPHAGOGASTRODUODENOSCOPY (EGD) WITH PROPOFOL ;  Surgeon: Janalyn Keene NOVAK, MD;  Location: [facility] ENDOSCOPY;  Service: Endoscopy;  Laterality: N/A;   GROIN DEBRIDEMENT Right 03/30/2022   Procedure: RIGHT GROIN IRRIGATION & DEBRIDEMENT;  Surgeon: Lanis Fonda BRAVO, MD;  Location: [facility] OR;  Service:  Vascular;  Laterality: Right;   INSERTION OF ILIAC STENT Left 03/17/2022   Procedure: INSERTION OF ILIAC STENT;  Surgeon: Jama Cordella MATSU, MD;  Location: [facility] ORS;  Service: Vascular;  Laterality: Left;   LAPAROSCOPIC CHOLECYSTECTOMY Left 10/26/1999   Procedure: LAPAROSCOPIC CHOLECYSTECTOMY; Location: [facility]; Surgeon: Unknown Sharps, MD   LEFT HEART CATH AND CORONARY ANGIOGRAPHY N/A 11/18/2021   Procedure: LEFT HEART CATH AND CORONARY ANGIOGRAPHY;  Surgeon: Florencio Cara BIRCH, MD;  Location: [facility] INVASIVE CV LAB;  Service: Cardiovascular;  Laterality: N/A;   LEFT HEART CATH AND CORS/GRAFTS ANGIOGRAPHY Left 11/19/2002   Procedure: LEFT HEART CATH AND CORS/GRAFTS ANGIOGRAPHY; Location: [facility]; Surgeon: Margie Florencio, MD   LEFT HEART CATH AND CORS/GRAFTS ANGIOGRAPHY Left 09/17/2003   Procedure: LEFT HEART CATH AND CORS/GRAFTS ANGIOGRAPHY; Location: [facility]; Surgeon: Margie Florencio, MD   LOWER EXTREMITY ANGIOGRAPHY Right 01/06/2022   Procedure: Lower Extremity Angiography;  Surgeon: Jama Cordella MATSU, MD;  Location: [facility] INVASIVE CV LAB;  Service: Cardiovascular;  Laterality: Right;   PERIPHERAL VASCULAR BALLOON ANGIOPLASTY  10/06/2022   Procedure: PERIPHERAL VASCULAR BALLOON ANGIOPLASTY;  Surgeon: Lanis Fonda BRAVO, MD;  Location: [facility] INVASIVE CV LAB;  Service: Cardiovascular;;  left iliac and bilateral renal   RENAL ARTERY ANGIOPLASTY Bilateral 12/2013   TOE AMPUTATION Right    small toe   TONSILLECTOMY AND ADENOIDECTOMY     TOTAL HIP ARTHROPLASTY Left 2005   TOTAL HIP ARTHROPLASTY Right 2015   UPPER EXTREMITY ANGIOGRAPHY Left 11/23/2021   Procedure: UPPER EXTREMITY ANGIOGRAPHY;  Surgeon: Marea Selinda RAMAN, MD;  Location: [facility] INVASIVE CV LAB;  Service: Cardiovascular;  Laterality: Left;    Home Medications:  Allergies as of 05/07/2024       Reactions   Celexa  [citalopram ] Anaphylaxis   Throat closing    Dilaudid  [hydromorphone  Hcl] Nausea And Vomiting   Effient [prasugrel] Itching    Hydrochlorothiazide  Other (See Comments)   Decreased GFR (Nov 2015)   Liothyronine     Hair fell out, caused headaches    Nsaids    CKD stage III - avoid nephrotoxic drugs   Nubain [nalbuphine Hcl]    Burning sensation in back   Penicillins Itching   Statins Itching        Medication List        Accurate as of May 07, 2024  1:18 PM. If you have any questions, ask your nurse or doctor.          amLODipine  10 MG tablet Commonly known as: NORVASC  TAKE 1 TABLET BY MOUTH AT BEDTIME   aspirin  81 MG tablet Take 81 mg by mouth in the morning.   betamethasone  dipropionate 0.05 % cream Apply topically 2 (two) times daily. To affected area   clopidogrel  75 MG tablet Commonly known as: PLAVIX  Take 1 tablet (75 mg total) by mouth at bedtime. To prevent strokes   DULoxetine  20 MG capsule Commonly known as: Cymbalta  Take 1 capsule (20 mg total) by mouth daily. For chronic pain, anxiety and depression   ezetimibe  10 MG tablet Commonly known as: ZETIA  Take 1 tablet (10 mg total) by mouth at bedtime. For cholesterol   ferrous sulfate  325 (65 FE) MG EC tablet Take 1 tablet (325 mg total) by mouth daily.   furosemide  20 MG tablet Commonly known as: LASIX  Take 1 tablet (20 mg total) by mouth daily. For fluid retention   hydrALAZINE  50 MG tablet Commonly known as: APRESOLINE  TAKE 1 TABLET BY MOUTH THREE TIMES DAILY AS NEEDED FOR BLOOD PRESSURE GREATER THAN 150   HYDROcodone -acetaminophen  5-325 MG tablet Commonly known as: NORCO/VICODIN Take 1 tablet by mouth every 8 (eight) hours as needed for severe pain (pain score 7-10). Must last 30 days.   hydrOXYzine  25 MG capsule Commonly known as: VISTARIL  Take 1 capsule (25 mg total) by mouth every 6 (six) hours as needed.   labetalol  300 MG tablet Commonly known as: NORMODYNE  TAKE 1 TABLET BY MOUTH TWICE DAILY FOR HIGH BLOOD PRESSURE   losartan  50 MG tablet Commonly known as: COZAAR  Take 1 tablet (50 mg total) by mouth at  bedtime. For blood pressure   methocarbamol  500 MG tablet Commonly known as: ROBAXIN  Take 1 tablet (500 mg total) by mouth every 8 (eight) hours as needed for muscle spasms.   mirabegron  ER 50 MG Tb24 tablet Commonly known as: MYRBETRIQ  Take 1 tablet (50 mg total) by mouth daily.   nortriptyline  10 MG capsule Commonly known as: PAMELOR  Take 1 capsule (10 mg total) by mouth 2 (two) times daily. For chronic headaches   pantoprazole  40 MG  tablet Commonly known as: PROTONIX  One tablet upon waking,  take 30 minuts prior to eating   ranolazine  500 MG 12 hr tablet Commonly known as: RANEXA  Take 1 tablet (500 mg total) by mouth 2 (two) times daily. For chronic chest pain   vitamin C  250 MG tablet Commonly known as: ASCORBIC ACID  Take 1 tablet (250 mg total) by mouth daily.        Allergies:  Allergies  Allergen Reactions   Celexa  [Citalopram ] Anaphylaxis    Throat closing    Dilaudid  [Hydromorphone  Hcl] Nausea And Vomiting   Effient [Prasugrel] Itching   Hydrochlorothiazide  Other (See Comments)    Decreased GFR (Nov 2015)   Liothyronine      Hair fell out, caused headaches    Nsaids     CKD stage III - avoid nephrotoxic drugs   Nubain [Nalbuphine Hcl]     Burning sensation in back   Penicillins Itching   Statins Itching    Family History: Family History  Problem Relation Age of Onset   Stroke Mother    Hypertension Mother    Diabetes Mother    Hypertension Father    Heart disease Sister        MI   Multiple sclerosis Daughter    Multiple sclerosis Son    Cerebral aneurysm Son    Seizures Son    Cerebral aneurysm Son    Breast cancer Paternal Aunt 46    Social History:  reports that she quit smoking about 25 years ago. Her smoking use included cigarettes. She has been exposed to tobacco smoke. She has never used smokeless tobacco. She reports that she does not drink alcohol and does not use drugs.  ROS:                                         Physical Exam: There were no vitals taken for this visit.  Constitutional:  Alert and oriented, No acute distress. HEENT: [place] AT, moist mucus membranes.  Trachea midline, no masses.  Laboratory Data: Lab Results  Component Value Date   WBC 7.1 11/10/2023   HGB 10.3 (L) 11/10/2023   HCT 31.3 (L) 11/10/2023   MCV 89.2 11/10/2023   PLT 262 11/10/2023    Lab Results  Component Value Date   CREATININE 1.70 (H) 11/10/2023    No results found for: PSA  No results found for: TESTOSTERONE  Lab Results  Component Value Date   HGBA1C 5.9 07/19/2016    Urinalysis    Component Value Date/Time   COLORURINE STRAW (A) 06/13/2021 0034   APPEARANCEUR Hazy (A) 01/09/2024 1102   LABSPEC 1.004 (L) 06/13/2021 0034   LABSPEC 1.006 03/27/2014 0944   PHURINE 6.0 06/13/2021 0034   GLUCOSEU Negative 01/09/2024 1102   GLUCOSEU 50 mg/dL 93/96/7984 9055   HGBUR SMALL (A) 06/13/2021 0034   BILIRUBINUR Negative 01/09/2024 1102   BILIRUBINUR Negative 03/27/2014 0944   KETONESUR NEGATIVE 06/13/2021 0034   PROTEINUR 2+ (A) 01/09/2024 1102   PROTEINUR 30 (A) 06/13/2021 0034   NITRITE Negative 01/09/2024 1102   NITRITE NEGATIVE 06/13/2021 0034   LEUKOCYTESUR Negative 01/09/2024 1102   LEUKOCYTESUR NEGATIVE 06/13/2021 0034   LEUKOCYTESUR Negative 03/27/2014 0944    Pertinent Imaging: Urine reviewed and sent for culture  Assessment & Plan: Stop Gemtesa .  Reassess in 6 weeks on Myrbetriq  50 mg 30 x 11.  Call if urine culture is  positive  1. Urinary incontinence, unspecified type (Primary)    No follow-ups on file.  Glendia DELENA Elizabeth, MD  [facility] [address], [address] [place], [place] [address] [phone]

## 2024-05-07 NOTE — Telephone Encounter (Signed)
 Called and spoke to pt to let her know that her appointment on 05/17/2024 for the MRI is still scheduled, and was not canceled.

## 2024-05-09 ENCOUNTER — Telehealth: Payer: Self-pay

## 2024-05-09 NOTE — Telephone Encounter (Signed)
 Called Patient per Dr. Narendra to let her know that [facility] has been trying to contact her to schedule an initial visit. Patient asked for [facility] phone number so I gave it to her.

## 2024-05-10 ENCOUNTER — Encounter: Payer: Self-pay | Admitting: Internal Medicine

## 2024-05-10 ENCOUNTER — Encounter: Admitting: Student in an Organized Health Care Education/Training Program

## 2024-05-11 ENCOUNTER — Other Ambulatory Visit: Payer: Self-pay

## 2024-05-11 MED ORDER — FUROSEMIDE 20 MG PO TABS: 20.0000 mg | ORAL_TABLET | Freq: Every day | ORAL | 0 refills | Status: DC

## 2024-05-12 LAB — CULTURE, URINE COMPREHENSIVE

## 2024-05-13 ENCOUNTER — Other Ambulatory Visit: Payer: Self-pay | Admitting: Internal Medicine

## 2024-05-14 ENCOUNTER — Ambulatory Visit (INDEPENDENT_AMBULATORY_CARE_PROVIDER_SITE_OTHER): Payer: Medicare Other | Admitting: *Deleted

## 2024-05-14 VITALS — Ht 60.0 in | Wt 167.0 lb

## 2024-05-14 DIAGNOSIS — Z0000 Encounter for general adult medical examination without abnormal findings: Secondary | ICD-10-CM

## 2024-05-14 NOTE — Patient Instructions (Signed)
 Sabrina [name] , Thank you for taking time out of your busy schedule to complete your Annual Wellness Visit with me. I enjoyed our conversation and look forward to speaking with you again next year. I, as well as your care team,  appreciate your ongoing commitment to your health goals. Please review the following plan we discussed and let me know if I can assist you in the future. Your Game plan/ To Do List    Referrals: If you haven't heard from the office you've been referred to, please reach out to them at the phone provided.  Remember to call and schedule an eye appointment and dexa/bone density scan. Consider updating your vaccines. You have an order for:  []   2D Mammogram  []   3D Mammogram  [x]   Bone Density     Please call for appointment:  [facility]  [address] [place] [address] [phone]  Managing Pain Without Opioids Opioids are strong medicines used to treat moderate to severe pain. For some people, especially those who have long-term (chronic) pain, opioids may not be the best choice for pain management due to: Side effects like nausea, constipation, and sleepiness. The risk of addiction (opioid use disorder). The longer you take opioids, the greater your risk of addiction. Pain that lasts for more than 3 months is called chronic pain. Managing chronic pain usually requires more than one approach and is often provided by a team of health care providers working together (multidisciplinary approach). Pain management may be done at a pain management center or pain clinic. How to manage pain without the use of opioids Use non-opioid medicines Non-opioid medicines for pain may include: Over-the-counter or prescription non-steroidal anti-inflammatory drugs (NSAIDs). These may be the first medicines used for pain. They work well for muscle and bone pain, and they reduce swelling. Acetaminophen . This over-the-counter  medicine may work well for milder pain but not swelling. Antidepressants. These may be used to treat chronic pain. A certain type of antidepressant (tricyclics) is often used. These medicines are given in lower doses for pain than when used for depression. Anticonvulsants. These are usually used to treat seizures but may also reduce nerve (neuropathic) pain. Muscle relaxants. These relieve pain caused by sudden muscle tightening (spasms). You may also use a pain medicine that is applied to the skin as a patch, cream, or gel (topical analgesic), such as a numbing medicine. These may cause fewer side effects than medicines taken by mouth. Do certain therapies as directed Some therapies can help with pain management. They include: Physical therapy. You will do exercises to gain strength and flexibility. A physical therapist may teach you exercises to move and stretch parts of your body that are weak, stiff, or painful. You can learn these exercises at physical therapy visits and practice them at home. Physical therapy may also involve: Massage. Heat wraps or applying heat or cold to affected areas. Electrical signals that interrupt pain signals (transcutaneous electrical nerve stimulation, TENS). Weak lasers that reduce pain and swelling (low-level laser therapy). Signals from your body that help you learn to regulate pain (biofeedback). Occupational therapy. This helps you to learn ways to function at home and work with less pain. Recreational therapy. This involves trying new activities or hobbies, such as a physical activity or drawing. Mental health therapy, including: Cognitive behavioral therapy (CBT). This helps you learn coping skills for dealing with pain. Acceptance and commitment therapy (ACT) to change the way you  think and react to pain. Relaxation therapies, including muscle relaxation exercises and mindfulness-based stress reduction. Pain management counseling. This may be individual,  family, or group counseling.  Receive medical treatments Medical treatments for pain management include: Nerve block injections. These may include a pain blocker and anti-inflammatory medicines. You may have injections: Near the spine to relieve chronic back or neck pain. Into joints to relieve back or joint pain. Into nerve areas that supply a painful area to relieve body pain. Into muscles (trigger point injections) to relieve some painful muscle conditions. A medical device placed near your spine to help block pain signals and relieve nerve pain or chronic back pain (spinal cord stimulation device). Acupuncture. Follow these instructions at home Medicines Take over-the-counter and prescription medicines only as told by your health care provider. If you are taking pain medicine, ask your health care providers about possible side effects to watch out for. Do not drive or use heavy machinery while taking prescription opioid pain medicine. Lifestyle  Do not use drugs or alcohol to reduce pain. If you drink alcohol, limit how much you have to: 0-1 drink a day for women who are not pregnant. 0-2 drinks a day for men. Know how much alcohol is in a drink. In the [place] one drink equals one 12 oz bottle of beer (355 mL), one 5 oz glass of wine (148 mL), or one 1 oz glass of hard liquor (44 mL). Do not use any products that contain nicotine or tobacco. These products include cigarettes, chewing tobacco, and vaping devices, such as e-cigarettes. If you need help quitting, ask your health care provider. Eat a healthy diet and maintain a healthy weight. Poor diet and excess weight may make pain worse. Eat foods that are high in fiber. These include fresh fruits and vegetables, whole grains, and beans. Limit foods that are high in fat and processed sugars, such as fried and sweet foods. Exercise regularly. Exercise lowers stress and may help relieve pain. Ask your health care provider what activities  and exercises are safe for you. If your health care provider approves, join an exercise class that combines movement and stress reduction. Examples include yoga and tai chi. Get enough sleep. Lack of sleep may make pain worse. Lower stress as much as possible. Practice stress reduction techniques as told by your therapist.  General instructions Work with all your pain management providers to find the treatments that work best for you. You are an important member of your pain management team. There are many things you can do to reduce pain on your own. Consider joining an online or in-person support group for people who have chronic pain. Keep all follow-up visits. This is important. Where to find more information You can find more information about managing pain without opioids from: American Academy of Pain Medicine: painmed.org Institute for Chronic Pain: instituteforchronicpain.org American Chronic Pain Association: theacpa.org Contact a health care provider if: You have side effects from pain medicine. Your pain gets worse or does not get better with treatments or home therapy. You are struggling with anxiety or depression. Summary Many types of pain can be managed without opioids. Chronic pain may respond better to pain management without opioids. Pain is best managed when you and a team of health care providers work together. Pain management without opioids may include non-opioid medicines, medical treatments, physical therapy, mental health therapy, and lifestyle changes. Tell your health care providers if your pain gets worse or is not being managed well enough.  This information is not intended to replace advice given to you by your health care provider. Make sure you discuss any questions you have with your health care provider. Document Revised: 01/21/2021 Document Reviewed: 01/21/2021 Elsevier Patient Education  2024 [employer]. Make sure to wear two-piece clothing.  No  lotions, powders, or deodorants the day of the appointment. Make sure to bring picture ID and insurance card.  Bring list of medications you are currently taking including any supplements.   Follow up Visits: Next Medicare AWV with our clinical staff: 05/21/25 @ 3:40   Have you seen your provider in the last 6 months (3 months if uncontrolled diabetes)? Yes Next Office Visit with your provider: The office will call and schedule you a follow-up  appointment with your doctor  Clinician Recommendations:  Aim for 30 minutes of exercise or brisk walking, 6-8 glasses of water, and 5 servings of fruits and vegetables each day.       This is a list of the screening recommended for you and due dates:  Health Maintenance  Topic Date Due   Zoster (Shingles) Vaccine (1 of 2) Never done   DEXA scan (bone density measurement)  Never done   Pneumococcal Vaccine for age over 89 (2 of 2 - PPSV23, PCV20, or PCV21) 02/21/2014   DTaP/Tdap/Td vaccine (2 - Td or Tdap) 01/11/2024   COVID-19 Vaccine (4 - 2024-25 season) 05/17/2024*   Flu Shot  05/25/2024   Mammogram  01/17/2025   Medicare Annual Wellness Visit  05/14/2025   Hepatitis B Vaccine  Aged Out   HPV Vaccine  Aged Out   Meningitis B Vaccine  Aged Out  *Topic was postponed. The date shown is not the original due date.    Advanced directives: (Declined) Advance directive discussed with you today. Even though you declined this today, please call our office should you change your mind, and we can give you the proper paperwork for you to fill out. Advance Care Planning is important because it:  [x]  Makes sure you receive the medical care that is consistent with your values, goals, and preferences  [x]  It provides guidance to your family and loved ones and reduces their decisional burden about whether or not they are making the right decisions based on your wishes.

## 2024-05-14 NOTE — Progress Notes (Signed)
 Subjective:   Sabrina [name] is a 83 y.o. who presents for a Medicare Wellness preventive visit.  As a reminder, Annual Wellness Visits don't include a physical exam, and some assessments may be limited, especially if this visit is performed virtually. We may recommend an in-person follow-up visit with your provider if needed.  Visit Complete: Virtual I connected with  Sabrina [name] on 05/14/24 by a audio enabled telemedicine application and verified that I am speaking with the correct person using two identifiers.  Patient Location: Home  Provider Location: Home Office  I discussed the limitations of evaluation and management by telemedicine. The patient expressed understanding and agreed to proceed.  Vital Signs: Because this visit was a virtual/telehealth visit, some criteria may be missing or patient reported. Any vitals not documented were not able to be obtained and vitals that have been documented are patient reported.  VideoDeclined- This patient declined Librarian, academic. Therefore the visit was completed with audio only.  Persons Participating in Visit: Patient.  AWV Questionnaire: No: Patient Medicare AWV questionnaire was not completed prior to this visit.  Cardiac Risk Factors include: advanced age (>36men, >32 women);dyslipidemia;hypertension;obesity (BMI >30kg/m2)     Objective:    Today's Vitals   05/14/24 1500  Weight: 167 lb (75.8 kg)  Height: 5' (1.524 m)   Body mass index is 32.61 kg/m.     05/14/2024    3:17 PM 05/01/2024    2:23 PM 02/14/2024   11:53 AM 11/28/2023    1:03 PM 09/13/2023    2:46 PM 08/01/2023    2:23 PM 07/21/2023    2:06 PM  Advanced Directives  Does Patient Have a Medical Advance Directive? No No No No No No No  Would patient like information on creating a medical advance directive? No - Patient declined No - Patient declined  No - Patient declined  No - Patient declined     Current  Medications (verified) Outpatient Encounter Medications as of 05/14/2024  Medication Sig   amLODipine  (NORVASC ) 10 MG tablet TAKE 1 TABLET BY MOUTH AT BEDTIME   aspirin  81 MG tablet Take 81 mg by mouth in the morning.   betamethasone  dipropionate 0.05 % cream Apply topically 2 (two) times daily. To affected area   clopidogrel  (PLAVIX ) 75 MG tablet Take 1 tablet (75 mg total) by mouth at bedtime. To prevent strokes   DULoxetine  (CYMBALTA ) 20 MG capsule Take 1 capsule (20 mg total) by mouth daily. For chronic pain, anxiety and depression   ezetimibe  (ZETIA ) 10 MG tablet Take 1 tablet (10 mg total) by mouth at bedtime. For cholesterol   ferrous sulfate  325 (65 FE) MG EC tablet Take 1 tablet (325 mg total) by mouth daily.   furosemide  (LASIX ) 20 MG tablet Take 1 tablet (20 mg total) by mouth daily. For fluid retention   hydrALAZINE  (APRESOLINE ) 50 MG tablet TAKE 1 TABLET BY MOUTH THREE TIMES DAILY AS NEEDED FOR BLOOD PRESSURE GREATER THAN 150   HYDROcodone -acetaminophen  (NORCO/VICODIN) 5-325 MG tablet Take 1 tablet by mouth every 8 (eight) hours as needed for severe pain (pain score 7-10). Must last 30 days.   hydrOXYzine  (VISTARIL ) 25 MG capsule Take 1 capsule (25 mg total) by mouth every 6 (six) hours as needed.   labetalol  (NORMODYNE ) 300 MG tablet TAKE 1 TABLET BY MOUTH TWICE DAILY FOR HIGH BLOOD PRESSURE   losartan  (COZAAR ) 50 MG tablet Take 1 tablet (50 mg total) by mouth at bedtime. For blood  pressure   methocarbamol  (ROBAXIN ) 500 MG tablet Take 1 tablet (500 mg total) by mouth every 8 (eight) hours as needed for muscle spasms.   mirabegron  ER (MYRBETRIQ ) 50 MG TB24 tablet Take 1 tablet (50 mg total) by mouth daily.   mirabegron  ER (MYRBETRIQ ) 50 MG TB24 tablet Take 1 tablet (50 mg total) by mouth daily.   nortriptyline  (PAMELOR ) 10 MG capsule Take 1 capsule (10 mg total) by mouth 2 (two) times daily. For chronic headaches   pantoprazole  (PROTONIX ) 40 MG tablet One tablet upon waking,  take 30  minuts prior to eating   ranolazine  (RANEXA ) 500 MG 12 hr tablet Take 1 tablet (500 mg total) by mouth 2 (two) times daily. For chronic chest pain   vitamin C  (ASCORBIC ACID ) 250 MG tablet Take 1 tablet (250 mg total) by mouth daily.   No facility-administered encounter medications on file as of 05/14/2024.    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 b.) TTE 11/16/2021: EF >55%; mild AS (MPG 9 mmHg)   CAD (coronary artery disease)    a.) s/p 3v CABG 03/29/2000   Cardiac arrest [facility])    Carotid artery stenosis    a.) s/p LEFT CEA 09/09/2003. b.) Carotid doppler 88/78/7980: 1-39% LICA, CTO RICA; subclavian stenosis   Cataracts, bilateral    Cervical spondylosis without myelopathy    Chronic diastolic CHF (congestive heart failure), NYHA class 3 (HCC)    a.) TTE 05/27/2016: EF >55%, mild LA enlargement, triv PR, mild MR, mod TR; G3DD. b.) TTE 12/12/2017: EF >55%, mild LVH, BAE, mild MR/PR, mod TR; RVSP 52.8 mmHg. c.) TTE 03/18/2020: EF >55%, BAD, AS (MPG 8.7 mmHg); triv MR, mild TR/PR. d.) TTE 11/16/2021: EF >55%, LVH, G1DD, triv MR, mild PR, mod TR; AS (MPG 9 mmHg); MS (MPG 5 mmHg)   Chronic kidney disease, stage III (moderate) (HCC)    Chronic narcotic use 06/24/2014   Chronic pain syndrome    GERD (gastroesophageal reflux disease)    History of 2019 novel coronavirus disease (COVID-19)  09/22/2021   Hyperlipidemia    Hypertension    Long term current use of antithrombotics/antiplatelets    a.) on daily DAPT therapy (ASA + clopidogrel )   Lumbar stenosis with neurogenic claudication    Mitral stenosis 11/16/2021   a.) TTE 11/16/2021: EF >55%; mod MS (MPG 5 mmHg)   Osteoarthritis of hip    Postoperative wound infection 03/29/2022   Pulmonary hypertension (HCC) 12/12/2017   a.) TTE 12/12/2017: mild; RVSP 52.8 mmHg   PVD (peripheral vascular disease) (HCC)    Renal artery stenosis (HCC)    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   Past Surgical History:  Procedure Laterality Date  ABDOMINAL AORTOGRAM W/LOWER EXTREMITY N/A 10/06/2022   Procedure: ABDOMINAL AORTOGRAM W/LOWER EXTREMITY;  Surgeon: Lanis Fonda BRAVO, MD;  Location: [facility] INVASIVE CV LAB;  Service: Cardiovascular;  Laterality: N/A;   ABDOMINAL AORTOGRAM W/LOWER EXTREMITY N/A 05/04/2023   Procedure: ABDOMINAL AORTOGRAM W/LOWER EXTREMITY;  Surgeon: Lanis Fonda BRAVO, MD;  Location: [facility] INVASIVE CV LAB;  Service: Cardiovascular;  Laterality: N/A;   ABDOMINAL HYSTERECTOMY  1976   CAROTID ARTERY ANGIOPLASTY Left    CAROTID ENDARTERECTOMY Left 09/09/2003   Procedure: CAROTID ENDARTERECTOMY; Location: [facility]; Surgeon: Charlie Lango, MD   COLONOSCOPY WITH PROPOFOL  N/A 08/16/2017   Procedure: COLONOSCOPY WITH PROPOFOL ;  Surgeon: Jinny Carmine, MD;  Location: [facility] ENDOSCOPY;  Service: Endoscopy;  Laterality: N/A;   CORONARY ANGIOPLASTY WITH STENT PLACEMENT  2000   CORONARY ARTERY BYPASS GRAFT N/A 03/29/2000   Procedure: 3v CORONARY ARTERY BYPASS GRAFT; Location: [facility]   CYSTOSCOPY WITH STENT PLACEMENT Bilateral    ENDARTERECTOMY FEMORAL Bilateral 03/17/2022   Procedure: ENDARTERECTOMY FEMORAL ( BILATERAL SFA STENT);   Surgeon: Jama Cordella MATSU, MD;  Location: [facility] ORS;  Service: Vascular;  Laterality: Bilateral;   ENDARTERECTOMY FEMORAL Right 03/30/2022   Procedure: RE-EXPOSURE OF RIGHT COMMON FEMORAL ARTERY, RE-DO RIGHT FEMORAL ENDARTERECTOMY WITH VEIN PATCH;  Surgeon: Lanis Fonda BRAVO, MD;  Location: [facility] OR;  Service: Vascular;  Laterality: Right;  WOUND VAC   ESOPHAGOGASTRODUODENOSCOPY (EGD) WITH PROPOFOL  N/A 08/16/2017   Procedure: ESOPHAGOGASTRODUODENOSCOPY (EGD) WITH PROPOFOL ;  Surgeon: Jinny Carmine, MD;  Location: [facility] ENDOSCOPY;  Service: Endoscopy;  Laterality: N/A;   ESOPHAGOGASTRODUODENOSCOPY (EGD) WITH PROPOFOL  N/A 06/29/2018   Procedure: ESOPHAGOGASTRODUODENOSCOPY (EGD) WITH PROPOFOL ;  Surgeon: Janalyn Keene NOVAK, MD;  Location: [facility] ENDOSCOPY;  Service: Endoscopy;  Laterality: N/A;   GROIN DEBRIDEMENT Right 03/30/2022   Procedure: RIGHT GROIN IRRIGATION & DEBRIDEMENT;  Surgeon: Lanis Fonda BRAVO, MD;  Location: [facility] OR;  Service: Vascular;  Laterality: Right;   INSERTION OF ILIAC STENT Left 03/17/2022   Procedure: INSERTION OF ILIAC STENT;  Surgeon: Jama Cordella MATSU, MD;  Location: [facility] ORS;  Service: Vascular;  Laterality: Left;   LAPAROSCOPIC CHOLECYSTECTOMY Left 10/26/1999   Procedure: LAPAROSCOPIC CHOLECYSTECTOMY; Location: [facility]; Surgeon: Unknown Sharps, MD   LEFT HEART CATH AND CORONARY ANGIOGRAPHY N/A 11/18/2021   Procedure: LEFT HEART CATH AND CORONARY ANGIOGRAPHY;  Surgeon: Florencio Cara BIRCH, MD;  Location: [facility] INVASIVE CV LAB;  Service: Cardiovascular;  Laterality: N/A;   LEFT HEART CATH AND CORS/GRAFTS ANGIOGRAPHY Left 11/19/2002   Procedure: LEFT HEART CATH AND CORS/GRAFTS ANGIOGRAPHY; Location: [facility]; Surgeon: Margie Florencio, MD   LEFT HEART CATH AND CORS/GRAFTS ANGIOGRAPHY Left 09/17/2003   Procedure: LEFT HEART CATH AND CORS/GRAFTS ANGIOGRAPHY; Location: [facility]; Surgeon: Margie Florencio, MD   LOWER EXTREMITY ANGIOGRAPHY Right 01/06/2022   Procedure: Lower Extremity Angiography;   Surgeon: Jama Cordella MATSU, MD;  Location: [facility] INVASIVE CV LAB;  Service: Cardiovascular;  Laterality: Right;   PERIPHERAL VASCULAR BALLOON ANGIOPLASTY  10/06/2022   Procedure: PERIPHERAL VASCULAR BALLOON ANGIOPLASTY;  Surgeon: Lanis Fonda BRAVO, MD;  Location: [facility] INVASIVE CV LAB;  Service: Cardiovascular;;  left iliac and bilateral renal   RENAL ARTERY ANGIOPLASTY Bilateral 12/2013   TOE AMPUTATION Right    small toe   TONSILLECTOMY AND ADENOIDECTOMY     TOTAL HIP ARTHROPLASTY Left 2005   TOTAL HIP ARTHROPLASTY Right 2015   UPPER EXTREMITY ANGIOGRAPHY Left 11/23/2021   Procedure: UPPER EXTREMITY ANGIOGRAPHY;  Surgeon: Marea Selinda RAMAN, MD;  Location: [facility] INVASIVE CV LAB;  Service: Cardiovascular;  Laterality: Left;   Family History  Problem Relation  Age of Onset   Stroke Mother    Hypertension Mother    Diabetes Mother    Hypertension Father    Heart disease Sister        MI   Multiple sclerosis Daughter    Multiple sclerosis Son    Cerebral aneurysm Son    Seizures Son    Cerebral aneurysm Son    Breast cancer Paternal Aunt 67   Social History   Socioeconomic History   Marital status: Divorced    Spouse name: Not on file   Number of children: 3   Years of education: Not on file   Highest education level: Not on file  Occupational History   Occupation: retired  Tobacco Use   Smoking status: Former    Current packs/day: 0.00    Types: Cigarettes    Quit date: 10/25/1998    Years since quitting: 25.5    Passive exposure: Past   Smokeless tobacco: Never  Vaping Use   Vaping status: Never Used  Substance and Sexual Activity   Alcohol use: No    Alcohol/week: 0.0 standard drinks of alcohol   Drug use: Never   Sexual activity: Not Currently  Other Topics Concern   Not on file  Social History Narrative   Daughter Grayce ([place]); 1 in [employer]; 1 in [place]   Lives alone   Social Drivers of Health   Financial Resource Strain: Low Risk  (05/14/2024)   Overall  Financial Resource Strain (CARDIA)    Difficulty of Paying Living Expenses: Not hard at all  Food Insecurity: No Food Insecurity (05/14/2024)   Hunger Vital Sign    Worried About Running Out of Food in the Last Year: Never true    Ran Out of Food in the Last Year: Never true  Transportation Needs: No Transportation Needs (05/14/2024)   PRAPARE - Administrator, Civil Service (Medical): No    Lack of Transportation (Non-Medical): No  Physical Activity: Inactive (05/14/2024)   Exercise Vital Sign    Days of Exercise per Week: 1 day    Minutes of Exercise per Session: 0 min  Stress: No Stress Concern Present (05/14/2024)   [employer] of Occupational Health - Occupational Stress Questionnaire    Feeling of Stress: Not at all  Social Connections: Moderately Integrated (05/14/2024)   Social Connection and Isolation Panel    Frequency of Communication with Friends and Family: More than three times a week    Frequency of Social Gatherings with Friends and Family: More than three times a week    Attends Religious Services: More than 4 times per year    Active Member of [employer] or Organizations: Yes    Attends Engineer, structural: More than 4 times per year    Marital Status: Divorced    Tobacco Counseling Counseling given: Not Answered    Clinical Intake:  Pre-visit preparation completed: Yes  Pain : No/denies pain     BMI - recorded: 32.61 Nutritional Status: BMI > 30  Obese Nutritional Risks: None Diabetes: No  Lab Results  Component Value Date   HGBA1C 5.9 07/19/2016   HGBA1C 5.8 06/02/2015     How often do you need to have someone help you when you read instructions, pamphlets, or other written materials from your doctor or pharmacy?: 1 - Never  Interpreter Needed?: No  Comments: R. Somalia Segler LPN   Activities of Daily Living     05/14/2024    3:02 PM  In  your present state of health, do you have any difficulty performing the following  activities:  Hearing? 0  Vision? 0  Comment readers  Difficulty concentrating or making decisions? 0  Walking or climbing stairs? 1  Dressing or bathing? 0  Doing errands, shopping? 0  Preparing Food and eating ? N  Using the Toilet? N  In the past six months, have you accidently leaked urine? Y  Do you have problems with loss of bowel control? N  Managing your Medications? N  Managing your Finances? N  Housekeeping or managing your Housekeeping? N    Patient Care Team: [name], MD as PCP - General (Internal Medicine) Babara Call, MD as Consulting Physician (Oncology)  I have updated your Care Teams any recent Medical Services you may have received from other providers in the past year.     Assessment:   This is a routine wellness examination for Panayiota.  Hearing/Vision screen Hearing Screening - Comments:: No issues Vision Screening - Comments:: readers   Goals Addressed             This Visit's Progress    Patient Stated       Wants to be able to walk a track       Depression Screen     05/14/2024    3:12 PM 05/02/2024    1:37 PM 05/01/2024    2:23 PM 02/14/2024   11:53 AM 01/03/2024    3:25 PM 12/27/2023    2:08 PM 10/28/2023    3:35 PM  PHQ 2/9 Scores  PHQ - 2 Score 0 0 2 1 1  0 1  PHQ- 9 Score 6 6     7     Fall Risk     05/14/2024    3:04 PM 05/02/2024    1:36 PM 05/01/2024    2:22 PM 02/14/2024   11:53 AM 01/03/2024    3:25 PM  Fall Risk   Falls in the past year? 0 0 1 0 0  Number falls in past yr: 0 0 1  0  Injury with Fall? 0 0 0  0  Risk for fall due to : No Fall Risks No Fall Risks   No Fall Risks  Follow up Falls evaluation completed;Falls prevention discussed Falls evaluation completed   Falls evaluation completed    MEDICARE RISK AT HOME:  Medicare Risk at Home Any stairs in or around the home?: No If so, are there any without handrails?: No Home free of loose throw rugs in walkways, pet beds, electrical cords, etc?: Yes Adequate lighting  in your home to reduce risk of falls?: Yes Life alert?: Yes Use of a cane, walker or w/c?: No Grab bars in the bathroom?: No Shower chair or bench in shower?: Yes Elevated toilet seat or a handicapped toilet?: No  TIMED UP AND GO:  Was the test performed?  No  Cognitive Function: 6CIT completed    09/22/2017    3:38 PM  MMSE - Mini Mental State Exam  Orientation to time 5   Orientation to Place 5   Registration 3   Attention/ Calculation 5   Recall 3   Language- name 2 objects 2   Language- repeat 1  Language- follow 3 step command 3   Language- read & follow direction 1   Write a sentence 1   Copy design 1   Total score 30      Data saved with a previous flowsheet row definition  05/14/2024    3:17 PM 05/10/2023    2:48 PM 09/22/2016    3:43 PM  6CIT Screen  What Year? 0 points 0 points 0 points  What month? 0 points 0 points 0 points  What time? 0 points 0 points 0 points  Count back from 20 0 points 0 points 0 points  Months in reverse 2 points 2 points 0 points  Repeat phrase 0 points 4 points 0 points  Total Score 2 points 6 points 0 points    Immunizations Immunization History  Administered Date(s) Administered   Fluad Quad(high Dose 65+) 07/31/2019, 07/24/2020, 07/06/2021, 08/05/2022   Influenza, High Dose Seasonal PF 08/14/2018   Influenza,inj,Quad PF,6+ Mos 06/24/2014, 08/26/2016, 09/22/2017   Moderna Sars-Covid-2 Vaccination 09/24/2020   PFIZER(Purple Top)SARS-COV-2 Vaccination 12/22/2019, 01/12/2020   Pneumococcal Conjugate-13 12/27/2013   Tdap 01/10/2014    Screening Tests Health Maintenance  Topic Date Due   Zoster Vaccines- Shingrix (1 of 2) Never done   DEXA SCAN  Never done   Pneumococcal Vaccine: 50+ Years (2 of 2 - PPSV23, PCV20, or PCV21) 02/21/2014   DTaP/Tdap/Td (2 - Td or Tdap) 01/11/2024   COVID-19 Vaccine (4 - 2024-25 season) 05/17/2024 (Originally 06/26/2023)   INFLUENZA VACCINE  05/25/2024   MAMMOGRAM  01/17/2025    Medicare Annual Wellness (AWV)  05/14/2025   Hepatitis B Vaccines  Aged Out   HPV VACCINES  Aged Out   Meningococcal B Vaccine  Aged Out    Health Maintenance  Health Maintenance Due  Topic Date Due   Zoster Vaccines- Shingrix (1 of 2) Never done   DEXA SCAN  Never done   Pneumococcal Vaccine: 50+ Years (2 of 2 - PPSV23, PCV20, or PCV21) 02/21/2014   DTaP/Tdap/Td (2 - Td or Tdap) 01/11/2024   Health Maintenance Items Addressed: Discussed the need to update vaccines which patient declines at this time.  Patient was advised that an order was placed for a Dexa 01/03/24 and telephone number was given for her to call and schedule.  Additional Screening:  Discussed the need to update pneumonia, tetanus and shingles vaccines. Reminded patient to call and schedule her Dexa scan that was ordered 01/03/24 telephone number provided  Vision Screening: Recommended annual ophthalmology exams for early detection of glaucoma and other disorders of the eye. Overdue   Dr. Carolee  Patient was advised that she needs to call and schedule an eye exam. Would you like a referral to an eye doctor? No    Dental Screening: Recommended annual dental exams for proper oral hygiene  Community Resource Referral / Chronic Care Management: CRR required this visit?  No   CCM required this visit?  No   Plan:    I have personally reviewed and noted the following in the patient's chart:   Medical and social history Use of alcohol, tobacco or illicit drugs  Current medications and supplements including opioid prescriptions. Patient is currently taking opioid prescriptions. Information provided to patient regarding non-opioid alternatives. Patient advised to discuss non-opioid treatment plan with their provider. Functional ability and status Nutritional status Physical activity Advanced directives List of other physicians Hospitalizations, surgeries, and ER visits in previous 12 months Vitals Screenings to  include cognitive, depression, and falls Referrals and appointments  In addition, I have reviewed and discussed with patient certain preventive protocols, quality metrics, and best practice recommendations. A written personalized care plan for preventive services as well as general preventive health recommendations were provided to patient.   Angeline Fredericks, LPN  05/14/2024   After Visit Summary: (MyChart) Due to this being a telephonic visit, the after visit summary with patients personalized plan was offered to patient via MyChart   Notes: Nothing significant to report at this time.

## 2024-05-17 ENCOUNTER — Ambulatory Visit
Admission: RE | Admit: 2024-05-17 | Discharge: 2024-05-17 | Disposition: A | Discharge status: Home / Self Care | Source: Ambulatory Visit | Attending: Internal Medicine | Admitting: Internal Medicine

## 2024-05-17 DIAGNOSIS — M47816 Spondylosis without myelopathy or radiculopathy, lumbar region: Secondary | ICD-10-CM | POA: Diagnosis not present

## 2024-05-17 DIAGNOSIS — M5137 Other intervertebral disc degeneration, lumbosacral region with discogenic back pain only: Secondary | ICD-10-CM | POA: Diagnosis not present

## 2024-05-17 DIAGNOSIS — I77811 Abdominal aortic ectasia: Secondary | ICD-10-CM | POA: Diagnosis not present

## 2024-05-17 DIAGNOSIS — G894 Chronic pain syndrome: Secondary | ICD-10-CM | POA: Insufficient documentation

## 2024-05-17 DIAGNOSIS — M4316 Spondylolisthesis, lumbar region: Secondary | ICD-10-CM | POA: Diagnosis not present

## 2024-05-18 ENCOUNTER — Telehealth: Payer: Self-pay | Admitting: Urology

## 2024-05-18 ENCOUNTER — Other Ambulatory Visit: Payer: Self-pay

## 2024-05-18 NOTE — Telephone Encounter (Signed)
 Pt was informed that Dr.MacDiarmid wanted her to stop taking Gemtesa . She was to continue taking Myrbetriq . Pt voiced understanding

## 2024-05-18 NOTE — Telephone Encounter (Signed)
 Patient called regarding prescription for Myrbetriq  that was prescribed at her last appointment on 05/07/24 with Dr. Gaston. She said he had discontinued it in June, but then prescribed it again. She is confused by this, and would like someone to call her to discuss. She isn't sure if she should take it. Please advise patient.

## 2024-05-18 NOTE — Progress Notes (Signed)
n

## 2024-05-19 ENCOUNTER — Encounter: Payer: Self-pay | Admitting: Internal Medicine

## 2024-05-19 DIAGNOSIS — I10 Essential (primary) hypertension: Secondary | ICD-10-CM

## 2024-05-19 DIAGNOSIS — I130 Hypertensive heart and chronic kidney disease with heart failure and stage 1 through stage 4 chronic kidney disease, or unspecified chronic kidney disease: Secondary | ICD-10-CM

## 2024-05-19 DIAGNOSIS — K219 Gastro-esophageal reflux disease without esophagitis: Secondary | ICD-10-CM

## 2024-05-21 ENCOUNTER — Ambulatory Visit: Payer: Self-pay | Admitting: Internal Medicine

## 2024-05-21 MED ORDER — CLOPIDOGREL BISULFATE 75 MG PO TABS: 75.0000 mg | ORAL_TABLET | Freq: Every day | ORAL | 1 refills | Status: DC

## 2024-05-22 ENCOUNTER — Ambulatory Visit: Payer: Self-pay

## 2024-05-22 DIAGNOSIS — R32 Unspecified urinary incontinence: Secondary | ICD-10-CM

## 2024-05-23 MED ORDER — CIPROFLOXACIN HCL 250 MG PO TABS: 250.0000 mg | ORAL_TABLET | Freq: Two times a day (BID) | ORAL | 0 refills | Status: AC

## 2024-05-29 ENCOUNTER — Ambulatory Visit: Attending: Nurse Practitioner | Admitting: Nurse Practitioner

## 2024-05-29 ENCOUNTER — Encounter: Payer: Self-pay | Admitting: Nurse Practitioner

## 2024-05-29 VITALS — BP 159/59 | HR 117 | Temp 97.9°F | Ht 60.0 in | Wt 167.0 lb

## 2024-05-29 DIAGNOSIS — I739 Peripheral vascular disease, unspecified: Secondary | ICD-10-CM | POA: Diagnosis not present

## 2024-05-29 DIAGNOSIS — G894 Chronic pain syndrome: Secondary | ICD-10-CM | POA: Diagnosis not present

## 2024-05-29 DIAGNOSIS — Z79899 Other long term (current) drug therapy: Secondary | ICD-10-CM | POA: Diagnosis not present

## 2024-05-29 DIAGNOSIS — G8929 Other chronic pain: Secondary | ICD-10-CM | POA: Insufficient documentation

## 2024-05-29 DIAGNOSIS — M5416 Radiculopathy, lumbar region: Secondary | ICD-10-CM | POA: Insufficient documentation

## 2024-05-29 DIAGNOSIS — M4726 Other spondylosis with radiculopathy, lumbar region: Secondary | ICD-10-CM

## 2024-05-29 DIAGNOSIS — M79652 Pain in left thigh: Secondary | ICD-10-CM | POA: Insufficient documentation

## 2024-05-29 DIAGNOSIS — Z95828 Presence of other vascular implants and grafts: Secondary | ICD-10-CM | POA: Diagnosis not present

## 2024-05-29 DIAGNOSIS — Z96643 Presence of artificial hip joint, bilateral: Secondary | ICD-10-CM | POA: Insufficient documentation

## 2024-05-29 DIAGNOSIS — M47816 Spondylosis without myelopathy or radiculopathy, lumbar region: Secondary | ICD-10-CM | POA: Insufficient documentation

## 2024-05-29 MED ORDER — HYDROCODONE-ACETAMINOPHEN 5-325 MG PO TABS: 1.0000 | ORAL_TABLET | Freq: Three times a day (TID) | ORAL | 0 refills | Status: DC | PRN

## 2024-05-29 NOTE — Progress Notes (Signed)
 PROVIDER NOTE: Interpretation of information contained herein should be left to medically-trained personnel. Specific patient instructions are provided elsewhere under Patient Instructions section of medical record. This document was created in part using AI and STT-dictation technology, any transcriptional errors that may result from this process are unintentional.  Patient: Sabrina [name]  Service: E/M   PCP: [name], MD  DOB: October 02, 1941  DOS: 05/29/2024  Provider: Emmy MARLA Blanch, NP  MRN: [id number]  Delivery: Face-to-face  Specialty: Interventional Pain Management  Type: Established Patient  Setting: Ambulatory outpatient facility  Specialty designation: 09  Referring Prov.: [name], MD  Location: Outpatient office facility       History of present illness (HPI) Sabrina [name], a 83 y.o. year old female, is here today because of her Lumbar facet arthropathy [M47.816]. Sabrina [name] primary complain today is Back Pain (lower)  Pertinent problems: Sabrina [name] has Hip pain, bilateral; Peripheral vascular disease (HCC); Generalized anxiety disorder; Chronic right hip pain; Cervical spondylosis without myelopathy; Chronic radicular lumbar pain; and Chronic pain syndrome on their pertinent problem list   Pain Assessment: Severity of Chronic pain is reported as a 10-Worst pain ever/10. Location: Back Right, Left/pain radiaties. Onset: More than a month ago. Quality: Aching, Burning, Constant, Throbbing, Stabbing, Sharp. Timing: Constant. Modifying factor(s): nothing. Vitals:  height is 5' (1.524 m) and weight is 167 lb (75.8 kg). Her temperature is 97.9 F (36.6 C). Her blood pressure is 159/59 (abnormal) and her pulse is 117 (abnormal). Her oxygen saturation is 100%.  BMI: Estimated body mass index is 32.61 kg/m as calculated from the following:   Height as of this encounter: 5' (1.524 m).   Weight as of this encounter: 167 lb (75.8 kg).  Last encounter: 05/01/2024. Last  procedure: Visit date not found.  Reason for encounter: evaluation for possible interventional PM therapy/treatment and medication management. No change in medical history since last visit.  Patient's pain is at baseline.  Patient continues multimodal pain regimen as prescribed.  States that it provides pain relief and improvement in functional status.   Pharmacotherapy Assessment   Hydrocodone -acetaminophen  5-325 mg  every 8 hours as needed for pain. MME=15 Monitoring: [place] PMP: PDMP reviewed during this encounter.       Pharmacotherapy: No side-effects or adverse reactions reported. Compliance: No problems identified. Effectiveness: Clinically acceptable.  Delores Dorothe LABOR, RN  05/29/2024  2:36 PM  Sign when Signing Visit Nursing Pain Medication Assessment:  Safety precautions to be maintained throughout the outpatient stay will include: orient to surroundings, keep bed in low position, maintain call bell within reach at all times, provide assistance with transfer out of bed and ambulation.  Medication Inspection Compliance: Pill count conducted under aseptic conditions, in front of the patient. Neither the pills nor the bottle was removed from the patient's sight at any time. Once count was completed pills were immediately returned to the patient in their original bottle.  Medication: Hydrocodone /APAP Pill/Patch Count: 87 of 90 pills/patches remain Pill/Patch Appearance: Markings consistent with prescribed medication Bottle Appearance: Standard pharmacy container. Clearly labeled. Filled Date: 7 / 8 / 2025 Last Medication intake:  mid of last monthSafety precautions to be maintained throughout the outpatient stay will include: orient to surroundings, keep bed in low position, maintain call bell within reach at all times, provide assistance with transfer out of bed and ambulation.     UDS:  Summary  Date Value Ref Range Status  05/01/2024 FINAL  Final    Comment:     ====================================================================  [facility] Select 13 (MW) ==================================================================== Test                             Result       Flag       Units  Drug Present not Declared for Prescription Verification   Buprenorphine                   11           UNEXPECTED ng/mg creat   Norbuprenorphine               >1852        UNEXPECTED ng/mg creat    Source of buprenorphine  is a scheduled prescription medication.    Norbuprenorphine is an expected metabolite of buprenorphine .  Drug Absent but Declared for Prescription Verification   Hydrocodone                     Not Detected UNEXPECTED ng/mg creat ==================================================================== Test                      Result    Flag   Units      Ref Range   Creatinine              54               mg/dL      >=79 ==================================================================== Declared Medications:  The flagging and interpretation on this report are based on the  following declared medications.  Unexpected results may arise from  inaccuracies in the declared medications.   **Note: The testing scope of this panel includes these medications:   Hydrocodone  (Norco)   **Note: The testing scope of this panel does not include the  following reported medications:   Acetaminophen  (Norco)  Amlodipine  (Norvasc )  Aspirin   Clopidogrel  (Plavix )  Duloxetine  (Cymbalta )  Ezetimibe  (Zetia )  Furosemide  (Lasix )  Hydralazine  (Apresoline )  Hydroxyzine  (Vistaril )  Iron  Normodyne   Topical  Vitamin C  ==================================================================== For clinical consultation, please call [phone]. ====================================================================     No results found for: CBDTHCR No results found for: D8THCCBX No results found for: D9THCCBX  ROS  Constitutional: Denies any fever or  chills Gastrointestinal: No reported hemesis, hematochezia, vomiting, or acute GI distress Musculoskeletal: Denies any acute onset joint swelling, redness, loss of ROM, or weakness Neurological: No reported episodes of acute onset apraxia, aphasia, dysarthria, agnosia, amnesia, paralysis, loss of coordination, or loss of consciousness  Medication Review  DULoxetine , HYDROcodone -acetaminophen , amLODipine , aspirin , betamethasone  dipropionate, ciprofloxacin , clopidogrel , ezetimibe , ferrous sulfate , furosemide , hydrALAZINE , hydrOXYzine , labetalol , losartan , methocarbamol , mirabegron  ER, nortriptyline , pantoprazole , ranolazine , and vitamin C   History Review  Allergy: Sabrina [name] is allergic to celexa  [citalopram ], dilaudid  [hydromorphone  hcl], effient [prasugrel], hydrochlorothiazide , liothyronine , nsaids, nubain [nalbuphine hcl], penicillins, and statins. Drug: Sabrina [name]  reports no history of drug use. Alcohol:  reports no history of alcohol use. Tobacco:  reports that she quit smoking about 25 years ago. Her smoking use included cigarettes. She has been exposed to tobacco smoke. She has never used smokeless tobacco. Social: Sabrina [name]  reports that she quit smoking about 25 years ago. Her smoking use included cigarettes. She has been exposed to tobacco smoke. She has never used smokeless tobacco. She reports that she does not drink alcohol and does not use drugs. Medical:  has a past medical history of Abdominal aortic ectasia (HCC) (07/13/2017), Amputation of fifth toe, right, traumatic, subsequent encounter (HCC) (  06/18/2019), Anemia of chronic kidney failure, Anxiety, Aortic stenosis (03/18/2020), CAD (coronary artery disease), Cardiac arrest [facility]), Carotid artery stenosis, Cataracts, bilateral, Cervical spondylosis without myelopathy, Chronic diastolic CHF (congestive heart failure), NYHA class 3 (HCC), Chronic kidney disease, stage III (moderate) (HCC), Chronic narcotic use (06/24/2014),  Chronic pain syndrome, GERD (gastroesophageal reflux disease), History of 2019 novel coronavirus disease (COVID-19) (09/22/2021), Hyperlipidemia, Hypertension, Long term current use of antithrombotics/antiplatelets, Lumbar stenosis with neurogenic claudication, Mitral stenosis (11/16/2021), Osteoarthritis of hip, Postoperative wound infection (03/29/2022), Pulmonary hypertension (HCC) (12/12/2017), PVD (peripheral vascular disease) (HCC), Renal artery stenosis (HCC), 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 Surgical: Sabrina [name]  has a past surgical history that includes Laparoscopic cholecystectomy (Left, 10/26/1999); Tonsillectomy and adenoidectomy; Abdominal hysterectomy (1976); Total hip arthroplasty (Left, 2005); Carotid angioplasty (Left); Coronary angioplasty with stent (2000); Toe amputation (Right); Cystoscopy with stent placement (Bilateral); Total hip arthroplasty (Right, 2015); Coronary artery bypass graft (N/A, 03/29/2000); Carotid endarterectomy (Left, 09/09/2003); Renal artery angioplasty (Bilateral, 12/2013); Colonoscopy with propofol  (N/A, 08/16/2017); Esophagogastroduodenoscopy (egd) with propofol  (N/A, 08/16/2017); Esophagogastroduodenoscopy (egd) with propofol  (N/A, 06/29/2018); LEFT HEART CATH AND CORONARY ANGIOGRAPHY (N/A, 11/18/2021); Upper Extremity Angiography (Left, 11/23/2021); Lower Extremity Angiography (Right, 01/06/2022); LEFT HEART CATH AND CORS/GRAFTS ANGIOGRAPHY (Left, 11/19/2002); LEFT HEART CATH AND CORS/GRAFTS ANGIOGRAPHY (Left, 09/17/2003); Endarterectomy femoral (Bilateral, 03/17/2022); Insertion of iliac stent (Left, 03/17/2022); Groin debridement (Right, 03/30/2022); Endarterectomy femoral (Right, 03/30/2022); ABDOMINAL AORTOGRAM W/LOWER EXTREMITY (N/A, 10/06/2022); PERIPHERAL VASCULAR BALLOON ANGIOPLASTY (10/06/2022); and ABDOMINAL AORTOGRAM W/LOWER EXTREMITY (N/A, 05/04/2023). Family: family  history includes Breast cancer (age of onset: 41) in her paternal aunt; Cerebral aneurysm in her son and son; Diabetes in her mother; Heart disease in her sister; Hypertension in her father and mother; Multiple sclerosis in her daughter and son; Seizures in her son; Stroke in her mother.  Laboratory Chemistry Profile   Renal Lab Results  Component Value Date   BUN 24 (H) 11/10/2023   CREATININE 1.70 (H) 11/10/2023   LABCREA 86 03/06/2018   GFR 32.48 (L) 08/25/2023   GFRAA 50 (L) 03/10/2020   GFRNONAA 30 (L) 11/10/2023    Hepatic Lab Results  Component Value Date   AST 17 11/10/2023   ALT 11 11/10/2023   ALBUMIN  3.7 11/10/2023   ALKPHOS 73 11/10/2023   LIPASE 16.0 07/06/2017    Electrolytes Lab Results  Component Value Date   NA 135 11/10/2023   K 4.1 11/10/2023   CL 101 11/10/2023   CALCIUM  9.0 11/10/2023   MG 2.5 (H) 03/31/2022   PHOS 3.8 09/23/2022    Bone No results found for: VD25OH, VD125OH2TOT, CI6874NY7, CI7874NY7, 25OHVITD1, 25OHVITD2, 25OHVITD3, TESTOFREE, TESTOSTERONE  Inflammation (CRP: Acute Phase) (ESR: Chronic Phase) Lab Results  Component Value Date   CRP <1.0 02/17/2023   ESRSEDRATE 53 (H) 02/17/2023   LATICACIDVEN 1.0 03/30/2022         Note: Above Lab results reviewed.  Recent Imaging Review  MR Lumbar Spine Wo Contrast CLINICAL DATA:  Low back pain. Symptoms persist with greater than 6 weeks of treatment. Right worse than left leg pain.  EXAM: MRI LUMBAR SPINE WITHOUT CONTRAST  TECHNIQUE: Multiplanar, multisequence MR imaging of the lumbar spine was performed. No intravenous contrast was administered.  COMPARISON:  07/19/2019  FINDINGS: Segmentation: 5 lumbar type vertebral bodies as numbered previously.  Alignment: Chronic degenerative anterolisthesis at L4-5 of 4-5 mm due to chronic facet arthropathy.  Vertebrae: No vertebral body fracture or focal bone lesion. There are some endplate  edematous changes on the  right at L5-S1 that could relate to low back pain, worsened since 2020.  Conus medullaris and cauda equina: Conus extends to the L1 level. Conus and cauda equina appear normal.  Paraspinal and other soft tissues: Ectasia of the infrarenal abdominal aorta with maximal diameter of 2.8 cm. No frank aneurysm.  Disc levels:  No significant finding from T11-12 through L2-3.  L3-4: Mild bulging of the disc. Mild facet and ligamentous hypertrophy. No compressive stenosis. No change.  L4-5: Chronic facet osteoarthritis with degenerative anterolisthesis of 4-5 mm. Some worsening of edema of the facet joints with compared to the study of 2020, suggesting that these could be painful joints and could result in back pain or referred facet syndrome pain. There is mild stenosis of both lateral recesses but without definite focal neural compression.  L5-S1: Bulging of the disc more prominent towards the right. Minimal facet degeneration. No compressive narrowing of the canal or foramina. As noted above, some worsening degenerative endplate edema on the right could relate to low back pain.  IMPRESSION: 1. L4-5: Chronic facet arthropathy with 4-5 mm of anterolisthesis. Some worsening of edema of the facet joints when compared to the study of 2020, suggesting that these could be painful joints and could result in back pain or referred facet syndrome pain. There is mild stenosis of both lateral recesses but without definite focal neural compression. 2. L5-S1: Disc bulge more prominent towards the right. No compressive stenosis. Some worsening of degenerative endplate edema on the right that could relate to low back pain. 3. Ectasia of the infrarenal abdominal aorta with maximal diameter of 2.8 cm. No follow-up recommended.  Electronically Signed   By: Oneil Officer M.D.   On: 05/19/2024 16:10 Note: Reviewed        Physical Exam  Vitals: BP (!) 159/59   Pulse (!) 117   Temp 97.9 F (36.6  C)   Ht 5' (1.524 m)   Wt 167 lb (75.8 kg)   SpO2 100%   BMI 32.61 kg/m  BMI: Estimated body mass index is 32.61 kg/m as calculated from the following:   Height as of this encounter: 5' (1.524 m).   Weight as of this encounter: 167 lb (75.8 kg). Ideal: Ideal body weight: 45.5 kg (100 lb 4.9 oz) Adjusted ideal body weight: 57.6 kg (126 lb 15.8 oz) General appearance: Well nourished, well developed, and well hydrated. In no apparent acute distress Mental status: Alert, oriented x 3 (person, place, & time)       Respiratory: No evidence of acute respiratory distress Eyes: PERLA    Lumbar Exam  Skin & Axial Inspection: No masses, redness, or swelling Alignment: Symmetrical Functional ROM: Decreased ROM affecting both sides Stability: No instability detected Muscle Tone/Strength: Functionally intact. No obvious neuro-muscular anomalies detected. Sensory (Neurological): Musculoskeletal pain pattern Palpation: No palpable anomalies       Provocative Tests: Hyperextension/rotation test: (+) bilaterally for facet joint pain. Lumbar quadrant test (Kemp's test): (+) bilaterally for facet joint pain. Patrick's Maneuver: deferred today                   FABER* test: deferred today                   S-I anterior distraction/compression test: (+)   S-I arthralgia/arthropathy S-I lateral compression test: deferred today         S-I Thigh-thrust test: deferred today         S-I Gaenslen's  test: deferred today         *(Flexion, ABduction and External Rotation)   Gait & Posture Assessment  Ambulation: Limited Gait: Antalgic Posture: Difficulty standing up straight, due to pain    Lower Extremity Exam      Side: Right lower extremity   Side: Left lower extremity  Stability: No instability observed           Stability: No instability observed          Skin & Extremity Inspection: Evidence of prior arthroplastic surgery   Skin & Extremity Inspection: Evidence of prior arthroplastic surgery   Functional ROM: Pain restricted ROM for all joints of the lower extremity           Functional ROM: Pain restricted ROM for all joints of the lower extremity          Muscle Tone/Strength: Functionally intact. No obvious neuro-muscular anomalies detected.   Muscle Tone/Strength: Functionally intact. No obvious neuro-muscular anomalies detected.  Sensory (Neurological): Musculoskeletal pain pattern         Sensory (Neurological): Musculoskeletal pain pattern        DTR: Patellar: deferred today Achilles: deferred today Plantar: deferred today   DTR: Patellar: deferred today Achilles: deferred today Plantar: deferred today  Palpation: No palpable anomalies   Palpation: No palpable anomalies    Assessment   Diagnosis Status  1. Lumbar facet arthropathy   2. Chronic pain syndrome   3. Chronic radicular lumbar pain   4. Medication management   5. Status post bilateral hip replacements   6. Left thigh pain   7. PAD (peripheral artery disease) (HCC)   8. S/P insertion of iliac artery stent    Persistent Controlled Controlled   Updated Problems: No problems updated.  Plan of Care  Problem-specific:  Assessment and Plan We will continue on current medication regimen.  Prescribing drug monitoring (PDMP) reviewed; findings consistent with the use of prescribed medication and no evidence of narcotic misuse or abuse.  Urine drug screening (UDS) up-to-date.  Schedule follow-up in 30 days for medication management.  Future Plan: Lumbar radiofrequency ablation (RFA) #1 with Dr. Marcelino   Sabrina [name] has a current medication list which includes the following long-term medication(s): amlodipine , duloxetine , ezetimibe , ferrous sulfate , furosemide , hydralazine , labetalol , losartan , mirabegron  er, nortriptyline , pantoprazole , and ranolazine .  Pharmacotherapy (Medications Ordered): Meds ordered this encounter  Medications   HYDROcodone -acetaminophen  (NORCO/VICODIN) 5-325 MG tablet     Sig: Take 1 tablet by mouth every 8 (eight) hours as needed for severe pain (pain score 7-10). Must last 30 days.    Dispense:  90 tablet    Refill:  0    Must last 30 days.   Orders:  Orders Placed This Encounter  Procedures   Radiofrequency,Lumbar    Standing Status:   Future    Expiration Date:   08/29/2024    Scheduling Instructions:     Side(s): Bilateral     Level(s): L2, L3, L4, L5, & S1 Medial Branch Nerve(s)     Sedation: With Sedation (IV versed )     Scheduling Timeframe: As soon as pre-approved    Where will this procedure be performed?:   [facility]        Return in about 1 month (around 06/29/2024) for (F2F), (MM), Emmy Blanch NP.    Recent Visits Date Type Provider Dept  05/01/24 Office Visit Camden Knotek K, NP [facility]  Showing recent visits within past 90 days and meeting  all other requirements Today's Visits Date Type Provider Dept  05/29/24 Office Visit Dravon Nott K, NP [facility]  Showing today's visits and meeting all other requirements Future Appointments Date Type Provider Dept  06/26/24 Appointment Avnoor Koury K, NP [facility]  Showing future appointments within next 90 days and meeting all other requirements  I discussed the assessment and treatment plan with the patient. The patient was provided an opportunity to ask questions and all were answered. The patient agreed with the plan and demonstrated an understanding of the instructions.  Patient advised to call back or seek an in-person evaluation if the symptoms or condition worsens.  Duration of encounter: 30 minutes.  Total time on encounter, as per AMA guidelines included both the face-to-face and non-face-to-face time personally spent by the physician and/or other qualified health care professional(s) on the day of the encounter (includes time in activities that require the physician or other qualified health care professional and does not include  time in activities normally performed by clinical staff). Physician's time may include the following activities when performed: Preparing to see the patient (e.g., pre-charting review of records, searching for previously ordered imaging, lab work, and nerve conduction tests) Review of prior analgesic pharmacotherapies. Reviewing PMP Interpreting ordered tests (e.g., lab work, imaging, nerve conduction tests) Performing post-procedure evaluations, including interpretation of diagnostic procedures Obtaining and/or reviewing separately obtained history Performing a medically appropriate examination and/or evaluation Counseling and educating the patient/family/caregiver Ordering medications, tests, or procedures Referring and communicating with other health care professionals (when not separately reported) Documenting clinical information in the electronic or other health record Independently interpreting results (not separately reported) and communicating results to the patient/ family/caregiver Care coordination (not separately reported)  Note by: Lyanna Blystone K Leeanne Butters, NP (TTS and AI technology used. I apologize for any typographical errors that were not detected and corrected.) Date: 05/29/2024; Time: 3:33 PM

## 2024-05-29 NOTE — Patient Instructions (Addendum)
 ______________________________________________________________________    Procedure instructions  Stop blood-thinners  Do not eat or drink fluids (other than water) for 6 hours before your procedure  No water for 2 hours before your procedure  Take your blood pressure medicine with a sip of water  Arrive 30 minutes before your appointment  If sedation is planned, bring suitable driver. [place], [place], & public transportation are NOT APPROVED)  Carefully read the Preparing for your procedure detailed instructions  If you have questions call us  at [phone]  Procedure appointments are for procedures only.   NO medication refills or new problem evaluations will be done on procedure days.   Only the scheduled, pre-approved procedure and side will be done.   ______________________________________________________________________      ______________________________________________________________________    Post-Procedure Discharge Instructions  Instructions: Apply ice:  Purpose: This will minimize any swelling and discomfort after procedure.  When: Day of procedure, as soon as you get home. How: Fill a plastic sandwich bag with crushed ice. Cover it with a small towel and apply to injection site. How long: (15 min on, 15 min off) Apply for 15 minutes then remove x 15 minutes.  Repeat sequence on day of procedure, until you go to bed. Apply heat:  Purpose: To treat any soreness and discomfort from the procedure. When: Starting the next day after the procedure. How: Apply heat to procedure site starting the day following the procedure. How long: May continue to repeat daily, until discomfort goes away. Food intake: Start with clear liquids (like water) and advance to regular food, as tolerated.  Physical activities: Keep activities to a minimum for the first 8 hours after the procedure. After that, then as tolerated. Driving: If you have received any sedation, be  responsible and do not drive. You are not allowed to drive for 24 hours after having sedation. Blood thinner: (Applies only to those taking blood thinners) You may restart your blood thinner 6 hours after your procedure. Insulin : (Applies only to Diabetic patients taking insulin ) As soon as you can eat, you may resume your normal dosing schedule. Infection prevention: Keep procedure site clean and dry. Shower daily and clean area with soap and water. Post-procedure Pain Diary: Extremely important that this be done correctly and accurately. Recorded information will be used to determine the next step in treatment. For the purpose of accuracy, follow these rules: Evaluate only the area treated. Do not report or include pain from an untreated area. For the purpose of this evaluation, ignore all other areas of pain, except for the treated area. After your procedure, avoid taking a long nap and attempting to complete the pain diary after you wake up. Instead, set your alarm clock to go off every hour, on the hour, for the initial 8 hours after the procedure. Document the duration of the numbing medicine, and the relief you are getting from it. Do not go to sleep and attempt to complete it later. It will not be accurate. If you received sedation, it is likely that you were given a medication that may cause amnesia. Because of this, completing the diary at a later time may cause the information to be inaccurate. This information is needed to plan your care. Follow-up appointment: Keep your post-procedure follow-up evaluation appointment after the procedure (usually 2 weeks for most procedures, 6 weeks for radiofrequencies). DO NOT FORGET to bring you pain diary with you.   Expect: (What should I expect to see with my procedure?) From numbing medicine (AKA:  Local Anesthetics): Numbness or decrease in pain. You may also experience some weakness, which if present, could last for the duration of the local  anesthetic. Onset: Full effect within 15 minutes of injected. Duration: It will depend on the type of local anesthetic used. On the average, 1 to 8 hours.  From steroids (Applies only if steroids were used): Decrease in swelling or inflammation. Once inflammation is improved, relief of the pain will follow. Onset of benefits: Depends on the amount of swelling present. The more swelling, the longer it will take for the benefits to be seen. In some cases, up to 10 days. Duration: Steroids will stay in the system x 2 weeks. Duration of benefits will depend on multiple posibilities including persistent irritating factors. Side-effects: If present, they may typically last 2 weeks (the duration of the steroids). Frequent: Cramps (if they occur, drink Gatorade and take over-the-counter Magnesium  450-500 mg once to twice a day); water retention with temporary weight gain; increases in blood sugar; decreased immune system response; increased appetite. Occasional: Facial flushing (red, warm cheeks); mood swings; menstrual changes. Uncommon: Long-term decrease or suppression of natural hormones; bone thinning. (These are more common with higher doses or more frequent use. This is why we prefer that our patients avoid having any injection therapies in other practices.)  Very Rare: Severe mood changes; psychosis; aseptic necrosis. From procedure: Some discomfort is to be expected once the numbing medicine wears off. This should be minimal if ice and heat are applied as instructed.  Call if: (When should I call?) You experience numbness and weakness that gets worse with time, as opposed to wearing off. New onset bowel or bladder incontinence. (Applies only to procedures done in the spine)  Emergency Numbers: Durning business hours (Monday - Thursday, 8:00 AM - 4:00 PM) (Friday, 9:00 AM - 12:00 Noon): [phone] After hours: [phone] NOTE: If you are having a problem and are unable connect with, or to  talk to a provider, then go to your nearest urgent care or emergency department. If the problem is serious and urgent, please call 911. ______________________________________________________________________     Radiofrequency Ablation Radiofrequency ablation is a procedure that is performed to relieve pain. The procedure is often used for back, neck, or arm pain. Radiofrequency ablation involves the use of a machine that creates radio waves to make heat. During the procedure, the heat is applied to the nerve that carries the pain signal. The heat damages the nerve and interferes with the pain signal. Pain relief usually starts about 2 weeks after the procedure and lasts for 6 months to 1 year. Tell a health care provider about: Any allergies you have. All medicines you are taking, including vitamins, herbs, eye drops, creams, and over-the-counter medicines. Any problems you or family members have had with anesthetic medicines. Any bleeding problems you have. Any surgeries you have had. Any medical conditions you have. Whether you are pregnant or may be pregnant. What are the risks? Generally, this is a safe procedure. However, problems may occur, including: Pain or soreness at the injection site. Allergic reaction to medicines given during the procedure. Bleeding. Infection at the injection site. Damage to nerves or blood vessels. What happens before the procedure? When to stop eating and drinking Follow instructions from your health care provider about what you may eat and drink before your procedure. These may include: 8 hours before the procedure Stop eating most foods. Do not eat meat, fried foods, or fatty foods. Eat only light  foods, such as toast or crackers. All liquids are okay except energy drinks and alcohol. 6 hours before the procedure Stop eating. Drink only clear liquids, such as water, clear fruit juice, black coffee, plain tea, and sports drinks. Do not drink energy  drinks or alcohol. 2 hours before the procedure Stop drinking all liquids. You may be allowed to take medicine with small sips of water. If you do not follow your health care provider's instructions, your procedure may be delayed or canceled. Medicines Ask your health care provider about: Changing or stopping your regular medicines. This is especially important if you are taking diabetes medicines or blood thinners. Taking medicines such as aspirin  and ibuprofen . These medicines can thin your blood. Do not take these medicines unless your health care provider tells you to take them. Taking over-the-counter medicines, vitamins, herbs, and supplements. General instructions Ask your health care provider what steps will be taken to help prevent infection. These steps may include: Removing hair at the procedure site. Washing skin with a germ-killing soap. Taking antibiotic medicine. If you will be going home right after the procedure, plan to have a responsible adult: Take you home from the hospital or clinic. You will not be allowed to drive. Care for you for the time you are told. What happens during the procedure?  You will be awake during the procedure. You will need to be able to talk with the health care provider during the procedure. An IV will be inserted into one of your veins. You will be given one or more of the following: A medicine to help you relax (sedative). A medicine to numb the area (local anesthetic). Your health care provider will insert a radiofrequency needle into the area to be treated. This is done with the help of fluoroscopy. A wire that carries the radio waves (electrode) will be put through the radiofrequency needle. An electrical pulse will be sent through the electrode to verify the correct nerve that is causing your pain. You will feel a tingling sensation, and you may have muscle twitching. The tissue around the needle tip will be heated by an electric current  that comes from the radiofrequency machine. This will numb the nerves. The needle will be removed. A bandage (dressing) will be put on the insertion area. The procedure may vary among health care providers and hospitals. What happens after the procedure? Your blood pressure, heart rate, breathing rate, and blood oxygen level will be monitored until you leave the hospital or clinic. Return to your normal activities as told by your health care provider. Ask your health care provider what activities are safe for you. If you were given a sedative during the procedure, it can affect you for several hours. Do not drive or operate machinery until your health care provider says that it is safe. Summary Radiofrequency ablation is a procedure that is performed to relieve pain. The procedure is often used for back, neck, or arm pain. Radiofrequency ablation involves the use of a machine that creates radio waves to make heat. Plan to have a responsible adult take you home from the hospital or clinic. Do not drive or operate machinery until your health care provider says that it is safe. Return to your normal activities as told by your health care provider. Ask your health care provider what activities are safe for you. This information is not intended to replace advice given to you by your health care provider. Make sure you discuss any questions you have  with your health care provider. Document Revised: 03/31/2021 Document Reviewed: 03/31/2021 Elsevier Patient Education  2024 [employer].

## 2024-05-29 NOTE — Progress Notes (Signed)
 Nursing Pain Medication Assessment:  Safety precautions to be maintained throughout the outpatient stay will include: orient to surroundings, keep bed in low position, maintain call bell within reach at all times, provide assistance with transfer out of bed and ambulation.  Medication Inspection Compliance: Pill count conducted under aseptic conditions, in front of the patient. Neither the pills nor the bottle was removed from the patient's sight at any time. Once count was completed pills were immediately returned to the patient in their original bottle.  Medication: Hydrocodone /APAP Pill/Patch Count: 87 of 90 pills/patches remain Pill/Patch Appearance: Markings consistent with prescribed medication Bottle Appearance: Standard pharmacy container. Clearly labeled. Filled Date: 7 / 8 / 2025 Last Medication intake:  mid of last monthSafety precautions to be maintained throughout the outpatient stay will include: orient to surroundings, keep bed in low position, maintain call bell within reach at all times, provide assistance with transfer out of bed and ambulation.

## 2024-05-31 ENCOUNTER — Other Ambulatory Visit: Payer: Self-pay

## 2024-05-31 ENCOUNTER — Inpatient Hospital Stay: Payer: Medicare Other | Attending: Oncology

## 2024-05-31 DIAGNOSIS — G894 Chronic pain syndrome: Secondary | ICD-10-CM | POA: Insufficient documentation

## 2024-05-31 DIAGNOSIS — D472 Monoclonal gammopathy: Secondary | ICD-10-CM | POA: Insufficient documentation

## 2024-05-31 DIAGNOSIS — Z886 Allergy status to analgesic agent status: Secondary | ICD-10-CM | POA: Insufficient documentation

## 2024-05-31 DIAGNOSIS — K219 Gastro-esophageal reflux disease without esophagitis: Secondary | ICD-10-CM | POA: Insufficient documentation

## 2024-05-31 DIAGNOSIS — E785 Hyperlipidemia, unspecified: Secondary | ICD-10-CM | POA: Insufficient documentation

## 2024-05-31 DIAGNOSIS — D631 Anemia in chronic kidney disease: Secondary | ICD-10-CM

## 2024-05-31 DIAGNOSIS — Z90710 Acquired absence of both cervix and uterus: Secondary | ICD-10-CM | POA: Insufficient documentation

## 2024-05-31 DIAGNOSIS — I2510 Atherosclerotic heart disease of native coronary artery without angina pectoris: Secondary | ICD-10-CM | POA: Insufficient documentation

## 2024-05-31 DIAGNOSIS — Z8674 Personal history of sudden cardiac arrest: Secondary | ICD-10-CM | POA: Insufficient documentation

## 2024-05-31 DIAGNOSIS — Z820 Family history of epilepsy and other diseases of the nervous system: Secondary | ICD-10-CM | POA: Insufficient documentation

## 2024-05-31 DIAGNOSIS — Z823 Family history of stroke: Secondary | ICD-10-CM | POA: Insufficient documentation

## 2024-05-31 DIAGNOSIS — Z8616 Personal history of COVID-19: Secondary | ICD-10-CM | POA: Insufficient documentation

## 2024-05-31 DIAGNOSIS — R5383 Other fatigue: Secondary | ICD-10-CM | POA: Insufficient documentation

## 2024-05-31 DIAGNOSIS — Z803 Family history of malignant neoplasm of breast: Secondary | ICD-10-CM | POA: Insufficient documentation

## 2024-05-31 DIAGNOSIS — Z880 Allergy status to penicillin: Secondary | ICD-10-CM | POA: Insufficient documentation

## 2024-05-31 DIAGNOSIS — Z888 Allergy status to other drugs, medicaments and biological substances status: Secondary | ICD-10-CM | POA: Insufficient documentation

## 2024-05-31 DIAGNOSIS — Z833 Family history of diabetes mellitus: Secondary | ICD-10-CM | POA: Insufficient documentation

## 2024-05-31 DIAGNOSIS — M2550 Pain in unspecified joint: Secondary | ICD-10-CM | POA: Insufficient documentation

## 2024-05-31 DIAGNOSIS — Z8249 Family history of ischemic heart disease and other diseases of the circulatory system: Secondary | ICD-10-CM | POA: Insufficient documentation

## 2024-05-31 DIAGNOSIS — N183 Chronic kidney disease, stage 3 unspecified: Secondary | ICD-10-CM | POA: Insufficient documentation

## 2024-05-31 DIAGNOSIS — I130 Hypertensive heart and chronic kidney disease with heart failure and stage 1 through stage 4 chronic kidney disease, or unspecified chronic kidney disease: Secondary | ICD-10-CM | POA: Insufficient documentation

## 2024-05-31 DIAGNOSIS — Z79899 Other long term (current) drug therapy: Secondary | ICD-10-CM | POA: Insufficient documentation

## 2024-05-31 DIAGNOSIS — M48061 Spinal stenosis, lumbar region without neurogenic claudication: Secondary | ICD-10-CM | POA: Diagnosis not present

## 2024-05-31 DIAGNOSIS — Z885 Allergy status to narcotic agent status: Secondary | ICD-10-CM | POA: Insufficient documentation

## 2024-05-31 DIAGNOSIS — Z7902 Long term (current) use of antithrombotics/antiplatelets: Secondary | ICD-10-CM | POA: Insufficient documentation

## 2024-05-31 DIAGNOSIS — Z9049 Acquired absence of other specified parts of digestive tract: Secondary | ICD-10-CM | POA: Insufficient documentation

## 2024-05-31 DIAGNOSIS — Z8269 Family history of other diseases of the musculoskeletal system and connective tissue: Secondary | ICD-10-CM | POA: Insufficient documentation

## 2024-05-31 DIAGNOSIS — I5032 Chronic diastolic (congestive) heart failure: Secondary | ICD-10-CM | POA: Insufficient documentation

## 2024-05-31 DIAGNOSIS — Z87891 Personal history of nicotine dependence: Secondary | ICD-10-CM | POA: Insufficient documentation

## 2024-05-31 DIAGNOSIS — I739 Peripheral vascular disease, unspecified: Secondary | ICD-10-CM | POA: Insufficient documentation

## 2024-06-01 ENCOUNTER — Inpatient Hospital Stay

## 2024-06-05 ENCOUNTER — Encounter: Payer: Self-pay | Admitting: Internal Medicine

## 2024-06-05 ENCOUNTER — Inpatient Hospital Stay

## 2024-06-05 ENCOUNTER — Telehealth: Admitting: Internal Medicine

## 2024-06-05 VITALS — BP 114/68 | Ht 60.0 in | Wt 162.0 lb

## 2024-06-05 DIAGNOSIS — I5032 Chronic diastolic (congestive) heart failure: Secondary | ICD-10-CM | POA: Diagnosis not present

## 2024-06-05 DIAGNOSIS — I130 Hypertensive heart and chronic kidney disease with heart failure and stage 1 through stage 4 chronic kidney disease, or unspecified chronic kidney disease: Secondary | ICD-10-CM | POA: Diagnosis not present

## 2024-06-05 DIAGNOSIS — I739 Peripheral vascular disease, unspecified: Secondary | ICD-10-CM

## 2024-06-05 DIAGNOSIS — N183 Chronic kidney disease, stage 3 unspecified: Secondary | ICD-10-CM

## 2024-06-05 DIAGNOSIS — Z886 Allergy status to analgesic agent status: Secondary | ICD-10-CM | POA: Diagnosis not present

## 2024-06-05 DIAGNOSIS — Z833 Family history of diabetes mellitus: Secondary | ICD-10-CM | POA: Diagnosis not present

## 2024-06-05 DIAGNOSIS — M48061 Spinal stenosis, lumbar region without neurogenic claudication: Secondary | ICD-10-CM | POA: Diagnosis not present

## 2024-06-05 DIAGNOSIS — N3941 Urge incontinence: Secondary | ICD-10-CM

## 2024-06-05 DIAGNOSIS — Z96643 Presence of artificial hip joint, bilateral: Secondary | ICD-10-CM

## 2024-06-05 DIAGNOSIS — I2510 Atherosclerotic heart disease of native coronary artery without angina pectoris: Secondary | ICD-10-CM | POA: Diagnosis not present

## 2024-06-05 DIAGNOSIS — Z8674 Personal history of sudden cardiac arrest: Secondary | ICD-10-CM | POA: Diagnosis not present

## 2024-06-05 DIAGNOSIS — Z7902 Long term (current) use of antithrombotics/antiplatelets: Secondary | ICD-10-CM | POA: Diagnosis not present

## 2024-06-05 DIAGNOSIS — Z90710 Acquired absence of both cervix and uterus: Secondary | ICD-10-CM | POA: Diagnosis not present

## 2024-06-05 DIAGNOSIS — M47816 Spondylosis without myelopathy or radiculopathy, lumbar region: Secondary | ICD-10-CM | POA: Diagnosis not present

## 2024-06-05 DIAGNOSIS — Z9049 Acquired absence of other specified parts of digestive tract: Secondary | ICD-10-CM | POA: Diagnosis not present

## 2024-06-05 DIAGNOSIS — K219 Gastro-esophageal reflux disease without esophagitis: Secondary | ICD-10-CM

## 2024-06-05 DIAGNOSIS — Z79899 Other long term (current) drug therapy: Secondary | ICD-10-CM | POA: Diagnosis not present

## 2024-06-05 DIAGNOSIS — Z8616 Personal history of COVID-19: Secondary | ICD-10-CM | POA: Diagnosis not present

## 2024-06-05 DIAGNOSIS — N1832 Chronic kidney disease, stage 3b: Secondary | ICD-10-CM | POA: Diagnosis not present

## 2024-06-05 DIAGNOSIS — Z87891 Personal history of nicotine dependence: Secondary | ICD-10-CM | POA: Diagnosis not present

## 2024-06-05 DIAGNOSIS — D472 Monoclonal gammopathy: Secondary | ICD-10-CM | POA: Diagnosis not present

## 2024-06-05 DIAGNOSIS — Z880 Allergy status to penicillin: Secondary | ICD-10-CM | POA: Diagnosis not present

## 2024-06-05 DIAGNOSIS — I10 Essential (primary) hypertension: Secondary | ICD-10-CM

## 2024-06-05 DIAGNOSIS — Z888 Allergy status to other drugs, medicaments and biological substances status: Secondary | ICD-10-CM | POA: Diagnosis not present

## 2024-06-05 DIAGNOSIS — G894 Chronic pain syndrome: Secondary | ICD-10-CM

## 2024-06-05 DIAGNOSIS — D631 Anemia in chronic kidney disease: Secondary | ICD-10-CM

## 2024-06-05 DIAGNOSIS — R5383 Other fatigue: Secondary | ICD-10-CM | POA: Diagnosis not present

## 2024-06-05 DIAGNOSIS — Z885 Allergy status to narcotic agent status: Secondary | ICD-10-CM | POA: Diagnosis not present

## 2024-06-05 DIAGNOSIS — E785 Hyperlipidemia, unspecified: Secondary | ICD-10-CM | POA: Diagnosis not present

## 2024-06-05 DIAGNOSIS — M2550 Pain in unspecified joint: Secondary | ICD-10-CM | POA: Diagnosis not present

## 2024-06-05 DIAGNOSIS — I771 Stricture of artery: Secondary | ICD-10-CM | POA: Diagnosis not present

## 2024-06-05 LAB — CBC WITH DIFFERENTIAL (CANCER CENTER ONLY)
Abs Immature Granulocytes: 0.02 K/uL (ref 0.00–0.07)
Basophils Absolute: 0 K/uL (ref 0.0–0.1)
Basophils Relative: 1 %
Eosinophils Absolute: 0.4 K/uL (ref 0.0–0.5)
Eosinophils Relative: 7 %
HCT: 32.3 % — ABNORMAL LOW (ref 36.0–46.0)
Hemoglobin: 10.6 g/dL — ABNORMAL LOW (ref 12.0–15.0)
Immature Granulocytes: 0 %
Lymphocytes Relative: 23 %
Lymphs Abs: 1.4 K/uL (ref 0.7–4.0)
MCH: 29.8 pg (ref 26.0–34.0)
MCHC: 32.8 g/dL (ref 30.0–36.0)
MCV: 90.7 fL (ref 80.0–100.0)
Monocytes Absolute: 0.6 K/uL (ref 0.1–1.0)
Monocytes Relative: 10 %
Neutro Abs: 3.5 K/uL (ref 1.7–7.7)
Neutrophils Relative %: 59 %
Platelet Count: 266 K/uL (ref 150–400)
RBC: 3.56 MIL/uL — ABNORMAL LOW (ref 3.87–5.11)
RDW: 13.2 % (ref 11.5–15.5)
WBC Count: 5.9 K/uL (ref 4.0–10.5)
nRBC: 0 % (ref 0.0–0.2)

## 2024-06-05 LAB — CMP (CANCER CENTER ONLY)
ALT: 13 U/L (ref 0–44)
AST: 19 U/L (ref 15–41)
Albumin: 3.9 g/dL (ref 3.5–5.0)
Alkaline Phosphatase: 86 U/L (ref 38–126)
Anion gap: 9 (ref 5–15)
BUN: 24 mg/dL — ABNORMAL HIGH (ref 8–23)
CO2: 23 mmol/L (ref 22–32)
Calcium: 9.5 mg/dL (ref 8.9–10.3)
Chloride: 104 mmol/L (ref 98–111)
Creatinine: 1.8 mg/dL — ABNORMAL HIGH (ref 0.44–1.00)
GFR, Estimated: 28 mL/min — ABNORMAL LOW (ref >60)
Glucose, Bld: 89 mg/dL (ref 70–99)
Potassium: 3.8 mmol/L (ref 3.5–5.1)
Sodium: 136 mmol/L (ref 135–145)
Total Bilirubin: 0.8 mg/dL (ref 0.0–1.2)
Total Protein: 7.5 g/dL (ref 6.5–8.1)

## 2024-06-05 LAB — IRON AND TIBC
Iron: 62 ug/dL (ref 28–170)
Saturation Ratios: 19 % (ref 10.4–31.8)
TIBC: 328 ug/dL (ref 250–450)
UIBC: 266 ug/dL

## 2024-06-05 LAB — FERRITIN: Ferritin: 186 ng/mL (ref 11–307)

## 2024-06-05 NOTE — Progress Notes (Signed)
 Virtual Visit via Caregility   Note   This format is felt to be most appropriate for this patient at this time.  All issues noted in this document were discussed and addressed.  No physical exam was performed (except for noted visual exam findings with Video Visits).   I connected with  Sabrina [name]  on 06/06/24 at  5:00 PM EDT by a video enabled telemedicine application or telephone and verified that I am speaking with the correct person using two identifiers. Location patient: home Location provider: work or home office Persons participating in the virtual visit: patient, provider  I discussed the limitations, risks, security and privacy concerns of performing an evaluation and management service by telephone and the availability of in person appointments. I also discussed with the patient that there may be a patient responsible charge related to this service. The patient expressed understanding and agreed to proceed.  Reason for visit: medication review  HPI:  83 yr old female with extensive PAD with prior interventions,, chronic mesenteric ischemia ,  CAD/ hypertension,  chronic back pain presents for follow up  Cc: I'm on too many medications.     1) back pain: she is now  seeing pain management and receiving monthly  opiate prescriptions and periodic  ESI .  She has been taking cymbalta  since 2016  for management of pain/neuropathy  2) HTN;  home BP readings have been < 130/80 on regimen of labetalol  , amlodipine  and losartan .  Has not had to use hydralazine  in several weeks.  3) OAB: taking myrbetriq  prescribed by urology 4) CAD:  taking ranexa  and zetia . Denies any recent epsiodes of chest pain and dyspea.  5) PAD:  reviewed last OV with Dr Lanis in Nov 2024.    ROS: See pertinent positives and negatives per HPI.  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 b.) TTE 11/16/2021: EF >55%; mild AS (MPG 9 mmHg)   CAD (coronary artery disease)    a.) s/p 3v CABG 03/29/2000   Cardiac arrest [facility])    Carotid artery stenosis    a.) s/p LEFT CEA 09/09/2003. b.) Carotid doppler 88/78/7980: 1-39% LICA, CTO RICA; subclavian stenosis   Cataracts, bilateral    Cervical spondylosis without myelopathy    Chronic diastolic CHF (congestive heart failure), NYHA class 3 (HCC)    a.) TTE 05/27/2016: EF >55%, mild LA enlargement, triv PR, mild MR, mod TR; G3DD. b.) TTE 12/12/2017: EF >55%, mild LVH, BAE, mild MR/PR, mod TR; RVSP 52.8 mmHg. c.) TTE 03/18/2020: EF >55%, BAD, AS (MPG 8.7 mmHg); triv MR, mild TR/PR. d.) TTE 11/16/2021: EF >55%, LVH, G1DD, triv MR, mild PR, mod TR; AS (MPG 9 mmHg); MS (MPG 5 mmHg)   Chronic kidney disease, stage III (moderate) (HCC)    Chronic narcotic use 06/24/2014   Chronic pain syndrome    GERD (gastroesophageal reflux disease)    History of 2019 novel coronavirus disease (COVID-19) 09/22/2021   Hyperlipidemia    Hypertension    Long term current use of antithrombotics/antiplatelets    a.) on daily DAPT therapy (ASA +  clopidogrel )   Lumbar stenosis with neurogenic claudication    Mitral stenosis 11/16/2021   a.) TTE 11/16/2021: EF >55%; mod MS (MPG 5 mmHg)   Osteoarthritis of hip    Postoperative wound infection 03/29/2022   Pulmonary hypertension (HCC) 12/12/2017   a.) TTE 12/12/2017: mild; RVSP 52.8 mmHg   PVD (peripheral vascular disease) (HCC)    Renal artery stenosis (HCC)    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    Past Surgical History:  Procedure Laterality Date   ABDOMINAL AORTOGRAM W/LOWER EXTREMITY N/A 10/06/2022   Procedure: ABDOMINAL AORTOGRAM W/LOWER EXTREMITY;  Surgeon: Lanis Fonda BRAVO, MD;  Location: [facility] INVASIVE CV LAB;  Service: Cardiovascular;  Laterality: N/A;   ABDOMINAL AORTOGRAM W/LOWER EXTREMITY N/A 05/04/2023   Procedure: ABDOMINAL AORTOGRAM W/LOWER EXTREMITY;  Surgeon: Lanis Fonda BRAVO, MD;  Location: [facility] INVASIVE CV LAB;  Service: Cardiovascular;  Laterality: N/A;   ABDOMINAL HYSTERECTOMY  1976   CAROTID ARTERY ANGIOPLASTY Left    CAROTID ENDARTERECTOMY Left 09/09/2003   Procedure: CAROTID ENDARTERECTOMY; Location: [facility]; Surgeon: Charlie Lango, MD   COLONOSCOPY WITH PROPOFOL  N/A 08/16/2017   Procedure: COLONOSCOPY WITH PROPOFOL ;  Surgeon: Jinny Carmine, MD;  Location: [facility] ENDOSCOPY;  Service: Endoscopy;  Laterality: N/A;   CORONARY ANGIOPLASTY WITH STENT PLACEMENT  2000   CORONARY ARTERY BYPASS GRAFT N/A 03/29/2000   Procedure: 3v CORONARY ARTERY BYPASS GRAFT; Location: [facility]   CYSTOSCOPY WITH STENT PLACEMENT Bilateral    ENDARTERECTOMY FEMORAL Bilateral 03/17/2022   Procedure: ENDARTERECTOMY FEMORAL ( BILATERAL SFA STENT);  Surgeon: Jama Cordella MATSU, MD;  Location: [facility] ORS;  Service: Vascular;  Laterality: Bilateral;   ENDARTERECTOMY FEMORAL Right 03/30/2022   Procedure: RE-EXPOSURE OF RIGHT COMMON FEMORAL ARTERY, RE-DO RIGHT FEMORAL ENDARTERECTOMY WITH VEIN PATCH;  Surgeon: Lanis Fonda BRAVO, MD;  Location[facility] OR;  Service: Vascular;  Laterality: Right;  WOUND VAC   ESOPHAGOGASTRODUODENOSCOPY (EGD) WITH PROPOFOL  N/A 08/16/2017   Procedure: ESOPHAGOGASTRODUODENOSCOPY (EGD) WITH PROPOFOL ;  Surgeon: Jinny Carmine, MD;  Location: [facility] ENDOSCOPY;  Service: Endoscopy;  Laterality: N/A;   ESOPHAGOGASTRODUODENOSCOPY (EGD) WITH PROPOFOL  N/A 06/29/2018   Procedure: ESOPHAGOGASTRODUODENOSCOPY (EGD) WITH  PROPOFOL ;  Surgeon: Janalyn Keene NOVAK, MD;  Location: [facility] ENDOSCOPY;  Service: Endoscopy;  Laterality: N/A;   GROIN DEBRIDEMENT Right 03/30/2022   Procedure: RIGHT GROIN IRRIGATION & DEBRIDEMENT;  Surgeon: Lanis Fonda BRAVO, MD;  Location: [facility] OR;  Service: Vascular;  Laterality: Right;   INSERTION OF ILIAC STENT Left 03/17/2022   Procedure: INSERTION OF ILIAC STENT;  Surgeon: Jama Cordella MATSU, MD;  Location: [facility] ORS;  Service: Vascular;  Laterality: Left;   LAPAROSCOPIC CHOLECYSTECTOMY Left 10/26/1999   Procedure: LAPAROSCOPIC CHOLECYSTECTOMY; Location: [facility]; Surgeon: Unknown Sharps, MD   LEFT HEART CATH AND CORONARY ANGIOGRAPHY N/A 11/18/2021   Procedure: LEFT HEART CATH AND CORONARY ANGIOGRAPHY;  Surgeon: Florencio Cara BIRCH, MD;  Location: [facility] INVASIVE CV LAB;  Service: Cardiovascular;  Laterality: N/A;   LEFT HEART CATH AND CORS/GRAFTS ANGIOGRAPHY Left 11/19/2002   Procedure: LEFT HEART CATH AND CORS/GRAFTS ANGIOGRAPHY; Location: [facility]; Surgeon: Margie Florencio, MD   LEFT HEART CATH AND CORS/GRAFTS ANGIOGRAPHY Left 09/17/2003   Procedure: LEFT HEART CATH AND CORS/GRAFTS ANGIOGRAPHY; Location: [facility]; Surgeon: Margie Florencio, MD   LOWER EXTREMITY ANGIOGRAPHY Right 01/06/2022   Procedure: Lower Extremity  Angiography;  Surgeon: Jama Cordella MATSU, MD;  Location: [facility] INVASIVE CV LAB;  Service: Cardiovascular;  Laterality: Right;   PERIPHERAL VASCULAR BALLOON ANGIOPLASTY  10/06/2022   Procedure: PERIPHERAL VASCULAR BALLOON ANGIOPLASTY;  Surgeon: Lanis Fonda BRAVO, MD;  Location: [facility] INVASIVE CV LAB;  Service: Cardiovascular;;  left iliac and bilateral renal   RENAL ARTERY ANGIOPLASTY Bilateral 12/2013   TOE AMPUTATION Right    small toe   TONSILLECTOMY AND ADENOIDECTOMY     TOTAL HIP ARTHROPLASTY Left 2005   TOTAL HIP ARTHROPLASTY Right 2015   UPPER EXTREMITY ANGIOGRAPHY Left 11/23/2021   Procedure: UPPER EXTREMITY ANGIOGRAPHY;  Surgeon: Marea Selinda RAMAN, MD;  Location: [facility] INVASIVE CV LAB;   Service: Cardiovascular;  Laterality: Left;    Family History  Problem Relation Age of Onset   Stroke Mother    Hypertension Mother    Diabetes Mother    Hypertension Father    Heart disease Sister        MI   Multiple sclerosis Daughter    Multiple sclerosis Son    Cerebral aneurysm Son    Seizures Son    Cerebral aneurysm Son    Breast cancer Paternal Aunt 70    SOCIAL HX:  reports that she quit smoking about 25 years ago. Her smoking use included cigarettes. She has been exposed to tobacco smoke. She has never used smokeless tobacco. She reports that she does not drink alcohol and does not use drugs.    Current Outpatient Medications:    aspirin  81 MG tablet, Take 81 mg by mouth in the morning., Disp: , Rfl:    betamethasone  dipropionate 0.05 % cream, Apply topically 2 (two) times daily. To affected area, Disp: 45 g, Rfl: 0   clopidogrel  (PLAVIX ) 75 MG tablet, Take 1 tablet (75 mg total) by mouth at bedtime. To prevent strokes, Disp: 90 tablet, Rfl: 1   DULoxetine  (CYMBALTA ) 20 MG capsule, Take 1 capsule (20 mg total) by mouth daily. For chronic pain, anxiety and depression, Disp: 30 capsule, Rfl: 0   ferrous sulfate  325 (65 FE) MG EC tablet, Take 1 tablet (325 mg total) by mouth daily., Disp: , Rfl:    hydrALAZINE  (APRESOLINE ) 50 MG tablet, TAKE 1 TABLET BY MOUTH THREE TIMES DAILY AS NEEDED FOR BLOOD PRESSURE GREATER THAN 150, Disp: 270 tablet, Rfl: 0   HYDROcodone -acetaminophen  (NORCO/VICODIN) 5-325 MG tablet, Take 1 tablet by mouth every 8 (eight) hours as needed for severe pain (pain score 7-10). Must last 30 days., Disp: 90 tablet, Rfl: 0   methocarbamol  (ROBAXIN ) 500 MG tablet, Take 1 tablet (500 mg total) by mouth every 8 (eight) hours as needed for muscle spasms., Disp: 30 tablet, Rfl: 0   mirabegron  ER (MYRBETRIQ ) 50 MG TB24 tablet, Take 1 tablet (50 mg total) by mouth daily., Disp: 30 tablet, Rfl: 11   nortriptyline  (PAMELOR ) 10 MG capsule, Take 1 capsule (10 mg total) by  mouth 2 (two) times daily. For chronic headaches, Disp: 180 capsule, Rfl: 3   vitamin C  (ASCORBIC ACID ) 250 MG tablet, Take 1 tablet (250 mg total) by mouth daily., Disp: 90 tablet, Rfl: 1   amLODipine  (NORVASC ) 10 MG tablet, Take 1 tablet (10 mg total) by mouth at bedtime., Disp: 90 tablet, Rfl: 0   ezetimibe  (ZETIA ) 10 MG tablet, Take 1 tablet (10 mg total) by mouth at bedtime. For cholesterol, Disp: 90 tablet, Rfl: 1   furosemide  (LASIX ) 20 MG tablet, Take 1 tablet (20 mg total) by mouth daily. For fluid  retention, Disp: 90 tablet, Rfl: 1   labetalol  (NORMODYNE ) 300 MG tablet, TAKE 1 TABLET BY MOUTH TWICE DAILY FOR HIGH BLOOD PRESSURE, Disp: 180 tablet, Rfl: 1   losartan  (COZAAR ) 50 MG tablet, Take 1 tablet (50 mg total) by mouth at bedtime. For blood pressure, Disp: 90 tablet, Rfl: 1   pantoprazole  (PROTONIX ) 40 MG tablet, One tablet upon waking,  take 30 minuts prior to eating, Disp: 90 tablet, Rfl: 1   ranolazine  (RANEXA ) 500 MG 12 hr tablet, Take 1 tablet (500 mg total) by mouth 2 (two) times daily. For chronic chest pain, Disp: 180 tablet, Rfl: 1  EXAM:  VITALS per patient if applicable:  GENERAL: alert, oriented, appears well and in no acute distress  HEENT: atraumatic, conjunttiva clear, no obvious abnormalities on inspection of external nose and ears  NECK: normal movements of the head and neck  LUNGS: on inspection no signs of respiratory distress, breathing rate appears normal, no obvious gross SOB, gasping or wheezing  CV: no obvious cyanosis  MS: moves all visible extremities without noticeable abnormality  PSYCH/NEURO: pleasant and cooperative, no obvious depression or anxiety, speech and thought processing grossly intact  ASSESSMENT AND PLAN: Stage 3b chronic kidney disease (HCC) Assessment & Plan: GFR has returned to baseline .  She  has been encouraged to  continue semi annual follow u p with nephrology   Lab Results  Component Value Date   CREATININE 1.80 (H)  06/05/2024      Essential hypertension -     amLODIPine  Besylate; Take 1 tablet (10 mg total) by mouth at bedtime.  Dispense: 90 tablet; Refill: 0 -     Labetalol  HCl; TAKE 1 TABLET BY MOUTH TWICE DAILY FOR HIGH BLOOD PRESSURE  Dispense: 180 tablet; Refill: 1  Lumbar facet arthropathy  Status post bilateral hip replacements  Chronic pain syndrome  Gastroesophageal reflux disease without esophagitis -     Pantoprazole  Sodium; One tablet upon waking,  take 30 minuts prior to eating  Dispense: 90 tablet; Refill: 1  Anemia of chronic kidney failure, stage 3 (moderate) (HCC) Assessment & Plan:  Iron stores are normal with  iron supplement prescribed by Dr Babara but not tolerating it.  Advised to contact Dr Babara for an alternative formulation   Lab Results  Component Value Date   WBC 5.9 06/05/2024   HGB 10.6 (L) 06/05/2024   HCT 32.3 (L) 06/05/2024   MCV 90.7 06/05/2024   PLT 266 06/05/2024   Lab Results  Component Value Date   IRON 62 06/05/2024   TIBC 328 06/05/2024   FERRITIN 186 06/05/2024      Urge incontinence of urine Assessment & Plan: Chronic,  with no prior evaluation .  Referrred to Urogyn for evaluation , now taking myrbetriq    Subclavian artery stenosis, left [facility]) Assessment & Plan: S/p stentng of left subclavian artery in 2023.  Continue plavix  and asa and afvised to resume 6 month follow up with  DR Lanis, h  [place] vascular surgeon    Peripheral vascular disease [facility]) Assessment & Plan: s/p right groin exploration, redo CFA patch plasty with ipsilateral GSV. In December 2023 she underwent right-sided EIA drug-coated balloon angioplasty, bilateral renal artery angioplasty for assisted primary patency of bilateral renal artery stents. Last imaging occurred 04/2023 due to concern for threatened stents. Imaging demonstrated widely patent iliac stents bilaterally.  Her last appt with Dr Lanis was NOv 2024.  Dopplers were done at that time.  She is overdue for 6  month follow up    Other orders -     Ezetimibe ; Take 1 tablet (10 mg total) by mouth at bedtime. For cholesterol  Dispense: 90 tablet; Refill: 1 -     Furosemide ; Take 1 tablet (20 mg total) by mouth daily. For fluid retention  Dispense: 90 tablet; Refill: 1 -     Losartan  Potassium; Take 1 tablet (50 mg total) by mouth at bedtime. For blood pressure  Dispense: 90 tablet; Refill: 1 -     Ranolazine  ER; Take 1 tablet (500 mg total) by mouth 2 (two) times daily. For chronic chest pain  Dispense: 180 tablet; Refill: 1      I discussed the assessment and treatment plan with the patient. The patient was provided an opportunity to ask questions and all were answered. The patient agreed with the plan and demonstrated an understanding of the instructions.   The patient was advised to call back or seek an in-person evaluation if the symptoms worsen or if the condition fails to improve as anticipated.   I spent 30 minutes dedicated to the care of this patient on the date of this encounter to include pre-visit review of patient's medical history,  including most recent OVs with nephrology, vascular surgery, cardiology and pain management.  Recent  imaging studies and labs, face-to-face time with the patient , and post visit ordering of testing and therapeutics.    Verneita LITTIE Kettering, MD

## 2024-06-06 LAB — KAPPA/LAMBDA LIGHT CHAINS
Kappa free light chain: 54.3 mg/L — ABNORMAL HIGH (ref 3.3–19.4)
Kappa, lambda light chain ratio: 2.29 — ABNORMAL HIGH (ref 0.26–1.65)
Lambda free light chains: 23.7 mg/L (ref 5.7–26.3)

## 2024-06-06 MED ORDER — FUROSEMIDE 20 MG PO TABS: 20.0000 mg | ORAL_TABLET | Freq: Every day | ORAL | 1 refills | Status: DC

## 2024-06-06 MED ORDER — PANTOPRAZOLE SODIUM 40 MG PO TBEC: DELAYED_RELEASE_TABLET | ORAL | 1 refills | Status: DC

## 2024-06-06 MED ORDER — EZETIMIBE 10 MG PO TABS: 10.0000 mg | ORAL_TABLET | Freq: Every day | ORAL | 1 refills | Status: DC

## 2024-06-06 MED ORDER — AMLODIPINE BESYLATE 10 MG PO TABS: 10.0000 mg | ORAL_TABLET | Freq: Every day | ORAL | 0 refills | Status: DC

## 2024-06-06 MED ORDER — LOSARTAN POTASSIUM 50 MG PO TABS: 50.0000 mg | ORAL_TABLET | Freq: Every day | ORAL | 1 refills | Status: AC

## 2024-06-06 MED ORDER — RANOLAZINE ER 500 MG PO TB12: 500.0000 mg | ORAL_TABLET | Freq: Two times a day (BID) | ORAL | 1 refills | Status: DC

## 2024-06-06 MED ORDER — LABETALOL HCL 300 MG PO TABS: ORAL_TABLET | ORAL | 1 refills | Status: DC

## 2024-06-06 NOTE — Assessment & Plan Note (Addendum)
 GFR has returned to baseline .  She  has been encouraged to  continue semi annual follow u p with nephrology   Lab Results  Component Value Date   CREATININE 1.80 (H) 06/05/2024

## 2024-06-06 NOTE — Assessment & Plan Note (Addendum)
 s/p right groin exploration, redo CFA patch plasty with ipsilateral GSV. In December 2023 she underwent right-sided EIA drug-coated balloon angioplasty, bilateral renal artery angioplasty for assisted primary patency of bilateral renal artery stents. Last imaging occurred 04/2023 due to concern for threatened stents. Imaging demonstrated widely patent iliac stents bilaterally.  Her last appt with Dr Lanis was NOv 2024.  Dopplers were done at that time.  She is overdue for 6 month follow up

## 2024-06-06 NOTE — Assessment & Plan Note (Addendum)
 S/p stentng of left subclavian artery in 2023.  Continue plavix  and asa and afvised to resume 6 month follow up with  DR Lanis, h  [place] vascular surgeon

## 2024-06-06 NOTE — Assessment & Plan Note (Signed)
 Chronic,  with no prior evaluation .  Referrred to Urogyn for evaluation , now taking myrbetriq 

## 2024-06-06 NOTE — Assessment & Plan Note (Addendum)
 Iron stores are normal with  iron supplement prescribed by Dr Babara but not tolerating it.  Advised to contact Dr Babara for an alternative formulation   Lab Results  Component Value Date   WBC 5.9 06/05/2024   HGB 10.6 (L) 06/05/2024   HCT 32.3 (L) 06/05/2024   MCV 90.7 06/05/2024   PLT 266 06/05/2024   Lab Results  Component Value Date   IRON 62 06/05/2024   TIBC 328 06/05/2024   FERRITIN 186 06/05/2024

## 2024-06-07 ENCOUNTER — Inpatient Hospital Stay (HOSPITAL_BASED_OUTPATIENT_CLINIC_OR_DEPARTMENT_OTHER): Payer: Medicare Other | Admitting: Oncology

## 2024-06-07 ENCOUNTER — Encounter: Payer: Self-pay | Admitting: Oncology

## 2024-06-07 ENCOUNTER — Encounter: Payer: Self-pay | Admitting: Vascular Surgery

## 2024-06-07 ENCOUNTER — Ambulatory Visit (HOSPITAL_BASED_OUTPATIENT_CLINIC_OR_DEPARTMENT_OTHER)
Admission: RE | Admit: 2024-06-07 | Discharge: 2024-06-07 | Disposition: A | Discharge status: Home / Self Care | Source: Ambulatory Visit | Attending: Vascular Surgery | Admitting: Vascular Surgery

## 2024-06-07 ENCOUNTER — Ambulatory Visit (HOSPITAL_COMMUNITY)
Admission: RE | Admit: 2024-06-07 | Discharge: 2024-06-07 | Disposition: A | Discharge status: Home / Self Care | Source: Ambulatory Visit | Attending: Vascular Surgery | Admitting: Vascular Surgery

## 2024-06-07 ENCOUNTER — Telehealth: Payer: Self-pay | Admitting: *Deleted

## 2024-06-07 ENCOUNTER — Ambulatory Visit (INDEPENDENT_AMBULATORY_CARE_PROVIDER_SITE_OTHER): Admitting: Vascular Surgery

## 2024-06-07 ENCOUNTER — Telehealth: Payer: Self-pay | Admitting: Urology

## 2024-06-07 VITALS — BP 159/63 | HR 73 | Temp 98.0°F | Resp 18 | Wt 160.3 lb

## 2024-06-07 VITALS — BP 177/75 | HR 67 | Temp 98.1°F | Resp 20 | Ht 60.0 in | Wt 159.5 lb

## 2024-06-07 DIAGNOSIS — D472 Monoclonal gammopathy: Secondary | ICD-10-CM | POA: Diagnosis not present

## 2024-06-07 DIAGNOSIS — I701 Atherosclerosis of renal artery: Secondary | ICD-10-CM

## 2024-06-07 DIAGNOSIS — D631 Anemia in chronic kidney disease: Secondary | ICD-10-CM | POA: Diagnosis not present

## 2024-06-07 DIAGNOSIS — I6523 Occlusion and stenosis of bilateral carotid arteries: Secondary | ICD-10-CM | POA: Insufficient documentation

## 2024-06-07 DIAGNOSIS — I6521 Occlusion and stenosis of right carotid artery: Secondary | ICD-10-CM | POA: Diagnosis not present

## 2024-06-07 DIAGNOSIS — I7143 Infrarenal abdominal aortic aneurysm, without rupture: Secondary | ICD-10-CM | POA: Diagnosis not present

## 2024-06-07 DIAGNOSIS — N1832 Chronic kidney disease, stage 3b: Secondary | ICD-10-CM | POA: Diagnosis not present

## 2024-06-07 DIAGNOSIS — I6522 Occlusion and stenosis of left carotid artery: Secondary | ICD-10-CM | POA: Diagnosis not present

## 2024-06-07 DIAGNOSIS — T82856A Stenosis of peripheral vascular stent, initial encounter: Secondary | ICD-10-CM | POA: Diagnosis not present

## 2024-06-07 DIAGNOSIS — Z95828 Presence of other vascular implants and grafts: Secondary | ICD-10-CM | POA: Diagnosis not present

## 2024-06-07 DIAGNOSIS — Z9889 Other specified postprocedural states: Secondary | ICD-10-CM | POA: Insufficient documentation

## 2024-06-07 DIAGNOSIS — I739 Peripheral vascular disease, unspecified: Secondary | ICD-10-CM

## 2024-06-07 DIAGNOSIS — N183 Chronic kidney disease, stage 3 unspecified: Secondary | ICD-10-CM

## 2024-06-07 DIAGNOSIS — I130 Hypertensive heart and chronic kidney disease with heart failure and stage 1 through stage 4 chronic kidney disease, or unspecified chronic kidney disease: Secondary | ICD-10-CM | POA: Diagnosis not present

## 2024-06-07 LAB — MULTIPLE MYELOMA PANEL, SERUM
Albumin SerPl Elph-Mcnc: 3.7 g/dL (ref 2.9–4.4)
Albumin/Glob SerPl: 1.1 (ref 0.7–1.7)
Alpha 1: 0.3 g/dL (ref 0.0–0.4)
Alpha2 Glob SerPl Elph-Mcnc: 1 g/dL (ref 0.4–1.0)
B-Globulin SerPl Elph-Mcnc: 1.1 g/dL (ref 0.7–1.3)
Gamma Glob SerPl Elph-Mcnc: 1.3 g/dL (ref 0.4–1.8)
Globulin, Total: 3.6 g/dL (ref 2.2–3.9)
IgA: 236 mg/dL (ref 64–422)
IgG (Immunoglobin G), Serum: 1249 mg/dL (ref 586–1602)
IgM (Immunoglobulin M), Srm: 16 mg/dL — ABNORMAL LOW (ref 26–217)
M Protein SerPl Elph-Mcnc: 0.5 g/dL — ABNORMAL HIGH
Total Protein ELP: 7.3 g/dL (ref 6.0–8.5)

## 2024-06-07 LAB — VAS US ABI WITH/WO TBI
Left ABI: 0.57
Right ABI: 0.57

## 2024-06-07 MED ORDER — FERROUS SULFATE ER 142 (45 FE) MG PO TBCR: 1.0000 | EXTENDED_RELEASE_TABLET | Freq: Every day | ORAL | 5 refills | Status: AC

## 2024-06-07 NOTE — Telephone Encounter (Signed)
 Attempted to call for surgery scheduling. LVM

## 2024-06-07 NOTE — Progress Notes (Signed)
 Hematology/Oncology Progress note [phone] [phone]      Patient Care Team: [name], MD as PCP - General (Internal Medicine) Babara Call, MD as Consulting Physician (Oncology)  ASSESSMENT & PLAN:   Anemia of chronic kidney failure, stage 3 (moderate) (HCC) Labs reviewed and discussed with patient Lab Results  Component Value Date   HGB 10.6 (L) 06/05/2024   TIBC 328 06/05/2024   IRONPCTSAT 19 06/05/2024   FERRITIN 186 06/05/2024    Hb is >10, no need for erythropoietin therapy She did not tolerate Vitron C well.  She is not interested in IV Venofer treatments. Recommend patient to try Slow Fe 1 tab daily.  CKD (chronic kidney disease), stage III (HCC) Avoid nephro toxins.  Encourage oral hydration Encourage patient to follow up with nephrologist.    MGUS (monoclonal gammopathy of unknown significance) I discussed with patient about the diagnosis of IgG kappa MGUS which is an asymptomatic condition which has a small risk of progression to smoldering multiple myeloma and to symptomatic multiple myeloma. Less frequently, these patients progress to AL amyloidosis, light chain deposition disease, or another lymphoproliferative disorder.  Lab Results  Component Value Date   MPROTEIN 0.5 (H) 06/05/2024   KPAFRELGTCHN 54.3 (H) 06/05/2024   LAMBDASER 23.7 06/05/2024   KAPLAMBRATIO 2.29 (H) 06/05/2024    No need for treatments.  Recommend observation. Check SPEP and light chain ratio every 6 months.     Orders Placed This Encounter  Procedures   CBC with Differential (Cancer Center Only)    Standing Status:   Future    Expected Date:   12/08/2024    Expiration Date:   03/08/2025   Ferritin    Standing Status:   Future    Expected Date:   12/08/2024    Expiration Date:   03/08/2025   Iron and TIBC    Standing Status:   Future    Expected Date:   12/08/2024    Expiration Date:   03/08/2025   Kappa/lambda light chains    Standing Status:    Future    Expected Date:   12/08/2024    Expiration Date:   03/08/2025   Multiple Myeloma Panel (SPEP&IFE w/QIG)    Standing Status:   Future    Expected Date:   12/08/2024    Expiration Date:   03/08/2025   Retic Panel    Standing Status:   Future    Expected Date:   12/08/2024    Expiration Date:   03/08/2025   Follow up 6 months  lab prior to MD  All questions were answered. The patient knows to call the clinic with any problems, questions or concerns.  Call Babara, MD, PhD [facility] Hematology Oncology 06/07/2024   REASON FOR VISIT Follow up for management of anemia   HISTORY OF PRESENTING ILLNESS:  Sabrina [name] is a  83 y.o.  female with PMH listed below who was referred to me for evaluation of anemia.  Patient recently had lab work done which revealed anemia with hemoglobin 11.5.   Reviewed patient's previous labs, her hemoglobin ranges from 10.8 to 11.8 in the past.  Reports feeling fatigue.  Patient denies weight loss, easy bruising, hematochezia, hemoptysis.  Has history of CKD and follows up with nephrologist.  Chronic pain syndrome. On Tramadol  and fentanyl  patch.      INTERVAL HISTORY Sabrina [name] is a 82 y.o. female who has above history reviewed by me today presents for follow up visit  for management of anemia.  She reports feeling well.  She takes oral iron supplementation She feels well today with no new complaints.    Review of Systems  Constitutional:  Positive for malaise/fatigue. Negative for chills, fever and weight loss.  HENT:  Negative for sore throat.   Eyes:  Negative for redness.  Respiratory:  Negative for cough, shortness of breath and wheezing.   Cardiovascular:  Negative for chest pain, palpitations and leg swelling.  Gastrointestinal:  Negative for abdominal pain, blood in stool, nausea and vomiting.  Genitourinary:  Negative for dysuria.  Musculoskeletal:  Positive for joint pain. Negative for myalgias.  Skin:  Negative for rash.   Neurological:  Negative for dizziness, tingling and tremors.  Endo/Heme/Allergies:  Does not bruise/bleed easily.  Psychiatric/Behavioral:  Negative for hallucinations.     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 b.) TTE 11/16/2021: EF >55%; mild AS (MPG 9 mmHg)   CAD (coronary artery disease)    a.) s/p 3v CABG 03/29/2000   Cardiac arrest [facility])    Carotid artery stenosis    a.) s/p LEFT CEA 09/09/2003. b.) Carotid doppler 88/78/7980: 1-39% LICA, CTO RICA; subclavian stenosis   Cataracts, bilateral    Cervical spondylosis without myelopathy    Chronic diastolic CHF (congestive heart failure), NYHA class 3 (HCC)    a.) TTE 05/27/2016: EF >55%, mild LA enlargement, triv PR, mild MR, mod TR; G3DD. b.) TTE 12/12/2017: EF >55%, mild LVH, BAE, mild MR/PR, mod TR; RVSP 52.8 mmHg. c.) TTE 03/18/2020: EF >55%, BAD, AS (MPG 8.7 mmHg); triv MR, mild TR/PR. d.) TTE 11/16/2021: EF >55%, LVH, G1DD, triv MR, mild PR, mod TR; AS (MPG 9 mmHg); MS (MPG 5 mmHg)   Chronic kidney disease, stage III (moderate) (HCC)    Chronic narcotic use 06/24/2014   Chronic pain syndrome    GERD (gastroesophageal reflux disease)    History of 2019 novel coronavirus disease (COVID-19) 09/22/2021   Hyperlipidemia    Hypertension    Long term current use of antithrombotics/antiplatelets    a.) on daily DAPT therapy (ASA + clopidogrel )   Lumbar stenosis with neurogenic claudication    Mitral stenosis 11/16/2021   a.) TTE 11/16/2021: EF >55%; mod MS (MPG 5 mmHg)   Osteoarthritis of hip     Postoperative wound infection 03/29/2022   Pulmonary hypertension (HCC) 12/12/2017   a.) TTE 12/12/2017: mild; RVSP 52.8 mmHg   PVD (peripheral vascular disease) (HCC)    Renal artery stenosis (HCC)    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    SURGICAL HISTORY: Past Surgical History:  Procedure Laterality Date   ABDOMINAL AORTOGRAM W/LOWER EXTREMITY N/A 10/06/2022   Procedure: ABDOMINAL AORTOGRAM W/LOWER EXTREMITY;  Surgeon: Lanis Fonda BRAVO, MD;  Location: [facility] INVASIVE CV LAB;  Service: Cardiovascular;  Laterality: N/A;  ABDOMINAL AORTOGRAM W/LOWER EXTREMITY N/A 05/04/2023   Procedure: ABDOMINAL AORTOGRAM W/LOWER EXTREMITY;  Surgeon: Lanis Fonda BRAVO, MD;  Location: [facility] INVASIVE CV LAB;  Service: Cardiovascular;  Laterality: N/A;   ABDOMINAL HYSTERECTOMY  1976   CAROTID ARTERY ANGIOPLASTY Left    CAROTID ENDARTERECTOMY Left 09/09/2003   Procedure: CAROTID ENDARTERECTOMY; Location: [facility]; Surgeon: Charlie Lango, MD   COLONOSCOPY WITH PROPOFOL  N/A 08/16/2017   Procedure: COLONOSCOPY WITH PROPOFOL ;  Surgeon: Jinny Carmine, MD;  Location: [facility] ENDOSCOPY;  Service: Endoscopy;  Laterality: N/A;   CORONARY ANGIOPLASTY WITH STENT PLACEMENT  2000   CORONARY ARTERY BYPASS GRAFT N/A 03/29/2000   Procedure: 3v CORONARY ARTERY BYPASS GRAFT; Location: [facility]   CYSTOSCOPY WITH STENT PLACEMENT Bilateral    ENDARTERECTOMY FEMORAL Bilateral 03/17/2022   Procedure: ENDARTERECTOMY FEMORAL ( BILATERAL SFA STENT);  Surgeon: Jama Cordella MATSU, MD;  Location: [facility] ORS;  Service: Vascular;  Laterality: Bilateral;   ENDARTERECTOMY FEMORAL Right 03/30/2022   Procedure: RE-EXPOSURE OF RIGHT COMMON FEMORAL ARTERY, RE-DO RIGHT FEMORAL ENDARTERECTOMY WITH VEIN PATCH;  Surgeon: Lanis Fonda BRAVO, MD;  Location:  [facility] OR;  Service: Vascular;  Laterality: Right;  WOUND VAC   ESOPHAGOGASTRODUODENOSCOPY (EGD) WITH PROPOFOL  N/A 08/16/2017   Procedure: ESOPHAGOGASTRODUODENOSCOPY (EGD) WITH PROPOFOL ;  Surgeon: Jinny Carmine, MD;  Location: [facility] ENDOSCOPY;  Service: Endoscopy;  Laterality: N/A;   ESOPHAGOGASTRODUODENOSCOPY (EGD) WITH PROPOFOL  N/A 06/29/2018   Procedure: ESOPHAGOGASTRODUODENOSCOPY (EGD) WITH PROPOFOL ;  Surgeon: Janalyn Keene NOVAK, MD;  Location: [facility] ENDOSCOPY;  Service: Endoscopy;  Laterality: N/A;   GROIN DEBRIDEMENT Right 03/30/2022   Procedure: RIGHT GROIN IRRIGATION & DEBRIDEMENT;  Surgeon: Lanis Fonda BRAVO, MD;  Location: [facility] OR;  Service: Vascular;  Laterality: Right;   INSERTION OF ILIAC STENT Left 03/17/2022   Procedure: INSERTION OF ILIAC STENT;  Surgeon: Jama Cordella MATSU, MD;  Location: [facility] ORS;  Service: Vascular;  Laterality: Left;   LAPAROSCOPIC CHOLECYSTECTOMY Left 10/26/1999   Procedure: LAPAROSCOPIC CHOLECYSTECTOMY; Location: [facility]; Surgeon: Unknown Sharps, MD   LEFT HEART CATH AND CORONARY ANGIOGRAPHY N/A 11/18/2021   Procedure: LEFT HEART CATH AND CORONARY ANGIOGRAPHY;  Surgeon: Florencio Cara BIRCH, MD;  Location: [facility] INVASIVE CV LAB;  Service: Cardiovascular;  Laterality: N/A;   LEFT HEART CATH AND CORS/GRAFTS ANGIOGRAPHY Left 11/19/2002   Procedure: LEFT HEART CATH AND CORS/GRAFTS ANGIOGRAPHY; Location: [facility]; Surgeon: Margie Florencio, MD   LEFT HEART CATH AND CORS/GRAFTS ANGIOGRAPHY Left 09/17/2003   Procedure: LEFT HEART CATH AND CORS/GRAFTS ANGIOGRAPHY; Location: [facility]; Surgeon: Margie Florencio, MD   LOWER EXTREMITY ANGIOGRAPHY Right 01/06/2022   Procedure: Lower Extremity Angiography;  Surgeon: Jama Cordella MATSU, MD;  Location: [facility] INVASIVE CV LAB;  Service: Cardiovascular;  Laterality: Right;   PERIPHERAL VASCULAR BALLOON ANGIOPLASTY  10/06/2022   Procedure: PERIPHERAL VASCULAR BALLOON ANGIOPLASTY;  Surgeon: Lanis Fonda BRAVO, MD;  Location: [facility] INVASIVE CV LAB;  Service:  Cardiovascular;;  left iliac and bilateral renal   RENAL ARTERY ANGIOPLASTY Bilateral 12/2013   TOE AMPUTATION Right    small toe   TONSILLECTOMY AND ADENOIDECTOMY     TOTAL HIP ARTHROPLASTY Left 2005   TOTAL HIP ARTHROPLASTY Right 2015   UPPER EXTREMITY ANGIOGRAPHY Left 11/23/2021   Procedure: UPPER EXTREMITY ANGIOGRAPHY;  Surgeon: Marea Selinda RAMAN, MD;  Location: [facility] INVASIVE CV LAB;  Service: Cardiovascular;  Laterality: Left;    SOCIAL HISTORY: Social History   Socioeconomic History   Marital status: Divorced    Spouse name: Not on file   Number of children: 3   Years of education: Not on file  Highest education level: Not on file  Occupational History   Occupation: retired  Tobacco Use   Smoking status: Former    Current packs/day: 0.00    Types: Cigarettes    Quit date: 10/25/1998    Years since quitting: 25.6    Passive exposure: Past   Smokeless tobacco: Never  Vaping Use   Vaping status: Never Used  Substance and Sexual Activity   Alcohol use: No    Alcohol/week: 0.0 standard drinks of alcohol   Drug use: Never   Sexual activity: Not Currently  Other Topics Concern   Not on file  Social History Narrative   Daughter Grayce ([place]); 1 in [employer]; 1 in [place]   Lives alone   Social Drivers of Health   Financial Resource Strain: Low Risk  (05/14/2024)   Overall Financial Resource Strain (CARDIA)    Difficulty of Paying Living Expenses: Not hard at all  Food Insecurity: No Food Insecurity (05/14/2024)   Hunger Vital Sign    Worried About Running Out of Food in the Last Year: Never true    Ran Out of Food in the Last Year: Never true  Transportation Needs: No Transportation Needs (05/14/2024)   PRAPARE - Administrator, Civil Service (Medical): No    Lack of Transportation (Non-Medical): No  Physical Activity: Inactive (05/14/2024)   Exercise Vital Sign    Days of Exercise per Week: 1 day    Minutes of Exercise per Session: 0 min  Stress: No  Stress Concern Present (05/14/2024)   [employer] of Occupational Health - Occupational Stress Questionnaire    Feeling of Stress: Not at all  Social Connections: Moderately Integrated (05/14/2024)   Social Connection and Isolation Panel    Frequency of Communication with Friends and Family: More than three times a week    Frequency of Social Gatherings with Friends and Family: More than three times a week    Attends Religious Services: More than 4 times per year    Active Member of [employer] or Organizations: Yes    Attends Engineer, structural: More than 4 times per year    Marital Status: Divorced  Intimate Partner Violence: Not At Risk (05/14/2024)   Humiliation, Afraid, Rape, and Kick questionnaire    Fear of Current or Ex-Partner: No    Emotionally Abused: No    Physically Abused: No    Sexually Abused: No    FAMILY HISTORY: Family History  Problem Relation Age of Onset   Stroke Mother    Hypertension Mother    Diabetes Mother    Hypertension Father    Heart disease Sister        MI   Multiple sclerosis Daughter    Multiple sclerosis Son    Cerebral aneurysm Son    Seizures Son    Cerebral aneurysm Son    Breast cancer Paternal Aunt 71    ALLERGIES:  is allergic to celexa  [citalopram ], dilaudid  [hydromorphone  hcl], effient [prasugrel], hydrochlorothiazide , liothyronine , nsaids, nubain [nalbuphine hcl], penicillins, and statins.  MEDICATIONS:  Current Outpatient Medications  Medication Sig Dispense Refill   amLODipine  (NORVASC ) 10 MG tablet Take 1 tablet (10 mg total) by mouth at bedtime. 90 tablet 0   aspirin  81 MG tablet Take 81 mg by mouth in the morning.     betamethasone  dipropionate 0.05 % cream Apply topically 2 (two) times daily. To affected area 45 g 0   clopidogrel  (PLAVIX ) 75 MG tablet Take 1 tablet (75 mg total)  by mouth at bedtime. To prevent strokes 90 tablet 1   DULoxetine  (CYMBALTA ) 20 MG capsule Take 1 capsule (20 mg total) by mouth daily.  For chronic pain, anxiety and depression 30 capsule 0   ezetimibe  (ZETIA ) 10 MG tablet Take 1 tablet (10 mg total) by mouth at bedtime. For cholesterol 90 tablet 1   ferrous sulfate  ER (SLOW FE) 142 (45 Fe) MG TBCR tablet Take 1 tablet (45 mg of iron total) by mouth daily. 30 tablet 5   furosemide  (LASIX ) 20 MG tablet Take 1 tablet (20 mg total) by mouth daily. For fluid retention 90 tablet 1   hydrALAZINE  (APRESOLINE ) 50 MG tablet TAKE 1 TABLET BY MOUTH THREE TIMES DAILY AS NEEDED FOR BLOOD PRESSURE GREATER THAN 150 270 tablet 0   HYDROcodone -acetaminophen  (NORCO/VICODIN) 5-325 MG tablet Take 1 tablet by mouth every 8 (eight) hours as needed for severe pain (pain score 7-10). Must last 30 days. 90 tablet 0   labetalol  (NORMODYNE ) 300 MG tablet TAKE 1 TABLET BY MOUTH TWICE DAILY FOR HIGH BLOOD PRESSURE 180 tablet 1   losartan  (COZAAR ) 50 MG tablet Take 1 tablet (50 mg total) by mouth at bedtime. For blood pressure 90 tablet 1   methocarbamol  (ROBAXIN ) 500 MG tablet Take 1 tablet (500 mg total) by mouth every 8 (eight) hours as needed for muscle spasms. 30 tablet 0   mirabegron  ER (MYRBETRIQ ) 50 MG TB24 tablet Take 1 tablet (50 mg total) by mouth daily. 30 tablet 11   nortriptyline  (PAMELOR ) 10 MG capsule Take 1 capsule (10 mg total) by mouth 2 (two) times daily. For chronic headaches 180 capsule 3   pantoprazole  (PROTONIX ) 40 MG tablet One tablet upon waking,  take 30 minuts prior to eating 90 tablet 1   ranolazine  (RANEXA ) 500 MG 12 hr tablet Take 1 tablet (500 mg total) by mouth 2 (two) times daily. For chronic chest pain 180 tablet 1   No current facility-administered medications for this visit.     PHYSICAL EXAMINATION: ECOG PERFORMANCE STATUS: 1 - Symptomatic but completely ambulatory Vitals:   06/07/24 1421 06/07/24 1426  BP: (!) 166/62 (!) 159/63  Pulse: 73   Resp: 18   Temp: 98 F (36.7 C)   SpO2: 100%    Filed Weights   06/07/24 1421  Weight: 160 lb 4.8 oz (72.7 kg)     Physical Exam Constitutional:      General: She is not in acute distress.    Appearance: She is well-developed.  Eyes:     General: No scleral icterus. Cardiovascular:     Rate and Rhythm: Normal rate and regular rhythm.     Heart sounds: Murmur heard.  Pulmonary:     Effort: Pulmonary effort is normal. No respiratory distress.     Breath sounds: No wheezing.  Abdominal:     General: Bowel sounds are normal. There is no distension.     Palpations: Abdomen is soft.  Musculoskeletal:        General: No deformity. Normal range of motion.     Cervical back: Normal range of motion and neck supple.  Lymphadenopathy:     Cervical: No cervical adenopathy.  Skin:    General: Skin is warm and dry.     Findings: No erythema or rash.  Neurological:     Mental Status: She is alert and oriented to person, place, and time. Mental status is at baseline.     Cranial Nerves: No cranial nerve deficit.  Psychiatric:  Mood and Affect: Mood normal.      LABORATORY DATA:  I have reviewed the data as listed    Latest Ref Rng & Units 06/05/2024   10:55 AM 11/10/2023    3:14 PM 10/28/2023    4:08 PM  CBC  WBC 4.0 - 10.5 K/uL 5.9  7.1  5.3   Hemoglobin 12.0 - 15.0 g/dL 89.3  89.6  89.2   Hematocrit 36.0 - 46.0 % 32.3  31.3  32.7   Platelets 150 - 400 K/uL 266  262  270       Latest Ref Rng & Units 06/05/2024   10:55 AM 11/10/2023    3:14 PM 08/25/2023   11:50 AM  CMP  Glucose 70 - 99 mg/dL 89  897  90   BUN 8 - 23 mg/dL 24  24  23    Creatinine 0.44 - 1.00 mg/dL 8.19  8.29  8.49   Sodium 135 - 145 mmol/L 136  135  140   Potassium 3.5 - 5.1 mmol/L 3.8  4.1  3.9   Chloride 98 - 111 mmol/L 104  101  106   CO2 22 - 32 mmol/L 23  24  25    Calcium  8.9 - 10.3 mg/dL 9.5  9.0  9.8   Total Protein 6.5 - 8.1 g/dL 7.5  6.9    Total Bilirubin 0.0 - 1.2 mg/dL 0.8  0.8    Alkaline Phos 38 - 126 U/L 86  73    AST 15 - 41 U/L 19  17    ALT 0 - 44 U/L 13  11

## 2024-06-07 NOTE — Assessment & Plan Note (Signed)
 I discussed with patient about the diagnosis of IgG kappa MGUS which is an asymptomatic condition which has a small risk of progression to smoldering multiple myeloma and to symptomatic multiple myeloma. Less frequently, these patients progress to AL amyloidosis, light chain deposition disease, or another lymphoproliferative disorder.  Lab Results  Component Value Date   MPROTEIN 0.5 (H) 06/05/2024   KPAFRELGTCHN 54.3 (H) 06/05/2024   LAMBDASER 23.7 06/05/2024   KAPLAMBRATIO 2.29 (H) 06/05/2024    No need for treatments.  Recommend observation. Check SPEP and light chain ratio every 6 months.

## 2024-06-07 NOTE — Assessment & Plan Note (Addendum)
 Labs reviewed and discussed with patient Lab Results  Component Value Date   HGB 10.6 (L) 06/05/2024   TIBC 328 06/05/2024   IRONPCTSAT 19 06/05/2024   FERRITIN 186 06/05/2024    Hb is >10, no need for erythropoietin therapy She did not tolerate Vitron C well.  She is not interested in IV Venofer treatments. Recommend patient to try Slow Fe 1 tab daily.

## 2024-06-07 NOTE — Assessment & Plan Note (Signed)
 Avoid nephro toxins.  Encourage oral hydration Encourage patient to follow up with nephrologist.

## 2024-06-07 NOTE — Telephone Encounter (Signed)
 Pt called asking to have her Myrbetriq  50 mg refilled by Dr MacDiarmid.  It looks like she has 11 refills at [facility] on [address].  Pt said pharmacy won't fill it.

## 2024-06-07 NOTE — Progress Notes (Signed)
 Office Note    HPI: Sabrina [name] is a 83 y.o. (Jan 14, 1941) female presenting in follow up s/p right groin exploration, redo CFA patch plasty with ipsilateral GSV on 03/30/22.  Prior surgeries were performed at [facility] and include: Right renal artery stenting 4mm / Left renal artery stenting (6mm) (2015) Left subclavian artery stenting 9mm (2023) Bilateral common iliac artery stenting Bilateral external iliac stenting Left common femoral, profunda femoris, and superficial femoral artery endarterectomies Right common femoral, profunda femoris, and superficial femoral artery endarterectomies Stent placement to left external iliac artery with 8 mm diameter by 4 cm length of life star stent  My interventions: 09/2022 balloon angioplasty of the right EIA, bilateral renal angioplasty  [id number] Angio for elevated velocities in the iliac artery stents.  Angiography demonstrated widely patent stents bilaterally.  On exam today, Sabrina [name] was doing well.  Stated she is working with physical therapy due to issues with her back.  She notes some right thigh pain and weakness with flexion.  No significant claudication, ischemic rest pain, tissue loss.  Creatinine 1.8 at last check, slightly worsened over the last 6 months.  She spent a month down in [place]  with her daughter, with plans to move permanently, but has yet to make this jump.    Denies food fear, weight loss.  Denies stroke, TIA, amaurosis symptoms. Denies abdominal pain, back pain.   The pt is  on a daily aspirin .   Other AC:  plavix  The pt is  on medication for hypertension.   The pt is not diabetic.  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 b.) TTE 11/16/2021: EF >55%; mild AS (MPG 9 mmHg)   CAD (coronary artery disease)    a.) s/p 3v CABG 03/29/2000   Cardiac arrest [facility])    Carotid artery stenosis    a.) s/p LEFT CEA 09/09/2003. b.) Carotid doppler 88/78/7980: 1-39% LICA, CTO RICA; subclavian stenosis   Cataracts, bilateral    Cervical spondylosis without myelopathy    Chronic diastolic CHF (congestive heart failure), NYHA class 3 (HCC)    a.) TTE 05/27/2016: EF >55%, mild LA enlargement, triv PR, mild MR, mod TR; G3DD. b.) TTE 12/12/2017: EF >55%, mild LVH, BAE, mild MR/PR, mod TR; RVSP 52.8 mmHg. c.) TTE 03/18/2020: EF >55%, BAD, AS (MPG 8.7 mmHg); triv MR, mild TR/PR. d.) TTE 11/16/2021: EF >55%, LVH, G1DD, triv MR, mild PR, mod TR; AS (MPG 9 mmHg); MS (MPG 5 mmHg)   Chronic kidney disease, stage III (moderate) (HCC)    Chronic narcotic use 06/24/2014   Chronic pain syndrome    GERD (gastroesophageal reflux disease)    History of 2019 novel coronavirus disease (COVID-19) 09/22/2021   Hyperlipidemia    Hypertension    Long term current use of antithrombotics/antiplatelets    a.) on daily DAPT therapy (ASA + clopidogrel )   Lumbar stenosis with neurogenic claudication    Mitral stenosis 11/16/2021   a.) TTE 11/16/2021: EF >55%; mod MS (MPG 5 mmHg)   Osteoarthritis of hip    Postoperative wound infection  03/29/2022   Pulmonary hypertension (HCC) 12/12/2017   a.) TTE 12/12/2017: mild; RVSP 52.8 mmHg   PVD (peripheral vascular disease) (HCC)    Renal artery stenosis (HCC)    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    Past Surgical History:  Procedure Laterality  Date   ABDOMINAL AORTOGRAM W/LOWER EXTREMITY N/A 10/06/2022   Procedure: ABDOMINAL AORTOGRAM W/LOWER EXTREMITY;  Surgeon: Lanis Fonda BRAVO, MD;  Location: [facility] INVASIVE CV LAB;  Service: Cardiovascular;  Laterality: N/A;   ABDOMINAL AORTOGRAM W/LOWER EXTREMITY N/A 05/04/2023   Procedure: ABDOMINAL AORTOGRAM W/LOWER EXTREMITY;  Surgeon: Lanis Fonda BRAVO, MD;  Location: [facility] INVASIVE CV LAB;  Service: Cardiovascular;  Laterality: N/A;   ABDOMINAL HYSTERECTOMY  1976   CAROTID ARTERY ANGIOPLASTY Left    CAROTID ENDARTERECTOMY Left 09/09/2003   Procedure: CAROTID ENDARTERECTOMY; Location: [facility]; Surgeon: Charlie Lango, MD   COLONOSCOPY WITH PROPOFOL  N/A 08/16/2017   Procedure: COLONOSCOPY WITH PROPOFOL ;  Surgeon: Jinny Carmine, MD;  Location: [facility] ENDOSCOPY;  Service: Endoscopy;  Laterality: N/A;   CORONARY ANGIOPLASTY WITH STENT PLACEMENT  2000   CORONARY ARTERY BYPASS GRAFT N/A 03/29/2000   Procedure: 3v CORONARY ARTERY BYPASS GRAFT; Location: [facility]   CYSTOSCOPY WITH STENT PLACEMENT Bilateral    ENDARTERECTOMY FEMORAL Bilateral 03/17/2022   Procedure: ENDARTERECTOMY FEMORAL ( BILATERAL SFA STENT);  Surgeon: Jama Cordella MATSU, MD;  Location: [facility] ORS;  Service: Vascular;  Laterality: Bilateral;   ENDARTERECTOMY FEMORAL Right 03/30/2022   Procedure: RE-EXPOSURE OF RIGHT COMMON FEMORAL ARTERY, RE-DO RIGHT FEMORAL ENDARTERECTOMY WITH VEIN PATCH;  Surgeon: Lanis Fonda BRAVO, MD;  Location: [facility] OR;  Service: Vascular;  Laterality: Right;  WOUND VAC   ESOPHAGOGASTRODUODENOSCOPY (EGD) WITH PROPOFOL  N/A 08/16/2017   Procedure: ESOPHAGOGASTRODUODENOSCOPY (EGD) WITH PROPOFOL ;  Surgeon: Jinny Carmine, MD;  Location: [facility] ENDOSCOPY;  Service: Endoscopy;  Laterality: N/A;   ESOPHAGOGASTRODUODENOSCOPY (EGD) WITH PROPOFOL  N/A 06/29/2018   Procedure: ESOPHAGOGASTRODUODENOSCOPY (EGD) WITH PROPOFOL ;  Surgeon: Janalyn Keene NOVAK, MD;  Location: [facility] ENDOSCOPY;  Service: Endoscopy;  Laterality: N/A;   GROIN DEBRIDEMENT Right  03/30/2022   Procedure: RIGHT GROIN IRRIGATION & DEBRIDEMENT;  Surgeon: Lanis Fonda BRAVO, MD;  Location: [facility] OR;  Service: Vascular;  Laterality: Right;   INSERTION OF ILIAC STENT Left 03/17/2022   Procedure: INSERTION OF ILIAC STENT;  Surgeon: Jama Cordella MATSU, MD;  Location: [facility] ORS;  Service: Vascular;  Laterality: Left;   LAPAROSCOPIC CHOLECYSTECTOMY Left 10/26/1999   Procedure: LAPAROSCOPIC CHOLECYSTECTOMY; Location: [facility]; Surgeon: Unknown Sharps, MD   LEFT HEART CATH AND CORONARY ANGIOGRAPHY N/A 11/18/2021   Procedure: LEFT HEART CATH AND CORONARY ANGIOGRAPHY;  Surgeon: Florencio Cara BIRCH, MD;  Location: [facility] INVASIVE CV LAB;  Service: Cardiovascular;  Laterality: N/A;   LEFT HEART CATH AND CORS/GRAFTS ANGIOGRAPHY Left 11/19/2002   Procedure: LEFT HEART CATH AND CORS/GRAFTS ANGIOGRAPHY; Location: [facility]; Surgeon: Margie Florencio, MD   LEFT HEART CATH AND CORS/GRAFTS ANGIOGRAPHY Left 09/17/2003   Procedure: LEFT HEART CATH AND CORS/GRAFTS ANGIOGRAPHY; Location: [facility]; Surgeon: Margie Florencio, MD   LOWER EXTREMITY ANGIOGRAPHY Right 01/06/2022   Procedure: Lower Extremity Angiography;  Surgeon: Jama Cordella MATSU, MD;  Location[facility] INVASIVE CV LAB;  Service: Cardiovascular;  Laterality: Right;   PERIPHERAL VASCULAR BALLOON ANGIOPLASTY  10/06/2022   Procedure: PERIPHERAL VASCULAR BALLOON ANGIOPLASTY;  Surgeon: Lanis,  Fonda BRAVO, MD;  Location: [facility] INVASIVE CV LAB;  Service: Cardiovascular;;  left iliac and bilateral renal   RENAL ARTERY ANGIOPLASTY Bilateral 12/2013   TOE AMPUTATION Right    small toe   TONSILLECTOMY AND ADENOIDECTOMY     TOTAL HIP ARTHROPLASTY Left 2005   TOTAL HIP ARTHROPLASTY Right 2015   UPPER EXTREMITY ANGIOGRAPHY Left 11/23/2021   Procedure: UPPER EXTREMITY ANGIOGRAPHY;  Surgeon: Marea Selinda RAMAN, MD;  Location: [facility] INVASIVE CV LAB;  Service: Cardiovascular;  Laterality: Left;    Social History   Socioeconomic History   Marital status: Divorced    Spouse name: Not on  file   Number of children: 3   Years of education: Not on file   Highest education level: Not on file  Occupational History   Occupation: retired  Tobacco Use   Smoking status: Former    Current packs/day: 0.00    Types: Cigarettes    Quit date: 10/25/1998    Years since quitting: 25.6    Passive exposure: Past   Smokeless tobacco: Never  Vaping Use   Vaping status: Never Used  Substance and Sexual Activity   Alcohol use: No    Alcohol/week: 0.0 standard drinks of alcohol   Drug use: Never   Sexual activity: Not Currently  Other Topics Concern   Not on file  Social History Narrative   Daughter Sabrina [name] ([place]); 1 in [employer]; 1 in [place]   Lives alone   Social Drivers of Health   Financial Resource Strain: Low Risk  (05/14/2024)   Overall Financial Resource Strain (CARDIA)    Difficulty of Paying Living Expenses: Not hard at all  Food Insecurity: No Food Insecurity (05/14/2024)   Hunger Vital Sign    Worried About Running Out of Food in the Last Year: Never true    Ran Out of Food in the Last Year: Never true  Transportation Needs: No Transportation Needs (05/14/2024)   PRAPARE - Administrator, Civil Service (Medical): No    Lack of Transportation (Non-Medical): No  Physical Activity: Inactive (05/14/2024)   Exercise Vital Sign    Days of Exercise per Week: 1 day    Minutes of Exercise per Session: 0 min  Stress: No Stress Concern Present (05/14/2024)   [employer] of Occupational Health - Occupational Stress Questionnaire    Feeling of Stress: Not at all  Social Connections: Moderately Integrated (05/14/2024)   Social Connection and Isolation Panel    Frequency of Communication with Friends and Family: More than three times a week    Frequency of Social Gatherings with Friends and Family: More than three times a week    Attends Religious Services: More than 4 times per year    Active Member of [employer] or Organizations: Yes    Attends Tax inspector Meetings: More than 4 times per year    Marital Status: Divorced  Intimate Partner Violence: Not At Risk (05/14/2024)   Humiliation, Afraid, Rape, and Kick questionnaire    Fear of Current or Ex-Partner: No    Emotionally Abused: No    Physically Abused: No    Sexually Abused: No    Family History  Problem Relation Age of Onset   Stroke Mother    Hypertension Mother    Diabetes Mother    Hypertension Father    Heart disease Sister        MI   Multiple sclerosis Daughter    Multiple sclerosis Son    Cerebral  aneurysm Son    Seizures Son    Cerebral aneurysm Son    Breast cancer Paternal Aunt 55    Current Outpatient Medications  Medication Sig Dispense Refill   amLODipine  (NORVASC ) 10 MG tablet Take 1 tablet (10 mg total) by mouth at bedtime. 90 tablet 0   aspirin  81 MG tablet Take 81 mg by mouth in the morning.     betamethasone  dipropionate 0.05 % cream Apply topically 2 (two) times daily. To affected area 45 g 0   clopidogrel  (PLAVIX ) 75 MG tablet Take 1 tablet (75 mg total) by mouth at bedtime. To prevent strokes 90 tablet 1   DULoxetine  (CYMBALTA ) 20 MG capsule Take 1 capsule (20 mg total) by mouth daily. For chronic pain, anxiety and depression 30 capsule 0   ezetimibe  (ZETIA ) 10 MG tablet Take 1 tablet (10 mg total) by mouth at bedtime. For cholesterol 90 tablet 1   ferrous sulfate  325 (65 FE) MG EC tablet Take 1 tablet (325 mg total) by mouth daily.     furosemide  (LASIX ) 20 MG tablet Take 1 tablet (20 mg total) by mouth daily. For fluid retention 90 tablet 1   hydrALAZINE  (APRESOLINE ) 50 MG tablet TAKE 1 TABLET BY MOUTH THREE TIMES DAILY AS NEEDED FOR BLOOD PRESSURE GREATER THAN 150 270 tablet 0   HYDROcodone -acetaminophen  (NORCO/VICODIN) 5-325 MG tablet Take 1 tablet by mouth every 8 (eight) hours as needed for severe pain (pain score 7-10). Must last 30 days. 90 tablet 0   labetalol  (NORMODYNE ) 300 MG tablet TAKE 1 TABLET BY MOUTH TWICE DAILY FOR HIGH BLOOD  PRESSURE 180 tablet 1   losartan  (COZAAR ) 50 MG tablet Take 1 tablet (50 mg total) by mouth at bedtime. For blood pressure 90 tablet 1   methocarbamol  (ROBAXIN ) 500 MG tablet Take 1 tablet (500 mg total) by mouth every 8 (eight) hours as needed for muscle spasms. 30 tablet 0   mirabegron  ER (MYRBETRIQ ) 50 MG TB24 tablet Take 1 tablet (50 mg total) by mouth daily. 30 tablet 11   nortriptyline  (PAMELOR ) 10 MG capsule Take 1 capsule (10 mg total) by mouth 2 (two) times daily. For chronic headaches 180 capsule 3   pantoprazole  (PROTONIX ) 40 MG tablet One tablet upon waking,  take 30 minuts prior to eating 90 tablet 1   ranolazine  (RANEXA ) 500 MG 12 hr tablet Take 1 tablet (500 mg total) by mouth 2 (two) times daily. For chronic chest pain 180 tablet 1   vitamin C  (ASCORBIC ACID ) 250 MG tablet Take 1 tablet (250 mg total) by mouth daily. 90 tablet 1   No current facility-administered medications for this visit.    Allergies  Allergen Reactions   Celexa  [Citalopram ] Anaphylaxis    Throat closing    Dilaudid  [Hydromorphone  Hcl] Nausea And Vomiting   Effient [Prasugrel] Itching   Hydrochlorothiazide  Other (See Comments)    Decreased GFR (Nov 2015)   Liothyronine      Hair fell out, caused headaches    Nsaids     CKD stage III - avoid nephrotoxic drugs   Nubain [Nalbuphine Hcl]     Burning sensation in back   Penicillins Itching   Statins Itching     REVIEW OF SYSTEMS:   [X]  denotes positive finding, [ ]  denotes negative finding Cardiac  Comments:  Chest pain or chest pressure:    Shortness of breath upon exertion:    Short of breath when lying flat:    Irregular heart rhythm:  Vascular    Pain in calf, thigh, or hip brought on by ambulation:    Pain in feet at night that wakes you up from your sleep:     Blood clot in your veins:    Leg swelling:         Pulmonary    Oxygen at home:    Productive cough:     Wheezing:         Neurologic    Sudden weakness in arms or  legs:     Sudden numbness in arms or legs:     Sudden onset of difficulty speaking or slurred speech:    Temporary loss of vision in one eye:     Problems with dizziness:         Gastrointestinal    Blood in stool:     Vomited blood:         Genitourinary    Burning when urinating:     Blood in urine:        Psychiatric    Major depression:         Hematologic    Bleeding problems:    Problems with blood clotting too easily:        Skin    Rashes or ulcers:        Constitutional    Fever or chills:      PHYSICAL EXAMINATION:  Vitals:   06/07/24 1135  BP: (!) 177/75  Pulse: 67  Resp: 20  Temp: 98.1 F (36.7 C)  TempSrc: Temporal  SpO2: 99%  Weight: 159 lb 8 oz (72.3 kg)  Height: 5' (1.524 m)     General:  WDWN in NAD; vital signs documented above Gait: Not observed HENT: WNL, normocephalic Pulmonary: normal non-labored breathing , without wheezing Cardiac: regular HR, Abdomen: soft, NT, no masses Skin: without rashes Vascular Exam/Pulses:  Right Left  Radial 2+ (normal) 2+ (normal)  Ulnar 2+ (normal) 2+ (normal)  Femoral    Popliteal    DP nonpalpable nonpalpable  PT nonpalpable nonpalpable   Extremities: without ischemic changes, without Gangrene , without cellulitis; without open wounds;  Right groin wound with 3 small areas opened, probe less than 1 cm, Musculoskeletal: no muscle wasting or atrophy  Neurologic: A&O X 3;  No focal weakness or paresthesias are detected Psychiatric:  The pt has Normal affect.   Non-Invasive Vascular Imaging:    +------------------+--------+--------+-------+  Right Renal ArteryPSV cm/sEDV cm/sComment  +------------------+--------+--------+-------+  Origin             203      22            +------------------+--------+--------+-------+  Proximal           184      19            +------------------+--------+--------+-------+  Mid                131      13             +------------------+--------+--------+-------+  Distal             129      9             +------------------+--------+--------+-------+   +-----------------+--------+--------+-------+  Left Renal ArteryPSV cm/sEDV cm/sComment  +-----------------+--------+--------+-------+  Origin            374      38            +-----------------+--------+--------+-------+  Proximal          240      13            +-----------------+--------+--------+-------+  Mid               122      17            +-----------------+--------+--------+-------+  Distal             84      18            +-----------------+--------+--------+-------+    Abdominal Aorta Findings:  +-------------+-------+----------+---------+-----------+--------+----------  ----+  Location    AP (cm)Trans (cm)PSV      Waveform   ThrombusComments                                       (cm/s)                                       +-------------+-------+----------+---------+-----------+--------+----------  ----+  Proximal    2.40   2.40      73                                           +-------------+-------+----------+---------+-----------+--------+----------  ----+  Distal      3.40   3.40      130                         fusiform         +-------------+-------+----------+---------+-----------+--------+----------  ----+  RT CIA Prox                   198      biphasic                            +-------------+-------+----------+---------+-----------+--------+----------  ----+  RT CIA Mid                    200      biphasic                            +-------------+-------+----------+---------+-----------+--------+----------  ----+  RT CIA Distal                 318      biphasic                            +-------------+-------+----------+---------+-----------+--------+----------  ----+  RT EIA Prox                   269       biphasic                            +-------------+-------+----------+---------+-----------+--------+----------  ----+  RT EIA Mid                    299      multiphasic                         +-------------+-------+----------+---------+-----------+--------+----------  ----+  RT EIA Distal                 305      multiphasic                         +-------------+-------+----------+---------+-----------+--------+----------  ----+  LT CIA Prox                                               not  visualized  +-------------+-------+----------+---------+-----------+--------+----------  ----+  LT CIA Mid                                                not  visualized  +-------------+-------+----------+---------+-----------+--------+----------  ----+  LT CIA Distal                 226      biphasic                            +-------------+-------+----------+---------+-----------+--------+----------  ----+     Left Stent(s):  +--------------------+--------+---------------+-----------+----------------  ----+  External Iliac      PSV cm/sStenosis       Waveform   Comments               Artery                                                                       +--------------------+--------+---------------+-----------+----------------  ----+  Prox to Stent       226                    biphasic   distal common  iliac                                                         artery                 +--------------------+--------+---------------+-----------+----------------  ----+  Proximal Stent      293                    multiphasic                       +--------------------+--------+---------------+-----------+----------------  ----+  Mid Stent           312     50-99% stenosisbiphasic                          +--------------------+--------+---------------+-----------+----------------  ----+   Distal Stent        252                    biphasic                          +--------------------+--------+---------------+-----------+----------------  ----+  Distal to Stent     209                    multiphasic                       +--------------------+--------+---------------+-----------+----------------  ----+   8/14 +-------+-----------+-----------+------------+------------+  ABI/TBIToday's ABIToday's TBIPrevious ABIPrevious TBI  +-------+-----------+-----------+------------+------------+  Right .57        .39        .53         .42           +-------+-----------+-----------+------------+------------+  Left  .57        .48        .65         .43           +-------+-----------+-----------+------------+------------+    Left Carotid Findings:  +----------+--------+--------+--------+------------------+--------+           PSV cm/sEDV cm/sStenosisPlaque DescriptionComments  +----------+--------+--------+--------+------------------+--------+  CCA Prox  158     20                                          +----------+--------+--------+--------+------------------+--------+  CCA Distal88      19                                          +----------+--------+--------+--------+------------------+--------+  ICA Prox  58      8               heterogenous                +----------+--------+--------+--------+------------------+--------+  ICA Mid   83      24      1-39%   heterogenous                +----------+--------+--------+--------+------------------+--------+  ICA Distal72      24                                          +----------+--------+--------+--------+------------------+--------+  ECA      77      7                                           +----------+--------+--------+--------+------------------+--------+   +----------+--------+--------+-----------------------------+---------------  ----+            PSV cm/sEDV cm/sDescribe                     Arm Pressure  (mmHG)  +----------+--------+--------+-----------------------------+---------------  ----+  [id number]    0       Stenotic and Turbulent,      163                                             proximal stent not visualized                      +----------+--------+--------+-----------------------------+---------------  ----+   +---------+--------+--+--------+-+---------+  VertebralPSV cm/s35EDV cm/s6Antegrade  +---------+--------+--+--------+-+---------+    ASSESSMENT/PLAN: Sabrina [name] is a 83 y.o. female presenting in follow up s/p right groin exploration, redo CFA patch plasty with ipsilateral GSV.  In December 2023 she underwent right-sided EIA drug-coated balloon angioplasty, bilateral renal artery angioplasty for assisted primary patency of bilateral renal artery stents.  Last imaging occurred 04/2023 due to concern for threatened stents.  Imaging demonstrated widely patent iliac stents bilaterally.  Imaging studies were reviewed.  Iliac velocities are elevated, but relatively stable -similar to the velocities at the time of her last angiogram.  ABIs stable, carotid disease stable.  I am most concerned about her renal arteries and worsening kidney function.  Renal artery duplex ultrasound demonstrates critical stenosis at the left renal artery origin.  I am worried that the stent has in-stent restenosis, and is threatened.  This was plastied with a drug-coated balloon 2 years ago for a similar reason.  I had a long conversation with Brittane regarding the above.  I think she would benefit from renal artery angiogram with possible intervention in an effort to define the level of stenosis and treat to prevent thrombosis of the previously placed stents.   Fonda FORBES Rim, MD [facility] [phone]  Total time of patient care including pre-visit research, consultation,  and documentation greater than 40 minutes

## 2024-06-08 ENCOUNTER — Other Ambulatory Visit: Payer: Self-pay

## 2024-06-08 DIAGNOSIS — I701 Atherosclerosis of renal artery: Secondary | ICD-10-CM

## 2024-06-08 DIAGNOSIS — M48061 Spinal stenosis, lumbar region without neurogenic claudication: Secondary | ICD-10-CM | POA: Diagnosis not present

## 2024-06-08 NOTE — Telephone Encounter (Signed)
 This encounter was created in error - please disregard.

## 2024-06-12 DIAGNOSIS — M48061 Spinal stenosis, lumbar region without neurogenic claudication: Secondary | ICD-10-CM | POA: Diagnosis not present

## 2024-06-14 DIAGNOSIS — M48061 Spinal stenosis, lumbar region without neurogenic claudication: Secondary | ICD-10-CM | POA: Diagnosis not present

## 2024-06-14 NOTE — Telephone Encounter (Signed)
 Pharmacy states pt has 3 more refills for myrbetriq , LVM informing pt.

## 2024-06-19 DIAGNOSIS — M48061 Spinal stenosis, lumbar region without neurogenic claudication: Secondary | ICD-10-CM | POA: Diagnosis not present

## 2024-06-20 ENCOUNTER — Other Ambulatory Visit: Payer: Self-pay

## 2024-06-20 ENCOUNTER — Ambulatory Visit (HOSPITAL_COMMUNITY)
Admission: RE | Admit: 2024-06-20 | Discharge: 2024-06-20 | Disposition: A | Discharge status: Home / Self Care | Attending: Vascular Surgery | Admitting: Vascular Surgery

## 2024-06-20 ENCOUNTER — Encounter (HOSPITAL_COMMUNITY)
Admission: RE | Disposition: A | Discharge status: Home / Self Care | Payer: Self-pay | Source: Home / Self Care | Attending: Vascular Surgery

## 2024-06-20 DIAGNOSIS — Z9889 Other specified postprocedural states: Secondary | ICD-10-CM

## 2024-06-20 DIAGNOSIS — I5032 Chronic diastolic (congestive) heart failure: Secondary | ICD-10-CM | POA: Diagnosis not present

## 2024-06-20 DIAGNOSIS — I701 Atherosclerosis of renal artery: Secondary | ICD-10-CM

## 2024-06-20 DIAGNOSIS — N183 Chronic kidney disease, stage 3 unspecified: Secondary | ICD-10-CM | POA: Insufficient documentation

## 2024-06-20 DIAGNOSIS — I130 Hypertensive heart and chronic kidney disease with heart failure and stage 1 through stage 4 chronic kidney disease, or unspecified chronic kidney disease: Secondary | ICD-10-CM | POA: Insufficient documentation

## 2024-06-20 DIAGNOSIS — T82856A Stenosis of peripheral vascular stent, initial encounter: Secondary | ICD-10-CM | POA: Diagnosis not present

## 2024-06-20 DIAGNOSIS — Z87891 Personal history of nicotine dependence: Secondary | ICD-10-CM | POA: Diagnosis not present

## 2024-06-20 DIAGNOSIS — T82858A Stenosis of vascular prosthetic devices, implants and grafts, initial encounter: Secondary | ICD-10-CM

## 2024-06-20 HISTORY — PX: ABDOMINAL AORTOGRAM W/LOWER EXTREMITY: CATH118223

## 2024-06-20 HISTORY — PX: RENAL INTERVENTION: CATH118261

## 2024-06-20 LAB — POCT I-STAT, CHEM 8
BUN: 18 mg/dL (ref 8–23)
Calcium, Ion: 1.1 mmol/L — ABNORMAL LOW (ref 1.15–1.40)
Chloride: 107 mmol/L (ref 98–111)
Creatinine, Ser: 1.5 mg/dL — ABNORMAL HIGH (ref 0.44–1.00)
Glucose, Bld: 98 mg/dL (ref 70–99)
HCT: 29 % — ABNORMAL LOW (ref 36.0–46.0)
Hemoglobin: 9.9 g/dL — ABNORMAL LOW (ref 12.0–15.0)
Potassium: 3.6 mmol/L (ref 3.5–5.1)
Sodium: 139 mmol/L (ref 135–145)
TCO2: 21 mmol/L — ABNORMAL LOW (ref 22–32)

## 2024-06-20 SURGERY — ABDOMINAL AORTOGRAM W/LOWER EXTREMITY
Anesthesia: LOCAL

## 2024-06-20 MED ORDER — ASPIRIN 81 MG PO CHEW
CHEWABLE_TABLET | ORAL | Status: AC
  Filled 2024-06-20: qty 1

## 2024-06-20 MED ORDER — SODIUM CHLORIDE 0.9% FLUSH: 3.0000 mL | INTRAVENOUS | Status: DC | PRN

## 2024-06-20 MED ORDER — FENTANYL CITRATE (PF) 100 MCG/2ML IJ SOLN
INTRAMUSCULAR | Status: AC
  Filled 2024-06-20: qty 2

## 2024-06-20 MED ORDER — LIDOCAINE HCL (PF) 1 % IJ SOLN
INTRAMUSCULAR | Status: AC
  Filled 2024-06-20: qty 30

## 2024-06-20 MED ORDER — HEPARIN SODIUM (PORCINE) 1000 UNIT/ML IJ SOLN
INTRAMUSCULAR | Status: DC | PRN
Start: 2024-06-20 — End: 2024-06-20
  Administered 2024-06-20: 7000 [IU] via INTRAVENOUS

## 2024-06-20 MED ORDER — SODIUM CHLORIDE 0.9 % IV SOLN: 250.0000 mL | INTRAVENOUS | Status: DC | PRN

## 2024-06-20 MED ORDER — SODIUM CHLORIDE 0.9% FLUSH: 3.0000 mL | Freq: Two times a day (BID) | INTRAVENOUS | Status: DC

## 2024-06-20 MED ORDER — FENTANYL CITRATE (PF) 100 MCG/2ML IJ SOLN
INTRAMUSCULAR | Status: DC | PRN
  Administered 2024-06-20: 25 ug via INTRAVENOUS
  Administered 2024-06-20 (×2): 50 ug via INTRAVENOUS

## 2024-06-20 MED ORDER — CLOPIDOGREL BISULFATE 75 MG PO TABS
ORAL_TABLET | ORAL | Status: AC
  Filled 2024-06-20: qty 1

## 2024-06-20 MED ORDER — LABETALOL HCL 5 MG/ML IV SOLN: 10.0000 mg | INTRAVENOUS | Status: DC | PRN

## 2024-06-20 MED ORDER — ONDANSETRON HCL 4 MG/2ML IJ SOLN: 4.0000 mg | Freq: Four times a day (QID) | INTRAMUSCULAR | Status: DC | PRN

## 2024-06-20 MED ORDER — HEPARIN SODIUM (PORCINE) 1000 UNIT/ML IJ SOLN
INTRAMUSCULAR | Status: AC
  Filled 2024-06-20: qty 10

## 2024-06-20 MED ORDER — MIDAZOLAM HCL 2 MG/2ML IJ SOLN
INTRAMUSCULAR | Status: DC | PRN
  Administered 2024-06-20: 1 mg via INTRAVENOUS
  Administered 2024-06-20 (×2): .5 mg via INTRAVENOUS

## 2024-06-20 MED ORDER — LIDOCAINE HCL (PF) 1 % IJ SOLN
INTRAMUSCULAR | Status: DC | PRN
  Administered 2024-06-20: 10 mL via INTRADERMAL

## 2024-06-20 MED ORDER — MIDAZOLAM HCL 2 MG/2ML IJ SOLN
INTRAMUSCULAR | Status: AC
  Filled 2024-06-20: qty 2

## 2024-06-20 MED ORDER — IODIXANOL 320 MG/ML IV SOLN
INTRAVENOUS | Status: DC | PRN
Start: 2024-06-20 — End: 2024-06-20
  Administered 2024-06-20: 75 mL

## 2024-06-20 MED ORDER — CLOPIDOGREL BISULFATE 300 MG PO TABS
ORAL_TABLET | ORAL | Status: DC | PRN
  Administered 2024-06-20: 75 mg via ORAL

## 2024-06-20 MED ORDER — ASPIRIN 81 MG PO CHEW
CHEWABLE_TABLET | ORAL | Status: DC | PRN
  Administered 2024-06-20: 81 mg via ORAL

## 2024-06-20 MED ORDER — ACETAMINOPHEN 325 MG PO TABS
650.0000 mg | ORAL_TABLET | ORAL | Status: DC | PRN
Start: 2024-06-20 — End: 2024-06-20

## 2024-06-20 MED ORDER — SODIUM CHLORIDE 0.9 % WEIGHT BASED INFUSION: 1.0000 mL/kg/h | INTRAVENOUS | Status: DC

## 2024-06-20 MED ORDER — HEPARIN (PORCINE) IN NACL 2000-0.9 UNIT/L-% IV SOLN
INTRAVENOUS | Status: DC | PRN
  Administered 2024-06-20: 1000 mL

## 2024-06-20 MED ORDER — HYDRALAZINE HCL 20 MG/ML IJ SOLN: 5.0000 mg | INTRAMUSCULAR | Status: DC | PRN

## 2024-06-20 SURGICAL SUPPLY — 16 items
CATH OMNI FLUSH 5F 65CM (CATHETERS) IMPLANT
COVER DOME SNAP 22 D (MISCELLANEOUS) IMPLANT
DCB RANGER 4.0X40 135 (BALLOONS) IMPLANT
DCB RANGER 5.0X40 135 (BALLOONS) IMPLANT
DEVICE CLOSURE MYNXGRIP 6/7F (Vascular Products) IMPLANT
GUIDE CATH VISTA IMA 6F (CATHETERS) IMPLANT
KIT ENCORE 26 ADVANTAGE (KITS) IMPLANT
KIT MICROPUNCTURE NIT STIFF (SHEATH) IMPLANT
KIT SINGLE USE MANIFOLD (KITS) IMPLANT
PACK CARDIAC CATHETERIZATION (CUSTOM PROCEDURE TRAY) IMPLANT
SET ATX-X65L (MISCELLANEOUS) IMPLANT
SHEATH PINNACLE 5F 10CM (SHEATH) IMPLANT
SHEATH PINNACLE 6F 10CM (SHEATH) IMPLANT
SHEATH PROBE COVER 6X72 (BAG) IMPLANT
WIRE BENTSON .035X145CM (WIRE) IMPLANT
WIRE HI TORQ COMMND ES.014X300 (WIRE) IMPLANT

## 2024-06-20 NOTE — Progress Notes (Signed)
 PT ambulated to the bathroom with nurse, was able to void without difficulty. Incision site remain clean dry and intact. PT escorted from the unit via wheelchair to personal vehicle driven by Doma

## 2024-06-20 NOTE — H&P (Signed)
 Office Note    Patient seen and examined in preop holding.  No complaints. No changes to medication history or physical exam since last seen in clinic. After discussing the risks and benefits of renal angiography, Sabrina [name] elected to proceed.   Fonda FORBES Rim MD   HPI: Sabrina [name] is a 83 y.o. (1940/12/14) female presenting in follow up s/p right groin exploration, redo CFA patch plasty with ipsilateral GSV on 03/30/22.  Prior surgeries were performed at [facility] and include: Right renal artery stenting 4mm / Left renal artery stenting (6mm) (2015) Left subclavian artery stenting 9mm (2023) Bilateral common iliac artery stenting Bilateral external iliac stenting Left common femoral, profunda femoris, and superficial femoral artery endarterectomies Right common femoral, profunda femoris, and superficial femoral artery endarterectomies Stent placement to left external iliac artery with 8 mm diameter by 4 cm length of life star stent  My interventions: 09/2022 balloon angioplasty of the right EIA, bilateral renal angioplasty  [id number] Angio for elevated velocities in the iliac artery stents.  Angiography demonstrated widely patent stents bilaterally.  On exam today, Sabrina [name] was doing well.  Stated she is working with physical therapy due to issues with her back.  She notes some right thigh pain and weakness with flexion.  No significant claudication, ischemic rest pain, tissue loss.  Creatinine 1.8 at last check, slightly worsened over the last 6 months.  She spent a month down in [place]  with her daughter, with plans to move permanently, but has yet to make this jump.    Denies food fear, weight loss.  Denies stroke, TIA, amaurosis symptoms. Denies abdominal pain, back pain.   The pt is  on a daily aspirin .   Other AC:  plavix  The pt is  on medication for hypertension.   The pt is not diabetic.  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 b.) TTE 11/16/2021: EF >55%; mild AS (MPG 9 mmHg)   CAD (coronary artery disease)    a.) s/p 3v CABG 03/29/2000   Cardiac arrest [facility])    Carotid artery stenosis    a.) s/p LEFT CEA 09/09/2003. b.) Carotid doppler 88/78/7980: 1-39% LICA, CTO RICA; subclavian stenosis   Cataracts, bilateral    Cervical spondylosis without myelopathy    Chronic diastolic CHF (congestive heart failure), NYHA class 3 (HCC)    a.) TTE 05/27/2016: EF >55%, mild LA enlargement, triv PR, mild MR, mod TR; G3DD. b.) TTE 12/12/2017: EF >55%, mild LVH, BAE, mild MR/PR, mod TR; RVSP 52.8 mmHg. c.) TTE 03/18/2020: EF >55%, BAD, AS (MPG 8.7 mmHg); triv MR, mild TR/PR. d.) TTE 11/16/2021: EF >55%, LVH, G1DD, triv MR, mild PR, mod TR; AS (MPG 9 mmHg); MS (MPG 5 mmHg)   Chronic kidney disease, stage III (moderate) (HCC)    Chronic narcotic use 06/24/2014   Chronic pain syndrome    GERD (gastroesophageal reflux disease)    History of 2019 novel coronavirus disease (COVID-19) 09/22/2021   Hyperlipidemia    Hypertension    Long term current use of antithrombotics/antiplatelets  a.) on daily DAPT therapy (ASA + clopidogrel )   Lumbar stenosis with neurogenic claudication    Mitral stenosis 11/16/2021   a.) TTE 11/16/2021: EF >55%; mod MS (MPG 5 mmHg)   Osteoarthritis of hip    Postoperative wound infection 03/29/2022   Pulmonary hypertension (HCC) 12/12/2017   a.) TTE 12/12/2017: mild; RVSP 52.8 mmHg   PVD (peripheral vascular disease) (HCC)    Renal artery stenosis (HCC)    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    Past Surgical History:  Procedure Laterality Date   ABDOMINAL AORTOGRAM W/LOWER EXTREMITY N/A 10/06/2022   Procedure: ABDOMINAL AORTOGRAM W/LOWER EXTREMITY;  Surgeon: Lanis Fonda BRAVO, MD;  Location: [facility] INVASIVE CV LAB;  Service: Cardiovascular;  Laterality: N/A;   ABDOMINAL AORTOGRAM W/LOWER EXTREMITY N/A 05/04/2023   Procedure: ABDOMINAL AORTOGRAM W/LOWER EXTREMITY;  Surgeon: Lanis Fonda BRAVO, MD;  Location: [facility] INVASIVE CV LAB;  Service: Cardiovascular;  Laterality: N/A;   ABDOMINAL HYSTERECTOMY  1976   CAROTID ARTERY ANGIOPLASTY Left    CAROTID ENDARTERECTOMY Left 09/09/2003   Procedure: CAROTID ENDARTERECTOMY; Location: [facility]; Surgeon: Charlie Lango, MD   COLONOSCOPY WITH PROPOFOL  N/A 08/16/2017   Procedure: COLONOSCOPY WITH PROPOFOL ;  Surgeon: Jinny Carmine, MD;  Location: [facility] ENDOSCOPY;  Service: Endoscopy;  Laterality: N/A;   CORONARY ANGIOPLASTY WITH STENT PLACEMENT  2000   CORONARY ARTERY BYPASS GRAFT N/A 03/29/2000   Procedure: 3v CORONARY ARTERY BYPASS GRAFT; Location: [facility]   CYSTOSCOPY WITH STENT PLACEMENT Bilateral    ENDARTERECTOMY FEMORAL Bilateral 03/17/2022   Procedure: ENDARTERECTOMY FEMORAL ( BILATERAL SFA STENT);  Surgeon: Jama Cordella MATSU, MD;  Location: [facility] ORS;  Service: Vascular;  Laterality: Bilateral;   ENDARTERECTOMY FEMORAL Right 03/30/2022   Procedure: RE-EXPOSURE OF RIGHT COMMON FEMORAL ARTERY, RE-DO RIGHT FEMORAL ENDARTERECTOMY WITH VEIN PATCH;  Surgeon: Lanis Fonda BRAVO, MD;  Location: [facility] OR;  Service: Vascular;  Laterality: Right;  WOUND VAC   ESOPHAGOGASTRODUODENOSCOPY (EGD) WITH PROPOFOL  N/A 08/16/2017   Procedure: ESOPHAGOGASTRODUODENOSCOPY (EGD) WITH PROPOFOL ;  Surgeon: Jinny Carmine, MD;  Location: [facility] ENDOSCOPY;  Service: Endoscopy;   Laterality: N/A;   ESOPHAGOGASTRODUODENOSCOPY (EGD) WITH PROPOFOL  N/A 06/29/2018   Procedure: ESOPHAGOGASTRODUODENOSCOPY (EGD) WITH PROPOFOL ;  Surgeon: Janalyn Keene NOVAK, MD;  Location: [facility] ENDOSCOPY;  Service: Endoscopy;  Laterality: N/A;   GROIN DEBRIDEMENT Right 03/30/2022   Procedure: RIGHT GROIN IRRIGATION & DEBRIDEMENT;  Surgeon: Lanis Fonda BRAVO, MD;  Location: [facility] OR;  Service: Vascular;  Laterality: Right;   INSERTION OF ILIAC STENT Left 03/17/2022   Procedure: INSERTION OF ILIAC STENT;  Surgeon: Jama Cordella MATSU, MD;  Location: [facility] ORS;  Service: Vascular;  Laterality: Left;   LAPAROSCOPIC CHOLECYSTECTOMY Left 10/26/1999   Procedure: LAPAROSCOPIC CHOLECYSTECTOMY; Location: [facility]; Surgeon: Unknown Sharps, MD   LEFT HEART CATH AND CORONARY ANGIOGRAPHY N/A 11/18/2021   Procedure: LEFT HEART CATH AND CORONARY ANGIOGRAPHY;  Surgeon: Florencio Cara BIRCH, MD;  Location: [facility] INVASIVE CV LAB;  Service: Cardiovascular;  Laterality: N/A;   LEFT HEART CATH AND CORS/GRAFTS ANGIOGRAPHY Left 11/19/2002   Procedure: LEFT HEART CATH AND CORS/GRAFTS ANGIOGRAPHY; Location: [facility]; Surgeon: Margie Florencio, MD   LEFT HEART CATH AND CORS/GRAFTS ANGIOGRAPHY Left 09/17/2003   Procedure: LEFT HEART CATH AND CORS/GRAFTS ANGIOGRAPHY; Location: [facility]; Surgeon: Margie Florencio, MD   LOWER EXTREMITY ANGIOGRAPHY  Right 01/06/2022   Procedure: Lower Extremity Angiography;  Surgeon: Jama Cordella MATSU, MD;  Location: [facility] INVASIVE CV LAB;  Service: Cardiovascular;  Laterality: Right;   PERIPHERAL VASCULAR BALLOON ANGIOPLASTY  10/06/2022   Procedure: PERIPHERAL VASCULAR BALLOON ANGIOPLASTY;  Surgeon: Lanis Fonda BRAVO, MD;  Location: [facility] INVASIVE CV LAB;  Service: Cardiovascular;;  left iliac and bilateral renal   RENAL ARTERY ANGIOPLASTY Bilateral 12/2013   TOE AMPUTATION Right    small toe   TONSILLECTOMY AND ADENOIDECTOMY     TOTAL HIP ARTHROPLASTY Left 2005   TOTAL HIP ARTHROPLASTY Right 2015   UPPER EXTREMITY  ANGIOGRAPHY Left 11/23/2021   Procedure: UPPER EXTREMITY ANGIOGRAPHY;  Surgeon: Marea Selinda RAMAN, MD;  Location: [facility] INVASIVE CV LAB;  Service: Cardiovascular;  Laterality: Left;    Social History   Socioeconomic History   Marital status: Divorced    Spouse name: Not on file   Number of children: 3   Years of education: Not on file   Highest education level: Not on file  Occupational History   Occupation: retired  Tobacco Use   Smoking status: Former    Current packs/day: 0.00    Types: Cigarettes    Quit date: 10/25/1998    Years since quitting: 25.6    Passive exposure: Past   Smokeless tobacco: Never  Vaping Use   Vaping status: Never Used  Substance and Sexual Activity   Alcohol use: No    Alcohol/week: 0.0 standard drinks of alcohol   Drug use: Never   Sexual activity: Not Currently  Other Topics Concern   Not on file  Social History Narrative   Daughter Sabrina [name] ([place]); 1 in [employer]; 1 in [place]   Lives alone   Social Drivers of Health   Financial Resource Strain: Low Risk  (05/14/2024)   Overall Financial Resource Strain (CARDIA)    Difficulty of Paying Living Expenses: Not hard at all  Food Insecurity: No Food Insecurity (05/14/2024)   Hunger Vital Sign    Worried About Running Out of Food in the Last Year: Never true    Ran Out of Food in the Last Year: Never true  Transportation Needs: No Transportation Needs (05/14/2024)   PRAPARE - Administrator, Civil Service (Medical): No    Lack of Transportation (Non-Medical): No  Physical Activity: Inactive (05/14/2024)   Exercise Vital Sign    Days of Exercise per Week: 1 day    Minutes of Exercise per Session: 0 min  Stress: No Stress Concern Present (05/14/2024)   [employer] of Occupational Health - Occupational Stress Questionnaire    Feeling of Stress: Not at all  Social Connections: Moderately Integrated (05/14/2024)   Social Connection and Isolation Panel    Frequency of Communication with  Friends and Family: More than three times a week    Frequency of Social Gatherings with Friends and Family: More than three times a week    Attends Religious Services: More than 4 times per year    Active Member of [employer] or Organizations: Yes    Attends Banker Meetings: More than 4 times per year    Marital Status: Divorced  Intimate Partner Violence: Not At Risk (05/14/2024)   Humiliation, Afraid, Rape, and Kick questionnaire    Fear of Current or Ex-Partner: No    Emotionally Abused: No    Physically Abused: No    Sexually Abused: No    Family History  Problem Relation Age of Onset   Stroke Mother  Hypertension Mother    Diabetes Mother    Hypertension Father    Heart disease Sister        MI   Multiple sclerosis Daughter    Multiple sclerosis Son    Cerebral aneurysm Son    Seizures Son    Cerebral aneurysm Son    Breast cancer Paternal Aunt 36    Current Facility-Administered Medications  Medication Dose Route Frequency Provider Last Rate Last Admin   aspirin  chewable tablet    PRN Tesia Lybrand E, MD   81 mg at 06/20/24 1240   clopidogrel  (PLAVIX ) tablet    PRN Kamari Buch E, MD   75 mg at 06/20/24 1240   fentaNYL  (SUBLIMAZE ) injection    PRN Lonnel Gjerde E, MD   50 mcg at 06/20/24 1226   Heparin  (Porcine) in NaCl 2000-0.9 UNIT/L-% SOLN    PRN Lanis Fonda BRAVO, MD   1,000 mL at 06/20/24 1133   heparin  sodium (porcine) injection    PRN Lanis Fonda BRAVO, MD   7,000 Units at 06/20/24 1150   iodixanol  (VISIPAQUE ) 320 MG/ML injection    PRN Lanis Fonda BRAVO, MD   75 mL at 06/20/24 1229   lidocaine  (PF) (XYLOCAINE ) 1 % injection    PRN Lanis Fonda BRAVO, MD   10 mL at 06/20/24 1140   midazolam  (VERSED ) injection    PRN Cacie Gaskins E, MD   0.5 mg at 06/20/24 1142   sodium chloride  flush (NS) 0.9 % injection 3 mL  3 mL Intravenous PRN Suraya Vidrine E, MD        Allergies  Allergen Reactions   Celexa  [Citalopram ] Anaphylaxis    Throat closing     Dilaudid  [Hydromorphone  Hcl] Nausea And Vomiting   Effient [Prasugrel] Itching   Hydrochlorothiazide  Other (See Comments)    Decreased GFR (Nov 2015)   Liothyronine      Hair fell out, caused headaches    Nsaids     CKD stage III - avoid nephrotoxic drugs   Nubain [Nalbuphine Hcl]     Burning sensation in back   Penicillins Itching   Statins Itching     REVIEW OF SYSTEMS:   [X]  denotes positive finding, [ ]  denotes negative finding Cardiac  Comments:  Chest pain or chest pressure:    Shortness of breath upon exertion:    Short of breath when lying flat:    Irregular heart rhythm:        Vascular    Pain in calf, thigh, or hip brought on by ambulation:    Pain in feet at night that wakes you up from your sleep:     Blood clot in your veins:    Leg swelling:         Pulmonary    Oxygen at home:    Productive cough:     Wheezing:         Neurologic    Sudden weakness in arms or legs:     Sudden numbness in arms or legs:     Sudden onset of difficulty speaking or slurred speech:    Temporary loss of vision in one eye:     Problems with dizziness:         Gastrointestinal    Blood in stool:     Vomited blood:         Genitourinary    Burning when urinating:     Blood in urine:        Psychiatric  Major depression:         Hematologic    Bleeding problems:    Problems with blood clotting too easily:        Skin    Rashes or ulcers:        Constitutional    Fever or chills:      PHYSICAL EXAMINATION:  Vitals:   06/20/24 0808 06/20/24 1122  BP: (!) 148/54   Pulse: 67   Resp: 18   Temp: 97.8 F (36.6 C)   TempSrc: Oral   SpO2: 100% 100%  Weight: 73.5 kg   Height: 5' (1.524 m)      General:  WDWN in NAD; vital signs documented above Gait: Not observed HENT: WNL, normocephalic Pulmonary: normal non-labored breathing , without wheezing Cardiac: regular HR, Abdomen: soft, NT, no masses Skin: without rashes Vascular Exam/Pulses:  Right Left   Radial 2+ (normal) 2+ (normal)  Ulnar 2+ (normal) 2+ (normal)  Femoral    Popliteal    DP nonpalpable nonpalpable  PT nonpalpable nonpalpable   Extremities: without ischemic changes, without Gangrene , without cellulitis; without open wounds;  Right groin wound with 3 small areas opened, probe less than 1 cm, Musculoskeletal: no muscle wasting or atrophy  Neurologic: A&O X 3;  No focal weakness or paresthesias are detected Psychiatric:  The pt has Normal affect.   Non-Invasive Vascular Imaging:    +------------------+--------+--------+-------+  Right Renal ArteryPSV cm/sEDV cm/sComment  +------------------+--------+--------+-------+  Origin             203      22            +------------------+--------+--------+-------+  Proximal           184      19            +------------------+--------+--------+-------+  Mid                131      13            +------------------+--------+--------+-------+  Distal             129      9             +------------------+--------+--------+-------+   +-----------------+--------+--------+-------+  Left Renal ArteryPSV cm/sEDV cm/sComment  +-----------------+--------+--------+-------+  Origin            374      38            +-----------------+--------+--------+-------+  Proximal          240      13            +-----------------+--------+--------+-------+  Mid               122      17            +-----------------+--------+--------+-------+  Distal             84      18            +-----------------+--------+--------+-------+    Abdominal Aorta Findings:  +-------------+-------+----------+---------+-----------+--------+----------  ----+  Location    AP (cm)Trans (cm)PSV      Waveform   ThrombusComments                                       (cm/s)                                       +-------------+-------+----------+---------+-----------+--------+----------   ----+  Proximal    2.40   2.40      73                                           +-------------+-------+----------+---------+-----------+--------+----------  ----+  Distal      3.40   3.40      130                         fusiform         +-------------+-------+----------+---------+-----------+--------+----------  ----+  RT CIA Prox                   198      biphasic                            +-------------+-------+----------+---------+-----------+--------+----------  ----+  RT CIA Mid                    200      biphasic                            +-------------+-------+----------+---------+-----------+--------+----------  ----+  RT CIA Distal                 318      biphasic                            +-------------+-------+----------+---------+-----------+--------+----------  ----+  RT EIA Prox                   269      biphasic                            +-------------+-------+----------+---------+-----------+--------+----------  ----+  RT EIA Mid                    299      multiphasic                         +-------------+-------+----------+---------+-----------+--------+----------  ----+  RT EIA Distal                 305      multiphasic                         +-------------+-------+----------+---------+-----------+--------+----------  ----+  LT CIA Prox                                               not  visualized  +-------------+-------+----------+---------+-----------+--------+----------  ----+  LT CIA Mid                                                not  visualized  +-------------+-------+----------+---------+-----------+--------+----------  ----+  LT CIA Distal                 226      biphasic                            +-------------+-------+----------+---------+-----------+--------+----------  ----+  Left Stent(s):   +--------------------+--------+---------------+-----------+----------------  ----+  External Iliac      PSV cm/sStenosis       Waveform   Comments               Artery                                                                       +--------------------+--------+---------------+-----------+----------------  ----+  Prox to Stent       226                    biphasic   distal common  iliac                                                         artery                 +--------------------+--------+---------------+-----------+----------------  ----+  Proximal Stent      293                    multiphasic                       +--------------------+--------+---------------+-----------+----------------  ----+  Mid Stent           312     50-99% stenosisbiphasic                          +--------------------+--------+---------------+-----------+----------------  ----+  Distal Stent        252                    biphasic                          +--------------------+--------+---------------+-----------+----------------  ----+  Distal to Stent     209                    multiphasic                       +--------------------+--------+---------------+-----------+----------------  ----+   8/14 +-------+-----------+-----------+------------+------------+  ABI/TBIToday's ABIToday's TBIPrevious ABIPrevious TBI  +-------+-----------+-----------+------------+------------+  Right .57        .39        .53         .42           +-------+-----------+-----------+------------+------------+  Left  .57        .48        .65         .43           +-------+-----------+-----------+------------+------------+    Left Carotid Findings:  +----------+--------+--------+--------+------------------+--------+           PSV cm/sEDV cm/sStenosisPlaque DescriptionComments   +----------+--------+--------+--------+------------------+--------+  CCA Prox  158     20                                          +----------+--------+--------+--------+------------------+--------+  CCA Distal88      19                                          +----------+--------+--------+--------+------------------+--------+  ICA Prox  58      8               heterogenous                +----------+--------+--------+--------+------------------+--------+  ICA Mid   83      24      1-39%   heterogenous                +----------+--------+--------+--------+------------------+--------+  ICA Distal72      24                                          +----------+--------+--------+--------+------------------+--------+  ECA      77      7                                           +----------+--------+--------+--------+------------------+--------+   +----------+--------+--------+-----------------------------+---------------  ----+           PSV cm/sEDV cm/sDescribe                     Arm Pressure  (mmHG)  +----------+--------+--------+-----------------------------+---------------  ----+  [id number]    0       Stenotic and Turbulent,      163                                             proximal stent not visualized                      +----------+--------+--------+-----------------------------+---------------  ----+   +---------+--------+--+--------+-+---------+  VertebralPSV cm/s35EDV cm/s6Antegrade  +---------+--------+--+--------+-+---------+    ASSESSMENT/PLAN: Sabrina [name] is a 83 y.o. female presenting in follow up s/p right groin exploration, redo CFA patch plasty with ipsilateral GSV.  In December 2023 she underwent right-sided EIA drug-coated balloon angioplasty, bilateral renal artery angioplasty for assisted primary patency of bilateral renal artery stents.  Last imaging occurred 04/2023 due to concern  for threatened stents.  Imaging demonstrated widely patent iliac stents bilaterally.  Imaging studies were reviewed.  Iliac velocities are elevated, but relatively stable -similar to the velocities at the time of her last angiogram.  ABIs stable, carotid disease stable.  I am most concerned about her renal arteries and worsening kidney function.  Renal artery duplex ultrasound demonstrates critical stenosis at the left renal artery origin.  I am worried that the stent has in-stent restenosis, and is threatened.  This was plastied with a drug-coated balloon 2 years ago for a similar reason.  I had a long conversation with Sabrina [name] regarding the above.  I think she would benefit from renal artery angiogram with possible intervention in an effort to define the level of stenosis and treat to prevent thrombosis of the previously placed stents.   Fonda FORBES Rim, MD [facility] [phone]  Total time of patient  care including pre-visit research, consultation, and documentation greater than 40 minutes

## 2024-06-20 NOTE — Progress Notes (Signed)
 Contacted Dr. Lanis for order clarification per verbal order. Pts bedrest is 3 hours not 4.

## 2024-06-20 NOTE — Discharge Instructions (Addendum)
 Femoral Site Care The following information offers guidance on how to care for yourself after your procedure. Your health care provider may also give you more specific instructions. If you have problems or questions, contact your health care provider. What can I expect after the procedure? After the procedure, it is common to have bruising and tenderness at the incision site. This usually fades within 1-2 weeks. Follow these instructions at home: Incision site care  Follow instructions from your health care provider about how to take care of your incision site. Make sure you: Wash your hands with soap and water for at least 20 seconds before and after you change your bandage (dressing). If soap and water are not available, use hand sanitizer. Remove your dressing in 24 hours. Leave stitches (sutures), skin glue, or adhesive strips in place. These skin closures may need to stay in place for 2 weeks or longer. If adhesive strip edges start to loosen and curl up, you may trim the loose edges. Do not remove adhesive strips completely unless your health care provider tells you to do that. Do not take baths, swim, or use a hot tub for at least 1 week. You may shower 24 hours after the procedure or as told by your health care provider. Gently wash the incision site with plain soap and water. Pat the area dry with a clean towel. Do not rub the site. This may cause bleeding. Do not apply powder or lotion to the site. Keep the site clean and dry. Check your femoral site every day for signs of infection. Check for: Redness, swelling, or pain. Fluid or blood. Warmth. Pus or a bad smell. Activity If you were given a sedative during the procedure, it can affect you for several hours. Do not drive or operate machinery until your health care provider says that it is safe. Rest as told by your health care provider. Avoid sitting for a long time without moving. Get up to take short walks every 1-2 hours. This  is important to improve blood flow and breathing. Ask for help if you feel weak or unsteady. Return to your normal activities as told by your health care provider. Ask your health care provider what activities are safe for you and when you can return to work. Avoid activities that take a lot of effort for the first 2-3 days after your procedure, or as long as directed. Do not lift anything that is heavier than 10 lb (4.5 kg), or the limit that you are told, until your health care provider says that it is safe. General instructions Take over-the-counter and prescription medicines only as told by your health care provider. If you will be going home right after the procedure, plan to have a responsible adult care for you for the time you are told. This is important. Keep all follow-up visits. This is important. Contact a health care provider if: You have a fever or chills. You have any of these signs of infection at your incision site: Redness, swelling, or pain. Fluid or blood. Warmth. Pus or a bad smell. Get help right away if: The incision area swells very fast. The incision area is bleeding, and the bleeding does not stop when you hold steady pressure on the area. Your leg or foot becomes pale, cool, tingly, or numb. These symptoms may represent a serious problem that is an emergency. Do not wait to see if the symptoms will go away. Get medical help right away. Call your local emergency  services (911 in the [place] Do not drive yourself to the hospital. Summary After the procedure, it is common to have bruising and tenderness that fade within 1-2 weeks. Check your femoral site every day for signs of infection. Do not lift anything that is heavier than 10 lb (4.5 kg), or the limit that you are told, until your health care provider says that it is safe. Get help right away if the incision area swells very fast, you have bleeding at the incision area that does not stop, or your leg or foot  becomes pale, cool, or numb. This information is not intended to replace advice given to you by your health care provider. Make sure you discuss any questions you have with your health care provider. Document Revised: 07/01/2021 Document Reviewed: 11/30/2020 Elsevier Patient Education  2024 [employer].

## 2024-06-20 NOTE — Clinical Note (Incomplete)
 Discharge instructions reviewed with pt and friend Do ma at bedside. Denies questions or concerns.

## 2024-06-20 NOTE — Op Note (Signed)
 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  Ultrasound-guided micropuncture access of the right common femoral artery in retrograde fashion 2.  Aortogram 3.  Selective imaging of the left renal artery, selective imaging of the right renal artery 4.  Drug-coated balloon angioplasty left renal artery 4 x 40 mm balloon 5.  Drug-coated balloon angioplasty of the right renal artery 5 x 40 mm balloon 6.  Dedicated imaging of the bilateral common, external iliac arteries 7.  Device assisted closure-Mynx 8.  Moderate sedation time 55 minutes, contrast volume 75 mL   Indications:   Patient is an 83 year old female with multiple endovascular inventions at outside hospital whom I met several years ago.  I have performed bilateral renal balloon angioplasty, right EIA angioplasty in 2023, followed by balloon angioplasty of the iliac arteries for in-stent restenosis as well.  Imaging today demonstrated concern for left renal artery stenosis with velocities greater than 400.  I was concerned that the in-stent restenosis could lead to thrombosis of the stent.  After discussing the risks and benefits of diagnostic angiography with possible intervention, Sabrina [name] elected to proceed.   Findings:   Bilateral renal arteries with greater than 80% flow-limiting stenosis at the mid stent Widely patent infrarenal abdominal aorta.  No flow-limiting stenosis identified in the aortoiliac segments bilaterally.    Procedure:  The patient was identified in the holding area and taken to room 8.  The patient was then placed supine on the table and prepped and draped in the usual sterile fashion.  A time out was called.  Ultrasound was used to evaluate the right common femoral artery.  It was patent .  A digital ultrasound  image was acquired.  A micropuncture needle was used to access the right common femoral artery under ultrasound guidance.  An 018 wire was advanced without resistance and a micropuncture sheath was placed.  The 018 wire was removed and a benson wire was placed.  The micropuncture sheath was exchanged for a 5 french sheath.  An omniflush catheter was advanced over the wire to the level of T12, and abdominal angiogram followed.  See results above.  I elected to intervene on the bilateral renal arteries.  A short 6 French sheath was placed, and the patient heparinized.  Next, a 6 [place] IM guide catheter was brought onto the field and positioned at the level of the renal arteries.  I began with the left renal artery.  The IM catheter was placed in the stent and diagnostic angiogram followed.  This was cannulated using a 0.014 wire followed by 4 x 40 mm drug-coated balloon.  We did not have a smaller size in regards to length.  This was inflated for 2 minutes with follow-up angiography demonstrating excellent result.  There was no residual flow-limiting stenosis.  Next, the wire was removed, and using the IM catheter, I was able to cannulate the right renal artery.  Diagnostic angiogram followed, and in similar fashion, a 5 x 40 mm balloon was brought onto the field, laid across the stents, and inflated.  Follow-up angiography demonstrated excellent result with resolution of flow-limiting stenosis.  Angiography of the infrarenal aorta and iliacs followed.  See results above.  No intervention was necessary.  Patient was closed using a minx device without issue.  Impression: Successful bilateral drug-coated balloon angioplasty of the renal  arteries for in-stent restenosis.   Fonda FORBES Rim MD [facility] Office: [phone]

## 2024-06-21 ENCOUNTER — Encounter (HOSPITAL_COMMUNITY): Payer: Self-pay | Admitting: Vascular Surgery

## 2024-06-26 ENCOUNTER — Encounter: Payer: Self-pay | Admitting: Nurse Practitioner

## 2024-06-26 ENCOUNTER — Ambulatory Visit: Attending: Nurse Practitioner | Admitting: Nurse Practitioner

## 2024-06-26 VITALS — BP 148/48 | HR 64 | Temp 97.2°F | Resp 16 | Ht 60.0 in | Wt 162.0 lb

## 2024-06-26 DIAGNOSIS — M4726 Other spondylosis with radiculopathy, lumbar region: Secondary | ICD-10-CM

## 2024-06-26 DIAGNOSIS — Z79899 Other long term (current) drug therapy: Secondary | ICD-10-CM | POA: Insufficient documentation

## 2024-06-26 DIAGNOSIS — M47816 Spondylosis without myelopathy or radiculopathy, lumbar region: Secondary | ICD-10-CM | POA: Diagnosis not present

## 2024-06-26 DIAGNOSIS — M5416 Radiculopathy, lumbar region: Secondary | ICD-10-CM | POA: Diagnosis not present

## 2024-06-26 DIAGNOSIS — G8929 Other chronic pain: Secondary | ICD-10-CM | POA: Insufficient documentation

## 2024-06-26 DIAGNOSIS — G894 Chronic pain syndrome: Secondary | ICD-10-CM | POA: Insufficient documentation

## 2024-06-26 DIAGNOSIS — Z96643 Presence of artificial hip joint, bilateral: Secondary | ICD-10-CM | POA: Diagnosis not present

## 2024-06-26 MED ORDER — OXYCODONE-ACETAMINOPHEN 5-325 MG PO TABS: 1.0000 | ORAL_TABLET | Freq: Three times a day (TID) | ORAL | 0 refills | Status: DC | PRN

## 2024-06-26 NOTE — Progress Notes (Signed)
 Nursing Pain Medication Assessment:  Safety precautions to be maintained throughout the outpatient stay will include: orient to surroundings, keep bed in low position, maintain call bell within reach at all times, provide assistance with transfer out of bed and ambulation.  Medication Inspection Compliance: Sabrina [name] did not comply with our request to bring her pills to be counted. She was reminded that bringing the medication bottles, even when empty, is a requirement.  Medication: None brought in. Pill/Patch Count: None available to be counted. Bottle Appearance: No container available. Did not bring bottle(s) to appointment. Filled Date: N/A Last Medication intake:  Ran out of medicine more than 48 hours ago

## 2024-06-26 NOTE — Progress Notes (Signed)
 PROVIDER NOTE: Interpretation of information contained herein should be left to medically-trained personnel. Specific patient instructions are provided elsewhere under Patient Instructions section of medical record. This document was created in part using AI and STT-dictation technology, any transcriptional errors that may result from this process are unintentional.  Patient: Sabrina [name]  Service: E/M   PCP: [name], MD  DOB: Apr 04, 1941  DOS: 06/26/2024  Provider: Emmy MARLA Blanch, NP  MRN: [id number]  Delivery: Face-to-face  Specialty: Interventional Pain Management  Type: Established Patient  Setting: Ambulatory outpatient facility  Specialty designation: 09  Referring Prov.: [name], MD  Location: Outpatient office facility       History of present illness (HPI) [name], a 83 y.o. year old female, is here today because of her Lumbar facet arthropathy [M47.816]. [name] primary complain today is Back Pain (Lower Back Pain )  Pertinent problems: [name] has Hip pain, bilateral; Peripheral vascular disease (HCC); Generalized anxiety disorder; Chronic right hip pain; Cervical spondylosis without myelopathy; Chronic radicular lumbar pain; and Chronic pain syndrome on their pertinent problem list   Pain Assessment: Severity of Chronic pain is reported as a 10-Worst pain ever/10. Location: Back Lower/Denies. Onset: More than a month ago. Quality: Aching. Timing: Intermittent. Modifying factor(s): Sitting down. Vitals:  height is 5' (1.524 m) and weight is 162 lb (73.5 kg). Her temporal temperature is 97.2 F (36.2 C) (abnormal). Her blood pressure is 148/48 (abnormal) and her pulse is 64. Her respiration is 16 and oxygen saturation is 100%.  BMI: Estimated body mass index is 31.64 kg/m as calculated from the following:   Height as of this encounter: 5' (1.524 m).   Weight as of this encounter: 162 lb (73.5 kg).  Last encounter: 05/29/2024. Last procedure:  Visit date not found.  Reason for encounter: medication management. No change in medical history since last visit.  Patient's pain is at baseline.  Patient continues multimodal pain regimen as prescribed.  States that it provides pain relief and improvement in functional status.  Pharmacotherapy Assessment   Hydrocodone -acetaminophen  5-325 mg  every 8 hours as needed for pain. MME=15 (Discontinue 06/26/2024) Oxycodone -acetaminophen  (Percocet) 5-325 mg every 8 hours as needed for pain. (Started on 06/26/2024) Monitoring: [place] PMP: PDMP reviewed during this encounter.       Pharmacotherapy: No side-effects or adverse reactions reported. Compliance: No problems identified. Effectiveness: Clinically acceptable.  [name], [place]  06/26/2024  2:22 PM  Sign when Signing Visit Nursing Pain Medication Assessment:  Safety precautions to be maintained throughout the outpatient stay will include: orient to surroundings, keep bed in low position, maintain call bell within reach at all times, provide assistance with transfer out of bed and ambulation.  Medication Inspection Compliance: Ms. Summerfield did not comply with our request to bring her pills to be counted. She was reminded that bringing the medication bottles, even when empty, is a requirement.  Medication: None brought in. Pill/Patch Count: None available to be counted. Bottle Appearance: No container available. Did not bring bottle(s) to appointment. Filled Date: N/A Last Medication intake:  Ran out of medicine more than 48 hours ago    UDS:  Summary  Date Value Ref Range Status  05/01/2024 FINAL  Final    Comment:    ==================================================================== [employer] Select 13 (MW) ==================================================================== Test  Result       Flag       Units  Drug Present not Declared for Prescription Verification   Buprenorphine                   11            UNEXPECTED ng/mg creat   Norbuprenorphine               >1852        UNEXPECTED ng/mg creat    Source of buprenorphine  is a scheduled prescription medication.    Norbuprenorphine is an expected metabolite of buprenorphine .  Drug Absent but Declared for Prescription Verification   Hydrocodone                     Not Detected UNEXPECTED ng/mg creat ==================================================================== Test                      Result    Flag   Units      Ref Range   Creatinine              54               mg/dL      >=79 ==================================================================== Declared Medications:  The flagging and interpretation on this report are based on the  following declared medications.  Unexpected results may arise from  inaccuracies in the declared medications.   **Note: The testing scope of this panel includes these medications:   Hydrocodone  (Norco)   **Note: The testing scope of this panel does not include the  following reported medications:   Acetaminophen  (Norco)  Amlodipine  (Norvasc )  Aspirin   Clopidogrel  (Plavix )  Duloxetine  (Cymbalta )  Ezetimibe  (Zetia )  Furosemide  (Lasix )  Hydralazine  (Apresoline )  Hydroxyzine  (Vistaril )  Iron  Normodyne   Topical  Vitamin C  ==================================================================== For clinical consultation, please call [phone]. ====================================================================     No results found for: CBDTHCR No results found for: D8THCCBX No results found for: D9THCCBX  ROS  Constitutional: Denies any fever or chills Gastrointestinal: No reported hemesis, hematochezia, vomiting, or acute GI distress Musculoskeletal: Denies any acute onset joint swelling, redness, loss of ROM, or weakness Neurological: No reported episodes of acute onset apraxia, aphasia, dysarthria, agnosia, amnesia, paralysis, loss of coordination, or loss of  consciousness  Medication Review  HYDROcodone -acetaminophen , amLODipine , aspirin , clopidogrel , ezetimibe , ferrous sulfate  ER, furosemide , hydrALAZINE , labetalol , losartan , methocarbamol , mirabegron  ER, nortriptyline , oxyCODONE -acetaminophen , pantoprazole , and ranolazine   History Review  Allergy: [name] is allergic to celexa  [citalopram ], dilaudid  [hydromorphone  hcl], effient [prasugrel], hydrochlorothiazide , liothyronine , nsaids, nubain [nalbuphine hcl], penicillins, and statins. Drug: [name]  reports no history of drug use. Alcohol:  reports no history of alcohol use. Tobacco:  reports that she quit smoking about 25 years ago. Her smoking use included cigarettes. She has been exposed to tobacco smoke. She has never used smokeless tobacco. Social: [name]  reports that she quit smoking about 25 years ago. Her smoking use included cigarettes. She has been exposed to tobacco smoke. She has never used smokeless tobacco. She reports that she does not drink alcohol and does not use drugs. Medical:  has a past medical history of Abdominal aortic ectasia (HCC) (07/13/2017), Amputation of fifth toe, right, traumatic, subsequent encounter (HCC) (06/18/2019), Anemia of chronic kidney failure, Anxiety, Aortic stenosis (03/18/2020), CAD (coronary artery disease), Cardiac arrest [facility]), Carotid artery stenosis, Cataracts, bilateral, Cervical spondylosis without myelopathy, Chronic diastolic CHF (congestive heart failure), NYHA class 3 (HCC), Chronic kidney  disease, stage III (moderate) (HCC), Chronic narcotic use (06/24/2014), Chronic pain syndrome, GERD (gastroesophageal reflux disease), History of 2019 novel coronavirus disease (COVID-19) (09/22/2021), Hyperlipidemia, Hypertension, Long term current use of antithrombotics/antiplatelets, Lumbar stenosis with neurogenic claudication, Mitral stenosis (11/16/2021), Osteoarthritis of hip, Postoperative wound infection (03/29/2022), Pulmonary hypertension  (HCC) (12/12/2017), PVD (peripheral vascular disease) (HCC), Renal artery stenosis (HCC), 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 Surgical: [name]  has a past surgical history that includes Laparoscopic cholecystectomy (Left, 10/26/1999); Tonsillectomy and adenoidectomy; Abdominal hysterectomy (1976); Total hip arthroplasty (Left, 2005); Carotid angioplasty (Left); Coronary angioplasty with stent (2000); Toe amputation (Right); Cystoscopy with stent placement (Bilateral); Total hip arthroplasty (Right, 2015); Coronary artery bypass graft (N/A, 03/29/2000); Carotid endarterectomy (Left, 09/09/2003); Renal artery angioplasty (Bilateral, 12/2013); Colonoscopy with propofol  (N/A, 08/16/2017); Esophagogastroduodenoscopy (egd) with propofol  (N/A, 08/16/2017); Esophagogastroduodenoscopy (egd) with propofol  (N/A, 06/29/2018); LEFT HEART CATH AND CORONARY ANGIOGRAPHY (N/A, 11/18/2021); Upper Extremity Angiography (Left, 11/23/2021); Lower Extremity Angiography (Right, 01/06/2022); LEFT HEART CATH AND CORS/GRAFTS ANGIOGRAPHY (Left, 11/19/2002); LEFT HEART CATH AND CORS/GRAFTS ANGIOGRAPHY (Left, 09/17/2003); Endarterectomy femoral (Bilateral, 03/17/2022); Insertion of iliac stent (Left, 03/17/2022); Groin debridement (Right, 03/30/2022); Endarterectomy femoral (Right, 03/30/2022); ABDOMINAL AORTOGRAM W/LOWER EXTREMITY (N/A, 10/06/2022); PERIPHERAL VASCULAR BALLOON ANGIOPLASTY (10/06/2022); ABDOMINAL AORTOGRAM W/LOWER EXTREMITY (N/A, 05/04/2023); ABDOMINAL AORTOGRAM W/LOWER EXTREMITY (N/A, 06/20/2024); and RENAL INTERVENTION (Bilateral, 06/20/2024). Family: family history includes Breast cancer (age of onset: 25) in her paternal aunt; Cerebral aneurysm in her son and son; Diabetes in her mother; Heart disease in her sister; Hypertension in her father and mother; Multiple sclerosis in her daughter and son; Seizures in her son; Stroke in her  mother.  Laboratory Chemistry Profile   Renal Lab Results  Component Value Date   BUN 18 06/20/2024   CREATININE 1.50 (H) 06/20/2024   LABCREA 86 03/06/2018   GFR 32.48 (L) 08/25/2023   GFRAA 50 (L) 03/10/2020   GFRNONAA 28 (L) 06/05/2024    Hepatic Lab Results  Component Value Date   AST 19 06/05/2024   ALT 13 06/05/2024   ALBUMIN  3.9 06/05/2024   ALKPHOS 86 06/05/2024   LIPASE 16.0 07/06/2017    Electrolytes Lab Results  Component Value Date   NA 139 06/20/2024   K 3.6 06/20/2024   CL 107 06/20/2024   CALCIUM  9.5 06/05/2024   MG 2.5 (H) 03/31/2022   PHOS 3.8 09/23/2022    Bone No results found for: VD25OH, VD125OH2TOT, CI6874NY7, CI7874NY7, 25OHVITD1, 25OHVITD2, 25OHVITD3, TESTOFREE, TESTOSTERONE  Inflammation (CRP: Acute Phase) (ESR: Chronic Phase) Lab Results  Component Value Date   CRP <1.0 02/17/2023   ESRSEDRATE 53 (H) 02/17/2023   LATICACIDVEN 1.0 03/30/2022         Note: Above Lab results reviewed.  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  Ultrasound-guided micropuncture access of the right common femoral  artery in retrograde fashion 2.  Aortogram 3.  Selective imaging of the left renal artery, selective imaging of the  right renal artery 4.  Drug-coated balloon angioplasty left renal artery 4 x 40 mm balloon 5.  Drug-coated balloon angioplasty of the right renal artery 5 x 40 mm  balloon 6.  Dedicated imaging of the bilateral common, external iliac arteries 7.  Device assisted closure-Mynx 8.  Moderate sedation time 55 minutes, contrast  volume 75 mL  Indications:   Patient is an 83 year old female with multiple endovascular inventions at  outside hospital whom I met  several years ago.  I have performed bilateral renal balloon angioplasty, right EIA  angioplasty in 2023, followed by balloon angioplasty of the iliac arteries  for in-stent restenosis as well.  Imaging today demonstrated concern for left renal artery stenosis with  velocities greater than 400.  I was concerned that the in-stent restenosis  could lead to thrombosis of the stent.  After discussing the risks and  benefits of diagnostic angiography with possible intervention, Sabrina [name]  elected to proceed.  Findings:   Bilateral renal arteries with greater than 80% flow-limiting stenosis at  the mid stent Widely patent infrarenal abdominal aorta.  No flow-limiting stenosis  identified in the aortoiliac segments bilaterally.    Procedure:  The patient was identified in the holding area and taken to  room 8.  The patient was then placed supine on the table and prepped and  draped in the usual sterile fashion.  A time out was called.  Ultrasound  was used to evaluate the right common femoral artery.  It was patent .  A  digital ultrasound image was acquired.  A micropuncture needle was used to  access the right common femoral artery under ultrasound guidance.  An 018  wire was advanced without resistance and a micropuncture sheath was  placed.  The 018 wire was removed and a benson wire was placed.  The  micropuncture sheath was exchanged for a 5 french sheath.  An omniflush  catheter was advanced over the wire to the level of T12, and abdominal  angiogram followed.  See results above.  I elected to intervene on the  bilateral renal arteries.  A short 6 French sheath was placed, and the  patient heparinized.  Next, a 6 [place] IM guide catheter was brought onto  the field and positioned at the level of the renal arteries.  I began with  the left renal artery.  The IM catheter was placed in the stent and  diagnostic angiogram followed.  This was cannulated using a 0.014 wire  followed by 4 x  40 mm drug-coated balloon.  We did not have a smaller size  in regards to length.  This was inflated for 2 minutes with follow-up  angiography demonstrating excellent result.  There was no residual  flow-limiting stenosis.  Next, the wire was removed, and using the IM  catheter, I was able to cannulate the right renal artery.  Diagnostic  angiogram followed, and in similar fashion, a 5 x 40 mm balloon was  brought onto the field, laid across the stents, and inflated.  Follow-up  angiography demonstrated excellent result with resolution of flow-limiting  stenosis.  Angiography of the infrarenal aorta and iliacs followed.  See results  above.  No intervention was necessary.  Patient was closed using a minx device without issue.  Impression: Successful bilateral drug-coated balloon angioplasty of the  renal arteries for in-stent restenosis.  Fonda FORBES Rim MD [facility] Office: [phone] Note: Reviewed        Physical Exam  Vitals: BP (!) 148/48 (BP Location: Left Arm, Patient Position: Sitting)   Pulse 64   Temp (!) 97.2 F (36.2 C) (Temporal)   Resp 16   Ht 5' (1.524 m)   Wt 162 lb (73.5 kg)   SpO2 100%   BMI 31.64 kg/m  BMI: Estimated body mass index  is 31.64 kg/m as calculated from the following:   Height as of this encounter: 5' (1.524 m).   Weight as of this encounter: 162 lb (73.5 kg). Ideal: Ideal body weight: 45.5 kg (100 lb 4.9 oz) Adjusted ideal body weight: 56.7 kg (124 lb 15.8 oz) General appearance: Well nourished, well developed, and well hydrated. In no apparent acute distress Mental status: Alert, oriented x 3 (person, place, & time)       Respiratory: No evidence of acute respiratory distress Eyes: PERLA  Lumbar Exam  Skin & Axial Inspection: No masses, redness, or swelling Alignment: Symmetrical Functional ROM: Decreased ROM affecting both sides Stability: No instability detected Muscle Tone/Strength: Functionally  intact. No obvious neuro-muscular anomalies detected. Sensory (Neurological): Musculoskeletal pain pattern Palpation: No palpable anomalies       Provocative Tests: Hyperextension/rotation test: (+) bilaterally for facet joint pain. Lumbar quadrant test (Kemp's test): (+) bilaterally for facet joint pain. Patrick's Maneuver: deferred today                   FABER* test: deferred today                   S-I anterior distraction/compression test: (+)   S-I arthralgia/arthropathy S-I lateral compression test: deferred today         S-I Thigh-thrust test: deferred today         S-I Gaenslen's test: deferred today         *(Flexion, ABduction and External Rotation)   Gait & Posture Assessment  Ambulation: Limited Gait: Antalgic Posture: Difficulty standing up straight, due to pain    Lower Extremity Exam      Side: Right lower extremity   Side: Left lower extremity  Stability: No instability observed           Stability: No instability observed          Skin & Extremity Inspection: Evidence of prior arthroplastic surgery   Skin & Extremity Inspection: Evidence of prior arthroplastic surgery  Functional ROM: Pain restricted ROM for all joints of the lower extremity           Functional ROM: Pain restricted ROM for all joints of the lower extremity          Muscle Tone/Strength: Functionally intact. No obvious neuro-muscular anomalies detected.   Muscle Tone/Strength: Functionally intact. No obvious neuro-muscular anomalies detected.  Sensory (Neurological): Musculoskeletal pain pattern         Sensory (Neurological): Musculoskeletal pain pattern        DTR: Patellar: deferred today Achilles: deferred today Plantar: deferred today   DTR: Patellar: deferred today Achilles: deferred today Plantar: deferred today  Palpation: No palpable anomalies   Palpation: No palpable anomalies      Assessment   Diagnosis Status  1. Lumbar facet arthropathy   2. Chronic pain syndrome   3. Chronic  radicular lumbar pain   4. Medication management   5. Status post bilateral hip replacements    Controlled Controlled Controlled   Updated Problems: Problem  Medication Management    Plan of Care  Problem-specific:  Assessment and Plan Discontinue Norco 5-325 mg Started on Percocet 5-325 mg every 8 hours as needed for pain for up to 30 days. Prescribing drug monitoring (PDMP) reviewed; findings consistent with the use of prescribed medication and no evidence of narcotic misuse or abuse.  Schedule follow-up in 30 days for medication management Emmy Blanch, NP.  Future Plan: Lumbar radiofrequency ablation (RFA) #1 with  Dr. Marcelino   Sabrina [name] has a current medication list which includes the following long-term medication(s): amlodipine , ezetimibe , ferrous sulfate  er, furosemide , hydralazine , labetalol , losartan , mirabegron  er, nortriptyline , pantoprazole , and ranolazine .  Pharmacotherapy (Medications Ordered): Meds ordered this encounter  Medications   oxyCODONE -acetaminophen  (PERCOCET) 5-325 MG tablet    Sig: Take 1 tablet by mouth every 8 (eight) hours as needed for severe pain (pain score 7-10). Must last 30 days.    Dispense:  90 tablet    Refill:  0    Chronic Pain: STOP Act (Not applicable) Fill 1 day early if closed on refill date. Avoid benzodiazepines within 8 hours of opioids   Orders:  No orders of the defined types were placed in this encounter.     Return for (VV) with Dr. Marcelino regarding Lumbar RFA .    Recent Visits Date Type Provider Dept  05/29/24 Office Visit Tilda Samudio K, NP [facility]  05/01/24 Office Visit Cassanda Walmer K, NP [facility]  Showing recent visits within past 90 days and meeting all other requirements Today's Visits Date Type Provider Dept  06/26/24 Office Visit Raynette Arras K, NP [facility]  Showing today's visits and meeting all other requirements Future Appointments Date Type Provider  Dept  07/10/24 Appointment Marcelino Nurse, MD [facility]  07/26/24 Appointment Melrose Kearse K, NP [facility]  Showing future appointments within next 90 days and meeting all other requirements  I discussed the assessment and treatment plan with the patient. The patient was provided an opportunity to ask questions and all were answered. The patient agreed with the plan and demonstrated an understanding of the instructions.  Patient advised to call back or seek an in-person evaluation if the symptoms or condition worsens.  Duration of encounter: 30 minutes.  Total time on encounter, as per AMA guidelines included both the face-to-face and non-face-to-face time personally spent by the physician and/or other qualified health care professional(s) on the day of the encounter (includes time in activities that require the physician or other qualified health care professional and does not include time in activities normally performed by clinical staff). Physician's time may include the following activities when performed: Preparing to see the patient (e.g., pre-charting review of records, searching for previously ordered imaging, lab work, and nerve conduction tests) Review of prior analgesic pharmacotherapies. Reviewing PMP Interpreting ordered tests (e.g., lab work, imaging, nerve conduction tests) Performing post-procedure evaluations, including interpretation of diagnostic procedures Obtaining and/or reviewing separately obtained history Performing a medically appropriate examination and/or evaluation Counseling and educating the patient/family/caregiver Ordering medications, tests, or procedures Referring and communicating with other health care professionals (when not separately reported) Documenting clinical information in the electronic or other health record Independently interpreting results (not separately reported) and communicating results to the patient/  family/caregiver Care coordination (not separately reported)  Note by: Tressy Kunzman K Maxx Calaway, NP (TTS and AI technology used. I apologize for any typographical errors that were not detected and corrected.) Date: 06/26/2024; Time: 3:53 PM

## 2024-06-28 DIAGNOSIS — M48061 Spinal stenosis, lumbar region without neurogenic claudication: Secondary | ICD-10-CM | POA: Diagnosis not present

## 2024-07-03 ENCOUNTER — Other Ambulatory Visit: Payer: Self-pay

## 2024-07-03 ENCOUNTER — Encounter: Payer: Self-pay | Admitting: Internal Medicine

## 2024-07-03 DIAGNOSIS — I10 Essential (primary) hypertension: Secondary | ICD-10-CM

## 2024-07-03 DIAGNOSIS — I701 Atherosclerosis of renal artery: Secondary | ICD-10-CM

## 2024-07-05 MED ORDER — LABETALOL HCL 300 MG PO TABS: ORAL_TABLET | ORAL | 1 refills | Status: DC

## 2024-07-10 ENCOUNTER — Ambulatory Visit
Attending: Student in an Organized Health Care Education/Training Program | Admitting: Student in an Organized Health Care Education/Training Program

## 2024-07-10 DIAGNOSIS — G894 Chronic pain syndrome: Secondary | ICD-10-CM | POA: Diagnosis not present

## 2024-07-10 DIAGNOSIS — M47816 Spondylosis without myelopathy or radiculopathy, lumbar region: Secondary | ICD-10-CM

## 2024-07-10 NOTE — Progress Notes (Signed)
 PROVIDER NOTE: Interpretation of information contained herein should be left to medically-trained personnel. Specific patient instructions are provided elsewhere under Patient Instructions section of medical record. This document was created in part using AI and STT-dictation technology, any transcriptional errors that may result from this process are unintentional.  Patient: Sabrina [name]  Service: E/M   PCP: [name], MD  DOB: 05-20-1941  DOS: 07/10/2024  Provider: Wallie Sherry, MD  MRN: [id number]  Delivery: Virtual Visit  Specialty: Interventional Pain Management  Type: Established Patient  Setting: Ambulatory outpatient facility  Specialty designation: 09  Referring Prov.: [name], MD  Location: Remote location       Virtual Encounter - Pain Management PROVIDER NOTE: Information contained herein reflects review and annotations entered in association with encounter. Interpretation of such information and data should be left to medically-trained personnel. Information provided to patient can be located elsewhere in the medical record under Patient Instructions. Document created using STT-dictation technology, any transcriptional errors that may result from process are unintentional.    Contact & Pharmacy Preferred: [phone] Home: [phone] (home) Mobile: [phone] (mobile) E-mail: [email or web address]  [facility] - [address] ([address]), [place] - [address] ([address]) [place] [address] Phone: [phone] Fax: [phone]   Pre-screening  Sabrina [name] offered in-person vs virtual encounter. She indicated preferring virtual for this encounter.   Reason COVID-19*  Social distancing based on CDC and AMA recommendations.   I contacted Sabrina [name] on 07/10/2024 via telephone.      I clearly identified myself as Wallie Sherry, MD. I verified that I was speaking with the correct person using two  identifiers (Name: Sabrina [name], and date of birth: 1940/11/07).  Consent I sought verbal advanced consent from Sabrina [name] for virtual visit interactions. I informed Sabrina [name] of possible security and privacy concerns, risks, and limitations associated with providing not-in-person medical evaluation and management services. I also informed Sabrina [name] of the availability of in-person appointments. Finally, I informed her that there would be a charge for the virtual visit and that she could be  personally, fully or partially, financially responsible for it. Ms. Haney expressed understanding and agreed to proceed.   Historic Elements   Sabrina [name] is a 83 y.o. year old, female patient evaluated today after our last contact on 02/14/2024. 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), Amputation of fifth toe, right, traumatic, subsequent encounter (HCC) (06/18/2019), Anemia of chronic kidney failure, Anxiety, Aortic stenosis (03/18/2020), CAD (coronary artery disease), Cardiac arrest [facility]), Carotid artery stenosis, Cataracts, bilateral, Cervical spondylosis without myelopathy, Chronic diastolic CHF (congestive heart failure), NYHA class 3 (HCC), Chronic kidney disease, stage III (moderate) (HCC), Chronic narcotic use (06/24/2014), Chronic pain syndrome, GERD (gastroesophageal reflux disease), History of 2019 novel coronavirus disease (COVID-19) (09/22/2021), Hyperlipidemia, Hypertension, Long term current use of antithrombotics/antiplatelets, Lumbar stenosis with neurogenic claudication, Mitral stenosis (11/16/2021), Osteoarthritis of hip, Postoperative wound infection (03/29/2022), Pulmonary hypertension (HCC) (12/12/2017), PVD (peripheral vascular disease) (HCC), Renal artery stenosis (HCC), 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 She also  has a  past surgical history that includes Laparoscopic cholecystectomy (Left, 10/26/1999); Tonsillectomy and adenoidectomy; Abdominal hysterectomy (1976); Total hip arthroplasty (Left, 2005); Carotid angioplasty (Left); Coronary angioplasty with stent (2000); Toe amputation (Right); Cystoscopy with stent placement (Bilateral); Total hip arthroplasty (Right, 2015); Coronary artery bypass graft (N/A, 03/29/2000); Carotid endarterectomy (Left,  09/09/2003); Renal artery angioplasty (Bilateral, 12/2013); Colonoscopy with propofol  (N/A, 08/16/2017); Esophagogastroduodenoscopy (egd) with propofol  (N/A, 08/16/2017); Esophagogastroduodenoscopy (egd) with propofol  (N/A, 06/29/2018); LEFT HEART CATH AND CORONARY ANGIOGRAPHY (N/A, 11/18/2021); Upper Extremity Angiography (Left, 11/23/2021); Lower Extremity Angiography (Right, 01/06/2022); LEFT HEART CATH AND CORS/GRAFTS ANGIOGRAPHY (Left, 11/19/2002); LEFT HEART CATH AND CORS/GRAFTS ANGIOGRAPHY (Left, 09/17/2003); Endarterectomy femoral (Bilateral, 03/17/2022); Insertion of iliac stent (Left, 03/17/2022); Groin debridement (Right, 03/30/2022); Endarterectomy femoral (Right, 03/30/2022); ABDOMINAL AORTOGRAM W/LOWER EXTREMITY (N/A, 10/06/2022); PERIPHERAL VASCULAR BALLOON ANGIOPLASTY (10/06/2022); ABDOMINAL AORTOGRAM W/LOWER EXTREMITY (N/A, 05/04/2023); ABDOMINAL AORTOGRAM W/LOWER EXTREMITY (N/A, 06/20/2024); and RENAL INTERVENTION (Bilateral, 06/20/2024). Sabrina [name] has a current medication list which includes the following prescription(s): amlodipine , aspirin , clopidogrel , ezetimibe , ferrous sulfate  er, furosemide , hydralazine , labetalol , losartan , methocarbamol , mirabegron  er, nortriptyline , oxycodone -acetaminophen , pantoprazole , and ranolazine . She  reports that she quit smoking about 25 years ago. Her smoking use included cigarettes. She has been exposed to tobacco smoke. She has never used smokeless tobacco. She reports that she does not drink alcohol and does not use drugs. Ms.  [name] is allergic to celexa  [citalopram ], dilaudid  [hydromorphone  hcl], effient [prasugrel], hydrochlorothiazide , liothyronine , nsaids, nubain [nalbuphine hcl], penicillins, and statins.  BMI: Estimated body mass index is 31.64 kg/m as calculated from the following:   Height as of 06/26/24: 5' (1.524 m).   Weight as of 06/26/24: 162 lb (73.5 kg). Last encounter: 02/14/2024. Last procedure: Visit date not found.  HPI  Today, she is being contacted for   Patient is complaining of increased axial low back pain related to lumbar facet arthropathy and lumbar spondylosis. She has completed 2 diagnostic lumbar facet medial branch nerve blocks on 04/09/2020 and 05/21/2020.  These both were positive.  She would like to move forward with the lumbar RFA. She is on Plavix  and I instructed her to stop for 7 days prior to her procedure  No results found for: CBDTHCR, KATHLYNE, D9THCCBX  Laboratory Chemistry Profile   Renal Lab Results  Component Value Date   BUN 18 06/20/2024   CREATININE 1.50 (H) 06/20/2024   LABCREA 86 03/06/2018   GFR 32.48 (L) 08/25/2023   GFRAA 50 (L) 03/10/2020   GFRNONAA 28 (L) 06/05/2024    Hepatic Lab Results  Component Value Date   AST 19 06/05/2024   ALT 13 06/05/2024   ALBUMIN  3.9 06/05/2024   ALKPHOS 86 06/05/2024   LIPASE 16.0 07/06/2017    Electrolytes Lab Results  Component Value Date   NA 139 06/20/2024   K 3.6 06/20/2024   CL 107 06/20/2024   CALCIUM  9.5 06/05/2024   MG 2.5 (H) 03/31/2022   PHOS 3.8 09/23/2022    Bone No results found for: VD25OH, VD125OH2TOT, CI6874NY7, CI7874NY7, 25OHVITD1, 25OHVITD2, 25OHVITD3, TESTOFREE, TESTOSTERONE  Inflammation (CRP: Acute Phase) (ESR: Chronic Phase) Lab Results  Component Value Date   CRP <1.0 02/17/2023   ESRSEDRATE 53 (H) 02/17/2023   LATICACIDVEN 1.0 03/30/2022         Note: Above Lab results reviewed.  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  Ultrasound-guided micropuncture access of the right common femoral  artery in retrograde fashion 2.  Aortogram 3.  Selective imaging of the left renal artery, selective imaging of the  right renal artery 4.  Drug-coated balloon angioplasty left renal artery 4 x 40 mm balloon 5.  Drug-coated balloon angioplasty of  the right renal artery 5 x 40 mm  balloon 6.  Dedicated imaging of the bilateral common, external iliac arteries 7.  Device assisted closure-Mynx 8.  Moderate sedation time 55 minutes, contrast volume 75 mL  Indications:   Patient is an 83 year old female with multiple endovascular inventions at  outside hospital whom I met several years ago.  I have performed bilateral renal balloon angioplasty, right EIA  angioplasty in 2023, followed by balloon angioplasty of the iliac arteries  for in-stent restenosis as well.  Imaging today demonstrated concern for left renal artery stenosis with  velocities greater than 400.  I was concerned that the in-stent restenosis  could lead to thrombosis of the stent.  After discussing the risks and  benefits of diagnostic angiography with possible intervention, Tijah  elected to proceed.  Findings:   Bilateral renal arteries with greater than 80% flow-limiting stenosis at  the mid stent Widely patent infrarenal abdominal aorta.  No flow-limiting stenosis  identified in the aortoiliac segments bilaterally.    Procedure:  The patient was identified in the holding area and taken to  room 8.  The patient was then placed supine on the table and prepped and  draped in the usual sterile fashion.  A time out was called.  Ultrasound  was used to evaluate the right common femoral artery.  It was patent .  A  digital ultrasound image  was acquired.  A micropuncture needle was used to  access the right common femoral artery under ultrasound guidance.  An 018  wire was advanced without resistance and a micropuncture sheath was  placed.  The 018 wire was removed and a benson wire was placed.  The  micropuncture sheath was exchanged for a 5 french sheath.  An omniflush  catheter was advanced over the wire to the level of T12, and abdominal  angiogram followed.  See results above.  I elected to intervene on the  bilateral renal arteries.  A short 6 French sheath was placed, and the  patient heparinized.  Next, a 6 [place] IM guide catheter was brought onto  the field and positioned at the level of the renal arteries.  I began with  the left renal artery.  The IM catheter was placed in the stent and  diagnostic angiogram followed.  This was cannulated using a 0.014 wire  followed by 4 x 40 mm drug-coated balloon.  We did not have a smaller size  in regards to length.  This was inflated for 2 minutes with follow-up  angiography demonstrating excellent result.  There was no residual  flow-limiting stenosis.  Next, the wire was removed, and using the IM  catheter, I was able to cannulate the right renal artery.  Diagnostic  angiogram followed, and in similar fashion, a 5 x 40 mm balloon was  brought onto the field, laid across the stents, and inflated.  Follow-up  angiography demonstrated excellent result with resolution of flow-limiting  stenosis.  Angiography of the infrarenal aorta and iliacs followed.  See results  above.  No intervention was necessary.  Patient was closed using a minx device without issue.  Impression: Successful bilateral drug-coated balloon angioplasty of the  renal arteries for in-stent restenosis.  Fonda FORBES Rim MD [facility] Office: [phone]  Narrative & Impression  CLINICAL DATA:  Low back pain. Symptoms persist with greater than 6 weeks of treatment.  Right worse than left leg pain.   EXAM: MRI LUMBAR SPINE WITHOUT CONTRAST  TECHNIQUE: Multiplanar, multisequence MR imaging of the lumbar spine was performed. No intravenous contrast was administered.   COMPARISON:  07/19/2019   FINDINGS: Segmentation: 5 lumbar type vertebral bodies as numbered previously.   Alignment: Chronic degenerative anterolisthesis at L4-5 of 4-5 mm due to chronic facet arthropathy.   Vertebrae: No vertebral body fracture or focal bone lesion. There are some endplate edematous changes on the right at L5-S1 that could relate to low back pain, worsened since 2020.   Conus medullaris and cauda equina: Conus extends to the L1 level. Conus and cauda equina appear normal.   Paraspinal and other soft tissues: Ectasia of the infrarenal abdominal aorta with maximal diameter of 2.8 cm. No frank aneurysm.   Disc levels:   No significant finding from T11-12 through L2-3.   L3-4: Mild bulging of the disc. Mild facet and ligamentous hypertrophy. No compressive stenosis. No change.   L4-5: Chronic facet osteoarthritis with degenerative anterolisthesis of 4-5 mm. Some worsening of edema of the facet joints with compared to the study of 2020, suggesting that these could be painful joints and could result in back pain or referred facet syndrome pain. There is mild stenosis of both lateral recesses but without definite focal neural compression.   L5-S1: Bulging of the disc more prominent towards the right. Minimal facet degeneration. No compressive narrowing of the canal or foramina. As noted above, some worsening degenerative endplate edema on the right could relate to low back pain.   IMPRESSION: 1. L4-5: Chronic facet arthropathy with 4-5 mm of anterolisthesis. Some worsening of edema of the facet joints when compared to the study of 2020, suggesting that these could be painful joints and could result in back pain or referred facet syndrome pain. There  is mild stenosis of both lateral recesses but without definite focal neural compression. 2. L5-S1: Disc bulge more prominent towards the right. No compressive stenosis. Some worsening of degenerative endplate edema on the right that could relate to low back pain. 3. Ectasia of the infrarenal abdominal aorta with maximal diameter of 2.8 cm. No follow-up recommended.      Assessment  The primary encounter diagnosis was Lumbar facet arthropathy. Diagnoses of Lumbar spondylosis and Chronic pain syndrome were also pertinent to this visit.  Plan of Care  The patient has undergone two positive diagnostic lumbar facet medial branch nerve blocks bilaterally at L3,L4, L5, each providing >=80% pain relief for the expected duration of action.  These were done bilaterally at L3, L4, L5 on 04/09/2020 and 05/21/2020.. Given the reproducible and significant pain relief with these targeted blocks, we discussed proceeding with lumbar radiofrequency ablation (RFA) of the medial branch nerves to achieve longer-term pain reduction and functional improvement. The patient was counseled on the procedure, expected outcomes, and potential risks, and agrees with the plan.  1. Lumbar facet arthropathy (Primary) - Radiofrequency,Lumbar; Future  2. Lumbar spondylosis - Radiofrequency,Lumbar; Future  3. Chronic pain syndrome - Radiofrequency,Lumbar; Future     Follow-up plan:   Return in about 13 days (around 07/23/2024) for B/L L3, 4, 5 RFA, ECT.      Recent Visits Date Type Provider Dept  06/26/24 Office Visit Patel, Seema K, NP [facility]  05/29/24 Office Visit Patel, Seema K, NP [facility]  05/01/24 Office Visit Patel, Seema K, NP [facility]  Showing recent visits within past 90 days and meeting all other requirements Today's Visits Date Type Provider Dept  07/10/24 Office Visit Marcelino Nurse, MD [facility]  Showing today's visits and meeting all other  requirements Future Appointments Date Type Provider Dept  07/26/24 Appointment Patel, Seema K, NP [facility]  Showing future appointments within next 90 days and meeting all other requirements  I discussed the assessment and treatment plan with the patient. The patient was provided an opportunity to ask questions and all were answered. The patient agreed with the plan and demonstrated an understanding of the instructions.  Patient advised to call back or seek an in-person evaluation if the symptoms or condition worsens.  Duration of encounter: .  Note by: Wallie Sherry, MD Date: 07/10/2024; Time: 2:28 PM

## 2024-07-11 ENCOUNTER — Telehealth: Payer: Self-pay

## 2024-07-11 NOTE — Telephone Encounter (Signed)
 There is a possiblity that they will deny the auth for this since it has been four years since her facet blocks. I have submitted it and I will let you know.

## 2024-07-12 ENCOUNTER — Telehealth: Payer: Self-pay | Admitting: Student in an Organized Health Care Education/Training Program

## 2024-07-12 ENCOUNTER — Telehealth: Payer: Self-pay | Admitting: *Deleted

## 2024-07-12 NOTE — Telephone Encounter (Signed)
 Returned patient phone call;  she was asking about PA for procedure.  I told her that when PA is received front end staff will call her to schedule appt time or let her know of rejection.

## 2024-07-12 NOTE — Telephone Encounter (Signed)
 PT will like for a nurse to give her a call. PT left voicemail that she has a question to call. TY

## 2024-07-14 ENCOUNTER — Encounter: Payer: Self-pay | Admitting: Internal Medicine

## 2024-07-16 ENCOUNTER — Telehealth: Payer: Self-pay

## 2024-07-16 NOTE — Telephone Encounter (Signed)
 She is very nervous about the procedure and wants to know if Dr. Marcelino  is aware that she has heart issues and that she coded when she had surgery before.

## 2024-07-18 DIAGNOSIS — M79674 Pain in right toe(s): Secondary | ICD-10-CM | POA: Diagnosis not present

## 2024-07-18 DIAGNOSIS — L600 Ingrowing nail: Secondary | ICD-10-CM | POA: Diagnosis not present

## 2024-07-18 DIAGNOSIS — M79675 Pain in left toe(s): Secondary | ICD-10-CM | POA: Diagnosis not present

## 2024-07-18 DIAGNOSIS — B351 Tinea unguium: Secondary | ICD-10-CM | POA: Diagnosis not present

## 2024-07-18 NOTE — Progress Notes (Signed)
 CC: No chief complaint on file.   Sabrina [name] is a 83 y.o. with a complaint of thick painful toenails and all of her toes causing some pain and pressure in her shoes when walking.  Does get regular pedicures.  Denies any bleeding or drainage.   Pt has tried the following treatment(s): As mentioned above.     PMH: Past Medical History:  Diagnosis Date  . Anxiety   . Blockage of coronary artery of heart (CMS/HHS-HCC)   . CAD (coronary artery disease)   . Cataracts, bilateral   . HTN (hypertension)   . Hyperlipidemia     Medication: Current Outpatient Medications on File Prior to Visit  Medication Sig Dispense Refill  . acetaminophen -codeine  (TYLENOL  #4) 300-60 mg per tablet     . amLODIPine  (NORVASC ) 10 MG tablet Take 10 mg by mouth at bedtime    . aspirin  81 MG EC tablet Take 81 mg by mouth once daily.    . clopidogrel  (PLAVIX ) 75 mg tablet Take 75 mg by mouth once daily.    . DULoxetine  (CYMBALTA ) 20 MG DR capsule Take 40 mg by mouth once daily    . ezetimibe  (ZETIA ) 10 mg tablet Take 10 mg by mouth once daily    . ferrous sulfate  325 (65 FE) MG EC tablet Take 325 mg by mouth daily with breakfast    . FUROsemide  (LASIX ) 20 MG tablet Take 20 mg by mouth once daily    . hydrALAZINE  (APRESOLINE ) 50 MG tablet Take 1 tablet (50 mg total) by mouth 3 (three) times daily. (Patient taking differently: Take 50 mg by mouth once daily as needed) 90 tablet 5  . labetaloL  (TRANDATE ) 300 MG tablet Take 300 mg by mouth 2 (two) times daily    . losartan  (COZAAR ) 50 MG tablet Take 50 mg by mouth once daily    . pantoprazole  (PROTONIX ) 40 MG DR tablet Take 40 mg by mouth once daily  3  . ranolazine  (RANEXA ) 500 MG ER tablet Take 1 tablet (500 mg total) by mouth 2 (two) times daily 90 tablet 1  . vibegron  75 mg Tab Take by mouth once daily    . albuterol  90 mcg/actuation inhaler Inhale 2 inhalations into the lungs every 6 (six) hours as needed for Wheezing (Patient not taking: Reported  on 07/28/2023) 1 each 2  . nortriptyline  (PAMELOR ) 10 MG capsule Take 2 capsules (20 mg total) by mouth nightly (Patient taking differently: Take 10 mg by mouth 2 (two) times daily) 180 capsule 3   No current facility-administered medications on file prior to visit.    Allergies: Allergies as of 07/18/2024 - Reviewed 07/18/2024  Allergen Reaction Noted  . Citalopram  Anaphylaxis 05/22/2015  . Dilaudid  [hydromorphone ] Rash 04/04/2014  . Effient [prasugrel] Itching 01/31/2014  . Hydrochlorothiazide  Unknown 12/24/2014  . Nalbuphine Rash 01/31/2014  . Nsaids (non-steroidal anti-inflammatory drug) Kidney Disorder 07/05/2014  . Penicillin g Rash 01/31/2014  . Penicillins Hives   . Statins-hmg-coa reductase inhibitors Rash 04/04/2014    Surgical History: Past Surgical History:  Procedure Laterality Date  . TRANSCATH PLACEMENT INTRAVASCULAR STENT CHEST  2000  . REPLACEMENT TOTAL HIP W/  RESURFACING IMPLANTS Left 2005  . Stent in Kidney  01/14/14  . REPLACEMENT TOTAL HIP W/  RESURFACING IMPLANTS Right 04/10/2014  . Amputation of the pinky toe on the right foot    . CHOLECYSTECTOMY    . HYSTERECTOMY     Partial  . TONSILLECTOMY      Social History:  Social History   Socioeconomic History  . Marital status: Divorced  . Number of children: 3  . Years of education: 65  Occupational History  . Occupation: Retired  Tobacco Use  . Smoking status: Never  . Smokeless tobacco: Never  Vaping Use  . Vaping status: Never Used  Substance and Sexual Activity  . Alcohol use: No    Alcohol/week: 0.0 standard drinks of alcohol  . Drug use: No  . Sexual activity: Defer   Social Drivers of Health   Financial Resource Strain: Low Risk  (07/18/2024)   Overall Financial Resource Strain (CARDIA)   . Difficulty of Paying Living Expenses: Not hard at all  Food Insecurity: No Food Insecurity (07/18/2024)   Hunger Vital Sign   . Worried About Programme researcher, broadcasting/film/video in the Last Year: Never true   .  Ran Out of Food in the Last Year: Never true  Transportation Needs: No Transportation Needs (07/18/2024)   PRAPARE - Transportation   . Lack of Transportation (Medical): No   . Lack of Transportation (Non-Medical): No  Physical Activity: Inactive (05/14/2024)   Received from [facility]   Exercise Vital Sign   . On average, how many days per week do you engage in moderate to strenuous exercise (like a brisk walk)?: 1 day   . On average, how many minutes do you engage in exercise at this level?: 0 min  Stress: No Stress Concern Present (05/14/2024)   Received from [facility] - Occupational Stress Questionnaire   . Do you feel stress - tense, restless, nervous, or anxious, or unable to sleep at night because your mind is troubled all the time - these days?: Not at all  Social Connections: Moderately Integrated (05/14/2024)   Received from [facility]   Social Connection and Isolation Panel   . In a typical week, how many times do you talk on the phone with family, friends, or neighbors?: More than three times a week   . How often do you get together with friends or relatives?: More than three times a week   . How often do you attend church or religious services?: More than 4 times per year   . Do you belong to any clubs or organizations such as church groups, unions, fraternal or athletic groups, or school groups?: Yes   . How often do you attend meetings of the clubs or organizations you belong to?: More than 4 times per year   . Are you married, widowed, divorced, separated, never married, or living with a partner?: Divorced  Housing Stability: Low Risk  (07/18/2024)   Housing Stability Vital Sign   . Unable to Pay for Housing in the Last Year: No   . Number of Times Moved in the Last Year: 0   . Homeless in the Last Year: No   Social History   Tobacco Use  Smoking Status Never  Smokeless Tobacco Never      Review of Systems:  A  comprehensive review of systems is documented elsewehere in the encounter.   Review of Systems : Review of systems is documented in this chart under nurses   Objective: Constitutional: General appearance is well with no acute distress.  Normal mood and affect. Vascular:Left foot:  Dorsalis Pedis:  present Posterior Tibial:  present      Right foot:  Dorsalis Pedis:  present Posterior Tibial:  present  Neuro:  Epicritic sensations grossly intact  Derm:  The skin is warm dry and supple.  No focal erythema, edema, or ecchymosis .  All 10 toenails are thick, dystrophic, discolored, brittle with subungual debris.  Most all of the toenails are also significantly highly incurvated and ingrown into the borders, especially on the hallux bilateral.  No signs of any drainage.  Ortho/MS:Painfree ROM to ankle, subtalar, midtarsal, and metatarsalphalangeal joints.  Full muscle strength to all major muscle groups of the lower extremity  Xrays None  Assessment: Encounter Diagnoses  Name Primary?  . Mycotic toenails Yes  . Pain in toes of both feet   . Ingrowing nail     Plan: Debridement of all 10 toenails in length and thickness sharply using toenail nippers.  Patient will return to clinic as needed.  No orders of the defined types were placed in this encounter.   Return if symptoms worsen or fail to improve.

## 2024-07-23 ENCOUNTER — Encounter: Payer: Self-pay | Admitting: Internal Medicine

## 2024-07-26 ENCOUNTER — Ambulatory Visit: Attending: Nurse Practitioner | Admitting: Nurse Practitioner

## 2024-07-26 ENCOUNTER — Encounter: Payer: Self-pay | Admitting: Nurse Practitioner

## 2024-07-26 VITALS — BP 141/53 | HR 63 | Temp 97.5°F | Resp 18 | Ht 60.0 in | Wt 163.0 lb

## 2024-07-26 DIAGNOSIS — M47816 Spondylosis without myelopathy or radiculopathy, lumbar region: Secondary | ICD-10-CM | POA: Insufficient documentation

## 2024-07-26 DIAGNOSIS — G894 Chronic pain syndrome: Secondary | ICD-10-CM | POA: Insufficient documentation

## 2024-07-26 DIAGNOSIS — Z79899 Other long term (current) drug therapy: Secondary | ICD-10-CM | POA: Insufficient documentation

## 2024-07-26 MED ORDER — OXYCODONE-ACETAMINOPHEN 5-325 MG PO TABS: 1.0000 | ORAL_TABLET | Freq: Three times a day (TID) | ORAL | 0 refills | Status: AC | PRN

## 2024-07-26 NOTE — Progress Notes (Signed)
 PROVIDER NOTE: Interpretation of information contained herein should be left to medically-trained personnel. Specific patient instructions are provided elsewhere under Patient Instructions section of medical record. This document was created in part using AI and STT-dictation technology, any transcriptional errors that may result from this process are unintentional.  Patient: Sabrina [name]  Service: E/M   PCP: Sabrina Verneita CROME, MD  DOB: 01-Jun-1941  DOS: 07/26/2024  Provider: Emmy MARLA Blanch, NP  MRN: [id number]  Delivery: Face-to-face  Specialty: Interventional Pain Management  Type: Established Patient  Setting: Ambulatory outpatient facility  Specialty designation: 09  Referring Prov.: Sabrina Verneita CROME, MD  Location: Outpatient office facility       History of present illness (HPI) [name], a 83 y.o. year old female, is here today because of her Medication management [Z79.899]. Ms. Sem primary complain today is Back Pain (Lower Back Pain )  Pertinent problems: Sabrina [name] has Hip pain, bilateral; Peripheral vascular disease (HCC); Generalized anxiety disorder; Chronic right hip pain; Cervical spondylosis without myelopathy; Chronic radicular lumbar pain; and Chronic pain syndrome on their pertinent problem list  Pain Assessment: Severity of Chronic pain is reported as a 10-Worst pain ever/10. Location: Back Lower/Denies. Onset: More than a month ago. Quality: Aching. Timing: Constant. Modifying factor(s): Sitting Down. Vitals:  height is 5' (1.524 m) and weight is 163 lb (73.9 kg). Her temporal temperature is 97.5 F (36.4 C) (abnormal). Her blood pressure is 141/53 (abnormal) and her pulse is 63. Her respiration is 18 and oxygen saturation is 98%.  BMI: Estimated body mass index is 31.83 kg/m as calculated from the following:   Height as of this encounter: 5' (1.524 m).   Weight as of this encounter: 163 lb (73.9 kg).  Last encounter: 06/26/2024. Last procedure: Visit  date not found.  Reason for encounter: medication management.  No change in medical history since last visit.  Patient's pain is at baseline.  Patient continues multimodal pain regimen as prescribed.  States that it provides pain relief and improvement in functional status.  Pharmacotherapy Assessment   Hydrocodone -acetaminophen  5-325 mg  every 8 hours as needed for pain. MME=15 (Discontinue 06/26/2024) Oxycodone -acetaminophen  (Percocet) 5-325 mg every 8 hours as needed for pain. (Started on 06/26/2024) Monitoring:  PMP: PDMP reviewed during this encounter.       Pharmacotherapy: No side-effects or adverse reactions reported. Compliance: No problems identified. Effectiveness: Clinically acceptable.  Sabrina [name], [place]  07/26/2024  2:06 PM  Sign when Signing Visit Nursing Pain Medication Assessment:  Safety precautions to be maintained throughout the outpatient stay will include: orient to surroundings, keep bed in low position, maintain call bell within reach at all times, provide assistance with transfer out of bed and ambulation.  Medication Inspection Compliance: Pill count conducted under aseptic conditions, in front of the patient. Neither the pills nor the bottle was removed from the patient's sight at any time. Once count was completed pills were immediately returned to the patient in their original bottle.  Medication: Oxycodone /APAP Pill/Patch Count: 88 of 90 pills/patches remain Pill/Patch Appearance: Markings consistent with prescribed medication Bottle Appearance: Standard pharmacy container. Clearly labeled. Filled Date: 09 / 02 / 2025 Last Medication intake:  Today    UDS:  Summary  Date Value Ref Range Status  05/01/2024 FINAL  Final    Comment:    ==================================================================== [employer] [facility] (MW) ==================================================================== Test  Result       Flag        Units  Drug Present not Declared for Prescription Verification   Buprenorphine                   11           UNEXPECTED ng/mg creat   Norbuprenorphine               >1852        UNEXPECTED ng/mg creat    Source of buprenorphine  is a scheduled prescription medication.    Norbuprenorphine is an expected metabolite of buprenorphine .  Drug Absent but Declared for Prescription Verification   Hydrocodone                     Not Detected UNEXPECTED ng/mg creat ==================================================================== Test                      Result    Flag   Units      Ref Range   Creatinine              54               mg/dL      >=79 ==================================================================== Declared Medications:  The flagging and interpretation on this report are based on the  following declared medications.  Unexpected results may arise from  inaccuracies in the declared medications.   **Note: The testing scope of this panel includes these medications:   Hydrocodone  (Norco)   **Note: The testing scope of this panel does not include the  following reported medications:   Acetaminophen  (Norco)  Amlodipine  (Norvasc )  Aspirin   Clopidogrel  (Plavix )  Duloxetine  (Cymbalta )  Ezetimibe  (Zetia )  Furosemide  (Lasix )  Hydralazine  (Apresoline )  Hydroxyzine  (Vistaril )  Iron  Normodyne   Topical  Vitamin C  ==================================================================== For clinical consultation, please call [phone]. ====================================================================     No results found for: CBDTHCR No results found for: D8THCCBX No results found for: D9THCCBX  ROS  Constitutional: Denies any fever or chills Gastrointestinal: No reported hemesis, hematochezia, vomiting, or acute GI distress Musculoskeletal: Lower back pain Neurological: No reported episodes of acute onset apraxia, aphasia, dysarthria, agnosia, amnesia,  paralysis, loss of coordination, or loss of consciousness  Medication Review  amLODipine , aspirin , clopidogrel , ezetimibe , ferrous sulfate  ER, furosemide , hydrALAZINE , labetalol , losartan , methocarbamol , mirabegron  ER, nortriptyline , oxyCODONE -acetaminophen , pantoprazole , and ranolazine   History Review  Allergy: Sabrina [name] is allergic to celexa  [citalopram ], dilaudid  [hydromorphone  hcl], effient [prasugrel], hydrochlorothiazide , liothyronine , nsaids, nubain [nalbuphine hcl], penicillins, and statins. Drug: Sabrina [name]  reports no history of drug use. Alcohol:  reports no history of alcohol use. Tobacco:  reports that she quit smoking about 25 years ago. Her smoking use included cigarettes. She has been exposed to tobacco smoke. She has never used smokeless tobacco. Social: Sabrina [name]  reports that she quit smoking about 25 years ago. Her smoking use included cigarettes. She has been exposed to tobacco smoke. She has never used smokeless tobacco. She reports that she does not drink alcohol and does not use drugs. 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), Amputation of fifth toe, right, traumatic, subsequent encounter (06/18/2019), Anemia of chronic kidney failure, Anxiety, Aortic stenosis (03/18/2020), CAD (coronary artery disease), Cardiac arrest [facility]), Carotid artery stenosis, Cataracts, bilateral, Cervical spondylosis without myelopathy, Chronic diastolic CHF (congestive heart failure), NYHA class 3 (HCC), Chronic kidney disease, stage III (moderate) (HCC), Chronic narcotic use (06/24/2014), Chronic pain syndrome,  GERD (gastroesophageal reflux disease), History of 2019 novel coronavirus disease (COVID-19) (09/22/2021), Hyperlipidemia, Hypertension, Long term current use of antithrombotics/antiplatelets, Lumbar stenosis with neurogenic claudication, Mitral stenosis (11/16/2021), Osteoarthritis of hip, Postoperative wound infection (03/29/2022), Pulmonary  hypertension (HCC) (12/12/2017), PVD (peripheral vascular disease), Renal artery stenosis, 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 Surgical: Sabrina [name]  has a past surgical history that includes Laparoscopic cholecystectomy (Left, 10/26/1999); Tonsillectomy and adenoidectomy; Abdominal hysterectomy (1976); Total hip arthroplasty (Left, 2005); Carotid angioplasty (Left); Coronary angioplasty with stent (2000); Toe amputation (Right); Cystoscopy with stent placement (Bilateral); Total hip arthroplasty (Right, 2015); Coronary artery bypass graft (N/A, 03/29/2000); Carotid endarterectomy (Left, 09/09/2003); Renal artery angioplasty (Bilateral, 12/2013); Colonoscopy with propofol  (N/A, 08/16/2017); Esophagogastroduodenoscopy (egd) with propofol  (N/A, 08/16/2017); Esophagogastroduodenoscopy (egd) with propofol  (N/A, 06/29/2018); LEFT HEART CATH AND CORONARY ANGIOGRAPHY (N/A, 11/18/2021); Upper Extremity Angiography (Left, 11/23/2021); Lower Extremity Angiography (Right, 01/06/2022); LEFT HEART CATH AND CORS/GRAFTS ANGIOGRAPHY (Left, 11/19/2002); LEFT HEART CATH AND CORS/GRAFTS ANGIOGRAPHY (Left, 09/17/2003); Endarterectomy femoral (Bilateral, 03/17/2022); Insertion of iliac stent (Left, 03/17/2022); Groin debridement (Right, 03/30/2022); Endarterectomy femoral (Right, 03/30/2022); ABDOMINAL AORTOGRAM W/LOWER EXTREMITY (N/A, 10/06/2022); PERIPHERAL VASCULAR BALLOON ANGIOPLASTY (10/06/2022); ABDOMINAL AORTOGRAM W/LOWER EXTREMITY (N/A, 05/04/2023); ABDOMINAL AORTOGRAM W/LOWER EXTREMITY (N/A, 06/20/2024); and RENAL INTERVENTION (Bilateral, 06/20/2024). Family: family history includes Breast cancer (age of onset: 14) in her paternal aunt; Cerebral aneurysm in her son and son; Diabetes in her mother; Heart disease in her sister; Hypertension in her father and mother; Multiple sclerosis in her daughter and son; Seizures in her son; Stroke in her  mother.  Laboratory Chemistry Profile   Renal Lab Results  Component Value Date   BUN 18 06/20/2024   CREATININE 1.50 (H) 06/20/2024   LABCREA 86 03/06/2018   GFR 32.48 (L) 08/25/2023   GFRAA 50 (L) 03/10/2020   GFRNONAA 28 (L) 06/05/2024    Hepatic Lab Results  Component Value Date   AST 19 06/05/2024   ALT 13 06/05/2024   ALBUMIN  3.9 06/05/2024   ALKPHOS 86 06/05/2024   LIPASE 16.0 07/06/2017    Electrolytes Lab Results  Component Value Date   NA 139 06/20/2024   K 3.6 06/20/2024   CL 107 06/20/2024   CALCIUM  9.5 06/05/2024   MG 2.5 (H) 03/31/2022   PHOS 3.8 09/23/2022    Bone No results found for: VD25OH, VD125OH2TOT, CI6874NY7, CI7874NY7, 25OHVITD1, 25OHVITD2, 25OHVITD3, TESTOFREE, TESTOSTERONE  Inflammation (CRP: Acute Phase) (ESR: Chronic Phase) Lab Results  Component Value Date   CRP <1.0 02/17/2023   ESRSEDRATE 53 (H) 02/17/2023   LATICACIDVEN 1.0 03/30/2022         Note: Above Lab results reviewed.  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  Ultrasound-guided micropuncture access of the right common femoral  artery in retrograde fashion 2.  Aortogram 3.  Selective imaging of the left renal artery, selective imaging of the  right renal artery 4.  Drug-coated balloon angioplasty left renal artery 4 x 40 mm balloon 5.  Drug-coated balloon angioplasty of the right renal artery 5 x 40 mm  balloon 6.  Dedicated imaging of the bilateral common, external iliac arteries 7.  Device assisted closure-Mynx 8.  Moderate sedation time 55 minutes, contrast volume 75 mL  Indications:   Patient is an 82 year old female with multiple endovascular inventions  at  outside hospital whom I met  several years ago.  I have performed bilateral renal balloon angioplasty, right EIA  angioplasty in 2023, followed by balloon angioplasty of the iliac arteries  for in-stent restenosis as well.  Imaging today demonstrated concern for left renal artery stenosis with  velocities greater than 400.  I was concerned that the in-stent restenosis  could lead to thrombosis of the stent.  After discussing the risks and  benefits of diagnostic angiography with possible intervention, Sabrina [name]  elected to proceed.  Findings:   Bilateral renal arteries with greater than 80% flow-limiting stenosis at  the mid stent Widely patent infrarenal abdominal aorta.  No flow-limiting stenosis  identified in the aortoiliac segments bilaterally.    Procedure:  The patient was identified in the holding area and taken to  room 8.  The patient was then placed supine on the table and prepped and  draped in the usual sterile fashion.  A time out was called.  Ultrasound  was used to evaluate the right common femoral artery.  It was patent .  A  digital ultrasound image was acquired.  A micropuncture needle was used to  access the right common femoral artery under ultrasound guidance.  An 018  [name] was advanced without resistance and a micropuncture sheath was  placed.  The 018 [name] was removed and a benson [name] was placed.  The  micropuncture sheath was exchanged for a 5 french sheath.  An omniflush  catheter was advanced over the [name] to the level of T12, and abdominal  angiogram followed.  See results above.  I elected to intervene on the  bilateral renal arteries.  A short 6 French sheath was placed, and the  patient heparinized.  Next, a 6 [place] IM guide catheter was brought onto  the field and positioned at the level of the renal arteries.  I began with  the left renal artery.  The IM catheter was placed in the stent and  diagnostic angiogram followed.  This was cannulated using a 0.014 [name]  followed by 4 x  40 mm drug-coated balloon.  We did not have a smaller size  in regards to length.  This was inflated for 2 minutes with follow-up  angiography demonstrating excellent result.  There was no residual  flow-limiting stenosis.  Next, the [name] was removed, and using the IM  catheter, I was able to cannulate the right renal artery.  Diagnostic  angiogram followed, and in similar fashion, a 5 x 40 mm balloon was  brought onto the field, laid across the stents, and inflated.  Follow-up  angiography demonstrated excellent result with resolution of flow-limiting  stenosis.  Angiography of the infrarenal aorta and iliacs followed.  See results  above.  No intervention was necessary.  Patient was closed using a minx device without issue.  Impression: Successful bilateral drug-coated balloon angioplasty of the  renal arteries for in-stent restenosis.  Fonda FORBES Rim MD [facility] Office: [phone] Note: Reviewed        Physical Exam  Vitals: BP (!) 141/53 (BP Location: Left Arm, Patient Position: Sitting)   Pulse 63   Temp (!) 97.5 F (36.4 C) (Temporal)   Resp 18   Ht 5' (1.524 m)   Wt 163 lb (73.9 kg)   SpO2 98%   BMI 31.83 kg/m  BMI: Estimated body mass index is 31.83 kg/m as calculated from the following:   Height as of this encounter: 5' (  1.524 m).   Weight as of this encounter: 163 lb (73.9 kg). Ideal: Ideal body weight: 45.5 kg (100 lb 4.9 oz) Adjusted ideal body weight: 56.9 kg (125 lb 6.2 oz) General appearance: Well nourished, well developed, and well hydrated. In no apparent acute distress Mental status: Alert, oriented x 3 (person, place, & time)       Respiratory: No evidence of acute respiratory distress Eyes: PERLA  Musculoskeletal: + LBP Assessment   Diagnosis Status  1. Medication management   2. Chronic pain syndrome   3. Lumbar facet arthropathy   4. Lumbar spondylosis    Controlled Controlled Controlled   Updated  Problems: No problems updated.  Plan of Care  Problem-specific:  Assessment and Plan Medication management: Patient's pain is well-controlled with Percocet 5-325 mg, will continue on current medication regimen.  Prescribing drug monitoring (PDMP) reviewed; findings consistent with the use of prescribed medication and no evidence of narcotic misuse or abuse.  Urine drug screening (UDS) up-to-date.  Schedule follow-up in 30 days for medication management.   Chronic pain syndrome: Patient continues experiencing low back pain.  The patient has undergone multiple interventional therapies in the past.  She is scheduled for a lumbar radiofrequency ablation (RFA) with Dr. Marcelino  Ms. Zyanne A Minetti has a current medication list which includes the following long-term medication(s): amlodipine , ezetimibe , ferrous sulfate  er, furosemide , hydralazine , labetalol , losartan , mirabegron  er, nortriptyline , pantoprazole , and ranolazine .  Pharmacotherapy (Medications Ordered): Meds ordered this encounter  Medications   oxyCODONE -acetaminophen  (PERCOCET) 5-325 MG tablet    Sig: Take 1 tablet by mouth every 8 (eight) hours as needed for severe pain (pain score 7-10). Must last 30 days.    Dispense:  90 tablet    Refill:  0    Chronic Pain: STOP Act (Not applicable) Fill 1 day early if closed on refill date. Avoid benzodiazepines within 8 hours of opioids   Orders:  No orders of the defined types were placed in this encounter.       Return in about 1 month (around 08/26/2024) for (F2F), (MM), Sabrina Blanch NP.    Recent Visits Date Type Provider Dept  07/10/24 Office Visit Marcelino Nurse, MD [facility]  06/26/24 Office Visit Meriah Shands K, NP [facility]  05/29/24 Office Visit Rashaunda Rahl K, NP [facility]  05/01/24 Office Visit Pal Shell K, NP [facility]  Showing recent visits within past 90 days and meeting all other requirements Today's Visits Date  Type Provider Dept  07/26/24 Office Visit Bradely Rudin K, NP [facility]  Showing today's visits and meeting all other requirements Future Appointments Date Type Provider Dept  08/08/24 Appointment Marcelino Nurse, MD [facility]  08/23/24 Appointment Jeylin Woodmansee K, NP [facility]  Showing future appointments within next 90 days and meeting all other requirements  I discussed the assessment and treatment plan with the patient. The patient was provided an opportunity to ask questions and all were answered. The patient agreed with the plan and demonstrated an understanding of the instructions.  Patient advised to call back or seek an in-person evaluation if the symptoms or condition worsens.  Duration of encounter: 30 minutes.  Total time on encounter, as per AMA guidelines included both the face-to-face and non-face-to-face time personally spent by the physician and/or other qualified health care professional(s) on the day of the encounter (includes time in activities that require the physician or other qualified health care professional and does not include  time in activities normally performed by clinical staff). Physician's time may include the following activities when performed: Preparing to see the patient (e.g., pre-charting review of records, searching for previously ordered imaging, lab work, and nerve conduction tests) Review of prior analgesic pharmacotherapies. Reviewing PMP Interpreting ordered tests (e.g., lab work, imaging, nerve conduction tests) Performing post-procedure evaluations, including interpretation of diagnostic procedures Obtaining and/or reviewing separately obtained history Performing a medically appropriate examination and/or evaluation Counseling and educating the patient/family/caregiver Ordering medications, tests, or procedures Referring and communicating with other health care professionals (when not separately  reported) Documenting clinical information in the electronic or other health record Independently interpreting results (not separately reported) and communicating results to the patient/ family/caregiver Care coordination (not separately reported)  Note by: Sabrina MARLA Blanch, NP  Date: 07/26/2024; Time: 3:28 PM

## 2024-07-26 NOTE — Progress Notes (Signed)
 Nursing Pain Medication Assessment:  Safety precautions to be maintained throughout the outpatient stay will include: orient to surroundings, keep bed in low position, maintain call bell within reach at all times, provide assistance with transfer out of bed and ambulation.  Medication Inspection Compliance: Pill count conducted under aseptic conditions, in front of the patient. Neither the pills nor the bottle was removed from the patient's sight at any time. Once count was completed pills were immediately returned to the patient in their original bottle.  Medication: Oxycodone /APAP Pill/Patch Count: 88 of 90 pills/patches remain Pill/Patch Appearance: Markings consistent with prescribed medication Bottle Appearance: Standard pharmacy container. Clearly labeled. Filled Date: 09 / 02 / 2025 Last Medication intake:  Today

## 2024-07-27 ENCOUNTER — Encounter: Payer: Self-pay | Admitting: Internal Medicine

## 2024-07-27 ENCOUNTER — Ambulatory Visit: Admitting: Internal Medicine

## 2024-07-27 VITALS — BP 124/52 | HR 61 | Ht 60.0 in | Wt 163.2 lb

## 2024-07-27 DIAGNOSIS — E782 Mixed hyperlipidemia: Secondary | ICD-10-CM

## 2024-07-27 DIAGNOSIS — E039 Hypothyroidism, unspecified: Secondary | ICD-10-CM

## 2024-07-27 DIAGNOSIS — D472 Monoclonal gammopathy: Secondary | ICD-10-CM | POA: Diagnosis not present

## 2024-07-27 DIAGNOSIS — I10 Essential (primary) hypertension: Secondary | ICD-10-CM

## 2024-07-27 DIAGNOSIS — R7301 Impaired fasting glucose: Secondary | ICD-10-CM

## 2024-07-27 DIAGNOSIS — I5032 Chronic diastolic (congestive) heart failure: Secondary | ICD-10-CM | POA: Diagnosis not present

## 2024-07-27 DIAGNOSIS — N183 Chronic kidney disease, stage 3 unspecified: Secondary | ICD-10-CM | POA: Diagnosis not present

## 2024-07-27 DIAGNOSIS — R5383 Other fatigue: Secondary | ICD-10-CM

## 2024-07-27 DIAGNOSIS — I050 Rheumatic mitral stenosis: Secondary | ICD-10-CM | POA: Diagnosis not present

## 2024-07-27 DIAGNOSIS — Z951 Presence of aortocoronary bypass graft: Secondary | ICD-10-CM | POA: Diagnosis not present

## 2024-07-27 DIAGNOSIS — D631 Anemia in chronic kidney disease: Secondary | ICD-10-CM

## 2024-07-27 DIAGNOSIS — I350 Nonrheumatic aortic (valve) stenosis: Secondary | ICD-10-CM | POA: Diagnosis not present

## 2024-07-27 DIAGNOSIS — G4710 Hypersomnia, unspecified: Secondary | ICD-10-CM

## 2024-07-27 DIAGNOSIS — I2510 Atherosclerotic heart disease of native coronary artery without angina pectoris: Secondary | ICD-10-CM | POA: Diagnosis not present

## 2024-07-27 DIAGNOSIS — R0602 Shortness of breath: Secondary | ICD-10-CM | POA: Diagnosis not present

## 2024-07-27 DIAGNOSIS — K219 Gastro-esophageal reflux disease without esophagitis: Secondary | ICD-10-CM | POA: Diagnosis not present

## 2024-07-27 DIAGNOSIS — I739 Peripheral vascular disease, unspecified: Secondary | ICD-10-CM | POA: Diagnosis not present

## 2024-07-27 DIAGNOSIS — N1832 Chronic kidney disease, stage 3b: Secondary | ICD-10-CM | POA: Diagnosis not present

## 2024-07-27 NOTE — Assessment & Plan Note (Signed)
 LIKELY MEDICATION INDUCED.  Advised to reudce oxycodone  to 1/2 tablet every 8 hours and suspend Pamelor .  Reassess symptoms in one week

## 2024-07-27 NOTE — Progress Notes (Signed)
 Subjective:  Patient ID: Sabrina [name], female    DOB: October 03, 1941  Age: 83 y.o. MRN: [id number]  CC: The primary encounter diagnosis was Essential hypertension. Diagnoses of Acquired hypothyroidism, Mixed hyperlipidemia, Fatigue, unspecified type, Impaired fasting glucose, Hypersomnolence, and Anemia of chronic kidney failure, stage 3 (moderate) (HCC) were also pertinent to this visit.   HPI Sabrina [name] presents for  Chief Complaint  Patient presents with   Medical Management of Chronic Issues    1) chronic back pain managed by Pain Management  with recent  change from hydrocodone  to oxycodone  .  She has only taken 2 of the tablets  and feels lethargic.  She wants to reduce her medication because she is chronically fatigued and sleepy.  She has asked her daughter to join us  via telephone  .  Her daughter has MS and uses baclofen  and adderall to manage er daytime fatigue.   patient would like to try adderall to manage her fatigue.  She has a history fo resistant hypertension  and PVD   Outpatient Medications Prior to Visit  Medication Sig Dispense Refill   amLODipine  (NORVASC ) 10 MG tablet Take 1 tablet (10 mg total) by mouth at bedtime. 90 tablet 0   aspirin  81 MG tablet Take 81 mg by mouth in the morning.     clopidogrel  (PLAVIX ) 75 MG tablet Take 1 tablet (75 mg total) by mouth at bedtime. To prevent strokes 90 tablet 1   ezetimibe  (ZETIA ) 10 MG tablet Take 1 tablet (10 mg total) by mouth at bedtime. For cholesterol 90 tablet 1   ferrous sulfate  ER (SLOW FE) 142 (45 Fe) MG TBCR tablet Take 1 tablet (45 mg of iron total) by mouth daily. 30 tablet 5   furosemide  (LASIX ) 20 MG tablet Take 1 tablet (20 mg total) by mouth daily. For fluid retention 90 tablet 1   hydrALAZINE  (APRESOLINE ) 50 MG tablet TAKE 1 TABLET BY MOUTH THREE TIMES DAILY AS NEEDED FOR BLOOD PRESSURE GREATER THAN 150 270 tablet 0   labetalol  (NORMODYNE ) 300 MG tablet TAKE 1 TABLET BY MOUTH TWICE DAILY FOR HIGH  BLOOD PRESSURE 180 tablet 1   losartan  (COZAAR ) 50 MG tablet Take 1 tablet (50 mg total) by mouth at bedtime. For blood pressure 90 tablet 1   methocarbamol  (ROBAXIN ) 500 MG tablet Take 1 tablet (500 mg total) by mouth every 8 (eight) hours as needed for muscle spasms. 30 tablet 0   mirabegron  ER (MYRBETRIQ ) 50 MG TB24 tablet Take 1 tablet (50 mg total) by mouth daily. 30 tablet 11   oxyCODONE -acetaminophen  (PERCOCET) 5-325 MG tablet Take 1 tablet by mouth every 8 (eight) hours as needed for severe pain (pain score 7-10). Must last 30 days. 90 tablet 0   pantoprazole  (PROTONIX ) 40 MG tablet One tablet upon waking,  take 30 minuts prior to eating 90 tablet 1   ranolazine  (RANEXA ) 500 MG 12 hr tablet Take 1 tablet (500 mg total) by mouth 2 (two) times daily. For chronic chest pain 180 tablet 1   nortriptyline  (PAMELOR ) 10 MG capsule Take 1 capsule (10 mg total) by mouth 2 (two) times daily. For chronic headaches 180 capsule 3   No facility-administered medications prior to visit.    Review of Systems;  Patient denies headache, fevers, malaise, unintentional weight loss, skin rash, eye pain, sinus congestion and sinus pain, sore throat, dysphagia,  hemoptysis , cough, dyspnea, wheezing, chest pain, palpitations, orthopnea, edema, abdominal pain, nausea, melena, diarrhea, constipation, flank pain, dysuria,  hematuria, urinary  Frequency, nocturia, numbness, tingling, seizures,  Focal weakness, Loss of consciousness,  Tremor, insomnia, depression, anxiety, and suicidal ideation.      Objective:  BP (!) 124/52   Pulse 61   Ht 5' (1.524 m)   Wt 163 lb 3.2 oz (74 kg)   SpO2 96%   BMI 31.87 kg/m   BP Readings from Last 3 Encounters:  07/27/24 (!) 124/52  07/26/24 (!) 141/53  06/26/24 (!) 148/48    Wt Readings from Last 3 Encounters:  07/27/24 163 lb 3.2 oz (74 kg)  07/26/24 163 lb (73.9 kg)  06/26/24 162 lb (73.5 kg)    Physical Exam Vitals reviewed.  Constitutional:      General: She  is not in acute distress.    Appearance: Normal appearance. She is normal weight. She is not ill-appearing, toxic-appearing or diaphoretic.  HENT:     Head: Normocephalic.  Eyes:     General: No scleral icterus.       Right eye: No discharge.        Left eye: No discharge.     Conjunctiva/sclera: Conjunctivae normal.  Cardiovascular:     Rate and Rhythm: Normal rate and regular rhythm.     Heart sounds: Normal heart sounds.  Pulmonary:     Effort: Pulmonary effort is normal. No respiratory distress.     Breath sounds: Normal breath sounds.  Musculoskeletal:        General: Normal range of motion.  Skin:    General: Skin is warm and dry.  Neurological:     General: No focal deficit present.     Mental Status: She is alert and oriented to person, place, and time. Mental status is at baseline.  Psychiatric:        Mood and Affect: Mood normal.        Behavior: Behavior normal.        Thought Content: Thought content normal.        Judgment: Judgment normal.     Lab Results  Component Value Date   HGBA1C 5.9 07/19/2016   HGBA1C 5.8 06/02/2015    Lab Results  Component Value Date   CREATININE 1.50 (H) 06/20/2024   CREATININE 1.80 (H) 06/05/2024   CREATININE 1.70 (H) 11/10/2023    Lab Results  Component Value Date   WBC 5.9 06/05/2024   HGB 9.9 (L) 06/20/2024   HCT 29.0 (L) 06/20/2024   PLT 266 06/05/2024   GLUCOSE 98 06/20/2024   CHOL 187 04/26/2023   TRIG 51.0 04/26/2023   HDL 61.00 04/26/2023   LDLDIRECT 107.0 04/26/2023   LDLCALC 116 (H) 04/26/2023   ALT 13 06/05/2024   AST 19 06/05/2024   NA 139 06/20/2024   K 3.6 06/20/2024   CL 107 06/20/2024   CREATININE 1.50 (H) 06/20/2024   BUN 18 06/20/2024   CO2 23 06/05/2024   TSH 3.84 10/28/2023   INR 1.3 (H) 03/31/2022   HGBA1C 5.9 07/19/2016    PERIPHERAL VASCULAR CATHETERIZATION Result Date: 06/20/2024 Patient name: Sabrina [name] MRN: [id number] DOB: [birth year] Sex: female 06/20/2024 Pre-operative  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  Ultrasound-guided micropuncture access of the right common femoral artery in retrograde fashion 2.  Aortogram 3.  Selective imaging of the left renal artery, selective imaging of the right renal artery 4.  Drug-coated balloon angioplasty left renal artery 4 x 40 mm balloon 5.  Drug-coated balloon  angioplasty of the right renal artery 5 x 40 mm balloon 6.  Dedicated imaging of the bilateral common, external iliac arteries 7.  Device assisted closure-Mynx 8.  Moderate sedation time 55 minutes, contrast volume 75 mL Indications:  Patient is an 83 year old female with multiple endovascular inventions at outside hospital whom I met several years ago. I have performed bilateral renal balloon angioplasty, right EIA angioplasty in 2023, followed by balloon angioplasty of the iliac arteries for in-stent restenosis as well. Imaging today demonstrated concern for left renal artery stenosis with velocities greater than 400.  I was concerned that the in-stent restenosis could lead to thrombosis of the stent.  After discussing the risks and benefits of diagnostic angiography with possible intervention, Xariah elected to proceed. Findings: Bilateral renal arteries with greater than 80% flow-limiting stenosis at the mid stent Widely patent infrarenal abdominal aorta.  No flow-limiting stenosis identified in the aortoiliac segments bilaterally.  Procedure:  The patient was identified in the holding area and taken to room 8.  The patient was then placed supine on the table and prepped and draped in the usual sterile fashion.  A time out was called.  Ultrasound was used to evaluate the right common femoral artery.  It was patent .  A digital ultrasound image was acquired.  A micropuncture needle was used to access the right common femoral artery under ultrasound guidance.  An 018 wire was advanced  without resistance and a micropuncture sheath was placed.  The 018 wire was removed and a benson wire was placed.  The micropuncture sheath was exchanged for a 5 french sheath.  An omniflush catheter was advanced over the wire to the level of T12, and abdominal angiogram followed.  See results above.  I elected to intervene on the bilateral renal arteries.  A short 6 French sheath was placed, and the patient heparinized.  Next, a 6 [place] IM guide catheter was brought onto the field and positioned at the level of the renal arteries.  I began with the left renal artery.  The IM catheter was placed in the stent and diagnostic angiogram followed.  This was cannulated using a 0.014 wire followed by 4 x 40 mm drug-coated balloon.  We did not have a smaller size in regards to length.  This was inflated for 2 minutes with follow-up angiography demonstrating excellent result.  There was no residual flow-limiting stenosis.  Next, the wire was removed, and using the IM catheter, I was able to cannulate the right renal artery.  Diagnostic angiogram followed, and in similar fashion, a 5 x 40 mm balloon was brought onto the field, laid across the stents, and inflated.  Follow-up angiography demonstrated excellent result with resolution of flow-limiting stenosis. Angiography of the infrarenal aorta and iliacs followed.  See results above.  No intervention was necessary. Patient was closed using a minx device without issue. Impression: Successful bilateral drug-coated balloon angioplasty of the renal arteries for in-stent restenosis. Sabrina FORBES Rim MD [facility] Office: [phone]    Assessment & Plan:  .Essential hypertension -     Comprehensive metabolic panel with GFR; Future -     Microalbumin / creatinine urine ratio; Future  Acquired hypothyroidism -     TSH; Future  Mixed hyperlipidemia -     Lipid panel; Future -     LDL cholesterol, direct; Future  Fatigue, unspecified  type -     CBC with Differential/Platelet; Future  Impaired fasting glucose -  Hemoglobin A1c; Future  Hypersomnolence Assessment & Plan: LIKELY MEDICATION INDUCED.  Advised to reudce oxycodone  to 1/2 tablet every 8 hours and suspend Pamelor .  Reassess symptoms in one week    Anemia of chronic kidney failure, stage 3 (moderate) (HCC) Assessment & Plan:  Iron stores are normal with  iron supplement prescribed by Dr Babara but not tolerating it.  She declined IV Venofer    Lab Results  Component Value Date   WBC 5.9 06/05/2024   HGB 9.9 (L) 06/20/2024   HCT 29.0 (L) 06/20/2024   MCV 90.7 06/05/2024   PLT 266 06/05/2024   Lab Results  Component Value Date   IRON 62 06/05/2024   TIBC 328 06/05/2024   FERRITIN 186 06/05/2024      .   Follow-up: No follow-ups on file.   Verneita LITTIE Kettering, MD

## 2024-07-27 NOTE — Patient Instructions (Signed)
 Reduce oxycodone  1/2 tablet every 8 hours AND Suspend pamelor  (nortriptyline  ) for a  few  days    Let me know if you feel less sleepy on this regimen in a  few  days

## 2024-07-29 NOTE — Assessment & Plan Note (Signed)
 Iron stores are normal with  iron supplement prescribed by Dr Babara but not tolerating it.  She declined IV Venofer    Lab Results  Component Value Date   WBC 5.9 06/05/2024   HGB 9.9 (L) 06/20/2024   HCT 29.0 (L) 06/20/2024   MCV 90.7 06/05/2024   PLT 266 06/05/2024   Lab Results  Component Value Date   IRON 62 06/05/2024   TIBC 328 06/05/2024   FERRITIN 186 06/05/2024

## 2024-07-30 ENCOUNTER — Telehealth: Payer: Self-pay

## 2024-07-30 NOTE — Telephone Encounter (Signed)
 noted

## 2024-07-30 NOTE — Telephone Encounter (Signed)
 Copied from CRM [id number]. Topic: General - Other >> Jul 30, 2024 11:18 AM Olam RAMAN wrote: Reason for CRM:  Pt is upset with so many appt on 10/9 and needs clinic to cb at [phone]. Advised she doesn't know why she has 2 appt in one day  I spoke with patient and let her know that the appointments she has scheduled on 08/02/2024 are not in our office.  I let patient know that it looks like she has an ultrasound appointment at 9am and a post-op appointment with Dr. Fonda Rim at 10am.  Patient states she just had an ultrasound a month ago and would like to know why she is having another one.  Patient states she will contact Dr. Fonda Rim office to find out what's going on.

## 2024-08-01 NOTE — Progress Notes (Unsigned)
 Office Note    HPI: Sabrina [name] is a 83 y.o. (1941/03/11) female presenting in follow up s/p right groin exploration, redo CFA patch plasty with ipsilateral GSV on 03/30/22.  Prior surgeries were performed at [facility] and include: Right renal artery stenting 4mm / Left renal artery stenting (6mm) (2015) Left subclavian artery stenting 9mm (2023) Bilateral common iliac artery stenting Bilateral external iliac stenting Left common femoral, profunda femoris, and superficial femoral artery endarterectomies Right common femoral, profunda femoris, and superficial femoral artery endarterectomies Stent placement to left external iliac artery with 8 mm diameter by 4 cm length of life star stent  My interventions: 09/2022 balloon angioplasty of the right EIA, bilateral renal angioplasty  04/2022 Angio for elevated velocities in the iliac artery stents.  Angiography demonstrated widely patent stents bilaterally. 05/2024 BL renal artery angioplasty for primary assisted patency of previously placed stents. BL iliacs widely patent.   On exam today, Sabrina [name] was doing well.  Stated she is working with physical therapy due to issues with her back.  She notes some right thigh pain and weakness with flexion.  No significant claudication, ischemic rest pain, tissue loss.  Creatinine 1.8 at last check, slightly worsened over the last 6 months.  She spent a month down in  [place]  with her daughter, with plans to move permanently, but has yet to make this jump.    Denies food fear, weight loss.  Denies stroke, TIA, amaurosis symptoms. Denies abdominal pain, back pain.   The pt is  on a daily aspirin .   Other AC:  plavix  The pt is  on medication for hypertension.   The pt is not diabetic.  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 b.) TTE 11/16/2021: EF >55%; mild AS (MPG 9 mmHg)   CAD (coronary artery disease)    a.) s/p 3v CABG 03/29/2000   Cardiac arrest [facility])    Carotid artery stenosis    a.) s/p LEFT CEA 09/09/2003. b.) Carotid doppler 88/78/7980: 1-39% LICA, CTO RICA; subclavian stenosis   Cataracts, bilateral    Cervical spondylosis without myelopathy    Chronic diastolic CHF (congestive heart failure), NYHA class 3 (HCC)    a.) TTE 05/27/2016: EF >55%, mild LA enlargement, triv PR, mild MR, mod TR; G3DD. b.) TTE 12/12/2017: EF >55%, mild LVH, BAE, mild MR/PR, mod TR; RVSP 52.8 mmHg. c.) TTE 03/18/2020: EF >55%, BAD, AS (MPG 8.7 mmHg); triv MR, mild TR/PR. d.) TTE 11/16/2021: EF >55%, LVH, G1DD, triv MR, mild PR, mod TR; AS (MPG 9 mmHg); MS (MPG 5 mmHg)   Chronic kidney disease, stage III (moderate) (HCC)    Chronic narcotic use 06/24/2014   Chronic pain syndrome    GERD (gastroesophageal reflux disease)    History of 2019 novel coronavirus disease (COVID-19) 09/22/2021   Hyperlipidemia    Hypertension    Long term current use of antithrombotics/antiplatelets    a.) on daily DAPT therapy (ASA + clopidogrel )   Lumbar stenosis with neurogenic claudication    Mitral stenosis 11/16/2021   a.) TTE 11/16/2021: EF >55%;  mod MS (MPG 5 mmHg)   Osteoarthritis of hip    Postoperative wound infection 03/29/2022   Pulmonary hypertension (HCC) 12/12/2017   a.) TTE 12/12/2017: mild; RVSP 52.8 mmHg   PVD (peripheral vascular disease)    Renal artery stenosis    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    Past Surgical History:  Procedure Laterality Date   ABDOMINAL AORTOGRAM W/LOWER EXTREMITY N/A 10/06/2022   Procedure: ABDOMINAL AORTOGRAM W/LOWER EXTREMITY;  Surgeon: Lanis Fonda BRAVO, MD;  Location: [facility] INVASIVE CV LAB;  Service: Cardiovascular;  Laterality: N/A;   ABDOMINAL AORTOGRAM W/LOWER EXTREMITY N/A 05/04/2023   Procedure: ABDOMINAL AORTOGRAM W/LOWER EXTREMITY;  Surgeon: Lanis Fonda BRAVO, MD;  Location: [facility] INVASIVE CV LAB;  Service: Cardiovascular;  Laterality: N/A;   ABDOMINAL AORTOGRAM W/LOWER EXTREMITY N/A 06/20/2024   Procedure: ABDOMINAL AORTOGRAM W/LOWER EXTREMITY;  Surgeon: Lanis Fonda BRAVO, MD;  Location: [facility] INVASIVE CV LAB;  Service: Cardiovascular;  Laterality: N/A;   ABDOMINAL HYSTERECTOMY  1976   CAROTID ARTERY ANGIOPLASTY Left    CAROTID ENDARTERECTOMY Left 09/09/2003   Procedure: CAROTID ENDARTERECTOMY; Location: [facility]; Surgeon: Charlie Lango, MD   COLONOSCOPY WITH PROPOFOL  N/A 08/16/2017   Procedure: COLONOSCOPY WITH PROPOFOL ;  Surgeon: Jinny Carmine, MD;  Location: [facility] ENDOSCOPY;  Service: Endoscopy;  Laterality: N/A;   CORONARY ANGIOPLASTY WITH STENT PLACEMENT  2000   CORONARY ARTERY BYPASS GRAFT N/A 03/29/2000   Procedure: 3v CORONARY ARTERY BYPASS GRAFT; Location: [facility]   CYSTOSCOPY WITH STENT PLACEMENT Bilateral    ENDARTERECTOMY FEMORAL Bilateral 03/17/2022   Procedure: ENDARTERECTOMY FEMORAL ( BILATERAL SFA STENT);  Surgeon: Jama Cordella MATSU, MD;  Location: [facility] ORS;  Service: Vascular;  Laterality: Bilateral;   ENDARTERECTOMY FEMORAL Right 03/30/2022   Procedure: RE-EXPOSURE OF RIGHT COMMON FEMORAL ARTERY, RE-DO RIGHT FEMORAL ENDARTERECTOMY WITH VEIN PATCH;  Surgeon: Lanis Fonda BRAVO, MD;  Location: [facility] OR;  Service: Vascular;  Laterality: Right;  WOUND VAC   ESOPHAGOGASTRODUODENOSCOPY (EGD) WITH PROPOFOL  N/A 08/16/2017   Procedure: ESOPHAGOGASTRODUODENOSCOPY (EGD) WITH PROPOFOL ;  Surgeon: Jinny Carmine, MD;  Location: [facility]  ENDOSCOPY;  Service: Endoscopy;  Laterality: N/A;   ESOPHAGOGASTRODUODENOSCOPY (EGD) WITH PROPOFOL  N/A 06/29/2018   Procedure: ESOPHAGOGASTRODUODENOSCOPY (EGD) WITH PROPOFOL ;  Surgeon: Janalyn Keene NOVAK, MD;  Location: [facility] ENDOSCOPY;  Service: Endoscopy;  Laterality: N/A;   GROIN DEBRIDEMENT Right 03/30/2022   Procedure: RIGHT GROIN IRRIGATION & DEBRIDEMENT;  Surgeon: Lanis Fonda BRAVO, MD;  Location: [facility] OR;  Service: Vascular;  Laterality: Right;   INSERTION OF ILIAC STENT Left 03/17/2022   Procedure: INSERTION OF ILIAC STENT;  Surgeon: Jama Cordella MATSU, MD;  Location: [facility] ORS;  Service: Vascular;  Laterality: Left;   LAPAROSCOPIC CHOLECYSTECTOMY Left 10/26/1999   Procedure: LAPAROSCOPIC CHOLECYSTECTOMY; Location: [facility]; Surgeon: Unknown Sharps, MD   LEFT HEART CATH AND CORONARY ANGIOGRAPHY N/A 11/18/2021   Procedure: LEFT HEART CATH AND CORONARY ANGIOGRAPHY;  Surgeon: Florencio Cara BIRCH, MD;  Location: [facility] INVASIVE CV LAB;  Service: Cardiovascular;  Laterality: N/A;   LEFT HEART CATH AND CORS/GRAFTS ANGIOGRAPHY Left 11/19/2002   Procedure: LEFT HEART CATH AND CORS/GRAFTS ANGIOGRAPHY; Location: [facility]; Surgeon: Margie Florencio, MD   LEFT HEART CATH AND CORS/GRAFTS ANGIOGRAPHY Left 09/17/2003   Procedure: LEFT HEART CATH AND CORS/GRAFTS ANGIOGRAPHY; Location: [facility]; Surgeon: Margie Florencio, MD   LOWER EXTREMITY  ANGIOGRAPHY Right 01/06/2022   Procedure: Lower Extremity Angiography;  Surgeon: Jama Cordella MATSU, MD;  Location: [facility] INVASIVE CV LAB;  Service: Cardiovascular;  Laterality: Right;   PERIPHERAL VASCULAR BALLOON ANGIOPLASTY  10/06/2022   Procedure: PERIPHERAL VASCULAR BALLOON ANGIOPLASTY;  Surgeon: Lanis Fonda BRAVO, MD;  Location: [facility] INVASIVE CV LAB;  Service: Cardiovascular;;  left iliac and bilateral renal   RENAL ARTERY ANGIOPLASTY Bilateral 12/2013   RENAL INTERVENTION Bilateral 06/20/2024   Procedure: RENAL INTERVENTION;  Surgeon: Lanis Fonda BRAVO, MD;  Location: [facility] INVASIVE CV LAB;   Service: Cardiovascular;  Laterality: Bilateral;   TOE AMPUTATION Right    small toe   TONSILLECTOMY AND ADENOIDECTOMY     TOTAL HIP ARTHROPLASTY Left 2005   TOTAL HIP ARTHROPLASTY Right 2015   UPPER EXTREMITY ANGIOGRAPHY Left 11/23/2021   Procedure: UPPER EXTREMITY ANGIOGRAPHY;  Surgeon: Marea Selinda RAMAN, MD;  Location: [facility] INVASIVE CV LAB;  Service: Cardiovascular;  Laterality: Left;    Social History   Socioeconomic History   Marital status: Divorced    Spouse name: Not on file   Number of children: 3   Years of education: Not on file   Highest education level: Not on file  Occupational History   Occupation: retired  Tobacco Use   Smoking status: Former    Current packs/day: 0.00    Types: Cigarettes    Quit date: 10/25/1998    Years since quitting: 25.7    Passive exposure: Past   Smokeless tobacco: Never  Vaping Use   Vaping status: Never Used  Substance and Sexual Activity   Alcohol use: No    Alcohol/week: 0.0 standard drinks of alcohol   Drug use: Never   Sexual activity: Not Currently  Other Topics Concern   Not on file  Social History Narrative   Daughter Grayce ([place]); 1 in [employer]; 1 in [place]   Lives alone   Social Drivers of Health   Financial Resource Strain: Low Risk  (07/18/2024)   [facility]   Overall Financial Resource Strain (CARDIA)    Difficulty of Paying Living Expenses: Not hard at all  Food Insecurity: No Food Insecurity (07/18/2024)   Received from [facility]   Hunger Vital Sign    Within the past 12 months, you worried that your food would run out before you got the money to buy more.: Never true    Within the past 12 months, the food you bought just didn't last and you didn't have money to get more.: Never true  Transportation Needs: No Transportation Needs (07/18/2024)   Received [facility] - [facility]    In the past 12 months, has lack of  transportation kept you from medical appointments or from getting medications?: No    Lack of Transportation (Non-Medical): No  Physical Activity: Inactive (05/14/2024)   Exercise Vital Sign    Days of Exercise per Week: 1 day    Minutes of Exercise per Session: 0 min  Stress: No Stress Concern Present (05/14/2024)   [employer] of Occupational Health - Occupational Stress Questionnaire    Feeling of Stress: Not at all  Social Connections: Moderately Integrated (05/14/2024)   Social Connection and Isolation Panel    Frequency of Communication with Friends and Family: More than three times a week    Frequency of Social Gatherings with Friends and Family: More than three times a week    Attends Religious Services: More than 4  times per year    Active Member of Clubs or Organizations: Yes    Attends Banker Meetings: More than 4 times per year    Marital Status: Divorced  Intimate Partner Violence: Not At Risk (05/14/2024)   Humiliation, Afraid, Rape, and Kick questionnaire    Fear of Current or Ex-Partner: No    Emotionally Abused: No    Physically Abused: No    Sexually Abused: No    Family History  Problem Relation Age of Onset   Stroke Mother    Hypertension Mother    Diabetes Mother    Hypertension Father    Heart disease Sister        MI   Multiple sclerosis Daughter    Multiple sclerosis Son    Cerebral aneurysm Son    Seizures Son    Cerebral aneurysm Son    Breast cancer Paternal Aunt 56    Current Outpatient Medications  Medication Sig Dispense Refill   amLODipine  (NORVASC ) 10 MG tablet Take 1 tablet (10 mg total) by mouth at bedtime. 90 tablet 0   aspirin  81 MG tablet Take 81 mg by mouth in the morning.     clopidogrel  (PLAVIX ) 75 MG tablet Take 1 tablet (75 mg total) by mouth at bedtime. To prevent strokes 90 tablet 1   ezetimibe  (ZETIA ) 10 MG tablet Take 1 tablet (10 mg total) by mouth at bedtime. For cholesterol 90 tablet 1   ferrous sulfate  ER  (SLOW FE) 142 (45 Fe) MG TBCR tablet Take 1 tablet (45 mg of iron total) by mouth daily. 30 tablet 5   furosemide  (LASIX ) 20 MG tablet Take 1 tablet (20 mg total) by mouth daily. For fluid retention 90 tablet 1   hydrALAZINE  (APRESOLINE ) 50 MG tablet TAKE 1 TABLET BY MOUTH THREE TIMES DAILY AS NEEDED FOR BLOOD PRESSURE GREATER THAN 150 270 tablet 0   labetalol  (NORMODYNE ) 300 MG tablet TAKE 1 TABLET BY MOUTH TWICE DAILY FOR HIGH BLOOD PRESSURE 180 tablet 1   losartan  (COZAAR ) 50 MG tablet Take 1 tablet (50 mg total) by mouth at bedtime. For blood pressure 90 tablet 1   methocarbamol  (ROBAXIN ) 500 MG tablet Take 1 tablet (500 mg total) by mouth every 8 (eight) hours as needed for muscle spasms. 30 tablet 0   mirabegron  ER (MYRBETRIQ ) 50 MG TB24 tablet Take 1 tablet (50 mg total) by mouth daily. 30 tablet 11   oxyCODONE -acetaminophen  (PERCOCET) 5-325 MG tablet Take 1 tablet by mouth every 8 (eight) hours as needed for severe pain (pain score 7-10). Must last 30 days. 90 tablet 0   pantoprazole  (PROTONIX ) 40 MG tablet One tablet upon waking,  take 30 minuts prior to eating 90 tablet 1   ranolazine  (RANEXA ) 500 MG 12 hr tablet Take 1 tablet (500 mg total) by mouth 2 (two) times daily. For chronic chest pain 180 tablet 1   No current facility-administered medications for this visit.    Allergies  Allergen Reactions   Celexa  [Citalopram ] Anaphylaxis    Throat closing    Dilaudid  [Hydromorphone  Hcl] Nausea And Vomiting   Effient [Prasugrel] Itching   Hydrochlorothiazide  Other (See Comments)    Decreased GFR (Nov 2015)   Liothyronine      Hair fell out, caused headaches    Nsaids     CKD stage III - avoid nephrotoxic drugs   Nubain [Nalbuphine Hcl]     Burning sensation in back   Penicillins Itching   Statins Itching  REVIEW OF SYSTEMS:   [X]  denotes positive finding, [ ]  denotes negative finding Cardiac  Comments:  Chest pain or chest pressure:    Shortness of breath upon exertion:     Short of breath when lying flat:    Irregular heart rhythm:        Vascular    Pain in calf, thigh, or hip brought on by ambulation:    Pain in feet at night that wakes you up from your sleep:     Blood clot in your veins:    Leg swelling:         Pulmonary    Oxygen at home:    Productive cough:     Wheezing:         Neurologic    Sudden weakness in arms or legs:     Sudden numbness in arms or legs:     Sudden onset of difficulty speaking or slurred speech:    Temporary loss of vision in one eye:     Problems with dizziness:         Gastrointestinal    Blood in stool:     Vomited blood:         Genitourinary    Burning when urinating:     Blood in urine:        Psychiatric    Major depression:         Hematologic    Bleeding problems:    Problems with blood clotting too easily:        Skin    Rashes or ulcers:        Constitutional    Fever or chills:      PHYSICAL EXAMINATION:  There were no vitals filed for this visit.    General:  WDWN in NAD; vital signs documented above Gait: Not observed HENT: WNL, normocephalic Pulmonary: normal non-labored breathing , without wheezing Cardiac: regular HR, Abdomen: soft, NT, no masses Skin: without rashes Vascular Exam/Pulses:  Right Left  Radial 2+ (normal) 2+ (normal)  Ulnar 2+ (normal) 2+ (normal)  Femoral    Popliteal    DP nonpalpable nonpalpable  PT nonpalpable nonpalpable   Extremities: without ischemic changes, without Gangrene , without cellulitis; without open wounds;  Right groin wound with 3 small areas opened, probe less than 1 cm, Musculoskeletal: no muscle wasting or atrophy  Neurologic: A&O X 3;  No focal weakness or paresthesias are detected Psychiatric:  The pt has Normal affect.   Non-Invasive Vascular Imaging:    +------------------+--------+--------+-------+  Right Renal ArteryPSV cm/sEDV cm/sComment  +------------------+--------+--------+-------+  Origin              203      22            +------------------+--------+--------+-------+  Proximal           184      19            +------------------+--------+--------+-------+  Mid                131      13            +------------------+--------+--------+-------+  Distal             129      9             +------------------+--------+--------+-------+   +-----------------+--------+--------+-------+  Left Renal ArteryPSV cm/sEDV cm/sComment  +-----------------+--------+--------+-------+  Origin  374      38            +-----------------+--------+--------+-------+  Proximal          240      13            +-----------------+--------+--------+-------+  Mid               122      17            +-----------------+--------+--------+-------+  Distal             84      18            +-----------------+--------+--------+-------+    Abdominal Aorta Findings:  +-------------+-------+----------+---------+-----------+--------+----------  ----+  Location    AP (cm)Trans (cm)PSV      Waveform   ThrombusComments                                       (cm/s)                                       +-------------+-------+----------+---------+-----------+--------+----------  ----+  Proximal    2.40   2.40      73                                           +-------------+-------+----------+---------+-----------+--------+----------  ----+  Distal      3.40   3.40      130                         fusiform         +-------------+-------+----------+---------+-----------+--------+----------  ----+  RT CIA Prox                   198      biphasic                            +-------------+-------+----------+---------+-----------+--------+----------  ----+  RT CIA Mid                    200      biphasic                            +-------------+-------+----------+---------+-----------+--------+----------  ----+   RT CIA Distal                 318      biphasic                            +-------------+-------+----------+---------+-----------+--------+----------  ----+  RT EIA Prox                   269      biphasic                            +-------------+-------+----------+---------+-----------+--------+----------  ----+  RT EIA Mid                    299  multiphasic                         +-------------+-------+----------+---------+-----------+--------+----------  ----+  RT EIA Distal                 305      multiphasic                         +-------------+-------+----------+---------+-----------+--------+----------  ----+  LT CIA Prox                                               not  visualized  +-------------+-------+----------+---------+-----------+--------+----------  ----+  LT CIA Mid                                                not  visualized  +-------------+-------+----------+---------+-----------+--------+----------  ----+  LT CIA Distal                 226      biphasic                            +-------------+-------+----------+---------+-----------+--------+----------  ----+     Left Stent(s):  +--------------------+--------+---------------+-----------+----------------  ----+  External Iliac      PSV cm/sStenosis       Waveform   Comments               Artery                                                                       +--------------------+--------+---------------+-----------+----------------  ----+  Prox to Stent       226                    biphasic   distal common  iliac                                                         artery                 +--------------------+--------+---------------+-----------+----------------  ----+  Proximal Stent      293                    multiphasic                        +--------------------+--------+---------------+-----------+----------------  ----+  Mid Stent           312     50-99% stenosisbiphasic                          +--------------------+--------+---------------+-----------+----------------  ----+  Distal Stent        252  biphasic                          +--------------------+--------+---------------+-----------+----------------  ----+  Distal to Stent     209                    multiphasic                       +--------------------+--------+---------------+-----------+----------------  ----+   8/14 +-------+-----------+-----------+------------+------------+  ABI/TBIToday's ABIToday's TBIPrevious ABIPrevious TBI  +-------+-----------+-----------+------------+------------+  Right .57        .39        .53         .42           +-------+-----------+-----------+------------+------------+  Left  .57        .48        .65         .43           +-------+-----------+-----------+------------+------------+    Left Carotid Findings:  +----------+--------+--------+--------+------------------+--------+           PSV cm/sEDV cm/sStenosisPlaque DescriptionComments  +----------+--------+--------+--------+------------------+--------+  CCA Prox  158     20                                          +----------+--------+--------+--------+------------------+--------+  CCA Distal88      19                                          +----------+--------+--------+--------+------------------+--------+  ICA Prox  58      8               heterogenous                +----------+--------+--------+--------+------------------+--------+  ICA Mid   83      24      1-39%   heterogenous                +----------+--------+--------+--------+------------------+--------+  ICA Distal72      24                                           +----------+--------+--------+--------+------------------+--------+  ECA      77      7                                           +----------+--------+--------+--------+------------------+--------+   +----------+--------+--------+-----------------------------+---------------  ----+           PSV cm/sEDV cm/sDescribe                     Arm Pressure  (mmHG)  +----------+--------+--------+-----------------------------+---------------  ----+  [id number]    0       Stenotic and Turbulent,      163  proximal stent not visualized                      +----------+--------+--------+-----------------------------+---------------  ----+   +---------+--------+--+--------+-+---------+  VertebralPSV cm/s35EDV cm/s6Antegrade  +---------+--------+--+--------+-+---------+    ASSESSMENT/PLAN: Sabrina [name] is a 83 y.o. female presenting in follow up s/p right groin exploration, redo CFA patch plasty with ipsilateral GSV.  In December 2023 she underwent right-sided EIA drug-coated balloon angioplasty, bilateral renal artery angioplasty for assisted primary patency of bilateral renal artery stents.  Last imaging occurred 04/2023 due to concern for threatened stents.  Imaging demonstrated widely patent iliac stents bilaterally.  Imaging studies were reviewed.  Iliac velocities are elevated, but relatively stable -similar to the velocities at the time of her last angiogram.  ABIs stable, carotid disease stable.  I am most concerned about her renal arteries and worsening kidney function.  Renal artery duplex ultrasound demonstrates critical stenosis at the left renal artery origin.  I am worried that the stent has in-stent restenosis, and is threatened.  This was plastied with a drug-coated balloon 2 years ago for a similar reason.  I had a long conversation with Alexxis regarding the above.  I think she would benefit from renal  artery angiogram with possible intervention in an effort to define the level of stenosis and treat to prevent thrombosis of the previously placed stents.   Fonda FORBES Rim, MD [facility] [phone]  Total time of patient care including pre-visit research, consultation, and documentation greater than 40 minutes

## 2024-08-02 ENCOUNTER — Ambulatory Visit (INDEPENDENT_AMBULATORY_CARE_PROVIDER_SITE_OTHER): Admitting: Vascular Surgery

## 2024-08-02 ENCOUNTER — Ambulatory Visit
Admission: RE | Admit: 2024-08-02 | Discharge: 2024-08-02 | Disposition: A | Discharge status: Home / Self Care | Source: Ambulatory Visit | Attending: Vascular Surgery | Admitting: Vascular Surgery

## 2024-08-02 ENCOUNTER — Encounter: Payer: Self-pay | Admitting: Vascular Surgery

## 2024-08-02 VITALS — BP 175/71 | HR 66 | Temp 98.1°F | Resp 18 | Ht 60.0 in | Wt 163.1 lb

## 2024-08-02 DIAGNOSIS — I6521 Occlusion and stenosis of right carotid artery: Secondary | ICD-10-CM | POA: Insufficient documentation

## 2024-08-02 DIAGNOSIS — I701 Atherosclerosis of renal artery: Secondary | ICD-10-CM | POA: Diagnosis not present

## 2024-08-02 DIAGNOSIS — I6522 Occlusion and stenosis of left carotid artery: Secondary | ICD-10-CM | POA: Diagnosis not present

## 2024-08-02 DIAGNOSIS — Z9862 Peripheral vascular angioplasty status: Secondary | ICD-10-CM

## 2024-08-02 DIAGNOSIS — I7143 Infrarenal abdominal aortic aneurysm, without rupture: Secondary | ICD-10-CM | POA: Diagnosis not present

## 2024-08-02 DIAGNOSIS — I6523 Occlusion and stenosis of bilateral carotid arteries: Secondary | ICD-10-CM | POA: Diagnosis not present

## 2024-08-02 DIAGNOSIS — Z9889 Other specified postprocedural states: Secondary | ICD-10-CM

## 2024-08-03 ENCOUNTER — Other Ambulatory Visit: Payer: Self-pay | Admitting: *Deleted

## 2024-08-03 DIAGNOSIS — Z95828 Presence of other vascular implants and grafts: Secondary | ICD-10-CM

## 2024-08-03 DIAGNOSIS — Z9862 Peripheral vascular angioplasty status: Secondary | ICD-10-CM

## 2024-08-03 DIAGNOSIS — Z9889 Other specified postprocedural states: Secondary | ICD-10-CM

## 2024-08-03 DIAGNOSIS — I739 Peripheral vascular disease, unspecified: Secondary | ICD-10-CM

## 2024-08-06 ENCOUNTER — Ambulatory Visit: Admitting: Urology

## 2024-08-06 ENCOUNTER — Ambulatory Visit: Payer: Self-pay

## 2024-08-06 VITALS — BP 135/57 | HR 69 | Ht 60.0 in | Wt 163.0 lb

## 2024-08-06 DIAGNOSIS — R32 Unspecified urinary incontinence: Secondary | ICD-10-CM

## 2024-08-06 DIAGNOSIS — N3941 Urge incontinence: Secondary | ICD-10-CM | POA: Diagnosis not present

## 2024-08-06 LAB — URINALYSIS, COMPLETE
Bilirubin, UA: NEGATIVE
Glucose, UA: NEGATIVE
Ketones, UA: NEGATIVE
Nitrite, UA: NEGATIVE
Specific Gravity, UA: 1.03 (ref 1.005–1.030)
Urobilinogen, Ur: 0.2 mg/dL (ref 0.2–1.0)
pH, UA: 6 (ref 5.0–7.5)

## 2024-08-06 LAB — MICROSCOPIC EXAMINATION: Epithelial Cells (non renal): >10 /HPF — AB (ref 0–10)

## 2024-08-06 MED ORDER — OXYBUTYNIN CHLORIDE ER 10 MG PO TB24: 10.0000 mg | ORAL_TABLET | Freq: Every day | ORAL | 11 refills | Status: AC

## 2024-08-06 NOTE — Telephone Encounter (Signed)
  FYI Only or Action Required?: Action required by provider: clinical question for provider and update on patient condition. Patient would like to know if she can resume nortriptaline  Patient was last seen in primary care on 07/27/2024 by [name], MD.  Called Nurse Triage reporting Headache.  Symptoms began several days ago.  Interventions attempted: Nothing.  Symptoms are: unchanged.  Triage Disposition: Home Care  Patient/caregiver understands and will follow disposition?: Yes  Copied from CRM [id number]. Topic: Clinical - Red Word Triage >> Aug 06, 2024  3:22 PM Eva FALCON wrote: Red Word that prompted transfer to Nurse Triage: Pt states they stopped Nortiptyline and her head is hurting bad, its cause back pain. Did not see that medication on her medication list? Reason for Disposition  [1] Headache AND [2] has not taken pain medications  Answer Assessment - Initial Assessment Questions Patient would like to know if you would like her to restart Pamelor / nortriptaline  BP 150/77 at time of call   1. LOCATION: Where does it hurt?      All over 2. ONSET: When did the headache start? (e.g., minutes, hours, days)      X 3 days 3. PATTERN: Does the pain come and go, or has it been constant since it started?     constant 4. SEVERITY: How bad is the pain? and What does it keep you from doing?  (e.g., Scale 1-10; mild, moderate, or severe)     9/10 5. RECURRENT SYMPTOM: Have you ever had headaches before? If Yes, ask: When was the last time? and What happened that time?      Yes, history of head ache 6. CAUSE: What do you think is causing the headache?     Stopped Pamelor  7. MIGRAINE: Have you been diagnosed with migraine headaches? If Yes, ask: Is this headache similar?      N/a 8. HEAD INJURY: Has there been any recent injury to your head?      denies 9. OTHER SYMPTOMS: Do you have any other symptoms? (e.g., fever, stiff neck, eye pain, sore  throat, cold symptoms)     denies 10. PREGNANCY: Is there any chance you are pregnant? When was your last menstrual period?       N/a  Protocols used: Headache-A-AH

## 2024-08-06 NOTE — Telephone Encounter (Signed)
 Pt stated that since stopping the nortriptyline  she has had a headache. Pt's bp has been elevated at 150/77. Pt is wanting to know if she can restart the nortriptyline .

## 2024-08-06 NOTE — Telephone Encounter (Signed)
 Pt is aware and gave a verbal understanding.

## 2024-08-06 NOTE — Progress Notes (Signed)
 08/06/2024 1:12 PM   Sabrina [name] 12-11-1940 [id number]  Referring provider: [name], MD [address] [place],  [place] [address]  Chief Complaint  Patient presents with   Follow-up   Urinary Incontinence    HPI: I was consulted to assist the patient is urinary incontinence.  She has urge incontinence.  Sometimes she might leak a small amount with coughing sneezing.  She wears 2 pads a day moderately wet   She voids every 3 hours and every 2 hours at night.  Her flow was good especially when she drinks a lot of fluids.   She thinks she may have had some sort of surgery or stent in her bladder years ago.  She has had a hysterectomy.  On further review it looks like she had a cystoscopy and bilateral stents in 2023 but I did not see it in the local medical record.  Since that time she had a normal renal ultrasound with no stents or hydronephrosis.  She has had a lot of vascular issues.     I would like to see the patient back on Gemtesa  samples and prescription for pelvic exam and cystoscopy in about 6 weeks. She has milder frequency and nocturia. She primarily has urge incontinence. I do not think she needs urodynamics currently    Patient no longer leaking is very pleased.  Still has some urgency.  She did not want cystoscopy.Reassess 4 months durability on Gemtesa  30 x 11 sent to pharmacy   Patient started have incontinence again.  Clinically not infected.  Frequency stable.  Stop Gemtesa . Reassess in 6 weeks on Myrbetriq  50 mg 30 x 11. Call if urine culture is positive   Today Frequency stable.  Last urine culture was positive Clinically not infected today but urine looked positive.  No response to Myrbetriq .  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 b.) TTE 11/16/2021: EF >55%; mild AS (MPG 9 mmHg)   CAD (coronary artery disease)    a.) s/p 3v CABG 03/29/2000   Cardiac arrest [facility])    Carotid artery stenosis    a.) s/p LEFT CEA 09/09/2003. b.) Carotid doppler 88/78/7980: 1-39% LICA, CTO RICA; subclavian stenosis   Cataracts, bilateral    Cervical spondylosis without myelopathy    Chronic diastolic CHF (congestive heart failure), NYHA class 3 (HCC)    a.) TTE 05/27/2016: EF >55%, mild LA enlargement, triv PR, mild MR, mod TR; G3DD. b.) TTE 12/12/2017: EF >55%, mild LVH, BAE, mild MR/PR, mod TR; RVSP 52.8 mmHg. c.) TTE 03/18/2020: EF >55%, BAD, AS (MPG 8.7 mmHg); triv MR, mild TR/PR. d.) TTE 11/16/2021: EF >55%, LVH, G1DD, triv MR, mild PR, mod TR; AS (MPG 9 mmHg); MS (MPG 5 mmHg)   Chronic kidney disease, stage III (moderate) (HCC)    Chronic narcotic use 06/24/2014   Chronic pain syndrome    GERD (gastroesophageal reflux disease)    History of 2019 novel coronavirus disease (COVID-19) 09/22/2021  Hyperlipidemia    Hypertension    Long term current use of antithrombotics/antiplatelets    a.) on daily DAPT therapy (ASA + clopidogrel )   Lumbar stenosis with neurogenic claudication    Mitral stenosis 11/16/2021   a.) TTE 11/16/2021: EF >55%; mod MS (MPG 5 mmHg)   Osteoarthritis of hip    Postoperative wound infection 03/29/2022   Pulmonary hypertension (HCC) 12/12/2017   a.) TTE 12/12/2017: mild; RVSP 52.8 mmHg   PVD (peripheral vascular disease)    Renal artery stenosis    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    Surgical History: Past Surgical History:  Procedure Laterality Date   ABDOMINAL AORTOGRAM W/LOWER EXTREMITY N/A 10/06/2022   Procedure: ABDOMINAL AORTOGRAM W/LOWER EXTREMITY;  Surgeon: Lanis Fonda BRAVO, MD;  Location: [facility] INVASIVE CV LAB;  Service: Cardiovascular;  Laterality: N/A;   ABDOMINAL AORTOGRAM W/LOWER EXTREMITY N/A 05/04/2023   Procedure: ABDOMINAL AORTOGRAM W/LOWER EXTREMITY;  Surgeon: Lanis Fonda BRAVO, MD;  Location: [facility] INVASIVE CV LAB;  Service: Cardiovascular;  Laterality: N/A;   ABDOMINAL AORTOGRAM W/LOWER EXTREMITY N/A 06/20/2024   Procedure: ABDOMINAL AORTOGRAM W/LOWER EXTREMITY;  Surgeon: Lanis Fonda BRAVO, MD;  Location: [facility] INVASIVE CV LAB;  Service: Cardiovascular;  Laterality: N/A;   ABDOMINAL HYSTERECTOMY  1976   CAROTID ARTERY ANGIOPLASTY Left    CAROTID ENDARTERECTOMY Left 09/09/2003   Procedure: CAROTID ENDARTERECTOMY; Location: [facility]; Surgeon: Charlie Lango, MD   COLONOSCOPY WITH PROPOFOL  N/A 08/16/2017   Procedure: COLONOSCOPY WITH PROPOFOL ;  Surgeon: Jinny Carmine, MD;  Location: [facility] ENDOSCOPY;  Service: Endoscopy;  Laterality: N/A;   CORONARY ANGIOPLASTY WITH STENT PLACEMENT  2000   CORONARY ARTERY BYPASS GRAFT N/A 03/29/2000   Procedure: 3v CORONARY ARTERY BYPASS GRAFT; Location: [facility]   CYSTOSCOPY WITH STENT PLACEMENT Bilateral    ENDARTERECTOMY FEMORAL Bilateral 03/17/2022   Procedure: ENDARTERECTOMY FEMORAL ( BILATERAL SFA STENT);  Surgeon: Jama Cordella MATSU, MD;  Location: [facility] ORS;  Service: Vascular;  Laterality: Bilateral;   ENDARTERECTOMY FEMORAL Right 03/30/2022   Procedure: RE-EXPOSURE OF RIGHT COMMON FEMORAL ARTERY, RE-DO RIGHT FEMORAL ENDARTERECTOMY WITH VEIN PATCH;  Surgeon: Lanis Fonda BRAVO, MD;  Location[facility] OR;  Service: Vascular;  Laterality: Right;  WOUND VAC   ESOPHAGOGASTRODUODENOSCOPY (EGD) WITH PROPOFOL  N/A 08/16/2017   Procedure: ESOPHAGOGASTRODUODENOSCOPY (EGD) WITH  PROPOFOL ;  Surgeon: Jinny Carmine, MD;  Location: [facility] ENDOSCOPY;  Service: Endoscopy;  Laterality: N/A;   ESOPHAGOGASTRODUODENOSCOPY (EGD) WITH PROPOFOL  N/A 06/29/2018   Procedure: ESOPHAGOGASTRODUODENOSCOPY (EGD) WITH PROPOFOL ;  Surgeon: Janalyn Keene NOVAK, MD;  Location: [facility] END[facility]COPY;  Service: Endoscopy;  Laterality: N/A;   GROIN DEBRIDEMENT Right 03/30/2022   Procedure: RIGHT GROIN IRRIGATION & DEBRIDEMENT;  Surgeon: Lanis Fonda BRAVO, MD;  Location: [facility] OR;  Service: Vascular;  Laterality: Right;   INSERTION OF ILIAC STENT Left 03/17/2022   Procedure: INSERTION OF ILIAC STENT;  Surgeon: Jama Cordella MATSU, MD;  Location: [facility] ORS;  Service: Vascular;  Laterality: Left;   LAPAROSCOPIC CHOLECYSTECTOMY Left 10/26/1999   Procedure: LAPAROSCOPIC CHOLECYSTECTOMY; Location: [facility]; Surgeon: Unknown Sharps, MD   LEFT HEART CATH AND CORONARY ANGIOGRAPHY N/A 11/18/2021   Procedure: LEFT HEART CATH AND CORONARY ANGIOGRAPHY;  Surgeon: Florencio Cara BIRCH, MD;  Location: [facility] INVASIVE CV LAB;  Service: Cardiovascular;  Laterality: N/A;   LEFT HEART CATH AND CORS/GRAFTS  ANGIOGRAPHY Left 11/19/2002   Procedure: LEFT HEART CATH AND CORS/GRAFTS ANGIOGRAPHY; Location: [facility]; Surgeon: Margie Lovelace, MD   LEFT HEART CATH AND CORS/GRAFTS ANGIOGRAPHY Left 09/17/2003   Procedure: LEFT HEART CATH AND CORS/GRAFTS ANGIOGRAPHY; Location: [facility]; Surgeon: Margie Lovelace, MD   LOWER EXTREMITY ANGIOGRAPHY Right 01/06/2022   Procedure: Lower Extremity Angiography;  Surgeon: Jama Cordella MATSU, MD;  Location: [facility] INVASIVE CV LAB;  Service: Cardiovascular;  Laterality: Right;   PERIPHERAL VASCULAR BALLOON ANGIOPLASTY  10/06/2022   Procedure: PERIPHERAL VASCULAR BALLOON ANGIOPLASTY;  Surgeon: Lanis Fonda BRAVO, MD;  Location: [facility] INVASIVE CV LAB;  Service: Cardiovascular;;  left iliac and bilateral renal   RENAL ARTERY ANGIOPLASTY Bilateral 12/2013   RENAL INTERVENTION Bilateral 06/20/2024   Procedure: RENAL INTERVENTION;  Surgeon:  Lanis Fonda BRAVO, MD;  Location: [facility] INVASIVE CV LAB;  Service: Cardiovascular;  Laterality: Bilateral;   TOE AMPUTATION Right    small toe   TONSILLECTOMY AND ADENOIDECTOMY     TOTAL HIP ARTHROPLASTY Left 2005   TOTAL HIP ARTHROPLASTY Right 2015   UPPER EXTREMITY ANGIOGRAPHY Left 11/23/2021   Procedure: UPPER EXTREMITY ANGIOGRAPHY;  Surgeon: Marea Selinda RAMAN, MD;  Location: [facility] INVASIVE CV LAB;  Service: Cardiovascular;  Laterality: Left;    Home Medications:  Allergies as of 08/06/2024       Reactions   Celexa  [citalopram ] Anaphylaxis   Throat closing    Dilaudid  [hydromorphone  Hcl] Nausea And Vomiting   Effient [prasugrel] Itching   Hydrochlorothiazide  Other (See Comments)   Decreased GFR (Nov 2015)   Liothyronine     Hair fell out, caused headaches    Nsaids    CKD stage III - avoid nephrotoxic drugs   Nubain [nalbuphine Hcl]    Burning sensation in back   Penicillins Itching   Statins Itching        Medication List        Accurate as of August 06, 2024  1:12 PM. If you have any questions, ask your nurse or doctor.          amLODipine  10 MG tablet Commonly known as: NORVASC  Take 1 tablet (10 mg total) by mouth at bedtime.   aspirin  81 MG tablet Take 81 mg by mouth in the morning.   clopidogrel  75 MG tablet Commonly known as: PLAVIX  Take 1 tablet (75 mg total) by mouth at bedtime. To prevent strokes   ezetimibe  10 MG tablet Commonly known as: ZETIA  Take 1 tablet (10 mg total) by mouth at bedtime. For cholesterol   ferrous sulfate  ER 142 (45 Fe) MG Tbcr tablet Commonly known as: Slow Fe Take 1 tablet (45 mg of iron total) by mouth daily.   furosemide  20 MG tablet Commonly known as: LASIX  Take 1 tablet (20 mg total) by mouth daily. For fluid retention   hydrALAZINE  50 MG tablet Commonly known as: APRESOLINE  TAKE 1 TABLET BY MOUTH THREE TIMES DAILY AS NEEDED FOR BLOOD PRESSURE GREATER THAN 150   labetalol  300 MG tablet Commonly known as:  NORMODYNE  TAKE 1 TABLET BY MOUTH TWICE DAILY FOR HIGH BLOOD PRESSURE   losartan  50 MG tablet Commonly known as: COZAAR  Take 1 tablet (50 mg total) by mouth at bedtime. For blood pressure   methocarbamol  500 MG tablet Commonly known as: ROBAXIN  Take 1 tablet (500 mg total) by mouth every 8 (eight) hours as needed for muscle spasms.   mirabegron  ER 50 MG Tb24 tablet Commonly known as: MYRBETRIQ  Take 1 tablet (50 mg total) by mouth daily.   oxyCODONE -acetaminophen  5-325 MG  tablet Commonly known as: Percocet Take 1 tablet by mouth every 8 (eight) hours as needed for severe pain (pain score 7-10). Must last 30 days.   pantoprazole  40 MG tablet Commonly known as: PROTONIX  One tablet upon waking,  take 30 minuts prior to eating   ranolazine  500 MG 12 hr tablet Commonly known as: RANEXA  Take 1 tablet (500 mg total) by mouth 2 (two) times daily. For chronic chest pain        Allergies:  Allergies  Allergen Reactions   Celexa  [Citalopram ] Anaphylaxis    Throat closing    Dilaudid  [Hydromorphone  Hcl] Nausea And Vomiting   Effient [Prasugrel] Itching   Hydrochlorothiazide  Other (See Comments)    Decreased GFR (Nov 2015)   Liothyronine      Hair fell out, caused headaches    Nsaids     CKD stage III - avoid nephrotoxic drugs   Nubain [Nalbuphine Hcl]     Burning sensation in back   Penicillins Itching   Statins Itching    Family History: Family History  Problem Relation Age of Onset   Stroke Mother    Hypertension Mother    Diabetes Mother    Hypertension Father    Heart disease Sister        MI   Multiple sclerosis Daughter    Multiple sclerosis Son    Cerebral aneurysm Son    Seizures Son    Cerebral aneurysm Son    Breast cancer Paternal Aunt 7    Social History:  reports that she quit smoking about 25 years ago. Her smoking use included cigarettes. She has been exposed to tobacco smoke. She has never used smokeless tobacco. She reports that she does not drink  alcohol and does not use drugs.  ROS:                                        Physical Exam: There were no vitals taken for this visit.  Constitutional:  Alert and oriented, No acute distress. HEENT: [place] AT, moist mucus membranes.  Trachea midline, no masses.  Laboratory Data: Lab Results  Component Value Date   WBC 5.9 06/05/2024   HGB 9.9 (L) 06/20/2024   HCT 29.0 (L) 06/20/2024   MCV 90.7 06/05/2024   PLT 266 06/05/2024    Lab Results  Component Value Date   CREATININE 1.50 (H) 06/20/2024    No results found for: PSA  No results found for: TESTOSTERONE  Lab Results  Component Value Date   HGBA1C 5.9 07/19/2016    Urinalysis    Component Value Date/Time   COLORURINE STRAW (A) 06/13/2021 0034   APPEARANCEUR Clear 05/07/2024 1325   LABSPEC 1.004 (L) 06/13/2021 0034   LABSPEC 1.006 03/27/2014 0944   PHURINE 6.0 06/13/2021 0034   GLUCOSEU Negative 05/07/2024 1325   GLUCOSEU 50 mg/dL 93/96/7984 9055   HGBUR SMALL (A) 06/13/2021 0034   BILIRUBINUR Negative 05/07/2024 1325   BILIRUBINUR Negative 03/27/2014 0944   KETONESUR NEGATIVE 06/13/2021 0034   PROTEINUR 2+ (A) 05/07/2024 1325   PROTEINUR 30 (A) 06/13/2021 0034   NITRITE Negative 05/07/2024 1325   NITRITE NEGATIVE 06/13/2021 0034   LEUKOCYTESUR Negative 05/07/2024 1325   LEUKOCYTESUR NEGATIVE 06/13/2021 0034   LEUKOCYTESUR Negative 03/27/2014 0944    Pertinent Imaging: Urine reviewed and sent for culture  Assessment & Plan: Reassess in 6 weeks on oxybutynin.  Discussed third line therapies  at that time.  I think is reasonable not to order urodynamics but this is an option to consider.  Dr. Lazarus is not a urologist We will be interesting to see if her next urine culture is positive  1. Urgency incontinence (Primary)  - Urinalysis, Complete  2. Urinary incontinence, unspecified type  - Urinalysis, Complete   No follow-ups on file.  Glendia DELENA Elizabeth, MD  [facility] [address] [place], [place] [address] [phone]

## 2024-08-08 ENCOUNTER — Ambulatory Visit: Admission: RE | Admit: 2024-08-08 | Source: Ambulatory Visit

## 2024-08-08 ENCOUNTER — Ambulatory Visit
Attending: Student in an Organized Health Care Education/Training Program | Admitting: Student in an Organized Health Care Education/Training Program

## 2024-08-08 DIAGNOSIS — G894 Chronic pain syndrome: Secondary | ICD-10-CM

## 2024-08-08 DIAGNOSIS — M47816 Spondylosis without myelopathy or radiculopathy, lumbar region: Secondary | ICD-10-CM

## 2024-08-08 MED ORDER — FENTANYL CITRATE (PF) 100 MCG/2ML IJ SOLN: 25.0000 ug | INTRAMUSCULAR | Status: DC | PRN

## 2024-08-08 MED ORDER — DEXAMETHASONE SOD PHOSPHATE PF 10 MG/ML IJ SOLN: 20.0000 mg | Freq: Once | INTRAMUSCULAR | Status: DC

## 2024-08-08 MED ORDER — LACTATED RINGERS IV SOLN: Freq: Once | INTRAVENOUS | Status: DC

## 2024-08-08 MED ORDER — LIDOCAINE HCL 2 % IJ SOLN: 20.0000 mL | Freq: Once | INTRAMUSCULAR | Status: DC

## 2024-08-08 MED ORDER — ROPIVACAINE HCL 2 MG/ML IJ SOLN: 18.0000 mL | Freq: Once | INTRAMUSCULAR | Status: DC

## 2024-08-08 MED ORDER — MIDAZOLAM HCL 5 MG/5ML IJ SOLN: 0.5000 mg | Freq: Once | INTRAMUSCULAR | Status: DC

## 2024-08-08 NOTE — Progress Notes (Deleted)
 PROVIDER NOTE: Interpretation of information contained herein should be left to medically-trained personnel. Specific patient instructions are provided elsewhere under Patient Instructions section of medical record. This document was created in part using AI and STT-dictation technology, any transcriptional errors that may result from this process are unintentional.  Patient: Sabrina [name]  Service: E/M Encounter  PCP: [name], MD  DOB: 09-23-1941  DOS: 08/08/2024  Provider: Emmy MARLA Blanch, NP  MRN: [id number]  Delivery: Face-to-face  Specialty: Interventional Pain Management  Type: Established Patient  Setting: Ambulatory outpatient facility  Specialty designation: 09  Referring Prov.: Marcelino Nurse, MD  Location: Outpatient office facility       Purpose:  The patient comes in today for {Blank single:19197::IM therapy,SCS analysis w/ or w/o programming,IT-Pump evaluation and possible adjustment,evaluation of operative/procedural site,evaluation of concerns about side/effect(s) or adverse reaction(s), *** }. Case was discussed with attending physician.  Subjective:  Sabrina [name] is a 83 y.o. year old, female patient, who comes today for a nurse visit complaining of No chief complaint on file. Her last contact with us  was on 07/26/2024. Severity of the pain is described as a  /10.   No notes on file  Objective:  Ms. Frede  vitals were not taken for this visit.  There is no height or weight on file to calculate BMI.  Analgesic:   Hydrocodone -acetaminophen  5-325 mg  every 8 hours as needed for pain. MME=15 (Discontinue 06/26/2024) Oxycodone -acetaminophen  (Percocet) 5-325 mg every 8 hours as needed for pain. (Started on 06/26/2024) Monitoring: [place] PMP: PDMP reviewed during this encounter.       Pharmacotherapy: No side-effects or adverse reactions reported. Compliance: No problems identified. Effectiveness: Clinically acceptable.  Sabrina [name], [place]   07/26/2024  2:06 PM  Sign when Signing Visit Nursing Pain Medication Assessment:  Safety precautions to be maintained throughout the outpatient stay will include: orient to surroundings, keep bed in low position, maintain call bell within reach at all times, provide assistance with transfer out of bed and ambulation.  Medication Inspection Compliance: Pill count conducted under aseptic conditions, in front of the patient. Neither the pills nor the bottle was removed from the patient's sight at any time. Once count was completed pills were immediately returned to the patient in their original bottle.  Medication: Oxycodone /APAP Pill/Patch Count: 88 of 90 pills/patches remain Pill/Patch Appearance: Markings consistent with prescribed medication Bottle Appearance: Standard pharmacy container. Clearly labeled. Filled Date: 09 / 02 / 2025 Last Medication intake:  Today    UDS:  Summary  Date Value Ref Range Status  05/01/2024 FINAL  Final    Comment:    ==================================================================== [employer] Select 13 (MW) ==================================================================== Test                             Result       Flag       Units  Drug Present not Declared for Prescription Verification   Buprenorphine                   11           UNEXPECTED ng/mg creat   Norbuprenorphine               >1852        UNEXPECTED ng/mg creat    Source of buprenorphine  is a scheduled prescription medication.    Norbuprenorphine is an expected metabolite of buprenorphine .  Drug Absent but Declared  for Prescription Verification   Hydrocodone                     Not Detected UNEXPECTED ng/mg creat ==================================================================== Test                      Result    Flag   Units      Ref Range   Creatinine              54               mg/dL      >=79 ==================================================================== Declared  Medications:  The flagging and interpretation on this report are based on the  following declared medications.  Unexpected results may arise from  inaccuracies in the declared medications.   **Note: The testing scope of this panel includes these medications:   Hydrocodone  (Norco)   **Note: The testing scope of this panel does not include the  following reported medications:   Acetaminophen  (Norco)  Amlodipine  (Norvasc )  Aspirin   Clopidogrel  (Plavix )  Duloxetine  (Cymbalta )  Ezetimibe  (Zetia )  Furosemide  (Lasix )  Hydralazine  (Apresoline )  Hydroxyzine  (Vistaril )  Iron  Normodyne   Topical  Vitamin C  ==================================================================== For clinical consultation, please call [phone]. ====================================================================     No results found for: CBDTHCR No results found for: D8THCCBX No results found for: D9THCCBX  ROS  Constitutional: Denies any fever or chills Gastrointestinal: No reported hemesis, hematochezia, vomiting, or acute GI distress Musculoskeletal: Lower back pain Neurological: No reported episodes of acute onset apraxia, aphasia, dysarthria, agnosia, amnesia, paralysis, loss of coordination, or loss of consciousness  Medication Review  amLODipine , aspirin , clopidogrel , ezetimibe , ferrous sulfate  ER, furosemide , hydrALAZINE , labetalol , losartan , methocarbamol , mirabegron  ER, nortriptyline , oxyCODONE -acetaminophen , pantoprazole , and ranolazine   History Review  Allergy: [name] is allergic to celexa  [citalopram ], dilaudid  [hydromorphone  hcl], effient [prasugrel], hydrochlorothiazide , liothyronine , nsaids, nubain [nalbuphine hcl], penicillins, and statins. Drug: [name]  reports no history of drug use. Alcohol:  reports no history of alcohol use. Tobacco:  reports that she quit smoking about 25 years ago. Her smoking use included cigarettes. She has been exposed to tobacco  smoke. She has never used smokeless tobacco. Social: [name]  reports that she quit smoking about 25 years ago. Her smoking use included cigarettes. She has been exposed to tobacco smoke. She has never used smokeless tobacco. She reports that she does not drink alcohol and does not use drugs. 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), Amputation of fifth toe, right, traumatic, subsequent encounter (06/18/2019), Anemia of chronic kidney failure, Anxiety, Aortic stenosis (03/18/2020), CAD (coronary artery disease), Cardiac arrest [facility]), Carotid artery stenosis, Cataracts, bilateral, Cervical spondylosis without myelopathy, Chronic diastolic CHF (congestive heart failure), NYHA class 3 (HCC), Chronic kidney disease, stage III (moderate) (HCC), Chronic narcotic use (06/24/2014), Chronic pain syndrome, GERD (gastroesophageal reflux disease), History of 2019 novel coronavirus disease (COVID-19) (09/22/2021), Hyperlipidemia, Hypertension, Long term current use of antithrombotics/antiplatelets, Lumbar stenosis with neurogenic claudication, Mitral stenosis (11/16/2021), Osteoarthritis of hip, Postoperative wound infection (03/29/2022), Pulmonary hypertension (HCC) (12/12/2017), PVD (peripheral vascular disease), Renal artery stenosis, 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 Surgical: Ms. Collazos  has a past surgical history that includes Laparoscopic cholecystectomy (Left, 10/26/1999); Tonsillectomy and adenoidectomy; Abdominal hysterectomy (1976); Total hip arthroplasty (Left, 2005); Carotid angioplasty (Left); Coronary angioplasty with stent (2000); Toe amputation (Right); Cystoscopy with stent placement (Bilateral); Total hip arthroplasty (Right, 2015); Coronary artery bypass  graft (N/A, 03/29/2000); Carotid endarterectomy (Left, 09/09/2003); Renal artery angioplasty (Bilateral, 12/2013); Colonoscopy  with propofol  (N/A, 08/16/2017); Esophagogastroduodenoscopy (egd) with propofol  (N/A, 08/16/2017); Esophagogastroduodenoscopy (egd) with propofol  (N/A, 06/29/2018); LEFT HEART CATH AND CORONARY ANGIOGRAPHY (N/A, 11/18/2021); Upper Extremity Angiography (Left, 11/23/2021); Lower Extremity Angiography (Right, 01/06/2022); LEFT HEART CATH AND CORS/GRAFTS ANGIOGRAPHY (Left, 11/19/2002); LEFT HEART CATH AND CORS/GRAFTS ANGIOGRAPHY (Left, 09/17/2003); Endarterectomy femoral (Bilateral, 03/17/2022); Insertion of iliac stent (Left, 03/17/2022); Groin debridement (Right, 03/30/2022); Endarterectomy femoral (Right, 03/30/2022); ABDOMINAL AORTOGRAM W/LOWER EXTREMITY (N/A, 10/06/2022); PERIPHERAL VASCULAR BALLOON ANGIOPLASTY (10/06/2022); ABDOMINAL AORTOGRAM W/LOWER EXTREMITY (N/A, 05/04/2023); ABDOMINAL AORTOGRAM W/LOWER EXTREMITY (N/A, 06/20/2024); and RENAL INTERVENTION (Bilateral, 06/20/2024). Family: family history includes Breast cancer (age of onset: 11) in her paternal aunt; Cerebral aneurysm in her son and son; Diabetes in her mother; Heart disease in her sister; Hypertension in her father and mother; Multiple sclerosis in her daughter and son; Seizures in her son; Stroke in her mother.  Laboratory Chemistry Profile   Renal Lab Results  Component Value Date   BUN 18 06/20/2024   CREATININE 1.50 (H) 06/20/2024   LABCREA 86 03/06/2018   GFR 32.48 (L) 08/25/2023   GFRAA 50 (L) 03/10/2020   GFRNONAA 28 (L) 06/05/2024    Hepatic Lab Results  Component Value Date   AST 19 06/05/2024   ALT 13 06/05/2024   ALBUMIN  3.9 06/05/2024   ALKPHOS 86 06/05/2024   LIPASE 16.0 07/06/2017    Electrolytes Lab Results  Component Value Date   NA 139 06/20/2024   K 3.6 06/20/2024   CL 107 06/20/2024   CALCIUM  9.5 06/05/2024   MG 2.5 (H) 03/31/2022   PHOS 3.8 09/23/2022    Bone No results found for: VD25OH, VD125OH2TOT, CI6874NY7, CI7874NY7, 25OHVITD1, 25OHVITD2, 25OHVITD3, TESTOFREE, TESTOSTERONE   Inflammation (CRP: Acute Phase) (ESR: Chronic Phase) Lab Results  Component Value Date   CRP <1.0 02/17/2023   ESRSEDRATE 53 (H) 02/17/2023   LATICACIDVEN 1.0 03/30/2022         Note: Above Lab results reviewed.  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  Ultrasound-guided micropuncture access of the right common femoral  artery in retrograde fashion 2.  Aortogram 3.  Selective imaging of the left renal artery, selective imaging of the  right renal artery 4.  Drug-coated balloon angioplasty left renal artery 4 x 40 mm balloon 5.  Drug-coated balloon angioplasty of the right renal artery 5 x 40 mm  balloon 6.  Dedicated imaging of the bilateral common, external iliac arteries 7.  Device assisted closure-Mynx 8.  Moderate sedation time 55 minutes, contrast volume 75 mL  Indications:   Patient is an 83 year old female with multiple endovascular inventions at  outside hospital whom I met several years ago.  I have performed bilateral renal balloon angioplasty, right EIA  angioplasty in 2023, followed by balloon angioplasty of the iliac arteries  for in-stent restenosis as well.  Imaging today demonstrated concern for left renal artery stenosis with  velocities greater than 400.  I was concerned that the in-stent restenosis  could lead to thrombosis of the stent.  After discussing the risks and  benefits of diagnostic angiography with possible intervention, Margrette  elected to proceed.  Findings:   Bilateral renal arteries with greater than 80% flow-limiting stenosis at  the mid stent Widely patent infrarenal abdominal  aorta.  No flow-limiting stenosis  identified in the aortoiliac segments bilaterally.    Procedure:   The patient was identified in the holding area and taken to  room 8.  The patient was then placed supine on the table and prepped and  draped in the usual sterile fashion.  A time out was called.  Ultrasound  was used to evaluate the right common femoral artery.  It was patent .  A  digital ultrasound image was acquired.  A micropuncture needle was used to  access the right common femoral artery under ultrasound guidance.  An 018  wire was advanced without resistance and a micropuncture sheath was  placed.  The 018 wire was removed and a benson wire was placed.  The  micropuncture sheath was exchanged for a 5 french sheath.  An omniflush  catheter was advanced over the wire to the level of T12, and abdominal  angiogram followed.  See results above.  I elected to intervene on the  bilateral renal arteries.  A short 6 French sheath was placed, and the  patient heparinized.  Next, a 6 [place] IM guide catheter was brought onto  the field and positioned at the level of the renal arteries.  I began with  the left renal artery.  The IM catheter was placed in the stent and  diagnostic angiogram followed.  This was cannulated using a 0.014 wire  followed by 4 x 40 mm drug-coated balloon.  We did not have a smaller size  in regards to length.  This was inflated for 2 minutes with follow-up  angiography demonstrating excellent result.  There was no residual  flow-limiting stenosis.  Next, the wire was removed, and using the IM  catheter, I was able to cannulate the right renal artery.  Diagnostic  angiogram followed, and in similar fashion, a 5 x 40 mm balloon was  brought onto the field, laid across the stents, and inflated.  Follow-up  angiography demonstrated excellent result with resolution of flow-limiting  stenosis.  Angiography of the infrarenal aorta and iliacs followed.  See results  above.  No intervention was necessary.  Patient was closed using a minx device without  issue.  Impression: Successful bilateral drug-coated balloon angioplasty of the  renal arteries for in-stent restenosis.  Fonda FORBES Rim MD [facility] Office: [phone] Note: Reviewed        Physical Exam  Vitals: BP (!) 141/53 (BP Location: Left Arm, Patient Position: Sitting)   Pulse 63   Temp (!) 97.5 F (36.4 C) (Temporal)   Resp 18   Ht 5' (1.524 m)   Wt 163 lb (73.9 kg)   SpO2 98%   BMI 31.83 kg/m  BMI: Estimated body mass index is 31.83 kg/m as calculated from the following:   Height as of this encounter: 5' (1.524 m).   Weight as of this encounter: 163 lb (73.9 kg). Ideal: Ideal body weight: 45.5 kg (100 lb 4.9 oz) Adjusted ideal body weight: 56.9 kg (125 lb 6.2 oz) General appearance: Well nourished, well developed, and well hydrated. In no apparent acute distress Mental status: Alert, oriented x 3 (person, place, & time)       Respiratory: No evidence of acute respiratory distress Eyes: PERLA  Musculoskeletal: + LBP Assessment   Diagnosis Status  1. Medication management   2. Chronic pain syndrome   3. Lumbar facet arthropathy   4. Lumbar spondylosis    Controlled Controlled Controlled   Updated  Problems: No problems updated.  Plan of Care  Problem-specific:  Assessment and Plan Medication management: Patient's pain is well-controlled with Percocet 5-325 mg, will continue on current medication regimen.  Prescribing drug monitoring (PDMP) reviewed; findings consistent with the use of prescribed medication and no evidence of narcotic misuse or abuse.  Urine drug screening (UDS) up-to-date.  Schedule follow-up in 30 days for medication management.   Chronic pain syndrome: Patient continues experiencing low back pain.  The patient has undergone multiple interventional therapies in the past.  She is scheduled for a lumbar radiofrequency ablation (RFA) with Dr. Marcelino  Ms. Braylynn A Argyle has a current medication list  which includes the following long-term medication(s): amlodipine , ezetimibe , ferrous sulfate  er, furosemide , hydralazine , labetalol , losartan , mirabegron  er, nortriptyline , pantoprazole , and ranolazine .  Pharmacotherapy (Medications Ordered): Meds ordered this encounter  Medications   oxyCODONE -acetaminophen  (PERCOCET) 5-325 MG tablet    Sig: Take 1 tablet by mouth every 8 (eight) hours as needed for severe pain (pain score 7-10). Must last 30 days.    Dispense:  90 tablet    Refill:  0    Chronic Pain: STOP Act (Not applicable) Fill 1 day early if closed on refill date. Avoid benzodiazepines within 8 hours of opioids   Orders:  No orders of the defined types were placed in this encounter.    Allergies:  Patient is allergic to celexa  [citalopram ], dilaudid  [hydromorphone  hcl], effient [prasugrel], hydrochlorothiazide , liothyronine , nsaids, nubain [nalbuphine hcl], penicillins, and statins.  Labs:  Lab Results  Component Value Date   BUN 18 06/20/2024   CREATININE 1.50 (H) 06/20/2024   GFRAA 50 (L) 03/10/2020   GFRNONAA 28 (L) 06/05/2024    Assessment:  Diagnoses of Lumbar facet arthropathy, Lumbar spondylosis, and Chronic pain syndrome were pertinent to this visit.  Attestation: Medical screening examination/treatment/procedure(s) were performed by non-physician practitioner and as supervising physician I was immediately available for consultation/collaboration.  Plan of Care (POC)  Orders:  Orders Placed This Encounter  Procedures   DG PAIN CLINIC C-ARM 1-60 MIN NO REPORT    Intraoperative interpretation by procedural physician at [facility].    Standing Status:   Standing    Number of Occurrences:   1    Reason for exam::   Assistance in needle guidance and placement for procedures requiring needle placement in or near specific anatomical locations not easily accessible without such assistance.     Hydrocodone -acetaminophen  5-325 mg  every 8 hours as needed  for pain. MME=15 (Discontinue 06/26/2024) Oxycodone -acetaminophen  (Percocet) 5-325 mg every 8 hours as needed for pain. (Started on 06/26/2024) Monitoring: [place] PMP: PDMP reviewed during this encounter.       Pharmacotherapy: No side-effects or adverse reactions reported. Compliance: No problems identified. Effectiveness: Clinically acceptable.  Sabrina [name], [place]  07/26/2024  2:06 PM  Sign when Signing Visit Nursing Pain Medication Assessment:  Safety precautions to be maintained throughout the outpatient stay will include: orient to surroundings, keep bed in low position, maintain call bell within reach at all times, provide assistance with transfer out of bed and ambulation.  Medication Inspection Compliance: Pill count conducted under aseptic conditions, in front of the patient. Neither the pills nor the bottle was removed from the patient's sight at any time. Once count was completed pills were immediately returned to the patient in their original bottle.  Medication: Oxycodone /APAP Pill/Patch Count: 88 of 90 pills/patches remain Pill/Patch Appearance: Markings consistent with prescribed medication Bottle Appearance: Standard pharmacy container. Clearly labeled. Filled Date: 09 /  02 / 2025 Last Medication intake:  Today    UDS:  Summary  Date Value Ref Range Status  05/01/2024 FINAL  Final    Comment:    ==================================================================== [employer] Select 13 (MW) ==================================================================== Test                             Result       Flag       Units  Drug Present not Declared for Prescription Verification   Buprenorphine                   11           UNEXPECTED ng/mg creat   Norbuprenorphine               >1852        UNEXPECTED ng/mg creat    Source of buprenorphine  is a scheduled prescription medication.    Norbuprenorphine is an expected metabolite of buprenorphine .  Drug Absent but Declared for  Prescription Verification   Hydrocodone                     Not Detected UNEXPECTED ng/mg creat ==================================================================== Test                      Result    Flag   Units      Ref Range   Creatinine              54               mg/dL      >=79 ==================================================================== Declared Medications:  The flagging and interpretation on this report are based on the  following declared medications.  Unexpected results may arise from  inaccuracies in the declared medications.   **Note: The testing scope of this panel includes these medications:   Hydrocodone  (Norco)   **Note: The testing scope of this panel does not include the  following reported medications:   Acetaminophen  (Norco)  Amlodipine  (Norvasc )  Aspirin   Clopidogrel  (Plavix )  Duloxetine  (Cymbalta )  Ezetimibe  (Zetia )  Furosemide  (Lasix )  Hydralazine  (Apresoline )  Hydroxyzine  (Vistaril )  Iron  Normodyne   Topical  Vitamin C  ==================================================================== For clinical consultation, please call [phone]. ====================================================================     No results found for: CBDTHCR No results found for: D8THCCBX No results found for: D9THCCBX  ROS  Constitutional: Denies any fever or chills Gastrointestinal: No reported hemesis, hematochezia, vomiting, or acute GI distress Musculoskeletal: Lower back pain Neurological: No reported episodes of acute onset apraxia, aphasia, dysarthria, agnosia, amnesia, paralysis, loss of coordination, or loss of consciousness  Medication Review  amLODipine , aspirin , clopidogrel , ezetimibe , ferrous sulfate  ER, furosemide , hydrALAZINE , labetalol , losartan , methocarbamol , mirabegron  ER, nortriptyline , oxyCODONE -acetaminophen , pantoprazole , and ranolazine   History Review  Allergy: Ms. Inzunza is allergic to celexa  [citalopram ], dilaudid   [hydromorphone  hcl], effient [prasugrel], hydrochlorothiazide , liothyronine , nsaids, nubain [nalbuphine hcl], penicillins, and statins. Drug: Ms. Cheeks  reports no history of drug use. Alcohol:  reports no history of alcohol use. Tobacco:  reports that she quit smoking about 25 years ago. Her smoking use included cigarettes. She has been exposed to tobacco smoke. She has never used smokeless tobacco. Social: Ms. Stenglein  reports that she quit smoking about 25 years ago. Her smoking use included cigarettes. She has been exposed to tobacco smoke. She has never used smokeless tobacco. She reports that she does not drink alcohol and does not use drugs. 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 Surgical: Ms. Flitton  has a past surgical history that includes Laparoscopic cholecystectomy (Left, 10/26/1999); Tonsillectomy and adenoidectomy; Abdominal hysterectomy  (1976); Total hip arthroplasty (Left, 2005); Carotid angioplasty (Left); Coronary angioplasty with stent (2000); Toe amputation (Right); Cystoscopy with stent placement (Bilateral); Total hip arthroplasty (Right, 2015); Coronary artery bypass graft (N/A, 03/29/2000); Carotid endarterectomy (Left, 09/09/2003); Renal artery angioplasty (Bilateral, 12/2013); Colonoscopy with propofol  (N/A, 08/16/2017); Esophagogastroduodenoscopy (egd) with propofol  (N/A, 08/16/2017); Esophagogastroduodenoscopy (egd) with propofol  (N/A, 06/29/2018); LEFT HEART CATH AND CORONARY ANGIOGRAPHY (N/A, 11/18/2021); Upper Extremity Angiography (Left, 11/23/2021); Lower Extremity Angiography (Right, 01/06/2022); LEFT HEART CATH AND CORS/GRAFTS ANGIOGRAPHY (Left, 11/19/2002); LEFT HEART CATH AND CORS/GRAFTS ANGIOGRAPHY (Left, 09/17/2003); Endarterectomy femoral (Bilateral, 03/17/2022); Insertion of iliac stent (Left, 03/17/2022); Groin debridement (Right, 03/30/2022); Endarterectomy femoral (Right, 03/30/2022); ABDOMINAL AORTOGRAM W/LOWER EXTREMITY (N/A, 10/06/2022); PERIPHERAL VASCULAR BALLOON ANGIOPLASTY (10/06/2022); ABDOMINAL AORTOGRAM W/LOWER EXTREMITY (N/A, 05/04/2023); ABDOMINAL AORTOGRAM W/LOWER EXTREMITY (N/A, 06/20/2024); and RENAL INTERVENTION (Bilateral, 06/20/2024). Family: family history includes Breast cancer (age of onset: 32) in her paternal aunt; Cerebral aneurysm in her son and son; Diabetes in her mother; Heart disease in her sister; Hypertension in her father and mother; Multiple sclerosis in her daughter and son; Seizures in her son; Stroke in her mother.  Laboratory Chemistry Profile   Renal Lab Results  Component Value Date   BUN 18 06/20/2024   CREATININE 1.50 (H) 06/20/2024   LABCREA 86 03/06/2018   GFR 32.48 (L) 08/25/2023   GFRAA 50 (L) 03/10/2020   GFRNONAA 28 (L) 06/05/2024    Hepatic Lab Results  Component Value Date   AST 19 06/05/2024   ALT 13 06/05/2024   ALBUMIN  3.9 06/05/2024   ALKPHOS 86 06/05/2024    LIPASE 16.0 07/06/2017    Electrolytes Lab Results  Component Value Date   NA 139 06/20/2024   K 3.6 06/20/2024   CL 107 06/20/2024   CALCIUM  9.5 06/05/2024   MG 2.5 (H) 03/31/2022   PHOS 3.8 09/23/2022    Bone No results found for: VD25OH, VD125OH2TOT, CI6874NY7, CI7874NY7, 25OHVITD1, 25OHVITD2, 25OHVITD3, TESTOFREE, TESTOSTERONE  Inflammation (CRP: Acute Phase) (ESR: Chronic Phase) Lab Results  Component Value Date   CRP <1.0 02/17/2023   ESRSEDRATE 53 (H) 02/17/2023   LATICACIDVEN 1.0 03/30/2022         Note: Above Lab results reviewed.  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  Ultrasound-guided micropuncture access of the right common femoral  artery in retrograde fashion 2.  Aortogram 3.  Selective imaging of the left renal artery, selective imaging of the  right renal artery 4.  Drug-coated balloon angioplasty left renal artery 4 x 40 mm balloon 5.  Drug-coated balloon angioplasty of the right renal artery 5 x 40 mm  balloon 6.  Dedicated imaging of the bilateral common, external iliac arteries 7.  Device assisted closure-Mynx 8.  Moderate sedation time 55 minutes, contrast volume 75 mL  Indications:   Patient is an 83 year old female with multiple endovascular inventions at  outside hospital whom I met several years ago.  I have performed bilateral renal balloon angioplasty, right EIA  angioplasty in 2023, followed by balloon angioplasty of the iliac arteries  for in-stent restenosis as well.  Imaging today demonstrated concern for left renal artery stenosis with  velocities greater than 400.  I was concerned that the in-stent restenosis  could lead to thrombosis of the stent.  After  discussing the risks and  benefits of diagnostic angiography with possible intervention, Shanetra  elected to proceed.  Findings:   Bilateral renal arteries with greater than 80% flow-limiting stenosis at  the mid stent Widely patent infrarenal abdominal aorta.  No flow-limiting stenosis  identified in the aortoiliac segments bilaterally.    Procedure:  The patient was identified in the holding area and taken to  room 8.  The patient was then placed supine on the table and prepped and  draped in the usual sterile fashion.  A time out was called.  Ultrasound  was used to evaluate the right common femoral artery.  It was patent .  A  digital ultrasound image was acquired.  A micropuncture needle was used to  access the right common femoral artery under ultrasound guidance.  An 018  wire was advanced without resistance and a micropuncture sheath was  placed.  The 018 wire was removed and a benson wire was placed.  The  micropuncture sheath was exchanged for a 5 french sheath.  An omniflush  catheter was advanced over the wire to the level of T12, and abdominal  angiogram followed.  See results above.  I elected to intervene on the  bilateral renal arteries.  A short 6 French sheath was placed, and the  patient heparinized.  Next, a 6 [place] IM guide catheter was brought onto  the field and positioned at the level of the renal arteries.  I began with  the left renal artery.  The IM catheter was placed in the stent and  diagnostic angiogram followed.  This was cannulated using a 0.014 wire  followed by 4 x 40 mm drug-coated balloon.  We did not have a smaller size  in regards to length.  This was inflated for 2 minutes with follow-up  angiography demonstrating excellent result.  There was no residual  flow-limiting stenosis.  Next, the wire was removed, and using the IM  catheter, I was able to cannulate the right renal artery.  Diagnostic  angiogram followed, and in similar fashion, a 5 x  40 mm balloon was  brought onto the field, laid across the stents, and inflated.  Follow-up  angiography demonstrated excellent result with resolution of flow-limiting  stenosis.  Angiography of the infrarenal aorta and iliacs followed.  See results  above.  No intervention was necessary.  Patient was closed using a minx device without issue.  Impression: Successful bilateral drug-coated balloon angioplasty of the  renal arteries for in-stent restenosis.  Fonda FORBES Rim MD [facility] [employer] Office: [phone] Note: Reviewed        Physical Exam  Vitals: BP (!) 141/53 (BP  Location: Left Arm, Patient Position: Sitting)   Pulse 63   Temp (!) 97.5 F (36.4 C) (Temporal)   Resp 18   Ht 5' (1.524 m)   Wt 163 lb (73.9 kg)   SpO2 98%   BMI 31.83 kg/m  BMI: Estimated body mass index is 31.83 kg/m as calculated from the following:   Height as of this encounter: 5' (1.524 m).   Weight as of this encounter: 163 lb (73.9 kg). Ideal: Ideal body weight: 45.5 kg (100 lb 4.9 oz) Adjusted ideal body weight: 56.9 kg (125 lb 6.2 oz) General appearance: Well nourished, well developed, and well hydrated. In no apparent acute distress Mental status: Alert, oriented x 3 (person, place, & time)       Respiratory: No evidence of acute respiratory distress Eyes: PERLA  Musculoskeletal: + LBP Assessment   Diagnosis Status  1. Medication management   2. Chronic pain syndrome   3. Lumbar facet arthropathy   4. Lumbar spondylosis    Controlled Controlled Controlled   Updated Problems: No problems updated.  Plan of Care  Problem-specific:  Assessment and Plan Medication management: Patient's pain is well-controlled with Percocet 5-325 mg, will continue on current medication regimen.  Prescribing drug monitoring (PDMP) reviewed; findings consistent with the use of prescribed medication and no evidence of narcotic misuse or abuse.  Urine drug screening (UDS)  up-to-date.  Schedule follow-up in 30 days for medication management.   Chronic pain syndrome: Patient continues experiencing low back pain.  The patient has undergone multiple interventional therapies in the past.  She is scheduled for a lumbar radiofrequency ablation (RFA) with Dr. Marcelino  Ms. Talisa A Dillow has a current medication list which includes the following long-term medication(s): amlodipine , ezetimibe , ferrous sulfate  er, furosemide , hydralazine , labetalol , losartan , mirabegron  er, nortriptyline , pantoprazole , and ranolazine .  Pharmacotherapy (Medications Ordered): Meds ordered this encounter  Medications   oxyCODONE -acetaminophen  (PERCOCET) 5-325 MG tablet    Sig: Take 1 tablet by mouth every 8 (eight) hours as needed for severe pain (pain score 7-10). Must last 30 days.    Dispense:  90 tablet    Refill:  0    Chronic Pain: STOP Act (Not applicable) Fill 1 day early if closed on refill date. Avoid benzodiazepines within 8 hours of opioids   Orders:  No orders of the defined types were placed in this encounter.    Medications ordered for procedure: Meds ordered this encounter  Medications   lidocaine  (XYLOCAINE ) 2 % (with pres) injection 400 mg   lactated ringers  infusion   midazolam  (VERSED ) 5 MG/5ML injection 0.5-2 mg    Make sure Flumazenil is available in the pyxis when using this medication. If oversedation occurs, administer 0.2 mg IV over 15 sec. If after 45 sec no response, administer 0.2 mg again over 1 min; may repeat at 1 min intervals; not to exceed 4 doses (1 mg)   fentaNYL  (SUBLIMAZE ) injection 25-50 mcg    Make sure Narcan is available in the pyxis when using this medication. In the event of respiratory depression (RR< 8/min): Titrate NARCAN (naloxone) in increments of 0.1 to 0.2 mg IV at 2-3 minute intervals, until desired degree of reversal.   ropivacaine  (PF) 2 mg/mL (0.2%) (NAROPIN ) injection 18 mL   dexamethasone  (DECADRON ) injection 20 mg    Medications administered: Hiliana A. Browder had no medications administered during this visit.  See the medical record for exact dosing, route, and time of administration.    {There is no content from  the last Plan section.}    Follow-up plan:   Return in about 8 weeks (around 10/03/2024) for PPE, F2F, Octavio Matheney.     Recent Visits Date Type Provider Dept  07/26/24 Office Visit Tyrika Newman K, NP [facility]  07/10/24 Office Visit Marcelino Nurse, MD [facility]  06/26/24 Office Visit Tiona Ruane K, NP [facility]  05/29/24 Office Visit Jalayiah Bibian K, NP [facility]  Showing recent visits within past 90 days and meeting all other requirements Today's Visits Date Type Provider Dept  08/08/24 Procedure visit Marcelino Nurse, MD [facility]  Showing today's visits and meeting all other requirements Future Appointments Date Type Provider Dept  08/23/24 Appointment Shaterica Mcclatchy K, NP [facility]  Showing future appointments within next 90 days and meeting all other requirements   Disposition: {Blank single:19197::Transfer back to floor,Discharge home under the care of a responsible, capable, adult driver,Discharge home}  Discharge (Date  Time): 08/08/2024;   hrs.   Primary Care Physician: [name], MD Location: [facility] Note by: Harlon Kutner K Kolten Ryback, NP (TTS technology used. I apologize for any typographical errors that were not detected and corrected.) Date: 08/08/2024; Time: 8:39 AM  Disclaimer:  Medicine is not an Visual merchandiser. The only guarantee in medicine is that nothing is guaranteed. It is important to note that the decision to proceed with this intervention was based on the information collected from the patient. The Data and conclusions were drawn from the patient's questionnaire, the interview, and the physical examination. Because the information was provided in large part by  the patient, it cannot be guaranteed that it has not been purposely or unconsciously manipulated. Every effort has been made to obtain as much relevant data as possible for this evaluation. It is important to note that the conclusions that lead to this procedure are derived in large part from the available data. Always take into account that the treatment will also be dependent on availability of resources and existing treatment guidelines, considered by other Pain Management Practitioners as being common knowledge and practice, at the time of the intervention. For Medico-Legal purposes, it is also important to point out that variation in procedural techniques and pharmacological choices are the acceptable norm. The indications, contraindications, technique, and results of the above procedure should only be interpreted and judged by a Board-Certified Interventional Pain Specialist with extensive familiarity and expertise in the same exact procedure and technique.

## 2024-08-08 NOTE — Progress Notes (Signed)
 Patient forgot to stop her Plavix .  She was somewhat frustrated with this.  Her last visit was a virtual visit and it is documented that she should stop her Plavix  7 days prior to her RFA.  We will get her rescheduled for Monday, November 3 at 9 AM for RFA she is instructed to stop her Plavix  on October 26.

## 2024-08-08 NOTE — Patient Instructions (Addendum)
 Stop Plavix  7 days prior Last dose Sun Oct 26  ______________________________________________________________________    Preparing for your procedure  Appointments: If you think you may not be able to keep your appointment, call 24-48 hours in advance to cancel. We need time to make it available to others.  Procedure visits are for procedures only. During your procedure appointment there will be: NO Prescription Refills*. NO medication changes or discussions*. NO discussion of disability issues*. NO unrelated pain problem evaluations*. NO evaluations to order other pain procedures*. *These will be addressed at a separate and distinct evaluation encounter on the provider's evaluation schedule and not during procedure days.  Instructions: Food intake: Avoid eating anything solid for at least 8 hours prior to your procedure. Clear liquid intake: You may take clear liquids such as water up to 2 hours prior to your procedure. (No carbonated drinks. No soda.) Transportation: Unless otherwise stated by your physician, bring a driver. (Driver cannot be a Market researcher, Pharmacist, community, or any other form of public transportation.) Morning Medicines: Except for blood thinners, take all of your other morning medications with a sip of water. Make sure to take your heart and blood pressure medicines. If your blood pressure's lower number is above 100, the case will be rescheduled. Blood thinners: Make sure to stop your blood thinners as instructed.  If you take a blood thinner, but were not instructed to stop it, call our office [phone] and ask to talk to a nurse. Not stopping a blood thinner prior to certain procedures could lead to serious complications. Diabetics on insulin : Notify the staff so that you can be scheduled 1st case in the morning. If your diabetes requires high dose insulin , take only  of your normal insulin  dose the morning of the procedure and notify the staff that you have done so. Preventing  infections: Shower with an antibacterial soap the morning of your procedure.  Build-up your immune system: Take 1000 mg of Vitamin C  with every meal (3 times a day) the day prior to your procedure. Antibiotics: Inform the nursing staff if you are taking any antibiotics or if you have any conditions that may require antibiotics prior to procedures. (Example: recent joint implants)   Pregnancy: If you are pregnant make sure to notify the nursing staff. Not doing so may result in injury to the fetus, including death.  Sickness: If you have a cold, fever, or any active infections, call and cancel or reschedule your procedure. Receiving steroids while having an infection may result in complications. Arrival: You must be in the facility at least 30 minutes prior to your scheduled procedure. Tardiness: Your scheduled time is also the cutoff time. If you do not arrive at least 15 minutes prior to your procedure, you will be rescheduled.  Children: Do not bring any children with you. Make arrangements to keep them home. Dress appropriately: There is always a possibility that your clothing may get soiled. Avoid long dresses. Valuables: Do not bring any jewelry or valuables.  Reasons to call and reschedule or cancel your procedure: (Following these recommendations will minimize the risk of a serious complication.) Surgeries: Avoid having procedures within 2 weeks of any surgery. (Avoid for 2 weeks before or after any surgery). Flu Shots: Avoid having procedures within 2 weeks of a flu shots or . (Avoid for 2 weeks before or after immunizations). Barium: Avoid having a procedure within 7-10 days after having had a radiological study involving the use of radiological contrast. (Myelograms, Barium swallow or enema  study). Heart attacks: Avoid any elective procedures or surgeries for the initial 6 months after a Myocardial Infarction (Heart Attack). Blood thinners: It is imperative that you stop these medications  before procedures. Let us  know if you if you take any blood thinner.  Infection: Avoid procedures during or within two weeks of an infection (including chest colds or gastrointestinal problems). Symptoms associated with infections include: Localized redness, fever, chills, night sweats or profuse sweating, burning sensation when voiding, cough, congestion, stuffiness, runny nose, sore throat, diarrhea, nausea, vomiting, cold or Flu symptoms, recent or current infections. It is specially important if the infection is over the area that we intend to treat. Heart and lung problems: Symptoms that may suggest an active cardiopulmonary problem include: cough, chest pain, breathing difficulties or shortness of breath, dizziness, ankle swelling, uncontrolled high or unusually low blood pressure, and/or palpitations. If you are experiencing any of these symptoms, cancel your procedure and contact your primary care physician for an evaluation.  Remember:  Regular Business hours are:  Monday to Thursday 8:00 AM to 4:00 PM  Provider's Schedule: Eric Como, MD:  Procedure days: Tuesday and Thursday 7:30 AM to 4:00 PM  Wallie Sherry, MD:  Procedure days: Monday and Wednesday 7:30 AM to 4:00 PM Last  Updated: 10/04/2023 ______________________________________________________________________

## 2024-08-09 LAB — CULTURE, URINE COMPREHENSIVE

## 2024-08-10 ENCOUNTER — Other Ambulatory Visit: Payer: Self-pay | Admitting: Internal Medicine

## 2024-08-11 ENCOUNTER — Encounter: Payer: Self-pay | Admitting: Internal Medicine

## 2024-08-11 DIAGNOSIS — I130 Hypertensive heart and chronic kidney disease with heart failure and stage 1 through stage 4 chronic kidney disease, or unspecified chronic kidney disease: Secondary | ICD-10-CM

## 2024-08-11 DIAGNOSIS — I10 Essential (primary) hypertension: Secondary | ICD-10-CM

## 2024-08-11 DIAGNOSIS — K219 Gastro-esophageal reflux disease without esophagitis: Secondary | ICD-10-CM

## 2024-08-13 MED ORDER — CLOPIDOGREL BISULFATE 75 MG PO TABS: 75.0000 mg | ORAL_TABLET | Freq: Every day | ORAL | 1 refills | Status: DC

## 2024-08-13 MED ORDER — FUROSEMIDE 20 MG PO TABS: 20.0000 mg | ORAL_TABLET | Freq: Every day | ORAL | 1 refills | Status: AC

## 2024-08-13 NOTE — Telephone Encounter (Signed)
 Medication discontinued on 06/19/2024 due to pt completing course. Is it okay to refuse request?

## 2024-08-15 ENCOUNTER — Telehealth: Payer: Self-pay | Admitting: *Deleted

## 2024-08-15 NOTE — Telephone Encounter (Signed)
 Patient called to ask when she should stop her Plavix  before her scheduled procedure. I reminded her that she should stop it 7 days before the procedure.

## 2024-08-20 MED ORDER — CLOPIDOGREL BISULFATE 75 MG PO TABS: 75.0000 mg | ORAL_TABLET | Freq: Every day | ORAL | 1 refills | Status: AC

## 2024-08-22 ENCOUNTER — Ambulatory Visit: Admitting: Student in an Organized Health Care Education/Training Program

## 2024-08-22 ENCOUNTER — Encounter: Payer: Self-pay | Admitting: Student in an Organized Health Care Education/Training Program

## 2024-08-22 ENCOUNTER — Ambulatory Visit
Admission: RE | Admit: 2024-08-22 | Discharge: 2024-08-22 | Disposition: A | Discharge status: Home / Self Care | Source: Ambulatory Visit | Attending: Student in an Organized Health Care Education/Training Program | Admitting: Student in an Organized Health Care Education/Training Program

## 2024-08-22 VITALS — BP 134/70 | HR 64 | Temp 98.2°F | Resp 13 | Ht 60.0 in | Wt 162.0 lb

## 2024-08-22 DIAGNOSIS — G894 Chronic pain syndrome: Secondary | ICD-10-CM | POA: Insufficient documentation

## 2024-08-22 DIAGNOSIS — M47816 Spondylosis without myelopathy or radiculopathy, lumbar region: Secondary | ICD-10-CM

## 2024-08-22 MED ORDER — ROPIVACAINE HCL 2 MG/ML IJ SOLN
INTRAMUSCULAR | Status: AC
  Filled 2024-08-22: qty 20

## 2024-08-22 MED ORDER — LIDOCAINE HCL 2 % IJ SOLN
INTRAMUSCULAR | Status: AC
  Filled 2024-08-22: qty 20

## 2024-08-22 MED ORDER — LIDOCAINE HCL 2 % IJ SOLN
20.0000 mL | Freq: Once | INTRAMUSCULAR | Status: AC
  Administered 2024-08-22: 400 mg

## 2024-08-22 MED ORDER — FENTANYL CITRATE (PF) 100 MCG/2ML IJ SOLN
25.0000 ug | INTRAMUSCULAR | Status: DC | PRN
  Administered 2024-08-22: 100 ug via INTRAVENOUS

## 2024-08-22 MED ORDER — LACTATED RINGERS IV SOLN: Freq: Once | INTRAVENOUS | Status: AC

## 2024-08-22 MED ORDER — MIDAZOLAM HCL 5 MG/5ML IJ SOLN
INTRAMUSCULAR | Status: AC
Start: 2024-08-22 — End: 2024-08-22
  Filled 2024-08-22: qty 5

## 2024-08-22 MED ORDER — ROPIVACAINE HCL 2 MG/ML IJ SOLN
18.0000 mL | Freq: Once | INTRAMUSCULAR | Status: AC
  Administered 2024-08-22: 18 mL via PERINEURAL

## 2024-08-22 MED ORDER — FENTANYL CITRATE (PF) 100 MCG/2ML IJ SOLN
INTRAMUSCULAR | Status: AC
  Filled 2024-08-22: qty 2

## 2024-08-22 MED ORDER — DEXAMETHASONE SOD PHOSPHATE PF 10 MG/ML IJ SOLN
20.0000 mg | Freq: Once | INTRAMUSCULAR | Status: AC
  Administered 2024-08-22: 20 mg

## 2024-08-22 MED ORDER — MIDAZOLAM HCL 5 MG/5ML IJ SOLN
0.5000 mg | Freq: Once | INTRAMUSCULAR | Status: AC
  Administered 2024-08-22: 3 mg via INTRAVENOUS

## 2024-08-22 NOTE — Patient Instructions (Signed)
 ______________________________________________________________________    Post-Procedure Discharge Instructions  INSTRUCTIONS Apply ice:  Purpose: This will minimize any swelling and discomfort after procedure.  When: Day of procedure, as soon as you get home. How: Fill a plastic sandwich bag with crushed ice. Cover it with a small towel and apply to injection site. How long: (15 min on, 15 min off) Apply for 15 minutes then remove x 15 minutes.  Repeat sequence on day of procedure, until you go to bed. Apply heat:  Purpose: To treat any soreness and discomfort from the procedure. When: Starting the next day after the procedure. How: Apply heat to procedure site starting the day following the procedure. How long: May continue to repeat daily, until discomfort goes away. Food intake: Start with clear liquids (like water) and advance to regular food, as tolerated.  Physical activities: Keep activities to a minimum for the first 8 hours after the procedure. After that, then as tolerated. Driving: If you have received any sedation, be responsible and do not drive. You are not allowed to drive for 24 hours after having sedation. Blood thinner: (Applies only to those taking blood thinners) You may restart your blood thinner 6 hours after your procedure. Insulin : (Applies only to Diabetic patients taking insulin ) As soon as you can eat, you may resume your normal dosing schedule. Infection prevention: Keep procedure site clean and dry. Shower daily and clean area with soap and water.  PAIN DIARY Post-procedure Pain Diary: Extremely important that this be done correctly and accurately. Recorded information will be used to determine the next step in treatment. For the purpose of accuracy, follow these rules: Evaluate only the area treated. Do not report or include pain from an untreated area. For the purpose of this evaluation, ignore all other areas of pain, except for the treated area. After your  procedure, avoid taking a long nap and attempting to complete the pain diary after you wake up. Instead, set your alarm clock to go off every hour, on the hour, for the initial 8 hours after the procedure. Document the duration of the numbing medicine, and the relief you are getting from it. Do not go to sleep and attempt to complete it later. It will not be accurate. If you received sedation, it is likely that you were given a medication that may cause amnesia. Because of this, completing the diary at a later time may cause the information to be inaccurate. This information is needed to plan your care. Follow-up appointment: Keep your post-procedure follow-up evaluation appointment after the procedure (usually 2 weeks for most procedures, 6 weeks for radiofrequencies). DO NOT FORGET to bring you pain diary with you.   EXPECT... (What should I expect to see with my procedure?) From numbing medicine (AKA: Local Anesthetics): Numbness or decrease in pain. You may also experience some weakness, which if present, could last for the duration of the local anesthetic. Onset: Full effect within 15 minutes of injected. Duration: It will depend on the type of local anesthetic used. On the average, 1 to 8 hours.  From steroids (Applies only if steroids were used): Decrease in swelling or inflammation. Once inflammation is improved, relief of the pain will follow. Onset of benefits: Depends on the amount of swelling present. The more swelling, the longer it will take for the benefits to be seen. In some cases, up to 10 days. Duration: Steroids will stay in the system x 2 weeks. Duration of benefits will depend on multiple posibilities including persistent irritating  factors. Side-effects: If present, they may typically last 2 weeks (the duration of the steroids). Frequent: Cramps (if they occur, drink Gatorade and take over-the-counter Magnesium 450-500 mg once to twice a day); water retention with temporary weight  gain; increases in blood sugar; decreased immune system response; increased appetite. Occasional: Facial flushing (red, warm cheeks); mood swings; menstrual changes. Uncommon: Long-term decrease or suppression of natural hormones; bone thinning. (These are more common with higher doses or more frequent use. This is why we prefer that our patients avoid having any injection therapies in other practices.)  Very Rare: Severe mood changes; psychosis; aseptic necrosis. From procedure: Some discomfort is to be expected once the numbing medicine wears off. This should be minimal if ice and heat are applied as instructed.  CALL IF... (When should I call?) You experience numbness and weakness that gets worse with time, as opposed to wearing off. New onset bowel or bladder incontinence. (Applies only to procedures done in the spine)  Emergency Numbers: Durning business hours (Monday - Thursday, 8:00 AM - 4:00 PM) (Friday, 9:00 AM - 12:00 Noon): [phone] After hours: [phone] NOTE: If you are having a problem and are unable connect with, or to talk to a provider, then go to your nearest urgent care or emergency department. If the problem is serious and urgent, please call 911. ______________________________________________________________________    Moderate Conscious Sedation, Adult, Care After After the procedure, it is common to have: Sleepiness for a few hours. Impaired judgment for a few hours. Trouble with balance. Nausea or vomiting if you eat too soon. Follow these instructions at home: For the time period you were told by your health care provider:  Rest. Do not participate in activities where you could fall or become injured. Do not drive or use machinery. Do not drink alcohol. Do not take sleeping pills or medicines that cause drowsiness. Do not make important decisions or sign legal documents. Do not take care of children on your own. Eating and drinking Follow  instructions from your health care provider about what you may eat and drink. Drink enough fluid to keep your urine pale yellow. If you vomit: Drink clear fluids slowly and in small amounts as you are able. Clear fluids include water, ice chips, low-calorie sports drinks, and fruit juice that has water added to it (diluted fruit juice). Eat light and bland foods in small amounts as you are able. These foods include bananas, applesauce, rice, lean meats, toast, and crackers. General instructions Take over-the-counter and prescription medicines only as told by your health care provider. Have a responsible adult stay with you for the time you are told. Do not use any products that contain nicotine  or tobacco. These products include cigarettes, chewing tobacco, and vaping devices, such as e-cigarettes. If you need help quitting, ask your health care provider. Return to your normal activities as told by your health care provider. Ask your health care provider what activities are safe for you. Your health care provider may give you more instructions. Make sure you know what you can and cannot do. Contact a health care provider if: You are still sleepy or having trouble with balance after 24 hours. You feel light-headed. You vomit every time you eat or drink. You get a rash. You have a fever. You have redness or swelling around the IV site. Get help right away if: You have trouble breathing. You start to feel confused at home. These symptoms may be an emergency. Get help right  away. Call 911. Do not wait to see if the symptoms will go away. Do not drive yourself to the hospital. This information is not intended to replace advice given to you by your health care provider. Make sure you discuss any questions you have with your health care provider. Document Revised: 04/26/2022 Document Reviewed: 04/26/2022 [employer] Patient Education  2024 [employer].

## 2024-08-22 NOTE — Progress Notes (Signed)
 PROVIDER NOTE: Interpretation of information contained herein should be left to medically-trained personnel. Specific patient instructions are provided elsewhere under Patient Instructions section of medical record. This document was created in part using STT-dictation technology, any transcriptional errors that may result from this process are unintentional.  Patient: Sabrina [name] Type: Established DOB: [birth year] MRN: [id number] PCP: [name], MD  Service: Procedure DOS: 08/22/2024 Setting: Ambulatory Location: Ambulatory outpatient facility Delivery: Face-to-face Provider: Wallie Sherry, MD Specialty: Interventional Pain Management Specialty designation: 09 Location: Outpatient facility Ref. Prov.: [name], MD       Interventional Therapy   Procedure: Lumbar Facet, Medial Branch Radiofrequency Ablation (RFA) #1  Laterality: Bilateral (-50)  Level: L3, L4, and L5 Medial Branch Level(s). These levels will denervate the L3-4 and L4-5 lumbar facet joints.  Imaging: Fluoroscopy-guided         Anesthesia: Local anesthesia (1-2% Lidocaine ) Sedation: Moderate Sedation                       DOS: 08/22/2024  Performed by: Wallie Sherry, MD  Purpose: Therapeutic/Palliative Indications: Low back pain severe enough to impact quality of life or function. Indications: 1. Lumbar facet arthropathy   2. Lumbar spondylosis   3. Chronic pain syndrome    Sabrina [name] has been dealing with the above chronic pain for longer than three months and has either failed to respond, was unable to tolerate, or simply did not get enough benefit from other more conservative therapies including, but not limited to: 1. Over-the-counter medications 2. Anti-inflammatory medications 3. Muscle relaxants 4. Membrane stabilizers 5. Opioids 6. Physical therapy and/or chiropractic manipulation 7. Modalities (Heat, ice, etc.) 8. Invasive techniques such as nerve blocks. Sabrina [name] has attained  more than 50% relief of the pain from a series of diagnostic injections conducted in separate occasions.  Pain Score: Pre-procedure: 10-Worst pain ever/10 Post-procedure: 0-No pain/10     Position / Prep / Materials:  Position: Prone  Prep solution: ChloraPrep (2% chlorhexidine  gluconate and 70% isopropyl alcohol) Prep Area: Entire Lumbosacral Region (Lower back from mid-thoracic region to end of tailbone and from flank to flank.) Materials:  Tray: RFA (Radiofrequency) tray Needle(s):  Type: RFA (Teflon-coated radiofrequency ablation needles) Gauge (G): 22  Length: Regular (10cm) Qty: 6     H&P (Pre-op Assessment):  Sabrina [name] is a 83 y.o. (year old), female patient, seen today for interventional treatment. 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); Carotid endarterectomy (Left, 09/09/2003); Renal artery angioplasty (Bilateral, 12/2013); Colonoscopy with propofol  (N/A, 08/16/2017); Esophagogastroduodenoscopy (egd) with propofol  (N/A, 08/16/2017); Esophagogastroduodenoscopy (egd) with propofol  (N/A, 06/29/2018); LEFT HEART CATH AND CORONARY ANGIOGRAPHY (N/A, 11/18/2021); Upper Extremity Angiography (Left, 11/23/2021); Lower Extremity Angiography (Right, 01/06/2022); LEFT HEART CATH AND CORS/GRAFTS ANGIOGRAPHY (Left, 11/19/2002); LEFT HEART CATH AND CORS/GRAFTS ANGIOGRAPHY (Left, 09/17/2003); Endarterectomy femoral (Bilateral, 03/17/2022); Insertion of iliac stent (Left, 03/17/2022); Groin debridement (Right, 03/30/2022); Endarterectomy femoral (Right, 03/30/2022); ABDOMINAL AORTOGRAM W/LOWER EXTREMITY (N/A, 10/06/2022); PERIPHERAL  VASCULAR BALLOON ANGIOPLASTY (10/06/2022); ABDOMINAL AORTOGRAM W/LOWER EXTREMITY (N/A, 05/04/2023); ABDOMINAL AORTOGRAM W/LOWER EXTREMITY (N/A, 06/20/2024); and RENAL INTERVENTION (Bilateral, 06/20/2024). Sabrina [name] has a current medication list which includes the following prescription(s): amlodipine , aspirin , clopidogrel , ezetimibe , ferrous sulfate  er, furosemide , hydralazine , labetalol , losartan , methocarbamol , mirabegron  er,  oxybutynin, oxycodone -acetaminophen , pantoprazole , and ranolazine , and the following Facility-Administered Medications: fentanyl . Her primarily concern today is the Back Pain (lower)  Initial Vital Signs:  Pulse/HCG Rate: 64ECG Heart Rate: 65 (NSR) Temp: (!) 97.5 F (36.4 C) Resp: 16 BP: (!) 151/71 SpO2: 100 %  BMI: Estimated body mass index is 31.64 kg/m as calculated from the following:   Height as of this encounter: 5' (1.524 m).   Weight as of this encounter: 162 lb (73.5 kg).  Risk Assessment: Allergies: Reviewed. She is allergic to celexa  [citalopram ], dilaudid  [hydromorphone  hcl], effient [prasugrel], hydrochlorothiazide , liothyronine , nsaids, nubain [nalbuphine hcl], penicillins, and statins.  Allergy Precautions: None required Coagulopathies: Reviewed. None identified.  Blood-thinner therapy: None at this time Active Infection(s): Reviewed. None identified. Sabrina [name] is afebrile  Site Confirmation: Sabrina [name] was asked to confirm the procedure and laterality before marking the site Procedure checklist: Completed Consent: Before the procedure and under the influence of no sedative(s), amnesic(s), or anxiolytics, the patient was informed of the treatment options, risks and possible complications. To fulfill our ethical and legal obligations, as recommended by the American Medical Association's Code of Ethics, I have informed the patient of my clinical impression; the nature and purpose of the treatment or procedure; the risks, benefits, and possible  complications of the intervention; the alternatives, including doing nothing; the risk(s) and benefit(s) of the alternative treatment(s) or procedure(s); and the risk(s) and benefit(s) of doing nothing. The patient was provided information about the general risks and possible complications associated with the procedure. These may include, but are not limited to: failure to achieve desired goals, infection, bleeding, organ or nerve damage, allergic reactions, paralysis, and death. In addition, the patient was informed of those risks and complications associated to Spine-related procedures, such as failure to decrease pain; infection (i.e.: Meningitis, epidural or intraspinal abscess); bleeding (i.e.: epidural hematoma, subarachnoid hemorrhage, or any other type of intraspinal or peri-dural bleeding); organ or nerve damage (i.e.: Any type of peripheral nerve, nerve root, or spinal cord injury) with subsequent damage to sensory, motor, and/or autonomic systems, resulting in permanent pain, numbness, and/or weakness of one or several areas of the body; allergic reactions; (i.e.: anaphylactic reaction); and/or death. Furthermore, the patient was informed of those risks and complications associated with the medications. These include, but are not limited to: allergic reactions (i.e.: anaphylactic or anaphylactoid reaction(s)); adrenal axis suppression; blood sugar elevation that in diabetics may result in ketoacidosis or comma; water retention that in patients with history of congestive heart failure may result in shortness of breath, pulmonary edema, and decompensation with resultant heart failure; weight gain; swelling or edema; medication-induced neural toxicity; particulate matter embolism and blood vessel occlusion with resultant organ, and/or nervous system infarction; and/or aseptic necrosis of one or more joints. Finally, the patient was informed that Medicine is not an exact science; therefore, there is also  the possibility of unforeseen or unpredictable risks and/or possible complications that may result in a catastrophic outcome. The patient indicated having understood very clearly. We have given the patient no guarantees and we have made no promises. Enough time was given to the patient to ask questions, all of which were answered to the patient's satisfaction. Sabrina [name] has indicated that she wanted to continue with the procedure. Attestation: I, the ordering provider, attest that I have discussed with the patient the benefits, risks, side-effects, alternatives, likelihood of achieving goals, and potential problems during recovery for the procedure that I have provided informed consent. Date  Time: 08/22/2024  9:52  AM  Pre-Procedure Preparation:  Monitoring: As per clinic protocol. Respiration, ETCO2, SpO2, BP, heart rate and rhythm monitor placed and checked for adequate function Safety Precautions: Patient was assessed for positional comfort and pressure points before starting the procedure. Time-out: I initiated and conducted the Time-out before starting the procedure, as per protocol. The patient was asked to participate by confirming the accuracy of the Time Out information. Verification of the correct person, site, and procedure were performed and confirmed by me, the nursing staff, and the patient. Time-out conducted as per Joint Commission's Universal Protocol (UP.01.01.01). Time: 1147 Start Time: 1147 hrs.  Description of Procedure:          Laterality: See above. Levels:  See above. Safety Precautions: Aspiration looking for blood return was conducted prior to all injections. At no point did we inject any substances, as a needle was being advanced. Before injecting, the patient was told to immediately notify me if she was experiencing any new onset of ringing in the ears, or metallic taste in the mouth. No attempts were made at seeking any paresthesias. Safe injection practices  and needle disposal techniques used. Medications properly checked for expiration dates. SDV (single dose vial) medications used. After the completion of the procedure, all disposable equipment used was discarded in the proper designated medical waste containers. Local Anesthesia: Protocol guidelines were followed. The patient was positioned over the fluoroscopy table. The area was prepped in the usual manner. The time-out was completed. The target area was identified using fluoroscopy. A 12-in long, straight, sterile hemostat was used with fluoroscopic guidance to locate the targets for each level blocked. Once located, the skin was marked with an approved surgical skin marker. Once all sites were marked, the skin (epidermis, dermis, and hypodermis), as well as deeper tissues (fat, connective tissue and muscle) were infiltrated with a small amount of a short-acting local anesthetic, loaded on a 10cc syringe with a 25G, 1.5-in  Needle. An appropriate amount of time was allowed for local anesthetics to take effect before proceeding to the next step. Technical description of process:  Radiofrequency Ablation (RFA) L3 Medial Branch Nerve RFA: The target area for the L3 medial branch is at the junction of the postero-lateral aspect of the superior articular process and the superior, posterior, and medial edge of the transverse process of L4. Under fluoroscopic guidance, a Radiofrequency needle was inserted until contact was made with os over the superior postero-lateral aspect of the pedicular shadow (target area). Sensory and motor testing was conducted to properly adjust the position of the needle. Once satisfactory placement of the needle was achieved, the numbing solution was slowly injected after negative aspiration for blood. 2.0 mL of the nerve block solution was injected without difficulty or complication. After waiting for at least 3 minutes, the ablation was performed. Once completed, the needle was removed  intact. L4 Medial Branch Nerve RFA: The target area for the L4 medial branch is at the junction of the postero-lateral aspect of the superior articular process and the superior, posterior, and medial edge of the transverse process of L5. Under fluoroscopic guidance, a Radiofrequency needle was inserted until contact was made with os over the superior postero-lateral aspect of the pedicular shadow (target area). Sensory and motor testing was conducted to properly adjust the position of the needle. Once satisfactory placement of the needle was achieved, the numbing solution was slowly injected after negative aspiration for blood. 2.0 mL of the nerve block solution was injected without difficulty or complication.  After waiting for at least 3 minutes, the ablation was performed. Once completed, the needle was removed intact. L5 Medial Branch Nerve RFA: The target area for the L5 medial branch is at the junction of the postero-lateral aspect of the superior articular process of S1 and the superior, posterior, and medial edge of the sacral ala. Under fluoroscopic guidance, a Radiofrequency needle was inserted until contact was made with os over the superior postero-lateral aspect of the pedicular shadow (target area). Sensory and motor testing was conducted to properly adjust the position of the needle. Once satisfactory placement of the needle was achieved, the numbing solution was slowly injected after negative aspiration for blood. 2.0 mL of the nerve block solution was injected without difficulty or complication. After waiting for at least 3 minutes, the ablation was performed. Once completed, the needle was removed intact. Radiofrequency lesioning (ablation):  Radiofrequency Generator: Medtronic AccurianTM AG 1000 RF Generator Sensory Stimulation Parameters: 50 Hz was used to locate & identify the nerve, making sure that the needle was positioned such that there was no sensory stimulation below 0.3 V or above  0.7 V. Motor Stimulation Parameters: 2 Hz was used to evaluate the motor component. Care was taken not to lesion any nerves that demonstrated motor stimulation of the lower extremities at an output of less than 2.5 times that of the sensory threshold, or a maximum of 2.0 V. Lesioning Technique Parameters: Standard Radiofrequency settings. (Not bipolar or pulsed.) Temperature Settings: 80 degrees C Lesioning time: 60 seconds Stationary intra-operative compliance: Compliant  Once the entire procedure was completed, the treated area was cleaned, making sure to leave some of the prepping solution back to take advantage of its long term bactericidal properties.    Illustration of the posterior view of the lumbar spine and the posterior neural structures. Laminae of L2 through S1 are labeled. DPRL5, dorsal primary ramus of L5; DPRS1, dorsal primary ramus of S1; DPR3, dorsal primary ramus of L3; FJ, facet (zygapophyseal) joint L3-L4; I, inferior articular process of L4; LB1, lateral branch of dorsal primary ramus of L1; IAB, inferior articular branches from L3 medial branch (supplies L4-L5 facet joint); IBP, intermediate branch plexus; MB3, medial branch of dorsal primary ramus of L3; NR3, third lumbar nerve root; S, superior articular process of L5; SAB, superior articular branches from L4 (supplies L4-5 facet joint also); TP3, transverse process of L3.  Facet Joint Innervation (* possible contribution)  L1-2 T12, L1 (L2*)  Medial Branch  L2-3 L1, L2 (L3*)                     L3-4 L2, L3 (L4*)                     L4-5 L3, L4 (L5*)                     L5-S1 L4, L5, S1                        Vitals:   08/22/24 1210 08/22/24 1220 08/22/24 1230 08/22/24 1240  BP: (!) 152/89 (!) 162/66 (!) 160/66 134/70  Pulse:      Resp: 18 16 13    Temp:    98.2 F (36.8 C)  TempSrc:    Temporal  SpO2: 100% 99% 98% 97%  Weight:      Height:        Start Time: 1147 hrs. End Time: 1210 hrs.  Imaging  Guidance (Spinal):         Type of Imaging Technique: Fluoroscopy Guidance (Spinal) Indication(s): Fluoroscopy guidance for needle placement to enhance accuracy in procedures requiring precise needle localization for targeted delivery of medication in or near specific anatomical locations not easily accessible without such real-time imaging assistance. Exposure Time: Please see nurses notes. Contrast: None used. Fluoroscopic Guidance: I was personally present during the use of fluoroscopy. Tunnel Vision Technique used to obtain the best possible view of the target area. Parallax error corrected before commencing the procedure. Direction-depth-direction technique used to introduce the needle under continuous pulsed fluoroscopy. Once target was reached, antero-posterior, oblique, and lateral fluoroscopic projection used confirm needle placement in all planes. Images permanently stored in EMR. Interpretation: No contrast injected. I personally interpreted the imaging intraoperatively. Adequate needle placement confirmed in multiple planes. Permanent images saved into the patient's record.  Antibiotic Prophylaxis:   Anti-infectives (From admission, onward)    None      Indication(s): None identified  Post-operative Assessment:  Post-procedure Vital Signs:  Pulse/HCG Rate: 6462 Temp: 98.2 F (36.8 C) Resp: 13 BP: 134/70 SpO2: 97 %  EBL: None  Complications: No immediate post-treatment complications observed by team, or reported by patient.  Note: The patient tolerated the entire procedure well. A repeat set of vitals were taken after the procedure and the patient was kept under observation following institutional policy, for this type of procedure. Post-procedural neurological assessment was performed, showing return to baseline, prior to discharge. The patient was provided with post-procedure discharge instructions, including a section on how to identify potential problems. Should any  problems arise concerning this procedure, the patient was given instructions to immediately contact us , at any time, without hesitation. In any case, we plan to contact the patient by telephone for a follow-up status report regarding this interventional procedure.  Comments:  No additional relevant information.  Plan of Care (POC)  Orders:  Orders Placed This Encounter  Procedures   DG PAIN CLINIC C-ARM 1-60 MIN NO REPORT    Intraoperative interpretation by procedural physician at [facility].    Standing Status:   Standing    Number of Occurrences:   1    Reason for exam::   Assistance in needle guidance and placement for procedures requiring needle placement in or near specific anatomical locations not easily accessible without such assistance.     Medications ordered for procedure: Meds ordered this encounter  Medications   lidocaine  (XYLOCAINE ) 2 % (with pres) injection 400 mg   lactated ringers  infusion   midazolam  (VERSED ) 5 MG/5ML injection 0.5-2 mg    Make sure Flumazenil is available in the pyxis when using this medication. If oversedation occurs, administer 0.2 mg IV over 15 sec. If after 45 sec no response, administer 0.2 mg again over 1 min; may repeat at 1 min intervals; not to exceed 4 doses (1 mg)   fentaNYL  (SUBLIMAZE ) injection 25-50 mcg    Make sure Narcan is available in the pyxis when using this medication. In the event of respiratory depression (RR< 8/min): Titrate NARCAN (naloxone) in increments of 0.1 to 0.2 mg IV at 2-3 minute intervals, until desired degree of reversal.   ropivacaine  (PF) 2 mg/mL (0.2%) (NAROPIN ) injection 18 mL   dexamethasone  (DECADRON ) injection 20 mg   Medications administered: We administered lidocaine , lactated ringers , midazolam , fentaNYL , ropivacaine  (PF) 2 mg/mL (0.2%), and dexamethasone .  See the medical record for exact dosing, route, and time of administration.    Bilateral  L3, L4, L5 RFA 08/22/2024    Follow-up  plan:   Return in about 8 weeks (around 10/17/2024) for PPE, F2F, Seema.     Recent Visits Date Type Provider Dept  08/08/24 Procedure visit Marcelino Nurse, MD [facility]  07/26/24 Office Visit Tobie Emmy POUR, NP [facility]  07/10/24 Office Visit Marcelino Nurse, MD [facility]  06/26/24 Office Visit Patel, Seema K, NP [facility]  05/29/24 Office Visit Patel, Seema K, NP [facility]  Showing recent visits within past 90 days and meeting all other requirements Today's Visits Date Type Provider Dept  08/22/24 Procedure visit Marcelino Nurse, MD [facility]  Showing today's visits and meeting all other requirements Future Appointments Date Type Provider Dept  08/23/24 Appointment Patel, Seema K, NP [facility]  10/09/24 Appointment Patel, Seema K, NP [facility]  Showing future appointments within next 90 days and meeting all other requirements   Disposition: Discharge home  Discharge (Date  Time): 08/22/2024; 1242 hrs.   Primary Care Physician: [name], MD Location: [facility] Note by: Nurse Marcelino, MD (TTS technology used. I apologize for any typographical errors that were not detected and corrected.) Date: 08/22/2024; Time: 1:15 PM  Disclaimer:  Medicine is not an visual merchandiser. The only guarantee in medicine is that nothing is guaranteed. It is important to note that the decision to proceed with this intervention was based on the information collected from the patient. The Data and conclusions were drawn from the patient's questionnaire, the interview, and the physical examination. Because the information was provided in large part by the patient, it cannot be guaranteed that it has not been purposely or unconsciously manipulated. Every effort has been made to obtain as much relevant data as possible for this evaluation. It is important to note that the conclusions that  lead to this procedure are derived in large part from the available data. Always take into account that the treatment will also be dependent on availability of resources and existing treatment guidelines, considered by other Pain Management Practitioners as being common knowledge and practice, at the time of the intervention. For Medico-Legal purposes, it is also important to point out that variation in procedural techniques and pharmacological choices are the acceptable norm. The indications, contraindications, technique, and results of the above procedure should only be interpreted and judged by a Board-Certified Interventional Pain Specialist with extensive familiarity and expertise in the same exact procedure and technique.

## 2024-08-23 ENCOUNTER — Encounter: Admitting: Nurse Practitioner

## 2024-08-23 ENCOUNTER — Telehealth: Payer: Self-pay

## 2024-08-23 NOTE — Telephone Encounter (Signed)
 Post procedure follow up.  Patient states she is doing good.

## 2024-08-27 ENCOUNTER — Ambulatory Visit: Admitting: Student in an Organized Health Care Education/Training Program

## 2024-09-14 ENCOUNTER — Other Ambulatory Visit: Payer: Self-pay | Admitting: Internal Medicine

## 2024-09-14 MED ORDER — RANOLAZINE ER 500 MG PO TB12: 500.0000 mg | ORAL_TABLET | Freq: Two times a day (BID) | ORAL | 1 refills | Status: AC

## 2024-09-14 NOTE — Telephone Encounter (Signed)
 Copied from CRM [id number]. Topic: Clinical - Medication Refill >> Sep 14, 2024  1:11 PM Nessti S wrote: Medication: ranolazine  (RANEXA ) 500 MG 12 hr tablet; Nortriptyline  10 mg   Has the patient contacted their pharmacy? No (Agent: If no, request that the patient contact the pharmacy for the refill. If patient does not wish to contact the pharmacy document the reason why and proceed with request.) (Agent: If yes, when and what did the pharmacy advise?)  This is the patient's preferred pharmacy:  [facility] [address]), [place] - [address] [place]) [place] [address] Phone: [phone] Fax: [phone]  Is this the correct pharmacy for this prescription? Yes If no, delete pharmacy and type the correct one.   Has the prescription been filled recently? No  Is the patient out of the medication? No. Have 4 left  Has the patient been seen for an appointment in the last year OR does the patient have an upcoming appointment? Yes  Can we respond through MyChart? Yes  Agent: Please be advised that Rx refills may take up to 3 business days. We ask that you follow-up with your pharmacy.

## 2024-09-26 ENCOUNTER — Encounter: Payer: Self-pay | Admitting: Internal Medicine

## 2024-09-27 ENCOUNTER — Other Ambulatory Visit: Payer: Self-pay | Admitting: Internal Medicine

## 2024-09-27 DIAGNOSIS — R051 Acute cough: Secondary | ICD-10-CM

## 2024-09-27 NOTE — Telephone Encounter (Signed)
 Not in current medication list.   I have pended medication for your approval.

## 2024-09-27 NOTE — Telephone Encounter (Signed)
 Copied from CRM #[id number]. Topic: Clinical - Medication Refill >> Sep 27, 2024 11:44 AM Zebedee SAUNDERS wrote: Medication: Nortriptyline  10mg  cap  Has the patient contacted their pharmacy? Yes (Agent: If no, request that the patient contact the pharmacy for the refill. If patient does not wish to contact the pharmacy document the reason why and proceed with request.) (Agent: If yes, when and what did the pharmacy advise?)Pharmacy need script  This is the patient's preferred pharmacy:  [facility] [address] ([address]), [place] - [address] [place]) [place] [address] Phone: [phone] Fax: [phone]  Is this the correct pharmacy for this prescription? Yes If no, delete pharmacy and type the correct one.   Has the prescription been filled recently? Yes  Is the patient out of the medication? Yes  Has the patient been seen for an appointment in the last year OR does the patient have an upcoming appointment? Yes  Can we respond through MyChart? Yes  Agent: Please be advised that Rx refills may take up to 3 business days. We ask that you follow-up with your pharmacy.

## 2024-09-28 MED ORDER — NORTRIPTYLINE HCL 10 MG PO CAPS: 10.0000 mg | ORAL_CAPSULE | Freq: Two times a day (BID) | ORAL | 3 refills | Status: AC

## 2024-09-28 NOTE — Telephone Encounter (Signed)
 LMTCB. Please let pt know that Nortriptyline  has been refilled.

## 2024-09-28 NOTE — Telephone Encounter (Signed)
 Copied from CRM #[id number]. Topic: Clinical - Medication Refill >> Sep 27, 2024 11:44 AM Zebedee SAUNDERS wrote: Medication: Nortriptyline  10mg  cap  Has the patient contacted their pharmacy? Yes (Agent: If no, request that the patient contact the pharmacy for the refill. If patient does not wish to contact the pharmacy document the reason why and proceed with request.) (Agent: If yes, when and what did the pharmacy advise?)Pharmacy need script  This is the patient's preferred pharmacy:  [facility] [address]), [place] - [address] [place]) [place] [address] Phone: [phone] Fax: [phone]  Is this the correct pharmacy for this prescription? Yes If no, delete pharmacy and type the correct one.   Has the prescription been filled recently? Yes  Is the patient out of the medication? Yes  Has the patient been seen for an appointment in the last year OR does the patient have an upcoming appointment? Yes  Can we respond through MyChart? Yes  Agent: Please be advised that Rx refills may take up to 3 business days. We ask that you follow-up with your pharmacy. >> Sep 28, 2024  1:42 PM Alexandria E wrote: Patient called back regarding the refill status of this medication. Relayed note that PCP attached under the refill encounter, and patient is still wanting this medication called in before the weekend. Patient requesting call back before the end of the day.

## 2024-09-28 NOTE — Telephone Encounter (Signed)
 Sabrina [name], CMA to [name], MD  Harriette Raisin, CMA (Selected Message)     09/28/24  2:29 PM Spoke with patient to follow up with prior MyChart in regards to patient refill request on Nortriptyline . Patient states she has been less sleepy, but she has restarted the medication and is down to two tablets because she has been having headaches every day all day. Patient states she is upset because she has been requesting and followed up with Dr Marylynn in November about this issue. I apologized to patient and informed her that the last in office appointment she had with the provider was back at the beginning of October. Please advise. Patient states she would like to have a call back from Dr Lula CMA Raisin in regards to this request.

## 2024-10-03 ENCOUNTER — Other Ambulatory Visit: Payer: Self-pay | Admitting: Internal Medicine

## 2024-10-03 DIAGNOSIS — K219 Gastro-esophageal reflux disease without esophagitis: Secondary | ICD-10-CM

## 2024-10-03 DIAGNOSIS — I10 Essential (primary) hypertension: Secondary | ICD-10-CM

## 2024-10-03 NOTE — Telephone Encounter (Signed)
 Copied from CRM [id number]. Topic: Clinical - Medication Refill >> Oct 03, 2024  2:45 PM Alfonso ORN wrote: Medication:  labetalol  (NORMODYNE ) 300 MG tablet pantoprazole  (PROTONIX ) 40 MG tablet   Has the patient contacted their pharmacy? No  This is the patient's preferred pharmacy:  [facility] [address]), [place] - [address] [place]) [place] [address] Phone: [phone] Fax: [phone]  Is this the correct pharmacy for this prescription? Yes If no, delete pharmacy and type the correct one.   Has the prescription been filled recently? No  Is the patient out of the medication? No  Has the patient been seen for an appointment in the last year OR does the patient have an upcoming appointment? Yes  Can we respond through MyChart? No

## 2024-10-09 ENCOUNTER — Encounter: Admitting: Nurse Practitioner

## 2024-10-15 ENCOUNTER — Ambulatory Visit: Admitting: Urology

## 2024-10-29 ENCOUNTER — Telehealth: Payer: Self-pay

## 2024-10-29 ENCOUNTER — Ambulatory Visit (INDEPENDENT_AMBULATORY_CARE_PROVIDER_SITE_OTHER): Admitting: Urology

## 2024-10-29 VITALS — BP 117/67 | HR 76 | Ht 60.0 in | Wt 162.0 lb

## 2024-10-29 DIAGNOSIS — R32 Unspecified urinary incontinence: Secondary | ICD-10-CM

## 2024-10-29 DIAGNOSIS — N3941 Urge incontinence: Secondary | ICD-10-CM

## 2024-10-29 MED ORDER — MIRABEGRON ER 50 MG PO TB24: 50.0000 mg | ORAL_TABLET | Freq: Every day | ORAL | 11 refills | Status: AC

## 2024-10-29 NOTE — Patient Instructions (Signed)

## 2024-10-29 NOTE — Progress Notes (Signed)
 "  10/29/2024 1:40 PM   Sabrina [name] 1941/03/30 [id number]  Referring provider: [name], MD [address] [place],  [place] [address]  No chief complaint on file.   HPI: I was consulted to assist the patient is urinary incontinence.  She has urge incontinence.  Sometimes she might leak a small amount with coughing sneezing.  She wears 2 pads a day moderately wet   She voids every 3 hours and every 2 hours at night.  Her flow was good especially when she drinks a lot of fluids.   She thinks she may have had some sort of surgery or stent in her bladder years ago.  She has had a hysterectomy.  On further review it looks like she had a cystoscopy and bilateral stents in 2023 but I did not see it in the local medical record.  Since that time she had a normal renal ultrasound with no stents or hydronephrosis.  She has had a lot of vascular issues.     I would like to see the patient back on Gemtesa  samples and prescription for pelvic exam and cystoscopy in about 6 weeks. She has milder frequency and nocturia. She primarily has urge incontinence. I do not think she needs urodynamics currently    Patient no longer leaking is very pleased.  Still has some urgency.  She did not want cystoscopy.Reassess 4 months durability on Gemtesa  30 x 11 sent to pharmacy    Patient started have incontinence again.  Clinically not infected.  Frequency stable.  Stop Gemtesa . Reassess in 6 weeks on Myrbetriq  50 mg 30 x 11. Call if urine culture is positive     Last urine culture was positive Clinically not infected today but urine looked positive.  No response to Myrbetriq .  She asked me about another physician and Meban and wondered if that doctor was a urologist   Reassess in 6 weeks on oxybutynin .  Discussed third line therapies at that time.  I think is reasonable not to order urodynamics but this is an option to consider.  Dr. Lazarus is not a urologist We will be interesting to see if  her next urine culture is positive   October 29 2014 Frequency stable.  Last culture negative Still has urge incontinence wearing 1 pad a day moderately wet.  No stress incontinence.  No Myrbetriq .  Failed oxybutynin .  Currently on Myrbetriq  with modest results.  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 b.) TTE 11/16/2021: EF >55%; mild AS (MPG 9 mmHg)   CAD (coronary artery disease)    a.) s/p 3v CABG 03/29/2000   Cardiac arrest [facility])    Carotid artery stenosis    a.) s/p LEFT CEA 09/09/2003. b.) Carotid doppler 88/78/7980: 1-39% LICA, CTO RICA; subclavian stenosis   Cataracts, bilateral    Cervical spondylosis without myelopathy    Chronic diastolic CHF (congestive heart failure), NYHA class 3 (HCC)    a.) TTE 05/27/2016: EF >55%, mild LA enlargement, triv PR, mild MR, mod TR; G3DD. b.)  TTE 12/12/2017: EF >55%, mild LVH, BAE, mild MR/PR, mod TR; RVSP 52.8 mmHg. c.) TTE 03/18/2020: EF >55%, BAD, AS (MPG 8.7 mmHg); triv MR, mild TR/PR. d.) TTE 11/16/2021: EF >55%, LVH, G1DD, triv MR, mild PR, mod TR; AS (MPG 9 mmHg); MS (MPG 5 mmHg)   Chronic kidney disease, stage III (moderate) (HCC)    Chronic narcotic use 06/24/2014   Chronic pain syndrome    GERD (gastroesophageal reflux disease)    History of 2019 novel coronavirus disease (COVID-19) 09/22/2021   Hyperlipidemia    Hypertension    Long term current use of antithrombotics/antiplatelets    a.) on daily DAPT therapy (ASA + clopidogrel )   Lumbar stenosis with neurogenic claudication    Mitral  stenosis 11/16/2021   a.) TTE 11/16/2021: EF >55%; mod MS (MPG 5 mmHg)   Osteoarthritis of hip    Postoperative wound infection 03/29/2022   Pulmonary hypertension (HCC) 12/12/2017   a.) TTE 12/12/2017: mild; RVSP 52.8 mmHg   PVD (peripheral vascular disease)    Renal artery stenosis    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    Surgical History: Past Surgical History:  Procedure Laterality Date   ABDOMINAL AORTOGRAM W/LOWER EXTREMITY N/A 10/06/2022   Procedure: ABDOMINAL AORTOGRAM W/LOWER EXTREMITY;  Surgeon: Lanis Fonda BRAVO, MD;  Location: [facility] INVASIVE CV LAB;  Service: Cardiovascular;  Laterality: N/A;   ABDOMINAL AORTOGRAM W/LOWER EXTREMITY N/A 05/04/2023   Procedure: ABDOMINAL AORTOGRAM W/LOWER EXTREMITY;  Surgeon: Lanis Fonda BRAVO, MD;  Location: [facility] INVASIVE CV LAB;  Service: Cardiovascular;  Laterality: N/A;   ABDOMINAL AORTOGRAM W/LOWER EXTREMITY N/A 06/20/2024   Procedure: ABDOMINAL AORTOGRAM W/LOWER EXTREMITY;  Surgeon: Lanis Fonda BRAVO, MD;  Location: [facility] INVASIVE CV LAB;  Service: Cardiovascular;  Laterality: N/A;   ABDOMINAL HYSTERECTOMY  1976   CAROTID ARTERY ANGIOPLASTY Left    CAROTID ENDARTERECTOMY Left 09/09/2003   Procedure: CAROTID ENDARTERECTOMY; Location: [facility]; Surgeon: Charlie Lango, MD   COLONOSCOPY WITH PROPOFOL  N/A 08/16/2017   Procedure: COLONOSCOPY WITH PROPOFOL ;  Surgeon: Jinny Carmine, MD;  Location: [facility] ENDOSCOPY;  Service: Endoscopy;  Laterality: N/A;   CORONARY ANGIOPLASTY WITH STENT PLACEMENT  2000   CORONARY ARTERY BYPASS GRAFT N/A 03/29/2000   Procedure: 3v CORONARY ARTERY BYPASS GRAFT; Location: [facility]   CYSTOSCOPY WITH STENT PLACEMENT Bilateral    ENDARTERECTOMY FEMORAL Bilateral 03/17/2022   Procedure: ENDARTERECTOMY FEMORAL ( BILATERAL SFA STENT);   Surgeon: Jama Cordella MATSU, MD;  Location: [facility] ORS;  Service: Vascular;  Laterality: Bilateral;   ENDARTERECTOMY FEMORAL Right 03/30/2022   Procedure: RE-EXPOSURE OF RIGHT COMMON FEMORAL ARTERY, RE-DO RIGHT FEMORAL ENDARTERECTOMY WITH VEIN PATCH;  Surgeon: Lanis Fonda BRAVO, MD;  Location: [facility] OR;  Service: Vascular;  Laterality: Right;  WOUND VAC   ESOPHAGOGASTRODUODENOSCOPY (EGD) WITH PROPOFOL  N/A 08/16/2017   Procedure: ESOPHAGOGASTRODUODENOSCOPY (EGD) WITH PROPOFOL ;  Surgeon: Jinny Carmine, MD;  Location: [facility] ENDOSCOPY;  Service: Endoscopy;  Laterality: N/A;   ESOPHAGOGASTRODUODENOSCOPY (EGD) WITH PROPOFOL  N/A 06/29/2018   Procedure: ESOPHAGOGASTRODUODENOSCOPY (EGD) WITH PROPOFOL ;  Surgeon: Janalyn Keene NOVAK, MD;  Location: [facility]OSCOPY;  Service: Endoscopy;  Laterality: N/A;   GROIN DEBRIDEMENT Right 03/30/2022   Procedure: RIGHT GROIN IRRIGATION & DEBRIDEMENT;  Surgeon: Lanis Fonda BRAVO, MD;  Location: [facility] OR;  Service: Vascular;  Laterality: Right;  INSERTION OF ILIAC STENT Left 03/17/2022   Procedure: INSERTION OF ILIAC STENT;  Surgeon: Jama Cordella MATSU, MD;  Location: [facility] ORS;  Service: Vascular;  Laterality: Left;   LAPAROSCOPIC CHOLECYSTECTOMY Left 10/26/1999   Procedure: LAPAROSCOPIC CHOLECYSTECTOMY; Location: [facility]; Surgeon: Unknown Sharps, MD   LEFT HEART CATH AND CORONARY ANGIOGRAPHY N/A 11/18/2021   Procedure: LEFT HEART CATH AND CORONARY ANGIOGRAPHY;  Surgeon: Florencio Cara BIRCH, MD;  Location: [facility] INVASIVE CV LAB;  Service: Cardiovascular;  Laterality: N/A;   LEFT HEART CATH AND CORS/GRAFTS ANGIOGRAPHY Left 11/19/2002   Procedure: LEFT HEART CATH AND CORS/GRAFTS ANGIOGRAPHY; Location: [facility]; Surgeon: Margie Florencio, MD   LEFT HEART CATH AND CORS/GRAFTS ANGIOGRAPHY Left 09/17/2003   Procedure: LEFT HEART CATH AND CORS/GRAFTS ANGIOGRAPHY; Location: [facility]; Surgeon: Margie Florencio, MD   LOWER EXTREMITY ANGIOGRAPHY Right 01/06/2022   Procedure: Lower Extremity Angiography;  Surgeon:  Jama Cordella MATSU, MD;  Location: [facility] INVASIVE CV LAB;  Service: Cardiovascular;  Laterality: Right;   PERIPHERAL VASCULAR BALLOON ANGIOPLASTY  10/06/2022   Procedure: PERIPHERAL VASCULAR BALLOON ANGIOPLASTY;  Surgeon: Lanis Fonda BRAVO, MD;  Location: [facility] INVASIVE CV LAB;  Service: Cardiovascular;;  left iliac and bilateral renal   RENAL ARTERY ANGIOPLASTY Bilateral 12/2013   RENAL INTERVENTION Bilateral 06/20/2024   Procedure: RENAL INTERVENTION;  Surgeon: Lanis Fonda BRAVO, MD;  Location: [facility] INVASIVE CV LAB;  Service: Cardiovascular;  Laterality: Bilateral;   TOE AMPUTATION Right    small toe   TONSILLECTOMY AND ADENOIDECTOMY     TOTAL HIP ARTHROPLASTY Left 2005   TOTAL HIP ARTHROPLASTY Right 2015   UPPER EXTREMITY ANGIOGRAPHY Left 11/23/2021   Procedure: UPPER EXTREMITY ANGIOGRAPHY;  Surgeon: Marea Selinda RAMAN, MD;  Location: [facility] INVASIVE CV LAB;  Service: Cardiovascular;  Laterality: Left;    Home Medications:  Allergies as of 10/29/2024       Reactions   Celexa  [citalopram ] Anaphylaxis   Throat closing    Dilaudid  [hydromorphone  Hcl] Nausea And Vomiting   Effient [prasugrel] Itching   Hydrochlorothiazide  Other (See Comments)   Decreased GFR (Nov 2015)   Liothyronine     Hair fell out, caused headaches    Nsaids    CKD stage III - avoid nephrotoxic drugs   Nubain [nalbuphine Hcl]    Burning sensation in back   Penicillins Itching   Statins Itching        Medication List        Accurate as of October 29, 2024  1:40 PM. If you have any questions, ask your nurse or doctor.          amLODipine  10 MG tablet Commonly known as: NORVASC  Take 1 tablet (10 mg total) by mouth at bedtime.   aspirin  81 MG tablet Take 81 mg by mouth in the morning.   clopidogrel  75 MG tablet Commonly known as: PLAVIX  Take 1 tablet (75 mg total) by mouth at bedtime. To prevent strokes   ezetimibe  10 MG tablet Commonly known as: ZETIA  Take 1 tablet (10 mg total) by mouth at bedtime. For  cholesterol   ferrous sulfate  ER 142 (45 Fe) MG Tbcr tablet Commonly known as: Slow Fe Take 1 tablet (45 mg of iron total) by mouth daily.   furosemide  20 MG tablet Commonly known as: LASIX  Take 1 tablet (20 mg total) by mouth daily. For fluid retention   hydrALAZINE  50 MG tablet Commonly known as: APRESOLINE  TAKE 1 TABLET BY MOUTH THREE TIMES DAILY AS NEEDED FOR BLOOD PRESSURE GREATER THAN 150   labetalol  300 MG tablet Commonly  known as: NORMODYNE  TAKE 1 TABLET BY MOUTH TWICE DAILY FOR HIGH BLOOD PRESSURE   losartan  50 MG tablet Commonly known as: COZAAR  Take 1 tablet (50 mg total) by mouth at bedtime. For blood pressure   methocarbamol  500 MG tablet Commonly known as: ROBAXIN  Take 1 tablet (500 mg total) by mouth every 8 (eight) hours as needed for muscle spasms.   mirabegron  ER 50 MG Tb24 tablet Commonly known as: MYRBETRIQ  Take 1 tablet (50 mg total) by mouth daily.   nortriptyline  10 MG capsule Commonly known as: PAMELOR  Take 1 capsule (10 mg total) by mouth 2 (two) times daily. For chronic headaches   oxybutynin  10 MG 24 hr tablet Commonly known as: DITROPAN -XL Take 1 tablet (10 mg total) by mouth daily.   pantoprazole  40 MG tablet Commonly known as: PROTONIX  One tablet upon waking,  take 30 minuts prior to eating   ranolazine  500 MG 12 hr tablet Commonly known as: RANEXA  Take 1 tablet (500 mg total) by mouth 2 (two) times daily. For chronic chest pain        Allergies: Allergies[1]  Family History: Family History  Problem Relation Age of Onset   Stroke Mother    Hypertension Mother    Diabetes Mother    Hypertension Father    Heart disease Sister        MI   Multiple sclerosis Daughter    Multiple sclerosis Son    Cerebral aneurysm Son    Seizures Son    Cerebral aneurysm Son    Breast cancer Paternal Aunt 62    Social History:  reports that she quit smoking about 26 years ago. Her smoking use included cigarettes. She has been exposed to  tobacco smoke. She has never used smokeless tobacco. She reports that she does not drink alcohol and does not use drugs.  ROS:                                        Physical Exam: There were no vitals taken for this visit.  Constitutional:  Alert and oriented, No acute distress. HEENT: [place] AT, moist mucus membranes.  Trachea midline, no masses.   Laboratory Data: Lab Results  Component Value Date   WBC 5.9 06/05/2024   HGB 9.9 (L) 06/20/2024   HCT 29.0 (L) 06/20/2024   MCV 90.7 06/05/2024   PLT 266 06/05/2024    Lab Results  Component Value Date   CREATININE 1.50 (H) 06/20/2024    No results found for: PSA  No results found for: TESTOSTERONE  Lab Results  Component Value Date   HGBA1C 5.9 07/19/2016    Urinalysis    Component Value Date/Time   COLORURINE STRAW (A) 06/13/2021 0034   APPEARANCEUR Slightly cloudy 08/06/2024 1304   LABSPEC 1.004 (L) 06/13/2021 0034   LABSPEC 1.006 03/27/2014 0944   PHURINE 6.0 06/13/2021 0034   GLUCOSEU Negative 08/06/2024 1304   GLUCOSEU 50 mg/dL 93/96/7984 9055   HGBUR SMALL (A) 06/13/2021 0034   BILIRUBINUR Negative 08/06/2024 1304   BILIRUBINUR Negative 03/27/2014 0944   KETONESUR NEGATIVE 06/13/2021 0034   PROTEINUR 2+ (A) 08/06/2024 1304   PROTEINUR 30 (A) 06/13/2021 0034   NITRITE Negative 08/06/2024 1304   NITRITE NEGATIVE 06/13/2021 0034   LEUKOCYTESUR 1+ (A) 08/06/2024 1304   LEUKOCYTESUR NEGATIVE 06/13/2021 0034   LEUKOCYTESUR Negative 03/27/2014 0944    Pertinent Imaging: Urine reviewed and sent  for culture  Assessment & Plan: Patient has had a refractory overactive bladder.  Multiple times she has had a trace of red blood cells but no significant amount.  She did not want cystoscopy.  She is a partial responder to Myrbetriq .  I discussed 3 refractory treatments with full template to the patient with her daughter from [place]  on the phone.  I think they made a good choice.  She will stay  on Myrbetriq  and start percutaneous tibial nerve stimulation.  Hopefully she reaches her treatment goal and call if culture positive  Based upon age and symptom severity I think she made an excellent choice  1. Urgency incontinence (Primary)  - Urinalysis, Complete   No follow-ups on file.  Glendia DELENA Elizabeth, MD  [facility] [address] [place], [place] [address] [phone]     [1]  Allergies Allergen Reactions   Celexa  [Citalopram ] Anaphylaxis    Throat closing    Dilaudid  [Hydromorphone  Hcl] Nausea And Vomiting   Effient [Prasugrel] Itching   Hydrochlorothiazide  Other (See Comments)    Decreased GFR (Nov 2015)   Liothyronine      Hair fell out, caused headaches    Nsaids     CKD stage III - avoid nephrotoxic drugs   Nubain [Nalbuphine Hcl]     Burning sensation in back   Penicillins Itching   Statins Itching   "

## 2024-10-29 NOTE — Telephone Encounter (Signed)
 Called patient on 10/29/2024 at 5:00pm to get scheduled for PTNS no answer. Left message to call back.  No PA needed for PTNS, Patient has Medicare Part A and B

## 2024-10-29 NOTE — Progress Notes (Unsigned)
 PROVIDER NOTE: Interpretation of information contained herein should be left to medically-trained personnel. Specific patient instructions are provided elsewhere under Patient Instructions section of medical record. This document was created in part using AI and STT-dictation technology, any transcriptional errors that may result from this process are unintentional.  Patient: Sabrina [name]  Service: E/M   PCP: [name], MD  DOB: 12-04-1940  DOS: 10/31/2024  Provider: Emmy MARLA Blanch, NP  MRN: [id number]  Delivery: Face-to-face  Specialty: Interventional Pain Management  Type: Established Patient  Setting: Ambulatory outpatient facility  Specialty designation: 09  Referring Prov.: [name], MD  Location: Outpatient office facility       History of present illness (HPI) Sabrina [name], a 84 y.o. year old female, is here today because of her No primary diagnosis found.. Sabrina [name] primary complain today is No chief complaint on file.  Pertinent problems: Sabrina [name] has Hip pain, bilateral; Essential hypertension; Generalized anxiety disorder; Chronic right hip pain; Cervical spondylosis without myelopathy; Chronic radicular lumbar pain; Lumbar stenosis with neurogenic claudication; Osteoarthritis of right hip; Anemia of chronic kidney failure, stage 3 (moderate) (HCC); Chronic pain syndrome; Lumbar facet arthropathy; Polypharmacy; Rotator cuff impingement syndrome of left shoulder; and MGUS (monoclonal gammopathy of unknown significance) on their pertinent problem list.  Pain Assessment: Severity of   is reported as a  /10. Location:    / . Onset:  . Quality:  . Timing:  . Modifying factor(s):  SABRA Vitals:  vitals were not taken for this visit.  BMI: Estimated body mass index is 31.64 kg/m as calculated from the following:   Height as of 08/22/24: 5' (1.524 m).   Weight as of 08/22/24: 162 lb (73.5 kg).  Last encounter: 07/26/2024. Last procedure: Visit date not  found.  Reason for encounter: post-procedure evaluation and assessment.   Discussed the use of AI scribe software for clinical note transcription with the patient, who gave verbal consent to proceed.  History of Present Illness          Procedure Procedure: Lumbar Facet, Medial Branch Radiofrequency Ablation (RFA) #1  Laterality: Bilateral (-50)  Level: L3, L4, and L5 Medial Branch Level(s). These levels will denervate the L3-4 and L4-5 lumbar facet joints.  Imaging: Fluoroscopy-guided         Anesthesia: Local anesthesia (1-2% Lidocaine ) Sedation: Moderate Sedation                       DOS: 08/22/2024  Performed by: Wallie Sherry, MD   Purpose: Therapeutic/Palliative Indications: Low back pain severe enough to impact quality of life or function. Indications: 1. Lumbar facet arthropathy   2. Lumbar spondylosis   3. Chronic pain syndrome     Sabrina [name] has been dealing with the above chronic pain for longer than three months and has either failed to respond, was unable to tolerate, or simply did not get enough benefit from other more conservative therapies including, but not limited to: 1. Over-the-counter medications 2. Anti-inflammatory medications 3. Muscle relaxants 4. Membrane stabilizers 5. Opioids 6. Physical therapy and/or chiropractic manipulation 7. Modalities (Heat, ice, etc.) 8. Invasive techniques such as nerve blocks. Sabrina [name] has attained more than 50% relief of the pain from a series of diagnostic injections conducted in separate occasions.   Pain Score: Pre-procedure: 10-Worst pain ever/10 Post-procedure: 0-No pain/10   Pharmacotherapy Assessment   Monitoring: [place] PMP: PDMP reviewed during this encounter.       Pharmacotherapy: No  side-effects or adverse reactions reported. Compliance: No problems identified. Effectiveness: Clinically acceptable.  No notes on file  UDS:  Summary  Date Value Ref Range Status  05/01/2024 FINAL  Final     Comment:    ==================================================================== [employer] Select 13 (MW) ==================================================================== Test                             Result       Flag       Units  Drug Present not Declared for Prescription Verification   Buprenorphine                   11           UNEXPECTED ng/mg creat   Norbuprenorphine               >1852        UNEXPECTED ng/mg creat    Source of buprenorphine  is a scheduled prescription medication.    Norbuprenorphine is an expected metabolite of buprenorphine .  Drug Absent but Declared for Prescription Verification   Hydrocodone                     Not Detected UNEXPECTED ng/mg creat ==================================================================== Test                      Result    Flag   Units      Ref Range   Creatinine              54               mg/dL      >=79 ==================================================================== Declared Medications:  The flagging and interpretation on this report are based on the  following declared medications.  Unexpected results may arise from  inaccuracies in the declared medications.   **Note: The testing scope of this panel includes these medications:   Hydrocodone  (Norco)   **Note: The testing scope of this panel does not include the  following reported medications:   Acetaminophen  (Norco)  Amlodipine  (Norvasc )  Aspirin   Clopidogrel  (Plavix )  Duloxetine  (Cymbalta )  Ezetimibe  (Zetia )  Furosemide  (Lasix )  Hydralazine  (Apresoline )  Hydroxyzine  (Vistaril )  Iron  Normodyne   Topical  Vitamin C  ==================================================================== For clinical consultation, please call [phone]. ====================================================================     No results found for: CBDTHCR No results found for: D8THCCBX No results found for: D9THCCBX  ROS  Constitutional: Denies any fever  or chills Gastrointestinal: No reported hemesis, hematochezia, vomiting, or acute GI distress Musculoskeletal: Denies any acute onset joint swelling, redness, loss of ROM, or weakness Neurological: No reported episodes of acute onset apraxia, aphasia, dysarthria, agnosia, amnesia, paralysis, loss of coordination, or loss of consciousness  Medication Review  amLODipine , aspirin , clopidogrel , ezetimibe , ferrous sulfate  ER, furosemide , hydrALAZINE , labetalol , losartan , methocarbamol , mirabegron  ER, nortriptyline , oxybutynin , pantoprazole , and ranolazine   History Review  Allergy: Ms. Pingleton is allergic to celexa  [citalopram ], dilaudid  [hydromorphone  hcl], effient [prasugrel], hydrochlorothiazide , liothyronine , nsaids, nubain [nalbuphine hcl], penicillins, and statins. Drug: Ms. Bellizzi  reports no history of drug use. Alcohol:  reports no history of alcohol use. Tobacco:  reports that she quit smoking about 26 years ago. Her smoking use included cigarettes. She has been exposed to tobacco smoke. She has never used smokeless tobacco. Social: Ms. Cansler  reports that she quit smoking about 26 years ago. Her smoking use included cigarettes. She has been exposed to tobacco smoke. She has never used smokeless tobacco.  She reports that she does not drink alcohol and does not use drugs. 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 Surgical: Ms. Jaskolski  has a past surgical history that includes Laparoscopic cholecystectomy (Left, 10/26/1999); Tonsillectomy and adenoidectomy; Abdominal hysterectomy (1976); Total hip arthroplasty (Left, 2005); Carotid angioplasty (Left); Coronary angioplasty with stent (2000); Toe amputation (Right); Cystoscopy with stent placement (Bilateral); Total hip arthroplasty (Right, 2015); Coronary artery bypass graft (N/A, 03/29/2000); Carotid endarterectomy (Left, 09/09/2003); Renal artery angioplasty (Bilateral, 12/2013); Colonoscopy with propofol  (N/A, 08/16/2017); Esophagogastroduodenoscopy (egd) with propofol  (N/A, 08/16/2017); Esophagogastroduodenoscopy (egd) with propofol  (N/A, 06/29/2018); LEFT HEART CATH AND CORONARY ANGIOGRAPHY (N/A, 11/18/2021); Upper Extremity Angiography (Left, 11/23/2021); Lower Extremity Angiography (Right, 01/06/2022); LEFT HEART CATH AND CORS/GRAFTS ANGIOGRAPHY (Left, 11/19/2002); LEFT HEART CATH AND CORS/GRAFTS ANGIOGRAPHY (Left, 09/17/2003); Endarterectomy femoral (Bilateral, 03/17/2022); Insertion of iliac stent (Left, 03/17/2022); Groin debridement (Right, 03/30/2022); Endarterectomy femoral (Right, 03/30/2022); ABDOMINAL AORTOGRAM W/LOWER EXTREMITY (N/A, 10/06/2022); PERIPHERAL VASCULAR BALLOON ANGIOPLASTY (10/06/2022); ABDOMINAL AORTOGRAM W/LOWER EXTREMITY (N/A, 05/04/2023); ABDOMINAL AORTOGRAM W/LOWER EXTREMITY (N/A, 06/20/2024); and RENAL INTERVENTION (Bilateral, 06/20/2024). Family: family history includes Breast  cancer (age of onset: 1) in her paternal aunt; Cerebral aneurysm in her son and son; Diabetes in her mother; Heart disease in her sister; Hypertension in her father and mother; Multiple sclerosis in her daughter and son; Seizures in her son; Stroke in her mother.  Laboratory Chemistry Profile   Renal Lab Results  Component Value Date   BUN 18 06/20/2024   CREATININE 1.50 (H) 06/20/2024   LABCREA 86 03/06/2018   GFR 32.48 (L) 08/25/2023   GFRAA 50 (L) 03/10/2020   GFRNONAA 28 (L) 06/05/2024    Hepatic Lab Results  Component Value Date   AST 19 06/05/2024   ALT 13 06/05/2024   ALBUMIN  3.9 06/05/2024   ALKPHOS 86 06/05/2024   LIPASE 16.0 07/06/2017    Electrolytes Lab Results  Component Value Date   NA 139 06/20/2024   K 3.6 06/20/2024   CL 107 06/20/2024   CALCIUM  9.5 06/05/2024   MG 2.5 (H) 03/31/2022   PHOS 3.8 09/23/2022    Bone No results found for: VD25OH, VD125OH2TOT, CI6874NY7, CI7874NY7, 25OHVITD1, 25OHVITD2, 25OHVITD3, TESTOFREE, TESTOSTERONE  Inflammation (CRP: Acute Phase) (ESR: Chronic Phase) Lab Results  Component Value Date   CRP <1.0 02/17/2023   ESRSEDRATE 53 (H) 02/17/2023   LATICACIDVEN 1.0 03/30/2022         Note: Above Lab results reviewed.  Recent Imaging Review  DG PAIN CLINIC C-ARM 1-60 MIN NO REPORT Fluoro was used, but no Radiologist interpretation will be provided.  Please refer to NOTES tab for provider progress note. Note: Reviewed        Physical Exam  Vitals: There were no vitals taken for this visit. BMI: Estimated body mass index is 31.64  kg/m as calculated from the following:   Height as of 08/22/24: 5' (1.524 m).   Weight as of 08/22/24: 162 lb (73.5 kg). Ideal: Patient weight not recorded General appearance: Well nourished, well developed, and well hydrated. In no apparent acute distress Mental status: Alert, oriented x 3 (person, place, & time)       Respiratory: No evidence of acute respiratory  distress Eyes: PERLA   Assessment   Diagnosis Status  No diagnosis found. Controlled Controlled Controlled   Updated Problems: No problems updated.  Plan of Care  Problem-specific:  Assessment and Plan            Ms. Gionni A Sarff has a current medication list which includes the following long-term medication(s): amlodipine , ezetimibe , ferrous sulfate  er, furosemide , hydralazine , labetalol , losartan , mirabegron  er, nortriptyline , pantoprazole , and ranolazine .  Pharmacotherapy (Medications Ordered): No orders of the defined types were placed in this encounter.  Orders:  No orders of the defined types were placed in this encounter.    {There is no content from the last Plan section.}   No follow-ups on file.    Recent Visits Date Type Provider Dept  08/22/24 Procedure visit Marcelino Nurse, MD [facility]  08/08/24 Procedure visit Marcelino Nurse, MD [facility]  Showing recent visits within past 90 days and meeting all other requirements Future Appointments Date Type Provider Dept  10/31/24 Appointment Amiyah Shryock K, NP [facility]  Showing future appointments within next 90 days and meeting all other requirements  I discussed the assessment and treatment plan with the patient. The patient was provided an opportunity to ask questions and all were answered. The patient agreed with the plan and demonstrated an understanding of the instructions.  Patient advised to call back or seek an in-person evaluation if the symptoms or condition worsens.  Duration of encounter: *** minutes.  Total time on encounter, as per AMA guidelines included both the face-to-face and non-face-to-face time personally spent by the physician and/or other qualified health care professional(s) on the day of the encounter (includes time in activities that require the physician or other qualified health care professional and does not include time in activities normally  performed by clinical staff). Physician's time may include the following activities when performed: Preparing to see the patient (e.g., pre-charting review of records, searching for previously ordered imaging, lab work, and nerve conduction tests) Review of prior analgesic pharmacotherapies. Reviewing PMP Interpreting ordered tests (e.g., lab work, imaging, nerve conduction tests) Performing post-procedure evaluations, including interpretation of diagnostic procedures Obtaining and/or reviewing separately obtained history Performing a medically appropriate examination and/or evaluation Counseling and educating the patient/family/caregiver Ordering medications, tests, or procedures Referring and communicating with other health care professionals (when not separately reported) Documenting clinical information in the electronic or other health record Independently interpreting results (not separately reported) and communicating results to the patient/ family/caregiver Care coordination (not separately reported)  Note by: Niti Leisure K Tayshun Gappa, NP (TTS and AI technology used. I apologize for any typographical errors that were not detected and corrected.) Date: 10/31/2024; Time: 1:11 PM

## 2024-10-30 ENCOUNTER — Encounter: Payer: Self-pay | Admitting: Internal Medicine

## 2024-10-30 DIAGNOSIS — I10 Essential (primary) hypertension: Secondary | ICD-10-CM

## 2024-10-30 LAB — MICROSCOPIC EXAMINATION: Epithelial Cells (non renal): >10 /HPF — AB (ref 0–10)

## 2024-10-30 LAB — URINALYSIS, COMPLETE
Bilirubin, UA: NEGATIVE
Glucose, UA: NEGATIVE
Ketones, UA: NEGATIVE
Leukocytes,UA: NEGATIVE
Nitrite, UA: NEGATIVE
Specific Gravity, UA: 1.025 (ref 1.005–1.030)
Urobilinogen, Ur: 0.2 mg/dL (ref 0.2–1.0)
pH, UA: 6 (ref 5.0–7.5)

## 2024-10-31 ENCOUNTER — Ambulatory Visit: Attending: Nurse Practitioner | Admitting: Nurse Practitioner

## 2024-10-31 ENCOUNTER — Telehealth: Payer: Self-pay

## 2024-10-31 ENCOUNTER — Encounter: Payer: Self-pay | Admitting: Nurse Practitioner

## 2024-10-31 VITALS — BP 136/44 | HR 67 | Temp 98.1°F | Resp 18 | Ht 60.0 in | Wt 167.0 lb

## 2024-10-31 DIAGNOSIS — I739 Peripheral vascular disease, unspecified: Secondary | ICD-10-CM | POA: Diagnosis present

## 2024-10-31 DIAGNOSIS — Z79899 Other long term (current) drug therapy: Secondary | ICD-10-CM | POA: Diagnosis present

## 2024-10-31 DIAGNOSIS — Z7189 Other specified counseling: Secondary | ICD-10-CM | POA: Insufficient documentation

## 2024-10-31 DIAGNOSIS — Z0289 Encounter for other administrative examinations: Secondary | ICD-10-CM | POA: Diagnosis present

## 2024-10-31 DIAGNOSIS — M79652 Pain in left thigh: Secondary | ICD-10-CM | POA: Diagnosis present

## 2024-10-31 DIAGNOSIS — G8929 Other chronic pain: Secondary | ICD-10-CM | POA: Diagnosis present

## 2024-10-31 DIAGNOSIS — M47816 Spondylosis without myelopathy or radiculopathy, lumbar region: Secondary | ICD-10-CM | POA: Diagnosis present

## 2024-10-31 DIAGNOSIS — Z96643 Presence of artificial hip joint, bilateral: Secondary | ICD-10-CM | POA: Insufficient documentation

## 2024-10-31 DIAGNOSIS — Z95828 Presence of other vascular implants and grafts: Secondary | ICD-10-CM | POA: Diagnosis present

## 2024-10-31 DIAGNOSIS — G894 Chronic pain syndrome: Secondary | ICD-10-CM | POA: Insufficient documentation

## 2024-10-31 DIAGNOSIS — M5416 Radiculopathy, lumbar region: Secondary | ICD-10-CM | POA: Insufficient documentation

## 2024-10-31 MED ORDER — AMLODIPINE BESYLATE 10 MG PO TABS: 10.0000 mg | ORAL_TABLET | Freq: Every day | ORAL | 1 refills | Status: AC

## 2024-10-31 MED ORDER — EZETIMIBE 10 MG PO TABS: 10.0000 mg | ORAL_TABLET | Freq: Every day | ORAL | 1 refills | Status: AC

## 2024-10-31 NOTE — Progress Notes (Signed)
 Nursing Pain Medication Assessment:  Safety precautions to be maintained throughout the outpatient stay will include: orient to surroundings, keep bed in low position, maintain call bell within reach at all times, provide assistance with transfer out of bed and ambulation.  Medication Inspection Compliance: Pill count conducted under aseptic conditions, in front of the patient. Neither the pills nor the bottle was removed from the patient's sight at any time. Once count was completed pills were immediately returned to the patient in their original bottle.  Medication: Oxycodone /APAP Pill/Patch Count: 97 of 90 pills/patches remain Pill/Patch Appearance: Markings consistent with prescribed medication Bottle Appearance: Standard pharmacy container. Clearly labeled. Filled Date: 10 / 03 / 2025 Last Medication intake:  10/16/24  Pill count x 3 and rechecked with Harg[place], RN

## 2024-10-31 NOTE — Telephone Encounter (Signed)
 Called patient and got patient scheduled for PTNS

## 2024-10-31 NOTE — Progress Notes (Signed)
 Nursing Pain Medication Assessment:  Safety precautions to be maintained throughout the outpatient stay will include: orient to surroundings, keep bed in low position, maintain call bell within reach at all times, provide assistance with transfer out of bed and ambulation.  Medication Inspection Compliance: Pill count conducted under aseptic conditions, in front of the patient. Neither the pills nor the bottle was removed from the patient's sight at any time. Once count was completed pills were immediately returned to the patient in their original bottle.  Medication: Hydrocodone /APAP Pill/Patch Count: 63 of 90 pills/patches remain Pill/Patch Appearance: Markings consistent with prescribed medication Bottle Appearance: Standard pharmacy container. Clearly labeled. Filled Date: 07 / 08 / 2025 Last Medication intake:  10/11/2024

## 2024-10-31 NOTE — Telephone Encounter (Signed)
-----   Message from [facility] M sent at 10/29/2024  1:56 PM EST ----- Regarding: PTNS Dr.MacDiarmid would like to for her to start PTNS

## 2024-11-01 LAB — CULTURE, URINE COMPREHENSIVE

## 2024-11-06 ENCOUNTER — Other Ambulatory Visit: Payer: Self-pay | Admitting: Internal Medicine

## 2024-11-06 ENCOUNTER — Encounter: Payer: Self-pay | Admitting: Internal Medicine

## 2024-11-06 MED ORDER — HYDRALAZINE HCL 50 MG PO TABS: ORAL_TABLET | ORAL | 0 refills | Status: AC

## 2024-11-08 ENCOUNTER — Ambulatory Visit (INDEPENDENT_AMBULATORY_CARE_PROVIDER_SITE_OTHER): Admitting: Physician Assistant

## 2024-11-08 DIAGNOSIS — N3941 Urge incontinence: Secondary | ICD-10-CM

## 2024-11-08 NOTE — Progress Notes (Signed)
 PTNS  Session # 1  Health & Social Factors: no change Caffeine : 1.5 Alcohol: 0 Daytime voids #per day: 2 Night-time voids #per night: 2 Urgency: mild Incontinence Episodes #per day: 1 Ankle used: left Treatment Setting: 5 Feeling/ Response: sensory Comments: Patient tolerated well. Consent signed.  Performed By: Tanji Storrs, PA-C   Follow Up: 1 week

## 2024-11-08 NOTE — Patient Instructions (Signed)

## 2024-11-15 ENCOUNTER — Ambulatory Visit (INDEPENDENT_AMBULATORY_CARE_PROVIDER_SITE_OTHER): Admitting: Urology

## 2024-11-15 DIAGNOSIS — N3941 Urge incontinence: Secondary | ICD-10-CM | POA: Diagnosis not present

## 2024-11-15 NOTE — Progress Notes (Signed)
 PTNS  Session # 2  Health & Social Factors: no change Caffeine : 1 a week Alcohol: 0 Daytime voids #per day: 4 Night-time voids #per night: 4 Urgency: none Incontinence Episodes #per day: 0 Ankle used: left Treatment Setting: 4 Feeling/ Response: sensory Comments: n/a  Performed By: Mathew Pinal, RN  Follow Up: 1 week

## 2024-11-15 NOTE — Patient Instructions (Signed)

## 2024-11-22 ENCOUNTER — Ambulatory Visit (INDEPENDENT_AMBULATORY_CARE_PROVIDER_SITE_OTHER): Admitting: Urology

## 2024-11-22 DIAGNOSIS — N3941 Urge incontinence: Secondary | ICD-10-CM | POA: Diagnosis not present

## 2024-11-22 NOTE — Progress Notes (Signed)
 PTNS  Session # 3  Health & Social Factors: no change Caffeine : 1 Alcohol: 0 Daytime voids #per day: 4 Night-time voids #per night: 3 Urgency: none Incontinence Episodes #per day: 0 Ankle used: left Treatment Setting: 4 Feeling/ Response: sensory Comments: n/a  Performed By: Mathew pinal, RN  Follow Up: 1 week

## 2024-11-22 NOTE — Patient Instructions (Signed)

## 2024-11-28 ENCOUNTER — Other Ambulatory Visit: Payer: Self-pay

## 2024-11-28 DIAGNOSIS — N3941 Urge incontinence: Secondary | ICD-10-CM

## 2024-11-28 DIAGNOSIS — R32 Unspecified urinary incontinence: Secondary | ICD-10-CM

## 2024-11-29 ENCOUNTER — Ambulatory Visit (INDEPENDENT_AMBULATORY_CARE_PROVIDER_SITE_OTHER): Admitting: Physician Assistant

## 2024-11-29 DIAGNOSIS — N3941 Urge incontinence: Secondary | ICD-10-CM

## 2024-11-29 NOTE — Progress Notes (Signed)
 PTNS  Session # 4  Health & Social Factors: no change Caffeine : 1 Alcohol: 0 Daytime voids #per day: 4-6 Night-time voids #per night: 3-4 Urgency: none Incontinence Episodes #per day: 0 Ankle used: left Treatment Setting: 7 Feeling/ Response: sensory Comments: Patient tolerated well  Performed By: Lucie Hones, PA-C   Follow Up: 1 week

## 2024-11-29 NOTE — Patient Instructions (Signed)

## 2024-12-04 ENCOUNTER — Ambulatory Visit: Admitting: Physician Assistant

## 2024-12-06 ENCOUNTER — Ambulatory Visit: Admitting: Physician Assistant

## 2024-12-11 ENCOUNTER — Other Ambulatory Visit

## 2024-12-13 ENCOUNTER — Ambulatory Visit: Admitting: Physician Assistant

## 2024-12-20 ENCOUNTER — Ambulatory Visit: Admitting: Oncology

## 2024-12-20 ENCOUNTER — Ambulatory Visit: Admitting: Physician Assistant

## 2024-12-20 ENCOUNTER — Ambulatory Visit

## 2024-12-25 ENCOUNTER — Encounter: Admitting: Nurse Practitioner

## 2024-12-26 ENCOUNTER — Encounter: Admitting: Nurse Practitioner

## 2024-12-27 ENCOUNTER — Ambulatory Visit

## 2025-01-03 ENCOUNTER — Ambulatory Visit

## 2025-01-10 ENCOUNTER — Ambulatory Visit

## 2025-01-17 ENCOUNTER — Ambulatory Visit

## 2025-01-24 ENCOUNTER — Ambulatory Visit

## 2025-01-31 ENCOUNTER — Encounter (HOSPITAL_COMMUNITY)

## 2025-01-31 ENCOUNTER — Ambulatory Visit: Admitting: Vascular Surgery

## 2025-05-21 ENCOUNTER — Ambulatory Visit
